# Patient Record
Sex: Female | Born: 1935 | Race: White | Hispanic: No | Smoking: Former smoker
Health system: Southern US, Community
[De-identification: ages and names within clinical notes are randomized; demographics above are authoritative.]

## PROBLEM LIST (undated history)

## (undated) DIAGNOSIS — C801 Malignant (primary) neoplasm, unspecified: Secondary | ICD-10-CM

## (undated) DIAGNOSIS — G4733 Obstructive sleep apnea (adult) (pediatric): Secondary | ICD-10-CM

## (undated) DIAGNOSIS — D649 Anemia, unspecified: Secondary | ICD-10-CM

## (undated) DIAGNOSIS — H8109 Meniere's disease, unspecified ear: Secondary | ICD-10-CM

## (undated) DIAGNOSIS — I2510 Atherosclerotic heart disease of native coronary artery without angina pectoris: Secondary | ICD-10-CM

## (undated) DIAGNOSIS — Z95828 Presence of other vascular implants and grafts: Secondary | ICD-10-CM

## (undated) DIAGNOSIS — F32A Depression, unspecified: Secondary | ICD-10-CM

## (undated) DIAGNOSIS — I219 Acute myocardial infarction, unspecified: Secondary | ICD-10-CM

## (undated) DIAGNOSIS — K589 Irritable bowel syndrome without diarrhea: Secondary | ICD-10-CM

## (undated) DIAGNOSIS — Z950 Presence of cardiac pacemaker: Secondary | ICD-10-CM

## (undated) DIAGNOSIS — Z992 Dependence on renal dialysis: Secondary | ICD-10-CM

## (undated) DIAGNOSIS — F329 Major depressive disorder, single episode, unspecified: Secondary | ICD-10-CM

## (undated) DIAGNOSIS — I509 Heart failure, unspecified: Secondary | ICD-10-CM

## (undated) DIAGNOSIS — I429 Cardiomyopathy, unspecified: Secondary | ICD-10-CM

## (undated) DIAGNOSIS — N19 Unspecified kidney failure: Secondary | ICD-10-CM

## (undated) DIAGNOSIS — F419 Anxiety disorder, unspecified: Secondary | ICD-10-CM

## (undated) DIAGNOSIS — M1990 Unspecified osteoarthritis, unspecified site: Secondary | ICD-10-CM

## (undated) DIAGNOSIS — N289 Disorder of kidney and ureter, unspecified: Secondary | ICD-10-CM

## (undated) HISTORY — PX: CATARACT EXTRACTION: SUR2

## (undated) HISTORY — PX: JOINT REPLACEMENT: SHX530

## (undated) HISTORY — DX: Heart failure, unspecified: I50.9

## (undated) HISTORY — PX: EYE SURGERY: SHX253

## (undated) HISTORY — DX: Unspecified kidney failure: N19

## (undated) HISTORY — PX: ABDOMINAL HYSTERECTOMY: SHX81

## (undated) HISTORY — DX: Meniere's disease, unspecified ear: H81.09

## (undated) HISTORY — PX: INSERT / REPLACE / REMOVE PACEMAKER: SUR710

## (undated) HISTORY — DX: Dependence on renal dialysis: Z99.2

## (undated) HISTORY — DX: Anemia, unspecified: D64.9

---
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
## 2003-12-07 ENCOUNTER — Ambulatory Visit: Payer: Self-pay | Admitting: Internal Medicine

## 2004-01-27 HISTORY — PX: PACEMAKER INSERTION: SHX728

## 2004-06-24 ENCOUNTER — Ambulatory Visit: Payer: Self-pay | Admitting: Ophthalmology

## 2004-07-01 ENCOUNTER — Ambulatory Visit: Payer: Self-pay | Admitting: Ophthalmology

## 2004-07-23 ENCOUNTER — Ambulatory Visit: Payer: Self-pay | Admitting: Internal Medicine

## 2004-10-08 ENCOUNTER — Ambulatory Visit: Payer: Self-pay | Admitting: Internal Medicine

## 2004-10-21 ENCOUNTER — Inpatient Hospital Stay: Payer: Self-pay | Admitting: Cardiology

## 2004-10-21 ENCOUNTER — Other Ambulatory Visit: Payer: Self-pay

## 2004-10-22 ENCOUNTER — Other Ambulatory Visit: Payer: Self-pay

## 2005-03-12 ENCOUNTER — Ambulatory Visit: Payer: Self-pay | Admitting: Internal Medicine

## 2005-06-30 ENCOUNTER — Ambulatory Visit: Payer: Self-pay | Admitting: Unknown Physician Specialty

## 2005-07-17 ENCOUNTER — Ambulatory Visit: Payer: Self-pay | Admitting: Surgery

## 2005-07-27 ENCOUNTER — Ambulatory Visit: Payer: Self-pay | Admitting: Surgery

## 2006-01-26 HISTORY — PX: TOTAL KNEE ARTHROPLASTY: SHX125

## 2006-05-11 ENCOUNTER — Ambulatory Visit: Payer: Self-pay | Admitting: Internal Medicine

## 2006-06-02 ENCOUNTER — Other Ambulatory Visit: Payer: Self-pay

## 2006-06-02 ENCOUNTER — Ambulatory Visit: Payer: Self-pay | Admitting: Unknown Physician Specialty

## 2006-06-15 ENCOUNTER — Inpatient Hospital Stay: Payer: Self-pay | Admitting: Unknown Physician Specialty

## 2007-04-30 ENCOUNTER — Emergency Department: Payer: Self-pay | Admitting: Emergency Medicine

## 2007-07-20 ENCOUNTER — Ambulatory Visit: Payer: Self-pay | Admitting: Internal Medicine

## 2007-10-11 ENCOUNTER — Ambulatory Visit: Payer: Self-pay | Admitting: Unknown Physician Specialty

## 2008-01-27 DIAGNOSIS — K589 Irritable bowel syndrome without diarrhea: Secondary | ICD-10-CM

## 2008-01-27 HISTORY — PX: CHOLECYSTECTOMY: SHX55

## 2008-01-27 HISTORY — PX: ANUS SURGERY: SHX302

## 2008-01-27 HISTORY — DX: Irritable bowel syndrome, unspecified: K58.9

## 2008-02-17 ENCOUNTER — Inpatient Hospital Stay: Payer: Self-pay | Admitting: Internal Medicine

## 2008-06-18 ENCOUNTER — Ambulatory Visit: Payer: Self-pay | Admitting: Internal Medicine

## 2008-08-23 ENCOUNTER — Ambulatory Visit: Payer: Self-pay | Admitting: Internal Medicine

## 2008-08-28 ENCOUNTER — Ambulatory Visit: Payer: Self-pay | Admitting: Internal Medicine

## 2009-03-01 ENCOUNTER — Ambulatory Visit: Payer: Self-pay | Admitting: Internal Medicine

## 2009-07-23 ENCOUNTER — Ambulatory Visit: Payer: Self-pay | Admitting: Ophthalmology

## 2009-07-30 ENCOUNTER — Ambulatory Visit: Payer: Self-pay | Admitting: Ophthalmology

## 2009-08-01 LAB — HM COLONOSCOPY: HM Colonoscopy: NORMAL

## 2009-11-08 ENCOUNTER — Ambulatory Visit: Payer: Self-pay | Admitting: Internal Medicine

## 2010-06-25 ENCOUNTER — Ambulatory Visit: Payer: Self-pay | Admitting: Internal Medicine

## 2010-07-27 DIAGNOSIS — N19 Unspecified kidney failure: Secondary | ICD-10-CM

## 2010-07-27 HISTORY — PX: INSERTION OF DIALYSIS CATHETER: SHX1324

## 2010-07-27 HISTORY — DX: Unspecified kidney failure: N19

## 2010-08-04 ENCOUNTER — Ambulatory Visit: Payer: Self-pay | Admitting: Vascular Surgery

## 2011-01-07 ENCOUNTER — Ambulatory Visit: Payer: Self-pay | Admitting: Internal Medicine

## 2011-01-27 DIAGNOSIS — N186 End stage renal disease: Secondary | ICD-10-CM | POA: Diagnosis not present

## 2011-01-27 DIAGNOSIS — D509 Iron deficiency anemia, unspecified: Secondary | ICD-10-CM | POA: Diagnosis not present

## 2011-01-28 DIAGNOSIS — N186 End stage renal disease: Secondary | ICD-10-CM | POA: Diagnosis not present

## 2011-01-28 DIAGNOSIS — D509 Iron deficiency anemia, unspecified: Secondary | ICD-10-CM | POA: Diagnosis not present

## 2011-01-29 DIAGNOSIS — D509 Iron deficiency anemia, unspecified: Secondary | ICD-10-CM | POA: Diagnosis not present

## 2011-01-29 DIAGNOSIS — N186 End stage renal disease: Secondary | ICD-10-CM | POA: Diagnosis not present

## 2011-01-30 DIAGNOSIS — D509 Iron deficiency anemia, unspecified: Secondary | ICD-10-CM | POA: Diagnosis not present

## 2011-01-30 DIAGNOSIS — N186 End stage renal disease: Secondary | ICD-10-CM | POA: Diagnosis not present

## 2011-01-31 DIAGNOSIS — N186 End stage renal disease: Secondary | ICD-10-CM | POA: Diagnosis not present

## 2011-01-31 DIAGNOSIS — D509 Iron deficiency anemia, unspecified: Secondary | ICD-10-CM | POA: Diagnosis not present

## 2011-02-01 DIAGNOSIS — D509 Iron deficiency anemia, unspecified: Secondary | ICD-10-CM | POA: Diagnosis not present

## 2011-02-01 DIAGNOSIS — N186 End stage renal disease: Secondary | ICD-10-CM | POA: Diagnosis not present

## 2011-02-02 DIAGNOSIS — D509 Iron deficiency anemia, unspecified: Secondary | ICD-10-CM | POA: Diagnosis not present

## 2011-02-02 DIAGNOSIS — N186 End stage renal disease: Secondary | ICD-10-CM | POA: Diagnosis not present

## 2011-02-03 DIAGNOSIS — N186 End stage renal disease: Secondary | ICD-10-CM | POA: Diagnosis not present

## 2011-02-03 DIAGNOSIS — D509 Iron deficiency anemia, unspecified: Secondary | ICD-10-CM | POA: Diagnosis not present

## 2011-02-04 DIAGNOSIS — D509 Iron deficiency anemia, unspecified: Secondary | ICD-10-CM | POA: Diagnosis not present

## 2011-02-04 DIAGNOSIS — N186 End stage renal disease: Secondary | ICD-10-CM | POA: Diagnosis not present

## 2011-02-05 DIAGNOSIS — N186 End stage renal disease: Secondary | ICD-10-CM | POA: Diagnosis not present

## 2011-02-05 DIAGNOSIS — D509 Iron deficiency anemia, unspecified: Secondary | ICD-10-CM | POA: Diagnosis not present

## 2011-02-06 DIAGNOSIS — D509 Iron deficiency anemia, unspecified: Secondary | ICD-10-CM | POA: Diagnosis not present

## 2011-02-06 DIAGNOSIS — N186 End stage renal disease: Secondary | ICD-10-CM | POA: Diagnosis not present

## 2011-02-07 DIAGNOSIS — N186 End stage renal disease: Secondary | ICD-10-CM | POA: Diagnosis not present

## 2011-02-07 DIAGNOSIS — D509 Iron deficiency anemia, unspecified: Secondary | ICD-10-CM | POA: Diagnosis not present

## 2011-02-08 DIAGNOSIS — N186 End stage renal disease: Secondary | ICD-10-CM | POA: Diagnosis not present

## 2011-02-08 DIAGNOSIS — D509 Iron deficiency anemia, unspecified: Secondary | ICD-10-CM | POA: Diagnosis not present

## 2011-02-09 DIAGNOSIS — N186 End stage renal disease: Secondary | ICD-10-CM | POA: Diagnosis not present

## 2011-02-09 DIAGNOSIS — D509 Iron deficiency anemia, unspecified: Secondary | ICD-10-CM | POA: Diagnosis not present

## 2011-02-10 DIAGNOSIS — N186 End stage renal disease: Secondary | ICD-10-CM | POA: Diagnosis not present

## 2011-02-10 DIAGNOSIS — D509 Iron deficiency anemia, unspecified: Secondary | ICD-10-CM | POA: Diagnosis not present

## 2011-02-11 DIAGNOSIS — N186 End stage renal disease: Secondary | ICD-10-CM | POA: Diagnosis not present

## 2011-02-11 DIAGNOSIS — D509 Iron deficiency anemia, unspecified: Secondary | ICD-10-CM | POA: Diagnosis not present

## 2011-02-12 DIAGNOSIS — N186 End stage renal disease: Secondary | ICD-10-CM | POA: Diagnosis not present

## 2011-02-12 DIAGNOSIS — D509 Iron deficiency anemia, unspecified: Secondary | ICD-10-CM | POA: Diagnosis not present

## 2011-02-13 DIAGNOSIS — D509 Iron deficiency anemia, unspecified: Secondary | ICD-10-CM | POA: Diagnosis not present

## 2011-02-13 DIAGNOSIS — N186 End stage renal disease: Secondary | ICD-10-CM | POA: Diagnosis not present

## 2011-02-14 DIAGNOSIS — D509 Iron deficiency anemia, unspecified: Secondary | ICD-10-CM | POA: Diagnosis not present

## 2011-02-14 DIAGNOSIS — N186 End stage renal disease: Secondary | ICD-10-CM | POA: Diagnosis not present

## 2011-02-15 DIAGNOSIS — D509 Iron deficiency anemia, unspecified: Secondary | ICD-10-CM | POA: Diagnosis not present

## 2011-02-15 DIAGNOSIS — N186 End stage renal disease: Secondary | ICD-10-CM | POA: Diagnosis not present

## 2011-02-16 DIAGNOSIS — N186 End stage renal disease: Secondary | ICD-10-CM | POA: Diagnosis not present

## 2011-02-16 DIAGNOSIS — D509 Iron deficiency anemia, unspecified: Secondary | ICD-10-CM | POA: Diagnosis not present

## 2011-02-17 DIAGNOSIS — D509 Iron deficiency anemia, unspecified: Secondary | ICD-10-CM | POA: Diagnosis not present

## 2011-02-17 DIAGNOSIS — N186 End stage renal disease: Secondary | ICD-10-CM | POA: Diagnosis not present

## 2011-02-18 DIAGNOSIS — D509 Iron deficiency anemia, unspecified: Secondary | ICD-10-CM | POA: Diagnosis not present

## 2011-02-18 DIAGNOSIS — N186 End stage renal disease: Secondary | ICD-10-CM | POA: Diagnosis not present

## 2011-02-19 DIAGNOSIS — N186 End stage renal disease: Secondary | ICD-10-CM | POA: Diagnosis not present

## 2011-02-19 DIAGNOSIS — D509 Iron deficiency anemia, unspecified: Secondary | ICD-10-CM | POA: Diagnosis not present

## 2011-02-20 DIAGNOSIS — D509 Iron deficiency anemia, unspecified: Secondary | ICD-10-CM | POA: Diagnosis not present

## 2011-02-20 DIAGNOSIS — N186 End stage renal disease: Secondary | ICD-10-CM | POA: Diagnosis not present

## 2011-02-21 DIAGNOSIS — D509 Iron deficiency anemia, unspecified: Secondary | ICD-10-CM | POA: Diagnosis not present

## 2011-02-21 DIAGNOSIS — N186 End stage renal disease: Secondary | ICD-10-CM | POA: Diagnosis not present

## 2011-02-22 DIAGNOSIS — N186 End stage renal disease: Secondary | ICD-10-CM | POA: Diagnosis not present

## 2011-02-22 DIAGNOSIS — D509 Iron deficiency anemia, unspecified: Secondary | ICD-10-CM | POA: Diagnosis not present

## 2011-02-23 DIAGNOSIS — D509 Iron deficiency anemia, unspecified: Secondary | ICD-10-CM | POA: Diagnosis not present

## 2011-02-23 DIAGNOSIS — N186 End stage renal disease: Secondary | ICD-10-CM | POA: Diagnosis not present

## 2011-02-24 DIAGNOSIS — D509 Iron deficiency anemia, unspecified: Secondary | ICD-10-CM | POA: Diagnosis not present

## 2011-02-24 DIAGNOSIS — N186 End stage renal disease: Secondary | ICD-10-CM | POA: Diagnosis not present

## 2011-02-25 DIAGNOSIS — N186 End stage renal disease: Secondary | ICD-10-CM | POA: Diagnosis not present

## 2011-02-25 DIAGNOSIS — D509 Iron deficiency anemia, unspecified: Secondary | ICD-10-CM | POA: Diagnosis not present

## 2011-02-26 DIAGNOSIS — D509 Iron deficiency anemia, unspecified: Secondary | ICD-10-CM | POA: Diagnosis not present

## 2011-02-26 DIAGNOSIS — N186 End stage renal disease: Secondary | ICD-10-CM | POA: Diagnosis not present

## 2011-02-27 DIAGNOSIS — N186 End stage renal disease: Secondary | ICD-10-CM | POA: Diagnosis not present

## 2011-02-27 DIAGNOSIS — D509 Iron deficiency anemia, unspecified: Secondary | ICD-10-CM | POA: Diagnosis not present

## 2011-02-28 DIAGNOSIS — N186 End stage renal disease: Secondary | ICD-10-CM | POA: Diagnosis not present

## 2011-02-28 DIAGNOSIS — D509 Iron deficiency anemia, unspecified: Secondary | ICD-10-CM | POA: Diagnosis not present

## 2011-03-01 DIAGNOSIS — D509 Iron deficiency anemia, unspecified: Secondary | ICD-10-CM | POA: Diagnosis not present

## 2011-03-01 DIAGNOSIS — N186 End stage renal disease: Secondary | ICD-10-CM | POA: Diagnosis not present

## 2011-03-02 DIAGNOSIS — N186 End stage renal disease: Secondary | ICD-10-CM | POA: Diagnosis not present

## 2011-03-02 DIAGNOSIS — D509 Iron deficiency anemia, unspecified: Secondary | ICD-10-CM | POA: Diagnosis not present

## 2011-03-03 DIAGNOSIS — N186 End stage renal disease: Secondary | ICD-10-CM | POA: Diagnosis not present

## 2011-03-03 DIAGNOSIS — D509 Iron deficiency anemia, unspecified: Secondary | ICD-10-CM | POA: Diagnosis not present

## 2011-03-04 DIAGNOSIS — D509 Iron deficiency anemia, unspecified: Secondary | ICD-10-CM | POA: Diagnosis not present

## 2011-03-04 DIAGNOSIS — N186 End stage renal disease: Secondary | ICD-10-CM | POA: Diagnosis not present

## 2011-03-05 DIAGNOSIS — N186 End stage renal disease: Secondary | ICD-10-CM | POA: Diagnosis not present

## 2011-03-05 DIAGNOSIS — D509 Iron deficiency anemia, unspecified: Secondary | ICD-10-CM | POA: Diagnosis not present

## 2011-03-06 DIAGNOSIS — D509 Iron deficiency anemia, unspecified: Secondary | ICD-10-CM | POA: Diagnosis not present

## 2011-03-06 DIAGNOSIS — N186 End stage renal disease: Secondary | ICD-10-CM | POA: Diagnosis not present

## 2011-03-07 DIAGNOSIS — D509 Iron deficiency anemia, unspecified: Secondary | ICD-10-CM | POA: Diagnosis not present

## 2011-03-07 DIAGNOSIS — N186 End stage renal disease: Secondary | ICD-10-CM | POA: Diagnosis not present

## 2011-03-08 DIAGNOSIS — D509 Iron deficiency anemia, unspecified: Secondary | ICD-10-CM | POA: Diagnosis not present

## 2011-03-08 DIAGNOSIS — N186 End stage renal disease: Secondary | ICD-10-CM | POA: Diagnosis not present

## 2011-03-09 DIAGNOSIS — D509 Iron deficiency anemia, unspecified: Secondary | ICD-10-CM | POA: Diagnosis not present

## 2011-03-09 DIAGNOSIS — N186 End stage renal disease: Secondary | ICD-10-CM | POA: Diagnosis not present

## 2011-03-10 DIAGNOSIS — D509 Iron deficiency anemia, unspecified: Secondary | ICD-10-CM | POA: Diagnosis not present

## 2011-03-10 DIAGNOSIS — N186 End stage renal disease: Secondary | ICD-10-CM | POA: Diagnosis not present

## 2011-03-11 DIAGNOSIS — D509 Iron deficiency anemia, unspecified: Secondary | ICD-10-CM | POA: Diagnosis not present

## 2011-03-11 DIAGNOSIS — N186 End stage renal disease: Secondary | ICD-10-CM | POA: Diagnosis not present

## 2011-03-12 DIAGNOSIS — N186 End stage renal disease: Secondary | ICD-10-CM | POA: Diagnosis not present

## 2011-03-12 DIAGNOSIS — D509 Iron deficiency anemia, unspecified: Secondary | ICD-10-CM | POA: Diagnosis not present

## 2011-03-13 DIAGNOSIS — N186 End stage renal disease: Secondary | ICD-10-CM | POA: Diagnosis not present

## 2011-03-13 DIAGNOSIS — D509 Iron deficiency anemia, unspecified: Secondary | ICD-10-CM | POA: Diagnosis not present

## 2011-03-14 DIAGNOSIS — D509 Iron deficiency anemia, unspecified: Secondary | ICD-10-CM | POA: Diagnosis not present

## 2011-03-14 DIAGNOSIS — N186 End stage renal disease: Secondary | ICD-10-CM | POA: Diagnosis not present

## 2011-03-15 DIAGNOSIS — D509 Iron deficiency anemia, unspecified: Secondary | ICD-10-CM | POA: Diagnosis not present

## 2011-03-15 DIAGNOSIS — N186 End stage renal disease: Secondary | ICD-10-CM | POA: Diagnosis not present

## 2011-03-16 DIAGNOSIS — N186 End stage renal disease: Secondary | ICD-10-CM | POA: Diagnosis not present

## 2011-03-16 DIAGNOSIS — D509 Iron deficiency anemia, unspecified: Secondary | ICD-10-CM | POA: Diagnosis not present

## 2011-03-17 DIAGNOSIS — D509 Iron deficiency anemia, unspecified: Secondary | ICD-10-CM | POA: Diagnosis not present

## 2011-03-17 DIAGNOSIS — N186 End stage renal disease: Secondary | ICD-10-CM | POA: Diagnosis not present

## 2011-03-18 DIAGNOSIS — D509 Iron deficiency anemia, unspecified: Secondary | ICD-10-CM | POA: Diagnosis not present

## 2011-03-18 DIAGNOSIS — N186 End stage renal disease: Secondary | ICD-10-CM | POA: Diagnosis not present

## 2011-03-19 DIAGNOSIS — N186 End stage renal disease: Secondary | ICD-10-CM | POA: Diagnosis not present

## 2011-03-19 DIAGNOSIS — D509 Iron deficiency anemia, unspecified: Secondary | ICD-10-CM | POA: Diagnosis not present

## 2011-03-20 DIAGNOSIS — D509 Iron deficiency anemia, unspecified: Secondary | ICD-10-CM | POA: Diagnosis not present

## 2011-03-20 DIAGNOSIS — N186 End stage renal disease: Secondary | ICD-10-CM | POA: Diagnosis not present

## 2011-03-21 DIAGNOSIS — D509 Iron deficiency anemia, unspecified: Secondary | ICD-10-CM | POA: Diagnosis not present

## 2011-03-21 DIAGNOSIS — N186 End stage renal disease: Secondary | ICD-10-CM | POA: Diagnosis not present

## 2011-03-22 DIAGNOSIS — N186 End stage renal disease: Secondary | ICD-10-CM | POA: Diagnosis not present

## 2011-03-22 DIAGNOSIS — D509 Iron deficiency anemia, unspecified: Secondary | ICD-10-CM | POA: Diagnosis not present

## 2011-03-23 DIAGNOSIS — D509 Iron deficiency anemia, unspecified: Secondary | ICD-10-CM | POA: Diagnosis not present

## 2011-03-23 DIAGNOSIS — N186 End stage renal disease: Secondary | ICD-10-CM | POA: Diagnosis not present

## 2011-03-24 DIAGNOSIS — D509 Iron deficiency anemia, unspecified: Secondary | ICD-10-CM | POA: Diagnosis not present

## 2011-03-24 DIAGNOSIS — N186 End stage renal disease: Secondary | ICD-10-CM | POA: Diagnosis not present

## 2011-03-25 DIAGNOSIS — D509 Iron deficiency anemia, unspecified: Secondary | ICD-10-CM | POA: Diagnosis not present

## 2011-03-25 DIAGNOSIS — N186 End stage renal disease: Secondary | ICD-10-CM | POA: Diagnosis not present

## 2011-03-27 DIAGNOSIS — N186 End stage renal disease: Secondary | ICD-10-CM | POA: Diagnosis not present

## 2011-03-27 DIAGNOSIS — D509 Iron deficiency anemia, unspecified: Secondary | ICD-10-CM | POA: Diagnosis not present

## 2011-03-27 DIAGNOSIS — N2581 Secondary hyperparathyroidism of renal origin: Secondary | ICD-10-CM | POA: Diagnosis not present

## 2011-03-28 DIAGNOSIS — N2581 Secondary hyperparathyroidism of renal origin: Secondary | ICD-10-CM | POA: Diagnosis not present

## 2011-03-28 DIAGNOSIS — N186 End stage renal disease: Secondary | ICD-10-CM | POA: Diagnosis not present

## 2011-03-28 DIAGNOSIS — D509 Iron deficiency anemia, unspecified: Secondary | ICD-10-CM | POA: Diagnosis not present

## 2011-03-29 DIAGNOSIS — D509 Iron deficiency anemia, unspecified: Secondary | ICD-10-CM | POA: Diagnosis not present

## 2011-03-29 DIAGNOSIS — N186 End stage renal disease: Secondary | ICD-10-CM | POA: Diagnosis not present

## 2011-03-29 DIAGNOSIS — N2581 Secondary hyperparathyroidism of renal origin: Secondary | ICD-10-CM | POA: Diagnosis not present

## 2011-03-30 DIAGNOSIS — N2581 Secondary hyperparathyroidism of renal origin: Secondary | ICD-10-CM | POA: Diagnosis not present

## 2011-03-30 DIAGNOSIS — N186 End stage renal disease: Secondary | ICD-10-CM | POA: Diagnosis not present

## 2011-03-30 DIAGNOSIS — D509 Iron deficiency anemia, unspecified: Secondary | ICD-10-CM | POA: Diagnosis not present

## 2011-04-01 ENCOUNTER — Encounter: Payer: Self-pay | Admitting: Internal Medicine

## 2011-04-01 ENCOUNTER — Ambulatory Visit (INDEPENDENT_AMBULATORY_CARE_PROVIDER_SITE_OTHER): Payer: Medicare Other | Admitting: Internal Medicine

## 2011-04-01 VITALS — BP 120/60 | HR 86 | Temp 98.3°F | Wt 131.0 lb

## 2011-04-01 DIAGNOSIS — N185 Chronic kidney disease, stage 5: Secondary | ICD-10-CM | POA: Diagnosis not present

## 2011-04-01 DIAGNOSIS — Z1239 Encounter for other screening for malignant neoplasm of breast: Secondary | ICD-10-CM

## 2011-04-01 DIAGNOSIS — F329 Major depressive disorder, single episode, unspecified: Secondary | ICD-10-CM

## 2011-04-01 DIAGNOSIS — N959 Unspecified menopausal and perimenopausal disorder: Secondary | ICD-10-CM

## 2011-04-01 DIAGNOSIS — D509 Iron deficiency anemia, unspecified: Secondary | ICD-10-CM | POA: Diagnosis not present

## 2011-04-01 DIAGNOSIS — I10 Essential (primary) hypertension: Secondary | ICD-10-CM | POA: Diagnosis not present

## 2011-04-01 DIAGNOSIS — N186 End stage renal disease: Secondary | ICD-10-CM | POA: Diagnosis not present

## 2011-04-01 DIAGNOSIS — N2581 Secondary hyperparathyroidism of renal origin: Secondary | ICD-10-CM | POA: Diagnosis not present

## 2011-04-01 MED ORDER — TRIAMTERENE-HCTZ 75-50 MG PO TABS: 1.0000 | ORAL_TABLET | Freq: Two times a day (BID) | ORAL | Status: DC

## 2011-04-01 MED ORDER — METOPROLOL SUCCINATE ER 25 MG PO TB24: 25.0000 mg | ORAL_TABLET | Freq: Every day | ORAL | Status: DC

## 2011-04-01 MED ORDER — SERTRALINE HCL 100 MG PO TABS: 50.0000 mg | ORAL_TABLET | Freq: Every day | ORAL | Status: DC

## 2011-04-01 MED ORDER — BUPROPION HCL ER (SR) 150 MG PO TB12: 150.0000 mg | ORAL_TABLET | Freq: Two times a day (BID) | ORAL | Status: DC

## 2011-04-01 MED ORDER — ESTROGENS CONJUGATED 0.3 MG PO TABS: ORAL_TABLET | ORAL | Status: DC

## 2011-04-01 NOTE — Assessment & Plan Note (Signed)
Will obtain records from her nephrologist. She will continue his peritoneal dialysis. Review of recent labs shows normal electrolytes. She will followup in one month.

## 2011-04-01 NOTE — Assessment & Plan Note (Addendum)
Severe. She would like to taper off her current medications and limit use of new medications. Will start by reducing dose of sertraline to 50 mg daily. She will continue Wellbutrin for now. Strongly encouraged her to consider counseling. Gave her information on a local counselor for her to contact. She will followup here in one month or sooner as needed.

## 2011-04-01 NOTE — Assessment & Plan Note (Signed)
Patient reports that she has tried tapering off Premarin in the past. She reports significant increase in fatigue, depression, and hot flashes with this. We'll plan to continue current dose of Premarin.

## 2011-04-01 NOTE — Progress Notes (Signed)
Subjective:    Patient ID: Sharon [name], female    DOB: September 12, 1926, 78 y.o.   MRN: [id number]  HPI 78YO female with history of hypertension, chronic kidney disease stage 5, and depression presents to establish care. She reports that she was diagnosed with kidney failure in July of 2012. Prior to that, she had been noted to have elevated creatinine near 3 for several years. For several months prior to July, she had worsening health with fatigue, malaise, weight loss, decreased memory, and decreased urine output. She reports that this was attributed to stress. Ultimately, in July she was seen by a nephrologist and almost immediately started on peritoneal dialysis. She reports that there was no time for kidney biopsy prior to starting dialysis and so etiology of her kidney failure was not determined. She reports that she has done well on peritoneal dialysis and has not had any problems with her catheter. She currently administers peritoneal dialysis at night.   During this time, in 03-Feb-2011, her husband passed away. She reports that the combination of her illness and dealing with her husband's death have led to increase in depressive symptoms. She notes that she has been "prone to depression "throughout her life and has been treated with medication in the past. She has been on sertraline and Wellbutrin for many years. Since her husband's death, she reports increase in fatigue and increase in sleeping. She finds that she is not interested in doing activities that she used to enjoy with her friends are left once. She is frequently tearful. She has not sought counseling for this. She notes that counseling was helpful for her in the distant past. She is interested in tapering off her current medication and limiting use of medication as it has not helped improve her depressive symptoms.  Outpatient Encounter Prescriptions as of 04/01/2011  Medication Sig Dispense Refill  . buPROPion (WELLBUTRIN SR) 150 MG  12 hr tablet Take 1 tablet (150 mg total) by mouth 2 (two) times daily.  60 tablet  3  . estrogens, conjugated, (PREMARIN) 0.3 MG tablet Take 1 pill daily  30 tablet  3  . folic acid-vitamin b complex-vitamin c-selenium-zinc (DIALYVITE) 3 MG TABS Take 1 tablet by mouth daily.      . metoprolol succinate (TOPROL-XL) 25 MG 24 hr tablet Take 1 tablet (25 mg total) by mouth daily.  30 tablet  6  . sertraline (ZOLOFT) 100 MG tablet Take 0.5 tablets (50 mg total) by mouth daily.  30 tablet  3  . triamterene-hydrochlorothiazide (MAXZIDE) 75-50 MG per tablet Take 1 tablet by mouth 2 (two) times daily.  60 tablet  6    Review of Systems  Constitutional: Negative for fever, chills, appetite change, fatigue and unexpected weight change.  HENT: Positive for hearing loss. Negative for ear pain, congestion, sore throat, trouble swallowing, neck pain, voice change and sinus pressure.   Eyes: Negative for visual disturbance.  Respiratory: Negative for cough, shortness of breath, wheezing and stridor.   Cardiovascular: Negative for chest pain, palpitations and leg swelling.  Gastrointestinal: Negative for nausea, vomiting, abdominal pain, diarrhea, constipation, blood in stool, abdominal distention and anal bleeding.  Genitourinary: Positive for decreased urine volume. Negative for dysuria and flank pain.  Musculoskeletal: Negative for myalgias, arthralgias and gait problem.  Skin: Negative for color change and rash.  Neurological: Negative for dizziness and headaches.  Hematological: Negative for adenopathy. Does not bruise/bleed easily.  Psychiatric/Behavioral: Positive for sleep disturbance, dysphoric mood and decreased concentration. Negative for  suicidal ideas. The patient is not nervous/anxious.    BP 120/60  Pulse 86  Temp(Src) 98.3 F (36.8 C) (Oral)  Wt 131 lb (59.421 kg)  SpO2 99%     Objective:   Physical Exam  Constitutional: She is oriented to person, place, and time. She appears  well-developed and well-nourished. No distress.  HENT:  Head: Normocephalic and atraumatic.  Right Ear: External ear normal.  Left Ear: External ear normal.  Nose: Nose normal.  Mouth/Throat: Oropharynx is clear and moist. No oropharyngeal exudate.  Eyes: Conjunctivae are normal. Pupils are equal, round, and reactive to light. Right eye exhibits no discharge. Left eye exhibits no discharge. No scleral icterus.  Neck: Normal range of motion. Neck supple. No tracheal deviation present. No thyromegaly present.  Cardiovascular: Normal rate, regular rhythm, normal heart sounds and intact distal pulses.  Exam reveals no gallop and no friction rub.   No murmur heard. Pulmonary/Chest: Effort normal and breath sounds normal. No respiratory distress. She has no wheezes. She has no rales. She exhibits no tenderness.  Abdominal: Soft. Bowel sounds are normal. She exhibits no distension and no mass. There is no hepatosplenomegaly. There is no tenderness. There is no rebound and no guarding.    Musculoskeletal: Normal range of motion. She exhibits no edema and no tenderness.  Lymphadenopathy:    She has no cervical adenopathy.  Neurological: She is alert and oriented to person, place, and time. No cranial nerve deficit or sensory deficit. She exhibits normal muscle tone. Coordination normal.  Skin: Skin is warm and dry. No rash noted. She is not diaphoretic. No erythema. No pallor.  Psychiatric: Her speech is normal and behavior is normal. Judgment and thought content normal. Cognition and memory are normal. She exhibits a depressed mood.          Assessment & Plan:

## 2011-04-01 NOTE — Assessment & Plan Note (Signed)
Blood pressure is well-controlled today. Will request records on previous evaluation and management. Followup in one month.

## 2011-04-01 NOTE — Assessment & Plan Note (Signed)
Mammogram ordered today 

## 2011-04-02 DIAGNOSIS — N2581 Secondary hyperparathyroidism of renal origin: Secondary | ICD-10-CM | POA: Diagnosis not present

## 2011-04-02 DIAGNOSIS — D509 Iron deficiency anemia, unspecified: Secondary | ICD-10-CM | POA: Diagnosis not present

## 2011-04-02 DIAGNOSIS — N186 End stage renal disease: Secondary | ICD-10-CM | POA: Diagnosis not present

## 2011-04-03 DIAGNOSIS — N186 End stage renal disease: Secondary | ICD-10-CM | POA: Diagnosis not present

## 2011-04-03 DIAGNOSIS — N2581 Secondary hyperparathyroidism of renal origin: Secondary | ICD-10-CM | POA: Diagnosis not present

## 2011-04-03 DIAGNOSIS — D509 Iron deficiency anemia, unspecified: Secondary | ICD-10-CM | POA: Diagnosis not present

## 2011-04-04 DIAGNOSIS — D509 Iron deficiency anemia, unspecified: Secondary | ICD-10-CM | POA: Diagnosis not present

## 2011-04-04 DIAGNOSIS — N186 End stage renal disease: Secondary | ICD-10-CM | POA: Diagnosis not present

## 2011-04-04 DIAGNOSIS — N2581 Secondary hyperparathyroidism of renal origin: Secondary | ICD-10-CM | POA: Diagnosis not present

## 2011-04-05 DIAGNOSIS — N186 End stage renal disease: Secondary | ICD-10-CM | POA: Diagnosis not present

## 2011-04-05 DIAGNOSIS — D509 Iron deficiency anemia, unspecified: Secondary | ICD-10-CM | POA: Diagnosis not present

## 2011-04-05 DIAGNOSIS — N2581 Secondary hyperparathyroidism of renal origin: Secondary | ICD-10-CM | POA: Diagnosis not present

## 2011-04-06 DIAGNOSIS — N2581 Secondary hyperparathyroidism of renal origin: Secondary | ICD-10-CM | POA: Diagnosis not present

## 2011-04-06 DIAGNOSIS — D509 Iron deficiency anemia, unspecified: Secondary | ICD-10-CM | POA: Diagnosis not present

## 2011-04-06 DIAGNOSIS — N186 End stage renal disease: Secondary | ICD-10-CM | POA: Diagnosis not present

## 2011-04-07 DIAGNOSIS — D509 Iron deficiency anemia, unspecified: Secondary | ICD-10-CM | POA: Diagnosis not present

## 2011-04-07 DIAGNOSIS — N2581 Secondary hyperparathyroidism of renal origin: Secondary | ICD-10-CM | POA: Diagnosis not present

## 2011-04-07 DIAGNOSIS — N186 End stage renal disease: Secondary | ICD-10-CM | POA: Diagnosis not present

## 2011-04-08 DIAGNOSIS — D509 Iron deficiency anemia, unspecified: Secondary | ICD-10-CM | POA: Diagnosis not present

## 2011-04-08 DIAGNOSIS — N2581 Secondary hyperparathyroidism of renal origin: Secondary | ICD-10-CM | POA: Diagnosis not present

## 2011-04-08 DIAGNOSIS — N186 End stage renal disease: Secondary | ICD-10-CM | POA: Diagnosis not present

## 2011-04-28 ENCOUNTER — Ambulatory Visit: Payer: Self-pay | Admitting: Internal Medicine

## 2011-04-28 DIAGNOSIS — R928 Other abnormal and inconclusive findings on diagnostic imaging of breast: Secondary | ICD-10-CM | POA: Diagnosis not present

## 2011-04-28 DIAGNOSIS — Z1231 Encounter for screening mammogram for malignant neoplasm of breast: Secondary | ICD-10-CM | POA: Diagnosis not present

## 2011-05-01 ENCOUNTER — Ambulatory Visit (INDEPENDENT_AMBULATORY_CARE_PROVIDER_SITE_OTHER): Payer: Medicare Other | Admitting: Internal Medicine

## 2011-05-01 ENCOUNTER — Encounter: Payer: Self-pay | Admitting: Internal Medicine

## 2011-05-01 VITALS — BP 122/70 | HR 81 | Temp 98.4°F | Ht 64.0 in | Wt 132.0 lb

## 2011-05-01 DIAGNOSIS — M25511 Pain in right shoulder: Secondary | ICD-10-CM

## 2011-05-01 DIAGNOSIS — M25519 Pain in unspecified shoulder: Secondary | ICD-10-CM | POA: Diagnosis not present

## 2011-05-01 DIAGNOSIS — N185 Chronic kidney disease, stage 5: Secondary | ICD-10-CM

## 2011-05-01 DIAGNOSIS — F329 Major depressive disorder, single episode, unspecified: Secondary | ICD-10-CM

## 2011-05-01 MED ORDER — HYDROCODONE-ACETAMINOPHEN 5-500 MG PO TABS
1.0000 | ORAL_TABLET | Freq: Three times a day (TID) | ORAL | Status: AC | PRN
Start: 2011-05-01 — End: 2011-05-11

## 2011-05-01 MED ORDER — SERTRALINE HCL 25 MG PO TABS: 25.0000 mg | ORAL_TABLET | Freq: Every day | ORAL | Status: DC

## 2011-05-01 NOTE — Patient Instructions (Signed)
Decrease Zoloft to 25mg  daily. Stop after 3 weeks.  Follow up 3 months or sooner if any problems.

## 2011-05-01 NOTE — Progress Notes (Signed)
Subjective:    Patient ID: Sharon [name], female    DOB: 1926/03/27, 78 y.o.   MRN: [id number]  HPI 78 year old female with history of depression and chronic kidney disease stage V presents for followup. In regards to her depression, she reports slight improvement in her symptoms. She did not seek out counseling. She has tapered her Zoloft to 50 mg daily with no worsening of her symptoms. She would like to continue to taper off this medication. She notes that she is more active recently dissipating in activities of friends and family.  In regards to her chronic kidney disease she notes she recently had an unexpected improvement in her kidney function and her nephrologist has temporarily stopped her peritoneal dialysis. He is closely following her labs and she has an appointment scheduled for next week. She denies any decreased urinary output, shortness of breath, or lower extremity swelling. She reports that she is feeling well.  She reports a history of chronic pain in her right shoulder. She gets improvement in her symptoms with the use of nonsteroidals but has avoided these medications because of kidney function. She occasionally takes hydrocodone at night for severe pain. Pain has been attributed to arthritis in the past. She has no known injury to her right shoulder. She denies any weakness or problems with movement of her right arm. She denies numbness in her right arm.  Outpatient Encounter Prescriptions as of 05/01/2011  Medication Sig Dispense Refill  . buPROPion (WELLBUTRIN SR) 150 MG 12 hr tablet Take 1 tablet (150 mg total) by mouth 2 (two) times daily.  60 tablet  3  . estrogens, conjugated, (PREMARIN) 0.3 MG tablet Take 1 pill daily  30 tablet  3  . folic acid-vitamin b complex-vitamin c-selenium-zinc (DIALYVITE) 3 MG TABS Take 1 tablet by mouth daily.      . metoprolol succinate (TOPROL-XL) 25 MG 24 hr tablet Take 1 tablet (25 mg total) by mouth daily.  30 tablet  6  . sertraline  (ZOLOFT) 25 MG tablet Take 1 tablet (25 mg total) by mouth daily.  30 tablet  3  . triamterene-hydrochlorothiazide (MAXZIDE) 75-50 MG per tablet Take 1 tablet by mouth 2 (two) times daily.  60 tablet  6  . DISCONTD: sertraline (ZOLOFT) 100 MG tablet Take 0.5 tablets (50 mg total) by mouth daily.  30 tablet  3  . HYDROcodone-acetaminophen (VICODIN) 5-500 MG per tablet Take 1 tablet by mouth every 8 (eight) hours as needed for pain.  60 tablet  0    Review of Systems  Constitutional: Negative for fever, chills and fatigue.  Respiratory: Negative for shortness of breath.   Cardiovascular: Negative for chest pain.  Gastrointestinal: Negative for abdominal pain.  Psychiatric/Behavioral: Positive for dysphoric mood. The patient is not nervous/anxious.    BP 122/70  Pulse 81  Temp(Src) 98.4 F (36.9 C) (Oral)  Ht 5\' 4"  (1.626 m)  Wt 132 lb (59.875 kg)  BMI 22.66 kg/m2  SpO2 99%     Objective:   Physical Exam  Constitutional: She is oriented to person, place, and time. She appears well-developed and well-nourished. No distress.  HENT:  Head: Normocephalic and atraumatic.  Right Ear: External ear normal.  Left Ear: External ear normal.  Nose: Nose normal.  Mouth/Throat: Oropharynx is clear and moist. No oropharyngeal exudate.  Eyes: Conjunctivae are normal. Pupils are equal, round, and reactive to light. Right eye exhibits no discharge. Left eye exhibits no discharge. No scleral icterus.  Neck: Normal range of motion.  Neck supple. No tracheal deviation present. No thyromegaly present.  Cardiovascular: Normal rate, regular rhythm, normal heart sounds and intact distal pulses.  Exam reveals no gallop and no friction rub.   No murmur heard. Pulmonary/Chest: Effort normal and breath sounds normal. No respiratory distress. She has no wheezes. She has no rales. She exhibits no tenderness.  Musculoskeletal: Normal range of motion. She exhibits no edema and no tenderness.  Lymphadenopathy:     She has no cervical adenopathy.  Neurological: She is alert and oriented to person, place, and time. No cranial nerve deficit. She exhibits normal muscle tone. Coordination normal.  Skin: Skin is warm and dry. No rash noted. She is not diaphoretic. No erythema. No pallor.  Psychiatric: She has a normal mood and affect. Her behavior is normal. Judgment and thought content normal.          Assessment & Plan:

## 2011-05-01 NOTE — Assessment & Plan Note (Signed)
Secondary to arthritis. Will continue prn hydrocodone. If pain worsening, pt will call and we will set up xray and orthopedic evaluation.

## 2011-05-01 NOTE — Assessment & Plan Note (Signed)
Symptoms recently improving. Has not sought counseling. Will decrease Zoloft to 25mg  daily and then stop after 1 month. Continue Wellbutrin. Follow up 3 months.

## 2011-05-01 NOTE — Assessment & Plan Note (Signed)
Recent unexpected improvement in renal function and has stopped peritoneal dialysis. Will get recent notes/records from nephrologist.

## 2011-05-05 ENCOUNTER — Encounter: Payer: Self-pay | Admitting: Internal Medicine

## 2011-05-07 DIAGNOSIS — I4891 Unspecified atrial fibrillation: Secondary | ICD-10-CM | POA: Diagnosis not present

## 2011-05-08 DIAGNOSIS — N2581 Secondary hyperparathyroidism of renal origin: Secondary | ICD-10-CM | POA: Diagnosis not present

## 2011-05-08 DIAGNOSIS — D631 Anemia in chronic kidney disease: Secondary | ICD-10-CM | POA: Diagnosis not present

## 2011-05-08 DIAGNOSIS — I10 Essential (primary) hypertension: Secondary | ICD-10-CM | POA: Diagnosis not present

## 2011-05-08 DIAGNOSIS — N039 Chronic nephritic syndrome with unspecified morphologic changes: Secondary | ICD-10-CM | POA: Diagnosis not present

## 2011-05-08 DIAGNOSIS — N184 Chronic kidney disease, stage 4 (severe): Secondary | ICD-10-CM | POA: Diagnosis not present

## 2011-05-11 DIAGNOSIS — N184 Chronic kidney disease, stage 4 (severe): Secondary | ICD-10-CM | POA: Diagnosis not present

## 2011-05-20 DIAGNOSIS — N184 Chronic kidney disease, stage 4 (severe): Secondary | ICD-10-CM | POA: Diagnosis not present

## 2011-05-28 ENCOUNTER — Telehealth: Payer: Self-pay | Admitting: Internal Medicine

## 2011-05-28 DIAGNOSIS — M25519 Pain in unspecified shoulder: Secondary | ICD-10-CM

## 2011-05-28 NOTE — Telephone Encounter (Signed)
There is a note that patient would call if pain worsened and a referral would be made.  Please advise.

## 2011-05-28 NOTE — Telephone Encounter (Signed)
Referral placed.

## 2011-05-28 NOTE — Telephone Encounter (Signed)
Patients right had has gotten worse should she make an appointment or does she need to be referred out.

## 2011-06-03 DIAGNOSIS — N289 Disorder of kidney and ureter, unspecified: Secondary | ICD-10-CM | POA: Diagnosis not present

## 2011-06-03 DIAGNOSIS — I2510 Atherosclerotic heart disease of native coronary artery without angina pectoris: Secondary | ICD-10-CM | POA: Diagnosis not present

## 2011-06-03 DIAGNOSIS — I10 Essential (primary) hypertension: Secondary | ICD-10-CM | POA: Diagnosis not present

## 2011-06-03 DIAGNOSIS — Z950 Presence of cardiac pacemaker: Secondary | ICD-10-CM | POA: Diagnosis not present

## 2011-06-09 DIAGNOSIS — M75100 Unspecified rotator cuff tear or rupture of unspecified shoulder, not specified as traumatic: Secondary | ICD-10-CM | POA: Diagnosis not present

## 2011-06-16 DIAGNOSIS — M75100 Unspecified rotator cuff tear or rupture of unspecified shoulder, not specified as traumatic: Secondary | ICD-10-CM | POA: Diagnosis not present

## 2011-06-19 DIAGNOSIS — D631 Anemia in chronic kidney disease: Secondary | ICD-10-CM | POA: Diagnosis not present

## 2011-06-19 DIAGNOSIS — M75100 Unspecified rotator cuff tear or rupture of unspecified shoulder, not specified as traumatic: Secondary | ICD-10-CM | POA: Diagnosis not present

## 2011-06-19 DIAGNOSIS — N186 End stage renal disease: Secondary | ICD-10-CM | POA: Diagnosis not present

## 2011-06-19 DIAGNOSIS — N039 Chronic nephritic syndrome with unspecified morphologic changes: Secondary | ICD-10-CM | POA: Diagnosis not present

## 2011-06-19 DIAGNOSIS — N185 Chronic kidney disease, stage 5: Secondary | ICD-10-CM | POA: Diagnosis not present

## 2011-06-19 DIAGNOSIS — I10 Essential (primary) hypertension: Secondary | ICD-10-CM | POA: Diagnosis not present

## 2011-06-19 DIAGNOSIS — N2581 Secondary hyperparathyroidism of renal origin: Secondary | ICD-10-CM | POA: Diagnosis not present

## 2011-06-20 DIAGNOSIS — N186 End stage renal disease: Secondary | ICD-10-CM | POA: Diagnosis not present

## 2011-06-21 DIAGNOSIS — N186 End stage renal disease: Secondary | ICD-10-CM | POA: Diagnosis not present

## 2011-06-22 DIAGNOSIS — N186 End stage renal disease: Secondary | ICD-10-CM | POA: Diagnosis not present

## 2011-06-23 DIAGNOSIS — N186 End stage renal disease: Secondary | ICD-10-CM | POA: Diagnosis not present

## 2011-06-24 DIAGNOSIS — N186 End stage renal disease: Secondary | ICD-10-CM | POA: Diagnosis not present

## 2011-06-25 DIAGNOSIS — N186 End stage renal disease: Secondary | ICD-10-CM | POA: Diagnosis not present

## 2011-06-26 DIAGNOSIS — N186 End stage renal disease: Secondary | ICD-10-CM | POA: Diagnosis not present

## 2011-06-27 DIAGNOSIS — D509 Iron deficiency anemia, unspecified: Secondary | ICD-10-CM | POA: Diagnosis not present

## 2011-06-27 DIAGNOSIS — Z992 Dependence on renal dialysis: Secondary | ICD-10-CM | POA: Diagnosis not present

## 2011-06-27 DIAGNOSIS — N186 End stage renal disease: Secondary | ICD-10-CM | POA: Diagnosis not present

## 2011-06-27 DIAGNOSIS — N2581 Secondary hyperparathyroidism of renal origin: Secondary | ICD-10-CM | POA: Diagnosis not present

## 2011-06-28 DIAGNOSIS — N186 End stage renal disease: Secondary | ICD-10-CM | POA: Diagnosis not present

## 2011-06-28 DIAGNOSIS — Z992 Dependence on renal dialysis: Secondary | ICD-10-CM | POA: Diagnosis not present

## 2011-06-28 DIAGNOSIS — D509 Iron deficiency anemia, unspecified: Secondary | ICD-10-CM | POA: Diagnosis not present

## 2011-06-28 DIAGNOSIS — N2581 Secondary hyperparathyroidism of renal origin: Secondary | ICD-10-CM | POA: Diagnosis not present

## 2011-06-29 DIAGNOSIS — Z992 Dependence on renal dialysis: Secondary | ICD-10-CM | POA: Diagnosis not present

## 2011-06-29 DIAGNOSIS — D509 Iron deficiency anemia, unspecified: Secondary | ICD-10-CM | POA: Diagnosis not present

## 2011-06-29 DIAGNOSIS — N2581 Secondary hyperparathyroidism of renal origin: Secondary | ICD-10-CM | POA: Diagnosis not present

## 2011-06-29 DIAGNOSIS — N186 End stage renal disease: Secondary | ICD-10-CM | POA: Diagnosis not present

## 2011-06-30 DIAGNOSIS — N186 End stage renal disease: Secondary | ICD-10-CM | POA: Diagnosis not present

## 2011-06-30 DIAGNOSIS — D509 Iron deficiency anemia, unspecified: Secondary | ICD-10-CM | POA: Diagnosis not present

## 2011-06-30 DIAGNOSIS — N2581 Secondary hyperparathyroidism of renal origin: Secondary | ICD-10-CM | POA: Diagnosis not present

## 2011-06-30 DIAGNOSIS — Z992 Dependence on renal dialysis: Secondary | ICD-10-CM | POA: Diagnosis not present

## 2011-07-01 DIAGNOSIS — Z992 Dependence on renal dialysis: Secondary | ICD-10-CM | POA: Diagnosis not present

## 2011-07-01 DIAGNOSIS — N2581 Secondary hyperparathyroidism of renal origin: Secondary | ICD-10-CM | POA: Diagnosis not present

## 2011-07-01 DIAGNOSIS — N186 End stage renal disease: Secondary | ICD-10-CM | POA: Diagnosis not present

## 2011-07-01 DIAGNOSIS — D509 Iron deficiency anemia, unspecified: Secondary | ICD-10-CM | POA: Diagnosis not present

## 2011-07-02 DIAGNOSIS — Z992 Dependence on renal dialysis: Secondary | ICD-10-CM | POA: Diagnosis not present

## 2011-07-02 DIAGNOSIS — D509 Iron deficiency anemia, unspecified: Secondary | ICD-10-CM | POA: Diagnosis not present

## 2011-07-02 DIAGNOSIS — N2581 Secondary hyperparathyroidism of renal origin: Secondary | ICD-10-CM | POA: Diagnosis not present

## 2011-07-02 DIAGNOSIS — N186 End stage renal disease: Secondary | ICD-10-CM | POA: Diagnosis not present

## 2011-07-03 DIAGNOSIS — N2581 Secondary hyperparathyroidism of renal origin: Secondary | ICD-10-CM | POA: Diagnosis not present

## 2011-07-03 DIAGNOSIS — D509 Iron deficiency anemia, unspecified: Secondary | ICD-10-CM | POA: Diagnosis not present

## 2011-07-03 DIAGNOSIS — Z992 Dependence on renal dialysis: Secondary | ICD-10-CM | POA: Diagnosis not present

## 2011-07-03 DIAGNOSIS — N186 End stage renal disease: Secondary | ICD-10-CM | POA: Diagnosis not present

## 2011-07-04 DIAGNOSIS — Z992 Dependence on renal dialysis: Secondary | ICD-10-CM | POA: Diagnosis not present

## 2011-07-04 DIAGNOSIS — N2581 Secondary hyperparathyroidism of renal origin: Secondary | ICD-10-CM | POA: Diagnosis not present

## 2011-07-04 DIAGNOSIS — N186 End stage renal disease: Secondary | ICD-10-CM | POA: Diagnosis not present

## 2011-07-04 DIAGNOSIS — D509 Iron deficiency anemia, unspecified: Secondary | ICD-10-CM | POA: Diagnosis not present

## 2011-07-05 DIAGNOSIS — Z992 Dependence on renal dialysis: Secondary | ICD-10-CM | POA: Diagnosis not present

## 2011-07-05 DIAGNOSIS — D509 Iron deficiency anemia, unspecified: Secondary | ICD-10-CM | POA: Diagnosis not present

## 2011-07-05 DIAGNOSIS — N2581 Secondary hyperparathyroidism of renal origin: Secondary | ICD-10-CM | POA: Diagnosis not present

## 2011-07-05 DIAGNOSIS — N186 End stage renal disease: Secondary | ICD-10-CM | POA: Diagnosis not present

## 2011-07-06 DIAGNOSIS — N186 End stage renal disease: Secondary | ICD-10-CM | POA: Diagnosis not present

## 2011-07-06 DIAGNOSIS — D509 Iron deficiency anemia, unspecified: Secondary | ICD-10-CM | POA: Diagnosis not present

## 2011-07-06 DIAGNOSIS — Z992 Dependence on renal dialysis: Secondary | ICD-10-CM | POA: Diagnosis not present

## 2011-07-06 DIAGNOSIS — N2581 Secondary hyperparathyroidism of renal origin: Secondary | ICD-10-CM | POA: Diagnosis not present

## 2011-07-07 DIAGNOSIS — Z992 Dependence on renal dialysis: Secondary | ICD-10-CM | POA: Diagnosis not present

## 2011-07-07 DIAGNOSIS — N2581 Secondary hyperparathyroidism of renal origin: Secondary | ICD-10-CM | POA: Diagnosis not present

## 2011-07-07 DIAGNOSIS — N186 End stage renal disease: Secondary | ICD-10-CM | POA: Diagnosis not present

## 2011-07-07 DIAGNOSIS — D509 Iron deficiency anemia, unspecified: Secondary | ICD-10-CM | POA: Diagnosis not present

## 2011-07-08 DIAGNOSIS — D509 Iron deficiency anemia, unspecified: Secondary | ICD-10-CM | POA: Diagnosis not present

## 2011-07-08 DIAGNOSIS — N186 End stage renal disease: Secondary | ICD-10-CM | POA: Diagnosis not present

## 2011-07-08 DIAGNOSIS — N2581 Secondary hyperparathyroidism of renal origin: Secondary | ICD-10-CM | POA: Diagnosis not present

## 2011-07-08 DIAGNOSIS — Z992 Dependence on renal dialysis: Secondary | ICD-10-CM | POA: Diagnosis not present

## 2011-07-09 DIAGNOSIS — Z992 Dependence on renal dialysis: Secondary | ICD-10-CM | POA: Diagnosis not present

## 2011-07-09 DIAGNOSIS — D509 Iron deficiency anemia, unspecified: Secondary | ICD-10-CM | POA: Diagnosis not present

## 2011-07-09 DIAGNOSIS — N2581 Secondary hyperparathyroidism of renal origin: Secondary | ICD-10-CM | POA: Diagnosis not present

## 2011-07-09 DIAGNOSIS — N186 End stage renal disease: Secondary | ICD-10-CM | POA: Diagnosis not present

## 2011-07-10 DIAGNOSIS — N186 End stage renal disease: Secondary | ICD-10-CM | POA: Diagnosis not present

## 2011-07-10 DIAGNOSIS — N2581 Secondary hyperparathyroidism of renal origin: Secondary | ICD-10-CM | POA: Diagnosis not present

## 2011-07-10 DIAGNOSIS — D509 Iron deficiency anemia, unspecified: Secondary | ICD-10-CM | POA: Diagnosis not present

## 2011-07-10 DIAGNOSIS — Z992 Dependence on renal dialysis: Secondary | ICD-10-CM | POA: Diagnosis not present

## 2011-07-11 DIAGNOSIS — N186 End stage renal disease: Secondary | ICD-10-CM | POA: Diagnosis not present

## 2011-07-11 DIAGNOSIS — N2581 Secondary hyperparathyroidism of renal origin: Secondary | ICD-10-CM | POA: Diagnosis not present

## 2011-07-11 DIAGNOSIS — D509 Iron deficiency anemia, unspecified: Secondary | ICD-10-CM | POA: Diagnosis not present

## 2011-07-11 DIAGNOSIS — Z992 Dependence on renal dialysis: Secondary | ICD-10-CM | POA: Diagnosis not present

## 2011-07-12 DIAGNOSIS — D509 Iron deficiency anemia, unspecified: Secondary | ICD-10-CM | POA: Diagnosis not present

## 2011-07-12 DIAGNOSIS — N186 End stage renal disease: Secondary | ICD-10-CM | POA: Diagnosis not present

## 2011-07-12 DIAGNOSIS — Z992 Dependence on renal dialysis: Secondary | ICD-10-CM | POA: Diagnosis not present

## 2011-07-12 DIAGNOSIS — N2581 Secondary hyperparathyroidism of renal origin: Secondary | ICD-10-CM | POA: Diagnosis not present

## 2011-07-13 DIAGNOSIS — N186 End stage renal disease: Secondary | ICD-10-CM | POA: Diagnosis not present

## 2011-07-13 DIAGNOSIS — N2581 Secondary hyperparathyroidism of renal origin: Secondary | ICD-10-CM | POA: Diagnosis not present

## 2011-07-13 DIAGNOSIS — Z992 Dependence on renal dialysis: Secondary | ICD-10-CM | POA: Diagnosis not present

## 2011-07-13 DIAGNOSIS — D509 Iron deficiency anemia, unspecified: Secondary | ICD-10-CM | POA: Diagnosis not present

## 2011-07-14 DIAGNOSIS — N186 End stage renal disease: Secondary | ICD-10-CM | POA: Diagnosis not present

## 2011-07-14 DIAGNOSIS — D509 Iron deficiency anemia, unspecified: Secondary | ICD-10-CM | POA: Diagnosis not present

## 2011-07-14 DIAGNOSIS — Z992 Dependence on renal dialysis: Secondary | ICD-10-CM | POA: Diagnosis not present

## 2011-07-14 DIAGNOSIS — N2581 Secondary hyperparathyroidism of renal origin: Secondary | ICD-10-CM | POA: Diagnosis not present

## 2011-07-15 DIAGNOSIS — N186 End stage renal disease: Secondary | ICD-10-CM | POA: Diagnosis not present

## 2011-07-15 DIAGNOSIS — Z992 Dependence on renal dialysis: Secondary | ICD-10-CM | POA: Diagnosis not present

## 2011-07-15 DIAGNOSIS — D509 Iron deficiency anemia, unspecified: Secondary | ICD-10-CM | POA: Diagnosis not present

## 2011-07-15 DIAGNOSIS — N2581 Secondary hyperparathyroidism of renal origin: Secondary | ICD-10-CM | POA: Diagnosis not present

## 2011-07-16 DIAGNOSIS — D509 Iron deficiency anemia, unspecified: Secondary | ICD-10-CM | POA: Diagnosis not present

## 2011-07-16 DIAGNOSIS — N2581 Secondary hyperparathyroidism of renal origin: Secondary | ICD-10-CM | POA: Diagnosis not present

## 2011-07-16 DIAGNOSIS — N186 End stage renal disease: Secondary | ICD-10-CM | POA: Diagnosis not present

## 2011-07-16 DIAGNOSIS — Z992 Dependence on renal dialysis: Secondary | ICD-10-CM | POA: Diagnosis not present

## 2011-07-17 DIAGNOSIS — N186 End stage renal disease: Secondary | ICD-10-CM | POA: Diagnosis not present

## 2011-07-17 DIAGNOSIS — N2581 Secondary hyperparathyroidism of renal origin: Secondary | ICD-10-CM | POA: Diagnosis not present

## 2011-07-17 DIAGNOSIS — D509 Iron deficiency anemia, unspecified: Secondary | ICD-10-CM | POA: Diagnosis not present

## 2011-07-17 DIAGNOSIS — Z992 Dependence on renal dialysis: Secondary | ICD-10-CM | POA: Diagnosis not present

## 2011-07-18 DIAGNOSIS — N186 End stage renal disease: Secondary | ICD-10-CM | POA: Diagnosis not present

## 2011-07-18 DIAGNOSIS — D509 Iron deficiency anemia, unspecified: Secondary | ICD-10-CM | POA: Diagnosis not present

## 2011-07-18 DIAGNOSIS — N2581 Secondary hyperparathyroidism of renal origin: Secondary | ICD-10-CM | POA: Diagnosis not present

## 2011-07-18 DIAGNOSIS — Z992 Dependence on renal dialysis: Secondary | ICD-10-CM | POA: Diagnosis not present

## 2011-07-19 DIAGNOSIS — D509 Iron deficiency anemia, unspecified: Secondary | ICD-10-CM | POA: Diagnosis not present

## 2011-07-19 DIAGNOSIS — Z992 Dependence on renal dialysis: Secondary | ICD-10-CM | POA: Diagnosis not present

## 2011-07-19 DIAGNOSIS — N186 End stage renal disease: Secondary | ICD-10-CM | POA: Diagnosis not present

## 2011-07-19 DIAGNOSIS — N2581 Secondary hyperparathyroidism of renal origin: Secondary | ICD-10-CM | POA: Diagnosis not present

## 2011-07-20 DIAGNOSIS — Z992 Dependence on renal dialysis: Secondary | ICD-10-CM | POA: Diagnosis not present

## 2011-07-20 DIAGNOSIS — N2581 Secondary hyperparathyroidism of renal origin: Secondary | ICD-10-CM | POA: Diagnosis not present

## 2011-07-20 DIAGNOSIS — D509 Iron deficiency anemia, unspecified: Secondary | ICD-10-CM | POA: Diagnosis not present

## 2011-07-20 DIAGNOSIS — N186 End stage renal disease: Secondary | ICD-10-CM | POA: Diagnosis not present

## 2011-07-21 DIAGNOSIS — D509 Iron deficiency anemia, unspecified: Secondary | ICD-10-CM | POA: Diagnosis not present

## 2011-07-21 DIAGNOSIS — N186 End stage renal disease: Secondary | ICD-10-CM | POA: Diagnosis not present

## 2011-07-21 DIAGNOSIS — Z992 Dependence on renal dialysis: Secondary | ICD-10-CM | POA: Diagnosis not present

## 2011-07-21 DIAGNOSIS — N2581 Secondary hyperparathyroidism of renal origin: Secondary | ICD-10-CM | POA: Diagnosis not present

## 2011-07-22 DIAGNOSIS — N2581 Secondary hyperparathyroidism of renal origin: Secondary | ICD-10-CM | POA: Diagnosis not present

## 2011-07-22 DIAGNOSIS — N186 End stage renal disease: Secondary | ICD-10-CM | POA: Diagnosis not present

## 2011-07-22 DIAGNOSIS — D509 Iron deficiency anemia, unspecified: Secondary | ICD-10-CM | POA: Diagnosis not present

## 2011-07-22 DIAGNOSIS — Z992 Dependence on renal dialysis: Secondary | ICD-10-CM | POA: Diagnosis not present

## 2011-07-23 DIAGNOSIS — D509 Iron deficiency anemia, unspecified: Secondary | ICD-10-CM | POA: Diagnosis not present

## 2011-07-23 DIAGNOSIS — N2581 Secondary hyperparathyroidism of renal origin: Secondary | ICD-10-CM | POA: Diagnosis not present

## 2011-07-23 DIAGNOSIS — N186 End stage renal disease: Secondary | ICD-10-CM | POA: Diagnosis not present

## 2011-07-23 DIAGNOSIS — Z992 Dependence on renal dialysis: Secondary | ICD-10-CM | POA: Diagnosis not present

## 2011-07-24 DIAGNOSIS — N2581 Secondary hyperparathyroidism of renal origin: Secondary | ICD-10-CM | POA: Diagnosis not present

## 2011-07-24 DIAGNOSIS — D509 Iron deficiency anemia, unspecified: Secondary | ICD-10-CM | POA: Diagnosis not present

## 2011-07-24 DIAGNOSIS — Z992 Dependence on renal dialysis: Secondary | ICD-10-CM | POA: Diagnosis not present

## 2011-07-24 DIAGNOSIS — N186 End stage renal disease: Secondary | ICD-10-CM | POA: Diagnosis not present

## 2011-07-25 DIAGNOSIS — N186 End stage renal disease: Secondary | ICD-10-CM | POA: Diagnosis not present

## 2011-07-25 DIAGNOSIS — N2581 Secondary hyperparathyroidism of renal origin: Secondary | ICD-10-CM | POA: Diagnosis not present

## 2011-07-25 DIAGNOSIS — D509 Iron deficiency anemia, unspecified: Secondary | ICD-10-CM | POA: Diagnosis not present

## 2011-07-25 DIAGNOSIS — Z992 Dependence on renal dialysis: Secondary | ICD-10-CM | POA: Diagnosis not present

## 2011-07-26 DIAGNOSIS — N186 End stage renal disease: Secondary | ICD-10-CM | POA: Diagnosis not present

## 2011-07-26 DIAGNOSIS — N2581 Secondary hyperparathyroidism of renal origin: Secondary | ICD-10-CM | POA: Diagnosis not present

## 2011-07-26 DIAGNOSIS — D509 Iron deficiency anemia, unspecified: Secondary | ICD-10-CM | POA: Diagnosis not present

## 2011-07-26 DIAGNOSIS — Z992 Dependence on renal dialysis: Secondary | ICD-10-CM | POA: Diagnosis not present

## 2011-07-27 DIAGNOSIS — N2581 Secondary hyperparathyroidism of renal origin: Secondary | ICD-10-CM | POA: Diagnosis not present

## 2011-07-27 DIAGNOSIS — N186 End stage renal disease: Secondary | ICD-10-CM | POA: Diagnosis not present

## 2011-07-27 DIAGNOSIS — D509 Iron deficiency anemia, unspecified: Secondary | ICD-10-CM | POA: Diagnosis not present

## 2011-07-31 ENCOUNTER — Encounter: Payer: Self-pay | Admitting: Internal Medicine

## 2011-07-31 ENCOUNTER — Ambulatory Visit (INDEPENDENT_AMBULATORY_CARE_PROVIDER_SITE_OTHER): Payer: Medicare Other | Admitting: Internal Medicine

## 2011-07-31 VITALS — BP 130/80 | HR 80 | Temp 98.3°F | Ht 64.0 in | Wt 136.2 lb

## 2011-07-31 DIAGNOSIS — N185 Chronic kidney disease, stage 5: Secondary | ICD-10-CM | POA: Diagnosis not present

## 2011-07-31 DIAGNOSIS — F32A Depression, unspecified: Secondary | ICD-10-CM

## 2011-07-31 DIAGNOSIS — F329 Major depressive disorder, single episode, unspecified: Secondary | ICD-10-CM

## 2011-07-31 DIAGNOSIS — L989 Disorder of the skin and subcutaneous tissue, unspecified: Secondary | ICD-10-CM | POA: Diagnosis not present

## 2011-07-31 NOTE — Assessment & Plan Note (Signed)
Lesion mid upper scalp which has appearance concerning for basal cell carcinoma. Will set up dermatology evaluation for biopsy.

## 2011-07-31 NOTE — Assessment & Plan Note (Signed)
Symptoms fairly well controlled on Wellbutrin. Again, encouraged her to consider counseling to help with grief issues. Follow up 3 months.

## 2011-07-31 NOTE — Assessment & Plan Note (Signed)
Back on PD. Tolerating well. Reviewed labs from nephrologist today. Follow up 3 months.

## 2011-07-31 NOTE — Progress Notes (Signed)
Subjective:    Patient ID: Sharon [name], female    DOB: [birth year], 78 y.o.   MRN: [id number]  HPI 78YO female with h/o CKD stage V on PD, HTN, depression presents for follow up. In regards to CKD, she notes she is back on PD 7 days per week. Tolerating well and renal function has improved some, so may try to reduce days per week.  In regards to depression, still dealing with grief issues from loss of husband, but generally feeling better off Sertraline. Taking Wellbutrin alone. Has not yet sought counseling.  She is concerned today about scalp lesion which has been present unknown length of time. Lesion is raised and not painful. Located mid upper scalp.  Outpatient Encounter Prescriptions as of 07/31/2011  Medication Sig Dispense Refill  . buPROPion (WELLBUTRIN SR) 150 MG 12 hr tablet Take 1 tablet (150 mg total) by mouth 2 (two) times daily.  60 tablet  3  . ergocalciferol (VITAMIN D2) 50000 UNITS capsule Take 50,000 Units by mouth once a week.      . estrogens, conjugated, (PREMARIN) 0.3 MG tablet Take 1 pill daily  30 tablet  3  . folic acid-vitamin b complex-vitamin c-selenium-zinc (DIALYVITE) 3 MG TABS Take 1 tablet by mouth daily.      . metoprolol succinate (TOPROL-XL) 25 MG 24 hr tablet Take 1 tablet (25 mg total) by mouth daily.  30 tablet  6  . triamterene-hydrochlorothiazide (MAXZIDE) 75-50 MG per tablet Take 1 tablet by mouth 2 (two) times daily.  60 tablet  6  . DISCONTD: sertraline (ZOLOFT) 25 MG tablet Take 1 tablet (25 mg total) by mouth daily.  30 tablet  3    Review of Systems  Constitutional: Negative for fever, chills, appetite change, fatigue and unexpected weight change.  HENT: Negative for ear pain, congestion, sore throat, trouble swallowing, neck pain, voice change and sinus pressure.   Eyes: Negative for visual disturbance.  Respiratory: Negative for cough, shortness of breath, wheezing and stridor.   Cardiovascular: Negative for chest pain, palpitations and leg  swelling.  Gastrointestinal: Negative for nausea, vomiting, abdominal pain, diarrhea, constipation, blood in stool, abdominal distention and anal bleeding.  Genitourinary: Positive for decreased urine volume. Negative for dysuria and flank pain.  Musculoskeletal: Negative for myalgias, arthralgias and gait problem.  Skin: Negative for color change and rash.  Neurological: Negative for dizziness and headaches.  Hematological: Negative for adenopathy. Does not bruise/bleed easily.  Psychiatric/Behavioral: Positive for dysphoric mood. Negative for suicidal ideas and disturbed wake/sleep cycle. The patient is nervous/anxious.    BP 130/80  Pulse 80  Temp 98.3 F (36.8 C) (Oral)  Ht 5\' 4"  (1.626 m)  Wt 136 lb 4 oz (61.803 kg)  BMI 23.39 kg/m2  SpO2 98%     Objective:   Physical Exam  Constitutional: She is oriented to person, place, and time. She appears well-developed and well-nourished. No distress.  HENT:  Head: Normocephalic and atraumatic.    Right Ear: External ear normal.  Left Ear: External ear normal.  Nose: Nose normal.  Mouth/Throat: Oropharynx is clear and moist. No oropharyngeal exudate.  Eyes: Conjunctivae are normal. Pupils are equal, round, and reactive to light. Right eye exhibits no discharge. Left eye exhibits no discharge. No scleral icterus.  Neck: Normal range of motion. Neck supple. No tracheal deviation present. No thyromegaly present.  Cardiovascular: Normal rate, regular rhythm, normal heart sounds and intact distal pulses.  Exam reveals no gallop and no friction rub.   No murmur  heard. Pulmonary/Chest: Effort normal and breath sounds normal. No respiratory distress. She has no wheezes. She has no rales. She exhibits no tenderness.  Musculoskeletal: Normal range of motion. She exhibits no edema and no tenderness.  Lymphadenopathy:    She has no cervical adenopathy.  Neurological: She is alert and oriented to person, place, and time. No cranial nerve deficit.  She exhibits normal muscle tone. Coordination normal.  Skin: Skin is warm and dry. No rash noted. She is not diaphoretic. No erythema. No pallor.  Psychiatric: She has a normal mood and affect. Her behavior is normal. Judgment and thought content normal.          Assessment & Plan:

## 2011-08-17 ENCOUNTER — Other Ambulatory Visit: Payer: Self-pay | Admitting: *Deleted

## 2011-08-17 DIAGNOSIS — N959 Unspecified menopausal and perimenopausal disorder: Secondary | ICD-10-CM

## 2011-08-17 MED ORDER — ESTROGENS CONJUGATED 0.3 MG PO TABS: ORAL_TABLET | ORAL | Status: DC

## 2011-08-19 DIAGNOSIS — D485 Neoplasm of uncertain behavior of skin: Secondary | ICD-10-CM | POA: Diagnosis not present

## 2011-08-19 DIAGNOSIS — C4441 Basal cell carcinoma of skin of scalp and neck: Secondary | ICD-10-CM | POA: Diagnosis not present

## 2011-08-27 DIAGNOSIS — D509 Iron deficiency anemia, unspecified: Secondary | ICD-10-CM | POA: Diagnosis not present

## 2011-08-27 DIAGNOSIS — N186 End stage renal disease: Secondary | ICD-10-CM | POA: Diagnosis not present

## 2011-08-28 DIAGNOSIS — N186 End stage renal disease: Secondary | ICD-10-CM | POA: Diagnosis not present

## 2011-08-28 DIAGNOSIS — D509 Iron deficiency anemia, unspecified: Secondary | ICD-10-CM | POA: Diagnosis not present

## 2011-08-29 DIAGNOSIS — N186 End stage renal disease: Secondary | ICD-10-CM | POA: Diagnosis not present

## 2011-08-29 DIAGNOSIS — D509 Iron deficiency anemia, unspecified: Secondary | ICD-10-CM | POA: Diagnosis not present

## 2011-08-30 DIAGNOSIS — N186 End stage renal disease: Secondary | ICD-10-CM | POA: Diagnosis not present

## 2011-08-30 DIAGNOSIS — D509 Iron deficiency anemia, unspecified: Secondary | ICD-10-CM | POA: Diagnosis not present

## 2011-08-31 DIAGNOSIS — D509 Iron deficiency anemia, unspecified: Secondary | ICD-10-CM | POA: Diagnosis not present

## 2011-08-31 DIAGNOSIS — N186 End stage renal disease: Secondary | ICD-10-CM | POA: Diagnosis not present

## 2011-09-01 DIAGNOSIS — N186 End stage renal disease: Secondary | ICD-10-CM | POA: Diagnosis not present

## 2011-09-01 DIAGNOSIS — D509 Iron deficiency anemia, unspecified: Secondary | ICD-10-CM | POA: Diagnosis not present

## 2011-09-02 DIAGNOSIS — D509 Iron deficiency anemia, unspecified: Secondary | ICD-10-CM | POA: Diagnosis not present

## 2011-09-02 DIAGNOSIS — N186 End stage renal disease: Secondary | ICD-10-CM | POA: Diagnosis not present

## 2011-09-03 DIAGNOSIS — D509 Iron deficiency anemia, unspecified: Secondary | ICD-10-CM | POA: Diagnosis not present

## 2011-09-03 DIAGNOSIS — N186 End stage renal disease: Secondary | ICD-10-CM | POA: Diagnosis not present

## 2011-09-04 DIAGNOSIS — D509 Iron deficiency anemia, unspecified: Secondary | ICD-10-CM | POA: Diagnosis not present

## 2011-09-04 DIAGNOSIS — N186 End stage renal disease: Secondary | ICD-10-CM | POA: Diagnosis not present

## 2011-09-05 DIAGNOSIS — N186 End stage renal disease: Secondary | ICD-10-CM | POA: Diagnosis not present

## 2011-09-05 DIAGNOSIS — D509 Iron deficiency anemia, unspecified: Secondary | ICD-10-CM | POA: Diagnosis not present

## 2011-09-06 DIAGNOSIS — D509 Iron deficiency anemia, unspecified: Secondary | ICD-10-CM | POA: Diagnosis not present

## 2011-09-06 DIAGNOSIS — N186 End stage renal disease: Secondary | ICD-10-CM | POA: Diagnosis not present

## 2011-09-07 DIAGNOSIS — D509 Iron deficiency anemia, unspecified: Secondary | ICD-10-CM | POA: Diagnosis not present

## 2011-09-07 DIAGNOSIS — N186 End stage renal disease: Secondary | ICD-10-CM | POA: Diagnosis not present

## 2011-09-08 DIAGNOSIS — D509 Iron deficiency anemia, unspecified: Secondary | ICD-10-CM | POA: Diagnosis not present

## 2011-09-08 DIAGNOSIS — N186 End stage renal disease: Secondary | ICD-10-CM | POA: Diagnosis not present

## 2011-09-09 DIAGNOSIS — D509 Iron deficiency anemia, unspecified: Secondary | ICD-10-CM | POA: Diagnosis not present

## 2011-09-09 DIAGNOSIS — N186 End stage renal disease: Secondary | ICD-10-CM | POA: Diagnosis not present

## 2011-09-10 DIAGNOSIS — D509 Iron deficiency anemia, unspecified: Secondary | ICD-10-CM | POA: Diagnosis not present

## 2011-09-10 DIAGNOSIS — N186 End stage renal disease: Secondary | ICD-10-CM | POA: Diagnosis not present

## 2011-09-11 DIAGNOSIS — D509 Iron deficiency anemia, unspecified: Secondary | ICD-10-CM | POA: Diagnosis not present

## 2011-09-11 DIAGNOSIS — N186 End stage renal disease: Secondary | ICD-10-CM | POA: Diagnosis not present

## 2011-09-12 DIAGNOSIS — N186 End stage renal disease: Secondary | ICD-10-CM | POA: Diagnosis not present

## 2011-09-12 DIAGNOSIS — D509 Iron deficiency anemia, unspecified: Secondary | ICD-10-CM | POA: Diagnosis not present

## 2011-09-13 DIAGNOSIS — D509 Iron deficiency anemia, unspecified: Secondary | ICD-10-CM | POA: Diagnosis not present

## 2011-09-13 DIAGNOSIS — N186 End stage renal disease: Secondary | ICD-10-CM | POA: Diagnosis not present

## 2011-09-14 DIAGNOSIS — D509 Iron deficiency anemia, unspecified: Secondary | ICD-10-CM | POA: Diagnosis not present

## 2011-09-14 DIAGNOSIS — N186 End stage renal disease: Secondary | ICD-10-CM | POA: Diagnosis not present

## 2011-09-15 DIAGNOSIS — N186 End stage renal disease: Secondary | ICD-10-CM | POA: Diagnosis not present

## 2011-09-15 DIAGNOSIS — D509 Iron deficiency anemia, unspecified: Secondary | ICD-10-CM | POA: Diagnosis not present

## 2011-09-16 DIAGNOSIS — N186 End stage renal disease: Secondary | ICD-10-CM | POA: Diagnosis not present

## 2011-09-16 DIAGNOSIS — D509 Iron deficiency anemia, unspecified: Secondary | ICD-10-CM | POA: Diagnosis not present

## 2011-09-17 DIAGNOSIS — D509 Iron deficiency anemia, unspecified: Secondary | ICD-10-CM | POA: Diagnosis not present

## 2011-09-17 DIAGNOSIS — N186 End stage renal disease: Secondary | ICD-10-CM | POA: Diagnosis not present

## 2011-09-18 DIAGNOSIS — D509 Iron deficiency anemia, unspecified: Secondary | ICD-10-CM | POA: Diagnosis not present

## 2011-09-18 DIAGNOSIS — N186 End stage renal disease: Secondary | ICD-10-CM | POA: Diagnosis not present

## 2011-09-19 DIAGNOSIS — N186 End stage renal disease: Secondary | ICD-10-CM | POA: Diagnosis not present

## 2011-09-19 DIAGNOSIS — D509 Iron deficiency anemia, unspecified: Secondary | ICD-10-CM | POA: Diagnosis not present

## 2011-09-20 DIAGNOSIS — D509 Iron deficiency anemia, unspecified: Secondary | ICD-10-CM | POA: Diagnosis not present

## 2011-09-20 DIAGNOSIS — N186 End stage renal disease: Secondary | ICD-10-CM | POA: Diagnosis not present

## 2011-09-21 DIAGNOSIS — C4441 Basal cell carcinoma of skin of scalp and neck: Secondary | ICD-10-CM | POA: Diagnosis not present

## 2011-09-21 DIAGNOSIS — D509 Iron deficiency anemia, unspecified: Secondary | ICD-10-CM | POA: Diagnosis not present

## 2011-09-21 DIAGNOSIS — N186 End stage renal disease: Secondary | ICD-10-CM | POA: Diagnosis not present

## 2011-09-21 DIAGNOSIS — L820 Inflamed seborrheic keratosis: Secondary | ICD-10-CM | POA: Diagnosis not present

## 2011-09-22 ENCOUNTER — Other Ambulatory Visit: Payer: Self-pay | Admitting: *Deleted

## 2011-09-22 DIAGNOSIS — D509 Iron deficiency anemia, unspecified: Secondary | ICD-10-CM | POA: Diagnosis not present

## 2011-09-22 DIAGNOSIS — N186 End stage renal disease: Secondary | ICD-10-CM | POA: Diagnosis not present

## 2011-09-22 DIAGNOSIS — F329 Major depressive disorder, single episode, unspecified: Secondary | ICD-10-CM

## 2011-09-22 MED ORDER — BUPROPION HCL ER (SR) 150 MG PO TB12: 150.0000 mg | ORAL_TABLET | Freq: Two times a day (BID) | ORAL | Status: DC

## 2011-09-23 DIAGNOSIS — D509 Iron deficiency anemia, unspecified: Secondary | ICD-10-CM | POA: Diagnosis not present

## 2011-09-23 DIAGNOSIS — N186 End stage renal disease: Secondary | ICD-10-CM | POA: Diagnosis not present

## 2011-09-24 DIAGNOSIS — D509 Iron deficiency anemia, unspecified: Secondary | ICD-10-CM | POA: Diagnosis not present

## 2011-09-24 DIAGNOSIS — N186 End stage renal disease: Secondary | ICD-10-CM | POA: Diagnosis not present

## 2011-09-25 DIAGNOSIS — N186 End stage renal disease: Secondary | ICD-10-CM | POA: Diagnosis not present

## 2011-09-25 DIAGNOSIS — D509 Iron deficiency anemia, unspecified: Secondary | ICD-10-CM | POA: Diagnosis not present

## 2011-09-26 DIAGNOSIS — N186 End stage renal disease: Secondary | ICD-10-CM | POA: Diagnosis not present

## 2011-09-26 DIAGNOSIS — D509 Iron deficiency anemia, unspecified: Secondary | ICD-10-CM | POA: Diagnosis not present

## 2011-09-27 DIAGNOSIS — N2581 Secondary hyperparathyroidism of renal origin: Secondary | ICD-10-CM | POA: Diagnosis not present

## 2011-09-27 DIAGNOSIS — N186 End stage renal disease: Secondary | ICD-10-CM | POA: Diagnosis not present

## 2011-09-27 DIAGNOSIS — D509 Iron deficiency anemia, unspecified: Secondary | ICD-10-CM | POA: Diagnosis not present

## 2011-09-27 DIAGNOSIS — Z23 Encounter for immunization: Secondary | ICD-10-CM | POA: Diagnosis not present

## 2011-09-28 DIAGNOSIS — Z23 Encounter for immunization: Secondary | ICD-10-CM | POA: Diagnosis not present

## 2011-09-28 DIAGNOSIS — N2581 Secondary hyperparathyroidism of renal origin: Secondary | ICD-10-CM | POA: Diagnosis not present

## 2011-09-28 DIAGNOSIS — D509 Iron deficiency anemia, unspecified: Secondary | ICD-10-CM | POA: Diagnosis not present

## 2011-09-28 DIAGNOSIS — N186 End stage renal disease: Secondary | ICD-10-CM | POA: Diagnosis not present

## 2011-09-29 DIAGNOSIS — D509 Iron deficiency anemia, unspecified: Secondary | ICD-10-CM | POA: Diagnosis not present

## 2011-09-29 DIAGNOSIS — Z23 Encounter for immunization: Secondary | ICD-10-CM | POA: Diagnosis not present

## 2011-09-29 DIAGNOSIS — N186 End stage renal disease: Secondary | ICD-10-CM | POA: Diagnosis not present

## 2011-09-29 DIAGNOSIS — N2581 Secondary hyperparathyroidism of renal origin: Secondary | ICD-10-CM | POA: Diagnosis not present

## 2011-09-30 DIAGNOSIS — N186 End stage renal disease: Secondary | ICD-10-CM | POA: Diagnosis not present

## 2011-09-30 DIAGNOSIS — Z23 Encounter for immunization: Secondary | ICD-10-CM | POA: Diagnosis not present

## 2011-09-30 DIAGNOSIS — D509 Iron deficiency anemia, unspecified: Secondary | ICD-10-CM | POA: Diagnosis not present

## 2011-09-30 DIAGNOSIS — N2581 Secondary hyperparathyroidism of renal origin: Secondary | ICD-10-CM | POA: Diagnosis not present

## 2011-10-01 DIAGNOSIS — N186 End stage renal disease: Secondary | ICD-10-CM | POA: Diagnosis not present

## 2011-10-01 DIAGNOSIS — D509 Iron deficiency anemia, unspecified: Secondary | ICD-10-CM | POA: Diagnosis not present

## 2011-10-01 DIAGNOSIS — Z23 Encounter for immunization: Secondary | ICD-10-CM | POA: Diagnosis not present

## 2011-10-01 DIAGNOSIS — N2581 Secondary hyperparathyroidism of renal origin: Secondary | ICD-10-CM | POA: Diagnosis not present

## 2011-10-02 DIAGNOSIS — Z23 Encounter for immunization: Secondary | ICD-10-CM | POA: Diagnosis not present

## 2011-10-02 DIAGNOSIS — N186 End stage renal disease: Secondary | ICD-10-CM | POA: Diagnosis not present

## 2011-10-02 DIAGNOSIS — D509 Iron deficiency anemia, unspecified: Secondary | ICD-10-CM | POA: Diagnosis not present

## 2011-10-02 DIAGNOSIS — N2581 Secondary hyperparathyroidism of renal origin: Secondary | ICD-10-CM | POA: Diagnosis not present

## 2011-10-03 DIAGNOSIS — Z23 Encounter for immunization: Secondary | ICD-10-CM | POA: Diagnosis not present

## 2011-10-03 DIAGNOSIS — N2581 Secondary hyperparathyroidism of renal origin: Secondary | ICD-10-CM | POA: Diagnosis not present

## 2011-10-03 DIAGNOSIS — D509 Iron deficiency anemia, unspecified: Secondary | ICD-10-CM | POA: Diagnosis not present

## 2011-10-03 DIAGNOSIS — N186 End stage renal disease: Secondary | ICD-10-CM | POA: Diagnosis not present

## 2011-10-04 DIAGNOSIS — N2581 Secondary hyperparathyroidism of renal origin: Secondary | ICD-10-CM | POA: Diagnosis not present

## 2011-10-04 DIAGNOSIS — D509 Iron deficiency anemia, unspecified: Secondary | ICD-10-CM | POA: Diagnosis not present

## 2011-10-04 DIAGNOSIS — Z23 Encounter for immunization: Secondary | ICD-10-CM | POA: Diagnosis not present

## 2011-10-04 DIAGNOSIS — N186 End stage renal disease: Secondary | ICD-10-CM | POA: Diagnosis not present

## 2011-10-05 DIAGNOSIS — D509 Iron deficiency anemia, unspecified: Secondary | ICD-10-CM | POA: Diagnosis not present

## 2011-10-05 DIAGNOSIS — N2581 Secondary hyperparathyroidism of renal origin: Secondary | ICD-10-CM | POA: Diagnosis not present

## 2011-10-05 DIAGNOSIS — N186 End stage renal disease: Secondary | ICD-10-CM | POA: Diagnosis not present

## 2011-10-05 DIAGNOSIS — Z23 Encounter for immunization: Secondary | ICD-10-CM | POA: Diagnosis not present

## 2011-10-06 DIAGNOSIS — N2581 Secondary hyperparathyroidism of renal origin: Secondary | ICD-10-CM | POA: Diagnosis not present

## 2011-10-06 DIAGNOSIS — Z23 Encounter for immunization: Secondary | ICD-10-CM | POA: Diagnosis not present

## 2011-10-06 DIAGNOSIS — D509 Iron deficiency anemia, unspecified: Secondary | ICD-10-CM | POA: Diagnosis not present

## 2011-10-06 DIAGNOSIS — N186 End stage renal disease: Secondary | ICD-10-CM | POA: Diagnosis not present

## 2011-10-07 DIAGNOSIS — N2581 Secondary hyperparathyroidism of renal origin: Secondary | ICD-10-CM | POA: Diagnosis not present

## 2011-10-07 DIAGNOSIS — Z23 Encounter for immunization: Secondary | ICD-10-CM | POA: Diagnosis not present

## 2011-10-07 DIAGNOSIS — D509 Iron deficiency anemia, unspecified: Secondary | ICD-10-CM | POA: Diagnosis not present

## 2011-10-07 DIAGNOSIS — N186 End stage renal disease: Secondary | ICD-10-CM | POA: Diagnosis not present

## 2011-10-08 DIAGNOSIS — N2581 Secondary hyperparathyroidism of renal origin: Secondary | ICD-10-CM | POA: Diagnosis not present

## 2011-10-08 DIAGNOSIS — D509 Iron deficiency anemia, unspecified: Secondary | ICD-10-CM | POA: Diagnosis not present

## 2011-10-08 DIAGNOSIS — Z23 Encounter for immunization: Secondary | ICD-10-CM | POA: Diagnosis not present

## 2011-10-08 DIAGNOSIS — N186 End stage renal disease: Secondary | ICD-10-CM | POA: Diagnosis not present

## 2011-10-09 DIAGNOSIS — N186 End stage renal disease: Secondary | ICD-10-CM | POA: Diagnosis not present

## 2011-10-09 DIAGNOSIS — N2581 Secondary hyperparathyroidism of renal origin: Secondary | ICD-10-CM | POA: Diagnosis not present

## 2011-10-09 DIAGNOSIS — D509 Iron deficiency anemia, unspecified: Secondary | ICD-10-CM | POA: Diagnosis not present

## 2011-10-09 DIAGNOSIS — Z23 Encounter for immunization: Secondary | ICD-10-CM | POA: Diagnosis not present

## 2011-10-10 DIAGNOSIS — D509 Iron deficiency anemia, unspecified: Secondary | ICD-10-CM | POA: Diagnosis not present

## 2011-10-10 DIAGNOSIS — Z23 Encounter for immunization: Secondary | ICD-10-CM | POA: Diagnosis not present

## 2011-10-10 DIAGNOSIS — N186 End stage renal disease: Secondary | ICD-10-CM | POA: Diagnosis not present

## 2011-10-10 DIAGNOSIS — N2581 Secondary hyperparathyroidism of renal origin: Secondary | ICD-10-CM | POA: Diagnosis not present

## 2011-10-11 DIAGNOSIS — D509 Iron deficiency anemia, unspecified: Secondary | ICD-10-CM | POA: Diagnosis not present

## 2011-10-11 DIAGNOSIS — N2581 Secondary hyperparathyroidism of renal origin: Secondary | ICD-10-CM | POA: Diagnosis not present

## 2011-10-11 DIAGNOSIS — N186 End stage renal disease: Secondary | ICD-10-CM | POA: Diagnosis not present

## 2011-10-11 DIAGNOSIS — Z23 Encounter for immunization: Secondary | ICD-10-CM | POA: Diagnosis not present

## 2011-10-12 DIAGNOSIS — N2581 Secondary hyperparathyroidism of renal origin: Secondary | ICD-10-CM | POA: Diagnosis not present

## 2011-10-12 DIAGNOSIS — D509 Iron deficiency anemia, unspecified: Secondary | ICD-10-CM | POA: Diagnosis not present

## 2011-10-12 DIAGNOSIS — N186 End stage renal disease: Secondary | ICD-10-CM | POA: Diagnosis not present

## 2011-10-12 DIAGNOSIS — Z23 Encounter for immunization: Secondary | ICD-10-CM | POA: Diagnosis not present

## 2011-10-13 DIAGNOSIS — N2581 Secondary hyperparathyroidism of renal origin: Secondary | ICD-10-CM | POA: Diagnosis not present

## 2011-10-13 DIAGNOSIS — D509 Iron deficiency anemia, unspecified: Secondary | ICD-10-CM | POA: Diagnosis not present

## 2011-10-13 DIAGNOSIS — Z23 Encounter for immunization: Secondary | ICD-10-CM | POA: Diagnosis not present

## 2011-10-13 DIAGNOSIS — N186 End stage renal disease: Secondary | ICD-10-CM | POA: Diagnosis not present

## 2011-10-14 DIAGNOSIS — N2581 Secondary hyperparathyroidism of renal origin: Secondary | ICD-10-CM | POA: Diagnosis not present

## 2011-10-14 DIAGNOSIS — Z23 Encounter for immunization: Secondary | ICD-10-CM | POA: Diagnosis not present

## 2011-10-14 DIAGNOSIS — N186 End stage renal disease: Secondary | ICD-10-CM | POA: Diagnosis not present

## 2011-10-14 DIAGNOSIS — D509 Iron deficiency anemia, unspecified: Secondary | ICD-10-CM | POA: Diagnosis not present

## 2011-10-15 DIAGNOSIS — Z23 Encounter for immunization: Secondary | ICD-10-CM | POA: Diagnosis not present

## 2011-10-15 DIAGNOSIS — N186 End stage renal disease: Secondary | ICD-10-CM | POA: Diagnosis not present

## 2011-10-15 DIAGNOSIS — N2581 Secondary hyperparathyroidism of renal origin: Secondary | ICD-10-CM | POA: Diagnosis not present

## 2011-10-15 DIAGNOSIS — D509 Iron deficiency anemia, unspecified: Secondary | ICD-10-CM | POA: Diagnosis not present

## 2011-10-16 DIAGNOSIS — N2581 Secondary hyperparathyroidism of renal origin: Secondary | ICD-10-CM | POA: Diagnosis not present

## 2011-10-16 DIAGNOSIS — Z23 Encounter for immunization: Secondary | ICD-10-CM | POA: Diagnosis not present

## 2011-10-16 DIAGNOSIS — D509 Iron deficiency anemia, unspecified: Secondary | ICD-10-CM | POA: Diagnosis not present

## 2011-10-16 DIAGNOSIS — N186 End stage renal disease: Secondary | ICD-10-CM | POA: Diagnosis not present

## 2011-10-17 DIAGNOSIS — N2581 Secondary hyperparathyroidism of renal origin: Secondary | ICD-10-CM | POA: Diagnosis not present

## 2011-10-17 DIAGNOSIS — Z23 Encounter for immunization: Secondary | ICD-10-CM | POA: Diagnosis not present

## 2011-10-17 DIAGNOSIS — D509 Iron deficiency anemia, unspecified: Secondary | ICD-10-CM | POA: Diagnosis not present

## 2011-10-17 DIAGNOSIS — N186 End stage renal disease: Secondary | ICD-10-CM | POA: Diagnosis not present

## 2011-10-18 DIAGNOSIS — N186 End stage renal disease: Secondary | ICD-10-CM | POA: Diagnosis not present

## 2011-10-18 DIAGNOSIS — N2581 Secondary hyperparathyroidism of renal origin: Secondary | ICD-10-CM | POA: Diagnosis not present

## 2011-10-18 DIAGNOSIS — Z23 Encounter for immunization: Secondary | ICD-10-CM | POA: Diagnosis not present

## 2011-10-18 DIAGNOSIS — D509 Iron deficiency anemia, unspecified: Secondary | ICD-10-CM | POA: Diagnosis not present

## 2011-10-19 DIAGNOSIS — N186 End stage renal disease: Secondary | ICD-10-CM | POA: Diagnosis not present

## 2011-10-19 DIAGNOSIS — Z23 Encounter for immunization: Secondary | ICD-10-CM | POA: Diagnosis not present

## 2011-10-19 DIAGNOSIS — D509 Iron deficiency anemia, unspecified: Secondary | ICD-10-CM | POA: Diagnosis not present

## 2011-10-19 DIAGNOSIS — N2581 Secondary hyperparathyroidism of renal origin: Secondary | ICD-10-CM | POA: Diagnosis not present

## 2011-10-20 DIAGNOSIS — N2581 Secondary hyperparathyroidism of renal origin: Secondary | ICD-10-CM | POA: Diagnosis not present

## 2011-10-20 DIAGNOSIS — Z23 Encounter for immunization: Secondary | ICD-10-CM | POA: Diagnosis not present

## 2011-10-20 DIAGNOSIS — N186 End stage renal disease: Secondary | ICD-10-CM | POA: Diagnosis not present

## 2011-10-20 DIAGNOSIS — D509 Iron deficiency anemia, unspecified: Secondary | ICD-10-CM | POA: Diagnosis not present

## 2011-10-21 DIAGNOSIS — D509 Iron deficiency anemia, unspecified: Secondary | ICD-10-CM | POA: Diagnosis not present

## 2011-10-21 DIAGNOSIS — Z23 Encounter for immunization: Secondary | ICD-10-CM | POA: Diagnosis not present

## 2011-10-21 DIAGNOSIS — N2581 Secondary hyperparathyroidism of renal origin: Secondary | ICD-10-CM | POA: Diagnosis not present

## 2011-10-21 DIAGNOSIS — N186 End stage renal disease: Secondary | ICD-10-CM | POA: Diagnosis not present

## 2011-10-22 DIAGNOSIS — N186 End stage renal disease: Secondary | ICD-10-CM | POA: Diagnosis not present

## 2011-10-22 DIAGNOSIS — Z23 Encounter for immunization: Secondary | ICD-10-CM | POA: Diagnosis not present

## 2011-10-22 DIAGNOSIS — D509 Iron deficiency anemia, unspecified: Secondary | ICD-10-CM | POA: Diagnosis not present

## 2011-10-22 DIAGNOSIS — N2581 Secondary hyperparathyroidism of renal origin: Secondary | ICD-10-CM | POA: Diagnosis not present

## 2011-10-23 DIAGNOSIS — N2581 Secondary hyperparathyroidism of renal origin: Secondary | ICD-10-CM | POA: Diagnosis not present

## 2011-10-23 DIAGNOSIS — N186 End stage renal disease: Secondary | ICD-10-CM | POA: Diagnosis not present

## 2011-10-23 DIAGNOSIS — Z23 Encounter for immunization: Secondary | ICD-10-CM | POA: Diagnosis not present

## 2011-10-23 DIAGNOSIS — D509 Iron deficiency anemia, unspecified: Secondary | ICD-10-CM | POA: Diagnosis not present

## 2011-10-24 DIAGNOSIS — Z23 Encounter for immunization: Secondary | ICD-10-CM | POA: Diagnosis not present

## 2011-10-24 DIAGNOSIS — N2581 Secondary hyperparathyroidism of renal origin: Secondary | ICD-10-CM | POA: Diagnosis not present

## 2011-10-24 DIAGNOSIS — N186 End stage renal disease: Secondary | ICD-10-CM | POA: Diagnosis not present

## 2011-10-24 DIAGNOSIS — D509 Iron deficiency anemia, unspecified: Secondary | ICD-10-CM | POA: Diagnosis not present

## 2011-10-25 DIAGNOSIS — N2581 Secondary hyperparathyroidism of renal origin: Secondary | ICD-10-CM | POA: Diagnosis not present

## 2011-10-25 DIAGNOSIS — N186 End stage renal disease: Secondary | ICD-10-CM | POA: Diagnosis not present

## 2011-10-25 DIAGNOSIS — D509 Iron deficiency anemia, unspecified: Secondary | ICD-10-CM | POA: Diagnosis not present

## 2011-10-25 DIAGNOSIS — Z23 Encounter for immunization: Secondary | ICD-10-CM | POA: Diagnosis not present

## 2011-10-26 DIAGNOSIS — N186 End stage renal disease: Secondary | ICD-10-CM | POA: Diagnosis not present

## 2011-10-26 DIAGNOSIS — D509 Iron deficiency anemia, unspecified: Secondary | ICD-10-CM | POA: Diagnosis not present

## 2011-10-26 DIAGNOSIS — N2581 Secondary hyperparathyroidism of renal origin: Secondary | ICD-10-CM | POA: Diagnosis not present

## 2011-10-26 DIAGNOSIS — Z23 Encounter for immunization: Secondary | ICD-10-CM | POA: Diagnosis not present

## 2011-10-27 DIAGNOSIS — D509 Iron deficiency anemia, unspecified: Secondary | ICD-10-CM | POA: Diagnosis not present

## 2011-10-27 DIAGNOSIS — N186 End stage renal disease: Secondary | ICD-10-CM | POA: Diagnosis not present

## 2011-10-27 DIAGNOSIS — N2581 Secondary hyperparathyroidism of renal origin: Secondary | ICD-10-CM | POA: Diagnosis not present

## 2011-10-28 DIAGNOSIS — N186 End stage renal disease: Secondary | ICD-10-CM | POA: Diagnosis not present

## 2011-10-28 DIAGNOSIS — N2581 Secondary hyperparathyroidism of renal origin: Secondary | ICD-10-CM | POA: Diagnosis not present

## 2011-10-28 DIAGNOSIS — D509 Iron deficiency anemia, unspecified: Secondary | ICD-10-CM | POA: Diagnosis not present

## 2011-10-29 DIAGNOSIS — N2581 Secondary hyperparathyroidism of renal origin: Secondary | ICD-10-CM | POA: Diagnosis not present

## 2011-10-29 DIAGNOSIS — D509 Iron deficiency anemia, unspecified: Secondary | ICD-10-CM | POA: Diagnosis not present

## 2011-10-29 DIAGNOSIS — N186 End stage renal disease: Secondary | ICD-10-CM | POA: Diagnosis not present

## 2011-10-30 DIAGNOSIS — N186 End stage renal disease: Secondary | ICD-10-CM | POA: Diagnosis not present

## 2011-10-30 DIAGNOSIS — N2581 Secondary hyperparathyroidism of renal origin: Secondary | ICD-10-CM | POA: Diagnosis not present

## 2011-10-30 DIAGNOSIS — D509 Iron deficiency anemia, unspecified: Secondary | ICD-10-CM | POA: Diagnosis not present

## 2011-10-31 DIAGNOSIS — D509 Iron deficiency anemia, unspecified: Secondary | ICD-10-CM | POA: Diagnosis not present

## 2011-10-31 DIAGNOSIS — N186 End stage renal disease: Secondary | ICD-10-CM | POA: Diagnosis not present

## 2011-10-31 DIAGNOSIS — N2581 Secondary hyperparathyroidism of renal origin: Secondary | ICD-10-CM | POA: Diagnosis not present

## 2011-11-01 DIAGNOSIS — N186 End stage renal disease: Secondary | ICD-10-CM | POA: Diagnosis not present

## 2011-11-01 DIAGNOSIS — N2581 Secondary hyperparathyroidism of renal origin: Secondary | ICD-10-CM | POA: Diagnosis not present

## 2011-11-01 DIAGNOSIS — D509 Iron deficiency anemia, unspecified: Secondary | ICD-10-CM | POA: Diagnosis not present

## 2011-11-02 ENCOUNTER — Ambulatory Visit (INDEPENDENT_AMBULATORY_CARE_PROVIDER_SITE_OTHER): Payer: Medicare Other | Admitting: Internal Medicine

## 2011-11-02 ENCOUNTER — Encounter: Payer: Self-pay | Admitting: Internal Medicine

## 2011-11-02 VITALS — BP 124/64 | HR 82 | Temp 98.5°F | Ht 64.0 in | Wt 140.5 lb

## 2011-11-02 DIAGNOSIS — N186 End stage renal disease: Secondary | ICD-10-CM | POA: Diagnosis not present

## 2011-11-02 DIAGNOSIS — F329 Major depressive disorder, single episode, unspecified: Secondary | ICD-10-CM | POA: Diagnosis not present

## 2011-11-02 DIAGNOSIS — N185 Chronic kidney disease, stage 5: Secondary | ICD-10-CM

## 2011-11-02 DIAGNOSIS — I10 Essential (primary) hypertension: Secondary | ICD-10-CM | POA: Diagnosis not present

## 2011-11-02 DIAGNOSIS — N2581 Secondary hyperparathyroidism of renal origin: Secondary | ICD-10-CM | POA: Diagnosis not present

## 2011-11-02 DIAGNOSIS — D509 Iron deficiency anemia, unspecified: Secondary | ICD-10-CM | POA: Diagnosis not present

## 2011-11-02 DIAGNOSIS — K589 Irritable bowel syndrome without diarrhea: Secondary | ICD-10-CM | POA: Insufficient documentation

## 2011-11-02 DIAGNOSIS — Z23 Encounter for immunization: Secondary | ICD-10-CM

## 2011-11-02 MED ORDER — ZOSTER VACCINE LIVE 19400 UNT/0.65ML ~~LOC~~ SOLR: 0.6500 mL | Freq: Once | SUBCUTANEOUS | Status: DC

## 2011-11-02 NOTE — Assessment & Plan Note (Signed)
Blood pressure well-controlled on current medications. Will request notes from patient's cardiologist. Followup 6 months or sooner as needed.

## 2011-11-02 NOTE — Assessment & Plan Note (Signed)
Symptoms fairly well-controlled with use of Wellbutrin. Will continue. Followup 6 months or sooner as needed.

## 2011-11-02 NOTE — Assessment & Plan Note (Signed)
Continues on peritoneal dialysis. Doing well. Will request recent labs from her nephrologist. Followup here in 6 months or sooner as needed.

## 2011-11-02 NOTE — Assessment & Plan Note (Signed)
Symptoms are most consistent with irritable bowel syndrome. We discussed potentially adding medication such as Levsin. However, given CKD, decision made to defer additional medications for now.

## 2011-11-02 NOTE — Progress Notes (Signed)
Subjective:    Patient ID: Sharon [name], female    DOB: [birth year], 78 y.o.   MRN: [id number]  HPI 78 year old female with history of chronic kidney disease stage V on peritoneal dialysis, hypertension, depression presents for followup. She reports she is generally doing well. In regards to peritoneal dialysis, she reports she is talking with her nephrologist about taking one day off per week. She is planning to have lab work repeated to check kidney function tomorrow. She denies any recent infections or difficulty with her catheter.  In regards to depression, she reports that symptoms are persistent but manageable on current medication. She is trying to get out and be more active.  Patient also notes a long history of abdominal distention, increased gas, and diarrhea after eating meals. She reports extensive evaluation for this in the past including upper and lower endoscopy which were unrevealing. She questions whether there may be medication to help with symptoms. She used an unknown medication the past with minimal improvement. She has not had any persistent abdominal pain, blood in her stools, fever, chills.  Outpatient Encounter Prescriptions as of 11/02/2011  Medication Sig Dispense Refill  . buPROPion (WELLBUTRIN SR) 150 MG 12 hr tablet Take 1 tablet (150 mg total) by mouth 2 (two) times daily.  60 tablet  3  . ergocalciferol (VITAMIN D2) 50000 UNITS capsule Take 50,000 Units by mouth once a week.      . estrogens, conjugated, (PREMARIN) 0.3 MG tablet Take 1 pill daily  30 tablet  3  . folic acid-vitamin b complex-vitamin c-selenium-zinc (DIALYVITE) 3 MG TABS Take 1 tablet by mouth daily.      . metoprolol succinate (TOPROL-XL) 25 MG 24 hr tablet Take 1 tablet (25 mg total) by mouth daily.  30 tablet  6  . triamterene-hydrochlorothiazide (MAXZIDE) 75-50 MG per tablet Take 1 tablet by mouth 2 (two) times daily.  60 tablet  6  . zoster vaccine live, PF, (ZOSTAVAX) 40981 UNT/0.65ML injection  Inject 19,400 Units into the skin once.  1 each  0   BP 124/64  Pulse 82  Temp 98.5 F (36.9 C) (Oral)  Ht 5\' 4"  (1.626 m)  Wt 140 lb 8 oz (63.73 kg)  BMI 24.12 kg/m2  SpO2 98%  Review of Systems  Constitutional: Negative for fever, chills, appetite change, fatigue and unexpected weight change.  HENT: Negative for ear pain, congestion, sore throat, trouble swallowing, neck pain, voice change and sinus pressure.   Eyes: Negative for visual disturbance.  Respiratory: Negative for cough, shortness of breath, wheezing and stridor.   Cardiovascular: Negative for chest pain, palpitations and leg swelling.  Gastrointestinal: Positive for diarrhea and abdominal distention. Negative for nausea, vomiting, abdominal pain, constipation, blood in stool and anal bleeding.  Genitourinary: Negative for dysuria and flank pain.  Musculoskeletal: Negative for myalgias, arthralgias and gait problem.  Skin: Negative for color change and rash.  Neurological: Negative for dizziness and headaches.  Hematological: Negative for adenopathy. Does not bruise/bleed easily.  Psychiatric/Behavioral: Positive for dysphoric mood. Negative for suicidal ideas and disturbed wake/sleep cycle. The patient is not nervous/anxious.        Objective:   Physical Exam  Constitutional: She is oriented to person, place, and time. She appears well-developed and well-nourished. No distress.  HENT:  Head: Normocephalic and atraumatic.  Right Ear: External ear normal.  Left Ear: External ear normal.  Nose: Nose normal.  Mouth/Throat: Oropharynx is clear and moist. No oropharyngeal exudate.  Eyes: Conjunctivae normal are normal. Pupils  are equal, round, and reactive to light. Right eye exhibits no discharge. Left eye exhibits no discharge. No scleral icterus.  Neck: Normal range of motion. Neck supple. No tracheal deviation present. No thyromegaly present.  Cardiovascular: Normal rate, regular rhythm, normal heart sounds and  intact distal pulses.  Exam reveals no gallop and no friction rub.   No murmur heard. Pulmonary/Chest: Effort normal and breath sounds normal. No respiratory distress. She has no wheezes. She has no rales. She exhibits no tenderness.  Abdominal: Soft. Bowel sounds are normal. She exhibits no distension and no mass. There is tenderness (slight right lower quad). There is no rebound and no guarding.  Musculoskeletal: Normal range of motion. She exhibits no edema and no tenderness.  Lymphadenopathy:    She has no cervical adenopathy.  Neurological: She is alert and oriented to person, place, and time. No cranial nerve deficit. She exhibits normal muscle tone. Coordination normal.  Skin: Skin is warm and dry. No rash noted. She is not diaphoretic. No erythema. No pallor.  Psychiatric: She has a normal mood and affect. Her behavior is normal. Judgment and thought content normal.          Assessment & Plan:

## 2011-11-03 DIAGNOSIS — N2581 Secondary hyperparathyroidism of renal origin: Secondary | ICD-10-CM | POA: Diagnosis not present

## 2011-11-03 DIAGNOSIS — N186 End stage renal disease: Secondary | ICD-10-CM | POA: Diagnosis not present

## 2011-11-03 DIAGNOSIS — D509 Iron deficiency anemia, unspecified: Secondary | ICD-10-CM | POA: Diagnosis not present

## 2011-11-04 DIAGNOSIS — D509 Iron deficiency anemia, unspecified: Secondary | ICD-10-CM | POA: Diagnosis not present

## 2011-11-04 DIAGNOSIS — N186 End stage renal disease: Secondary | ICD-10-CM | POA: Diagnosis not present

## 2011-11-04 DIAGNOSIS — N2581 Secondary hyperparathyroidism of renal origin: Secondary | ICD-10-CM | POA: Diagnosis not present

## 2011-11-05 DIAGNOSIS — N186 End stage renal disease: Secondary | ICD-10-CM | POA: Diagnosis not present

## 2011-11-05 DIAGNOSIS — D509 Iron deficiency anemia, unspecified: Secondary | ICD-10-CM | POA: Diagnosis not present

## 2011-11-05 DIAGNOSIS — N2581 Secondary hyperparathyroidism of renal origin: Secondary | ICD-10-CM | POA: Diagnosis not present

## 2011-11-06 DIAGNOSIS — N186 End stage renal disease: Secondary | ICD-10-CM | POA: Diagnosis not present

## 2011-11-06 DIAGNOSIS — D509 Iron deficiency anemia, unspecified: Secondary | ICD-10-CM | POA: Diagnosis not present

## 2011-11-06 DIAGNOSIS — N2581 Secondary hyperparathyroidism of renal origin: Secondary | ICD-10-CM | POA: Diagnosis not present

## 2011-11-07 DIAGNOSIS — N186 End stage renal disease: Secondary | ICD-10-CM | POA: Diagnosis not present

## 2011-11-07 DIAGNOSIS — N2581 Secondary hyperparathyroidism of renal origin: Secondary | ICD-10-CM | POA: Diagnosis not present

## 2011-11-07 DIAGNOSIS — D509 Iron deficiency anemia, unspecified: Secondary | ICD-10-CM | POA: Diagnosis not present

## 2011-11-08 DIAGNOSIS — N186 End stage renal disease: Secondary | ICD-10-CM | POA: Diagnosis not present

## 2011-11-08 DIAGNOSIS — D509 Iron deficiency anemia, unspecified: Secondary | ICD-10-CM | POA: Diagnosis not present

## 2011-11-08 DIAGNOSIS — N2581 Secondary hyperparathyroidism of renal origin: Secondary | ICD-10-CM | POA: Diagnosis not present

## 2011-11-09 DIAGNOSIS — D509 Iron deficiency anemia, unspecified: Secondary | ICD-10-CM | POA: Diagnosis not present

## 2011-11-09 DIAGNOSIS — N2581 Secondary hyperparathyroidism of renal origin: Secondary | ICD-10-CM | POA: Diagnosis not present

## 2011-11-09 DIAGNOSIS — N186 End stage renal disease: Secondary | ICD-10-CM | POA: Diagnosis not present

## 2011-11-10 DIAGNOSIS — N2581 Secondary hyperparathyroidism of renal origin: Secondary | ICD-10-CM | POA: Diagnosis not present

## 2011-11-10 DIAGNOSIS — N186 End stage renal disease: Secondary | ICD-10-CM | POA: Diagnosis not present

## 2011-11-10 DIAGNOSIS — D509 Iron deficiency anemia, unspecified: Secondary | ICD-10-CM | POA: Diagnosis not present

## 2011-11-10 DIAGNOSIS — I4891 Unspecified atrial fibrillation: Secondary | ICD-10-CM | POA: Diagnosis not present

## 2011-11-11 DIAGNOSIS — N186 End stage renal disease: Secondary | ICD-10-CM | POA: Diagnosis not present

## 2011-11-11 DIAGNOSIS — D509 Iron deficiency anemia, unspecified: Secondary | ICD-10-CM | POA: Diagnosis not present

## 2011-11-11 DIAGNOSIS — N2581 Secondary hyperparathyroidism of renal origin: Secondary | ICD-10-CM | POA: Diagnosis not present

## 2011-11-12 DIAGNOSIS — N186 End stage renal disease: Secondary | ICD-10-CM | POA: Diagnosis not present

## 2011-11-12 DIAGNOSIS — N2581 Secondary hyperparathyroidism of renal origin: Secondary | ICD-10-CM | POA: Diagnosis not present

## 2011-11-12 DIAGNOSIS — D509 Iron deficiency anemia, unspecified: Secondary | ICD-10-CM | POA: Diagnosis not present

## 2011-11-13 DIAGNOSIS — D509 Iron deficiency anemia, unspecified: Secondary | ICD-10-CM | POA: Diagnosis not present

## 2011-11-13 DIAGNOSIS — N186 End stage renal disease: Secondary | ICD-10-CM | POA: Diagnosis not present

## 2011-11-13 DIAGNOSIS — N2581 Secondary hyperparathyroidism of renal origin: Secondary | ICD-10-CM | POA: Diagnosis not present

## 2011-11-14 DIAGNOSIS — N2581 Secondary hyperparathyroidism of renal origin: Secondary | ICD-10-CM | POA: Diagnosis not present

## 2011-11-14 DIAGNOSIS — N186 End stage renal disease: Secondary | ICD-10-CM | POA: Diagnosis not present

## 2011-11-14 DIAGNOSIS — D509 Iron deficiency anemia, unspecified: Secondary | ICD-10-CM | POA: Diagnosis not present

## 2011-11-15 DIAGNOSIS — D509 Iron deficiency anemia, unspecified: Secondary | ICD-10-CM | POA: Diagnosis not present

## 2011-11-15 DIAGNOSIS — N186 End stage renal disease: Secondary | ICD-10-CM | POA: Diagnosis not present

## 2011-11-15 DIAGNOSIS — N2581 Secondary hyperparathyroidism of renal origin: Secondary | ICD-10-CM | POA: Diagnosis not present

## 2011-11-16 DIAGNOSIS — N2581 Secondary hyperparathyroidism of renal origin: Secondary | ICD-10-CM | POA: Diagnosis not present

## 2011-11-16 DIAGNOSIS — N186 End stage renal disease: Secondary | ICD-10-CM | POA: Diagnosis not present

## 2011-11-16 DIAGNOSIS — D509 Iron deficiency anemia, unspecified: Secondary | ICD-10-CM | POA: Diagnosis not present

## 2011-11-17 DIAGNOSIS — D509 Iron deficiency anemia, unspecified: Secondary | ICD-10-CM | POA: Diagnosis not present

## 2011-11-17 DIAGNOSIS — N186 End stage renal disease: Secondary | ICD-10-CM | POA: Diagnosis not present

## 2011-11-17 DIAGNOSIS — N2581 Secondary hyperparathyroidism of renal origin: Secondary | ICD-10-CM | POA: Diagnosis not present

## 2011-11-18 DIAGNOSIS — N186 End stage renal disease: Secondary | ICD-10-CM | POA: Diagnosis not present

## 2011-11-18 DIAGNOSIS — N2581 Secondary hyperparathyroidism of renal origin: Secondary | ICD-10-CM | POA: Diagnosis not present

## 2011-11-18 DIAGNOSIS — D509 Iron deficiency anemia, unspecified: Secondary | ICD-10-CM | POA: Diagnosis not present

## 2011-11-19 DIAGNOSIS — N186 End stage renal disease: Secondary | ICD-10-CM | POA: Diagnosis not present

## 2011-11-19 DIAGNOSIS — N2581 Secondary hyperparathyroidism of renal origin: Secondary | ICD-10-CM | POA: Diagnosis not present

## 2011-11-19 DIAGNOSIS — D509 Iron deficiency anemia, unspecified: Secondary | ICD-10-CM | POA: Diagnosis not present

## 2011-11-20 DIAGNOSIS — N2581 Secondary hyperparathyroidism of renal origin: Secondary | ICD-10-CM | POA: Diagnosis not present

## 2011-11-20 DIAGNOSIS — D509 Iron deficiency anemia, unspecified: Secondary | ICD-10-CM | POA: Diagnosis not present

## 2011-11-20 DIAGNOSIS — N186 End stage renal disease: Secondary | ICD-10-CM | POA: Diagnosis not present

## 2011-11-21 DIAGNOSIS — N186 End stage renal disease: Secondary | ICD-10-CM | POA: Diagnosis not present

## 2011-11-21 DIAGNOSIS — D509 Iron deficiency anemia, unspecified: Secondary | ICD-10-CM | POA: Diagnosis not present

## 2011-11-21 DIAGNOSIS — N2581 Secondary hyperparathyroidism of renal origin: Secondary | ICD-10-CM | POA: Diagnosis not present

## 2011-11-22 DIAGNOSIS — N186 End stage renal disease: Secondary | ICD-10-CM | POA: Diagnosis not present

## 2011-11-22 DIAGNOSIS — N2581 Secondary hyperparathyroidism of renal origin: Secondary | ICD-10-CM | POA: Diagnosis not present

## 2011-11-22 DIAGNOSIS — D509 Iron deficiency anemia, unspecified: Secondary | ICD-10-CM | POA: Diagnosis not present

## 2011-11-23 DIAGNOSIS — N186 End stage renal disease: Secondary | ICD-10-CM | POA: Diagnosis not present

## 2011-11-23 DIAGNOSIS — D509 Iron deficiency anemia, unspecified: Secondary | ICD-10-CM | POA: Diagnosis not present

## 2011-11-23 DIAGNOSIS — N2581 Secondary hyperparathyroidism of renal origin: Secondary | ICD-10-CM | POA: Diagnosis not present

## 2011-11-24 DIAGNOSIS — N2581 Secondary hyperparathyroidism of renal origin: Secondary | ICD-10-CM | POA: Diagnosis not present

## 2011-11-24 DIAGNOSIS — D509 Iron deficiency anemia, unspecified: Secondary | ICD-10-CM | POA: Diagnosis not present

## 2011-11-24 DIAGNOSIS — N186 End stage renal disease: Secondary | ICD-10-CM | POA: Diagnosis not present

## 2011-11-25 DIAGNOSIS — D509 Iron deficiency anemia, unspecified: Secondary | ICD-10-CM | POA: Diagnosis not present

## 2011-11-25 DIAGNOSIS — N186 End stage renal disease: Secondary | ICD-10-CM | POA: Diagnosis not present

## 2011-11-25 DIAGNOSIS — N2581 Secondary hyperparathyroidism of renal origin: Secondary | ICD-10-CM | POA: Diagnosis not present

## 2011-11-26 DIAGNOSIS — N2581 Secondary hyperparathyroidism of renal origin: Secondary | ICD-10-CM | POA: Diagnosis not present

## 2011-11-26 DIAGNOSIS — N186 End stage renal disease: Secondary | ICD-10-CM | POA: Diagnosis not present

## 2011-11-26 DIAGNOSIS — D509 Iron deficiency anemia, unspecified: Secondary | ICD-10-CM | POA: Diagnosis not present

## 2011-11-27 DIAGNOSIS — N186 End stage renal disease: Secondary | ICD-10-CM | POA: Diagnosis not present

## 2011-11-27 DIAGNOSIS — D509 Iron deficiency anemia, unspecified: Secondary | ICD-10-CM | POA: Diagnosis not present

## 2011-11-28 DIAGNOSIS — D509 Iron deficiency anemia, unspecified: Secondary | ICD-10-CM | POA: Diagnosis not present

## 2011-11-28 DIAGNOSIS — N186 End stage renal disease: Secondary | ICD-10-CM | POA: Diagnosis not present

## 2011-11-29 DIAGNOSIS — N186 End stage renal disease: Secondary | ICD-10-CM | POA: Diagnosis not present

## 2011-11-29 DIAGNOSIS — D509 Iron deficiency anemia, unspecified: Secondary | ICD-10-CM | POA: Diagnosis not present

## 2011-11-30 DIAGNOSIS — D509 Iron deficiency anemia, unspecified: Secondary | ICD-10-CM | POA: Diagnosis not present

## 2011-11-30 DIAGNOSIS — N186 End stage renal disease: Secondary | ICD-10-CM | POA: Diagnosis not present

## 2011-12-01 DIAGNOSIS — N186 End stage renal disease: Secondary | ICD-10-CM | POA: Diagnosis not present

## 2011-12-01 DIAGNOSIS — D509 Iron deficiency anemia, unspecified: Secondary | ICD-10-CM | POA: Diagnosis not present

## 2011-12-02 DIAGNOSIS — D509 Iron deficiency anemia, unspecified: Secondary | ICD-10-CM | POA: Diagnosis not present

## 2011-12-02 DIAGNOSIS — N186 End stage renal disease: Secondary | ICD-10-CM | POA: Diagnosis not present

## 2011-12-03 DIAGNOSIS — D509 Iron deficiency anemia, unspecified: Secondary | ICD-10-CM | POA: Diagnosis not present

## 2011-12-03 DIAGNOSIS — N186 End stage renal disease: Secondary | ICD-10-CM | POA: Diagnosis not present

## 2011-12-04 DIAGNOSIS — D509 Iron deficiency anemia, unspecified: Secondary | ICD-10-CM | POA: Diagnosis not present

## 2011-12-04 DIAGNOSIS — N186 End stage renal disease: Secondary | ICD-10-CM | POA: Diagnosis not present

## 2011-12-05 DIAGNOSIS — D509 Iron deficiency anemia, unspecified: Secondary | ICD-10-CM | POA: Diagnosis not present

## 2011-12-05 DIAGNOSIS — N186 End stage renal disease: Secondary | ICD-10-CM | POA: Diagnosis not present

## 2011-12-06 DIAGNOSIS — D509 Iron deficiency anemia, unspecified: Secondary | ICD-10-CM | POA: Diagnosis not present

## 2011-12-06 DIAGNOSIS — N186 End stage renal disease: Secondary | ICD-10-CM | POA: Diagnosis not present

## 2011-12-07 DIAGNOSIS — N186 End stage renal disease: Secondary | ICD-10-CM | POA: Diagnosis not present

## 2011-12-07 DIAGNOSIS — D509 Iron deficiency anemia, unspecified: Secondary | ICD-10-CM | POA: Diagnosis not present

## 2011-12-08 DIAGNOSIS — N186 End stage renal disease: Secondary | ICD-10-CM | POA: Diagnosis not present

## 2011-12-08 DIAGNOSIS — D509 Iron deficiency anemia, unspecified: Secondary | ICD-10-CM | POA: Diagnosis not present

## 2011-12-09 DIAGNOSIS — D509 Iron deficiency anemia, unspecified: Secondary | ICD-10-CM | POA: Diagnosis not present

## 2011-12-09 DIAGNOSIS — N186 End stage renal disease: Secondary | ICD-10-CM | POA: Diagnosis not present

## 2011-12-10 DIAGNOSIS — N186 End stage renal disease: Secondary | ICD-10-CM | POA: Diagnosis not present

## 2011-12-10 DIAGNOSIS — D509 Iron deficiency anemia, unspecified: Secondary | ICD-10-CM | POA: Diagnosis not present

## 2011-12-11 DIAGNOSIS — D509 Iron deficiency anemia, unspecified: Secondary | ICD-10-CM | POA: Diagnosis not present

## 2011-12-11 DIAGNOSIS — N186 End stage renal disease: Secondary | ICD-10-CM | POA: Diagnosis not present

## 2011-12-12 DIAGNOSIS — N186 End stage renal disease: Secondary | ICD-10-CM | POA: Diagnosis not present

## 2011-12-12 DIAGNOSIS — D509 Iron deficiency anemia, unspecified: Secondary | ICD-10-CM | POA: Diagnosis not present

## 2011-12-13 DIAGNOSIS — N186 End stage renal disease: Secondary | ICD-10-CM | POA: Diagnosis not present

## 2011-12-13 DIAGNOSIS — D509 Iron deficiency anemia, unspecified: Secondary | ICD-10-CM | POA: Diagnosis not present

## 2011-12-14 DIAGNOSIS — D509 Iron deficiency anemia, unspecified: Secondary | ICD-10-CM | POA: Diagnosis not present

## 2011-12-14 DIAGNOSIS — N186 End stage renal disease: Secondary | ICD-10-CM | POA: Diagnosis not present

## 2011-12-15 DIAGNOSIS — N186 End stage renal disease: Secondary | ICD-10-CM | POA: Diagnosis not present

## 2011-12-15 DIAGNOSIS — D509 Iron deficiency anemia, unspecified: Secondary | ICD-10-CM | POA: Diagnosis not present

## 2011-12-16 DIAGNOSIS — D509 Iron deficiency anemia, unspecified: Secondary | ICD-10-CM | POA: Diagnosis not present

## 2011-12-16 DIAGNOSIS — N186 End stage renal disease: Secondary | ICD-10-CM | POA: Diagnosis not present

## 2011-12-17 DIAGNOSIS — D509 Iron deficiency anemia, unspecified: Secondary | ICD-10-CM | POA: Diagnosis not present

## 2011-12-17 DIAGNOSIS — N186 End stage renal disease: Secondary | ICD-10-CM | POA: Diagnosis not present

## 2011-12-18 DIAGNOSIS — D509 Iron deficiency anemia, unspecified: Secondary | ICD-10-CM | POA: Diagnosis not present

## 2011-12-18 DIAGNOSIS — N186 End stage renal disease: Secondary | ICD-10-CM | POA: Diagnosis not present

## 2011-12-19 DIAGNOSIS — N186 End stage renal disease: Secondary | ICD-10-CM | POA: Diagnosis not present

## 2011-12-19 DIAGNOSIS — D509 Iron deficiency anemia, unspecified: Secondary | ICD-10-CM | POA: Diagnosis not present

## 2011-12-20 DIAGNOSIS — D509 Iron deficiency anemia, unspecified: Secondary | ICD-10-CM | POA: Diagnosis not present

## 2011-12-20 DIAGNOSIS — N186 End stage renal disease: Secondary | ICD-10-CM | POA: Diagnosis not present

## 2011-12-21 DIAGNOSIS — N186 End stage renal disease: Secondary | ICD-10-CM | POA: Diagnosis not present

## 2011-12-21 DIAGNOSIS — D509 Iron deficiency anemia, unspecified: Secondary | ICD-10-CM | POA: Diagnosis not present

## 2011-12-22 DIAGNOSIS — N186 End stage renal disease: Secondary | ICD-10-CM | POA: Diagnosis not present

## 2011-12-22 DIAGNOSIS — D509 Iron deficiency anemia, unspecified: Secondary | ICD-10-CM | POA: Diagnosis not present

## 2011-12-23 DIAGNOSIS — D509 Iron deficiency anemia, unspecified: Secondary | ICD-10-CM | POA: Diagnosis not present

## 2011-12-23 DIAGNOSIS — N186 End stage renal disease: Secondary | ICD-10-CM | POA: Diagnosis not present

## 2011-12-24 DIAGNOSIS — D509 Iron deficiency anemia, unspecified: Secondary | ICD-10-CM | POA: Diagnosis not present

## 2011-12-24 DIAGNOSIS — N186 End stage renal disease: Secondary | ICD-10-CM | POA: Diagnosis not present

## 2011-12-25 DIAGNOSIS — N186 End stage renal disease: Secondary | ICD-10-CM | POA: Diagnosis not present

## 2011-12-25 DIAGNOSIS — D509 Iron deficiency anemia, unspecified: Secondary | ICD-10-CM | POA: Diagnosis not present

## 2011-12-26 DIAGNOSIS — N186 End stage renal disease: Secondary | ICD-10-CM | POA: Diagnosis not present

## 2011-12-26 DIAGNOSIS — D509 Iron deficiency anemia, unspecified: Secondary | ICD-10-CM | POA: Diagnosis not present

## 2011-12-27 DIAGNOSIS — N186 End stage renal disease: Secondary | ICD-10-CM | POA: Diagnosis not present

## 2011-12-28 DIAGNOSIS — N2581 Secondary hyperparathyroidism of renal origin: Secondary | ICD-10-CM | POA: Diagnosis not present

## 2011-12-28 DIAGNOSIS — N186 End stage renal disease: Secondary | ICD-10-CM | POA: Diagnosis not present

## 2011-12-28 DIAGNOSIS — D509 Iron deficiency anemia, unspecified: Secondary | ICD-10-CM | POA: Diagnosis not present

## 2011-12-29 DIAGNOSIS — D509 Iron deficiency anemia, unspecified: Secondary | ICD-10-CM | POA: Diagnosis not present

## 2011-12-29 DIAGNOSIS — N2581 Secondary hyperparathyroidism of renal origin: Secondary | ICD-10-CM | POA: Diagnosis not present

## 2011-12-29 DIAGNOSIS — N186 End stage renal disease: Secondary | ICD-10-CM | POA: Diagnosis not present

## 2011-12-30 DIAGNOSIS — N2581 Secondary hyperparathyroidism of renal origin: Secondary | ICD-10-CM | POA: Diagnosis not present

## 2011-12-30 DIAGNOSIS — N186 End stage renal disease: Secondary | ICD-10-CM | POA: Diagnosis not present

## 2011-12-30 DIAGNOSIS — D509 Iron deficiency anemia, unspecified: Secondary | ICD-10-CM | POA: Diagnosis not present

## 2011-12-30 DIAGNOSIS — E785 Hyperlipidemia, unspecified: Secondary | ICD-10-CM | POA: Diagnosis not present

## 2011-12-31 DIAGNOSIS — N2581 Secondary hyperparathyroidism of renal origin: Secondary | ICD-10-CM | POA: Diagnosis not present

## 2011-12-31 DIAGNOSIS — D509 Iron deficiency anemia, unspecified: Secondary | ICD-10-CM | POA: Diagnosis not present

## 2011-12-31 DIAGNOSIS — N186 End stage renal disease: Secondary | ICD-10-CM | POA: Diagnosis not present

## 2012-01-01 DIAGNOSIS — N2581 Secondary hyperparathyroidism of renal origin: Secondary | ICD-10-CM | POA: Diagnosis not present

## 2012-01-01 DIAGNOSIS — N186 End stage renal disease: Secondary | ICD-10-CM | POA: Diagnosis not present

## 2012-01-01 DIAGNOSIS — D509 Iron deficiency anemia, unspecified: Secondary | ICD-10-CM | POA: Diagnosis not present

## 2012-01-02 DIAGNOSIS — N186 End stage renal disease: Secondary | ICD-10-CM | POA: Diagnosis not present

## 2012-01-02 DIAGNOSIS — N2581 Secondary hyperparathyroidism of renal origin: Secondary | ICD-10-CM | POA: Diagnosis not present

## 2012-01-02 DIAGNOSIS — D509 Iron deficiency anemia, unspecified: Secondary | ICD-10-CM | POA: Diagnosis not present

## 2012-01-04 DIAGNOSIS — D509 Iron deficiency anemia, unspecified: Secondary | ICD-10-CM | POA: Diagnosis not present

## 2012-01-04 DIAGNOSIS — N186 End stage renal disease: Secondary | ICD-10-CM | POA: Diagnosis not present

## 2012-01-04 DIAGNOSIS — N2581 Secondary hyperparathyroidism of renal origin: Secondary | ICD-10-CM | POA: Diagnosis not present

## 2012-01-05 DIAGNOSIS — D509 Iron deficiency anemia, unspecified: Secondary | ICD-10-CM | POA: Diagnosis not present

## 2012-01-05 DIAGNOSIS — N2581 Secondary hyperparathyroidism of renal origin: Secondary | ICD-10-CM | POA: Diagnosis not present

## 2012-01-05 DIAGNOSIS — N186 End stage renal disease: Secondary | ICD-10-CM | POA: Diagnosis not present

## 2012-01-06 DIAGNOSIS — N2581 Secondary hyperparathyroidism of renal origin: Secondary | ICD-10-CM | POA: Diagnosis not present

## 2012-01-06 DIAGNOSIS — D509 Iron deficiency anemia, unspecified: Secondary | ICD-10-CM | POA: Diagnosis not present

## 2012-01-06 DIAGNOSIS — N186 End stage renal disease: Secondary | ICD-10-CM | POA: Diagnosis not present

## 2012-01-07 DIAGNOSIS — N186 End stage renal disease: Secondary | ICD-10-CM | POA: Diagnosis not present

## 2012-01-07 DIAGNOSIS — N2581 Secondary hyperparathyroidism of renal origin: Secondary | ICD-10-CM | POA: Diagnosis not present

## 2012-01-07 DIAGNOSIS — D509 Iron deficiency anemia, unspecified: Secondary | ICD-10-CM | POA: Diagnosis not present

## 2012-01-08 DIAGNOSIS — N2581 Secondary hyperparathyroidism of renal origin: Secondary | ICD-10-CM | POA: Diagnosis not present

## 2012-01-08 DIAGNOSIS — D509 Iron deficiency anemia, unspecified: Secondary | ICD-10-CM | POA: Diagnosis not present

## 2012-01-08 DIAGNOSIS — N186 End stage renal disease: Secondary | ICD-10-CM | POA: Diagnosis not present

## 2012-01-09 DIAGNOSIS — D509 Iron deficiency anemia, unspecified: Secondary | ICD-10-CM | POA: Diagnosis not present

## 2012-01-09 DIAGNOSIS — N186 End stage renal disease: Secondary | ICD-10-CM | POA: Diagnosis not present

## 2012-01-09 DIAGNOSIS — N2581 Secondary hyperparathyroidism of renal origin: Secondary | ICD-10-CM | POA: Diagnosis not present

## 2012-01-11 DIAGNOSIS — N186 End stage renal disease: Secondary | ICD-10-CM | POA: Diagnosis not present

## 2012-01-11 DIAGNOSIS — D509 Iron deficiency anemia, unspecified: Secondary | ICD-10-CM | POA: Diagnosis not present

## 2012-01-11 DIAGNOSIS — N2581 Secondary hyperparathyroidism of renal origin: Secondary | ICD-10-CM | POA: Diagnosis not present

## 2012-01-12 DIAGNOSIS — N2581 Secondary hyperparathyroidism of renal origin: Secondary | ICD-10-CM | POA: Diagnosis not present

## 2012-01-12 DIAGNOSIS — D509 Iron deficiency anemia, unspecified: Secondary | ICD-10-CM | POA: Diagnosis not present

## 2012-01-12 DIAGNOSIS — N186 End stage renal disease: Secondary | ICD-10-CM | POA: Diagnosis not present

## 2012-01-13 DIAGNOSIS — D509 Iron deficiency anemia, unspecified: Secondary | ICD-10-CM | POA: Diagnosis not present

## 2012-01-13 DIAGNOSIS — N2581 Secondary hyperparathyroidism of renal origin: Secondary | ICD-10-CM | POA: Diagnosis not present

## 2012-01-13 DIAGNOSIS — N186 End stage renal disease: Secondary | ICD-10-CM | POA: Diagnosis not present

## 2012-01-14 DIAGNOSIS — N186 End stage renal disease: Secondary | ICD-10-CM | POA: Diagnosis not present

## 2012-01-14 DIAGNOSIS — D509 Iron deficiency anemia, unspecified: Secondary | ICD-10-CM | POA: Diagnosis not present

## 2012-01-14 DIAGNOSIS — N2581 Secondary hyperparathyroidism of renal origin: Secondary | ICD-10-CM | POA: Diagnosis not present

## 2012-01-15 DIAGNOSIS — N186 End stage renal disease: Secondary | ICD-10-CM | POA: Diagnosis not present

## 2012-01-15 DIAGNOSIS — D509 Iron deficiency anemia, unspecified: Secondary | ICD-10-CM | POA: Diagnosis not present

## 2012-01-15 DIAGNOSIS — N2581 Secondary hyperparathyroidism of renal origin: Secondary | ICD-10-CM | POA: Diagnosis not present

## 2012-01-16 DIAGNOSIS — N2581 Secondary hyperparathyroidism of renal origin: Secondary | ICD-10-CM | POA: Diagnosis not present

## 2012-01-16 DIAGNOSIS — D509 Iron deficiency anemia, unspecified: Secondary | ICD-10-CM | POA: Diagnosis not present

## 2012-01-16 DIAGNOSIS — N186 End stage renal disease: Secondary | ICD-10-CM | POA: Diagnosis not present

## 2012-01-18 DIAGNOSIS — D509 Iron deficiency anemia, unspecified: Secondary | ICD-10-CM | POA: Diagnosis not present

## 2012-01-18 DIAGNOSIS — N186 End stage renal disease: Secondary | ICD-10-CM | POA: Diagnosis not present

## 2012-01-18 DIAGNOSIS — N2581 Secondary hyperparathyroidism of renal origin: Secondary | ICD-10-CM | POA: Diagnosis not present

## 2012-01-19 DIAGNOSIS — N2581 Secondary hyperparathyroidism of renal origin: Secondary | ICD-10-CM | POA: Diagnosis not present

## 2012-01-19 DIAGNOSIS — N186 End stage renal disease: Secondary | ICD-10-CM | POA: Diagnosis not present

## 2012-01-19 DIAGNOSIS — D509 Iron deficiency anemia, unspecified: Secondary | ICD-10-CM | POA: Diagnosis not present

## 2012-01-20 DIAGNOSIS — N2581 Secondary hyperparathyroidism of renal origin: Secondary | ICD-10-CM | POA: Diagnosis not present

## 2012-01-20 DIAGNOSIS — D509 Iron deficiency anemia, unspecified: Secondary | ICD-10-CM | POA: Diagnosis not present

## 2012-01-20 DIAGNOSIS — N186 End stage renal disease: Secondary | ICD-10-CM | POA: Diagnosis not present

## 2012-01-21 DIAGNOSIS — N186 End stage renal disease: Secondary | ICD-10-CM | POA: Diagnosis not present

## 2012-01-21 DIAGNOSIS — D509 Iron deficiency anemia, unspecified: Secondary | ICD-10-CM | POA: Diagnosis not present

## 2012-01-21 DIAGNOSIS — N2581 Secondary hyperparathyroidism of renal origin: Secondary | ICD-10-CM | POA: Diagnosis not present

## 2012-01-22 ENCOUNTER — Ambulatory Visit (INDEPENDENT_AMBULATORY_CARE_PROVIDER_SITE_OTHER): Payer: Medicare Other | Admitting: Internal Medicine

## 2012-01-22 ENCOUNTER — Encounter: Payer: Self-pay | Admitting: Internal Medicine

## 2012-01-22 VITALS — BP 120/72 | HR 71 | Temp 98.2°F | Resp 15 | Wt 138.5 lb

## 2012-01-22 DIAGNOSIS — D509 Iron deficiency anemia, unspecified: Secondary | ICD-10-CM | POA: Diagnosis not present

## 2012-01-22 DIAGNOSIS — N2581 Secondary hyperparathyroidism of renal origin: Secondary | ICD-10-CM | POA: Diagnosis not present

## 2012-01-22 DIAGNOSIS — N185 Chronic kidney disease, stage 5: Secondary | ICD-10-CM | POA: Diagnosis not present

## 2012-01-22 DIAGNOSIS — K589 Irritable bowel syndrome without diarrhea: Secondary | ICD-10-CM

## 2012-01-22 DIAGNOSIS — N186 End stage renal disease: Secondary | ICD-10-CM | POA: Diagnosis not present

## 2012-01-22 DIAGNOSIS — J40 Bronchitis, not specified as acute or chronic: Secondary | ICD-10-CM

## 2012-01-22 MED ORDER — CIPROFLOXACIN HCL 250 MG PO TABS: 250.0000 mg | ORAL_TABLET | Freq: Every day | ORAL | Status: DC

## 2012-01-22 MED ORDER — BENZONATATE 100 MG PO CAPS: 100.0000 mg | ORAL_CAPSULE | Freq: Two times a day (BID) | ORAL | Status: DC | PRN

## 2012-01-22 NOTE — Assessment & Plan Note (Signed)
Encouraged her to follow up with her nephrologist if she is having difficulty maintaining PD while ill. May need admission if unable to perform PD over next few days.

## 2012-01-22 NOTE — Progress Notes (Signed)
Subjective:    Patient ID: Sharon [name], female    DOB: [birth year], 78 y.o.   MRN: [id number]  HPI 78 year old female with history of chronic kidney disease on peritoneal dialysis presents for acute visit complaining of three-day history of fever, chills, myalgia, cough productive of purulent sputum, and fatigue. She also notes some intermittent abdominal cramping and discomfort. She is having some intermittent episodes of nonbloody diarrhea. Abdominal cramping preceded symptoms of fever and chills. She has not had any blood in her stool. She denies any problems with her dialysis catheter. However, she was so fatigued yesterday but she did not complete her dialysis. She has not been taking any medication for her symptoms. She has tried to increase her fluid intake.  Outpatient Encounter Prescriptions as of 01/22/2012  Medication Sig Dispense Refill  . buPROPion (WELLBUTRIN SR) 150 MG 12 hr tablet Take 1 tablet (150 mg total) by mouth 2 (two) times daily.  60 tablet  3  . ergocalciferol (VITAMIN D2) 50000 UNITS capsule Take 50,000 Units by mouth once a week.      . folic acid-vitamin b complex-vitamin c-selenium-zinc (DIALYVITE) 3 MG TABS Take 1 tablet by mouth daily.      . metoprolol succinate (TOPROL-XL) 25 MG 24 hr tablet Take 1 tablet (25 mg total) by mouth daily.  30 tablet  6  . triamterene-hydrochlorothiazide (MAXZIDE) 75-50 MG per tablet Take 1 tablet by mouth daily.      Marland Kitchen zoster vaccine live, PF, (ZOSTAVAX) 40981 UNT/0.65ML injection Inject 19,400 Units into the skin once.  1 each  0  . [DISCONTINUED] triamterene-hydrochlorothiazide (MAXZIDE) 75-50 MG per tablet Take 1 tablet by mouth 2 (two) times daily.  60 tablet  6  . benzonatate (TESSALON) 100 MG capsule Take 1 capsule (100 mg total) by mouth 2 (two) times daily as needed for cough.  20 capsule  0  . ciprofloxacin (CIPRO) 250 MG tablet Take 1 tablet (250 mg total) by mouth daily.  10 tablet  0  . estrogens, conjugated, (PREMARIN)  0.3 MG tablet Take 1 pill daily  30 tablet  3   BP 120/72  Pulse 71  Temp 98.2 F (36.8 C) (Oral)  Resp 15  Wt 138 lb 8 oz (62.823 kg)  SpO2 98%  Review of Systems  Constitutional: Positive for fever, chills and fatigue. Negative for appetite change and unexpected weight change.  HENT: Positive for congestion, sore throat, rhinorrhea and postnasal drip. Negative for ear pain, trouble swallowing, neck pain, voice change and sinus pressure.   Eyes: Negative for visual disturbance.  Respiratory: Positive for cough. Negative for shortness of breath, wheezing and stridor.   Cardiovascular: Negative for chest pain, palpitations and leg swelling.  Gastrointestinal: Positive for abdominal pain and diarrhea. Negative for nausea, vomiting, constipation, blood in stool, abdominal distention and anal bleeding.  Genitourinary: Negative for dysuria, urgency, frequency, flank pain and decreased urine volume.  Musculoskeletal: Positive for myalgias. Negative for arthralgias and gait problem.  Skin: Negative for color change and rash.  Neurological: Negative for dizziness and headaches.  Hematological: Negative for adenopathy. Does not bruise/bleed easily.  Psychiatric/Behavioral: Negative for suicidal ideas, sleep disturbance and dysphoric mood. The patient is not nervous/anxious.        Objective:   Physical Exam  Constitutional: She is oriented to person, place, and time. She appears well-developed and well-nourished. No distress.  HENT:  Head: Normocephalic and atraumatic.  Right Ear: External ear normal. Tympanic membrane is not erythematous and not bulging. A middle  ear effusion is present.  Left Ear: External ear normal. Tympanic membrane is not erythematous and not bulging. A middle ear effusion is present.  Nose: Nose normal.  Mouth/Throat: Oropharynx is clear and moist. No oropharyngeal exudate.  Eyes: Conjunctivae normal are normal. Pupils are equal, round, and reactive to light. Right  eye exhibits no discharge. Left eye exhibits no discharge. No scleral icterus.  Neck: Normal range of motion. Neck supple. No tracheal deviation present. No thyromegaly present.  Cardiovascular: Normal rate, regular rhythm, normal heart sounds and intact distal pulses.  Exam reveals no gallop and no friction rub.   No murmur heard. Pulmonary/Chest: Effort normal and breath sounds normal. No accessory muscle usage. Not tachypneic. No respiratory distress. She has no decreased breath sounds. She has no wheezes. She has no rhonchi. She has no rales. She exhibits no tenderness.  Abdominal: Soft. Normal appearance and bowel sounds are normal. She exhibits no distension and no mass. There is tenderness (RLQ mild). There is no rebound and no guarding.    Musculoskeletal: Normal range of motion. She exhibits no edema and no tenderness.  Lymphadenopathy:    She has no cervical adenopathy.  Neurological: She is alert and oriented to person, place, and time. No cranial nerve deficit. She exhibits normal muscle tone. Coordination normal.  Skin: Skin is warm and dry. No rash noted. She is not diaphoretic. No erythema. No pallor.  Psychiatric: She has a normal mood and affect. Her behavior is normal. Judgment and thought content normal.          Assessment & Plan:

## 2012-01-22 NOTE — Assessment & Plan Note (Signed)
Symptoms are most consistent with influenza however flu test is negative today. Patient is at high risk for secondary bacterial infection and has some symptoms to suggest early bronchitis. Will treat empirically with cipro, as she has tolerated this well in the past. If any shortness of breath, chest pain, persistent fever, or worsening abdominal pain, she will need to be evaluated over the weekend. Encouraged her to rest and increase fluid intake.  She will also be in contact with her nephrologist if having difficulty maintaining PD while ill. Follow up 1 week and prn.

## 2012-01-22 NOTE — Assessment & Plan Note (Signed)
Symptoms recently worsening. Discussed setting up GI evaluation, but will hold off until acute illness has resolved. Follow up 1 week.

## 2012-01-23 DIAGNOSIS — N186 End stage renal disease: Secondary | ICD-10-CM | POA: Diagnosis not present

## 2012-01-23 DIAGNOSIS — N2581 Secondary hyperparathyroidism of renal origin: Secondary | ICD-10-CM | POA: Diagnosis not present

## 2012-01-23 DIAGNOSIS — D509 Iron deficiency anemia, unspecified: Secondary | ICD-10-CM | POA: Diagnosis not present

## 2012-01-25 DIAGNOSIS — N186 End stage renal disease: Secondary | ICD-10-CM | POA: Diagnosis not present

## 2012-01-25 DIAGNOSIS — N2581 Secondary hyperparathyroidism of renal origin: Secondary | ICD-10-CM | POA: Diagnosis not present

## 2012-01-25 DIAGNOSIS — D509 Iron deficiency anemia, unspecified: Secondary | ICD-10-CM | POA: Diagnosis not present

## 2012-01-26 DIAGNOSIS — N186 End stage renal disease: Secondary | ICD-10-CM | POA: Diagnosis not present

## 2012-01-26 DIAGNOSIS — D509 Iron deficiency anemia, unspecified: Secondary | ICD-10-CM | POA: Diagnosis not present

## 2012-01-26 DIAGNOSIS — N2581 Secondary hyperparathyroidism of renal origin: Secondary | ICD-10-CM | POA: Diagnosis not present

## 2012-01-27 DIAGNOSIS — D509 Iron deficiency anemia, unspecified: Secondary | ICD-10-CM | POA: Diagnosis not present

## 2012-01-27 DIAGNOSIS — N186 End stage renal disease: Secondary | ICD-10-CM | POA: Diagnosis not present

## 2012-01-28 DIAGNOSIS — D509 Iron deficiency anemia, unspecified: Secondary | ICD-10-CM | POA: Diagnosis not present

## 2012-01-28 DIAGNOSIS — N186 End stage renal disease: Secondary | ICD-10-CM | POA: Diagnosis not present

## 2012-01-29 DIAGNOSIS — D509 Iron deficiency anemia, unspecified: Secondary | ICD-10-CM | POA: Diagnosis not present

## 2012-01-29 DIAGNOSIS — N186 End stage renal disease: Secondary | ICD-10-CM | POA: Diagnosis not present

## 2012-01-30 DIAGNOSIS — N186 End stage renal disease: Secondary | ICD-10-CM | POA: Diagnosis not present

## 2012-01-30 DIAGNOSIS — D509 Iron deficiency anemia, unspecified: Secondary | ICD-10-CM | POA: Diagnosis not present

## 2012-02-01 DIAGNOSIS — N186 End stage renal disease: Secondary | ICD-10-CM | POA: Diagnosis not present

## 2012-02-01 DIAGNOSIS — D509 Iron deficiency anemia, unspecified: Secondary | ICD-10-CM | POA: Diagnosis not present

## 2012-02-02 DIAGNOSIS — D509 Iron deficiency anemia, unspecified: Secondary | ICD-10-CM | POA: Diagnosis not present

## 2012-02-02 DIAGNOSIS — N186 End stage renal disease: Secondary | ICD-10-CM | POA: Diagnosis not present

## 2012-02-03 DIAGNOSIS — N186 End stage renal disease: Secondary | ICD-10-CM | POA: Diagnosis not present

## 2012-02-03 DIAGNOSIS — D509 Iron deficiency anemia, unspecified: Secondary | ICD-10-CM | POA: Diagnosis not present

## 2012-02-04 DIAGNOSIS — N186 End stage renal disease: Secondary | ICD-10-CM | POA: Diagnosis not present

## 2012-02-04 DIAGNOSIS — D509 Iron deficiency anemia, unspecified: Secondary | ICD-10-CM | POA: Diagnosis not present

## 2012-02-05 ENCOUNTER — Encounter: Payer: Self-pay | Admitting: Internal Medicine

## 2012-02-05 ENCOUNTER — Ambulatory Visit (INDEPENDENT_AMBULATORY_CARE_PROVIDER_SITE_OTHER): Payer: Medicare Other | Admitting: Internal Medicine

## 2012-02-05 VITALS — BP 118/68 | HR 85 | Temp 98.2°F | Ht 64.0 in | Wt 139.2 lb

## 2012-02-05 DIAGNOSIS — J40 Bronchitis, not specified as acute or chronic: Secondary | ICD-10-CM

## 2012-02-05 DIAGNOSIS — K589 Irritable bowel syndrome without diarrhea: Secondary | ICD-10-CM

## 2012-02-05 DIAGNOSIS — N186 End stage renal disease: Secondary | ICD-10-CM | POA: Diagnosis not present

## 2012-02-05 DIAGNOSIS — D509 Iron deficiency anemia, unspecified: Secondary | ICD-10-CM | POA: Diagnosis not present

## 2012-02-05 NOTE — Assessment & Plan Note (Signed)
Recent symptoms of bronchitis have completely resolved. We'll continue to monitor.

## 2012-02-05 NOTE — Assessment & Plan Note (Signed)
Symptoms most consistent with irritable bowel syndrome, however question if scar tissue and strictures may be contributing to symptoms. Will set up GI evaluation. Question if repeat colonoscopy might be helpful for further evaluation.

## 2012-02-05 NOTE — Progress Notes (Signed)
Subjective:    Patient ID: Sharon [name], female    DOB: [birth year], 79 y.o.   MRN: [id number]  HPI 79 year old female with history of chronic kidney disease stage V presents for followup after recent episode of bronchitis. She reports the cough has almost completely resolved. She denies any recent fever, chills, chest pain. She continues to have some diffuse abdominal cramping and pain. This is worsened after peritoneal dialysis. She has been discussing this with her dialysis nurse and describes it is draining pain. They're trying to limit the amount of fluid taken off of dialysis. She also continues to have cramping prior to bowel movements. She has some urgency of stool and occasional loose, nonbloody diarrhea. She has history of multiple abdominal surgeries and questions whether scar tissue may be contributing.  Outpatient Encounter Prescriptions as of 02/05/2012  Medication Sig Dispense Refill  . buPROPion (WELLBUTRIN SR) 150 MG 12 hr tablet Take 1 tablet (150 mg total) by mouth 2 (two) times daily.  60 tablet  3  . ergocalciferol (VITAMIN D2) 50000 UNITS capsule Take 50,000 Units by mouth once a week.      . metoprolol succinate (TOPROL-XL) 25 MG 24 hr tablet Take 1 tablet (25 mg total) by mouth daily.  30 tablet  6  . triamterene-hydrochlorothiazide (MAXZIDE) 75-50 MG per tablet Take 1 tablet by mouth daily.      . folic acid-vitamin b complex-vitamin c-selenium-zinc (DIALYVITE) 3 MG TABS Take 1 tablet by mouth daily.       BP 118/68  Pulse 85  Temp 98.2 F (36.8 C) (Oral)  Ht 5\' 4"  (1.626 m)  Wt 139 lb 4 oz (63.163 kg)  BMI 23.90 kg/m2  SpO2 97%  Review of Systems  Constitutional: Negative for fever, chills, appetite change, fatigue and unexpected weight change.  HENT: Negative for ear pain, congestion, sore throat, trouble swallowing, neck pain, voice change and sinus pressure.   Eyes: Negative for visual disturbance.  Respiratory: Positive for cough. Negative for shortness of  breath, wheezing and stridor.   Cardiovascular: Negative for chest pain, palpitations and leg swelling.  Gastrointestinal: Positive for abdominal pain and diarrhea. Negative for nausea, vomiting, constipation, blood in stool, abdominal distention and anal bleeding.  Genitourinary: Negative for dysuria and flank pain.  Musculoskeletal: Negative for myalgias, arthralgias and gait problem.  Skin: Negative for color change and rash.  Neurological: Negative for dizziness and headaches.  Hematological: Negative for adenopathy. Does not bruise/bleed easily.  Psychiatric/Behavioral: Negative for suicidal ideas, sleep disturbance and dysphoric mood. The patient is not nervous/anxious.        Objective:   Physical Exam  Constitutional: She is oriented to person, place, and time. She appears well-developed and well-nourished. No distress.  HENT:  Head: Normocephalic and atraumatic.  Right Ear: External ear normal.  Left Ear: External ear normal.  Nose: Nose normal.  Mouth/Throat: Oropharynx is clear and moist. No oropharyngeal exudate.  Eyes: Conjunctivae normal are normal. Pupils are equal, round, and reactive to light. Right eye exhibits no discharge. Left eye exhibits no discharge. No scleral icterus.  Neck: Normal range of motion. Neck supple. No tracheal deviation present. No thyromegaly present.  Cardiovascular: Normal rate, regular rhythm, normal heart sounds and intact distal pulses.  Exam reveals no gallop and no friction rub.   No murmur heard. Pulmonary/Chest: Effort normal and breath sounds normal. No respiratory distress. She has no wheezes. She has no rales. She exhibits no tenderness.  Abdominal: Soft. Bowel sounds are normal. She exhibits no  distension and no mass. There is tenderness. There is no rebound and no guarding.  Musculoskeletal: Normal range of motion. She exhibits no edema and no tenderness.  Lymphadenopathy:    She has no cervical adenopathy.  Neurological: She is  alert and oriented to person, place, and time. No cranial nerve deficit. She exhibits normal muscle tone. Coordination normal.  Skin: Skin is warm and dry. No rash noted. She is not diaphoretic. No erythema. No pallor.  Psychiatric: She has a normal mood and affect. Her behavior is normal. Judgment and thought content normal.          Assessment & Plan:

## 2012-02-06 DIAGNOSIS — N186 End stage renal disease: Secondary | ICD-10-CM | POA: Diagnosis not present

## 2012-02-06 DIAGNOSIS — D509 Iron deficiency anemia, unspecified: Secondary | ICD-10-CM | POA: Diagnosis not present

## 2012-02-08 DIAGNOSIS — D509 Iron deficiency anemia, unspecified: Secondary | ICD-10-CM | POA: Diagnosis not present

## 2012-02-08 DIAGNOSIS — N186 End stage renal disease: Secondary | ICD-10-CM | POA: Diagnosis not present

## 2012-02-09 DIAGNOSIS — N186 End stage renal disease: Secondary | ICD-10-CM | POA: Diagnosis not present

## 2012-02-09 DIAGNOSIS — D509 Iron deficiency anemia, unspecified: Secondary | ICD-10-CM | POA: Diagnosis not present

## 2012-02-10 DIAGNOSIS — N186 End stage renal disease: Secondary | ICD-10-CM | POA: Diagnosis not present

## 2012-02-10 DIAGNOSIS — D509 Iron deficiency anemia, unspecified: Secondary | ICD-10-CM | POA: Diagnosis not present

## 2012-02-11 DIAGNOSIS — N186 End stage renal disease: Secondary | ICD-10-CM | POA: Diagnosis not present

## 2012-02-11 DIAGNOSIS — D509 Iron deficiency anemia, unspecified: Secondary | ICD-10-CM | POA: Diagnosis not present

## 2012-02-12 DIAGNOSIS — D509 Iron deficiency anemia, unspecified: Secondary | ICD-10-CM | POA: Diagnosis not present

## 2012-02-12 DIAGNOSIS — N186 End stage renal disease: Secondary | ICD-10-CM | POA: Diagnosis not present

## 2012-02-13 DIAGNOSIS — N186 End stage renal disease: Secondary | ICD-10-CM | POA: Diagnosis not present

## 2012-02-13 DIAGNOSIS — D509 Iron deficiency anemia, unspecified: Secondary | ICD-10-CM | POA: Diagnosis not present

## 2012-02-15 DIAGNOSIS — D509 Iron deficiency anemia, unspecified: Secondary | ICD-10-CM | POA: Diagnosis not present

## 2012-02-15 DIAGNOSIS — N186 End stage renal disease: Secondary | ICD-10-CM | POA: Diagnosis not present

## 2012-02-16 DIAGNOSIS — N186 End stage renal disease: Secondary | ICD-10-CM | POA: Diagnosis not present

## 2012-02-16 DIAGNOSIS — D509 Iron deficiency anemia, unspecified: Secondary | ICD-10-CM | POA: Diagnosis not present

## 2012-02-17 DIAGNOSIS — N186 End stage renal disease: Secondary | ICD-10-CM | POA: Diagnosis not present

## 2012-02-17 DIAGNOSIS — D509 Iron deficiency anemia, unspecified: Secondary | ICD-10-CM | POA: Diagnosis not present

## 2012-02-18 DIAGNOSIS — N186 End stage renal disease: Secondary | ICD-10-CM | POA: Diagnosis not present

## 2012-02-18 DIAGNOSIS — D509 Iron deficiency anemia, unspecified: Secondary | ICD-10-CM | POA: Diagnosis not present

## 2012-02-19 DIAGNOSIS — D509 Iron deficiency anemia, unspecified: Secondary | ICD-10-CM | POA: Diagnosis not present

## 2012-02-19 DIAGNOSIS — N186 End stage renal disease: Secondary | ICD-10-CM | POA: Diagnosis not present

## 2012-02-20 DIAGNOSIS — N186 End stage renal disease: Secondary | ICD-10-CM | POA: Diagnosis not present

## 2012-02-20 DIAGNOSIS — D509 Iron deficiency anemia, unspecified: Secondary | ICD-10-CM | POA: Diagnosis not present

## 2012-02-22 DIAGNOSIS — N186 End stage renal disease: Secondary | ICD-10-CM | POA: Diagnosis not present

## 2012-02-22 DIAGNOSIS — D509 Iron deficiency anemia, unspecified: Secondary | ICD-10-CM | POA: Diagnosis not present

## 2012-02-23 DIAGNOSIS — N186 End stage renal disease: Secondary | ICD-10-CM | POA: Diagnosis not present

## 2012-02-24 DIAGNOSIS — N186 End stage renal disease: Secondary | ICD-10-CM | POA: Diagnosis not present

## 2012-02-25 DIAGNOSIS — N186 End stage renal disease: Secondary | ICD-10-CM | POA: Diagnosis not present

## 2012-02-26 DIAGNOSIS — N186 End stage renal disease: Secondary | ICD-10-CM | POA: Diagnosis not present

## 2012-02-27 DIAGNOSIS — D509 Iron deficiency anemia, unspecified: Secondary | ICD-10-CM | POA: Diagnosis not present

## 2012-02-27 DIAGNOSIS — N186 End stage renal disease: Secondary | ICD-10-CM | POA: Diagnosis not present

## 2012-02-28 DIAGNOSIS — D509 Iron deficiency anemia, unspecified: Secondary | ICD-10-CM | POA: Diagnosis not present

## 2012-02-28 DIAGNOSIS — N186 End stage renal disease: Secondary | ICD-10-CM | POA: Diagnosis not present

## 2012-02-29 DIAGNOSIS — N186 End stage renal disease: Secondary | ICD-10-CM | POA: Diagnosis not present

## 2012-02-29 DIAGNOSIS — D509 Iron deficiency anemia, unspecified: Secondary | ICD-10-CM | POA: Diagnosis not present

## 2012-03-01 DIAGNOSIS — N186 End stage renal disease: Secondary | ICD-10-CM | POA: Diagnosis not present

## 2012-03-01 DIAGNOSIS — D509 Iron deficiency anemia, unspecified: Secondary | ICD-10-CM | POA: Diagnosis not present

## 2012-03-02 DIAGNOSIS — N186 End stage renal disease: Secondary | ICD-10-CM | POA: Diagnosis not present

## 2012-03-02 DIAGNOSIS — D509 Iron deficiency anemia, unspecified: Secondary | ICD-10-CM | POA: Diagnosis not present

## 2012-03-03 DIAGNOSIS — N186 End stage renal disease: Secondary | ICD-10-CM | POA: Diagnosis not present

## 2012-03-03 DIAGNOSIS — D509 Iron deficiency anemia, unspecified: Secondary | ICD-10-CM | POA: Diagnosis not present

## 2012-03-04 DIAGNOSIS — D509 Iron deficiency anemia, unspecified: Secondary | ICD-10-CM | POA: Diagnosis not present

## 2012-03-04 DIAGNOSIS — N186 End stage renal disease: Secondary | ICD-10-CM | POA: Diagnosis not present

## 2012-03-05 DIAGNOSIS — D509 Iron deficiency anemia, unspecified: Secondary | ICD-10-CM | POA: Diagnosis not present

## 2012-03-05 DIAGNOSIS — N186 End stage renal disease: Secondary | ICD-10-CM | POA: Diagnosis not present

## 2012-03-06 DIAGNOSIS — N186 End stage renal disease: Secondary | ICD-10-CM | POA: Diagnosis not present

## 2012-03-06 DIAGNOSIS — D509 Iron deficiency anemia, unspecified: Secondary | ICD-10-CM | POA: Diagnosis not present

## 2012-03-07 DIAGNOSIS — D509 Iron deficiency anemia, unspecified: Secondary | ICD-10-CM | POA: Diagnosis not present

## 2012-03-07 DIAGNOSIS — N186 End stage renal disease: Secondary | ICD-10-CM | POA: Diagnosis not present

## 2012-03-08 DIAGNOSIS — D509 Iron deficiency anemia, unspecified: Secondary | ICD-10-CM | POA: Diagnosis not present

## 2012-03-08 DIAGNOSIS — N186 End stage renal disease: Secondary | ICD-10-CM | POA: Diagnosis not present

## 2012-03-09 DIAGNOSIS — D509 Iron deficiency anemia, unspecified: Secondary | ICD-10-CM | POA: Diagnosis not present

## 2012-03-09 DIAGNOSIS — N186 End stage renal disease: Secondary | ICD-10-CM | POA: Diagnosis not present

## 2012-03-10 DIAGNOSIS — D509 Iron deficiency anemia, unspecified: Secondary | ICD-10-CM | POA: Diagnosis not present

## 2012-03-10 DIAGNOSIS — N186 End stage renal disease: Secondary | ICD-10-CM | POA: Diagnosis not present

## 2012-03-11 DIAGNOSIS — D509 Iron deficiency anemia, unspecified: Secondary | ICD-10-CM | POA: Diagnosis not present

## 2012-03-11 DIAGNOSIS — N186 End stage renal disease: Secondary | ICD-10-CM | POA: Diagnosis not present

## 2012-03-12 DIAGNOSIS — N186 End stage renal disease: Secondary | ICD-10-CM | POA: Diagnosis not present

## 2012-03-12 DIAGNOSIS — D509 Iron deficiency anemia, unspecified: Secondary | ICD-10-CM | POA: Diagnosis not present

## 2012-03-13 DIAGNOSIS — N186 End stage renal disease: Secondary | ICD-10-CM | POA: Diagnosis not present

## 2012-03-13 DIAGNOSIS — D509 Iron deficiency anemia, unspecified: Secondary | ICD-10-CM | POA: Diagnosis not present

## 2012-03-14 DIAGNOSIS — D509 Iron deficiency anemia, unspecified: Secondary | ICD-10-CM | POA: Diagnosis not present

## 2012-03-14 DIAGNOSIS — N186 End stage renal disease: Secondary | ICD-10-CM | POA: Diagnosis not present

## 2012-03-15 DIAGNOSIS — D509 Iron deficiency anemia, unspecified: Secondary | ICD-10-CM | POA: Diagnosis not present

## 2012-03-15 DIAGNOSIS — N186 End stage renal disease: Secondary | ICD-10-CM | POA: Diagnosis not present

## 2012-03-16 ENCOUNTER — Other Ambulatory Visit: Payer: Self-pay | Admitting: *Deleted

## 2012-03-16 DIAGNOSIS — N186 End stage renal disease: Secondary | ICD-10-CM | POA: Diagnosis not present

## 2012-03-16 DIAGNOSIS — F329 Major depressive disorder, single episode, unspecified: Secondary | ICD-10-CM

## 2012-03-16 DIAGNOSIS — D509 Iron deficiency anemia, unspecified: Secondary | ICD-10-CM | POA: Diagnosis not present

## 2012-03-16 MED ORDER — BUPROPION HCL ER (SR) 150 MG PO TB12: 150.0000 mg | ORAL_TABLET | Freq: Two times a day (BID) | ORAL | Status: DC

## 2012-03-17 DIAGNOSIS — N186 End stage renal disease: Secondary | ICD-10-CM | POA: Diagnosis not present

## 2012-03-17 DIAGNOSIS — D509 Iron deficiency anemia, unspecified: Secondary | ICD-10-CM | POA: Diagnosis not present

## 2012-03-18 DIAGNOSIS — N186 End stage renal disease: Secondary | ICD-10-CM | POA: Diagnosis not present

## 2012-03-18 DIAGNOSIS — D509 Iron deficiency anemia, unspecified: Secondary | ICD-10-CM | POA: Diagnosis not present

## 2012-03-19 DIAGNOSIS — N186 End stage renal disease: Secondary | ICD-10-CM | POA: Diagnosis not present

## 2012-03-19 DIAGNOSIS — D509 Iron deficiency anemia, unspecified: Secondary | ICD-10-CM | POA: Diagnosis not present

## 2012-03-20 DIAGNOSIS — D509 Iron deficiency anemia, unspecified: Secondary | ICD-10-CM | POA: Diagnosis not present

## 2012-03-20 DIAGNOSIS — N186 End stage renal disease: Secondary | ICD-10-CM | POA: Diagnosis not present

## 2012-03-21 DIAGNOSIS — N186 End stage renal disease: Secondary | ICD-10-CM | POA: Diagnosis not present

## 2012-03-21 DIAGNOSIS — D509 Iron deficiency anemia, unspecified: Secondary | ICD-10-CM | POA: Diagnosis not present

## 2012-03-22 DIAGNOSIS — D509 Iron deficiency anemia, unspecified: Secondary | ICD-10-CM | POA: Diagnosis not present

## 2012-03-22 DIAGNOSIS — N186 End stage renal disease: Secondary | ICD-10-CM | POA: Diagnosis not present

## 2012-03-23 DIAGNOSIS — N186 End stage renal disease: Secondary | ICD-10-CM | POA: Diagnosis not present

## 2012-03-23 DIAGNOSIS — D509 Iron deficiency anemia, unspecified: Secondary | ICD-10-CM | POA: Diagnosis not present

## 2012-03-24 DIAGNOSIS — D509 Iron deficiency anemia, unspecified: Secondary | ICD-10-CM | POA: Diagnosis not present

## 2012-03-24 DIAGNOSIS — N186 End stage renal disease: Secondary | ICD-10-CM | POA: Diagnosis not present

## 2012-03-25 DIAGNOSIS — N186 End stage renal disease: Secondary | ICD-10-CM | POA: Diagnosis not present

## 2012-03-25 DIAGNOSIS — D509 Iron deficiency anemia, unspecified: Secondary | ICD-10-CM | POA: Diagnosis not present

## 2012-03-26 DIAGNOSIS — N2581 Secondary hyperparathyroidism of renal origin: Secondary | ICD-10-CM | POA: Diagnosis not present

## 2012-03-26 DIAGNOSIS — D509 Iron deficiency anemia, unspecified: Secondary | ICD-10-CM | POA: Diagnosis not present

## 2012-03-26 DIAGNOSIS — N186 End stage renal disease: Secondary | ICD-10-CM | POA: Diagnosis not present

## 2012-03-27 DIAGNOSIS — N2581 Secondary hyperparathyroidism of renal origin: Secondary | ICD-10-CM | POA: Diagnosis not present

## 2012-03-27 DIAGNOSIS — D509 Iron deficiency anemia, unspecified: Secondary | ICD-10-CM | POA: Diagnosis not present

## 2012-03-27 DIAGNOSIS — N186 End stage renal disease: Secondary | ICD-10-CM | POA: Diagnosis not present

## 2012-03-28 DIAGNOSIS — N186 End stage renal disease: Secondary | ICD-10-CM | POA: Diagnosis not present

## 2012-03-28 DIAGNOSIS — D509 Iron deficiency anemia, unspecified: Secondary | ICD-10-CM | POA: Diagnosis not present

## 2012-03-28 DIAGNOSIS — N2581 Secondary hyperparathyroidism of renal origin: Secondary | ICD-10-CM | POA: Diagnosis not present

## 2012-03-29 ENCOUNTER — Encounter: Payer: Self-pay | Admitting: Internal Medicine

## 2012-03-29 DIAGNOSIS — N2581 Secondary hyperparathyroidism of renal origin: Secondary | ICD-10-CM | POA: Diagnosis not present

## 2012-03-29 DIAGNOSIS — N186 End stage renal disease: Secondary | ICD-10-CM | POA: Diagnosis not present

## 2012-03-29 DIAGNOSIS — D509 Iron deficiency anemia, unspecified: Secondary | ICD-10-CM | POA: Diagnosis not present

## 2012-03-30 DIAGNOSIS — N186 End stage renal disease: Secondary | ICD-10-CM | POA: Diagnosis not present

## 2012-03-30 DIAGNOSIS — N2581 Secondary hyperparathyroidism of renal origin: Secondary | ICD-10-CM | POA: Diagnosis not present

## 2012-03-30 DIAGNOSIS — D509 Iron deficiency anemia, unspecified: Secondary | ICD-10-CM | POA: Diagnosis not present

## 2012-03-31 DIAGNOSIS — N186 End stage renal disease: Secondary | ICD-10-CM | POA: Diagnosis not present

## 2012-03-31 DIAGNOSIS — N2581 Secondary hyperparathyroidism of renal origin: Secondary | ICD-10-CM | POA: Diagnosis not present

## 2012-03-31 DIAGNOSIS — D509 Iron deficiency anemia, unspecified: Secondary | ICD-10-CM | POA: Diagnosis not present

## 2012-04-01 DIAGNOSIS — D509 Iron deficiency anemia, unspecified: Secondary | ICD-10-CM | POA: Diagnosis not present

## 2012-04-01 DIAGNOSIS — N2581 Secondary hyperparathyroidism of renal origin: Secondary | ICD-10-CM | POA: Diagnosis not present

## 2012-04-01 DIAGNOSIS — N186 End stage renal disease: Secondary | ICD-10-CM | POA: Diagnosis not present

## 2012-04-02 DIAGNOSIS — N186 End stage renal disease: Secondary | ICD-10-CM | POA: Diagnosis not present

## 2012-04-02 DIAGNOSIS — D509 Iron deficiency anemia, unspecified: Secondary | ICD-10-CM | POA: Diagnosis not present

## 2012-04-02 DIAGNOSIS — N2581 Secondary hyperparathyroidism of renal origin: Secondary | ICD-10-CM | POA: Diagnosis not present

## 2012-04-03 DIAGNOSIS — D509 Iron deficiency anemia, unspecified: Secondary | ICD-10-CM | POA: Diagnosis not present

## 2012-04-03 DIAGNOSIS — N186 End stage renal disease: Secondary | ICD-10-CM | POA: Diagnosis not present

## 2012-04-03 DIAGNOSIS — N2581 Secondary hyperparathyroidism of renal origin: Secondary | ICD-10-CM | POA: Diagnosis not present

## 2012-04-04 DIAGNOSIS — N186 End stage renal disease: Secondary | ICD-10-CM | POA: Diagnosis not present

## 2012-04-04 DIAGNOSIS — D509 Iron deficiency anemia, unspecified: Secondary | ICD-10-CM | POA: Diagnosis not present

## 2012-04-04 DIAGNOSIS — N2581 Secondary hyperparathyroidism of renal origin: Secondary | ICD-10-CM | POA: Diagnosis not present

## 2012-04-05 DIAGNOSIS — D509 Iron deficiency anemia, unspecified: Secondary | ICD-10-CM | POA: Diagnosis not present

## 2012-04-05 DIAGNOSIS — N186 End stage renal disease: Secondary | ICD-10-CM | POA: Diagnosis not present

## 2012-04-05 DIAGNOSIS — N2581 Secondary hyperparathyroidism of renal origin: Secondary | ICD-10-CM | POA: Diagnosis not present

## 2012-04-06 DIAGNOSIS — N2581 Secondary hyperparathyroidism of renal origin: Secondary | ICD-10-CM | POA: Diagnosis not present

## 2012-04-06 DIAGNOSIS — N186 End stage renal disease: Secondary | ICD-10-CM | POA: Diagnosis not present

## 2012-04-06 DIAGNOSIS — D509 Iron deficiency anemia, unspecified: Secondary | ICD-10-CM | POA: Diagnosis not present

## 2012-04-07 DIAGNOSIS — N186 End stage renal disease: Secondary | ICD-10-CM | POA: Diagnosis not present

## 2012-04-07 DIAGNOSIS — N2581 Secondary hyperparathyroidism of renal origin: Secondary | ICD-10-CM | POA: Diagnosis not present

## 2012-04-07 DIAGNOSIS — D509 Iron deficiency anemia, unspecified: Secondary | ICD-10-CM | POA: Diagnosis not present

## 2012-04-08 DIAGNOSIS — N2581 Secondary hyperparathyroidism of renal origin: Secondary | ICD-10-CM | POA: Diagnosis not present

## 2012-04-08 DIAGNOSIS — D509 Iron deficiency anemia, unspecified: Secondary | ICD-10-CM | POA: Diagnosis not present

## 2012-04-08 DIAGNOSIS — N186 End stage renal disease: Secondary | ICD-10-CM | POA: Diagnosis not present

## 2012-04-09 DIAGNOSIS — N186 End stage renal disease: Secondary | ICD-10-CM | POA: Diagnosis not present

## 2012-04-09 DIAGNOSIS — D509 Iron deficiency anemia, unspecified: Secondary | ICD-10-CM | POA: Diagnosis not present

## 2012-04-10 DIAGNOSIS — N186 End stage renal disease: Secondary | ICD-10-CM | POA: Diagnosis not present

## 2012-04-10 DIAGNOSIS — N2581 Secondary hyperparathyroidism of renal origin: Secondary | ICD-10-CM | POA: Diagnosis not present

## 2012-04-10 DIAGNOSIS — D509 Iron deficiency anemia, unspecified: Secondary | ICD-10-CM | POA: Diagnosis not present

## 2012-04-11 DIAGNOSIS — D509 Iron deficiency anemia, unspecified: Secondary | ICD-10-CM | POA: Diagnosis not present

## 2012-04-11 DIAGNOSIS — N2581 Secondary hyperparathyroidism of renal origin: Secondary | ICD-10-CM | POA: Diagnosis not present

## 2012-04-11 DIAGNOSIS — N186 End stage renal disease: Secondary | ICD-10-CM | POA: Diagnosis not present

## 2012-04-12 DIAGNOSIS — R197 Diarrhea, unspecified: Secondary | ICD-10-CM | POA: Diagnosis not present

## 2012-04-12 DIAGNOSIS — D509 Iron deficiency anemia, unspecified: Secondary | ICD-10-CM | POA: Diagnosis not present

## 2012-04-12 DIAGNOSIS — R1031 Right lower quadrant pain: Secondary | ICD-10-CM | POA: Diagnosis not present

## 2012-04-12 DIAGNOSIS — K589 Irritable bowel syndrome without diarrhea: Secondary | ICD-10-CM | POA: Diagnosis not present

## 2012-04-12 DIAGNOSIS — N2581 Secondary hyperparathyroidism of renal origin: Secondary | ICD-10-CM | POA: Diagnosis not present

## 2012-04-12 DIAGNOSIS — R1032 Left lower quadrant pain: Secondary | ICD-10-CM | POA: Diagnosis not present

## 2012-04-12 DIAGNOSIS — N186 End stage renal disease: Secondary | ICD-10-CM | POA: Diagnosis not present

## 2012-04-13 DIAGNOSIS — N2581 Secondary hyperparathyroidism of renal origin: Secondary | ICD-10-CM | POA: Diagnosis not present

## 2012-04-13 DIAGNOSIS — D509 Iron deficiency anemia, unspecified: Secondary | ICD-10-CM | POA: Diagnosis not present

## 2012-04-13 DIAGNOSIS — N186 End stage renal disease: Secondary | ICD-10-CM | POA: Diagnosis not present

## 2012-04-14 DIAGNOSIS — N2581 Secondary hyperparathyroidism of renal origin: Secondary | ICD-10-CM | POA: Diagnosis not present

## 2012-04-14 DIAGNOSIS — N186 End stage renal disease: Secondary | ICD-10-CM | POA: Diagnosis not present

## 2012-04-14 DIAGNOSIS — D509 Iron deficiency anemia, unspecified: Secondary | ICD-10-CM | POA: Diagnosis not present

## 2012-04-15 DIAGNOSIS — D509 Iron deficiency anemia, unspecified: Secondary | ICD-10-CM | POA: Diagnosis not present

## 2012-04-15 DIAGNOSIS — N2581 Secondary hyperparathyroidism of renal origin: Secondary | ICD-10-CM | POA: Diagnosis not present

## 2012-04-15 DIAGNOSIS — N186 End stage renal disease: Secondary | ICD-10-CM | POA: Diagnosis not present

## 2012-04-16 DIAGNOSIS — D509 Iron deficiency anemia, unspecified: Secondary | ICD-10-CM | POA: Diagnosis not present

## 2012-04-16 DIAGNOSIS — N2581 Secondary hyperparathyroidism of renal origin: Secondary | ICD-10-CM | POA: Diagnosis not present

## 2012-04-16 DIAGNOSIS — N186 End stage renal disease: Secondary | ICD-10-CM | POA: Diagnosis not present

## 2012-04-17 DIAGNOSIS — D509 Iron deficiency anemia, unspecified: Secondary | ICD-10-CM | POA: Diagnosis not present

## 2012-04-17 DIAGNOSIS — N186 End stage renal disease: Secondary | ICD-10-CM | POA: Diagnosis not present

## 2012-04-17 DIAGNOSIS — N2581 Secondary hyperparathyroidism of renal origin: Secondary | ICD-10-CM | POA: Diagnosis not present

## 2012-04-18 DIAGNOSIS — N2581 Secondary hyperparathyroidism of renal origin: Secondary | ICD-10-CM | POA: Diagnosis not present

## 2012-04-18 DIAGNOSIS — N186 End stage renal disease: Secondary | ICD-10-CM | POA: Diagnosis not present

## 2012-04-18 DIAGNOSIS — D509 Iron deficiency anemia, unspecified: Secondary | ICD-10-CM | POA: Diagnosis not present

## 2012-04-19 DIAGNOSIS — N2581 Secondary hyperparathyroidism of renal origin: Secondary | ICD-10-CM | POA: Diagnosis not present

## 2012-04-19 DIAGNOSIS — D509 Iron deficiency anemia, unspecified: Secondary | ICD-10-CM | POA: Diagnosis not present

## 2012-04-19 DIAGNOSIS — N186 End stage renal disease: Secondary | ICD-10-CM | POA: Diagnosis not present

## 2012-04-20 DIAGNOSIS — D509 Iron deficiency anemia, unspecified: Secondary | ICD-10-CM | POA: Diagnosis not present

## 2012-04-20 DIAGNOSIS — N186 End stage renal disease: Secondary | ICD-10-CM | POA: Diagnosis not present

## 2012-04-20 DIAGNOSIS — N2581 Secondary hyperparathyroidism of renal origin: Secondary | ICD-10-CM | POA: Diagnosis not present

## 2012-04-21 ENCOUNTER — Other Ambulatory Visit: Payer: Self-pay | Admitting: *Deleted

## 2012-04-21 DIAGNOSIS — N2581 Secondary hyperparathyroidism of renal origin: Secondary | ICD-10-CM | POA: Diagnosis not present

## 2012-04-21 DIAGNOSIS — N186 End stage renal disease: Secondary | ICD-10-CM | POA: Diagnosis not present

## 2012-04-21 DIAGNOSIS — F329 Major depressive disorder, single episode, unspecified: Secondary | ICD-10-CM

## 2012-04-21 DIAGNOSIS — D509 Iron deficiency anemia, unspecified: Secondary | ICD-10-CM | POA: Diagnosis not present

## 2012-04-21 MED ORDER — BUPROPION HCL ER (SR) 150 MG PO TB12: 150.0000 mg | ORAL_TABLET | Freq: Two times a day (BID) | ORAL | Status: DC

## 2012-04-21 NOTE — Telephone Encounter (Signed)
Eprescribed.

## 2012-04-22 DIAGNOSIS — D509 Iron deficiency anemia, unspecified: Secondary | ICD-10-CM | POA: Diagnosis not present

## 2012-04-22 DIAGNOSIS — N186 End stage renal disease: Secondary | ICD-10-CM | POA: Diagnosis not present

## 2012-04-22 DIAGNOSIS — N2581 Secondary hyperparathyroidism of renal origin: Secondary | ICD-10-CM | POA: Diagnosis not present

## 2012-04-23 DIAGNOSIS — N186 End stage renal disease: Secondary | ICD-10-CM | POA: Diagnosis not present

## 2012-04-23 DIAGNOSIS — D509 Iron deficiency anemia, unspecified: Secondary | ICD-10-CM | POA: Diagnosis not present

## 2012-04-23 DIAGNOSIS — N2581 Secondary hyperparathyroidism of renal origin: Secondary | ICD-10-CM | POA: Diagnosis not present

## 2012-04-24 DIAGNOSIS — N2581 Secondary hyperparathyroidism of renal origin: Secondary | ICD-10-CM | POA: Diagnosis not present

## 2012-04-24 DIAGNOSIS — N186 End stage renal disease: Secondary | ICD-10-CM | POA: Diagnosis not present

## 2012-04-24 DIAGNOSIS — D509 Iron deficiency anemia, unspecified: Secondary | ICD-10-CM | POA: Diagnosis not present

## 2012-04-25 DIAGNOSIS — N2581 Secondary hyperparathyroidism of renal origin: Secondary | ICD-10-CM | POA: Diagnosis not present

## 2012-04-25 DIAGNOSIS — N186 End stage renal disease: Secondary | ICD-10-CM | POA: Diagnosis not present

## 2012-04-25 DIAGNOSIS — D509 Iron deficiency anemia, unspecified: Secondary | ICD-10-CM | POA: Diagnosis not present

## 2012-04-26 DIAGNOSIS — N186 End stage renal disease: Secondary | ICD-10-CM | POA: Diagnosis not present

## 2012-04-26 DIAGNOSIS — D509 Iron deficiency anemia, unspecified: Secondary | ICD-10-CM | POA: Diagnosis not present

## 2012-04-26 DIAGNOSIS — N2581 Secondary hyperparathyroidism of renal origin: Secondary | ICD-10-CM | POA: Diagnosis not present

## 2012-04-27 DIAGNOSIS — D509 Iron deficiency anemia, unspecified: Secondary | ICD-10-CM | POA: Diagnosis not present

## 2012-04-27 DIAGNOSIS — N2581 Secondary hyperparathyroidism of renal origin: Secondary | ICD-10-CM | POA: Diagnosis not present

## 2012-04-27 DIAGNOSIS — N186 End stage renal disease: Secondary | ICD-10-CM | POA: Diagnosis not present

## 2012-04-28 DIAGNOSIS — N186 End stage renal disease: Secondary | ICD-10-CM | POA: Diagnosis not present

## 2012-04-28 DIAGNOSIS — N2581 Secondary hyperparathyroidism of renal origin: Secondary | ICD-10-CM | POA: Diagnosis not present

## 2012-04-28 DIAGNOSIS — D509 Iron deficiency anemia, unspecified: Secondary | ICD-10-CM | POA: Diagnosis not present

## 2012-04-29 DIAGNOSIS — N2581 Secondary hyperparathyroidism of renal origin: Secondary | ICD-10-CM | POA: Diagnosis not present

## 2012-04-29 DIAGNOSIS — N186 End stage renal disease: Secondary | ICD-10-CM | POA: Diagnosis not present

## 2012-04-29 DIAGNOSIS — D509 Iron deficiency anemia, unspecified: Secondary | ICD-10-CM | POA: Diagnosis not present

## 2012-04-30 DIAGNOSIS — D509 Iron deficiency anemia, unspecified: Secondary | ICD-10-CM | POA: Diagnosis not present

## 2012-04-30 DIAGNOSIS — N2581 Secondary hyperparathyroidism of renal origin: Secondary | ICD-10-CM | POA: Diagnosis not present

## 2012-04-30 DIAGNOSIS — N186 End stage renal disease: Secondary | ICD-10-CM | POA: Diagnosis not present

## 2012-05-01 DIAGNOSIS — D509 Iron deficiency anemia, unspecified: Secondary | ICD-10-CM | POA: Diagnosis not present

## 2012-05-01 DIAGNOSIS — N2581 Secondary hyperparathyroidism of renal origin: Secondary | ICD-10-CM | POA: Diagnosis not present

## 2012-05-01 DIAGNOSIS — N186 End stage renal disease: Secondary | ICD-10-CM | POA: Diagnosis not present

## 2012-05-02 ENCOUNTER — Ambulatory Visit (INDEPENDENT_AMBULATORY_CARE_PROVIDER_SITE_OTHER): Payer: Medicare Other | Admitting: Internal Medicine

## 2012-05-02 ENCOUNTER — Encounter: Payer: Self-pay | Admitting: Internal Medicine

## 2012-05-02 VITALS — BP 130/78 | HR 65 | Temp 98.2°F | Wt 141.0 lb

## 2012-05-02 DIAGNOSIS — K589 Irritable bowel syndrome without diarrhea: Secondary | ICD-10-CM

## 2012-05-02 DIAGNOSIS — D509 Iron deficiency anemia, unspecified: Secondary | ICD-10-CM | POA: Diagnosis not present

## 2012-05-02 DIAGNOSIS — N185 Chronic kidney disease, stage 5: Secondary | ICD-10-CM

## 2012-05-02 DIAGNOSIS — I10 Essential (primary) hypertension: Secondary | ICD-10-CM

## 2012-05-02 DIAGNOSIS — M542 Cervicalgia: Secondary | ICD-10-CM

## 2012-05-02 DIAGNOSIS — N2581 Secondary hyperparathyroidism of renal origin: Secondary | ICD-10-CM | POA: Diagnosis not present

## 2012-05-02 DIAGNOSIS — Z1239 Encounter for other screening for malignant neoplasm of breast: Secondary | ICD-10-CM

## 2012-05-02 DIAGNOSIS — N186 End stage renal disease: Secondary | ICD-10-CM | POA: Diagnosis not present

## 2012-05-02 MED ORDER — TRIAMTERENE-HCTZ 75-50 MG PO TABS: 1.0000 | ORAL_TABLET | Freq: Every day | ORAL | Status: DC

## 2012-05-02 NOTE — Progress Notes (Signed)
Subjective:    Patient ID: Sharon [name], female    DOB: 1926-05-26, 79 y.o.   MRN: [id number]  HPI 79 year old female with history of chronic kidney disease on peritoneal dialysis, hypertension, irritable bowel syndrome, and chronic back pain presents for followup. She reports she is generally feeling well. She continues on peritoneal dialysis at home daily. She reports that recent labs have been stable. She denies any abdominal pain, chest pain, shortness of breath. In regards to irritable bowel syndrome, she reports improvement with use of Bentyl. She denies any recent persistent abdominal pain, nausea, vomiting, diarrhea. In regards to chronic upper back pain, she reports no improvement with use of Tylenol. Pain in her neck and upper back occasionally keeps her from sleeping. It is described as aching across the upper back that generally does not radiate. It has been present for years following an injury in the distant past. She was previously told that she may need surgical intervention to stabilize bulging discs, but opted not to pursue intervention. She denies arm weakness.   Outpatient Encounter Prescriptions as of 05/02/2012  Medication Sig Dispense Refill  . buPROPion (WELLBUTRIN SR) 150 MG 12 hr tablet Take 1 tablet (150 mg total) by mouth 2 (two) times daily.  60 tablet  0  . calcitRIOL (ROCALTROL) 0.25 MCG capsule Take 0.25 mcg by mouth every other day.      . dicyclomine (BENTYL) 20 MG tablet Take 20 mg by mouth 2 (two) times daily as needed (two times a day as needed for IBS).       . folic acid-vitamin b complex-vitamin c-selenium-zinc (DIALYVITE) 3 MG TABS Take 1 tablet by mouth daily.      Marland Kitchen gentamicin ointment (GARAMYCIN) 0.1 %       . metoprolol succinate (TOPROL-XL) 25 MG 24 hr tablet Take 1 tablet (25 mg total) by mouth daily.  30 tablet  6  . triamterene-hydrochlorothiazide (MAXZIDE) 75-50 MG per tablet Take 1 tablet by mouth daily.  90 tablet  4  . [DISCONTINUED]  triamterene-hydrochlorothiazide (MAXZIDE) 75-50 MG per tablet Take 1 tablet by mouth daily.      . ergocalciferol (VITAMIN D2) 50000 UNITS capsule Take 50,000 Units by mouth once a week.       No facility-administered encounter medications on file as of 05/02/2012.   BP 130/78  Pulse 65  Temp(Src) 98.2 F (36.8 C) (Oral)  Wt 141 lb (63.957 kg)  BMI 24.19 kg/m2  SpO2 96%  Review of Systems  Constitutional: Negative for fever, chills, appetite change, fatigue and unexpected weight change.  HENT: Positive for neck pain and neck stiffness. Negative for ear pain, congestion, sore throat, trouble swallowing, voice change and sinus pressure.   Eyes: Negative for visual disturbance.  Respiratory: Negative for cough, shortness of breath, wheezing and stridor.   Cardiovascular: Negative for chest pain, palpitations and leg swelling.  Gastrointestinal: Negative for nausea, vomiting, abdominal pain, diarrhea, constipation, blood in stool, abdominal distention and anal bleeding.  Genitourinary: Negative for dysuria and flank pain.  Musculoskeletal: Positive for myalgias. Negative for arthralgias and gait problem.  Skin: Negative for color change and rash.  Neurological: Negative for dizziness and headaches.  Hematological: Negative for adenopathy. Does not bruise/bleed easily.  Psychiatric/Behavioral: Negative for suicidal ideas, sleep disturbance and dysphoric mood. The patient is not nervous/anxious.        Objective:   Physical Exam  Constitutional: She is oriented to person, place, and time. She appears well-developed and well-nourished. No distress.  HENT:  Head: Normocephalic and atraumatic.  Right Ear: External ear normal.  Left Ear: External ear normal.  Nose: Nose normal.  Mouth/Throat: Oropharynx is clear and moist. No oropharyngeal exudate.  Eyes: Conjunctivae are normal. Pupils are equal, round, and reactive to light. Right eye exhibits no discharge. Left eye exhibits no discharge.  No scleral icterus.  Neck: Normal range of motion. Neck supple. No tracheal deviation present. No thyromegaly present.  Cardiovascular: Normal rate, regular rhythm, normal heart sounds and intact distal pulses.  Exam reveals no gallop and no friction rub.   No murmur heard. Pulmonary/Chest: Effort normal and breath sounds normal. Not tachypneic. No respiratory distress. She has no decreased breath sounds. She has no wheezes. She has no rhonchi. She has no rales. She exhibits no tenderness.  Musculoskeletal: She exhibits no edema and no tenderness.       Cervical back: She exhibits decreased range of motion and pain. She exhibits no tenderness, no bony tenderness and no edema.  Lymphadenopathy:    She has no cervical adenopathy.  Neurological: She is alert and oriented to person, place, and time. No cranial nerve deficit. She exhibits normal muscle tone. Coordination normal.  Skin: Skin is warm and dry. No rash noted. She is not diaphoretic. No erythema. No pallor.  Psychiatric: She has a normal mood and affect. Her behavior is normal. Judgment and thought content normal.          Assessment & Plan:

## 2012-05-02 NOTE — Assessment & Plan Note (Signed)
BP Readings from Last 3 Encounters:  05/02/12 130/78  02/05/12 118/68  01/22/12 120/72   BP well controlled on Triamterine-HCTZ and metoprolol. Continue current meds. Will request recent labs from dialysis.

## 2012-05-02 NOTE — Assessment & Plan Note (Signed)
Continue peritoneal dialysis. Will request recent notes and labs from nephrology.

## 2012-05-02 NOTE — Assessment & Plan Note (Signed)
Chronic, secondary to previous injury and chronic osteoarthritis. Discussed potentially repeating plain xray and setting up evaluation with orthopedics, however pt declines. We also discussed using stronger pain medications, however will hold off given risks of sedating medications.

## 2012-05-02 NOTE — Assessment & Plan Note (Signed)
Symptoms well controlled with intermittent use of Bentyl. Will continue.

## 2012-05-03 DIAGNOSIS — N2581 Secondary hyperparathyroidism of renal origin: Secondary | ICD-10-CM | POA: Diagnosis not present

## 2012-05-03 DIAGNOSIS — D509 Iron deficiency anemia, unspecified: Secondary | ICD-10-CM | POA: Diagnosis not present

## 2012-05-03 DIAGNOSIS — N186 End stage renal disease: Secondary | ICD-10-CM | POA: Diagnosis not present

## 2012-05-04 DIAGNOSIS — N186 End stage renal disease: Secondary | ICD-10-CM | POA: Diagnosis not present

## 2012-05-04 DIAGNOSIS — D509 Iron deficiency anemia, unspecified: Secondary | ICD-10-CM | POA: Diagnosis not present

## 2012-05-04 DIAGNOSIS — N2581 Secondary hyperparathyroidism of renal origin: Secondary | ICD-10-CM | POA: Diagnosis not present

## 2012-05-05 DIAGNOSIS — D509 Iron deficiency anemia, unspecified: Secondary | ICD-10-CM | POA: Diagnosis not present

## 2012-05-05 DIAGNOSIS — N2581 Secondary hyperparathyroidism of renal origin: Secondary | ICD-10-CM | POA: Diagnosis not present

## 2012-05-05 DIAGNOSIS — N186 End stage renal disease: Secondary | ICD-10-CM | POA: Diagnosis not present

## 2012-05-06 DIAGNOSIS — D509 Iron deficiency anemia, unspecified: Secondary | ICD-10-CM | POA: Diagnosis not present

## 2012-05-06 DIAGNOSIS — N186 End stage renal disease: Secondary | ICD-10-CM | POA: Diagnosis not present

## 2012-05-06 DIAGNOSIS — N2581 Secondary hyperparathyroidism of renal origin: Secondary | ICD-10-CM | POA: Diagnosis not present

## 2012-05-07 DIAGNOSIS — D509 Iron deficiency anemia, unspecified: Secondary | ICD-10-CM | POA: Diagnosis not present

## 2012-05-07 DIAGNOSIS — N186 End stage renal disease: Secondary | ICD-10-CM | POA: Diagnosis not present

## 2012-05-07 DIAGNOSIS — N2581 Secondary hyperparathyroidism of renal origin: Secondary | ICD-10-CM | POA: Diagnosis not present

## 2012-05-08 DIAGNOSIS — N2581 Secondary hyperparathyroidism of renal origin: Secondary | ICD-10-CM | POA: Diagnosis not present

## 2012-05-08 DIAGNOSIS — N186 End stage renal disease: Secondary | ICD-10-CM | POA: Diagnosis not present

## 2012-05-08 DIAGNOSIS — D509 Iron deficiency anemia, unspecified: Secondary | ICD-10-CM | POA: Diagnosis not present

## 2012-05-09 DIAGNOSIS — N2581 Secondary hyperparathyroidism of renal origin: Secondary | ICD-10-CM | POA: Diagnosis not present

## 2012-05-09 DIAGNOSIS — D509 Iron deficiency anemia, unspecified: Secondary | ICD-10-CM | POA: Diagnosis not present

## 2012-05-09 DIAGNOSIS — N186 End stage renal disease: Secondary | ICD-10-CM | POA: Diagnosis not present

## 2012-05-10 DIAGNOSIS — N2581 Secondary hyperparathyroidism of renal origin: Secondary | ICD-10-CM | POA: Diagnosis not present

## 2012-05-10 DIAGNOSIS — N186 End stage renal disease: Secondary | ICD-10-CM | POA: Diagnosis not present

## 2012-05-10 DIAGNOSIS — K589 Irritable bowel syndrome without diarrhea: Secondary | ICD-10-CM | POA: Diagnosis not present

## 2012-05-10 DIAGNOSIS — R198 Other specified symptoms and signs involving the digestive system and abdomen: Secondary | ICD-10-CM | POA: Diagnosis not present

## 2012-05-10 DIAGNOSIS — R1084 Generalized abdominal pain: Secondary | ICD-10-CM | POA: Diagnosis not present

## 2012-05-10 DIAGNOSIS — D509 Iron deficiency anemia, unspecified: Secondary | ICD-10-CM | POA: Diagnosis not present

## 2012-05-11 ENCOUNTER — Ambulatory Visit: Payer: Self-pay | Admitting: Unknown Physician Specialty

## 2012-05-11 DIAGNOSIS — K5730 Diverticulosis of large intestine without perforation or abscess without bleeding: Secondary | ICD-10-CM | POA: Diagnosis not present

## 2012-05-11 DIAGNOSIS — N289 Disorder of kidney and ureter, unspecified: Secondary | ICD-10-CM | POA: Diagnosis not present

## 2012-05-11 DIAGNOSIS — N2581 Secondary hyperparathyroidism of renal origin: Secondary | ICD-10-CM | POA: Diagnosis not present

## 2012-05-11 DIAGNOSIS — N186 End stage renal disease: Secondary | ICD-10-CM | POA: Diagnosis not present

## 2012-05-11 DIAGNOSIS — K769 Liver disease, unspecified: Secondary | ICD-10-CM | POA: Diagnosis not present

## 2012-05-11 DIAGNOSIS — D509 Iron deficiency anemia, unspecified: Secondary | ICD-10-CM | POA: Diagnosis not present

## 2012-05-12 DIAGNOSIS — N2581 Secondary hyperparathyroidism of renal origin: Secondary | ICD-10-CM | POA: Diagnosis not present

## 2012-05-12 DIAGNOSIS — D509 Iron deficiency anemia, unspecified: Secondary | ICD-10-CM | POA: Diagnosis not present

## 2012-05-12 DIAGNOSIS — N186 End stage renal disease: Secondary | ICD-10-CM | POA: Diagnosis not present

## 2012-05-13 DIAGNOSIS — N186 End stage renal disease: Secondary | ICD-10-CM | POA: Diagnosis not present

## 2012-05-13 DIAGNOSIS — N2581 Secondary hyperparathyroidism of renal origin: Secondary | ICD-10-CM | POA: Diagnosis not present

## 2012-05-13 DIAGNOSIS — D509 Iron deficiency anemia, unspecified: Secondary | ICD-10-CM | POA: Diagnosis not present

## 2012-05-14 DIAGNOSIS — N2581 Secondary hyperparathyroidism of renal origin: Secondary | ICD-10-CM | POA: Diagnosis not present

## 2012-05-14 DIAGNOSIS — D509 Iron deficiency anemia, unspecified: Secondary | ICD-10-CM | POA: Diagnosis not present

## 2012-05-14 DIAGNOSIS — N186 End stage renal disease: Secondary | ICD-10-CM | POA: Diagnosis not present

## 2012-05-15 DIAGNOSIS — N186 End stage renal disease: Secondary | ICD-10-CM | POA: Diagnosis not present

## 2012-05-15 DIAGNOSIS — D509 Iron deficiency anemia, unspecified: Secondary | ICD-10-CM | POA: Diagnosis not present

## 2012-05-15 DIAGNOSIS — N2581 Secondary hyperparathyroidism of renal origin: Secondary | ICD-10-CM | POA: Diagnosis not present

## 2012-05-16 DIAGNOSIS — D509 Iron deficiency anemia, unspecified: Secondary | ICD-10-CM | POA: Diagnosis not present

## 2012-05-16 DIAGNOSIS — N2581 Secondary hyperparathyroidism of renal origin: Secondary | ICD-10-CM | POA: Diagnosis not present

## 2012-05-16 DIAGNOSIS — N186 End stage renal disease: Secondary | ICD-10-CM | POA: Diagnosis not present

## 2012-05-17 DIAGNOSIS — N186 End stage renal disease: Secondary | ICD-10-CM | POA: Diagnosis not present

## 2012-05-17 DIAGNOSIS — D509 Iron deficiency anemia, unspecified: Secondary | ICD-10-CM | POA: Diagnosis not present

## 2012-05-17 DIAGNOSIS — N2581 Secondary hyperparathyroidism of renal origin: Secondary | ICD-10-CM | POA: Diagnosis not present

## 2012-05-18 DIAGNOSIS — N186 End stage renal disease: Secondary | ICD-10-CM | POA: Diagnosis not present

## 2012-05-18 DIAGNOSIS — N2581 Secondary hyperparathyroidism of renal origin: Secondary | ICD-10-CM | POA: Diagnosis not present

## 2012-05-18 DIAGNOSIS — D509 Iron deficiency anemia, unspecified: Secondary | ICD-10-CM | POA: Diagnosis not present

## 2012-05-19 DIAGNOSIS — N2581 Secondary hyperparathyroidism of renal origin: Secondary | ICD-10-CM | POA: Diagnosis not present

## 2012-05-19 DIAGNOSIS — N186 End stage renal disease: Secondary | ICD-10-CM | POA: Diagnosis not present

## 2012-05-19 DIAGNOSIS — D509 Iron deficiency anemia, unspecified: Secondary | ICD-10-CM | POA: Diagnosis not present

## 2012-05-20 DIAGNOSIS — N2581 Secondary hyperparathyroidism of renal origin: Secondary | ICD-10-CM | POA: Diagnosis not present

## 2012-05-20 DIAGNOSIS — N186 End stage renal disease: Secondary | ICD-10-CM | POA: Diagnosis not present

## 2012-05-20 DIAGNOSIS — D509 Iron deficiency anemia, unspecified: Secondary | ICD-10-CM | POA: Diagnosis not present

## 2012-05-21 DIAGNOSIS — N186 End stage renal disease: Secondary | ICD-10-CM | POA: Diagnosis not present

## 2012-05-21 DIAGNOSIS — N2581 Secondary hyperparathyroidism of renal origin: Secondary | ICD-10-CM | POA: Diagnosis not present

## 2012-05-21 DIAGNOSIS — D509 Iron deficiency anemia, unspecified: Secondary | ICD-10-CM | POA: Diagnosis not present

## 2012-05-22 DIAGNOSIS — N186 End stage renal disease: Secondary | ICD-10-CM | POA: Diagnosis not present

## 2012-05-22 DIAGNOSIS — D509 Iron deficiency anemia, unspecified: Secondary | ICD-10-CM | POA: Diagnosis not present

## 2012-05-22 DIAGNOSIS — N2581 Secondary hyperparathyroidism of renal origin: Secondary | ICD-10-CM | POA: Diagnosis not present

## 2012-05-23 DIAGNOSIS — N186 End stage renal disease: Secondary | ICD-10-CM | POA: Diagnosis not present

## 2012-05-23 DIAGNOSIS — N2581 Secondary hyperparathyroidism of renal origin: Secondary | ICD-10-CM | POA: Diagnosis not present

## 2012-05-23 DIAGNOSIS — D509 Iron deficiency anemia, unspecified: Secondary | ICD-10-CM | POA: Diagnosis not present

## 2012-05-24 ENCOUNTER — Other Ambulatory Visit: Payer: Self-pay | Admitting: *Deleted

## 2012-05-24 DIAGNOSIS — F329 Major depressive disorder, single episode, unspecified: Secondary | ICD-10-CM

## 2012-05-24 DIAGNOSIS — D509 Iron deficiency anemia, unspecified: Secondary | ICD-10-CM | POA: Diagnosis not present

## 2012-05-24 DIAGNOSIS — I4891 Unspecified atrial fibrillation: Secondary | ICD-10-CM | POA: Diagnosis not present

## 2012-05-24 DIAGNOSIS — N186 End stage renal disease: Secondary | ICD-10-CM | POA: Diagnosis not present

## 2012-05-24 DIAGNOSIS — R933 Abnormal findings on diagnostic imaging of other parts of digestive tract: Secondary | ICD-10-CM | POA: Diagnosis not present

## 2012-05-24 DIAGNOSIS — N2581 Secondary hyperparathyroidism of renal origin: Secondary | ICD-10-CM | POA: Diagnosis not present

## 2012-05-24 MED ORDER — BUPROPION HCL ER (SR) 150 MG PO TB12: 150.0000 mg | ORAL_TABLET | Freq: Two times a day (BID) | ORAL | Status: DC

## 2012-05-24 NOTE — Telephone Encounter (Signed)
Eprescribed.

## 2012-05-25 DIAGNOSIS — N186 End stage renal disease: Secondary | ICD-10-CM | POA: Diagnosis not present

## 2012-05-25 DIAGNOSIS — D509 Iron deficiency anemia, unspecified: Secondary | ICD-10-CM | POA: Diagnosis not present

## 2012-05-25 DIAGNOSIS — N2581 Secondary hyperparathyroidism of renal origin: Secondary | ICD-10-CM | POA: Diagnosis not present

## 2012-05-26 DIAGNOSIS — N186 End stage renal disease: Secondary | ICD-10-CM | POA: Diagnosis not present

## 2012-05-26 DIAGNOSIS — D509 Iron deficiency anemia, unspecified: Secondary | ICD-10-CM | POA: Diagnosis not present

## 2012-05-30 ENCOUNTER — Ambulatory Visit: Payer: Self-pay | Admitting: Internal Medicine

## 2012-05-30 DIAGNOSIS — N6459 Other signs and symptoms in breast: Secondary | ICD-10-CM | POA: Diagnosis not present

## 2012-05-30 DIAGNOSIS — Z1231 Encounter for screening mammogram for malignant neoplasm of breast: Secondary | ICD-10-CM | POA: Diagnosis not present

## 2012-05-31 ENCOUNTER — Telehealth: Payer: Self-pay | Admitting: Internal Medicine

## 2012-05-31 NOTE — Telephone Encounter (Signed)
Patient returned call, she stated she has not heard anything yet.

## 2012-05-31 NOTE — Telephone Encounter (Signed)
No answer or voicemail to leave message 

## 2012-05-31 NOTE — Telephone Encounter (Signed)
Mammogram [id number] noted asymmetry in right breast. Requested additional imaging including compression views. Has this been scheduled?

## 2012-06-01 ENCOUNTER — Ambulatory Visit: Payer: Self-pay | Admitting: Internal Medicine

## 2012-06-01 DIAGNOSIS — R928 Other abnormal and inconclusive findings on diagnostic imaging of breast: Secondary | ICD-10-CM | POA: Diagnosis not present

## 2012-06-01 DIAGNOSIS — I209 Angina pectoris, unspecified: Secondary | ICD-10-CM | POA: Diagnosis not present

## 2012-06-01 DIAGNOSIS — I130 Hypertensive heart and chronic kidney disease with heart failure and stage 1 through stage 4 chronic kidney disease, or unspecified chronic kidney disease: Secondary | ICD-10-CM | POA: Diagnosis not present

## 2012-06-01 DIAGNOSIS — N039 Chronic nephritic syndrome with unspecified morphologic changes: Secondary | ICD-10-CM | POA: Diagnosis not present

## 2012-06-01 DIAGNOSIS — I059 Rheumatic mitral valve disease, unspecified: Secondary | ICD-10-CM | POA: Diagnosis not present

## 2012-06-01 DIAGNOSIS — I5022 Chronic systolic (congestive) heart failure: Secondary | ICD-10-CM | POA: Diagnosis not present

## 2012-06-01 DIAGNOSIS — N6459 Other signs and symptoms in breast: Secondary | ICD-10-CM | POA: Diagnosis not present

## 2012-06-01 LAB — HM MAMMOGRAPHY

## 2012-06-01 NOTE — Telephone Encounter (Signed)
We will need to contact [facility] to try to set up additional views.

## 2012-06-08 NOTE — Telephone Encounter (Signed)
Sharon [name] @ [facility] is working on additional views. These have to be scheduled with the radiologist who read her original films.

## 2012-06-10 DIAGNOSIS — R970 Elevated carcinoembryonic antigen [CEA]: Secondary | ICD-10-CM | POA: Diagnosis not present

## 2012-06-10 DIAGNOSIS — K589 Irritable bowel syndrome without diarrhea: Secondary | ICD-10-CM | POA: Diagnosis not present

## 2012-06-10 DIAGNOSIS — R109 Unspecified abdominal pain: Secondary | ICD-10-CM | POA: Diagnosis not present

## 2012-06-10 DIAGNOSIS — R933 Abnormal findings on diagnostic imaging of other parts of digestive tract: Secondary | ICD-10-CM | POA: Diagnosis not present

## 2012-06-21 ENCOUNTER — Other Ambulatory Visit: Payer: Self-pay | Admitting: *Deleted

## 2012-06-21 DIAGNOSIS — F329 Major depressive disorder, single episode, unspecified: Secondary | ICD-10-CM

## 2012-06-22 ENCOUNTER — Encounter: Payer: Self-pay | Admitting: Internal Medicine

## 2012-06-22 MED ORDER — BUPROPION HCL ER (SR) 150 MG PO TB12: 150.0000 mg | ORAL_TABLET | Freq: Two times a day (BID) | ORAL | Status: DC

## 2012-06-26 DIAGNOSIS — N186 End stage renal disease: Secondary | ICD-10-CM | POA: Diagnosis not present

## 2012-06-27 DIAGNOSIS — D509 Iron deficiency anemia, unspecified: Secondary | ICD-10-CM | POA: Diagnosis not present

## 2012-06-27 DIAGNOSIS — N186 End stage renal disease: Secondary | ICD-10-CM | POA: Diagnosis not present

## 2012-07-01 ENCOUNTER — Encounter: Payer: Self-pay | Admitting: Internal Medicine

## 2012-07-05 DIAGNOSIS — R9430 Abnormal result of cardiovascular function study, unspecified: Secondary | ICD-10-CM | POA: Diagnosis not present

## 2012-07-05 DIAGNOSIS — I2510 Atherosclerotic heart disease of native coronary artery without angina pectoris: Secondary | ICD-10-CM | POA: Diagnosis not present

## 2012-07-05 DIAGNOSIS — I252 Old myocardial infarction: Secondary | ICD-10-CM | POA: Diagnosis not present

## 2012-07-05 DIAGNOSIS — I059 Rheumatic mitral valve disease, unspecified: Secondary | ICD-10-CM | POA: Diagnosis not present

## 2012-07-07 DIAGNOSIS — Z0189 Encounter for other specified special examinations: Secondary | ICD-10-CM | POA: Diagnosis not present

## 2012-07-07 DIAGNOSIS — C44319 Basal cell carcinoma of skin of other parts of face: Secondary | ICD-10-CM | POA: Diagnosis not present

## 2012-07-07 DIAGNOSIS — L819 Disorder of pigmentation, unspecified: Secondary | ICD-10-CM | POA: Diagnosis not present

## 2012-07-07 DIAGNOSIS — Z85828 Personal history of other malignant neoplasm of skin: Secondary | ICD-10-CM | POA: Diagnosis not present

## 2012-07-07 DIAGNOSIS — L570 Actinic keratosis: Secondary | ICD-10-CM | POA: Diagnosis not present

## 2012-07-07 DIAGNOSIS — D692 Other nonthrombocytopenic purpura: Secondary | ICD-10-CM | POA: Diagnosis not present

## 2012-07-26 ENCOUNTER — Other Ambulatory Visit: Payer: Self-pay | Admitting: *Deleted

## 2012-07-26 DIAGNOSIS — I10 Essential (primary) hypertension: Secondary | ICD-10-CM

## 2012-07-26 DIAGNOSIS — N186 End stage renal disease: Secondary | ICD-10-CM | POA: Diagnosis not present

## 2012-07-26 DIAGNOSIS — N2581 Secondary hyperparathyroidism of renal origin: Secondary | ICD-10-CM | POA: Diagnosis not present

## 2012-07-26 DIAGNOSIS — D509 Iron deficiency anemia, unspecified: Secondary | ICD-10-CM | POA: Diagnosis not present

## 2012-07-26 DIAGNOSIS — F329 Major depressive disorder, single episode, unspecified: Secondary | ICD-10-CM

## 2012-07-27 DIAGNOSIS — D509 Iron deficiency anemia, unspecified: Secondary | ICD-10-CM | POA: Diagnosis not present

## 2012-07-27 DIAGNOSIS — N2581 Secondary hyperparathyroidism of renal origin: Secondary | ICD-10-CM | POA: Diagnosis not present

## 2012-07-27 DIAGNOSIS — N186 End stage renal disease: Secondary | ICD-10-CM | POA: Diagnosis not present

## 2012-07-27 MED ORDER — BUPROPION HCL ER (SR) 150 MG PO TB12: 150.0000 mg | ORAL_TABLET | Freq: Two times a day (BID) | ORAL | Status: DC

## 2012-07-27 MED ORDER — METOPROLOL SUCCINATE ER 25 MG PO TB24: 25.0000 mg | ORAL_TABLET | Freq: Every day | ORAL | Status: DC

## 2012-07-28 DIAGNOSIS — D509 Iron deficiency anemia, unspecified: Secondary | ICD-10-CM | POA: Diagnosis not present

## 2012-07-28 DIAGNOSIS — N186 End stage renal disease: Secondary | ICD-10-CM | POA: Diagnosis not present

## 2012-07-28 DIAGNOSIS — N2581 Secondary hyperparathyroidism of renal origin: Secondary | ICD-10-CM | POA: Diagnosis not present

## 2012-07-29 DIAGNOSIS — N186 End stage renal disease: Secondary | ICD-10-CM | POA: Diagnosis not present

## 2012-07-29 DIAGNOSIS — D509 Iron deficiency anemia, unspecified: Secondary | ICD-10-CM | POA: Diagnosis not present

## 2012-07-29 DIAGNOSIS — N2581 Secondary hyperparathyroidism of renal origin: Secondary | ICD-10-CM | POA: Diagnosis not present

## 2012-07-30 DIAGNOSIS — D509 Iron deficiency anemia, unspecified: Secondary | ICD-10-CM | POA: Diagnosis not present

## 2012-07-30 DIAGNOSIS — N2581 Secondary hyperparathyroidism of renal origin: Secondary | ICD-10-CM | POA: Diagnosis not present

## 2012-07-30 DIAGNOSIS — N186 End stage renal disease: Secondary | ICD-10-CM | POA: Diagnosis not present

## 2012-08-01 ENCOUNTER — Ambulatory Visit (INDEPENDENT_AMBULATORY_CARE_PROVIDER_SITE_OTHER)
Admission: RE | Admit: 2012-08-01 | Discharge: 2012-08-01 | Disposition: A | Discharge status: Home / Self Care | Payer: Medicare Other | Source: Ambulatory Visit | Attending: Internal Medicine | Admitting: Internal Medicine

## 2012-08-01 ENCOUNTER — Encounter: Payer: Self-pay | Admitting: Internal Medicine

## 2012-08-01 ENCOUNTER — Ambulatory Visit (INDEPENDENT_AMBULATORY_CARE_PROVIDER_SITE_OTHER): Payer: Medicare Other | Admitting: Internal Medicine

## 2012-08-01 VITALS — BP 134/74 | HR 74 | Temp 98.2°F | Wt 141.0 lb

## 2012-08-01 DIAGNOSIS — M542 Cervicalgia: Secondary | ICD-10-CM

## 2012-08-01 DIAGNOSIS — N959 Unspecified menopausal and perimenopausal disorder: Secondary | ICD-10-CM

## 2012-08-01 DIAGNOSIS — N2581 Secondary hyperparathyroidism of renal origin: Secondary | ICD-10-CM | POA: Diagnosis not present

## 2012-08-01 DIAGNOSIS — N186 End stage renal disease: Secondary | ICD-10-CM | POA: Diagnosis not present

## 2012-08-01 DIAGNOSIS — M545 Low back pain, unspecified: Secondary | ICD-10-CM | POA: Insufficient documentation

## 2012-08-01 DIAGNOSIS — M47812 Spondylosis without myelopathy or radiculopathy, cervical region: Secondary | ICD-10-CM | POA: Diagnosis not present

## 2012-08-01 DIAGNOSIS — N185 Chronic kidney disease, stage 5: Secondary | ICD-10-CM | POA: Diagnosis not present

## 2012-08-01 DIAGNOSIS — I10 Essential (primary) hypertension: Secondary | ICD-10-CM | POA: Diagnosis not present

## 2012-08-01 DIAGNOSIS — K589 Irritable bowel syndrome without diarrhea: Secondary | ICD-10-CM

## 2012-08-01 DIAGNOSIS — I495 Sick sinus syndrome: Secondary | ICD-10-CM

## 2012-08-01 DIAGNOSIS — D509 Iron deficiency anemia, unspecified: Secondary | ICD-10-CM | POA: Diagnosis not present

## 2012-08-01 MED ORDER — ESTROGENS CONJUGATED 0.3 MG PO TABS: ORAL_TABLET | ORAL | Status: DC

## 2012-08-01 MED ORDER — HYDROCODONE-ACETAMINOPHEN 5-325 MG PO TABS: 1.0000 | ORAL_TABLET | Freq: Three times a day (TID) | ORAL | Status: DC | PRN

## 2012-08-01 NOTE — Assessment & Plan Note (Signed)
Stable. On peritoneal dialysis. Will continue to follow with nephrology.

## 2012-08-01 NOTE — Assessment & Plan Note (Addendum)
Chronic pain in the cervical spine. X-ray today shows cervical spondylosis but no acute process. Will continue Tylenol or hydrocodone as needed for severe pain. If symptoms are persistent, discussed referral to orthopedics.

## 2012-08-01 NOTE — Assessment & Plan Note (Signed)
Likely secondary to muscular strain. Patient is unable to use anti-inflammatory medications because of chronic kidney disease. Will continue Tylenol and use hydrocodone as needed. If pain is persisting, consider referral to pain clinic.

## 2012-08-01 NOTE — Progress Notes (Signed)
Subjective:    Patient ID: Sharon [name], female    DOB: [birth year], 79 y.o.   MRN: [id number]  HPI 79 year old female with history of chronic kidney disease on peritoneal dialysis, hypertension, irritable bowel syndrome, chronic cervical spine pain presents for followup. In regards to peritoneal dialysis, she reports things are going well. No recent issues with her catheter. Electrolytes have been stable.  In regards to irritable bowel syndrome, she reports recent colonoscopy was normal. She also underwent CT of the abdomen which was normal except for liver lesion which was incidentally found and will be followed with MRI. She reports that symptoms of diarrhea are persistent that cramping is very well-controlled with use of Bentyl.  She reports that pain in her neck has been persistent. It seems to be getting worse. It is limiting her ability to sleep. It is made worse by laying flat. It is described as aching it does not radiate. She occasionally takes Tylenol for this. She also has some pain in her lower spine. This too is described as aching which does not radiate. She denies any recent trauma to her upper or lower back. She does have a history of a distant soft tissue injury to her cervical spine.  Outpatient Encounter Prescriptions as of 08/01/2012  Medication Sig Dispense Refill  . buPROPion (WELLBUTRIN SR) 150 MG 12 hr tablet Take 1 tablet (150 mg total) by mouth 2 (two) times daily.  60 tablet  1  . calcitRIOL (ROCALTROL) 0.25 MCG capsule Take 0.25 mcg by mouth every other day.      . dicyclomine (BENTYL) 20 MG tablet Take 20 mg by mouth 2 (two) times daily as needed (two times a day as needed for IBS).       Marland Kitchen ergocalciferol (VITAMIN D2) 50000 UNITS capsule Take 50,000 Units by mouth once a week.      . folic acid-vitamin b complex-vitamin c-selenium-zinc (DIALYVITE) 3 MG TABS Take 1 tablet by mouth daily.      Marland Kitchen gentamicin ointment (GARAMYCIN) 0.1 %       . metoprolol succinate  (TOPROL-XL) 25 MG 24 hr tablet Take 1 tablet (25 mg total) by mouth daily.  30 tablet  6  . triamterene-hydrochlorothiazide (MAXZIDE) 75-50 MG per tablet Take 1 tablet by mouth daily.  90 tablet  4  . estrogens, conjugated, (PREMARIN) 0.3 MG tablet Take 1 pill daily  30 tablet  11  . HYDROcodone-acetaminophen (NORCO/VICODIN) 5-325 MG per tablet Take 1 tablet by mouth every 8 (eight) hours as needed for pain.  90 tablet  3   No facility-administered encounter medications on file as of 08/01/2012.   BP 134/74  Pulse 74  Temp(Src) 98.2 F (36.8 C) (Oral)  Wt 141 lb (63.957 kg)  BMI 24.19 kg/m2  SpO2 97%  Review of Systems  Constitutional: Negative for fever, chills, appetite change, fatigue and unexpected weight change.  HENT: Negative for ear pain, congestion, sore throat, trouble swallowing, neck pain, voice change and sinus pressure.   Eyes: Negative for visual disturbance.  Respiratory: Negative for cough, shortness of breath, wheezing and stridor.   Cardiovascular: Negative for chest pain, palpitations and leg swelling.  Gastrointestinal: Positive for diarrhea. Negative for nausea, vomiting, abdominal pain, constipation, blood in stool, abdominal distention and anal bleeding.  Genitourinary: Negative for dysuria and flank pain.  Musculoskeletal: Positive for myalgias, back pain and arthralgias. Negative for gait problem.  Skin: Negative for color change and rash.  Neurological: Negative for dizziness and headaches.  Hematological: Negative  for adenopathy. Does not bruise/bleed easily.  Psychiatric/Behavioral: Negative for suicidal ideas, sleep disturbance and dysphoric mood. The patient is not nervous/anxious.        Objective:   Physical Exam  Constitutional: She is oriented to person, place, and time. She appears well-developed and well-nourished. No distress.  HENT:  Head: Normocephalic and atraumatic.  Right Ear: External ear normal.  Left Ear: External ear normal.  Nose:  Nose normal.  Mouth/Throat: Oropharynx is clear and moist. No oropharyngeal exudate.  Eyes: Conjunctivae are normal. Pupils are equal, round, and reactive to light. Right eye exhibits no discharge. Left eye exhibits no discharge. No scleral icterus.  Neck: Normal range of motion. Neck supple. No tracheal deviation present. No thyromegaly present.  Cardiovascular: Normal rate, regular rhythm, normal heart sounds and intact distal pulses.  Exam reveals no gallop and no friction rub.   No murmur heard. Pulmonary/Chest: Effort normal and breath sounds normal. No accessory muscle usage. Not tachypneic. No respiratory distress. She has no decreased breath sounds. She has no wheezes. She has no rhonchi. She has no rales. She exhibits no tenderness.  Abdominal: Soft. Bowel sounds are normal. She exhibits no distension and no mass. There is no tenderness. There is no rebound and no guarding.  Musculoskeletal: Normal range of motion. She exhibits no edema and no tenderness.       Cervical back: She exhibits pain. She exhibits normal range of motion, no tenderness, no bony tenderness and no swelling.       Thoracic back: She exhibits pain. She exhibits normal range of motion and no tenderness.  Lymphadenopathy:    She has no cervical adenopathy.  Neurological: She is alert and oriented to person, place, and time. No cranial nerve deficit. She exhibits normal muscle tone. Coordination normal.  Skin: Skin is warm and dry. No rash noted. She is not diaphoretic. No erythema. No pallor.  Psychiatric: She has a normal mood and affect. Her behavior is normal. Judgment and thought content normal.          Assessment & Plan:

## 2012-08-01 NOTE — Assessment & Plan Note (Signed)
BP Readings from Last 3 Encounters:  08/01/12 134/74  05/02/12 130/78  02/05/12 118/68   Blood pressure well-controlled on current medications. Will continue.

## 2012-08-01 NOTE — Assessment & Plan Note (Signed)
Symptoms relatively well controlled with Bentyl. Will continue.

## 2012-08-01 NOTE — Assessment & Plan Note (Signed)
Pacemaker in place. Followed by cardiology. We'll request records on recent evaluation.

## 2012-08-01 NOTE — Assessment & Plan Note (Signed)
Symptoms of sweats, increased anxiety, and difficulty sleeping made much worse by stopping Premarin. Patient feels side effects are intolerable. Will restart Premarin.

## 2012-08-02 DIAGNOSIS — N186 End stage renal disease: Secondary | ICD-10-CM | POA: Diagnosis not present

## 2012-08-02 DIAGNOSIS — D509 Iron deficiency anemia, unspecified: Secondary | ICD-10-CM | POA: Diagnosis not present

## 2012-08-02 DIAGNOSIS — N2581 Secondary hyperparathyroidism of renal origin: Secondary | ICD-10-CM | POA: Diagnosis not present

## 2012-08-04 DIAGNOSIS — N2581 Secondary hyperparathyroidism of renal origin: Secondary | ICD-10-CM | POA: Diagnosis not present

## 2012-08-04 DIAGNOSIS — N186 End stage renal disease: Secondary | ICD-10-CM | POA: Diagnosis not present

## 2012-08-04 DIAGNOSIS — D509 Iron deficiency anemia, unspecified: Secondary | ICD-10-CM | POA: Diagnosis not present

## 2012-08-05 DIAGNOSIS — N2581 Secondary hyperparathyroidism of renal origin: Secondary | ICD-10-CM | POA: Diagnosis not present

## 2012-08-05 DIAGNOSIS — N186 End stage renal disease: Secondary | ICD-10-CM | POA: Diagnosis not present

## 2012-08-05 DIAGNOSIS — D509 Iron deficiency anemia, unspecified: Secondary | ICD-10-CM | POA: Diagnosis not present

## 2012-08-06 DIAGNOSIS — D509 Iron deficiency anemia, unspecified: Secondary | ICD-10-CM | POA: Diagnosis not present

## 2012-08-06 DIAGNOSIS — N186 End stage renal disease: Secondary | ICD-10-CM | POA: Diagnosis not present

## 2012-08-06 DIAGNOSIS — N2581 Secondary hyperparathyroidism of renal origin: Secondary | ICD-10-CM | POA: Diagnosis not present

## 2012-08-08 DIAGNOSIS — N186 End stage renal disease: Secondary | ICD-10-CM | POA: Diagnosis not present

## 2012-08-08 DIAGNOSIS — D509 Iron deficiency anemia, unspecified: Secondary | ICD-10-CM | POA: Diagnosis not present

## 2012-08-08 DIAGNOSIS — N2581 Secondary hyperparathyroidism of renal origin: Secondary | ICD-10-CM | POA: Diagnosis not present

## 2012-08-09 DIAGNOSIS — D509 Iron deficiency anemia, unspecified: Secondary | ICD-10-CM | POA: Diagnosis not present

## 2012-08-09 DIAGNOSIS — N2581 Secondary hyperparathyroidism of renal origin: Secondary | ICD-10-CM | POA: Diagnosis not present

## 2012-08-09 DIAGNOSIS — N186 End stage renal disease: Secondary | ICD-10-CM | POA: Diagnosis not present

## 2012-08-10 ENCOUNTER — Encounter: Payer: Self-pay | Admitting: Unknown Physician Specialty

## 2012-08-11 DIAGNOSIS — N186 End stage renal disease: Secondary | ICD-10-CM | POA: Diagnosis not present

## 2012-08-11 DIAGNOSIS — D509 Iron deficiency anemia, unspecified: Secondary | ICD-10-CM | POA: Diagnosis not present

## 2012-08-11 DIAGNOSIS — N2581 Secondary hyperparathyroidism of renal origin: Secondary | ICD-10-CM | POA: Diagnosis not present

## 2012-08-12 DIAGNOSIS — N2581 Secondary hyperparathyroidism of renal origin: Secondary | ICD-10-CM | POA: Diagnosis not present

## 2012-08-12 DIAGNOSIS — N186 End stage renal disease: Secondary | ICD-10-CM | POA: Diagnosis not present

## 2012-08-12 DIAGNOSIS — D509 Iron deficiency anemia, unspecified: Secondary | ICD-10-CM | POA: Diagnosis not present

## 2012-08-13 DIAGNOSIS — N2581 Secondary hyperparathyroidism of renal origin: Secondary | ICD-10-CM | POA: Diagnosis not present

## 2012-08-13 DIAGNOSIS — D509 Iron deficiency anemia, unspecified: Secondary | ICD-10-CM | POA: Diagnosis not present

## 2012-08-13 DIAGNOSIS — N186 End stage renal disease: Secondary | ICD-10-CM | POA: Diagnosis not present

## 2012-08-15 DIAGNOSIS — N186 End stage renal disease: Secondary | ICD-10-CM | POA: Diagnosis not present

## 2012-08-15 DIAGNOSIS — N2581 Secondary hyperparathyroidism of renal origin: Secondary | ICD-10-CM | POA: Diagnosis not present

## 2012-08-15 DIAGNOSIS — D509 Iron deficiency anemia, unspecified: Secondary | ICD-10-CM | POA: Diagnosis not present

## 2012-08-16 DIAGNOSIS — N2581 Secondary hyperparathyroidism of renal origin: Secondary | ICD-10-CM | POA: Diagnosis not present

## 2012-08-16 DIAGNOSIS — D509 Iron deficiency anemia, unspecified: Secondary | ICD-10-CM | POA: Diagnosis not present

## 2012-08-16 DIAGNOSIS — N186 End stage renal disease: Secondary | ICD-10-CM | POA: Diagnosis not present

## 2012-08-18 DIAGNOSIS — N2581 Secondary hyperparathyroidism of renal origin: Secondary | ICD-10-CM | POA: Diagnosis not present

## 2012-08-18 DIAGNOSIS — D509 Iron deficiency anemia, unspecified: Secondary | ICD-10-CM | POA: Diagnosis not present

## 2012-08-18 DIAGNOSIS — N186 End stage renal disease: Secondary | ICD-10-CM | POA: Diagnosis not present

## 2012-08-19 DIAGNOSIS — D509 Iron deficiency anemia, unspecified: Secondary | ICD-10-CM | POA: Diagnosis not present

## 2012-08-19 DIAGNOSIS — N2581 Secondary hyperparathyroidism of renal origin: Secondary | ICD-10-CM | POA: Diagnosis not present

## 2012-08-19 DIAGNOSIS — N186 End stage renal disease: Secondary | ICD-10-CM | POA: Diagnosis not present

## 2012-08-20 DIAGNOSIS — N186 End stage renal disease: Secondary | ICD-10-CM | POA: Diagnosis not present

## 2012-08-20 DIAGNOSIS — D509 Iron deficiency anemia, unspecified: Secondary | ICD-10-CM | POA: Diagnosis not present

## 2012-08-20 DIAGNOSIS — N2581 Secondary hyperparathyroidism of renal origin: Secondary | ICD-10-CM | POA: Diagnosis not present

## 2012-08-22 DIAGNOSIS — N2581 Secondary hyperparathyroidism of renal origin: Secondary | ICD-10-CM | POA: Diagnosis not present

## 2012-08-22 DIAGNOSIS — N186 End stage renal disease: Secondary | ICD-10-CM | POA: Diagnosis not present

## 2012-08-22 DIAGNOSIS — D509 Iron deficiency anemia, unspecified: Secondary | ICD-10-CM | POA: Diagnosis not present

## 2012-08-23 DIAGNOSIS — N186 End stage renal disease: Secondary | ICD-10-CM | POA: Diagnosis not present

## 2012-08-23 DIAGNOSIS — D509 Iron deficiency anemia, unspecified: Secondary | ICD-10-CM | POA: Diagnosis not present

## 2012-08-23 DIAGNOSIS — N2581 Secondary hyperparathyroidism of renal origin: Secondary | ICD-10-CM | POA: Diagnosis not present

## 2012-08-25 ENCOUNTER — Encounter: Payer: Self-pay | Admitting: Internal Medicine

## 2012-08-25 DIAGNOSIS — N2581 Secondary hyperparathyroidism of renal origin: Secondary | ICD-10-CM | POA: Diagnosis not present

## 2012-08-25 DIAGNOSIS — N186 End stage renal disease: Secondary | ICD-10-CM | POA: Diagnosis not present

## 2012-08-25 DIAGNOSIS — D509 Iron deficiency anemia, unspecified: Secondary | ICD-10-CM | POA: Diagnosis not present

## 2012-08-26 DIAGNOSIS — N186 End stage renal disease: Secondary | ICD-10-CM | POA: Diagnosis not present

## 2012-08-26 DIAGNOSIS — Z992 Dependence on renal dialysis: Secondary | ICD-10-CM | POA: Diagnosis not present

## 2012-08-26 DIAGNOSIS — D509 Iron deficiency anemia, unspecified: Secondary | ICD-10-CM | POA: Diagnosis not present

## 2012-08-27 DIAGNOSIS — N186 End stage renal disease: Secondary | ICD-10-CM | POA: Diagnosis not present

## 2012-08-27 DIAGNOSIS — Z992 Dependence on renal dialysis: Secondary | ICD-10-CM | POA: Diagnosis not present

## 2012-08-27 DIAGNOSIS — D509 Iron deficiency anemia, unspecified: Secondary | ICD-10-CM | POA: Diagnosis not present

## 2012-08-29 DIAGNOSIS — N186 End stage renal disease: Secondary | ICD-10-CM | POA: Diagnosis not present

## 2012-08-29 DIAGNOSIS — D509 Iron deficiency anemia, unspecified: Secondary | ICD-10-CM | POA: Diagnosis not present

## 2012-08-29 DIAGNOSIS — Z992 Dependence on renal dialysis: Secondary | ICD-10-CM | POA: Diagnosis not present

## 2012-08-30 DIAGNOSIS — N186 End stage renal disease: Secondary | ICD-10-CM | POA: Diagnosis not present

## 2012-08-30 DIAGNOSIS — K591 Functional diarrhea: Secondary | ICD-10-CM | POA: Diagnosis not present

## 2012-08-30 DIAGNOSIS — Z992 Dependence on renal dialysis: Secondary | ICD-10-CM | POA: Diagnosis not present

## 2012-08-30 DIAGNOSIS — R933 Abnormal findings on diagnostic imaging of other parts of digestive tract: Secondary | ICD-10-CM | POA: Diagnosis not present

## 2012-08-30 DIAGNOSIS — I4891 Unspecified atrial fibrillation: Secondary | ICD-10-CM | POA: Diagnosis not present

## 2012-08-30 DIAGNOSIS — D509 Iron deficiency anemia, unspecified: Secondary | ICD-10-CM | POA: Diagnosis not present

## 2012-08-31 ENCOUNTER — Ambulatory Visit: Payer: Self-pay | Admitting: Unknown Physician Specialty

## 2012-08-31 DIAGNOSIS — Z9089 Acquired absence of other organs: Secondary | ICD-10-CM | POA: Diagnosis not present

## 2012-08-31 DIAGNOSIS — R109 Unspecified abdominal pain: Secondary | ICD-10-CM | POA: Diagnosis not present

## 2012-08-31 DIAGNOSIS — K591 Functional diarrhea: Secondary | ICD-10-CM | POA: Diagnosis not present

## 2012-08-31 DIAGNOSIS — O9989 Other specified diseases and conditions complicating pregnancy, childbirth and the puerperium: Secondary | ICD-10-CM | POA: Diagnosis not present

## 2012-08-31 DIAGNOSIS — D649 Anemia, unspecified: Secondary | ICD-10-CM | POA: Diagnosis not present

## 2012-09-01 DIAGNOSIS — N186 End stage renal disease: Secondary | ICD-10-CM | POA: Diagnosis not present

## 2012-09-01 DIAGNOSIS — D509 Iron deficiency anemia, unspecified: Secondary | ICD-10-CM | POA: Diagnosis not present

## 2012-09-01 DIAGNOSIS — Z992 Dependence on renal dialysis: Secondary | ICD-10-CM | POA: Diagnosis not present

## 2012-09-02 DIAGNOSIS — D649 Anemia, unspecified: Secondary | ICD-10-CM | POA: Diagnosis not present

## 2012-09-02 DIAGNOSIS — Z992 Dependence on renal dialysis: Secondary | ICD-10-CM | POA: Diagnosis not present

## 2012-09-02 DIAGNOSIS — D509 Iron deficiency anemia, unspecified: Secondary | ICD-10-CM | POA: Diagnosis not present

## 2012-09-02 DIAGNOSIS — N186 End stage renal disease: Secondary | ICD-10-CM | POA: Diagnosis not present

## 2012-09-03 DIAGNOSIS — D509 Iron deficiency anemia, unspecified: Secondary | ICD-10-CM | POA: Diagnosis not present

## 2012-09-03 DIAGNOSIS — Z992 Dependence on renal dialysis: Secondary | ICD-10-CM | POA: Diagnosis not present

## 2012-09-03 DIAGNOSIS — N186 End stage renal disease: Secondary | ICD-10-CM | POA: Diagnosis not present

## 2012-09-05 DIAGNOSIS — N186 End stage renal disease: Secondary | ICD-10-CM | POA: Diagnosis not present

## 2012-09-05 DIAGNOSIS — Z992 Dependence on renal dialysis: Secondary | ICD-10-CM | POA: Diagnosis not present

## 2012-09-05 DIAGNOSIS — D509 Iron deficiency anemia, unspecified: Secondary | ICD-10-CM | POA: Diagnosis not present

## 2012-09-05 DIAGNOSIS — K591 Functional diarrhea: Secondary | ICD-10-CM | POA: Diagnosis not present

## 2012-09-06 DIAGNOSIS — N186 End stage renal disease: Secondary | ICD-10-CM | POA: Diagnosis not present

## 2012-09-06 DIAGNOSIS — Z992 Dependence on renal dialysis: Secondary | ICD-10-CM | POA: Diagnosis not present

## 2012-09-06 DIAGNOSIS — D509 Iron deficiency anemia, unspecified: Secondary | ICD-10-CM | POA: Diagnosis not present

## 2012-09-08 DIAGNOSIS — D509 Iron deficiency anemia, unspecified: Secondary | ICD-10-CM | POA: Diagnosis not present

## 2012-09-08 DIAGNOSIS — Z992 Dependence on renal dialysis: Secondary | ICD-10-CM | POA: Diagnosis not present

## 2012-09-08 DIAGNOSIS — N186 End stage renal disease: Secondary | ICD-10-CM | POA: Diagnosis not present

## 2012-09-09 DIAGNOSIS — D509 Iron deficiency anemia, unspecified: Secondary | ICD-10-CM | POA: Diagnosis not present

## 2012-09-09 DIAGNOSIS — N186 End stage renal disease: Secondary | ICD-10-CM | POA: Diagnosis not present

## 2012-09-09 DIAGNOSIS — Z992 Dependence on renal dialysis: Secondary | ICD-10-CM | POA: Diagnosis not present

## 2012-09-10 DIAGNOSIS — Z992 Dependence on renal dialysis: Secondary | ICD-10-CM | POA: Diagnosis not present

## 2012-09-10 DIAGNOSIS — D509 Iron deficiency anemia, unspecified: Secondary | ICD-10-CM | POA: Diagnosis not present

## 2012-09-10 DIAGNOSIS — N186 End stage renal disease: Secondary | ICD-10-CM | POA: Diagnosis not present

## 2012-09-12 DIAGNOSIS — Z992 Dependence on renal dialysis: Secondary | ICD-10-CM | POA: Diagnosis not present

## 2012-09-12 DIAGNOSIS — D509 Iron deficiency anemia, unspecified: Secondary | ICD-10-CM | POA: Diagnosis not present

## 2012-09-12 DIAGNOSIS — N186 End stage renal disease: Secondary | ICD-10-CM | POA: Diagnosis not present

## 2012-09-13 DIAGNOSIS — D509 Iron deficiency anemia, unspecified: Secondary | ICD-10-CM | POA: Diagnosis not present

## 2012-09-13 DIAGNOSIS — Z992 Dependence on renal dialysis: Secondary | ICD-10-CM | POA: Diagnosis not present

## 2012-09-13 DIAGNOSIS — N186 End stage renal disease: Secondary | ICD-10-CM | POA: Diagnosis not present

## 2012-09-14 DIAGNOSIS — R933 Abnormal findings on diagnostic imaging of other parts of digestive tract: Secondary | ICD-10-CM | POA: Diagnosis not present

## 2012-09-14 DIAGNOSIS — K591 Functional diarrhea: Secondary | ICD-10-CM | POA: Diagnosis not present

## 2012-09-15 DIAGNOSIS — N186 End stage renal disease: Secondary | ICD-10-CM | POA: Diagnosis not present

## 2012-09-15 DIAGNOSIS — D509 Iron deficiency anemia, unspecified: Secondary | ICD-10-CM | POA: Diagnosis not present

## 2012-09-15 DIAGNOSIS — Z992 Dependence on renal dialysis: Secondary | ICD-10-CM | POA: Diagnosis not present

## 2012-09-16 DIAGNOSIS — N186 End stage renal disease: Secondary | ICD-10-CM | POA: Diagnosis not present

## 2012-09-16 DIAGNOSIS — D509 Iron deficiency anemia, unspecified: Secondary | ICD-10-CM | POA: Diagnosis not present

## 2012-09-16 DIAGNOSIS — Z992 Dependence on renal dialysis: Secondary | ICD-10-CM | POA: Diagnosis not present

## 2012-09-17 DIAGNOSIS — N186 End stage renal disease: Secondary | ICD-10-CM | POA: Diagnosis not present

## 2012-09-17 DIAGNOSIS — D509 Iron deficiency anemia, unspecified: Secondary | ICD-10-CM | POA: Diagnosis not present

## 2012-09-17 DIAGNOSIS — Z992 Dependence on renal dialysis: Secondary | ICD-10-CM | POA: Diagnosis not present

## 2012-09-19 DIAGNOSIS — D509 Iron deficiency anemia, unspecified: Secondary | ICD-10-CM | POA: Diagnosis not present

## 2012-09-19 DIAGNOSIS — Z992 Dependence on renal dialysis: Secondary | ICD-10-CM | POA: Diagnosis not present

## 2012-09-19 DIAGNOSIS — N186 End stage renal disease: Secondary | ICD-10-CM | POA: Diagnosis not present

## 2012-09-20 DIAGNOSIS — N186 End stage renal disease: Secondary | ICD-10-CM | POA: Diagnosis not present

## 2012-09-20 DIAGNOSIS — D509 Iron deficiency anemia, unspecified: Secondary | ICD-10-CM | POA: Diagnosis not present

## 2012-09-20 DIAGNOSIS — Z992 Dependence on renal dialysis: Secondary | ICD-10-CM | POA: Diagnosis not present

## 2012-09-22 DIAGNOSIS — D509 Iron deficiency anemia, unspecified: Secondary | ICD-10-CM | POA: Diagnosis not present

## 2012-09-22 DIAGNOSIS — Z992 Dependence on renal dialysis: Secondary | ICD-10-CM | POA: Diagnosis not present

## 2012-09-22 DIAGNOSIS — N186 End stage renal disease: Secondary | ICD-10-CM | POA: Diagnosis not present

## 2012-09-23 DIAGNOSIS — Z992 Dependence on renal dialysis: Secondary | ICD-10-CM | POA: Diagnosis not present

## 2012-09-23 DIAGNOSIS — D509 Iron deficiency anemia, unspecified: Secondary | ICD-10-CM | POA: Diagnosis not present

## 2012-09-23 DIAGNOSIS — N186 End stage renal disease: Secondary | ICD-10-CM | POA: Diagnosis not present

## 2012-09-24 DIAGNOSIS — N186 End stage renal disease: Secondary | ICD-10-CM | POA: Diagnosis not present

## 2012-09-24 DIAGNOSIS — D509 Iron deficiency anemia, unspecified: Secondary | ICD-10-CM | POA: Diagnosis not present

## 2012-09-24 DIAGNOSIS — Z992 Dependence on renal dialysis: Secondary | ICD-10-CM | POA: Diagnosis not present

## 2012-09-26 DIAGNOSIS — Z23 Encounter for immunization: Secondary | ICD-10-CM | POA: Diagnosis not present

## 2012-09-26 DIAGNOSIS — N186 End stage renal disease: Secondary | ICD-10-CM | POA: Diagnosis not present

## 2012-09-26 DIAGNOSIS — D509 Iron deficiency anemia, unspecified: Secondary | ICD-10-CM | POA: Diagnosis not present

## 2012-09-27 DIAGNOSIS — Z23 Encounter for immunization: Secondary | ICD-10-CM | POA: Diagnosis not present

## 2012-09-27 DIAGNOSIS — D509 Iron deficiency anemia, unspecified: Secondary | ICD-10-CM | POA: Diagnosis not present

## 2012-09-27 DIAGNOSIS — N186 End stage renal disease: Secondary | ICD-10-CM | POA: Diagnosis not present

## 2012-09-29 ENCOUNTER — Other Ambulatory Visit: Payer: Self-pay | Admitting: *Deleted

## 2012-09-29 DIAGNOSIS — F329 Major depressive disorder, single episode, unspecified: Secondary | ICD-10-CM

## 2012-09-29 DIAGNOSIS — Z23 Encounter for immunization: Secondary | ICD-10-CM | POA: Diagnosis not present

## 2012-09-29 DIAGNOSIS — N186 End stage renal disease: Secondary | ICD-10-CM | POA: Diagnosis not present

## 2012-09-29 DIAGNOSIS — D509 Iron deficiency anemia, unspecified: Secondary | ICD-10-CM | POA: Diagnosis not present

## 2012-09-29 MED ORDER — BUPROPION HCL ER (SR) 150 MG PO TB12: 150.0000 mg | ORAL_TABLET | Freq: Two times a day (BID) | ORAL | Status: DC

## 2012-09-29 NOTE — Telephone Encounter (Signed)
Eprescribed.

## 2012-09-30 DIAGNOSIS — N186 End stage renal disease: Secondary | ICD-10-CM | POA: Diagnosis not present

## 2012-09-30 DIAGNOSIS — D509 Iron deficiency anemia, unspecified: Secondary | ICD-10-CM | POA: Diagnosis not present

## 2012-09-30 DIAGNOSIS — Z23 Encounter for immunization: Secondary | ICD-10-CM | POA: Diagnosis not present

## 2012-10-01 DIAGNOSIS — N186 End stage renal disease: Secondary | ICD-10-CM | POA: Diagnosis not present

## 2012-10-01 DIAGNOSIS — Z23 Encounter for immunization: Secondary | ICD-10-CM | POA: Diagnosis not present

## 2012-10-01 DIAGNOSIS — D509 Iron deficiency anemia, unspecified: Secondary | ICD-10-CM | POA: Diagnosis not present

## 2012-10-03 DIAGNOSIS — Z23 Encounter for immunization: Secondary | ICD-10-CM | POA: Diagnosis not present

## 2012-10-03 DIAGNOSIS — N186 End stage renal disease: Secondary | ICD-10-CM | POA: Diagnosis not present

## 2012-10-03 DIAGNOSIS — D509 Iron deficiency anemia, unspecified: Secondary | ICD-10-CM | POA: Diagnosis not present

## 2012-10-04 DIAGNOSIS — Z23 Encounter for immunization: Secondary | ICD-10-CM | POA: Diagnosis not present

## 2012-10-04 DIAGNOSIS — N186 End stage renal disease: Secondary | ICD-10-CM | POA: Diagnosis not present

## 2012-10-04 DIAGNOSIS — D509 Iron deficiency anemia, unspecified: Secondary | ICD-10-CM | POA: Diagnosis not present

## 2012-10-06 DIAGNOSIS — N186 End stage renal disease: Secondary | ICD-10-CM | POA: Diagnosis not present

## 2012-10-06 DIAGNOSIS — D509 Iron deficiency anemia, unspecified: Secondary | ICD-10-CM | POA: Diagnosis not present

## 2012-10-06 DIAGNOSIS — Z23 Encounter for immunization: Secondary | ICD-10-CM | POA: Diagnosis not present

## 2012-10-07 DIAGNOSIS — N186 End stage renal disease: Secondary | ICD-10-CM | POA: Diagnosis not present

## 2012-10-07 DIAGNOSIS — D509 Iron deficiency anemia, unspecified: Secondary | ICD-10-CM | POA: Diagnosis not present

## 2012-10-07 DIAGNOSIS — Z23 Encounter for immunization: Secondary | ICD-10-CM | POA: Diagnosis not present

## 2012-10-08 DIAGNOSIS — N186 End stage renal disease: Secondary | ICD-10-CM | POA: Diagnosis not present

## 2012-10-08 DIAGNOSIS — D509 Iron deficiency anemia, unspecified: Secondary | ICD-10-CM | POA: Diagnosis not present

## 2012-10-08 DIAGNOSIS — Z23 Encounter for immunization: Secondary | ICD-10-CM | POA: Diagnosis not present

## 2012-10-10 DIAGNOSIS — N186 End stage renal disease: Secondary | ICD-10-CM | POA: Diagnosis not present

## 2012-10-10 DIAGNOSIS — D509 Iron deficiency anemia, unspecified: Secondary | ICD-10-CM | POA: Diagnosis not present

## 2012-10-10 DIAGNOSIS — Z23 Encounter for immunization: Secondary | ICD-10-CM | POA: Diagnosis not present

## 2012-10-11 DIAGNOSIS — N186 End stage renal disease: Secondary | ICD-10-CM | POA: Diagnosis not present

## 2012-10-11 DIAGNOSIS — D509 Iron deficiency anemia, unspecified: Secondary | ICD-10-CM | POA: Diagnosis not present

## 2012-10-11 DIAGNOSIS — Z23 Encounter for immunization: Secondary | ICD-10-CM | POA: Diagnosis not present

## 2012-10-13 DIAGNOSIS — Z23 Encounter for immunization: Secondary | ICD-10-CM | POA: Diagnosis not present

## 2012-10-13 DIAGNOSIS — N186 End stage renal disease: Secondary | ICD-10-CM | POA: Diagnosis not present

## 2012-10-13 DIAGNOSIS — D509 Iron deficiency anemia, unspecified: Secondary | ICD-10-CM | POA: Diagnosis not present

## 2012-10-14 DIAGNOSIS — D509 Iron deficiency anemia, unspecified: Secondary | ICD-10-CM | POA: Diagnosis not present

## 2012-10-14 DIAGNOSIS — N186 End stage renal disease: Secondary | ICD-10-CM | POA: Diagnosis not present

## 2012-10-14 DIAGNOSIS — Z23 Encounter for immunization: Secondary | ICD-10-CM | POA: Diagnosis not present

## 2012-10-15 DIAGNOSIS — N186 End stage renal disease: Secondary | ICD-10-CM | POA: Diagnosis not present

## 2012-10-15 DIAGNOSIS — D509 Iron deficiency anemia, unspecified: Secondary | ICD-10-CM | POA: Diagnosis not present

## 2012-10-15 DIAGNOSIS — Z23 Encounter for immunization: Secondary | ICD-10-CM | POA: Diagnosis not present

## 2012-10-17 DIAGNOSIS — N186 End stage renal disease: Secondary | ICD-10-CM | POA: Diagnosis not present

## 2012-10-17 DIAGNOSIS — D509 Iron deficiency anemia, unspecified: Secondary | ICD-10-CM | POA: Diagnosis not present

## 2012-10-17 DIAGNOSIS — Z23 Encounter for immunization: Secondary | ICD-10-CM | POA: Diagnosis not present

## 2012-10-18 DIAGNOSIS — N186 End stage renal disease: Secondary | ICD-10-CM | POA: Diagnosis not present

## 2012-10-18 DIAGNOSIS — Z23 Encounter for immunization: Secondary | ICD-10-CM | POA: Diagnosis not present

## 2012-10-18 DIAGNOSIS — D509 Iron deficiency anemia, unspecified: Secondary | ICD-10-CM | POA: Diagnosis not present

## 2012-10-20 DIAGNOSIS — D509 Iron deficiency anemia, unspecified: Secondary | ICD-10-CM | POA: Diagnosis not present

## 2012-10-20 DIAGNOSIS — N186 End stage renal disease: Secondary | ICD-10-CM | POA: Diagnosis not present

## 2012-10-20 DIAGNOSIS — Z23 Encounter for immunization: Secondary | ICD-10-CM | POA: Diagnosis not present

## 2012-10-21 DIAGNOSIS — N186 End stage renal disease: Secondary | ICD-10-CM | POA: Diagnosis not present

## 2012-10-21 DIAGNOSIS — D509 Iron deficiency anemia, unspecified: Secondary | ICD-10-CM | POA: Diagnosis not present

## 2012-10-21 DIAGNOSIS — Z23 Encounter for immunization: Secondary | ICD-10-CM | POA: Diagnosis not present

## 2012-10-22 DIAGNOSIS — D509 Iron deficiency anemia, unspecified: Secondary | ICD-10-CM | POA: Diagnosis not present

## 2012-10-22 DIAGNOSIS — N186 End stage renal disease: Secondary | ICD-10-CM | POA: Diagnosis not present

## 2012-10-22 DIAGNOSIS — Z23 Encounter for immunization: Secondary | ICD-10-CM | POA: Diagnosis not present

## 2012-10-24 DIAGNOSIS — N186 End stage renal disease: Secondary | ICD-10-CM | POA: Diagnosis not present

## 2012-10-24 DIAGNOSIS — Z23 Encounter for immunization: Secondary | ICD-10-CM | POA: Diagnosis not present

## 2012-10-24 DIAGNOSIS — D509 Iron deficiency anemia, unspecified: Secondary | ICD-10-CM | POA: Diagnosis not present

## 2012-10-25 DIAGNOSIS — N186 End stage renal disease: Secondary | ICD-10-CM | POA: Diagnosis not present

## 2012-10-25 DIAGNOSIS — Z23 Encounter for immunization: Secondary | ICD-10-CM | POA: Diagnosis not present

## 2012-10-25 DIAGNOSIS — D509 Iron deficiency anemia, unspecified: Secondary | ICD-10-CM | POA: Diagnosis not present

## 2012-10-26 DIAGNOSIS — N186 End stage renal disease: Secondary | ICD-10-CM | POA: Diagnosis not present

## 2012-10-27 DIAGNOSIS — N2581 Secondary hyperparathyroidism of renal origin: Secondary | ICD-10-CM | POA: Diagnosis not present

## 2012-10-27 DIAGNOSIS — N186 End stage renal disease: Secondary | ICD-10-CM | POA: Diagnosis not present

## 2012-10-27 DIAGNOSIS — D509 Iron deficiency anemia, unspecified: Secondary | ICD-10-CM | POA: Diagnosis not present

## 2012-10-28 DIAGNOSIS — N2581 Secondary hyperparathyroidism of renal origin: Secondary | ICD-10-CM | POA: Diagnosis not present

## 2012-10-28 DIAGNOSIS — N186 End stage renal disease: Secondary | ICD-10-CM | POA: Diagnosis not present

## 2012-10-28 DIAGNOSIS — D509 Iron deficiency anemia, unspecified: Secondary | ICD-10-CM | POA: Diagnosis not present

## 2012-10-29 DIAGNOSIS — N2581 Secondary hyperparathyroidism of renal origin: Secondary | ICD-10-CM | POA: Diagnosis not present

## 2012-10-29 DIAGNOSIS — N186 End stage renal disease: Secondary | ICD-10-CM | POA: Diagnosis not present

## 2012-10-29 DIAGNOSIS — D509 Iron deficiency anemia, unspecified: Secondary | ICD-10-CM | POA: Diagnosis not present

## 2012-10-31 DIAGNOSIS — N2581 Secondary hyperparathyroidism of renal origin: Secondary | ICD-10-CM | POA: Diagnosis not present

## 2012-10-31 DIAGNOSIS — N186 End stage renal disease: Secondary | ICD-10-CM | POA: Diagnosis not present

## 2012-10-31 DIAGNOSIS — D509 Iron deficiency anemia, unspecified: Secondary | ICD-10-CM | POA: Diagnosis not present

## 2012-11-01 DIAGNOSIS — N2581 Secondary hyperparathyroidism of renal origin: Secondary | ICD-10-CM | POA: Diagnosis not present

## 2012-11-01 DIAGNOSIS — D509 Iron deficiency anemia, unspecified: Secondary | ICD-10-CM | POA: Diagnosis not present

## 2012-11-01 DIAGNOSIS — N186 End stage renal disease: Secondary | ICD-10-CM | POA: Diagnosis not present

## 2012-11-03 ENCOUNTER — Other Ambulatory Visit: Payer: Self-pay | Admitting: *Deleted

## 2012-11-03 DIAGNOSIS — N2581 Secondary hyperparathyroidism of renal origin: Secondary | ICD-10-CM | POA: Diagnosis not present

## 2012-11-03 DIAGNOSIS — F329 Major depressive disorder, single episode, unspecified: Secondary | ICD-10-CM

## 2012-11-03 DIAGNOSIS — N186 End stage renal disease: Secondary | ICD-10-CM | POA: Diagnosis not present

## 2012-11-03 DIAGNOSIS — D509 Iron deficiency anemia, unspecified: Secondary | ICD-10-CM | POA: Diagnosis not present

## 2012-11-03 MED ORDER — BUPROPION HCL ER (SR) 150 MG PO TB12: 150.0000 mg | ORAL_TABLET | Freq: Two times a day (BID) | ORAL | Status: DC

## 2012-11-03 NOTE — Telephone Encounter (Signed)
Rx sent to pharmacy   

## 2012-11-04 DIAGNOSIS — N2581 Secondary hyperparathyroidism of renal origin: Secondary | ICD-10-CM | POA: Diagnosis not present

## 2012-11-04 DIAGNOSIS — N186 End stage renal disease: Secondary | ICD-10-CM | POA: Diagnosis not present

## 2012-11-04 DIAGNOSIS — D509 Iron deficiency anemia, unspecified: Secondary | ICD-10-CM | POA: Diagnosis not present

## 2012-11-05 DIAGNOSIS — D509 Iron deficiency anemia, unspecified: Secondary | ICD-10-CM | POA: Diagnosis not present

## 2012-11-05 DIAGNOSIS — N2581 Secondary hyperparathyroidism of renal origin: Secondary | ICD-10-CM | POA: Diagnosis not present

## 2012-11-05 DIAGNOSIS — N186 End stage renal disease: Secondary | ICD-10-CM | POA: Diagnosis not present

## 2012-11-07 DIAGNOSIS — N2581 Secondary hyperparathyroidism of renal origin: Secondary | ICD-10-CM | POA: Diagnosis not present

## 2012-11-07 DIAGNOSIS — D509 Iron deficiency anemia, unspecified: Secondary | ICD-10-CM | POA: Diagnosis not present

## 2012-11-07 DIAGNOSIS — N186 End stage renal disease: Secondary | ICD-10-CM | POA: Diagnosis not present

## 2012-11-08 DIAGNOSIS — N186 End stage renal disease: Secondary | ICD-10-CM | POA: Diagnosis not present

## 2012-11-08 DIAGNOSIS — D509 Iron deficiency anemia, unspecified: Secondary | ICD-10-CM | POA: Diagnosis not present

## 2012-11-08 DIAGNOSIS — N2581 Secondary hyperparathyroidism of renal origin: Secondary | ICD-10-CM | POA: Diagnosis not present

## 2012-11-09 ENCOUNTER — Encounter: Payer: Self-pay | Admitting: *Deleted

## 2012-11-09 ENCOUNTER — Encounter: Payer: Medicare Other | Admitting: Internal Medicine

## 2012-11-10 ENCOUNTER — Ambulatory Visit (INDEPENDENT_AMBULATORY_CARE_PROVIDER_SITE_OTHER): Payer: Medicare Other | Admitting: Internal Medicine

## 2012-11-10 ENCOUNTER — Encounter: Payer: Self-pay | Admitting: Internal Medicine

## 2012-11-10 VITALS — BP 140/70 | HR 68 | Temp 98.4°F | Ht 64.5 in | Wt 141.0 lb

## 2012-11-10 DIAGNOSIS — K589 Irritable bowel syndrome without diarrhea: Secondary | ICD-10-CM

## 2012-11-10 DIAGNOSIS — M542 Cervicalgia: Secondary | ICD-10-CM

## 2012-11-10 DIAGNOSIS — F329 Major depressive disorder, single episode, unspecified: Secondary | ICD-10-CM

## 2012-11-10 DIAGNOSIS — N185 Chronic kidney disease, stage 5: Secondary | ICD-10-CM | POA: Diagnosis not present

## 2012-11-10 DIAGNOSIS — Z7189 Other specified counseling: Secondary | ICD-10-CM | POA: Insufficient documentation

## 2012-11-10 DIAGNOSIS — N186 End stage renal disease: Secondary | ICD-10-CM | POA: Diagnosis not present

## 2012-11-10 DIAGNOSIS — Z0000 Encounter for general adult medical examination without abnormal findings: Secondary | ICD-10-CM | POA: Diagnosis not present

## 2012-11-10 DIAGNOSIS — D509 Iron deficiency anemia, unspecified: Secondary | ICD-10-CM | POA: Diagnosis not present

## 2012-11-10 DIAGNOSIS — N2581 Secondary hyperparathyroidism of renal origin: Secondary | ICD-10-CM | POA: Diagnosis not present

## 2012-11-10 MED ORDER — BUPROPION HCL ER (SR) 150 MG PO TB12: 150.0000 mg | ORAL_TABLET | Freq: Two times a day (BID) | ORAL | Status: DC

## 2012-11-10 NOTE — Assessment & Plan Note (Signed)
General medical exam including breast exam normal today. Pap and pelvic deferred given patient's age. Mammogram is up-to-date. Colonoscopy up-to-date. Encouraged continued healthy diet and regular physical activity as tolerated. Immunizations are up-to-date. Will request recent labs from her nephrologist.

## 2012-11-10 NOTE — Progress Notes (Signed)
Subjective:    Patient ID: Sharon [name], female    DOB: 18-Jan-1927, 79 y.o.   MRN: [id number]  HPI The patient is here for annual Medicare wellness examination and management of other chronic and acute problems.   The risk factors are reflected in the social history.  The roster of all physicians providing medical care to patient - is listed in the Snapshot section of the chart.  Activities of daily living:  The patient is 100% independent in all ADLs: dressing, toileting, feeding as well as independent mobility  Home safety : The patient has smoke detectors in the home. They wear seatbelts.  There are no firearms at home. There is no violence in the home. Wears an alert device at home.  There is no risks for hepatitis, STDs or HIV. There is a history of blood transfusion in the distant past, related to menorrhagia, prior to hysterectomy. They have no travel history to infectious disease endemic areas of the world.  The patient has seen their dentist in the last six month. Dentist - Dr. Axel Filler They have seen their eye doctor in the last year. Opthalmology - [facility] Some history of hearing loss.  They have deferred audiologic testing in the last year.  Followed by Dr. Jenne Campus They do not  have excessive sun exposure. Discussed the need for sun protection: hats, long sleeves and use of sunscreen if there is significant sun exposure. Dermatologist - Dr. Orson Aloe GI - Dr. Markham Jordan Nephrology - Dr. Cherylann Ratel  Diet: the importance of a healthy diet is discussed. They do have a healthy diet.  The benefits of regular aerobic exercise were discussed. Exercise limited because of leg pain.  Depression screen: there are no signs or vegative symptoms of depression- irritability, change in appetite, anhedonia, sadness/tearfullness.  Cognitive assessment: the patient manages all their financial and personal affairs and is actively engaged. They could relate day,date,year and events.  The  following portions of the patient's history were reviewed and updated as appropriate: allergies, current medications, past family history, past medical history,  past surgical history, past social history  and problem list.  Visual acuity was not assessed per patient preference since she has regular follow up with her ophthalmologist. Hearing and body mass index were assessed and reviewed.   During the course of the visit the patient was educated and counseled about appropriate screening and preventive services including : fall prevention , diabetes screening, nutrition counseling, colorectal cancer screening, and recommended immunizations.    Outpatient Encounter Prescriptions as of 11/10/2012  Medication Sig Dispense Refill  . buPROPion (WELLBUTRIN SR) 150 MG 12 hr tablet Take 1 tablet (150 mg total) by mouth 2 (two) times daily.  60 tablet  3  . calcitRIOL (ROCALTROL) 0.25 MCG capsule Take 0.25 mcg by mouth every other day.      . estrogens, conjugated, (PREMARIN) 0.3 MG tablet Take 1 pill daily  30 tablet  11  . folic acid-vitamin b complex-vitamin c-selenium-zinc (DIALYVITE) 3 MG TABS Take 1 tablet by mouth daily.      Marland Kitchen gentamicin ointment (GARAMYCIN) 0.1 %       . metoprolol succinate (TOPROL-XL) 25 MG 24 hr tablet Take 1 tablet (25 mg total) by mouth daily.  30 tablet  6  . PREVALITE 4 GM/DOSE powder Take by mouth.       . triamterene-hydrochlorothiazide (MAXZIDE) 75-50 MG per tablet Take 1 tablet by mouth daily.  90 tablet  4  . dicyclomine (BENTYL) 20 MG tablet  Take 20 mg by mouth 2 (two) times daily as needed (two times a day as needed for IBS).       Marland Kitchen ergocalciferol (VITAMIN D2) 50000 UNITS capsule Take 50,000 Units by mouth once a week.      Marland Kitchen HYDROcodone-acetaminophen (NORCO/VICODIN) 5-325 MG per tablet Take 1 tablet by mouth every 8 (eight) hours as needed for pain.  90 tablet  3   No facility-administered encounter medications on file as of 11/10/2012.   BP 140/70  Pulse 68   Temp(Src) 98.4 F (36.9 C) (Oral)  Ht 5' 4.5" (1.638 m)  Wt 141 lb (63.957 kg)  BMI 23.84 kg/m2  SpO2 99%   Review of Systems  Constitutional: Negative for fever, chills, appetite change, fatigue and unexpected weight change.  HENT: Negative for congestion, ear pain, sinus pressure, sore throat, trouble swallowing and voice change.   Eyes: Negative for visual disturbance.  Respiratory: Negative for cough, shortness of breath, wheezing and stridor.   Cardiovascular: Negative for chest pain, palpitations and leg swelling.  Gastrointestinal: Negative for nausea, vomiting, abdominal pain, diarrhea, constipation, blood in stool, abdominal distention and anal bleeding.  Genitourinary: Negative for dysuria and flank pain.  Musculoskeletal: Positive for arthralgias. Negative for gait problem, myalgias and neck pain.  Skin: Negative for color change and rash.  Neurological: Negative for dizziness and headaches.  Hematological: Negative for adenopathy. Does not bruise/bleed easily.  Psychiatric/Behavioral: Negative for suicidal ideas, sleep disturbance and dysphoric mood. The patient is not nervous/anxious.        Objective:   Physical Exam  Constitutional: She is oriented to person, place, and time. She appears well-developed and well-nourished. No distress.  HENT:  Head: Normocephalic and atraumatic.  Right Ear: External ear normal.  Left Ear: External ear normal.  Nose: Nose normal.  Mouth/Throat: Oropharynx is clear and moist. No oropharyngeal exudate.  Eyes: Conjunctivae are normal. Pupils are equal, round, and reactive to light. Right eye exhibits no discharge. Left eye exhibits no discharge. No scleral icterus.  Neck: Normal range of motion. Neck supple. No tracheal deviation present. No thyromegaly present.  Cardiovascular: Normal rate, regular rhythm, normal heart sounds and intact distal pulses.  Exam reveals no gallop and no friction rub.   No murmur heard. Pulmonary/Chest:  Effort normal and breath sounds normal. No accessory muscle usage. Not tachypneic. No respiratory distress. She has no decreased breath sounds. She has no wheezes. She has no rhonchi. She has no rales. She exhibits no tenderness. Right breast exhibits no inverted nipple, no mass, no nipple discharge, no skin change and no tenderness. Left breast exhibits no inverted nipple, no mass, no nipple discharge, no skin change and no tenderness. Breasts are symmetrical.  Abdominal: Soft. Bowel sounds are normal. She exhibits no distension and no mass. There is no tenderness. There is no rebound and no guarding.    Musculoskeletal: Normal range of motion. She exhibits no edema and no tenderness.  Lymphadenopathy:    She has no cervical adenopathy.  Neurological: She is alert and oriented to person, place, and time. No cranial nerve deficit. She exhibits normal muscle tone. Coordination normal.  Skin: Skin is warm and dry. No rash noted. She is not diaphoretic. No erythema. No pallor.  Psychiatric: She has a normal mood and affect. Her behavior is normal. Judgment and thought content normal.          Assessment & Plan:

## 2012-11-10 NOTE — Assessment & Plan Note (Signed)
Tolerating PD well. Will request recent labs from her nephrologist.

## 2012-11-10 NOTE — Assessment & Plan Note (Signed)
Symptoms well controlled on Wellbutrin.

## 2012-11-10 NOTE — Assessment & Plan Note (Signed)
Symptoms poorly controlled on with current medication, Bentyl. Currently, pt avoids diarrhea by food avoidance when travelling outside her home. Followed by GI, Dr. Markham Jordan. Decision made not to pursue repeat colonoscopy given pt age. Will continue to follow.

## 2012-11-10 NOTE — Assessment & Plan Note (Signed)
Secondary to degenerative changes. Discussed potential referral to orthopedics for steroid injection and/or further evaluation. Pt declines.

## 2012-11-11 DIAGNOSIS — D509 Iron deficiency anemia, unspecified: Secondary | ICD-10-CM | POA: Diagnosis not present

## 2012-11-11 DIAGNOSIS — N186 End stage renal disease: Secondary | ICD-10-CM | POA: Diagnosis not present

## 2012-11-11 DIAGNOSIS — N2581 Secondary hyperparathyroidism of renal origin: Secondary | ICD-10-CM | POA: Diagnosis not present

## 2012-11-12 DIAGNOSIS — N186 End stage renal disease: Secondary | ICD-10-CM | POA: Diagnosis not present

## 2012-11-12 DIAGNOSIS — D509 Iron deficiency anemia, unspecified: Secondary | ICD-10-CM | POA: Diagnosis not present

## 2012-11-12 DIAGNOSIS — N2581 Secondary hyperparathyroidism of renal origin: Secondary | ICD-10-CM | POA: Diagnosis not present

## 2012-11-14 DIAGNOSIS — N2581 Secondary hyperparathyroidism of renal origin: Secondary | ICD-10-CM | POA: Diagnosis not present

## 2012-11-14 DIAGNOSIS — D509 Iron deficiency anemia, unspecified: Secondary | ICD-10-CM | POA: Diagnosis not present

## 2012-11-14 DIAGNOSIS — N186 End stage renal disease: Secondary | ICD-10-CM | POA: Diagnosis not present

## 2012-11-15 DIAGNOSIS — I10 Essential (primary) hypertension: Secondary | ICD-10-CM | POA: Diagnosis not present

## 2012-11-15 DIAGNOSIS — I4891 Unspecified atrial fibrillation: Secondary | ICD-10-CM | POA: Diagnosis not present

## 2012-11-15 DIAGNOSIS — D509 Iron deficiency anemia, unspecified: Secondary | ICD-10-CM | POA: Diagnosis not present

## 2012-11-15 DIAGNOSIS — N186 End stage renal disease: Secondary | ICD-10-CM | POA: Diagnosis not present

## 2012-11-15 DIAGNOSIS — I519 Heart disease, unspecified: Secondary | ICD-10-CM | POA: Diagnosis not present

## 2012-11-15 DIAGNOSIS — I2510 Atherosclerotic heart disease of native coronary artery without angina pectoris: Secondary | ICD-10-CM | POA: Diagnosis not present

## 2012-11-15 DIAGNOSIS — N2581 Secondary hyperparathyroidism of renal origin: Secondary | ICD-10-CM | POA: Diagnosis not present

## 2012-11-15 DIAGNOSIS — Z950 Presence of cardiac pacemaker: Secondary | ICD-10-CM | POA: Diagnosis not present

## 2012-11-17 DIAGNOSIS — N2581 Secondary hyperparathyroidism of renal origin: Secondary | ICD-10-CM | POA: Diagnosis not present

## 2012-11-17 DIAGNOSIS — N186 End stage renal disease: Secondary | ICD-10-CM | POA: Diagnosis not present

## 2012-11-17 DIAGNOSIS — D509 Iron deficiency anemia, unspecified: Secondary | ICD-10-CM | POA: Diagnosis not present

## 2012-11-18 DIAGNOSIS — N2581 Secondary hyperparathyroidism of renal origin: Secondary | ICD-10-CM | POA: Diagnosis not present

## 2012-11-18 DIAGNOSIS — D509 Iron deficiency anemia, unspecified: Secondary | ICD-10-CM | POA: Diagnosis not present

## 2012-11-18 DIAGNOSIS — N186 End stage renal disease: Secondary | ICD-10-CM | POA: Diagnosis not present

## 2012-11-19 DIAGNOSIS — N186 End stage renal disease: Secondary | ICD-10-CM | POA: Diagnosis not present

## 2012-11-19 DIAGNOSIS — D509 Iron deficiency anemia, unspecified: Secondary | ICD-10-CM | POA: Diagnosis not present

## 2012-11-19 DIAGNOSIS — N2581 Secondary hyperparathyroidism of renal origin: Secondary | ICD-10-CM | POA: Diagnosis not present

## 2012-11-21 DIAGNOSIS — N186 End stage renal disease: Secondary | ICD-10-CM | POA: Diagnosis not present

## 2012-11-21 DIAGNOSIS — N2581 Secondary hyperparathyroidism of renal origin: Secondary | ICD-10-CM | POA: Diagnosis not present

## 2012-11-21 DIAGNOSIS — D509 Iron deficiency anemia, unspecified: Secondary | ICD-10-CM | POA: Diagnosis not present

## 2012-11-22 DIAGNOSIS — N186 End stage renal disease: Secondary | ICD-10-CM | POA: Diagnosis not present

## 2012-11-22 DIAGNOSIS — D509 Iron deficiency anemia, unspecified: Secondary | ICD-10-CM | POA: Diagnosis not present

## 2012-11-22 DIAGNOSIS — N2581 Secondary hyperparathyroidism of renal origin: Secondary | ICD-10-CM | POA: Diagnosis not present

## 2012-11-24 DIAGNOSIS — N2581 Secondary hyperparathyroidism of renal origin: Secondary | ICD-10-CM | POA: Diagnosis not present

## 2012-11-24 DIAGNOSIS — N186 End stage renal disease: Secondary | ICD-10-CM | POA: Diagnosis not present

## 2012-11-24 DIAGNOSIS — D509 Iron deficiency anemia, unspecified: Secondary | ICD-10-CM | POA: Diagnosis not present

## 2012-11-25 DIAGNOSIS — N186 End stage renal disease: Secondary | ICD-10-CM | POA: Diagnosis not present

## 2012-11-25 DIAGNOSIS — N2581 Secondary hyperparathyroidism of renal origin: Secondary | ICD-10-CM | POA: Diagnosis not present

## 2012-11-25 DIAGNOSIS — D509 Iron deficiency anemia, unspecified: Secondary | ICD-10-CM | POA: Diagnosis not present

## 2012-11-26 DIAGNOSIS — N186 End stage renal disease: Secondary | ICD-10-CM | POA: Diagnosis not present

## 2012-11-26 DIAGNOSIS — D509 Iron deficiency anemia, unspecified: Secondary | ICD-10-CM | POA: Diagnosis not present

## 2012-11-28 DIAGNOSIS — D509 Iron deficiency anemia, unspecified: Secondary | ICD-10-CM | POA: Diagnosis not present

## 2012-11-28 DIAGNOSIS — N186 End stage renal disease: Secondary | ICD-10-CM | POA: Diagnosis not present

## 2012-11-29 DIAGNOSIS — N186 End stage renal disease: Secondary | ICD-10-CM | POA: Diagnosis not present

## 2012-11-29 DIAGNOSIS — D509 Iron deficiency anemia, unspecified: Secondary | ICD-10-CM | POA: Diagnosis not present

## 2012-12-01 DIAGNOSIS — D509 Iron deficiency anemia, unspecified: Secondary | ICD-10-CM | POA: Diagnosis not present

## 2012-12-01 DIAGNOSIS — N186 End stage renal disease: Secondary | ICD-10-CM | POA: Diagnosis not present

## 2012-12-02 DIAGNOSIS — N186 End stage renal disease: Secondary | ICD-10-CM | POA: Diagnosis not present

## 2012-12-02 DIAGNOSIS — D509 Iron deficiency anemia, unspecified: Secondary | ICD-10-CM | POA: Diagnosis not present

## 2012-12-03 DIAGNOSIS — N186 End stage renal disease: Secondary | ICD-10-CM | POA: Diagnosis not present

## 2012-12-03 DIAGNOSIS — D509 Iron deficiency anemia, unspecified: Secondary | ICD-10-CM | POA: Diagnosis not present

## 2012-12-05 DIAGNOSIS — D509 Iron deficiency anemia, unspecified: Secondary | ICD-10-CM | POA: Diagnosis not present

## 2012-12-05 DIAGNOSIS — N186 End stage renal disease: Secondary | ICD-10-CM | POA: Diagnosis not present

## 2012-12-06 DIAGNOSIS — N186 End stage renal disease: Secondary | ICD-10-CM | POA: Diagnosis not present

## 2012-12-06 DIAGNOSIS — D509 Iron deficiency anemia, unspecified: Secondary | ICD-10-CM | POA: Diagnosis not present

## 2012-12-07 ENCOUNTER — Telehealth: Payer: Self-pay | Admitting: *Deleted

## 2012-12-07 DIAGNOSIS — N959 Unspecified menopausal and perimenopausal disorder: Secondary | ICD-10-CM

## 2012-12-07 MED ORDER — ESTROGENS CONJUGATED 0.3 MG PO TABS: ORAL_TABLET | ORAL | Status: DC

## 2012-12-07 NOTE — Telephone Encounter (Signed)
Sharon [name] left a voicemail stating they need clarification on patient prescription for Premarin. Called office back and spoke to Precious, information was given for Premarin 0.3 mg once a day  #90 with 0 refills. The program does not allow refills on medications, they will contact the office when patient is in need of more medications.

## 2012-12-08 ENCOUNTER — Other Ambulatory Visit: Payer: Self-pay | Admitting: *Deleted

## 2012-12-08 DIAGNOSIS — F329 Major depressive disorder, single episode, unspecified: Secondary | ICD-10-CM

## 2012-12-08 DIAGNOSIS — D509 Iron deficiency anemia, unspecified: Secondary | ICD-10-CM | POA: Diagnosis not present

## 2012-12-08 DIAGNOSIS — N186 End stage renal disease: Secondary | ICD-10-CM | POA: Diagnosis not present

## 2012-12-08 DIAGNOSIS — N959 Unspecified menopausal and perimenopausal disorder: Secondary | ICD-10-CM

## 2012-12-08 DIAGNOSIS — I10 Essential (primary) hypertension: Secondary | ICD-10-CM

## 2012-12-08 MED ORDER — BUPROPION HCL ER (SR) 150 MG PO TB12: 150.0000 mg | ORAL_TABLET | Freq: Two times a day (BID) | ORAL | Status: DC

## 2012-12-08 MED ORDER — DIALYVITE 3000 3 MG PO TABS: 1.0000 | ORAL_TABLET | Freq: Every day | ORAL | Status: DC

## 2012-12-08 MED ORDER — METOPROLOL SUCCINATE ER 25 MG PO TB24: 25.0000 mg | ORAL_TABLET | Freq: Every day | ORAL | Status: DC

## 2012-12-08 MED ORDER — ESTROGENS CONJUGATED 0.3 MG PO TABS: ORAL_TABLET | ORAL | Status: DC

## 2012-12-08 NOTE — Telephone Encounter (Signed)
Refill Request  Premarin 0.3 mg tab  #30   Take one by mouth every day    Dialyvite   Take one by mouth ever day

## 2012-12-08 NOTE — Telephone Encounter (Signed)
Prescriptions e-scribed.

## 2012-12-09 DIAGNOSIS — D509 Iron deficiency anemia, unspecified: Secondary | ICD-10-CM | POA: Diagnosis not present

## 2012-12-09 DIAGNOSIS — N186 End stage renal disease: Secondary | ICD-10-CM | POA: Diagnosis not present

## 2012-12-10 DIAGNOSIS — N186 End stage renal disease: Secondary | ICD-10-CM | POA: Diagnosis not present

## 2012-12-10 DIAGNOSIS — D509 Iron deficiency anemia, unspecified: Secondary | ICD-10-CM | POA: Diagnosis not present

## 2012-12-12 DIAGNOSIS — N186 End stage renal disease: Secondary | ICD-10-CM | POA: Diagnosis not present

## 2012-12-12 DIAGNOSIS — D509 Iron deficiency anemia, unspecified: Secondary | ICD-10-CM | POA: Diagnosis not present

## 2012-12-13 DIAGNOSIS — N186 End stage renal disease: Secondary | ICD-10-CM | POA: Diagnosis not present

## 2012-12-13 DIAGNOSIS — D509 Iron deficiency anemia, unspecified: Secondary | ICD-10-CM | POA: Diagnosis not present

## 2012-12-15 DIAGNOSIS — N186 End stage renal disease: Secondary | ICD-10-CM | POA: Diagnosis not present

## 2012-12-15 DIAGNOSIS — D509 Iron deficiency anemia, unspecified: Secondary | ICD-10-CM | POA: Diagnosis not present

## 2012-12-16 DIAGNOSIS — N186 End stage renal disease: Secondary | ICD-10-CM | POA: Diagnosis not present

## 2012-12-16 DIAGNOSIS — D509 Iron deficiency anemia, unspecified: Secondary | ICD-10-CM | POA: Diagnosis not present

## 2012-12-17 DIAGNOSIS — D509 Iron deficiency anemia, unspecified: Secondary | ICD-10-CM | POA: Diagnosis not present

## 2012-12-17 DIAGNOSIS — N186 End stage renal disease: Secondary | ICD-10-CM | POA: Diagnosis not present

## 2012-12-19 DIAGNOSIS — N186 End stage renal disease: Secondary | ICD-10-CM | POA: Diagnosis not present

## 2012-12-19 DIAGNOSIS — D509 Iron deficiency anemia, unspecified: Secondary | ICD-10-CM | POA: Diagnosis not present

## 2012-12-20 DIAGNOSIS — D509 Iron deficiency anemia, unspecified: Secondary | ICD-10-CM | POA: Diagnosis not present

## 2012-12-20 DIAGNOSIS — N186 End stage renal disease: Secondary | ICD-10-CM | POA: Diagnosis not present

## 2012-12-22 DIAGNOSIS — N186 End stage renal disease: Secondary | ICD-10-CM | POA: Diagnosis not present

## 2012-12-22 DIAGNOSIS — D509 Iron deficiency anemia, unspecified: Secondary | ICD-10-CM | POA: Diagnosis not present

## 2012-12-23 DIAGNOSIS — D509 Iron deficiency anemia, unspecified: Secondary | ICD-10-CM | POA: Diagnosis not present

## 2012-12-23 DIAGNOSIS — N186 End stage renal disease: Secondary | ICD-10-CM | POA: Diagnosis not present

## 2012-12-24 DIAGNOSIS — N186 End stage renal disease: Secondary | ICD-10-CM | POA: Diagnosis not present

## 2012-12-24 DIAGNOSIS — D509 Iron deficiency anemia, unspecified: Secondary | ICD-10-CM | POA: Diagnosis not present

## 2012-12-26 ENCOUNTER — Telehealth: Payer: Self-pay | Admitting: *Deleted

## 2012-12-26 DIAGNOSIS — D509 Iron deficiency anemia, unspecified: Secondary | ICD-10-CM | POA: Diagnosis not present

## 2012-12-26 DIAGNOSIS — N959 Unspecified menopausal and perimenopausal disorder: Secondary | ICD-10-CM

## 2012-12-26 DIAGNOSIS — N186 End stage renal disease: Secondary | ICD-10-CM | POA: Diagnosis not present

## 2012-12-26 DIAGNOSIS — Z992 Dependence on renal dialysis: Secondary | ICD-10-CM | POA: Diagnosis not present

## 2012-12-26 DIAGNOSIS — I10 Essential (primary) hypertension: Secondary | ICD-10-CM

## 2012-12-26 NOTE — Telephone Encounter (Signed)
I reviewed her last note from her nephrologist and they are aware she is taking this medication. I would defer to them on this. She is on peritoneal dialysis and they may want her on this medication.

## 2012-12-26 NOTE — Telephone Encounter (Signed)
Message on my voicemail stating need for clarification on patient medication. Please refer to reference # [id number]. Spoke with Dionne, the pharmacist and verified patient is taking Dialyvite 3 mg once a day with #30 with 5 refills. Also she stated they do not recommend their renal patients should take Triamterene. Would like to know if you still would like patient to continue taking this medication?

## 2012-12-27 DIAGNOSIS — N186 End stage renal disease: Secondary | ICD-10-CM | POA: Diagnosis not present

## 2012-12-27 DIAGNOSIS — Z992 Dependence on renal dialysis: Secondary | ICD-10-CM | POA: Diagnosis not present

## 2012-12-27 DIAGNOSIS — D509 Iron deficiency anemia, unspecified: Secondary | ICD-10-CM | POA: Diagnosis not present

## 2012-12-27 MED ORDER — ESTROGENS CONJUGATED 0.3 MG PO TABS: ORAL_TABLET | ORAL | Status: DC

## 2012-12-27 MED ORDER — TRIAMTERENE-HCTZ 75-50 MG PO TABS: 1.0000 | ORAL_TABLET | Freq: Every day | ORAL | Status: DC

## 2012-12-27 NOTE — Telephone Encounter (Signed)
Called and spoke with Charma Igo, and she okayed for prescriptions to be sent by escript to the pharmacy. Rx sent to the pharmacy.

## 2012-12-28 ENCOUNTER — Telehealth: Payer: Self-pay | Admitting: Internal Medicine

## 2012-12-28 NOTE — Telephone Encounter (Signed)
Pt states new pharmacy states they have not received rx.  State they need metoprolol, bupropion triamterene/HCTZ authorization.

## 2012-12-28 NOTE — Telephone Encounter (Signed)
Spoke with patient informed her the prescriptions was sent yesterday. She will call them back to check on this.

## 2012-12-29 DIAGNOSIS — N186 End stage renal disease: Secondary | ICD-10-CM | POA: Diagnosis not present

## 2012-12-29 DIAGNOSIS — D509 Iron deficiency anemia, unspecified: Secondary | ICD-10-CM | POA: Diagnosis not present

## 2012-12-29 DIAGNOSIS — Z992 Dependence on renal dialysis: Secondary | ICD-10-CM | POA: Diagnosis not present

## 2012-12-30 DIAGNOSIS — D509 Iron deficiency anemia, unspecified: Secondary | ICD-10-CM | POA: Diagnosis not present

## 2012-12-30 DIAGNOSIS — N186 End stage renal disease: Secondary | ICD-10-CM | POA: Diagnosis not present

## 2012-12-30 DIAGNOSIS — Z992 Dependence on renal dialysis: Secondary | ICD-10-CM | POA: Diagnosis not present

## 2012-12-31 DIAGNOSIS — N186 End stage renal disease: Secondary | ICD-10-CM | POA: Diagnosis not present

## 2012-12-31 DIAGNOSIS — Z992 Dependence on renal dialysis: Secondary | ICD-10-CM | POA: Diagnosis not present

## 2012-12-31 DIAGNOSIS — D509 Iron deficiency anemia, unspecified: Secondary | ICD-10-CM | POA: Diagnosis not present

## 2013-01-01 DIAGNOSIS — D509 Iron deficiency anemia, unspecified: Secondary | ICD-10-CM | POA: Diagnosis not present

## 2013-01-01 DIAGNOSIS — Z992 Dependence on renal dialysis: Secondary | ICD-10-CM | POA: Diagnosis not present

## 2013-01-01 DIAGNOSIS — N186 End stage renal disease: Secondary | ICD-10-CM | POA: Diagnosis not present

## 2013-01-02 DIAGNOSIS — Z992 Dependence on renal dialysis: Secondary | ICD-10-CM | POA: Diagnosis not present

## 2013-01-02 DIAGNOSIS — D509 Iron deficiency anemia, unspecified: Secondary | ICD-10-CM | POA: Diagnosis not present

## 2013-01-02 DIAGNOSIS — N186 End stage renal disease: Secondary | ICD-10-CM | POA: Diagnosis not present

## 2013-01-03 DIAGNOSIS — D509 Iron deficiency anemia, unspecified: Secondary | ICD-10-CM | POA: Diagnosis not present

## 2013-01-03 DIAGNOSIS — N186 End stage renal disease: Secondary | ICD-10-CM | POA: Diagnosis not present

## 2013-01-03 DIAGNOSIS — Z992 Dependence on renal dialysis: Secondary | ICD-10-CM | POA: Diagnosis not present

## 2013-01-05 DIAGNOSIS — N186 End stage renal disease: Secondary | ICD-10-CM | POA: Diagnosis not present

## 2013-01-05 DIAGNOSIS — Z992 Dependence on renal dialysis: Secondary | ICD-10-CM | POA: Diagnosis not present

## 2013-01-05 DIAGNOSIS — D509 Iron deficiency anemia, unspecified: Secondary | ICD-10-CM | POA: Diagnosis not present

## 2013-01-06 DIAGNOSIS — N186 End stage renal disease: Secondary | ICD-10-CM | POA: Diagnosis not present

## 2013-01-06 DIAGNOSIS — D509 Iron deficiency anemia, unspecified: Secondary | ICD-10-CM | POA: Diagnosis not present

## 2013-01-06 DIAGNOSIS — Z992 Dependence on renal dialysis: Secondary | ICD-10-CM | POA: Diagnosis not present

## 2013-01-07 DIAGNOSIS — D509 Iron deficiency anemia, unspecified: Secondary | ICD-10-CM | POA: Diagnosis not present

## 2013-01-07 DIAGNOSIS — Z992 Dependence on renal dialysis: Secondary | ICD-10-CM | POA: Diagnosis not present

## 2013-01-07 DIAGNOSIS — N186 End stage renal disease: Secondary | ICD-10-CM | POA: Diagnosis not present

## 2013-01-08 DIAGNOSIS — D509 Iron deficiency anemia, unspecified: Secondary | ICD-10-CM | POA: Diagnosis not present

## 2013-01-08 DIAGNOSIS — Z992 Dependence on renal dialysis: Secondary | ICD-10-CM | POA: Diagnosis not present

## 2013-01-08 DIAGNOSIS — N186 End stage renal disease: Secondary | ICD-10-CM | POA: Diagnosis not present

## 2013-01-09 DIAGNOSIS — N186 End stage renal disease: Secondary | ICD-10-CM | POA: Diagnosis not present

## 2013-01-09 DIAGNOSIS — Z992 Dependence on renal dialysis: Secondary | ICD-10-CM | POA: Diagnosis not present

## 2013-01-09 DIAGNOSIS — D509 Iron deficiency anemia, unspecified: Secondary | ICD-10-CM | POA: Diagnosis not present

## 2013-01-10 DIAGNOSIS — Z992 Dependence on renal dialysis: Secondary | ICD-10-CM | POA: Diagnosis not present

## 2013-01-10 DIAGNOSIS — D509 Iron deficiency anemia, unspecified: Secondary | ICD-10-CM | POA: Diagnosis not present

## 2013-01-10 DIAGNOSIS — N186 End stage renal disease: Secondary | ICD-10-CM | POA: Diagnosis not present

## 2013-01-12 DIAGNOSIS — N186 End stage renal disease: Secondary | ICD-10-CM | POA: Diagnosis not present

## 2013-01-12 DIAGNOSIS — Z992 Dependence on renal dialysis: Secondary | ICD-10-CM | POA: Diagnosis not present

## 2013-01-12 DIAGNOSIS — D509 Iron deficiency anemia, unspecified: Secondary | ICD-10-CM | POA: Diagnosis not present

## 2013-01-13 DIAGNOSIS — D509 Iron deficiency anemia, unspecified: Secondary | ICD-10-CM | POA: Diagnosis not present

## 2013-01-13 DIAGNOSIS — Z992 Dependence on renal dialysis: Secondary | ICD-10-CM | POA: Diagnosis not present

## 2013-01-13 DIAGNOSIS — N186 End stage renal disease: Secondary | ICD-10-CM | POA: Diagnosis not present

## 2013-01-14 DIAGNOSIS — N186 End stage renal disease: Secondary | ICD-10-CM | POA: Diagnosis not present

## 2013-01-14 DIAGNOSIS — Z992 Dependence on renal dialysis: Secondary | ICD-10-CM | POA: Diagnosis not present

## 2013-01-14 DIAGNOSIS — D509 Iron deficiency anemia, unspecified: Secondary | ICD-10-CM | POA: Diagnosis not present

## 2013-01-16 DIAGNOSIS — Z992 Dependence on renal dialysis: Secondary | ICD-10-CM | POA: Diagnosis not present

## 2013-01-16 DIAGNOSIS — N186 End stage renal disease: Secondary | ICD-10-CM | POA: Diagnosis not present

## 2013-01-16 DIAGNOSIS — D509 Iron deficiency anemia, unspecified: Secondary | ICD-10-CM | POA: Diagnosis not present

## 2013-01-17 DIAGNOSIS — N186 End stage renal disease: Secondary | ICD-10-CM | POA: Diagnosis not present

## 2013-01-17 DIAGNOSIS — Z992 Dependence on renal dialysis: Secondary | ICD-10-CM | POA: Diagnosis not present

## 2013-01-17 DIAGNOSIS — D509 Iron deficiency anemia, unspecified: Secondary | ICD-10-CM | POA: Diagnosis not present

## 2013-01-19 DIAGNOSIS — Z992 Dependence on renal dialysis: Secondary | ICD-10-CM | POA: Diagnosis not present

## 2013-01-19 DIAGNOSIS — D509 Iron deficiency anemia, unspecified: Secondary | ICD-10-CM | POA: Diagnosis not present

## 2013-01-19 DIAGNOSIS — N186 End stage renal disease: Secondary | ICD-10-CM | POA: Diagnosis not present

## 2013-01-20 DIAGNOSIS — Z992 Dependence on renal dialysis: Secondary | ICD-10-CM | POA: Diagnosis not present

## 2013-01-20 DIAGNOSIS — D509 Iron deficiency anemia, unspecified: Secondary | ICD-10-CM | POA: Diagnosis not present

## 2013-01-20 DIAGNOSIS — N186 End stage renal disease: Secondary | ICD-10-CM | POA: Diagnosis not present

## 2013-01-21 DIAGNOSIS — D509 Iron deficiency anemia, unspecified: Secondary | ICD-10-CM | POA: Diagnosis not present

## 2013-01-21 DIAGNOSIS — Z992 Dependence on renal dialysis: Secondary | ICD-10-CM | POA: Diagnosis not present

## 2013-01-21 DIAGNOSIS — N186 End stage renal disease: Secondary | ICD-10-CM | POA: Diagnosis not present

## 2013-01-23 DIAGNOSIS — Z992 Dependence on renal dialysis: Secondary | ICD-10-CM | POA: Diagnosis not present

## 2013-01-23 DIAGNOSIS — N186 End stage renal disease: Secondary | ICD-10-CM | POA: Diagnosis not present

## 2013-01-23 DIAGNOSIS — D509 Iron deficiency anemia, unspecified: Secondary | ICD-10-CM | POA: Diagnosis not present

## 2013-01-24 DIAGNOSIS — Z992 Dependence on renal dialysis: Secondary | ICD-10-CM | POA: Diagnosis not present

## 2013-01-24 DIAGNOSIS — N186 End stage renal disease: Secondary | ICD-10-CM | POA: Diagnosis not present

## 2013-01-24 DIAGNOSIS — D509 Iron deficiency anemia, unspecified: Secondary | ICD-10-CM | POA: Diagnosis not present

## 2013-01-26 DIAGNOSIS — N2581 Secondary hyperparathyroidism of renal origin: Secondary | ICD-10-CM | POA: Diagnosis not present

## 2013-01-26 DIAGNOSIS — N186 End stage renal disease: Secondary | ICD-10-CM | POA: Diagnosis not present

## 2013-01-26 DIAGNOSIS — E559 Vitamin D deficiency, unspecified: Secondary | ICD-10-CM | POA: Diagnosis not present

## 2013-01-26 DIAGNOSIS — D509 Iron deficiency anemia, unspecified: Secondary | ICD-10-CM | POA: Diagnosis not present

## 2013-02-26 DIAGNOSIS — N186 End stage renal disease: Secondary | ICD-10-CM | POA: Diagnosis not present

## 2013-02-27 DIAGNOSIS — N186 End stage renal disease: Secondary | ICD-10-CM | POA: Diagnosis not present

## 2013-02-27 DIAGNOSIS — D509 Iron deficiency anemia, unspecified: Secondary | ICD-10-CM | POA: Diagnosis not present

## 2013-02-28 DIAGNOSIS — N186 End stage renal disease: Secondary | ICD-10-CM | POA: Diagnosis not present

## 2013-02-28 DIAGNOSIS — D509 Iron deficiency anemia, unspecified: Secondary | ICD-10-CM | POA: Diagnosis not present

## 2013-03-02 DIAGNOSIS — D509 Iron deficiency anemia, unspecified: Secondary | ICD-10-CM | POA: Diagnosis not present

## 2013-03-02 DIAGNOSIS — N186 End stage renal disease: Secondary | ICD-10-CM | POA: Diagnosis not present

## 2013-03-03 DIAGNOSIS — N186 End stage renal disease: Secondary | ICD-10-CM | POA: Diagnosis not present

## 2013-03-03 DIAGNOSIS — D509 Iron deficiency anemia, unspecified: Secondary | ICD-10-CM | POA: Diagnosis not present

## 2013-03-04 DIAGNOSIS — N186 End stage renal disease: Secondary | ICD-10-CM | POA: Diagnosis not present

## 2013-03-04 DIAGNOSIS — D509 Iron deficiency anemia, unspecified: Secondary | ICD-10-CM | POA: Diagnosis not present

## 2013-03-05 DIAGNOSIS — D509 Iron deficiency anemia, unspecified: Secondary | ICD-10-CM | POA: Diagnosis not present

## 2013-03-05 DIAGNOSIS — N186 End stage renal disease: Secondary | ICD-10-CM | POA: Diagnosis not present

## 2013-03-06 DIAGNOSIS — N186 End stage renal disease: Secondary | ICD-10-CM | POA: Diagnosis not present

## 2013-03-06 DIAGNOSIS — D509 Iron deficiency anemia, unspecified: Secondary | ICD-10-CM | POA: Diagnosis not present

## 2013-03-07 DIAGNOSIS — N186 End stage renal disease: Secondary | ICD-10-CM | POA: Diagnosis not present

## 2013-03-07 DIAGNOSIS — D509 Iron deficiency anemia, unspecified: Secondary | ICD-10-CM | POA: Diagnosis not present

## 2013-03-09 DIAGNOSIS — N186 End stage renal disease: Secondary | ICD-10-CM | POA: Diagnosis not present

## 2013-03-09 DIAGNOSIS — D509 Iron deficiency anemia, unspecified: Secondary | ICD-10-CM | POA: Diagnosis not present

## 2013-03-10 DIAGNOSIS — D509 Iron deficiency anemia, unspecified: Secondary | ICD-10-CM | POA: Diagnosis not present

## 2013-03-10 DIAGNOSIS — N186 End stage renal disease: Secondary | ICD-10-CM | POA: Diagnosis not present

## 2013-03-11 DIAGNOSIS — D509 Iron deficiency anemia, unspecified: Secondary | ICD-10-CM | POA: Diagnosis not present

## 2013-03-11 DIAGNOSIS — N186 End stage renal disease: Secondary | ICD-10-CM | POA: Diagnosis not present

## 2013-03-13 DIAGNOSIS — N186 End stage renal disease: Secondary | ICD-10-CM | POA: Diagnosis not present

## 2013-03-13 DIAGNOSIS — D509 Iron deficiency anemia, unspecified: Secondary | ICD-10-CM | POA: Diagnosis not present

## 2013-03-14 DIAGNOSIS — D509 Iron deficiency anemia, unspecified: Secondary | ICD-10-CM | POA: Diagnosis not present

## 2013-03-14 DIAGNOSIS — N186 End stage renal disease: Secondary | ICD-10-CM | POA: Diagnosis not present

## 2013-03-16 DIAGNOSIS — N186 End stage renal disease: Secondary | ICD-10-CM | POA: Diagnosis not present

## 2013-03-16 DIAGNOSIS — D509 Iron deficiency anemia, unspecified: Secondary | ICD-10-CM | POA: Diagnosis not present

## 2013-03-17 DIAGNOSIS — N186 End stage renal disease: Secondary | ICD-10-CM | POA: Diagnosis not present

## 2013-03-17 DIAGNOSIS — D509 Iron deficiency anemia, unspecified: Secondary | ICD-10-CM | POA: Diagnosis not present

## 2013-03-18 DIAGNOSIS — N186 End stage renal disease: Secondary | ICD-10-CM | POA: Diagnosis not present

## 2013-03-18 DIAGNOSIS — D509 Iron deficiency anemia, unspecified: Secondary | ICD-10-CM | POA: Diagnosis not present

## 2013-03-20 DIAGNOSIS — N186 End stage renal disease: Secondary | ICD-10-CM | POA: Diagnosis not present

## 2013-03-20 DIAGNOSIS — D509 Iron deficiency anemia, unspecified: Secondary | ICD-10-CM | POA: Diagnosis not present

## 2013-03-21 DIAGNOSIS — D509 Iron deficiency anemia, unspecified: Secondary | ICD-10-CM | POA: Diagnosis not present

## 2013-03-21 DIAGNOSIS — N186 End stage renal disease: Secondary | ICD-10-CM | POA: Diagnosis not present

## 2013-03-23 DIAGNOSIS — D509 Iron deficiency anemia, unspecified: Secondary | ICD-10-CM | POA: Diagnosis not present

## 2013-03-23 DIAGNOSIS — N186 End stage renal disease: Secondary | ICD-10-CM | POA: Diagnosis not present

## 2013-03-24 DIAGNOSIS — D509 Iron deficiency anemia, unspecified: Secondary | ICD-10-CM | POA: Diagnosis not present

## 2013-03-24 DIAGNOSIS — N186 End stage renal disease: Secondary | ICD-10-CM | POA: Diagnosis not present

## 2013-03-25 DIAGNOSIS — D509 Iron deficiency anemia, unspecified: Secondary | ICD-10-CM | POA: Diagnosis not present

## 2013-03-25 DIAGNOSIS — N186 End stage renal disease: Secondary | ICD-10-CM | POA: Diagnosis not present

## 2013-03-26 DIAGNOSIS — N186 End stage renal disease: Secondary | ICD-10-CM | POA: Diagnosis not present

## 2013-03-27 DIAGNOSIS — D509 Iron deficiency anemia, unspecified: Secondary | ICD-10-CM | POA: Diagnosis not present

## 2013-03-27 DIAGNOSIS — N186 End stage renal disease: Secondary | ICD-10-CM | POA: Diagnosis not present

## 2013-03-27 DIAGNOSIS — Z992 Dependence on renal dialysis: Secondary | ICD-10-CM | POA: Diagnosis not present

## 2013-03-27 DIAGNOSIS — Z79899 Other long term (current) drug therapy: Secondary | ICD-10-CM | POA: Diagnosis not present

## 2013-03-28 DIAGNOSIS — Z79899 Other long term (current) drug therapy: Secondary | ICD-10-CM | POA: Diagnosis not present

## 2013-03-28 DIAGNOSIS — N186 End stage renal disease: Secondary | ICD-10-CM | POA: Diagnosis not present

## 2013-03-28 DIAGNOSIS — Z992 Dependence on renal dialysis: Secondary | ICD-10-CM | POA: Diagnosis not present

## 2013-03-28 DIAGNOSIS — D509 Iron deficiency anemia, unspecified: Secondary | ICD-10-CM | POA: Diagnosis not present

## 2013-03-29 ENCOUNTER — Telehealth: Payer: Self-pay | Admitting: *Deleted

## 2013-03-29 NOTE — Telephone Encounter (Signed)
Patient called and stated she received a letter for her prescription for her Premarin to be shipped to our office. She stated this was done for her last time. Informed patient bring the letter and information in so we can see what she is talking about. I could not understand exactly what she was talking about. Patient will bring information to the office.

## 2013-03-30 DIAGNOSIS — Z992 Dependence on renal dialysis: Secondary | ICD-10-CM | POA: Diagnosis not present

## 2013-03-30 DIAGNOSIS — N186 End stage renal disease: Secondary | ICD-10-CM | POA: Diagnosis not present

## 2013-03-30 DIAGNOSIS — Z79899 Other long term (current) drug therapy: Secondary | ICD-10-CM | POA: Diagnosis not present

## 2013-03-30 DIAGNOSIS — D509 Iron deficiency anemia, unspecified: Secondary | ICD-10-CM | POA: Diagnosis not present

## 2013-03-31 DIAGNOSIS — Z79899 Other long term (current) drug therapy: Secondary | ICD-10-CM | POA: Diagnosis not present

## 2013-03-31 DIAGNOSIS — N186 End stage renal disease: Secondary | ICD-10-CM | POA: Diagnosis not present

## 2013-03-31 DIAGNOSIS — Z992 Dependence on renal dialysis: Secondary | ICD-10-CM | POA: Diagnosis not present

## 2013-03-31 DIAGNOSIS — D509 Iron deficiency anemia, unspecified: Secondary | ICD-10-CM | POA: Diagnosis not present

## 2013-04-01 DIAGNOSIS — D509 Iron deficiency anemia, unspecified: Secondary | ICD-10-CM | POA: Diagnosis not present

## 2013-04-01 DIAGNOSIS — Z992 Dependence on renal dialysis: Secondary | ICD-10-CM | POA: Diagnosis not present

## 2013-04-01 DIAGNOSIS — N186 End stage renal disease: Secondary | ICD-10-CM | POA: Diagnosis not present

## 2013-04-01 DIAGNOSIS — Z79899 Other long term (current) drug therapy: Secondary | ICD-10-CM | POA: Diagnosis not present

## 2013-04-03 DIAGNOSIS — Z79899 Other long term (current) drug therapy: Secondary | ICD-10-CM | POA: Diagnosis not present

## 2013-04-03 DIAGNOSIS — N186 End stage renal disease: Secondary | ICD-10-CM | POA: Diagnosis not present

## 2013-04-03 DIAGNOSIS — Z992 Dependence on renal dialysis: Secondary | ICD-10-CM | POA: Diagnosis not present

## 2013-04-03 DIAGNOSIS — D509 Iron deficiency anemia, unspecified: Secondary | ICD-10-CM | POA: Diagnosis not present

## 2013-04-04 DIAGNOSIS — D509 Iron deficiency anemia, unspecified: Secondary | ICD-10-CM | POA: Diagnosis not present

## 2013-04-04 DIAGNOSIS — N186 End stage renal disease: Secondary | ICD-10-CM | POA: Diagnosis not present

## 2013-04-04 DIAGNOSIS — Z79899 Other long term (current) drug therapy: Secondary | ICD-10-CM | POA: Diagnosis not present

## 2013-04-04 DIAGNOSIS — Z992 Dependence on renal dialysis: Secondary | ICD-10-CM | POA: Diagnosis not present

## 2013-04-05 ENCOUNTER — Telehealth: Payer: Self-pay | Admitting: *Deleted

## 2013-04-05 NOTE — Telephone Encounter (Signed)
Please call patient and ask her why her enrollment has expired.

## 2013-04-05 NOTE — Telephone Encounter (Signed)
[place]nard for prior authorization on the Premarin, but she is currently not enrolled anymore  Enrollment expiration date was 12.31.2014

## 2013-04-05 NOTE — Telephone Encounter (Signed)
Please advise what needs to be done in reference to below:

## 2013-04-06 DIAGNOSIS — Z992 Dependence on renal dialysis: Secondary | ICD-10-CM | POA: Diagnosis not present

## 2013-04-06 DIAGNOSIS — D509 Iron deficiency anemia, unspecified: Secondary | ICD-10-CM | POA: Diagnosis not present

## 2013-04-06 DIAGNOSIS — Z79899 Other long term (current) drug therapy: Secondary | ICD-10-CM | POA: Diagnosis not present

## 2013-04-06 DIAGNOSIS — N186 End stage renal disease: Secondary | ICD-10-CM | POA: Diagnosis not present

## 2013-04-06 NOTE — Telephone Encounter (Signed)
Called and spoke with patient, she is was not aware this had expired. Assumed all she was to do, was have our office complete the refill request so they will ship medication. Informed patient her enrollment has expired, give Ranier a call then call the office back to let us know what they stated then we will know what the next step will be.

## 2013-04-07 DIAGNOSIS — Z79899 Other long term (current) drug therapy: Secondary | ICD-10-CM | POA: Diagnosis not present

## 2013-04-07 DIAGNOSIS — Z992 Dependence on renal dialysis: Secondary | ICD-10-CM | POA: Diagnosis not present

## 2013-04-07 DIAGNOSIS — N186 End stage renal disease: Secondary | ICD-10-CM | POA: Diagnosis not present

## 2013-04-07 DIAGNOSIS — D509 Iron deficiency anemia, unspecified: Secondary | ICD-10-CM | POA: Diagnosis not present

## 2013-04-08 DIAGNOSIS — Z79899 Other long term (current) drug therapy: Secondary | ICD-10-CM | POA: Diagnosis not present

## 2013-04-08 DIAGNOSIS — D509 Iron deficiency anemia, unspecified: Secondary | ICD-10-CM | POA: Diagnosis not present

## 2013-04-08 DIAGNOSIS — Z992 Dependence on renal dialysis: Secondary | ICD-10-CM | POA: Diagnosis not present

## 2013-04-08 DIAGNOSIS — N186 End stage renal disease: Secondary | ICD-10-CM | POA: Diagnosis not present

## 2013-04-10 DIAGNOSIS — Z79899 Other long term (current) drug therapy: Secondary | ICD-10-CM | POA: Diagnosis not present

## 2013-04-10 DIAGNOSIS — D509 Iron deficiency anemia, unspecified: Secondary | ICD-10-CM | POA: Diagnosis not present

## 2013-04-10 DIAGNOSIS — N186 End stage renal disease: Secondary | ICD-10-CM | POA: Diagnosis not present

## 2013-04-10 DIAGNOSIS — Z992 Dependence on renal dialysis: Secondary | ICD-10-CM | POA: Diagnosis not present

## 2013-04-11 DIAGNOSIS — Z79899 Other long term (current) drug therapy: Secondary | ICD-10-CM | POA: Diagnosis not present

## 2013-04-11 DIAGNOSIS — D509 Iron deficiency anemia, unspecified: Secondary | ICD-10-CM | POA: Diagnosis not present

## 2013-04-11 DIAGNOSIS — N186 End stage renal disease: Secondary | ICD-10-CM | POA: Diagnosis not present

## 2013-04-11 DIAGNOSIS — Z992 Dependence on renal dialysis: Secondary | ICD-10-CM | POA: Diagnosis not present

## 2013-04-13 DIAGNOSIS — N186 End stage renal disease: Secondary | ICD-10-CM | POA: Diagnosis not present

## 2013-04-13 DIAGNOSIS — Z79899 Other long term (current) drug therapy: Secondary | ICD-10-CM | POA: Diagnosis not present

## 2013-04-13 DIAGNOSIS — Z992 Dependence on renal dialysis: Secondary | ICD-10-CM | POA: Diagnosis not present

## 2013-04-13 DIAGNOSIS — D509 Iron deficiency anemia, unspecified: Secondary | ICD-10-CM | POA: Diagnosis not present

## 2013-04-14 DIAGNOSIS — D509 Iron deficiency anemia, unspecified: Secondary | ICD-10-CM | POA: Diagnosis not present

## 2013-04-14 DIAGNOSIS — N186 End stage renal disease: Secondary | ICD-10-CM | POA: Diagnosis not present

## 2013-04-14 DIAGNOSIS — Z992 Dependence on renal dialysis: Secondary | ICD-10-CM | POA: Diagnosis not present

## 2013-04-14 DIAGNOSIS — Z79899 Other long term (current) drug therapy: Secondary | ICD-10-CM | POA: Diagnosis not present

## 2013-04-15 DIAGNOSIS — D509 Iron deficiency anemia, unspecified: Secondary | ICD-10-CM | POA: Diagnosis not present

## 2013-04-15 DIAGNOSIS — N186 End stage renal disease: Secondary | ICD-10-CM | POA: Diagnosis not present

## 2013-04-15 DIAGNOSIS — Z992 Dependence on renal dialysis: Secondary | ICD-10-CM | POA: Diagnosis not present

## 2013-04-15 DIAGNOSIS — Z79899 Other long term (current) drug therapy: Secondary | ICD-10-CM | POA: Diagnosis not present

## 2013-04-17 DIAGNOSIS — N186 End stage renal disease: Secondary | ICD-10-CM | POA: Diagnosis not present

## 2013-04-17 DIAGNOSIS — Z79899 Other long term (current) drug therapy: Secondary | ICD-10-CM | POA: Diagnosis not present

## 2013-04-17 DIAGNOSIS — Z992 Dependence on renal dialysis: Secondary | ICD-10-CM | POA: Diagnosis not present

## 2013-04-17 DIAGNOSIS — D509 Iron deficiency anemia, unspecified: Secondary | ICD-10-CM | POA: Diagnosis not present

## 2013-04-18 DIAGNOSIS — N186 End stage renal disease: Secondary | ICD-10-CM | POA: Diagnosis not present

## 2013-04-18 DIAGNOSIS — Z992 Dependence on renal dialysis: Secondary | ICD-10-CM | POA: Diagnosis not present

## 2013-04-18 DIAGNOSIS — D509 Iron deficiency anemia, unspecified: Secondary | ICD-10-CM | POA: Diagnosis not present

## 2013-04-18 DIAGNOSIS — Z79899 Other long term (current) drug therapy: Secondary | ICD-10-CM | POA: Diagnosis not present

## 2013-04-20 DIAGNOSIS — N186 End stage renal disease: Secondary | ICD-10-CM | POA: Diagnosis not present

## 2013-04-20 DIAGNOSIS — D509 Iron deficiency anemia, unspecified: Secondary | ICD-10-CM | POA: Diagnosis not present

## 2013-04-20 DIAGNOSIS — Z992 Dependence on renal dialysis: Secondary | ICD-10-CM | POA: Diagnosis not present

## 2013-04-20 DIAGNOSIS — Z79899 Other long term (current) drug therapy: Secondary | ICD-10-CM | POA: Diagnosis not present

## 2013-04-21 DIAGNOSIS — Z950 Presence of cardiac pacemaker: Secondary | ICD-10-CM | POA: Diagnosis not present

## 2013-04-21 DIAGNOSIS — I10 Essential (primary) hypertension: Secondary | ICD-10-CM | POA: Diagnosis not present

## 2013-04-21 DIAGNOSIS — Z79899 Other long term (current) drug therapy: Secondary | ICD-10-CM | POA: Diagnosis not present

## 2013-04-21 DIAGNOSIS — D509 Iron deficiency anemia, unspecified: Secondary | ICD-10-CM | POA: Diagnosis not present

## 2013-04-21 DIAGNOSIS — Z992 Dependence on renal dialysis: Secondary | ICD-10-CM | POA: Diagnosis not present

## 2013-04-21 DIAGNOSIS — N186 End stage renal disease: Secondary | ICD-10-CM | POA: Diagnosis not present

## 2013-04-22 DIAGNOSIS — D509 Iron deficiency anemia, unspecified: Secondary | ICD-10-CM | POA: Diagnosis not present

## 2013-04-22 DIAGNOSIS — Z79899 Other long term (current) drug therapy: Secondary | ICD-10-CM | POA: Diagnosis not present

## 2013-04-22 DIAGNOSIS — Z992 Dependence on renal dialysis: Secondary | ICD-10-CM | POA: Diagnosis not present

## 2013-04-22 DIAGNOSIS — N186 End stage renal disease: Secondary | ICD-10-CM | POA: Diagnosis not present

## 2013-04-24 ENCOUNTER — Ambulatory Visit: Payer: Self-pay | Admitting: Cardiology

## 2013-04-24 DIAGNOSIS — D509 Iron deficiency anemia, unspecified: Secondary | ICD-10-CM | POA: Diagnosis not present

## 2013-04-24 DIAGNOSIS — Z01818 Encounter for other preprocedural examination: Secondary | ICD-10-CM | POA: Diagnosis not present

## 2013-04-24 DIAGNOSIS — R55 Syncope and collapse: Secondary | ICD-10-CM | POA: Diagnosis not present

## 2013-04-24 DIAGNOSIS — Z01812 Encounter for preprocedural laboratory examination: Secondary | ICD-10-CM | POA: Diagnosis not present

## 2013-04-24 DIAGNOSIS — N186 End stage renal disease: Secondary | ICD-10-CM | POA: Diagnosis not present

## 2013-04-24 DIAGNOSIS — I495 Sick sinus syndrome: Secondary | ICD-10-CM | POA: Diagnosis not present

## 2013-04-24 DIAGNOSIS — Z79899 Other long term (current) drug therapy: Secondary | ICD-10-CM | POA: Diagnosis not present

## 2013-04-24 DIAGNOSIS — Z01810 Encounter for preprocedural cardiovascular examination: Secondary | ICD-10-CM | POA: Diagnosis not present

## 2013-04-24 DIAGNOSIS — I498 Other specified cardiac arrhythmias: Secondary | ICD-10-CM | POA: Diagnosis not present

## 2013-04-24 DIAGNOSIS — Z992 Dependence on renal dialysis: Secondary | ICD-10-CM | POA: Diagnosis not present

## 2013-04-24 DIAGNOSIS — I10 Essential (primary) hypertension: Secondary | ICD-10-CM | POA: Diagnosis not present

## 2013-04-24 LAB — BASIC METABOLIC PANEL
Anion Gap: 4 — ABNORMAL LOW (ref 7–16)
BUN: 30 mg/dL — ABNORMAL HIGH (ref 7–18)
Calcium, Total: 8.7 mg/dL (ref 8.5–10.1)
Chloride: 106 mmol/L (ref 98–107)
Co2: 32 mmol/L (ref 21–32)
Creatinine: 2.68 mg/dL — ABNORMAL HIGH (ref 0.60–1.30)
EGFR (Non-African Amer.): 16 — ABNORMAL LOW
GFR CALC AF AMER: 18 — AB
GLUCOSE: 109 mg/dL — AB (ref 65–99)
Osmolality: 290 (ref 275–301)
POTASSIUM: 3.8 mmol/L (ref 3.5–5.1)
Sodium: 142 mmol/L (ref 136–145)

## 2013-04-24 LAB — PROTIME-INR
INR: 1.1
PROTHROMBIN TIME: 13.6 s (ref 11.5–14.7)

## 2013-04-24 LAB — CBC WITH DIFFERENTIAL/PLATELET
Basophil #: 0 10*3/uL (ref 0.0–0.1)
Basophil %: 0.5 %
EOS ABS: 0.2 10*3/uL (ref 0.0–0.7)
EOS PCT: 3.1 %
HCT: 35.6 % (ref 35.0–47.0)
HGB: 12 g/dL (ref 12.0–16.0)
LYMPHS PCT: 25.3 %
Lymphocyte #: 1.5 10*3/uL (ref 1.0–3.6)
MCH: 31.9 pg (ref 26.0–34.0)
MCHC: 33.9 g/dL (ref 32.0–36.0)
MCV: 94 fL (ref 80–100)
MONO ABS: 0.5 x10 3/mm (ref 0.2–0.9)
Monocyte %: 8.9 %
NEUTROS ABS: 3.7 10*3/uL (ref 1.4–6.5)
NEUTROS PCT: 62.2 %
Platelet: 216 10*3/uL (ref 150–440)
RBC: 3.77 10*6/uL — AB (ref 3.80–5.20)
RDW: 13.2 % (ref 11.5–14.5)
WBC: 5.9 10*3/uL (ref 3.6–11.0)

## 2013-04-24 LAB — APTT: ACTIVATED PTT: 33.9 s (ref 23.6–35.9)

## 2013-04-25 DIAGNOSIS — Z992 Dependence on renal dialysis: Secondary | ICD-10-CM | POA: Diagnosis not present

## 2013-04-25 DIAGNOSIS — D509 Iron deficiency anemia, unspecified: Secondary | ICD-10-CM | POA: Diagnosis not present

## 2013-04-25 DIAGNOSIS — N186 End stage renal disease: Secondary | ICD-10-CM | POA: Diagnosis not present

## 2013-04-25 DIAGNOSIS — Z79899 Other long term (current) drug therapy: Secondary | ICD-10-CM | POA: Diagnosis not present

## 2013-04-26 DIAGNOSIS — N186 End stage renal disease: Secondary | ICD-10-CM | POA: Diagnosis not present

## 2013-04-27 ENCOUNTER — Ambulatory Visit: Payer: Self-pay | Admitting: Cardiology

## 2013-04-27 DIAGNOSIS — Z87891 Personal history of nicotine dependence: Secondary | ICD-10-CM | POA: Diagnosis not present

## 2013-04-27 DIAGNOSIS — Z79899 Other long term (current) drug therapy: Secondary | ICD-10-CM | POA: Diagnosis not present

## 2013-04-27 DIAGNOSIS — E559 Vitamin D deficiency, unspecified: Secondary | ICD-10-CM | POA: Diagnosis not present

## 2013-04-27 DIAGNOSIS — H9190 Unspecified hearing loss, unspecified ear: Secondary | ICD-10-CM | POA: Diagnosis not present

## 2013-04-27 DIAGNOSIS — R42 Dizziness and giddiness: Secondary | ICD-10-CM | POA: Diagnosis not present

## 2013-04-27 DIAGNOSIS — I120 Hypertensive chronic kidney disease with stage 5 chronic kidney disease or end stage renal disease: Secondary | ICD-10-CM | POA: Diagnosis not present

## 2013-04-27 DIAGNOSIS — Z992 Dependence on renal dialysis: Secondary | ICD-10-CM | POA: Diagnosis not present

## 2013-04-27 DIAGNOSIS — Z885 Allergy status to narcotic agent status: Secondary | ICD-10-CM | POA: Diagnosis not present

## 2013-04-27 DIAGNOSIS — E785 Hyperlipidemia, unspecified: Secondary | ICD-10-CM | POA: Diagnosis not present

## 2013-04-27 DIAGNOSIS — R55 Syncope and collapse: Secondary | ICD-10-CM | POA: Diagnosis not present

## 2013-04-27 DIAGNOSIS — D509 Iron deficiency anemia, unspecified: Secondary | ICD-10-CM | POA: Diagnosis not present

## 2013-04-27 DIAGNOSIS — I428 Other cardiomyopathies: Secondary | ICD-10-CM | POA: Diagnosis not present

## 2013-04-27 DIAGNOSIS — N186 End stage renal disease: Secondary | ICD-10-CM | POA: Diagnosis not present

## 2013-04-27 DIAGNOSIS — Z889 Allergy status to unspecified drugs, medicaments and biological substances status: Secondary | ICD-10-CM | POA: Diagnosis not present

## 2013-04-27 DIAGNOSIS — Z888 Allergy status to other drugs, medicaments and biological substances status: Secondary | ICD-10-CM | POA: Diagnosis not present

## 2013-04-27 DIAGNOSIS — M129 Arthropathy, unspecified: Secondary | ICD-10-CM | POA: Diagnosis not present

## 2013-04-27 DIAGNOSIS — K219 Gastro-esophageal reflux disease without esophagitis: Secondary | ICD-10-CM | POA: Diagnosis not present

## 2013-04-27 DIAGNOSIS — Z45018 Encounter for adjustment and management of other part of cardiac pacemaker: Secondary | ICD-10-CM | POA: Diagnosis not present

## 2013-04-27 DIAGNOSIS — R0602 Shortness of breath: Secondary | ICD-10-CM | POA: Diagnosis not present

## 2013-04-27 DIAGNOSIS — N2581 Secondary hyperparathyroidism of renal origin: Secondary | ICD-10-CM | POA: Diagnosis not present

## 2013-04-27 DIAGNOSIS — I2510 Atherosclerotic heart disease of native coronary artery without angina pectoris: Secondary | ICD-10-CM | POA: Diagnosis not present

## 2013-04-27 DIAGNOSIS — I495 Sick sinus syndrome: Secondary | ICD-10-CM | POA: Diagnosis not present

## 2013-04-27 DIAGNOSIS — G4730 Sleep apnea, unspecified: Secondary | ICD-10-CM | POA: Diagnosis not present

## 2013-04-27 DIAGNOSIS — M549 Dorsalgia, unspecified: Secondary | ICD-10-CM | POA: Diagnosis not present

## 2013-04-27 DIAGNOSIS — I10 Essential (primary) hypertension: Secondary | ICD-10-CM | POA: Diagnosis not present

## 2013-05-04 DIAGNOSIS — E782 Mixed hyperlipidemia: Secondary | ICD-10-CM | POA: Diagnosis not present

## 2013-05-04 DIAGNOSIS — I495 Sick sinus syndrome: Secondary | ICD-10-CM | POA: Diagnosis not present

## 2013-05-04 DIAGNOSIS — I2510 Atherosclerotic heart disease of native coronary artery without angina pectoris: Secondary | ICD-10-CM | POA: Diagnosis not present

## 2013-05-04 DIAGNOSIS — N186 End stage renal disease: Secondary | ICD-10-CM | POA: Diagnosis not present

## 2013-05-11 DIAGNOSIS — I495 Sick sinus syndrome: Secondary | ICD-10-CM | POA: Diagnosis not present

## 2013-05-11 DIAGNOSIS — I519 Heart disease, unspecified: Secondary | ICD-10-CM | POA: Diagnosis not present

## 2013-05-11 DIAGNOSIS — N189 Chronic kidney disease, unspecified: Secondary | ICD-10-CM | POA: Diagnosis not present

## 2013-05-11 DIAGNOSIS — Z950 Presence of cardiac pacemaker: Secondary | ICD-10-CM | POA: Diagnosis not present

## 2013-05-11 DIAGNOSIS — R0609 Other forms of dyspnea: Secondary | ICD-10-CM | POA: Diagnosis not present

## 2013-05-11 DIAGNOSIS — R0989 Other specified symptoms and signs involving the circulatory and respiratory systems: Secondary | ICD-10-CM | POA: Diagnosis not present

## 2013-05-11 DIAGNOSIS — I5022 Chronic systolic (congestive) heart failure: Secondary | ICD-10-CM | POA: Diagnosis not present

## 2013-05-12 ENCOUNTER — Ambulatory Visit (INDEPENDENT_AMBULATORY_CARE_PROVIDER_SITE_OTHER): Payer: Medicare Other | Admitting: Internal Medicine

## 2013-05-12 ENCOUNTER — Encounter: Payer: Self-pay | Admitting: Internal Medicine

## 2013-05-12 VITALS — BP 140/78 | HR 92 | Resp 16 | Wt 130.8 lb

## 2013-05-12 DIAGNOSIS — I5022 Chronic systolic (congestive) heart failure: Secondary | ICD-10-CM

## 2013-05-12 DIAGNOSIS — Z23 Encounter for immunization: Secondary | ICD-10-CM

## 2013-05-12 DIAGNOSIS — I10 Essential (primary) hypertension: Secondary | ICD-10-CM | POA: Diagnosis not present

## 2013-05-12 DIAGNOSIS — N185 Chronic kidney disease, stage 5: Secondary | ICD-10-CM | POA: Diagnosis not present

## 2013-05-12 NOTE — Assessment & Plan Note (Signed)
BP Readings from Last 3 Encounters:  05/12/13 140/78  11/10/12 140/70  08/01/12 134/74   BP well controlled generally, however SBP slightly higher today. Pt metoprolol was decreased per cardiology given recent issues with CHF. Will request notes from cardiology.

## 2013-05-12 NOTE — Assessment & Plan Note (Signed)
Tolerating PD well. Catheter site clean on exam. No recent issues with infection. Reviewed recent labs from [facility]. Follow up 3 months and prn.

## 2013-05-12 NOTE — Progress Notes (Signed)
Pre visit review using our clinic review tool, if applicable. No additional management support is needed unless otherwise documented below in the visit note. 

## 2013-05-12 NOTE — Progress Notes (Signed)
Subjective:    Patient ID: Sharon [name], female    DOB: May 15, 1926, 80 y.o.   MRN: [id number]  HPI 80YO female presents for follow up.  Dyspnea - Followed by Dr. Nehemiah Massed. Recently had pacemaker battery replaced. Felt poorly afterward. Had pacer interrogated, reportedly working well. Had ECHO yesterday. Showed worsening heart function, EF 20% per pt. Planning to get stress test. No chest pain. Shortness of breath noted occasionally at rest and more pronounced with minimal exertion. No orthopnea or LEE.  ESRD on PD - No problems with PD. Off Triamterene per Dr. Holley Raring.     Review of Systems  Constitutional: Positive for fatigue. Negative for fever, chills, appetite change and unexpected weight change.  HENT: Negative for congestion, ear pain, sinus pressure, sore throat, trouble swallowing and voice change.   Eyes: Negative for visual disturbance.  Respiratory: Positive for shortness of breath. Negative for cough, wheezing and stridor.   Cardiovascular: Negative for chest pain, palpitations and leg swelling.  Gastrointestinal: Negative for nausea, vomiting, abdominal pain, diarrhea, constipation, blood in stool, abdominal distention and anal bleeding.  Genitourinary: Negative for dysuria and flank pain.  Musculoskeletal: Negative for arthralgias, gait problem, myalgias and neck pain.  Skin: Negative for color change and rash.  Neurological: Negative for dizziness and headaches.  Hematological: Negative for adenopathy. Does not bruise/bleed easily.  Psychiatric/Behavioral: Negative for suicidal ideas, sleep disturbance and dysphoric mood. The patient is not nervous/anxious.        Objective:    BP 140/78  Pulse 92  Resp 16  Wt 130 lb 12 oz (59.308 kg)  SpO2 97% Physical Exam  Constitutional: She is oriented to person, place, and time. She appears well-developed and well-nourished. No distress.  HENT:  Head: Normocephalic and atraumatic.  Right Ear: External ear normal.  Left  Ear: External ear normal.  Nose: Nose normal.  Mouth/Throat: Oropharynx is clear and moist. No oropharyngeal exudate.  Eyes: Conjunctivae are normal. Pupils are equal, round, and reactive to light. Right eye exhibits no discharge. Left eye exhibits no discharge. No scleral icterus.  Neck: Normal range of motion. Neck supple. No tracheal deviation present. No thyromegaly present.  Cardiovascular: Normal rate, regular rhythm, normal heart sounds and intact distal pulses.  Exam reveals no gallop and no friction rub.   No murmur heard. Pulmonary/Chest: Effort normal and breath sounds normal. No accessory muscle usage. Not tachypneic. No respiratory distress. She has no decreased breath sounds. She has no wheezes. She has no rhonchi. She has no rales. She exhibits no tenderness.  Abdominal: Soft. Normal appearance. There is no tenderness.    Musculoskeletal: Normal range of motion. She exhibits no edema and no tenderness.  Lymphadenopathy:    She has no cervical adenopathy.  Neurological: She is alert and oriented to person, place, and time. No cranial nerve deficit. She exhibits normal muscle tone. Coordination normal.  Skin: Skin is warm and dry. No rash noted. She is not diaphoretic. No erythema. No pallor.  Psychiatric: She has a normal mood and affect. Her behavior is normal. Judgment and thought content normal.          Assessment & Plan:   Problem List Items Addressed This Visit   Chronic kidney disease, stage 5, kidney failure     Tolerating PD well. Catheter site clean on exam. No recent issues with infection. Reviewed recent labs from [facility]. Follow up 3 months and prn.    Hypertension      BP Readings from Last 3 Encounters:  05/12/13 140/78  11/10/12 140/70  08/01/12 134/74   BP well controlled generally, however SBP slightly higher today. Pt metoprolol was decreased per cardiology given recent issues with CHF. Will request notes from cardiology.    Relevant Medications        metoprolol succinate (TOPROL-XL) 25 MG 24 hr tablet   Systolic heart failure, chronic     Symptoms consistent with chronic systolic CHF. Pt appears euvolemic today. Will request records on recent ECHO with her cardiologist. Follow up 3 months and prn.    Relevant Medications      metoprolol succinate (TOPROL-XL) 25 MG 24 hr tablet    Other Visit Diagnoses   Need for vaccination with 13-polyvalent pneumococcal conjugate vaccine    -  Primary    Relevant Orders       Pneumococcal conjugate vaccine 13-valent (Completed)        Return in about 3 months (around 08/11/2013).

## 2013-05-12 NOTE — Assessment & Plan Note (Signed)
Symptoms consistent with chronic systolic CHF. Pt appears euvolemic today. Will request records on recent ECHO with her cardiologist. Follow up 3 months and prn.

## 2013-05-15 ENCOUNTER — Telehealth: Payer: Self-pay | Admitting: Internal Medicine

## 2013-05-15 NOTE — Telephone Encounter (Signed)
emmi mailed to patient °

## 2013-05-17 ENCOUNTER — Other Ambulatory Visit: Payer: Self-pay | Admitting: Internal Medicine

## 2013-05-18 NOTE — Telephone Encounter (Signed)
Escribed

## 2013-05-22 DIAGNOSIS — I209 Angina pectoris, unspecified: Secondary | ICD-10-CM | POA: Diagnosis not present

## 2013-05-25 DIAGNOSIS — I130 Hypertensive heart and chronic kidney disease with heart failure and stage 1 through stage 4 chronic kidney disease, or unspecified chronic kidney disease: Secondary | ICD-10-CM | POA: Diagnosis not present

## 2013-05-25 DIAGNOSIS — I5022 Chronic systolic (congestive) heart failure: Secondary | ICD-10-CM | POA: Diagnosis not present

## 2013-05-25 DIAGNOSIS — I059 Rheumatic mitral valve disease, unspecified: Secondary | ICD-10-CM | POA: Diagnosis not present

## 2013-05-25 DIAGNOSIS — R9389 Abnormal findings on diagnostic imaging of other specified body structures: Secondary | ICD-10-CM | POA: Diagnosis not present

## 2013-05-25 DIAGNOSIS — N039 Chronic nephritic syndrome with unspecified morphologic changes: Secondary | ICD-10-CM | POA: Diagnosis not present

## 2013-05-26 DIAGNOSIS — D509 Iron deficiency anemia, unspecified: Secondary | ICD-10-CM | POA: Diagnosis not present

## 2013-05-26 DIAGNOSIS — N186 End stage renal disease: Secondary | ICD-10-CM | POA: Diagnosis not present

## 2013-05-27 DIAGNOSIS — N186 End stage renal disease: Secondary | ICD-10-CM | POA: Diagnosis not present

## 2013-05-27 DIAGNOSIS — D509 Iron deficiency anemia, unspecified: Secondary | ICD-10-CM | POA: Diagnosis not present

## 2013-05-29 DIAGNOSIS — N186 End stage renal disease: Secondary | ICD-10-CM | POA: Diagnosis not present

## 2013-05-29 DIAGNOSIS — D509 Iron deficiency anemia, unspecified: Secondary | ICD-10-CM | POA: Diagnosis not present

## 2013-05-30 DIAGNOSIS — D509 Iron deficiency anemia, unspecified: Secondary | ICD-10-CM | POA: Diagnosis not present

## 2013-05-30 DIAGNOSIS — N186 End stage renal disease: Secondary | ICD-10-CM | POA: Diagnosis not present

## 2013-05-31 ENCOUNTER — Other Ambulatory Visit: Payer: Self-pay | Admitting: Internal Medicine

## 2013-06-01 DIAGNOSIS — D509 Iron deficiency anemia, unspecified: Secondary | ICD-10-CM | POA: Diagnosis not present

## 2013-06-01 DIAGNOSIS — N186 End stage renal disease: Secondary | ICD-10-CM | POA: Diagnosis not present

## 2013-06-02 DIAGNOSIS — D509 Iron deficiency anemia, unspecified: Secondary | ICD-10-CM | POA: Diagnosis not present

## 2013-06-02 DIAGNOSIS — N186 End stage renal disease: Secondary | ICD-10-CM | POA: Diagnosis not present

## 2013-06-03 DIAGNOSIS — N186 End stage renal disease: Secondary | ICD-10-CM | POA: Diagnosis not present

## 2013-06-03 DIAGNOSIS — D509 Iron deficiency anemia, unspecified: Secondary | ICD-10-CM | POA: Diagnosis not present

## 2013-06-05 DIAGNOSIS — N186 End stage renal disease: Secondary | ICD-10-CM | POA: Diagnosis not present

## 2013-06-05 DIAGNOSIS — D509 Iron deficiency anemia, unspecified: Secondary | ICD-10-CM | POA: Diagnosis not present

## 2013-06-06 DIAGNOSIS — D509 Iron deficiency anemia, unspecified: Secondary | ICD-10-CM | POA: Diagnosis not present

## 2013-06-06 DIAGNOSIS — N186 End stage renal disease: Secondary | ICD-10-CM | POA: Diagnosis not present

## 2013-06-08 DIAGNOSIS — D509 Iron deficiency anemia, unspecified: Secondary | ICD-10-CM | POA: Diagnosis not present

## 2013-06-08 DIAGNOSIS — N186 End stage renal disease: Secondary | ICD-10-CM | POA: Diagnosis not present

## 2013-06-09 DIAGNOSIS — D509 Iron deficiency anemia, unspecified: Secondary | ICD-10-CM | POA: Diagnosis not present

## 2013-06-09 DIAGNOSIS — N186 End stage renal disease: Secondary | ICD-10-CM | POA: Diagnosis not present

## 2013-06-10 DIAGNOSIS — D509 Iron deficiency anemia, unspecified: Secondary | ICD-10-CM | POA: Diagnosis not present

## 2013-06-10 DIAGNOSIS — N186 End stage renal disease: Secondary | ICD-10-CM | POA: Diagnosis not present

## 2013-06-12 DIAGNOSIS — N186 End stage renal disease: Secondary | ICD-10-CM | POA: Diagnosis not present

## 2013-06-12 DIAGNOSIS — D509 Iron deficiency anemia, unspecified: Secondary | ICD-10-CM | POA: Diagnosis not present

## 2013-06-13 DIAGNOSIS — N186 End stage renal disease: Secondary | ICD-10-CM | POA: Diagnosis not present

## 2013-06-13 DIAGNOSIS — D509 Iron deficiency anemia, unspecified: Secondary | ICD-10-CM | POA: Diagnosis not present

## 2013-06-15 DIAGNOSIS — N186 End stage renal disease: Secondary | ICD-10-CM | POA: Diagnosis not present

## 2013-06-15 DIAGNOSIS — D509 Iron deficiency anemia, unspecified: Secondary | ICD-10-CM | POA: Diagnosis not present

## 2013-06-16 DIAGNOSIS — N186 End stage renal disease: Secondary | ICD-10-CM | POA: Diagnosis not present

## 2013-06-16 DIAGNOSIS — D509 Iron deficiency anemia, unspecified: Secondary | ICD-10-CM | POA: Diagnosis not present

## 2013-06-17 DIAGNOSIS — D509 Iron deficiency anemia, unspecified: Secondary | ICD-10-CM | POA: Diagnosis not present

## 2013-06-17 DIAGNOSIS — N186 End stage renal disease: Secondary | ICD-10-CM | POA: Diagnosis not present

## 2013-06-19 DIAGNOSIS — N186 End stage renal disease: Secondary | ICD-10-CM | POA: Diagnosis not present

## 2013-06-19 DIAGNOSIS — D509 Iron deficiency anemia, unspecified: Secondary | ICD-10-CM | POA: Diagnosis not present

## 2013-06-20 DIAGNOSIS — N186 End stage renal disease: Secondary | ICD-10-CM | POA: Diagnosis not present

## 2013-06-20 DIAGNOSIS — D509 Iron deficiency anemia, unspecified: Secondary | ICD-10-CM | POA: Diagnosis not present

## 2013-06-22 DIAGNOSIS — N186 End stage renal disease: Secondary | ICD-10-CM | POA: Diagnosis not present

## 2013-06-22 DIAGNOSIS — D509 Iron deficiency anemia, unspecified: Secondary | ICD-10-CM | POA: Diagnosis not present

## 2013-06-23 DIAGNOSIS — N186 End stage renal disease: Secondary | ICD-10-CM | POA: Diagnosis not present

## 2013-06-23 DIAGNOSIS — D509 Iron deficiency anemia, unspecified: Secondary | ICD-10-CM | POA: Diagnosis not present

## 2013-06-24 DIAGNOSIS — N186 End stage renal disease: Secondary | ICD-10-CM | POA: Diagnosis not present

## 2013-06-24 DIAGNOSIS — D509 Iron deficiency anemia, unspecified: Secondary | ICD-10-CM | POA: Diagnosis not present

## 2013-06-26 DIAGNOSIS — N186 End stage renal disease: Secondary | ICD-10-CM | POA: Diagnosis not present

## 2013-06-26 DIAGNOSIS — D509 Iron deficiency anemia, unspecified: Secondary | ICD-10-CM | POA: Diagnosis not present

## 2013-06-27 DIAGNOSIS — D509 Iron deficiency anemia, unspecified: Secondary | ICD-10-CM | POA: Diagnosis not present

## 2013-06-27 DIAGNOSIS — N186 End stage renal disease: Secondary | ICD-10-CM | POA: Diagnosis not present

## 2013-06-29 DIAGNOSIS — D509 Iron deficiency anemia, unspecified: Secondary | ICD-10-CM | POA: Diagnosis not present

## 2013-06-29 DIAGNOSIS — N186 End stage renal disease: Secondary | ICD-10-CM | POA: Diagnosis not present

## 2013-06-30 DIAGNOSIS — D509 Iron deficiency anemia, unspecified: Secondary | ICD-10-CM | POA: Diagnosis not present

## 2013-06-30 DIAGNOSIS — N186 End stage renal disease: Secondary | ICD-10-CM | POA: Diagnosis not present

## 2013-07-01 DIAGNOSIS — D509 Iron deficiency anemia, unspecified: Secondary | ICD-10-CM | POA: Diagnosis not present

## 2013-07-01 DIAGNOSIS — N186 End stage renal disease: Secondary | ICD-10-CM | POA: Diagnosis not present

## 2013-07-03 DIAGNOSIS — D509 Iron deficiency anemia, unspecified: Secondary | ICD-10-CM | POA: Diagnosis not present

## 2013-07-03 DIAGNOSIS — N186 End stage renal disease: Secondary | ICD-10-CM | POA: Diagnosis not present

## 2013-07-04 DIAGNOSIS — N186 End stage renal disease: Secondary | ICD-10-CM | POA: Diagnosis not present

## 2013-07-04 DIAGNOSIS — D509 Iron deficiency anemia, unspecified: Secondary | ICD-10-CM | POA: Diagnosis not present

## 2013-07-05 ENCOUNTER — Other Ambulatory Visit: Payer: Self-pay | Admitting: Internal Medicine

## 2013-07-06 DIAGNOSIS — D509 Iron deficiency anemia, unspecified: Secondary | ICD-10-CM | POA: Diagnosis not present

## 2013-07-06 DIAGNOSIS — N186 End stage renal disease: Secondary | ICD-10-CM | POA: Diagnosis not present

## 2013-07-07 DIAGNOSIS — N186 End stage renal disease: Secondary | ICD-10-CM | POA: Diagnosis not present

## 2013-07-07 DIAGNOSIS — D509 Iron deficiency anemia, unspecified: Secondary | ICD-10-CM | POA: Diagnosis not present

## 2013-07-08 DIAGNOSIS — N186 End stage renal disease: Secondary | ICD-10-CM | POA: Diagnosis not present

## 2013-07-08 DIAGNOSIS — D509 Iron deficiency anemia, unspecified: Secondary | ICD-10-CM | POA: Diagnosis not present

## 2013-07-10 DIAGNOSIS — D509 Iron deficiency anemia, unspecified: Secondary | ICD-10-CM | POA: Diagnosis not present

## 2013-07-10 DIAGNOSIS — N186 End stage renal disease: Secondary | ICD-10-CM | POA: Diagnosis not present

## 2013-07-11 DIAGNOSIS — D509 Iron deficiency anemia, unspecified: Secondary | ICD-10-CM | POA: Diagnosis not present

## 2013-07-11 DIAGNOSIS — N186 End stage renal disease: Secondary | ICD-10-CM | POA: Diagnosis not present

## 2013-07-13 DIAGNOSIS — D509 Iron deficiency anemia, unspecified: Secondary | ICD-10-CM | POA: Diagnosis not present

## 2013-07-13 DIAGNOSIS — N186 End stage renal disease: Secondary | ICD-10-CM | POA: Diagnosis not present

## 2013-07-14 DIAGNOSIS — D509 Iron deficiency anemia, unspecified: Secondary | ICD-10-CM | POA: Diagnosis not present

## 2013-07-14 DIAGNOSIS — N186 End stage renal disease: Secondary | ICD-10-CM | POA: Diagnosis not present

## 2013-07-15 DIAGNOSIS — D509 Iron deficiency anemia, unspecified: Secondary | ICD-10-CM | POA: Diagnosis not present

## 2013-07-15 DIAGNOSIS — N186 End stage renal disease: Secondary | ICD-10-CM | POA: Diagnosis not present

## 2013-07-17 DIAGNOSIS — N186 End stage renal disease: Secondary | ICD-10-CM | POA: Diagnosis not present

## 2013-07-17 DIAGNOSIS — D509 Iron deficiency anemia, unspecified: Secondary | ICD-10-CM | POA: Diagnosis not present

## 2013-07-18 DIAGNOSIS — D509 Iron deficiency anemia, unspecified: Secondary | ICD-10-CM | POA: Diagnosis not present

## 2013-07-18 DIAGNOSIS — N186 End stage renal disease: Secondary | ICD-10-CM | POA: Diagnosis not present

## 2013-07-20 DIAGNOSIS — D509 Iron deficiency anemia, unspecified: Secondary | ICD-10-CM | POA: Diagnosis not present

## 2013-07-20 DIAGNOSIS — N186 End stage renal disease: Secondary | ICD-10-CM | POA: Diagnosis not present

## 2013-07-21 DIAGNOSIS — N186 End stage renal disease: Secondary | ICD-10-CM | POA: Diagnosis not present

## 2013-07-21 DIAGNOSIS — D509 Iron deficiency anemia, unspecified: Secondary | ICD-10-CM | POA: Diagnosis not present

## 2013-07-22 DIAGNOSIS — N186 End stage renal disease: Secondary | ICD-10-CM | POA: Diagnosis not present

## 2013-07-22 DIAGNOSIS — D509 Iron deficiency anemia, unspecified: Secondary | ICD-10-CM | POA: Diagnosis not present

## 2013-07-24 DIAGNOSIS — D509 Iron deficiency anemia, unspecified: Secondary | ICD-10-CM | POA: Diagnosis not present

## 2013-07-24 DIAGNOSIS — N186 End stage renal disease: Secondary | ICD-10-CM | POA: Diagnosis not present

## 2013-07-25 DIAGNOSIS — D509 Iron deficiency anemia, unspecified: Secondary | ICD-10-CM | POA: Diagnosis not present

## 2013-07-25 DIAGNOSIS — N186 End stage renal disease: Secondary | ICD-10-CM | POA: Diagnosis not present

## 2013-07-26 DIAGNOSIS — N186 End stage renal disease: Secondary | ICD-10-CM | POA: Diagnosis not present

## 2013-07-27 DIAGNOSIS — E559 Vitamin D deficiency, unspecified: Secondary | ICD-10-CM | POA: Diagnosis not present

## 2013-07-27 DIAGNOSIS — N186 End stage renal disease: Secondary | ICD-10-CM | POA: Diagnosis not present

## 2013-07-27 DIAGNOSIS — N2581 Secondary hyperparathyroidism of renal origin: Secondary | ICD-10-CM | POA: Diagnosis not present

## 2013-07-27 DIAGNOSIS — D509 Iron deficiency anemia, unspecified: Secondary | ICD-10-CM | POA: Diagnosis not present

## 2013-07-28 DIAGNOSIS — D509 Iron deficiency anemia, unspecified: Secondary | ICD-10-CM | POA: Diagnosis not present

## 2013-07-28 DIAGNOSIS — N186 End stage renal disease: Secondary | ICD-10-CM | POA: Diagnosis not present

## 2013-07-28 DIAGNOSIS — E559 Vitamin D deficiency, unspecified: Secondary | ICD-10-CM | POA: Diagnosis not present

## 2013-07-28 DIAGNOSIS — N2581 Secondary hyperparathyroidism of renal origin: Secondary | ICD-10-CM | POA: Diagnosis not present

## 2013-07-29 DIAGNOSIS — E559 Vitamin D deficiency, unspecified: Secondary | ICD-10-CM | POA: Diagnosis not present

## 2013-07-29 DIAGNOSIS — N2581 Secondary hyperparathyroidism of renal origin: Secondary | ICD-10-CM | POA: Diagnosis not present

## 2013-07-29 DIAGNOSIS — N186 End stage renal disease: Secondary | ICD-10-CM | POA: Diagnosis not present

## 2013-07-29 DIAGNOSIS — D509 Iron deficiency anemia, unspecified: Secondary | ICD-10-CM | POA: Diagnosis not present

## 2013-07-31 DIAGNOSIS — N2581 Secondary hyperparathyroidism of renal origin: Secondary | ICD-10-CM | POA: Diagnosis not present

## 2013-07-31 DIAGNOSIS — E559 Vitamin D deficiency, unspecified: Secondary | ICD-10-CM | POA: Diagnosis not present

## 2013-07-31 DIAGNOSIS — N186 End stage renal disease: Secondary | ICD-10-CM | POA: Diagnosis not present

## 2013-07-31 DIAGNOSIS — D509 Iron deficiency anemia, unspecified: Secondary | ICD-10-CM | POA: Diagnosis not present

## 2013-08-01 DIAGNOSIS — N186 End stage renal disease: Secondary | ICD-10-CM | POA: Diagnosis not present

## 2013-08-01 DIAGNOSIS — D509 Iron deficiency anemia, unspecified: Secondary | ICD-10-CM | POA: Diagnosis not present

## 2013-08-01 DIAGNOSIS — N2581 Secondary hyperparathyroidism of renal origin: Secondary | ICD-10-CM | POA: Diagnosis not present

## 2013-08-01 DIAGNOSIS — E559 Vitamin D deficiency, unspecified: Secondary | ICD-10-CM | POA: Diagnosis not present

## 2013-08-03 DIAGNOSIS — N2581 Secondary hyperparathyroidism of renal origin: Secondary | ICD-10-CM | POA: Diagnosis not present

## 2013-08-03 DIAGNOSIS — D509 Iron deficiency anemia, unspecified: Secondary | ICD-10-CM | POA: Diagnosis not present

## 2013-08-03 DIAGNOSIS — E559 Vitamin D deficiency, unspecified: Secondary | ICD-10-CM | POA: Diagnosis not present

## 2013-08-03 DIAGNOSIS — N186 End stage renal disease: Secondary | ICD-10-CM | POA: Diagnosis not present

## 2013-08-04 DIAGNOSIS — E559 Vitamin D deficiency, unspecified: Secondary | ICD-10-CM | POA: Diagnosis not present

## 2013-08-04 DIAGNOSIS — D509 Iron deficiency anemia, unspecified: Secondary | ICD-10-CM | POA: Diagnosis not present

## 2013-08-04 DIAGNOSIS — N186 End stage renal disease: Secondary | ICD-10-CM | POA: Diagnosis not present

## 2013-08-04 DIAGNOSIS — N2581 Secondary hyperparathyroidism of renal origin: Secondary | ICD-10-CM | POA: Diagnosis not present

## 2013-08-05 DIAGNOSIS — E559 Vitamin D deficiency, unspecified: Secondary | ICD-10-CM | POA: Diagnosis not present

## 2013-08-05 DIAGNOSIS — N186 End stage renal disease: Secondary | ICD-10-CM | POA: Diagnosis not present

## 2013-08-05 DIAGNOSIS — D509 Iron deficiency anemia, unspecified: Secondary | ICD-10-CM | POA: Diagnosis not present

## 2013-08-05 DIAGNOSIS — N2581 Secondary hyperparathyroidism of renal origin: Secondary | ICD-10-CM | POA: Diagnosis not present

## 2013-08-07 DIAGNOSIS — E559 Vitamin D deficiency, unspecified: Secondary | ICD-10-CM | POA: Diagnosis not present

## 2013-08-07 DIAGNOSIS — D509 Iron deficiency anemia, unspecified: Secondary | ICD-10-CM | POA: Diagnosis not present

## 2013-08-07 DIAGNOSIS — N186 End stage renal disease: Secondary | ICD-10-CM | POA: Diagnosis not present

## 2013-08-07 DIAGNOSIS — N2581 Secondary hyperparathyroidism of renal origin: Secondary | ICD-10-CM | POA: Diagnosis not present

## 2013-08-08 DIAGNOSIS — N2581 Secondary hyperparathyroidism of renal origin: Secondary | ICD-10-CM | POA: Diagnosis not present

## 2013-08-08 DIAGNOSIS — E559 Vitamin D deficiency, unspecified: Secondary | ICD-10-CM | POA: Diagnosis not present

## 2013-08-08 DIAGNOSIS — N186 End stage renal disease: Secondary | ICD-10-CM | POA: Diagnosis not present

## 2013-08-08 DIAGNOSIS — D509 Iron deficiency anemia, unspecified: Secondary | ICD-10-CM | POA: Diagnosis not present

## 2013-08-10 DIAGNOSIS — D509 Iron deficiency anemia, unspecified: Secondary | ICD-10-CM | POA: Diagnosis not present

## 2013-08-10 DIAGNOSIS — E559 Vitamin D deficiency, unspecified: Secondary | ICD-10-CM | POA: Diagnosis not present

## 2013-08-10 DIAGNOSIS — N2581 Secondary hyperparathyroidism of renal origin: Secondary | ICD-10-CM | POA: Diagnosis not present

## 2013-08-10 DIAGNOSIS — N186 End stage renal disease: Secondary | ICD-10-CM | POA: Diagnosis not present

## 2013-08-11 ENCOUNTER — Ambulatory Visit: Payer: Medicare Other | Admitting: Internal Medicine

## 2013-08-11 DIAGNOSIS — N186 End stage renal disease: Secondary | ICD-10-CM | POA: Diagnosis not present

## 2013-08-11 DIAGNOSIS — D509 Iron deficiency anemia, unspecified: Secondary | ICD-10-CM | POA: Diagnosis not present

## 2013-08-11 DIAGNOSIS — E559 Vitamin D deficiency, unspecified: Secondary | ICD-10-CM | POA: Diagnosis not present

## 2013-08-11 DIAGNOSIS — N2581 Secondary hyperparathyroidism of renal origin: Secondary | ICD-10-CM | POA: Diagnosis not present

## 2013-08-12 DIAGNOSIS — D509 Iron deficiency anemia, unspecified: Secondary | ICD-10-CM | POA: Diagnosis not present

## 2013-08-12 DIAGNOSIS — N2581 Secondary hyperparathyroidism of renal origin: Secondary | ICD-10-CM | POA: Diagnosis not present

## 2013-08-12 DIAGNOSIS — N186 End stage renal disease: Secondary | ICD-10-CM | POA: Diagnosis not present

## 2013-08-12 DIAGNOSIS — E559 Vitamin D deficiency, unspecified: Secondary | ICD-10-CM | POA: Diagnosis not present

## 2013-08-14 DIAGNOSIS — N186 End stage renal disease: Secondary | ICD-10-CM | POA: Diagnosis not present

## 2013-08-14 DIAGNOSIS — D509 Iron deficiency anemia, unspecified: Secondary | ICD-10-CM | POA: Diagnosis not present

## 2013-08-14 DIAGNOSIS — E559 Vitamin D deficiency, unspecified: Secondary | ICD-10-CM | POA: Diagnosis not present

## 2013-08-14 DIAGNOSIS — N2581 Secondary hyperparathyroidism of renal origin: Secondary | ICD-10-CM | POA: Diagnosis not present

## 2013-08-15 DIAGNOSIS — D509 Iron deficiency anemia, unspecified: Secondary | ICD-10-CM | POA: Diagnosis not present

## 2013-08-15 DIAGNOSIS — N186 End stage renal disease: Secondary | ICD-10-CM | POA: Diagnosis not present

## 2013-08-15 DIAGNOSIS — N2581 Secondary hyperparathyroidism of renal origin: Secondary | ICD-10-CM | POA: Diagnosis not present

## 2013-08-15 DIAGNOSIS — E559 Vitamin D deficiency, unspecified: Secondary | ICD-10-CM | POA: Diagnosis not present

## 2013-08-17 DIAGNOSIS — N186 End stage renal disease: Secondary | ICD-10-CM | POA: Diagnosis not present

## 2013-08-17 DIAGNOSIS — D509 Iron deficiency anemia, unspecified: Secondary | ICD-10-CM | POA: Diagnosis not present

## 2013-08-17 DIAGNOSIS — E559 Vitamin D deficiency, unspecified: Secondary | ICD-10-CM | POA: Diagnosis not present

## 2013-08-17 DIAGNOSIS — N2581 Secondary hyperparathyroidism of renal origin: Secondary | ICD-10-CM | POA: Diagnosis not present

## 2013-08-18 ENCOUNTER — Ambulatory Visit: Payer: Medicare Other | Admitting: Internal Medicine

## 2013-08-18 DIAGNOSIS — N2581 Secondary hyperparathyroidism of renal origin: Secondary | ICD-10-CM | POA: Diagnosis not present

## 2013-08-18 DIAGNOSIS — E559 Vitamin D deficiency, unspecified: Secondary | ICD-10-CM | POA: Diagnosis not present

## 2013-08-18 DIAGNOSIS — D509 Iron deficiency anemia, unspecified: Secondary | ICD-10-CM | POA: Diagnosis not present

## 2013-08-18 DIAGNOSIS — N186 End stage renal disease: Secondary | ICD-10-CM | POA: Diagnosis not present

## 2013-08-19 DIAGNOSIS — N186 End stage renal disease: Secondary | ICD-10-CM | POA: Diagnosis not present

## 2013-08-19 DIAGNOSIS — N2581 Secondary hyperparathyroidism of renal origin: Secondary | ICD-10-CM | POA: Diagnosis not present

## 2013-08-19 DIAGNOSIS — E559 Vitamin D deficiency, unspecified: Secondary | ICD-10-CM | POA: Diagnosis not present

## 2013-08-19 DIAGNOSIS — D509 Iron deficiency anemia, unspecified: Secondary | ICD-10-CM | POA: Diagnosis not present

## 2013-08-21 DIAGNOSIS — N2581 Secondary hyperparathyroidism of renal origin: Secondary | ICD-10-CM | POA: Diagnosis not present

## 2013-08-21 DIAGNOSIS — D509 Iron deficiency anemia, unspecified: Secondary | ICD-10-CM | POA: Diagnosis not present

## 2013-08-21 DIAGNOSIS — N186 End stage renal disease: Secondary | ICD-10-CM | POA: Diagnosis not present

## 2013-08-21 DIAGNOSIS — E559 Vitamin D deficiency, unspecified: Secondary | ICD-10-CM | POA: Diagnosis not present

## 2013-08-22 DIAGNOSIS — E559 Vitamin D deficiency, unspecified: Secondary | ICD-10-CM | POA: Diagnosis not present

## 2013-08-22 DIAGNOSIS — N2581 Secondary hyperparathyroidism of renal origin: Secondary | ICD-10-CM | POA: Diagnosis not present

## 2013-08-22 DIAGNOSIS — D509 Iron deficiency anemia, unspecified: Secondary | ICD-10-CM | POA: Diagnosis not present

## 2013-08-22 DIAGNOSIS — N186 End stage renal disease: Secondary | ICD-10-CM | POA: Diagnosis not present

## 2013-08-24 DIAGNOSIS — E559 Vitamin D deficiency, unspecified: Secondary | ICD-10-CM | POA: Diagnosis not present

## 2013-08-24 DIAGNOSIS — N186 End stage renal disease: Secondary | ICD-10-CM | POA: Diagnosis not present

## 2013-08-24 DIAGNOSIS — N2581 Secondary hyperparathyroidism of renal origin: Secondary | ICD-10-CM | POA: Diagnosis not present

## 2013-08-24 DIAGNOSIS — D509 Iron deficiency anemia, unspecified: Secondary | ICD-10-CM | POA: Diagnosis not present

## 2013-08-25 DIAGNOSIS — N186 End stage renal disease: Secondary | ICD-10-CM | POA: Diagnosis not present

## 2013-08-25 DIAGNOSIS — D509 Iron deficiency anemia, unspecified: Secondary | ICD-10-CM | POA: Diagnosis not present

## 2013-08-25 DIAGNOSIS — N2581 Secondary hyperparathyroidism of renal origin: Secondary | ICD-10-CM | POA: Diagnosis not present

## 2013-08-25 DIAGNOSIS — E559 Vitamin D deficiency, unspecified: Secondary | ICD-10-CM | POA: Diagnosis not present

## 2013-08-26 DIAGNOSIS — D509 Iron deficiency anemia, unspecified: Secondary | ICD-10-CM | POA: Diagnosis not present

## 2013-08-26 DIAGNOSIS — N186 End stage renal disease: Secondary | ICD-10-CM | POA: Diagnosis not present

## 2013-08-30 ENCOUNTER — Other Ambulatory Visit: Payer: Self-pay | Admitting: Internal Medicine

## 2013-08-31 NOTE — Telephone Encounter (Signed)
Last refill 8.5.15, last OV 4.17.15, next OV 8.18.15.  Pt has had two cancellations since 4.17.15.  Please advise refill.

## 2013-09-12 ENCOUNTER — Ambulatory Visit (INDEPENDENT_AMBULATORY_CARE_PROVIDER_SITE_OTHER): Payer: Medicare Other | Admitting: Internal Medicine

## 2013-09-12 ENCOUNTER — Encounter: Payer: Self-pay | Admitting: Internal Medicine

## 2013-09-12 VITALS — BP 146/80 | HR 87 | Temp 98.2°F | Ht 64.5 in | Wt 137.2 lb

## 2013-09-12 DIAGNOSIS — N185 Chronic kidney disease, stage 5: Secondary | ICD-10-CM | POA: Diagnosis not present

## 2013-09-12 DIAGNOSIS — R102 Pelvic and perineal pain: Secondary | ICD-10-CM | POA: Insufficient documentation

## 2013-09-12 DIAGNOSIS — I10 Essential (primary) hypertension: Secondary | ICD-10-CM | POA: Diagnosis not present

## 2013-09-12 DIAGNOSIS — M542 Cervicalgia: Secondary | ICD-10-CM | POA: Diagnosis not present

## 2013-09-12 DIAGNOSIS — I5022 Chronic systolic (congestive) heart failure: Secondary | ICD-10-CM

## 2013-09-12 DIAGNOSIS — G8929 Other chronic pain: Secondary | ICD-10-CM | POA: Diagnosis not present

## 2013-09-12 DIAGNOSIS — R413 Other amnesia: Secondary | ICD-10-CM | POA: Diagnosis not present

## 2013-09-12 DIAGNOSIS — N949 Unspecified condition associated with female genital organs and menstrual cycle: Secondary | ICD-10-CM | POA: Diagnosis not present

## 2013-09-12 DIAGNOSIS — R300 Dysuria: Secondary | ICD-10-CM | POA: Diagnosis not present

## 2013-09-12 LAB — POCT URINALYSIS DIPSTICK
BILIRUBIN UA: NEGATIVE
GLUCOSE UA: NEGATIVE
LEUKOCYTES UA: NEGATIVE
Nitrite, UA: NEGATIVE
PH UA: 5
Protein, UA: 100
Spec Grav, UA: 1.02
Urobilinogen, UA: 0.2

## 2013-09-12 LAB — TSH: TSH: 4.68 u[IU]/mL — AB (ref 0.35–4.50)

## 2013-09-12 LAB — VITAMIN B12: VITAMIN B 12: 804 pg/mL (ref 211–911)

## 2013-09-12 MED ORDER — CIPROFLOXACIN HCL 250 MG PO TABS: 250.0000 mg | ORAL_TABLET | Freq: Every day | ORAL | Status: DC

## 2013-09-12 MED ORDER — CIPROFLOXACIN HCL 750 MG PO TABS: 750.0000 mg | ORAL_TABLET | Freq: Every day | ORAL | Status: DC

## 2013-09-12 NOTE — Assessment & Plan Note (Signed)
Continues on Peritoneal Dialysis. Will request recent labs from [facility].

## 2013-09-12 NOTE — Assessment & Plan Note (Signed)
Dysuria and pelvic pain. UA pos for blood. Will send urine for culture. Will cover empirically with Cipro.

## 2013-09-12 NOTE — Assessment & Plan Note (Signed)
Recent worsening of short term memory. Will check TSH and B12 with labs. Will set up cognitive testing. Followup in 4 weeks.

## 2013-09-12 NOTE — Assessment & Plan Note (Signed)
BP Readings from Last 3 Encounters:  09/12/13 146/80  05/12/13 140/78  11/10/12 140/70   BP generally well controlled. Continue Metoprolol.

## 2013-09-12 NOTE — Progress Notes (Signed)
Pre visit review using our clinic review tool, if applicable. No additional management support is needed unless otherwise documented below in the visit note. 

## 2013-09-12 NOTE — Patient Instructions (Addendum)
Start Cipro 250mg  daily after dialysis.  Call immediately if any fever, chills, or worsening pain.  Labs today to check B12 level.  We will set up referral for neck and back pain.  We will also set up cognitive testing.  Follow up in 4 weeks.

## 2013-09-12 NOTE — Assessment & Plan Note (Signed)
Reviewed recent notes from cardiology. EF 20% on last ECHO. Symptomatically, doing well. Appears euvolemic on exam. Follow up with cardiology scheduled.

## 2013-09-12 NOTE — Progress Notes (Signed)
Subjective:    Patient ID: Sharon [name], female    DOB: 03-28-1926, 80 y.o.   MRN: [id number]  HPI 80YO female presents for follow up.  Woke in middle of night with lower pelvic pain and pressure. Concerned about bladder infection. No redness or pain at catheter site. No hematuria. No fever or chills.  Also continues to have diffuse arthralgia and myalgia. Particularly in left neck. Seen by ortho in the past, had steroid injection right shoulder with no improvement.   She is also concerned about short term memory getting worse over the last few months. Forgets what she was doing or thinking about.   Aside from this, she has been feeling well. Continues daily peritoneal dialysis. No recent problems with this.   Review of Systems  Constitutional: Negative for fever, chills, appetite change, fatigue and unexpected weight change.  Eyes: Negative for visual disturbance.  Respiratory: Negative for shortness of breath.   Cardiovascular: Negative for chest pain and leg swelling.  Gastrointestinal: Negative for abdominal pain.  Genitourinary: Positive for dysuria and pelvic pain (burning pressure in lower pelvis). Negative for urgency, frequency, hematuria, flank pain and difficulty urinating.  Skin: Negative for color change and rash.  Hematological: Negative for adenopathy. Does not bruise/bleed easily.  Psychiatric/Behavioral: Negative for dysphoric mood. The patient is not nervous/anxious.        Objective:    BP 146/80  Pulse 87  Temp(Src) 98.2 F (36.8 C) (Oral)  Ht 5' 4.5" (1.638 m)  Wt 137 lb 4 oz (62.256 kg)  BMI 23.20 kg/m2  SpO2 97% Physical Exam  Constitutional: She is oriented to person, place, and time. She appears well-developed and well-nourished. No distress.  HENT:  Head: Normocephalic and atraumatic.  Right Ear: External ear normal.  Left Ear: External ear normal.  Nose: Nose normal.  Mouth/Throat: Oropharynx is clear and moist. No oropharyngeal exudate.    Eyes: Conjunctivae are normal. Pupils are equal, round, and reactive to light. Right eye exhibits no discharge. Left eye exhibits no discharge. No scleral icterus.  Neck: Normal range of motion. Neck supple. No tracheal deviation present. No thyromegaly present.  Cardiovascular: Normal rate, regular rhythm, normal heart sounds and intact distal pulses.  Exam reveals no gallop and no friction rub.   No murmur heard. Pulmonary/Chest: Effort normal and breath sounds normal. No accessory muscle usage. Not tachypneic. No respiratory distress. She has no decreased breath sounds. She has no wheezes. She has no rhonchi. She has no rales. She exhibits no tenderness.  Abdominal: Soft. Normal appearance. There is no tenderness.    Musculoskeletal: Normal range of motion. She exhibits no edema and no tenderness.  Lymphadenopathy:    She has no cervical adenopathy.  Neurological: She is alert and oriented to person, place, and time. No cranial nerve deficit. She exhibits normal muscle tone. Coordination normal.  Skin: Skin is warm and dry. No rash noted. She is not diaphoretic. No erythema. No pallor.  Psychiatric: She has a normal mood and affect. Her behavior is normal. Judgment and thought content normal.          Assessment & Plan:  Over 49min of which >50% spent in face-to-face contact with patient discussing plan of care    Problem List Items Addressed This Visit     Unprioritized   Chronic kidney disease, stage 5, kidney failure - Primary     Continues on Peritoneal Dialysis. Will request recent labs from [facility].    Relevant Orders  CULTURE, URINE COMPREHENSIVE   Essential hypertension   Relevant Orders      CULTURE, URINE COMPREHENSIVE   Memory loss     Recent worsening of short term memory. Will check TSH and B12 with labs. Will set up cognitive testing. Followup in 4 weeks.    Relevant Orders      B12      TSH      Ambulatory referral to Neuropsychology    Neck pain of over 3 months duration     Chronic neck and back pain. Extensive evaluation in the past including rheumatology workup, reportedly normal. No improvement with Tylenol. Unable to take NSAIDS because of ESRD. Unable to tolerate narcotic pain medications because of drowsiness. Will set up referral to Dr. Sharlet Salina. Question if epidural injection might be helpful.    Relevant Orders      Ambulatory referral to Physical Medicine Rehab      CULTURE, URINE COMPREHENSIVE   Pelvic pain in female     Dysuria and pelvic pain. UA pos for blood. Will send urine for culture. Will cover empirically with Cipro.    Relevant Medications      ciprofloxacin (CIPRO) tablet   Other Relevant Orders      POCT Urinalysis Dipstick (Completed)      CULTURE, URINE COMPREHENSIVE   Systolic heart failure, chronic     Reviewed recent notes from cardiology. EF 20% on last ECHO. Symptomatically, doing well. Appears euvolemic on exam. Follow up with cardiology scheduled.        Return in about 4 weeks (around 10/10/2013) for Recheck.

## 2013-09-12 NOTE — Assessment & Plan Note (Signed)
Chronic neck and back pain. Extensive evaluation in the past including rheumatology workup, reportedly normal. No improvement with Tylenol. Unable to take NSAIDS because of ESRD. Unable to tolerate narcotic pain medications because of drowsiness. Will set up referral to Dr. Sharlet Salina. Question if epidural injection might be helpful.

## 2013-09-13 ENCOUNTER — Ambulatory Visit: Payer: Medicare Other

## 2013-09-13 DIAGNOSIS — E039 Hypothyroidism, unspecified: Secondary | ICD-10-CM

## 2013-09-13 LAB — T4, FREE: Free T4: 0.85 ng/dL (ref 0.60–1.60)

## 2013-09-14 LAB — CULTURE, URINE COMPREHENSIVE
Colony Count: NO GROWTH
ORGANISM ID, BACTERIA: NO GROWTH

## 2013-09-18 DIAGNOSIS — I498 Other specified cardiac arrhythmias: Secondary | ICD-10-CM | POA: Diagnosis not present

## 2013-09-18 DIAGNOSIS — I10 Essential (primary) hypertension: Secondary | ICD-10-CM | POA: Diagnosis not present

## 2013-09-18 DIAGNOSIS — I428 Other cardiomyopathies: Secondary | ICD-10-CM | POA: Diagnosis not present

## 2013-09-18 DIAGNOSIS — E785 Hyperlipidemia, unspecified: Secondary | ICD-10-CM | POA: Diagnosis not present

## 2013-09-26 DIAGNOSIS — N186 End stage renal disease: Secondary | ICD-10-CM | POA: Diagnosis not present

## 2013-09-26 DIAGNOSIS — Z992 Dependence on renal dialysis: Secondary | ICD-10-CM | POA: Diagnosis not present

## 2013-09-26 DIAGNOSIS — D509 Iron deficiency anemia, unspecified: Secondary | ICD-10-CM | POA: Diagnosis not present

## 2013-09-26 DIAGNOSIS — Z23 Encounter for immunization: Secondary | ICD-10-CM | POA: Diagnosis not present

## 2013-09-27 DIAGNOSIS — D485 Neoplasm of uncertain behavior of skin: Secondary | ICD-10-CM | POA: Diagnosis not present

## 2013-09-27 DIAGNOSIS — L578 Other skin changes due to chronic exposure to nonionizing radiation: Secondary | ICD-10-CM | POA: Diagnosis not present

## 2013-09-27 DIAGNOSIS — C4441 Basal cell carcinoma of skin of scalp and neck: Secondary | ICD-10-CM | POA: Diagnosis not present

## 2013-09-28 DIAGNOSIS — N186 End stage renal disease: Secondary | ICD-10-CM | POA: Diagnosis not present

## 2013-09-28 DIAGNOSIS — Z23 Encounter for immunization: Secondary | ICD-10-CM | POA: Diagnosis not present

## 2013-09-28 DIAGNOSIS — D509 Iron deficiency anemia, unspecified: Secondary | ICD-10-CM | POA: Diagnosis not present

## 2013-09-29 DIAGNOSIS — D509 Iron deficiency anemia, unspecified: Secondary | ICD-10-CM | POA: Diagnosis not present

## 2013-09-29 DIAGNOSIS — Z23 Encounter for immunization: Secondary | ICD-10-CM | POA: Diagnosis not present

## 2013-09-29 DIAGNOSIS — N186 End stage renal disease: Secondary | ICD-10-CM | POA: Diagnosis not present

## 2013-09-30 DIAGNOSIS — D509 Iron deficiency anemia, unspecified: Secondary | ICD-10-CM | POA: Diagnosis not present

## 2013-09-30 DIAGNOSIS — Z23 Encounter for immunization: Secondary | ICD-10-CM | POA: Diagnosis not present

## 2013-09-30 DIAGNOSIS — N186 End stage renal disease: Secondary | ICD-10-CM | POA: Diagnosis not present

## 2013-10-02 DIAGNOSIS — D509 Iron deficiency anemia, unspecified: Secondary | ICD-10-CM | POA: Diagnosis not present

## 2013-10-02 DIAGNOSIS — N186 End stage renal disease: Secondary | ICD-10-CM | POA: Diagnosis not present

## 2013-10-02 DIAGNOSIS — Z23 Encounter for immunization: Secondary | ICD-10-CM | POA: Diagnosis not present

## 2013-10-03 DIAGNOSIS — N186 End stage renal disease: Secondary | ICD-10-CM | POA: Diagnosis not present

## 2013-10-03 DIAGNOSIS — D509 Iron deficiency anemia, unspecified: Secondary | ICD-10-CM | POA: Diagnosis not present

## 2013-10-03 DIAGNOSIS — Z23 Encounter for immunization: Secondary | ICD-10-CM | POA: Diagnosis not present

## 2013-10-05 DIAGNOSIS — Z23 Encounter for immunization: Secondary | ICD-10-CM | POA: Diagnosis not present

## 2013-10-05 DIAGNOSIS — D509 Iron deficiency anemia, unspecified: Secondary | ICD-10-CM | POA: Diagnosis not present

## 2013-10-05 DIAGNOSIS — N186 End stage renal disease: Secondary | ICD-10-CM | POA: Diagnosis not present

## 2013-10-06 DIAGNOSIS — N186 End stage renal disease: Secondary | ICD-10-CM | POA: Diagnosis not present

## 2013-10-06 DIAGNOSIS — D509 Iron deficiency anemia, unspecified: Secondary | ICD-10-CM | POA: Diagnosis not present

## 2013-10-06 DIAGNOSIS — Z23 Encounter for immunization: Secondary | ICD-10-CM | POA: Diagnosis not present

## 2013-10-07 DIAGNOSIS — D509 Iron deficiency anemia, unspecified: Secondary | ICD-10-CM | POA: Diagnosis not present

## 2013-10-07 DIAGNOSIS — Z23 Encounter for immunization: Secondary | ICD-10-CM | POA: Diagnosis not present

## 2013-10-07 DIAGNOSIS — N186 End stage renal disease: Secondary | ICD-10-CM | POA: Diagnosis not present

## 2013-10-09 DIAGNOSIS — N186 End stage renal disease: Secondary | ICD-10-CM | POA: Diagnosis not present

## 2013-10-09 DIAGNOSIS — Z23 Encounter for immunization: Secondary | ICD-10-CM | POA: Diagnosis not present

## 2013-10-09 DIAGNOSIS — D509 Iron deficiency anemia, unspecified: Secondary | ICD-10-CM | POA: Diagnosis not present

## 2013-10-10 DIAGNOSIS — D509 Iron deficiency anemia, unspecified: Secondary | ICD-10-CM | POA: Diagnosis not present

## 2013-10-10 DIAGNOSIS — N186 End stage renal disease: Secondary | ICD-10-CM | POA: Diagnosis not present

## 2013-10-10 DIAGNOSIS — Z23 Encounter for immunization: Secondary | ICD-10-CM | POA: Diagnosis not present

## 2013-10-12 DIAGNOSIS — Z23 Encounter for immunization: Secondary | ICD-10-CM | POA: Diagnosis not present

## 2013-10-12 DIAGNOSIS — D509 Iron deficiency anemia, unspecified: Secondary | ICD-10-CM | POA: Diagnosis not present

## 2013-10-12 DIAGNOSIS — N186 End stage renal disease: Secondary | ICD-10-CM | POA: Diagnosis not present

## 2013-10-13 ENCOUNTER — Ambulatory Visit: Payer: Medicare Other | Admitting: Internal Medicine

## 2013-10-13 DIAGNOSIS — Z23 Encounter for immunization: Secondary | ICD-10-CM | POA: Diagnosis not present

## 2013-10-13 DIAGNOSIS — N186 End stage renal disease: Secondary | ICD-10-CM | POA: Diagnosis not present

## 2013-10-13 DIAGNOSIS — D509 Iron deficiency anemia, unspecified: Secondary | ICD-10-CM | POA: Diagnosis not present

## 2013-10-14 DIAGNOSIS — N186 End stage renal disease: Secondary | ICD-10-CM | POA: Diagnosis not present

## 2013-10-14 DIAGNOSIS — Z23 Encounter for immunization: Secondary | ICD-10-CM | POA: Diagnosis not present

## 2013-10-14 DIAGNOSIS — D509 Iron deficiency anemia, unspecified: Secondary | ICD-10-CM | POA: Diagnosis not present

## 2013-10-16 DIAGNOSIS — N186 End stage renal disease: Secondary | ICD-10-CM | POA: Diagnosis not present

## 2013-10-16 DIAGNOSIS — Z23 Encounter for immunization: Secondary | ICD-10-CM | POA: Diagnosis not present

## 2013-10-16 DIAGNOSIS — M5412 Radiculopathy, cervical region: Secondary | ICD-10-CM | POA: Diagnosis not present

## 2013-10-16 DIAGNOSIS — D509 Iron deficiency anemia, unspecified: Secondary | ICD-10-CM | POA: Diagnosis not present

## 2013-10-17 ENCOUNTER — Ambulatory Visit: Payer: Medicare Other | Admitting: Neurology

## 2013-10-17 DIAGNOSIS — Z23 Encounter for immunization: Secondary | ICD-10-CM | POA: Diagnosis not present

## 2013-10-17 DIAGNOSIS — N186 End stage renal disease: Secondary | ICD-10-CM | POA: Diagnosis not present

## 2013-10-17 DIAGNOSIS — D509 Iron deficiency anemia, unspecified: Secondary | ICD-10-CM | POA: Diagnosis not present

## 2013-10-18 ENCOUNTER — Ambulatory Visit: Payer: Self-pay | Admitting: Physical Medicine and Rehabilitation

## 2013-10-18 DIAGNOSIS — M5030 Other cervical disc degeneration, unspecified cervical region: Secondary | ICD-10-CM | POA: Diagnosis not present

## 2013-10-18 DIAGNOSIS — M25529 Pain in unspecified elbow: Secondary | ICD-10-CM | POA: Diagnosis not present

## 2013-10-18 DIAGNOSIS — M542 Cervicalgia: Secondary | ICD-10-CM | POA: Diagnosis not present

## 2013-10-18 DIAGNOSIS — M47812 Spondylosis without myelopathy or radiculopathy, cervical region: Secondary | ICD-10-CM | POA: Diagnosis not present

## 2013-10-19 DIAGNOSIS — N186 End stage renal disease: Secondary | ICD-10-CM | POA: Diagnosis not present

## 2013-10-19 DIAGNOSIS — D509 Iron deficiency anemia, unspecified: Secondary | ICD-10-CM | POA: Diagnosis not present

## 2013-10-19 DIAGNOSIS — Z23 Encounter for immunization: Secondary | ICD-10-CM | POA: Diagnosis not present

## 2013-10-20 DIAGNOSIS — D509 Iron deficiency anemia, unspecified: Secondary | ICD-10-CM | POA: Diagnosis not present

## 2013-10-20 DIAGNOSIS — Z23 Encounter for immunization: Secondary | ICD-10-CM | POA: Diagnosis not present

## 2013-10-20 DIAGNOSIS — N186 End stage renal disease: Secondary | ICD-10-CM | POA: Diagnosis not present

## 2013-10-21 DIAGNOSIS — N186 End stage renal disease: Secondary | ICD-10-CM | POA: Diagnosis not present

## 2013-10-21 DIAGNOSIS — D509 Iron deficiency anemia, unspecified: Secondary | ICD-10-CM | POA: Diagnosis not present

## 2013-10-21 DIAGNOSIS — Z23 Encounter for immunization: Secondary | ICD-10-CM | POA: Diagnosis not present

## 2013-10-23 DIAGNOSIS — D509 Iron deficiency anemia, unspecified: Secondary | ICD-10-CM | POA: Diagnosis not present

## 2013-10-23 DIAGNOSIS — N186 End stage renal disease: Secondary | ICD-10-CM | POA: Diagnosis not present

## 2013-10-23 DIAGNOSIS — Z23 Encounter for immunization: Secondary | ICD-10-CM | POA: Diagnosis not present

## 2013-10-24 DIAGNOSIS — Z23 Encounter for immunization: Secondary | ICD-10-CM | POA: Diagnosis not present

## 2013-10-24 DIAGNOSIS — N186 End stage renal disease: Secondary | ICD-10-CM | POA: Diagnosis not present

## 2013-10-24 DIAGNOSIS — D509 Iron deficiency anemia, unspecified: Secondary | ICD-10-CM | POA: Diagnosis not present

## 2013-10-26 DIAGNOSIS — Z992 Dependence on renal dialysis: Secondary | ICD-10-CM | POA: Diagnosis not present

## 2013-10-26 DIAGNOSIS — N2581 Secondary hyperparathyroidism of renal origin: Secondary | ICD-10-CM | POA: Diagnosis not present

## 2013-10-26 DIAGNOSIS — E559 Vitamin D deficiency, unspecified: Secondary | ICD-10-CM | POA: Diagnosis not present

## 2013-10-26 DIAGNOSIS — D509 Iron deficiency anemia, unspecified: Secondary | ICD-10-CM | POA: Diagnosis not present

## 2013-10-26 DIAGNOSIS — N186 End stage renal disease: Secondary | ICD-10-CM | POA: Diagnosis not present

## 2013-11-01 ENCOUNTER — Other Ambulatory Visit: Payer: Self-pay | Admitting: Internal Medicine

## 2013-11-07 DIAGNOSIS — M5030 Other cervical disc degeneration, unspecified cervical region: Secondary | ICD-10-CM | POA: Insufficient documentation

## 2013-11-07 DIAGNOSIS — M5412 Radiculopathy, cervical region: Secondary | ICD-10-CM | POA: Diagnosis not present

## 2013-11-08 DIAGNOSIS — C4441 Basal cell carcinoma of skin of scalp and neck: Secondary | ICD-10-CM | POA: Diagnosis not present

## 2013-11-14 ENCOUNTER — Ambulatory Visit (INDEPENDENT_AMBULATORY_CARE_PROVIDER_SITE_OTHER): Payer: Medicare Other | Admitting: Internal Medicine

## 2013-11-14 ENCOUNTER — Encounter: Payer: Self-pay | Admitting: Internal Medicine

## 2013-11-14 VITALS — BP 140/80 | HR 79 | Temp 98.4°F | Ht 64.5 in | Wt 135.2 lb

## 2013-11-14 DIAGNOSIS — M542 Cervicalgia: Secondary | ICD-10-CM

## 2013-11-14 DIAGNOSIS — C4441 Basal cell carcinoma of skin of scalp and neck: Secondary | ICD-10-CM | POA: Diagnosis not present

## 2013-11-14 DIAGNOSIS — I10 Essential (primary) hypertension: Secondary | ICD-10-CM | POA: Diagnosis not present

## 2013-11-14 DIAGNOSIS — N185 Chronic kidney disease, stage 5: Secondary | ICD-10-CM

## 2013-11-14 DIAGNOSIS — K589 Irritable bowel syndrome without diarrhea: Secondary | ICD-10-CM

## 2013-11-14 DIAGNOSIS — F32A Depression, unspecified: Secondary | ICD-10-CM

## 2013-11-14 DIAGNOSIS — I495 Sick sinus syndrome: Secondary | ICD-10-CM | POA: Diagnosis not present

## 2013-11-14 DIAGNOSIS — R413 Other amnesia: Secondary | ICD-10-CM

## 2013-11-14 DIAGNOSIS — F329 Major depressive disorder, single episode, unspecified: Secondary | ICD-10-CM

## 2013-11-14 NOTE — Assessment & Plan Note (Signed)
Continue to follow with Dr. Sharlet Salina.

## 2013-11-14 NOTE — Assessment & Plan Note (Signed)
Cognitive testing pending.

## 2013-11-14 NOTE — Assessment & Plan Note (Addendum)
IBS with intermittent diarrhea. Encouraged her to use Imodium when traveling outside her home. Follow up prn.

## 2013-11-14 NOTE — Assessment & Plan Note (Signed)
Tolerating PD well. Continue. Will request recent labs.

## 2013-11-14 NOTE — Assessment & Plan Note (Signed)
S/p resection by Dr. Phillip Heal.

## 2013-11-14 NOTE — Patient Instructions (Signed)
Follow up with Dr. Sharlet Salina as scheduled.  Follow up for cognitive testing as scheduled.  We will request your labs.  Follow up in 4 weeks.

## 2013-11-14 NOTE — Assessment & Plan Note (Signed)
She would like to consider stopping Wellbutrin. However, will wait until after cognitive evaluation complete.

## 2013-11-14 NOTE — Assessment & Plan Note (Signed)
BP Readings from Last 3 Encounters:  11/14/13 140/80  09/12/13 146/80  05/12/13 140/78   BP generally well controlled. Continue Metoprolol.

## 2013-11-14 NOTE — Progress Notes (Signed)
Pre visit review using our clinic review tool, if applicable. No additional management support is needed unless otherwise documented below in the visit note. 

## 2013-11-14 NOTE — Progress Notes (Signed)
Subjective:    Patient ID: Sharon [name], female    DOB: Jun 28, 1926, 80 y.o.   MRN: [id number]  HPI 80YO female presents for follow up.  Recently had resection of basal cell carcinoma on neck by Dr. Phillip Heal. Margins were clear.  ESRD - No recent issues with PD. Continues to make small amount of urine.  Continues to have some diarrhea after eating. Takes Imodium if leaving home for appointments. This helps some.  Had some recent labs 10/7.  Feels tired at times. Has memory testing scheduled. Follow up scheduled with Dr. Sharlet Salina for chronic upper back pain. Plans to try some physical therapy starting tomorrow. Not currently taking anything for pain except for occasional Tylenol.  Depression - Occasionally feels down, but not severe.  Review of Systems  Constitutional: Negative for fever, chills, appetite change, fatigue and unexpected weight change.  Eyes: Negative for visual disturbance.  Respiratory: Negative for shortness of breath.   Cardiovascular: Negative for chest pain and leg swelling.  Gastrointestinal: Positive for diarrhea. Negative for nausea, vomiting, abdominal pain, constipation and blood in stool.  Skin: Negative for color change and rash.  Hematological: Negative for adenopathy. Does not bruise/bleed easily.  Psychiatric/Behavioral: Positive for confusion, sleep disturbance and decreased concentration. Negative for suicidal ideas and dysphoric mood. The patient is nervous/anxious.        Objective:    BP 140/80  Pulse 79  Temp(Src) 98.4 F (36.9 C) (Oral)  Ht 5' 4.5" (1.638 m)  Wt 135 lb 4 oz (61.349 kg)  BMI 22.87 kg/m2  SpO2 98% Physical Exam  Constitutional: She is oriented to person, place, and time. She appears well-developed and well-nourished. No distress.  HENT:  Head: Normocephalic and atraumatic.  Right Ear: External ear normal. Decreased hearing is noted.  Left Ear: External ear normal. Decreased hearing is noted.  Nose: Nose normal.    Mouth/Throat: Oropharynx is clear and moist. No oropharyngeal exudate.  Eyes: Conjunctivae and EOM are normal. Pupils are equal, round, and reactive to light. Right eye exhibits no discharge. Left eye exhibits no discharge. No scleral icterus.  Neck: Normal range of motion. Neck supple. No tracheal deviation present. No thyromegaly present.    Cardiovascular: Normal rate, regular rhythm, normal heart sounds and intact distal pulses.  Exam reveals no gallop and no friction rub.   No murmur heard. Pulmonary/Chest: Effort normal and breath sounds normal. No accessory muscle usage. Not tachypneic. No respiratory distress. She has no decreased breath sounds. She has no wheezes. She has no rhonchi. She has no rales. She exhibits no tenderness.  Abdominal: Soft. Bowel sounds are normal. She exhibits no distension and no mass. There is no tenderness. There is no rebound and no guarding.    Musculoskeletal: Normal range of motion. She exhibits no edema and no tenderness.  Lymphadenopathy:    She has no cervical adenopathy.  Neurological: She is alert and oriented to person, place, and time. No cranial nerve deficit. She exhibits normal muscle tone. Coordination normal.  Skin: Skin is warm and dry. No rash noted. She is not diaphoretic. No erythema. No pallor.  Psychiatric: She has a normal mood and affect. Her behavior is normal. Judgment and thought content normal.          Assessment & Plan:   Problem List Items Addressed This Visit     Unprioritized   Basal cell carcinoma of neck     S/p resection by Dr. Phillip Heal.    Cervical spine pain  Continue to follow with Dr. Sharlet Salina.    Chronic kidney disease, stage 5, kidney failure     Tolerating PD well. Continue. Will request recent labs.    Depression     She would like to consider stopping Wellbutrin. However, will wait until after cognitive evaluation complete.    Hypertension - Primary      BP Readings from Last 3 Encounters:   11/14/13 140/80  09/12/13 146/80  05/12/13 140/78   BP generally well controlled. Continue Metoprolol.    Irritable bowel syndrome     IBS with intermittent diarrhea. Encouraged her to use Imodium when traveling outside her home. Follow up prn.    Memory loss     Cognitive testing pending.        Return in about 4 weeks (around 12/12/2013) for Recheck.

## 2013-11-15 ENCOUNTER — Encounter: Payer: Self-pay | Admitting: Neurology

## 2013-11-15 ENCOUNTER — Ambulatory Visit (INDEPENDENT_AMBULATORY_CARE_PROVIDER_SITE_OTHER): Payer: Medicare Other | Admitting: Neurology

## 2013-11-15 VITALS — BP 130/70 | HR 75 | Resp 16 | Ht 64.0 in | Wt 135.0 lb

## 2013-11-15 DIAGNOSIS — R413 Other amnesia: Secondary | ICD-10-CM

## 2013-11-15 NOTE — Patient Instructions (Signed)
1. Schedule head CT without contrast 2. Discuss Aricept with your kidney specialist 3. Follow-up in 6 months

## 2013-11-15 NOTE — Progress Notes (Signed)
NEUROLOGY CONSULTATION NOTE  NASHEA CHUMNEY MRN: [id number] DOB: Feb 18, 1926  Referring provider: Dr. Ronette Deter Primary care provider: Dr. Ronette Deter  Reason for consult:  Memory loss  Dear Dr Gilford Rile:  Thank you for your kind referral of Sharon [name] for consultation of the above symptoms. Although her history is well known to you, please allow me to reiterate it for the purpose of our medical record. Records and images were personally reviewed where available.  HISTORY OF PRESENT ILLNESS: This is a very pleasant 80 year old right-handed woman with a history of hypertension, heart failure s/p pacemaker placement, ESRD on peritoneal dialysis, depression, presenting for evaluation of memory loss that she began to notice a few years ago. Initially symptoms were minor, she would read a book, then afterward could not recall the title or what the book was about until other people started talking about it.  Gradually over the past 6-12 months, she has become more forgetful. She does not put anything on the stove anymore and leave it there. If she starts something in one room, she would forget it when she moves to the next room. The most concerning for her is the word-finding difficulties. She is "losing my words" causing difficulties with conversations. She would forget what she was going to say. She knows what she wants to say but cannot bring the word out or would say a different word.  She has always thought she is sharp but knows she is not anymore. She has limited her driving, denies getting lost, but would occasionally get to a stop sign and briefly forget where she is going. She denies any missed bill payments, occasionally forgets to take her medications.   She denies any headaches, dizziness, diplopia. She has neck problems with difficulties swallowing for the past 3-4 years. She has neck and back pain with plans to start PT tomorrow. Her hands feel weaker. She has occasional  numbness in her fingers. She has had tremors when awakening over the past month. Her balance is bad, she had a fall last month. She denies any constipation but has IBS. She reports that she has never had a good sense of smell. There is no family history of memory loss but her parents died in their 76s and 78s.  Laboratory Data: Lab Results  Component Value Date   [id number] 599 09/12/2013   Lab Results  Component Value Date   TSH 4.68* 09/12/2013     PAST MEDICAL HISTORY: Past Medical History  Diagnosis Date  . Peritoneal dialysis status   . Meniere disease   . Meniere's disease   . Kidney failure July 2012  . Kidney failure     PAST SURGICAL HISTORY: Past Surgical History  Procedure Laterality Date  . Pacemaker insertion  2006  . Cholecystectomy  01/2008  . Total knee arthroplasty  2008    LEFT/Dr Calif  . Cataract extraction  2006, 2011  . Insertion of dialysis catheter  07/2010  . Abdominal adhesion surgery  1958    MEDICATIONS: Current Outpatient Prescriptions on File Prior to Visit  Medication Sig Dispense Refill  . buPROPion (WELLBUTRIN SR) 150 MG 12 hr tablet TAKE 1 BY MOUTH TWO TIMES  DAILY  60 tablet  0  . calcitRIOL (ROCALTROL) 0.25 MCG capsule Take 0.25 mcg by mouth every other day.      . folic acid-vitamin b complex-vitamin c-selenium-zinc (DIALYVITE) 3 MG TABS tablet Take 1 tablet by mouth daily.  30 tablet  5  .  gentamicin ointment (GARAMYCIN) 0.1 %       . metoprolol succinate (TOPROL-XL) 25 MG 24 hr tablet Take 0.5 tablets (12.5 mg total) by mouth daily.  30 tablet  2   No current facility-administered medications on file prior to visit.    ALLERGIES: Allergies  Allergen Reactions  . Statins Other (See Comments)    Muscle weakness  . Codeine Other (See Comments)    GI UPSET    FAMILY HISTORY: Family History  Problem Relation Age of Onset  . Cancer Mother     ? ovarian - sarcoma  . Cancer Father     Skin cancer  . Diabetes Sister   . COPD  Sister   . Depression Sister   . Cancer Sister     Lung - 70 yrs old    SOCIAL HISTORY: History   Social History  . Marital Status: Married    Spouse Name: N/A    Number of Children: 0  . Years of Education: N/A   Occupational History  . Retired     Social History Main Topics  . Smoking status: Former Smoker    Quit date: 03/31/1948  . Smokeless tobacco: Never Used  . Alcohol Use: No  . Drug Use: No  . Sexual Activity: Not on file   Other Topics Concern  . Not on file   Social History Narrative   Lives in [place] alone. Widow 2012.      Regular Exercise -  Housework   Daily Caffeine Use:  1 - 2 cups coffee             REVIEW OF SYSTEMS: Constitutional: No fevers, chills, or sweats, no generalized fatigue, change in appetite Eyes: No visual changes, double vision, eye pain Ear, nose and throat: No hearing loss, ear pain, nasal congestion, sore throat Cardiovascular: No chest pain, palpitations Respiratory:  No shortness of breath at rest or with exertion, wheezes GastrointestinaI: No nausea, vomiting, diarrhea, abdominal pain, fecal incontinence Genitourinary:  No dysuria, urinary retention or frequency Musculoskeletal:  + neck pain, back pain Integumentary: No rash, pruritus, skin lesions Neurological: as above Psychiatric: No depression, insomnia, anxiety Endocrine: No palpitations, fatigue, diaphoresis, mood swings, change in appetite, change in weight, increased thirst Hematologic/Lymphatic:  No anemia, purpura, petechiae. Allergic/Immunologic: no itchy/runny eyes, nasal congestion, recent allergic reactions, rashes  PHYSICAL EXAM: Filed Vitals:   11/15/13 1434  BP: 130/70  Pulse: 75  Resp: 16   General: No acute distress Head:  Normocephalic/atraumatic Eyes: Fundoscopic exam shows bilateral sharp discs, no vessel changes, exudates, or hemorrhages Neck: supple, no paraspinal tenderness, full range of motion Back: No paraspinal tenderness Heart:  regular rate and rhythm Lungs: Clear to auscultation bilaterally. Vascular: No carotid bruits. Skin/Extremities: No rash, no edema Neurological Exam: Mental status: alert and oriented to person, place, and time, no dysarthria or aphasia, Fund of knowledge is appropriate.  Recent and remote memory are intact.  Attention and concentration are normal.    Able to name objects and repeat phrases. Montreal Cognitive Assessment  11/15/2013  Visuospatial/ Executive (0/5) 4  Naming (0/3) 3  Attention: Read list of digits (0/2) 2  Attention: Read list of letters (0/1) 1  Attention: Serial 7 subtraction starting at 100 (0/3) 3  Language: Repeat phrase (0/2) 2  Language : Fluency (0/1) 0  Abstraction (0/2) 2  Delayed Recall (0/5) 5  Orientation (0/6) 6  Total 28   Cranial nerves: CN I: not tested CN II: pupils equal, round and  reactive to light, visual fields intact, fundi unremarkable. CN III, IV, VI:  full range of motion, no nystagmus, no ptosis CN V: facial sensation intact CN VII: upper and lower face symmetric CN VIII: hearing intact to finger rub CN IX, X: gag intact, uvula midline CN XI: sternocleidomastoid and trapezius muscles intact CN XII: tongue midline Bulk & Tone: normal, no cogwheeling, no fasciculations. Motor: 5/5 throughout with no pronator drift. Sensation: intact to light touch, cold, pin, vibration and joint position sense.  No extinction to double simultaneous stimulation.  Romberg test negative Deep Tendon Reflexes: +2 throughout, no ankle clonus Plantar responses: downgoing bilaterally Cerebellar: no incoordination on finger to nose, heel to shin. No dysdiadochokinesia Gait: narrow-based and steady, able to tandem walk adequately. Tremor: none today  IMPRESSION: This is a pleasant 80 year old right-handed woman with a history of hypertension, heart failure s/p pacemaker placement, ESRD on peritoneal dialysis, depression, presenting for worsening memory and  word-finding difficulties more noticeable in the past 6-12 months. Her MOCA score today is normal 28/30, neurological exam non-focal. Symptoms suggestive of mild cognitive impairment. We discussed different etiologies of memory loss, her B12 and TSH are normal. She is unable to obtain MRI due to pacemaker, CT head without contrast will be ordered to assess for underlying structural abnormality. We discussed the option to start low dose cholinesterase inhibitors such as Aricept, as well as side effects and expectations from the medication. She would like to clear this with her nephrologist first.  We discussed the effects of mood on memory, she will continue to monitor depression. The importance of control of vascular risk factors, physical exercise, and brain stimulation exercises for brain health were discussed.  She will follow-up in 6 months.  Thank you for allowing me to participate in the care of this patient. Please do not hesitate to call for any questions or concerns.   Sharon [name], M.D.  CC: Dr. Gilford Rile

## 2013-11-16 DIAGNOSIS — M5412 Radiculopathy, cervical region: Secondary | ICD-10-CM | POA: Diagnosis not present

## 2013-11-17 ENCOUNTER — Encounter: Payer: Self-pay | Admitting: Neurology

## 2013-11-20 DIAGNOSIS — M5412 Radiculopathy, cervical region: Secondary | ICD-10-CM | POA: Diagnosis not present

## 2013-11-22 ENCOUNTER — Ambulatory Visit
Admission: RE | Admit: 2013-11-22 | Discharge: 2013-11-22 | Disposition: A | Discharge status: Home / Self Care | Payer: Medicare Other | Source: Ambulatory Visit | Attending: Neurology | Admitting: Neurology

## 2013-11-22 ENCOUNTER — Other Ambulatory Visit: Payer: Self-pay | Admitting: Internal Medicine

## 2013-11-22 DIAGNOSIS — Z8673 Personal history of transient ischemic attack (TIA), and cerebral infarction without residual deficits: Secondary | ICD-10-CM | POA: Diagnosis not present

## 2013-11-22 DIAGNOSIS — R413 Other amnesia: Secondary | ICD-10-CM | POA: Diagnosis not present

## 2013-11-23 DIAGNOSIS — M5412 Radiculopathy, cervical region: Secondary | ICD-10-CM | POA: Diagnosis not present

## 2013-11-26 DIAGNOSIS — N186 End stage renal disease: Secondary | ICD-10-CM | POA: Diagnosis not present

## 2013-11-26 DIAGNOSIS — Z992 Dependence on renal dialysis: Secondary | ICD-10-CM | POA: Diagnosis not present

## 2013-11-27 DIAGNOSIS — M5412 Radiculopathy, cervical region: Secondary | ICD-10-CM | POA: Diagnosis not present

## 2013-11-27 DIAGNOSIS — Z992 Dependence on renal dialysis: Secondary | ICD-10-CM | POA: Diagnosis not present

## 2013-11-27 DIAGNOSIS — D509 Iron deficiency anemia, unspecified: Secondary | ICD-10-CM | POA: Diagnosis not present

## 2013-11-27 DIAGNOSIS — N186 End stage renal disease: Secondary | ICD-10-CM | POA: Diagnosis not present

## 2013-11-30 DIAGNOSIS — M5412 Radiculopathy, cervical region: Secondary | ICD-10-CM | POA: Diagnosis not present

## 2013-12-04 DIAGNOSIS — M5412 Radiculopathy, cervical region: Secondary | ICD-10-CM | POA: Diagnosis not present

## 2013-12-11 DIAGNOSIS — M5412 Radiculopathy, cervical region: Secondary | ICD-10-CM | POA: Diagnosis not present

## 2013-12-11 DIAGNOSIS — M5030 Other cervical disc degeneration, unspecified cervical region: Secondary | ICD-10-CM | POA: Diagnosis not present

## 2013-12-25 ENCOUNTER — Ambulatory Visit (INDEPENDENT_AMBULATORY_CARE_PROVIDER_SITE_OTHER): Payer: Medicare Other | Admitting: Internal Medicine

## 2013-12-25 ENCOUNTER — Encounter: Payer: Self-pay | Admitting: Internal Medicine

## 2013-12-25 VITALS — BP 150/80 | HR 85 | Temp 97.8°F | Ht 64.5 in | Wt 132.5 lb

## 2013-12-25 DIAGNOSIS — R413 Other amnesia: Secondary | ICD-10-CM

## 2013-12-25 DIAGNOSIS — M545 Low back pain, unspecified: Secondary | ICD-10-CM

## 2013-12-25 DIAGNOSIS — K589 Irritable bowel syndrome without diarrhea: Secondary | ICD-10-CM | POA: Diagnosis not present

## 2013-12-25 MED ORDER — DICYCLOMINE HCL 10 MG PO CAPS: 10.0000 mg | ORAL_CAPSULE | Freq: Three times a day (TID) | ORAL | Status: DC | PRN

## 2013-12-25 NOTE — Progress Notes (Signed)
Subjective:    Patient ID: Sharon [name], female    DOB: 05/12/1926, 80 y.o.   MRN: [id number]  HPI 80YO female presents for follow up.  IBS - Continues to struggle with intermittent diarrhea. Having diarrhea after every meal within 80min. Avoids eating because of this. Losing weight. Previously seen by Dr. Tiffany Kocher in GI at [place]. Pt reports he did not want to pursue colonoscopy because of her age. She is frustrated and would like another opinion. She has tried using Imodium however this leads to severe constipation. She is planning to restart Bentyl again to see if any improvement.No persistent abdominal pain. No NV. No fever, chills, bloody stool.  Memory loss - met with neurology. Discussed using Aricept, but has decided to hold off for now. Reports that memory loss has been relatively stable for over 20 years in her opinion.  Back pain - Seen by Dr. Sharlet Salina. Started on Tramadol. Symptoms have been improved with medication. Using mostly at night. Sleep is improved.  Review of Systems  Constitutional: Negative for fever, chills, appetite change, fatigue and unexpected weight change.  Eyes: Negative for visual disturbance.  Respiratory: Negative for shortness of breath.   Cardiovascular: Negative for chest pain and leg swelling.  Gastrointestinal: Positive for diarrhea. Negative for nausea, vomiting, abdominal pain, constipation, abdominal distention and rectal pain.  Musculoskeletal: Positive for myalgias, back pain and arthralgias.  Skin: Negative for color change and rash.  Hematological: Negative for adenopathy. Does not bruise/bleed easily.  Psychiatric/Behavioral: Negative for confusion, sleep disturbance, dysphoric mood and decreased concentration. The patient is not nervous/anxious.        Objective:    BP 150/80 mmHg  Pulse 85  Temp(Src) 97.8 F (36.6 C) (Oral)  Ht 5' 4.5" (1.638 m)  Wt 132 lb 8 oz (60.102 kg)  BMI 22.40 kg/m2  SpO2 96% Physical Exam    Constitutional: She is oriented to person, place, and time. She appears well-developed and well-nourished. No distress.  HENT:  Head: Normocephalic and atraumatic.  Right Ear: External ear normal.  Left Ear: External ear normal.  Nose: Nose normal.  Mouth/Throat: Oropharynx is clear and moist. No oropharyngeal exudate.  Eyes: Conjunctivae are normal. Pupils are equal, round, and reactive to light. Right eye exhibits no discharge. Left eye exhibits no discharge. No scleral icterus.  Neck: Normal range of motion. Neck supple. No tracheal deviation present. No thyromegaly present.  Cardiovascular: Normal rate, regular rhythm, normal heart sounds and intact distal pulses.  Exam reveals no gallop and no friction rub.   No murmur heard. Pulmonary/Chest: Effort normal and breath sounds normal. No accessory muscle usage. No tachypnea. No respiratory distress. She has no decreased breath sounds. She has no wheezes. She has no rhonchi. She has no rales. She exhibits no tenderness.  Abdominal: Soft. Bowel sounds are normal. She exhibits no distension and no mass. There is no tenderness. There is no guarding.  Musculoskeletal: Normal range of motion. She exhibits no edema or tenderness.  Lymphadenopathy:    She has no cervical adenopathy.  Neurological: She is alert and oriented to person, place, and time. No cranial nerve deficit. She exhibits normal muscle tone. Coordination normal.  Skin: Skin is warm and dry. No rash noted. She is not diaphoretic. No erythema. No pallor.  Psychiatric: She has a normal mood and affect. Her behavior is normal. Judgment and thought content normal.          Assessment & Plan:   Problem List Items Addressed This  Visit      Unprioritized   Irritable bowel syndrome - Primary    Symptoms persistent with daily episodes of diarrhea which have impaired her quality of life. Will restart Bentyl. Discussed adding Lomotil but will hold off given constipation with Imodium.  Will set up second opinion with GI. We discussed the risks of endoscopy at her age. Question if she might benefit from Budesonide in empiric trial to see if any improvement in symptoms.    Relevant Medications      dicyclomine (BENTYL) 10 MG capsule   Other Relevant Orders      Ambulatory referral to Gastroenterology   Low back pain    Low back pain improved with use of Tramadol. Will continue.    Relevant Medications      traMADol (ULTRAM) 50 MG tablet   Memory loss    Memory loss stable. Opted not to start medication such as Aricept. Will continue to monitor symptoms. Follow up with neurology in 6 months and prn.        Return in about 3 months (around 03/26/2014) for Recheck.

## 2013-12-25 NOTE — Assessment & Plan Note (Signed)
Low back pain improved with use of Tramadol. Will continue.

## 2013-12-25 NOTE — Assessment & Plan Note (Signed)
Memory loss stable. Opted not to start medication such as Aricept. Will continue to monitor symptoms. Follow up with neurology in 6 months and prn.

## 2013-12-25 NOTE — Progress Notes (Signed)
Pre visit review using our clinic review tool, if applicable. No additional management support is needed unless otherwise documented below in the visit note. 

## 2013-12-25 NOTE — Patient Instructions (Signed)
We will set up a referral to Dr. Allen Norris in [place].  Start Dicyclomine 10mg  up to three times daily as needed for diarrhea.  Follow up here in 3 months.

## 2013-12-25 NOTE — Assessment & Plan Note (Signed)
Symptoms persistent with daily episodes of diarrhea which have impaired her quality of life. Will restart Bentyl. Discussed adding Lomotil but will hold off given constipation with Imodium. Will set up second opinion with GI. We discussed the risks of endoscopy at her age. Question if she might benefit from Budesonide in empiric trial to see if any improvement in symptoms.

## 2013-12-26 DIAGNOSIS — N186 End stage renal disease: Secondary | ICD-10-CM | POA: Diagnosis not present

## 2013-12-26 DIAGNOSIS — D509 Iron deficiency anemia, unspecified: Secondary | ICD-10-CM | POA: Diagnosis not present

## 2013-12-26 DIAGNOSIS — Z992 Dependence on renal dialysis: Secondary | ICD-10-CM | POA: Diagnosis not present

## 2013-12-28 DIAGNOSIS — Z992 Dependence on renal dialysis: Secondary | ICD-10-CM | POA: Diagnosis not present

## 2013-12-28 DIAGNOSIS — N186 End stage renal disease: Secondary | ICD-10-CM | POA: Diagnosis not present

## 2013-12-28 DIAGNOSIS — D509 Iron deficiency anemia, unspecified: Secondary | ICD-10-CM | POA: Diagnosis not present

## 2013-12-29 DIAGNOSIS — N186 End stage renal disease: Secondary | ICD-10-CM | POA: Diagnosis not present

## 2013-12-29 DIAGNOSIS — D509 Iron deficiency anemia, unspecified: Secondary | ICD-10-CM | POA: Diagnosis not present

## 2013-12-29 DIAGNOSIS — Z992 Dependence on renal dialysis: Secondary | ICD-10-CM | POA: Diagnosis not present

## 2013-12-30 DIAGNOSIS — D509 Iron deficiency anemia, unspecified: Secondary | ICD-10-CM | POA: Diagnosis not present

## 2013-12-30 DIAGNOSIS — N186 End stage renal disease: Secondary | ICD-10-CM | POA: Diagnosis not present

## 2013-12-30 DIAGNOSIS — Z992 Dependence on renal dialysis: Secondary | ICD-10-CM | POA: Diagnosis not present

## 2014-01-01 DIAGNOSIS — N186 End stage renal disease: Secondary | ICD-10-CM | POA: Diagnosis not present

## 2014-01-01 DIAGNOSIS — Z992 Dependence on renal dialysis: Secondary | ICD-10-CM | POA: Diagnosis not present

## 2014-01-01 DIAGNOSIS — D509 Iron deficiency anemia, unspecified: Secondary | ICD-10-CM | POA: Diagnosis not present

## 2014-01-01 DIAGNOSIS — R197 Diarrhea, unspecified: Secondary | ICD-10-CM | POA: Diagnosis not present

## 2014-01-02 DIAGNOSIS — D509 Iron deficiency anemia, unspecified: Secondary | ICD-10-CM | POA: Diagnosis not present

## 2014-01-02 DIAGNOSIS — N186 End stage renal disease: Secondary | ICD-10-CM | POA: Diagnosis not present

## 2014-01-02 DIAGNOSIS — Z992 Dependence on renal dialysis: Secondary | ICD-10-CM | POA: Diagnosis not present

## 2014-01-04 DIAGNOSIS — N186 End stage renal disease: Secondary | ICD-10-CM | POA: Diagnosis not present

## 2014-01-04 DIAGNOSIS — Z992 Dependence on renal dialysis: Secondary | ICD-10-CM | POA: Diagnosis not present

## 2014-01-04 DIAGNOSIS — D509 Iron deficiency anemia, unspecified: Secondary | ICD-10-CM | POA: Diagnosis not present

## 2014-01-05 DIAGNOSIS — D509 Iron deficiency anemia, unspecified: Secondary | ICD-10-CM | POA: Diagnosis not present

## 2014-01-05 DIAGNOSIS — N186 End stage renal disease: Secondary | ICD-10-CM | POA: Diagnosis not present

## 2014-01-05 DIAGNOSIS — Z992 Dependence on renal dialysis: Secondary | ICD-10-CM | POA: Diagnosis not present

## 2014-01-06 DIAGNOSIS — D509 Iron deficiency anemia, unspecified: Secondary | ICD-10-CM | POA: Diagnosis not present

## 2014-01-06 DIAGNOSIS — Z992 Dependence on renal dialysis: Secondary | ICD-10-CM | POA: Diagnosis not present

## 2014-01-06 DIAGNOSIS — N186 End stage renal disease: Secondary | ICD-10-CM | POA: Diagnosis not present

## 2014-01-08 DIAGNOSIS — Z992 Dependence on renal dialysis: Secondary | ICD-10-CM | POA: Diagnosis not present

## 2014-01-08 DIAGNOSIS — D509 Iron deficiency anemia, unspecified: Secondary | ICD-10-CM | POA: Diagnosis not present

## 2014-01-08 DIAGNOSIS — N186 End stage renal disease: Secondary | ICD-10-CM | POA: Diagnosis not present

## 2014-01-09 DIAGNOSIS — Z992 Dependence on renal dialysis: Secondary | ICD-10-CM | POA: Diagnosis not present

## 2014-01-09 DIAGNOSIS — D509 Iron deficiency anemia, unspecified: Secondary | ICD-10-CM | POA: Diagnosis not present

## 2014-01-09 DIAGNOSIS — N186 End stage renal disease: Secondary | ICD-10-CM | POA: Diagnosis not present

## 2014-01-10 ENCOUNTER — Ambulatory Visit: Payer: Self-pay | Admitting: Gastroenterology

## 2014-01-10 DIAGNOSIS — Z992 Dependence on renal dialysis: Secondary | ICD-10-CM | POA: Diagnosis not present

## 2014-01-10 DIAGNOSIS — R160 Hepatomegaly, not elsewhere classified: Secondary | ICD-10-CM | POA: Diagnosis not present

## 2014-01-10 DIAGNOSIS — K5790 Diverticulosis of intestine, part unspecified, without perforation or abscess without bleeding: Secondary | ICD-10-CM | POA: Diagnosis not present

## 2014-01-10 DIAGNOSIS — N289 Disorder of kidney and ureter, unspecified: Secondary | ICD-10-CM | POA: Diagnosis not present

## 2014-01-10 DIAGNOSIS — K5730 Diverticulosis of large intestine without perforation or abscess without bleeding: Secondary | ICD-10-CM | POA: Diagnosis not present

## 2014-01-11 DIAGNOSIS — Z992 Dependence on renal dialysis: Secondary | ICD-10-CM | POA: Diagnosis not present

## 2014-01-11 DIAGNOSIS — D509 Iron deficiency anemia, unspecified: Secondary | ICD-10-CM | POA: Diagnosis not present

## 2014-01-11 DIAGNOSIS — N186 End stage renal disease: Secondary | ICD-10-CM | POA: Diagnosis not present

## 2014-01-12 DIAGNOSIS — D509 Iron deficiency anemia, unspecified: Secondary | ICD-10-CM | POA: Diagnosis not present

## 2014-01-12 DIAGNOSIS — Z992 Dependence on renal dialysis: Secondary | ICD-10-CM | POA: Diagnosis not present

## 2014-01-12 DIAGNOSIS — N186 End stage renal disease: Secondary | ICD-10-CM | POA: Diagnosis not present

## 2014-01-13 DIAGNOSIS — D509 Iron deficiency anemia, unspecified: Secondary | ICD-10-CM | POA: Diagnosis not present

## 2014-01-13 DIAGNOSIS — N186 End stage renal disease: Secondary | ICD-10-CM | POA: Diagnosis not present

## 2014-01-13 DIAGNOSIS — Z992 Dependence on renal dialysis: Secondary | ICD-10-CM | POA: Diagnosis not present

## 2014-01-15 DIAGNOSIS — N185 Chronic kidney disease, stage 5: Secondary | ICD-10-CM | POA: Diagnosis not present

## 2014-01-15 DIAGNOSIS — D509 Iron deficiency anemia, unspecified: Secondary | ICD-10-CM | POA: Diagnosis not present

## 2014-01-15 DIAGNOSIS — E782 Mixed hyperlipidemia: Secondary | ICD-10-CM | POA: Diagnosis not present

## 2014-01-15 DIAGNOSIS — Z992 Dependence on renal dialysis: Secondary | ICD-10-CM | POA: Diagnosis not present

## 2014-01-15 DIAGNOSIS — G4733 Obstructive sleep apnea (adult) (pediatric): Secondary | ICD-10-CM | POA: Insufficient documentation

## 2014-01-15 DIAGNOSIS — N186 End stage renal disease: Secondary | ICD-10-CM | POA: Diagnosis not present

## 2014-01-15 DIAGNOSIS — I5022 Chronic systolic (congestive) heart failure: Secondary | ICD-10-CM | POA: Diagnosis not present

## 2014-01-15 DIAGNOSIS — I2510 Atherosclerotic heart disease of native coronary artery without angina pectoris: Secondary | ICD-10-CM | POA: Insufficient documentation

## 2014-01-16 DIAGNOSIS — D509 Iron deficiency anemia, unspecified: Secondary | ICD-10-CM | POA: Diagnosis not present

## 2014-01-16 DIAGNOSIS — Z992 Dependence on renal dialysis: Secondary | ICD-10-CM | POA: Diagnosis not present

## 2014-01-16 DIAGNOSIS — N186 End stage renal disease: Secondary | ICD-10-CM | POA: Diagnosis not present

## 2014-01-18 DIAGNOSIS — Z992 Dependence on renal dialysis: Secondary | ICD-10-CM | POA: Diagnosis not present

## 2014-01-18 DIAGNOSIS — N186 End stage renal disease: Secondary | ICD-10-CM | POA: Diagnosis not present

## 2014-01-18 DIAGNOSIS — D509 Iron deficiency anemia, unspecified: Secondary | ICD-10-CM | POA: Diagnosis not present

## 2014-01-19 DIAGNOSIS — D509 Iron deficiency anemia, unspecified: Secondary | ICD-10-CM | POA: Diagnosis not present

## 2014-01-19 DIAGNOSIS — Z992 Dependence on renal dialysis: Secondary | ICD-10-CM | POA: Diagnosis not present

## 2014-01-19 DIAGNOSIS — N186 End stage renal disease: Secondary | ICD-10-CM | POA: Diagnosis not present

## 2014-01-20 DIAGNOSIS — N186 End stage renal disease: Secondary | ICD-10-CM | POA: Diagnosis not present

## 2014-01-20 DIAGNOSIS — D509 Iron deficiency anemia, unspecified: Secondary | ICD-10-CM | POA: Diagnosis not present

## 2014-01-20 DIAGNOSIS — Z992 Dependence on renal dialysis: Secondary | ICD-10-CM | POA: Diagnosis not present

## 2014-01-22 DIAGNOSIS — Z992 Dependence on renal dialysis: Secondary | ICD-10-CM | POA: Diagnosis not present

## 2014-01-22 DIAGNOSIS — N186 End stage renal disease: Secondary | ICD-10-CM | POA: Diagnosis not present

## 2014-01-22 DIAGNOSIS — D509 Iron deficiency anemia, unspecified: Secondary | ICD-10-CM | POA: Diagnosis not present

## 2014-01-23 DIAGNOSIS — D509 Iron deficiency anemia, unspecified: Secondary | ICD-10-CM | POA: Diagnosis not present

## 2014-01-23 DIAGNOSIS — N186 End stage renal disease: Secondary | ICD-10-CM | POA: Diagnosis not present

## 2014-01-23 DIAGNOSIS — Z992 Dependence on renal dialysis: Secondary | ICD-10-CM | POA: Diagnosis not present

## 2014-01-25 DIAGNOSIS — Z992 Dependence on renal dialysis: Secondary | ICD-10-CM | POA: Diagnosis not present

## 2014-01-25 DIAGNOSIS — D509 Iron deficiency anemia, unspecified: Secondary | ICD-10-CM | POA: Diagnosis not present

## 2014-01-25 DIAGNOSIS — N186 End stage renal disease: Secondary | ICD-10-CM | POA: Diagnosis not present

## 2014-01-26 DIAGNOSIS — Z992 Dependence on renal dialysis: Secondary | ICD-10-CM | POA: Diagnosis not present

## 2014-01-26 DIAGNOSIS — N186 End stage renal disease: Secondary | ICD-10-CM | POA: Diagnosis not present

## 2014-01-26 DIAGNOSIS — D509 Iron deficiency anemia, unspecified: Secondary | ICD-10-CM | POA: Diagnosis not present

## 2014-01-27 DIAGNOSIS — D509 Iron deficiency anemia, unspecified: Secondary | ICD-10-CM | POA: Diagnosis not present

## 2014-01-27 DIAGNOSIS — N186 End stage renal disease: Secondary | ICD-10-CM | POA: Diagnosis not present

## 2014-01-27 DIAGNOSIS — Z992 Dependence on renal dialysis: Secondary | ICD-10-CM | POA: Diagnosis not present

## 2014-01-29 DIAGNOSIS — N186 End stage renal disease: Secondary | ICD-10-CM | POA: Diagnosis not present

## 2014-01-29 DIAGNOSIS — M5030 Other cervical disc degeneration, unspecified cervical region: Secondary | ICD-10-CM | POA: Diagnosis not present

## 2014-01-29 DIAGNOSIS — M5412 Radiculopathy, cervical region: Secondary | ICD-10-CM | POA: Diagnosis not present

## 2014-01-29 DIAGNOSIS — D509 Iron deficiency anemia, unspecified: Secondary | ICD-10-CM | POA: Diagnosis not present

## 2014-01-29 DIAGNOSIS — Z992 Dependence on renal dialysis: Secondary | ICD-10-CM | POA: Diagnosis not present

## 2014-01-30 DIAGNOSIS — Z992 Dependence on renal dialysis: Secondary | ICD-10-CM | POA: Diagnosis not present

## 2014-01-30 DIAGNOSIS — N186 End stage renal disease: Secondary | ICD-10-CM | POA: Diagnosis not present

## 2014-01-30 DIAGNOSIS — D509 Iron deficiency anemia, unspecified: Secondary | ICD-10-CM | POA: Diagnosis not present

## 2014-01-31 DIAGNOSIS — N186 End stage renal disease: Secondary | ICD-10-CM | POA: Diagnosis not present

## 2014-01-31 DIAGNOSIS — Z992 Dependence on renal dialysis: Secondary | ICD-10-CM | POA: Diagnosis not present

## 2014-01-31 DIAGNOSIS — D509 Iron deficiency anemia, unspecified: Secondary | ICD-10-CM | POA: Diagnosis not present

## 2014-02-01 DIAGNOSIS — N186 End stage renal disease: Secondary | ICD-10-CM | POA: Diagnosis not present

## 2014-02-01 DIAGNOSIS — D509 Iron deficiency anemia, unspecified: Secondary | ICD-10-CM | POA: Diagnosis not present

## 2014-02-01 DIAGNOSIS — Z992 Dependence on renal dialysis: Secondary | ICD-10-CM | POA: Diagnosis not present

## 2014-02-02 DIAGNOSIS — Z992 Dependence on renal dialysis: Secondary | ICD-10-CM | POA: Diagnosis not present

## 2014-02-02 DIAGNOSIS — D509 Iron deficiency anemia, unspecified: Secondary | ICD-10-CM | POA: Diagnosis not present

## 2014-02-02 DIAGNOSIS — N186 End stage renal disease: Secondary | ICD-10-CM | POA: Diagnosis not present

## 2014-02-03 DIAGNOSIS — Z992 Dependence on renal dialysis: Secondary | ICD-10-CM | POA: Diagnosis not present

## 2014-02-03 DIAGNOSIS — D509 Iron deficiency anemia, unspecified: Secondary | ICD-10-CM | POA: Diagnosis not present

## 2014-02-03 DIAGNOSIS — N186 End stage renal disease: Secondary | ICD-10-CM | POA: Diagnosis not present

## 2014-02-05 DIAGNOSIS — N186 End stage renal disease: Secondary | ICD-10-CM | POA: Diagnosis not present

## 2014-02-05 DIAGNOSIS — Z992 Dependence on renal dialysis: Secondary | ICD-10-CM | POA: Diagnosis not present

## 2014-02-05 DIAGNOSIS — D509 Iron deficiency anemia, unspecified: Secondary | ICD-10-CM | POA: Diagnosis not present

## 2014-02-06 ENCOUNTER — Other Ambulatory Visit: Payer: Self-pay | Admitting: *Deleted

## 2014-02-06 DIAGNOSIS — Z992 Dependence on renal dialysis: Secondary | ICD-10-CM | POA: Diagnosis not present

## 2014-02-06 DIAGNOSIS — D509 Iron deficiency anemia, unspecified: Secondary | ICD-10-CM | POA: Diagnosis not present

## 2014-02-06 DIAGNOSIS — N186 End stage renal disease: Secondary | ICD-10-CM | POA: Diagnosis not present

## 2014-02-06 MED ORDER — BUPROPION HCL ER (SR) 150 MG PO TB12: ORAL_TABLET | ORAL | Status: DC

## 2014-02-08 DIAGNOSIS — N186 End stage renal disease: Secondary | ICD-10-CM | POA: Diagnosis not present

## 2014-02-08 DIAGNOSIS — D509 Iron deficiency anemia, unspecified: Secondary | ICD-10-CM | POA: Diagnosis not present

## 2014-02-08 DIAGNOSIS — Z992 Dependence on renal dialysis: Secondary | ICD-10-CM | POA: Diagnosis not present

## 2014-02-09 DIAGNOSIS — N186 End stage renal disease: Secondary | ICD-10-CM | POA: Diagnosis not present

## 2014-02-09 DIAGNOSIS — D509 Iron deficiency anemia, unspecified: Secondary | ICD-10-CM | POA: Diagnosis not present

## 2014-02-09 DIAGNOSIS — Z992 Dependence on renal dialysis: Secondary | ICD-10-CM | POA: Diagnosis not present

## 2014-02-10 DIAGNOSIS — Z992 Dependence on renal dialysis: Secondary | ICD-10-CM | POA: Diagnosis not present

## 2014-02-10 DIAGNOSIS — N186 End stage renal disease: Secondary | ICD-10-CM | POA: Diagnosis not present

## 2014-02-10 DIAGNOSIS — D509 Iron deficiency anemia, unspecified: Secondary | ICD-10-CM | POA: Diagnosis not present

## 2014-02-12 DIAGNOSIS — Z992 Dependence on renal dialysis: Secondary | ICD-10-CM | POA: Diagnosis not present

## 2014-02-12 DIAGNOSIS — N186 End stage renal disease: Secondary | ICD-10-CM | POA: Diagnosis not present

## 2014-02-12 DIAGNOSIS — D509 Iron deficiency anemia, unspecified: Secondary | ICD-10-CM | POA: Diagnosis not present

## 2014-02-13 DIAGNOSIS — Z992 Dependence on renal dialysis: Secondary | ICD-10-CM | POA: Diagnosis not present

## 2014-02-13 DIAGNOSIS — D509 Iron deficiency anemia, unspecified: Secondary | ICD-10-CM | POA: Diagnosis not present

## 2014-02-13 DIAGNOSIS — N186 End stage renal disease: Secondary | ICD-10-CM | POA: Diagnosis not present

## 2014-02-15 ENCOUNTER — Ambulatory Visit: Payer: Self-pay | Admitting: Gastroenterology

## 2014-02-15 DIAGNOSIS — I10 Essential (primary) hypertension: Secondary | ICD-10-CM | POA: Diagnosis not present

## 2014-02-15 DIAGNOSIS — D509 Iron deficiency anemia, unspecified: Secondary | ICD-10-CM | POA: Diagnosis not present

## 2014-02-15 DIAGNOSIS — N186 End stage renal disease: Secondary | ICD-10-CM | POA: Diagnosis not present

## 2014-02-15 DIAGNOSIS — K648 Other hemorrhoids: Secondary | ICD-10-CM | POA: Diagnosis not present

## 2014-02-15 DIAGNOSIS — G4730 Sleep apnea, unspecified: Secondary | ICD-10-CM | POA: Diagnosis not present

## 2014-02-15 DIAGNOSIS — M549 Dorsalgia, unspecified: Secondary | ICD-10-CM | POA: Diagnosis not present

## 2014-02-15 DIAGNOSIS — Z992 Dependence on renal dialysis: Secondary | ICD-10-CM | POA: Diagnosis not present

## 2014-02-15 DIAGNOSIS — E119 Type 2 diabetes mellitus without complications: Secondary | ICD-10-CM | POA: Diagnosis not present

## 2014-02-15 DIAGNOSIS — Z79899 Other long term (current) drug therapy: Secondary | ICD-10-CM | POA: Diagnosis not present

## 2014-02-15 DIAGNOSIS — Z950 Presence of cardiac pacemaker: Secondary | ICD-10-CM | POA: Diagnosis not present

## 2014-02-15 DIAGNOSIS — Z87891 Personal history of nicotine dependence: Secondary | ICD-10-CM | POA: Diagnosis not present

## 2014-02-15 DIAGNOSIS — M1990 Unspecified osteoarthritis, unspecified site: Secondary | ICD-10-CM | POA: Diagnosis not present

## 2014-02-15 DIAGNOSIS — K589 Irritable bowel syndrome without diarrhea: Secondary | ICD-10-CM | POA: Diagnosis not present

## 2014-02-15 DIAGNOSIS — Z885 Allergy status to narcotic agent status: Secondary | ICD-10-CM | POA: Diagnosis not present

## 2014-02-15 DIAGNOSIS — I509 Heart failure, unspecified: Secondary | ICD-10-CM | POA: Diagnosis not present

## 2014-02-15 DIAGNOSIS — R197 Diarrhea, unspecified: Secondary | ICD-10-CM | POA: Diagnosis not present

## 2014-02-15 DIAGNOSIS — K5730 Diverticulosis of large intestine without perforation or abscess without bleeding: Secondary | ICD-10-CM | POA: Diagnosis not present

## 2014-02-15 DIAGNOSIS — G309 Alzheimer's disease, unspecified: Secondary | ICD-10-CM | POA: Diagnosis not present

## 2014-02-15 DIAGNOSIS — F0280 Dementia in other diseases classified elsewhere without behavioral disturbance: Secondary | ICD-10-CM | POA: Diagnosis not present

## 2014-02-16 DIAGNOSIS — N186 End stage renal disease: Secondary | ICD-10-CM | POA: Diagnosis not present

## 2014-02-16 DIAGNOSIS — Z992 Dependence on renal dialysis: Secondary | ICD-10-CM | POA: Diagnosis not present

## 2014-02-16 DIAGNOSIS — D509 Iron deficiency anemia, unspecified: Secondary | ICD-10-CM | POA: Diagnosis not present

## 2014-02-17 DIAGNOSIS — N186 End stage renal disease: Secondary | ICD-10-CM | POA: Diagnosis not present

## 2014-02-17 DIAGNOSIS — D509 Iron deficiency anemia, unspecified: Secondary | ICD-10-CM | POA: Diagnosis not present

## 2014-02-17 DIAGNOSIS — Z992 Dependence on renal dialysis: Secondary | ICD-10-CM | POA: Diagnosis not present

## 2014-02-19 DIAGNOSIS — N186 End stage renal disease: Secondary | ICD-10-CM | POA: Diagnosis not present

## 2014-02-19 DIAGNOSIS — Z992 Dependence on renal dialysis: Secondary | ICD-10-CM | POA: Diagnosis not present

## 2014-02-19 DIAGNOSIS — D509 Iron deficiency anemia, unspecified: Secondary | ICD-10-CM | POA: Diagnosis not present

## 2014-02-20 DIAGNOSIS — N186 End stage renal disease: Secondary | ICD-10-CM | POA: Diagnosis not present

## 2014-02-20 DIAGNOSIS — Z992 Dependence on renal dialysis: Secondary | ICD-10-CM | POA: Diagnosis not present

## 2014-02-20 DIAGNOSIS — D509 Iron deficiency anemia, unspecified: Secondary | ICD-10-CM | POA: Diagnosis not present

## 2014-02-22 DIAGNOSIS — D509 Iron deficiency anemia, unspecified: Secondary | ICD-10-CM | POA: Diagnosis not present

## 2014-02-22 DIAGNOSIS — N186 End stage renal disease: Secondary | ICD-10-CM | POA: Diagnosis not present

## 2014-02-22 DIAGNOSIS — Z992 Dependence on renal dialysis: Secondary | ICD-10-CM | POA: Diagnosis not present

## 2014-02-23 DIAGNOSIS — Z992 Dependence on renal dialysis: Secondary | ICD-10-CM | POA: Diagnosis not present

## 2014-02-23 DIAGNOSIS — N186 End stage renal disease: Secondary | ICD-10-CM | POA: Diagnosis not present

## 2014-02-23 DIAGNOSIS — D509 Iron deficiency anemia, unspecified: Secondary | ICD-10-CM | POA: Diagnosis not present

## 2014-02-24 DIAGNOSIS — Z992 Dependence on renal dialysis: Secondary | ICD-10-CM | POA: Diagnosis not present

## 2014-02-24 DIAGNOSIS — D509 Iron deficiency anemia, unspecified: Secondary | ICD-10-CM | POA: Diagnosis not present

## 2014-02-24 DIAGNOSIS — N186 End stage renal disease: Secondary | ICD-10-CM | POA: Diagnosis not present

## 2014-02-26 DIAGNOSIS — N2581 Secondary hyperparathyroidism of renal origin: Secondary | ICD-10-CM | POA: Diagnosis not present

## 2014-02-26 DIAGNOSIS — Z992 Dependence on renal dialysis: Secondary | ICD-10-CM | POA: Diagnosis not present

## 2014-02-26 DIAGNOSIS — N186 End stage renal disease: Secondary | ICD-10-CM | POA: Diagnosis not present

## 2014-02-26 DIAGNOSIS — D509 Iron deficiency anemia, unspecified: Secondary | ICD-10-CM | POA: Diagnosis not present

## 2014-02-27 DIAGNOSIS — D509 Iron deficiency anemia, unspecified: Secondary | ICD-10-CM | POA: Diagnosis not present

## 2014-02-27 DIAGNOSIS — N2581 Secondary hyperparathyroidism of renal origin: Secondary | ICD-10-CM | POA: Diagnosis not present

## 2014-02-27 DIAGNOSIS — Z992 Dependence on renal dialysis: Secondary | ICD-10-CM | POA: Diagnosis not present

## 2014-02-27 DIAGNOSIS — N186 End stage renal disease: Secondary | ICD-10-CM | POA: Diagnosis not present

## 2014-02-28 DIAGNOSIS — N186 End stage renal disease: Secondary | ICD-10-CM | POA: Diagnosis not present

## 2014-02-28 DIAGNOSIS — D509 Iron deficiency anemia, unspecified: Secondary | ICD-10-CM | POA: Diagnosis not present

## 2014-02-28 DIAGNOSIS — Z992 Dependence on renal dialysis: Secondary | ICD-10-CM | POA: Diagnosis not present

## 2014-02-28 DIAGNOSIS — N2581 Secondary hyperparathyroidism of renal origin: Secondary | ICD-10-CM | POA: Diagnosis not present

## 2014-03-01 DIAGNOSIS — N186 End stage renal disease: Secondary | ICD-10-CM | POA: Diagnosis not present

## 2014-03-01 DIAGNOSIS — Z992 Dependence on renal dialysis: Secondary | ICD-10-CM | POA: Diagnosis not present

## 2014-03-01 DIAGNOSIS — D509 Iron deficiency anemia, unspecified: Secondary | ICD-10-CM | POA: Diagnosis not present

## 2014-03-01 DIAGNOSIS — N2581 Secondary hyperparathyroidism of renal origin: Secondary | ICD-10-CM | POA: Diagnosis not present

## 2014-03-02 DIAGNOSIS — N186 End stage renal disease: Secondary | ICD-10-CM | POA: Diagnosis not present

## 2014-03-02 DIAGNOSIS — N2581 Secondary hyperparathyroidism of renal origin: Secondary | ICD-10-CM | POA: Diagnosis not present

## 2014-03-02 DIAGNOSIS — D509 Iron deficiency anemia, unspecified: Secondary | ICD-10-CM | POA: Diagnosis not present

## 2014-03-02 DIAGNOSIS — Z992 Dependence on renal dialysis: Secondary | ICD-10-CM | POA: Diagnosis not present

## 2014-03-03 DIAGNOSIS — Z992 Dependence on renal dialysis: Secondary | ICD-10-CM | POA: Diagnosis not present

## 2014-03-03 DIAGNOSIS — D509 Iron deficiency anemia, unspecified: Secondary | ICD-10-CM | POA: Diagnosis not present

## 2014-03-03 DIAGNOSIS — N2581 Secondary hyperparathyroidism of renal origin: Secondary | ICD-10-CM | POA: Diagnosis not present

## 2014-03-03 DIAGNOSIS — N186 End stage renal disease: Secondary | ICD-10-CM | POA: Diagnosis not present

## 2014-03-05 DIAGNOSIS — N2581 Secondary hyperparathyroidism of renal origin: Secondary | ICD-10-CM | POA: Diagnosis not present

## 2014-03-05 DIAGNOSIS — D509 Iron deficiency anemia, unspecified: Secondary | ICD-10-CM | POA: Diagnosis not present

## 2014-03-05 DIAGNOSIS — N186 End stage renal disease: Secondary | ICD-10-CM | POA: Diagnosis not present

## 2014-03-05 DIAGNOSIS — Z992 Dependence on renal dialysis: Secondary | ICD-10-CM | POA: Diagnosis not present

## 2014-03-06 DIAGNOSIS — N2581 Secondary hyperparathyroidism of renal origin: Secondary | ICD-10-CM | POA: Diagnosis not present

## 2014-03-06 DIAGNOSIS — Z992 Dependence on renal dialysis: Secondary | ICD-10-CM | POA: Diagnosis not present

## 2014-03-06 DIAGNOSIS — N186 End stage renal disease: Secondary | ICD-10-CM | POA: Diagnosis not present

## 2014-03-06 DIAGNOSIS — D509 Iron deficiency anemia, unspecified: Secondary | ICD-10-CM | POA: Diagnosis not present

## 2014-03-08 DIAGNOSIS — D509 Iron deficiency anemia, unspecified: Secondary | ICD-10-CM | POA: Diagnosis not present

## 2014-03-08 DIAGNOSIS — N2581 Secondary hyperparathyroidism of renal origin: Secondary | ICD-10-CM | POA: Diagnosis not present

## 2014-03-08 DIAGNOSIS — N186 End stage renal disease: Secondary | ICD-10-CM | POA: Diagnosis not present

## 2014-03-08 DIAGNOSIS — Z992 Dependence on renal dialysis: Secondary | ICD-10-CM | POA: Diagnosis not present

## 2014-03-09 DIAGNOSIS — D509 Iron deficiency anemia, unspecified: Secondary | ICD-10-CM | POA: Diagnosis not present

## 2014-03-09 DIAGNOSIS — N186 End stage renal disease: Secondary | ICD-10-CM | POA: Diagnosis not present

## 2014-03-09 DIAGNOSIS — Z992 Dependence on renal dialysis: Secondary | ICD-10-CM | POA: Diagnosis not present

## 2014-03-09 DIAGNOSIS — N2581 Secondary hyperparathyroidism of renal origin: Secondary | ICD-10-CM | POA: Diagnosis not present

## 2014-03-10 DIAGNOSIS — Z992 Dependence on renal dialysis: Secondary | ICD-10-CM | POA: Diagnosis not present

## 2014-03-10 DIAGNOSIS — D509 Iron deficiency anemia, unspecified: Secondary | ICD-10-CM | POA: Diagnosis not present

## 2014-03-10 DIAGNOSIS — N186 End stage renal disease: Secondary | ICD-10-CM | POA: Diagnosis not present

## 2014-03-10 DIAGNOSIS — N2581 Secondary hyperparathyroidism of renal origin: Secondary | ICD-10-CM | POA: Diagnosis not present

## 2014-03-12 DIAGNOSIS — Z992 Dependence on renal dialysis: Secondary | ICD-10-CM | POA: Diagnosis not present

## 2014-03-12 DIAGNOSIS — N186 End stage renal disease: Secondary | ICD-10-CM | POA: Diagnosis not present

## 2014-03-12 DIAGNOSIS — D509 Iron deficiency anemia, unspecified: Secondary | ICD-10-CM | POA: Diagnosis not present

## 2014-03-12 DIAGNOSIS — N2581 Secondary hyperparathyroidism of renal origin: Secondary | ICD-10-CM | POA: Diagnosis not present

## 2014-03-13 ENCOUNTER — Encounter: Payer: Self-pay | Admitting: Internal Medicine

## 2014-03-13 ENCOUNTER — Ambulatory Visit (INDEPENDENT_AMBULATORY_CARE_PROVIDER_SITE_OTHER): Payer: Medicare Other | Admitting: Internal Medicine

## 2014-03-13 VITALS — BP 138/70 | HR 80 | Temp 98.4°F | Ht 64.5 in | Wt 130.0 lb

## 2014-03-13 DIAGNOSIS — N186 End stage renal disease: Secondary | ICD-10-CM | POA: Diagnosis not present

## 2014-03-13 DIAGNOSIS — G4700 Insomnia, unspecified: Secondary | ICD-10-CM | POA: Insufficient documentation

## 2014-03-13 DIAGNOSIS — D509 Iron deficiency anemia, unspecified: Secondary | ICD-10-CM | POA: Diagnosis not present

## 2014-03-13 DIAGNOSIS — F411 Generalized anxiety disorder: Secondary | ICD-10-CM | POA: Insufficient documentation

## 2014-03-13 DIAGNOSIS — Z992 Dependence on renal dialysis: Secondary | ICD-10-CM | POA: Diagnosis not present

## 2014-03-13 DIAGNOSIS — J069 Acute upper respiratory infection, unspecified: Secondary | ICD-10-CM | POA: Diagnosis not present

## 2014-03-13 DIAGNOSIS — B9789 Other viral agents as the cause of diseases classified elsewhere: Secondary | ICD-10-CM

## 2014-03-13 DIAGNOSIS — N2581 Secondary hyperparathyroidism of renal origin: Secondary | ICD-10-CM | POA: Diagnosis not present

## 2014-03-13 MED ORDER — CLONAZEPAM 0.25 MG PO TBDP
0.2500 mg | ORAL_TABLET | Freq: Every day | ORAL | Status: DC
Start: 2014-03-13 — End: 2014-05-22

## 2014-03-13 MED ORDER — SERTRALINE HCL 50 MG PO TABS: ORAL_TABLET | ORAL | Status: DC

## 2014-03-13 NOTE — Assessment & Plan Note (Signed)
Insomnia related to anxiety. Will start Sertraline at bedtime 25mg  daily x 1 week, then 50mg  daily after that. Will add Clonazepam prn at bedtime for anxiety. Discussed potential risks of this medication. Follow up in 2 weeks and prn.

## 2014-03-13 NOTE — Assessment & Plan Note (Signed)
Symptoms and exam c/w viral URI. Encouraged rest, adequate fluids. Tylenol prn pain. Robitussin prn cough. Follow up recheck in 2 weeks.

## 2014-03-13 NOTE — Patient Instructions (Addendum)
Stop Wellbutrin.  Start Zoloft (Sertraline) 25mg  daily at bedtime for 1 week, then increase to 50mg  daily.  Start Clonzepam 0.25mg  daily at bedtime as needed to help with anxiety.  Follow up in 1 week.

## 2014-03-13 NOTE — Progress Notes (Signed)
Subjective:    Patient ID: Sharon [name], female    DOB: [birth year], 81 y.o.   MRN: [id number]  HPI 81YO female presents for acute visit.  Feels exhausted because waking frequently at night with panic attacks. Feels short of breath during these episodes. Symptoms started almost one year ago. Felt claustrophobic during procedure and this seemed to trigger these attacks. Progressively getting worse.   Last night, developed dry cough. Last week had some fever and chills, but this resolved. Generally does not feel well. Fatigued. Thinks she would feel better if she had some rest.  Past medical, surgical, family and social history per today's encounter.  Review of Systems  Constitutional: Positive for fatigue. Negative for fever, chills, appetite change and unexpected weight change.  Eyes: Negative for visual disturbance.  Respiratory: Negative for shortness of breath.   Cardiovascular: Negative for chest pain and leg swelling.  Gastrointestinal: Negative for nausea, vomiting, abdominal pain, diarrhea and constipation.  Musculoskeletal: Negative for myalgias and arthralgias.  Skin: Negative for color change and rash.  Hematological: Negative for adenopathy. Does not bruise/bleed easily.  Psychiatric/Behavioral: Positive for sleep disturbance and dysphoric mood. Negative for suicidal ideas. The patient is nervous/anxious.        Objective:    BP 138/70 mmHg  Pulse 80  Temp(Src) 98.4 F (36.9 C) (Oral)  Ht 5' 4.5" (1.638 m)  Wt 130 lb (58.968 kg)  BMI 21.98 kg/m2  SpO2 100% Physical Exam  Constitutional: She is oriented to person, place, and time. She appears well-developed and well-nourished. No distress.  HENT:  Head: Normocephalic and atraumatic.  Right Ear: External ear normal. Decreased hearing is noted.  Left Ear: External ear normal. Decreased hearing is noted.  Nose: Nose normal.  Mouth/Throat: Oropharynx is clear and moist. No oropharyngeal exudate.  Eyes:  Conjunctivae are normal. Pupils are equal, round, and reactive to light. Right eye exhibits no discharge. Left eye exhibits no discharge. No scleral icterus.  Neck: Normal range of motion. Neck supple. No tracheal deviation present. No thyromegaly present.  Cardiovascular: Normal rate, regular rhythm, normal heart sounds and intact distal pulses.  Exam reveals no gallop and no friction rub.   No murmur heard. Pulmonary/Chest: Effort normal and breath sounds normal. No accessory muscle usage. No respiratory distress. She has no decreased breath sounds. She has no wheezes. She has no rhonchi. She has no rales. She exhibits no tenderness.  Musculoskeletal: Normal range of motion. She exhibits no edema or tenderness.  Lymphadenopathy:    She has no cervical adenopathy.  Neurological: She is alert and oriented to person, place, and time. No cranial nerve deficit. She exhibits normal muscle tone. Coordination normal.  Skin: Skin is warm and dry. No rash noted. She is not diaphoretic. No erythema. No pallor.  Psychiatric: Her speech is normal and behavior is normal. Judgment and thought content normal. Her mood appears anxious. Cognition and memory are normal. She exhibits a depressed mood. She expresses no suicidal ideation.          Assessment & Plan:   Problem List Items Addressed This Visit      Unprioritized   Anxiety state    Worsening symptoms of anxiety after recent procedure in which she felt claustrophobic. Will start Sertraline which worked well for her in the past. She would like to stop Wellbutrin, so will discontinue. Will add Clonazepam prn anxiety at bedtime. Recheck 2 weeks and prn.      Relevant Medications   sertraline (ZOLOFT) tablet  clonazePAM (KLONOPIN) disintegrating tablet   Insomnia - Primary    Insomnia related to anxiety. Will start Sertraline at bedtime 25mg  daily x 1 week, then 50mg  daily after that. Will add Clonazepam prn at bedtime for anxiety. Discussed  potential risks of this medication. Follow up in 2 weeks and prn.      Viral URI with cough    Symptoms and exam c/w viral URI. Encouraged rest, adequate fluids. Tylenol prn pain. Robitussin prn cough. Follow up recheck in 2 weeks.          Return in about 1 week (around 03/20/2014) for Recheck.

## 2014-03-13 NOTE — Assessment & Plan Note (Signed)
Worsening symptoms of anxiety after recent procedure in which she felt claustrophobic. Will start Sertraline which worked well for her in the past. She would like to stop Wellbutrin, so will discontinue. Will add Clonazepam prn anxiety at bedtime. Recheck 2 weeks and prn.

## 2014-03-13 NOTE — Progress Notes (Signed)
Pre visit review using our clinic review tool, if applicable. No additional management support is needed unless otherwise documented below in the visit note. 

## 2014-03-15 DIAGNOSIS — N2581 Secondary hyperparathyroidism of renal origin: Secondary | ICD-10-CM | POA: Diagnosis not present

## 2014-03-15 DIAGNOSIS — D509 Iron deficiency anemia, unspecified: Secondary | ICD-10-CM | POA: Diagnosis not present

## 2014-03-15 DIAGNOSIS — N186 End stage renal disease: Secondary | ICD-10-CM | POA: Diagnosis not present

## 2014-03-15 DIAGNOSIS — Z992 Dependence on renal dialysis: Secondary | ICD-10-CM | POA: Diagnosis not present

## 2014-03-16 DIAGNOSIS — D509 Iron deficiency anemia, unspecified: Secondary | ICD-10-CM | POA: Diagnosis not present

## 2014-03-16 DIAGNOSIS — Z992 Dependence on renal dialysis: Secondary | ICD-10-CM | POA: Diagnosis not present

## 2014-03-16 DIAGNOSIS — N186 End stage renal disease: Secondary | ICD-10-CM | POA: Diagnosis not present

## 2014-03-16 DIAGNOSIS — N2581 Secondary hyperparathyroidism of renal origin: Secondary | ICD-10-CM | POA: Diagnosis not present

## 2014-03-17 DIAGNOSIS — D509 Iron deficiency anemia, unspecified: Secondary | ICD-10-CM | POA: Diagnosis not present

## 2014-03-17 DIAGNOSIS — N2581 Secondary hyperparathyroidism of renal origin: Secondary | ICD-10-CM | POA: Diagnosis not present

## 2014-03-17 DIAGNOSIS — Z992 Dependence on renal dialysis: Secondary | ICD-10-CM | POA: Diagnosis not present

## 2014-03-17 DIAGNOSIS — N186 End stage renal disease: Secondary | ICD-10-CM | POA: Diagnosis not present

## 2014-03-19 DIAGNOSIS — Z992 Dependence on renal dialysis: Secondary | ICD-10-CM | POA: Diagnosis not present

## 2014-03-19 DIAGNOSIS — N186 End stage renal disease: Secondary | ICD-10-CM | POA: Diagnosis not present

## 2014-03-19 DIAGNOSIS — D509 Iron deficiency anemia, unspecified: Secondary | ICD-10-CM | POA: Diagnosis not present

## 2014-03-19 DIAGNOSIS — N2581 Secondary hyperparathyroidism of renal origin: Secondary | ICD-10-CM | POA: Diagnosis not present

## 2014-03-20 DIAGNOSIS — D509 Iron deficiency anemia, unspecified: Secondary | ICD-10-CM | POA: Diagnosis not present

## 2014-03-20 DIAGNOSIS — N186 End stage renal disease: Secondary | ICD-10-CM | POA: Diagnosis not present

## 2014-03-20 DIAGNOSIS — N2581 Secondary hyperparathyroidism of renal origin: Secondary | ICD-10-CM | POA: Diagnosis not present

## 2014-03-20 DIAGNOSIS — Z992 Dependence on renal dialysis: Secondary | ICD-10-CM | POA: Diagnosis not present

## 2014-03-22 DIAGNOSIS — Z992 Dependence on renal dialysis: Secondary | ICD-10-CM | POA: Diagnosis not present

## 2014-03-22 DIAGNOSIS — D509 Iron deficiency anemia, unspecified: Secondary | ICD-10-CM | POA: Diagnosis not present

## 2014-03-22 DIAGNOSIS — N2581 Secondary hyperparathyroidism of renal origin: Secondary | ICD-10-CM | POA: Diagnosis not present

## 2014-03-22 DIAGNOSIS — N186 End stage renal disease: Secondary | ICD-10-CM | POA: Diagnosis not present

## 2014-03-23 ENCOUNTER — Emergency Department: Payer: Self-pay | Admitting: Student

## 2014-03-23 DIAGNOSIS — J069 Acute upper respiratory infection, unspecified: Secondary | ICD-10-CM | POA: Diagnosis not present

## 2014-03-23 DIAGNOSIS — N186 End stage renal disease: Secondary | ICD-10-CM | POA: Diagnosis not present

## 2014-03-23 DIAGNOSIS — R0789 Other chest pain: Secondary | ICD-10-CM | POA: Diagnosis not present

## 2014-03-23 DIAGNOSIS — R05 Cough: Secondary | ICD-10-CM | POA: Diagnosis not present

## 2014-03-23 DIAGNOSIS — D509 Iron deficiency anemia, unspecified: Secondary | ICD-10-CM | POA: Diagnosis not present

## 2014-03-23 DIAGNOSIS — R0689 Other abnormalities of breathing: Secondary | ICD-10-CM | POA: Diagnosis not present

## 2014-03-23 DIAGNOSIS — R062 Wheezing: Secondary | ICD-10-CM | POA: Diagnosis not present

## 2014-03-23 DIAGNOSIS — J80 Acute respiratory distress syndrome: Secondary | ICD-10-CM | POA: Diagnosis not present

## 2014-03-23 DIAGNOSIS — R0602 Shortness of breath: Secondary | ICD-10-CM | POA: Diagnosis not present

## 2014-03-23 DIAGNOSIS — Z992 Dependence on renal dialysis: Secondary | ICD-10-CM | POA: Diagnosis not present

## 2014-03-23 DIAGNOSIS — J029 Acute pharyngitis, unspecified: Secondary | ICD-10-CM | POA: Diagnosis not present

## 2014-03-23 DIAGNOSIS — Z79899 Other long term (current) drug therapy: Secondary | ICD-10-CM | POA: Diagnosis not present

## 2014-03-23 DIAGNOSIS — N2581 Secondary hyperparathyroidism of renal origin: Secondary | ICD-10-CM | POA: Diagnosis not present

## 2014-03-24 DIAGNOSIS — D509 Iron deficiency anemia, unspecified: Secondary | ICD-10-CM | POA: Diagnosis not present

## 2014-03-24 DIAGNOSIS — Z992 Dependence on renal dialysis: Secondary | ICD-10-CM | POA: Diagnosis not present

## 2014-03-24 DIAGNOSIS — N186 End stage renal disease: Secondary | ICD-10-CM | POA: Diagnosis not present

## 2014-03-24 DIAGNOSIS — N2581 Secondary hyperparathyroidism of renal origin: Secondary | ICD-10-CM | POA: Diagnosis not present

## 2014-03-26 ENCOUNTER — Ambulatory Visit (INDEPENDENT_AMBULATORY_CARE_PROVIDER_SITE_OTHER): Payer: Medicare Other | Admitting: Internal Medicine

## 2014-03-26 ENCOUNTER — Encounter: Payer: Self-pay | Admitting: Internal Medicine

## 2014-03-26 ENCOUNTER — Telehealth: Payer: Self-pay | Admitting: *Deleted

## 2014-03-26 VITALS — BP 148/72 | HR 83 | Temp 98.0°F | Ht 64.5 in | Wt 125.4 lb

## 2014-03-26 DIAGNOSIS — I10 Essential (primary) hypertension: Secondary | ICD-10-CM | POA: Diagnosis not present

## 2014-03-26 DIAGNOSIS — N185 Chronic kidney disease, stage 5: Secondary | ICD-10-CM | POA: Diagnosis not present

## 2014-03-26 DIAGNOSIS — J209 Acute bronchitis, unspecified: Secondary | ICD-10-CM

## 2014-03-26 DIAGNOSIS — F411 Generalized anxiety disorder: Secondary | ICD-10-CM

## 2014-03-26 DIAGNOSIS — N2581 Secondary hyperparathyroidism of renal origin: Secondary | ICD-10-CM | POA: Diagnosis not present

## 2014-03-26 DIAGNOSIS — D509 Iron deficiency anemia, unspecified: Secondary | ICD-10-CM | POA: Diagnosis not present

## 2014-03-26 DIAGNOSIS — Z992 Dependence on renal dialysis: Secondary | ICD-10-CM | POA: Diagnosis not present

## 2014-03-26 DIAGNOSIS — N186 End stage renal disease: Secondary | ICD-10-CM | POA: Diagnosis not present

## 2014-03-26 MED ORDER — AZITHROMYCIN 250 MG PO TABS: ORAL_TABLET | ORAL | Status: DC

## 2014-03-26 MED ORDER — PREDNISONE 10 MG PO TABS: ORAL_TABLET | ORAL | Status: DC

## 2014-03-26 MED ORDER — BENZONATATE 100 MG PO CAPS: 100.0000 mg | ORAL_CAPSULE | Freq: Two times a day (BID) | ORAL | Status: DC

## 2014-03-26 NOTE — Telephone Encounter (Signed)
Spoke with patient & she states that she did go to the ER & they told her to keep her appt with Dr. Gilford Rile today @ 2:30. She has a lot of congestion & cough. Rx prescribed tesslon perles in the ER for cough. No Abx. CXR-normal.

## 2014-03-26 NOTE — Assessment & Plan Note (Signed)
BP Readings from Last 3 Encounters:  03/26/14 148/72  03/13/14 138/70  12/25/13 150/80   BP generally has been well controlled. Continue current medications.

## 2014-03-26 NOTE — Assessment & Plan Note (Signed)
Continue PD. Labs tomorrow with nephrology.

## 2014-03-26 NOTE — Progress Notes (Signed)
Pre visit review using our clinic review tool, if applicable. No additional management support is needed unless otherwise documented below in the visit note. 

## 2014-03-26 NOTE — Assessment & Plan Note (Signed)
Symptoms most consistent with acute bronchitis after viral URI. Reviewed CXR from Saturday which was normal. WBC also normal. Will start Prednisone low dose burst and Azithromycin. Continue Tessalon as needed for cough. Follow up in 3 days and prn.

## 2014-03-26 NOTE — Telephone Encounter (Signed)
ER records requested.

## 2014-03-26 NOTE — Patient Instructions (Addendum)
Start Prednisone and Azithromycin as directed.  Continue Tessalon as needed for cough.  Follow up Thursday or sooner as needed.

## 2014-03-26 NOTE — Progress Notes (Signed)
Subjective:    Patient ID: Sharon [name], female    DOB: Jun 06, 1926, 81 y.o.   MRN: [id number]  HPI  81YO female presents for follow up.  Went to the ED Friday with cough. CXR and labs were completed and were normal. Diagnosed with viral URI. Treated with Benzonatate.  Feels that she is getting worse. Continues to cough. Cough is productive of thick mucous. Mucous is yellow in color. Feels short of breath at times with cough. Notes some diffuse chest pain with cough. No recent fever at home. Nasal congestion noted.  Missed a couple of nights of peritoneal dialysis, but started back on it last night and Saturday and Sunday.  Panic attacks - resolved since starting Sertraline and prn Clonazepam.   Past medical, surgical, family and social history per today's encounter.  Review of Systems  Constitutional: Positive for fatigue. Negative for fever, chills and unexpected weight change.  HENT: Positive for postnasal drip and rhinorrhea. Negative for congestion, ear discharge, ear pain, facial swelling, hearing loss, mouth sores, nosebleeds, sinus pressure, sneezing, sore throat, tinnitus, trouble swallowing and voice change.   Eyes: Negative for pain, discharge, redness and visual disturbance.  Respiratory: Positive for cough and shortness of breath. Negative for chest tightness, wheezing and stridor.   Cardiovascular: Positive for chest pain. Negative for palpitations and leg swelling.  Musculoskeletal: Negative for myalgias, arthralgias, neck pain and neck stiffness.  Skin: Negative for color change and rash.  Neurological: Negative for dizziness, weakness, light-headedness and headaches.  Hematological: Negative for adenopathy.       Objective:    BP 148/72 mmHg  Pulse 83  Temp(Src) 98 F (36.7 C) (Oral)  Ht 5' 4.5" (1.638 m)  Wt 125 lb 6 oz (56.87 kg)  BMI 21.20 kg/m2  SpO2 96% Physical Exam  Constitutional: She is oriented to person, place, and time. She appears  well-developed and well-nourished. No distress.  HENT:  Head: Normocephalic and atraumatic.  Right Ear: External ear normal.  Left Ear: External ear normal.  Nose: Nose normal.  Mouth/Throat: Oropharynx is clear and moist. No oropharyngeal exudate.  Eyes: Conjunctivae are normal. Pupils are equal, round, and reactive to light. Right eye exhibits no discharge. Left eye exhibits no discharge. No scleral icterus.  Neck: Normal range of motion. Neck supple. No tracheal deviation present. No thyromegaly present.  Cardiovascular: Normal rate, regular rhythm, normal heart sounds and intact distal pulses.  Exam reveals no gallop and no friction rub.   No murmur heard. Pulmonary/Chest: Effort normal. No accessory muscle usage. No tachypnea. No respiratory distress. She has no decreased breath sounds. She has no wheezes. She has rhonchi (scattered). She has no rales. She exhibits no tenderness.  Musculoskeletal: Normal range of motion. She exhibits no edema or tenderness.  Lymphadenopathy:    She has no cervical adenopathy.  Neurological: She is alert and oriented to person, place, and time. No cranial nerve deficit. She exhibits normal muscle tone. Coordination normal.  Skin: Skin is warm and dry. No rash noted. She is not diaphoretic. No erythema. No pallor.  Psychiatric: She has a normal mood and affect. Her behavior is normal. Judgment and thought content normal.          Assessment & Plan:   Problem List Items Addressed This Visit      Unprioritized   Acute bronchitis - Primary    Symptoms most consistent with acute bronchitis after viral URI. Reviewed CXR from Saturday which was normal. WBC also normal. Will start  Prednisone low dose burst and Azithromycin. Continue Tessalon as needed for cough. Follow up in 3 days and prn.      Relevant Medications   predniSONE (DELTASONE) tablet   azithromycin (ZITHROMAX) tablet   Anxiety state    Symptoms improved with addition of Sertraline and  prn Clonazepam. Will continue.      Chronic kidney disease, stage 5, kidney failure    Continue PD. Labs tomorrow with nephrology.      Hypertension    BP Readings from Last 3 Encounters:  03/26/14 148/72  03/13/14 138/70  12/25/13 150/80   BP generally has been well controlled. Continue current medications.          Return in about 3 days (around 03/29/2014) for Recheck.

## 2014-03-26 NOTE — Assessment & Plan Note (Signed)
Symptoms improved with addition of Sertraline and prn Clonazepam. Will continue.

## 2014-03-27 DIAGNOSIS — N186 End stage renal disease: Secondary | ICD-10-CM | POA: Diagnosis not present

## 2014-03-27 DIAGNOSIS — Z992 Dependence on renal dialysis: Secondary | ICD-10-CM | POA: Diagnosis not present

## 2014-03-27 DIAGNOSIS — D509 Iron deficiency anemia, unspecified: Secondary | ICD-10-CM | POA: Diagnosis not present

## 2014-03-29 ENCOUNTER — Ambulatory Visit (INDEPENDENT_AMBULATORY_CARE_PROVIDER_SITE_OTHER)
Admission: RE | Admit: 2014-03-29 | Discharge: 2014-03-29 | Disposition: A | Discharge status: Home / Self Care | Payer: Medicare Other | Source: Ambulatory Visit | Attending: Internal Medicine | Admitting: Internal Medicine

## 2014-03-29 ENCOUNTER — Encounter: Payer: Self-pay | Admitting: Internal Medicine

## 2014-03-29 ENCOUNTER — Ambulatory Visit (INDEPENDENT_AMBULATORY_CARE_PROVIDER_SITE_OTHER): Payer: Medicare Other | Admitting: Internal Medicine

## 2014-03-29 VITALS — BP 149/74 | HR 101 | Temp 98.2°F | Ht 64.5 in | Wt 130.0 lb

## 2014-03-29 DIAGNOSIS — N185 Chronic kidney disease, stage 5: Secondary | ICD-10-CM

## 2014-03-29 DIAGNOSIS — R05 Cough: Secondary | ICD-10-CM | POA: Diagnosis not present

## 2014-03-29 DIAGNOSIS — J209 Acute bronchitis, unspecified: Secondary | ICD-10-CM

## 2014-03-29 MED ORDER — BENZONATATE 100 MG PO CAPS: 100.0000 mg | ORAL_CAPSULE | Freq: Two times a day (BID) | ORAL | Status: DC

## 2014-03-29 NOTE — Progress Notes (Signed)
Subjective:    Patient ID: Sharon [name], female    DOB: [birth year], 81 y.o.   MRN: [id number]  HPI  81YO female presents for follow up.  Last seen 2/29 for acute bronchitis. Started on Prednisone and Azithromycin. CXR reviewed and was normal.  Felt better initially on Prednisone, then developed worsening cough on Wednesday. Continues to have cough, with episodes of severe coughing. Occasional shortness of breath with cough. Some diffuse chest pain with cough. No fever, chills. Using Tessalon with some improvement. Feels very fatigued.  Had blood work this week to recheck kidney function. Follow up with nephrology pending for next week.   Wt Readings from Last 3 Encounters:  03/29/14 130 lb (58.968 kg)  03/26/14 125 lb 6 oz (56.87 kg)  03/13/14 130 lb (58.968 kg)     Past medical, surgical, family and social history per today's encounter.  Review of Systems  Constitutional: Positive for fatigue. Negative for fever, chills and unexpected weight change.  HENT: Positive for congestion. Negative for ear discharge, ear pain, facial swelling, hearing loss, mouth sores, nosebleeds, postnasal drip, rhinorrhea, sinus pressure, sneezing, sore throat, tinnitus, trouble swallowing and voice change.   Eyes: Negative for pain, discharge, redness and visual disturbance.  Respiratory: Positive for cough, chest tightness and shortness of breath. Negative for wheezing and stridor.   Cardiovascular: Negative for chest pain, palpitations and leg swelling.  Musculoskeletal: Negative for myalgias, arthralgias, neck pain and neck stiffness.  Skin: Negative for color change and rash.  Neurological: Negative for dizziness, weakness, light-headedness and headaches.  Hematological: Negative for adenopathy.       Objective:    BP 149/74 mmHg  Pulse 101  Temp(Src) 98.2 F (36.8 C) (Oral)  Ht 5' 4.5" (1.638 m)  Wt 130 lb (58.968 kg)  BMI 21.98 kg/m2  SpO2 99% Physical Exam  Constitutional: She  is oriented to person, place, and time. She appears well-developed and well-nourished. No distress.  HENT:  Head: Normocephalic and atraumatic.  Right Ear: External ear normal.  Left Ear: External ear normal.  Nose: Nose normal.  Mouth/Throat: Oropharynx is clear and moist. No oropharyngeal exudate.  Eyes: Conjunctivae are normal. Pupils are equal, round, and reactive to light. Right eye exhibits no discharge. Left eye exhibits no discharge. No scleral icterus.  Neck: Normal range of motion. Neck supple. No tracheal deviation present. No thyromegaly present.  Cardiovascular: Normal rate, regular rhythm, normal heart sounds and intact distal pulses.  Exam reveals no gallop and no friction rub.   No murmur heard. Pulmonary/Chest: Effort normal. No accessory muscle usage. No respiratory distress. She has no decreased breath sounds. She has wheezes (scattered rhonchi and wheezes). She has rhonchi. She has no rales. She exhibits no tenderness.  Musculoskeletal: Normal range of motion. She exhibits no edema or tenderness.  Lymphadenopathy:    She has no cervical adenopathy.  Neurological: She is alert and oriented to person, place, and time. No cranial nerve deficit. She exhibits normal muscle tone. Coordination normal.  Skin: Skin is warm and dry. No rash noted. She is not diaphoretic. No erythema. No pallor.  Psychiatric: She has a normal mood and affect. Her behavior is normal. Judgment and thought content normal.          Assessment & Plan:   Problem List Items Addressed This Visit      Unprioritized   Acute bronchitis - Primary    Slight improvement in symptoms, however exam still remarkable for scattered rhonchi and wheezes. Will continue  Prednisone and finish Azithromycin. Suspect Pertussis. Repeat CXR today. Follow up on Monday and earlier as needed.      Relevant Orders   DG Chest 2 View   Chronic kidney disease, stage 5, kidney failure    Recent labs completed earlier this  week to follow up kidney function. Will request copy of labs. Follow up with nephrology pending for next week.          Return in about 1 week (around 04/05/2014).

## 2014-03-29 NOTE — Assessment & Plan Note (Signed)
Slight improvement in symptoms, however exam still remarkable for scattered rhonchi and wheezes. Will continue Prednisone and finish Azithromycin. Suspect Pertussis. Repeat CXR today. Follow up on Monday and earlier as needed.

## 2014-03-29 NOTE — Assessment & Plan Note (Signed)
Recent labs completed earlier this week to follow up kidney function. Will request copy of labs. Follow up with nephrology pending for next week.

## 2014-03-29 NOTE — Progress Notes (Signed)
Pre visit review using our clinic review tool, if applicable. No additional management support is needed unless otherwise documented below in the visit note. 

## 2014-03-29 NOTE — Patient Instructions (Addendum)
Please go to our [facility] office today to get a chest xray.  Finish Prednisone taper and Azithromycin.  Continue Tessalon as needed for cough.  Call immediately if any worsening cough, shortness of breath or fever.  Follow up next week.

## 2014-04-02 ENCOUNTER — Ambulatory Visit (INDEPENDENT_AMBULATORY_CARE_PROVIDER_SITE_OTHER): Payer: Medicare Other | Admitting: Internal Medicine

## 2014-04-02 ENCOUNTER — Encounter: Payer: Self-pay | Admitting: Internal Medicine

## 2014-04-02 VITALS — BP 129/75 | HR 98 | Temp 97.9°F | Ht 64.5 in | Wt 127.0 lb

## 2014-04-02 DIAGNOSIS — J209 Acute bronchitis, unspecified: Secondary | ICD-10-CM

## 2014-04-02 NOTE — Progress Notes (Signed)
Pre visit review using our clinic review tool, if applicable. No additional management support is needed unless otherwise documented below in the visit note. 

## 2014-04-02 NOTE — Patient Instructions (Signed)
Continue Tessalon as needed for cough.  Follow up in 2 weeks and sooner as needed.

## 2014-04-02 NOTE — Assessment & Plan Note (Signed)
Symptoms gradually improving. Encouraged her to rest, use Tessalon as needed for cough. Follow up with nephrology next week then here in 2 weeks.

## 2014-04-02 NOTE — Progress Notes (Signed)
   Subjective:    Patient ID: Sharon [name], female    DOB: [birth year], 80 y.o.   MRN: [id number]  HPI 81YO female presents for follow up.  Last seen 3/3 for bronchitis. Repeat CXR on 3/3 was normal.  Continues to have some cough, however improved compared to previous. Taking shallower breaths to try to avoid coughing. Taking the Tessalon to help control cough. No dyspnea. No fever, chills. No chest pain.   Past medical, surgical, family and social history per today's encounter.  Review of Systems  Constitutional: Positive for fatigue. Negative for fever, chills, appetite change and unexpected weight change.  Eyes: Negative for visual disturbance.  Respiratory: Positive for cough. Negative for chest tightness, shortness of breath and wheezing.   Cardiovascular: Negative for chest pain and leg swelling.  Gastrointestinal: Negative for abdominal pain, diarrhea and constipation.  Skin: Negative for color change and rash.  Hematological: Negative for adenopathy. Does not bruise/bleed easily.  Psychiatric/Behavioral: Negative for dysphoric mood. The patient is not nervous/anxious.        Objective:    BP 129/75 mmHg  Pulse 98  Temp(Src) 97.9 F (36.6 C) (Oral)  Ht 5' 4.5" (1.638 m)  Wt 127 lb (57.607 kg)  BMI 21.47 kg/m2  SpO2 97% Physical Exam  Constitutional: She is oriented to person, place, and time. She appears well-developed and well-nourished. No distress.  HENT:  Head: Normocephalic and atraumatic.  Right Ear: External ear normal.  Left Ear: External ear normal.  Nose: Nose normal.  Mouth/Throat: Oropharynx is clear and moist. No oropharyngeal exudate.  Eyes: Conjunctivae are normal. Pupils are equal, round, and reactive to light. Right eye exhibits no discharge. Left eye exhibits no discharge. No scleral icterus.  Neck: Normal range of motion. Neck supple. No tracheal deviation present. No thyromegaly present.  Cardiovascular: Normal rate, regular rhythm, normal  heart sounds and intact distal pulses.  Exam reveals no gallop and no friction rub.   No murmur heard. Pulmonary/Chest: Effort normal and breath sounds normal. No accessory muscle usage. No respiratory distress. She has no decreased breath sounds. She has no wheezes. She has no rhonchi. She has no rales. She exhibits no tenderness.  Musculoskeletal: Normal range of motion. She exhibits no edema or tenderness.  Lymphadenopathy:    She has no cervical adenopathy.  Neurological: She is alert and oriented to person, place, and time. No cranial nerve deficit. She exhibits normal muscle tone. Coordination normal.  Skin: Skin is warm and dry. No rash noted. She is not diaphoretic. No erythema. No pallor.  Psychiatric: She has a normal mood and affect. Her behavior is normal. Judgment and thought content normal.          Assessment & Plan:   Problem List Items Addressed This Visit      Unprioritized   Acute bronchitis - Primary    Symptoms gradually improving. Encouraged her to rest, use Tessalon as needed for cough. Follow up with nephrology next week then here in 2 weeks.          Return in about 2 weeks (around 04/16/2014) for Recheck.

## 2014-04-17 ENCOUNTER — Encounter: Payer: Self-pay | Admitting: Internal Medicine

## 2014-04-17 ENCOUNTER — Ambulatory Visit (INDEPENDENT_AMBULATORY_CARE_PROVIDER_SITE_OTHER): Payer: Medicare Other | Admitting: Internal Medicine

## 2014-04-17 VITALS — BP 146/76 | HR 86 | Temp 97.6°F | Ht 64.5 in | Wt 129.4 lb

## 2014-04-17 DIAGNOSIS — N185 Chronic kidney disease, stage 5: Secondary | ICD-10-CM | POA: Diagnosis not present

## 2014-04-17 DIAGNOSIS — J209 Acute bronchitis, unspecified: Secondary | ICD-10-CM | POA: Diagnosis not present

## 2014-04-17 DIAGNOSIS — F411 Generalized anxiety disorder: Secondary | ICD-10-CM | POA: Diagnosis not present

## 2014-04-17 NOTE — Assessment & Plan Note (Signed)
Some recent bloody drainage from PD catheter. No fever, chills, abdominal pain. Likely irritation from catheter. Will have her follow up with her nephrologist.

## 2014-04-17 NOTE — Progress Notes (Signed)
Subjective:    Patient ID: Sharon [name], female    DOB: 1926/12/31, 81 y.o.   MRN: [id number]  HPI 81YO female presents for follow up.  Recently seen for acute bronchitis.  Cough has nearly resolved. No longer taking cough medication. Some mild congestion. Energy level improved. Stopped taking Clonazepam because of fatigue.  No recent panic attacks. But, has Clonazepam in home in case of symptoms. Taking Sertraline daily. This seems to help more than Wellbutrin.  ESRD on PD -  Some blood noted in drainage last 2 nights. Seems like small amount. No clotting. Plans to follow up with nephrologist.  Past medical, surgical, family and social history per today's encounter.  Review of Systems  Constitutional: Negative for fever, chills, appetite change, fatigue and unexpected weight change.  HENT: Positive for congestion.   Eyes: Negative for visual disturbance.  Respiratory: Positive for cough. Negative for shortness of breath.   Cardiovascular: Negative for chest pain and leg swelling.  Gastrointestinal: Negative for abdominal pain.  Skin: Negative for color change and rash.  Hematological: Negative for adenopathy. Does not bruise/bleed easily.  Psychiatric/Behavioral: Negative for dysphoric mood. The patient is not nervous/anxious.        Objective:    BP 146/76 mmHg  Pulse 86  Temp(Src) 97.6 F (36.4 C) (Oral)  Ht 5' 4.5" (1.638 m)  Wt 129 lb 6 oz (58.684 kg)  BMI 21.87 kg/m2  SpO2 100% Physical Exam  Constitutional: She is oriented to person, place, and time. She appears well-developed and well-nourished. No distress.  HENT:  Head: Normocephalic and atraumatic.  Right Ear: External ear normal.  Left Ear: External ear normal.  Nose: Nose normal.  Mouth/Throat: Oropharynx is clear and moist. No oropharyngeal exudate.  Eyes: Conjunctivae are normal. Pupils are equal, round, and reactive to light. Right eye exhibits no discharge. Left eye exhibits no discharge. No  scleral icterus.  Neck: Normal range of motion. Neck supple. No tracheal deviation present. No thyromegaly present.  Cardiovascular: Normal rate, regular rhythm, normal heart sounds and intact distal pulses.  Exam reveals no gallop and no friction rub.   No murmur heard. Pulmonary/Chest: Effort normal and breath sounds normal. No respiratory distress. She has no wheezes. She has no rales. She exhibits no tenderness.  Musculoskeletal: Normal range of motion. She exhibits no edema or tenderness.  Lymphadenopathy:    She has no cervical adenopathy.  Neurological: She is alert and oriented to person, place, and time. No cranial nerve deficit. She exhibits normal muscle tone. Coordination normal.  Skin: Skin is warm and dry. No rash noted. She is not diaphoretic. No erythema. No pallor.  Psychiatric: She has a normal mood and affect. Her behavior is normal. Judgment and thought content normal.          Assessment & Plan:   Problem List Items Addressed This Visit      Unprioritized   Acute bronchitis - Primary    Symptoms have improved. Will continue to monitor for any recurrent symptoms.      Anxiety state    Anxiety improved with use of Sertraline. Will continue. Add Clonazepam prn for acute panic episodes.      Chronic kidney disease, stage 5, kidney failure    Some recent bloody drainage from PD catheter. No fever, chills, abdominal pain. Likely irritation from catheter. Will have her follow up with her nephrologist.          Return in about 4 weeks (around 05/15/2014) for Wellness Visit.

## 2014-04-17 NOTE — Assessment & Plan Note (Signed)
Symptoms have improved. Will continue to monitor for any recurrent symptoms.

## 2014-04-17 NOTE — Assessment & Plan Note (Signed)
Anxiety improved with use of Sertraline. Will continue. Add Clonazepam prn for acute panic episodes.

## 2014-04-17 NOTE — Progress Notes (Signed)
Pre visit review using our clinic review tool, if applicable. No additional management support is needed unless otherwise documented below in the visit note. 

## 2014-04-17 NOTE — Patient Instructions (Addendum)
Continue current medications.  Follow up for Wellness Visit in 1 month.

## 2014-04-18 ENCOUNTER — Other Ambulatory Visit: Payer: Self-pay | Admitting: Internal Medicine

## 2014-04-27 DIAGNOSIS — D509 Iron deficiency anemia, unspecified: Secondary | ICD-10-CM | POA: Diagnosis not present

## 2014-04-27 DIAGNOSIS — E559 Vitamin D deficiency, unspecified: Secondary | ICD-10-CM | POA: Diagnosis not present

## 2014-04-27 DIAGNOSIS — N186 End stage renal disease: Secondary | ICD-10-CM | POA: Diagnosis not present

## 2014-04-27 DIAGNOSIS — N2581 Secondary hyperparathyroidism of renal origin: Secondary | ICD-10-CM | POA: Diagnosis not present

## 2014-04-27 DIAGNOSIS — Z992 Dependence on renal dialysis: Secondary | ICD-10-CM | POA: Diagnosis not present

## 2014-05-10 DIAGNOSIS — I495 Sick sinus syndrome: Secondary | ICD-10-CM | POA: Diagnosis not present

## 2014-05-14 DIAGNOSIS — I5022 Chronic systolic (congestive) heart failure: Secondary | ICD-10-CM | POA: Diagnosis not present

## 2014-05-14 DIAGNOSIS — I10 Essential (primary) hypertension: Secondary | ICD-10-CM | POA: Diagnosis not present

## 2014-05-14 DIAGNOSIS — E782 Mixed hyperlipidemia: Secondary | ICD-10-CM | POA: Diagnosis not present

## 2014-05-14 DIAGNOSIS — I2510 Atherosclerotic heart disease of native coronary artery without angina pectoris: Secondary | ICD-10-CM | POA: Diagnosis not present

## 2014-05-18 ENCOUNTER — Ambulatory Visit: Payer: Medicare Other | Admitting: Neurology

## 2014-05-19 NOTE — Op Note (Signed)
PATIENT NAME:  Sharon [name], Sharon [name] MR#:  [id number] DATE OF BIRTH:  1926/06/18  DATE OF PROCEDURE:  04/27/2013  PRIMARY CARE PHYSICIAN: Anderson Malta A. Gilford Rile, MD.  PREPROCEDURE DIAGNOSES:  1. Sick sinus syndrome.  2. Elective replacement indication.   PROCEDURE: Dual-chamber pacemaker generator change out.   POSTPROCEDURE DIAGNOSIS: Atrial sensing with ventricular pacing.   INDICATION: The patient is an 81 year old female status post dual-chamber pacemaker for sick sinus syndrome and bradycardia. The patient recently had a pacemaker interrogation which showed that her pacemaker was at elective replacement indication. The patient has baseline bradycardia. The procedure, risks, benefits, and alternatives of dual chamber pacemaker generator change out were explained to the patient and informed written consent was obtained.   DESCRIPTION OF PROCEDURE: She was brought to the operating room in a fasting state. The left pectoral region was prepped and draped in the usual sterile manner. Anesthesia was obtained with 1% Xylocaine locally. A 6 cm incision was performed over the left pectoral region. The pacemaker generator was retrieved by electrocautery and blunt dissection. The leads were disconnected from the old pacemaker generator and connected to a new rate-responsive dual-chamber pacemaker generator (Medtronic [id number]). The pacemaker pocket was irrigated with gentamicin solution. The pacemaker pocket was closed with 2-0 and 4-0 Vicryl, respectively. Steri-Strips and pressure dressing were applied.    ____________________________ Isaias Cowman, MD ap:dd D: 04/27/2013 14:15:23 ET T: 04/27/2013 18:20:29 ET JOB#: [id number]  cc: Isaias Cowman, MD, <Dictator> Isaias Cowman MD ELECTRONICALLY SIGNED 04/29/2013 13:17

## 2014-05-22 ENCOUNTER — Encounter: Payer: Self-pay | Admitting: Internal Medicine

## 2014-05-22 ENCOUNTER — Ambulatory Visit (INDEPENDENT_AMBULATORY_CARE_PROVIDER_SITE_OTHER): Payer: Medicare Other | Admitting: Internal Medicine

## 2014-05-22 VITALS — BP 122/60 | HR 62 | Temp 98.3°F | Resp 14 | Ht 64.5 in | Wt 125.6 lb

## 2014-05-22 DIAGNOSIS — F411 Generalized anxiety disorder: Secondary | ICD-10-CM | POA: Diagnosis not present

## 2014-05-22 DIAGNOSIS — N185 Chronic kidney disease, stage 5: Secondary | ICD-10-CM | POA: Diagnosis not present

## 2014-05-22 DIAGNOSIS — I10 Essential (primary) hypertension: Secondary | ICD-10-CM

## 2014-05-22 DIAGNOSIS — G894 Chronic pain syndrome: Secondary | ICD-10-CM | POA: Insufficient documentation

## 2014-05-22 MED ORDER — SERTRALINE HCL 50 MG PO TABS: ORAL_TABLET | ORAL | Status: DC

## 2014-05-22 MED ORDER — SERTRALINE HCL 100 MG PO TABS
100.0000 mg | ORAL_TABLET | Freq: Every day | ORAL | Status: DC
Start: 2014-05-22 — End: 2014-10-04

## 2014-05-22 NOTE — Assessment & Plan Note (Signed)
Worsening symptoms of anxiety and depressed mood. Will try increasing Sertraline to 100mg  daily. Follow up in 4 weeks and prn.

## 2014-05-22 NOTE — Assessment & Plan Note (Signed)
BP Readings from Last 3 Encounters:  05/22/14 122/60  04/17/14 146/76  04/02/14 129/75   BP well controlled. Continue current medications.

## 2014-05-22 NOTE — Patient Instructions (Addendum)
Increase Sertraline to 100mg  daily.  Continue Tramadol as needed for severe pain.  Follow up in 4 weeks.

## 2014-05-22 NOTE — Progress Notes (Signed)
Subjective:    Patient ID: Sharon [name], female    DOB: 03/12/1926, 81 y.o.   MRN: [id number]  HPI  81YO female presents for follow up.  Arthralgia and myalgia - Diffuse joint pain. Taking Tramadol with some improvement. Feels like even skin hurts at times. Having trouble clamping PD catheter.  Continues to have watery diarrhea after eating. Plans to follow up with Dr. Allen Norris for this. Imaging was reportedly normal.  Depression - Questions if depression and poor sleep are contributing to worsening pain. Taking Sertraline 50mg  daily with minimal improvement.  Review of Systems  Constitutional: Negative for fever, chills, appetite change, fatigue and unexpected weight change.  Eyes: Negative for visual disturbance.  Respiratory: Negative for shortness of breath.   Cardiovascular: Negative for chest pain and leg swelling.  Gastrointestinal: Positive for diarrhea. Negative for nausea, vomiting, abdominal pain and constipation.  Musculoskeletal: Positive for myalgias and arthralgias.  Skin: Negative for color change and rash.  Hematological: Negative for adenopathy. Does not bruise/bleed easily.  Psychiatric/Behavioral: Negative for sleep disturbance and dysphoric mood. The patient is not nervous/anxious.        Objective:    BP 122/60 mmHg  Pulse 62  Temp(Src) 98.3 F (36.8 C) (Oral)  Resp 14  Ht 5' 4.5" (1.638 m)  Wt 125 lb 9.6 oz (56.972 kg)  BMI 21.23 kg/m2  SpO2 98% Physical Exam  Constitutional: She is oriented to person, place, and time. She appears well-developed and well-nourished. No distress.  HENT:  Head: Normocephalic and atraumatic.  Right Ear: External ear normal.  Left Ear: External ear normal.  Nose: Nose normal.  Mouth/Throat: Oropharynx is clear and moist. No oropharyngeal exudate.  Eyes: Conjunctivae are normal. Pupils are equal, round, and reactive to light. Right eye exhibits no discharge. Left eye exhibits no discharge. No scleral icterus.  Neck:  Normal range of motion. Neck supple. No tracheal deviation present. No thyromegaly present.  Cardiovascular: Normal rate, regular rhythm, normal heart sounds and intact distal pulses.  Exam reveals no gallop and no friction rub.   No murmur heard. Pulmonary/Chest: Effort normal and breath sounds normal. No respiratory distress. She has no wheezes. She has no rales. She exhibits no tenderness.  Musculoskeletal: Normal range of motion. She exhibits no edema or tenderness.  Lymphadenopathy:    She has no cervical adenopathy.  Neurological: She is alert and oriented to person, place, and time. No cranial nerve deficit. She exhibits normal muscle tone. Coordination normal.  Skin: Skin is warm and dry. No rash noted. She is not diaphoretic. No erythema. No pallor.  Psychiatric: Her speech is normal and behavior is normal. Judgment and thought content normal. Her mood appears anxious. She expresses no suicidal ideation.          Assessment & Plan:   Problem List Items Addressed This Visit      Unprioritized   Anxiety state    Worsening symptoms of anxiety and depressed mood. Will try increasing Sertraline to 100mg  daily. Follow up in 4 weeks and prn.      Relevant Medications   sertraline (ZOLOFT) 100 MG tablet   Chronic kidney disease, stage 5, kidney failure    Will request recent labs from [facility]. Continue PD      Chronic pain syndrome - Primary    Chronic arthralgia and myalgia. Discussed that some of this is due to OA. Discussed limitations of treatment as she cannot use NSAIDS because of ESRD (she had wanted to try Meloxicam). Will  continue Tramadol prn. Will increase Sertraline to see if better control of depression might help with pain control. We also discussed Cymbalta, however this too is contraindicated in ESRD. If pain persists, then may need to consider Nucynta.      Hypertension    BP Readings from Last 3 Encounters:  05/22/14 122/60  04/17/14 146/76  04/02/14  129/75   BP well controlled. Continue current medications.          Return in about 4 weeks (around 06/19/2014) for [place], Wellness Visit.

## 2014-05-22 NOTE — Assessment & Plan Note (Signed)
Chronic arthralgia and myalgia. Discussed that some of this is due to OA. Discussed limitations of treatment as she cannot use NSAIDS because of ESRD (she had wanted to try Meloxicam). Will continue Tramadol prn. Will increase Sertraline to see if better control of depression might help with pain control. We also discussed Cymbalta, however this too is contraindicated in ESRD. If pain persists, then may need to consider Nucynta.

## 2014-05-22 NOTE — Progress Notes (Signed)
Pre visit review using our clinic review tool, if applicable. No additional management support is needed unless otherwise documented below in the visit note. 

## 2014-05-22 NOTE — Assessment & Plan Note (Signed)
Will request recent labs from [facility]. Continue PD

## 2014-05-23 ENCOUNTER — Other Ambulatory Visit: Payer: Self-pay | Admitting: Internal Medicine

## 2014-05-27 DIAGNOSIS — Z992 Dependence on renal dialysis: Secondary | ICD-10-CM | POA: Diagnosis not present

## 2014-05-27 DIAGNOSIS — N186 End stage renal disease: Secondary | ICD-10-CM | POA: Diagnosis not present

## 2014-05-28 DIAGNOSIS — N186 End stage renal disease: Secondary | ICD-10-CM | POA: Diagnosis not present

## 2014-05-28 DIAGNOSIS — Z992 Dependence on renal dialysis: Secondary | ICD-10-CM | POA: Diagnosis not present

## 2014-05-28 DIAGNOSIS — D509 Iron deficiency anemia, unspecified: Secondary | ICD-10-CM | POA: Diagnosis not present

## 2014-05-29 DIAGNOSIS — D509 Iron deficiency anemia, unspecified: Secondary | ICD-10-CM | POA: Diagnosis not present

## 2014-05-29 DIAGNOSIS — Z992 Dependence on renal dialysis: Secondary | ICD-10-CM | POA: Diagnosis not present

## 2014-05-29 DIAGNOSIS — N186 End stage renal disease: Secondary | ICD-10-CM | POA: Diagnosis not present

## 2014-05-31 DIAGNOSIS — Z992 Dependence on renal dialysis: Secondary | ICD-10-CM | POA: Diagnosis not present

## 2014-05-31 DIAGNOSIS — N186 End stage renal disease: Secondary | ICD-10-CM | POA: Diagnosis not present

## 2014-05-31 DIAGNOSIS — D509 Iron deficiency anemia, unspecified: Secondary | ICD-10-CM | POA: Diagnosis not present

## 2014-06-01 DIAGNOSIS — Z992 Dependence on renal dialysis: Secondary | ICD-10-CM | POA: Diagnosis not present

## 2014-06-01 DIAGNOSIS — D509 Iron deficiency anemia, unspecified: Secondary | ICD-10-CM | POA: Diagnosis not present

## 2014-06-01 DIAGNOSIS — N186 End stage renal disease: Secondary | ICD-10-CM | POA: Diagnosis not present

## 2014-06-02 DIAGNOSIS — D509 Iron deficiency anemia, unspecified: Secondary | ICD-10-CM | POA: Diagnosis not present

## 2014-06-02 DIAGNOSIS — Z992 Dependence on renal dialysis: Secondary | ICD-10-CM | POA: Diagnosis not present

## 2014-06-02 DIAGNOSIS — N186 End stage renal disease: Secondary | ICD-10-CM | POA: Diagnosis not present

## 2014-06-04 DIAGNOSIS — D509 Iron deficiency anemia, unspecified: Secondary | ICD-10-CM | POA: Diagnosis not present

## 2014-06-04 DIAGNOSIS — N186 End stage renal disease: Secondary | ICD-10-CM | POA: Diagnosis not present

## 2014-06-04 DIAGNOSIS — Z992 Dependence on renal dialysis: Secondary | ICD-10-CM | POA: Diagnosis not present

## 2014-06-05 DIAGNOSIS — D509 Iron deficiency anemia, unspecified: Secondary | ICD-10-CM | POA: Diagnosis not present

## 2014-06-05 DIAGNOSIS — N186 End stage renal disease: Secondary | ICD-10-CM | POA: Diagnosis not present

## 2014-06-05 DIAGNOSIS — Z992 Dependence on renal dialysis: Secondary | ICD-10-CM | POA: Diagnosis not present

## 2014-06-07 DIAGNOSIS — D509 Iron deficiency anemia, unspecified: Secondary | ICD-10-CM | POA: Diagnosis not present

## 2014-06-07 DIAGNOSIS — N186 End stage renal disease: Secondary | ICD-10-CM | POA: Diagnosis not present

## 2014-06-07 DIAGNOSIS — Z992 Dependence on renal dialysis: Secondary | ICD-10-CM | POA: Diagnosis not present

## 2014-06-08 DIAGNOSIS — D509 Iron deficiency anemia, unspecified: Secondary | ICD-10-CM | POA: Diagnosis not present

## 2014-06-08 DIAGNOSIS — N186 End stage renal disease: Secondary | ICD-10-CM | POA: Diagnosis not present

## 2014-06-08 DIAGNOSIS — Z992 Dependence on renal dialysis: Secondary | ICD-10-CM | POA: Diagnosis not present

## 2014-06-09 DIAGNOSIS — Z992 Dependence on renal dialysis: Secondary | ICD-10-CM | POA: Diagnosis not present

## 2014-06-09 DIAGNOSIS — N186 End stage renal disease: Secondary | ICD-10-CM | POA: Diagnosis not present

## 2014-06-09 DIAGNOSIS — D509 Iron deficiency anemia, unspecified: Secondary | ICD-10-CM | POA: Diagnosis not present

## 2014-06-11 DIAGNOSIS — D509 Iron deficiency anemia, unspecified: Secondary | ICD-10-CM | POA: Diagnosis not present

## 2014-06-11 DIAGNOSIS — Z992 Dependence on renal dialysis: Secondary | ICD-10-CM | POA: Diagnosis not present

## 2014-06-11 DIAGNOSIS — N186 End stage renal disease: Secondary | ICD-10-CM | POA: Diagnosis not present

## 2014-06-12 DIAGNOSIS — D509 Iron deficiency anemia, unspecified: Secondary | ICD-10-CM | POA: Diagnosis not present

## 2014-06-12 DIAGNOSIS — N186 End stage renal disease: Secondary | ICD-10-CM | POA: Diagnosis not present

## 2014-06-12 DIAGNOSIS — Z992 Dependence on renal dialysis: Secondary | ICD-10-CM | POA: Diagnosis not present

## 2014-06-14 DIAGNOSIS — Z992 Dependence on renal dialysis: Secondary | ICD-10-CM | POA: Diagnosis not present

## 2014-06-14 DIAGNOSIS — N186 End stage renal disease: Secondary | ICD-10-CM | POA: Diagnosis not present

## 2014-06-14 DIAGNOSIS — D509 Iron deficiency anemia, unspecified: Secondary | ICD-10-CM | POA: Diagnosis not present

## 2014-06-15 DIAGNOSIS — Z992 Dependence on renal dialysis: Secondary | ICD-10-CM | POA: Diagnosis not present

## 2014-06-15 DIAGNOSIS — N186 End stage renal disease: Secondary | ICD-10-CM | POA: Diagnosis not present

## 2014-06-15 DIAGNOSIS — D509 Iron deficiency anemia, unspecified: Secondary | ICD-10-CM | POA: Diagnosis not present

## 2014-06-16 DIAGNOSIS — D509 Iron deficiency anemia, unspecified: Secondary | ICD-10-CM | POA: Diagnosis not present

## 2014-06-16 DIAGNOSIS — Z992 Dependence on renal dialysis: Secondary | ICD-10-CM | POA: Diagnosis not present

## 2014-06-16 DIAGNOSIS — N186 End stage renal disease: Secondary | ICD-10-CM | POA: Diagnosis not present

## 2014-06-18 DIAGNOSIS — N186 End stage renal disease: Secondary | ICD-10-CM | POA: Diagnosis not present

## 2014-06-18 DIAGNOSIS — Z992 Dependence on renal dialysis: Secondary | ICD-10-CM | POA: Diagnosis not present

## 2014-06-18 DIAGNOSIS — D509 Iron deficiency anemia, unspecified: Secondary | ICD-10-CM | POA: Diagnosis not present

## 2014-06-19 DIAGNOSIS — D509 Iron deficiency anemia, unspecified: Secondary | ICD-10-CM | POA: Diagnosis not present

## 2014-06-19 DIAGNOSIS — N186 End stage renal disease: Secondary | ICD-10-CM | POA: Diagnosis not present

## 2014-06-19 DIAGNOSIS — Z992 Dependence on renal dialysis: Secondary | ICD-10-CM | POA: Diagnosis not present

## 2014-06-21 DIAGNOSIS — D509 Iron deficiency anemia, unspecified: Secondary | ICD-10-CM | POA: Diagnosis not present

## 2014-06-21 DIAGNOSIS — Z992 Dependence on renal dialysis: Secondary | ICD-10-CM | POA: Diagnosis not present

## 2014-06-21 DIAGNOSIS — N186 End stage renal disease: Secondary | ICD-10-CM | POA: Diagnosis not present

## 2014-06-22 ENCOUNTER — Ambulatory Visit (INDEPENDENT_AMBULATORY_CARE_PROVIDER_SITE_OTHER): Payer: Medicare Other

## 2014-06-22 VITALS — BP 124/62 | HR 74 | Temp 98.2°F | Resp 14 | Ht 64.5 in | Wt 122.2 lb

## 2014-06-22 DIAGNOSIS — Z0000 Encounter for general adult medical examination without abnormal findings: Secondary | ICD-10-CM | POA: Diagnosis not present

## 2014-06-22 DIAGNOSIS — N186 End stage renal disease: Secondary | ICD-10-CM | POA: Diagnosis not present

## 2014-06-22 DIAGNOSIS — Z992 Dependence on renal dialysis: Secondary | ICD-10-CM | POA: Diagnosis not present

## 2014-06-22 DIAGNOSIS — Z780 Asymptomatic menopausal state: Secondary | ICD-10-CM

## 2014-06-22 DIAGNOSIS — D509 Iron deficiency anemia, unspecified: Secondary | ICD-10-CM | POA: Diagnosis not present

## 2014-06-22 NOTE — Patient Instructions (Addendum)
[name] , Thank you for taking time to come for your Medicare Wellness Visit. I appreciate your ongoing commitment to your health goals. Please review the following plan we discussed and let me know if I can assist you in the future.   These are the goals we discussed: Goals    . Gain weight    . Increase lean proteins       This is a list of the screening recommended for you and due dates:  Health Maintenance  Topic Date Due  . Tetanus Vaccine  09/20/1947  . DEXA scan (bone density measurement)  09/19/1993  . Flu Shot  08/27/2014  . Colon Cancer Screening  08/02/2019  . Shingles Vaccine  Completed    Health Maintenance Adopting a healthy lifestyle and getting preventive care can go a long way to promote health and wellness. Talk with your health care provider about what schedule of regular examinations is right for you. This is a good chance for you to check in with your provider about disease prevention and staying healthy. In between checkups, there are plenty of things you can do on your own. Experts have done a lot of research about which lifestyle changes and preventive measures are most likely to keep you healthy. Ask your health care provider for more information. WEIGHT AND DIET  Eat a healthy diet  Be sure to include plenty of vegetables, fruits, low-fat dairy products, and lean protein.  Do not eat a lot of foods high in solid fats, added sugars, or salt.  Get regular exercise. This is one of the most important things you can do for your health.  Most adults should exercise for at least 150 minutes each week. The exercise should increase your heart rate and make you sweat (moderate-intensity exercise).  Most adults should also do strengthening exercises at least twice a week. This is in addition to the moderate-intensity exercise.  Maintain a healthy weight  Body mass index (BMI) is a measurement that can be used to identify possible weight problems. It estimates  body fat based on height and weight. Your health care provider can help determine your BMI and help you achieve or maintain a healthy weight.  For females 5 years of age and older:   A BMI below 18.5 is considered underweight.  A BMI of 18.5 to 24.9 is normal.  A BMI of 25 to 29.9 is considered overweight.  A BMI of 30 and above is considered obese.  Watch levels of cholesterol and blood lipids  You should start having your blood tested for lipids and cholesterol at 79 years of age, then have this test every 5 years.  You may need to have your cholesterol levels checked more often if:  Your lipid or cholesterol levels are high.  You are older than 79 years of age.  You are at high risk for heart disease.  CANCER SCREENING   Lung Cancer  Lung cancer screening is recommended for adults 51-48 years old who are at high risk for lung cancer because of a history of smoking.  A yearly low-dose CT scan of the lungs is recommended for people who:  Currently smoke.  Have quit within the past 15 years.  Have at least a 30-pack-year history of smoking. A pack year is smoking an average of one pack of cigarettes a day for 1 year.  Yearly screening should continue until it has been 15 years since you quit.  Yearly screening should stop if  you develop a health problem that would prevent you from having lung cancer treatment.  Breast Cancer  Practice breast self-awareness. This means understanding how your breasts normally appear and feel.  It also means doing regular breast self-exams. Let your health care provider know about any changes, no matter how small.  If you are in your 20s or 30s, you should have a clinical breast exam (CBE) by a health care provider every 1-3 years as part of a regular health exam.  If you are 61 or older, have a CBE every year. Also consider having a breast X-ray (mammogram) every year.  If you have a family history of breast cancer, talk to your  health care provider about genetic screening.  If you are at high risk for breast cancer, talk to your health care provider about having an MRI and a mammogram every year.  Breast cancer gene (BRCA) assessment is recommended for women who have family members with BRCA-related cancers. BRCA-related cancers include:  Breast.  Ovarian.  Tubal.  Peritoneal cancers.  Results of the assessment will determine the need for genetic counseling and BRCA1 and BRCA2 testing. Cervical Cancer Routine pelvic examinations to screen for cervical cancer are no longer recommended for nonpregnant women who are considered low risk for cancer of the pelvic organs (ovaries, uterus, and vagina) and who do not have symptoms. A pelvic examination may be necessary if you have symptoms including those associated with pelvic infections. Ask your health care provider if a screening pelvic exam is right for you.   The Pap test is the screening test for cervical cancer for women who are considered at risk.  If you had a hysterectomy for a problem that was not cancer or a condition that could lead to cancer, then you no longer need Pap tests.  If you are older than 65 years, and you have had normal Pap tests for the past 10 years, you no longer need to have Pap tests.  If you have had past treatment for cervical cancer or a condition that could lead to cancer, you need Pap tests and screening for cancer for at least 20 years after your treatment.  If you no longer get a Pap test, assess your risk factors if they change (such as having a new sexual partner). This can affect whether you should start being screened again.  Some women have medical problems that increase their chance of getting cervical cancer. If this is the case for you, your health care provider may recommend more frequent screening and Pap tests.  The human papillomavirus (HPV) test is another test that may be used for cervical cancer screening. The HPV  test looks for the virus that can cause cell changes in the cervix. The cells collected during the Pap test can be tested for HPV.  The HPV test can be used to screen women 41 years of age and older. Getting tested for HPV can extend the interval between normal Pap tests from three to five years.  An HPV test also should be used to screen women of any age who have unclear Pap test results.  After 79 years of age, women should have HPV testing as often as Pap tests.  Colorectal Cancer  This type of cancer can be detected and often prevented.  Routine colorectal cancer screening usually begins at 79 years of age and continues through 79 years of age.  Your health care provider may recommend screening at an earlier age if you  have risk factors for colon cancer.  Your health care provider may also recommend using home test kits to check for hidden blood in the stool.  A small camera at the end of a tube can be used to examine your colon directly (sigmoidoscopy or colonoscopy). This is done to check for the earliest forms of colorectal cancer.  Routine screening usually begins at age 25.  Direct examination of the colon should be repeated every 5-10 years through 79 years of age. However, you may need to be screened more often if early forms of precancerous polyps or small growths are found. Skin Cancer  Check your skin from head to toe regularly.  Tell your health care provider about any new moles or changes in moles, especially if there is a change in a mole's shape or color.  Also tell your health care provider if you have a mole that is larger than the size of a pencil eraser.  Always use sunscreen. Apply sunscreen liberally and repeatedly throughout the day.  Protect yourself by wearing long sleeves, pants, a wide-brimmed hat, and sunglasses whenever you are outside. HEART DISEASE, DIABETES, AND HIGH BLOOD PRESSURE   Have your blood pressure checked at least every 1-2 years. High  blood pressure causes heart disease and increases the risk of stroke.  If you are between 61 years and 36 years old, ask your health care provider if you should take aspirin to prevent strokes.  Have regular diabetes screenings. This involves taking a blood sample to check your fasting blood sugar level.  If you are at a normal weight and have a low risk for diabetes, have this test once every three years after 79 years of age.  If you are overweight and have a high risk for diabetes, consider being tested at a younger age or more often. PREVENTING INFECTION  Hepatitis B  If you have a higher risk for hepatitis B, you should be screened for this virus. You are considered at high risk for hepatitis B if:  You were born in a country where hepatitis B is common. Ask your health care provider which countries are considered high risk.  Your parents were born in a high-risk country, and you have not been immunized against hepatitis B (hepatitis B vaccine).  You have HIV or AIDS.  You use needles to inject street drugs.  You live with someone who has hepatitis B.  You have had sex with someone who has hepatitis B.  You get hemodialysis treatment.  You take certain medicines for conditions, including cancer, organ transplantation, and autoimmune conditions. Hepatitis C  Blood testing is recommended for:  Everyone born from [birth year] through 1963.  Anyone with known risk factors for hepatitis C. Sexually transmitted infections (STIs)  You should be screened for sexually transmitted infections (STIs) including gonorrhea and chlamydia if:  You are sexually active and are younger than 79 years of age.  You are older than 79 years of age and your health care provider tells you that you are at risk for this type of infection.  Your sexual activity has changed since you were last screened and you are at an increased risk for chlamydia or gonorrhea. Ask your health care provider if you are at  risk.  If you do not have HIV, but are at risk, it may be recommended that you take a prescription medicine daily to prevent HIV infection. This is called pre-exposure prophylaxis (PrEP). You are considered at risk if:  You are  sexually active and do not regularly use condoms or know the HIV status of your partner(s).  You take drugs by injection.  You are sexually active with a partner who has HIV. Talk with your health care provider about whether you are at high risk of being infected with HIV. If you choose to begin PrEP, you should first be tested for HIV. You should then be tested every 3 months for as long as you are taking PrEP.  PREGNANCY   If you are premenopausal and you may become pregnant, ask your health care provider about preconception counseling.  If you may become pregnant, take 400 to 800 micrograms (mcg) of folic acid every day.  If you want to prevent pregnancy, talk to your health care provider about birth control (contraception). OSTEOPOROSIS AND MENOPAUSE   Osteoporosis is a disease in which the bones lose minerals and strength with aging. This can result in serious bone fractures. Your risk for osteoporosis can be identified using a bone density scan.  If you are 58 years of age or older, or if you are at risk for osteoporosis and fractures, ask your health care provider if you should be screened.  Ask your health care provider whether you should take a calcium or vitamin D supplement to lower your risk for osteoporosis.  Menopause may have certain physical symptoms and risks.  Hormone replacement therapy may reduce some of these symptoms and risks. Talk to your health care provider about whether hormone replacement therapy is right for you.  HOME CARE INSTRUCTIONS   Schedule regular health, dental, and eye exams.  Stay current with your immunizations.   Do not use any tobacco products including cigarettes, chewing tobacco, or electronic cigarettes.  If  you are pregnant, do not drink alcohol.  If you are breastfeeding, limit how much and how often you drink alcohol.  Limit alcohol intake to no more than 1 drink per day for nonpregnant women. One drink equals 12 ounces of beer, 5 ounces of wine, or 1 ounces of hard liquor.  Do not use street drugs.  Do not share needles.  Ask your health care provider for help if you need support or information about quitting drugs.  Tell your health care provider if you often feel depressed.  Tell your health care provider if you have ever been abused or do not feel safe at home. Document Released: 07/28/2010 Document Revised: 05/29/2013 Document Reviewed: 12/14/2012 [facility] Patient Information 2015 [place], [place]. This information is not intended to replace advice given to you by your health care provider. Make sure you discuss any questions you have with your health care provider.

## 2014-06-22 NOTE — Progress Notes (Signed)
Subjective:   Sharon [name] is a 81 y.o. female who presents for Medicare Annual (Subsequent) preventive examination.  Review of Systems:   Cardiac Risk Factors include: advanced age (>41men, >54 women);hypertension;smoking/ tobacco exposure     Objective:     Vitals: BP 124/62 mmHg  Pulse 74  Temp(Src) 98.2 F (36.8 C) (Oral)  Resp 14  Ht 5' 4.5" (1.638 m)  Wt 122 lb 4 oz (55.452 kg)  BMI 20.67 kg/m2  SpO2 97%  Tobacco History  Smoking status  . Former Smoker  . Quit date: 03/31/1948  Smokeless tobacco  . Never Used     Counseling given: Not Answered   Past Medical History  Diagnosis Date  . Peritoneal dialysis status   . Meniere disease   . Meniere's disease   . Kidney failure July 2012  . Kidney failure    Past Surgical History  Procedure Laterality Date  . Pacemaker insertion  2006  . Cholecystectomy  01/2008  . Total knee arthroplasty  2008    LEFT/Dr [place]  . Cataract extraction  2006, 2011  . Insertion of dialysis catheter  07/2010  . Abdominal adhesion surgery  1958   Family History  Problem Relation Age of Onset  . Cancer Mother     ? ovarian - sarcoma  . Cancer Father     Skin cancer  . Diabetes Sister   . COPD Sister   . Depression Sister   . Cancer Sister     Lung - 62 yrs old   History  Sexual Activity  . Sexual Activity: Not on file    Outpatient Encounter Prescriptions as of 06/22/2014  Medication Sig  . dicyclomine (BENTYL) 10 MG capsule Take 1 capsule (10 mg total) by mouth 3 (three) times daily as needed for spasms.  . folic acid-vitamin b complex-vitamin c-selenium-zinc (DIALYVITE) 3 MG TABS tablet Take 1 tablet by mouth daily.  Marland Kitchen gentamicin ointment (GARAMYCIN) 0.1 %   . sertraline (ZOLOFT) 100 MG tablet Take 1 tablet (100 mg total) by mouth daily.  . TOPROL XL 25 MG 24 hr tablet TAKE 1/2 BY MOUTH DAILY  . traMADol (ULTRAM) 50 MG tablet 1/2-1 tid prn  . [DISCONTINUED] calcitRIOL (ROCALTROL) 0.25 MCG capsule Take 0.25  mcg by mouth every other day.   No facility-administered encounter medications on file as of 06/22/2014.    Activities of Daily Living In your present state of health, do you have any difficulty performing the following activities: 06/22/2014  Hearing? Y  Vision? Y  Difficulty concentrating or making decisions? Y  Walking or climbing stairs? Y  Dressing or bathing? N  Doing errands, shopping? N  Preparing Food and eating ? N  Using the Toilet? N  In the past six months, have you accidently leaked urine? Y  Do you have problems with loss of bowel control? Y  Managing your Medications? N  Managing your Finances? N  Housekeeping or managing your Housekeeping? Y    Patient Care Team: [name], MD as PCP - General (Internal Medicine) Anthonette Legato, MD (Nephrology) Corey Skains, MD as Consulting Physician (Internal Medicine)    Assessment:     Exercise Activities and Dietary recommendations Current Exercise Habits:: The patient does not participate in regular exercise at present  Goals    . Gain weight    . Increase lean proteins      Fall Risk Fall Risk  06/22/2014 05/22/2014 11/10/2012 02/05/2012  Falls in the past year?  No No No No   Depression Screen PHQ 2/9 Scores 06/22/2014 05/22/2014 11/10/2012 11/10/2012  PHQ - 2 Score 2 1 2  0  PHQ- 9 Score 5 - 10 -     Cognitive Testing No flowsheet data found.  Immunization History  Administered Date(s) Administered  . Influenza Split 09/27/2010, 10/03/2011, 10/27/2012  . Influenza-Unspecified 10/10/2013  . Pneumococcal Conjugate-13 05/12/2013  . Pneumococcal Polysaccharide-23 01/27/2008  . Zoster 05/26/2008   Screening Tests Health Maintenance  Topic Date Due  . TETANUS/TDAP  09/20/1947  . DEXA SCAN  09/19/1993  . INFLUENZA VACCINE  08/27/2014  . COLONOSCOPY  08/02/2019  . ZOSTAVAX  Completed      Plan:    During the course of the visit the patient was educated and counseled about the following  appropriate screening and preventive services:   Vaccines to include Pneumoccal, Influenza, Hepatitis B, Td, Zostavax, HCV  Electrocardiogram  Cardiovascular Disease  Colorectal cancer screening  Bone density screening  Diabetes screening  Glaucoma screening  Mammography/PAP  Nutrition counseling   Patient Instructions (the written plan) was given to the patient.   Geni Bers, LPN  0/12/2239

## 2014-06-23 DIAGNOSIS — Z992 Dependence on renal dialysis: Secondary | ICD-10-CM | POA: Diagnosis not present

## 2014-06-23 DIAGNOSIS — D509 Iron deficiency anemia, unspecified: Secondary | ICD-10-CM | POA: Diagnosis not present

## 2014-06-23 DIAGNOSIS — N186 End stage renal disease: Secondary | ICD-10-CM | POA: Diagnosis not present

## 2014-06-23 NOTE — Progress Notes (Signed)
Annual Wellness Visit as completed by [facility] Coach was reviewed in full.  

## 2014-06-25 DIAGNOSIS — Z992 Dependence on renal dialysis: Secondary | ICD-10-CM | POA: Diagnosis not present

## 2014-06-25 DIAGNOSIS — D509 Iron deficiency anemia, unspecified: Secondary | ICD-10-CM | POA: Diagnosis not present

## 2014-06-25 DIAGNOSIS — N186 End stage renal disease: Secondary | ICD-10-CM | POA: Diagnosis not present

## 2014-06-26 DIAGNOSIS — N186 End stage renal disease: Secondary | ICD-10-CM | POA: Diagnosis not present

## 2014-06-26 DIAGNOSIS — D509 Iron deficiency anemia, unspecified: Secondary | ICD-10-CM | POA: Diagnosis not present

## 2014-06-26 DIAGNOSIS — Z992 Dependence on renal dialysis: Secondary | ICD-10-CM | POA: Diagnosis not present

## 2014-06-27 DIAGNOSIS — D509 Iron deficiency anemia, unspecified: Secondary | ICD-10-CM | POA: Diagnosis not present

## 2014-06-27 DIAGNOSIS — Z992 Dependence on renal dialysis: Secondary | ICD-10-CM | POA: Diagnosis not present

## 2014-06-27 DIAGNOSIS — N186 End stage renal disease: Secondary | ICD-10-CM | POA: Diagnosis not present

## 2014-06-28 DIAGNOSIS — D509 Iron deficiency anemia, unspecified: Secondary | ICD-10-CM | POA: Diagnosis not present

## 2014-06-28 DIAGNOSIS — N186 End stage renal disease: Secondary | ICD-10-CM | POA: Diagnosis not present

## 2014-06-28 DIAGNOSIS — Z992 Dependence on renal dialysis: Secondary | ICD-10-CM | POA: Diagnosis not present

## 2014-06-29 DIAGNOSIS — Z992 Dependence on renal dialysis: Secondary | ICD-10-CM | POA: Diagnosis not present

## 2014-06-29 DIAGNOSIS — D509 Iron deficiency anemia, unspecified: Secondary | ICD-10-CM | POA: Diagnosis not present

## 2014-06-29 DIAGNOSIS — N186 End stage renal disease: Secondary | ICD-10-CM | POA: Diagnosis not present

## 2014-06-30 DIAGNOSIS — N186 End stage renal disease: Secondary | ICD-10-CM | POA: Diagnosis not present

## 2014-06-30 DIAGNOSIS — D509 Iron deficiency anemia, unspecified: Secondary | ICD-10-CM | POA: Diagnosis not present

## 2014-06-30 DIAGNOSIS — Z992 Dependence on renal dialysis: Secondary | ICD-10-CM | POA: Diagnosis not present

## 2014-07-02 DIAGNOSIS — Z992 Dependence on renal dialysis: Secondary | ICD-10-CM | POA: Diagnosis not present

## 2014-07-02 DIAGNOSIS — N186 End stage renal disease: Secondary | ICD-10-CM | POA: Diagnosis not present

## 2014-07-02 DIAGNOSIS — D509 Iron deficiency anemia, unspecified: Secondary | ICD-10-CM | POA: Diagnosis not present

## 2014-07-03 ENCOUNTER — Ambulatory Visit (INDEPENDENT_AMBULATORY_CARE_PROVIDER_SITE_OTHER): Payer: Medicare Other | Admitting: Internal Medicine

## 2014-07-03 ENCOUNTER — Encounter: Payer: Self-pay | Admitting: Internal Medicine

## 2014-07-03 VITALS — BP 116/65 | HR 71 | Temp 97.5°F | Ht 64.5 in | Wt 123.0 lb

## 2014-07-03 DIAGNOSIS — I10 Essential (primary) hypertension: Secondary | ICD-10-CM

## 2014-07-03 DIAGNOSIS — G894 Chronic pain syndrome: Secondary | ICD-10-CM | POA: Diagnosis not present

## 2014-07-03 DIAGNOSIS — Z992 Dependence on renal dialysis: Secondary | ICD-10-CM | POA: Diagnosis not present

## 2014-07-03 DIAGNOSIS — N185 Chronic kidney disease, stage 5: Secondary | ICD-10-CM | POA: Diagnosis not present

## 2014-07-03 DIAGNOSIS — F329 Major depressive disorder, single episode, unspecified: Secondary | ICD-10-CM | POA: Diagnosis not present

## 2014-07-03 DIAGNOSIS — D509 Iron deficiency anemia, unspecified: Secondary | ICD-10-CM | POA: Diagnosis not present

## 2014-07-03 DIAGNOSIS — N186 End stage renal disease: Secondary | ICD-10-CM | POA: Diagnosis not present

## 2014-07-03 DIAGNOSIS — F32A Depression, unspecified: Secondary | ICD-10-CM

## 2014-07-03 LAB — TSH: TSH: 6.93 u[IU]/mL — ABNORMAL HIGH (ref 0.35–4.50)

## 2014-07-03 MED ORDER — TRAMADOL HCL 50 MG PO TABS: 25.0000 mg | ORAL_TABLET | Freq: Three times a day (TID) | ORAL | Status: DC | PRN

## 2014-07-03 NOTE — Assessment & Plan Note (Signed)
Continue PD.

## 2014-07-03 NOTE — Assessment & Plan Note (Signed)
BP Readings from Last 3 Encounters:  07/03/14 116/65  06/22/14 124/62  05/22/14 122/60   BP well controlled. Continue Toprol.

## 2014-07-03 NOTE — Assessment & Plan Note (Signed)
Continue Sertraline. She is not interested in try alternative medications.

## 2014-07-03 NOTE — Patient Instructions (Signed)
Labs today.   Follow up in 3 months.  

## 2014-07-03 NOTE — Progress Notes (Signed)
Subjective:    Patient ID: Sharon [name], female    DOB: 02/15/1926, 81 y.o.   MRN: [id number]  HPI  81YO female presents for follow up.  Feeling exhausted and having pain "all over."  Unsure if depression may be playing a role. Seen by nephrology. Started on Boost Plus, however this is causing diarrhea to be worse. Recent labs including blood counts, thyroid function and B12 normal.  Taking Tramadol with some improvement. Tried PT, however this made pain worse.  Continues to have watery diarrhea, several episodes daily. Non-bloody. Previously had flex sig with Dr. Servando Snare which was normal. Plans to follow up with him.  Past medical, surgical, family and social history per today's encounter.  Review of Systems  Constitutional: Negative for fever, chills, appetite change, fatigue and unexpected weight change.  Eyes: Negative for visual disturbance.  Respiratory: Negative for shortness of breath.   Cardiovascular: Negative for chest pain and leg swelling.  Gastrointestinal: Positive for diarrhea. Negative for nausea, vomiting, abdominal pain and constipation.  Musculoskeletal: Positive for myalgias and arthralgias.  Skin: Negative for color change and rash.  Hematological: Negative for adenopathy. Does not bruise/bleed easily.  Psychiatric/Behavioral: Positive for sleep disturbance and dysphoric mood. The patient is not nervous/anxious.        Objective:    BP 116/65 mmHg  Pulse 71  Temp(Src) 97.5 F (36.4 C) (Oral)  Ht 5' 4.5" (1.638 m)  Wt 123 lb (55.792 kg)  BMI 20.79 kg/m2  SpO2 98% Physical Exam  Constitutional: She is oriented to person, place, and time. She appears well-developed and well-nourished. No distress.  HENT:  Head: Normocephalic and atraumatic.  Right Ear: External ear normal.  Left Ear: External ear normal.  Nose: Nose normal.  Mouth/Throat: Oropharynx is clear and moist. No oropharyngeal exudate.  Eyes: Conjunctivae are normal. Pupils are equal,  round, and reactive to light. Right eye exhibits no discharge. Left eye exhibits no discharge. No scleral icterus.  Neck: Normal range of motion. Neck supple. No tracheal deviation present. No thyromegaly present.  Cardiovascular: Normal rate, regular rhythm, normal heart sounds and intact distal pulses.  Exam reveals no gallop and no friction rub.   No murmur heard. Pulmonary/Chest: Effort normal and breath sounds normal. No respiratory distress. She has no wheezes. She has no rales. She exhibits no tenderness.  Musculoskeletal: Normal range of motion. She exhibits no edema or tenderness.  Lymphadenopathy:    She has no cervical adenopathy.  Neurological: She is alert and oriented to person, place, and time. No cranial nerve deficit. She exhibits normal muscle tone. Coordination normal.  Skin: Skin is warm and dry. No rash noted. She is not diaphoretic. No erythema. No pallor.  Psychiatric: She has a normal mood and affect. Her behavior is normal. Judgment and thought content normal.          Assessment & Plan:   Problem List Items Addressed This Visit      Unprioritized   Chronic kidney disease, stage 5, kidney failure    Continue PD.      Chronic pain syndrome - Primary    Diffuse chronic pain. Will check ESR to evaluate for PMR. Continue prn Tramadol.      Relevant Orders   Sed Rate (ESR)   TSH   Depression    Continue Sertraline. She is not interested in try alternative medications.      Hypertension    BP Readings from Last 3 Encounters:  07/03/14 116/65  06/22/14 124/62  05/22/14 122/60   BP well controlled. Continue Toprol.          No Follow-up on file.

## 2014-07-03 NOTE — Assessment & Plan Note (Signed)
Diffuse chronic pain. Will check ESR to evaluate for PMR. Continue prn Tramadol.

## 2014-07-03 NOTE — Progress Notes (Signed)
Pre visit review using our clinic review tool, if applicable. No additional management support is needed unless otherwise documented below in the visit note. 

## 2014-07-04 ENCOUNTER — Other Ambulatory Visit: Payer: Self-pay | Admitting: *Deleted

## 2014-07-04 ENCOUNTER — Other Ambulatory Visit (INDEPENDENT_AMBULATORY_CARE_PROVIDER_SITE_OTHER): Payer: Medicare Other

## 2014-07-04 ENCOUNTER — Telehealth: Payer: Self-pay | Admitting: *Deleted

## 2014-07-04 DIAGNOSIS — E039 Hypothyroidism, unspecified: Secondary | ICD-10-CM

## 2014-07-04 DIAGNOSIS — I519 Heart disease, unspecified: Principal | ICD-10-CM

## 2014-07-04 LAB — SEDIMENTATION RATE: Sed Rate: 50 mm/hr — ABNORMAL HIGH (ref 0–22)

## 2014-07-04 LAB — T4, FREE: FREE T4: 0.84 ng/dL (ref 0.60–1.60)

## 2014-07-04 NOTE — Telephone Encounter (Signed)
Hypothyroidism

## 2014-07-04 NOTE — Telephone Encounter (Signed)
What dx code for the free t4?

## 2014-07-05 DIAGNOSIS — N186 End stage renal disease: Secondary | ICD-10-CM | POA: Diagnosis not present

## 2014-07-05 DIAGNOSIS — D509 Iron deficiency anemia, unspecified: Secondary | ICD-10-CM | POA: Diagnosis not present

## 2014-07-05 DIAGNOSIS — Z992 Dependence on renal dialysis: Secondary | ICD-10-CM | POA: Diagnosis not present

## 2014-07-06 DIAGNOSIS — N186 End stage renal disease: Secondary | ICD-10-CM | POA: Diagnosis not present

## 2014-07-06 DIAGNOSIS — D509 Iron deficiency anemia, unspecified: Secondary | ICD-10-CM | POA: Diagnosis not present

## 2014-07-06 DIAGNOSIS — Z992 Dependence on renal dialysis: Secondary | ICD-10-CM | POA: Diagnosis not present

## 2014-07-07 DIAGNOSIS — Z992 Dependence on renal dialysis: Secondary | ICD-10-CM | POA: Diagnosis not present

## 2014-07-07 DIAGNOSIS — D509 Iron deficiency anemia, unspecified: Secondary | ICD-10-CM | POA: Diagnosis not present

## 2014-07-07 DIAGNOSIS — N186 End stage renal disease: Secondary | ICD-10-CM | POA: Diagnosis not present

## 2014-07-09 DIAGNOSIS — Z992 Dependence on renal dialysis: Secondary | ICD-10-CM | POA: Diagnosis not present

## 2014-07-09 DIAGNOSIS — D509 Iron deficiency anemia, unspecified: Secondary | ICD-10-CM | POA: Diagnosis not present

## 2014-07-09 DIAGNOSIS — N186 End stage renal disease: Secondary | ICD-10-CM | POA: Diagnosis not present

## 2014-07-10 DIAGNOSIS — Z992 Dependence on renal dialysis: Secondary | ICD-10-CM | POA: Diagnosis not present

## 2014-07-10 DIAGNOSIS — D509 Iron deficiency anemia, unspecified: Secondary | ICD-10-CM | POA: Diagnosis not present

## 2014-07-10 DIAGNOSIS — N186 End stage renal disease: Secondary | ICD-10-CM | POA: Diagnosis not present

## 2014-07-12 DIAGNOSIS — N186 End stage renal disease: Secondary | ICD-10-CM | POA: Diagnosis not present

## 2014-07-12 DIAGNOSIS — D509 Iron deficiency anemia, unspecified: Secondary | ICD-10-CM | POA: Diagnosis not present

## 2014-07-12 DIAGNOSIS — Z992 Dependence on renal dialysis: Secondary | ICD-10-CM | POA: Diagnosis not present

## 2014-07-13 DIAGNOSIS — Z992 Dependence on renal dialysis: Secondary | ICD-10-CM | POA: Diagnosis not present

## 2014-07-13 DIAGNOSIS — D509 Iron deficiency anemia, unspecified: Secondary | ICD-10-CM | POA: Diagnosis not present

## 2014-07-13 DIAGNOSIS — N186 End stage renal disease: Secondary | ICD-10-CM | POA: Diagnosis not present

## 2014-07-14 DIAGNOSIS — N186 End stage renal disease: Secondary | ICD-10-CM | POA: Diagnosis not present

## 2014-07-14 DIAGNOSIS — D509 Iron deficiency anemia, unspecified: Secondary | ICD-10-CM | POA: Diagnosis not present

## 2014-07-14 DIAGNOSIS — Z992 Dependence on renal dialysis: Secondary | ICD-10-CM | POA: Diagnosis not present

## 2014-07-16 DIAGNOSIS — N186 End stage renal disease: Secondary | ICD-10-CM | POA: Diagnosis not present

## 2014-07-16 DIAGNOSIS — D509 Iron deficiency anemia, unspecified: Secondary | ICD-10-CM | POA: Diagnosis not present

## 2014-07-16 DIAGNOSIS — Z992 Dependence on renal dialysis: Secondary | ICD-10-CM | POA: Diagnosis not present

## 2014-07-17 DIAGNOSIS — Z992 Dependence on renal dialysis: Secondary | ICD-10-CM | POA: Diagnosis not present

## 2014-07-17 DIAGNOSIS — D509 Iron deficiency anemia, unspecified: Secondary | ICD-10-CM | POA: Diagnosis not present

## 2014-07-17 DIAGNOSIS — N186 End stage renal disease: Secondary | ICD-10-CM | POA: Diagnosis not present

## 2014-07-19 DIAGNOSIS — D509 Iron deficiency anemia, unspecified: Secondary | ICD-10-CM | POA: Diagnosis not present

## 2014-07-19 DIAGNOSIS — Z992 Dependence on renal dialysis: Secondary | ICD-10-CM | POA: Diagnosis not present

## 2014-07-19 DIAGNOSIS — N186 End stage renal disease: Secondary | ICD-10-CM | POA: Diagnosis not present

## 2014-07-20 DIAGNOSIS — D509 Iron deficiency anemia, unspecified: Secondary | ICD-10-CM | POA: Diagnosis not present

## 2014-07-20 DIAGNOSIS — N186 End stage renal disease: Secondary | ICD-10-CM | POA: Diagnosis not present

## 2014-07-20 DIAGNOSIS — Z992 Dependence on renal dialysis: Secondary | ICD-10-CM | POA: Diagnosis not present

## 2014-07-21 DIAGNOSIS — D509 Iron deficiency anemia, unspecified: Secondary | ICD-10-CM | POA: Diagnosis not present

## 2014-07-21 DIAGNOSIS — Z992 Dependence on renal dialysis: Secondary | ICD-10-CM | POA: Diagnosis not present

## 2014-07-21 DIAGNOSIS — N186 End stage renal disease: Secondary | ICD-10-CM | POA: Diagnosis not present

## 2014-07-23 DIAGNOSIS — N186 End stage renal disease: Secondary | ICD-10-CM | POA: Diagnosis not present

## 2014-07-23 DIAGNOSIS — Z992 Dependence on renal dialysis: Secondary | ICD-10-CM | POA: Diagnosis not present

## 2014-07-23 DIAGNOSIS — D509 Iron deficiency anemia, unspecified: Secondary | ICD-10-CM | POA: Diagnosis not present

## 2014-07-24 DIAGNOSIS — D509 Iron deficiency anemia, unspecified: Secondary | ICD-10-CM | POA: Diagnosis not present

## 2014-07-24 DIAGNOSIS — Z992 Dependence on renal dialysis: Secondary | ICD-10-CM | POA: Diagnosis not present

## 2014-07-24 DIAGNOSIS — N186 End stage renal disease: Secondary | ICD-10-CM | POA: Diagnosis not present

## 2014-07-26 DIAGNOSIS — N186 End stage renal disease: Secondary | ICD-10-CM | POA: Diagnosis not present

## 2014-07-26 DIAGNOSIS — Z992 Dependence on renal dialysis: Secondary | ICD-10-CM | POA: Diagnosis not present

## 2014-07-26 DIAGNOSIS — D509 Iron deficiency anemia, unspecified: Secondary | ICD-10-CM | POA: Diagnosis not present

## 2014-07-27 DIAGNOSIS — D509 Iron deficiency anemia, unspecified: Secondary | ICD-10-CM | POA: Diagnosis not present

## 2014-07-27 DIAGNOSIS — N186 End stage renal disease: Secondary | ICD-10-CM | POA: Diagnosis not present

## 2014-07-27 DIAGNOSIS — Z992 Dependence on renal dialysis: Secondary | ICD-10-CM | POA: Diagnosis not present

## 2014-07-28 DIAGNOSIS — N186 End stage renal disease: Secondary | ICD-10-CM | POA: Diagnosis not present

## 2014-07-28 DIAGNOSIS — D509 Iron deficiency anemia, unspecified: Secondary | ICD-10-CM | POA: Diagnosis not present

## 2014-07-28 DIAGNOSIS — Z992 Dependence on renal dialysis: Secondary | ICD-10-CM | POA: Diagnosis not present

## 2014-07-30 DIAGNOSIS — Z992 Dependence on renal dialysis: Secondary | ICD-10-CM | POA: Diagnosis not present

## 2014-07-30 DIAGNOSIS — D509 Iron deficiency anemia, unspecified: Secondary | ICD-10-CM | POA: Diagnosis not present

## 2014-07-30 DIAGNOSIS — N186 End stage renal disease: Secondary | ICD-10-CM | POA: Diagnosis not present

## 2014-07-31 DIAGNOSIS — Z992 Dependence on renal dialysis: Secondary | ICD-10-CM | POA: Diagnosis not present

## 2014-07-31 DIAGNOSIS — N186 End stage renal disease: Secondary | ICD-10-CM | POA: Diagnosis not present

## 2014-07-31 DIAGNOSIS — D509 Iron deficiency anemia, unspecified: Secondary | ICD-10-CM | POA: Diagnosis not present

## 2014-08-01 DIAGNOSIS — N186 End stage renal disease: Secondary | ICD-10-CM | POA: Diagnosis not present

## 2014-08-01 DIAGNOSIS — D509 Iron deficiency anemia, unspecified: Secondary | ICD-10-CM | POA: Diagnosis not present

## 2014-08-01 DIAGNOSIS — Z992 Dependence on renal dialysis: Secondary | ICD-10-CM | POA: Diagnosis not present

## 2014-08-02 DIAGNOSIS — Z992 Dependence on renal dialysis: Secondary | ICD-10-CM | POA: Diagnosis not present

## 2014-08-02 DIAGNOSIS — D509 Iron deficiency anemia, unspecified: Secondary | ICD-10-CM | POA: Diagnosis not present

## 2014-08-02 DIAGNOSIS — N186 End stage renal disease: Secondary | ICD-10-CM | POA: Diagnosis not present

## 2014-08-03 DIAGNOSIS — D509 Iron deficiency anemia, unspecified: Secondary | ICD-10-CM | POA: Diagnosis not present

## 2014-08-03 DIAGNOSIS — Z992 Dependence on renal dialysis: Secondary | ICD-10-CM | POA: Diagnosis not present

## 2014-08-03 DIAGNOSIS — N186 End stage renal disease: Secondary | ICD-10-CM | POA: Diagnosis not present

## 2014-08-04 DIAGNOSIS — Z992 Dependence on renal dialysis: Secondary | ICD-10-CM | POA: Diagnosis not present

## 2014-08-04 DIAGNOSIS — D509 Iron deficiency anemia, unspecified: Secondary | ICD-10-CM | POA: Diagnosis not present

## 2014-08-04 DIAGNOSIS — N186 End stage renal disease: Secondary | ICD-10-CM | POA: Diagnosis not present

## 2014-08-06 DIAGNOSIS — D509 Iron deficiency anemia, unspecified: Secondary | ICD-10-CM | POA: Diagnosis not present

## 2014-08-06 DIAGNOSIS — Z992 Dependence on renal dialysis: Secondary | ICD-10-CM | POA: Diagnosis not present

## 2014-08-06 DIAGNOSIS — H6121 Impacted cerumen, right ear: Secondary | ICD-10-CM | POA: Diagnosis not present

## 2014-08-06 DIAGNOSIS — H60541 Acute eczematoid otitis externa, right ear: Secondary | ICD-10-CM | POA: Diagnosis not present

## 2014-08-06 DIAGNOSIS — N186 End stage renal disease: Secondary | ICD-10-CM | POA: Diagnosis not present

## 2014-08-06 DIAGNOSIS — H903 Sensorineural hearing loss, bilateral: Secondary | ICD-10-CM | POA: Diagnosis not present

## 2014-08-07 DIAGNOSIS — D509 Iron deficiency anemia, unspecified: Secondary | ICD-10-CM | POA: Diagnosis not present

## 2014-08-07 DIAGNOSIS — N186 End stage renal disease: Secondary | ICD-10-CM | POA: Diagnosis not present

## 2014-08-07 DIAGNOSIS — Z992 Dependence on renal dialysis: Secondary | ICD-10-CM | POA: Diagnosis not present

## 2014-08-09 DIAGNOSIS — Z992 Dependence on renal dialysis: Secondary | ICD-10-CM | POA: Diagnosis not present

## 2014-08-09 DIAGNOSIS — D509 Iron deficiency anemia, unspecified: Secondary | ICD-10-CM | POA: Diagnosis not present

## 2014-08-09 DIAGNOSIS — N186 End stage renal disease: Secondary | ICD-10-CM | POA: Diagnosis not present

## 2014-08-10 DIAGNOSIS — Z992 Dependence on renal dialysis: Secondary | ICD-10-CM | POA: Diagnosis not present

## 2014-08-10 DIAGNOSIS — D509 Iron deficiency anemia, unspecified: Secondary | ICD-10-CM | POA: Diagnosis not present

## 2014-08-10 DIAGNOSIS — N186 End stage renal disease: Secondary | ICD-10-CM | POA: Diagnosis not present

## 2014-08-11 DIAGNOSIS — N186 End stage renal disease: Secondary | ICD-10-CM | POA: Diagnosis not present

## 2014-08-11 DIAGNOSIS — D509 Iron deficiency anemia, unspecified: Secondary | ICD-10-CM | POA: Diagnosis not present

## 2014-08-11 DIAGNOSIS — Z992 Dependence on renal dialysis: Secondary | ICD-10-CM | POA: Diagnosis not present

## 2014-08-13 DIAGNOSIS — N186 End stage renal disease: Secondary | ICD-10-CM | POA: Diagnosis not present

## 2014-08-13 DIAGNOSIS — D509 Iron deficiency anemia, unspecified: Secondary | ICD-10-CM | POA: Diagnosis not present

## 2014-08-13 DIAGNOSIS — Z992 Dependence on renal dialysis: Secondary | ICD-10-CM | POA: Diagnosis not present

## 2014-08-14 DIAGNOSIS — D509 Iron deficiency anemia, unspecified: Secondary | ICD-10-CM | POA: Diagnosis not present

## 2014-08-14 DIAGNOSIS — Z992 Dependence on renal dialysis: Secondary | ICD-10-CM | POA: Diagnosis not present

## 2014-08-14 DIAGNOSIS — N186 End stage renal disease: Secondary | ICD-10-CM | POA: Diagnosis not present

## 2014-08-16 DIAGNOSIS — D509 Iron deficiency anemia, unspecified: Secondary | ICD-10-CM | POA: Diagnosis not present

## 2014-08-16 DIAGNOSIS — Z08 Encounter for follow-up examination after completed treatment for malignant neoplasm: Secondary | ICD-10-CM | POA: Diagnosis not present

## 2014-08-16 DIAGNOSIS — Z992 Dependence on renal dialysis: Secondary | ICD-10-CM | POA: Diagnosis not present

## 2014-08-16 DIAGNOSIS — Z85828 Personal history of other malignant neoplasm of skin: Secondary | ICD-10-CM | POA: Diagnosis not present

## 2014-08-16 DIAGNOSIS — C44619 Basal cell carcinoma of skin of left upper limb, including shoulder: Secondary | ICD-10-CM | POA: Diagnosis not present

## 2014-08-16 DIAGNOSIS — L821 Other seborrheic keratosis: Secondary | ICD-10-CM | POA: Diagnosis not present

## 2014-08-16 DIAGNOSIS — C4441 Basal cell carcinoma of skin of scalp and neck: Secondary | ICD-10-CM | POA: Diagnosis not present

## 2014-08-16 DIAGNOSIS — D485 Neoplasm of uncertain behavior of skin: Secondary | ICD-10-CM | POA: Diagnosis not present

## 2014-08-16 DIAGNOSIS — N186 End stage renal disease: Secondary | ICD-10-CM | POA: Diagnosis not present

## 2014-08-17 DIAGNOSIS — D509 Iron deficiency anemia, unspecified: Secondary | ICD-10-CM | POA: Diagnosis not present

## 2014-08-17 DIAGNOSIS — N186 End stage renal disease: Secondary | ICD-10-CM | POA: Diagnosis not present

## 2014-08-17 DIAGNOSIS — Z992 Dependence on renal dialysis: Secondary | ICD-10-CM | POA: Diagnosis not present

## 2014-08-18 DIAGNOSIS — Z992 Dependence on renal dialysis: Secondary | ICD-10-CM | POA: Diagnosis not present

## 2014-08-18 DIAGNOSIS — D509 Iron deficiency anemia, unspecified: Secondary | ICD-10-CM | POA: Diagnosis not present

## 2014-08-18 DIAGNOSIS — N186 End stage renal disease: Secondary | ICD-10-CM | POA: Diagnosis not present

## 2014-08-20 DIAGNOSIS — D509 Iron deficiency anemia, unspecified: Secondary | ICD-10-CM | POA: Diagnosis not present

## 2014-08-20 DIAGNOSIS — N186 End stage renal disease: Secondary | ICD-10-CM | POA: Diagnosis not present

## 2014-08-20 DIAGNOSIS — Z992 Dependence on renal dialysis: Secondary | ICD-10-CM | POA: Diagnosis not present

## 2014-08-21 DIAGNOSIS — D509 Iron deficiency anemia, unspecified: Secondary | ICD-10-CM | POA: Diagnosis not present

## 2014-08-21 DIAGNOSIS — Z992 Dependence on renal dialysis: Secondary | ICD-10-CM | POA: Diagnosis not present

## 2014-08-21 DIAGNOSIS — N186 End stage renal disease: Secondary | ICD-10-CM | POA: Diagnosis not present

## 2014-08-23 DIAGNOSIS — Z992 Dependence on renal dialysis: Secondary | ICD-10-CM | POA: Diagnosis not present

## 2014-08-23 DIAGNOSIS — N186 End stage renal disease: Secondary | ICD-10-CM | POA: Diagnosis not present

## 2014-08-23 DIAGNOSIS — D509 Iron deficiency anemia, unspecified: Secondary | ICD-10-CM | POA: Diagnosis not present

## 2014-08-24 DIAGNOSIS — Z992 Dependence on renal dialysis: Secondary | ICD-10-CM | POA: Diagnosis not present

## 2014-08-24 DIAGNOSIS — D509 Iron deficiency anemia, unspecified: Secondary | ICD-10-CM | POA: Diagnosis not present

## 2014-08-24 DIAGNOSIS — N186 End stage renal disease: Secondary | ICD-10-CM | POA: Diagnosis not present

## 2014-08-25 DIAGNOSIS — D509 Iron deficiency anemia, unspecified: Secondary | ICD-10-CM | POA: Diagnosis not present

## 2014-08-25 DIAGNOSIS — N186 End stage renal disease: Secondary | ICD-10-CM | POA: Diagnosis not present

## 2014-08-25 DIAGNOSIS — Z992 Dependence on renal dialysis: Secondary | ICD-10-CM | POA: Diagnosis not present

## 2014-08-27 DIAGNOSIS — Z992 Dependence on renal dialysis: Secondary | ICD-10-CM | POA: Diagnosis not present

## 2014-08-27 DIAGNOSIS — D509 Iron deficiency anemia, unspecified: Secondary | ICD-10-CM | POA: Diagnosis not present

## 2014-08-27 DIAGNOSIS — N2581 Secondary hyperparathyroidism of renal origin: Secondary | ICD-10-CM | POA: Diagnosis not present

## 2014-08-27 DIAGNOSIS — N186 End stage renal disease: Secondary | ICD-10-CM | POA: Diagnosis not present

## 2014-08-27 DIAGNOSIS — E559 Vitamin D deficiency, unspecified: Secondary | ICD-10-CM | POA: Diagnosis not present

## 2014-08-28 DIAGNOSIS — N2581 Secondary hyperparathyroidism of renal origin: Secondary | ICD-10-CM | POA: Diagnosis not present

## 2014-08-28 DIAGNOSIS — D509 Iron deficiency anemia, unspecified: Secondary | ICD-10-CM | POA: Diagnosis not present

## 2014-08-28 DIAGNOSIS — Z992 Dependence on renal dialysis: Secondary | ICD-10-CM | POA: Diagnosis not present

## 2014-08-28 DIAGNOSIS — E559 Vitamin D deficiency, unspecified: Secondary | ICD-10-CM | POA: Diagnosis not present

## 2014-08-28 DIAGNOSIS — N186 End stage renal disease: Secondary | ICD-10-CM | POA: Diagnosis not present

## 2014-08-30 DIAGNOSIS — D509 Iron deficiency anemia, unspecified: Secondary | ICD-10-CM | POA: Diagnosis not present

## 2014-08-30 DIAGNOSIS — Z992 Dependence on renal dialysis: Secondary | ICD-10-CM | POA: Diagnosis not present

## 2014-08-30 DIAGNOSIS — N186 End stage renal disease: Secondary | ICD-10-CM | POA: Diagnosis not present

## 2014-08-30 DIAGNOSIS — N2581 Secondary hyperparathyroidism of renal origin: Secondary | ICD-10-CM | POA: Diagnosis not present

## 2014-08-30 DIAGNOSIS — E559 Vitamin D deficiency, unspecified: Secondary | ICD-10-CM | POA: Diagnosis not present

## 2014-08-31 DIAGNOSIS — Z992 Dependence on renal dialysis: Secondary | ICD-10-CM | POA: Diagnosis not present

## 2014-08-31 DIAGNOSIS — D509 Iron deficiency anemia, unspecified: Secondary | ICD-10-CM | POA: Diagnosis not present

## 2014-08-31 DIAGNOSIS — N186 End stage renal disease: Secondary | ICD-10-CM | POA: Diagnosis not present

## 2014-08-31 DIAGNOSIS — E559 Vitamin D deficiency, unspecified: Secondary | ICD-10-CM | POA: Diagnosis not present

## 2014-08-31 DIAGNOSIS — N2581 Secondary hyperparathyroidism of renal origin: Secondary | ICD-10-CM | POA: Diagnosis not present

## 2014-09-01 DIAGNOSIS — E559 Vitamin D deficiency, unspecified: Secondary | ICD-10-CM | POA: Diagnosis not present

## 2014-09-01 DIAGNOSIS — D509 Iron deficiency anemia, unspecified: Secondary | ICD-10-CM | POA: Diagnosis not present

## 2014-09-01 DIAGNOSIS — N186 End stage renal disease: Secondary | ICD-10-CM | POA: Diagnosis not present

## 2014-09-01 DIAGNOSIS — Z992 Dependence on renal dialysis: Secondary | ICD-10-CM | POA: Diagnosis not present

## 2014-09-01 DIAGNOSIS — N2581 Secondary hyperparathyroidism of renal origin: Secondary | ICD-10-CM | POA: Diagnosis not present

## 2014-09-03 DIAGNOSIS — N186 End stage renal disease: Secondary | ICD-10-CM | POA: Diagnosis not present

## 2014-09-03 DIAGNOSIS — D509 Iron deficiency anemia, unspecified: Secondary | ICD-10-CM | POA: Diagnosis not present

## 2014-09-03 DIAGNOSIS — E559 Vitamin D deficiency, unspecified: Secondary | ICD-10-CM | POA: Diagnosis not present

## 2014-09-03 DIAGNOSIS — N2581 Secondary hyperparathyroidism of renal origin: Secondary | ICD-10-CM | POA: Diagnosis not present

## 2014-09-03 DIAGNOSIS — Z992 Dependence on renal dialysis: Secondary | ICD-10-CM | POA: Diagnosis not present

## 2014-09-03 LAB — BASIC METABOLIC PANEL
BUN: 40 mg/dL — AB (ref 4–21)
Creatinine: 2.2 mg/dL — AB (ref 0.5–1.1)

## 2014-09-03 LAB — LIPID PANEL
Cholesterol: 283 mg/dL — AB (ref 0–200)
HDL: 72 mg/dL — AB (ref 35–70)
LDL CALC: 173 mg/dL
TRIGLYCERIDES: 190 mg/dL — AB (ref 40–160)

## 2014-09-03 LAB — CBC AND DIFFERENTIAL
HEMOGLOBIN: 12.9 g/dL (ref 12.0–16.0)
WBC: 6 10^3/mL

## 2014-09-04 DIAGNOSIS — N2581 Secondary hyperparathyroidism of renal origin: Secondary | ICD-10-CM | POA: Diagnosis not present

## 2014-09-04 DIAGNOSIS — Z992 Dependence on renal dialysis: Secondary | ICD-10-CM | POA: Diagnosis not present

## 2014-09-04 DIAGNOSIS — D509 Iron deficiency anemia, unspecified: Secondary | ICD-10-CM | POA: Diagnosis not present

## 2014-09-04 DIAGNOSIS — E559 Vitamin D deficiency, unspecified: Secondary | ICD-10-CM | POA: Diagnosis not present

## 2014-09-04 DIAGNOSIS — N186 End stage renal disease: Secondary | ICD-10-CM | POA: Diagnosis not present

## 2014-09-06 DIAGNOSIS — N186 End stage renal disease: Secondary | ICD-10-CM | POA: Diagnosis not present

## 2014-09-06 DIAGNOSIS — C4441 Basal cell carcinoma of skin of scalp and neck: Secondary | ICD-10-CM | POA: Diagnosis not present

## 2014-09-06 DIAGNOSIS — D509 Iron deficiency anemia, unspecified: Secondary | ICD-10-CM | POA: Diagnosis not present

## 2014-09-06 DIAGNOSIS — C44619 Basal cell carcinoma of skin of left upper limb, including shoulder: Secondary | ICD-10-CM | POA: Diagnosis not present

## 2014-09-06 DIAGNOSIS — E559 Vitamin D deficiency, unspecified: Secondary | ICD-10-CM | POA: Diagnosis not present

## 2014-09-06 DIAGNOSIS — N2581 Secondary hyperparathyroidism of renal origin: Secondary | ICD-10-CM | POA: Diagnosis not present

## 2014-09-06 DIAGNOSIS — Z992 Dependence on renal dialysis: Secondary | ICD-10-CM | POA: Diagnosis not present

## 2014-09-07 DIAGNOSIS — D509 Iron deficiency anemia, unspecified: Secondary | ICD-10-CM | POA: Diagnosis not present

## 2014-09-07 DIAGNOSIS — E559 Vitamin D deficiency, unspecified: Secondary | ICD-10-CM | POA: Diagnosis not present

## 2014-09-07 DIAGNOSIS — N2581 Secondary hyperparathyroidism of renal origin: Secondary | ICD-10-CM | POA: Diagnosis not present

## 2014-09-07 DIAGNOSIS — N186 End stage renal disease: Secondary | ICD-10-CM | POA: Diagnosis not present

## 2014-09-07 DIAGNOSIS — Z992 Dependence on renal dialysis: Secondary | ICD-10-CM | POA: Diagnosis not present

## 2014-09-08 DIAGNOSIS — N186 End stage renal disease: Secondary | ICD-10-CM | POA: Diagnosis not present

## 2014-09-08 DIAGNOSIS — Z992 Dependence on renal dialysis: Secondary | ICD-10-CM | POA: Diagnosis not present

## 2014-09-08 DIAGNOSIS — D509 Iron deficiency anemia, unspecified: Secondary | ICD-10-CM | POA: Diagnosis not present

## 2014-09-08 DIAGNOSIS — N2581 Secondary hyperparathyroidism of renal origin: Secondary | ICD-10-CM | POA: Diagnosis not present

## 2014-09-08 DIAGNOSIS — E559 Vitamin D deficiency, unspecified: Secondary | ICD-10-CM | POA: Diagnosis not present

## 2014-09-10 DIAGNOSIS — D509 Iron deficiency anemia, unspecified: Secondary | ICD-10-CM | POA: Diagnosis not present

## 2014-09-10 DIAGNOSIS — Z992 Dependence on renal dialysis: Secondary | ICD-10-CM | POA: Diagnosis not present

## 2014-09-10 DIAGNOSIS — N2581 Secondary hyperparathyroidism of renal origin: Secondary | ICD-10-CM | POA: Diagnosis not present

## 2014-09-10 DIAGNOSIS — N186 End stage renal disease: Secondary | ICD-10-CM | POA: Diagnosis not present

## 2014-09-10 DIAGNOSIS — E559 Vitamin D deficiency, unspecified: Secondary | ICD-10-CM | POA: Diagnosis not present

## 2014-09-11 DIAGNOSIS — N186 End stage renal disease: Secondary | ICD-10-CM | POA: Diagnosis not present

## 2014-09-11 DIAGNOSIS — N2581 Secondary hyperparathyroidism of renal origin: Secondary | ICD-10-CM | POA: Diagnosis not present

## 2014-09-11 DIAGNOSIS — Z992 Dependence on renal dialysis: Secondary | ICD-10-CM | POA: Diagnosis not present

## 2014-09-11 DIAGNOSIS — D509 Iron deficiency anemia, unspecified: Secondary | ICD-10-CM | POA: Diagnosis not present

## 2014-09-11 DIAGNOSIS — E559 Vitamin D deficiency, unspecified: Secondary | ICD-10-CM | POA: Diagnosis not present

## 2014-09-13 DIAGNOSIS — I10 Essential (primary) hypertension: Secondary | ICD-10-CM | POA: Diagnosis not present

## 2014-09-13 DIAGNOSIS — I25118 Atherosclerotic heart disease of native coronary artery with other forms of angina pectoris: Secondary | ICD-10-CM | POA: Diagnosis not present

## 2014-09-13 DIAGNOSIS — N186 End stage renal disease: Secondary | ICD-10-CM | POA: Diagnosis not present

## 2014-09-13 DIAGNOSIS — D509 Iron deficiency anemia, unspecified: Secondary | ICD-10-CM | POA: Diagnosis not present

## 2014-09-13 DIAGNOSIS — R0681 Apnea, not elsewhere classified: Secondary | ICD-10-CM | POA: Insufficient documentation

## 2014-09-13 DIAGNOSIS — I5022 Chronic systolic (congestive) heart failure: Secondary | ICD-10-CM | POA: Diagnosis not present

## 2014-09-13 DIAGNOSIS — E559 Vitamin D deficiency, unspecified: Secondary | ICD-10-CM | POA: Diagnosis not present

## 2014-09-13 DIAGNOSIS — Z992 Dependence on renal dialysis: Secondary | ICD-10-CM | POA: Diagnosis not present

## 2014-09-13 DIAGNOSIS — N2581 Secondary hyperparathyroidism of renal origin: Secondary | ICD-10-CM | POA: Diagnosis not present

## 2014-09-13 DIAGNOSIS — R0602 Shortness of breath: Secondary | ICD-10-CM | POA: Diagnosis not present

## 2014-09-14 DIAGNOSIS — E559 Vitamin D deficiency, unspecified: Secondary | ICD-10-CM | POA: Diagnosis not present

## 2014-09-14 DIAGNOSIS — N186 End stage renal disease: Secondary | ICD-10-CM | POA: Diagnosis not present

## 2014-09-14 DIAGNOSIS — Z992 Dependence on renal dialysis: Secondary | ICD-10-CM | POA: Diagnosis not present

## 2014-09-14 DIAGNOSIS — D509 Iron deficiency anemia, unspecified: Secondary | ICD-10-CM | POA: Diagnosis not present

## 2014-09-14 DIAGNOSIS — N2581 Secondary hyperparathyroidism of renal origin: Secondary | ICD-10-CM | POA: Diagnosis not present

## 2014-09-15 DIAGNOSIS — D509 Iron deficiency anemia, unspecified: Secondary | ICD-10-CM | POA: Diagnosis not present

## 2014-09-15 DIAGNOSIS — N2581 Secondary hyperparathyroidism of renal origin: Secondary | ICD-10-CM | POA: Diagnosis not present

## 2014-09-15 DIAGNOSIS — N186 End stage renal disease: Secondary | ICD-10-CM | POA: Diagnosis not present

## 2014-09-15 DIAGNOSIS — Z992 Dependence on renal dialysis: Secondary | ICD-10-CM | POA: Diagnosis not present

## 2014-09-15 DIAGNOSIS — E559 Vitamin D deficiency, unspecified: Secondary | ICD-10-CM | POA: Diagnosis not present

## 2014-09-17 DIAGNOSIS — N186 End stage renal disease: Secondary | ICD-10-CM | POA: Diagnosis not present

## 2014-09-17 DIAGNOSIS — Z992 Dependence on renal dialysis: Secondary | ICD-10-CM | POA: Diagnosis not present

## 2014-09-17 DIAGNOSIS — E559 Vitamin D deficiency, unspecified: Secondary | ICD-10-CM | POA: Diagnosis not present

## 2014-09-17 DIAGNOSIS — N2581 Secondary hyperparathyroidism of renal origin: Secondary | ICD-10-CM | POA: Diagnosis not present

## 2014-09-17 DIAGNOSIS — D509 Iron deficiency anemia, unspecified: Secondary | ICD-10-CM | POA: Diagnosis not present

## 2014-09-18 DIAGNOSIS — E559 Vitamin D deficiency, unspecified: Secondary | ICD-10-CM | POA: Diagnosis not present

## 2014-09-18 DIAGNOSIS — N2581 Secondary hyperparathyroidism of renal origin: Secondary | ICD-10-CM | POA: Diagnosis not present

## 2014-09-18 DIAGNOSIS — D509 Iron deficiency anemia, unspecified: Secondary | ICD-10-CM | POA: Diagnosis not present

## 2014-09-18 DIAGNOSIS — N186 End stage renal disease: Secondary | ICD-10-CM | POA: Diagnosis not present

## 2014-09-18 DIAGNOSIS — Z992 Dependence on renal dialysis: Secondary | ICD-10-CM | POA: Diagnosis not present

## 2014-09-20 DIAGNOSIS — E559 Vitamin D deficiency, unspecified: Secondary | ICD-10-CM | POA: Diagnosis not present

## 2014-09-20 DIAGNOSIS — D509 Iron deficiency anemia, unspecified: Secondary | ICD-10-CM | POA: Diagnosis not present

## 2014-09-20 DIAGNOSIS — Z992 Dependence on renal dialysis: Secondary | ICD-10-CM | POA: Diagnosis not present

## 2014-09-20 DIAGNOSIS — N2581 Secondary hyperparathyroidism of renal origin: Secondary | ICD-10-CM | POA: Diagnosis not present

## 2014-09-20 DIAGNOSIS — N186 End stage renal disease: Secondary | ICD-10-CM | POA: Diagnosis not present

## 2014-09-21 DIAGNOSIS — D509 Iron deficiency anemia, unspecified: Secondary | ICD-10-CM | POA: Diagnosis not present

## 2014-09-21 DIAGNOSIS — Z992 Dependence on renal dialysis: Secondary | ICD-10-CM | POA: Diagnosis not present

## 2014-09-21 DIAGNOSIS — N2581 Secondary hyperparathyroidism of renal origin: Secondary | ICD-10-CM | POA: Diagnosis not present

## 2014-09-21 DIAGNOSIS — E559 Vitamin D deficiency, unspecified: Secondary | ICD-10-CM | POA: Diagnosis not present

## 2014-09-21 DIAGNOSIS — N186 End stage renal disease: Secondary | ICD-10-CM | POA: Diagnosis not present

## 2014-09-22 DIAGNOSIS — D509 Iron deficiency anemia, unspecified: Secondary | ICD-10-CM | POA: Diagnosis not present

## 2014-09-22 DIAGNOSIS — N186 End stage renal disease: Secondary | ICD-10-CM | POA: Diagnosis not present

## 2014-09-22 DIAGNOSIS — Z992 Dependence on renal dialysis: Secondary | ICD-10-CM | POA: Diagnosis not present

## 2014-09-22 DIAGNOSIS — N2581 Secondary hyperparathyroidism of renal origin: Secondary | ICD-10-CM | POA: Diagnosis not present

## 2014-09-22 DIAGNOSIS — E559 Vitamin D deficiency, unspecified: Secondary | ICD-10-CM | POA: Diagnosis not present

## 2014-09-24 DIAGNOSIS — N186 End stage renal disease: Secondary | ICD-10-CM | POA: Diagnosis not present

## 2014-09-24 DIAGNOSIS — Z992 Dependence on renal dialysis: Secondary | ICD-10-CM | POA: Diagnosis not present

## 2014-09-24 DIAGNOSIS — E559 Vitamin D deficiency, unspecified: Secondary | ICD-10-CM | POA: Diagnosis not present

## 2014-09-24 DIAGNOSIS — N2581 Secondary hyperparathyroidism of renal origin: Secondary | ICD-10-CM | POA: Diagnosis not present

## 2014-09-24 DIAGNOSIS — D509 Iron deficiency anemia, unspecified: Secondary | ICD-10-CM | POA: Diagnosis not present

## 2014-09-25 DIAGNOSIS — D509 Iron deficiency anemia, unspecified: Secondary | ICD-10-CM | POA: Diagnosis not present

## 2014-09-25 DIAGNOSIS — E559 Vitamin D deficiency, unspecified: Secondary | ICD-10-CM | POA: Diagnosis not present

## 2014-09-25 DIAGNOSIS — Z992 Dependence on renal dialysis: Secondary | ICD-10-CM | POA: Diagnosis not present

## 2014-09-25 DIAGNOSIS — N186 End stage renal disease: Secondary | ICD-10-CM | POA: Diagnosis not present

## 2014-09-25 DIAGNOSIS — N2581 Secondary hyperparathyroidism of renal origin: Secondary | ICD-10-CM | POA: Diagnosis not present

## 2014-09-26 DIAGNOSIS — I5022 Chronic systolic (congestive) heart failure: Secondary | ICD-10-CM | POA: Diagnosis not present

## 2014-09-26 DIAGNOSIS — R0602 Shortness of breath: Secondary | ICD-10-CM | POA: Diagnosis not present

## 2014-09-26 DIAGNOSIS — N185 Chronic kidney disease, stage 5: Secondary | ICD-10-CM | POA: Diagnosis not present

## 2014-09-26 DIAGNOSIS — E782 Mixed hyperlipidemia: Secondary | ICD-10-CM | POA: Diagnosis not present

## 2014-09-26 DIAGNOSIS — G4733 Obstructive sleep apnea (adult) (pediatric): Secondary | ICD-10-CM | POA: Diagnosis not present

## 2014-09-27 DIAGNOSIS — Z23 Encounter for immunization: Secondary | ICD-10-CM | POA: Diagnosis not present

## 2014-09-27 DIAGNOSIS — Z992 Dependence on renal dialysis: Secondary | ICD-10-CM | POA: Diagnosis not present

## 2014-09-27 DIAGNOSIS — D509 Iron deficiency anemia, unspecified: Secondary | ICD-10-CM | POA: Diagnosis not present

## 2014-09-27 DIAGNOSIS — N186 End stage renal disease: Secondary | ICD-10-CM | POA: Diagnosis not present

## 2014-09-28 DIAGNOSIS — Z992 Dependence on renal dialysis: Secondary | ICD-10-CM | POA: Diagnosis not present

## 2014-09-28 DIAGNOSIS — D509 Iron deficiency anemia, unspecified: Secondary | ICD-10-CM | POA: Diagnosis not present

## 2014-09-28 DIAGNOSIS — N186 End stage renal disease: Secondary | ICD-10-CM | POA: Diagnosis not present

## 2014-09-28 DIAGNOSIS — Z23 Encounter for immunization: Secondary | ICD-10-CM | POA: Diagnosis not present

## 2014-09-29 DIAGNOSIS — D509 Iron deficiency anemia, unspecified: Secondary | ICD-10-CM | POA: Diagnosis not present

## 2014-09-29 DIAGNOSIS — Z23 Encounter for immunization: Secondary | ICD-10-CM | POA: Diagnosis not present

## 2014-09-29 DIAGNOSIS — Z992 Dependence on renal dialysis: Secondary | ICD-10-CM | POA: Diagnosis not present

## 2014-09-29 DIAGNOSIS — N186 End stage renal disease: Secondary | ICD-10-CM | POA: Diagnosis not present

## 2014-10-01 DIAGNOSIS — Z23 Encounter for immunization: Secondary | ICD-10-CM | POA: Diagnosis not present

## 2014-10-01 DIAGNOSIS — N186 End stage renal disease: Secondary | ICD-10-CM | POA: Diagnosis not present

## 2014-10-01 DIAGNOSIS — D509 Iron deficiency anemia, unspecified: Secondary | ICD-10-CM | POA: Diagnosis not present

## 2014-10-01 DIAGNOSIS — Z992 Dependence on renal dialysis: Secondary | ICD-10-CM | POA: Diagnosis not present

## 2014-10-02 DIAGNOSIS — N186 End stage renal disease: Secondary | ICD-10-CM | POA: Diagnosis not present

## 2014-10-02 DIAGNOSIS — D509 Iron deficiency anemia, unspecified: Secondary | ICD-10-CM | POA: Diagnosis not present

## 2014-10-02 DIAGNOSIS — Z992 Dependence on renal dialysis: Secondary | ICD-10-CM | POA: Diagnosis not present

## 2014-10-02 DIAGNOSIS — Z23 Encounter for immunization: Secondary | ICD-10-CM | POA: Diagnosis not present

## 2014-10-04 ENCOUNTER — Encounter: Payer: Self-pay | Admitting: Internal Medicine

## 2014-10-04 ENCOUNTER — Ambulatory Visit (INDEPENDENT_AMBULATORY_CARE_PROVIDER_SITE_OTHER): Payer: Medicare Other | Admitting: Internal Medicine

## 2014-10-04 VITALS — BP 137/68 | HR 81 | Temp 98.2°F | Ht 64.5 in | Wt 125.2 lb

## 2014-10-04 DIAGNOSIS — F411 Generalized anxiety disorder: Secondary | ICD-10-CM

## 2014-10-04 DIAGNOSIS — D509 Iron deficiency anemia, unspecified: Secondary | ICD-10-CM | POA: Diagnosis not present

## 2014-10-04 DIAGNOSIS — I5022 Chronic systolic (congestive) heart failure: Secondary | ICD-10-CM

## 2014-10-04 DIAGNOSIS — N185 Chronic kidney disease, stage 5: Secondary | ICD-10-CM

## 2014-10-04 DIAGNOSIS — I10 Essential (primary) hypertension: Secondary | ICD-10-CM

## 2014-10-04 DIAGNOSIS — Z992 Dependence on renal dialysis: Secondary | ICD-10-CM | POA: Diagnosis not present

## 2014-10-04 DIAGNOSIS — N186 End stage renal disease: Secondary | ICD-10-CM | POA: Diagnosis not present

## 2014-10-04 DIAGNOSIS — Z23 Encounter for immunization: Secondary | ICD-10-CM | POA: Diagnosis not present

## 2014-10-04 DIAGNOSIS — R4701 Aphasia: Secondary | ICD-10-CM | POA: Diagnosis not present

## 2014-10-04 MED ORDER — SERTRALINE HCL 50 MG PO TABS: 150.0000 mg | ORAL_TABLET | Freq: Every day | ORAL | Status: DC

## 2014-10-04 MED ORDER — TRAMADOL HCL 50 MG PO TABS: 25.0000 mg | ORAL_TABLET | Freq: Three times a day (TID) | ORAL | Status: DC | PRN

## 2014-10-04 NOTE — Progress Notes (Signed)
Pre visit review using our clinic review tool, if applicable. No additional management support is needed unless otherwise documented below in the visit note. 

## 2014-10-04 NOTE — Progress Notes (Signed)
Subjective:    Patient ID: Sharon [name], female    DOB: 1927/01/01, 81 y.o.   MRN: [id number]  HPI  81YO female presents for follow up.  Last seen in June 2016. Rough summer for her. Not feeling well. Generally fatigued. ECHO showed EF 20%. No changes made to medication regimen by cardiology. Having some sudden drops in BP. Cardiology has been monitoring pacemaker. Having some trouble with word finding recently. CT head one year ago showed no acute changes.  Also seen by ENT recently, and exam was normal. Recommended using a walker.  Great nephew currently living with her. Wears alert device at home. Stopped driving. Family providing transportation.    Wt Readings from Last 3 Encounters:  10/04/14 125 lb 4 oz (56.813 kg)  07/03/14 123 lb (55.792 kg)  06/22/14 122 lb 4 oz (55.452 kg)   BP Readings from Last 3 Encounters:  10/04/14 137/68  07/03/14 116/65  06/22/14 124/62     Past Medical History  Diagnosis Date  . Peritoneal dialysis status   . Meniere disease   . Meniere's disease   . Kidney failure July 2012  . Kidney failure    Family History  Problem Relation Age of Onset  . Cancer Mother     ? ovarian - sarcoma  . Cancer Father     Skin cancer  . Diabetes Sister   . COPD Sister   . Depression Sister   . Cancer Sister     Lung - 60 yrs old   Past Surgical History  Procedure Laterality Date  . Pacemaker insertion  2006  . Cholecystectomy  01/2008  . Total knee arthroplasty  2008    LEFT/Dr Calif  . Cataract extraction  2006, 2011  . Insertion of dialysis catheter  07/2010  . Abdominal adhesion surgery  1958   Social History   Social History  . Marital Status: Married    Spouse Name: N/A  . Number of Children: 0  . Years of Education: N/A   Occupational History  . Retired     Social History Main Topics  . Smoking status: Former Smoker    Quit date: 03/31/1948  . Smokeless tobacco: Never Used  . Alcohol Use: No  . Drug Use: No  .  Sexual Activity: Not Asked   Other Topics Concern  . None   Social History Narrative   Lives in [place] alone. Widow 2012.      Regular Exercise -  Housework   Daily Caffeine Use:  1 - 2 cups coffee             Review of Systems  Constitutional: Positive for fatigue. Negative for fever, chills, appetite change and unexpected weight change.  Eyes: Negative for visual disturbance.  Respiratory: Positive for shortness of breath. Negative for cough and wheezing.   Cardiovascular: Negative for chest pain and leg swelling.  Gastrointestinal: Negative for nausea, vomiting, abdominal pain, diarrhea and constipation.  Musculoskeletal: Positive for myalgias.  Skin: Negative for color change and rash.  Hematological: Negative for adenopathy. Does not bruise/bleed easily.  Psychiatric/Behavioral: Negative for dysphoric mood. The patient is not nervous/anxious.        Objective:    BP 137/68 mmHg  Pulse 81  Temp(Src) 98.2 F (36.8 C) (Oral)  Ht 5' 4.5" (1.638 m)  Wt 125 lb 4 oz (56.813 kg)  BMI 21.17 kg/m2  SpO2 97% Physical Exam  Constitutional: She is oriented to person, place, and time. She appears well-developed  and well-nourished. No distress.  HENT:  Head: Normocephalic and atraumatic.  Right Ear: External ear normal.  Left Ear: External ear normal.  Nose: Nose normal.  Mouth/Throat: Oropharynx is clear and moist. No oropharyngeal exudate.  Eyes: Conjunctivae are normal. Pupils are equal, round, and reactive to light. Right eye exhibits no discharge. Left eye exhibits no discharge. No scleral icterus.  Neck: Normal range of motion. Neck supple. No tracheal deviation present. No thyromegaly present.  Cardiovascular: Normal rate, regular rhythm, normal heart sounds and intact distal pulses.  Exam reveals no gallop and no friction rub.   No murmur heard. Pulmonary/Chest: Effort normal and breath sounds normal. No respiratory distress. She has no wheezes. She has no rales.  She exhibits no tenderness.  Musculoskeletal: Normal range of motion. She exhibits no edema or tenderness.  Lymphadenopathy:    She has no cervical adenopathy.  Neurological: She is alert and oriented to person, place, and time. No cranial nerve deficit. She exhibits normal muscle tone. Coordination normal.  Skin: Skin is warm and dry. No rash noted. She is not diaphoretic. No erythema. No pallor.  Psychiatric: She has a normal mood and affect. Her behavior is normal. Judgment and thought content normal.          Assessment & Plan:   Problem List Items Addressed This Visit      Unprioritized   Anxiety state    Recent worsening of anxiety. Will increase Sertraline to 150mg  daily. Recheck in 4-6 weeks and prn.      Relevant Medications   sertraline (ZOLOFT) 50 MG tablet   Chronic kidney disease, stage 5, kidney failure    Continues on PD. Reviewed labs today.      Expressive aphasia    Symptoms c/w expressive aphasia. Discussed potential causes including stroke. She prefers not to get imaging at this time, as prefers no interventions.      Hypertension    BP Readings from Last 3 Encounters:  10/04/14 137/68  07/03/14 116/65  06/22/14 124/62   BP well controlled on Metoprolol.      Systolic heart failure, chronic - Primary    Recent ECHO shows severe LV dysfunction EF 20%. Reviewed notes from cardiology. Will continue Metoprolol.          Return in about 4 weeks (around 11/01/2014) for Recheck.

## 2014-10-04 NOTE — Assessment & Plan Note (Signed)
Symptoms c/w expressive aphasia. Discussed potential causes including stroke. She prefers not to get imaging at this time, as prefers no interventions.

## 2014-10-04 NOTE — Assessment & Plan Note (Signed)
Recent ECHO shows severe LV dysfunction EF 20%. Reviewed notes from cardiology. Will continue Metoprolol.

## 2014-10-04 NOTE — Assessment & Plan Note (Signed)
Recent worsening of anxiety. Will increase Sertraline to 150mg  daily. Recheck in 4-6 weeks and prn.

## 2014-10-04 NOTE — Patient Instructions (Signed)
Increase Sertraline to 150mg  daily.  Follow up in 4-6 weeks or sooner as needed.

## 2014-10-04 NOTE — Assessment & Plan Note (Signed)
Continues on PD. Reviewed labs today.

## 2014-10-04 NOTE — Assessment & Plan Note (Signed)
BP Readings from Last 3 Encounters:  10/04/14 137/68  07/03/14 116/65  06/22/14 124/62   BP well controlled on Metoprolol.

## 2014-10-05 DIAGNOSIS — Z23 Encounter for immunization: Secondary | ICD-10-CM | POA: Diagnosis not present

## 2014-10-05 DIAGNOSIS — Z992 Dependence on renal dialysis: Secondary | ICD-10-CM | POA: Diagnosis not present

## 2014-10-05 DIAGNOSIS — D509 Iron deficiency anemia, unspecified: Secondary | ICD-10-CM | POA: Diagnosis not present

## 2014-10-05 DIAGNOSIS — N186 End stage renal disease: Secondary | ICD-10-CM | POA: Diagnosis not present

## 2014-10-06 DIAGNOSIS — Z992 Dependence on renal dialysis: Secondary | ICD-10-CM | POA: Diagnosis not present

## 2014-10-06 DIAGNOSIS — N186 End stage renal disease: Secondary | ICD-10-CM | POA: Diagnosis not present

## 2014-10-06 DIAGNOSIS — D509 Iron deficiency anemia, unspecified: Secondary | ICD-10-CM | POA: Diagnosis not present

## 2014-10-06 DIAGNOSIS — Z23 Encounter for immunization: Secondary | ICD-10-CM | POA: Diagnosis not present

## 2014-10-08 DIAGNOSIS — D509 Iron deficiency anemia, unspecified: Secondary | ICD-10-CM | POA: Diagnosis not present

## 2014-10-08 DIAGNOSIS — Z992 Dependence on renal dialysis: Secondary | ICD-10-CM | POA: Diagnosis not present

## 2014-10-08 DIAGNOSIS — Z23 Encounter for immunization: Secondary | ICD-10-CM | POA: Diagnosis not present

## 2014-10-08 DIAGNOSIS — N186 End stage renal disease: Secondary | ICD-10-CM | POA: Diagnosis not present

## 2014-10-09 DIAGNOSIS — Z992 Dependence on renal dialysis: Secondary | ICD-10-CM | POA: Diagnosis not present

## 2014-10-09 DIAGNOSIS — Z23 Encounter for immunization: Secondary | ICD-10-CM | POA: Diagnosis not present

## 2014-10-09 DIAGNOSIS — N186 End stage renal disease: Secondary | ICD-10-CM | POA: Diagnosis not present

## 2014-10-09 DIAGNOSIS — D509 Iron deficiency anemia, unspecified: Secondary | ICD-10-CM | POA: Diagnosis not present

## 2014-10-11 DIAGNOSIS — D509 Iron deficiency anemia, unspecified: Secondary | ICD-10-CM | POA: Diagnosis not present

## 2014-10-11 DIAGNOSIS — Z992 Dependence on renal dialysis: Secondary | ICD-10-CM | POA: Diagnosis not present

## 2014-10-11 DIAGNOSIS — Z23 Encounter for immunization: Secondary | ICD-10-CM | POA: Diagnosis not present

## 2014-10-11 DIAGNOSIS — N186 End stage renal disease: Secondary | ICD-10-CM | POA: Diagnosis not present

## 2014-10-12 DIAGNOSIS — N186 End stage renal disease: Secondary | ICD-10-CM | POA: Diagnosis not present

## 2014-10-12 DIAGNOSIS — D509 Iron deficiency anemia, unspecified: Secondary | ICD-10-CM | POA: Diagnosis not present

## 2014-10-12 DIAGNOSIS — Z992 Dependence on renal dialysis: Secondary | ICD-10-CM | POA: Diagnosis not present

## 2014-10-12 DIAGNOSIS — Z23 Encounter for immunization: Secondary | ICD-10-CM | POA: Diagnosis not present

## 2014-10-13 DIAGNOSIS — Z23 Encounter for immunization: Secondary | ICD-10-CM | POA: Diagnosis not present

## 2014-10-13 DIAGNOSIS — Z992 Dependence on renal dialysis: Secondary | ICD-10-CM | POA: Diagnosis not present

## 2014-10-13 DIAGNOSIS — D509 Iron deficiency anemia, unspecified: Secondary | ICD-10-CM | POA: Diagnosis not present

## 2014-10-13 DIAGNOSIS — N186 End stage renal disease: Secondary | ICD-10-CM | POA: Diagnosis not present

## 2014-10-15 DIAGNOSIS — Z992 Dependence on renal dialysis: Secondary | ICD-10-CM | POA: Diagnosis not present

## 2014-10-15 DIAGNOSIS — D509 Iron deficiency anemia, unspecified: Secondary | ICD-10-CM | POA: Diagnosis not present

## 2014-10-15 DIAGNOSIS — N186 End stage renal disease: Secondary | ICD-10-CM | POA: Diagnosis not present

## 2014-10-15 DIAGNOSIS — Z23 Encounter for immunization: Secondary | ICD-10-CM | POA: Diagnosis not present

## 2014-10-16 DIAGNOSIS — Z23 Encounter for immunization: Secondary | ICD-10-CM | POA: Diagnosis not present

## 2014-10-16 DIAGNOSIS — Z992 Dependence on renal dialysis: Secondary | ICD-10-CM | POA: Diagnosis not present

## 2014-10-16 DIAGNOSIS — D509 Iron deficiency anemia, unspecified: Secondary | ICD-10-CM | POA: Diagnosis not present

## 2014-10-16 DIAGNOSIS — N186 End stage renal disease: Secondary | ICD-10-CM | POA: Diagnosis not present

## 2014-10-18 DIAGNOSIS — Z992 Dependence on renal dialysis: Secondary | ICD-10-CM | POA: Diagnosis not present

## 2014-10-18 DIAGNOSIS — D509 Iron deficiency anemia, unspecified: Secondary | ICD-10-CM | POA: Diagnosis not present

## 2014-10-18 DIAGNOSIS — N186 End stage renal disease: Secondary | ICD-10-CM | POA: Diagnosis not present

## 2014-10-18 DIAGNOSIS — Z23 Encounter for immunization: Secondary | ICD-10-CM | POA: Diagnosis not present

## 2014-10-19 DIAGNOSIS — N186 End stage renal disease: Secondary | ICD-10-CM | POA: Diagnosis not present

## 2014-10-19 DIAGNOSIS — D509 Iron deficiency anemia, unspecified: Secondary | ICD-10-CM | POA: Diagnosis not present

## 2014-10-19 DIAGNOSIS — Z992 Dependence on renal dialysis: Secondary | ICD-10-CM | POA: Diagnosis not present

## 2014-10-19 DIAGNOSIS — Z23 Encounter for immunization: Secondary | ICD-10-CM | POA: Diagnosis not present

## 2014-10-20 DIAGNOSIS — Z23 Encounter for immunization: Secondary | ICD-10-CM | POA: Diagnosis not present

## 2014-10-20 DIAGNOSIS — D509 Iron deficiency anemia, unspecified: Secondary | ICD-10-CM | POA: Diagnosis not present

## 2014-10-20 DIAGNOSIS — N186 End stage renal disease: Secondary | ICD-10-CM | POA: Diagnosis not present

## 2014-10-20 DIAGNOSIS — Z992 Dependence on renal dialysis: Secondary | ICD-10-CM | POA: Diagnosis not present

## 2014-10-22 DIAGNOSIS — N186 End stage renal disease: Secondary | ICD-10-CM | POA: Diagnosis not present

## 2014-10-22 DIAGNOSIS — D509 Iron deficiency anemia, unspecified: Secondary | ICD-10-CM | POA: Diagnosis not present

## 2014-10-22 DIAGNOSIS — Z992 Dependence on renal dialysis: Secondary | ICD-10-CM | POA: Diagnosis not present

## 2014-10-22 DIAGNOSIS — Z23 Encounter for immunization: Secondary | ICD-10-CM | POA: Diagnosis not present

## 2014-10-23 DIAGNOSIS — D509 Iron deficiency anemia, unspecified: Secondary | ICD-10-CM | POA: Diagnosis not present

## 2014-10-23 DIAGNOSIS — Z992 Dependence on renal dialysis: Secondary | ICD-10-CM | POA: Diagnosis not present

## 2014-10-23 DIAGNOSIS — Z23 Encounter for immunization: Secondary | ICD-10-CM | POA: Diagnosis not present

## 2014-10-23 DIAGNOSIS — N186 End stage renal disease: Secondary | ICD-10-CM | POA: Diagnosis not present

## 2014-10-25 DIAGNOSIS — Z992 Dependence on renal dialysis: Secondary | ICD-10-CM | POA: Diagnosis not present

## 2014-10-25 DIAGNOSIS — D509 Iron deficiency anemia, unspecified: Secondary | ICD-10-CM | POA: Diagnosis not present

## 2014-10-25 DIAGNOSIS — Z23 Encounter for immunization: Secondary | ICD-10-CM | POA: Diagnosis not present

## 2014-10-25 DIAGNOSIS — N186 End stage renal disease: Secondary | ICD-10-CM | POA: Diagnosis not present

## 2014-10-26 DIAGNOSIS — Z992 Dependence on renal dialysis: Secondary | ICD-10-CM | POA: Diagnosis not present

## 2014-10-26 DIAGNOSIS — N186 End stage renal disease: Secondary | ICD-10-CM | POA: Diagnosis not present

## 2014-10-26 DIAGNOSIS — D509 Iron deficiency anemia, unspecified: Secondary | ICD-10-CM | POA: Diagnosis not present

## 2014-10-26 DIAGNOSIS — Z23 Encounter for immunization: Secondary | ICD-10-CM | POA: Diagnosis not present

## 2014-10-27 DIAGNOSIS — Z992 Dependence on renal dialysis: Secondary | ICD-10-CM | POA: Diagnosis not present

## 2014-10-27 DIAGNOSIS — E559 Vitamin D deficiency, unspecified: Secondary | ICD-10-CM | POA: Diagnosis not present

## 2014-10-27 DIAGNOSIS — D509 Iron deficiency anemia, unspecified: Secondary | ICD-10-CM | POA: Diagnosis not present

## 2014-10-27 DIAGNOSIS — N2581 Secondary hyperparathyroidism of renal origin: Secondary | ICD-10-CM | POA: Diagnosis not present

## 2014-10-27 DIAGNOSIS — N186 End stage renal disease: Secondary | ICD-10-CM | POA: Diagnosis not present

## 2014-11-01 ENCOUNTER — Ambulatory Visit (INDEPENDENT_AMBULATORY_CARE_PROVIDER_SITE_OTHER): Payer: Medicare Other | Admitting: Internal Medicine

## 2014-11-01 ENCOUNTER — Encounter: Payer: Self-pay | Admitting: Internal Medicine

## 2014-11-01 VITALS — BP 116/69 | HR 76 | Temp 97.8°F | Ht 65.0 in | Wt 126.4 lb

## 2014-11-01 DIAGNOSIS — F329 Major depressive disorder, single episode, unspecified: Secondary | ICD-10-CM | POA: Diagnosis not present

## 2014-11-01 DIAGNOSIS — I10 Essential (primary) hypertension: Secondary | ICD-10-CM | POA: Diagnosis not present

## 2014-11-01 DIAGNOSIS — F32A Depression, unspecified: Secondary | ICD-10-CM

## 2014-11-01 MED ORDER — ARIPIPRAZOLE 2 MG PO TABS: 2.0000 mg | ORAL_TABLET | Freq: Every day | ORAL | Status: DC

## 2014-11-01 NOTE — Progress Notes (Signed)
Pre visit review using our clinic review tool, if applicable. No additional management support is needed unless otherwise documented below in the visit note. 

## 2014-11-01 NOTE — Patient Instructions (Addendum)
Continue Sertraline.  Start Abilify 2mg  daily to help with depression.  We will also set up an evaluation with psychiatry.  Follow up in 2 weeks.  Aripiprazole tablets What is this medicine? ARIPIPRAZOLE (ay ri PIP ray zole) is an atypical antipsychotic. It is used to treat schizophrenia and bipolar disorder, also known as manic-depression. It is also used to treat Tourette's disorder and some symptoms of autism. This medicine may also be used in combination with antidepressants to treat major depressive disorder. This medicine may be used for other purposes; ask your health care provider or pharmacist if you have questions. What should I tell my health care provider before I take this medicine? They need to know if you have any of these conditions: -dehydration -dementia -diabetes -heart disease -history of stroke -low blood counts, like low white cell, platelet, or red cell counts -Parkinson's disease -seizures -suicidal thoughts, plans, or attempt; a previous suicide attempt by you or a family member -an unusual or allergic reaction to aripiprazole, other medicines, foods, dyes, or preservatives -pregnant or trying to get pregnant -breast-feeding How should I use this medicine? Take this medicine by mouth with a glass of water. Follow the directions on the prescription label. You can take this medicine with or without food. Take your doses at regular intervals. Do not take your medicine more often than directed. Do not stop taking except on the advice of your doctor or health care professional. A special MedGuide will be given to you by the pharmacist with each prescription and refill. Be sure to read this information carefully each time. Talk to your pediatrician regarding the use of this medicine in children. While this drug may be prescribed for children as young as 83 years of age for selected conditions, precautions do apply. Overdosage: If you think you have taken too much of  this medicine contact a poison control center or emergency room at once. NOTE: This medicine is only for you. Do not share this medicine with others. What if I miss a dose? If you miss a dose, take it as soon as you can. If it is almost time for your next dose, take only that dose. Do not take double or extra doses. What may interact with this medicine? Do not take this medicine with any of the following medications: -certain medicines for fungal infections like fluconazole, itraconazole, ketoconazole, posaconazole, voriconazole -cisapride -dofetilide -dronedarone -pimozide -thioridazine -ziprasidone This medicine may also interact with the following medications: -carbamazepine -certain medicines for blood pressure -erythromycin -fluoxetine -grapefruit juice -other medicines that prolong the QT interval (cause an abnormal heart rhythm) -paroxetine -quinidine -valproic acid This list may not describe all possible interactions. Give your health care provider a list of all the medicines, herbs, non-prescription drugs, or dietary supplements you use. Also tell them if you smoke, drink alcohol, or use illegal drugs. Some items may interact with your medicine. What should I watch for while using this medicine? Visit your doctor or health care professional for regular checks on your progress. It may be several weeks before you see the full effects of this medicine. Do not suddenly stop taking this medicine. You may need to gradually reduce the dose. Patients and their families should watch out for worsening depression or thoughts of suicide. Also watch out for sudden changes in feelings such as feeling anxious, agitated, panicky, irritable, hostile, aggressive, impulsive, severely restless, overly excited and hyperactive, or not being able to sleep. If this happens, especially at the beginning of antidepressant  treatment or after a change in dose, call your health care professional. Dennis Bast may get  dizzy or drowsy. Do not drive, use machinery, or do anything that needs mental alertness until you know how this medicine affects you. Do not stand or sit up quickly, especially if you are an older patient. This reduces the risk of dizzy or fainting spells. Alcohol can increase dizziness and drowsiness. Avoid alcoholic drinks. This medicine can reduce the response of your body to heat or cold. Dress warm in cold weather and stay hydrated in hot weather. If possible, avoid extreme temperatures like saunas, hot tubs, very hot or cold showers, or activities that can cause dehydration such as vigorous exercise. This medicine may cause dry eyes and blurred vision. If you wear contact lenses you may feel some discomfort. Lubricating drops may help. See your eye doctor if the problem does not go away or is severe. If you notice an increased hunger or thirst, different from your normal hunger or thirst, or if you find that you have to urinate more frequently, you should contact your health care provider as soon as possible. You may need to have your blood sugar monitored. This medicine may cause changes in your blood sugar levels. You should monitor you blood sugar frequently if you have diabetes. There have been reports of uncontrollable and strong urges to gamble, binge eat, shop, and have sex while taking this medicine. If you experience any of these or other uncontrollable and strong urges while taking this medicine, you should report it to your health care provider as soon as possible. What side effects may I notice from receiving this medicine? Side effects that you should report to your doctor or health care professional as soon as possible: -allergic reactions like skin rash, itching or hives, swelling of the face, lips, or tongue -breathing problems -confusion -feeling faint or lightheaded, falls -fever or chills, sore throat -increased hunger or thirst -increased urination -joint pain -muscles pain,  spasms -problems with balance, talking, walking -restlessness or need to keep moving -seizures -suicidal thoughts or other mood changes -trouble swallowing -uncontrollable and excessive urges (examples: gambling, binge eating, shopping, having sex) -uncontrollable head, mouth, neck, arm, or leg movements -unusually weak or tired Side effects that usually do not require medical attention (report to your doctor or health care professional if they continue or are bothersome): -blurred vision -constipation -headache -nausea, vomiting -trouble sleeping -weight gain This list may not describe all possible side effects. Call your doctor for medical advice about side effects. You may report side effects to FDA at 1-800-FDA-1088. Where should I keep my medicine? Keep out of the reach of children. Store at room temperature between 15 and 30 degrees C (59 and 86 degrees F). Throw away any unused medicine after the expiration date. NOTE: This sheet is a summary. It may not cover all possible information. If you have questions about this medicine, talk to your doctor, pharmacist, or health care provider.    2016, [employer]vier/Gold Standard. (2014-05-31 15:37:42)

## 2014-11-01 NOTE — Progress Notes (Signed)
Subjective:    Patient ID: Sharon [name], female    DOB: Apr 05, 1926, 81 y.o.   MRN: [id number]  HPI  81YO female presents for follow up.  Last visit, increased Sertraline to 150mg  daily. No improvement with this. Feeling more depressed. Sleeping all the time. Getting progressively worse. Notes apathy. Feels "void of emotion." No suicidal ideation. Treated in past for severe depression. Never hospitalized.   Wt Readings from Last 3 Encounters:  11/01/14 126 lb 6 oz (57.323 kg)  10/04/14 125 lb 4 oz (56.813 kg)  07/03/14 123 lb (55.792 kg)   BP Readings from Last 3 Encounters:  11/01/14 116/69  10/04/14 137/68  07/03/14 116/65    Past Medical History  Diagnosis Date  . Peritoneal dialysis status ([place])   . Meniere disease   . Meniere's disease   . Kidney failure July 2012  . Kidney failure    Family History  Problem Relation Age of Onset  . Cancer Mother     ? ovarian - sarcoma  . Cancer Father     Skin cancer  . Diabetes Sister   . COPD Sister   . Depression Sister   . Cancer Sister     Lung - 46 yrs old   Past Surgical History  Procedure Laterality Date  . Pacemaker insertion  2006  . Cholecystectomy  01/2008  . Total knee arthroplasty  2008    LEFT/Dr Calif  . Cataract extraction  2006, 2011  . Insertion of dialysis catheter  07/2010  . Abdominal adhesion surgery  1958   Social History   Social History  . Marital Status: Married    Spouse Name: N/A  . Number of Children: 0  . Years of Education: N/A   Occupational History  . Retired     Social History Main Topics  . Smoking status: Former Smoker    Quit date: 03/31/1948  . Smokeless tobacco: Never Used  . Alcohol Use: No  . Drug Use: No  . Sexual Activity: Not Asked   Other Topics Concern  . None   Social History Narrative   Lives in [place] alone. Widow 2012.      Regular Exercise -  Housework   Daily Caffeine Use:  1 - 2 cups coffee             Review of Systems    Constitutional: Positive for fatigue. Negative for fever, chills, appetite change and unexpected weight change.  Eyes: Negative for visual disturbance.  Respiratory: Negative for shortness of breath.   Cardiovascular: Negative for chest pain and leg swelling.  Gastrointestinal: Negative for nausea, vomiting, abdominal pain, diarrhea and constipation.  Skin: Negative for color change and rash.  Hematological: Negative for adenopathy. Does not bruise/bleed easily.  Psychiatric/Behavioral: Positive for sleep disturbance, dysphoric mood and decreased concentration. The patient is not nervous/anxious.        Objective:    BP 116/69 mmHg  Pulse 76  Temp(Src) 97.8 F (36.6 C) (Oral)  Ht 5\' 5"  (1.651 m)  Wt 126 lb 6 oz (57.323 kg)  BMI 21.03 kg/m2  SpO2 98% Physical Exam  Constitutional: She is oriented to person, place, and time. She appears well-developed and well-nourished. No distress.  HENT:  Head: Normocephalic and atraumatic.  Right Ear: External ear normal.  Left Ear: External ear normal.  Nose: Nose normal.  Mouth/Throat: Oropharynx is clear and moist. No oropharyngeal exudate.  Eyes: Conjunctivae are normal. Pupils are equal, round, and reactive to light. Right  eye exhibits no discharge. Left eye exhibits no discharge. No scleral icterus.  Neck: Normal range of motion. Neck supple. No tracheal deviation present. No thyromegaly present.  Cardiovascular: Normal rate, regular rhythm, normal heart sounds and intact distal pulses.  Exam reveals no gallop and no friction rub.   No murmur heard. Pulmonary/Chest: Effort normal and breath sounds normal. No respiratory distress. She has no wheezes. She has no rales. She exhibits no tenderness.  Musculoskeletal: Normal range of motion. She exhibits no edema or tenderness.  Lymphadenopathy:    She has no cervical adenopathy.  Neurological: She is alert and oriented to person, place, and time. No cranial nerve deficit. She exhibits normal  muscle tone. Coordination normal.  Skin: Skin is warm and dry. No rash noted. She is not diaphoretic. No erythema. No pallor.  Psychiatric: Her speech is normal and behavior is normal. Judgment and thought content normal. Cognition and memory are normal. She exhibits a depressed mood. She expresses no suicidal ideation.          Assessment & Plan:   Problem List Items Addressed This Visit      Unprioritized   Depression - Primary    Worsening depression. No suicidal ideation. No improvement with Sertraline. Will add Abilify. Discussed potential risks of this medication. Will also place referral to psychiatry. Follow up in 2 weeks and prn.      Relevant Orders   Ambulatory referral to Psychiatry   Hypertension       Return in about 2 weeks (around 11/15/2014) for Recheck.

## 2014-11-01 NOTE — Assessment & Plan Note (Addendum)
Worsening depression. No suicidal ideation. No improvement with Sertraline. Will add Abilify. Discussed potential risks of this medication. Will also place referral to psychiatry. Follow up in 2 weeks and prn.

## 2014-11-08 DIAGNOSIS — I495 Sick sinus syndrome: Secondary | ICD-10-CM | POA: Diagnosis not present

## 2014-11-14 ENCOUNTER — Other Ambulatory Visit: Payer: Self-pay | Admitting: Internal Medicine

## 2014-11-15 ENCOUNTER — Encounter: Payer: Self-pay | Admitting: Internal Medicine

## 2014-11-15 ENCOUNTER — Ambulatory Visit
Admission: RE | Admit: 2014-11-15 | Discharge: 2014-11-15 | Disposition: A | Discharge status: Home / Self Care | Payer: Medicare Other | Source: Ambulatory Visit | Attending: Internal Medicine | Admitting: Internal Medicine

## 2014-11-15 ENCOUNTER — Ambulatory Visit (INDEPENDENT_AMBULATORY_CARE_PROVIDER_SITE_OTHER): Payer: Medicare Other | Admitting: Internal Medicine

## 2014-11-15 VITALS — BP 135/69 | HR 65 | Temp 98.1°F | Ht 65.0 in | Wt 128.4 lb

## 2014-11-15 DIAGNOSIS — Y92009 Unspecified place in unspecified non-institutional (private) residence as the place of occurrence of the external cause: Secondary | ICD-10-CM

## 2014-11-15 DIAGNOSIS — R918 Other nonspecific abnormal finding of lung field: Secondary | ICD-10-CM | POA: Diagnosis not present

## 2014-11-15 DIAGNOSIS — W06XXXA Fall from bed, initial encounter: Secondary | ICD-10-CM | POA: Insufficient documentation

## 2014-11-15 DIAGNOSIS — R079 Chest pain, unspecified: Secondary | ICD-10-CM | POA: Insufficient documentation

## 2014-11-15 DIAGNOSIS — I495 Sick sinus syndrome: Secondary | ICD-10-CM | POA: Diagnosis not present

## 2014-11-15 DIAGNOSIS — W19XXXA Unspecified fall, initial encounter: Secondary | ICD-10-CM

## 2014-11-15 DIAGNOSIS — F329 Major depressive disorder, single episode, unspecified: Secondary | ICD-10-CM | POA: Diagnosis not present

## 2014-11-15 DIAGNOSIS — Y92099 Unspecified place in other non-institutional residence as the place of occurrence of the external cause: Secondary | ICD-10-CM | POA: Diagnosis not present

## 2014-11-15 DIAGNOSIS — J90 Pleural effusion, not elsewhere classified: Secondary | ICD-10-CM | POA: Insufficient documentation

## 2014-11-15 DIAGNOSIS — F32A Depression, unspecified: Secondary | ICD-10-CM

## 2014-11-15 NOTE — Assessment & Plan Note (Signed)
Fall at home this week while leaning forward to pick up glasses. Exam most consistent with muscular strain causing pain. However, she is very concerned about rib fracture. Will get CXR. Continue prn Tramadol.

## 2014-11-15 NOTE — Patient Instructions (Addendum)
Continue current medications.  Chest xray today at [facility]. Continue Tramadol as needed for pain.  Follow up in 4 weeks.

## 2014-11-15 NOTE — Assessment & Plan Note (Signed)
HR well controlled on Metoprolol with Pacemaker. Continue to follow up with Dr. Nehemiah Massed.

## 2014-11-15 NOTE — Progress Notes (Signed)
Subjective:    Patient ID: Sharon [name], female    DOB: Feb 25, 1926, 81 y.o.   MRN: [id number]  HPI  81YO female presents for follow up.  Last visit 10/6, started on Abilify to help with depression. Referral also placed to psychiatry.  Depression - Started Abilify. Symptoms much improved. "Like night and day." No side effects noted on medication. More energy to participate in activities.  Fell onto floor when picking up glasses off floor Tuesday morning. Called EMS. She was examined and did not go to ER. Also seen by nephrologist yesterday. Has some aching pain in neck and shoulders. Pain improved some today. Took some Tramadol for pain.  Wt Readings from Last 3 Encounters:  11/15/14 128 lb 6 oz (58.231 kg)  11/01/14 126 lb 6 oz (57.323 kg)  10/04/14 125 lb 4 oz (56.813 kg)   BP Readings from Last 3 Encounters:  11/15/14 135/69  11/01/14 116/69  10/04/14 137/68    Past Medical History  Diagnosis Date  . Peritoneal dialysis status ([place])   . Meniere disease   . Meniere's disease   . Kidney failure July 2012  . Kidney failure    Family History  Problem Relation Age of Onset  . Cancer Mother     ? ovarian - sarcoma  . Cancer Father     Skin cancer  . Diabetes Sister   . COPD Sister   . Depression Sister   . Cancer Sister     Lung - 14 yrs old   Past Surgical History  Procedure Laterality Date  . Pacemaker insertion  2006  . Cholecystectomy  01/2008  . Total knee arthroplasty  2008    LEFT/Dr Calif  . Cataract extraction  2006, 2011  . Insertion of dialysis catheter  07/2010  . Abdominal adhesion surgery  1958   Social History   Social History  . Marital Status: Married    Spouse Name: N/A  . Number of Children: 0  . Years of Education: N/A   Occupational History  . Retired     Social History Main Topics  . Smoking status: Former Smoker    Quit date: 03/31/1948  . Smokeless tobacco: Never Used  . Alcohol Use: No  . Drug Use: No  . Sexual Activity:  Not Asked   Other Topics Concern  . None   Social History Narrative   Lives in [place] alone. Widow 2012.      Regular Exercise -  Housework   Daily Caffeine Use:  1 - 2 cups coffee             Review of Systems  Constitutional: Negative for fever, chills, appetite change, fatigue and unexpected weight change.  Eyes: Negative for visual disturbance.  Respiratory: Negative for shortness of breath.   Cardiovascular: Negative for chest pain and leg swelling.  Gastrointestinal: Negative for abdominal pain.  Musculoskeletal: Positive for myalgias and arthralgias.  Skin: Positive for color change. Negative for rash.  Hematological: Negative for adenopathy. Does not bruise/bleed easily.  Psychiatric/Behavioral: Negative for dysphoric mood. The patient is not nervous/anxious.        Objective:    BP 135/69 mmHg  Pulse 65  Temp(Src) 98.1 F (36.7 C) (Oral)  Ht 5\' 5"  (1.651 m)  Wt 128 lb 6 oz (58.231 kg)  BMI 21.36 kg/m2  SpO2 96% Physical Exam  Constitutional: She is oriented to person, place, and time. She appears well-developed and well-nourished. No distress.  HENT:  Head: Normocephalic  and atraumatic.  Right Ear: External ear normal.  Left Ear: External ear normal.  Nose: Nose normal.  Mouth/Throat: Oropharynx is clear and moist. No oropharyngeal exudate.  Eyes: Conjunctivae are normal. Pupils are equal, round, and reactive to light. Right eye exhibits no discharge. Left eye exhibits no discharge. No scleral icterus.  Neck: Normal range of motion. Neck supple. No tracheal deviation present. No thyromegaly present.  Cardiovascular: Normal rate, regular rhythm, normal heart sounds and intact distal pulses.  Exam reveals no gallop and no friction rub.   No murmur heard. Pulmonary/Chest: Effort normal and breath sounds normal. No accessory muscle usage. No tachypnea. No respiratory distress. She has no decreased breath sounds. She has no wheezes. She has no rales. She  exhibits tenderness.  Diffuse chest wall tenderness and tenderness over trapezius bilaterally  Musculoskeletal: Normal range of motion. She exhibits no edema or tenderness.  Lymphadenopathy:    She has no cervical adenopathy.  Neurological: She is alert and oriented to person, place, and time. No cranial nerve deficit. She exhibits normal muscle tone. Coordination normal.  Skin: Skin is warm and dry. No rash noted. She is not diaphoretic. No erythema. No pallor.     Psychiatric: She has a normal mood and affect. Her behavior is normal. Judgment and thought content normal.          Assessment & Plan:   Problem List Items Addressed This Visit      Unprioritized   Depression - Primary    Symptoms much improved. Continue Sertraline and Abilify. Follow up 3 months and prn.      Fall at home    Fall at home this week while leaning forward to pick up glasses. Exam most consistent with muscular strain causing pain. However, she is very concerned about rib fracture. Will get CXR. Continue prn Tramadol.      Relevant Orders   DG Chest 2 View   Sinus node dysfunction (HCC)    HR well controlled on Metoprolol with Pacemaker. Continue to follow up with Dr. Nehemiah Massed.          Return in about 4 weeks (around 12/13/2014) for Recheck.

## 2014-11-15 NOTE — Assessment & Plan Note (Signed)
Symptoms much improved. Continue Sertraline and Abilify. Follow up 3 months and prn.

## 2014-11-15 NOTE — Progress Notes (Signed)
Pre visit review using our clinic review tool, if applicable. No additional management support is needed unless otherwise documented below in the visit note. 

## 2014-11-21 ENCOUNTER — Ambulatory Visit (INDEPENDENT_AMBULATORY_CARE_PROVIDER_SITE_OTHER): Payer: Medicare Other | Admitting: Family Medicine

## 2014-11-21 ENCOUNTER — Encounter: Payer: Self-pay | Admitting: Family Medicine

## 2014-11-21 VITALS — BP 120/64 | HR 81 | Temp 98.0°F | Ht 65.0 in | Wt 127.6 lb

## 2014-11-21 DIAGNOSIS — R208 Other disturbances of skin sensation: Secondary | ICD-10-CM | POA: Diagnosis not present

## 2014-11-21 DIAGNOSIS — R51 Headache: Secondary | ICD-10-CM | POA: Diagnosis not present

## 2014-11-21 DIAGNOSIS — R519 Headache, unspecified: Secondary | ICD-10-CM

## 2014-11-21 LAB — VITAMIN B12: Vitamin B-12: 617 pg/mL (ref 211–911)

## 2014-11-21 NOTE — Patient Instructions (Addendum)
Nice to meet you. We will obtain a CT scan of your head to evaluate this issue further. We will also obtain a vitamin B12 test today. Please decrease her Abilify dose to 1 mg per day. You should do this for the next week then stop the medication.  If you developed the following symptoms please let us know: Decreased appetite, anxiety, sweating, diarrhea, dizziness, headache, myalgia, nausea, tingling, restlessness, tremulousness and vomiting. If you develop worsening headache, numbness, weakness, vision changes, imbalance, or fever please seek medical attention.

## 2014-11-21 NOTE — Progress Notes (Signed)
Pre visit review using our clinic review tool, if applicable. No additional management support is needed unless otherwise documented below in the visit note. 

## 2014-11-22 ENCOUNTER — Encounter: Payer: Self-pay | Admitting: Family Medicine

## 2014-11-22 DIAGNOSIS — R208 Other disturbances of skin sensation: Secondary | ICD-10-CM | POA: Insufficient documentation

## 2014-11-22 MED ORDER — ARIPIPRAZOLE 2 MG PO TABS: 1.0000 mg | ORAL_TABLET | Freq: Every day | ORAL | Status: DC

## 2014-11-22 NOTE — Progress Notes (Signed)
Patient ID: Sharon [name], female   DOB: 1927/01/19, 81 y.o.   MRN: [id number]  Sharon Rumps, MD Phone: [phone]  Sharon [name] is a 80 y.o. female who presents today for same-day visit.  Patient presents today with complaint of "flashes" that she describes as occurring in the back right side of her head. She notes this occurs only when she is extremely tired. The best way she is able to describe this is a "short circuit in the brain." On further questioning this is described as an electrical shock sensation in the back right area of her scalp. This only occurs on the right posterior aspect of her head. She states it lasts a few seconds. She can have 3 or 4 shooting electric type shocks in the back of her head in a row. They do not occur every day, though they're starting to happen most days. She says she's had these for at least 40 years and they always increase when she has medication changes. She was started on Abilify within the last month and had her Zoloft dose increased as well in the last month. She has no vision issues, weakness, numbness, or other types of headaches. She does note balance issues that have been chronic over a number of years and have not worsened. She notes she feels well this time. She does note she had some chills yesterday though her temperature was only 99.1 Fahrenheit and she has not had recurrence of the chills. She does note she was seen for a fall last week by her PCP and had x-ray imaging done. She denies hitting her head at that time. She notes these flashing sensations have not worsened since the fall.  PMH: Former smoker.   ROS see history of present illness  Objective  Physical Exam Filed Vitals:   11/21/14 1403  BP: 120/64  Pulse: 81  Temp: 98 F (36.7 C)    Physical Exam  Constitutional: She is well-developed, well-nourished, and in no distress.  HENT:  Head: Normocephalic and atraumatic.  Right Ear: External ear normal.  Left Ear:  External ear normal.  Mouth/Throat: Oropharynx is clear and moist.  Eyes: Conjunctivae are normal. Pupils are equal, round, and reactive to light.  Neck: Neck supple.  No midline neck tenderness, no muscular neck tenderness, no posterior scalp tenderness and no rash or skin changes of the scalp  Cardiovascular: Normal rate, regular rhythm and normal heart sounds.   Pulmonary/Chest: Effort normal and breath sounds normal. No respiratory distress. She has no wheezes. She has no rales.  Lymphadenopathy:    She has no cervical adenopathy.  Neurological: She is alert.  CN 2-12 intact, 5/5 strength in bilateral biceps, triceps, grip, quads, hamstrings, plantar and dorsiflexion, sensation to light touch intact in bilateral UE and LE, normal gait, 2+ patellar reflexes  Skin: She is not diaphoretic.     Assessment/Plan: Please see individual problem list.  Electrical shock sensation in right posterior head Patient notes decades of the symptoms that intermittently worsen. She notes they become more frequent recently since starting on Abilify. She feels that the Abilify is the cause of this worsening. She did have a fall recently, though did not hit her head or lose consciousness. Patient is neurologically intact today. Patient is well-appearing today. Vital signs are stable. She's afebrile. Given recent fall we will obtain a CT scan of her head to evaluate this further for chronic issues that could be leading to this. Do not suspect an acute intracranial  issue given she is neurologically intact and is 2 weeks out from the fall. After long discussion with the patient we will discontinue her Abilify as she feels this is contributing to the sensation. She'll do this by taking 1 mg daily for one week and then stopping the medication as she has only been on this a short period of time. We will also check a B12. She was given withdrawal symptoms to monitor for. She is also given return precautions.    Orders  Placed This Encounter  Procedures  . CT Head Wo Contrast    Standing Status: Future     Number of Occurrences:      Standing Expiration Date: 02/21/2016    Order Specific Question:  Reason for Exam (SYMPTOM  OR DIAGNOSIS REQUIRED)    Answer:  chronic headache worsened recently    Order Specific Question:  Preferred imaging location?    Answer:  [facility]  . B12    Sharon [name]

## 2014-11-22 NOTE — Assessment & Plan Note (Addendum)
Patient notes decades of the symptoms that intermittently worsen. She notes they become more frequent recently since starting on Abilify. She feels that the Abilify is the cause of this worsening. She did have a fall recently, though did not hit her head or lose consciousness. Patient is neurologically intact today. Patient is well-appearing today. Vital signs are stable. She's afebrile. Given recent fall we will obtain a CT scan of her head to evaluate this further for chronic issues that could be leading to this. Do not suspect an acute intracranial issue given she is neurologically intact and is 2 weeks out from the fall. After long discussion with the patient we will discontinue her Abilify as she feels this is contributing to the sensation. She'll do this by taking 1 mg daily for one week and then stopping the medication as she has only been on this a short period of time. We will also check a B12. She was given withdrawal symptoms to monitor for. She is also given return precautions.

## 2014-11-26 ENCOUNTER — Ambulatory Visit
Admission: RE | Admit: 2014-11-26 | Discharge: 2014-11-26 | Disposition: A | Discharge status: Home / Self Care | Payer: Medicare Other | Source: Ambulatory Visit | Attending: Family Medicine | Admitting: Family Medicine

## 2014-11-26 DIAGNOSIS — I679 Cerebrovascular disease, unspecified: Secondary | ICD-10-CM | POA: Insufficient documentation

## 2014-11-26 DIAGNOSIS — R51 Headache: Secondary | ICD-10-CM | POA: Diagnosis present

## 2014-11-26 DIAGNOSIS — G3189 Other specified degenerative diseases of nervous system: Secondary | ICD-10-CM | POA: Insufficient documentation

## 2014-11-26 DIAGNOSIS — R519 Headache, unspecified: Secondary | ICD-10-CM

## 2014-11-27 DIAGNOSIS — Z992 Dependence on renal dialysis: Secondary | ICD-10-CM | POA: Diagnosis not present

## 2014-11-27 DIAGNOSIS — N186 End stage renal disease: Secondary | ICD-10-CM | POA: Diagnosis not present

## 2014-11-27 DIAGNOSIS — D509 Iron deficiency anemia, unspecified: Secondary | ICD-10-CM | POA: Diagnosis not present

## 2014-11-28 ENCOUNTER — Emergency Department: Payer: Medicare Other

## 2014-11-28 ENCOUNTER — Emergency Department
Admission: EM | Admit: 2014-11-28 | Discharge: 2014-11-28 | Disposition: A | Discharge status: Home / Self Care | Payer: Medicare Other | Attending: Emergency Medicine | Admitting: Emergency Medicine

## 2014-11-28 ENCOUNTER — Encounter: Payer: Self-pay | Admitting: *Deleted

## 2014-11-28 ENCOUNTER — Telehealth: Payer: Self-pay | Admitting: Internal Medicine

## 2014-11-28 DIAGNOSIS — R079 Chest pain, unspecified: Secondary | ICD-10-CM | POA: Diagnosis present

## 2014-11-28 DIAGNOSIS — E119 Type 2 diabetes mellitus without complications: Secondary | ICD-10-CM | POA: Insufficient documentation

## 2014-11-28 DIAGNOSIS — Z992 Dependence on renal dialysis: Secondary | ICD-10-CM | POA: Insufficient documentation

## 2014-11-28 DIAGNOSIS — N185 Chronic kidney disease, stage 5: Secondary | ICD-10-CM | POA: Diagnosis not present

## 2014-11-28 DIAGNOSIS — I120 Hypertensive chronic kidney disease with stage 5 chronic kidney disease or end stage renal disease: Secondary | ICD-10-CM | POA: Diagnosis not present

## 2014-11-28 DIAGNOSIS — Z79899 Other long term (current) drug therapy: Secondary | ICD-10-CM | POA: Insufficient documentation

## 2014-11-28 DIAGNOSIS — Z87891 Personal history of nicotine dependence: Secondary | ICD-10-CM | POA: Diagnosis not present

## 2014-11-28 DIAGNOSIS — R0789 Other chest pain: Secondary | ICD-10-CM | POA: Diagnosis not present

## 2014-11-28 DIAGNOSIS — J90 Pleural effusion, not elsewhere classified: Secondary | ICD-10-CM | POA: Diagnosis not present

## 2014-11-28 LAB — COMPREHENSIVE METABOLIC PANEL
ALT: 30 U/L (ref 14–54)
AST: 35 U/L (ref 15–41)
Albumin: 3.6 g/dL (ref 3.5–5.0)
Alkaline Phosphatase: 105 U/L (ref 38–126)
Anion gap: 11 (ref 5–15)
BUN: 31 mg/dL — AB (ref 6–20)
CHLORIDE: 103 mmol/L (ref 101–111)
CO2: 26 mmol/L (ref 22–32)
CREATININE: 2.05 mg/dL — AB (ref 0.44–1.00)
Calcium: 9.2 mg/dL (ref 8.9–10.3)
GFR, EST AFRICAN AMERICAN: 24 mL/min — AB (ref >60)
GFR, EST NON AFRICAN AMERICAN: 21 mL/min — AB (ref >60)
Glucose, Bld: 110 mg/dL — ABNORMAL HIGH (ref 65–99)
Potassium: 3.9 mmol/L (ref 3.5–5.1)
Sodium: 140 mmol/L (ref 135–145)
TOTAL PROTEIN: 7.2 g/dL (ref 6.5–8.1)
Total Bilirubin: 0.9 mg/dL (ref 0.3–1.2)

## 2014-11-28 LAB — TROPONIN I
TROPONIN I: 0.06 ng/mL — AB (ref <0.031)
TROPONIN I: 0.06 ng/mL — AB (ref <0.031)

## 2014-11-28 LAB — CBC
HCT: 31.1 % — ABNORMAL LOW (ref 35.0–47.0)
Hemoglobin: 10.6 g/dL — ABNORMAL LOW (ref 12.0–16.0)
MCH: 30.8 pg (ref 26.0–34.0)
MCHC: 34.2 g/dL (ref 32.0–36.0)
MCV: 90.1 fL (ref 80.0–100.0)
PLATELETS: 348 10*3/uL (ref 150–440)
RBC: 3.45 MIL/uL — AB (ref 3.80–5.20)
RDW: 12.6 % (ref 11.5–14.5)
WBC: 8 10*3/uL (ref 3.6–11.0)

## 2014-11-28 MED ORDER — AZITHROMYCIN 250 MG PO TABS
500.0000 mg | ORAL_TABLET | Freq: Once | ORAL | Status: AC
  Administered 2014-11-28: 500 mg via ORAL
  Filled 2014-11-28: qty 2

## 2014-11-28 MED ORDER — AZITHROMYCIN 250 MG PO TABS: ORAL_TABLET | ORAL | Status: DC

## 2014-11-28 MED ORDER — TRAMADOL HCL 50 MG PO TABS: 50.0000 mg | ORAL_TABLET | Freq: Four times a day (QID) | ORAL | Status: DC | PRN

## 2014-11-28 MED ORDER — TRAMADOL HCL 50 MG PO TABS
100.0000 mg | ORAL_TABLET | Freq: Once | ORAL | Status: AC
  Administered 2014-11-28: 100 mg via ORAL
  Filled 2014-11-28: qty 2

## 2014-11-28 NOTE — ED Notes (Signed)
Pt to triage for chest pain. Pt s/p fall x 2,  2 weeks ago with shoulder pain and chest pain and was seen here 10/31. Pt returns today for increasing chest pain - "I can't stand it anymore"

## 2014-11-28 NOTE — ED Provider Notes (Signed)
Sign out was to follow-up with patient's second blood draw of her repeat troponin. Ongoing chest pain with cough times several weeks Physical Exam  BP 157/82 mmHg  Pulse 77  Temp(Src) 98.1 F (36.7 C) (Oral)  Resp 11  Ht 5\' 4"  (1.626 m)  Wt 125 lb (56.7 kg)  BMI 21.45 kg/m2  SpO2 98%  Physical Exam Patient is resting comfortable at this time. ED Course  Procedures  MDM Patient no acute distress resting comfortably at this time. No dialysis with a stable 0.06 troponin level. We'll discharge to home with antibiotics. Says will be able to follow-up with Dr. Gilford Rile, her primary care doctor in 1-2 days. Reviewed the labs also plan with the patient as well as her brother understanding and willing to comply.      Orbie Pyo, MD 11/28/14 [phone]

## 2014-11-28 NOTE — ED Provider Notes (Addendum)
[facility] Emergency Department Provider Note  Time seen: 2:07 PM  I have reviewed the triage vital signs and the nursing notes.   HISTORY  Chief Complaint Chest Pain    HPI Sharon [name] is a 81 y.o. female with a past medical history of Mnire's disease, CK D on peritoneal dialysis, who presents to the emergency department with chest pain. According to the patient she fell approximately 2 weeks ago landing on her right chest. Since that time she has had chest pain, worse with inspiration or coughing. States the chest pain has been ongoing, so she came to the emergency department for evaluation. Denies any acute worsening of chest pain. Patient states she has Ultram but has not been taking it. Denies any nausea, diaphoresis. Describes her chest pain as minimal at baseline, but moderate with coughing or deep inspiration.Denies any leg swelling or pain or history of DVT.    Past Medical History  Diagnosis Date  . Peritoneal dialysis status ([place])   . Meniere disease   . Meniere's disease   . Kidney failure July 2012  . Kidney failure     Patient Active Problem List   Diagnosis Date Noted  . Electrical shock sensation in right posterior head 11/22/2014  . Fall at home 11/15/2014  . Expressive aphasia 10/04/2014  . Chronic pain syndrome 05/22/2014  . Insomnia 03/13/2014  . Anxiety state 03/13/2014  . Basal cell carcinoma of neck 11/14/2013  . Pelvic pain in female 09/12/2013  . Neck pain of over 3 months duration 09/12/2013  . Memory loss 09/12/2013  . Systolic heart failure, chronic ([place]) 05/12/2013  . Medicare annual wellness visit, subsequent 11/10/2012  . Sinus node dysfunction ([place]) 08/01/2012  . Low back pain 08/01/2012  . Cervical spine pain 05/02/2012  . Irritable bowel syndrome 11/02/2011  . Depression 04/01/2011  . Hypertension 04/01/2011  . Menopausal disorder 04/01/2011  . Screening for breast cancer 04/01/2011  . Chronic kidney  disease, stage 5, kidney failure ([place]) 04/01/2011    Past Surgical History  Procedure Laterality Date  . Pacemaker insertion  2006  . Cholecystectomy  01/2008  . Total knee arthroplasty  2008    LEFT/Dr Calif  . Cataract extraction  2006, 2011  . Insertion of dialysis catheter  07/2010  . Abdominal adhesion surgery  1958    Current Outpatient Rx  Name  Route  Sig  Dispense  Refill  . ARIPiprazole (ABILIFY) 2 MG tablet   Oral   Take 0.5 tablets (1 mg total) by mouth daily. For 7 days then discontinue this medication.   30 tablet   0   . dicyclomine (BENTYL) 10 MG capsule   Oral   Take 1 capsule (10 mg total) by mouth 3 (three) times daily as needed for spasms.   90 capsule   0   . folic acid-vitamin b complex-vitamin c-selenium-zinc (DIALYVITE) 3 MG TABS tablet   Oral   Take 1 tablet by mouth daily.   30 tablet   5   . gentamicin ointment (GARAMYCIN) 0.1 %               . sertraline (ZOLOFT) 50 MG tablet   Oral   Take 3 tablets (150 mg total) by mouth daily.   90 tablet   6   . TOPROL XL 25 MG 24 hr tablet      TAKE 1/2 BY MOUTH DAILY   30 tablet   1   . traMADol (ULTRAM) 50  MG tablet   Oral   Take 0.5-1 tablets (25-50 mg total) by mouth 3 (three) times daily as needed.   60 tablet   1     Allergies Statins and Codeine  Family History  Problem Relation Age of Onset  . Cancer Mother     ? ovarian - sarcoma  . Cancer Father     Skin cancer  . Diabetes Sister   . COPD Sister   . Depression Sister   . Cancer Sister     Lung - 36 yrs old    Social History Social History  Substance Use Topics  . Smoking status: Former Smoker    Quit date: 03/31/1948  . Smokeless tobacco: Never Used  . Alcohol Use: No    Review of Systems Constitutional: Negative for fever. Cardiovascular: Positive for chest pain Respiratory: Negative for shortness of breath. Positive for cough. Gastrointestinal: Negative for abdominal pain Musculoskeletal: Negative for  back pain. Neurological: Negative for headache 10-point ROS otherwise negative.  ____________________________________________   PHYSICAL EXAM:  VITAL SIGNS: ED Triage Vitals  Enc Vitals Group     BP 11/28/14 1057 154/72 mmHg     Pulse Rate 11/28/14 1057 79     Resp 11/28/14 1057 18     Temp 11/28/14 1057 98.1 F (36.7 C)     Temp Source 11/28/14 1057 Oral     SpO2 11/28/14 1057 96 %     Weight 11/28/14 1057 125 lb (56.7 kg)     Height 11/28/14 1057 5\' 4"  (1.626 m)     Head Cir --      Peak Flow --      Pain Score 11/28/14 1058 10     Pain Loc --      Pain Edu? --      Excl. in [place]? --     Constitutional: Alert and oriented. Well appearing and in no distress. Eyes: Normal exam ENT   Head: Normocephalic and atraumatic.   Mouth/Throat: Mucous membranes are moist. Cardiovascular: Normal rate, regular rhythm. No murmur Respiratory: Normal respiratory effort without tachypnea nor retractions. Breath sounds are clear and equal bilaterally. No wheezes/rales/rhonchi. Significant midsternal tenderness to palpation. Gastrointestinal: Soft and nontender. No distention.  Musculoskeletal: Nontender with normal range of motion in all extremities. No lower extremity tenderness or edema. Neurologic:  Normal speech and language. No gross focal neurologic deficits Skin:  Skin is warm, dry and intact.  Psychiatric: Mood and affect are normal. Speech and behavior are normal.   ____________________________________________    EKG  EKG reviewed and interpreted by myself shows ventricular paced rhythm at 81 bpm.  ____________________________________________    RADIOLOGY  Chest x-ray shows small pleural effusion in the left base which is unchanged, otherwise no acute findings.  ____________________________________________    INITIAL IMPRESSION / ASSESSMENT AND PLAN / ED COURSE  Pertinent labs & imaging results that were available during my care of the patient were reviewed by  me and considered in my medical decision making (see chart for details).  Patient presents with chest pain, worse with cough or inspiration. Very tender to palpation. Likely musculoskeletal pain from her recent fall. States the chest pain started after her fall. I discussed with the patient given her frequent cough the child of antibiotics, incentive spirometry, and discussed the need to be taking Ultram 2-3 times daily until pain resolves. Patient is agreeable. Patient will follow up with her primary care physician for recheck. Patient's troponin is slightly elevated likely due to her  CK D, we'll repeat a troponin to help rule out ACS, although very low suspicion.  Currently awaiting repeat troponin. If negative patient will be discharged home with pain medication, incentive spirometry, and follow up with her primary care physician. ____________________________________________   FINAL CLINICAL IMPRESSION(S) / ED DIAGNOSES  Chest wall pain   Harvest Dark, MD 11/28/14 1517  Harvest Dark, MD 11/28/14 1531

## 2014-11-28 NOTE — Discharge Instructions (Signed)
You have been seen in the emergency department today for chest pain. Your workup has shown normal results, and your pain is likely due to chest wall pain since her fall. As we discussed please follow-up with your primary care physician in the next 1-2 days for recheck. Return to the emergency department for any further chest pain, trouble breathing, or any other symptom personally concerning to yourself. Please take your pain medication as needed, as prescribed. Please also use your incentive spirometer 2-3 times per hour while awake to help prevent pneumonia.   Chest Wall Pain Chest wall pain is pain in or around the bones and muscles of your chest. Sometimes, an injury causes this pain. Sometimes, the cause may not be known. This pain may take several weeks or longer to get better. HOME CARE INSTRUCTIONS  Pay attention to any changes in your symptoms. Take these actions to help with your pain:   Rest as told by your health care provider.   Avoid activities that cause pain. These include any activities that use your chest muscles or your abdominal and side muscles to lift heavy items.   If directed, apply ice to the painful area:  Put ice in a plastic bag.  Place a towel between your skin and the bag.  Leave the ice on for 20 minutes, 2-3 times per day.  Take over-the-counter and prescription medicines only as told by your health care provider.  Do not use tobacco products, including cigarettes, chewing tobacco, and e-cigarettes. If you need help quitting, ask your health care provider.  Keep all follow-up visits as told by your health care provider. This is important. SEEK MEDICAL CARE IF:  You have a fever.  Your chest pain becomes worse.  You have new symptoms. SEEK IMMEDIATE MEDICAL CARE IF:  You have nausea or vomiting.  You feel sweaty or light-headed.  You have a cough with phlegm (sputum) or you cough up blood.  You develop shortness of breath.   This information  is not intended to replace advice given to you by your health care provider. Make sure you discuss any questions you have with your health care provider.   Document Released: 01/12/2005 Document Revised: 10/03/2014 Document Reviewed: 04/09/2014 Elsevier Interactive Patient Education [employer].

## 2014-11-28 NOTE — ED Notes (Signed)
Pt states 2 weeks ago she feel and since the fall she is having increasing chest pain now on the left side, states worsening pain with deep breathes, pt grabbing left sided during assessment

## 2014-11-28 NOTE — ED Notes (Signed)
Incentive spirometer given; pt instructed on use and demonstrated understanding.

## 2014-11-28 NOTE — Telephone Encounter (Signed)
Patient [name]  DOB: 1926-05-21    Initial Comment Caller states she is having side pain, fell a couple weeks ago, cannot catch breath.    Nurse Assessment  Nurse: Wayne Sever, RN, Tillie Rung Date/Time Eilene Ghazi Time): 11/28/2014 10:06:24 AM  Confirm and document reason for call. If symptomatic, describe symptoms. ---Caller states she fell a couple of weeks ago. She states, "I have been getting a hacky cough and now I cannot breath deep." "It hurts when I take a deep breath."  Has the patient traveled out of the country within the last 30 days? ---Not Applicable  Does the patient have any new or worsening symptoms? ---Yes  Will a triage be completed? ---Yes  Related visit to physician within the last 2 weeks? ---N/A  Does the PT have any chronic conditions? (i.e. diabetes, asthma, etc.) ---Yes  List chronic conditions. ---Heart Issues, Home Dialysis     Guidelines    Guideline Title Affirmed Question Affirmed Notes  Cough - Acute Non-Productive Chest pain (Exception: MILD central chest pain, present only when coughing)    Final Disposition User   Go to ED Now Wayne Sever, RN, [facility] - ED

## 2014-11-28 NOTE — Telephone Encounter (Signed)
FYI - Pt was advised to be seen at ED

## 2014-11-28 NOTE — ED Notes (Signed)
Critical lab: troponin 0.06, MD notofied

## 2014-11-29 DIAGNOSIS — D509 Iron deficiency anemia, unspecified: Secondary | ICD-10-CM | POA: Diagnosis not present

## 2014-11-29 DIAGNOSIS — Z992 Dependence on renal dialysis: Secondary | ICD-10-CM | POA: Diagnosis not present

## 2014-11-29 DIAGNOSIS — N186 End stage renal disease: Secondary | ICD-10-CM | POA: Diagnosis not present

## 2014-11-30 DIAGNOSIS — N186 End stage renal disease: Secondary | ICD-10-CM | POA: Diagnosis not present

## 2014-11-30 DIAGNOSIS — Z992 Dependence on renal dialysis: Secondary | ICD-10-CM | POA: Diagnosis not present

## 2014-11-30 DIAGNOSIS — D509 Iron deficiency anemia, unspecified: Secondary | ICD-10-CM | POA: Diagnosis not present

## 2014-12-01 DIAGNOSIS — N186 End stage renal disease: Secondary | ICD-10-CM | POA: Diagnosis not present

## 2014-12-01 DIAGNOSIS — Z992 Dependence on renal dialysis: Secondary | ICD-10-CM | POA: Diagnosis not present

## 2014-12-01 DIAGNOSIS — D509 Iron deficiency anemia, unspecified: Secondary | ICD-10-CM | POA: Diagnosis not present

## 2014-12-03 DIAGNOSIS — Z992 Dependence on renal dialysis: Secondary | ICD-10-CM | POA: Diagnosis not present

## 2014-12-03 DIAGNOSIS — N186 End stage renal disease: Secondary | ICD-10-CM | POA: Diagnosis not present

## 2014-12-03 DIAGNOSIS — D509 Iron deficiency anemia, unspecified: Secondary | ICD-10-CM | POA: Diagnosis not present

## 2014-12-04 DIAGNOSIS — N186 End stage renal disease: Secondary | ICD-10-CM | POA: Diagnosis not present

## 2014-12-04 DIAGNOSIS — D509 Iron deficiency anemia, unspecified: Secondary | ICD-10-CM | POA: Diagnosis not present

## 2014-12-04 DIAGNOSIS — Z992 Dependence on renal dialysis: Secondary | ICD-10-CM | POA: Diagnosis not present

## 2014-12-05 DIAGNOSIS — N186 End stage renal disease: Secondary | ICD-10-CM | POA: Diagnosis not present

## 2014-12-05 DIAGNOSIS — Z992 Dependence on renal dialysis: Secondary | ICD-10-CM | POA: Diagnosis not present

## 2014-12-05 DIAGNOSIS — D509 Iron deficiency anemia, unspecified: Secondary | ICD-10-CM | POA: Diagnosis not present

## 2014-12-06 ENCOUNTER — Ambulatory Visit: Payer: Medicare Other

## 2014-12-06 DIAGNOSIS — N186 End stage renal disease: Secondary | ICD-10-CM | POA: Diagnosis not present

## 2014-12-06 DIAGNOSIS — Z992 Dependence on renal dialysis: Secondary | ICD-10-CM | POA: Diagnosis not present

## 2014-12-06 DIAGNOSIS — D509 Iron deficiency anemia, unspecified: Secondary | ICD-10-CM | POA: Diagnosis not present

## 2014-12-07 DIAGNOSIS — N186 End stage renal disease: Secondary | ICD-10-CM | POA: Diagnosis not present

## 2014-12-07 DIAGNOSIS — D509 Iron deficiency anemia, unspecified: Secondary | ICD-10-CM | POA: Diagnosis not present

## 2014-12-07 DIAGNOSIS — Z992 Dependence on renal dialysis: Secondary | ICD-10-CM | POA: Diagnosis not present

## 2014-12-08 DIAGNOSIS — D509 Iron deficiency anemia, unspecified: Secondary | ICD-10-CM | POA: Diagnosis not present

## 2014-12-08 DIAGNOSIS — N186 End stage renal disease: Secondary | ICD-10-CM | POA: Diagnosis not present

## 2014-12-08 DIAGNOSIS — Z992 Dependence on renal dialysis: Secondary | ICD-10-CM | POA: Diagnosis not present

## 2014-12-10 DIAGNOSIS — D509 Iron deficiency anemia, unspecified: Secondary | ICD-10-CM | POA: Diagnosis not present

## 2014-12-10 DIAGNOSIS — N186 End stage renal disease: Secondary | ICD-10-CM | POA: Diagnosis not present

## 2014-12-10 DIAGNOSIS — Z992 Dependence on renal dialysis: Secondary | ICD-10-CM | POA: Diagnosis not present

## 2014-12-11 DIAGNOSIS — Z992 Dependence on renal dialysis: Secondary | ICD-10-CM | POA: Diagnosis not present

## 2014-12-11 DIAGNOSIS — D509 Iron deficiency anemia, unspecified: Secondary | ICD-10-CM | POA: Diagnosis not present

## 2014-12-11 DIAGNOSIS — N186 End stage renal disease: Secondary | ICD-10-CM | POA: Diagnosis not present

## 2014-12-13 DIAGNOSIS — N186 End stage renal disease: Secondary | ICD-10-CM | POA: Diagnosis not present

## 2014-12-13 DIAGNOSIS — D509 Iron deficiency anemia, unspecified: Secondary | ICD-10-CM | POA: Diagnosis not present

## 2014-12-13 DIAGNOSIS — Z992 Dependence on renal dialysis: Secondary | ICD-10-CM | POA: Diagnosis not present

## 2014-12-14 DIAGNOSIS — N186 End stage renal disease: Secondary | ICD-10-CM | POA: Diagnosis not present

## 2014-12-14 DIAGNOSIS — D509 Iron deficiency anemia, unspecified: Secondary | ICD-10-CM | POA: Diagnosis not present

## 2014-12-14 DIAGNOSIS — Z992 Dependence on renal dialysis: Secondary | ICD-10-CM | POA: Diagnosis not present

## 2014-12-15 DIAGNOSIS — N186 End stage renal disease: Secondary | ICD-10-CM | POA: Diagnosis not present

## 2014-12-15 DIAGNOSIS — Z992 Dependence on renal dialysis: Secondary | ICD-10-CM | POA: Diagnosis not present

## 2014-12-15 DIAGNOSIS — D509 Iron deficiency anemia, unspecified: Secondary | ICD-10-CM | POA: Diagnosis not present

## 2014-12-17 DIAGNOSIS — N186 End stage renal disease: Secondary | ICD-10-CM | POA: Diagnosis not present

## 2014-12-17 DIAGNOSIS — D509 Iron deficiency anemia, unspecified: Secondary | ICD-10-CM | POA: Diagnosis not present

## 2014-12-17 DIAGNOSIS — Z992 Dependence on renal dialysis: Secondary | ICD-10-CM | POA: Diagnosis not present

## 2014-12-18 ENCOUNTER — Encounter: Payer: Self-pay | Admitting: Internal Medicine

## 2014-12-18 ENCOUNTER — Ambulatory Visit (INDEPENDENT_AMBULATORY_CARE_PROVIDER_SITE_OTHER): Payer: Medicare Other | Admitting: Internal Medicine

## 2014-12-18 VITALS — BP 145/75 | HR 80 | Temp 97.6°F | Ht 64.0 in | Wt 123.0 lb

## 2014-12-18 DIAGNOSIS — F32A Depression, unspecified: Secondary | ICD-10-CM

## 2014-12-18 DIAGNOSIS — Z992 Dependence on renal dialysis: Secondary | ICD-10-CM | POA: Diagnosis not present

## 2014-12-18 DIAGNOSIS — R208 Other disturbances of skin sensation: Secondary | ICD-10-CM

## 2014-12-18 DIAGNOSIS — D509 Iron deficiency anemia, unspecified: Secondary | ICD-10-CM | POA: Diagnosis not present

## 2014-12-18 DIAGNOSIS — N185 Chronic kidney disease, stage 5: Secondary | ICD-10-CM

## 2014-12-18 DIAGNOSIS — F329 Major depressive disorder, single episode, unspecified: Secondary | ICD-10-CM | POA: Diagnosis not present

## 2014-12-18 DIAGNOSIS — N186 End stage renal disease: Secondary | ICD-10-CM | POA: Diagnosis not present

## 2014-12-18 MED ORDER — VENLAFAXINE HCL 25 MG PO TABS: 25.0000 mg | ORAL_TABLET | Freq: Two times a day (BID) | ORAL | Status: DC

## 2014-12-18 NOTE — Assessment & Plan Note (Signed)
Continues on PD. CT pending to evaluate bleeding from PD catheter.

## 2014-12-18 NOTE — Progress Notes (Signed)
Pre visit review using our clinic review tool, if applicable. No additional management support is needed unless otherwise documented below in the visit note. 

## 2014-12-18 NOTE — Assessment & Plan Note (Signed)
Chronic "flashing" electrical shock sensation during times of stress. She reports that medication helped with this in the past, however minimal benefit with Sertraline and no improvement with Abilify. Recent CT head showed no acute process. Will try using Effexor starting at 25mg  bid. Discussed setting up neurology evaluation, and questioned if this might be seizure activity, but she declines for now. Follow up in 4 weeks and prn.

## 2014-12-18 NOTE — Progress Notes (Signed)
Subjective:    Patient ID: Sharon [name], female    DOB: 18-Jul-1926, 81 y.o.   MRN: [id number]  HPI  81YO female presents for follow up.  Last seen on 10/26 by Dr. Caryl Bis for headache. Symptoms thought secondary to Abilify, so pt was weaned off this medication. Head CT was normal. Also then seen in ED 11/2 for chest wall pain. CXR showed no acute findings.  No change in symptoms of "flashing" in head with stopping Abilify. Also continues to have headaches. These are described as mild and diffuse. She does not take anything for pain. Flashing symptoms seem to occur when tired or stressed out. Continues to have some symptoms of depression. She feels that depression and anxiety lead to these "flashing" symptoms. This has occurred off and on for years. She recalls medication helping in the past, possibly elavil.  ESRD - Seen by nephrology this morning. Scheduled to have CT to evaluate bleeding from PD catheter.  Wt Readings from Last 3 Encounters:  12/18/14 123 lb (55.792 kg)  11/28/14 125 lb (56.7 kg)  11/21/14 127 lb 9.6 oz (57.879 kg)   BP Readings from Last 3 Encounters:  12/18/14 145/75  11/28/14 157/82  11/21/14 120/64    Past Medical History  Diagnosis Date  . Peritoneal dialysis status ([place])   . Meniere disease   . Meniere's disease   . Kidney failure July 2012  . Kidney failure    Family History  Problem Relation Age of Onset  . Cancer Mother     ? ovarian - sarcoma  . Cancer Father     Skin cancer  . Diabetes Sister   . COPD Sister   . Depression Sister   . Cancer Sister     Lung - 9 yrs old   Past Surgical History  Procedure Laterality Date  . Pacemaker insertion  2006  . Cholecystectomy  01/2008  . Total knee arthroplasty  2008    LEFT/Dr Calif  . Cataract extraction  2006, 2011  . Insertion of dialysis catheter  07/2010  . Abdominal adhesion surgery  1958   Social History   Social History  . Marital Status: Married    Spouse Name: N/A  .  Number of Children: 0  . Years of Education: N/A   Occupational History  . Retired     Social History Main Topics  . Smoking status: Former Smoker    Quit date: 03/31/1948  . Smokeless tobacco: Never Used  . Alcohol Use: No  . Drug Use: No  . Sexual Activity: Not Asked   Other Topics Concern  . None   Social History Narrative   Lives in [place] alone. Widow 2012.      Regular Exercise -  Housework   Daily Caffeine Use:  1 - 2 cups coffee             Review of Systems  Constitutional: Positive for fatigue. Negative for fever, chills, appetite change and unexpected weight change.  Eyes: Negative for visual disturbance.  Respiratory: Negative for shortness of breath.   Cardiovascular: Negative for chest pain and leg swelling.  Gastrointestinal: Negative for nausea, vomiting, abdominal pain, diarrhea and constipation.  Musculoskeletal: Positive for gait problem (uses Urania Pearlman).  Skin: Negative for color change and rash.  Neurological: Positive for headaches. Negative for dizziness, facial asymmetry, weakness and light-headedness.  Hematological: Negative for adenopathy. Does not bruise/bleed easily.  Psychiatric/Behavioral: Positive for dysphoric mood. Negative for suicidal ideas and sleep disturbance. The  patient is nervous/anxious.        Objective:    BP 145/75 mmHg  Pulse 80  Temp(Src) 97.6 F (36.4 C) (Oral)  Ht 5\' 4"  (1.626 m)  Wt 123 lb (55.792 kg)  BMI 21.10 kg/m2  SpO2 98% Physical Exam  Constitutional: She is oriented to person, place, and time. She appears well-developed and well-nourished. No distress.  HENT:  Head: Normocephalic and atraumatic.  Right Ear: External ear normal.  Left Ear: External ear normal.  Nose: Nose normal.  Mouth/Throat: Oropharynx is clear and moist. No oropharyngeal exudate.  Eyes: Conjunctivae are normal. Pupils are equal, round, and reactive to light. Right eye exhibits no discharge. Left eye exhibits no discharge. No  scleral icterus.  Neck: Normal range of motion. Neck supple. No tracheal deviation present. No thyromegaly present.  Cardiovascular: Normal rate, regular rhythm, normal heart sounds and intact distal pulses.  Exam reveals no gallop and no friction rub.   No murmur heard. Pulmonary/Chest: Effort normal and breath sounds normal. No respiratory distress. She has no wheezes. She has no rales. She exhibits no tenderness.  Musculoskeletal: Normal range of motion. She exhibits no edema or tenderness.  Lymphadenopathy:    She has no cervical adenopathy.  Neurological: She is alert and oriented to person, place, and time. No cranial nerve deficit. She exhibits normal muscle tone. Coordination normal.  Skin: Skin is warm and dry. No rash noted. She is not diaphoretic. No erythema. No pallor.  Psychiatric: She has a normal mood and affect. Her behavior is normal. Judgment and thought content normal.          Assessment & Plan:   Problem List Items Addressed This Visit      Unprioritized   Chronic kidney disease, stage 5, kidney failure ([place])    Continues on PD. CT pending to evaluate bleeding from PD catheter.      Depression    Symptoms poorly controlled. Will try adding Effexor. Discussed potential risks of this medication. Continue Sertraline. Follow up 4 weeks and prn.      Relevant Medications   venlafaxine (EFFEXOR) 25 MG tablet   Electrical shock sensation in right posterior head - Primary    Chronic "flashing" electrical shock sensation during times of stress. She reports that medication helped with this in the past, however minimal benefit with Sertraline and no improvement with Abilify. Recent CT head showed no acute process. Will try using Effexor starting at 25mg  bid. Discussed setting up neurology evaluation, and questioned if this might be seizure activity, but she declines for now. Follow up in 4 weeks and prn.          Return in about 4 weeks (around 01/15/2015).

## 2014-12-18 NOTE — Patient Instructions (Signed)
Start Effexor (Venlafaxine) 25mg  twice daily.  Continue Sertraline (Zoloft) 150mg  at bedtime.  Follow up in 4 weeks or sooner as needed.

## 2014-12-18 NOTE — Assessment & Plan Note (Signed)
Symptoms poorly controlled. Will try adding Effexor. Discussed potential risks of this medication. Continue Sertraline. Follow up 4 weeks and prn.

## 2014-12-20 DIAGNOSIS — Z992 Dependence on renal dialysis: Secondary | ICD-10-CM | POA: Diagnosis not present

## 2014-12-20 DIAGNOSIS — D509 Iron deficiency anemia, unspecified: Secondary | ICD-10-CM | POA: Diagnosis not present

## 2014-12-20 DIAGNOSIS — N186 End stage renal disease: Secondary | ICD-10-CM | POA: Diagnosis not present

## 2014-12-21 DIAGNOSIS — N186 End stage renal disease: Secondary | ICD-10-CM | POA: Diagnosis not present

## 2014-12-21 DIAGNOSIS — Z992 Dependence on renal dialysis: Secondary | ICD-10-CM | POA: Diagnosis not present

## 2014-12-21 DIAGNOSIS — D509 Iron deficiency anemia, unspecified: Secondary | ICD-10-CM | POA: Diagnosis not present

## 2014-12-22 DIAGNOSIS — D509 Iron deficiency anemia, unspecified: Secondary | ICD-10-CM | POA: Diagnosis not present

## 2014-12-22 DIAGNOSIS — Z992 Dependence on renal dialysis: Secondary | ICD-10-CM | POA: Diagnosis not present

## 2014-12-22 DIAGNOSIS — N186 End stage renal disease: Secondary | ICD-10-CM | POA: Diagnosis not present

## 2014-12-24 DIAGNOSIS — Z992 Dependence on renal dialysis: Secondary | ICD-10-CM | POA: Diagnosis not present

## 2014-12-24 DIAGNOSIS — N186 End stage renal disease: Secondary | ICD-10-CM | POA: Diagnosis not present

## 2014-12-24 DIAGNOSIS — D509 Iron deficiency anemia, unspecified: Secondary | ICD-10-CM | POA: Diagnosis not present

## 2014-12-25 ENCOUNTER — Other Ambulatory Visit: Payer: Self-pay | Admitting: Nephrology

## 2014-12-25 DIAGNOSIS — N186 End stage renal disease: Secondary | ICD-10-CM | POA: Diagnosis not present

## 2014-12-25 DIAGNOSIS — K661 Hemoperitoneum: Secondary | ICD-10-CM

## 2014-12-25 DIAGNOSIS — Z992 Dependence on renal dialysis: Secondary | ICD-10-CM | POA: Diagnosis not present

## 2014-12-25 DIAGNOSIS — D509 Iron deficiency anemia, unspecified: Secondary | ICD-10-CM | POA: Diagnosis not present

## 2014-12-27 DIAGNOSIS — Z992 Dependence on renal dialysis: Secondary | ICD-10-CM | POA: Diagnosis not present

## 2014-12-27 DIAGNOSIS — N186 End stage renal disease: Secondary | ICD-10-CM | POA: Diagnosis not present

## 2014-12-28 ENCOUNTER — Ambulatory Visit
Admission: RE | Admit: 2014-12-28 | Discharge: 2014-12-28 | Disposition: A | Discharge status: Home / Self Care | Payer: Medicare Other | Source: Ambulatory Visit | Attending: Nephrology | Admitting: Nephrology

## 2014-12-28 DIAGNOSIS — I2510 Atherosclerotic heart disease of native coronary artery without angina pectoris: Secondary | ICD-10-CM | POA: Insufficient documentation

## 2014-12-28 DIAGNOSIS — R935 Abnormal findings on diagnostic imaging of other abdominal regions, including retroperitoneum: Secondary | ICD-10-CM | POA: Diagnosis not present

## 2014-12-28 DIAGNOSIS — Z992 Dependence on renal dialysis: Secondary | ICD-10-CM | POA: Diagnosis not present

## 2014-12-28 DIAGNOSIS — N186 End stage renal disease: Secondary | ICD-10-CM | POA: Diagnosis not present

## 2014-12-28 DIAGNOSIS — K5730 Diverticulosis of large intestine without perforation or abscess without bleeding: Secondary | ICD-10-CM | POA: Insufficient documentation

## 2014-12-28 DIAGNOSIS — N281 Cyst of kidney, acquired: Secondary | ICD-10-CM | POA: Insufficient documentation

## 2014-12-28 DIAGNOSIS — I517 Cardiomegaly: Secondary | ICD-10-CM | POA: Diagnosis not present

## 2014-12-28 DIAGNOSIS — K661 Hemoperitoneum: Secondary | ICD-10-CM | POA: Diagnosis not present

## 2014-12-29 DIAGNOSIS — N186 End stage renal disease: Secondary | ICD-10-CM | POA: Diagnosis not present

## 2014-12-29 DIAGNOSIS — Z992 Dependence on renal dialysis: Secondary | ICD-10-CM | POA: Diagnosis not present

## 2014-12-31 DIAGNOSIS — Z992 Dependence on renal dialysis: Secondary | ICD-10-CM | POA: Diagnosis not present

## 2014-12-31 DIAGNOSIS — N186 End stage renal disease: Secondary | ICD-10-CM | POA: Diagnosis not present

## 2015-01-01 DIAGNOSIS — N186 End stage renal disease: Secondary | ICD-10-CM | POA: Diagnosis not present

## 2015-01-01 DIAGNOSIS — Z992 Dependence on renal dialysis: Secondary | ICD-10-CM | POA: Diagnosis not present

## 2015-01-02 DIAGNOSIS — D509 Iron deficiency anemia, unspecified: Secondary | ICD-10-CM | POA: Diagnosis not present

## 2015-01-02 DIAGNOSIS — D631 Anemia in chronic kidney disease: Secondary | ICD-10-CM | POA: Diagnosis not present

## 2015-01-02 DIAGNOSIS — N186 End stage renal disease: Secondary | ICD-10-CM | POA: Diagnosis not present

## 2015-01-02 DIAGNOSIS — N2581 Secondary hyperparathyroidism of renal origin: Secondary | ICD-10-CM | POA: Diagnosis not present

## 2015-01-02 DIAGNOSIS — Z992 Dependence on renal dialysis: Secondary | ICD-10-CM | POA: Diagnosis not present

## 2015-01-03 DIAGNOSIS — N186 End stage renal disease: Secondary | ICD-10-CM | POA: Diagnosis not present

## 2015-01-03 DIAGNOSIS — Z992 Dependence on renal dialysis: Secondary | ICD-10-CM | POA: Diagnosis not present

## 2015-01-03 DIAGNOSIS — D631 Anemia in chronic kidney disease: Secondary | ICD-10-CM | POA: Diagnosis not present

## 2015-01-03 DIAGNOSIS — N2581 Secondary hyperparathyroidism of renal origin: Secondary | ICD-10-CM | POA: Diagnosis not present

## 2015-01-03 DIAGNOSIS — D509 Iron deficiency anemia, unspecified: Secondary | ICD-10-CM | POA: Diagnosis not present

## 2015-01-04 DIAGNOSIS — N186 End stage renal disease: Secondary | ICD-10-CM | POA: Diagnosis not present

## 2015-01-04 DIAGNOSIS — N2581 Secondary hyperparathyroidism of renal origin: Secondary | ICD-10-CM | POA: Diagnosis not present

## 2015-01-04 DIAGNOSIS — D509 Iron deficiency anemia, unspecified: Secondary | ICD-10-CM | POA: Diagnosis not present

## 2015-01-04 DIAGNOSIS — D631 Anemia in chronic kidney disease: Secondary | ICD-10-CM | POA: Diagnosis not present

## 2015-01-04 DIAGNOSIS — Z992 Dependence on renal dialysis: Secondary | ICD-10-CM | POA: Diagnosis not present

## 2015-01-05 DIAGNOSIS — D509 Iron deficiency anemia, unspecified: Secondary | ICD-10-CM | POA: Diagnosis not present

## 2015-01-05 DIAGNOSIS — N186 End stage renal disease: Secondary | ICD-10-CM | POA: Diagnosis not present

## 2015-01-05 DIAGNOSIS — Z992 Dependence on renal dialysis: Secondary | ICD-10-CM | POA: Diagnosis not present

## 2015-01-05 DIAGNOSIS — D631 Anemia in chronic kidney disease: Secondary | ICD-10-CM | POA: Diagnosis not present

## 2015-01-05 DIAGNOSIS — N2581 Secondary hyperparathyroidism of renal origin: Secondary | ICD-10-CM | POA: Diagnosis not present

## 2015-01-06 DIAGNOSIS — Z992 Dependence on renal dialysis: Secondary | ICD-10-CM | POA: Diagnosis not present

## 2015-01-06 DIAGNOSIS — D631 Anemia in chronic kidney disease: Secondary | ICD-10-CM | POA: Diagnosis not present

## 2015-01-06 DIAGNOSIS — N2581 Secondary hyperparathyroidism of renal origin: Secondary | ICD-10-CM | POA: Diagnosis not present

## 2015-01-06 DIAGNOSIS — D509 Iron deficiency anemia, unspecified: Secondary | ICD-10-CM | POA: Diagnosis not present

## 2015-01-06 DIAGNOSIS — N186 End stage renal disease: Secondary | ICD-10-CM | POA: Diagnosis not present

## 2015-01-07 ENCOUNTER — Telehealth: Payer: Self-pay | Admitting: Internal Medicine

## 2015-01-07 DIAGNOSIS — N186 End stage renal disease: Secondary | ICD-10-CM | POA: Diagnosis not present

## 2015-01-07 DIAGNOSIS — D509 Iron deficiency anemia, unspecified: Secondary | ICD-10-CM | POA: Diagnosis not present

## 2015-01-07 DIAGNOSIS — Z992 Dependence on renal dialysis: Secondary | ICD-10-CM | POA: Diagnosis not present

## 2015-01-07 DIAGNOSIS — D631 Anemia in chronic kidney disease: Secondary | ICD-10-CM | POA: Diagnosis not present

## 2015-01-07 DIAGNOSIS — N2581 Secondary hyperparathyroidism of renal origin: Secondary | ICD-10-CM | POA: Diagnosis not present

## 2015-01-07 NOTE — Telephone Encounter (Signed)
Patient [name]  DOB: 14-Jan-1927    Initial Comment Caller states the doctor made changes to her medications (Zoloft, Venlafaxine) on 11/22, has been having shortness of breath, nausea, difficulty with balance, and weight loss   Nurse Assessment  Nurse: Raphael Gibney, RN, Vanita Ingles Date/Time (Eastern Time): 01/07/2015 12:29:22 PM  Confirm and document reason for call. If symptomatic, describe symptoms. ---Caller states doctor recently put her on Venlafaxine on 11/22. Marland Kitchen Has been on Zoloft for a long time. She fell on Friday. She is off balance. She has a hacky cough. She has lost 5 lbs this week. She is SOB when exertion which is about the same. No swelling. She is light headed.  Has the patient traveled out of the country within the last 30 days? ---No  Does the patient have any new or worsening symptoms? ---Yes  Will a triage be completed? ---Yes  Related visit to physician within the last 2 weeks? ---No  Does the PT have any chronic conditions? (i.e. diabetes, asthma, etc.) ---Yes  List chronic conditions. ---pacemaker; dialysis at home  Is this a behavioral health or substance abuse call? ---No     Guidelines    Guideline Title Affirmed Question Affirmed Notes  Dizziness - Lightheadedness [1] MODERATE dizziness (e.g., interferes with normal activities) AND [2] has NOT been evaluated by physician for this (Exception: dizziness caused by heat exposure, sudden standing, or poor fluid intake)    Final Disposition User   See Physician within 24 Hours Raphael Gibney, RN, Vanita Ingles    Comments  appt scheduled for 01/08/15 with Lorane Gell   Referrals  REFERRED TO PCP OFFICE   Disagree/Comply: Leta Baptist

## 2015-01-08 ENCOUNTER — Encounter: Payer: Self-pay | Admitting: Nurse Practitioner

## 2015-01-08 ENCOUNTER — Ambulatory Visit (INDEPENDENT_AMBULATORY_CARE_PROVIDER_SITE_OTHER): Payer: Medicare Other | Admitting: Nurse Practitioner

## 2015-01-08 VITALS — BP 124/58 | HR 64 | Temp 98.0°F | Wt 119.0 lb

## 2015-01-08 DIAGNOSIS — R42 Dizziness and giddiness: Secondary | ICD-10-CM

## 2015-01-08 DIAGNOSIS — D631 Anemia in chronic kidney disease: Secondary | ICD-10-CM | POA: Diagnosis not present

## 2015-01-08 DIAGNOSIS — D509 Iron deficiency anemia, unspecified: Secondary | ICD-10-CM | POA: Diagnosis not present

## 2015-01-08 DIAGNOSIS — Y92009 Unspecified place in unspecified non-institutional (private) residence as the place of occurrence of the external cause: Principal | ICD-10-CM

## 2015-01-08 DIAGNOSIS — W19XXXA Unspecified fall, initial encounter: Secondary | ICD-10-CM

## 2015-01-08 DIAGNOSIS — Y92099 Unspecified place in other non-institutional residence as the place of occurrence of the external cause: Secondary | ICD-10-CM | POA: Diagnosis not present

## 2015-01-08 DIAGNOSIS — N186 End stage renal disease: Secondary | ICD-10-CM | POA: Diagnosis not present

## 2015-01-08 DIAGNOSIS — N2581 Secondary hyperparathyroidism of renal origin: Secondary | ICD-10-CM | POA: Diagnosis not present

## 2015-01-08 DIAGNOSIS — Z992 Dependence on renal dialysis: Secondary | ICD-10-CM | POA: Diagnosis not present

## 2015-01-08 NOTE — Patient Instructions (Signed)
Please follow up with Dr. Gilford Rile at your appointment next week.   Take your medications as prescribed.   Sit on the side of the bed before rising.   Ice on your head and arm.

## 2015-01-08 NOTE — Progress Notes (Signed)
Patient ID: Sharon [name], female    DOB: 07-25-1926  Age: 81 y.o. MRN: [id number]  CC: Dizziness   HPI Sharon [name] presents for CC of chronic "dizziness".   1) Pt has systolic heart failure with EF of 20% Fall previous to Friday was in Oct.   Flu like symptoms she reports pain "all over" and coughing, pain all resolved, balance worsened she reports she thinks due to Effexor.  Stopped the Effexor since Saturday. Skipped 4 dosages she reports because she did not care for the way it made her feel. She has been on this approximately 3 weeks. She reports the symptoms "due to effexor" were "confusion, nightmares, and made me a nervous wreck"  Friday fell down- Getting out of bed and fell forward, doesn't remember how fall happened, hit forehead slight bruising on right eyebrow and slight scrape to bridge of nose.   Orthostatics:  Sitting- 124/58 64 Lying- 138/82 88 Standing- 128/72 71  History Sharon [name] has a past medical history of Peritoneal dialysis status ([place]); Meniere disease; Meniere's disease; Kidney failure (July 2012); and Kidney failure.   She has past surgical history that includes Pacemaker insertion (2006); Cholecystectomy (01/2008); Total knee arthroplasty (2008); Cataract extraction (2006, 2011); Insertion of dialysis catheter (07/2010); and Abdominal adhesion surgery (1958).   Her family history includes COPD in her sister; Cancer in her father, mother, and sister; Depression in her sister; Diabetes in her sister.She reports that she quit smoking about 66 years ago. She has never used smokeless tobacco. She reports that she does not drink alcohol or use illicit drugs.  Outpatient Prescriptions Prior to Visit  Medication Sig Dispense Refill  . dicyclomine (BENTYL) 10 MG capsule Take 1 capsule (10 mg total) by mouth 3 (three) times daily as needed for spasms. 90 capsule 0  . folic acid-vitamin b complex-vitamin c-selenium-zinc (DIALYVITE) 3 MG TABS tablet Take 1 tablet by  mouth daily. 30 tablet 5  . gentamicin ointment (GARAMYCIN) 0.1 %     . sertraline (ZOLOFT) 50 MG tablet Take 3 tablets (150 mg total) by mouth daily. 90 tablet 6  . TOPROL XL 25 MG 24 hr tablet TAKE 1/2 BY MOUTH DAILY 30 tablet 1  . traMADol (ULTRAM) 50 MG tablet Take 1 tablet (50 mg total) by mouth every 6 (six) hours as needed. 20 tablet 0  . venlafaxine (EFFEXOR) 25 MG tablet Take 1 tablet (25 mg total) by mouth 2 (two) times daily. 60 tablet 3   No facility-administered medications prior to visit.    ROS Review of Systems  Constitutional: Negative for fever, chills, diaphoresis and fatigue.  Respiratory: Negative for chest tightness, shortness of breath and wheezing.   Cardiovascular: Negative for chest pain, palpitations and leg swelling.  Gastrointestinal: Negative for nausea, vomiting and diarrhea.  Musculoskeletal: Positive for gait problem.  Skin: Negative for rash.  Neurological: Positive for dizziness. Negative for weakness, numbness and headaches.  Psychiatric/Behavioral: Positive for confusion. The patient is nervous/anxious.     Objective:  BP 124/58 mmHg  Pulse 64  Temp(Src) 98 F (36.7 C) (Oral)  Wt 119 lb (53.978 kg)  SpO2 98%  Physical Exam  Constitutional: She is oriented to person, place, and time. She appears well-developed and well-nourished. No distress.  HENT:  Head: Normocephalic and atraumatic.  Right Ear: External ear normal.  Left Ear: External ear normal.  Cardiovascular: Normal rate, regular rhythm and normal heart sounds.  Exam reveals no gallop and no friction rub.   No murmur heard.  Pulmonary/Chest: Effort normal and breath sounds normal. No respiratory distress. She has no wheezes. She has no rales. She exhibits no tenderness.  Neurological: She is alert and oriented to person, place, and time. No cranial nerve deficit. She exhibits normal muscle tone. Coordination abnormal.  Using rolling walker  Skin: Skin is warm and dry. No rash noted.  She is not diaphoretic.  Ecchymosis on right eyebrow and slight healing scrape to bridge of nose  Psychiatric: She has a normal mood and affect. Her behavior is normal. Judgment and thought content normal.   Assessment & Plan:   Sharon [name] was seen today for dizziness.  Diagnoses and all orders for this visit:  Fall at home, initial encounter  I am having Sharon [name] maintain her gentamicin ointment, folic acid-vitamin b complex-vitamin c-selenium-zinc, dicyclomine, sertraline, TOPROL XL, traMADol, and venlafaxine.  No orders of the defined types were placed in this encounter.     Follow-up: Return if symptoms worsen or fail to improve.

## 2015-01-09 DIAGNOSIS — D631 Anemia in chronic kidney disease: Secondary | ICD-10-CM | POA: Diagnosis not present

## 2015-01-09 DIAGNOSIS — D509 Iron deficiency anemia, unspecified: Secondary | ICD-10-CM | POA: Diagnosis not present

## 2015-01-09 DIAGNOSIS — N2581 Secondary hyperparathyroidism of renal origin: Secondary | ICD-10-CM | POA: Diagnosis not present

## 2015-01-09 DIAGNOSIS — Z992 Dependence on renal dialysis: Secondary | ICD-10-CM | POA: Diagnosis not present

## 2015-01-09 DIAGNOSIS — N186 End stage renal disease: Secondary | ICD-10-CM | POA: Diagnosis not present

## 2015-01-10 DIAGNOSIS — N2581 Secondary hyperparathyroidism of renal origin: Secondary | ICD-10-CM | POA: Diagnosis not present

## 2015-01-10 DIAGNOSIS — D509 Iron deficiency anemia, unspecified: Secondary | ICD-10-CM | POA: Diagnosis not present

## 2015-01-10 DIAGNOSIS — N186 End stage renal disease: Secondary | ICD-10-CM | POA: Diagnosis not present

## 2015-01-10 DIAGNOSIS — Z992 Dependence on renal dialysis: Secondary | ICD-10-CM | POA: Diagnosis not present

## 2015-01-10 DIAGNOSIS — D631 Anemia in chronic kidney disease: Secondary | ICD-10-CM | POA: Diagnosis not present

## 2015-01-11 DIAGNOSIS — N2581 Secondary hyperparathyroidism of renal origin: Secondary | ICD-10-CM | POA: Diagnosis not present

## 2015-01-11 DIAGNOSIS — D631 Anemia in chronic kidney disease: Secondary | ICD-10-CM | POA: Diagnosis not present

## 2015-01-11 DIAGNOSIS — N186 End stage renal disease: Secondary | ICD-10-CM | POA: Diagnosis not present

## 2015-01-11 DIAGNOSIS — D509 Iron deficiency anemia, unspecified: Secondary | ICD-10-CM | POA: Diagnosis not present

## 2015-01-11 DIAGNOSIS — Z992 Dependence on renal dialysis: Secondary | ICD-10-CM | POA: Diagnosis not present

## 2015-01-12 DIAGNOSIS — D509 Iron deficiency anemia, unspecified: Secondary | ICD-10-CM | POA: Diagnosis not present

## 2015-01-12 DIAGNOSIS — N2581 Secondary hyperparathyroidism of renal origin: Secondary | ICD-10-CM | POA: Diagnosis not present

## 2015-01-12 DIAGNOSIS — N186 End stage renal disease: Secondary | ICD-10-CM | POA: Diagnosis not present

## 2015-01-12 DIAGNOSIS — D631 Anemia in chronic kidney disease: Secondary | ICD-10-CM | POA: Diagnosis not present

## 2015-01-12 DIAGNOSIS — Z992 Dependence on renal dialysis: Secondary | ICD-10-CM | POA: Diagnosis not present

## 2015-01-13 DIAGNOSIS — N186 End stage renal disease: Secondary | ICD-10-CM | POA: Diagnosis not present

## 2015-01-13 DIAGNOSIS — D631 Anemia in chronic kidney disease: Secondary | ICD-10-CM | POA: Diagnosis not present

## 2015-01-13 DIAGNOSIS — N2581 Secondary hyperparathyroidism of renal origin: Secondary | ICD-10-CM | POA: Diagnosis not present

## 2015-01-13 DIAGNOSIS — Z992 Dependence on renal dialysis: Secondary | ICD-10-CM | POA: Diagnosis not present

## 2015-01-13 DIAGNOSIS — D509 Iron deficiency anemia, unspecified: Secondary | ICD-10-CM | POA: Diagnosis not present

## 2015-01-14 DIAGNOSIS — N2581 Secondary hyperparathyroidism of renal origin: Secondary | ICD-10-CM | POA: Diagnosis not present

## 2015-01-14 DIAGNOSIS — D509 Iron deficiency anemia, unspecified: Secondary | ICD-10-CM | POA: Diagnosis not present

## 2015-01-14 DIAGNOSIS — D631 Anemia in chronic kidney disease: Secondary | ICD-10-CM | POA: Diagnosis not present

## 2015-01-14 DIAGNOSIS — Z992 Dependence on renal dialysis: Secondary | ICD-10-CM | POA: Diagnosis not present

## 2015-01-14 DIAGNOSIS — N186 End stage renal disease: Secondary | ICD-10-CM | POA: Diagnosis not present

## 2015-01-15 DIAGNOSIS — Z992 Dependence on renal dialysis: Secondary | ICD-10-CM | POA: Diagnosis not present

## 2015-01-15 DIAGNOSIS — N186 End stage renal disease: Secondary | ICD-10-CM | POA: Diagnosis not present

## 2015-01-15 DIAGNOSIS — D631 Anemia in chronic kidney disease: Secondary | ICD-10-CM | POA: Diagnosis not present

## 2015-01-15 DIAGNOSIS — N2581 Secondary hyperparathyroidism of renal origin: Secondary | ICD-10-CM | POA: Diagnosis not present

## 2015-01-15 DIAGNOSIS — D509 Iron deficiency anemia, unspecified: Secondary | ICD-10-CM | POA: Diagnosis not present

## 2015-01-15 NOTE — Assessment & Plan Note (Addendum)
Similar fall to last time. Pt is not on anti-coagulants or convincingly at risk for Subdural hematoma. Pt is a poor historian, but has concerns about her memory, which has been addressed at previous visits. She was using a rolling walker and coordination was okay at this time. Encouraged ice and follow up with Dr. Gilford Rile at her next visit.   Advised her to seek emergency care if she hits her head once more, falls, or becomes concerned after hours/weekends.   I spent 26 minutes face-to-face with greater than 50% of the time spent on confirmation on parts of HPI, counseling, and follow up.

## 2015-01-16 ENCOUNTER — Ambulatory Visit (INDEPENDENT_AMBULATORY_CARE_PROVIDER_SITE_OTHER): Payer: Medicare Other | Admitting: Internal Medicine

## 2015-01-16 ENCOUNTER — Encounter: Payer: Self-pay | Admitting: Internal Medicine

## 2015-01-16 VITALS — BP 145/75 | HR 79 | Temp 98.0°F | Ht 64.0 in | Wt 122.2 lb

## 2015-01-16 DIAGNOSIS — D631 Anemia in chronic kidney disease: Secondary | ICD-10-CM | POA: Diagnosis not present

## 2015-01-16 DIAGNOSIS — N185 Chronic kidney disease, stage 5: Secondary | ICD-10-CM

## 2015-01-16 DIAGNOSIS — F32A Depression, unspecified: Secondary | ICD-10-CM

## 2015-01-16 DIAGNOSIS — F329 Major depressive disorder, single episode, unspecified: Secondary | ICD-10-CM

## 2015-01-16 DIAGNOSIS — F411 Generalized anxiety disorder: Secondary | ICD-10-CM

## 2015-01-16 DIAGNOSIS — D509 Iron deficiency anemia, unspecified: Secondary | ICD-10-CM | POA: Diagnosis not present

## 2015-01-16 DIAGNOSIS — Z992 Dependence on renal dialysis: Secondary | ICD-10-CM | POA: Diagnosis not present

## 2015-01-16 DIAGNOSIS — N2581 Secondary hyperparathyroidism of renal origin: Secondary | ICD-10-CM | POA: Diagnosis not present

## 2015-01-16 DIAGNOSIS — N186 End stage renal disease: Secondary | ICD-10-CM | POA: Diagnosis not present

## 2015-01-16 NOTE — Progress Notes (Signed)
Subjective:    Patient ID: Sharon [name], female    DOB: 1926-12-04, 81 y.o.   MRN: [id number]  HPI  81YO female presents for follow up.  Seen multiple times over the last few months for depression, anxiety, and "electrical shock" sensation over her head as well as falls.  She feels that the Effexor was causing her symptoms. She stopped this medication. Feeling like herself again. Continues to have the "shock" sensation, however. This occurs when tired and stressed later in day.  Continues to take the Sertraline at bedtime. Tolerating well. No current symptoms of depression.  Things are going well with peritoneal dialysis. CT abdomen recently was stable.  No falls since last visit.   Wt Readings from Last 3 Encounters:  01/16/15 122 lb 4 oz (55.452 kg)  01/08/15 119 lb (53.978 kg)  12/18/14 123 lb (55.792 kg)   BP Readings from Last 3 Encounters:  01/16/15 145/75  01/08/15 124/58  12/18/14 145/75    Past Medical History  Diagnosis Date  . Peritoneal dialysis status ([place])   . Meniere disease   . Meniere's disease   . Kidney failure July 2012  . Kidney failure    Family History  Problem Relation Age of Onset  . Cancer Mother     ? ovarian - sarcoma  . Cancer Father     Skin cancer  . Diabetes Sister   . COPD Sister   . Depression Sister   . Cancer Sister     Lung - 44 yrs old   Past Surgical History  Procedure Laterality Date  . Pacemaker insertion  2006  . Cholecystectomy  01/2008  . Total knee arthroplasty  2008    LEFT/Dr Calif  . Cataract extraction  2006, 2011  . Insertion of dialysis catheter  07/2010  . Abdominal adhesion surgery  1958   Social History   Social History  . Marital Status: Married    Spouse Name: N/A  . Number of Children: 0  . Years of Education: N/A   Occupational History  . Retired     Social History Main Topics  . Smoking status: Former Smoker    Quit date: 03/31/1948  . Smokeless tobacco: Never Used  . Alcohol Use:  No  . Drug Use: No  . Sexual Activity: Not Asked   Other Topics Concern  . None   Social History Narrative   Lives in [place] alone. Widow 2012.      Regular Exercise -  Housework   Daily Caffeine Use:  1 - 2 cups coffee             Review of Systems  Constitutional: Negative for fever, chills, appetite change, fatigue and unexpected weight change.  Eyes: Negative for visual disturbance.  Respiratory: Negative for shortness of breath.   Cardiovascular: Negative for chest pain and leg swelling.  Gastrointestinal: Negative for nausea, vomiting, abdominal pain, diarrhea and constipation.  Musculoskeletal: Negative for myalgias and arthralgias.  Skin: Negative for color change and rash.  Hematological: Negative for adenopathy. Does not bruise/bleed easily.  Psychiatric/Behavioral: Negative for suicidal ideas, sleep disturbance and dysphoric mood. The patient is not nervous/anxious.        Objective:    BP 145/75 mmHg  Pulse 79  Temp(Src) 98 F (36.7 C) (Oral)  Ht 5\' 4"  (1.626 m)  Wt 122 lb 4 oz (55.452 kg)  BMI 20.97 kg/m2  SpO2 99% Physical Exam  Constitutional: She is oriented to person, place, and time.  She appears well-developed and well-nourished. No distress.  HENT:  Head: Normocephalic and atraumatic.  Right Ear: External ear normal.  Left Ear: External ear normal.  Nose: Nose normal.  Mouth/Throat: Oropharynx is clear and moist. No oropharyngeal exudate.  Eyes: Conjunctivae are normal. Pupils are equal, round, and reactive to light. Right eye exhibits no discharge. Left eye exhibits no discharge. No scleral icterus.  Neck: Normal range of motion. Neck supple. No tracheal deviation present. No thyromegaly present.  Cardiovascular: Normal rate, regular rhythm, normal heart sounds and intact distal pulses.  Exam reveals no gallop and no friction rub.   No murmur heard. Pulmonary/Chest: Effort normal and breath sounds normal. No respiratory distress. She has no  wheezes. She has no rales. She exhibits no tenderness.  Musculoskeletal: Normal range of motion. She exhibits no edema or tenderness.  Lymphadenopathy:    She has no cervical adenopathy.  Neurological: She is alert and oriented to person, place, and time. No cranial nerve deficit. She exhibits normal muscle tone. Coordination normal.  Skin: Skin is warm and dry. No rash noted. She is not diaphoretic. No erythema. No pallor.  Psychiatric: She has a normal mood and affect. Her behavior is normal. Judgment and thought content normal.          Assessment & Plan:  Over 93min of which >50% spent in face-to-face contact with patient discussing plan of care.  Problem List Items Addressed This Visit      Unprioritized   Anxiety state - Primary    Symptoms of anxiety have improved with stopping the Effexor. Will continue to monitor.      Chronic kidney disease, stage 5, kidney failure ([facility])    Reviewed recent CT scan of the abdomen. Continue PT with Dr. Holley Raring.      Depression    Symptoms well controlled with Sertraline.          Return in about 3 months (around 04/16/2015) for Recheck.

## 2015-01-16 NOTE — Assessment & Plan Note (Signed)
Symptoms of anxiety have improved with stopping the Effexor. Will continue to monitor.

## 2015-01-16 NOTE — Assessment & Plan Note (Signed)
Symptoms well controlled with Sertraline.

## 2015-01-16 NOTE — Patient Instructions (Addendum)
Continue current medications    Follow up in 3 months

## 2015-01-16 NOTE — Assessment & Plan Note (Signed)
Reviewed recent CT scan of the abdomen. Continue PT with Dr. Holley Raring.

## 2015-01-16 NOTE — Progress Notes (Signed)
Pre visit review using our clinic review tool, if applicable. No additional management support is needed unless otherwise documented below in the visit note. 

## 2015-01-17 DIAGNOSIS — D509 Iron deficiency anemia, unspecified: Secondary | ICD-10-CM | POA: Diagnosis not present

## 2015-01-17 DIAGNOSIS — N186 End stage renal disease: Secondary | ICD-10-CM | POA: Diagnosis not present

## 2015-01-17 DIAGNOSIS — D631 Anemia in chronic kidney disease: Secondary | ICD-10-CM | POA: Diagnosis not present

## 2015-01-17 DIAGNOSIS — Z992 Dependence on renal dialysis: Secondary | ICD-10-CM | POA: Diagnosis not present

## 2015-01-17 DIAGNOSIS — N2581 Secondary hyperparathyroidism of renal origin: Secondary | ICD-10-CM | POA: Diagnosis not present

## 2015-01-18 DIAGNOSIS — D631 Anemia in chronic kidney disease: Secondary | ICD-10-CM | POA: Diagnosis not present

## 2015-01-18 DIAGNOSIS — Z992 Dependence on renal dialysis: Secondary | ICD-10-CM | POA: Diagnosis not present

## 2015-01-18 DIAGNOSIS — D509 Iron deficiency anemia, unspecified: Secondary | ICD-10-CM | POA: Diagnosis not present

## 2015-01-18 DIAGNOSIS — N186 End stage renal disease: Secondary | ICD-10-CM | POA: Diagnosis not present

## 2015-01-18 DIAGNOSIS — N2581 Secondary hyperparathyroidism of renal origin: Secondary | ICD-10-CM | POA: Diagnosis not present

## 2015-01-19 DIAGNOSIS — N186 End stage renal disease: Secondary | ICD-10-CM | POA: Diagnosis not present

## 2015-01-19 DIAGNOSIS — D631 Anemia in chronic kidney disease: Secondary | ICD-10-CM | POA: Diagnosis not present

## 2015-01-19 DIAGNOSIS — N2581 Secondary hyperparathyroidism of renal origin: Secondary | ICD-10-CM | POA: Diagnosis not present

## 2015-01-19 DIAGNOSIS — D509 Iron deficiency anemia, unspecified: Secondary | ICD-10-CM | POA: Diagnosis not present

## 2015-01-19 DIAGNOSIS — Z992 Dependence on renal dialysis: Secondary | ICD-10-CM | POA: Diagnosis not present

## 2015-01-20 DIAGNOSIS — N186 End stage renal disease: Secondary | ICD-10-CM | POA: Diagnosis not present

## 2015-01-20 DIAGNOSIS — D631 Anemia in chronic kidney disease: Secondary | ICD-10-CM | POA: Diagnosis not present

## 2015-01-20 DIAGNOSIS — Z992 Dependence on renal dialysis: Secondary | ICD-10-CM | POA: Diagnosis not present

## 2015-01-20 DIAGNOSIS — N2581 Secondary hyperparathyroidism of renal origin: Secondary | ICD-10-CM | POA: Diagnosis not present

## 2015-01-20 DIAGNOSIS — D509 Iron deficiency anemia, unspecified: Secondary | ICD-10-CM | POA: Diagnosis not present

## 2015-01-21 DIAGNOSIS — D509 Iron deficiency anemia, unspecified: Secondary | ICD-10-CM | POA: Diagnosis not present

## 2015-01-21 DIAGNOSIS — N186 End stage renal disease: Secondary | ICD-10-CM | POA: Diagnosis not present

## 2015-01-21 DIAGNOSIS — N2581 Secondary hyperparathyroidism of renal origin: Secondary | ICD-10-CM | POA: Diagnosis not present

## 2015-01-21 DIAGNOSIS — Z992 Dependence on renal dialysis: Secondary | ICD-10-CM | POA: Diagnosis not present

## 2015-01-21 DIAGNOSIS — D631 Anemia in chronic kidney disease: Secondary | ICD-10-CM | POA: Diagnosis not present

## 2015-01-22 DIAGNOSIS — D631 Anemia in chronic kidney disease: Secondary | ICD-10-CM | POA: Diagnosis not present

## 2015-01-22 DIAGNOSIS — D509 Iron deficiency anemia, unspecified: Secondary | ICD-10-CM | POA: Diagnosis not present

## 2015-01-22 DIAGNOSIS — Z992 Dependence on renal dialysis: Secondary | ICD-10-CM | POA: Diagnosis not present

## 2015-01-22 DIAGNOSIS — N2581 Secondary hyperparathyroidism of renal origin: Secondary | ICD-10-CM | POA: Diagnosis not present

## 2015-01-22 DIAGNOSIS — N186 End stage renal disease: Secondary | ICD-10-CM | POA: Diagnosis not present

## 2015-01-23 DIAGNOSIS — Z992 Dependence on renal dialysis: Secondary | ICD-10-CM | POA: Diagnosis not present

## 2015-01-23 DIAGNOSIS — N186 End stage renal disease: Secondary | ICD-10-CM | POA: Diagnosis not present

## 2015-01-23 DIAGNOSIS — I2510 Atherosclerotic heart disease of native coronary artery without angina pectoris: Secondary | ICD-10-CM | POA: Diagnosis not present

## 2015-01-23 DIAGNOSIS — D509 Iron deficiency anemia, unspecified: Secondary | ICD-10-CM | POA: Diagnosis not present

## 2015-01-23 DIAGNOSIS — Y92099 Unspecified place in other non-institutional residence as the place of occurrence of the external cause: Secondary | ICD-10-CM | POA: Diagnosis not present

## 2015-01-23 DIAGNOSIS — I10 Essential (primary) hypertension: Secondary | ICD-10-CM | POA: Diagnosis not present

## 2015-01-23 DIAGNOSIS — I5022 Chronic systolic (congestive) heart failure: Secondary | ICD-10-CM | POA: Diagnosis not present

## 2015-01-23 DIAGNOSIS — N2581 Secondary hyperparathyroidism of renal origin: Secondary | ICD-10-CM | POA: Diagnosis not present

## 2015-01-23 DIAGNOSIS — D631 Anemia in chronic kidney disease: Secondary | ICD-10-CM | POA: Diagnosis not present

## 2015-01-24 DIAGNOSIS — D509 Iron deficiency anemia, unspecified: Secondary | ICD-10-CM | POA: Diagnosis not present

## 2015-01-24 DIAGNOSIS — N2581 Secondary hyperparathyroidism of renal origin: Secondary | ICD-10-CM | POA: Diagnosis not present

## 2015-01-24 DIAGNOSIS — N186 End stage renal disease: Secondary | ICD-10-CM | POA: Diagnosis not present

## 2015-01-24 DIAGNOSIS — D631 Anemia in chronic kidney disease: Secondary | ICD-10-CM | POA: Diagnosis not present

## 2015-01-24 DIAGNOSIS — Z992 Dependence on renal dialysis: Secondary | ICD-10-CM | POA: Diagnosis not present

## 2015-01-25 DIAGNOSIS — N186 End stage renal disease: Secondary | ICD-10-CM | POA: Diagnosis not present

## 2015-01-25 DIAGNOSIS — N2581 Secondary hyperparathyroidism of renal origin: Secondary | ICD-10-CM | POA: Diagnosis not present

## 2015-01-25 DIAGNOSIS — Z992 Dependence on renal dialysis: Secondary | ICD-10-CM | POA: Diagnosis not present

## 2015-01-25 DIAGNOSIS — D509 Iron deficiency anemia, unspecified: Secondary | ICD-10-CM | POA: Diagnosis not present

## 2015-01-25 DIAGNOSIS — D631 Anemia in chronic kidney disease: Secondary | ICD-10-CM | POA: Diagnosis not present

## 2015-01-26 DIAGNOSIS — N2581 Secondary hyperparathyroidism of renal origin: Secondary | ICD-10-CM | POA: Diagnosis not present

## 2015-01-26 DIAGNOSIS — Z992 Dependence on renal dialysis: Secondary | ICD-10-CM | POA: Diagnosis not present

## 2015-01-26 DIAGNOSIS — D509 Iron deficiency anemia, unspecified: Secondary | ICD-10-CM | POA: Diagnosis not present

## 2015-01-26 DIAGNOSIS — D631 Anemia in chronic kidney disease: Secondary | ICD-10-CM | POA: Diagnosis not present

## 2015-01-26 DIAGNOSIS — N186 End stage renal disease: Secondary | ICD-10-CM | POA: Diagnosis not present

## 2015-01-27 DIAGNOSIS — N186 End stage renal disease: Secondary | ICD-10-CM | POA: Diagnosis not present

## 2015-01-27 DIAGNOSIS — Z992 Dependence on renal dialysis: Secondary | ICD-10-CM | POA: Diagnosis not present

## 2015-01-28 DIAGNOSIS — Z992 Dependence on renal dialysis: Secondary | ICD-10-CM | POA: Diagnosis not present

## 2015-01-28 DIAGNOSIS — N186 End stage renal disease: Secondary | ICD-10-CM | POA: Diagnosis not present

## 2015-01-28 DIAGNOSIS — D509 Iron deficiency anemia, unspecified: Secondary | ICD-10-CM | POA: Diagnosis not present

## 2015-01-28 DIAGNOSIS — N2581 Secondary hyperparathyroidism of renal origin: Secondary | ICD-10-CM | POA: Diagnosis not present

## 2015-01-29 DIAGNOSIS — N186 End stage renal disease: Secondary | ICD-10-CM | POA: Diagnosis not present

## 2015-01-29 DIAGNOSIS — N2581 Secondary hyperparathyroidism of renal origin: Secondary | ICD-10-CM | POA: Diagnosis not present

## 2015-01-29 DIAGNOSIS — D509 Iron deficiency anemia, unspecified: Secondary | ICD-10-CM | POA: Diagnosis not present

## 2015-01-29 DIAGNOSIS — Z992 Dependence on renal dialysis: Secondary | ICD-10-CM | POA: Diagnosis not present

## 2015-01-30 DIAGNOSIS — N186 End stage renal disease: Secondary | ICD-10-CM | POA: Diagnosis not present

## 2015-01-30 DIAGNOSIS — Z992 Dependence on renal dialysis: Secondary | ICD-10-CM | POA: Diagnosis not present

## 2015-01-30 DIAGNOSIS — N2581 Secondary hyperparathyroidism of renal origin: Secondary | ICD-10-CM | POA: Diagnosis not present

## 2015-01-30 DIAGNOSIS — D509 Iron deficiency anemia, unspecified: Secondary | ICD-10-CM | POA: Diagnosis not present

## 2015-01-31 DIAGNOSIS — D509 Iron deficiency anemia, unspecified: Secondary | ICD-10-CM | POA: Diagnosis not present

## 2015-01-31 DIAGNOSIS — N2581 Secondary hyperparathyroidism of renal origin: Secondary | ICD-10-CM | POA: Diagnosis not present

## 2015-01-31 DIAGNOSIS — N186 End stage renal disease: Secondary | ICD-10-CM | POA: Diagnosis not present

## 2015-01-31 DIAGNOSIS — Z992 Dependence on renal dialysis: Secondary | ICD-10-CM | POA: Diagnosis not present

## 2015-02-01 DIAGNOSIS — N2581 Secondary hyperparathyroidism of renal origin: Secondary | ICD-10-CM | POA: Diagnosis not present

## 2015-02-01 DIAGNOSIS — D509 Iron deficiency anemia, unspecified: Secondary | ICD-10-CM | POA: Diagnosis not present

## 2015-02-01 DIAGNOSIS — N186 End stage renal disease: Secondary | ICD-10-CM | POA: Diagnosis not present

## 2015-02-01 DIAGNOSIS — Z992 Dependence on renal dialysis: Secondary | ICD-10-CM | POA: Diagnosis not present

## 2015-02-02 DIAGNOSIS — N186 End stage renal disease: Secondary | ICD-10-CM | POA: Diagnosis not present

## 2015-02-02 DIAGNOSIS — N2581 Secondary hyperparathyroidism of renal origin: Secondary | ICD-10-CM | POA: Diagnosis not present

## 2015-02-02 DIAGNOSIS — D509 Iron deficiency anemia, unspecified: Secondary | ICD-10-CM | POA: Diagnosis not present

## 2015-02-02 DIAGNOSIS — Z992 Dependence on renal dialysis: Secondary | ICD-10-CM | POA: Diagnosis not present

## 2015-02-03 DIAGNOSIS — N2581 Secondary hyperparathyroidism of renal origin: Secondary | ICD-10-CM | POA: Diagnosis not present

## 2015-02-03 DIAGNOSIS — D509 Iron deficiency anemia, unspecified: Secondary | ICD-10-CM | POA: Diagnosis not present

## 2015-02-03 DIAGNOSIS — N186 End stage renal disease: Secondary | ICD-10-CM | POA: Diagnosis not present

## 2015-02-03 DIAGNOSIS — Z992 Dependence on renal dialysis: Secondary | ICD-10-CM | POA: Diagnosis not present

## 2015-02-04 DIAGNOSIS — N186 End stage renal disease: Secondary | ICD-10-CM | POA: Diagnosis not present

## 2015-02-04 DIAGNOSIS — Z992 Dependence on renal dialysis: Secondary | ICD-10-CM | POA: Diagnosis not present

## 2015-02-04 DIAGNOSIS — D509 Iron deficiency anemia, unspecified: Secondary | ICD-10-CM | POA: Diagnosis not present

## 2015-02-04 DIAGNOSIS — N2581 Secondary hyperparathyroidism of renal origin: Secondary | ICD-10-CM | POA: Diagnosis not present

## 2015-02-05 DIAGNOSIS — N186 End stage renal disease: Secondary | ICD-10-CM | POA: Diagnosis not present

## 2015-02-05 DIAGNOSIS — N2581 Secondary hyperparathyroidism of renal origin: Secondary | ICD-10-CM | POA: Diagnosis not present

## 2015-02-05 DIAGNOSIS — Z992 Dependence on renal dialysis: Secondary | ICD-10-CM | POA: Diagnosis not present

## 2015-02-05 DIAGNOSIS — D509 Iron deficiency anemia, unspecified: Secondary | ICD-10-CM | POA: Diagnosis not present

## 2015-02-06 DIAGNOSIS — D509 Iron deficiency anemia, unspecified: Secondary | ICD-10-CM | POA: Diagnosis not present

## 2015-02-06 DIAGNOSIS — N2581 Secondary hyperparathyroidism of renal origin: Secondary | ICD-10-CM | POA: Diagnosis not present

## 2015-02-06 DIAGNOSIS — Z992 Dependence on renal dialysis: Secondary | ICD-10-CM | POA: Diagnosis not present

## 2015-02-06 DIAGNOSIS — N186 End stage renal disease: Secondary | ICD-10-CM | POA: Diagnosis not present

## 2015-02-07 DIAGNOSIS — D509 Iron deficiency anemia, unspecified: Secondary | ICD-10-CM | POA: Diagnosis not present

## 2015-02-07 DIAGNOSIS — N186 End stage renal disease: Secondary | ICD-10-CM | POA: Diagnosis not present

## 2015-02-07 DIAGNOSIS — Z992 Dependence on renal dialysis: Secondary | ICD-10-CM | POA: Diagnosis not present

## 2015-02-07 DIAGNOSIS — N2581 Secondary hyperparathyroidism of renal origin: Secondary | ICD-10-CM | POA: Diagnosis not present

## 2015-02-08 DIAGNOSIS — N2581 Secondary hyperparathyroidism of renal origin: Secondary | ICD-10-CM | POA: Diagnosis not present

## 2015-02-08 DIAGNOSIS — Z992 Dependence on renal dialysis: Secondary | ICD-10-CM | POA: Diagnosis not present

## 2015-02-08 DIAGNOSIS — D509 Iron deficiency anemia, unspecified: Secondary | ICD-10-CM | POA: Diagnosis not present

## 2015-02-08 DIAGNOSIS — N186 End stage renal disease: Secondary | ICD-10-CM | POA: Diagnosis not present

## 2015-02-09 DIAGNOSIS — D509 Iron deficiency anemia, unspecified: Secondary | ICD-10-CM | POA: Diagnosis not present

## 2015-02-09 DIAGNOSIS — Z992 Dependence on renal dialysis: Secondary | ICD-10-CM | POA: Diagnosis not present

## 2015-02-09 DIAGNOSIS — N2581 Secondary hyperparathyroidism of renal origin: Secondary | ICD-10-CM | POA: Diagnosis not present

## 2015-02-09 DIAGNOSIS — N186 End stage renal disease: Secondary | ICD-10-CM | POA: Diagnosis not present

## 2015-02-10 DIAGNOSIS — N186 End stage renal disease: Secondary | ICD-10-CM | POA: Diagnosis not present

## 2015-02-10 DIAGNOSIS — D509 Iron deficiency anemia, unspecified: Secondary | ICD-10-CM | POA: Diagnosis not present

## 2015-02-10 DIAGNOSIS — N2581 Secondary hyperparathyroidism of renal origin: Secondary | ICD-10-CM | POA: Diagnosis not present

## 2015-02-10 DIAGNOSIS — Z992 Dependence on renal dialysis: Secondary | ICD-10-CM | POA: Diagnosis not present

## 2015-02-11 DIAGNOSIS — D509 Iron deficiency anemia, unspecified: Secondary | ICD-10-CM | POA: Diagnosis not present

## 2015-02-11 DIAGNOSIS — N2581 Secondary hyperparathyroidism of renal origin: Secondary | ICD-10-CM | POA: Diagnosis not present

## 2015-02-11 DIAGNOSIS — Z992 Dependence on renal dialysis: Secondary | ICD-10-CM | POA: Diagnosis not present

## 2015-02-11 DIAGNOSIS — N186 End stage renal disease: Secondary | ICD-10-CM | POA: Diagnosis not present

## 2015-02-12 DIAGNOSIS — N186 End stage renal disease: Secondary | ICD-10-CM | POA: Diagnosis not present

## 2015-02-12 DIAGNOSIS — D509 Iron deficiency anemia, unspecified: Secondary | ICD-10-CM | POA: Diagnosis not present

## 2015-02-12 DIAGNOSIS — N2581 Secondary hyperparathyroidism of renal origin: Secondary | ICD-10-CM | POA: Diagnosis not present

## 2015-02-12 DIAGNOSIS — Z992 Dependence on renal dialysis: Secondary | ICD-10-CM | POA: Diagnosis not present

## 2015-02-13 DIAGNOSIS — N2581 Secondary hyperparathyroidism of renal origin: Secondary | ICD-10-CM | POA: Diagnosis not present

## 2015-02-13 DIAGNOSIS — D509 Iron deficiency anemia, unspecified: Secondary | ICD-10-CM | POA: Diagnosis not present

## 2015-02-13 DIAGNOSIS — N186 End stage renal disease: Secondary | ICD-10-CM | POA: Diagnosis not present

## 2015-02-13 DIAGNOSIS — Z992 Dependence on renal dialysis: Secondary | ICD-10-CM | POA: Diagnosis not present

## 2015-02-14 DIAGNOSIS — D509 Iron deficiency anemia, unspecified: Secondary | ICD-10-CM | POA: Diagnosis not present

## 2015-02-14 DIAGNOSIS — Z992 Dependence on renal dialysis: Secondary | ICD-10-CM | POA: Diagnosis not present

## 2015-02-14 DIAGNOSIS — N2581 Secondary hyperparathyroidism of renal origin: Secondary | ICD-10-CM | POA: Diagnosis not present

## 2015-02-14 DIAGNOSIS — N186 End stage renal disease: Secondary | ICD-10-CM | POA: Diagnosis not present

## 2015-02-15 DIAGNOSIS — D509 Iron deficiency anemia, unspecified: Secondary | ICD-10-CM | POA: Diagnosis not present

## 2015-02-15 DIAGNOSIS — N2581 Secondary hyperparathyroidism of renal origin: Secondary | ICD-10-CM | POA: Diagnosis not present

## 2015-02-15 DIAGNOSIS — Z992 Dependence on renal dialysis: Secondary | ICD-10-CM | POA: Diagnosis not present

## 2015-02-15 DIAGNOSIS — N186 End stage renal disease: Secondary | ICD-10-CM | POA: Diagnosis not present

## 2015-02-16 DIAGNOSIS — D509 Iron deficiency anemia, unspecified: Secondary | ICD-10-CM | POA: Diagnosis not present

## 2015-02-16 DIAGNOSIS — N2581 Secondary hyperparathyroidism of renal origin: Secondary | ICD-10-CM | POA: Diagnosis not present

## 2015-02-16 DIAGNOSIS — Z992 Dependence on renal dialysis: Secondary | ICD-10-CM | POA: Diagnosis not present

## 2015-02-16 DIAGNOSIS — N186 End stage renal disease: Secondary | ICD-10-CM | POA: Diagnosis not present

## 2015-02-17 DIAGNOSIS — D509 Iron deficiency anemia, unspecified: Secondary | ICD-10-CM | POA: Diagnosis not present

## 2015-02-17 DIAGNOSIS — N2581 Secondary hyperparathyroidism of renal origin: Secondary | ICD-10-CM | POA: Diagnosis not present

## 2015-02-17 DIAGNOSIS — Z992 Dependence on renal dialysis: Secondary | ICD-10-CM | POA: Diagnosis not present

## 2015-02-17 DIAGNOSIS — N186 End stage renal disease: Secondary | ICD-10-CM | POA: Diagnosis not present

## 2015-02-18 DIAGNOSIS — N2581 Secondary hyperparathyroidism of renal origin: Secondary | ICD-10-CM | POA: Diagnosis not present

## 2015-02-18 DIAGNOSIS — N186 End stage renal disease: Secondary | ICD-10-CM | POA: Diagnosis not present

## 2015-02-18 DIAGNOSIS — D509 Iron deficiency anemia, unspecified: Secondary | ICD-10-CM | POA: Diagnosis not present

## 2015-02-18 DIAGNOSIS — Z992 Dependence on renal dialysis: Secondary | ICD-10-CM | POA: Diagnosis not present

## 2015-02-19 DIAGNOSIS — N2581 Secondary hyperparathyroidism of renal origin: Secondary | ICD-10-CM | POA: Diagnosis not present

## 2015-02-19 DIAGNOSIS — Z992 Dependence on renal dialysis: Secondary | ICD-10-CM | POA: Diagnosis not present

## 2015-02-19 DIAGNOSIS — N186 End stage renal disease: Secondary | ICD-10-CM | POA: Diagnosis not present

## 2015-02-19 DIAGNOSIS — D509 Iron deficiency anemia, unspecified: Secondary | ICD-10-CM | POA: Diagnosis not present

## 2015-02-20 DIAGNOSIS — Z992 Dependence on renal dialysis: Secondary | ICD-10-CM | POA: Diagnosis not present

## 2015-02-20 DIAGNOSIS — D509 Iron deficiency anemia, unspecified: Secondary | ICD-10-CM | POA: Diagnosis not present

## 2015-02-20 DIAGNOSIS — N2581 Secondary hyperparathyroidism of renal origin: Secondary | ICD-10-CM | POA: Diagnosis not present

## 2015-02-20 DIAGNOSIS — N186 End stage renal disease: Secondary | ICD-10-CM | POA: Diagnosis not present

## 2015-02-21 ENCOUNTER — Other Ambulatory Visit: Payer: Self-pay | Admitting: Internal Medicine

## 2015-02-21 DIAGNOSIS — N186 End stage renal disease: Secondary | ICD-10-CM | POA: Diagnosis not present

## 2015-02-21 DIAGNOSIS — D509 Iron deficiency anemia, unspecified: Secondary | ICD-10-CM | POA: Diagnosis not present

## 2015-02-21 DIAGNOSIS — N2581 Secondary hyperparathyroidism of renal origin: Secondary | ICD-10-CM | POA: Diagnosis not present

## 2015-02-21 DIAGNOSIS — Z992 Dependence on renal dialysis: Secondary | ICD-10-CM | POA: Diagnosis not present

## 2015-02-22 DIAGNOSIS — N186 End stage renal disease: Secondary | ICD-10-CM | POA: Diagnosis not present

## 2015-02-22 DIAGNOSIS — D509 Iron deficiency anemia, unspecified: Secondary | ICD-10-CM | POA: Diagnosis not present

## 2015-02-22 DIAGNOSIS — Z992 Dependence on renal dialysis: Secondary | ICD-10-CM | POA: Diagnosis not present

## 2015-02-22 DIAGNOSIS — N2581 Secondary hyperparathyroidism of renal origin: Secondary | ICD-10-CM | POA: Diagnosis not present

## 2015-02-23 DIAGNOSIS — N186 End stage renal disease: Secondary | ICD-10-CM | POA: Diagnosis not present

## 2015-02-23 DIAGNOSIS — Z992 Dependence on renal dialysis: Secondary | ICD-10-CM | POA: Diagnosis not present

## 2015-02-23 DIAGNOSIS — N2581 Secondary hyperparathyroidism of renal origin: Secondary | ICD-10-CM | POA: Diagnosis not present

## 2015-02-23 DIAGNOSIS — D509 Iron deficiency anemia, unspecified: Secondary | ICD-10-CM | POA: Diagnosis not present

## 2015-02-24 DIAGNOSIS — Z992 Dependence on renal dialysis: Secondary | ICD-10-CM | POA: Diagnosis not present

## 2015-02-24 DIAGNOSIS — N186 End stage renal disease: Secondary | ICD-10-CM | POA: Diagnosis not present

## 2015-02-24 DIAGNOSIS — D509 Iron deficiency anemia, unspecified: Secondary | ICD-10-CM | POA: Diagnosis not present

## 2015-02-24 DIAGNOSIS — N2581 Secondary hyperparathyroidism of renal origin: Secondary | ICD-10-CM | POA: Diagnosis not present

## 2015-02-25 ENCOUNTER — Other Ambulatory Visit: Payer: Self-pay | Admitting: Internal Medicine

## 2015-02-25 DIAGNOSIS — N2581 Secondary hyperparathyroidism of renal origin: Secondary | ICD-10-CM | POA: Diagnosis not present

## 2015-02-25 DIAGNOSIS — N186 End stage renal disease: Secondary | ICD-10-CM | POA: Diagnosis not present

## 2015-02-25 DIAGNOSIS — Z992 Dependence on renal dialysis: Secondary | ICD-10-CM | POA: Diagnosis not present

## 2015-02-25 DIAGNOSIS — D509 Iron deficiency anemia, unspecified: Secondary | ICD-10-CM | POA: Diagnosis not present

## 2015-02-25 NOTE — Telephone Encounter (Signed)
Last OV 12/16

## 2015-02-26 DIAGNOSIS — N2581 Secondary hyperparathyroidism of renal origin: Secondary | ICD-10-CM | POA: Diagnosis not present

## 2015-02-26 DIAGNOSIS — D509 Iron deficiency anemia, unspecified: Secondary | ICD-10-CM | POA: Diagnosis not present

## 2015-02-26 DIAGNOSIS — Z992 Dependence on renal dialysis: Secondary | ICD-10-CM | POA: Diagnosis not present

## 2015-02-26 DIAGNOSIS — N186 End stage renal disease: Secondary | ICD-10-CM | POA: Diagnosis not present

## 2015-02-27 DIAGNOSIS — D509 Iron deficiency anemia, unspecified: Secondary | ICD-10-CM | POA: Diagnosis not present

## 2015-02-27 DIAGNOSIS — N186 End stage renal disease: Secondary | ICD-10-CM | POA: Diagnosis not present

## 2015-02-27 DIAGNOSIS — Z992 Dependence on renal dialysis: Secondary | ICD-10-CM | POA: Diagnosis not present

## 2015-03-27 DIAGNOSIS — N186 End stage renal disease: Secondary | ICD-10-CM | POA: Diagnosis not present

## 2015-03-27 DIAGNOSIS — Z992 Dependence on renal dialysis: Secondary | ICD-10-CM | POA: Diagnosis not present

## 2015-03-28 ENCOUNTER — Other Ambulatory Visit: Payer: Self-pay | Admitting: Internal Medicine

## 2015-03-28 DIAGNOSIS — D509 Iron deficiency anemia, unspecified: Secondary | ICD-10-CM | POA: Diagnosis not present

## 2015-03-28 DIAGNOSIS — Z992 Dependence on renal dialysis: Secondary | ICD-10-CM | POA: Diagnosis not present

## 2015-03-28 DIAGNOSIS — N186 End stage renal disease: Secondary | ICD-10-CM | POA: Diagnosis not present

## 2015-03-29 DIAGNOSIS — Z992 Dependence on renal dialysis: Secondary | ICD-10-CM | POA: Diagnosis not present

## 2015-03-29 DIAGNOSIS — D509 Iron deficiency anemia, unspecified: Secondary | ICD-10-CM | POA: Diagnosis not present

## 2015-03-29 DIAGNOSIS — N186 End stage renal disease: Secondary | ICD-10-CM | POA: Diagnosis not present

## 2015-04-01 DIAGNOSIS — N186 End stage renal disease: Secondary | ICD-10-CM | POA: Diagnosis not present

## 2015-04-01 DIAGNOSIS — Z992 Dependence on renal dialysis: Secondary | ICD-10-CM | POA: Diagnosis not present

## 2015-04-01 DIAGNOSIS — D509 Iron deficiency anemia, unspecified: Secondary | ICD-10-CM | POA: Diagnosis not present

## 2015-04-02 DIAGNOSIS — Z992 Dependence on renal dialysis: Secondary | ICD-10-CM | POA: Diagnosis not present

## 2015-04-02 DIAGNOSIS — N186 End stage renal disease: Secondary | ICD-10-CM | POA: Diagnosis not present

## 2015-04-02 DIAGNOSIS — D509 Iron deficiency anemia, unspecified: Secondary | ICD-10-CM | POA: Diagnosis not present

## 2015-04-04 DIAGNOSIS — D509 Iron deficiency anemia, unspecified: Secondary | ICD-10-CM | POA: Diagnosis not present

## 2015-04-04 DIAGNOSIS — Z992 Dependence on renal dialysis: Secondary | ICD-10-CM | POA: Diagnosis not present

## 2015-04-04 DIAGNOSIS — N186 End stage renal disease: Secondary | ICD-10-CM | POA: Diagnosis not present

## 2015-04-07 DIAGNOSIS — N186 End stage renal disease: Secondary | ICD-10-CM | POA: Diagnosis not present

## 2015-04-07 DIAGNOSIS — Z992 Dependence on renal dialysis: Secondary | ICD-10-CM | POA: Diagnosis not present

## 2015-04-07 DIAGNOSIS — D509 Iron deficiency anemia, unspecified: Secondary | ICD-10-CM | POA: Diagnosis not present

## 2015-04-08 DIAGNOSIS — Z992 Dependence on renal dialysis: Secondary | ICD-10-CM | POA: Diagnosis not present

## 2015-04-08 DIAGNOSIS — D509 Iron deficiency anemia, unspecified: Secondary | ICD-10-CM | POA: Diagnosis not present

## 2015-04-08 DIAGNOSIS — N186 End stage renal disease: Secondary | ICD-10-CM | POA: Diagnosis not present

## 2015-04-11 DIAGNOSIS — D509 Iron deficiency anemia, unspecified: Secondary | ICD-10-CM | POA: Diagnosis not present

## 2015-04-11 DIAGNOSIS — N186 End stage renal disease: Secondary | ICD-10-CM | POA: Diagnosis not present

## 2015-04-11 DIAGNOSIS — Z992 Dependence on renal dialysis: Secondary | ICD-10-CM | POA: Diagnosis not present

## 2015-04-12 DIAGNOSIS — N186 End stage renal disease: Secondary | ICD-10-CM | POA: Diagnosis not present

## 2015-04-12 DIAGNOSIS — D509 Iron deficiency anemia, unspecified: Secondary | ICD-10-CM | POA: Diagnosis not present

## 2015-04-12 DIAGNOSIS — Z992 Dependence on renal dialysis: Secondary | ICD-10-CM | POA: Diagnosis not present

## 2015-04-14 DIAGNOSIS — Z992 Dependence on renal dialysis: Secondary | ICD-10-CM | POA: Diagnosis not present

## 2015-04-14 DIAGNOSIS — N186 End stage renal disease: Secondary | ICD-10-CM | POA: Diagnosis not present

## 2015-04-14 DIAGNOSIS — D509 Iron deficiency anemia, unspecified: Secondary | ICD-10-CM | POA: Diagnosis not present

## 2015-04-15 DIAGNOSIS — D509 Iron deficiency anemia, unspecified: Secondary | ICD-10-CM | POA: Diagnosis not present

## 2015-04-15 DIAGNOSIS — Z992 Dependence on renal dialysis: Secondary | ICD-10-CM | POA: Diagnosis not present

## 2015-04-15 DIAGNOSIS — N186 End stage renal disease: Secondary | ICD-10-CM | POA: Diagnosis not present

## 2015-04-16 DIAGNOSIS — D509 Iron deficiency anemia, unspecified: Secondary | ICD-10-CM | POA: Diagnosis not present

## 2015-04-16 DIAGNOSIS — Z992 Dependence on renal dialysis: Secondary | ICD-10-CM | POA: Diagnosis not present

## 2015-04-16 DIAGNOSIS — N186 End stage renal disease: Secondary | ICD-10-CM | POA: Diagnosis not present

## 2015-04-17 ENCOUNTER — Ambulatory Visit: Payer: Medicare Other | Admitting: Internal Medicine

## 2015-04-19 DIAGNOSIS — D509 Iron deficiency anemia, unspecified: Secondary | ICD-10-CM | POA: Diagnosis not present

## 2015-04-19 DIAGNOSIS — N186 End stage renal disease: Secondary | ICD-10-CM | POA: Diagnosis not present

## 2015-04-19 DIAGNOSIS — Z992 Dependence on renal dialysis: Secondary | ICD-10-CM | POA: Diagnosis not present

## 2015-04-21 DIAGNOSIS — Z992 Dependence on renal dialysis: Secondary | ICD-10-CM | POA: Diagnosis not present

## 2015-04-21 DIAGNOSIS — N186 End stage renal disease: Secondary | ICD-10-CM | POA: Diagnosis not present

## 2015-04-21 DIAGNOSIS — D509 Iron deficiency anemia, unspecified: Secondary | ICD-10-CM | POA: Diagnosis not present

## 2015-04-22 ENCOUNTER — Encounter: Payer: Self-pay | Admitting: Internal Medicine

## 2015-04-22 ENCOUNTER — Ambulatory Visit (INDEPENDENT_AMBULATORY_CARE_PROVIDER_SITE_OTHER): Payer: Medicare Other | Admitting: Internal Medicine

## 2015-04-22 VITALS — BP 138/70 | HR 75 | Temp 98.0°F | Ht 64.0 in | Wt 116.1 lb

## 2015-04-22 DIAGNOSIS — R2689 Other abnormalities of gait and mobility: Secondary | ICD-10-CM

## 2015-04-22 DIAGNOSIS — F329 Major depressive disorder, single episode, unspecified: Secondary | ICD-10-CM | POA: Diagnosis not present

## 2015-04-22 DIAGNOSIS — D509 Iron deficiency anemia, unspecified: Secondary | ICD-10-CM | POA: Diagnosis not present

## 2015-04-22 DIAGNOSIS — Z992 Dependence on renal dialysis: Secondary | ICD-10-CM | POA: Diagnosis not present

## 2015-04-22 DIAGNOSIS — I10 Essential (primary) hypertension: Secondary | ICD-10-CM | POA: Diagnosis not present

## 2015-04-22 DIAGNOSIS — N185 Chronic kidney disease, stage 5: Secondary | ICD-10-CM | POA: Diagnosis not present

## 2015-04-22 DIAGNOSIS — F32A Depression, unspecified: Secondary | ICD-10-CM

## 2015-04-22 DIAGNOSIS — N186 End stage renal disease: Secondary | ICD-10-CM | POA: Diagnosis not present

## 2015-04-22 MED ORDER — MECLIZINE HCL 25 MG PO TABS: 25.0000 mg | ORAL_TABLET | Freq: Three times a day (TID) | ORAL | Status: DC | PRN

## 2015-04-22 NOTE — Patient Instructions (Addendum)
We will set up physical therapy to help with balance.  We will also start Meclizine as needed for dizziness.  Follow up in 4 weeks.

## 2015-04-22 NOTE — Assessment & Plan Note (Signed)
Continue PD with Dr. Holley Raring.

## 2015-04-22 NOTE — Assessment & Plan Note (Signed)
Symptoms stable on Sertraline. Will continue.

## 2015-04-22 NOTE — Progress Notes (Signed)
Subjective:    Patient ID: Sharon [name], female    DOB: 10/22/1926, 82 y.o.   MRN: [id number]  HPI  82YO female presents for follow up.  Balance is off. Has fallen a couple of times at home. Using Lavonne Cass with some improvement. Worried that it might be a result of coming off Triamterine. Sometimes, symptoms of vertigo, but not persistent.  Depression - Continues on Sertraline.Mood is not great but not bad. Feels content. Not interested in seeing a counselor at this point.  No longer driving. Great nephew is living with her right now. He has been helping her with ADLs. However, she reports he is a "slob" and drinks alcohol.  Continues to have diarrhea. Seen by Dr. Allen Norris about 1 year ago. Plans to follow up with him to see if any other options.   Wt Readings from Last 3 Encounters:  04/22/15 116 lb 2 oz (52.674 kg)  01/16/15 122 lb 4 oz (55.452 kg)  01/08/15 119 lb (53.978 kg)   BP Readings from Last 3 Encounters:  04/22/15 138/70  01/16/15 145/75  01/08/15 124/58    Past Medical History  Diagnosis Date  . Peritoneal dialysis status ([place])   . Meniere disease   . Meniere's disease   . Kidney failure July 2012  . Kidney failure    Family History  Problem Relation Age of Onset  . Cancer Mother     ? ovarian - sarcoma  . Cancer Father     Skin cancer  . Diabetes Sister   . COPD Sister   . Depression Sister   . Cancer Sister     Lung - 36 yrs old   Past Surgical History  Procedure Laterality Date  . Pacemaker insertion  2006  . Cholecystectomy  01/2008  . Total knee arthroplasty  2008    LEFT/Dr Calif  . Cataract extraction  2006, 2011  . Insertion of dialysis catheter  07/2010  . Abdominal adhesion surgery  1958   Social History   Social History  . Marital Status: Married    Spouse Name: N/A  . Number of Children: 0  . Years of Education: N/A   Occupational History  . Retired     Social History Main Topics  . Smoking status: Former Smoker    Quit  date: 03/31/1948  . Smokeless tobacco: Never Used  . Alcohol Use: No  . Drug Use: No  . Sexual Activity: Not Asked   Other Topics Concern  . None   Social History Narrative   Lives in [place] alone. Widow 2012.      Regular Exercise -  Housework   Daily Caffeine Use:  1 - 2 cups coffee             Review of Systems  Constitutional: Positive for fatigue. Negative for fever, chills, appetite change and unexpected weight change.  Eyes: Negative for visual disturbance.  Respiratory: Negative for shortness of breath.   Cardiovascular: Negative for chest pain and leg swelling.  Gastrointestinal: Positive for abdominal pain and diarrhea. Negative for nausea, vomiting, constipation and blood in stool.  Musculoskeletal: Positive for myalgias, back pain and arthralgias.  Skin: Negative for color change and rash.  Hematological: Negative for adenopathy. Does not bruise/bleed easily.  Psychiatric/Behavioral: Positive for sleep disturbance and dysphoric mood. The patient is not nervous/anxious.        Objective:    BP 138/70 mmHg  Pulse 75  Temp(Src) 98 F (36.7 C) (Oral)  Ht  5\' 4"  (1.626 m)  Wt 116 lb 2 oz (52.674 kg)  BMI 19.92 kg/m2  SpO2 97% Physical Exam  Constitutional: She is oriented to person, place, and time. She appears well-developed and well-nourished. No distress.  HENT:  Head: Normocephalic and atraumatic.  Right Ear: External ear normal.  Left Ear: External ear normal.  Nose: Nose normal.  Mouth/Throat: Oropharynx is clear and moist. No oropharyngeal exudate.  Eyes: Conjunctivae are normal. Pupils are equal, round, and reactive to light. Right eye exhibits no discharge. Left eye exhibits no discharge. No scleral icterus.  Neck: Normal range of motion. Neck supple. No tracheal deviation present. No thyromegaly present.  Cardiovascular: Normal rate, regular rhythm, normal heart sounds and intact distal pulses.  Exam reveals no gallop and no friction rub.   No  murmur heard. Pulmonary/Chest: Effort normal and breath sounds normal. No respiratory distress. She has no wheezes. She has no rales. She exhibits no tenderness.  Musculoskeletal: Normal range of motion. She exhibits no edema or tenderness.  Lymphadenopathy:    She has no cervical adenopathy.  Neurological: She is alert and oriented to person, place, and time. No cranial nerve deficit. She exhibits normal muscle tone. Coordination normal.  Skin: Skin is warm and dry. No rash noted. She is not diaphoretic. No erythema. No pallor.  Psychiatric: She has a normal mood and affect. Her behavior is normal. Judgment and thought content normal.          Assessment & Plan:   Problem List Items Addressed This Visit      Unprioritized   Chronic kidney disease, stage 5, kidney failure ([place])    Continue PD with Dr. Holley Raring.      Depression    Symptoms stable on Sertraline. Will continue.      Hypertension    BP Readings from Last 3 Encounters:  04/22/15 138/70  01/16/15 145/75  01/08/15 124/58   BP well controlled. Continue current medication.      Imbalance - Primary    Generalized unsteadiness. Will set up PT for balance training.      Relevant Medications   meclizine (ANTIVERT) 25 MG tablet   Other Relevant Orders   Ambulatory referral to Physical Therapy       Return in about 4 weeks (around 05/20/2015) for Recheck.  Ronette Deter, MD Internal Medicine [facility]

## 2015-04-22 NOTE — Assessment & Plan Note (Signed)
BP Readings from Last 3 Encounters:  04/22/15 138/70  01/16/15 145/75  01/08/15 124/58   BP well controlled. Continue current medication.

## 2015-04-22 NOTE — Assessment & Plan Note (Signed)
Generalized unsteadiness. Will set up PT for balance training.

## 2015-04-22 NOTE — Progress Notes (Signed)
Pre visit review using our clinic review tool, if applicable. No additional management support is needed unless otherwise documented below in the visit note. 

## 2015-04-23 DIAGNOSIS — Z992 Dependence on renal dialysis: Secondary | ICD-10-CM | POA: Diagnosis not present

## 2015-04-23 DIAGNOSIS — D509 Iron deficiency anemia, unspecified: Secondary | ICD-10-CM | POA: Diagnosis not present

## 2015-04-23 DIAGNOSIS — N186 End stage renal disease: Secondary | ICD-10-CM | POA: Diagnosis not present

## 2015-04-25 DIAGNOSIS — D509 Iron deficiency anemia, unspecified: Secondary | ICD-10-CM | POA: Diagnosis not present

## 2015-04-25 DIAGNOSIS — N186 End stage renal disease: Secondary | ICD-10-CM | POA: Diagnosis not present

## 2015-04-25 DIAGNOSIS — Z992 Dependence on renal dialysis: Secondary | ICD-10-CM | POA: Diagnosis not present

## 2015-04-26 DIAGNOSIS — N186 End stage renal disease: Secondary | ICD-10-CM | POA: Diagnosis not present

## 2015-04-26 DIAGNOSIS — Z992 Dependence on renal dialysis: Secondary | ICD-10-CM | POA: Diagnosis not present

## 2015-04-26 DIAGNOSIS — D509 Iron deficiency anemia, unspecified: Secondary | ICD-10-CM | POA: Diagnosis not present

## 2015-04-27 DIAGNOSIS — N186 End stage renal disease: Secondary | ICD-10-CM | POA: Diagnosis not present

## 2015-04-27 DIAGNOSIS — Z992 Dependence on renal dialysis: Secondary | ICD-10-CM | POA: Diagnosis not present

## 2015-04-29 DIAGNOSIS — N186 End stage renal disease: Secondary | ICD-10-CM | POA: Diagnosis not present

## 2015-04-29 DIAGNOSIS — Z992 Dependence on renal dialysis: Secondary | ICD-10-CM | POA: Diagnosis not present

## 2015-04-30 ENCOUNTER — Ambulatory Visit: Payer: Medicare Other | Attending: Internal Medicine

## 2015-04-30 DIAGNOSIS — M549 Dorsalgia, unspecified: Secondary | ICD-10-CM | POA: Diagnosis not present

## 2015-04-30 DIAGNOSIS — N186 End stage renal disease: Secondary | ICD-10-CM | POA: Diagnosis not present

## 2015-04-30 DIAGNOSIS — M6281 Muscle weakness (generalized): Secondary | ICD-10-CM

## 2015-04-30 DIAGNOSIS — R2689 Other abnormalities of gait and mobility: Secondary | ICD-10-CM

## 2015-04-30 DIAGNOSIS — M545 Low back pain: Secondary | ICD-10-CM

## 2015-04-30 DIAGNOSIS — Z992 Dependence on renal dialysis: Secondary | ICD-10-CM | POA: Diagnosis not present

## 2015-04-30 NOTE — Therapy (Signed)
[facility] [address] [place], [address] Phone: [phone]   Fax:  [phone]  Physical Therapy Evaluation  Patient Details  [name] MRN: [id number] Date of Birth: 12/31/1926 Referring Provider: Ronette Deter, MD  Encounter Date: 04/30/2015      PT End of Session - 04/30/15 1522    Visit Number 1   Number of Visits 9   Date for PT Re-Evaluation 05/30/15   Authorization Type 1   Authorization Time Period of 10   PT Start Time 1522   PT Stop Time 1644   PT Time Calculation (min) 82 min   Activity Tolerance Patient tolerated treatment well   Behavior During Therapy [facility] for tasks assessed/performed      Past Medical History  Diagnosis Date  . Peritoneal dialysis status ([place])   . Meniere disease   . Meniere's disease   . Kidney failure July 2012  . Kidney failure     Past Surgical History  Procedure Laterality Date  . Pacemaker insertion  2006  . Cholecystectomy  01/2008  . Total knee arthroplasty  2008    LEFT/Dr Calif  . Cataract extraction  2006, 2011  . Insertion of dialysis catheter  07/2010  . Abdominal adhesion surgery  1958    There were no vitals filed for this visit.  Visit Diagnosis:  Imbalance - Plan: PT plan of care cert/re-cert  Muscle weakness (generalized) - Plan: PT plan of care cert/re-cert  Bilateral low back pain, with sciatica presence unspecified - Plan: PT plan of care cert/re-cert  Mid back pain - Plan: PT plan of care cert/re-cert      Subjective Assessment - 04/30/15 1524    Subjective Back Pain: 4-5/10 current (pt sitting for a while), 8/10 at worst ( such as after vacumming, and sleeping at night). Heat and tramadol helps with her back. Neck pain: 3-4/10 current (pt sitting) and worst. Lifting, pulling bothers her neck.    Pertinent History Pt states having Menire's disease gives her balance problems. Has been in remission for a while unless  she moves and turns her head back. Was diagnosed in the 1970's. No complain of dizziness or the room spinning. Pt states that the falls that she had were due to balance. One time she was sitting, turned to the L to check her catheter and  continued falling. Pt states that she could not stop. Pt states having a bad fall 3 months ago and bruised her chest.  Took a while to recover. Pt also states concern about losing weight  (both Dr. Gilford Rile and Dr Holley Raring knows about her weight loss). Pt state her apetite is not good but makes herself eat. Pt states that she still is losing weight.   Currenlty is about 111 lbs.   Pt states being taken off triamterene for Menier's diseaes for over a year because of its adverse affects of her kidney. Pt lives alone. Pt states having some neck pain for 40 years.  Had 3 bad falls within the past 2 months.  Pt sates falling 3 times all at the same place in her bed room where she hooks herself up for her dialysis. Pt stood up off her bed but kept going forward, resulting in a fall. Could not stop herself. Another time she fell, rolling  off her bed trying to get her glasses. Other time she fell, she was sitting on a chair, turned to the L, leaned forward,  and could not get back up. Pt continued falling. Pt does not remember if she was dizzy, or if the room was spinning.    Patient Stated Goals "I would like to be able to function as long as I can to take care of myself."   Currently in Pain? Yes   Pain Score 5   please see subjective   Pain Location Back   Aggravating Factors  bending forward   Multiple Pain Sites Yes            [facility] PT Assessment - 04/30/15 1552    Assessment   Medical Diagnosis Imbalance   Referring Provider Ronette Deter, MD   Onset Date/Surgical Date 04/22/15  Date referral signed   Prior Therapy No known physical therapy for current condition   Precautions   Precaution Comments Pacemaker L chest. Catheter L abdomen, fall risk   Restrictions    Other Position/Activity Restrictions No known restrictions   Balance Screen   Has the patient fallen in the past 6 months Yes   How many times? at least 3   Has the patient had a decrease in activity level because of a fear of falling?  No  Pt however states fear of falling.    Is the patient reluctant to leave their home because of a fear of falling?  No  Pt however states fear of falling.    Prior Function   Vocation Retired   Biomedical scientist PLOF: pt able to perform her daily tasks with less falls.    Observation/Other Assessments   Observations LOB backwards when eyes closed. Possible vestibular related LOB. LOB when turning to the L (pt pivoting on L LE) possibly due to LE weakness. Mid and low back pain when bending over to pick up an object from the floor, LOB and needed help to get back up due to back pain and weakness.     Strength   Overall Strength Comments Pt adds that when she bends over, her back bothers her. When she turns in her bed, she gets dizzy. Pt however states that she does not think the Menier's caused her falls. Dizziness when getting up from the prone position.    Right Hip Flexion 4-/5   Right Hip Extension 4-/5   Right Hip External Rotation  4-/5   Right Hip Internal Rotation 3+/5   Right Hip ABduction 4/5   Left Hip Flexion 4/5   Left Hip Extension 3+/5  with low back pain   Left Hip External Rotation 4-/5   Left Hip Internal Rotation 4-/5   Left Hip ABduction 4-/5   Right Knee Flexion 4+/5   Right Knee Extension 4+/5   Left Knee Flexion 4+/5   Left Knee Extension 4+/5   Ambulation/Gait   Gait Comments decreased stance L LE, antalgic, ambulates with rollator, forward flexed, decreased hip extension   Berg Balance Test   Sit to Stand Able to stand  independently using hands   Standing Unsupported Able to stand safely 2 minutes   Sitting with Back Unsupported but Feet Supported on Floor or Stool Able to sit safely and securely 2 minutes   Stand to  Sit Sits safely with minimal use of hands   Transfers Able to transfer safely, minor use of hands   Standing Unsupported with Eyes Closed Needs help to keep from falling   Standing Ubsupported with Feet Together Needs help to attain position and unable to hold for 15 seconds   From Standing, Reach  Forward with Outstretched Arm Can reach forward >5 cm safely (2")   From Standing Position, Pick up Object from Floor Unable to try/needs assist to keep balance   From Standing Position, Turn to Look Behind Over each Shoulder Looks behind one side only/other side shows less weight shift   Turn 360 Degrees Needs assistance while turning   Standing Unsupported, Alternately Place Feet on Step/Stool Needs assistance to keep from falling or unable to try   Standing Unsupported, One Foot in [place] to take small step independently and hold 30 seconds   Standing on One Leg Unable to try or needs assist to prevent fall   Total Score 26                  PT Education - 04/30/15 2044    Education provided Yes   Education Details plan of care   Person(s) Educated Patient   Methods Explanation   Comprehension Verbalized understanding             PT Long Term Goals - 04/30/15 2053    PT LONG TERM GOAL #1   Title Patient will improve bilateral LE strength by at least 1/2 MMT grade to promote balance   Time 4   Period Weeks   Status New   PT LONG TERM GOAL #2   Title Patient will improve her BERG balance score by at least 10 points as a demonstration of improved balance.   Baseline 26/56   Time 4   Period Weeks   Status New   PT LONG TERM GOAL #3   Title Patient will be able to pick up an item from the ground independently and with minimal to no complain of back pain to promote balance and function at home.    Baseline Back pain when getting up after picking up an item from the floor, unsteady, and needed help.   Time 4   Period Weeks   Status New               Plan -  04/30/15 2044    Clinical Impression Statement Patient is an 82 year old female who came to phyiscal therapy secondary to falls, and decreased balance. She also presents with bilateral LE weakness, altered gait pattern, back and neck pain, a Berg Balance score of 26/56 suggesting increased fall risk and need for AD, and difficulty performing her daily tasks secondary to falls. Patient will benefit from skilled physical therapy intervention to address the aforementioned deficits.  Pt also demonstrates some vestibular related issues such as losing balance when her eyes were closed as well as some dizziness when turning on the mat table and getting up from the prone position. Pt however wants to try working on LE strengthening prior to trying vestibular related physical therapy.    Pt will benefit from skilled therapeutic intervention in order to improve on the following deficits Decreased strength;Dizziness;Decreased balance;Pain   Rehab Potential Fair   Clinical Impairments Affecting Rehab Potential Menier's disease   PT Frequency 2x / week   PT Duration 4 weeks   PT Treatment/Interventions Therapeutic activities;Therapeutic exercise;Manual techniques;Canalith Repostioning;Balance training;Neuromuscular re-education   PT Next Visit Plan LE strengthening, balance   Consulted and Agree with Plan of Care Patient    Pt states wanting to try working on improving LE strength for balance prior to trying anything inner ear related because she did not want to flare up her Menier's condition.       G-Codes -  04/30/15 2103    Functional Assessment Tool Used Berg Balance Test, clinical presentation, patient interview   Functional Limitation Mobility: Walking and moving around   Mobility: Walking and Moving Around Current Status [phone]) At least 40 percent but less than 60 percent impaired, limited or restricted   Mobility: Walking and Moving Around Goal Status [phone]) At least 20 percent but less than 40  percent impaired, limited or restricted       Problem List Patient Active Problem List   Diagnosis Date Noted  . Imbalance 04/22/2015  . Electrical shock sensation in right posterior head 11/22/2014  . Fall at home 11/15/2014  . Expressive aphasia 10/04/2014  . Chronic pain syndrome 05/22/2014  . Insomnia 03/13/2014  . Anxiety state 03/13/2014  . Basal cell carcinoma of neck 11/14/2013  . Pelvic pain in female 09/12/2013  . Neck pain of over 3 months duration 09/12/2013  . Memory loss 09/12/2013  . Systolic heart failure, chronic ([place]) 05/12/2013  . Medicare annual wellness visit, subsequent 11/10/2012  . Sinus node dysfunction ([place]) 08/01/2012  . Low back pain 08/01/2012  . Cervical spine pain 05/02/2012  . Irritable bowel syndrome 11/02/2011  . Depression 04/01/2011  . Hypertension 04/01/2011  . Menopausal disorder 04/01/2011  . Screening for breast cancer 04/01/2011  . Chronic kidney disease, stage 5, kidney failure ([place]) 04/01/2011    Thank you for your referral.   Joneen Boers PT, DPT   04/30/2015, 9:11 PM  [facility] [address], [place], [address] Phone: [phone]   Fax:  [phone]  [name] MRN: [id number] Date of Birth: 03-15-1926

## 2015-05-02 ENCOUNTER — Ambulatory Visit: Payer: Medicare Other

## 2015-05-02 DIAGNOSIS — M545 Low back pain: Secondary | ICD-10-CM | POA: Diagnosis not present

## 2015-05-02 DIAGNOSIS — M549 Dorsalgia, unspecified: Secondary | ICD-10-CM | POA: Diagnosis not present

## 2015-05-02 DIAGNOSIS — M6281 Muscle weakness (generalized): Secondary | ICD-10-CM

## 2015-05-02 DIAGNOSIS — R2689 Other abnormalities of gait and mobility: Secondary | ICD-10-CM | POA: Diagnosis not present

## 2015-05-02 DIAGNOSIS — N186 End stage renal disease: Secondary | ICD-10-CM | POA: Diagnosis not present

## 2015-05-02 DIAGNOSIS — Z992 Dependence on renal dialysis: Secondary | ICD-10-CM | POA: Diagnosis not present

## 2015-05-02 NOTE — Therapy (Signed)
[facility] [address] [place], [address] Phone: [phone]   Fax:  [phone]  Physical Therapy Treatment  Patient Details  [name] MRN: [id number] Date of Birth: Feb 24, 1926 Referring Provider: Ronette Deter, MD  Encounter Date: 05/02/2015      PT End of Session - 05/02/15 1344    Visit Number 2   Number of Visits 9   Date for PT Re-Evaluation 05/30/15   Authorization Type 2   Authorization Time Period of 10   PT Start Time 1344   PT Stop Time 1430   PT Time Calculation (min) 46 min   Activity Tolerance Patient tolerated treatment well   Behavior During Therapy [facility] for tasks assessed/performed      Past Medical History  Diagnosis Date  . Peritoneal dialysis status ([place])   . Meniere disease   . Meniere's disease   . Kidney failure July 2012  . Kidney failure     Past Surgical History  Procedure Laterality Date  . Pacemaker insertion  2006  . Cholecystectomy  01/2008  . Total knee arthroplasty  2008    LEFT/Dr Calif  . Cataract extraction  2006, 2011  . Insertion of dialysis catheter  07/2010  . Abdominal adhesion surgery  1958    There were no vitals filed for this visit.  Visit Diagnosis:  Imbalance  Muscle weakness (generalized)  Bilateral low back pain, with sciatica presence unspecified  Mid back pain      Subjective Assessment - 05/02/15 1346    Subjective Pt states doing ok. Had a better morning yesterday compared to a while ago. I hurt somewhere all the time (holding bilateral shoulders)    Pertinent History Pt states having Menire's disease gives her balance problems. Has been in remission for a while unless she moves and turns her head back. Was diagnosed in the 1970's. No complain of dizziness or the room spinning. Pt states that the falls that she had were due to balance. One time she was sitting, turned to the L to check her catheter and  continued  falling. Pt states that she could not stop. Pt states having a bad fall 3 months ago and bruised her chest.  Took a while to recover. Pt also states concern about losing weight  (both Dr. Gilford Rile and Dr Holley Raring knows about her weight loss). Pt state her apetite is not good but makes herself eat. Pt states that she still is losing weight.   Currenlty is about 111 lbs.   Pt states being taken off triamterene for Menier's diseaes for over a year because of its adverse affects of her kidney. Pt lives alone. Pt states having some neck pain for 40 years.  Had 3 bad falls within the past 2 months.  Pt sates falling 3 times all at the same place in her bed room where she hooks herself up for her dialysis. Pt stood up off her bed but kept going forward, resulting in a fall. Could not stop herself. Another time she fell, rolling  off her bed trying to get her glasses. Other time she fell, she was sitting on a chair, turned to the L, leaned forward, and could not get back up. Pt continued falling. Pt does not remember if she was dizzy, or if the room was spinning.    Patient Stated Goals "I would like to be able to function as long as I can to take care of  myself."   Currently in Pain? Yes   Pain Score --  no pain level provided   Pain Location --  back, bilateral shoulders        Objectives  There-ex  Directed patient with sit <> stand from mat table slightly higher than a regular chair 6x, then 5x2 with bilateral UE to no UE assist. LOB x 1 to the L  SLS with bilateral UE assist with opposite tip toe assist 10x5 seconds each LE for 2 sets for glute med muscle use  S/L clam shells 2x5 with 5 second holds R LE   2x5 with 5 seconds L LE   Given and reviewed as part of her HEP. Pt demonstrated and verbalized understanding  Seated bilateral scapular retraction resisting red band 5x3 (pt states she squeezes her shoulders together to help with her back)  Sitting with proper posture: gentle manual  perturbation from PT 3x to promote trunk strength to keep her from falling forward (such as when she is sitting and leaning forward or to the side)   Improved exercise technique, movement at target joints, use of target muscles after mod verbal, visual, tactile cues.     Pt tolerated session well without aggravation of back or shoulder discomfort. LOB x1 only to the L with sit <> stand. No LOB rest of session. Continue working on LE, trunk, and scapular strength to promote balance and decrease fall risk, and decrease back/shoulder pain.                          PT Education - 05/02/15 1350    Education provided Yes   Education Details ther-ex, HEP   Person(s) Educated Patient   Methods Explanation;Demonstration;Tactile cues;Verbal cues;Handout   Comprehension Verbalized understanding;Returned demonstration             PT Long Term Goals - 04/30/15 2053    PT LONG TERM GOAL #1   Title Patient will improve bilateral LE strength by at least 1/2 MMT grade to promote balance   Time 4   Period Weeks   Status New   PT LONG TERM GOAL #2   Title Patient will improve her BERG balance score by at least 10 points as a demonstration of improved balance.   Baseline 26/56   Time 4   Period Weeks   Status New   PT LONG TERM GOAL #3   Title Patient will be able to pick up an item from the ground independently and with minimal to no complain of back pain to promote balance and function at home.    Baseline Back pain when getting up after picking up an item from the floor, unsteady, and needed help.   Time 4   Period Weeks   Status New               Plan - 05/02/15 1353    Clinical Impression Statement Pt tolerated session well without aggravation of back or shoulder discomfort. LOB x1 only to the L with sit <> stand. No LOB rest of session. Continue working on LE, trunk, and scapular strength to promote balance and decrease fall risk, and decrease back/shoulder  pain.    Pt will benefit from skilled therapeutic intervention in order to improve on the following deficits Decreased strength;Dizziness;Decreased balance;Pain   Rehab Potential Fair   Clinical Impairments Affecting Rehab Potential Menier's disease   PT Frequency 2x / week   PT Duration 4 weeks  PT Treatment/Interventions Therapeutic activities;Therapeutic exercise;Manual techniques;Canalith Repostioning;Balance training;Neuromuscular re-education   PT Next Visit Plan LE strengthening, balance   Consulted and Agree with Plan of Care Patient        Problem List Patient Active Problem List   Diagnosis Date Noted  . Imbalance 04/22/2015  . Electrical shock sensation in right posterior head 11/22/2014  . Fall at home 11/15/2014  . Expressive aphasia 10/04/2014  . Chronic pain syndrome 05/22/2014  . Insomnia 03/13/2014  . Anxiety state 03/13/2014  . Basal cell carcinoma of neck 11/14/2013  . Pelvic pain in female 09/12/2013  . Neck pain of over 3 months duration 09/12/2013  . Memory loss 09/12/2013  . Systolic heart failure, chronic ([place]) 05/12/2013  . Medicare annual wellness visit, subsequent 11/10/2012  . Sinus node dysfunction ([place]) 08/01/2012  . Low back pain 08/01/2012  . Cervical spine pain 05/02/2012  . Irritable bowel syndrome 11/02/2011  . Depression 04/01/2011  . Hypertension 04/01/2011  . Menopausal disorder 04/01/2011  . Screening for breast cancer 04/01/2011  . Chronic kidney disease, stage 5, kidney failure ([place]) 04/01/2011    Joneen Boers PT, DPT   05/02/2015, 3:44 PM  [facility] [address] [place], [address] Phone: [phone]   Fax:  [phone]  Name: JANEEKA PUHL MRN: [id number] Date of Birth: 10-01-1926

## 2015-05-02 NOTE — Patient Instructions (Addendum)
Clam Shell 45 Degrees: Leg Lift    Lying with hips and knees bent one pillow between knees and ankles. Rotate knee upward. Hold for 5 seconds. Lower foot then knee. Be sure pelvis does not roll backward. Do not arch back. Do __5_ times, each leg, _2__ times per day.   http://ss.exer.us/77   Copyright  VHI. All rights reserved.

## 2015-05-03 DIAGNOSIS — N186 End stage renal disease: Secondary | ICD-10-CM | POA: Diagnosis not present

## 2015-05-03 DIAGNOSIS — Z992 Dependence on renal dialysis: Secondary | ICD-10-CM | POA: Diagnosis not present

## 2015-05-05 DIAGNOSIS — Z992 Dependence on renal dialysis: Secondary | ICD-10-CM | POA: Diagnosis not present

## 2015-05-05 DIAGNOSIS — N186 End stage renal disease: Secondary | ICD-10-CM | POA: Diagnosis not present

## 2015-05-06 DIAGNOSIS — N186 End stage renal disease: Secondary | ICD-10-CM | POA: Diagnosis not present

## 2015-05-06 DIAGNOSIS — Z992 Dependence on renal dialysis: Secondary | ICD-10-CM | POA: Diagnosis not present

## 2015-05-07 ENCOUNTER — Ambulatory Visit: Payer: Medicare Other

## 2015-05-07 VITALS — BP 131/52 | HR 64

## 2015-05-07 DIAGNOSIS — M6281 Muscle weakness (generalized): Secondary | ICD-10-CM

## 2015-05-07 DIAGNOSIS — Z992 Dependence on renal dialysis: Secondary | ICD-10-CM | POA: Diagnosis not present

## 2015-05-07 DIAGNOSIS — M549 Dorsalgia, unspecified: Secondary | ICD-10-CM | POA: Diagnosis not present

## 2015-05-07 DIAGNOSIS — N186 End stage renal disease: Secondary | ICD-10-CM | POA: Diagnosis not present

## 2015-05-07 DIAGNOSIS — M545 Low back pain: Secondary | ICD-10-CM | POA: Diagnosis not present

## 2015-05-07 DIAGNOSIS — R2689 Other abnormalities of gait and mobility: Secondary | ICD-10-CM | POA: Diagnosis not present

## 2015-05-07 NOTE — Therapy (Signed)
[facility] [address], [place], [address] Phone: [phone]   Fax:  [phone]  Physical Therapy Treatment  Patient Details  [name] MRN: [id number] Date of Birth: May 28, 1926 Referring Provider: Ronette Deter, MD  Encounter Date: 05/07/2015      PT End of Session - 05/07/15 1517    Visit Number 3   Number of Visits 9   Date for PT Re-Evaluation 05/30/15   Authorization Type 3   Authorization Time Period of 10   PT Start Time 1517   PT Stop Time 1601   PT Time Calculation (min) 44 min   Activity Tolerance Patient tolerated treatment well   Behavior During Therapy [facility] for tasks assessed/performed      Past Medical History  Diagnosis Date  . Peritoneal dialysis status ([place])   . Meniere disease   . Meniere's disease   . Kidney failure July 2012  . Kidney failure     Past Surgical History  Procedure Laterality Date  . Pacemaker insertion  2006  . Cholecystectomy  01/2008  . Total knee arthroplasty  2008    LEFT/Dr Calif  . Cataract extraction  2006, 2011  . Insertion of dialysis catheter  07/2010  . Abdominal adhesion surgery  1958    Filed Vitals:   05/07/15 1525  BP: 131/52  Pulse: 64  SpO2: 99%        Subjective Assessment - 05/07/15 1520    Subjective Pt states not feeling well because she was not able to sleep well yesterday. Does not feel like she has strength or energy. Feels like she had a little spell with her heart yesterday. Feels short of breath. Pt also states that both feet were swollen yesterday but better today. Pt states that her pace maker is supposed to keep her HR above 60 bpm. My pulse rate is always around the high side. Doctors know about that.    Pertinent History Pt states having Menire's disease gives her balance problems. Has been in remission for a while unless she moves and turns her head back. Was diagnosed in the 1970's. No complain of  dizziness or the room spinning. Pt states that the falls that she had were due to balance. One time she was sitting, turned to the L to check her catheter and  continued falling. Pt states that she could not stop. Pt states having a bad fall 3 months ago and bruised her chest.  Took a while to recover. Pt also states concern about losing weight  (both Dr. Gilford Rile and Dr Holley Raring knows about her weight loss). Pt state her apetite is not good but makes herself eat. Pt states that she still is losing weight.   Currenlty is about 111 lbs.   Pt states being taken off triamterene for Menier's diseaes for over a year because of its adverse affects of her kidney. Pt lives alone. Pt states having some neck pain for 40 years.  Had 3 bad falls within the past 2 months.  Pt sates falling 3 times all at the same place in her bed room where she hooks herself up for her dialysis. Pt stood up off her bed but kept going forward, resulting in a fall. Could not stop herself. Another time she fell, rolling  off her bed trying to get her glasses. Other time she fell, she was sitting on a chair, turned to the L, leaned forward, and could not get  back up. Pt continued falling. Pt does not remember if she was dizzy, or if the room was spinning.    Patient Stated Goals "I would like to be able to function as long as I can to take care of myself."   Currently in Pain? Other (Comment)  No pain mentioned    Pt also states that both feet were swollen yesterday but better today. Pt states that her pace maker is supposed to keep her HR above 60 bpm. My pulse rate is always around the high side. Doctors know about that.      Objectives  There-ex   Directed patient with sitting with proper posture resisting gentle manual perturbation from PT 3x all directions to promote trunk strength to keep her from falling forward (such as when she is sitting and leaning forward or to the side)   Seated bilateral scapular retraction resisting red band  5x2 with 5 second holds  Seated manually resisted hip extension 5x3 each LE (SpO2 97%, 64 bpm)  Seated manually resisted clam shell (hips less than 90 degrees flexion) 5x5 seconds for 3 sets  Sit <> stand from elevated mat table 6x with cues for bringing center of gravity over base of support ("nose over toes" SpO2 98%, 69 bpm to 65 bpm)  Rest breaks provided. Vitals monitored.    Light exercises performed today secondary to pt stating not feeling well.    Improved exercise technique, movement at target joints, use of target muscles after mod verbal, visual, tactile cues.      Good tolerance to therapy. Patient still demonstrates bilateral LE weakness, back discomfort, as well as difficulty with balance and would benefit from continued skilled physical therapy services to promote strength, decrease fall risk and improve ability to perform functional tasks.                   PT Education - 05/07/15 1610    Education provided Yes   Education Details ther-ex   [employer]) Educated Patient   Methods Explanation;Demonstration;Tactile cues;Verbal cues   Comprehension Verbalized understanding;Returned demonstration             PT Long Term Goals - 05/07/15 1623    PT LONG TERM GOAL #1   Title Patient will improve bilateral LE strength by at least 1/2 MMT grade to promote balance   Time 4   Period Weeks   Status On-going   PT LONG TERM GOAL #2   Title Patient will improve her BERG balance score by at least 10 points as a demonstration of improved balance.   Baseline 26/56   Time 4   Period Weeks   Status On-going   PT LONG TERM GOAL #3   Title Patient will be able to pick up an item from the ground independently and with minimal to no complain of back pain to promote balance and function at home.    Baseline Back pain when getting up after picking up an item from the floor, unsteady, and needed help.   Time 4   Period Weeks   Status On-going                Plan - 05/07/15 1529    Clinical Impression Statement Good tolerance to therapy. Patient still demonstrates bilateral LE weakness, back discomfort, as well as difficulty with balance and would benefit from continued skilled physical therapy services to promote strength, decrease fall risk and improve ability to perform functional tasks.    Rehab Potential  Fair   Clinical Impairments Affecting Rehab Potential Menier's disease   PT Frequency 2x / week   PT Duration 4 weeks   PT Treatment/Interventions Therapeutic activities;Therapeutic exercise;Manual techniques;Canalith Repostioning;Balance training;Neuromuscular re-education   PT Next Visit Plan LE strengthening, balance   Consulted and Agree with Plan of Care Patient      Patient will benefit from skilled therapeutic intervention in order to improve the following deficits and impairments:  Decreased strength, Dizziness, Decreased balance, Pain  Visit Diagnosis: Muscle weakness (generalized) - Plan: PT plan of care cert/re-cert  Bilateral low back pain, with sciatica presence unspecified - Plan: PT plan of care cert/re-cert     Problem List Patient Active Problem List   Diagnosis Date Noted  . Imbalance 04/22/2015  . Electrical shock sensation in right posterior head 11/22/2014  . Fall at home 11/15/2014  . Expressive aphasia 10/04/2014  . Chronic pain syndrome 05/22/2014  . Insomnia 03/13/2014  . Anxiety state 03/13/2014  . Basal cell carcinoma of neck 11/14/2013  . Pelvic pain in female 09/12/2013  . Neck pain of over 3 months duration 09/12/2013  . Memory loss 09/12/2013  . Systolic heart failure, chronic ([place]) 05/12/2013  . Medicare annual wellness visit, subsequent 11/10/2012  . Sinus node dysfunction ([place]) 08/01/2012  . Low back pain 08/01/2012  . Cervical spine pain 05/02/2012  . Irritable bowel syndrome 11/02/2011  . Depression 04/01/2011  . Hypertension 04/01/2011  . Menopausal disorder 04/01/2011  .  Screening for breast cancer 04/01/2011  . Chronic kidney disease, stage 5, kidney failure ([place]) 04/01/2011    Joneen Boers PT, DPT   05/07/2015, 4:25 PM  [facility] [facility] [address] [place], [address] Phone: [phone]   Fax:  [phone]  Name: DEVAYA VELTRI MRN: [id number] Date of Birth: November 01, 1926

## 2015-05-09 ENCOUNTER — Ambulatory Visit: Payer: Medicare Other

## 2015-05-09 VITALS — BP 131/61 | HR 68

## 2015-05-09 DIAGNOSIS — Z992 Dependence on renal dialysis: Secondary | ICD-10-CM | POA: Diagnosis not present

## 2015-05-09 DIAGNOSIS — M545 Low back pain: Secondary | ICD-10-CM

## 2015-05-09 DIAGNOSIS — M549 Dorsalgia, unspecified: Secondary | ICD-10-CM | POA: Diagnosis not present

## 2015-05-09 DIAGNOSIS — R2689 Other abnormalities of gait and mobility: Secondary | ICD-10-CM | POA: Diagnosis not present

## 2015-05-09 DIAGNOSIS — M6281 Muscle weakness (generalized): Secondary | ICD-10-CM

## 2015-05-09 DIAGNOSIS — N186 End stage renal disease: Secondary | ICD-10-CM | POA: Diagnosis not present

## 2015-05-09 NOTE — Therapy (Signed)
[place] [facility] [address], [place], [address] Phone: [phone]   Fax:  [phone]  Physical Therapy Treatment  Patient Details  [name] MRN: [id number] Date of Birth: December 12, 1926 Referring Provider: Ronette Deter, MD  Encounter Date: 05/09/2015      PT End of Session - 05/09/15 1435    Visit Number 4   Number of Visits 9   Date for PT Re-Evaluation 05/30/15   Authorization Type 4   Authorization Time Period of 10   PT Start Time 1435   PT Stop Time 1516   PT Time Calculation (min) 41 min   Activity Tolerance Patient tolerated treatment well   Behavior During Therapy [facility] for tasks assessed/performed      Past Medical History  Diagnosis Date  . Peritoneal dialysis status ([place])   . Meniere disease   . Meniere's disease   . Kidney failure July 2012  . Kidney failure     Past Surgical History  Procedure Laterality Date  . Pacemaker insertion  2006  . Cholecystectomy  01/2008  . Total knee arthroplasty  2008    LEFT/Dr Calif  . Cataract extraction  2006, 2011  . Insertion of dialysis catheter  07/2010  . Abdominal adhesion surgery  1958    Filed Vitals:   05/09/15 1443  BP: 131/61  Pulse: 68        Subjective Assessment - 05/09/15 1438    Subjective Pt states having bilateral swollen feet and ankles which is unusual for both of them. Pt states feeling better today compared to the other day. Not as short of breath.    Pertinent History Pt states having Menire's disease gives her balance problems. Has been in remission for a while unless she moves and turns her head back. Was diagnosed in the 1970's. No complain of dizziness or the room spinning. Pt states that the falls that she had were due to balance. One time she was sitting, turned to the L to check her catheter and  continued falling. Pt states that she could not stop. Pt states having a bad fall 3 months ago and bruised her  chest.  Took a while to recover. Pt also states concern about losing weight  (both Dr. Gilford Rile and Dr Holley Raring knows about her weight loss). Pt state her apetite is not good but makes herself eat. Pt states that she still is losing weight.   Currenlty is about 111 lbs.   Pt states being taken off triamterene for Menier's diseaes for over a year because of its adverse affects of her kidney. Pt lives alone. Pt states having some neck pain for 40 years.  Had 3 bad falls within the past 2 months.  Pt sates falling 3 times all at the same place in her bed room where she hooks herself up for her dialysis. Pt stood up off her bed but kept going forward, resulting in a fall. Could not stop herself. Another time she fell, rolling  off her bed trying to get her glasses. Other time she fell, she was sitting on a chair, turned to the L, leaned forward, and could not get back up. Pt continued falling. Pt does not remember if she was dizzy, or if the room was spinning.    Patient Stated Goals "I would like to be able to function as long as I can to take care of myself."   Currently in Pain? Other (Comment)  no complain of pain           Objectives   There-ex   BP measured  Directed patient with sitting with proper posture resisting gentle manual perturbation from PT 3x all directions to promote trunk strength to keep her from falling forward (such as when she is sitting and leaning forward or to the side)   Sit <> stand from elevated mat table 5x2 with bilateral UE to no UE assist  Standing, turning 360 degrees 2x each direction (some dizziness when turning to the L, returned to baseline with rest).  No LOB  Standing, feet shoulder width apart with eyes closed 5 x 2 seconds, then 5x 5 seconds for 2 sets, cues for feeling the ground with LE, and placing body weight over LE  Standing hip flexion with bilateral finger touch assist 5x each LE  Then with finger touch assist from one UE 5x each LE (more  difficult, pt placed more support onto her R fingers)  Standing with opposite tip toe assist with bilateral UE assist 5x5 seconds each LE; and 3x5 seconds each LE  Then without UE assist 3x5 seconds each LE  Seated bilateral ankle DF 10x (to gently stretch soleus)  Rest breaks provided.    Improved exercise technique, movement at target joints, use of target muscles after mod verbal, visual, tactile cues.    Able to perform 360 degree turns safely without LOB today, some dizziness with L turn which subsided with rest. Able to maintain balance with eyes closed when using sensory information from LE to the ground.                         PT Education - 05/09/15 1442    Education provided Yes   Education Details ther-ex   [employer]) Educated Patient   Methods Explanation;Demonstration;Tactile cues;Verbal cues   Comprehension Verbalized understanding;Returned demonstration             PT Long Term Goals - 05/07/15 1623    PT LONG TERM GOAL #1   Title Patient will improve bilateral LE strength by at least 1/2 MMT grade to promote balance   Time 4   Period Weeks   Status On-going   PT LONG TERM GOAL #2   Title Patient will improve her BERG balance score by at least 10 points as a demonstration of improved balance.   Baseline 26/56   Time 4   Period Weeks   Status On-going   PT LONG TERM GOAL #3   Title Patient will be able to pick up an item from the ground independently and with minimal to no complain of back pain to promote balance and function at home.    Baseline Back pain when getting up after picking up an item from the floor, unsteady, and needed help.   Time 4   Period Weeks   Status On-going               Plan - 05/09/15 1443    Clinical Impression Statement Able to perform 360 degree turns safely without LOB today, some dizziness with L turn which subsided with rest. Able to maintain balance with eyes closed when using sensory  information from LE to the ground.    Rehab Potential Fair   Clinical Impairments Affecting Rehab Potential Menier's disease   PT Frequency 2x / week   PT Duration 4 weeks   PT Treatment/Interventions Therapeutic activities;Therapeutic exercise;Manual techniques;Canalith Repostioning;Balance training;Neuromuscular re-education  PT Next Visit Plan LE strengthening, balance   Consulted and Agree with Plan of Care Patient      Patient will benefit from skilled therapeutic intervention in order to improve the following deficits and impairments:  Decreased strength, Dizziness, Decreased balance, Pain  Visit Diagnosis: Bilateral low back pain, with sciatica presence unspecified  Muscle weakness (generalized)     Problem List Patient Active Problem List   Diagnosis Date Noted  . Imbalance 04/22/2015  . Electrical shock sensation in right posterior head 11/22/2014  . Fall at home 11/15/2014  . Expressive aphasia 10/04/2014  . Chronic pain syndrome 05/22/2014  . Insomnia 03/13/2014  . Anxiety state 03/13/2014  . Basal cell carcinoma of neck 11/14/2013  . Pelvic pain in female 09/12/2013  . Neck pain of over 3 months duration 09/12/2013  . Memory loss 09/12/2013  . Systolic heart failure, chronic ([place]) 05/12/2013  . Medicare annual wellness visit, subsequent 11/10/2012  . Sinus node dysfunction ([place]) 08/01/2012  . Low back pain 08/01/2012  . Cervical spine pain 05/02/2012  . Irritable bowel syndrome 11/02/2011  . Depression 04/01/2011  . Hypertension 04/01/2011  . Menopausal disorder 04/01/2011  . Screening for breast cancer 04/01/2011  . Chronic kidney disease, stage 5, kidney failure ([place]) 04/01/2011    Joneen Boers PT, DPT    05/09/2015, 7:15 PM  [place] [facility] [address] [place], [address] Phone: [phone]   Fax:  [phone]  [name] MRN: [id number] Date of Birth:  10-13-1926

## 2015-05-10 DIAGNOSIS — N186 End stage renal disease: Secondary | ICD-10-CM | POA: Diagnosis not present

## 2015-05-10 DIAGNOSIS — Z992 Dependence on renal dialysis: Secondary | ICD-10-CM | POA: Diagnosis not present

## 2015-05-12 DIAGNOSIS — N186 End stage renal disease: Secondary | ICD-10-CM | POA: Diagnosis not present

## 2015-05-12 DIAGNOSIS — Z992 Dependence on renal dialysis: Secondary | ICD-10-CM | POA: Diagnosis not present

## 2015-05-13 DIAGNOSIS — N186 End stage renal disease: Secondary | ICD-10-CM | POA: Diagnosis not present

## 2015-05-13 DIAGNOSIS — Z992 Dependence on renal dialysis: Secondary | ICD-10-CM | POA: Diagnosis not present

## 2015-05-14 DIAGNOSIS — N186 End stage renal disease: Secondary | ICD-10-CM | POA: Diagnosis not present

## 2015-05-14 DIAGNOSIS — Z992 Dependence on renal dialysis: Secondary | ICD-10-CM | POA: Diagnosis not present

## 2015-05-16 DIAGNOSIS — Z992 Dependence on renal dialysis: Secondary | ICD-10-CM | POA: Diagnosis not present

## 2015-05-16 DIAGNOSIS — N186 End stage renal disease: Secondary | ICD-10-CM | POA: Diagnosis not present

## 2015-05-17 DIAGNOSIS — N186 End stage renal disease: Secondary | ICD-10-CM | POA: Diagnosis not present

## 2015-05-17 DIAGNOSIS — Z992 Dependence on renal dialysis: Secondary | ICD-10-CM | POA: Diagnosis not present

## 2015-05-19 DIAGNOSIS — N186 End stage renal disease: Secondary | ICD-10-CM | POA: Diagnosis not present

## 2015-05-19 DIAGNOSIS — Z992 Dependence on renal dialysis: Secondary | ICD-10-CM | POA: Diagnosis not present

## 2015-05-20 DIAGNOSIS — N186 End stage renal disease: Secondary | ICD-10-CM | POA: Diagnosis not present

## 2015-05-20 DIAGNOSIS — Z992 Dependence on renal dialysis: Secondary | ICD-10-CM | POA: Diagnosis not present

## 2015-05-21 DIAGNOSIS — N186 End stage renal disease: Secondary | ICD-10-CM | POA: Diagnosis not present

## 2015-05-21 DIAGNOSIS — Z992 Dependence on renal dialysis: Secondary | ICD-10-CM | POA: Diagnosis not present

## 2015-05-22 DIAGNOSIS — E782 Mixed hyperlipidemia: Secondary | ICD-10-CM | POA: Diagnosis not present

## 2015-05-22 DIAGNOSIS — I5022 Chronic systolic (congestive) heart failure: Secondary | ICD-10-CM | POA: Diagnosis not present

## 2015-05-22 DIAGNOSIS — I10 Essential (primary) hypertension: Secondary | ICD-10-CM | POA: Diagnosis not present

## 2015-05-22 DIAGNOSIS — I2510 Atherosclerotic heart disease of native coronary artery without angina pectoris: Secondary | ICD-10-CM | POA: Diagnosis not present

## 2015-05-23 DIAGNOSIS — N186 End stage renal disease: Secondary | ICD-10-CM | POA: Diagnosis not present

## 2015-05-23 DIAGNOSIS — Z992 Dependence on renal dialysis: Secondary | ICD-10-CM | POA: Diagnosis not present

## 2015-05-24 ENCOUNTER — Encounter: Payer: Self-pay | Admitting: Internal Medicine

## 2015-05-24 ENCOUNTER — Ambulatory Visit (INDEPENDENT_AMBULATORY_CARE_PROVIDER_SITE_OTHER): Payer: Medicare Other | Admitting: Internal Medicine

## 2015-05-24 VITALS — BP 136/74 | HR 66 | Temp 97.6°F | Ht 64.0 in | Wt 112.4 lb

## 2015-05-24 DIAGNOSIS — R1310 Dysphagia, unspecified: Secondary | ICD-10-CM | POA: Diagnosis not present

## 2015-05-24 DIAGNOSIS — R0600 Dyspnea, unspecified: Secondary | ICD-10-CM | POA: Diagnosis not present

## 2015-05-24 DIAGNOSIS — N186 End stage renal disease: Secondary | ICD-10-CM | POA: Diagnosis not present

## 2015-05-24 DIAGNOSIS — I5022 Chronic systolic (congestive) heart failure: Secondary | ICD-10-CM | POA: Diagnosis not present

## 2015-05-24 DIAGNOSIS — R2689 Other abnormalities of gait and mobility: Secondary | ICD-10-CM | POA: Diagnosis not present

## 2015-05-24 DIAGNOSIS — R0602 Shortness of breath: Secondary | ICD-10-CM | POA: Insufficient documentation

## 2015-05-24 DIAGNOSIS — Z79891 Long term (current) use of opiate analgesic: Secondary | ICD-10-CM | POA: Diagnosis not present

## 2015-05-24 DIAGNOSIS — Z992 Dependence on renal dialysis: Secondary | ICD-10-CM | POA: Diagnosis not present

## 2015-05-24 NOTE — Patient Instructions (Addendum)
Continue current medications.  We will set up evaluation with Dr. Tami Ribas.  Please got to [facility] for repeat chest xray.  Follow up in 3 months.

## 2015-05-24 NOTE — Assessment & Plan Note (Signed)
Balance improved after PT. No recent falls.

## 2015-05-24 NOTE — Assessment & Plan Note (Signed)
Chronic, progressive dyspnea with minimal exertion. Severe systolic CHF, likely contributing, however medications for management limited by ESRD. Recent cardiology evaluation, notes pending. Will follow. Euvolemic on exam today. Follow up in 3 months and prn.

## 2015-05-24 NOTE — Assessment & Plan Note (Signed)
Progressively worsening dysphagia. Reports h/o swallow study in distant past which was abnormal. Will set up ENT evaluation for possible direct visualization.

## 2015-05-24 NOTE — Progress Notes (Signed)
Pre visit review using our clinic review tool, if applicable. No additional management support is needed unless otherwise documented below in the visit note. 

## 2015-05-24 NOTE — Progress Notes (Signed)
Subjective:    Patient ID: Sharon [name], female    DOB: Mar 31, 1926, 82 y.o.   MRN: [id number]  HPI  82YO female presents for followup.  Last seen in 03/2015 for gait imbalance. Started with PT. Then, seen by Dr. Nehemiah Massed, who recommended that she stop PT because of dyspnea. She reports that she has dyspnea with any exertion including speaking. He did not recommend any changes to medications. Continues on Toprol 25mg  daily.  Having some trouble swallowing at times. Had swallowing study in past which showed compression on esophagus.   Wt Readings from Last 3 Encounters:  05/24/15 112 lb 6.4 oz (50.984 kg)  04/22/15 116 lb 2 oz (52.674 kg)  01/16/15 122 lb 4 oz (55.452 kg)   BP Readings from Last 3 Encounters:  05/24/15 136/74  05/09/15 131/61  05/07/15 131/52    Past Medical History  Diagnosis Date  . Peritoneal dialysis status ([place])   . Meniere disease   . Meniere's disease   . Kidney failure July 2012  . Kidney failure    Family History  Problem Relation Age of Onset  . Cancer Mother     ? ovarian - sarcoma  . Cancer Father     Skin cancer  . Diabetes Sister   . COPD Sister   . Depression Sister   . Cancer Sister     Lung - 64 yrs old   Past Surgical History  Procedure Laterality Date  . Pacemaker insertion  2006  . Cholecystectomy  01/2008  . Total knee arthroplasty  2008    LEFT/Dr Calif  . Cataract extraction  2006, 2011  . Insertion of dialysis catheter  07/2010  . Abdominal adhesion surgery  1958   Social History   Social History  . Marital Status: Married    Spouse Name: N/A  . Number of Children: 0  . Years of Education: N/A   Occupational History  . Retired     Social History Main Topics  . Smoking status: Former Smoker    Quit date: 03/31/1948  . Smokeless tobacco: Never Used  . Alcohol Use: No  . Drug Use: No  . Sexual Activity: Not Asked   Other Topics Concern  . None   Social History Narrative   Lives in [place] alone.  Widow 2012.      Regular Exercise -  Housework   Daily Caffeine Use:  1 - 2 cups coffee             Review of Systems  Constitutional: Negative for fever, chills, appetite change, fatigue and unexpected weight change.  HENT: Positive for trouble swallowing. Negative for sore throat.   Eyes: Negative for visual disturbance.  Respiratory: Positive for shortness of breath. Negative for cough, chest tightness and wheezing.   Cardiovascular: Negative for chest pain, palpitations and leg swelling.  Gastrointestinal: Negative for abdominal pain, diarrhea and constipation.  Skin: Negative for color change and rash.  Hematological: Negative for adenopathy. Does not bruise/bleed easily.  Psychiatric/Behavioral: Negative for sleep disturbance and dysphoric mood. The patient is not nervous/anxious.        Objective:    BP 136/74 mmHg  Pulse 66  Temp(Src) 97.6 F (36.4 C) (Oral)  Ht 5\' 4"  (1.626 m)  Wt 112 lb 6.4 oz (50.984 kg)  BMI 19.28 kg/m2  SpO2 97% Physical Exam  Constitutional: She is oriented to person, place, and time. She appears well-developed and well-nourished. No distress.  HENT:  Head: Normocephalic and atraumatic.  Right Ear: External ear normal.  Left Ear: External ear normal.  Nose: Nose normal.  Mouth/Throat: Oropharynx is clear and moist. No oropharyngeal exudate.  Eyes: Conjunctivae are normal. Pupils are equal, round, and reactive to light. Right eye exhibits no discharge. Left eye exhibits no discharge. No scleral icterus.  Neck: Normal range of motion. Neck supple. No tracheal deviation present. No thyromegaly present.  Cardiovascular: Normal rate, regular rhythm, normal heart sounds and intact distal pulses.  Exam reveals no gallop and no friction rub.   No murmur heard. Pulmonary/Chest: Effort normal and breath sounds normal. No respiratory distress. She has no wheezes. She has no rales. She exhibits no tenderness.  Musculoskeletal: Normal range of motion.  She exhibits no edema or tenderness.  Lymphadenopathy:    She has no cervical adenopathy.  Neurological: She is alert and oriented to person, place, and time. No cranial nerve deficit. She exhibits normal muscle tone. Coordination normal.  Skin: Skin is warm and dry. No rash noted. She is not diaphoretic. No erythema. No pallor.  Psychiatric: She has a normal mood and affect. Her behavior is normal. Judgment and thought content normal.          Assessment & Plan:   Problem List Items Addressed This Visit      Unprioritized   Dysphagia    Progressively worsening dysphagia. Reports h/o swallow study in distant past which was abnormal. Will set up ENT evaluation for possible direct visualization.      Relevant Orders   Ambulatory referral to ENT   Dyspnea    Chronic, progressive dyspnea with minimal exertion. Severe systolic CHF, likely contributing, however medications for management limited by ESRD. Recent cardiology evaluation, notes pending. Will follow. Euvolemic on exam today. Follow up in 3 months and prn.      Relevant Orders   DG Chest 2 View   Imbalance - Primary    Balance improved after PT. No recent falls.      Systolic heart failure, chronic (HCC)    Severe LV dysfunction by ECHO. Seen by cardiology this week. No changes made to medication.          Return in about 3 months (around 08/23/2015) for Recheck.  Ronette Deter, MD Internal Medicine [facility]

## 2015-05-24 NOTE — Assessment & Plan Note (Signed)
Severe LV dysfunction by ECHO. Seen by cardiology this week. No changes made to medication.

## 2015-05-26 DIAGNOSIS — N186 End stage renal disease: Secondary | ICD-10-CM | POA: Diagnosis not present

## 2015-05-26 DIAGNOSIS — Z992 Dependence on renal dialysis: Secondary | ICD-10-CM | POA: Diagnosis not present

## 2015-05-27 DIAGNOSIS — Z992 Dependence on renal dialysis: Secondary | ICD-10-CM | POA: Diagnosis not present

## 2015-05-27 DIAGNOSIS — N186 End stage renal disease: Secondary | ICD-10-CM | POA: Diagnosis not present

## 2015-05-28 ENCOUNTER — Other Ambulatory Visit: Payer: Self-pay | Admitting: Unknown Physician Specialty

## 2015-05-28 DIAGNOSIS — R1310 Dysphagia, unspecified: Secondary | ICD-10-CM

## 2015-05-28 DIAGNOSIS — R221 Localized swelling, mass and lump, neck: Secondary | ICD-10-CM | POA: Diagnosis not present

## 2015-05-29 ENCOUNTER — Ambulatory Visit: Payer: Medicare Other

## 2015-05-31 ENCOUNTER — Ambulatory Visit
Admission: RE | Admit: 2015-05-31 | Discharge: 2015-05-31 | Disposition: A | Discharge status: Home / Self Care | Payer: Medicare Other | Source: Ambulatory Visit | Attending: Unknown Physician Specialty | Admitting: Unknown Physician Specialty

## 2015-05-31 DIAGNOSIS — K228 Other specified diseases of esophagus: Secondary | ICD-10-CM | POA: Diagnosis not present

## 2015-05-31 DIAGNOSIS — R1310 Dysphagia, unspecified: Secondary | ICD-10-CM | POA: Diagnosis not present

## 2015-05-31 DIAGNOSIS — R221 Localized swelling, mass and lump, neck: Secondary | ICD-10-CM

## 2015-06-04 DIAGNOSIS — I495 Sick sinus syndrome: Secondary | ICD-10-CM | POA: Diagnosis not present

## 2015-06-07 ENCOUNTER — Other Ambulatory Visit: Payer: Self-pay | Admitting: Internal Medicine

## 2015-06-10 ENCOUNTER — Ambulatory Visit: Payer: Medicare Other

## 2015-06-12 ENCOUNTER — Encounter: Payer: Self-pay | Admitting: Internal Medicine

## 2015-06-20 DIAGNOSIS — F32A Depression, unspecified: Secondary | ICD-10-CM | POA: Insufficient documentation

## 2015-06-20 DIAGNOSIS — F329 Major depressive disorder, single episode, unspecified: Secondary | ICD-10-CM | POA: Insufficient documentation

## 2015-06-20 DIAGNOSIS — E782 Mixed hyperlipidemia: Secondary | ICD-10-CM | POA: Insufficient documentation

## 2015-06-25 ENCOUNTER — Encounter: Payer: Self-pay | Admitting: Gastroenterology

## 2015-06-25 ENCOUNTER — Ambulatory Visit (INDEPENDENT_AMBULATORY_CARE_PROVIDER_SITE_OTHER): Payer: Medicare Other | Admitting: Gastroenterology

## 2015-06-25 VITALS — BP 111/67 | HR 86 | Temp 97.6°F | Ht 64.5 in | Wt 111.0 lb

## 2015-06-25 DIAGNOSIS — R197 Diarrhea, unspecified: Secondary | ICD-10-CM

## 2015-06-25 DIAGNOSIS — R634 Abnormal weight loss: Secondary | ICD-10-CM | POA: Diagnosis not present

## 2015-06-25 MED ORDER — DIPHENOXYLATE-ATROPINE 2.5-0.025 MG PO TABS: 1.0000 | ORAL_TABLET | Freq: Four times a day (QID) | ORAL | Status: DC | PRN

## 2015-06-25 NOTE — Progress Notes (Signed)
Primary Care Physician: Rica Mast, MD  Primary Gastroenterologist:  Dr. [name]  Chief Complaint  Patient presents with  . Diarrhea    HPI: Sharon [name] is a 82 y.o. female here Who comes here today with continued diarrhea. The patient reports that she had diarrhea since she's approximately 82 years old. The patient has been told that she has spastic colon. The patient has started to lose weight and states she is lost approximate 40 pounds. The patient also reports this to coincide with her diagnosis of renal failure. The patient is now on peritoneal dialysis. She denies any black stools or bloody stools and the patient had a flexible sigmoidoscopy in January 2016 that showed a normal exam with normal biopsies. The patient also reports that she has bowel movements only after eating. She denies any diarrhea when she is sleeping or when she is not eating. She reports that she has EN last because of this. The patient was put on a high protein diet but states she has a hard time sticking to it. There is no report of any black stools or bloody stools. The patient also reports that she has been told in the past that she is not a candidate for any endoscopic procedures due to her heart disease. There is no report of any nausea or vomiting.  Current Outpatient Prescriptions  Medication Sig Dispense Refill  . B Complex-C-Folic Acid (DIALYVITE TABLET) TABS TAKE 1 BY MOUTH DAILY 30 each 10  . folic acid-vitamin b complex-vitamin c-selenium-zinc (DIALYVITE) 3 MG TABS tablet Take 1 tablet by mouth daily. 30 tablet 5  . gentamicin ointment (GARAMYCIN) 0.1 %     . meclizine (ANTIVERT) 25 MG tablet Take 1 tablet (25 mg total) by mouth 3 (three) times daily as needed for dizziness. 30 tablet 0  . sertraline (ZOLOFT) 50 MG tablet TAKE 3 TABLETS(150 MG) BY MOUTH DAILY 90 tablet 2  . TOPROL XL 25 MG 24 hr tablet TAKE 1/2 BY MOUTH DAILY 30 tablet 11  . traMADol (ULTRAM) 50 MG tablet TAKE 1/2- 1  TABLET BY MOUTH THREE TIMES DAILY AS NEEDED 60 tablet 0  . Vitamin D, Ergocalciferol, (DRISDOL) 50000 units CAPS capsule     . diphenoxylate-atropine (LOMOTIL) 2.5-0.025 MG tablet Take 1 tablet by mouth 4 (four) times daily as needed for diarrhea or loose stools. 30 tablet 2   No current facility-administered medications for this visit.    Allergies as of 06/25/2015 - Review Complete 06/25/2015  Allergen Reaction Noted  . Effexor [venlafaxine]  01/16/2015  . Statins Other (See Comments) 11/15/2013  . Codeine Other (See Comments) 11/10/2012    ROS:  General: Negative for anorexia, weight loss, fever, chills, fatigue, weakness. ENT: Negative for hoarseness, difficulty swallowing , nasal congestion. CV: Negative for chest pain, angina, palpitations, dyspnea on exertion, peripheral edema.  Respiratory: Negative for dyspnea at rest, dyspnea on exertion, cough, sputum, wheezing.  GI: See history of present illness. GU:  Negative for dysuria, hematuria, urinary incontinence, urinary frequency, nocturnal urination.  Endo: Negative for unusual weight change.    Physical Examination:   BP 111/67 mmHg  Pulse 86  Temp(Src) 97.6 F (36.4 C) (Oral)  Ht 5' 4.5" (1.638 m)  Wt 111 lb (50.349 kg)  BMI 18.77 kg/m2  General: Well-nourished, well-developed in no acute distress.  Eyes: No icterus. Conjunctivae pink. Mouth: Oropharyngeal mucosa moist and pink , no lesions erythema or exudate. Lungs: Clear to auscultation bilaterally. Non-labored. Heart: Regular rate and rhythm, no  murmurs rubs or gallops.  Abdomen: Bowel sounds are normal, nontender, diffuse abdominal tenderness , no hepatosplenomegaly or masses, no abdominal bruits or hernia , no rebound or guarding.   Extremities: No lower extremity edema. No clubbing or deformities. Neuro: Alert and oriented x 3.  Grossly intact. Skin: Warm and dry, no jaundice.   Psych: Alert and cooperative, normal mood and affect.  Labs:    Imaging  Studies: Dg Esophagus  05/31/2015  CLINICAL DATA:  Dysphagia. EXAM: ESOPHOGRAM/BARIUM SWALLOW TECHNIQUE: Single contrast examination was performed using  thin barium. FLUOROSCOPY TIME:  Radiation Exposure Index (as provided by the fluoroscopic device): 10.6 mGy COMPARISON:  Chest x-ray 11/28/2014. FINDINGS: Limited exam due to patient difficulty swallowing. Esophagus is widely patent. No obstructing abnormality identified. No hiatal hernia. No reflux. Tertiary esophageal contractions consistent presbyesophagus. Standardized barium tablet passed normally. Cardiac pacer noted. IMPRESSION: Presbyesophagus, otherwise normal limited exam. No obstructing abnormality . Electronically Signed   By: [place]   On: 05/31/2015 12:27   US Soft Tissue Neck  05/31/2015  CLINICAL DATA:  Left neck mass.  History of skin cancer. EXAM: ULTRASOUND OF HEAD/NECK SOFT TISSUES TECHNIQUE: Ultrasound examination of the head and neck soft tissues was performed in the area of clinical concern. COMPARISON:  None. FINDINGS: Sonographic evaluation of the left neck and in the location of the surgical scar reveals no underlying soft tissue mass. IMPRESSION: No evidence of mass in the left neck as described. Electronically Signed   By: Marybelle Killings M.D.   On: 05/31/2015 13:20    Assessment and Plan:   Sharon [name] is a 82 y.o. y/o female who comes in today with a long history of irritable bowel syndrome who now reports that she has been having continued problems with her diarrhea. This has been a chronic problem for many years but she is now losing weight. The patient is not a candidate for any endoscopic procedures because of her history of heart disease. The patient had a barium esophagram for dysphagia which just showed a esophageal dysmotility disorder. The patient has been told that we will send off stool for possible infection although this is less likely. The patient may also have bacterial overgrowth and stool samples will  be checked for fecal fat and fecal leukocytes. The patient will also be started on Lomotil for her diarrhea to see if this helps keep it under control. The patient has been explained the plan and agrees with it.   Note: This dictation was prepared with Dragon dictation along with smaller phrase technology. Any transcriptional errors that result from this process are unintentional.

## 2015-06-26 DIAGNOSIS — N186 End stage renal disease: Secondary | ICD-10-CM | POA: Diagnosis not present

## 2015-06-26 DIAGNOSIS — Z992 Dependence on renal dialysis: Secondary | ICD-10-CM | POA: Diagnosis not present

## 2015-06-27 DIAGNOSIS — Z992 Dependence on renal dialysis: Secondary | ICD-10-CM | POA: Diagnosis not present

## 2015-06-27 DIAGNOSIS — N186 End stage renal disease: Secondary | ICD-10-CM | POA: Diagnosis not present

## 2015-07-01 DIAGNOSIS — R197 Diarrhea, unspecified: Secondary | ICD-10-CM | POA: Diagnosis not present

## 2015-07-05 ENCOUNTER — Telehealth: Payer: Self-pay

## 2015-07-05 NOTE — Telephone Encounter (Signed)
Pt notified of lab results

## 2015-07-05 NOTE — Telephone Encounter (Signed)
-----   Message from [name], MD sent at 07/04/2015  4:57 PM EDT ----- Let the patient know that her stool test came back positive for neurovirus which is a virus that can cause diarrhea. She should be treated with plenty of fluids and it should resolve with time.

## 2015-07-05 NOTE — Telephone Encounter (Signed)
LVM for pt to return my call.

## 2015-07-08 ENCOUNTER — Telehealth: Payer: Self-pay

## 2015-07-08 NOTE — Telephone Encounter (Signed)
-----   Message from [name], MD sent at 07/08/2015 12:28 PM EDT ----- Let the patient know that her stool test showed normal virus which is a viral infection that can cause diarrhea and she should feel better as the virus is for off by her body.

## 2015-07-08 NOTE — Telephone Encounter (Signed)
Duplicate message. Pt has already been notified of these results.

## 2015-07-10 LAB — GI PROFILE, STOOL, PCR
ADENOVIRUS F 40/41: NOT DETECTED
Astrovirus: NOT DETECTED
C difficile toxin A/B: NOT DETECTED
CRYPTOSPORIDIUM: NOT DETECTED
CYCLOSPORA CAYETANENSIS: NOT DETECTED
Campylobacter: NOT DETECTED
Entamoeba histolytica: NOT DETECTED
Enteroaggregative E coli: NOT DETECTED
Enteropathogenic E coli: NOT DETECTED
Enterotoxigenic E coli: NOT DETECTED
Giardia lamblia: NOT DETECTED
Norovirus GI/GII: DETECTED — AB
Plesiomonas shigelloides: NOT DETECTED
ROTAVIRUS A: NOT DETECTED
SALMONELLA: NOT DETECTED
SAPOVIRUS: NOT DETECTED
Shiga-toxin-producing E coli: NOT DETECTED
Shigella/Enteroinvasive E coli: NOT DETECTED
VIBRIO: NOT DETECTED
Vibrio cholerae: NOT DETECTED
Yersinia enterocolitica: NOT DETECTED

## 2015-07-10 LAB — FECAL FAT, QUALITATIVE
Fat Qual Neutral, Stl: NORMAL
Fat Qual Total, Stl: NORMAL

## 2015-07-10 LAB — FECAL LEUKOCYTES

## 2015-07-15 ENCOUNTER — Encounter: Payer: Self-pay | Admitting: Emergency Medicine

## 2015-07-15 ENCOUNTER — Other Ambulatory Visit: Payer: Self-pay

## 2015-07-15 ENCOUNTER — Emergency Department
Admission: EM | Admit: 2015-07-15 | Discharge: 2015-07-15 | Disposition: A | Discharge status: Home / Self Care | Payer: Medicare Other | Attending: Emergency Medicine | Admitting: Emergency Medicine

## 2015-07-15 ENCOUNTER — Emergency Department: Payer: Medicare Other

## 2015-07-15 DIAGNOSIS — I5022 Chronic systolic (congestive) heart failure: Secondary | ICD-10-CM | POA: Insufficient documentation

## 2015-07-15 DIAGNOSIS — M5030 Other cervical disc degeneration, unspecified cervical region: Secondary | ICD-10-CM | POA: Insufficient documentation

## 2015-07-15 DIAGNOSIS — S51812A Laceration without foreign body of left forearm, initial encounter: Secondary | ICD-10-CM | POA: Diagnosis not present

## 2015-07-15 DIAGNOSIS — S0990XA Unspecified injury of head, initial encounter: Secondary | ICD-10-CM | POA: Diagnosis not present

## 2015-07-15 DIAGNOSIS — Z87891 Personal history of nicotine dependence: Secondary | ICD-10-CM | POA: Insufficient documentation

## 2015-07-15 DIAGNOSIS — Y929 Unspecified place or not applicable: Secondary | ICD-10-CM | POA: Diagnosis not present

## 2015-07-15 DIAGNOSIS — Z79899 Other long term (current) drug therapy: Secondary | ICD-10-CM | POA: Diagnosis not present

## 2015-07-15 DIAGNOSIS — F329 Major depressive disorder, single episode, unspecified: Secondary | ICD-10-CM | POA: Insufficient documentation

## 2015-07-15 DIAGNOSIS — Z950 Presence of cardiac pacemaker: Secondary | ICD-10-CM | POA: Diagnosis not present

## 2015-07-15 DIAGNOSIS — Z853 Personal history of malignant neoplasm of breast: Secondary | ICD-10-CM | POA: Diagnosis not present

## 2015-07-15 DIAGNOSIS — W19XXXA Unspecified fall, initial encounter: Secondary | ICD-10-CM | POA: Insufficient documentation

## 2015-07-15 DIAGNOSIS — Y939 Activity, unspecified: Secondary | ICD-10-CM | POA: Insufficient documentation

## 2015-07-15 DIAGNOSIS — N185 Chronic kidney disease, stage 5: Secondary | ICD-10-CM | POA: Insufficient documentation

## 2015-07-15 DIAGNOSIS — R9431 Abnormal electrocardiogram [ECG] [EKG]: Secondary | ICD-10-CM | POA: Diagnosis not present

## 2015-07-15 DIAGNOSIS — Y999 Unspecified external cause status: Secondary | ICD-10-CM | POA: Insufficient documentation

## 2015-07-15 DIAGNOSIS — I132 Hypertensive heart and chronic kidney disease with heart failure and with stage 5 chronic kidney disease, or end stage renal disease: Secondary | ICD-10-CM | POA: Diagnosis not present

## 2015-07-15 DIAGNOSIS — S51811A Laceration without foreign body of right forearm, initial encounter: Secondary | ICD-10-CM | POA: Insufficient documentation

## 2015-07-15 DIAGNOSIS — I2510 Atherosclerotic heart disease of native coronary artery without angina pectoris: Secondary | ICD-10-CM | POA: Insufficient documentation

## 2015-07-15 LAB — CBC
HEMATOCRIT: 31.5 % — AB (ref 35.0–47.0)
Hemoglobin: 10.3 g/dL — ABNORMAL LOW (ref 12.0–16.0)
MCH: 28.6 pg (ref 26.0–34.0)
MCHC: 32.8 g/dL (ref 32.0–36.0)
MCV: 86.9 fL (ref 80.0–100.0)
PLATELETS: 197 10*3/uL (ref 150–440)
RBC: 3.62 MIL/uL — ABNORMAL LOW (ref 3.80–5.20)
RDW: 15.3 % — AB (ref 11.5–14.5)
WBC: 4.9 10*3/uL (ref 3.6–11.0)

## 2015-07-15 LAB — TROPONIN I: TROPONIN I: 0.06 ng/mL — AB (ref <0.031)

## 2015-07-15 LAB — BASIC METABOLIC PANEL
Anion gap: 7 (ref 5–15)
BUN: 37 mg/dL — AB (ref 6–20)
CALCIUM: 9.2 mg/dL (ref 8.9–10.3)
CO2: 28 mmol/L (ref 22–32)
CREATININE: 2.22 mg/dL — AB (ref 0.44–1.00)
Chloride: 106 mmol/L (ref 101–111)
GFR calc Af Amer: 22 mL/min — ABNORMAL LOW (ref >60)
GFR, EST NON AFRICAN AMERICAN: 19 mL/min — AB (ref >60)
GLUCOSE: 103 mg/dL — AB (ref 65–99)
Potassium: 3.5 mmol/L (ref 3.5–5.1)
SODIUM: 141 mmol/L (ref 135–145)

## 2015-07-15 LAB — URINALYSIS COMPLETE WITH MICROSCOPIC (ARMC ONLY)
BACTERIA UA: NONE SEEN
BILIRUBIN URINE: NEGATIVE
GLUCOSE, UA: NEGATIVE mg/dL
Ketones, ur: NEGATIVE mg/dL
Leukocytes, UA: NEGATIVE
NITRITE: NEGATIVE
Protein, ur: >500 mg/dL — AB
Specific Gravity, Urine: 1.018 (ref 1.005–1.030)
pH: 5 (ref 5.0–8.0)

## 2015-07-15 NOTE — ED Provider Notes (Signed)
[facility] Emergency Department Provider Note   ____________________________________________  Time seen: ~1635  I have reviewed the triage vital signs and the nursing notes.   HISTORY  Chief Complaint Fall and Fatigue   History limited by: Not Limited   HPI Sharon [name] is a 82 y.o. female who presents to the emergency department today because of concern for falls. Patient states that she has a long history of falling. The past couple of days however she has fallen more frequently. She states she had 3 falls yesterday. She does not black out during these falls. No significant chest pain or palpitations. She has had a long history of balance problems and attributes her falls to this. Today she had 2 falls this morning. She hit the back of her head today. In addition she suffered lacerations to her right wrist. Patient denies any recent fevers. She says she has worked with PT in the past for her balance issues.   Past Medical History  Diagnosis Date  . Peritoneal dialysis status ([place])   . Meniere disease   . Meniere's disease   . Kidney failure July 2012  . Kidney failure     Patient Active Problem List   Diagnosis Date Noted  . Clinical depression 06/20/2015  . Combined fat and carbohydrate induced hyperlipemia 06/20/2015  . Dysphagia 05/24/2015  . Dyspnea 05/24/2015  . Imbalance 04/22/2015  . Electrical shock sensation in right posterior head 11/22/2014  . Fall at home 11/15/2014  . Expressive aphasia 10/04/2014  . Breathlessness on exertion 09/13/2014  . Chronic pain syndrome 05/22/2014  . Insomnia 03/13/2014  . Anxiety state 03/13/2014  . Arteriosclerosis of coronary artery 01/15/2014  . Chronic systolic heart failure ([place]) 01/15/2014  . Obstructive apnea 01/15/2014  . Basal cell carcinoma of neck 11/14/2013  . Degeneration of intervertebral disc of cervical region 11/07/2013  . Cervical radiculitis 10/16/2013  . Pelvic pain in female  09/12/2013  . Neck pain of over 3 months duration 09/12/2013  . Memory loss 09/12/2013  . Systolic heart failure, chronic ([place]) 05/12/2013  . Medicare annual wellness visit, subsequent 11/10/2012  . Sinus node dysfunction ([place]) 08/01/2012  . Low back pain 08/01/2012  . Cervical spine pain 05/02/2012  . Irritable bowel syndrome 11/02/2011  . Depression 04/01/2011  . Hypertension 04/01/2011  . Menopausal disorder 04/01/2011  . Screening for breast cancer 04/01/2011  . Chronic kidney disease, stage 5, kidney failure ([place]) 04/01/2011    Past Surgical History  Procedure Laterality Date  . Pacemaker insertion  2006  . Cholecystectomy  01/2008  . Total knee arthroplasty  2008    LEFT/Dr [place]  . Cataract extraction  2006, 2011  . Insertion of dialysis catheter  07/2010  . Abdominal adhesion surgery  1958    Current Outpatient Rx  Name  Route  Sig  Dispense  Refill  . B Complex-C-Folic Acid (DIALYVITE TABLET) TABS      TAKE 1 BY MOUTH DAILY   30 each   10   . diphenoxylate-atropine (LOMOTIL) 2.5-0.025 MG tablet   Oral   Take 1 tablet by mouth 4 (four) times daily as needed for diarrhea or loose stools.   30 tablet   2   . folic acid-vitamin b complex-vitamin c-selenium-zinc (DIALYVITE) 3 MG TABS tablet   Oral   Take 1 tablet by mouth daily.   30 tablet   5   . gentamicin ointment (GARAMYCIN) 0.1 %               .  meclizine (ANTIVERT) 25 MG tablet   Oral   Take 1 tablet (25 mg total) by mouth 3 (three) times daily as needed for dizziness.   30 tablet   0   . sertraline (ZOLOFT) 50 MG tablet      TAKE 3 TABLETS(150 MG) BY MOUTH DAILY   90 tablet   2   . TOPROL XL 25 MG 24 hr tablet      TAKE 1/2 BY MOUTH DAILY   30 tablet   11   . traMADol (ULTRAM) 50 MG tablet      TAKE 1/2- 1 TABLET BY MOUTH THREE TIMES DAILY AS NEEDED   60 tablet   0   . Vitamin D, Ergocalciferol, (DRISDOL) 50000 units CAPS capsule                 Allergies Effexor;  Statins; and Codeine  Family History  Problem Relation Age of Onset  . Cancer Mother     ? ovarian - sarcoma  . Cancer Father     Skin cancer  . Diabetes Sister   . COPD Sister   . Depression Sister   . Cancer Sister     Lung - 83 yrs old    Social History Social History  Substance Use Topics  . Smoking status: Former Smoker    Quit date: 03/31/1948  . Smokeless tobacco: Never Used  . Alcohol Use: No    Review of Systems  Constitutional: Negative for fever. Cardiovascular: Negative for chest pain. Respiratory: Negative for shortness of breath. Gastrointestinal: Negative for abdominal pain, vomiting and diarrhea. Genitourinary: Negative for dysuria. Musculoskeletal: Negative for back pain. Skin: Negative for rash. Neurological: Negative for headaches, focal weakness or numbness.  10-point ROS otherwise negative.  ____________________________________________   PHYSICAL EXAM:  VITAL SIGNS: ED Triage Vitals  Enc Vitals Group     BP 07/15/15 1450 133/59 mmHg     Pulse Rate 07/15/15 1450 68     Resp 07/15/15 1450 18     Temp 07/15/15 1450 98.1 F (36.7 C)     Temp Source 07/15/15 1450 Oral     SpO2 07/15/15 1450 94 %     Weight 07/15/15 1450 108 lb (48.988 kg)     Height 07/15/15 1450 5\' 4"  (1.626 m)   Constitutional: Alert and oriented. Well appearing and in no distress. Eyes: Conjunctivae are normal. PERRL. Normal extraocular movements. ENT   Head: Normocephalic. Ecchymosis to forehead.   Nose: No congestion/rhinnorhea.   Mouth/Throat: Mucous membranes are moist.   Neck: No stridor. No midline tenderness.  Hematological/Lymphatic/Immunilogical: No cervical lymphadenopathy. Cardiovascular: Normal rate, regular rhythm.  No murmurs, rubs, or gallops. Respiratory: Normal respiratory effort without tachypnea nor retractions. Breath sounds are clear and equal bilaterally. No wheezes/rales/rhonchi. Gastrointestinal: Soft and nontender. No distention.  There is no CVA tenderness. Genitourinary: Deferred Musculoskeletal: Normal range of motion in all extremities. No joint effusions.  No lower extremity tenderness nor edema. Pelvis stable. Hips non tender to palpation or manipulation. Right distal forearm with skin tears however no deformity or tenderness.  Neurologic:  Normal speech and language. No gross focal neurologic deficits are appreciated.  Skin:  Skin is warm, dry. Two small skin tears to distal right forearm. Psychiatric: Mood and affect are normal. Speech and behavior are normal. Patient exhibits appropriate insight and judgment.  ____________________________________________    LABS (pertinent positives/negatives)  Labs Reviewed  BASIC METABOLIC PANEL - Abnormal; Notable for the following:    Glucose, Bld 103 (*)  BUN 37 (*)    Creatinine, Ser 2.22 (*)    GFR calc non Af Amer 19 (*)    GFR calc Af Amer 22 (*)    All other components within normal limits  CBC - Abnormal; Notable for the following:    RBC 3.62 (*)    Hemoglobin 10.3 (*)    HCT 31.5 (*)    RDW 15.3 (*)    All other components within normal limits  URINALYSIS COMPLETEWITH MICROSCOPIC ([facility] ONLY) - Abnormal; Notable for the following:    Color, Urine YELLOW (*)    APPearance CLEAR (*)    Hgb urine dipstick 1+ (*)    Protein, ur >500 (*)    Squamous Epithelial / LPF 0-5 (*)    All other components within normal limits  TROPONIN I - Abnormal; Notable for the following:    Troponin I 0.06 (*)    All other components within normal limits     ____________________________________________   EKG  I, Nance Pear, attending physician, personally viewed and interpreted this EKG  EKG Time: 1504 Rate: 64 Rhythm: atrial sensed ventricular-paced rhythm Axis: left axis deviation Intervals: qtc 548 QRS: wide ST changes: no st elevation equivalent Impression: abnormal ekg   ____________________________________________    RADIOLOGY  CT  head  IMPRESSION: 1. 1. No acute displaced skull fractures or acute intracranial abnormalities. 2. Mild cerebral atrophy with chronic microvascular ischemic changes throughout cerebral white matter, as above.  ____________________________________________   PROCEDURES  Procedure(s) performed: None  Critical Care performed: No  ____________________________________________   INITIAL IMPRESSION / ASSESSMENT AND PLAN / ED COURSE  Pertinent labs & imaging results that were available during my care of the patient were reviewed by me and considered in my medical decision making (see chart for details).  She presented to the emergency department today after concerns for skin tears to the right forearm after a fall. CT head without any acute findings. Skin tears were dressed. Had a long discussion with patient and brother about further care for the patient. Did recommend that they consider living facility at this point. Patient is hesitant given cats however states she will start looking and visiting some facilities. Patient does live with a relative and states she has an alert button.   ____________________________________________   FINAL CLINICAL IMPRESSION(S) / ED DIAGNOSES  Final diagnoses:  Fall, initial encounter  Skin tear of forearm without complication, right, initial encounter     Note: This dictation was prepared with Dragon dictation. Any transcriptional errors that result from this process are unintentional    Nance Pear, MD 07/15/15 [id number]

## 2015-07-15 NOTE — Discharge Instructions (Signed)
Please seek medical attention for any high fevers, chest pain, shortness of breath, change in behavior, persistent vomiting, bloody stool or any other new or concerning symptoms.   Skin Tear Care A skin tear is when the top layer of skin peels off. To repair the skin, your doctor may use:   Tape.  Skin adhesive strips. HOME CARE  Change bandages (dressings) once a day or as told by your doctor.  Gently clean the area with salt (saline) solution or with a mild soap and water.  Do not rub the injured skin dry. Let the area air dry.  Put petroleum jelly or antibiotic cream on the tear. Do not allow a scab to form.  If the bandage sticks, moisten it with warm soapy water and remove it.  Protect the injured skin until it has healed.  Only take medicine as told by your doctor.  Take showers or baths using warm soapy water. Apply a new bandage after the shower or bath.  Keep all doctor visits as told. GET HELP RIGHT AWAY IF:   You have redness, puffiness (swelling), or more pain in the tear.  You have ayellowish-white fluid (pus) coming from the tear.  You have chills.  You have a red streak that goes away from the tear.  You have a bad smell coming from the tear or bandage.  You have a fever or lasting symptoms for more than 2-3 days.  You have a fever and your symptoms suddenly get worse. MAKE SURE YOU:   Understand these instructions.  Will watch this condition.  Will get help right away if you are not doing well or get worse.   This information is not intended to replace advice given to you by your health care provider. Make sure you discuss any questions you have with your health care provider.   Document Released: 10/22/2007 Document Revised: 10/07/2011 Document Reviewed: 07/27/2011 Elsevier Interactive Patient Education 2016 [place] in the Home  Falls can cause injuries. They can happen to people of all ages. There are many things you  can do to make your home safe and to help prevent falls.  WHAT CAN I DO ON THE OUTSIDE OF MY HOME?  Regularly fix the edges of walkways and driveways and fix any cracks.  Remove anything that might make you trip as you walk through a door, such as a raised step or threshold.  Trim any bushes or trees on the path to your home.  Use bright outdoor lighting.  Clear any walking paths of anything that might make someone trip, such as rocks or tools.  Regularly check to see if handrails are loose or broken. Make sure that both sides of any steps have handrails.  Any raised decks and porches should have guardrails on the edges.  Have any leaves, snow, or ice cleared regularly.  Use sand or salt on walking paths during winter.  Clean up any spills in your garage right away. This includes oil or grease spills. WHAT CAN I DO IN THE BATHROOM?   Use night lights.  Install grab bars by the toilet and in the tub and shower. Do not use towel bars as grab bars.  Use non-skid mats or decals in the tub or shower.  If you need to sit down in the shower, use a plastic, non-slip stool.  Keep the floor dry. Clean up any water that spills on the floor as soon as it happens.  Remove soap buildup in  the tub or shower regularly.  Attach bath mats securely with double-sided non-slip rug tape.  Do not have throw rugs and other things on the floor that can make you trip. WHAT CAN I DO IN THE BEDROOM?  Use night lights.  Make sure that you have a light by your bed that is easy to reach.  Do not use any sheets or blankets that are too big for your bed. They should not hang down onto the floor.  Have a firm chair that has side arms. You can use this for support while you get dressed.  Do not have throw rugs and other things on the floor that can make you trip. WHAT CAN I DO IN THE KITCHEN?  Clean up any spills right away.  Avoid walking on wet floors.  Keep items that you use a lot in  easy-to-reach places.  If you need to reach something above you, use a strong step stool that has a grab bar.  Keep electrical cords out of the way.  Do not use floor polish or wax that makes floors slippery. If you must use wax, use non-skid floor wax.  Do not have throw rugs and other things on the floor that can make you trip. WHAT CAN I DO WITH MY STAIRS?  Do not leave any items on the stairs.  Make sure that there are handrails on both sides of the stairs and use them. Fix handrails that are broken or loose. Make sure that handrails are as long as the stairways.  Check any carpeting to make sure that it is firmly attached to the stairs. Fix any carpet that is loose or worn.  Avoid having throw rugs at the top or bottom of the stairs. If you do have throw rugs, attach them to the floor with carpet tape.  Make sure that you have a light switch at the top of the stairs and the bottom of the stairs. If you do not have them, ask someone to add them for you. WHAT ELSE CAN I DO TO HELP PREVENT FALLS?  Wear shoes that:  Do not have high heels.  Have rubber bottoms.  Are comfortable and fit you well.  Are closed at the toe. Do not wear sandals.  If you use a stepladder:  Make sure that it is fully opened. Do not climb a closed stepladder.  Make sure that both sides of the stepladder are locked into place.  Ask someone to hold it for you, if possible.  Clearly mark and make sure that you can see:  Any grab bars or handrails.  First and last steps.  Where the edge of each step is.  Use tools that help you move around (mobility aids) if they are needed. These include:  Canes.  Walkers.  Scooters.  Crutches.  Turn on the lights when you go into a dark area. Replace any light bulbs as soon as they burn out.  Set up your furniture so you have a clear path. Avoid moving your furniture around.  If any of your floors are uneven, fix them.  If there are any pets  around you, be aware of where they are.  Review your medicines with your doctor. Some medicines can make you feel dizzy. This can increase your chance of falling. Ask your doctor what other things that you can do to help prevent falls.   This information is not intended to replace advice given to you by your health care provider.  Make sure you discuss any questions you have with your health care provider.   Document Released: 11/08/2008 Document Revised: 05/29/2014 Document Reviewed: 02/16/2014 Elsevier Interactive Patient Education [employer].

## 2015-07-15 NOTE — ED Notes (Signed)
Pt presents with multiple falls since yesterday. Pt lives alone and does PD fives days a week at home. The nurse from [facility] advised her to come to ED for further evaluation of falls.

## 2015-07-16 ENCOUNTER — Telehealth: Payer: Self-pay

## 2015-07-16 NOTE — Telephone Encounter (Signed)
-----   Message from [name], MD sent at 07/15/2015 12:19 PM EDT ----- Let the patient know that her stools showed her to have normal virus which is a virus that can cause diarrhea. This should go away in time.

## 2015-07-16 NOTE — Telephone Encounter (Signed)
Pt has been notified of results.

## 2015-07-26 DIAGNOSIS — N186 End stage renal disease: Secondary | ICD-10-CM | POA: Diagnosis not present

## 2015-07-26 DIAGNOSIS — Z992 Dependence on renal dialysis: Secondary | ICD-10-CM | POA: Diagnosis not present

## 2015-07-28 DIAGNOSIS — N2581 Secondary hyperparathyroidism of renal origin: Secondary | ICD-10-CM | POA: Diagnosis not present

## 2015-07-28 DIAGNOSIS — Z992 Dependence on renal dialysis: Secondary | ICD-10-CM | POA: Diagnosis not present

## 2015-07-28 DIAGNOSIS — N186 End stage renal disease: Secondary | ICD-10-CM | POA: Diagnosis not present

## 2015-07-28 DIAGNOSIS — D509 Iron deficiency anemia, unspecified: Secondary | ICD-10-CM | POA: Diagnosis not present

## 2015-07-29 DIAGNOSIS — N2581 Secondary hyperparathyroidism of renal origin: Secondary | ICD-10-CM | POA: Diagnosis not present

## 2015-07-29 DIAGNOSIS — N186 End stage renal disease: Secondary | ICD-10-CM | POA: Diagnosis not present

## 2015-07-29 DIAGNOSIS — D509 Iron deficiency anemia, unspecified: Secondary | ICD-10-CM | POA: Diagnosis not present

## 2015-07-29 DIAGNOSIS — Z992 Dependence on renal dialysis: Secondary | ICD-10-CM | POA: Diagnosis not present

## 2015-07-30 DIAGNOSIS — D509 Iron deficiency anemia, unspecified: Secondary | ICD-10-CM | POA: Diagnosis not present

## 2015-07-30 DIAGNOSIS — N2581 Secondary hyperparathyroidism of renal origin: Secondary | ICD-10-CM | POA: Diagnosis not present

## 2015-07-30 DIAGNOSIS — N186 End stage renal disease: Secondary | ICD-10-CM | POA: Diagnosis not present

## 2015-07-30 DIAGNOSIS — Z992 Dependence on renal dialysis: Secondary | ICD-10-CM | POA: Diagnosis not present

## 2015-08-01 DIAGNOSIS — D509 Iron deficiency anemia, unspecified: Secondary | ICD-10-CM | POA: Diagnosis not present

## 2015-08-01 DIAGNOSIS — Z992 Dependence on renal dialysis: Secondary | ICD-10-CM | POA: Diagnosis not present

## 2015-08-01 DIAGNOSIS — N186 End stage renal disease: Secondary | ICD-10-CM | POA: Diagnosis not present

## 2015-08-01 DIAGNOSIS — N2581 Secondary hyperparathyroidism of renal origin: Secondary | ICD-10-CM | POA: Diagnosis not present

## 2015-08-02 DIAGNOSIS — N2581 Secondary hyperparathyroidism of renal origin: Secondary | ICD-10-CM | POA: Diagnosis not present

## 2015-08-02 DIAGNOSIS — N186 End stage renal disease: Secondary | ICD-10-CM | POA: Diagnosis not present

## 2015-08-02 DIAGNOSIS — Z992 Dependence on renal dialysis: Secondary | ICD-10-CM | POA: Diagnosis not present

## 2015-08-02 DIAGNOSIS — D509 Iron deficiency anemia, unspecified: Secondary | ICD-10-CM | POA: Diagnosis not present

## 2015-08-04 DIAGNOSIS — N186 End stage renal disease: Secondary | ICD-10-CM | POA: Diagnosis not present

## 2015-08-04 DIAGNOSIS — D509 Iron deficiency anemia, unspecified: Secondary | ICD-10-CM | POA: Diagnosis not present

## 2015-08-04 DIAGNOSIS — N2581 Secondary hyperparathyroidism of renal origin: Secondary | ICD-10-CM | POA: Diagnosis not present

## 2015-08-04 DIAGNOSIS — Z992 Dependence on renal dialysis: Secondary | ICD-10-CM | POA: Diagnosis not present

## 2015-08-05 DIAGNOSIS — N2581 Secondary hyperparathyroidism of renal origin: Secondary | ICD-10-CM | POA: Diagnosis not present

## 2015-08-05 DIAGNOSIS — Z992 Dependence on renal dialysis: Secondary | ICD-10-CM | POA: Diagnosis not present

## 2015-08-05 DIAGNOSIS — E781 Pure hyperglyceridemia: Secondary | ICD-10-CM | POA: Diagnosis not present

## 2015-08-05 DIAGNOSIS — D509 Iron deficiency anemia, unspecified: Secondary | ICD-10-CM | POA: Diagnosis not present

## 2015-08-05 DIAGNOSIS — N186 End stage renal disease: Secondary | ICD-10-CM | POA: Diagnosis not present

## 2015-08-06 DIAGNOSIS — D509 Iron deficiency anemia, unspecified: Secondary | ICD-10-CM | POA: Diagnosis not present

## 2015-08-06 DIAGNOSIS — Z992 Dependence on renal dialysis: Secondary | ICD-10-CM | POA: Diagnosis not present

## 2015-08-06 DIAGNOSIS — N186 End stage renal disease: Secondary | ICD-10-CM | POA: Diagnosis not present

## 2015-08-06 DIAGNOSIS — N2581 Secondary hyperparathyroidism of renal origin: Secondary | ICD-10-CM | POA: Diagnosis not present

## 2015-08-08 DIAGNOSIS — D509 Iron deficiency anemia, unspecified: Secondary | ICD-10-CM | POA: Diagnosis not present

## 2015-08-08 DIAGNOSIS — Z992 Dependence on renal dialysis: Secondary | ICD-10-CM | POA: Diagnosis not present

## 2015-08-08 DIAGNOSIS — N186 End stage renal disease: Secondary | ICD-10-CM | POA: Diagnosis not present

## 2015-08-08 DIAGNOSIS — N2581 Secondary hyperparathyroidism of renal origin: Secondary | ICD-10-CM | POA: Diagnosis not present

## 2015-08-09 DIAGNOSIS — N186 End stage renal disease: Secondary | ICD-10-CM | POA: Diagnosis not present

## 2015-08-09 DIAGNOSIS — D509 Iron deficiency anemia, unspecified: Secondary | ICD-10-CM | POA: Diagnosis not present

## 2015-08-09 DIAGNOSIS — N2581 Secondary hyperparathyroidism of renal origin: Secondary | ICD-10-CM | POA: Diagnosis not present

## 2015-08-09 DIAGNOSIS — Z992 Dependence on renal dialysis: Secondary | ICD-10-CM | POA: Diagnosis not present

## 2015-08-11 DIAGNOSIS — D509 Iron deficiency anemia, unspecified: Secondary | ICD-10-CM | POA: Diagnosis not present

## 2015-08-11 DIAGNOSIS — Z992 Dependence on renal dialysis: Secondary | ICD-10-CM | POA: Diagnosis not present

## 2015-08-11 DIAGNOSIS — N186 End stage renal disease: Secondary | ICD-10-CM | POA: Diagnosis not present

## 2015-08-11 DIAGNOSIS — N2581 Secondary hyperparathyroidism of renal origin: Secondary | ICD-10-CM | POA: Diagnosis not present

## 2015-08-12 DIAGNOSIS — Z992 Dependence on renal dialysis: Secondary | ICD-10-CM | POA: Diagnosis not present

## 2015-08-12 DIAGNOSIS — N2581 Secondary hyperparathyroidism of renal origin: Secondary | ICD-10-CM | POA: Diagnosis not present

## 2015-08-12 DIAGNOSIS — D509 Iron deficiency anemia, unspecified: Secondary | ICD-10-CM | POA: Diagnosis not present

## 2015-08-12 DIAGNOSIS — N186 End stage renal disease: Secondary | ICD-10-CM | POA: Diagnosis not present

## 2015-08-13 DIAGNOSIS — N2581 Secondary hyperparathyroidism of renal origin: Secondary | ICD-10-CM | POA: Diagnosis not present

## 2015-08-13 DIAGNOSIS — Z992 Dependence on renal dialysis: Secondary | ICD-10-CM | POA: Diagnosis not present

## 2015-08-13 DIAGNOSIS — D509 Iron deficiency anemia, unspecified: Secondary | ICD-10-CM | POA: Diagnosis not present

## 2015-08-13 DIAGNOSIS — N186 End stage renal disease: Secondary | ICD-10-CM | POA: Diagnosis not present

## 2015-08-15 DIAGNOSIS — D509 Iron deficiency anemia, unspecified: Secondary | ICD-10-CM | POA: Diagnosis not present

## 2015-08-15 DIAGNOSIS — N2581 Secondary hyperparathyroidism of renal origin: Secondary | ICD-10-CM | POA: Diagnosis not present

## 2015-08-15 DIAGNOSIS — Z992 Dependence on renal dialysis: Secondary | ICD-10-CM | POA: Diagnosis not present

## 2015-08-15 DIAGNOSIS — N186 End stage renal disease: Secondary | ICD-10-CM | POA: Diagnosis not present

## 2015-08-16 DIAGNOSIS — N2581 Secondary hyperparathyroidism of renal origin: Secondary | ICD-10-CM | POA: Diagnosis not present

## 2015-08-16 DIAGNOSIS — Z992 Dependence on renal dialysis: Secondary | ICD-10-CM | POA: Diagnosis not present

## 2015-08-16 DIAGNOSIS — D509 Iron deficiency anemia, unspecified: Secondary | ICD-10-CM | POA: Diagnosis not present

## 2015-08-16 DIAGNOSIS — N186 End stage renal disease: Secondary | ICD-10-CM | POA: Diagnosis not present

## 2015-08-18 DIAGNOSIS — D509 Iron deficiency anemia, unspecified: Secondary | ICD-10-CM | POA: Diagnosis not present

## 2015-08-18 DIAGNOSIS — N186 End stage renal disease: Secondary | ICD-10-CM | POA: Diagnosis not present

## 2015-08-18 DIAGNOSIS — N2581 Secondary hyperparathyroidism of renal origin: Secondary | ICD-10-CM | POA: Diagnosis not present

## 2015-08-18 DIAGNOSIS — Z992 Dependence on renal dialysis: Secondary | ICD-10-CM | POA: Diagnosis not present

## 2015-08-19 DIAGNOSIS — N2581 Secondary hyperparathyroidism of renal origin: Secondary | ICD-10-CM | POA: Diagnosis not present

## 2015-08-19 DIAGNOSIS — Z992 Dependence on renal dialysis: Secondary | ICD-10-CM | POA: Diagnosis not present

## 2015-08-19 DIAGNOSIS — N186 End stage renal disease: Secondary | ICD-10-CM | POA: Diagnosis not present

## 2015-08-19 DIAGNOSIS — D509 Iron deficiency anemia, unspecified: Secondary | ICD-10-CM | POA: Diagnosis not present

## 2015-08-20 DIAGNOSIS — N186 End stage renal disease: Secondary | ICD-10-CM | POA: Diagnosis not present

## 2015-08-20 DIAGNOSIS — D509 Iron deficiency anemia, unspecified: Secondary | ICD-10-CM | POA: Diagnosis not present

## 2015-08-20 DIAGNOSIS — Z992 Dependence on renal dialysis: Secondary | ICD-10-CM | POA: Diagnosis not present

## 2015-08-20 DIAGNOSIS — N2581 Secondary hyperparathyroidism of renal origin: Secondary | ICD-10-CM | POA: Diagnosis not present

## 2015-08-22 DIAGNOSIS — Z992 Dependence on renal dialysis: Secondary | ICD-10-CM | POA: Diagnosis not present

## 2015-08-22 DIAGNOSIS — D509 Iron deficiency anemia, unspecified: Secondary | ICD-10-CM | POA: Diagnosis not present

## 2015-08-22 DIAGNOSIS — N2581 Secondary hyperparathyroidism of renal origin: Secondary | ICD-10-CM | POA: Diagnosis not present

## 2015-08-22 DIAGNOSIS — N186 End stage renal disease: Secondary | ICD-10-CM | POA: Diagnosis not present

## 2015-08-23 ENCOUNTER — Ambulatory Visit (INDEPENDENT_AMBULATORY_CARE_PROVIDER_SITE_OTHER): Payer: Medicare Other | Admitting: Internal Medicine

## 2015-08-23 ENCOUNTER — Encounter: Payer: Self-pay | Admitting: Internal Medicine

## 2015-08-23 VITALS — BP 138/62 | HR 86 | Ht 64.0 in | Wt 111.6 lb

## 2015-08-23 DIAGNOSIS — F32A Depression, unspecified: Secondary | ICD-10-CM

## 2015-08-23 DIAGNOSIS — K580 Irritable bowel syndrome with diarrhea: Secondary | ICD-10-CM

## 2015-08-23 DIAGNOSIS — R2689 Other abnormalities of gait and mobility: Secondary | ICD-10-CM | POA: Diagnosis not present

## 2015-08-23 DIAGNOSIS — F329 Major depressive disorder, single episode, unspecified: Secondary | ICD-10-CM

## 2015-08-23 DIAGNOSIS — D509 Iron deficiency anemia, unspecified: Secondary | ICD-10-CM | POA: Diagnosis not present

## 2015-08-23 DIAGNOSIS — N185 Chronic kidney disease, stage 5: Secondary | ICD-10-CM

## 2015-08-23 DIAGNOSIS — Z992 Dependence on renal dialysis: Secondary | ICD-10-CM | POA: Diagnosis not present

## 2015-08-23 DIAGNOSIS — I10 Essential (primary) hypertension: Secondary | ICD-10-CM

## 2015-08-23 DIAGNOSIS — N186 End stage renal disease: Secondary | ICD-10-CM | POA: Diagnosis not present

## 2015-08-23 DIAGNOSIS — N2581 Secondary hyperparathyroidism of renal origin: Secondary | ICD-10-CM | POA: Diagnosis not present

## 2015-08-23 NOTE — Progress Notes (Signed)
Pre visit review using our clinic review tool, if applicable. No additional management support is needed unless otherwise documented below in the visit note. 

## 2015-08-23 NOTE — Patient Instructions (Addendum)
Follow up in 4 weeks with Dr. Caryl Bis.

## 2015-08-23 NOTE — Assessment & Plan Note (Signed)
BP Readings from Last 3 Encounters:  08/23/15 138/62  07/15/15 (!) 145/73  06/25/15 111/67   BP well controlled. Continues on PD. Continue Toprol.

## 2015-08-23 NOTE — Assessment & Plan Note (Signed)
Reviewed notes from Dr. Allen Norris. Will continue prn Lomotil.

## 2015-08-23 NOTE — Assessment & Plan Note (Signed)
Symptoms well controlled on Sertraline. Will continue.

## 2015-08-23 NOTE — Progress Notes (Signed)
Subjective:    Patient ID: Sharon [name], female    DOB: 05/05/1926, 82 y.o.   MRN: [id number]  HPI  82YO female presents for follow up.  No major changes recently. Generally feeling well.  Continues to work on balance exercises at home. No recent falls. Using Zarin Hagmann.  Having some issues with swelling in legs. Seen by nephrologist, but no changes made. Continues on PD. Planning to try to pull off more fluid with PD given more swelling.  HTN - Checks BP daily. Mostly near [id number] systolic. Highest 160s.  Seen by Dr. Roselyn Reef. Started on Lomotil prn for diarrhea as needed. She notes some improvement with this. Takes no more than 1, because this results in constipation.  Wt Readings from Last 3 Encounters:  08/23/15 111 lb 9.6 oz (50.6 kg)  07/15/15 108 lb (49 kg)  06/25/15 111 lb (50.3 kg)   BP Readings from Last 3 Encounters:  08/23/15 138/62  07/15/15 (!) 145/73  06/25/15 111/67    Past Medical History:  Diagnosis Date  . Kidney failure July 2012  . Kidney failure   . Meniere disease   . Meniere's disease   . Peritoneal dialysis status [facility])    Family History  Problem Relation Age of Onset  . Cancer Mother     ? ovarian - sarcoma  . Cancer Father     Skin cancer  . Diabetes Sister   . COPD Sister   . Depression Sister   . Cancer Sister     Lung - 39 yrs old   Past Surgical History:  Procedure Laterality Date  . [place]  . CATARACT EXTRACTION  2006, 2011  . CHOLECYSTECTOMY  01/2008  . INSERTION OF DIALYSIS CATHETER  07/2010  . PACEMAKER INSERTION  2006  . TOTAL KNEE ARTHROPLASTY  2008   LEFT/Dr Calif   Social History   Social History  . Marital status: Widowed    Spouse name: N/A  . Number of children: 0  . Years of education: N/A   Occupational History  . Retired     Social History Main Topics  . Smoking status: Former Smoker    Quit date: 03/31/1948  . Smokeless tobacco: Never Used  . Alcohol use No  . Drug use: No  .  Sexual activity: Not Asked   Other Topics Concern  . None   Social History Narrative   Lives in [place] alone. Widow 2012.      Regular Exercise -  Housework   Daily Caffeine Use:  1 - 2 cups coffee             Review of Systems  Constitutional: Negative for appetite change, chills, fatigue, fever and unexpected weight change.  Eyes: Negative for visual disturbance.  Respiratory: Negative for shortness of breath.   Cardiovascular: Positive for leg swelling. Negative for chest pain.  Gastrointestinal: Negative for abdominal pain, constipation, diarrhea and nausea.  Musculoskeletal: Positive for gait problem. Negative for arthralgias and myalgias.  Skin: Negative for color change and rash.  Neurological: Positive for weakness.  Hematological: Negative for adenopathy. Does not bruise/bleed easily.  Psychiatric/Behavioral: Negative for dysphoric mood. The patient is not nervous/anxious.        Objective:    BP 138/62 (BP Location: Left Arm, Patient Position: Sitting, Cuff Size: Normal)   Pulse 86   Ht 5\' 4"  (1.626 m)   Wt 111 lb 9.6 oz (50.6 kg)   SpO2 98%   BMI 19.16  kg/m  Physical Exam  Constitutional: She is oriented to person, place, and time. She appears well-developed and well-nourished. No distress.  HENT:  Head: Normocephalic and atraumatic.  Right Ear: External ear normal.  Left Ear: External ear normal.  Nose: Nose normal.  Mouth/Throat: Oropharynx is clear and moist. No oropharyngeal exudate.  Eyes: Conjunctivae are normal. Pupils are equal, round, and reactive to light. Right eye exhibits no discharge. Left eye exhibits no discharge. No scleral icterus.  Neck: Normal range of motion. Neck supple. No tracheal deviation present. No thyromegaly present.  Cardiovascular: Normal rate, regular rhythm, normal heart sounds and intact distal pulses.  Exam reveals no gallop and no friction rub.   No murmur heard. Pulmonary/Chest: Effort normal and breath sounds  normal. No respiratory distress. She has no wheezes. She has no rales. She exhibits no tenderness.  Musculoskeletal: Normal range of motion. She exhibits edema (trace bilateral LE). She exhibits no tenderness.  Lymphadenopathy:    She has no cervical adenopathy.  Neurological: She is alert and oriented to person, place, and time. No cranial nerve deficit. She exhibits normal muscle tone. Coordination normal.  Skin: Skin is warm and dry. No rash noted. She is not diaphoretic. No erythema. No pallor.  Psychiatric: She has a normal mood and affect. Her behavior is normal. Judgment and thought content normal.          Assessment & Plan:   Problem List Items Addressed This Visit      Unprioritized   Chronic kidney disease, stage 5, kidney failure ([place]) (Chronic)    Some recent issues with increased fluid in lower legs. %Dextrose in PD increased by nephrology. Will continue to monitor.      Depression (Chronic)    Symptoms well controlled on Sertraline. Will continue.      Hypertension - Primary (Chronic)    BP Readings from Last 3 Encounters:  08/23/15 138/62  07/15/15 (!) 145/73  06/25/15 111/67   BP well controlled. Continues on PD. Continue Toprol.      Imbalance   Irritable bowel syndrome    Reviewed notes from Dr. Allen Norris. Will continue prn Lomotil.       Other Visit Diagnoses   None.      Return in about 4 weeks (around 09/20/2015) for New Patient.  Ronette Deter, MD [facility]

## 2015-08-23 NOTE — Assessment & Plan Note (Signed)
Some recent issues with increased fluid in lower legs. %Dextrose in PD increased by nephrology. Will continue to monitor.

## 2015-08-25 DIAGNOSIS — N186 End stage renal disease: Secondary | ICD-10-CM | POA: Diagnosis not present

## 2015-08-25 DIAGNOSIS — N2581 Secondary hyperparathyroidism of renal origin: Secondary | ICD-10-CM | POA: Diagnosis not present

## 2015-08-25 DIAGNOSIS — D509 Iron deficiency anemia, unspecified: Secondary | ICD-10-CM | POA: Diagnosis not present

## 2015-08-25 DIAGNOSIS — Z992 Dependence on renal dialysis: Secondary | ICD-10-CM | POA: Diagnosis not present

## 2015-08-26 DIAGNOSIS — N2581 Secondary hyperparathyroidism of renal origin: Secondary | ICD-10-CM | POA: Diagnosis not present

## 2015-08-26 DIAGNOSIS — D509 Iron deficiency anemia, unspecified: Secondary | ICD-10-CM | POA: Diagnosis not present

## 2015-08-26 DIAGNOSIS — Z992 Dependence on renal dialysis: Secondary | ICD-10-CM | POA: Diagnosis not present

## 2015-08-26 DIAGNOSIS — N186 End stage renal disease: Secondary | ICD-10-CM | POA: Diagnosis not present

## 2015-08-27 DIAGNOSIS — N186 End stage renal disease: Secondary | ICD-10-CM | POA: Diagnosis not present

## 2015-08-27 DIAGNOSIS — R109 Unspecified abdominal pain: Secondary | ICD-10-CM | POA: Diagnosis not present

## 2015-08-27 DIAGNOSIS — D509 Iron deficiency anemia, unspecified: Secondary | ICD-10-CM | POA: Diagnosis not present

## 2015-08-27 DIAGNOSIS — Z992 Dependence on renal dialysis: Secondary | ICD-10-CM | POA: Diagnosis not present

## 2015-08-27 DIAGNOSIS — R509 Fever, unspecified: Secondary | ICD-10-CM | POA: Diagnosis not present

## 2015-08-29 ENCOUNTER — Other Ambulatory Visit: Payer: Self-pay | Admitting: Internal Medicine

## 2015-08-29 ENCOUNTER — Other Ambulatory Visit: Payer: Self-pay | Admitting: Gastroenterology

## 2015-08-29 DIAGNOSIS — Z992 Dependence on renal dialysis: Secondary | ICD-10-CM | POA: Diagnosis not present

## 2015-08-30 ENCOUNTER — Other Ambulatory Visit: Payer: Self-pay | Admitting: Internal Medicine

## 2015-08-30 ENCOUNTER — Telehealth: Payer: Self-pay | Admitting: Gastroenterology

## 2015-08-30 NOTE — Telephone Encounter (Signed)
Patient needs a refill on Lomotyl 2.5 mg She uses [facility] in [place].

## 2015-08-30 NOTE — Telephone Encounter (Signed)
Please determine how much patient has left and get her scheduled for a follow-up with me for refill. Thanks.

## 2015-08-30 NOTE — Telephone Encounter (Signed)
Refill request for Tramadol, last seen 28Jul2017, last filled [id number].  Please advise.  Past Walker patient

## 2015-08-30 NOTE — Telephone Encounter (Signed)
Left message for patient to return call.

## 2015-09-02 NOTE — Telephone Encounter (Signed)
Rx has already been sent to pt's pharmacy.

## 2015-09-04 NOTE — Telephone Encounter (Signed)
Noted  

## 2015-09-04 NOTE — Telephone Encounter (Signed)
Please call patient back regarding this message.

## 2015-09-04 NOTE — Telephone Encounter (Signed)
Patient stated she has ten tabs left.  Patient says she will be able to wait for refill, when she comes to see you in September.  Patient will discuss refill at that time.

## 2015-09-18 DIAGNOSIS — I2510 Atherosclerotic heart disease of native coronary artery without angina pectoris: Secondary | ICD-10-CM | POA: Diagnosis not present

## 2015-09-18 DIAGNOSIS — G4733 Obstructive sleep apnea (adult) (pediatric): Secondary | ICD-10-CM | POA: Diagnosis not present

## 2015-09-18 DIAGNOSIS — E782 Mixed hyperlipidemia: Secondary | ICD-10-CM | POA: Diagnosis not present

## 2015-09-18 DIAGNOSIS — I10 Essential (primary) hypertension: Secondary | ICD-10-CM | POA: Diagnosis not present

## 2015-09-18 DIAGNOSIS — I5022 Chronic systolic (congestive) heart failure: Secondary | ICD-10-CM | POA: Diagnosis not present

## 2015-09-26 DIAGNOSIS — Z992 Dependence on renal dialysis: Secondary | ICD-10-CM | POA: Diagnosis not present

## 2015-09-26 DIAGNOSIS — N186 End stage renal disease: Secondary | ICD-10-CM | POA: Diagnosis not present

## 2015-09-27 DIAGNOSIS — N186 End stage renal disease: Secondary | ICD-10-CM | POA: Diagnosis not present

## 2015-09-27 DIAGNOSIS — Z23 Encounter for immunization: Secondary | ICD-10-CM | POA: Diagnosis not present

## 2015-09-27 DIAGNOSIS — D509 Iron deficiency anemia, unspecified: Secondary | ICD-10-CM | POA: Diagnosis not present

## 2015-09-27 DIAGNOSIS — Z992 Dependence on renal dialysis: Secondary | ICD-10-CM | POA: Diagnosis not present

## 2015-09-29 DIAGNOSIS — N186 End stage renal disease: Secondary | ICD-10-CM | POA: Diagnosis not present

## 2015-09-29 DIAGNOSIS — Z23 Encounter for immunization: Secondary | ICD-10-CM | POA: Diagnosis not present

## 2015-09-29 DIAGNOSIS — Z992 Dependence on renal dialysis: Secondary | ICD-10-CM | POA: Diagnosis not present

## 2015-09-29 DIAGNOSIS — D509 Iron deficiency anemia, unspecified: Secondary | ICD-10-CM | POA: Diagnosis not present

## 2015-09-30 DIAGNOSIS — D509 Iron deficiency anemia, unspecified: Secondary | ICD-10-CM | POA: Diagnosis not present

## 2015-09-30 DIAGNOSIS — Z23 Encounter for immunization: Secondary | ICD-10-CM | POA: Diagnosis not present

## 2015-09-30 DIAGNOSIS — Z992 Dependence on renal dialysis: Secondary | ICD-10-CM | POA: Diagnosis not present

## 2015-09-30 DIAGNOSIS — N186 End stage renal disease: Secondary | ICD-10-CM | POA: Diagnosis not present

## 2015-10-01 DIAGNOSIS — D509 Iron deficiency anemia, unspecified: Secondary | ICD-10-CM | POA: Diagnosis not present

## 2015-10-01 DIAGNOSIS — N186 End stage renal disease: Secondary | ICD-10-CM | POA: Diagnosis not present

## 2015-10-01 DIAGNOSIS — Z23 Encounter for immunization: Secondary | ICD-10-CM | POA: Diagnosis not present

## 2015-10-01 DIAGNOSIS — Z992 Dependence on renal dialysis: Secondary | ICD-10-CM | POA: Diagnosis not present

## 2015-10-02 DIAGNOSIS — D509 Iron deficiency anemia, unspecified: Secondary | ICD-10-CM | POA: Diagnosis not present

## 2015-10-02 DIAGNOSIS — N186 End stage renal disease: Secondary | ICD-10-CM | POA: Diagnosis not present

## 2015-10-02 DIAGNOSIS — Z992 Dependence on renal dialysis: Secondary | ICD-10-CM | POA: Diagnosis not present

## 2015-10-02 DIAGNOSIS — Z23 Encounter for immunization: Secondary | ICD-10-CM | POA: Diagnosis not present

## 2015-10-03 DIAGNOSIS — D509 Iron deficiency anemia, unspecified: Secondary | ICD-10-CM | POA: Diagnosis not present

## 2015-10-03 DIAGNOSIS — N186 End stage renal disease: Secondary | ICD-10-CM | POA: Diagnosis not present

## 2015-10-03 DIAGNOSIS — Z23 Encounter for immunization: Secondary | ICD-10-CM | POA: Diagnosis not present

## 2015-10-03 DIAGNOSIS — Z992 Dependence on renal dialysis: Secondary | ICD-10-CM | POA: Diagnosis not present

## 2015-10-04 DIAGNOSIS — N186 End stage renal disease: Secondary | ICD-10-CM | POA: Diagnosis not present

## 2015-10-04 DIAGNOSIS — Z23 Encounter for immunization: Secondary | ICD-10-CM | POA: Diagnosis not present

## 2015-10-04 DIAGNOSIS — D509 Iron deficiency anemia, unspecified: Secondary | ICD-10-CM | POA: Diagnosis not present

## 2015-10-04 DIAGNOSIS — Z992 Dependence on renal dialysis: Secondary | ICD-10-CM | POA: Diagnosis not present

## 2015-10-06 DIAGNOSIS — N186 End stage renal disease: Secondary | ICD-10-CM | POA: Diagnosis not present

## 2015-10-06 DIAGNOSIS — D509 Iron deficiency anemia, unspecified: Secondary | ICD-10-CM | POA: Diagnosis not present

## 2015-10-06 DIAGNOSIS — Z992 Dependence on renal dialysis: Secondary | ICD-10-CM | POA: Diagnosis not present

## 2015-10-06 DIAGNOSIS — Z23 Encounter for immunization: Secondary | ICD-10-CM | POA: Diagnosis not present

## 2015-10-07 DIAGNOSIS — Z23 Encounter for immunization: Secondary | ICD-10-CM | POA: Diagnosis not present

## 2015-10-07 DIAGNOSIS — D509 Iron deficiency anemia, unspecified: Secondary | ICD-10-CM | POA: Diagnosis not present

## 2015-10-07 DIAGNOSIS — N186 End stage renal disease: Secondary | ICD-10-CM | POA: Diagnosis not present

## 2015-10-07 DIAGNOSIS — Z992 Dependence on renal dialysis: Secondary | ICD-10-CM | POA: Diagnosis not present

## 2015-10-08 DIAGNOSIS — Z23 Encounter for immunization: Secondary | ICD-10-CM | POA: Diagnosis not present

## 2015-10-08 DIAGNOSIS — D509 Iron deficiency anemia, unspecified: Secondary | ICD-10-CM | POA: Diagnosis not present

## 2015-10-08 DIAGNOSIS — N186 End stage renal disease: Secondary | ICD-10-CM | POA: Diagnosis not present

## 2015-10-08 DIAGNOSIS — Z992 Dependence on renal dialysis: Secondary | ICD-10-CM | POA: Diagnosis not present

## 2015-10-09 DIAGNOSIS — N186 End stage renal disease: Secondary | ICD-10-CM | POA: Diagnosis not present

## 2015-10-09 DIAGNOSIS — D509 Iron deficiency anemia, unspecified: Secondary | ICD-10-CM | POA: Diagnosis not present

## 2015-10-09 DIAGNOSIS — Z23 Encounter for immunization: Secondary | ICD-10-CM | POA: Diagnosis not present

## 2015-10-09 DIAGNOSIS — Z992 Dependence on renal dialysis: Secondary | ICD-10-CM | POA: Diagnosis not present

## 2015-10-10 DIAGNOSIS — Z23 Encounter for immunization: Secondary | ICD-10-CM | POA: Diagnosis not present

## 2015-10-10 DIAGNOSIS — Z992 Dependence on renal dialysis: Secondary | ICD-10-CM | POA: Diagnosis not present

## 2015-10-10 DIAGNOSIS — D509 Iron deficiency anemia, unspecified: Secondary | ICD-10-CM | POA: Diagnosis not present

## 2015-10-10 DIAGNOSIS — N186 End stage renal disease: Secondary | ICD-10-CM | POA: Diagnosis not present

## 2015-10-11 DIAGNOSIS — N186 End stage renal disease: Secondary | ICD-10-CM | POA: Diagnosis not present

## 2015-10-11 DIAGNOSIS — D509 Iron deficiency anemia, unspecified: Secondary | ICD-10-CM | POA: Diagnosis not present

## 2015-10-11 DIAGNOSIS — Z992 Dependence on renal dialysis: Secondary | ICD-10-CM | POA: Diagnosis not present

## 2015-10-11 DIAGNOSIS — Z23 Encounter for immunization: Secondary | ICD-10-CM | POA: Diagnosis not present

## 2015-10-13 DIAGNOSIS — D509 Iron deficiency anemia, unspecified: Secondary | ICD-10-CM | POA: Diagnosis not present

## 2015-10-13 DIAGNOSIS — Z992 Dependence on renal dialysis: Secondary | ICD-10-CM | POA: Diagnosis not present

## 2015-10-13 DIAGNOSIS — N186 End stage renal disease: Secondary | ICD-10-CM | POA: Diagnosis not present

## 2015-10-13 DIAGNOSIS — Z23 Encounter for immunization: Secondary | ICD-10-CM | POA: Diagnosis not present

## 2015-10-14 DIAGNOSIS — D509 Iron deficiency anemia, unspecified: Secondary | ICD-10-CM | POA: Diagnosis not present

## 2015-10-14 DIAGNOSIS — Z992 Dependence on renal dialysis: Secondary | ICD-10-CM | POA: Diagnosis not present

## 2015-10-14 DIAGNOSIS — Z23 Encounter for immunization: Secondary | ICD-10-CM | POA: Diagnosis not present

## 2015-10-14 DIAGNOSIS — N186 End stage renal disease: Secondary | ICD-10-CM | POA: Diagnosis not present

## 2015-10-15 DIAGNOSIS — N186 End stage renal disease: Secondary | ICD-10-CM | POA: Diagnosis not present

## 2015-10-15 DIAGNOSIS — D509 Iron deficiency anemia, unspecified: Secondary | ICD-10-CM | POA: Diagnosis not present

## 2015-10-15 DIAGNOSIS — Z992 Dependence on renal dialysis: Secondary | ICD-10-CM | POA: Diagnosis not present

## 2015-10-15 DIAGNOSIS — Z23 Encounter for immunization: Secondary | ICD-10-CM | POA: Diagnosis not present

## 2015-10-16 DIAGNOSIS — Z23 Encounter for immunization: Secondary | ICD-10-CM | POA: Diagnosis not present

## 2015-10-16 DIAGNOSIS — D509 Iron deficiency anemia, unspecified: Secondary | ICD-10-CM | POA: Diagnosis not present

## 2015-10-16 DIAGNOSIS — Z992 Dependence on renal dialysis: Secondary | ICD-10-CM | POA: Diagnosis not present

## 2015-10-16 DIAGNOSIS — N186 End stage renal disease: Secondary | ICD-10-CM | POA: Diagnosis not present

## 2015-10-17 DIAGNOSIS — N186 End stage renal disease: Secondary | ICD-10-CM | POA: Diagnosis not present

## 2015-10-17 DIAGNOSIS — Z992 Dependence on renal dialysis: Secondary | ICD-10-CM | POA: Diagnosis not present

## 2015-10-17 DIAGNOSIS — Z23 Encounter for immunization: Secondary | ICD-10-CM | POA: Diagnosis not present

## 2015-10-17 DIAGNOSIS — D509 Iron deficiency anemia, unspecified: Secondary | ICD-10-CM | POA: Diagnosis not present

## 2015-10-18 DIAGNOSIS — Z23 Encounter for immunization: Secondary | ICD-10-CM | POA: Diagnosis not present

## 2015-10-18 DIAGNOSIS — Z992 Dependence on renal dialysis: Secondary | ICD-10-CM | POA: Diagnosis not present

## 2015-10-18 DIAGNOSIS — N186 End stage renal disease: Secondary | ICD-10-CM | POA: Diagnosis not present

## 2015-10-18 DIAGNOSIS — D509 Iron deficiency anemia, unspecified: Secondary | ICD-10-CM | POA: Diagnosis not present

## 2015-10-20 DIAGNOSIS — Z23 Encounter for immunization: Secondary | ICD-10-CM | POA: Diagnosis not present

## 2015-10-20 DIAGNOSIS — N186 End stage renal disease: Secondary | ICD-10-CM | POA: Diagnosis not present

## 2015-10-20 DIAGNOSIS — Z992 Dependence on renal dialysis: Secondary | ICD-10-CM | POA: Diagnosis not present

## 2015-10-20 DIAGNOSIS — D509 Iron deficiency anemia, unspecified: Secondary | ICD-10-CM | POA: Diagnosis not present

## 2015-10-21 ENCOUNTER — Encounter: Payer: Self-pay | Admitting: Family Medicine

## 2015-10-21 ENCOUNTER — Ambulatory Visit (INDEPENDENT_AMBULATORY_CARE_PROVIDER_SITE_OTHER): Payer: Medicare Other | Admitting: Family Medicine

## 2015-10-21 VITALS — BP 126/70 | HR 81 | Temp 97.9°F | Wt 109.0 lb

## 2015-10-21 DIAGNOSIS — D509 Iron deficiency anemia, unspecified: Secondary | ICD-10-CM | POA: Diagnosis not present

## 2015-10-21 DIAGNOSIS — G894 Chronic pain syndrome: Secondary | ICD-10-CM | POA: Diagnosis not present

## 2015-10-21 DIAGNOSIS — R634 Abnormal weight loss: Secondary | ICD-10-CM | POA: Insufficient documentation

## 2015-10-21 DIAGNOSIS — Z992 Dependence on renal dialysis: Secondary | ICD-10-CM | POA: Diagnosis not present

## 2015-10-21 DIAGNOSIS — S51012A Laceration without foreign body of left elbow, initial encounter: Secondary | ICD-10-CM

## 2015-10-21 DIAGNOSIS — Z23 Encounter for immunization: Secondary | ICD-10-CM | POA: Diagnosis not present

## 2015-10-21 DIAGNOSIS — N186 End stage renal disease: Secondary | ICD-10-CM | POA: Diagnosis not present

## 2015-10-21 DIAGNOSIS — S51019A Laceration without foreign body of unspecified elbow, initial encounter: Secondary | ICD-10-CM | POA: Insufficient documentation

## 2015-10-21 LAB — TSH: TSH: 9.77 u[IU]/mL — AB (ref 0.35–4.50)

## 2015-10-21 MED ORDER — TRAMADOL HCL 50 MG PO TABS: ORAL_TABLET | ORAL | 0 refills | Status: DC

## 2015-10-21 NOTE — Assessment & Plan Note (Signed)
Tramadol refilled.

## 2015-10-21 NOTE — Progress Notes (Signed)
[name], MD Phone: [phone]  AYRA FALCONER is a 82 y.o. female who presents today for follow-up.  Patient notes complaints of thin skin with easily tearing her skin. Notes some mild easy bruising. She notes if she bumps anything she gets a cut easily. No aspirin. No blood thinner use. She is going to see dermatology in about a month.  Weight loss: Patient notes she is down about 20 pounds since December of last year. On review of prior notes it appears that she is down about 14-15 pounds over the last year, though weight has been stable for the last 5-6 months. She reports her nephrologist has her doing protein supplements to help with her weight. She's been on peritoneal dialysis over that time. She's been losing weight since starting this. Some depression though she notes Zoloft helps. No SI. Notes a history of skin cancer though no other cancers. Status post hysterectomy and BSO. Colonoscopy 6-7 years ago and also had a sigmoidoscopy several years ago with no polyps. Has chronic diarrhea related to IBS and is on Lomotil. No mammograms recently. No night sweats. Does note some memory issues with short-term memory though she does care for herself in every aspect.  She also needs a refill on her tramadol for her chronic pain.  PMH: Former smoker.   ROS see history of present illness  Objective  Physical Exam Vitals:   10/21/15 1335  BP: 126/70  Pulse: 81  Temp: 97.9 F (36.6 C)    BP Readings from Last 3 Encounters:  10/21/15 126/70  08/23/15 138/62  07/15/15 (!) 145/73   Wt Readings from Last 3 Encounters:  10/21/15 109 lb (49.4 kg)  08/23/15 111 lb 9.6 oz (50.6 kg)  07/15/15 108 lb (49 kg)    Physical Exam  Constitutional: No distress.  HENT:  Head: Normocephalic and atraumatic.  Cardiovascular: Normal rate, regular rhythm and normal heart sounds.   Pulmonary/Chest: Effort normal.  Musculoskeletal: She exhibits no edema.  Neurological: She is alert. Gait  normal.  Skin: Skin is warm and dry. She is not diaphoretic.  Single small abrasion on left forearm, small bruise on left forearm as well, no other bruising evident on bilateral arms     Assessment/Plan: Please see individual problem list.  Chronic pain syndrome Tramadol refilled.  Loss of weight Patient with mild to moderate amount of weight loss over the last year or so. Has stabilized over the last 5-6 months. Could be related to her chronic medical issues since it started following peritoneal dialysis. Could be related to depression though she reports this is well controlled. For her age she is up-to-date on cancer screening. Recent lab work from her nephrologist including CBC and CMP reviewed. We will obtain a TSH. She'll continue with her meal supplements. She will continue to monitor her weight. If has further weight loss would evaluate further.  Skin tear of elbow without complication Mild skin tear in her left elbow appears to be well healing at this time. Discussed that as people age their skin becomes thinner and they also bruise more easily. Discussed being careful not to bump into things and monitor for persistent bleeding if she does get skin tears. She'll monitor and follow up if she has persistent issues with this.   Orders Placed This Encounter  Procedures  . TSH    Meds ordered this encounter  Medications  . traMADol (ULTRAM) 50 MG tablet    Sig: TAKE 1/2- 1 TABLET BY MOUTH THREE TIMES  DAILY AS NEEDED    Dispense:  60 tablet    Refill:  0    [name], MD [place]

## 2015-10-21 NOTE — Patient Instructions (Signed)
Nice to see you. We will check your thyroid to evaluate your weight loss. Please continue your meal supplementation per your nephrologist. Please try to avoid bumping into things as this can cause bruising and skin tears. We will see back in 2 months to evaluate her weight.

## 2015-10-21 NOTE — Assessment & Plan Note (Signed)
Patient with mild to moderate amount of weight loss over the last year or so. Has stabilized over the last 5-6 months. Could be related to her chronic medical issues since it started following peritoneal dialysis. Could be related to depression though she reports this is well controlled. For her age she is up-to-date on cancer screening. Recent lab work from her nephrologist including CBC and CMP reviewed. We will obtain a TSH. She'll continue with her meal supplements. She will continue to monitor her weight. If has further weight loss would evaluate further.

## 2015-10-21 NOTE — Assessment & Plan Note (Signed)
Mild skin tear in her left elbow appears to be well healing at this time. Discussed that as people age their skin becomes thinner and they also bruise more easily. Discussed being careful not to bump into things and monitor for persistent bleeding if she does get skin tears. She'll monitor and follow up if she has persistent issues with this.

## 2015-10-21 NOTE — Progress Notes (Signed)
Pre visit review using our clinic review tool, if applicable. No additional management support is needed unless otherwise documented below in the visit note. 

## 2015-10-22 ENCOUNTER — Other Ambulatory Visit (INDEPENDENT_AMBULATORY_CARE_PROVIDER_SITE_OTHER): Payer: Medicare Other

## 2015-10-22 ENCOUNTER — Other Ambulatory Visit: Payer: Self-pay

## 2015-10-22 DIAGNOSIS — Z23 Encounter for immunization: Secondary | ICD-10-CM | POA: Diagnosis not present

## 2015-10-22 DIAGNOSIS — R946 Abnormal results of thyroid function studies: Secondary | ICD-10-CM | POA: Diagnosis not present

## 2015-10-22 DIAGNOSIS — D509 Iron deficiency anemia, unspecified: Secondary | ICD-10-CM | POA: Diagnosis not present

## 2015-10-22 DIAGNOSIS — Z992 Dependence on renal dialysis: Secondary | ICD-10-CM | POA: Diagnosis not present

## 2015-10-22 DIAGNOSIS — R7989 Other specified abnormal findings of blood chemistry: Secondary | ICD-10-CM

## 2015-10-22 DIAGNOSIS — N186 End stage renal disease: Secondary | ICD-10-CM | POA: Diagnosis not present

## 2015-10-22 LAB — T4, FREE: FREE T4: 0.61 ng/dL (ref 0.60–1.60)

## 2015-10-23 DIAGNOSIS — N186 End stage renal disease: Secondary | ICD-10-CM | POA: Diagnosis not present

## 2015-10-23 DIAGNOSIS — Z992 Dependence on renal dialysis: Secondary | ICD-10-CM | POA: Diagnosis not present

## 2015-10-23 DIAGNOSIS — Z23 Encounter for immunization: Secondary | ICD-10-CM | POA: Diagnosis not present

## 2015-10-23 DIAGNOSIS — D509 Iron deficiency anemia, unspecified: Secondary | ICD-10-CM | POA: Diagnosis not present

## 2015-10-24 DIAGNOSIS — Z23 Encounter for immunization: Secondary | ICD-10-CM | POA: Diagnosis not present

## 2015-10-24 DIAGNOSIS — Z992 Dependence on renal dialysis: Secondary | ICD-10-CM | POA: Diagnosis not present

## 2015-10-24 DIAGNOSIS — N186 End stage renal disease: Secondary | ICD-10-CM | POA: Diagnosis not present

## 2015-10-24 DIAGNOSIS — D509 Iron deficiency anemia, unspecified: Secondary | ICD-10-CM | POA: Diagnosis not present

## 2015-10-25 ENCOUNTER — Other Ambulatory Visit: Payer: Self-pay | Admitting: Family Medicine

## 2015-10-25 DIAGNOSIS — Z23 Encounter for immunization: Secondary | ICD-10-CM | POA: Diagnosis not present

## 2015-10-25 DIAGNOSIS — N186 End stage renal disease: Secondary | ICD-10-CM | POA: Diagnosis not present

## 2015-10-25 DIAGNOSIS — Z992 Dependence on renal dialysis: Secondary | ICD-10-CM | POA: Diagnosis not present

## 2015-10-25 DIAGNOSIS — D509 Iron deficiency anemia, unspecified: Secondary | ICD-10-CM | POA: Diagnosis not present

## 2015-10-25 MED ORDER — LEVOTHYROXINE SODIUM 25 MCG PO TABS: 25.0000 ug | ORAL_TABLET | Freq: Every day | ORAL | 1 refills | Status: DC

## 2015-10-26 DIAGNOSIS — Z992 Dependence on renal dialysis: Secondary | ICD-10-CM | POA: Diagnosis not present

## 2015-10-26 DIAGNOSIS — N186 End stage renal disease: Secondary | ICD-10-CM | POA: Diagnosis not present

## 2015-10-27 DIAGNOSIS — B9689 Other specified bacterial agents as the cause of diseases classified elsewhere: Secondary | ICD-10-CM | POA: Diagnosis not present

## 2015-10-27 DIAGNOSIS — Z992 Dependence on renal dialysis: Secondary | ICD-10-CM | POA: Diagnosis not present

## 2015-10-27 DIAGNOSIS — N2581 Secondary hyperparathyroidism of renal origin: Secondary | ICD-10-CM | POA: Diagnosis not present

## 2015-10-27 DIAGNOSIS — N186 End stage renal disease: Secondary | ICD-10-CM | POA: Diagnosis not present

## 2015-10-27 DIAGNOSIS — T8571XA Infection and inflammatory reaction due to peritoneal dialysis catheter, initial encounter: Secondary | ICD-10-CM | POA: Diagnosis not present

## 2015-10-27 DIAGNOSIS — D509 Iron deficiency anemia, unspecified: Secondary | ICD-10-CM | POA: Diagnosis not present

## 2015-10-28 DIAGNOSIS — N186 End stage renal disease: Secondary | ICD-10-CM | POA: Diagnosis not present

## 2015-10-28 DIAGNOSIS — T8571XA Infection and inflammatory reaction due to peritoneal dialysis catheter, initial encounter: Secondary | ICD-10-CM | POA: Diagnosis not present

## 2015-10-28 DIAGNOSIS — N2581 Secondary hyperparathyroidism of renal origin: Secondary | ICD-10-CM | POA: Diagnosis not present

## 2015-10-28 DIAGNOSIS — B9689 Other specified bacterial agents as the cause of diseases classified elsewhere: Secondary | ICD-10-CM | POA: Diagnosis not present

## 2015-10-28 DIAGNOSIS — D509 Iron deficiency anemia, unspecified: Secondary | ICD-10-CM | POA: Diagnosis not present

## 2015-10-28 DIAGNOSIS — Z992 Dependence on renal dialysis: Secondary | ICD-10-CM | POA: Diagnosis not present

## 2015-10-29 DIAGNOSIS — D509 Iron deficiency anemia, unspecified: Secondary | ICD-10-CM | POA: Diagnosis not present

## 2015-10-29 DIAGNOSIS — B9689 Other specified bacterial agents as the cause of diseases classified elsewhere: Secondary | ICD-10-CM | POA: Diagnosis not present

## 2015-10-29 DIAGNOSIS — Z992 Dependence on renal dialysis: Secondary | ICD-10-CM | POA: Diagnosis not present

## 2015-10-29 DIAGNOSIS — N2581 Secondary hyperparathyroidism of renal origin: Secondary | ICD-10-CM | POA: Diagnosis not present

## 2015-10-29 DIAGNOSIS — T8571XA Infection and inflammatory reaction due to peritoneal dialysis catheter, initial encounter: Secondary | ICD-10-CM | POA: Diagnosis not present

## 2015-10-29 DIAGNOSIS — N186 End stage renal disease: Secondary | ICD-10-CM | POA: Diagnosis not present

## 2015-10-30 DIAGNOSIS — N186 End stage renal disease: Secondary | ICD-10-CM | POA: Diagnosis not present

## 2015-10-30 DIAGNOSIS — N2581 Secondary hyperparathyroidism of renal origin: Secondary | ICD-10-CM | POA: Diagnosis not present

## 2015-10-30 DIAGNOSIS — Z992 Dependence on renal dialysis: Secondary | ICD-10-CM | POA: Diagnosis not present

## 2015-10-30 DIAGNOSIS — T8571XA Infection and inflammatory reaction due to peritoneal dialysis catheter, initial encounter: Secondary | ICD-10-CM | POA: Diagnosis not present

## 2015-10-30 DIAGNOSIS — D509 Iron deficiency anemia, unspecified: Secondary | ICD-10-CM | POA: Diagnosis not present

## 2015-10-30 DIAGNOSIS — B9689 Other specified bacterial agents as the cause of diseases classified elsewhere: Secondary | ICD-10-CM | POA: Diagnosis not present

## 2015-10-31 DIAGNOSIS — T8571XA Infection and inflammatory reaction due to peritoneal dialysis catheter, initial encounter: Secondary | ICD-10-CM | POA: Diagnosis not present

## 2015-10-31 DIAGNOSIS — B9689 Other specified bacterial agents as the cause of diseases classified elsewhere: Secondary | ICD-10-CM | POA: Diagnosis not present

## 2015-10-31 DIAGNOSIS — N2581 Secondary hyperparathyroidism of renal origin: Secondary | ICD-10-CM | POA: Diagnosis not present

## 2015-10-31 DIAGNOSIS — D509 Iron deficiency anemia, unspecified: Secondary | ICD-10-CM | POA: Diagnosis not present

## 2015-10-31 DIAGNOSIS — N186 End stage renal disease: Secondary | ICD-10-CM | POA: Diagnosis not present

## 2015-10-31 DIAGNOSIS — Z992 Dependence on renal dialysis: Secondary | ICD-10-CM | POA: Diagnosis not present

## 2015-11-01 DIAGNOSIS — N186 End stage renal disease: Secondary | ICD-10-CM | POA: Diagnosis not present

## 2015-11-01 DIAGNOSIS — Z992 Dependence on renal dialysis: Secondary | ICD-10-CM | POA: Diagnosis not present

## 2015-11-01 DIAGNOSIS — D509 Iron deficiency anemia, unspecified: Secondary | ICD-10-CM | POA: Diagnosis not present

## 2015-11-01 DIAGNOSIS — T8571XA Infection and inflammatory reaction due to peritoneal dialysis catheter, initial encounter: Secondary | ICD-10-CM | POA: Diagnosis not present

## 2015-11-01 DIAGNOSIS — N2581 Secondary hyperparathyroidism of renal origin: Secondary | ICD-10-CM | POA: Diagnosis not present

## 2015-11-01 DIAGNOSIS — B9689 Other specified bacterial agents as the cause of diseases classified elsewhere: Secondary | ICD-10-CM | POA: Diagnosis not present

## 2015-11-03 DIAGNOSIS — Z992 Dependence on renal dialysis: Secondary | ICD-10-CM | POA: Diagnosis not present

## 2015-11-03 DIAGNOSIS — B9689 Other specified bacterial agents as the cause of diseases classified elsewhere: Secondary | ICD-10-CM | POA: Diagnosis not present

## 2015-11-03 DIAGNOSIS — T8571XA Infection and inflammatory reaction due to peritoneal dialysis catheter, initial encounter: Secondary | ICD-10-CM | POA: Diagnosis not present

## 2015-11-03 DIAGNOSIS — N186 End stage renal disease: Secondary | ICD-10-CM | POA: Diagnosis not present

## 2015-11-03 DIAGNOSIS — N2581 Secondary hyperparathyroidism of renal origin: Secondary | ICD-10-CM | POA: Diagnosis not present

## 2015-11-03 DIAGNOSIS — D509 Iron deficiency anemia, unspecified: Secondary | ICD-10-CM | POA: Diagnosis not present

## 2015-11-04 DIAGNOSIS — B9689 Other specified bacterial agents as the cause of diseases classified elsewhere: Secondary | ICD-10-CM | POA: Diagnosis not present

## 2015-11-04 DIAGNOSIS — N2581 Secondary hyperparathyroidism of renal origin: Secondary | ICD-10-CM | POA: Diagnosis not present

## 2015-11-04 DIAGNOSIS — D509 Iron deficiency anemia, unspecified: Secondary | ICD-10-CM | POA: Diagnosis not present

## 2015-11-04 DIAGNOSIS — T8571XA Infection and inflammatory reaction due to peritoneal dialysis catheter, initial encounter: Secondary | ICD-10-CM | POA: Diagnosis not present

## 2015-11-04 DIAGNOSIS — Z992 Dependence on renal dialysis: Secondary | ICD-10-CM | POA: Diagnosis not present

## 2015-11-04 DIAGNOSIS — N186 End stage renal disease: Secondary | ICD-10-CM | POA: Diagnosis not present

## 2015-11-05 DIAGNOSIS — T8571XA Infection and inflammatory reaction due to peritoneal dialysis catheter, initial encounter: Secondary | ICD-10-CM | POA: Diagnosis not present

## 2015-11-05 DIAGNOSIS — B9689 Other specified bacterial agents as the cause of diseases classified elsewhere: Secondary | ICD-10-CM | POA: Diagnosis not present

## 2015-11-05 DIAGNOSIS — N2581 Secondary hyperparathyroidism of renal origin: Secondary | ICD-10-CM | POA: Diagnosis not present

## 2015-11-05 DIAGNOSIS — Z992 Dependence on renal dialysis: Secondary | ICD-10-CM | POA: Diagnosis not present

## 2015-11-05 DIAGNOSIS — D509 Iron deficiency anemia, unspecified: Secondary | ICD-10-CM | POA: Diagnosis not present

## 2015-11-05 DIAGNOSIS — N186 End stage renal disease: Secondary | ICD-10-CM | POA: Diagnosis not present

## 2015-11-06 DIAGNOSIS — N2581 Secondary hyperparathyroidism of renal origin: Secondary | ICD-10-CM | POA: Diagnosis not present

## 2015-11-06 DIAGNOSIS — N186 End stage renal disease: Secondary | ICD-10-CM | POA: Diagnosis not present

## 2015-11-06 DIAGNOSIS — Z992 Dependence on renal dialysis: Secondary | ICD-10-CM | POA: Diagnosis not present

## 2015-11-06 DIAGNOSIS — D509 Iron deficiency anemia, unspecified: Secondary | ICD-10-CM | POA: Diagnosis not present

## 2015-11-06 DIAGNOSIS — T8571XA Infection and inflammatory reaction due to peritoneal dialysis catheter, initial encounter: Secondary | ICD-10-CM | POA: Diagnosis not present

## 2015-11-06 DIAGNOSIS — B9689 Other specified bacterial agents as the cause of diseases classified elsewhere: Secondary | ICD-10-CM | POA: Diagnosis not present

## 2015-11-07 DIAGNOSIS — D509 Iron deficiency anemia, unspecified: Secondary | ICD-10-CM | POA: Diagnosis not present

## 2015-11-07 DIAGNOSIS — B9689 Other specified bacterial agents as the cause of diseases classified elsewhere: Secondary | ICD-10-CM | POA: Diagnosis not present

## 2015-11-07 DIAGNOSIS — N2581 Secondary hyperparathyroidism of renal origin: Secondary | ICD-10-CM | POA: Diagnosis not present

## 2015-11-07 DIAGNOSIS — N186 End stage renal disease: Secondary | ICD-10-CM | POA: Diagnosis not present

## 2015-11-07 DIAGNOSIS — T8571XA Infection and inflammatory reaction due to peritoneal dialysis catheter, initial encounter: Secondary | ICD-10-CM | POA: Diagnosis not present

## 2015-11-07 DIAGNOSIS — Z992 Dependence on renal dialysis: Secondary | ICD-10-CM | POA: Diagnosis not present

## 2015-11-08 DIAGNOSIS — Z992 Dependence on renal dialysis: Secondary | ICD-10-CM | POA: Diagnosis not present

## 2015-11-08 DIAGNOSIS — D509 Iron deficiency anemia, unspecified: Secondary | ICD-10-CM | POA: Diagnosis not present

## 2015-11-08 DIAGNOSIS — T8571XA Infection and inflammatory reaction due to peritoneal dialysis catheter, initial encounter: Secondary | ICD-10-CM | POA: Diagnosis not present

## 2015-11-08 DIAGNOSIS — N186 End stage renal disease: Secondary | ICD-10-CM | POA: Diagnosis not present

## 2015-11-08 DIAGNOSIS — B9689 Other specified bacterial agents as the cause of diseases classified elsewhere: Secondary | ICD-10-CM | POA: Diagnosis not present

## 2015-11-08 DIAGNOSIS — N2581 Secondary hyperparathyroidism of renal origin: Secondary | ICD-10-CM | POA: Diagnosis not present

## 2015-11-10 DIAGNOSIS — N2581 Secondary hyperparathyroidism of renal origin: Secondary | ICD-10-CM | POA: Diagnosis not present

## 2015-11-10 DIAGNOSIS — T8571XA Infection and inflammatory reaction due to peritoneal dialysis catheter, initial encounter: Secondary | ICD-10-CM | POA: Diagnosis not present

## 2015-11-10 DIAGNOSIS — B9689 Other specified bacterial agents as the cause of diseases classified elsewhere: Secondary | ICD-10-CM | POA: Diagnosis not present

## 2015-11-10 DIAGNOSIS — Z992 Dependence on renal dialysis: Secondary | ICD-10-CM | POA: Diagnosis not present

## 2015-11-10 DIAGNOSIS — D509 Iron deficiency anemia, unspecified: Secondary | ICD-10-CM | POA: Diagnosis not present

## 2015-11-10 DIAGNOSIS — N186 End stage renal disease: Secondary | ICD-10-CM | POA: Diagnosis not present

## 2015-11-11 DIAGNOSIS — D509 Iron deficiency anemia, unspecified: Secondary | ICD-10-CM | POA: Diagnosis not present

## 2015-11-11 DIAGNOSIS — B9689 Other specified bacterial agents as the cause of diseases classified elsewhere: Secondary | ICD-10-CM | POA: Diagnosis not present

## 2015-11-11 DIAGNOSIS — Z992 Dependence on renal dialysis: Secondary | ICD-10-CM | POA: Diagnosis not present

## 2015-11-11 DIAGNOSIS — T8571XA Infection and inflammatory reaction due to peritoneal dialysis catheter, initial encounter: Secondary | ICD-10-CM | POA: Diagnosis not present

## 2015-11-11 DIAGNOSIS — N186 End stage renal disease: Secondary | ICD-10-CM | POA: Diagnosis not present

## 2015-11-11 DIAGNOSIS — N2581 Secondary hyperparathyroidism of renal origin: Secondary | ICD-10-CM | POA: Diagnosis not present

## 2015-11-12 DIAGNOSIS — N186 End stage renal disease: Secondary | ICD-10-CM | POA: Diagnosis not present

## 2015-11-12 DIAGNOSIS — D509 Iron deficiency anemia, unspecified: Secondary | ICD-10-CM | POA: Diagnosis not present

## 2015-11-12 DIAGNOSIS — Z992 Dependence on renal dialysis: Secondary | ICD-10-CM | POA: Diagnosis not present

## 2015-11-12 DIAGNOSIS — N2581 Secondary hyperparathyroidism of renal origin: Secondary | ICD-10-CM | POA: Diagnosis not present

## 2015-11-12 DIAGNOSIS — B9689 Other specified bacterial agents as the cause of diseases classified elsewhere: Secondary | ICD-10-CM | POA: Diagnosis not present

## 2015-11-12 DIAGNOSIS — T8571XA Infection and inflammatory reaction due to peritoneal dialysis catheter, initial encounter: Secondary | ICD-10-CM | POA: Diagnosis not present

## 2015-11-13 DIAGNOSIS — N186 End stage renal disease: Secondary | ICD-10-CM | POA: Diagnosis not present

## 2015-11-13 DIAGNOSIS — T8571XA Infection and inflammatory reaction due to peritoneal dialysis catheter, initial encounter: Secondary | ICD-10-CM | POA: Diagnosis not present

## 2015-11-13 DIAGNOSIS — B9689 Other specified bacterial agents as the cause of diseases classified elsewhere: Secondary | ICD-10-CM | POA: Diagnosis not present

## 2015-11-13 DIAGNOSIS — D509 Iron deficiency anemia, unspecified: Secondary | ICD-10-CM | POA: Diagnosis not present

## 2015-11-13 DIAGNOSIS — Z992 Dependence on renal dialysis: Secondary | ICD-10-CM | POA: Diagnosis not present

## 2015-11-13 DIAGNOSIS — N2581 Secondary hyperparathyroidism of renal origin: Secondary | ICD-10-CM | POA: Diagnosis not present

## 2015-11-14 DIAGNOSIS — N2581 Secondary hyperparathyroidism of renal origin: Secondary | ICD-10-CM | POA: Diagnosis not present

## 2015-11-14 DIAGNOSIS — N186 End stage renal disease: Secondary | ICD-10-CM | POA: Diagnosis not present

## 2015-11-14 DIAGNOSIS — B9689 Other specified bacterial agents as the cause of diseases classified elsewhere: Secondary | ICD-10-CM | POA: Diagnosis not present

## 2015-11-14 DIAGNOSIS — Z992 Dependence on renal dialysis: Secondary | ICD-10-CM | POA: Diagnosis not present

## 2015-11-14 DIAGNOSIS — D509 Iron deficiency anemia, unspecified: Secondary | ICD-10-CM | POA: Diagnosis not present

## 2015-11-14 DIAGNOSIS — T8571XA Infection and inflammatory reaction due to peritoneal dialysis catheter, initial encounter: Secondary | ICD-10-CM | POA: Diagnosis not present

## 2015-11-15 DIAGNOSIS — N2581 Secondary hyperparathyroidism of renal origin: Secondary | ICD-10-CM | POA: Diagnosis not present

## 2015-11-15 DIAGNOSIS — B9689 Other specified bacterial agents as the cause of diseases classified elsewhere: Secondary | ICD-10-CM | POA: Diagnosis not present

## 2015-11-15 DIAGNOSIS — D509 Iron deficiency anemia, unspecified: Secondary | ICD-10-CM | POA: Diagnosis not present

## 2015-11-15 DIAGNOSIS — N186 End stage renal disease: Secondary | ICD-10-CM | POA: Diagnosis not present

## 2015-11-15 DIAGNOSIS — Z992 Dependence on renal dialysis: Secondary | ICD-10-CM | POA: Diagnosis not present

## 2015-11-15 DIAGNOSIS — T8571XA Infection and inflammatory reaction due to peritoneal dialysis catheter, initial encounter: Secondary | ICD-10-CM | POA: Diagnosis not present

## 2015-11-17 DIAGNOSIS — Z992 Dependence on renal dialysis: Secondary | ICD-10-CM | POA: Diagnosis not present

## 2015-11-17 DIAGNOSIS — D509 Iron deficiency anemia, unspecified: Secondary | ICD-10-CM | POA: Diagnosis not present

## 2015-11-17 DIAGNOSIS — B9689 Other specified bacterial agents as the cause of diseases classified elsewhere: Secondary | ICD-10-CM | POA: Diagnosis not present

## 2015-11-17 DIAGNOSIS — N2581 Secondary hyperparathyroidism of renal origin: Secondary | ICD-10-CM | POA: Diagnosis not present

## 2015-11-17 DIAGNOSIS — N186 End stage renal disease: Secondary | ICD-10-CM | POA: Diagnosis not present

## 2015-11-17 DIAGNOSIS — T8571XA Infection and inflammatory reaction due to peritoneal dialysis catheter, initial encounter: Secondary | ICD-10-CM | POA: Diagnosis not present

## 2015-11-18 DIAGNOSIS — N2581 Secondary hyperparathyroidism of renal origin: Secondary | ICD-10-CM | POA: Diagnosis not present

## 2015-11-18 DIAGNOSIS — T8571XA Infection and inflammatory reaction due to peritoneal dialysis catheter, initial encounter: Secondary | ICD-10-CM | POA: Diagnosis not present

## 2015-11-18 DIAGNOSIS — B9689 Other specified bacterial agents as the cause of diseases classified elsewhere: Secondary | ICD-10-CM | POA: Diagnosis not present

## 2015-11-18 DIAGNOSIS — Z992 Dependence on renal dialysis: Secondary | ICD-10-CM | POA: Diagnosis not present

## 2015-11-18 DIAGNOSIS — D509 Iron deficiency anemia, unspecified: Secondary | ICD-10-CM | POA: Diagnosis not present

## 2015-11-18 DIAGNOSIS — N186 End stage renal disease: Secondary | ICD-10-CM | POA: Diagnosis not present

## 2015-11-19 DIAGNOSIS — Z992 Dependence on renal dialysis: Secondary | ICD-10-CM | POA: Diagnosis not present

## 2015-11-19 DIAGNOSIS — D692 Other nonthrombocytopenic purpura: Secondary | ICD-10-CM | POA: Diagnosis not present

## 2015-11-19 DIAGNOSIS — N186 End stage renal disease: Secondary | ICD-10-CM | POA: Diagnosis not present

## 2015-11-19 DIAGNOSIS — L853 Xerosis cutis: Secondary | ICD-10-CM | POA: Diagnosis not present

## 2015-11-19 DIAGNOSIS — Z85828 Personal history of other malignant neoplasm of skin: Secondary | ICD-10-CM | POA: Diagnosis not present

## 2015-11-19 DIAGNOSIS — B009 Herpesviral infection, unspecified: Secondary | ICD-10-CM | POA: Diagnosis not present

## 2015-11-19 DIAGNOSIS — L820 Inflamed seborrheic keratosis: Secondary | ICD-10-CM | POA: Diagnosis not present

## 2015-11-19 DIAGNOSIS — D1800 Hemangioma unspecified site: Secondary | ICD-10-CM | POA: Diagnosis not present

## 2015-11-19 DIAGNOSIS — D509 Iron deficiency anemia, unspecified: Secondary | ICD-10-CM | POA: Diagnosis not present

## 2015-11-19 DIAGNOSIS — N2581 Secondary hyperparathyroidism of renal origin: Secondary | ICD-10-CM | POA: Diagnosis not present

## 2015-11-19 DIAGNOSIS — T8571XA Infection and inflammatory reaction due to peritoneal dialysis catheter, initial encounter: Secondary | ICD-10-CM | POA: Diagnosis not present

## 2015-11-19 DIAGNOSIS — B9689 Other specified bacterial agents as the cause of diseases classified elsewhere: Secondary | ICD-10-CM | POA: Diagnosis not present

## 2015-11-19 DIAGNOSIS — L720 Epidermal cyst: Secondary | ICD-10-CM | POA: Diagnosis not present

## 2015-11-19 DIAGNOSIS — L821 Other seborrheic keratosis: Secondary | ICD-10-CM | POA: Diagnosis not present

## 2015-11-20 DIAGNOSIS — N2581 Secondary hyperparathyroidism of renal origin: Secondary | ICD-10-CM | POA: Diagnosis not present

## 2015-11-20 DIAGNOSIS — D509 Iron deficiency anemia, unspecified: Secondary | ICD-10-CM | POA: Diagnosis not present

## 2015-11-20 DIAGNOSIS — Z992 Dependence on renal dialysis: Secondary | ICD-10-CM | POA: Diagnosis not present

## 2015-11-20 DIAGNOSIS — T8571XA Infection and inflammatory reaction due to peritoneal dialysis catheter, initial encounter: Secondary | ICD-10-CM | POA: Diagnosis not present

## 2015-11-20 DIAGNOSIS — N186 End stage renal disease: Secondary | ICD-10-CM | POA: Diagnosis not present

## 2015-11-20 DIAGNOSIS — B9689 Other specified bacterial agents as the cause of diseases classified elsewhere: Secondary | ICD-10-CM | POA: Diagnosis not present

## 2015-11-21 DIAGNOSIS — D509 Iron deficiency anemia, unspecified: Secondary | ICD-10-CM | POA: Diagnosis not present

## 2015-11-21 DIAGNOSIS — Z992 Dependence on renal dialysis: Secondary | ICD-10-CM | POA: Diagnosis not present

## 2015-11-21 DIAGNOSIS — T8571XA Infection and inflammatory reaction due to peritoneal dialysis catheter, initial encounter: Secondary | ICD-10-CM | POA: Diagnosis not present

## 2015-11-21 DIAGNOSIS — B9689 Other specified bacterial agents as the cause of diseases classified elsewhere: Secondary | ICD-10-CM | POA: Diagnosis not present

## 2015-11-21 DIAGNOSIS — N186 End stage renal disease: Secondary | ICD-10-CM | POA: Diagnosis not present

## 2015-11-21 DIAGNOSIS — N2581 Secondary hyperparathyroidism of renal origin: Secondary | ICD-10-CM | POA: Diagnosis not present

## 2015-11-22 DIAGNOSIS — N186 End stage renal disease: Secondary | ICD-10-CM | POA: Diagnosis not present

## 2015-11-22 DIAGNOSIS — Z992 Dependence on renal dialysis: Secondary | ICD-10-CM | POA: Diagnosis not present

## 2015-11-22 DIAGNOSIS — T8571XA Infection and inflammatory reaction due to peritoneal dialysis catheter, initial encounter: Secondary | ICD-10-CM | POA: Diagnosis not present

## 2015-11-22 DIAGNOSIS — N2581 Secondary hyperparathyroidism of renal origin: Secondary | ICD-10-CM | POA: Diagnosis not present

## 2015-11-22 DIAGNOSIS — D509 Iron deficiency anemia, unspecified: Secondary | ICD-10-CM | POA: Diagnosis not present

## 2015-11-22 DIAGNOSIS — B9689 Other specified bacterial agents as the cause of diseases classified elsewhere: Secondary | ICD-10-CM | POA: Diagnosis not present

## 2015-11-24 DIAGNOSIS — T8571XA Infection and inflammatory reaction due to peritoneal dialysis catheter, initial encounter: Secondary | ICD-10-CM | POA: Diagnosis not present

## 2015-11-24 DIAGNOSIS — Z992 Dependence on renal dialysis: Secondary | ICD-10-CM | POA: Diagnosis not present

## 2015-11-24 DIAGNOSIS — N2581 Secondary hyperparathyroidism of renal origin: Secondary | ICD-10-CM | POA: Diagnosis not present

## 2015-11-24 DIAGNOSIS — D509 Iron deficiency anemia, unspecified: Secondary | ICD-10-CM | POA: Diagnosis not present

## 2015-11-24 DIAGNOSIS — B9689 Other specified bacterial agents as the cause of diseases classified elsewhere: Secondary | ICD-10-CM | POA: Diagnosis not present

## 2015-11-24 DIAGNOSIS — N186 End stage renal disease: Secondary | ICD-10-CM | POA: Diagnosis not present

## 2015-11-25 DIAGNOSIS — B9689 Other specified bacterial agents as the cause of diseases classified elsewhere: Secondary | ICD-10-CM | POA: Diagnosis not present

## 2015-11-25 DIAGNOSIS — D509 Iron deficiency anemia, unspecified: Secondary | ICD-10-CM | POA: Diagnosis not present

## 2015-11-25 DIAGNOSIS — T8571XA Infection and inflammatory reaction due to peritoneal dialysis catheter, initial encounter: Secondary | ICD-10-CM | POA: Diagnosis not present

## 2015-11-25 DIAGNOSIS — N186 End stage renal disease: Secondary | ICD-10-CM | POA: Diagnosis not present

## 2015-11-25 DIAGNOSIS — Z992 Dependence on renal dialysis: Secondary | ICD-10-CM | POA: Diagnosis not present

## 2015-11-25 DIAGNOSIS — N2581 Secondary hyperparathyroidism of renal origin: Secondary | ICD-10-CM | POA: Diagnosis not present

## 2015-11-26 DIAGNOSIS — B9689 Other specified bacterial agents as the cause of diseases classified elsewhere: Secondary | ICD-10-CM | POA: Diagnosis not present

## 2015-11-26 DIAGNOSIS — Z992 Dependence on renal dialysis: Secondary | ICD-10-CM | POA: Diagnosis not present

## 2015-11-26 DIAGNOSIS — N186 End stage renal disease: Secondary | ICD-10-CM | POA: Diagnosis not present

## 2015-11-26 DIAGNOSIS — D509 Iron deficiency anemia, unspecified: Secondary | ICD-10-CM | POA: Diagnosis not present

## 2015-11-26 DIAGNOSIS — N2581 Secondary hyperparathyroidism of renal origin: Secondary | ICD-10-CM | POA: Diagnosis not present

## 2015-11-26 DIAGNOSIS — T8571XA Infection and inflammatory reaction due to peritoneal dialysis catheter, initial encounter: Secondary | ICD-10-CM | POA: Diagnosis not present

## 2015-11-27 ENCOUNTER — Ambulatory Visit: Payer: Medicare Other

## 2015-11-27 DIAGNOSIS — D509 Iron deficiency anemia, unspecified: Secondary | ICD-10-CM | POA: Diagnosis not present

## 2015-11-27 DIAGNOSIS — N186 End stage renal disease: Secondary | ICD-10-CM | POA: Diagnosis not present

## 2015-11-27 DIAGNOSIS — Z992 Dependence on renal dialysis: Secondary | ICD-10-CM | POA: Diagnosis not present

## 2015-11-28 DIAGNOSIS — N186 End stage renal disease: Secondary | ICD-10-CM | POA: Diagnosis not present

## 2015-11-28 DIAGNOSIS — Z992 Dependence on renal dialysis: Secondary | ICD-10-CM | POA: Diagnosis not present

## 2015-11-28 DIAGNOSIS — D509 Iron deficiency anemia, unspecified: Secondary | ICD-10-CM | POA: Diagnosis not present

## 2015-11-29 DIAGNOSIS — D509 Iron deficiency anemia, unspecified: Secondary | ICD-10-CM | POA: Diagnosis not present

## 2015-11-29 DIAGNOSIS — Z992 Dependence on renal dialysis: Secondary | ICD-10-CM | POA: Diagnosis not present

## 2015-11-29 DIAGNOSIS — N186 End stage renal disease: Secondary | ICD-10-CM | POA: Diagnosis not present

## 2015-12-01 DIAGNOSIS — Z992 Dependence on renal dialysis: Secondary | ICD-10-CM | POA: Diagnosis not present

## 2015-12-01 DIAGNOSIS — D509 Iron deficiency anemia, unspecified: Secondary | ICD-10-CM | POA: Diagnosis not present

## 2015-12-01 DIAGNOSIS — N186 End stage renal disease: Secondary | ICD-10-CM | POA: Diagnosis not present

## 2015-12-02 DIAGNOSIS — N186 End stage renal disease: Secondary | ICD-10-CM | POA: Diagnosis not present

## 2015-12-02 DIAGNOSIS — Z992 Dependence on renal dialysis: Secondary | ICD-10-CM | POA: Diagnosis not present

## 2015-12-02 DIAGNOSIS — D509 Iron deficiency anemia, unspecified: Secondary | ICD-10-CM | POA: Diagnosis not present

## 2015-12-03 DIAGNOSIS — D509 Iron deficiency anemia, unspecified: Secondary | ICD-10-CM | POA: Diagnosis not present

## 2015-12-03 DIAGNOSIS — Z992 Dependence on renal dialysis: Secondary | ICD-10-CM | POA: Diagnosis not present

## 2015-12-03 DIAGNOSIS — N186 End stage renal disease: Secondary | ICD-10-CM | POA: Diagnosis not present

## 2015-12-04 DIAGNOSIS — D509 Iron deficiency anemia, unspecified: Secondary | ICD-10-CM | POA: Diagnosis not present

## 2015-12-04 DIAGNOSIS — Z992 Dependence on renal dialysis: Secondary | ICD-10-CM | POA: Diagnosis not present

## 2015-12-04 DIAGNOSIS — N186 End stage renal disease: Secondary | ICD-10-CM | POA: Diagnosis not present

## 2015-12-05 DIAGNOSIS — N186 End stage renal disease: Secondary | ICD-10-CM | POA: Diagnosis not present

## 2015-12-05 DIAGNOSIS — I495 Sick sinus syndrome: Secondary | ICD-10-CM | POA: Diagnosis not present

## 2015-12-05 DIAGNOSIS — Z992 Dependence on renal dialysis: Secondary | ICD-10-CM | POA: Diagnosis not present

## 2015-12-05 DIAGNOSIS — D509 Iron deficiency anemia, unspecified: Secondary | ICD-10-CM | POA: Diagnosis not present

## 2015-12-06 DIAGNOSIS — N186 End stage renal disease: Secondary | ICD-10-CM | POA: Diagnosis not present

## 2015-12-06 DIAGNOSIS — Z992 Dependence on renal dialysis: Secondary | ICD-10-CM | POA: Diagnosis not present

## 2015-12-06 DIAGNOSIS — D509 Iron deficiency anemia, unspecified: Secondary | ICD-10-CM | POA: Diagnosis not present

## 2015-12-08 DIAGNOSIS — D509 Iron deficiency anemia, unspecified: Secondary | ICD-10-CM | POA: Diagnosis not present

## 2015-12-08 DIAGNOSIS — Z992 Dependence on renal dialysis: Secondary | ICD-10-CM | POA: Diagnosis not present

## 2015-12-08 DIAGNOSIS — N186 End stage renal disease: Secondary | ICD-10-CM | POA: Diagnosis not present

## 2015-12-09 DIAGNOSIS — D509 Iron deficiency anemia, unspecified: Secondary | ICD-10-CM | POA: Diagnosis not present

## 2015-12-09 DIAGNOSIS — L720 Epidermal cyst: Secondary | ICD-10-CM | POA: Diagnosis not present

## 2015-12-09 DIAGNOSIS — L0291 Cutaneous abscess, unspecified: Secondary | ICD-10-CM | POA: Diagnosis not present

## 2015-12-09 DIAGNOSIS — N186 End stage renal disease: Secondary | ICD-10-CM | POA: Diagnosis not present

## 2015-12-09 DIAGNOSIS — R203 Hyperesthesia: Secondary | ICD-10-CM | POA: Diagnosis not present

## 2015-12-09 DIAGNOSIS — Z992 Dependence on renal dialysis: Secondary | ICD-10-CM | POA: Diagnosis not present

## 2015-12-10 DIAGNOSIS — D509 Iron deficiency anemia, unspecified: Secondary | ICD-10-CM | POA: Diagnosis not present

## 2015-12-10 DIAGNOSIS — Z992 Dependence on renal dialysis: Secondary | ICD-10-CM | POA: Diagnosis not present

## 2015-12-10 DIAGNOSIS — N186 End stage renal disease: Secondary | ICD-10-CM | POA: Diagnosis not present

## 2015-12-11 DIAGNOSIS — N186 End stage renal disease: Secondary | ICD-10-CM | POA: Diagnosis not present

## 2015-12-11 DIAGNOSIS — Z992 Dependence on renal dialysis: Secondary | ICD-10-CM | POA: Diagnosis not present

## 2015-12-11 DIAGNOSIS — D509 Iron deficiency anemia, unspecified: Secondary | ICD-10-CM | POA: Diagnosis not present

## 2015-12-12 DIAGNOSIS — D509 Iron deficiency anemia, unspecified: Secondary | ICD-10-CM | POA: Diagnosis not present

## 2015-12-12 DIAGNOSIS — Z992 Dependence on renal dialysis: Secondary | ICD-10-CM | POA: Diagnosis not present

## 2015-12-12 DIAGNOSIS — N186 End stage renal disease: Secondary | ICD-10-CM | POA: Diagnosis not present

## 2015-12-13 DIAGNOSIS — Z992 Dependence on renal dialysis: Secondary | ICD-10-CM | POA: Diagnosis not present

## 2015-12-13 DIAGNOSIS — N186 End stage renal disease: Secondary | ICD-10-CM | POA: Diagnosis not present

## 2015-12-13 DIAGNOSIS — D509 Iron deficiency anemia, unspecified: Secondary | ICD-10-CM | POA: Diagnosis not present

## 2015-12-14 ENCOUNTER — Inpatient Hospital Stay
Admission: EM | Admit: 2015-12-14 | Discharge: 2015-12-18 | DRG: 480 | Disposition: A | Discharge status: Skilled Nursing Facility | Payer: Medicare Other | Attending: Internal Medicine | Admitting: Internal Medicine

## 2015-12-14 ENCOUNTER — Encounter: Payer: Self-pay | Admitting: Internal Medicine

## 2015-12-14 ENCOUNTER — Emergency Department: Payer: Medicare Other

## 2015-12-14 DIAGNOSIS — S7222XA Displaced subtrochanteric fracture of left femur, initial encounter for closed fracture: Secondary | ICD-10-CM | POA: Diagnosis present

## 2015-12-14 DIAGNOSIS — I429 Cardiomyopathy, unspecified: Secondary | ICD-10-CM | POA: Diagnosis present

## 2015-12-14 DIAGNOSIS — S299XXA Unspecified injury of thorax, initial encounter: Secondary | ICD-10-CM | POA: Diagnosis not present

## 2015-12-14 DIAGNOSIS — H8109 Meniere's disease, unspecified ear: Secondary | ICD-10-CM | POA: Insufficient documentation

## 2015-12-14 DIAGNOSIS — Z950 Presence of cardiac pacemaker: Secondary | ICD-10-CM

## 2015-12-14 DIAGNOSIS — D631 Anemia in chronic kidney disease: Secondary | ICD-10-CM | POA: Diagnosis present

## 2015-12-14 DIAGNOSIS — I2510 Atherosclerotic heart disease of native coronary artery without angina pectoris: Secondary | ICD-10-CM | POA: Diagnosis present

## 2015-12-14 DIAGNOSIS — I252 Old myocardial infarction: Secondary | ICD-10-CM

## 2015-12-14 DIAGNOSIS — Z87891 Personal history of nicotine dependence: Secondary | ICD-10-CM

## 2015-12-14 DIAGNOSIS — I5020 Unspecified systolic (congestive) heart failure: Secondary | ICD-10-CM | POA: Diagnosis not present

## 2015-12-14 DIAGNOSIS — I132 Hypertensive heart and chronic kidney disease with heart failure and with stage 5 chronic kidney disease, or end stage renal disease: Secondary | ICD-10-CM | POA: Diagnosis present

## 2015-12-14 DIAGNOSIS — Z992 Dependence on renal dialysis: Secondary | ICD-10-CM | POA: Diagnosis not present

## 2015-12-14 DIAGNOSIS — Z79891 Long term (current) use of opiate analgesic: Secondary | ICD-10-CM

## 2015-12-14 DIAGNOSIS — R262 Difficulty in walking, not elsewhere classified: Secondary | ICD-10-CM | POA: Diagnosis not present

## 2015-12-14 DIAGNOSIS — Z5189 Encounter for other specified aftercare: Secondary | ICD-10-CM | POA: Diagnosis not present

## 2015-12-14 DIAGNOSIS — N186 End stage renal disease: Secondary | ICD-10-CM | POA: Diagnosis not present

## 2015-12-14 DIAGNOSIS — S7292XA Unspecified fracture of left femur, initial encounter for closed fracture: Secondary | ICD-10-CM | POA: Diagnosis present

## 2015-12-14 DIAGNOSIS — W19XXXA Unspecified fall, initial encounter: Secondary | ICD-10-CM | POA: Diagnosis not present

## 2015-12-14 DIAGNOSIS — I5022 Chronic systolic (congestive) heart failure: Secondary | ICD-10-CM | POA: Diagnosis present

## 2015-12-14 DIAGNOSIS — S79912A Unspecified injury of left hip, initial encounter: Secondary | ICD-10-CM | POA: Diagnosis not present

## 2015-12-14 DIAGNOSIS — R1312 Dysphagia, oropharyngeal phase: Secondary | ICD-10-CM | POA: Diagnosis not present

## 2015-12-14 DIAGNOSIS — G4733 Obstructive sleep apnea (adult) (pediatric): Secondary | ICD-10-CM | POA: Diagnosis present

## 2015-12-14 DIAGNOSIS — Z7401 Bed confinement status: Secondary | ICD-10-CM | POA: Diagnosis not present

## 2015-12-14 DIAGNOSIS — N2581 Secondary hyperparathyroidism of renal origin: Secondary | ICD-10-CM | POA: Diagnosis present

## 2015-12-14 DIAGNOSIS — R296 Repeated falls: Secondary | ICD-10-CM | POA: Diagnosis present

## 2015-12-14 DIAGNOSIS — Z885 Allergy status to narcotic agent status: Secondary | ICD-10-CM

## 2015-12-14 DIAGNOSIS — S7292XD Unspecified fracture of left femur, subsequent encounter for closed fracture with routine healing: Secondary | ICD-10-CM | POA: Diagnosis not present

## 2015-12-14 DIAGNOSIS — E039 Hypothyroidism, unspecified: Secondary | ICD-10-CM | POA: Diagnosis present

## 2015-12-14 DIAGNOSIS — D62 Acute posthemorrhagic anemia: Secondary | ICD-10-CM | POA: Diagnosis not present

## 2015-12-14 DIAGNOSIS — I110 Hypertensive heart disease with heart failure: Secondary | ICD-10-CM | POA: Diagnosis not present

## 2015-12-14 DIAGNOSIS — Z79899 Other long term (current) drug therapy: Secondary | ICD-10-CM | POA: Diagnosis not present

## 2015-12-14 DIAGNOSIS — I10 Essential (primary) hypertension: Secondary | ICD-10-CM | POA: Diagnosis not present

## 2015-12-14 DIAGNOSIS — W010XXA Fall on same level from slipping, tripping and stumbling without subsequent striking against object, initial encounter: Secondary | ICD-10-CM | POA: Diagnosis present

## 2015-12-14 DIAGNOSIS — S72492A Other fracture of lower end of left femur, initial encounter for closed fracture: Secondary | ICD-10-CM | POA: Diagnosis not present

## 2015-12-14 DIAGNOSIS — I509 Heart failure, unspecified: Secondary | ICD-10-CM | POA: Diagnosis not present

## 2015-12-14 DIAGNOSIS — E876 Hypokalemia: Secondary | ICD-10-CM | POA: Diagnosis present

## 2015-12-14 DIAGNOSIS — F329 Major depressive disorder, single episode, unspecified: Secondary | ICD-10-CM | POA: Diagnosis present

## 2015-12-14 DIAGNOSIS — Z888 Allergy status to other drugs, medicaments and biological substances status: Secondary | ICD-10-CM | POA: Diagnosis not present

## 2015-12-14 DIAGNOSIS — L8990 Pressure ulcer of unspecified site, unspecified stage: Secondary | ICD-10-CM | POA: Diagnosis present

## 2015-12-14 DIAGNOSIS — M25559 Pain in unspecified hip: Secondary | ICD-10-CM | POA: Diagnosis not present

## 2015-12-14 DIAGNOSIS — M9712XA Periprosthetic fracture around internal prosthetic left knee joint, initial encounter: Secondary | ICD-10-CM | POA: Diagnosis not present

## 2015-12-14 DIAGNOSIS — E038 Other specified hypothyroidism: Secondary | ICD-10-CM | POA: Diagnosis not present

## 2015-12-14 DIAGNOSIS — R41841 Cognitive communication deficit: Secondary | ICD-10-CM | POA: Diagnosis not present

## 2015-12-14 DIAGNOSIS — Z9989 Dependence on other enabling machines and devices: Secondary | ICD-10-CM | POA: Diagnosis not present

## 2015-12-14 DIAGNOSIS — M25562 Pain in left knee: Secondary | ICD-10-CM | POA: Diagnosis not present

## 2015-12-14 DIAGNOSIS — M6281 Muscle weakness (generalized): Secondary | ICD-10-CM | POA: Diagnosis not present

## 2015-12-14 HISTORY — DX: Cardiomyopathy, unspecified: I42.9

## 2015-12-14 HISTORY — DX: Irritable bowel syndrome without diarrhea: K58.9

## 2015-12-14 HISTORY — DX: Obstructive sleep apnea (adult) (pediatric): G47.33

## 2015-12-14 HISTORY — DX: Atherosclerotic heart disease of native coronary artery without angina pectoris: I25.10

## 2015-12-14 HISTORY — DX: Acute myocardial infarction, unspecified: I21.9

## 2015-12-14 HISTORY — DX: Presence of cardiac pacemaker: Z95.0

## 2015-12-14 LAB — URINALYSIS COMPLETE WITH MICROSCOPIC (ARMC ONLY)
BILIRUBIN URINE: NEGATIVE
Bacteria, UA: NONE SEEN
Glucose, UA: NEGATIVE mg/dL
HGB URINE DIPSTICK: NEGATIVE
KETONES UR: NEGATIVE mg/dL
LEUKOCYTES UA: NEGATIVE
Nitrite: NEGATIVE
PH: 6 (ref 5.0–8.0)
Protein, ur: 100 mg/dL — AB
Specific Gravity, Urine: 1.012 (ref 1.005–1.030)

## 2015-12-14 LAB — CBC
HCT: 33.8 % — ABNORMAL LOW (ref 35.0–47.0)
Hemoglobin: 11.6 g/dL — ABNORMAL LOW (ref 12.0–16.0)
MCH: 31 pg (ref 26.0–34.0)
MCHC: 34.4 g/dL (ref 32.0–36.0)
MCV: 90.1 fL (ref 80.0–100.0)
PLATELETS: 216 10*3/uL (ref 150–440)
RBC: 3.75 MIL/uL — ABNORMAL LOW (ref 3.80–5.20)
RDW: 13.8 % (ref 11.5–14.5)
WBC: 8.5 10*3/uL (ref 3.6–11.0)

## 2015-12-14 LAB — COMPREHENSIVE METABOLIC PANEL
ALT: 16 U/L (ref 14–54)
AST: 34 U/L (ref 15–41)
Albumin: 3.4 g/dL — ABNORMAL LOW (ref 3.5–5.0)
Alkaline Phosphatase: 57 U/L (ref 38–126)
Anion gap: 6 (ref 5–15)
BUN: 45 mg/dL — AB (ref 6–20)
CHLORIDE: 104 mmol/L (ref 101–111)
CO2: 29 mmol/L (ref 22–32)
CREATININE: 2.13 mg/dL — AB (ref 0.44–1.00)
Calcium: 9.1 mg/dL (ref 8.9–10.3)
GFR calc Af Amer: 23 mL/min — ABNORMAL LOW (ref >60)
GFR calc non Af Amer: 20 mL/min — ABNORMAL LOW (ref >60)
Glucose, Bld: 86 mg/dL (ref 65–99)
Potassium: 3.5 mmol/L (ref 3.5–5.1)
SODIUM: 139 mmol/L (ref 135–145)
Total Bilirubin: 0.9 mg/dL (ref 0.3–1.2)
Total Protein: 6.4 g/dL — ABNORMAL LOW (ref 6.5–8.1)

## 2015-12-14 LAB — PROTIME-INR
INR: 1.03
Prothrombin Time: 13.5 seconds (ref 11.4–15.2)

## 2015-12-14 LAB — TYPE AND SCREEN
ABO/RH(D): B POS
Antibody Screen: NEGATIVE

## 2015-12-14 LAB — APTT: aPTT: 34 seconds (ref 24–36)

## 2015-12-14 LAB — MRSA PCR SCREENING: MRSA by PCR: NEGATIVE

## 2015-12-14 MED ORDER — METOPROLOL SUCCINATE ER 25 MG PO TB24
25.0000 mg | ORAL_TABLET | Freq: Every day | ORAL | Status: DC
Start: 2015-12-14 — End: 2015-12-19
  Administered 2015-12-14 – 2015-12-17 (×4): 25 mg via ORAL
  Filled 2015-12-14 (×4): qty 1

## 2015-12-14 MED ORDER — BISACODYL 10 MG RE SUPP: 10.0000 mg | Freq: Every day | RECTAL | Status: DC | PRN

## 2015-12-14 MED ORDER — GENTAMICIN SULFATE 0.1 % EX CREA
1.0000 "application " | TOPICAL_CREAM | Freq: Every day | CUTANEOUS | Status: DC
  Administered 2015-12-14 – 2015-12-17 (×3): 1 via TOPICAL
  Filled 2015-12-14 (×2): qty 15

## 2015-12-14 MED ORDER — DELFLEX-LC/1.5% DEXTROSE 344 MOSM/L IP SOLN
INTRAPERITONEAL | Status: DC
  Administered 2015-12-17: 21:00:00 via INTRAPERITONEAL
  Filled 2015-12-14 (×5): qty 3000

## 2015-12-14 MED ORDER — TRAMADOL HCL 50 MG PO TABS
50.0000 mg | ORAL_TABLET | Freq: Four times a day (QID) | ORAL | Status: DC | PRN
  Administered 2015-12-14 – 2015-12-18 (×7): 50 mg via ORAL
  Filled 2015-12-14 (×7): qty 1

## 2015-12-14 MED ORDER — SERTRALINE HCL 50 MG PO TABS
50.0000 mg | ORAL_TABLET | Freq: Every day | ORAL | Status: DC
  Administered 2015-12-14 – 2015-12-16 (×3): 50 mg via ORAL
  Filled 2015-12-14 (×3): qty 1

## 2015-12-14 MED ORDER — ONDANSETRON HCL 4 MG/2ML IJ SOLN: 4.0000 mg | Freq: Four times a day (QID) | INTRAMUSCULAR | Status: DC | PRN

## 2015-12-14 MED ORDER — METHOCARBAMOL 1000 MG/10ML IJ SOLN
1000.0000 mg | Freq: Four times a day (QID) | INTRAMUSCULAR | Status: DC | PRN
  Administered 2015-12-14 – 2015-12-16 (×3): 1000 mg via INTRAVENOUS
  Filled 2015-12-14 (×6): qty 10

## 2015-12-14 MED ORDER — METHOCARBAMOL 1000 MG/10ML IJ SOLN
1000.0000 mg | Freq: Four times a day (QID) | INTRAMUSCULAR | Status: DC
  Filled 2015-12-14 (×3): qty 10

## 2015-12-14 MED ORDER — SODIUM CHLORIDE 0.9 % IV SOLN
INTRAVENOUS | Status: DC
  Administered 2015-12-14 – 2015-12-15 (×2): via INTRAVENOUS

## 2015-12-14 MED ORDER — MECLIZINE HCL 25 MG PO TABS: 25.0000 mg | ORAL_TABLET | Freq: Three times a day (TID) | ORAL | Status: DC | PRN

## 2015-12-14 MED ORDER — SENNOSIDES-DOCUSATE SODIUM 8.6-50 MG PO TABS: 1.0000 | ORAL_TABLET | Freq: Every evening | ORAL | Status: DC | PRN

## 2015-12-14 MED ORDER — ONDANSETRON HCL 4 MG PO TABS: 4.0000 mg | ORAL_TABLET | Freq: Four times a day (QID) | ORAL | Status: DC | PRN

## 2015-12-14 MED ORDER — KETOROLAC TROMETHAMINE 15 MG/ML IJ SOLN
15.0000 mg | Freq: Four times a day (QID) | INTRAMUSCULAR | Status: DC | PRN
  Administered 2015-12-14 – 2015-12-15 (×3): 15 mg via INTRAVENOUS
  Filled 2015-12-14 (×3): qty 1

## 2015-12-14 MED ORDER — ACETAMINOPHEN 650 MG RE SUPP: 650.0000 mg | Freq: Four times a day (QID) | RECTAL | Status: DC | PRN

## 2015-12-14 MED ORDER — ACETAMINOPHEN 325 MG PO TABS: 650.0000 mg | ORAL_TABLET | Freq: Four times a day (QID) | ORAL | Status: DC | PRN

## 2015-12-14 MED ORDER — LEVOTHYROXINE SODIUM 50 MCG PO TABS
25.0000 ug | ORAL_TABLET | Freq: Every day | ORAL | Status: DC
  Administered 2015-12-14 – 2015-12-15 (×2): 25 ug via ORAL
  Filled 2015-12-14 (×2): qty 1

## 2015-12-14 NOTE — Progress Notes (Signed)
Pre pd

## 2015-12-14 NOTE — H&P (Addendum)
[facility] [place] [name]    MR#:  [id number]  DATE OF BIRTH:  07-17-1926  DATE OF ADMISSION:  12/14/2015  PRIMARY CARE PHYSICIAN: [name], MD   REQUESTING/REFERRING PHYSICIAN: Dr Kerman Passey  CHIEF COMPLAINT:   Fall and left knee pain HISTORY OF PRESENT ILLNESS:  Sharon [name]  is a 82 y.o. female with a known history of left knee replacement, chronic Systolic heart failure and end-stage renal disease on peritoneal dialysis who presents to the emergency room after a mechanical fall due to loss of balance and left knee pain. Patient denies loss of consciousness or hitting her head. She has left hip pain and inability to move the left leg due to pain. Xray shows Oblique nondisplaced distal femoral fracture extending to the prosthesis. Orthopedic surgeon was called from the emergency room to evaluate patient. PAST MEDICAL HISTORY:   Past Medical History:  Diagnosis Date  . Kidney failure July 2012  . Kidney failure   . Meniere disease   . Meniere's disease   . Peritoneal dialysis status ([place])   Chronic systolic heart failure followed by Dr. Cammie Sickle  OSA uses [facility] sometimes PAST SURGICAL HISTORY:   Past Surgical History:  Procedure Laterality Date  . [place]  . CATARACT EXTRACTION  2006, 2011  . CHOLECYSTECTOMY  01/2008  . INSERTION OF DIALYSIS CATHETER  07/2010  . PACEMAKER INSERTION  2006  . TOTAL KNEE ARTHROPLASTY  2008   LEFT/Dr Calif    SOCIAL HISTORY:   Social History  Substance Use Topics  . Smoking status: Former Smoker    Quit date: 03/31/1948  . Smokeless tobacco: Never Used  . Alcohol use No    FAMILY HISTORY:   Family History  Problem Relation Age of Onset  . Cancer Mother     ? ovarian - sarcoma  . Cancer Father     Skin cancer  . Diabetes Sister   . COPD Sister   . Depression Sister   . Cancer Sister     Lung - 37 yrs old    DRUG ALLERGIES:    Allergies  Allergen Reactions  . Effexor [Venlafaxine]   . Statins Other (See Comments)    Muscle weakness  . Codeine Other (See Comments)    GI UPSET    REVIEW OF SYSTEMS:   Review of Systems  Constitutional: Negative.  Negative for chills, fever and malaise/fatigue.  HENT: Negative.  Negative for ear discharge, ear pain, hearing loss, nosebleeds and sore throat.   Eyes: Negative.  Negative for blurred vision and pain.  Respiratory: Negative.  Negative for cough, hemoptysis, shortness of breath and wheezing.   Cardiovascular: Negative.  Negative for chest pain, palpitations and leg swelling.  Gastrointestinal: Negative.  Negative for abdominal pain, blood in stool, diarrhea, nausea and vomiting.  Genitourinary: Negative.  Negative for dysuria.  Musculoskeletal: Positive for falls and joint pain. Negative for back pain.  Skin: Negative.   Neurological: Positive for dizziness. Negative for tremors, speech change, focal weakness, seizures and headaches.  Endo/Heme/Allergies: Negative.  Does not bruise/bleed easily.  Psychiatric/Behavioral: Negative.  Negative for depression, hallucinations and suicidal ideas.    MEDICATIONS AT HOME:   Prior to Admission medications   Medication Sig Start Date End Date Taking? Authorizing Provider  B Complex-C-Folic Acid (DIALYVITE TABLET) TABS TAKE 1 BY MOUTH DAILY 03/28/15   [name], MD  diphenoxylate-atropine (LOMOTIL) 2.5-0.025 MG tablet TAKE 1 TABLET BY MOUTH  FOUR TIMES DAILY AS NEEDED FOR DIARRHEA OR LOOSE STOOLS 08/30/15   [name], MD  folic acid-vitamin b complex-vitamin c-selenium-zinc (DIALYVITE) 3 MG TABS tablet Take 1 tablet by mouth daily. 12/08/12   [name], MD  gentamicin ointment (GARAMYCIN) 0.1 %  03/15/12   Historical Provider, MD  levothyroxine (SYNTHROID, LEVOTHROID) 25 MCG tablet Take 1 tablet (25 mcg total) by mouth daily. 10/25/15   [name], MD  meclizine (ANTIVERT) 25 MG tablet Take 1 tablet (25  mg total) by mouth 3 (three) times daily as needed for dizziness. 04/22/15   [name], MD  sertraline (ZOLOFT) 50 MG tablet TAKE 3 TABLETS(150 MG) BY MOUTH DAILY 08/30/15   [name], MD  TOPROL XL 25 MG 24 hr tablet TAKE 1/2 BY MOUTH DAILY 02/21/15   [name], MD  traMADol (ULTRAM) 50 MG tablet TAKE 1/2- 1 TABLET BY MOUTH THREE TIMES DAILY AS NEEDED 10/21/15   [name], MD  Vitamin D, Ergocalciferol, (DRISDOL) 50000 units CAPS capsule  04/17/15   Historical Provider, MD      VITAL SIGNS:  Blood pressure (!) 161/79, pulse 83, temperature 98 F (36.7 C), temperature source Oral, resp. rate 18, height 5\' 4"  (1.626 m), weight 48.5 kg (107 lb), SpO2 98 %.  PHYSICAL EXAMINATION:   Physical Exam  Constitutional: She is oriented to person, place, and time and well-developed, well-nourished, and in no distress. No distress.  HENT:  Head: Normocephalic.  Eyes: No scleral icterus.  Neck: Normal range of motion. Neck supple. No JVD present. No tracheal deviation present.  Cardiovascular: Normal rate and regular rhythm.  Exam reveals no gallop and no friction rub.   Murmur heard. Pulmonary/Chest: Effort normal and breath sounds normal. No respiratory distress. She has no wheezes. She has no rales. She exhibits no tenderness.  Abdominal: Soft. Bowel sounds are normal. She exhibits no distension and no mass. There is no tenderness. There is no rebound and no guarding.  Musculoskeletal: She exhibits no edema.  Left knee swelling with decreased range of motion due to pain  Neurological: She is alert and oriented to person, place, and time.  Skin: Skin is warm. No rash noted. No erythema.  Psychiatric: Affect and judgment normal.      LABORATORY PANEL:   CBC No results for input(s): WBC, HGB, HCT, PLT in the last 168 hours. ------------------------------------------------------------------------------------------------------------------  Chemistries  No results for  input(s): NA, K, CL, CO2, GLUCOSE, BUN, CREATININE, CALCIUM, MG, AST, ALT, ALKPHOS, BILITOT in the last 168 hours.  Invalid input(s): GFRCGP ------------------------------------------------------------------------------------------------------------------  Cardiac Enzymes No results for input(s): TROPONINI in the last 168 hours. ------------------------------------------------------------------------------------------------------------------  RADIOLOGY:  Dg Knee Complete 4 Views Left  Result Date: 12/14/2015 CLINICAL DATA:  [place] on left knee this morning.  Distal femur pain. EXAM: LEFT KNEE - COMPLETE 4+ VIEW COMPARISON:  06/15/2006 FINDINGS: Changes of left knee replacement. There is an oblique nondisplaced fracture through the distal femoral shaft and metaphysis, best seen on the the oblique view. This is also seen on the lateral view extending to the distal femoral prosthesis. There is a small joint effusion. IMPRESSION: Oblique nondisplaced distal femoral fracture extending to the prosthesis. Prior left knee replacement. Small left effusion. Electronically Signed   By: Rolm Baptise M.D.   On: 12/14/2015 11:52   Dg Hip Unilat W Or Wo Pelvis 2-3 Views Left  Result Date: 12/14/2015 CLINICAL DATA:  Left knee pain.  Fall. EXAM: DG HIP (WITH OR WITHOUT  PELVIS) 2-3V LEFT COMPARISON:  None. FINDINGS: Mild symmetric degenerative changes in the hips bilaterally. SI joints are symmetric and unremarkable. No acute bony abnormality. Specifically, no fracture, subluxation, or dislocation. Soft tissues are intact. Peritoneal dialysis catheter projects over the pelvis. IMPRESSION: No acute bony abnormality. Electronically Signed   By: Rolm Baptise M.D.   On: 12/14/2015 11:52    EKG:   Paced rhythm  IMPRESSION AND PLAN:   82 year old female with left knee prosthesis status post mechanical fall and now with distal femoral fracture extending to prosthesis.  1. Distal femoral fracture: Patient may  proceed to operating room without further cardiac workup DVT prophylactic management as per orthopedics. Continue pain control and will need PT and clinical social worker after surgery. Gentle with IVF due to Ef 20%  2. Hypothyroid: Continue Synthroid  3. Chronic systolic heart failure, New York Heart Association class III, EF 20%: Continue beta blocker. Not on ACE inhibitor due to intolerance as per cardiology note  4. End-stage renal disease on peritoneal dialysis: I spoke with nephrology who will see patient today.  5. OSA: CPAP will be ordered if she wants to use it.  6. CAD followed by cardiology: Continue beta blocker. Patient has been intolerant to ACE inhibitor and not on diuretics due to chronic kidney disease. She is not on statin due to significant side effects as per cardiology note. She would benefit from a baby aspirin at discharge.  7. Pacemaker status  All the records are reviewed and case discussed with ED provider. Management plans discussed with the patient and she is in agreement  CODE STATUS: FULL  TOTAL TIME TAKING CARE OF THIS PATIENT: 45 minutes.    Washington Whedbee M.D on 12/14/2015 at 12:55 PM  Between 7am to 6pm - Pager - [phone]  After 6pm go to [email or web address] - password [email or web address]  Office  [phone]  CC: Primary care physician; [name], MD

## 2015-12-14 NOTE — Clinical Social Work Note (Signed)
CSW received consult for distal femur fx. CSW will follow pending PT recs.  Santiago Bumpers, MSW, LCSW-A [phone]

## 2015-12-14 NOTE — Consult Note (Addendum)
Orthopaedic Consultation note  Impression: 1) Left nondisplaced periprosthetic distal femur fracture s/p ground level fall 2) Systolic Heart Failure 3)ESRD  Plan:  1)Hospitalist to risk stratify for possible surgical fixation 2) NWB LLE, knee immobilizer to affected side 3)Hold pharmacologic DVT prophylaxis 4)Preop labs 5)NPO now.   HPI: This is a pleasant 82 year old female with history of recent ground level fall this AM. She states that she often has balance issues and falls frequently. States that she did not have chest pain or shortness of breath, she simply "lost her balance." She walks with walker. She is having isolated left knee pain at this time.   Past Medical History:  Diagnosis Date  . CAD (coronary artery disease)   . Cardiomyopathy ([place])   . Kidney failure July 2012  . Kidney failure   . Meniere disease   . Meniere's disease   . OSA (obstructive sleep apnea)   . Peritoneal dialysis status ([place])     No current facility-administered medications on file prior to encounter.    Current Outpatient Prescriptions on File Prior to Encounter  Medication Sig Dispense Refill  . diphenoxylate-atropine (LOMOTIL) 2.5-0.025 MG tablet TAKE 1 TABLET BY MOUTH FOUR TIMES DAILY AS NEEDED FOR DIARRHEA OR LOOSE STOOLS 30 tablet 5  . folic acid-vitamin b complex-vitamin c-selenium-zinc (DIALYVITE) 3 MG TABS tablet Take 1 tablet by mouth daily. 30 tablet 5  . levothyroxine (SYNTHROID, LEVOTHROID) 25 MCG tablet Take 1 tablet (25 mcg total) by mouth daily. 90 tablet 1  . meclizine (ANTIVERT) 25 MG tablet Take 1 tablet (25 mg total) by mouth 3 (three) times daily as needed for dizziness. 30 tablet 0  . sertraline (ZOLOFT) 50 MG tablet TAKE 3 TABLETS(150 MG) BY MOUTH DAILY 90 tablet 2  . TOPROL XL 25 MG 24 hr tablet TAKE 1/2 BY MOUTH DAILY 30 tablet 11  . traMADol (ULTRAM) 50 MG tablet TAKE 1/2- 1 TABLET BY MOUTH THREE TIMES DAILY AS NEEDED 60 tablet 0  . Vitamin D, Ergocalciferol, (DRISDOL)  50000 units CAPS capsule Take 50,000 Units by mouth every 7 (seven) days.      Past Surgical History:  Procedure Laterality Date  . [place]  . CATARACT EXTRACTION  2006, 2011  . CHOLECYSTECTOMY  01/2008  . INSERTION OF DIALYSIS CATHETER  07/2010  . PACEMAKER INSERTION  2006  . TOTAL KNEE ARTHROPLASTY  2008   LEFT/Dr Calif   Allergies  Allergen Reactions  . Effexor [Venlafaxine]   . Statins Other (See Comments)    Muscle weakness  . Codeine Other (See Comments)    GI UPSET   Family History  Problem Relation Age of Onset  . Cancer Mother     ? ovarian - sarcoma  . Cancer Father     Skin cancer  . Diabetes Sister   . COPD Sister   . Depression Sister   . Cancer Sister     Lung - 106 yrs old   Social History   Social History  . Marital status: Widowed    Spouse name: N/A  . Number of children: 0  . Years of education: N/A   Occupational History  . Retired     Social History Main Topics  . Smoking status: Former Smoker    Quit date: 03/31/1948  . Smokeless tobacco: Never Used  . Alcohol use No  . Drug use: No  . Sexual activity: Not on file   Other Topics Concern  . Not on file  Social History Narrative   Lives in [place] alone. Widow 2012.      Regular Exercise -  Housework   Daily Caffeine Use:  1 - 2 cups coffee            Vitals:   12/14/15 1330 12/14/15 1400  BP: (!) 153/86 (!) 153/76  Pulse: (!) 59 61  Resp: 16 13  Temp:     Xr: Left distal femur AP/Lateral demonstrated a periprosthetic nondisplaced supracondylar distal femur fracture. Femoral implant is cruciate retaining and appears stable.   Physical exam:  General: Patient is alert and interactive, no acute distress HEENT: trachea is midline CV: RRR per palpation Respiratory: symmetric chest rise, no increased work of breathing Abdomen: soft nontender, nondistended MSK: LLE: +effusion at distal lateral femur. Surgical scar noted at knee. +TTP at distal femur  but nontender at ankle or hip. +motor DF/PF/EHL +sensation L4-S1.  Contralateral lower extremity also evaluated. No pain at hip knee or ankle. ROM preserved.  Bilateral upper extremities: no pain at shoulders elbows wrists or hands. M, U, R nn intact.  No pain with palpation or active flexion or extension of cervical spine.   Impression and plan as above.   12/14/15 16:25 At this time, there is an emergent case going and surgery will likely be delayed. Patient may eat. NPO after midnight. Plan for definitive fixation tomorrow.

## 2015-12-14 NOTE — Progress Notes (Signed)
Pre pd assessment  

## 2015-12-14 NOTE — Progress Notes (Signed)
Pt refused CPAP at this time, states she does not wear one at home and she does not sleep long enough. Pt states she does not think she needs a cpap and is refusing it. CPAP at bedside and pt informed to call if she changes her mind.

## 2015-12-14 NOTE — Anesthesia Preprocedure Evaluation (Addendum)
Anesthesia Evaluation  Patient identified by MRN, date of birth, ID band Patient awake    Reviewed: Allergy & Precautions, NPO status , Patient's Chart, lab work & pertinent test results, reviewed documented beta blocker date and time   Airway Mallampati: III  TM Distance: <3 FB   Mouth opening: Limited Mouth Opening  Dental  (+) Chipped, Caps   Pulmonary shortness of breath and with exertion, sleep apnea , former smoker,    Pulmonary exam normal        Cardiovascular hypertension, Pt. on medications and Pt. on home beta blockers + CAD and +CHF  Normal cardiovascular exam+ pacemaker      Neuro/Psych PSYCHIATRIC DISORDERS Anxiety Depression Cervical radiculitis Expressive aphasia  Neuromuscular disease    GI/Hepatic Neg liver ROS, IBS   Endo/Other  negative endocrine ROS  Renal/GU ESRF and DialysisRenal disease  negative genitourinary   Musculoskeletal  (+) Arthritis , Osteoarthritis,  Fx   Abdominal Normal abdominal exam  (+)   Peds negative pediatric ROS (+)  Hematology   Anesthesia Other Findings Past Medical History: No date: CAD (coronary artery disease) No date: Cardiomyopathy [facility]) July 2012: Kidney failure No date: Kidney failure No date: Meniere disease No date: Meniere's disease No date: OSA (obstructive sleep apnea) No date: Peritoneal dialysis status ([place])  Reproductive/Obstetrics                            Anesthesia Physical Anesthesia Plan  ASA: III and emergent  Anesthesia Plan: General   Post-op Pain Management:    Induction:   Airway Management Planned: Oral ETT  Additional Equipment:   Intra-op Plan:   Post-operative Plan: Extubation in OR  Informed Consent: I have reviewed the patients History and Physical, chart, labs and discussed the procedure including the risks, benefits and alternatives for the proposed anesthesia with the patient or authorized  representative who has indicated his/her understanding and acceptance.   Dental advisory given  Plan Discussed with: CRNA and Surgeon  Anesthesia Plan Comments:        Anesthesia Quick Evaluation

## 2015-12-14 NOTE — ED Notes (Signed)
Dr Benjie Karvonen at bedside at this time along with family. Benjie Karvonen, MD given EKG.

## 2015-12-14 NOTE — ED Provider Notes (Signed)
[facility] Emergency Department Provider Note  Time seen: 11:15 AM  I have reviewed the triage vital signs and the nursing notes.   HISTORY  Chief Complaint No chief complaint on file.    HPI Sharon [name] is a 82 y.o. female presents to the emergency department after a fall. According to the patient she was going to the restroom when she tripped falling forward and onto her left knee. Denies hitting her head. Denies LOC. Denies headache, weakness or numbness. States she was unable to get up due to pain in her left knee so she called EMS. Patient describes mild pain with the knee at rest, significant pain with attempted movement of the knee. States mild left hip pain. Denies the use of blood thinners.  Past Medical History:  Diagnosis Date  . Kidney failure July 2012  . Kidney failure   . Meniere disease   . Meniere's disease   . Peritoneal dialysis status [facility])     Patient Active Problem List   Diagnosis Date Noted  . Loss of weight 10/21/2015  . Skin tear of elbow without complication [id number]  . Clinical depression 06/20/2015  . Combined fat and carbohydrate induced hyperlipemia 06/20/2015  . Dysphagia 05/24/2015  . Dyspnea 05/24/2015  . Imbalance 04/22/2015  . Electrical shock sensation in right posterior head 11/22/2014  . Fall at home 11/15/2014  . Expressive aphasia 10/04/2014  . Breathlessness on exertion 09/13/2014  . Chronic pain syndrome 05/22/2014  . Insomnia 03/13/2014  . Anxiety state 03/13/2014  . Arteriosclerosis of coronary artery 01/15/2014  . Chronic systolic heart failure ([place]) 01/15/2014  . Obstructive apnea 01/15/2014  . Basal cell carcinoma of neck 11/14/2013  . Degeneration of intervertebral disc of cervical region 11/07/2013  . Cervical radiculitis 10/16/2013  . Pelvic pain in female 09/12/2013  . Neck pain of over 3 months duration 09/12/2013  . Memory loss 09/12/2013  . Systolic heart failure, chronic ([place])  05/12/2013  . Medicare annual wellness visit, subsequent 11/10/2012  . Sinus node dysfunction ([place]) 08/01/2012  . Low back pain 08/01/2012  . Cervical spine pain 05/02/2012  . Irritable bowel syndrome 11/02/2011  . Depression 04/01/2011  . Hypertension 04/01/2011  . Menopausal disorder 04/01/2011  . Screening for breast cancer 04/01/2011  . Chronic kidney disease, stage 5, kidney failure ([place]) 04/01/2011    Past Surgical History:  Procedure Laterality Date  . [place]  . CATARACT EXTRACTION  2006, 2011  . CHOLECYSTECTOMY  01/2008  . INSERTION OF DIALYSIS CATHETER  07/2010  . PACEMAKER INSERTION  2006  . TOTAL KNEE ARTHROPLASTY  2008   LEFT/Dr Calif    Prior to Admission medications   Medication Sig Start Date End Date Taking? Authorizing Provider  B Complex-C-Folic Acid (DIALYVITE TABLET) TABS TAKE 1 BY MOUTH DAILY 03/28/15   [name], MD  diphenoxylate-atropine (LOMOTIL) 2.5-0.025 MG tablet TAKE 1 TABLET BY MOUTH FOUR TIMES DAILY AS NEEDED FOR DIARRHEA OR LOOSE STOOLS 08/30/15   [name], MD  folic acid-vitamin b complex-vitamin c-selenium-zinc (DIALYVITE) 3 MG TABS tablet Take 1 tablet by mouth daily. 12/08/12   [name], MD  gentamicin ointment (GARAMYCIN) 0.1 %  03/15/12   Historical Provider, MD  levothyroxine (SYNTHROID, LEVOTHROID) 25 MCG tablet Take 1 tablet (25 mcg total) by mouth daily. 10/25/15   [name], MD  meclizine (ANTIVERT) 25 MG tablet Take 1 tablet (25 mg total) by mouth 3 (three) times daily as needed for  dizziness. 04/22/15   [name], MD  sertraline (ZOLOFT) 50 MG tablet TAKE 3 TABLETS(150 MG) BY MOUTH DAILY 08/30/15   [name], MD  TOPROL XL 25 MG 24 hr tablet TAKE 1/2 BY MOUTH DAILY 02/21/15   [name], MD  traMADol (ULTRAM) 50 MG tablet TAKE 1/2- 1 TABLET BY MOUTH THREE TIMES DAILY AS NEEDED 10/21/15   [name], MD  Vitamin D, Ergocalciferol, (DRISDOL) 50000 units CAPS capsule   04/17/15   Historical Provider, MD    Allergies  Allergen Reactions  . Effexor [Venlafaxine]   . Statins Other (See Comments)    Muscle weakness  . Codeine Other (See Comments)    GI UPSET    Family History  Problem Relation Age of Onset  . Cancer Mother     ? ovarian - sarcoma  . Cancer Father     Skin cancer  . Diabetes Sister   . COPD Sister   . Depression Sister   . Cancer Sister     Lung - 34 yrs old    Social History Social History  Substance Use Topics  . Smoking status: Former Smoker    Quit date: 03/31/1948  . Smokeless tobacco: Never Used  . Alcohol use No    Review of Systems Constitutional: Negative for fever Cardiovascular: Negative for chest pain. Respiratory: Negative for shortness of breath. Gastrointestinal: Negative for abdominal pain Musculoskeletal: Left knee pain Neurological: Negative for headaches, focal weakness or numbness. 10-point ROS otherwise negative.  ____________________________________________   PHYSICAL EXAM:  VITAL SIGNS: ED Triage Vitals  Enc Vitals Group     BP      Pulse      Resp      Temp      Temp src      SpO2      Weight      Height      Head Circumference      Peak Flow      Pain Score      Pain Loc      Pain Edu?      Excl. in [place]?     Constitutional: Alert and oriented. Well appearing and in no distress. Eyes: Normal exam ENT   Head: Normocephalic and atraumatic.   Mouth/Throat: Mucous membranes are moist. Cardiovascular: Normal rate, regular rhythm. Respiratory: Normal respiratory effort without tachypnea nor retractions. Breath sounds are clear  Gastrointestinal: Soft and nontender.  Musculoskeletal: Moderate left knee pain. Neurovascularly intact distally. Mild left hip tenderness. Neurologic:  Normal speech and language. No gross focal neurologic deficits  Skin:  Skin is warm, dry and intact.  Psychiatric: Mood and affect are normal.   ____________________________________________      RADIOLOGY  Chest x-ray negative Oblique nondisplaced distal femoral fracture extending to the prosthesis.  ____________________________________________   INITIAL IMPRESSION / ASSESSMENT AND PLAN / ED COURSE  Pertinent labs & imaging results that were available during my care of the patient were reviewed by me and considered in my medical decision making (see chart for details).  Patient presents to the emergency department with left knee pain after fall. Moderate tenderness palpation. Status post knee replacement 10 years ago. Mild left hip tenderness. We'll obtain x-rays of both to further evaluate. Denies hitting her head. Denies LOC.  Discussed the patient with orthopedics given an oblique distal femur fracture on x-ray. Patient will be admitted to the medical service. Orthopedics has been in to see the patient and is planning to  repair likely today.   ____________________________________________   FINAL CLINICAL IMPRESSION(S) / ED DIAGNOSES  Left knee pain Fall Left distal femur fracture   Harvest Dark, MD 12/14/15 1419

## 2015-12-14 NOTE — Progress Notes (Signed)
Family Meeting Note  Advance Directive:yes  Today a meeting took place with the Patient.    The following clinical team members were present during this meeting:MD RN  The following were discussed:Patient's diagnosis femoral fx mechanical fall with ESRD on PD Patient's progosis: Unable to determine and Goals for treatment: Full Code  Additional follow-up to be provided: patient has advanced directives and in he event she is intubated and their is no quality of life she would want to be take off vent, her brother is in charge of her medical decisions if she cannot make them.   Time spent during discussion:16 minutes  Sharon Araque, MD

## 2015-12-14 NOTE — ED Triage Notes (Signed)
Pt reports that she is always more off balance in the morning when she gets up. Pt states that she fell landing on her left knee. Pt has swelling and deformity noted to the left knee

## 2015-12-14 NOTE — Progress Notes (Signed)
Pt PD tx started. Pt alert, vss, no c./o. Report from primary Deri Fuelling, RN. Pt pre PD weight 51.8 kg. Pt very self sufficient and independent. Refused dressing change.

## 2015-12-14 NOTE — Care Management (Signed)
Appears to be a chronic peritoneal dialysis patient.  Faxed demographic, H/P, ortho consult note to Alda Lea

## 2015-12-15 ENCOUNTER — Encounter: Payer: Self-pay | Admitting: Anesthesiology

## 2015-12-15 ENCOUNTER — Inpatient Hospital Stay: Payer: Medicare Other | Admitting: Anesthesiology

## 2015-12-15 ENCOUNTER — Encounter
Admission: EM | Disposition: A | Discharge status: Skilled Nursing Facility | Payer: Self-pay | Source: Home / Self Care | Attending: Internal Medicine

## 2015-12-15 ENCOUNTER — Inpatient Hospital Stay: Payer: Medicare Other

## 2015-12-15 DIAGNOSIS — L8990 Pressure ulcer of unspecified site, unspecified stage: Secondary | ICD-10-CM | POA: Insufficient documentation

## 2015-12-15 DIAGNOSIS — S7222XA Displaced subtrochanteric fracture of left femur, initial encounter for closed fracture: Secondary | ICD-10-CM | POA: Diagnosis present

## 2015-12-15 HISTORY — PX: FEMUR IM NAIL: SHX1597

## 2015-12-15 LAB — CBC
HCT: 27.5 % — ABNORMAL LOW (ref 35.0–47.0)
HEMOGLOBIN: 9.5 g/dL — AB (ref 12.0–16.0)
MCH: 30.7 pg (ref 26.0–34.0)
MCHC: 34.7 g/dL (ref 32.0–36.0)
MCV: 88.3 fL (ref 80.0–100.0)
PLATELETS: 174 10*3/uL (ref 150–440)
RBC: 3.11 MIL/uL — ABNORMAL LOW (ref 3.80–5.20)
RDW: 13.9 % (ref 11.5–14.5)
WBC: 6.2 10*3/uL (ref 3.6–11.0)

## 2015-12-15 LAB — BASIC METABOLIC PANEL
ANION GAP: 7 (ref 5–15)
BUN: 37 mg/dL — AB (ref 6–20)
CALCIUM: 8.5 mg/dL — AB (ref 8.9–10.3)
CO2: 25 mmol/L (ref 22–32)
CREATININE: 1.97 mg/dL — AB (ref 0.44–1.00)
Chloride: 105 mmol/L (ref 101–111)
GFR calc Af Amer: 25 mL/min — ABNORMAL LOW (ref >60)
GFR, EST NON AFRICAN AMERICAN: 22 mL/min — AB (ref >60)
GLUCOSE: 123 mg/dL — AB (ref 65–99)
Potassium: 3 mmol/L — ABNORMAL LOW (ref 3.5–5.1)
Sodium: 137 mmol/L (ref 135–145)

## 2015-12-15 SURGERY — INSERTION, INTRAMEDULLARY ROD, FEMUR, RETROGRADE
Anesthesia: General | Laterality: Left

## 2015-12-15 MED ORDER — SUCCINYLCHOLINE CHLORIDE 20 MG/ML IJ SOLN
INTRAMUSCULAR | Status: DC | PRN
  Administered 2015-12-15: 80 mg via INTRAVENOUS

## 2015-12-15 MED ORDER — ENOXAPARIN SODIUM 30 MG/0.3ML ~~LOC~~ SOLN
30.0000 mg | SUBCUTANEOUS | Status: DC
  Administered 2015-12-16: 30 mg via SUBCUTANEOUS
  Filled 2015-12-15: qty 0.3

## 2015-12-15 MED ORDER — ETOMIDATE 2 MG/ML IV SOLN
INTRAVENOUS | Status: DC | PRN
  Administered 2015-12-15: 10 mg via INTRAVENOUS

## 2015-12-15 MED ORDER — EPHEDRINE SULFATE 50 MG/ML IJ SOLN
INTRAMUSCULAR | Status: DC | PRN
  Administered 2015-12-15: 5 mg via INTRAVENOUS

## 2015-12-15 MED ORDER — CEFAZOLIN IN D5W 1 GM/50ML IV SOLN
1.0000 g | Freq: Four times a day (QID) | INTRAVENOUS | Status: AC
  Administered 2015-12-15 – 2015-12-16 (×3): 1 g via INTRAVENOUS
  Filled 2015-12-15 (×3): qty 50

## 2015-12-15 MED ORDER — CEFAZOLIN SODIUM 1 G IJ SOLR
INTRAMUSCULAR | Status: DC | PRN
  Administered 2015-12-15: 1 g via INTRAMUSCULAR

## 2015-12-15 MED ORDER — FENTANYL CITRATE (PF) 100 MCG/2ML IJ SOLN
INTRAMUSCULAR | Status: DC | PRN
  Administered 2015-12-15: 50 ug via INTRAVENOUS

## 2015-12-15 MED ORDER — FENTANYL CITRATE (PF) 100 MCG/2ML IJ SOLN
INTRAMUSCULAR | Status: AC
  Administered 2015-12-15: 25 ug via INTRAVENOUS
  Filled 2015-12-15: qty 2

## 2015-12-15 MED ORDER — LACTATED RINGERS IV SOLN
INTRAVENOUS | Status: DC | PRN
  Administered 2015-12-15: 12:00:00 via INTRAVENOUS

## 2015-12-15 MED ORDER — ONDANSETRON HCL 4 MG/2ML IJ SOLN: 4.0000 mg | Freq: Once | INTRAMUSCULAR | Status: DC | PRN

## 2015-12-15 MED ORDER — LIDOCAINE HCL (CARDIAC) 20 MG/ML IV SOLN
INTRAVENOUS | Status: DC | PRN
  Administered 2015-12-15: 40 mg via INTRAVENOUS

## 2015-12-15 MED ORDER — METOCLOPRAMIDE HCL 10 MG PO TABS: 5.0000 mg | ORAL_TABLET | Freq: Three times a day (TID) | ORAL | Status: DC | PRN

## 2015-12-15 MED ORDER — ONDANSETRON HCL 4 MG/2ML IJ SOLN
INTRAMUSCULAR | Status: DC | PRN
  Administered 2015-12-15: 4 mg via INTRAVENOUS

## 2015-12-15 MED ORDER — ONDANSETRON HCL 4 MG/2ML IJ SOLN: 4.0000 mg | Freq: Once | INTRAMUSCULAR | Status: DC

## 2015-12-15 MED ORDER — MORPHINE SULFATE (PF) 2 MG/ML IV SOLN: 2.0000 mg | INTRAVENOUS | Status: DC | PRN

## 2015-12-15 MED ORDER — METOCLOPRAMIDE HCL 5 MG/ML IJ SOLN: 5.0000 mg | Freq: Three times a day (TID) | INTRAMUSCULAR | Status: DC | PRN

## 2015-12-15 MED ORDER — FENTANYL CITRATE (PF) 100 MCG/2ML IJ SOLN
25.0000 ug | INTRAMUSCULAR | Status: DC | PRN
  Administered 2015-12-15: 25 ug via INTRAVENOUS

## 2015-12-15 MED ORDER — PHENYLEPHRINE HCL 10 MG/ML IJ SOLN
INTRAMUSCULAR | Status: DC | PRN
  Administered 2015-12-15: 100 ug via INTRAVENOUS
  Administered 2015-12-15 (×2): 50 ug via INTRAVENOUS

## 2015-12-15 SURGICAL SUPPLY — 46 items
3.2 guidewire ×3 IMPLANT
BANDAGE ACE 6X5 VEL STRL LF (GAUZE/BANDAGES/DRESSINGS) IMPLANT
BANDAGE ELASTIC 4 LF NS (GAUZE/BANDAGES/DRESSINGS) IMPLANT
BIT DRILL CALIBRATED 4.2 (BIT) ×1 IMPLANT
BIT DRILL SHORT 4.2 (BIT) ×1 IMPLANT
CANISTER SUCT 1200ML W/VALVE (MISCELLANEOUS) ×3 IMPLANT
CHLORAPREP W/TINT 26ML (MISCELLANEOUS) ×3 IMPLANT
DRAPE C-ARM XRAY 36X54 (DRAPES) ×3 IMPLANT
DRAPE C-ARMOR (DRAPES) ×3 IMPLANT
DRAPE IMP U-DRAPE 54X76 (DRAPES) ×6 IMPLANT
DRAPE INCISE IOBAN 66X45 STRL (DRAPES) ×3 IMPLANT
DRILL BIT CALIBRATED 4.2 (BIT) ×3
DRILL BIT SHORT 4.2 (BIT) ×2
DRSG OPSITE POSTOP 3X4 (GAUZE/BANDAGES/DRESSINGS) ×3 IMPLANT
ELECT CAUTERY BLADE 6.4 (BLADE) ×3 IMPLANT
ELECT REM PT RETURN 9FT ADLT (ELECTROSURGICAL) ×3
ELECTRODE REM PT RTRN 9FT ADLT (ELECTROSURGICAL) ×1 IMPLANT
GAUZE PETRO XEROFOAM 1X8 (MISCELLANEOUS) IMPLANT
GAUZE SPONGE 4X4 12PLY STRL (GAUZE/BANDAGES/DRESSINGS) ×3 IMPLANT
GLOVE BIO SURGEON STRL SZ8 (GLOVE) ×9 IMPLANT
GLOVE INDICATOR 8.0 STRL GRN (GLOVE) ×6 IMPLANT
GOWN STRL REUS W/ TWL LRG LVL3 (GOWN DISPOSABLE) ×2 IMPLANT
GOWN STRL REUS W/ TWL XL LVL3 (GOWN DISPOSABLE) IMPLANT
GOWN STRL REUS W/TWL LRG LVL3 (GOWN DISPOSABLE) ×4
GOWN STRL REUS W/TWL XL LVL3 (GOWN DISPOSABLE)
KIT RM TURNOVER STRD PROC AR (KITS) ×3 IMPLANT
NEEDLE FILTER BLUNT 18X 1/2SAF (NEEDLE)
NEEDLE FILTER BLUNT 18X1 1/2 (NEEDLE) IMPLANT
NS IRRIG 1000ML POUR BTL (IV SOLUTION) ×3 IMPLANT
PACK TOTAL KNEE (MISCELLANEOUS) ×3 IMPLANT
PAD ABD DERMACEA PRESS 5X9 (GAUZE/BANDAGES/DRESSINGS) IMPLANT
PADDING CAST 4IN STRL (MISCELLANEOUS)
PADDING CAST BLEND 4X4 STRL (MISCELLANEOUS) IMPLANT
PADDING CAST BLEND 6X4 STRL (MISCELLANEOUS) IMPLANT
PADDING STRL CAST 6IN (MISCELLANEOUS)
PLATE TI CANN 11X340 (Plate) ×3 IMPLANT
REAMER ROD DEEP FLUTE 2.5X950 (INSTRUMENTS) ×3 IMPLANT
SCREW LOCK STAR 5X32 (Screw) ×3 IMPLANT
SCREW LOCK STAR 5X50 (Screw) ×3 IMPLANT
SCREW LOCK STAR 5X70 (Screw) ×3 IMPLANT
STAPLER SKIN PROX 35W (STAPLE) ×3 IMPLANT
SUT TICRON 2-0 30IN 311381 (SUTURE) ×3 IMPLANT
SUT VIC AB 0 CT1 36 (SUTURE) ×3 IMPLANT
SUT VIC AB 2-0 CT1 27 (SUTURE) ×2
SUT VIC AB 2-0 CT1 TAPERPNT 27 (SUTURE) ×1 IMPLANT
SYRINGE 10CC LL (SYRINGE) ×3 IMPLANT

## 2015-12-15 NOTE — Progress Notes (Signed)
PD complete 

## 2015-12-15 NOTE — Addendum Note (Signed)
Addendum  created 12/15/15 1755 by Ellard Nan, MD   Sign clinical note    

## 2015-12-15 NOTE — Progress Notes (Signed)
[facility]  ROUNDING NOTE   Subjective:   Sharon [name] was admitted on 12/14/2015 on Femur fracture, left ([place]) [S72.92XA]   Peritoneal dialysis last night. Tolerated treatment well. 1.5%  Objective:  Vital signs in last 24 hours:  Temp:  [97.8 F (36.6 C)-98.3 F (36.8 C)] 98.2 F (36.8 C) (11/19 0818) Pulse Rate:  [58-83] 58 (11/19 0818) Resp:  [12-19] 18 (11/19 0818) BP: (127-161)/(48-110) 134/48 (11/19 0818) SpO2:  [97 %-100 %] 99 % (11/19 0818) Weight:  [48.5 kg (107 lb)] 48.5 kg (107 lb) (11/18 1136)  Weight change:  Filed Weights   12/14/15 1136  Weight: 48.5 kg (107 lb)    Intake/Output: I/O last 3 completed shifts: In: 305 [P.O.:100; I.V.:205] Out: 360 [Urine:360]   Intake/Output this shift:  Total I/O In: -  Out: 626 [Other:626]  Physical Exam: General: NAD, laying in bed  Head: Normocephalic, atraumatic. Moist oral mucosal membranes  Eyes: Anicteric, PERRL  Neck: Supple, trachea midline  Lungs:  Clear to auscultation  Heart: Regular rate and rhythm  Abdomen:  Soft, nontender,   Extremities: Left in traction  Neurologic: Nonfocal, moving all four extremities  Skin: No lesions  Access: PD catheter clean exit site    Basic Metabolic Panel:  Recent Labs Lab 12/14/15 1320 12/15/15 0445  NA 139 137  K 3.5 3.0*  CL 104 105  CO2 29 25  GLUCOSE 86 123*  BUN 45* 37*  CREATININE 2.13* 1.97*  CALCIUM 9.1 8.5*    Liver Function Tests:  Recent Labs Lab 12/14/15 1320  AST 34  ALT 16  ALKPHOS 57  BILITOT 0.9  PROT 6.4*  ALBUMIN 3.4*   No results for input(s): LIPASE, AMYLASE in the last 168 hours. No results for input(s): AMMONIA in the last 168 hours.  CBC:  Recent Labs Lab 12/14/15 1320 12/15/15 0445  WBC 8.5 6.2  HGB 11.6* 9.5*  HCT 33.8* 27.5*  MCV 90.1 88.3  PLT 216 174    Cardiac Enzymes: No results for input(s): CKTOTAL, CKMB, CKMBINDEX, TROPONINI in the last 168 hours.  BNP: Invalid input(s):  POCBNP  CBG: No results for input(s): GLUCAP in the last 168 hours.  Microbiology: Results for orders placed or performed during the hospital encounter of 12/14/15  MRSA PCR Screening     Status: None   Collection Time: 12/14/15  4:43 PM  Result Value Ref Range Status   MRSA by PCR NEGATIVE NEGATIVE Final    Comment:        The GeneXpert MRSA Assay (FDA approved for NASAL specimens only), is one component of a comprehensive MRSA colonization surveillance program. It is not intended to diagnose MRSA infection nor to guide or monitor treatment for MRSA infections.     Coagulation Studies:  Recent Labs  12/14/15 1320  LABPROT 13.5  INR 1.03    Urinalysis:  Recent Labs  12/14/15 1812  COLORURINE YELLOW*  LABSPEC 1.012  PHURINE 6.0  GLUCOSEU NEGATIVE  HGBUR NEGATIVE  BILIRUBINUR NEGATIVE  KETONESUR NEGATIVE  PROTEINUR 100*  NITRITE NEGATIVE  LEUKOCYTESUR NEGATIVE      Imaging: Dg Chest 1 View  Result Date: 12/14/2015 CLINICAL DATA:  Fall. Distal femur fracture. Pre-op respiratory exam EXAM: CHEST 1 VIEW COMPARISON:  11/28/2014 FINDINGS: Heart size remains within normal limits. Ectasia and atherosclerotic calcification of thoracic aorta is unchanged. Dual lead transvenous pacemaker remains in appropriate position. Both lungs are clear. No evidence of pneumothorax or pleural effusion. Mild pulmonary hyperinflation again noted. IMPRESSION: No  active disease. Aortic atherosclerosis. Electronically Signed   By: Earle Gell M.D.   On: 12/14/2015 13:10   Dg Knee Complete 4 Views Left  Result Date: 12/14/2015 CLINICAL DATA:  [place] on left knee this morning.  Distal femur pain. EXAM: LEFT KNEE - COMPLETE 4+ VIEW COMPARISON:  06/15/2006 FINDINGS: Changes of left knee replacement. There is an oblique nondisplaced fracture through the distal femoral shaft and metaphysis, best seen on the the oblique view. This is also seen on the lateral view extending to the distal femoral  prosthesis. There is a small joint effusion. IMPRESSION: Oblique nondisplaced distal femoral fracture extending to the prosthesis. Prior left knee replacement. Small left effusion. Electronically Signed   By: Rolm Baptise M.D.   On: 12/14/2015 11:52   Dg Hip Unilat W Or Wo Pelvis 2-3 Views Left  Result Date: 12/14/2015 CLINICAL DATA:  Left knee pain.  Fall. EXAM: DG HIP (WITH OR WITHOUT PELVIS) 2-3V LEFT COMPARISON:  None. FINDINGS: Mild symmetric degenerative changes in the hips bilaterally. SI joints are symmetric and unremarkable. No acute bony abnormality. Specifically, no fracture, subluxation, or dislocation. Soft tissues are intact. Peritoneal dialysis catheter projects over the pelvis. IMPRESSION: No acute bony abnormality. Electronically Signed   By: Rolm Baptise M.D.   On: 12/14/2015 11:52     Medications:    . dialysis solution 1.5% low-MG/low-CA   Intraperitoneal Q24H  . gentamicin cream  1 application Topical Daily  . levothyroxine  25 mcg Oral Daily  . metoprolol succinate  25 mg Oral Daily  . ondansetron (ZOFRAN) IV  4 mg Intravenous Once  . sertraline  50 mg Oral Daily   acetaminophen **OR** acetaminophen, bisacodyl, meclizine, methocarbamol, ondansetron **OR** ondansetron (ZOFRAN) IV, senna-docusate, traMADol  Assessment/ Plan:  Sharon [name] is a 82 y.o. white female with end stage renal disease on peritoneal dialysis, hypertension, anemia of chronic kidney disease, depression, secondary hyperparathyroidism, hyperlipidemia  PD [place].   1. End Stage Renal Disease: peritoneal dialysis last night. Tolerated treatment well.  Prescription of CCPD 8 hours 4 fills with 1.5% dextrose  2. Hypertension: blood pressure at goal.  - metoprolol  3. Anemia of chronic kidney disease: hemoglobin dropped to 9.5 - epo as outpatient, will monitor.   4. Secondary Hyperparathyroidism: outpatient PTH, calcium and phosphorus at goal.  - calcitriol  - not on  binders  5. Left femur fracture: Dr. Juluis Pitch for surgery today.    LOS: [place], [place] 11/19/201710:41 AM

## 2015-12-15 NOTE — Progress Notes (Addendum)
Pt PD started. Post surgery. Pt.alert, vss. Pre PD weight 52.3 kg. Report from primary RN. Total PD tx 8 hours, 10043ml. 5 cycles, 2L per [place] MD. PD dressing change complete and gentamycin cream applied. No s/s of PD catheter infection.

## 2015-12-15 NOTE — Op Note (Signed)
Operative Note  Surgeon: Oletta Cohn DO  Assistants: none  Anesthesia: General endotracheal anesthesia   Preoperative Dx: L periprosthetic distal femur fx  Postoperative Dx: Same  Procedure Open fixation of left periprosthetic femur fracture with retrograde femoral nail Physician guided fluoroscopy <1 hour  Implants: Synthes Retrograde/Antegrade Femoral nail 11x 324mm  Indications for procedure:  Mrs Sharon [name] is an 82 year old female who sustained a ground level fall and subsequent left periprosthetic distal femur fracture. She has a history of falls. After risks benefits and alternatives to procedure were discussed, questions were sought and answered and patient signed consent and wished to proceed.   Patient was taken to the operative suite and placed in the supine position, all bony prominences were padded. She was placed under general anesthesia per anesthesiologist. The correct lower extremity was prepped and draped in the usual fashion. Patient was given preoperative antibiotics prior to incision. Time out was performed and all in the room agreed. A midline incision was made over the patellar tendon and the tendon was split. A 3.2 mm guidewire was introduced in the open box of the femoral component. An appropriate starting point was achieved using fluoroscopy and orthogonal views. Once this was done, the opening reamer was placed. The ball tipped guide wire was then placed up to the level of the lesser trochanter. Images confirmed intramedullary placement of guide wire. Measurement was obtained. A size 340x11 mm nail was chosen. Reamers were used in sequential fashion until a 12 mm was used. The Nail was assembled and placed over the guidewire. This was malleted in gently and the nail was seated. This was confirmed using fluoroscopy. At this time, the jig was assembled. A skin incision was made on the lateral distal femur. The trochar was advanced down to bone. Two 5.61mm distal  interlocking screws were placed after drilling  And measuring the appropriate length. Once this was done. Attention was turned to the proximal femur.  Proximal interlocking screw was placed using the perfect circle technique. This was drilled measured and filled according to standard AO technique. Final images were obtained. The fracture remained reduced. The wounds were irrigated with copious amounts of normal saline. Patellar tendon was closed using 0 vicryl. The subcutaneous tissue was closed using 3-0 vicryl and the skin incision was closed using staples on the lateral and proximal thigh. 3-0 nylon was used to close the anterior knee incision.   Patient recovered from general anesthesia per anesthesiologist. Tolerated procedure well without complications.   Complications: none   Blood loss :75 cc  Urine output and fluids per anesthesiologist.

## 2015-12-15 NOTE — Progress Notes (Signed)
Pre PD assessment 

## 2015-12-15 NOTE — Progress Notes (Signed)
Dialysis machine alarms continue to alarm. Called on call Dialysis RN Sherron Monday. Williams informed me that he is not the on call Dialysis RN today.

## 2015-12-15 NOTE — Progress Notes (Signed)
PD complete. Pt alert, no c/o, vss. Post weight 50.6 kg, UF 313. Kolluru MD aware. Report from RN.

## 2015-12-15 NOTE — Progress Notes (Signed)
PD start 

## 2015-12-15 NOTE — Progress Notes (Signed)
Pt is refusing to wear CPAP. Cpap pulled from pt room. Pt encouraged to call should she change her mind about CPAP usage

## 2015-12-15 NOTE — Anesthesia Procedure Notes (Signed)
Procedure Name: Intubation Date/Time: 12/15/2015 11:39 AM Performed by: Hedda Slade Pre-anesthesia Checklist: Patient identified, Emergency Drugs available, Suction available and Patient being monitored Patient Re-evaluated:Patient Re-evaluated prior to inductionOxygen Delivery Method: Circle system utilized Preoxygenation: Pre-oxygenation with 100% oxygen Intubation Type: IV induction Ventilation: Mask ventilation without difficulty Laryngoscope Size: Mac and 3 Grade View: Grade I Tube type: Oral Tube size: 6.5 mm Number of attempts: 1 Airway Equipment and Method: Stylet Placement Confirmation: ETT inserted through vocal cords under direct vision,  positive ETCO2 and breath sounds checked- equal and bilateral Secured at: 20 cm Tube secured with: Tape Dental Injury: Teeth and Oropharynx as per pre-operative assessment

## 2015-12-15 NOTE — Progress Notes (Signed)
Patient ID: [name], female   DOB: [birth year], 82 y.o.   MRN: [id number]   [facility] [place] NAME: Milbrey Shagena    MR#:  [id number]  DATE OF BIRTH:  07-29-1926  SUBJECTIVE:  CHIEF COMPLAINT:   Chief Complaint  Patient presents with  . Fall   Patient seen and examined.  She reports pain with movement but states that her pain is generally well controlled.  She is NPO and awaiting surgery.  REVIEW OF SYSTEMS:  ROS  Review of Systems  Constitutional: Negative.  Negative for chills, fever and malaise/fatigue.  HENT: Negative.  Negative for ear discharge, ear pain, hearing loss, nosebleeds and sore throat.   Eyes: Negative.  Negative for blurred vision and pain.  Respiratory: Negative.  Negative for cough, hemoptysis, shortness of breath and wheezing.   Cardiovascular: Negative.  Negative for chest pain, palpitations and leg swelling.  Gastrointestinal: Negative.  Negative for abdominal pain, blood in stool, diarrhea, nausea and vomiting.  Genitourinary: Negative.  Negative for dysuria.  Musculoskeletal: Positive for falls and joint pain. Negative for back pain.  Skin: Negative.   Neurological:. Negative for dizziness, tremors, speech change, focal weakness, seizures and headaches.  Endo/Heme/Allergies: Negative.  Does not bruise/bleed easily.  Psychiatric/Behavioral: Negative.  Negative for depression, hallucinations and suicidal ideas.  DRUG ALLERGIES:   Allergies  Allergen Reactions  . Effexor [Venlafaxine]   . Statins Other (See Comments)    Muscle weakness  . Codeine Other (See Comments)    GI UPSET   VITALS:  Blood pressure (!) 138/57, pulse 63, temperature 97.8 F (36.6 C), resp. rate 13, height 5\' 4"  (1.626 m), weight 48.5 kg (107 lb), SpO2 91 %. PHYSICAL EXAMINATION:  Physical Exam  Constitutional: She is oriented to person, place, and time and well-developed, well-nourished, and in no distress. No distress.   HENT:  Head: Normocephalic.  Eyes: No scleral icterus.  Neck: Normal range of motion. Neck supple. No JVD present. No tracheal deviation present.  Cardiovascular: Normal rate and regular rhythm.  Exam reveals no gallop and no friction rub.   Murmur heard. Pulmonary/Chest: Effort normal and breath sounds normal. No respiratory distress. She has no wheezes. She has no rales. She exhibits no tenderness.  Abdominal: Soft. Bowel sounds are normal. She exhibits no distension and no mass. There is no tenderness. There is no rebound and no guarding.  Musculoskeletal: She exhibits no edema.  Left knee swelling with decreased range of motion due to pain  Neurological: She is alert and oriented to person, place, and time.  Skin: Skin is warm. No rash noted. No erythema.  Psychiatric: Affect and judgment normal.   LABORATORY PANEL:   CBC  Recent Labs Lab 12/15/15 0445  WBC 6.2  HGB 9.5*  HCT 27.5*  PLT 174   ------------------------------------------------------------------------------------------------------------------ Chemistries   Recent Labs Lab 12/14/15 1320 12/15/15 0445  NA 139 137  K 3.5 3.0*  CL 104 105  CO2 29 25  GLUCOSE 86 123*  BUN 45* 37*  CREATININE 2.13* 1.97*  CALCIUM 9.1 8.5*  AST 34  --   ALT 16  --   ALKPHOS 57  --   BILITOT 0.9  --    RADIOLOGY:  Dg C-arm 61-120 Min  Result Date: 12/15/2015 CLINICAL DATA:  Internal fixation, left femoral nail. EXAM: DG C-ARM 61-120 MIN; LEFT FEMUR 2 VIEWS COMPARISON:  Plain films 12/14/2015 FINDINGS: Placement of intra medullary nail across the distal femoral fracture.  Anatomic alignment. No visible hardware complicating feature. IMPRESSION: Internal fixation across the distal femoral fracture. No complicating feature. Electronically Signed   By: Rolm Baptise M.D.   On: 12/15/2015 13:26   Dg Femur Min 2 Views Left  Result Date: 12/15/2015 CLINICAL DATA:  Internal fixation, left femoral nail. EXAM: DG C-ARM 61-120  MIN; LEFT FEMUR 2 VIEWS COMPARISON:  Plain films 12/14/2015 FINDINGS: Placement of intra medullary nail across the distal femoral fracture. Anatomic alignment. No visible hardware complicating feature. IMPRESSION: Internal fixation across the distal femoral fracture. No complicating feature. Electronically Signed   By: Rolm Baptise M.D.   On: 12/15/2015 13:26   ASSESSMENT AND PLAN:   82 year old female with left knee prosthesis status post mechanical fall and now with distal femoral fracture extending to prosthesis.  1. Distal femoral fracture: Patient may proceed to operating room without further cardiac workup DVT prophylactic management as per orthopedics. Continue pain control and will need PT and clinical social worker after surgery. Gentle with IVF due to Ef 20%  Hypokalemia - electrolyte managemener per renal.   Anemia - monitor CBC   2. Hypothyroid: Continue Synthroid  3. Chronic systolic heart failure, New York Heart Association class III, EF 20%: Continue beta blocker. Not on ACE inhibitor due to intolerance as per cardiology note  4. End-stage renal disease on peritoneal dialysis: I spoke with nephrology who will see patient today.  5. OSA: CPAP will be ordered if she wants to use it.  6. CAD followed by cardiology: Continue beta blocker. Patient has been intolerant to ACE inhibitor and not on diuretics due to chronic kidney disease. She is not on statin due to significant side effects as per cardiology note. She would benefit from a baby aspirin at discharge.  7. Pacemaker status  All the records are reviewed and case discussed with ED provider. Management plans discussed with the patient and she is in agreement  CODE STATUS: FULL  TOTAL TIME TAKING CARE OF THIS PATIENT: 30 minutes.   More than 50% of the time was spent in counseling/coordination of care: YES  POSSIBLE D/C IN 3-5 DAYS, DEPENDING ON CLINICAL CONDITION.   Tangia Pinard D.O. on  12/15/2015 at 2:47 PM  Between 7am to 6pm - Pager - [phone]  After 6pm go to [email or web address] - Proofreader  [facility]  Office  [phone]  CC: Primary care physician; [name], MD  Note: This dictation was prepared with Dragon dictation along with smaller phrase technology. Any transcriptional errors that result from this process are unintentional.

## 2015-12-15 NOTE — Anesthesia Postprocedure Evaluation (Signed)
Anesthesia Post Note  Patient: Sharon [name]  Procedure(s) Performed: Procedure(s) (LRB): INTRAMEDULLARY (IM) RETROGRADE FEMORAL NAILING (Left)  Patient location during evaluation: PACU Anesthesia Type: General Level of consciousness: awake and alert and oriented Pain management: pain level controlled Vital Signs Assessment: post-procedure vital signs reviewed and stable Respiratory status: spontaneous breathing Cardiovascular status: blood pressure returned to baseline Anesthetic complications: no    Last Vitals:  Vitals:   12/15/15 0818 12/15/15 1311  BP: (!) 134/48 (!) 156/63  Pulse: (!) 58 68  Resp: 18 15  Temp: 36.8 C 36.6 C    Last Pain:  Vitals:   12/15/15 1311  TempSrc:   PainSc: Asleep                 Kathey Simer

## 2015-12-15 NOTE — Transfer of Care (Signed)
Immediate Anesthesia Transfer of Care Note  Patient: Sharon [name]  Procedure(s) Performed: Procedure(s): INTRAMEDULLARY (IM) RETROGRADE FEMORAL NAILING (Left)  Patient Location: PACU  Anesthesia Type:General  Level of Consciousness: sedated  Airway & Oxygen Therapy: Patient Spontanous Breathing and Patient connected to face mask oxygen  Post-op Assessment: Report given to RN and Post -op Vital signs reviewed and stable  Post vital signs: Reviewed and stable  Last Vitals:  Vitals:   12/15/15 0353 12/15/15 0818  BP: (!) 133/56 (!) 134/48  Pulse: 60 (!) 58  Resp: 19 18  Temp: 36.7 C 36.8 C    Last Pain:  Vitals:   12/15/15 0818  TempSrc: Axillary  PainSc: 9       Patients Stated Pain Goal: 2 ([id number])  Complications: No apparent anesthesia complications

## 2015-12-15 NOTE — Progress Notes (Signed)
Post pd assessment

## 2015-12-16 LAB — COMPREHENSIVE METABOLIC PANEL
ALBUMIN: 2.7 g/dL — AB (ref 3.5–5.0)
ALK PHOS: 55 U/L (ref 38–126)
ALT: 16 U/L (ref 14–54)
ANION GAP: 8 (ref 5–15)
AST: 36 U/L (ref 15–41)
BILIRUBIN TOTAL: 0.7 mg/dL (ref 0.3–1.2)
BUN: 39 mg/dL — AB (ref 6–20)
CALCIUM: 8.2 mg/dL — AB (ref 8.9–10.3)
CO2: 25 mmol/L (ref 22–32)
Chloride: 105 mmol/L (ref 101–111)
Creatinine, Ser: 2.27 mg/dL — ABNORMAL HIGH (ref 0.44–1.00)
GFR calc Af Amer: 21 mL/min — ABNORMAL LOW (ref >60)
GFR calc non Af Amer: 18 mL/min — ABNORMAL LOW (ref >60)
GLUCOSE: 112 mg/dL — AB (ref 65–99)
Potassium: 3.8 mmol/L (ref 3.5–5.1)
Sodium: 138 mmol/L (ref 135–145)
TOTAL PROTEIN: 5.3 g/dL — AB (ref 6.5–8.1)

## 2015-12-16 LAB — CBC
HCT: 25.2 % — ABNORMAL LOW (ref 35.0–47.0)
HEMOGLOBIN: 8.7 g/dL — AB (ref 12.0–16.0)
MCH: 30.6 pg (ref 26.0–34.0)
MCHC: 34.4 g/dL (ref 32.0–36.0)
MCV: 88.8 fL (ref 80.0–100.0)
PLATELETS: 152 10*3/uL (ref 150–440)
RBC: 2.84 MIL/uL — ABNORMAL LOW (ref 3.80–5.20)
RDW: 13.7 % (ref 11.5–14.5)
WBC: 9.9 10*3/uL (ref 3.6–11.0)

## 2015-12-16 MED ORDER — SERTRALINE HCL 100 MG PO TABS: 100.0000 mg | ORAL_TABLET | Freq: Every day | ORAL | Status: DC

## 2015-12-16 MED ORDER — SERTRALINE HCL 50 MG PO TABS
50.0000 mg | ORAL_TABLET | Freq: Every day | ORAL | Status: AC
  Administered 2015-12-16: 50 mg via ORAL
  Filled 2015-12-16: qty 1

## 2015-12-16 MED ORDER — LEVOTHYROXINE SODIUM 50 MCG PO TABS
25.0000 ug | ORAL_TABLET | Freq: Every day | ORAL | Status: DC
  Administered 2015-12-17 – 2015-12-18 (×2): 25 ug via ORAL
  Filled 2015-12-16 (×2): qty 1

## 2015-12-16 MED ORDER — SERTRALINE HCL 50 MG PO TABS: 50.0000 mg | ORAL_TABLET | Freq: Once | ORAL | Status: DC

## 2015-12-16 MED ORDER — HEPARIN SODIUM (PORCINE) 5000 UNIT/ML IJ SOLN
5000.0000 [IU] | Freq: Three times a day (TID) | INTRAMUSCULAR | Status: DC
  Administered 2015-12-17 (×3): 5000 [IU] via SUBCUTANEOUS
  Filled 2015-12-16 (×3): qty 1

## 2015-12-16 MED ORDER — HYDROCODONE-ACETAMINOPHEN 7.5-325 MG PO TABS: 1.0000 | ORAL_TABLET | Freq: Four times a day (QID) | ORAL | Status: DC | PRN

## 2015-12-16 MED ORDER — SENNOSIDES-DOCUSATE SODIUM 8.6-50 MG PO TABS
1.0000 | ORAL_TABLET | Freq: Two times a day (BID) | ORAL | Status: DC
  Administered 2015-12-17: 1 via ORAL
  Filled 2015-12-16 (×2): qty 1

## 2015-12-16 MED ORDER — ENSURE ENLIVE PO LIQD
237.0000 mL | Freq: Three times a day (TID) | ORAL | Status: DC
  Administered 2015-12-16 – 2015-12-17 (×4): 237 mL via ORAL

## 2015-12-16 NOTE — Evaluation (Signed)
Physical Therapy Evaluation Patient Details [name] MRN: [id number] DOB: 05-27-1926 Today's Date: 12/16/2015   History of Present Illness  Patient with fall resulting in distal L hip fx on 12/14/15 at home resulting in ORIF on L distal aspect of hip. Patient with hx of ESRD and systolic CHF. Patient reports hx of frequent falling at home.  Clinical Impression  Patient is a 1 right hand dominant female experiencing decreased function and use on L LE after experiencing a fx along L Hip. Patient requires modA-maxA for bed mobility, amb, and sit<->stand transfers. Patient demonstrates decrease use on L LE and uses R LE to move the L LE  For bed mobility. Patient unable step without shuffling her foot, requires frequent tactile and verbal assistance when performing balancing and stepping activities. Recommend SNF for discharge.     Follow Up Recommendations SNF    Equipment Recommendations  Rolling walker with 5" wheels    Recommendations for Other Services       Precautions / Restrictions Precautions Precautions: Fall Restrictions Weight Bearing Restrictions: Yes LLE Weight Bearing: Weight bearing as tolerated Other Position/Activity Restrictions: LLE WBAT      Mobility  Bed Mobility Overal bed mobility: Needs Assistance Bed Mobility: Sit to Supine;Supine to Sit;Sidelying to Sit;Rolling Rolling: Max assist Sidelying to sit: Mod assist Supine to sit: Max assist Sit to supine: Max assist   General bed mobility comments: Patient slow to perform all bed mobility activities, Requires mod-maxA to perform. Requires constant VC and TC to perform.   Transfers Overall transfer level: Needs assistance Equipment used: Rolling walker (2 wheeled) Transfers: Sit to/from Stand Sit to Stand: Mod assist         General transfer comment: Patient required minA to perform sit <->stand transfer, requiring frequent cueing on B UE and LE positioning throughout movement. Patient is  very hessistant and slow with movement.   Ambulation/Gait Ambulation/Gait assistance: Mod assist Ambulation Distance (Feet): 2 Feet Assistive device: Rolling walker (2 wheeled) Gait Pattern/deviations: Step-to pattern;Decreased step length - right;Decreased step length - left;Decreased stance time - left;Decreased weight shift to left;Shuffle;Narrow base of support   Gait velocity interpretation: Below normal speed for age/gender General Gait Details: Shuffling bilaterally requiring frequent cueing on joint positioning throughout ambulation. Short steps taken to sit on commode at bed side.   Stairs            Wheelchair Mobility    Modified Rankin (Stroke Patients Only)       Balance Overall balance assessment: History of Falls;Needs assistance Sitting-balance support: Bilateral upper extremity supported Sitting balance-Leahy Scale: Fair Sitting balance - Comments: Able to sit at EOB and requires constant support to perform   Standing balance support: Bilateral upper extremity supported Standing balance-Leahy Scale: Poor Standing balance comment: Requires minA to perform standing balance activities                             Pertinent Vitals/Pain Pain Assessment: 0-10 Pain Score: 6  Pain Location: Along lateral aspect of distal and proximal L femr Pain Descriptors / Indicators: Aching;Sharp Pain Intervention(s): Monitored during session;Repositioned;Limited activity within patient's tolerance    Home Living Family/patient expects to be discharged to:: Skilled nursing facility Living Arrangements: Alone                 Additional Comments: Nephew stays with her frequently; 4-5 nights a week    Prior Function Level of Independence: Independent with  assistive device(s)         Comments: Patient reports constant falls when sitting on EOB at home     Hand Dominance   Dominant Hand: Right    Extremity/Trunk Assessment   Upper Extremity  Assessment: Overall WFL for tasks assessed;Generalized weakness           Lower Extremity Assessment: Generalized weakness;LLE deficits/detail   LLE Deficits / Details: Decreased LLE acitivity secondary to pain. Decreased quad/hip flexor activity. Decreased dorsiflexion/plantarflexion strength     Communication   Communication: HOH  Cognition Arousal/Alertness: Awake/alert Behavior During Therapy: WFL for tasks assessed/performed Overall Cognitive Status: Within Functional Limits for tasks assessed                      General Comments      Exercises Other Exercises Other Exercises: Sit to stand performed x3 with modA to perform. Standing pivot transfer performed to commode.  Other Exercises: SAQ x 5 bilaterally. Required tactile cueing and support to perform SAQ on the R. Unable to perform quad sets on R with frequent tactile and verbal cueing. Other Exercises: Amb 2 ft with min-modA with frequent cueing on foot and hip positioning throughout motion.    Assessment/Plan    PT Assessment Patient needs continued PT services  PT Problem List Decreased strength;Decreased range of motion;Decreased activity tolerance;Decreased balance;Decreased mobility;Decreased coordination;Pain          PT Treatment Interventions Gait training;Neuromuscular re-education;Therapeutic activities;Therapeutic exercise;Balance training;Functional mobility training;Patient/family education    PT Goals (Current goals can be found in the Care Plan section)  Acute Rehab PT Goals Patient Stated Goal: To be able to walk and move easier without pain PT Goal Formulation: With patient Time For Goal Achievement: 12/30/15 Potential to Achieve Goals: Fair    Frequency BID   Barriers to discharge Inaccessible home environment Decreased activity tolerance, strength, family support at home. Requires amb on 53ft up ramp to enter home    Co-evaluation               End of Session Equipment  Utilized During Treatment: Gait belt Activity Tolerance: Patient tolerated treatment well;Patient limited by fatigue;Patient limited by pain Patient left: in bed;with SCD's reapplied;with call bell/phone within reach;with bed alarm set;with family/visitor present Nurse Communication: Other (comment);Mobility status (Left on room air secondary to SpO2: >92%)         Time: [id number] PT Time Calculation (min) (ACUTE ONLY): 50 min   Charges:   PT Evaluation $PT Eval Moderate Complexity: 1 Procedure PT Treatments $Therapeutic Exercise: 23-37 mins   PT G Codes:        Blythe Stanford, PT DPT 12/16/2015, 11:35 AM

## 2015-12-16 NOTE — Clinical Social Work Placement (Signed)
   CLINICAL SOCIAL WORK PLACEMENT  NOTE  Date:  12/16/2015  Patient Details  [name] MRN: [id number] Date of Birth: 27-Apr-1926  Clinical Social Work is seeking post-discharge placement for this patient at the [place] level of care (*CSW will initial, date and re-position this form in  chart as items are completed):  Yes   Patient/family provided with [facility] Work Department's list of facilities offering this level of care within the geographic area requested by the patient (or if unable, by the patient's family).  Yes   Patient/family informed of their freedom to choose among providers that offer the needed level of care, that participate in Medicare, Medicaid or managed care program needed by the patient, have an available bed and are willing to accept the patient.  Yes   Patient/family informed of [facility]'s ownership interest in [facility] and [facility], as well as of the fact that they are under no obligation to receive care at these facilities.  PASRR submitted to EDS on 12/16/15     PASRR number received on 12/16/15     Existing PASRR number confirmed on       FL2 transmitted to all facilities in geographic area requested by pt/family on 12/16/15     FL2 transmitted to all facilities within larger geographic area on       Patient informed that his/her managed care company has contracts with or will negotiate with certain facilities, including the following:            Patient/family informed of bed offers received.  Patient chooses bed at       Physician recommends and patient chooses bed at      Patient to be transferred to   on  .  Patient to be transferred to facility by       Patient family notified on   of transfer.  Name of family member notified:        PHYSICIAN       Additional Comment:    _______________________________________________ Chidera Dearcos, Veronia Beets, LCSW 12/16/2015, 2:06 PM

## 2015-12-16 NOTE — Progress Notes (Signed)
Initial Nutrition Assessment  DOCUMENTATION CODES:   Severe malnutrition in context of chronic illness  INTERVENTION:  1. Ensure Enlive po TID, each supplement provides 350 kcal and 20 grams of protein  NUTRITION DIAGNOSIS:   Malnutrition related to chronic illness as evidenced by severe depletion of muscle mass, severe depletion of body fat.  GOAL:   Patient will meet greater than or equal to 90% of their needs  MONITOR:   PO intake, I & O's, Labs, Weight trends, Supplement acceptance  REASON FOR ASSESSMENT:   Malnutrition Screening Tool    ASSESSMENT:   Sharon [name]  is a 82 y.o. female with a known history of left knee replacement, chronic Systolic heart failure and end-stage renal disease on peritoneal dialysis who presents to the emergency room after a mechanical fall due to loss of balance and left knee pain.  Spoke with Ms. Hertling, sister-in-law at bedside. Patient was being setup for PD during time of visit. Admits to a 40-50# wt loss over the past 3-4 years. Believes she has gained most of this weight back. Per chart review, pt's weight fluctuates, recently has gained 12# over the past 2 months. States her PO is ok at home - eats 2 meals per day - but her taste buds are "not so great." She also complains of trouble swallowing but states that she manages with food selections. For breakfast she ate 1/4 of a bagel with cream cheese and banana bread that her sister in law brought her. Nutrition-Focused physical exam completed. Findings are severe fat depletion, severe muscle depletion, and no edema.   Labs and medications reviewed   Diet Order:  Diet renal with fluid restriction Fluid restriction: 1200 mL Fluid; Room service appropriate? Yes; Fluid consistency: Thin  Skin:  Wound (see comment) (Stg I To coccyx)  Last BM:  11/17  Height:   Ht Readings from Last 1 Encounters:  12/14/15 5\' 4"  (1.626 m)    Weight:   Wt Readings from Last 1 Encounters:   12/16/15 121 lb 4.1 oz (55 kg)    Ideal Body Weight:  54.54 kg  BMI:  Body mass index is 20.81 kg/m.  Estimated Nutritional Needs:   Kcal:  1215-1460 calories  Protein:  48-60 gm  Fluid:  >/= 1.2L  EDUCATION NEEDS:   No education needs identified at this time  Satira Anis. Wilmarie Sparlin, MS, RD LDN Inpatient Clinical Dietitian Pager [phone]

## 2015-12-16 NOTE — Progress Notes (Signed)
Waiting for director of Dialysis to call 1A back r/t peritoneal dialysis machine alarms.

## 2015-12-16 NOTE — Progress Notes (Signed)
PRE PD ASSESSMENT 

## 2015-12-16 NOTE — Clinical Social Work Note (Signed)
Clinical Social Work Assessment  Patient Details  [name] MRN: [id number] Date of Birth: 12/14/1926  Date of referral:  12/16/15               Reason for consult:  Facility Placement                Permission sought to share information with:  Chartered certified accountant granted to share information::  Yes, Verbal Permission Granted  Name::      [place]::   [place]   Relationship::     Contact Information:     Housing/Transportation Living arrangements for the past 2 months:  [place] of Information:  Patient Patient Interpreter Needed:  None Criminal Activity/Legal Involvement Pertinent to Current Situation/Hospitalization:  No - Comment as needed Significant Relationships:  Other Family Members Lives with:  Relatives, Self Do you feel safe going back to the place where you live?  Yes Need for family participation in patient care:  Yes (Comment)  Care giving concerns:  Patient lives alone in [place] and her great nephew Fulton Reek comes to stay with her at night sometimes.    Social Worker assessment / plan:  Holiday representative (CSW) received SNF consult. Patient is post op day 1 from a hip fracture and PT is recommending SNF. CSW met with patient alone at bedside to discuss D/C plan. Patient was alert and oriented and was sitting up in the bed. CSW introduced self and explained role of CSW department. Patient reported that she mostly lives alone and her great nephew Fulton Reek who she believes has Autism Spectrum Disorder comes to stay with her at night sometimes. Per patient Fulton Reek has been there for her every time she has fallen and took the appropriate action. Patient reported that she has her own cpap machine at home however she rarely uses it. Patient reported that she could get someone to bring her the cpap if she needed it. Patient is on peritoneal dialysis (PD) and would like to remain doing that however she  does not have any family or friends to do it for her.   CSW contacted [employer] to see if they could accommodate patient on PD. [facility] has been one of the only facilities in [place] in the past to take patient's on PD. Per Jonathon they cannot take patient unless she transfers to HD. Patient is aware of above understands that HD is the only option because she has to go to SNF. Patient verbalized her understanding. RN aware of above. CSW paged nephro MD to make him aware of above. FL2 complete faxed out. CSW will continue to follow and assist as needed.     Employment status:  Retired Forensic scientist:  Medicare PT Recommendations:  [place] / Referral to community resources:  [place]  Patient/Family's Response to care:  Patient is agreeable to HD and SNF search in [place].   Patient/Family's Understanding of and Emotional Response to Diagnosis, Current Treatment, and Prognosis:  Patient was pleasant and thanked CSW for assistance.   Emotional Assessment Appearance:  Appears stated age Attitude/Demeanor/Rapport:    Affect (typically observed):  Accepting, Adaptable, Pleasant Orientation:  Oriented to Self, Oriented to Place, Oriented to  Time, Oriented to Situation Alcohol / Substance use:  Not Applicable Psych involvement (Current and /or in the community):  No (Comment)  Discharge Needs  Concerns to be addressed:  Discharge Planning  Concerns Readmission within the last 30 days:  No Current discharge risk:  Dependent with Mobility Barriers to Discharge:  Continued Medical Work up   [employer], Veronia Beets, LCSW 12/16/2015, 2:07 PM

## 2015-12-16 NOTE — Progress Notes (Signed)
Hemodialysis- Arrived to unit to discontinue PD. Patient frustrated saying machine has been "alarming all night so I just cut it off." Machine did not complete first drain cycle. Small amount of fluid in waste bag noted. Approx 400cc. Dr. Candiss Norse notified. Order to reset machine and restart PD.   Staff informed to call [facility] number listed on top of PD cycler if any alarms during the night. Also notified staff to call attending nephrologist to be directed to on call Dialysis RN if needed anytime.

## 2015-12-16 NOTE — NC FL2 (Signed)
[place] LEVEL OF CARE SCREENING TOOL     IDENTIFICATION  Patient [name] Birthdate: 1927/01/03 Sex: female Admission Date (Current Location): 12/14/2015  [place] and [place] Number:  Engineering geologist and Address:  [facility], [address], [place], [place] [address]      Provider Number: [id number]  Attending Physician Name and Address:  [name], MD  Relative Name and Phone Number:       Current Level of Care: Hospital Recommended Level of Care: [address] [place] Prior Approval Number:    Date Approved/Denied:   PASRR Number:  ([id number])  Discharge Plan: SNF    Current Diagnoses: Patient Active Problem List   Diagnosis Date Noted  . Pressure injury of skin 12/15/2015  . Closed left subtrochanteric femur fracture ([place]) 12/15/2015  . Femur fracture, left ([place]) 12/14/2015  . Meniere disease   . Loss of weight 10/21/2015  . Skin tear of elbow without complication [id number]  . Clinical depression 06/20/2015  . Combined fat and carbohydrate induced hyperlipemia 06/20/2015  . Dysphagia 05/24/2015  . Dyspnea 05/24/2015  . Imbalance 04/22/2015  . Electrical shock sensation in right posterior head 11/22/2014  . Fall at home 11/15/2014  . Expressive aphasia 10/04/2014  . Breathlessness on exertion 09/13/2014  . Chronic pain syndrome 05/22/2014  . Insomnia 03/13/2014  . Anxiety state 03/13/2014  . Arteriosclerosis of coronary artery 01/15/2014  . Chronic systolic heart failure ([place]) 01/15/2014  . Obstructive apnea 01/15/2014  . Basal cell carcinoma of neck 11/14/2013  . Degeneration of intervertebral disc of cervical region 11/07/2013  . Cervical radiculitis 10/16/2013  . Pelvic pain in female 09/12/2013  . Neck pain of over 3 months duration 09/12/2013  . Memory loss 09/12/2013  . Systolic heart failure, chronic ([place]) 05/12/2013  . Medicare annual wellness visit, subsequent 11/10/2012  .  Sinus node dysfunction ([place]) 08/01/2012  . Low back pain 08/01/2012  . Cervical spine pain 05/02/2012  . Irritable bowel syndrome 11/02/2011  . Depression 04/01/2011  . Hypertension 04/01/2011  . Menopausal disorder 04/01/2011  . Screening for breast cancer 04/01/2011  . Chronic kidney disease, stage 5, kidney failure ([place]) 04/01/2011    Orientation RESPIRATION BLADDER Height & Weight     Self, Time, Situation, Place  O2 (2 Liters Oxygen ) Continent Weight: 121 lb 4.1 oz (55 kg) Height:  5\' 4"  (162.6 cm)  BEHAVIORAL SYMPTOMS/MOOD NEUROLOGICAL BOWEL NUTRITION STATUS   (none)  (none) Continent Diet (Renal Diet: Fluid Restrictions. )  AMBULATORY STATUS COMMUNICATION OF NEEDS Skin   Extensive Assist Verbally Surgical wounds, PU Stage and Appropriate Care (Incision: Left Leg. Pressure Ulcer Stage 1: Coccyx. )                       Personal Care Assistance Level of Assistance  Bathing, Feeding, Dressing Bathing Assistance: Limited assistance Feeding assistance: Independent Dressing Assistance: Limited assistance     Functional Limitations Info  Sight, Hearing, Speech Sight Info: Adequate Hearing Info: Impaired Speech Info: Adequate    SPECIAL CARE FACTORS FREQUENCY  PT (By licensed PT), OT (By licensed OT)     PT Frequency:  (5) OT Frequency:  (5)            Contractures      Additional Factors Info  Code Status, Allergies Code Status Info:  (Full Code. ) Allergies Info:  (Effexor Venlafaxine, Statins, Codeine)           Current  Medications (12/16/2015):  This is the current hospital active medication list Current Facility-Administered Medications  Medication Dose Route Frequency Provider Last Rate Last Dose  . acetaminophen (TYLENOL) tablet 650 mg  650 mg Oral Q6H PRN Bettey Costa, MD       Or  . acetaminophen (TYLENOL) suppository 650 mg  650 mg Rectal Q6H PRN Bettey Costa, MD      . bisacodyl (DULCOLAX) suppository 10 mg  10 mg Rectal Daily PRN Bettey Costa,  MD      . dialysis solution 1.5% low-MG/low-CA SOLN   Intraperitoneal Q24H Lavonia Dana, MD      . enoxaparin (LOVENOX) injection 30 mg  30 mg Subcutaneous Q24H Keivan Abtahi, DO   30 mg at 12/16/15 0917  . feeding supplement (ENSURE ENLIVE) (ENSURE ENLIVE) liquid 237 mL  237 mL Oral TID BM Srikar Sudini, MD      . gentamicin cream (GARAMYCIN) 0.1 % 1 application  1 application Topical Daily Lavonia Dana, MD   1 application at [id number] 1000  . HYDROcodone-acetaminophen (NORCO) 7.5-325 MG per tablet 1 tablet  1 tablet Oral Q6H PRN [name], MD      . Derrill Memo ON 12/17/2015] levothyroxine (SYNTHROID, LEVOTHROID) tablet 25 mcg  25 mcg Oral QAC breakfast Bettey Costa, MD      . meclizine (ANTIVERT) tablet 25 mg  25 mg Oral TID PRN Bettey Costa, MD      . methocarbamol (ROBAXIN) injection 1,000 mg  1,000 mg Intravenous Q6H PRN Keivan Abtahi, DO   1,000 mg at 12/15/15 1643  . metoCLOPramide (REGLAN) tablet 5-10 mg  5-10 mg Oral Q8H PRN Keivan Abtahi, DO       Or  . metoCLOPramide (REGLAN) injection 5-10 mg  5-10 mg Intravenous Q8H PRN Keivan Abtahi, DO      . metoprolol succinate (TOPROL-XL) 24 hr tablet 25 mg  25 mg Oral Daily Bettey Costa, MD   25 mg at 12/16/15 0927  . morphine 2 MG/ML injection 2 mg  2 mg Intravenous Q3H PRN Keivan Abtahi, DO      . ondansetron (ZOFRAN) tablet 4 mg  4 mg Oral Q6H PRN Bettey Costa, MD       Or  . ondansetron (ZOFRAN) injection 4 mg  4 mg Intravenous Q6H PRN Bettey Costa, MD      . ondansetron (ZOFRAN) injection 4 mg  4 mg Intravenous Once Keivan Abtahi, DO      . senna-docusate (Senokot-S) tablet 1 tablet  1 tablet Oral QHS PRN Bettey Costa, MD      . Derrill Memo ON 12/17/2015] sertraline (ZOLOFT) tablet 100 mg  100 mg Oral QHS Srikar Sudini, MD      . sertraline (ZOLOFT) tablet 50 mg  50 mg Oral QHS Srikar Sudini, MD      . traMADol (ULTRAM) tablet 50 mg  50 mg Oral Q6H PRN Bettey Costa, MD   50 mg at 12/16/15 0919     Discharge Medications: Please see discharge summary  for a list of discharge medications.  Relevant Imaging Results:  Relevant Lab Results:   Additional Information  (SSN: [id number] (Dialysis patient))  Caelen Reierson, Veronia Beets, LCSW

## 2015-12-16 NOTE — Progress Notes (Signed)
[facility]  ROUNDING NOTE   Subjective:   Sharon [name] was admitted on 12/14/2015 on Femur fracture, left ([place]) [S72.92XA]   Peritoneal dialysis could not be done last night due to machine malfunction, therefore PD started this AM Tolerating well No acute issues at present   Objective:  Vital signs in last 24 hours:  Temp:  [98.2 F (36.8 C)-98.3 F (36.8 C)] 98.2 F (36.8 C) (11/20 0746) Pulse Rate:  [64-72] 69 (11/20 0919) Resp:  [18] 18 (11/20 0746) BP: (115-144)/(53-70) 120/65 (11/20 0919) SpO2:  [100 %] 100 % (11/20 0746) Weight:  [55 kg (121 lb 4.1 oz)] 55 kg (121 lb 4.1 oz) (11/20 1118)  Weight change: 3.81 kg (8 lb 6.4 oz) Filed Weights   12/14/15 1136 12/15/15 1544 12/16/15 1118  Weight: 48.5 kg (107 lb) 52.3 kg (115 lb 6.4 oz) 55 kg (121 lb 4.1 oz)    Intake/Output: I/O last 3 completed shifts: In: 450 [I.V.:400; IV Piggyback:50] Out: 1116 [Urine:440; Other:626; Blood:50]   Intake/Output this shift:  Total I/O In: 120 [P.O.:120] Out: 100 [Urine:100]  Physical Exam: General: NAD, laying in bed  Head: Normocephalic, atraumatic. Moist oral mucosal membranes  Eyes: Anicteric,   Neck: Supple, trachea midline  Lungs:  Clear to auscultation  Heart: Regular rate and rhythm  Abdomen:  Soft, nontender,   Extremities: Left leg surgical wraps  Neurologic: Nonfocal, moving all four extremities  Skin: No lesions  Access: PD catheter clean exit site    Basic Metabolic Panel:  Recent Labs Lab 12/14/15 1320 12/15/15 0445 12/16/15 0352  NA 139 137 138  K 3.5 3.0* 3.8  CL 104 105 105  CO2 29 25 25   GLUCOSE 86 123* 112*  BUN 45* 37* 39*  CREATININE 2.13* 1.97* 2.27*  CALCIUM 9.1 8.5* 8.2*    Liver Function Tests:  Recent Labs Lab 12/14/15 1320 12/16/15 0352  AST 34 36  ALT 16 16  ALKPHOS 57 55  BILITOT 0.9 0.7  PROT 6.4* 5.3*  ALBUMIN 3.4* 2.7*   No results for input(s): LIPASE, AMYLASE in the last 168 hours. No results for  input(s): AMMONIA in the last 168 hours.  CBC:  Recent Labs Lab 12/14/15 1320 12/15/15 0445 12/16/15 0352  WBC 8.5 6.2 9.9  HGB 11.6* 9.5* 8.7*  HCT 33.8* 27.5* 25.2*  MCV 90.1 88.3 88.8  PLT 216 174 152    Cardiac Enzymes: No results for input(s): CKTOTAL, CKMB, CKMBINDEX, TROPONINI in the last 168 hours.  BNP: Invalid input(s): POCBNP  CBG: No results for input(s): GLUCAP in the last 168 hours.  Microbiology: Results for orders placed or performed during the hospital encounter of 12/14/15  MRSA PCR Screening     Status: None   Collection Time: 12/14/15  4:43 PM  Result Value Ref Range Status   MRSA by PCR NEGATIVE NEGATIVE Final    Comment:        The GeneXpert MRSA Assay (FDA approved for NASAL specimens only), is one component of a comprehensive MRSA colonization surveillance program. It is not intended to diagnose MRSA infection nor to guide or monitor treatment for MRSA infections.     Coagulation Studies:  Recent Labs  12/14/15 1320  LABPROT 13.5  INR 1.03    Urinalysis:  Recent Labs  12/14/15 1812  COLORURINE YELLOW*  LABSPEC 1.012  PHURINE 6.0  GLUCOSEU NEGATIVE  HGBUR NEGATIVE  BILIRUBINUR NEGATIVE  KETONESUR NEGATIVE  PROTEINUR 100*  NITRITE NEGATIVE  LEUKOCYTESUR NEGATIVE  Imaging: Dg C-arm 61-120 Min  Result Date: 12/15/2015 CLINICAL DATA:  Internal fixation, left femoral nail. EXAM: DG C-ARM 61-120 MIN; LEFT FEMUR 2 VIEWS COMPARISON:  Plain films 12/14/2015 FINDINGS: Placement of intra medullary nail across the distal femoral fracture. Anatomic alignment. No visible hardware complicating feature. IMPRESSION: Internal fixation across the distal femoral fracture. No complicating feature. Electronically Signed   By: Rolm Baptise M.D.   On: 12/15/2015 13:26   Dg Femur Min 2 Views Left  Result Date: 12/15/2015 CLINICAL DATA:  Internal fixation, left femoral nail. EXAM: DG C-ARM 61-120 MIN; LEFT FEMUR 2 VIEWS COMPARISON:   Plain films 12/14/2015 FINDINGS: Placement of intra medullary nail across the distal femoral fracture. Anatomic alignment. No visible hardware complicating feature. IMPRESSION: Internal fixation across the distal femoral fracture. No complicating feature. Electronically Signed   By: Rolm Baptise M.D.   On: 12/15/2015 13:26     Medications:    . dialysis solution 1.5% low-MG/low-CA   Intraperitoneal Q24H  . enoxaparin (LOVENOX) injection  30 mg Subcutaneous Q24H  . feeding supplement (ENSURE ENLIVE)  237 mL Oral TID BM  . gentamicin cream  1 application Topical Daily  . [START ON 12/17/2015] levothyroxine  25 mcg Oral QAC breakfast  . metoprolol succinate  25 mg Oral Daily  . ondansetron (ZOFRAN) IV  4 mg Intravenous Once  . senna-docusate  1 tablet Oral BID  . [START ON 12/17/2015] sertraline  100 mg Oral QHS  . sertraline  50 mg Oral QHS   acetaminophen **OR** acetaminophen, bisacodyl, HYDROcodone-acetaminophen, meclizine, methocarbamol, metoCLOPramide **OR** metoCLOPramide (REGLAN) injection, morphine injection, ondansetron **OR** ondansetron (ZOFRAN) IV, senna-docusate, traMADol  Assessment/ Plan:  [name] is a 82 y.o. white female with end stage renal disease on peritoneal dialysis, hypertension, anemia of chronic kidney disease, depression, secondary hyperparathyroidism, hyperlipidemia  PD [place].   1. End Stage Renal Disease:  Prescription of CCPD 8 hours 4 fills with 1.5% dextrose Patient is expected to be discharged to a rehabilitation facility.  Better doing of dialysis services are not available in rehabilitation facility, Therefore, she will need to be temporarily ceased to hemodialysis until she can recover  - Will obtain consultation from vascular surgery for PermCath placement tomorrow  2. Hypertension: blood pressure at goal.  - metoprolol  3. Anemia of chronic kidney disease: hemoglobin dropped to 8.7 - epo as outpatient, will monitor.    4. Secondary Hyperparathyroidism: outpatient PTH, calcium and phosphorus at goal.  - calcitriol  - not on binders  5. Left femur fracture:  11/19- Open fixation of left periprosthetic femur fracture with retrograde femoral nail   LOS: 2 Sharon [name] 11/20/20173:55 PM

## 2015-12-16 NOTE — Progress Notes (Signed)
Per Nephro MD he will place per cath tomorrow for patient to transition to HD in order to go to SNF. Clinical Social Worker (CSW) will continue to follow and assist as needed.   [employer], LCSW [phone]

## 2015-12-16 NOTE — Plan of Care (Signed)
Problem: Pain Managment: Goal: General experience of comfort will improve Outcome: Not Progressing Has pain occasionally

## 2015-12-16 NOTE — Progress Notes (Signed)
Pt PD started. No complication. Pre weight 55 kg in bed. Pt. Alert, no c/o, vss. Report from Jasper Riling RN. Pt. Did not receive PD last night due to heater alarms on PD machine. HD MD and RN not notified.

## 2015-12-16 NOTE — Progress Notes (Signed)
PD START 

## 2015-12-16 NOTE — Progress Notes (Signed)
Peritoneal Dialysis machine alarming. Nurse attempted to call dialysis with no answer.

## 2015-12-16 NOTE — Progress Notes (Signed)
   Subjective: 1 Day Post-Op Procedure(s) (LRB): INTRAMEDULLARY (IM) RETROGRADE FEMORAL NAILING (Left) Patient reports pain as mild.   Patient is well, and has had no acute complaints or problems We will start therapy today.  Plan is to go Rehab after hospital stay. no nausea and no vomiting Patient denies any chest pains or shortness of breath. Objective: Vital signs in last 24 hours: Temp:  [97.8 F (36.6 C)-98.3 F (36.8 C)] 98.2 F (36.8 C) (11/20 0746) Pulse Rate:  [59-72] 69 (11/20 0919) Resp:  [13-18] 18 (11/20 0746) BP: (115-156)/(48-70) 120/65 (11/20 0919) SpO2:  [91 %-100 %] 100 % (11/20 0746) Weight:  [52.3 kg (115 lb 6.4 oz)] 52.3 kg (115 lb 6.4 oz) (11/19 1544) Heels are non tender and elevated off the bed using rolled towels Intake/Output from previous day: 11/19 0701 - 11/20 0700 In: 450 [I.V.:400; IV Piggyback:50] Out: 856 [Urine:180; Blood:50] Intake/Output this shift: Total I/O In: -  Out: 100 [Urine:100]   Recent Labs  12/14/15 1320 12/15/15 0445 12/16/15 0352  HGB 11.6* 9.5* 8.7*    Recent Labs  12/15/15 0445 12/16/15 0352  WBC 6.2 9.9  RBC 3.11* 2.84*  HCT 27.5* 25.2*  PLT 174 152    Recent Labs  12/15/15 0445 12/16/15 0352  NA 137 138  K 3.0* 3.8  CL 105 105  CO2 25 25  BUN 37* 39*  CREATININE 1.97* 2.27*  GLUCOSE 123* 112*  CALCIUM 8.5* 8.2*    Recent Labs  12/14/15 1320  INR 1.03    EXAM General - Patient is Alert, Appropriate and Oriented Extremity - Neurologically intact Neurovascular intact Sensation intact distally Intact pulses distally Dorsiflexion/Plantar flexion intact No cellulitis present Compartment soft Dressing - dressing C/D/I Motor Function - intact, moving foot and toes well on exam.    Past Medical History:  Diagnosis Date  . CAD (coronary artery disease)   . Cardiomyopathy ([place])   . IBS (irritable bowel syndrome) 2010  . Kidney failure July 2012  . Kidney failure   . Meniere disease   .  Meniere's disease   . Myocardial infarction   . OSA (obstructive sleep apnea)   . Peritoneal dialysis status ([place])   . Presence of permanent cardiac pacemaker     Assessment/Plan: 1 Day Post-Op Procedure(s) (LRB): INTRAMEDULLARY (IM) RETROGRADE FEMORAL NAILING (Left) Active Problems:   Femur fracture, left ([facility])   Pressure injury of skin   Closed left subtrochanteric femur fracture ([facility])  Estimated body mass index is 19.81 kg/m as calculated from the following:   Height as of this encounter: 5\' 4"  (1.626 m).   Weight as of this encounter: 52.3 kg (115 lb 6.4 oz). Advance diet Up with therapy D/C IV fluids Discharge to SNF when medically stable  Labs: reviewed DVT Prophylaxis - Lovenox, Foot Pumps and TED hose D/C O2 and Pulse OX and try on Room Air Needs to have a bowel movement today Follow up in [facility] in 2 weeks Cont TED stocking for 6 wks Cont. Lovenox 40 qd x 14 after discharge Ice to left knee  Lenae Wherley R[facility] [place] 12/16/2015, 10:47 AM

## 2015-12-16 NOTE — Progress Notes (Signed)
PT Cancellation Note  Patient Details [name] MRN: [id number] DOB: [birth year]   Cancelled Treatment:    Reason Eval/Treat Not Completed: Patient at procedure or test/unavailable (patient on perotenial dialysis)   Blythe Stanford, PT DPT 12/16/2015, 3:18 PM

## 2015-12-16 NOTE — Progress Notes (Signed)
Patient ID: Sharon [name], female   DOB: 1926/02/18, 82 y.o.   MRN: [id number]   [facility] [place] [name]    MR#:  [id number]  DATE OF BIRTH:  22-Oct-1926  SUBJECTIVE:  CHIEF COMPLAINT:   Chief Complaint  Patient presents with  . Fall   Femur surgery on 12/15/2015.  Complains of pain at surgical site. On 1 L oxygen. Eating well.  Had problems with peritoneal dialysis overnight and will have it during the day today.  REVIEW OF SYSTEMS:  ROS  Review of Systems  Constitutional: Negative.  Negative for chills, fever and malaise/fatigue.  HENT: Negative.  Negative for ear discharge, ear pain, hearing loss, nosebleeds and sore throat.   Eyes: Negative.  Negative for blurred vision and pain.  Respiratory: Negative.  Negative for cough, hemoptysis, shortness of breath and wheezing.   Cardiovascular: Negative.  Negative for chest pain, palpitations and leg swelling.  Gastrointestinal: Negative.  Negative for abdominal pain, blood in stool, diarrhea, nausea and vomiting.  Genitourinary: Negative.  Negative for dysuria.  Musculoskeletal: Positive for falls and joint pain. Negative for back pain.  Skin: Negative.   Neurological:. Negative for dizziness, tremors, speech change, focal weakness, seizures and headaches.  Endo/Heme/Allergies: Negative.  Does not bruise/bleed easily.  Psychiatric/Behavioral: Negative.  Negative for depression, hallucinations and suicidal ideas.  DRUG ALLERGIES:   Allergies  Allergen Reactions  . Effexor [Venlafaxine]   . Statins Other (See Comments)    Muscle weakness  . Codeine Other (See Comments)    GI UPSET   VITALS:  Blood pressure 120/65, pulse 69, temperature 98.2 F (36.8 C), temperature source Oral, resp. rate 18, height 5\' 4"  (1.626 m), weight 52.3 kg (115 lb 6.4 oz), SpO2 100 %. PHYSICAL EXAMINATION:  Physical Exam  Constitutional: She is oriented to person, place, and time and  well-developed, well-nourished, and in no distress. No distress.  HENT:  Head: Normocephalic.  Eyes: No scleral icterus.  Neck: Normal range of motion. Neck supple. No JVD present. No tracheal deviation present.  Cardiovascular: Normal rate and regular rhythm.  Exam reveals no gallop and no friction rub.   Murmur heard. Pulmonary/Chest: Effort normal and breath sounds normal. No respiratory distress. She has no wheezes. She has no rales. She exhibits no tenderness.  Abdominal: Soft. Bowel sounds are normal. She exhibits no distension and no mass. There is no tenderness. There is no rebound and no guarding.  Musculoskeletal: She exhibits no edema.  Left knee dressing Neurological: She is alert and oriented to person, place, and time.  Skin: Skin is warm. No rash noted. No erythema.  Psychiatric: Affect and judgment normal.   LABORATORY PANEL:   CBC  Recent Labs Lab 12/16/15 0352  WBC 9.9  HGB 8.7*  HCT 25.2*  PLT 152   ------------------------------------------------------------------------------------------------------------------ Chemistries   Recent Labs Lab 12/16/15 0352  NA 138  K 3.8  CL 105  CO2 25  GLUCOSE 112*  BUN 39*  CREATININE 2.27*  CALCIUM 8.2*  AST 36  ALT 16  ALKPHOS 55  BILITOT 0.7   RADIOLOGY:  Dg C-arm 61-120 Min  Result Date: 12/15/2015 CLINICAL DATA:  Internal fixation, left femoral nail. EXAM: DG C-ARM 61-120 MIN; LEFT FEMUR 2 VIEWS COMPARISON:  Plain films 12/14/2015 FINDINGS: Placement of intra medullary nail across the distal femoral fracture. Anatomic alignment. No visible hardware complicating feature. IMPRESSION: Internal fixation across the distal femoral fracture. No complicating feature. Electronically Signed  By: Rolm Baptise M.D.   On: 12/15/2015 13:26   Dg Femur Min 2 Views Left  Result Date: 12/15/2015 CLINICAL DATA:  Internal fixation, left femoral nail. EXAM: DG C-ARM 61-120 MIN; LEFT FEMUR 2 VIEWS COMPARISON:  Plain films  12/14/2015 FINDINGS: Placement of intra medullary nail across the distal femoral fracture. Anatomic alignment. No visible hardware complicating feature. IMPRESSION: Internal fixation across the distal femoral fracture. No complicating feature. Electronically Signed   By: Rolm Baptise M.D.   On: 12/15/2015 13:26   ASSESSMENT AND PLAN:   82 year old female with left knee prosthesis status post mechanical fall and now with distal femoral fracture extending to prosthesis.  1. Distal femoral fracture: Postop day 1 DVT prophylactic management as per orthopedics. Now on Lovenox Physical therapy to work with patient today Pain medications  * Hypokalemia - electrolyte managemener per renal.   * Acute blood loss anemia No need for transfusion at this time  2. Hypothyroid: Continue Synthroid  3. Chronic systolic heart failure, New York Heart Association class III, EF 20%: Continue beta blocker. Not on ACE inhibitor due to intolerance as per cardiology note  4. End-stage renal disease on peritoneal dialysis Nephrology on board  5. OSA: CPAP will be ordered if she wants to use it.  6. CAD followed by cardiology: Continue beta blocker. Patient has been intolerant to ACE inhibitor and not on diuretics due to chronic kidney disease. She is not on statin due to significant side effects as per cardiology note.  7. Pacemaker status  All the records are reviewed and case discussed with ED provider. Management plans discussed with the patient and she is in agreement  CODE STATUS: FULL  TOTAL TIME TAKING CARE OF THIS PATIENT: 30 minutes.   POSSIBLE D/C IN 1-2 DAYS, DEPENDING ON CLINICAL CONDITION.  [name] R M.D. on 12/16/2015 at 10:10 AM  Between 7am to 6pm - Pager - [phone]  After 6pm go to [email or web address] - Proofreader  [facility]  Office  [phone]  CC: Primary care physician; [name], MD  Note: This dictation was  prepared with Dragon dictation along with smaller phrase technology. Any transcriptional errors that result from this process are unintentional.

## 2015-12-16 NOTE — Progress Notes (Signed)
PD completed without issue. UF 38mL. Pt has no complaints. Catheter Capped and taped.

## 2015-12-17 ENCOUNTER — Encounter: Payer: Self-pay | Admitting: Orthopaedic Surgery

## 2015-12-17 LAB — BASIC METABOLIC PANEL
ANION GAP: 7 (ref 5–15)
BUN: 48 mg/dL — ABNORMAL HIGH (ref 6–20)
CALCIUM: 8.7 mg/dL — AB (ref 8.9–10.3)
CO2: 27 mmol/L (ref 22–32)
CREATININE: 2.56 mg/dL — AB (ref 0.44–1.00)
Chloride: 104 mmol/L (ref 101–111)
GFR, EST AFRICAN AMERICAN: 18 mL/min — AB (ref >60)
GFR, EST NON AFRICAN AMERICAN: 16 mL/min — AB (ref >60)
Glucose, Bld: 121 mg/dL — ABNORMAL HIGH (ref 65–99)
Potassium: 3.9 mmol/L (ref 3.5–5.1)
SODIUM: 138 mmol/L (ref 135–145)

## 2015-12-17 LAB — HEMOGLOBIN: HEMOGLOBIN: 8.5 g/dL — AB (ref 12.0–16.0)

## 2015-12-17 MED ORDER — SERTRALINE HCL 50 MG PO TABS
150.0000 mg | ORAL_TABLET | Freq: Every day | ORAL | Status: DC
  Administered 2015-12-17: 150 mg via ORAL
  Filled 2015-12-17: qty 1

## 2015-12-17 MED ORDER — TRAMADOL HCL 50 MG PO TABS: 50.0000 mg | ORAL_TABLET | Freq: Four times a day (QID) | ORAL | 0 refills | Status: DC | PRN

## 2015-12-17 MED ORDER — SENNOSIDES-DOCUSATE SODIUM 8.6-50 MG PO TABS: 1.0000 | ORAL_TABLET | Freq: Two times a day (BID) | ORAL | Status: DC

## 2015-12-17 MED ORDER — ENOXAPARIN SODIUM 30 MG/0.3ML ~~LOC~~ SOLN: 30.0000 mg | SUBCUTANEOUS | 0 refills | Status: DC

## 2015-12-17 NOTE — Progress Notes (Signed)
Patient is sitting up in the chair now. Clinical Education officer, museum (CSW) made charge RN aware that patient will have to remain sitting up in the chair for outpatient dialysis. Per charge RN they will monitor how long patient can sit up in the chair. Plan is for patient to D/C to [facility] when stable. CSW will continue to follow and assist as needed.   [employer], LCSW [phone]

## 2015-12-17 NOTE — Discharge Instructions (Signed)
Patient may be weight bearing as tolerated. May shower but do not soak. No baths. No ointments to be placed on wounds. Bandaids or simple gauze and tape after shower. Continue to work with physical therapy. May have staples removed in 10-14 days. (12/25/15). DVT prophylaxis Lovenox 30 mg subcutaneous daily x30 days. Follow up with Orthopaedic Surgeon in 10-14 days. If still in rehabilitation or SNF, may have facility remove staples and send portable XR to office.

## 2015-12-17 NOTE — Progress Notes (Addendum)
  Per MD Sudini pt having perm cath placed tomm. Pt can resume renal diet today. NPO after 0001. Order placed.

## 2015-12-17 NOTE — Progress Notes (Signed)
[facility]  ROUNDING NOTE   Subjective:   Sharon [name] was admitted on 12/14/2015 on Femur fracture, left ([place]) [S72.92XA]   Doing Fair.states she is a little confused this morning. No leg edema Appetite is relatively poor   Objective:  Vital signs in last 24 hours:  Temp:  [98.4 F (36.9 C)-98.7 F (37.1 C)] 98.4 F (36.9 C) (11/21 0838) Pulse Rate:  [62-70] 62 (11/21 1257) Resp:  [16-19] 18 (11/21 0838) BP: (110-140)/(39-72) 133/46 (11/21 1257) SpO2:  [92 %-99 %] 98 % (11/21 1056)  Weight change: 2.655 kg (5 lb 13.7 oz) Filed Weights   12/14/15 1136 12/15/15 1544 12/16/15 1118  Weight: 48.5 kg (107 lb) 52.3 kg (115 lb 6.4 oz) 55 kg (121 lb 4.1 oz)    Intake/Output: I/O last 3 completed shifts: In: 120 [P.O.:120] Out: 513 [Urine:200; Other:313]   Intake/Output this shift:  No intake/output data recorded.  Physical Exam: General: NAD, laying in bed  Head: Normocephalic, atraumatic. Moist oral mucosal membranes  Eyes: Anicteric,   Neck: Supple, trachea midline  Lungs:  Clear to auscultation  Heart: Regular rate and rhythm  Abdomen:  Soft, nontender,   Extremities: Left leg surgical wraps  Neurologic: Nonfocal, moving all four extremities  Skin: No lesions  Access: PD catheter in place    Basic Metabolic Panel:  Recent Labs Lab 12/14/15 1320 12/15/15 0445 12/16/15 0352 12/17/15 0544  NA 139 137 138 138  K 3.5 3.0* 3.8 3.9  CL 104 105 105 104  CO2 29 25 25 27   GLUCOSE 86 123* 112* 121*  BUN 45* 37* 39* 48*  CREATININE 2.13* 1.97* 2.27* 2.56*  CALCIUM 9.1 8.5* 8.2* 8.7*    Liver Function Tests:  Recent Labs Lab 12/14/15 1320 12/16/15 0352  AST 34 36  ALT 16 16  ALKPHOS 57 55  BILITOT 0.9 0.7  PROT 6.4* 5.3*  ALBUMIN 3.4* 2.7*   No results for input(s): LIPASE, AMYLASE in the last 168 hours. No results for input(s): AMMONIA in the last 168 hours.  CBC:  Recent Labs Lab 12/14/15 1320 12/15/15 0445 12/16/15 0352  12/17/15 0544  WBC 8.5 6.2 9.9  --   HGB 11.6* 9.5* 8.7* 8.5*  HCT 33.8* 27.5* 25.2*  --   MCV 90.1 88.3 88.8  --   PLT 216 174 152  --     Cardiac Enzymes: No results for input(s): CKTOTAL, CKMB, CKMBINDEX, TROPONINI in the last 168 hours.  BNP: Invalid input(s): POCBNP  CBG: No results for input(s): GLUCAP in the last 168 hours.  Microbiology: Results for orders placed or performed during the hospital encounter of 12/14/15  MRSA PCR Screening     Status: None   Collection Time: 12/14/15  4:43 PM  Result Value Ref Range Status   MRSA by PCR NEGATIVE NEGATIVE Final    Comment:        The GeneXpert MRSA Assay (FDA approved for NASAL specimens only), is one component of a comprehensive MRSA colonization surveillance program. It is not intended to diagnose MRSA infection nor to guide or monitor treatment for MRSA infections.     Coagulation Studies: No results for input(s): LABPROT, INR in the last 72 hours.  Urinalysis:  Recent Labs  12/14/15 1812  COLORURINE YELLOW*  LABSPEC 1.012  PHURINE 6.0  GLUCOSEU NEGATIVE  HGBUR NEGATIVE  BILIRUBINUR NEGATIVE  KETONESUR NEGATIVE  PROTEINUR 100*  NITRITE NEGATIVE  LEUKOCYTESUR NEGATIVE      Imaging: No results found.   Medications:    .  dialysis solution 1.5% low-MG/low-CA   Intraperitoneal Q24H  . feeding supplement (ENSURE ENLIVE)  237 mL Oral TID BM  . gentamicin cream  1 application Topical Daily  . heparin subcutaneous  5,000 Units Subcutaneous Q8H  . levothyroxine  25 mcg Oral QAC breakfast  . metoprolol succinate  25 mg Oral Daily  . ondansetron (ZOFRAN) IV  4 mg Intravenous Once  . senna-docusate  1 tablet Oral BID  . sertraline  150 mg Oral QHS   acetaminophen **OR** acetaminophen, bisacodyl, meclizine, methocarbamol, metoCLOPramide **OR** metoCLOPramide (REGLAN) injection, morphine injection, ondansetron **OR** ondansetron (ZOFRAN) IV, senna-docusate, traMADol  Assessment/ Plan:  Sharon [name] is a 82 y.o. white female with end stage renal disease on peritoneal dialysis, hypertension, anemia of chronic kidney disease, depression, secondary hyperparathyroidism, hyperlipidemia  PD [place].   1. End Stage Renal Disease:  Prescription of CCPD 8 hours 4 fills with 1.5% dextrose Patient is expected to be discharged to a rehabilitation facility. Peritoneal dialysis services are not available in rehabilitation facility, Therefore, she will need to be temporarily changed to hemodialysis until she can recover  - PermCath placement on Wednesday.  2. Hypertension: blood pressure at goal.  - metoprolol  3. Anemia of chronic kidney disease: hemoglobin dropped to 8.5 - epo as outpatient, will monitor.   4. Secondary Hyperparathyroidism: outpatient PTH, calcium and phosphorus at goal.  - calcitriol  - not on binders  5. Left femur fracture:  11/19- Open fixation of left periprosthetic femur fracture with retrograde femoral nail   LOS: 3 Brianni Manthe 11/21/20171:44 PM

## 2015-12-17 NOTE — Progress Notes (Signed)
PD dialysis connected

## 2015-12-17 NOTE — Progress Notes (Signed)
Clinical Social Worker (CSW) presented SNF bed offers to patient. Patient chose [name]. [name] liaison is aware of accepted offer. CSW will continue to follow and assist as needed.   [employer], LCSW [phone]

## 2015-12-17 NOTE — Progress Notes (Signed)
Physical Therapy Treatment Patient Details Name: KEYNA FALLEN MRN: [id number] DOB: 1926-07-16 Today's Date: 12/17/2015    History of Present Illness Patient with fall resulting in distal L hip fx on 12/14/15 at home resulting in ORIF. Patient with hx of ESRD and systolic CHF. Patient reports hx of frequent falling at home.    PT Comments    Pt agreeable to PT and notes urgent need to use bathroom. Pt was incontinent in the bed for voiding only. Pt having great difficulty using Left lower extremity often crossing it over Right lower extremity and attempting to stand this way. Increased cueing/instruction to correct. Pt has difficulty accepting weight through Left lower extremity, but is able to demonstrate ability several times with a productive right step clearing the floor; however, inconsistent resorting to pivoting right foot or poor right step with increased B knee and trunk flexion requiring increased assist. Pt very self distracting and limiting during mobility tasks and encouraged to focus on task at hand. Pt speaks with a lot of negative self talk, "I can't". Encouraged pt to use positive self talk, therapist acknowledging that tasks are challenging, but also noting that pt has demonstrated ability and to continue trying and building confidence. Pt requires use of bedside commode twice and seated rest on visitor chair with recliner ultimately being brought to pt to transfer in from bedside commode. Pt comfortable up in chair. Plan to see pt this afternoon for continued work on strength, endurance, balance and weightbearing on Left lower extremity for improved functional mobility.   Follow Up Recommendations  SNF     Equipment Recommendations  Rolling walker with 5" wheels    Recommendations for Other Services       Precautions / Restrictions Precautions Precautions: Fall Restrictions Weight Bearing Restrictions: Yes LLE Weight Bearing: Weight bearing as tolerated     Mobility  Bed Mobility Overal bed mobility: Needs Assistance Bed Mobility: Supine to Sit     Supine to sit: Mod assist     General bed mobility comments: Requires assist for LEs and trunk; axious and increased time/self limiting  Transfers Overall transfer level: Needs assistance Equipment used: Rolling walker (2 wheeled) Transfers: Sit to/from Stand Sit to Stand: Mod assist;Min assist         General transfer comment: Several transfers on/off BSC as well as from bed and to recliner. Cues for sequencing, hand placement and L foot placement. Cues for upright posture, use of LLE and knee extension. Very distracted and rapid fire thoughts/comments and less attention to task at hand.   Ambulation/Gait Ambulation/Gait assistance: Min assist Ambulation Distance (Feet): 10 Feet (3 ft x 3) Assistive device: Rolling walker (2 wheeled) Gait Pattern/deviations: Step-to pattern;Decreased stance time - left;Decreased step length - right;Decreased dorsiflexion - left;Decreased weight shift to left;Antalgic;Trunk flexed;Narrow base of support (often ctosses LEs) Gait velocity: slow Gait velocity interpretation: <1.8 ft/sec, indicative of risk for recurrent falls General Gait Details: Min A for balance/steadiness, as pt often crosses legs, poor weight shift to LLE and inconsistently attempts to just pivot RLE. Needs assistance to maneuver RW at times. Again less attention given to task at hand. Self distracting and limiting    Stairs            Wheelchair Mobility    Modified Rankin (Stroke Patients Only)       Balance   Sitting-balance support: Feet supported;Bilateral upper extremity supported Sitting balance-Leahy Scale: Fair     Standing balance support: Bilateral upper extremity supported Standing  balance-Leahy Scale: Poor                      Cognition Arousal/Alertness: Awake/alert Behavior During Therapy: WFL for tasks assessed/performed;Anxious Overall  Cognitive Status: Within Functional Limits for tasks assessed                      Exercises Other Exercises Other Exercises: On/off BSC 2x; requires assist for personal hygiene and static stand balance     General Comments        Pertinent Vitals/Pain Pain Assessment: Faces Faces Pain Scale: Hurts even more Pain Location: LLE Pain Intervention(s): Monitored during session;Repositioned;Premedicated before session    Home Living                      Prior Function            PT Goals (current goals can now be found in the care plan section) Progress towards PT goals: Progressing toward goals    Frequency    BID      PT Plan Current plan remains appropriate    Co-evaluation             End of Session Equipment Utilized During Treatment: Gait belt Activity Tolerance: Patient limited by fatigue;Patient limited by pain;Other (comment) (weakness and self limiting) Patient left: in chair;with call bell/phone within reach;with chair alarm set     Time: 1056 (also seen 11:33-11:52)-1107 PT Time Calculation (min) (ACUTE ONLY): 11 min  Charges:  $Gait Training: 8-22 mins $Therapeutic Activity: 8-22 mins                    G CodesLarae Grooms, PTA 12/17/2015, 12:23 PM

## 2015-12-17 NOTE — Progress Notes (Signed)
Physical Therapy Treatment Patient Details [name] MRN: [id number] DOB: Jul 15, 1926 Today's Date: 12/17/2015    History of Present Illness Patient with fall resulting in distal L hip fx on 12/14/15 at home resulting in ORIF. Patient with hx of ESRD and systolic CHF. Patient reports hx of frequent falling at home.    PT Comments    Pt continues up in chair with comfort; pt assisted to shift off left hip post exercise for comfort and pressure relief. Pt participates in long sit and seated exercise. Requires assist for Left lower extremity and consistent re direction to focus on task at hand. Continue PT to progress strength, range, endurance and balance for improved functional mobility.   Follow Up Recommendations  SNF     Equipment Recommendations  Rolling walker with 5" wheels    Recommendations for Other Services       Precautions / Restrictions Precautions Precautions: Fall Restrictions Weight Bearing Restrictions: Yes LLE Weight Bearing: Weight bearing as tolerated    Mobility  Bed Mobility Overal bed mobility: Needs Assistance Bed Mobility: Supine to Sit     Supine to sit: Mod assist     General bed mobility comments: Not tested; up in chair and wishes to stay in chair  Transfers Overall transfer level: Needs assistance Equipment used: Rolling walker (2 wheeled) Transfers: Sit to/from Stand Sit to Stand: Mod assist;Min assist         General transfer comment: Several transfers on/off BSC as well as from bed and to recliner. Cues for sequencing, hand placement and L foot placement. Cues for upright posture, use of LLE and knee extension. Very distracted and rapid fire thoughts/comments and less attention to task at hand.   Ambulation/Gait Ambulation/Gait assistance: Min assist Ambulation Distance (Feet): 10 Feet (3 ft x 3) Assistive device: Rolling walker (2 wheeled) Gait Pattern/deviations: Step-to pattern;Decreased stance time - left;Decreased  step length - right;Decreased dorsiflexion - left;Decreased weight shift to left;Antalgic;Trunk flexed;Narrow base of support (often ctosses LEs) Gait velocity: slow Gait velocity interpretation: <1.8 ft/sec, indicative of risk for recurrent falls General Gait Details: Min A for balance/steadiness, as pt often crosses legs, poor weight shift to LLE and inconsistently attempts to just pivot RLE. Needs assistance to maneuver RW at times. Again less attention given to task at hand. Self distracting and limiting    Stairs            Wheelchair Mobility    Modified Rankin (Stroke Patients Only)       Balance   Sitting-balance support: Feet supported;Bilateral upper extremity supported Sitting balance-Leahy Scale: Fair     Standing balance support: Bilateral upper extremity supported Standing balance-Leahy Scale: Poor                      Cognition Arousal/Alertness: Awake/alert Behavior During Therapy: WFL for tasks assessed/performed;Anxious Overall Cognitive Status: Within Functional Limits for tasks assessed                      Exercises General Exercises - Lower Extremity Ankle Circles/Pumps: AROM;Both;20 reps Quad Sets: Strengthening;Both;20 reps Gluteal Sets: Strengthening;Both;20 reps Short Arc QuadSinclair Ship;Left;20 reps (AROM R) Long Arc Quad: AAROM;Left;20 reps;Seated (AROM R) Heel Slides: AAROM;Left;20 reps (AROM R) Hip ABduction/ADduction: AAROM;Both;20 reps Hip Flexion/Marching: AAROM;Left;20 reps;Seated (AROM R) Toe Raises: AROM;Both;20 reps;Seated Heel Raises: AROM;Both;20 reps;Seated Other Exercises Other Exercises: On/off BSC 2x; requires assist for personal hygiene and static stand balance     General Comments  Pertinent Vitals/Pain Pain Assessment: Faces Pain Score: 8  (with movement and QS squeeze) Faces Pain Scale: Hurts even more Pain Location: LLE Pain Intervention(s): Monitored during session;Limited activity within  patient's tolerance    Home Living                      Prior Function            PT Goals (current goals can now be found in the care plan section) Progress towards PT goals: Progressing toward goals (slowly)    Frequency    BID      PT Plan Current plan remains appropriate    Co-evaluation             End of Session Equipment Utilized During Treatment: Gait belt Activity Tolerance: Patient tolerated treatment well;Patient limited by pain Patient left: in chair;with call bell/phone within reach;with chair alarm set     Time: 1336-1405 PT Time Calculation (min) (ACUTE ONLY): 29 min  Charges:  $Gait Training: 8-22 mins $Therapeutic Exercise: 23-37 mins $Therapeutic Activity: 8-22 mins                    G CodesLarae [name], PTA 12/17/2015, 2:08 PM

## 2015-12-17 NOTE — Progress Notes (Signed)
Patient ID: [name], female   DOB: 04-18-1926, 82 y.o.   MRN: [id number]   [place] [facility] [place] NAME: Wayne Stefanick    MR#:  [id number]  DATE OF BIRTH:  [birth year]  SUBJECTIVE:  CHIEF COMPLAINT:   Chief Complaint  Patient presents with  . Fall   Femur surgery on 12/15/2015.  Complains of pain at surgical site.   Waiting for PermCath placement  REVIEW OF SYSTEMS:  ROS  Review of Systems  Constitutional: Negative.  Negative for chills, fever and malaise/fatigue.  HENT: Negative.  Negative for ear discharge, ear pain, hearing loss, nosebleeds and sore throat.   Eyes: Negative.  Negative for blurred vision and pain.  Respiratory: Negative.  Negative for cough, hemoptysis, shortness of breath and wheezing.   Cardiovascular: Negative.  Negative for chest pain, palpitations and leg swelling.  Gastrointestinal: Negative.  Negative for abdominal pain, blood in stool, diarrhea, nausea and vomiting.  Genitourinary: Negative.  Negative for dysuria.  Musculoskeletal: Positive for falls and joint pain. Negative for back pain.  Skin: Negative.   Neurological:. Negative for dizziness, tremors, speech change, focal weakness, seizures and headaches.  Endo/Heme/Allergies: Negative.  Does not bruise/bleed easily.  Psychiatric/Behavioral: Negative.  Negative for depression, hallucinations and suicidal ideas.  DRUG ALLERGIES:   Allergies  Allergen Reactions  . Effexor [Venlafaxine]   . Statins Other (See Comments)    Muscle weakness  . Codeine Other (See Comments)    GI UPSET   VITALS:  Blood pressure 138/60, pulse 63, temperature 98.4 F (36.9 C), temperature source Oral, resp. rate 18, height 5\' 4"  (1.626 m), weight 55 kg (121 lb 4.1 oz), SpO2 99 %. PHYSICAL EXAMINATION:  Physical Exam  Constitutional: She is oriented to person, place, and time and well-developed, well-nourished, and in no distress. No distress.  HENT:  Head:  Normocephalic.  Eyes: No scleral icterus.  Neck: Normal range of motion. Neck supple. No JVD present. No tracheal deviation present.  Cardiovascular: Normal rate and regular rhythm.  Exam reveals no gallop and no friction rub.   Murmur heard. Pulmonary/Chest: Effort normal and breath sounds normal. No respiratory distress. She has no wheezes. She has no rales. She exhibits no tenderness.  Abdominal: Soft. Bowel sounds are normal. She exhibits no distension and no mass. There is no tenderness. There is no rebound and no guarding.  Musculoskeletal: She exhibits no edema.  Left knee dressing Neurological: She is alert and oriented to person, place, and time.  Skin: Skin is warm. No rash noted. No erythema.  Psychiatric: Affect and judgment normal.   LABORATORY PANEL:   CBC  Recent Labs Lab 12/16/15 0352 12/17/15 0544  WBC 9.9  --   HGB 8.7* 8.5*  HCT 25.2*  --   PLT 152  --    ------------------------------------------------------------------------------------------------------------------ Chemistries   Recent Labs Lab 12/16/15 0352 12/17/15 0544  NA 138 138  K 3.8 3.9  CL 105 104  CO2 25 27  GLUCOSE 112* 121*  BUN 39* 48*  CREATININE 2.27* 2.56*  CALCIUM 8.2* 8.7*  AST 36  --   ALT 16  --   ALKPHOS 55  --   BILITOT 0.7  --    RADIOLOGY:  No results found. ASSESSMENT AND PLAN:   82 year old female with left knee prosthesis status post mechanical fall and now with distal femoral fracture extending to prosthesis.  1. Distal femoral fracture: Postop day 2 DVT prophylactic management as per orthopedics. Now on  Heparin Physical therapy to work with patient today Pain medications  * Hypokalemia - electrolyte managemener per renal.   * Acute blood loss anemia No need for transfusion at this time  2. Hypothyroid: Continue Synthroid  3. Chronic systolic heart failure, New York Heart Association class III, EF 20%: Continue beta blocker. Not on ACE inhibitor due  to intolerance as per cardiology note  4. End-stage renal disease on peritoneal dialysis Nephrology on board Patient is waiting for PermCath placement to get hemodialysis while at skilled nursing facility.  5. OSA: CPAP will be ordered if she wants to use it.  6. CAD followed by cardiology: Continue beta blocker. Patient has been intolerant to ACE inhibitor and not on diuretics due to chronic kidney disease. She is not on statin due to significant side effects as per cardiology note.  7. Pacemaker status  All the records are reviewed and case discussed with ED provider. Management plans discussed with the patient and she is in agreement  CODE STATUS: FULL  TOTAL TIME TAKING CARE OF THIS PATIENT: 30 minutes.   POSSIBLE D/C IN 1-2 DAYS, DEPENDING ON CLINICAL CONDITION.  [name] R M.D. on 12/17/2015 at 10:13 AM  Between 7am to 6pm - Pager - [phone]  After 6pm go to [email or web address] - Proofreader  [facility]  Office  [phone]  CC: Primary care physician; [name], MD  Note: This dictation was prepared with Dragon dictation along with smaller phrase technology. Any transcriptional errors that result from this process are unintentional.

## 2015-12-18 ENCOUNTER — Encounter
Admission: EM | Disposition: A | Discharge status: Skilled Nursing Facility | Payer: Self-pay | Source: Home / Self Care | Attending: Internal Medicine

## 2015-12-18 DIAGNOSIS — Z7901 Long term (current) use of anticoagulants: Secondary | ICD-10-CM | POA: Diagnosis not present

## 2015-12-18 DIAGNOSIS — N185 Chronic kidney disease, stage 5: Secondary | ICD-10-CM | POA: Diagnosis not present

## 2015-12-18 DIAGNOSIS — N186 End stage renal disease: Secondary | ICD-10-CM | POA: Diagnosis not present

## 2015-12-18 DIAGNOSIS — S299XXA Unspecified injury of thorax, initial encounter: Secondary | ICD-10-CM | POA: Diagnosis not present

## 2015-12-18 DIAGNOSIS — R262 Difficulty in walking, not elsewhere classified: Secondary | ICD-10-CM | POA: Diagnosis not present

## 2015-12-18 DIAGNOSIS — W19XXXA Unspecified fall, initial encounter: Secondary | ICD-10-CM | POA: Diagnosis not present

## 2015-12-18 DIAGNOSIS — Z950 Presence of cardiac pacemaker: Secondary | ICD-10-CM | POA: Diagnosis not present

## 2015-12-18 DIAGNOSIS — Y999 Unspecified external cause status: Secondary | ICD-10-CM | POA: Diagnosis not present

## 2015-12-18 DIAGNOSIS — Z5189 Encounter for other specified aftercare: Secondary | ICD-10-CM | POA: Diagnosis not present

## 2015-12-18 DIAGNOSIS — I429 Cardiomyopathy, unspecified: Secondary | ICD-10-CM | POA: Diagnosis not present

## 2015-12-18 DIAGNOSIS — Z743 Need for continuous supervision: Secondary | ICD-10-CM | POA: Diagnosis not present

## 2015-12-18 DIAGNOSIS — S82009A Unspecified fracture of unspecified patella, initial encounter for closed fracture: Secondary | ICD-10-CM | POA: Diagnosis not present

## 2015-12-18 DIAGNOSIS — Z79899 Other long term (current) drug therapy: Secondary | ICD-10-CM | POA: Diagnosis not present

## 2015-12-18 DIAGNOSIS — R0602 Shortness of breath: Secondary | ICD-10-CM | POA: Diagnosis not present

## 2015-12-18 DIAGNOSIS — F419 Anxiety disorder, unspecified: Secondary | ICD-10-CM | POA: Diagnosis not present

## 2015-12-18 DIAGNOSIS — S7292XD Unspecified fracture of left femur, subsequent encounter for closed fracture with routine healing: Secondary | ICD-10-CM | POA: Diagnosis not present

## 2015-12-18 DIAGNOSIS — N2581 Secondary hyperparathyroidism of renal origin: Secondary | ICD-10-CM | POA: Diagnosis not present

## 2015-12-18 DIAGNOSIS — D62 Acute posthemorrhagic anemia: Secondary | ICD-10-CM | POA: Diagnosis not present

## 2015-12-18 DIAGNOSIS — I2510 Atherosclerotic heart disease of native coronary artery without angina pectoris: Secondary | ICD-10-CM | POA: Diagnosis not present

## 2015-12-18 DIAGNOSIS — D631 Anemia in chronic kidney disease: Secondary | ICD-10-CM | POA: Diagnosis not present

## 2015-12-18 DIAGNOSIS — Y929 Unspecified place or not applicable: Secondary | ICD-10-CM | POA: Diagnosis not present

## 2015-12-18 DIAGNOSIS — D649 Anemia, unspecified: Secondary | ICD-10-CM | POA: Diagnosis not present

## 2015-12-18 DIAGNOSIS — M25559 Pain in unspecified hip: Secondary | ICD-10-CM | POA: Diagnosis not present

## 2015-12-18 DIAGNOSIS — D519 Vitamin B12 deficiency anemia, unspecified: Secondary | ICD-10-CM | POA: Diagnosis not present

## 2015-12-18 DIAGNOSIS — Z7401 Bed confinement status: Secondary | ICD-10-CM | POA: Diagnosis not present

## 2015-12-18 DIAGNOSIS — F329 Major depressive disorder, single episode, unspecified: Secondary | ICD-10-CM | POA: Diagnosis not present

## 2015-12-18 DIAGNOSIS — R6889 Other general symptoms and signs: Secondary | ICD-10-CM | POA: Diagnosis not present

## 2015-12-18 DIAGNOSIS — M25562 Pain in left knee: Secondary | ICD-10-CM | POA: Diagnosis not present

## 2015-12-18 DIAGNOSIS — R41841 Cognitive communication deficit: Secondary | ICD-10-CM | POA: Diagnosis not present

## 2015-12-18 DIAGNOSIS — Z992 Dependence on renal dialysis: Secondary | ICD-10-CM | POA: Diagnosis not present

## 2015-12-18 DIAGNOSIS — L8990 Pressure ulcer of unspecified site, unspecified stage: Secondary | ICD-10-CM | POA: Diagnosis not present

## 2015-12-18 DIAGNOSIS — S7292XA Unspecified fracture of left femur, initial encounter for closed fracture: Secondary | ICD-10-CM | POA: Diagnosis not present

## 2015-12-18 DIAGNOSIS — I509 Heart failure, unspecified: Secondary | ICD-10-CM | POA: Diagnosis not present

## 2015-12-18 DIAGNOSIS — Z23 Encounter for immunization: Secondary | ICD-10-CM | POA: Diagnosis not present

## 2015-12-18 DIAGNOSIS — S7222XA Displaced subtrochanteric fracture of left femur, initial encounter for closed fracture: Secondary | ICD-10-CM | POA: Diagnosis not present

## 2015-12-18 DIAGNOSIS — R1312 Dysphagia, oropharyngeal phase: Secondary | ICD-10-CM | POA: Diagnosis not present

## 2015-12-18 DIAGNOSIS — Y939 Activity, unspecified: Secondary | ICD-10-CM | POA: Diagnosis not present

## 2015-12-18 DIAGNOSIS — J159 Unspecified bacterial pneumonia: Secondary | ICD-10-CM | POA: Diagnosis not present

## 2015-12-18 DIAGNOSIS — R918 Other nonspecific abnormal finding of lung field: Secondary | ICD-10-CM | POA: Diagnosis not present

## 2015-12-18 DIAGNOSIS — S7290XA Unspecified fracture of unspecified femur, initial encounter for closed fracture: Secondary | ICD-10-CM | POA: Diagnosis not present

## 2015-12-18 DIAGNOSIS — D509 Iron deficiency anemia, unspecified: Secondary | ICD-10-CM | POA: Diagnosis not present

## 2015-12-18 DIAGNOSIS — I132 Hypertensive heart and chronic kidney disease with heart failure and with stage 5 chronic kidney disease, or end stage renal disease: Secondary | ICD-10-CM | POA: Diagnosis not present

## 2015-12-18 DIAGNOSIS — E039 Hypothyroidism, unspecified: Secondary | ICD-10-CM | POA: Diagnosis not present

## 2015-12-18 DIAGNOSIS — M9712XD Periprosthetic fracture around internal prosthetic left knee joint, subsequent encounter: Secondary | ICD-10-CM | POA: Diagnosis not present

## 2015-12-18 DIAGNOSIS — I5020 Unspecified systolic (congestive) heart failure: Secondary | ICD-10-CM | POA: Diagnosis not present

## 2015-12-18 DIAGNOSIS — I5022 Chronic systolic (congestive) heart failure: Secondary | ICD-10-CM | POA: Diagnosis not present

## 2015-12-18 DIAGNOSIS — M542 Cervicalgia: Secondary | ICD-10-CM | POA: Diagnosis not present

## 2015-12-18 DIAGNOSIS — G4733 Obstructive sleep apnea (adult) (pediatric): Secondary | ICD-10-CM | POA: Diagnosis not present

## 2015-12-18 DIAGNOSIS — W06XXXA Fall from bed, initial encounter: Secondary | ICD-10-CM | POA: Diagnosis not present

## 2015-12-18 DIAGNOSIS — M9712XA Periprosthetic fracture around internal prosthetic left knee joint, initial encounter: Secondary | ICD-10-CM | POA: Diagnosis not present

## 2015-12-18 DIAGNOSIS — M6281 Muscle weakness (generalized): Secondary | ICD-10-CM | POA: Diagnosis not present

## 2015-12-18 DIAGNOSIS — I10 Essential (primary) hypertension: Secondary | ICD-10-CM | POA: Diagnosis not present

## 2015-12-18 DIAGNOSIS — E038 Other specified hypothyroidism: Secondary | ICD-10-CM | POA: Diagnosis not present

## 2015-12-18 DIAGNOSIS — S199XXA Unspecified injury of neck, initial encounter: Secondary | ICD-10-CM | POA: Diagnosis not present

## 2015-12-18 DIAGNOSIS — S0990XA Unspecified injury of head, initial encounter: Secondary | ICD-10-CM | POA: Diagnosis not present

## 2015-12-18 HISTORY — PX: PERIPHERAL VASCULAR CATHETERIZATION: SHX172C

## 2015-12-18 LAB — HEPATITIS B SURFACE ANTIGEN: Hepatitis B Surface Ag: NEGATIVE

## 2015-12-18 LAB — BASIC METABOLIC PANEL
Anion gap: 6 (ref 5–15)
BUN: 43 mg/dL — AB (ref 6–20)
CALCIUM: 8.7 mg/dL — AB (ref 8.9–10.3)
CO2: 29 mmol/L (ref 22–32)
CREATININE: 2.08 mg/dL — AB (ref 0.44–1.00)
Chloride: 101 mmol/L (ref 101–111)
GFR calc Af Amer: 24 mL/min — ABNORMAL LOW (ref >60)
GFR, EST NON AFRICAN AMERICAN: 20 mL/min — AB (ref >60)
Glucose, Bld: 129 mg/dL — ABNORMAL HIGH (ref 65–99)
POTASSIUM: 4.1 mmol/L (ref 3.5–5.1)
SODIUM: 136 mmol/L (ref 135–145)

## 2015-12-18 LAB — GLUCOSE, CAPILLARY: Glucose-Capillary: 98 mg/dL (ref 65–99)

## 2015-12-18 LAB — HEPATITIS B SURFACE ANTIBODY, QUANTITATIVE: Hepatitis B-Post: 8.5 m[IU]/mL — ABNORMAL LOW (ref >9.9)

## 2015-12-18 LAB — HEMOGLOBIN: HEMOGLOBIN: 8.8 g/dL — AB (ref 12.0–16.0)

## 2015-12-18 LAB — HEPATITIS B CORE ANTIBODY, TOTAL: HEP B C TOTAL AB: POSITIVE — AB

## 2015-12-18 SURGERY — DIALYSIS/PERMA CATHETER INSERTION
Anesthesia: Moderate Sedation

## 2015-12-18 MED ORDER — HEPARIN SODIUM (PORCINE) 10000 UNIT/ML IJ SOLN
INTRAMUSCULAR | Status: AC
  Filled 2015-12-18: qty 1

## 2015-12-18 MED ORDER — MIDAZOLAM HCL 5 MG/5ML IJ SOLN
INTRAMUSCULAR | Status: AC
  Filled 2015-12-18: qty 5

## 2015-12-18 MED ORDER — LIDOCAINE-EPINEPHRINE 1 %-1:100000 IJ SOLN
INTRAMUSCULAR | Status: AC
  Filled 2015-12-18: qty 1

## 2015-12-18 MED ORDER — HEPARIN (PORCINE) IN NACL 2-0.9 UNIT/ML-% IJ SOLN
INTRAMUSCULAR | Status: AC
  Filled 2015-12-18: qty 500

## 2015-12-18 MED ORDER — FENTANYL CITRATE (PF) 100 MCG/2ML IJ SOLN
INTRAMUSCULAR | Status: DC | PRN
  Administered 2015-12-18: 25 ug via INTRAVENOUS
  Administered 2015-12-18: 50 ug via INTRAVENOUS

## 2015-12-18 MED ORDER — EPOETIN ALFA 4000 UNIT/ML IJ SOLN
4000.0000 [IU] | Freq: Once | INTRAMUSCULAR | Status: DC
  Filled 2015-12-18: qty 1

## 2015-12-18 MED ORDER — DEXTROSE 5 % IV SOLN
INTRAVENOUS | Status: AC
  Filled 2015-12-18 (×10): qty 1.5

## 2015-12-18 MED ORDER — MIDAZOLAM HCL 2 MG/2ML IJ SOLN
INTRAMUSCULAR | Status: DC | PRN
  Administered 2015-12-18: 2 mg via INTRAVENOUS

## 2015-12-18 MED ORDER — FENTANYL CITRATE (PF) 100 MCG/2ML IJ SOLN
INTRAMUSCULAR | Status: AC
  Filled 2015-12-18: qty 4

## 2015-12-18 SURGICAL SUPPLY — 5 items
CATH PALINDROME RT-P 15FX19CM (CATHETERS) ×3 IMPLANT
DRAPE INCISE IOBAN 66X45 STRL (DRAPES) ×3 IMPLANT
NEEDLE ENTRY 21GA 7CM ECHOTIP (NEEDLE) ×3 IMPLANT
PACK ANGIOGRAPHY (CUSTOM PROCEDURE TRAY) ×3 IMPLANT
TOWEL OR 17X26 4PK STRL BLUE (TOWEL DISPOSABLE) ×3 IMPLANT

## 2015-12-18 NOTE — Discharge Planning (Signed)
Patient IV removed.  Patient has had perma cath placed, had hemodialysis and is now ready to [facility].  Packet prepared, since [place] inside and Discharge papers.  Report called to [facility] and s/w [place], Therapist, sports.  EMS called to transport but is 6th in line. Son also contacted to inform of discharge.  RN assessment and VS revealed stability for DC

## 2015-12-18 NOTE — Progress Notes (Signed)
[facility]  ROUNDING NOTE   Subjective:   Sharon [name] was admitted on 12/14/2015 on Femur fracture, left ([place]) [S72.92XA]   Patient seen during dialysis Tolerating well    HEMODIALYSIS FLOWSHEET:  Blood Flow Rate (mL/min): 300 mL/min Arterial Pressure (mmHg): -90 mmHg Venous Pressure (mmHg): 100 mmHg Transmembrane Pressure (mmHg): 30 mmHg Ultrafiltration Rate (mL/min): 330 mL/min Dialysate Flow Rate (mL/min): 600 ml/min Conductivity: Machine : 14.8 Conductivity: Machine : 14.8 Dialysis Fluid Bolus: Normal Saline Bolus Amount (mL): 250 mL Dialysate Change: Other (comment) (3K) Intra-Hemodialysis Comments: 106. Resting     Objective:  Vital signs in last 24 hours:  Temp:  [97.5 F (36.4 C)-98.8 F (37.1 C)] 97.6 F (36.4 C) (11/22 1341) Pulse Rate:  [61-82] 61 (11/22 1343) Resp:  [13-20] 20 (11/22 1343) BP: (133-151)/(53-78) 151/63 (11/22 1343) SpO2:  [94 %-100 %] 99 % (11/22 1343) Weight:  [53.1 kg (117 lb 1 oz)-55.8 kg (123 lb)] 53.1 kg (117 lb 1 oz) (11/22 1341)  Weight change: -0.115 kg (-4.1 oz) Filed Weights   12/17/15 2035 12/18/15 0500 12/18/15 1341  Weight: 54.9 kg (121 lb) 55.8 kg (123 lb) 53.1 kg (117 lb 1 oz)    Intake/Output: I/O last 3 completed shifts: In: 240 [P.O.:240] Out: 313 [Other:313]   Intake/Output this shift:  Total I/O In: -  Out: 24 [Other:24]  Physical Exam: General: NAD, laying in bed  Head: Normocephalic, atraumatic. Moist oral mucosal membranes  Eyes: Anicteric,   Neck: Supple, trachea midline  Lungs:  Clear to auscultation  Heart: Regular rate and rhythm  Abdomen:  Soft, nontender,   Extremities: Left leg surgical wraps  Neurologic: Nonfocal, moving all four extremities  Skin: No lesions  Access: PD catheter in place    Basic Metabolic Panel:  Recent Labs Lab 12/14/15 1320 12/15/15 0445 12/16/15 0352 12/17/15 0544 12/18/15 0521  NA 139 137 138 138 136  K 3.5 3.0* 3.8 3.9 4.1  CL 104 105  105 104 101  CO2 29 25 25 27 29   GLUCOSE 86 123* 112* 121* 129*  BUN 45* 37* 39* 48* 43*  CREATININE 2.13* 1.97* 2.27* 2.56* 2.08*  CALCIUM 9.1 8.5* 8.2* 8.7* 8.7*    Liver Function Tests:  Recent Labs Lab 12/14/15 1320 12/16/15 0352  AST 34 36  ALT 16 16  ALKPHOS 57 55  BILITOT 0.9 0.7  PROT 6.4* 5.3*  ALBUMIN 3.4* 2.7*   No results for input(s): LIPASE, AMYLASE in the last 168 hours. No results for input(s): AMMONIA in the last 168 hours.  CBC:  Recent Labs Lab 12/14/15 1320 12/15/15 0445 12/16/15 0352 12/17/15 0544 12/18/15 0521  WBC 8.5 6.2 9.9  --   --   HGB 11.6* 9.5* 8.7* 8.5* 8.8*  HCT 33.8* 27.5* 25.2*  --   --   MCV 90.1 88.3 88.8  --   --   PLT 216 174 152  --   --     Cardiac Enzymes: No results for input(s): CKTOTAL, CKMB, CKMBINDEX, TROPONINI in the last 168 hours.  BNP: Invalid input(s): POCBNP  CBG:  Recent Labs Lab 12/18/15 [id number]    Microbiology: Results for orders placed or performed during the hospital encounter of 12/14/15  MRSA PCR Screening     Status: None   Collection Time: 12/14/15  4:43 PM  Result Value Ref Range Status   MRSA by PCR NEGATIVE NEGATIVE Final    Comment:        The GeneXpert MRSA  Assay (FDA approved for NASAL specimens only), is one component of a comprehensive MRSA colonization surveillance program. It is not intended to diagnose MRSA infection nor to guide or monitor treatment for MRSA infections.     Coagulation Studies: No results for input(s): LABPROT, INR in the last 72 hours.  Urinalysis: No results for input(s): COLORURINE, LABSPEC, PHURINE, GLUCOSEU, HGBUR, BILIRUBINUR, KETONESUR, PROTEINUR, UROBILINOGEN, NITRITE, LEUKOCYTESUR in the last 72 hours.  Invalid input(s): APPERANCEUR    Imaging: No results found.   Medications:    . dialysis solution 1.5% low-MG/low-CA   Intraperitoneal Q24H  . feeding supplement (ENSURE ENLIVE)  237 mL Oral TID BM  . fentaNYL      .  gentamicin cream  1 application Topical Daily  . heparin subcutaneous  5,000 Units Subcutaneous Q8H  . levothyroxine  25 mcg Oral QAC breakfast  . metoprolol succinate  25 mg Oral Daily  . ondansetron (ZOFRAN) IV  4 mg Intravenous Once  . senna-docusate  1 tablet Oral BID  . sertraline  150 mg Oral QHS   acetaminophen **OR** acetaminophen, bisacodyl, meclizine, methocarbamol, metoCLOPramide **OR** metoCLOPramide (REGLAN) injection, morphine injection, ondansetron **OR** ondansetron (ZOFRAN) IV, senna-docusate, traMADol  Assessment/ Plan:  Sharon [name] is a 82 y.o. white female with end stage renal disease on peritoneal dialysis, hypertension, anemia of chronic kidney disease, depression, secondary hyperparathyroidism, hyperlipidemia  PD [place].   1. End Stage Renal Disease:   Patient is expected to be discharged to a rehabilitation facility. Peritoneal dialysis services are not available in rehabilitation facility, Therefore, she will need to be temporarily changed to hemodialysis until she can recover  Patient seen during dialysis Tolerating well  Tentatively given chair time of 12 pm on Friday and TTS-2 shift starting next week at [facility] Orders sent to the dialysis unit  2.  Anemia of chronic kidney disease: hemoglobin dropped to 8.8 - epo  With dialysis  4. Secondary Hyperparathyroidism: outpatient PTH, calcium and phosphorus at goal.  - calcitriol  - not on binders  5. Left femur fracture:  11/19- Open fixation of left periprosthetic femur fracture with retrograde femoral nail   LOS: 4 Sharon [name] 11/22/20171:57 PM

## 2015-12-18 NOTE — Progress Notes (Signed)
Patient ID: Sharon [name], female   DOB: [birth year], 82 y.o.   MRN: [id number]   [place] at [place] [name]    MR#:  [id number]  DATE OF BIRTH:  1926-04-10  SUBJECTIVE:  CHIEF COMPLAINT:   Chief Complaint  Patient presents with  . Fall   Femur surgery on 12/15/2015.  Complains of pain at surgical site.   Waiting for PermCath placement  Plan is to discharge to SNF after perm cath placement  REVIEW OF SYSTEMS:  ROS  Review of Systems  Constitutional: Negative.  Negative for chills, fever and malaise/fatigue.  HENT: Negative.  Negative for ear discharge, ear pain, hearing loss, nosebleeds and sore throat.   Eyes: Negative.  Negative for blurred vision and pain.  Respiratory: Negative.  Negative for cough, hemoptysis, shortness of breath and wheezing.   Cardiovascular: Negative.  Negative for chest pain, palpitations and leg swelling.  Gastrointestinal: Negative.  Negative for abdominal pain, blood in stool, diarrhea, nausea and vomiting.  Genitourinary: Negative.  Negative for dysuria.  Musculoskeletal: Positive for falls and joint pain. Negative for back pain.  Skin: Negative.   Neurological:. Negative for dizziness, tremors, speech change, focal weakness, seizures and headaches.  Endo/Heme/Allergies: Negative.  Does not bruise/bleed easily.  Psychiatric/Behavioral: Negative.  Negative for depression, hallucinations and suicidal ideas.  DRUG ALLERGIES:   Allergies  Allergen Reactions  . Effexor [Venlafaxine]   . Statins Other (See Comments)    Muscle weakness  . Codeine Other (See Comments)    GI UPSET   VITALS:  Blood pressure (!) 133/57, pulse 66, temperature 98.8 F (37.1 C), temperature source Oral, resp. rate 18, height 5\' 4"  (1.626 m), weight 55.8 kg (123 lb), SpO2 96 %. PHYSICAL EXAMINATION:  Physical Exam  Constitutional: She is oriented to person, place, and time and well-developed, well-nourished,  and in no distress. No distress.  HENT:  Head: Normocephalic.  Eyes: No scleral icterus.  Neck: Normal range of motion. Neck supple. No JVD present. No tracheal deviation present.  Cardiovascular: Normal rate and regular rhythm.  Exam reveals no gallop and no friction rub.   Murmur heard. Pulmonary/Chest: Effort normal and breath sounds normal. No respiratory distress. She has no wheezes. She has no rales. She exhibits no tenderness.  Abdominal: Soft. Bowel sounds are normal. She exhibits no distension and no mass. There is no tenderness. There is no rebound and no guarding.  Musculoskeletal: She exhibits no edema.  Left knee dressing Neurological: She is alert and oriented to person, place, and time.  Skin: Skin is warm. No rash noted. No erythema.  Psychiatric: Affect and judgment normal.   LABORATORY PANEL:   CBC  Recent Labs Lab 12/16/15 0352  12/18/15 0521  WBC 9.9  --   --   HGB 8.7*  < > 8.8*  HCT 25.2*  --   --   PLT 152  --   --   < > = values in this interval not displayed. ------------------------------------------------------------------------------------------------------------------ Chemistries   Recent Labs Lab 12/16/15 0352  12/18/15 0521  NA 138  < > 136  K 3.8  < > 4.1  CL 105  < > 101  CO2 25  < > 29  GLUCOSE 112*  < > 129*  BUN 39*  < > 43*  CREATININE 2.27*  < > 2.08*  CALCIUM 8.2*  < > 8.7*  AST 36  --   --   ALT 16  --   --  ALKPHOS 55  --   --   BILITOT 0.7  --   --   < > = values in this interval not displayed. RADIOLOGY:  No results found. ASSESSMENT AND PLAN:   82 year old female with left knee prosthesis status post mechanical fall and now with distal femoral fracture extending to prosthesis.  1. Distal femoral fracture: Postop day 3 DVT prophylactic management as per orthopedics. Physical therapy to work with patient today Pain medications  * Hypokalemia - electrolyte managemener per renal.   * Acute blood loss anemia -  stable No need for transfusion at this time  2. Hypothyroid: Continue Synthroid  3. Chronic systolic heart failure, New York Heart Association class III, EF 20%: Continue beta blocker. Not on ACE inhibitor due to intolerance as per cardiology note  4. End-stage renal disease on peritoneal dialysis Nephrology on board Patient is waiting for PermCath placement to get hemodialysis while at skilled nursing facility.  5. OSA: CPAP will be ordered if she wants to use it.  6. CAD followed by cardiology: Continue beta blocker. Patient has been intolerant to ACE inhibitor and not on diuretics due to chronic kidney disease. She is not on statin due to significant side effects as per cardiology note.  7. Pacemaker status  CODE STATUS: FULL  TOTAL TIME TAKING CARE OF THIS PATIENT: 30 minutes.   POSSIBLE D/C IN 1-2 DAYS, DEPENDING ON CLINICAL CONDITION.  Sharon [name] R M.D. on 12/18/2015 at 9:49 AM  Between 7am to 6pm - Pager - [phone]  After 6pm go to [email or web address] - Proofreader  [facility]  Office  [phone]  CC: Primary care physician; Sharon Rumps, MD  Note: This dictation was prepared with Dragon dictation along with smaller phrase technology. Any transcriptional errors that result from this process are unintentional.

## 2015-12-18 NOTE — Progress Notes (Signed)
Pre Dialysis 

## 2015-12-18 NOTE — Op Note (Signed)
[facility]   OPERATIVE NOTE     PROCEDURE: 1. Insertion right IJ tunneled dialysis catheter placement 2. Catheter placement and cannulation under ultrasound and fluoroscopic guidance  PRE-OPERATIVE DIAGNOSIS: end-stage renal requiring hemodialysis  POST-OPERATIVE DIAGNOSIS: same as above  SURGEON: Katha Cabal, M.D.  ANESTHESIA: Conscious sedation was administered under my direct supervision. IV Versed plus fentanyl were utilized. Continuous ECG, pulse oximetry and blood pressure was monitored throughout the entire procedure. Conscious sedation was for a total of 40 minutes.  ESTIMATED BLOOD LOSS: Minimal  FINDING(S): 1.  Tips of the catheter in the right atrium on fluoroscopy 2.  No obvious pneumothorax on fluoroscopy  SPECIMEN(S):  none  INDICATIONS:   Sharon [name] is a 82 y.o. female  presents with end stage renal disease.  Therefore, the patient requires a tunneled dialysis catheter placement.  The patient is informed of  the risks catheter placement include but are not limited to: bleeding, infection, central venous injury, pneumothorax, possible venous stenosis, possible malpositioning in the venous system, and possible infections related to long-term catheter presence.  The patient was aware of these risks and agreed to proceed.  DESCRIPTION: The patient was taken back to Special Procedure suite.  Prior to sedation, the patient was given IV antibiotics.  After obtaining adequate sedation, the patient was prepped and draped in the standard fashion for a chest or neck tunneled dialysis catheter placement.  Appropriate Time Out is called.   The the neck and chest wall are then infiltrated with 1% Lidocaine with epinepherine.  A  19 cm tip to cuff Palindrome catheter is then selected, opened on the back table and prepped. Under ultrasound guidance, the right internal jugular vein was cannulated with the Seldinger needle.  A J-wire was then advanced  under fluoroscopic guidance into the inferior vena cava and the wire was secured.  Small counter incision was then made at the wire insertion site. A small pocket was fashioned with blunt dissection to allow easier passage of the cuff.  The dilator and peel-away sheath are then advanced over the wire under fluoroscopic guidance. The catheters and advanced through the peel-away sheath after removal of the wire. It is approximated to the chest wall after verifying the tips at the atrial caval junction and an exit site is selected.  Small incision is made at the selected exit site and the tunneling device was passed subcutaneously to the neck counter incision. Catheter is then pulled through the subcutaneous tunnel. The catheter is then verified for tip position under fluoroscopy, transected and the hub assembly connected.    Each port was tested by aspirating and flushing.  No resistance was noted.  Each port was then thoroughly flushed with heparinized saline.  The catheter was secured in placed with two interrupted stitches of 0 silk tied to the catheter.  The neck incision was closed with a U-stitch of 4-0 Monocryl.  The neck and chest incision were cleaned and sterile bandages applied including a Biopatch.  Each port was then packed with concentrated heparin (1000 Units/mL) at the manufacturer recommended volumes to each port.  Sterile caps were applied to each port.  On completion fluoroscopy, the tips of the catheter were in the right atrium, and there was no evidence of pneumothorax.  COMPLICATIONS: None  CONDITION: Good   Katha Cabal M.D. [facility] Office: [phone]   12/18/2015, 12:14 PM

## 2015-12-18 NOTE — Progress Notes (Signed)
Dialysis started 

## 2015-12-18 NOTE — Clinical Social Work Placement (Signed)
   CLINICAL SOCIAL WORK PLACEMENT  NOTE  Date:  12/18/2015  Patient Details  [name] MRN: [id number] Date of Birth: 1926-03-31  Clinical Social Work is seeking post-discharge placement for this patient at the [place] level of care (*CSW will initial, date and re-position this form in  chart as items are completed):  Yes   Patient/family provided with [place] Work Department's list of facilities offering this level of care within the geographic area requested by the patient (or if unable, by the patient's family).  Yes   Patient/family informed of their freedom to choose among providers that offer the needed level of care, that participate in Medicare, Medicaid or managed care program needed by the patient, have an available bed and are willing to accept the patient.  Yes   Patient/family informed of [facility]'s ownership interest in [facility] and [facility], as well as of the fact that they are under no obligation to receive care at these facilities.  PASRR submitted to EDS on 12/16/15     PASRR number received on 12/16/15     Existing PASRR number confirmed on       FL2 transmitted to all facilities in geographic area requested by pt/family on 12/16/15     FL2 transmitted to all facilities within larger geographic area on       Patient informed that his/her managed care company has contracts with or will negotiate with certain facilities, including the following:        Yes   Patient/family informed of bed offers received.  Patient chooses bed at  ([facility] )     Physician recommends and patient chooses bed at      Patient to be transferred to  ([facility] ) on 12/18/15.  Patient to be transferred to facility by  [facility] )     Patient family notified on 12/18/15 of transfer.  Name of family member notified:   (Patient's niece Olivia Mackie is aware of D/C today. )     PHYSICIAN       Additional Comment:     _______________________________________________ Yordy Matton, Veronia Beets, LCSW 12/18/2015, 2:18 PM

## 2015-12-18 NOTE — Discharge Summary (Signed)
[facility] [name]    MR#:  [id number]  DATE OF BIRTH:  05-07-1926  DATE OF ADMISSION:  12/14/2015 ADMITTING PHYSICIAN: Bettey Costa, MD  DATE OF DISCHARGE: No discharge date for patient encounter.  PRIMARY CARE PHYSICIAN: [name], MD   ADMISSION DIAGNOSIS:  Femur fracture, left ([place]) [S72.92XA]  DISCHARGE DIAGNOSIS:  Active Problems:   Femur fracture, left (HCC)   Pressure injury of skin   Closed left subtrochanteric femur fracture (HCC)   SECONDARY DIAGNOSIS:   Past Medical History:  Diagnosis Date  . CAD (coronary artery disease)   . Cardiomyopathy ([place])   . IBS (irritable bowel syndrome) 2010  . Kidney failure July 2012  . Kidney failure   . Meniere disease   . Meniere's disease   . Myocardial infarction   . OSA (obstructive sleep apnea)   . Peritoneal dialysis status ([place])   . Presence of permanent cardiac pacemaker      ADMITTING HISTORY  Sharon [name]  is a 82 y.o. female with a known history of left knee replacement, chronic Systolic heart failure and end-stage renal disease on peritoneal dialysis who presents to the emergency room after a mechanical fall due to loss of balance and left knee pain. Patient denies loss of consciousness or hitting her head. She has left hip pain and inability to move the left leg due to pain. Xray shows Oblique nondisplaced distal femoral fracture extending to the prosthesis. Orthopedic surgeon was called from the emergency room to evaluate patient.  HOSPITAL COURSE:   82 year old female with left knee prosthesis status post mechanical fall and now with distal femoral fracture extending to prosthesis.  1. Distal femoral fracture: Surgery 12/15/2015 Lovenox for total 30 days Physical therapy to work with patient Pain medications  * Acute blood loss anemia - stable No need for transfusion at this time  2. Hypothyroid: Continue Synthroid  3.  Chronic systolic heart failure, [place] Heart Association class III, EF 20%: Continue beta blocker. Not on ACE inhibitor due to intolerance as per cardiology note  4. End-stage renal disease on peritoneal dialysis Nephrology on board Continue HD thru permcath  5. OSA: CPAP will be ordered if she wants to use it.  6. CAD followed by cardiology: Continue beta blocker.  She is not on statin due to significant side effects as per cardiology note.  Discharge to SNF after perm cath placement  CONSULTS OBTAINED:  Treatment Team:  Oletta Cohn, DO Dereck Leep, MD  DRUG ALLERGIES:   Allergies  Allergen Reactions  . Effexor [Venlafaxine]   . Statins Other (See Comments)    Muscle weakness  . Codeine Other (See Comments)    GI UPSET    DISCHARGE MEDICATIONS:   Current Discharge Medication List    START taking these medications   Details  enoxaparin (LOVENOX) 30 MG/0.3ML injection Inject 0.3 mLs (30 mg total) into the skin daily. Qty: 12 Syringe, Refills: 0    senna-docusate (SENOKOT-S) 8.6-50 MG tablet Take 1 tablet by mouth 2 (two) times daily.      CONTINUE these medications which have CHANGED   Details  traMADol (ULTRAM) 50 MG tablet Take 1 tablet (50 mg total) by mouth every 6 (six) hours as needed. Qty: 20 tablet, Refills: 0      CONTINUE these medications which have NOT CHANGED   Details  calcitRIOL (ROCALTROL) 0.25 MCG capsule Take 0.25 mcg by mouth daily.    diphenoxylate-atropine (  LOMOTIL) 2.5-0.025 MG tablet TAKE 1 TABLET BY MOUTH FOUR TIMES DAILY AS NEEDED FOR DIARRHEA OR LOOSE STOOLS Qty: 30 tablet, Refills: 5    folic acid-vitamin b complex-vitamin c-selenium-zinc (DIALYVITE) 3 MG TABS tablet Take 1 tablet by mouth daily. Qty: 30 tablet, Refills: 5    levothyroxine (SYNTHROID, LEVOTHROID) 25 MCG tablet Take 1 tablet (25 mcg total) by mouth daily. Qty: 90 tablet, Refills: 1    meclizine (ANTIVERT) 25 MG tablet Take 1 tablet (25 mg total) by mouth  3 (three) times daily as needed for dizziness. Qty: 30 tablet, Refills: 0   Associated Diagnoses: Imbalance    sertraline (ZOLOFT) 50 MG tablet TAKE 3 TABLETS(150 MG) BY MOUTH DAILY Qty: 90 tablet, Refills: 2    TOPROL XL 25 MG 24 hr tablet TAKE 1/2 BY MOUTH DAILY Qty: 30 tablet, Refills: 11    Vitamin D, Ergocalciferol, (DRISDOL) 50000 units CAPS capsule Take 50,000 Units by mouth every 7 (seven) days.       STOP taking these medications     doxycycline (VIBRA-TABS) 100 MG tablet         Today   VITAL SIGNS:  Blood pressure (!) 133/57, pulse 66, temperature 98.8 F (37.1 C), temperature source Oral, resp. rate 18, height 5\' 4"  (1.626 m), weight 55.8 kg (123 lb), SpO2 96 %.  I/O:   Intake/Output Summary (Last 24 hours) at 12/18/15 0950 Last data filed at 12/18/15 [id number]  Gross per 24 hour  Intake              240 ml  Output               24 ml  Net              216 ml    PHYSICAL EXAMINATION:  Physical Exam  GENERAL:  82 y.o.-year-old patient lying in the bed with no acute distress.  LUNGS: Normal breath sounds bilaterally, no wheezing, rales,rhonchi or crepitation. No use of accessory muscles of respiration.  CARDIOVASCULAR: S1, S2 normal. No murmurs, rubs, or gallops.  ABDOMEN: Soft, non-tender, non-distended. Bowel sounds present. No organomegaly or mass.  NEUROLOGIC: Moves all 4 extremities. PSYCHIATRIC: The patient is alert and oriented x 3.  SKIN: No obvious rash, lesion, or ulcer.  Dressing Left knee  DATA REVIEW:   CBC  Recent Labs Lab 12/16/15 0352  12/18/15 0521  WBC 9.9  --   --   HGB 8.7*  < > 8.8*  HCT 25.2*  --   --   PLT 152  --   --   < > = values in this interval not displayed.  Chemistries   Recent Labs Lab 12/16/15 0352  12/18/15 0521  NA 138  < > 136  K 3.8  < > 4.1  CL 105  < > 101  CO2 25  < > 29  GLUCOSE 112*  < > 129*  BUN 39*  < > 43*  CREATININE 2.27*  < > 2.08*  CALCIUM 8.2*  < > 8.7*  AST 36  --   --   ALT 16  --    --   ALKPHOS 55  --   --   BILITOT 0.7  --   --   < > = values in this interval not displayed.  Cardiac Enzymes No results for input(s): TROPONINI in the last 168 hours.  Microbiology Results  Results for orders placed or performed during the hospital encounter of 12/14/15  MRSA PCR Screening  Status: None   Collection Time: 12/14/15  4:43 PM  Result Value Ref Range Status   MRSA by PCR NEGATIVE NEGATIVE Final    Comment:        The GeneXpert MRSA Assay (FDA approved for NASAL specimens only), is one component of a comprehensive MRSA colonization surveillance program. It is not intended to diagnose MRSA infection nor to guide or monitor treatment for MRSA infections.     RADIOLOGY:  No results found.  Follow up with PCP in 1 week.  Management plans discussed with the patient, family and they are in agreement.  CODE STATUS:     Code Status Orders        Start     Ordered   12/15/15 1516  Full code  Continuous     12/15/15 1515    Code Status History    Date Active Date Inactive Code Status Order ID Comments User Context   12/14/2015  4:13 PM 12/15/2015  3:15 PM Full Code [id number]  Bettey Costa, MD Inpatient    Advance Directive Documentation   Flowsheet Row Most Recent Value  Type of Advance Directive  Advance instruction for mental health treatment  Pre-existing out of facility DNR order (yellow form or pink MOST form)  No data  "MOST" Form in Place?  No data      TOTAL TIME TAKING CARE OF THIS PATIENT ON DAY OF DISCHARGE: more than 30 minutes.   [name] R M.D on 12/18/2015 at 9:50 AM  Between 7am to 6pm - Pager - [phone]  After 6pm go to [email or web address] - password [email or web address] [facility]  Office  [phone]  CC: Primary care physician; [name], MD  Note: This dictation was prepared with Dragon dictation along with smaller phrase technology. Any transcriptional errors that result from this process are  unintentional.

## 2015-12-18 NOTE — Progress Notes (Signed)
Dialysis complete

## 2015-12-18 NOTE — Progress Notes (Addendum)
Pt alert and oriented. Sitting in the chair most of the day after her physical therapy session.

## 2015-12-18 NOTE — Progress Notes (Signed)
PT Cancellation Note  Patient Details Name: LALIA JOHNSTON MRN: [id number] DOB: Jun 25, 1926   Cancelled Treatment:    Reason Eval/Treat Not Completed: Patient at procedure or test/unavailable. Treatment attempted, pt out of room to hemodialysis. Pt has a discharge to skilled nursing facility today. Re attempt treatment at a later time if schedule and discharge transfer time allows.    Larae Grooms, PTA 12/18/2015, 10:55 AM

## 2015-12-18 NOTE — Care Management Important Message (Signed)
Important Message  Patient Details  [name] MRN: [id number] Date of Birth: 15-Apr-1926   Medicare Important Message Given:  Yes    Jolly Mango, RN 12/18/2015, 12:42 PM

## 2015-12-18 NOTE — Progress Notes (Signed)
Patient is medically stable for D/C to [facility] today. Per Sharon [name] [facility] liaision patient will go to room 601. RN will call report to RN Yaakov Guthrie and arrange EMS for transport. Clinical Education officer, museum (CSW) sent D/C orders to [employer] via [employer]. Patient's niece Sharon [name] is aware of above. CSW attempted to contact patient's brother Sharon [name] however the line was busy. Please reconsult if future social work needs arise. CSW signing off.   [employer], LCSW [phone]

## 2015-12-18 NOTE — Progress Notes (Addendum)
PT Cancellation Note  Patient Details [name] MRN: [id number] DOB: Jul 02, 1926   Cancelled Treatment:    Reason Eval/Treat Not Completed: Fatigue/lethargy limiting ability to participate Treatment attempted this afternoon, pt soundly sleeping and unable to awaken post return from hemodialysis. Pt has current discharge order to skilled nursing facility today and is dressed in transfer gown with transfer sheet in place.    Larae Grooms, PTA 12/18/2015, 1:17 PM

## 2015-12-20 DIAGNOSIS — D631 Anemia in chronic kidney disease: Secondary | ICD-10-CM | POA: Diagnosis not present

## 2015-12-20 DIAGNOSIS — D509 Iron deficiency anemia, unspecified: Secondary | ICD-10-CM | POA: Diagnosis not present

## 2015-12-20 DIAGNOSIS — N2581 Secondary hyperparathyroidism of renal origin: Secondary | ICD-10-CM | POA: Diagnosis not present

## 2015-12-20 DIAGNOSIS — N186 End stage renal disease: Secondary | ICD-10-CM | POA: Diagnosis not present

## 2015-12-20 DIAGNOSIS — Z992 Dependence on renal dialysis: Secondary | ICD-10-CM | POA: Diagnosis not present

## 2015-12-22 DIAGNOSIS — S82009A Unspecified fracture of unspecified patella, initial encounter for closed fracture: Secondary | ICD-10-CM | POA: Diagnosis not present

## 2015-12-22 DIAGNOSIS — N186 End stage renal disease: Secondary | ICD-10-CM | POA: Diagnosis not present

## 2015-12-22 DIAGNOSIS — I509 Heart failure, unspecified: Secondary | ICD-10-CM | POA: Diagnosis not present

## 2015-12-22 DIAGNOSIS — E039 Hypothyroidism, unspecified: Secondary | ICD-10-CM | POA: Diagnosis not present

## 2015-12-22 DIAGNOSIS — D519 Vitamin B12 deficiency anemia, unspecified: Secondary | ICD-10-CM | POA: Diagnosis not present

## 2015-12-23 ENCOUNTER — Ambulatory Visit: Payer: Medicare Other | Admitting: Family Medicine

## 2015-12-23 ENCOUNTER — Encounter: Payer: Self-pay | Admitting: Vascular Surgery

## 2015-12-24 DIAGNOSIS — D509 Iron deficiency anemia, unspecified: Secondary | ICD-10-CM | POA: Diagnosis not present

## 2015-12-24 DIAGNOSIS — N2581 Secondary hyperparathyroidism of renal origin: Secondary | ICD-10-CM | POA: Diagnosis not present

## 2015-12-24 DIAGNOSIS — Z992 Dependence on renal dialysis: Secondary | ICD-10-CM | POA: Diagnosis not present

## 2015-12-24 DIAGNOSIS — N186 End stage renal disease: Secondary | ICD-10-CM | POA: Diagnosis not present

## 2015-12-24 DIAGNOSIS — D631 Anemia in chronic kidney disease: Secondary | ICD-10-CM | POA: Diagnosis not present

## 2015-12-26 DIAGNOSIS — D631 Anemia in chronic kidney disease: Secondary | ICD-10-CM | POA: Diagnosis not present

## 2015-12-26 DIAGNOSIS — N2581 Secondary hyperparathyroidism of renal origin: Secondary | ICD-10-CM | POA: Diagnosis not present

## 2015-12-26 DIAGNOSIS — N186 End stage renal disease: Secondary | ICD-10-CM | POA: Diagnosis not present

## 2015-12-26 DIAGNOSIS — D509 Iron deficiency anemia, unspecified: Secondary | ICD-10-CM | POA: Diagnosis not present

## 2015-12-26 DIAGNOSIS — Z992 Dependence on renal dialysis: Secondary | ICD-10-CM | POA: Diagnosis not present

## 2015-12-28 DIAGNOSIS — Z23 Encounter for immunization: Secondary | ICD-10-CM | POA: Diagnosis not present

## 2015-12-28 DIAGNOSIS — Z992 Dependence on renal dialysis: Secondary | ICD-10-CM | POA: Diagnosis not present

## 2015-12-28 DIAGNOSIS — D631 Anemia in chronic kidney disease: Secondary | ICD-10-CM | POA: Diagnosis not present

## 2015-12-28 DIAGNOSIS — N2581 Secondary hyperparathyroidism of renal origin: Secondary | ICD-10-CM | POA: Diagnosis not present

## 2015-12-28 DIAGNOSIS — N186 End stage renal disease: Secondary | ICD-10-CM | POA: Diagnosis not present

## 2015-12-31 DIAGNOSIS — Z23 Encounter for immunization: Secondary | ICD-10-CM | POA: Diagnosis not present

## 2015-12-31 DIAGNOSIS — N2581 Secondary hyperparathyroidism of renal origin: Secondary | ICD-10-CM | POA: Diagnosis not present

## 2015-12-31 DIAGNOSIS — Z992 Dependence on renal dialysis: Secondary | ICD-10-CM | POA: Diagnosis not present

## 2015-12-31 DIAGNOSIS — N186 End stage renal disease: Secondary | ICD-10-CM | POA: Diagnosis not present

## 2015-12-31 DIAGNOSIS — D631 Anemia in chronic kidney disease: Secondary | ICD-10-CM | POA: Diagnosis not present

## 2016-01-01 DIAGNOSIS — S7292XA Unspecified fracture of left femur, initial encounter for closed fracture: Secondary | ICD-10-CM | POA: Diagnosis not present

## 2016-01-01 DIAGNOSIS — D649 Anemia, unspecified: Secondary | ICD-10-CM | POA: Diagnosis not present

## 2016-01-01 DIAGNOSIS — E039 Hypothyroidism, unspecified: Secondary | ICD-10-CM | POA: Diagnosis not present

## 2016-01-01 DIAGNOSIS — F329 Major depressive disorder, single episode, unspecified: Secondary | ICD-10-CM | POA: Diagnosis not present

## 2016-01-01 DIAGNOSIS — I10 Essential (primary) hypertension: Secondary | ICD-10-CM | POA: Diagnosis not present

## 2016-01-01 DIAGNOSIS — I509 Heart failure, unspecified: Secondary | ICD-10-CM | POA: Diagnosis not present

## 2016-01-01 DIAGNOSIS — N186 End stage renal disease: Secondary | ICD-10-CM | POA: Diagnosis not present

## 2016-01-02 DIAGNOSIS — N186 End stage renal disease: Secondary | ICD-10-CM | POA: Diagnosis not present

## 2016-01-02 DIAGNOSIS — Z23 Encounter for immunization: Secondary | ICD-10-CM | POA: Diagnosis not present

## 2016-01-02 DIAGNOSIS — D631 Anemia in chronic kidney disease: Secondary | ICD-10-CM | POA: Diagnosis not present

## 2016-01-02 DIAGNOSIS — N2581 Secondary hyperparathyroidism of renal origin: Secondary | ICD-10-CM | POA: Diagnosis not present

## 2016-01-02 DIAGNOSIS — Z992 Dependence on renal dialysis: Secondary | ICD-10-CM | POA: Diagnosis not present

## 2016-01-04 DIAGNOSIS — Z23 Encounter for immunization: Secondary | ICD-10-CM | POA: Diagnosis not present

## 2016-01-04 DIAGNOSIS — D631 Anemia in chronic kidney disease: Secondary | ICD-10-CM | POA: Diagnosis not present

## 2016-01-04 DIAGNOSIS — Z992 Dependence on renal dialysis: Secondary | ICD-10-CM | POA: Diagnosis not present

## 2016-01-04 DIAGNOSIS — N186 End stage renal disease: Secondary | ICD-10-CM | POA: Diagnosis not present

## 2016-01-04 DIAGNOSIS — N2581 Secondary hyperparathyroidism of renal origin: Secondary | ICD-10-CM | POA: Diagnosis not present

## 2016-01-07 DIAGNOSIS — Z23 Encounter for immunization: Secondary | ICD-10-CM | POA: Diagnosis not present

## 2016-01-07 DIAGNOSIS — N2581 Secondary hyperparathyroidism of renal origin: Secondary | ICD-10-CM | POA: Diagnosis not present

## 2016-01-07 DIAGNOSIS — Z992 Dependence on renal dialysis: Secondary | ICD-10-CM | POA: Diagnosis not present

## 2016-01-07 DIAGNOSIS — N186 End stage renal disease: Secondary | ICD-10-CM | POA: Diagnosis not present

## 2016-01-07 DIAGNOSIS — D631 Anemia in chronic kidney disease: Secondary | ICD-10-CM | POA: Diagnosis not present

## 2016-01-08 ENCOUNTER — Telehealth: Payer: Self-pay | Admitting: Family Medicine

## 2016-01-08 NOTE — Telephone Encounter (Signed)
Please advise 

## 2016-01-08 NOTE — Telephone Encounter (Signed)
Pt brother Ed called and stated that pt is in [facility] right now for a Left broken femur and had surgery on it. Pt wishes to change her will and Perrin Maltese suggested that we have Dr. Caryl Bis write a letter to make sure that pt is of well mind and body to make these changes. They would like 3 copies as well. Please advise, thank you!  Call Ed @ [phone]

## 2016-01-09 ENCOUNTER — Other Ambulatory Visit: Payer: Self-pay | Admitting: *Deleted

## 2016-01-09 DIAGNOSIS — I10 Essential (primary) hypertension: Secondary | ICD-10-CM | POA: Diagnosis not present

## 2016-01-09 DIAGNOSIS — Z23 Encounter for immunization: Secondary | ICD-10-CM | POA: Diagnosis not present

## 2016-01-09 DIAGNOSIS — E039 Hypothyroidism, unspecified: Secondary | ICD-10-CM | POA: Diagnosis not present

## 2016-01-09 DIAGNOSIS — Z992 Dependence on renal dialysis: Secondary | ICD-10-CM | POA: Diagnosis not present

## 2016-01-09 DIAGNOSIS — N2581 Secondary hyperparathyroidism of renal origin: Secondary | ICD-10-CM | POA: Diagnosis not present

## 2016-01-09 DIAGNOSIS — I509 Heart failure, unspecified: Secondary | ICD-10-CM | POA: Diagnosis not present

## 2016-01-09 DIAGNOSIS — D519 Vitamin B12 deficiency anemia, unspecified: Secondary | ICD-10-CM | POA: Diagnosis not present

## 2016-01-09 DIAGNOSIS — D631 Anemia in chronic kidney disease: Secondary | ICD-10-CM | POA: Diagnosis not present

## 2016-01-09 DIAGNOSIS — N186 End stage renal disease: Secondary | ICD-10-CM | POA: Diagnosis not present

## 2016-01-09 DIAGNOSIS — S7290XA Unspecified fracture of unspecified femur, initial encounter for closed fracture: Secondary | ICD-10-CM | POA: Diagnosis not present

## 2016-01-09 NOTE — Telephone Encounter (Signed)
Please find out if there is concern on the attorney's part. I will need to see the patient in follow-up after she gets out of rehabilitation to determine if she is of well mind and body to make these changes. Thanks.

## 2016-01-09 NOTE — Patient Outreach (Signed)
[facility]) Care Management Post-Acute Care Coordination  01/09/2016  Sharon [name] 1926-08-29 [id number]   Met with Dimple Nanas, LCSW for [facility]. Reviewed patient case. He confirms that patient plan is for ALF upon discharge. No [facility] program services needed at this time.  RNCM will sign off case. LCSW will let RNCM know if discharge plans or needs change.  Royetta Crochet. Laymond Purser, RN, BSN, [facility] Post-Acute Care Coordinator [phone]

## 2016-01-10 NOTE — Telephone Encounter (Signed)
Informed him he will need to have the patient call and set up an appointment

## 2016-01-11 DIAGNOSIS — N2581 Secondary hyperparathyroidism of renal origin: Secondary | ICD-10-CM | POA: Diagnosis not present

## 2016-01-11 DIAGNOSIS — D631 Anemia in chronic kidney disease: Secondary | ICD-10-CM | POA: Diagnosis not present

## 2016-01-11 DIAGNOSIS — Z23 Encounter for immunization: Secondary | ICD-10-CM | POA: Diagnosis not present

## 2016-01-11 DIAGNOSIS — N186 End stage renal disease: Secondary | ICD-10-CM | POA: Diagnosis not present

## 2016-01-11 DIAGNOSIS — Z992 Dependence on renal dialysis: Secondary | ICD-10-CM | POA: Diagnosis not present

## 2016-01-14 ENCOUNTER — Emergency Department: Payer: Medicare Other

## 2016-01-14 ENCOUNTER — Emergency Department
Admission: EM | Admit: 2016-01-14 | Discharge: 2016-01-14 | Disposition: A | Discharge status: Home / Self Care | Payer: Medicare Other | Attending: Emergency Medicine | Admitting: Emergency Medicine

## 2016-01-14 ENCOUNTER — Encounter: Payer: Self-pay | Admitting: *Deleted

## 2016-01-14 DIAGNOSIS — S299XXA Unspecified injury of thorax, initial encounter: Secondary | ICD-10-CM | POA: Diagnosis not present

## 2016-01-14 DIAGNOSIS — I509 Heart failure, unspecified: Secondary | ICD-10-CM | POA: Insufficient documentation

## 2016-01-14 DIAGNOSIS — R0602 Shortness of breath: Secondary | ICD-10-CM | POA: Diagnosis not present

## 2016-01-14 DIAGNOSIS — Y999 Unspecified external cause status: Secondary | ICD-10-CM | POA: Insufficient documentation

## 2016-01-14 DIAGNOSIS — Z23 Encounter for immunization: Secondary | ICD-10-CM | POA: Diagnosis not present

## 2016-01-14 DIAGNOSIS — Z950 Presence of cardiac pacemaker: Secondary | ICD-10-CM | POA: Insufficient documentation

## 2016-01-14 DIAGNOSIS — I2510 Atherosclerotic heart disease of native coronary artery without angina pectoris: Secondary | ICD-10-CM | POA: Insufficient documentation

## 2016-01-14 DIAGNOSIS — Z7901 Long term (current) use of anticoagulants: Secondary | ICD-10-CM | POA: Insufficient documentation

## 2016-01-14 DIAGNOSIS — D631 Anemia in chronic kidney disease: Secondary | ICD-10-CM | POA: Diagnosis not present

## 2016-01-14 DIAGNOSIS — N185 Chronic kidney disease, stage 5: Secondary | ICD-10-CM | POA: Insufficient documentation

## 2016-01-14 DIAGNOSIS — Y939 Activity, unspecified: Secondary | ICD-10-CM | POA: Insufficient documentation

## 2016-01-14 DIAGNOSIS — S0990XA Unspecified injury of head, initial encounter: Secondary | ICD-10-CM | POA: Diagnosis not present

## 2016-01-14 DIAGNOSIS — W06XXXA Fall from bed, initial encounter: Secondary | ICD-10-CM | POA: Insufficient documentation

## 2016-01-14 DIAGNOSIS — N186 End stage renal disease: Secondary | ICD-10-CM | POA: Diagnosis not present

## 2016-01-14 DIAGNOSIS — S199XXA Unspecified injury of neck, initial encounter: Secondary | ICD-10-CM | POA: Diagnosis not present

## 2016-01-14 DIAGNOSIS — Z79899 Other long term (current) drug therapy: Secondary | ICD-10-CM | POA: Insufficient documentation

## 2016-01-14 DIAGNOSIS — I132 Hypertensive heart and chronic kidney disease with heart failure and with stage 5 chronic kidney disease, or end stage renal disease: Secondary | ICD-10-CM | POA: Insufficient documentation

## 2016-01-14 DIAGNOSIS — W19XXXA Unspecified fall, initial encounter: Secondary | ICD-10-CM

## 2016-01-14 DIAGNOSIS — Y929 Unspecified place or not applicable: Secondary | ICD-10-CM | POA: Insufficient documentation

## 2016-01-14 DIAGNOSIS — Z992 Dependence on renal dialysis: Secondary | ICD-10-CM | POA: Diagnosis not present

## 2016-01-14 DIAGNOSIS — N2581 Secondary hyperparathyroidism of renal origin: Secondary | ICD-10-CM | POA: Diagnosis not present

## 2016-01-14 DIAGNOSIS — M542 Cervicalgia: Secondary | ICD-10-CM | POA: Insufficient documentation

## 2016-01-14 MED ORDER — AZITHROMYCIN 250 MG PO TABS: ORAL_TABLET | ORAL | 0 refills | Status: AC

## 2016-01-14 NOTE — Discharge Instructions (Signed)
Please seek medical attention for any high fevers, chest pain, shortness of breath, change in behavior, persistent vomiting, bloody stool or any other new or concerning symptoms.  

## 2016-01-14 NOTE — ED Notes (Signed)
Patient transported to CT 

## 2016-01-14 NOTE — ED Notes (Signed)
Report off to kailey rn  

## 2016-01-14 NOTE — ED Notes (Addendum)
md at bedside.  Pt has  Bruising to right forehead.  No loc.  No vomiting.  Pt alert

## 2016-01-14 NOTE — ED Provider Notes (Signed)
[facility] Emergency Department Provider Note  ____________________________________________   I have reviewed the triage vital signs and the nursing notes.   HISTORY  Chief Complaint Fall  History limited by: Not Limited   HPI Sharon [name] is a 82 y.o. female who presents to the emergency department today because of concern for head injury after fall. Patient states this is now the third time she has fallen and hit her head. She rolled out of bed. She is primarily complaining of pain in her neck. She denies any LOC. Denies any recent illness, chest pain. She has chronic SOB which has not changed.    Past Medical History:  Diagnosis Date  . CAD (coronary artery disease)   . Cardiomyopathy ([place])   . IBS (irritable bowel syndrome) 2010  . Kidney failure July 2012  . Kidney failure   . Meniere disease   . Meniere's disease   . Myocardial infarction   . OSA (obstructive sleep apnea)   . Peritoneal dialysis status ([place])   . Presence of permanent cardiac pacemaker     Patient Active Problem List   Diagnosis Date Noted  . Pressure injury of skin 12/15/2015  . Closed left subtrochanteric femur fracture ([place]) 12/15/2015  . Femur fracture, left ([place]) 12/14/2015  . Meniere disease   . Loss of weight 10/21/2015  . Skin tear of elbow without complication [id number]  . Clinical depression 06/20/2015  . Combined fat and carbohydrate induced hyperlipemia 06/20/2015  . Dysphagia 05/24/2015  . Dyspnea 05/24/2015  . Imbalance 04/22/2015  . Electrical shock sensation in right posterior head 11/22/2014  . Fall at home 11/15/2014  . Expressive aphasia 10/04/2014  . Breathlessness on exertion 09/13/2014  . Chronic pain syndrome 05/22/2014  . Insomnia 03/13/2014  . Anxiety state 03/13/2014  . Arteriosclerosis of coronary artery 01/15/2014  . Chronic systolic heart failure ([place]) 01/15/2014  . Obstructive apnea 01/15/2014  . Basal cell carcinoma of neck  11/14/2013  . Degeneration of intervertebral disc of cervical region 11/07/2013  . Cervical radiculitis 10/16/2013  . Pelvic pain in female 09/12/2013  . Neck pain of over 3 months duration 09/12/2013  . Memory loss 09/12/2013  . Systolic heart failure, chronic ([place]) 05/12/2013  . Medicare annual wellness visit, subsequent 11/10/2012  . Sinus node dysfunction ([place]) 08/01/2012  . Low back pain 08/01/2012  . Cervical spine pain 05/02/2012  . Irritable bowel syndrome 11/02/2011  . Depression 04/01/2011  . Hypertension 04/01/2011  . Menopausal disorder 04/01/2011  . Screening for breast cancer 04/01/2011  . Chronic kidney disease, stage 5, kidney failure ([place]) 04/01/2011    Past Surgical History:  Procedure Laterality Date  . [place]  . CATARACT EXTRACTION  2006, 2011  . CHOLECYSTECTOMY  01/2008  . FEMUR IM NAIL Left 12/15/2015   Procedure: INTRAMEDULLARY (IM) RETROGRADE FEMORAL NAILING;  Surgeon: Oletta Cohn, DO;  Location: [facility] ORS;  Service: Orthopedics;  Laterality: Left;  . INSERTION OF DIALYSIS CATHETER  07/2010  . PACEMAKER INSERTION  2006  . PERIPHERAL VASCULAR CATHETERIZATION N/A 12/18/2015   Procedure: Dialysis/Perma Catheter Insertion;  Surgeon: Katha Cabal, MD;  Location: [facility];  Service: Cardiovascular;  Laterality: N/A;  . TOTAL KNEE ARTHROPLASTY  2008   LEFT/Dr Calif    Prior to Admission medications   Medication Sig Start Date End Date Taking? Authorizing Provider  calcitRIOL (ROCALTROL) 0.25 MCG capsule Take 0.25 mcg by mouth daily.    Historical Provider, MD  diphenoxylate-atropine (LOMOTIL)  2.5-0.025 MG tablet TAKE 1 TABLET BY MOUTH FOUR TIMES DAILY AS NEEDED FOR DIARRHEA OR LOOSE STOOLS 08/30/15   [name], MD  enoxaparin (LOVENOX) 30 MG/0.3ML injection Inject 0.3 mLs (30 mg total) into the skin daily. 12/17/15   [name], MD  folic acid-vitamin b complex-vitamin c-selenium-zinc (DIALYVITE) 3 MG TABS tablet  Take 1 tablet by mouth daily. 12/08/12   [name], MD  levothyroxine (SYNTHROID, LEVOTHROID) 25 MCG tablet Take 1 tablet (25 mcg total) by mouth daily. 10/25/15   [name], MD  meclizine (ANTIVERT) 25 MG tablet Take 1 tablet (25 mg total) by mouth 3 (three) times daily as needed for dizziness. 04/22/15   [name], MD  senna-docusate (SENOKOT-S) 8.6-50 MG tablet Take 1 tablet by mouth 2 (two) times daily. 12/17/15   Srikar Sudini, MD  sertraline (ZOLOFT) 50 MG tablet TAKE 3 TABLETS(150 MG) BY MOUTH DAILY 08/30/15   [name], MD  TOPROL XL 25 MG 24 hr tablet TAKE 1/2 BY MOUTH DAILY 02/21/15   [name], MD  traMADol (ULTRAM) 50 MG tablet Take 1 tablet (50 mg total) by mouth every 6 (six) hours as needed. 12/17/15   Srikar Sudini, MD  Vitamin D, Ergocalciferol, (DRISDOL) 50000 units CAPS capsule Take 50,000 Units by mouth every 7 (seven) days.  04/17/15   Historical Provider, MD    Allergies Effexor [venlafaxine]; Statins; and Codeine  Family History  Problem Relation Age of Onset  . Cancer Mother     ? ovarian - sarcoma  . Cancer Father     Skin cancer  . Diabetes Sister   . COPD Sister   . Depression Sister   . Cancer Sister     Lung - 54 yrs old    Social History Social History  Substance Use Topics  . Smoking status: Former Smoker    Quit date: 03/31/1948  . Smokeless tobacco: Never Used  . Alcohol use No    Review of Systems  Constitutional: Negative for fever. Cardiovascular: Negative for chest pain. Respiratory: Negative for shortness of breath. Gastrointestinal: Negative for abdominal pain, vomiting and diarrhea. Genitourinary: Negative for dysuria. Musculoskeletal: Positive for neck pain. Skin: Negative for rash. Neurological: Negative for headaches, focal weakness or numbness.  10-point ROS otherwise negative.  ____________________________________________   PHYSICAL EXAM:  VITAL SIGNS: ED Triage Vitals  Enc Vitals Group      BP 142/70     Pulse 72     Resp 18     Temp 98.1     Temp src      SpO2 96     Weight    Constitutional: Alert and oriented. Well appearing and in no distress. Eyes: Conjunctivae are normal. Normal extraocular movements. ENT   Head: Normocephalic and atraumatic.   Nose: No congestion/rhinnorhea.   Mouth/Throat: Mucous membranes are moist.   Neck: No stridor. Hematological/Lymphatic/Immunilogical: No cervical lymphadenopathy. Cardiovascular: Normal rate, regular rhythm.  No murmurs, rubs, or gallops.  Respiratory: Normal respiratory effort without tachypnea nor retractions. Breath sounds are clear and equal bilaterally. No wheezes/rales/rhonchi. Gastrointestinal: Soft and non tender. No rebound. No guarding.  Genitourinary: Deferred Musculoskeletal: Normal range of motion in all extremities. No lower extremity edema. Neurologic:  Normal speech and language. No gross focal neurologic deficits are appreciated.  Skin:  Skin is warm, dry and intact. No rash noted. Psychiatric: Mood and affect are normal. Speech and behavior are normal. Patient exhibits appropriate insight and judgment.  ____________________________________________  LABS (pertinent positives/negatives)  None  ____________________________________________   EKG  None  ____________________________________________    RADIOLOGY  CT head/cervical spine IMPRESSION:  Hematoma over the forehead without underlying fracture or acute  intracranial abnormality.    No acute abnormality cervical spine.    Patchy airspace disease in the upper lobes bilaterally with small  effusions worrisome for pneumonia. Recommend correlation with plain  films.    Atrophy and chronic microvascular ischemic change.    Atherosclerosis.     CXR   IMPRESSION:  1. Small bilateral pleural effusions with bibasilar atelectasis or  infiltrate  2. Stable cardiomegaly with central vascular congestion and mild   interstitial perihilar edema.  3. Atherosclerosis of the aorta         ____________________________________________   PROCEDURES  Procedures  ____________________________________________   INITIAL IMPRESSION / ASSESSMENT AND PLAN / ED COURSE  Pertinent labs & imaging results that were available during my care of the patient were reviewed by me and considered in my medical decision making (see chart for details).  Patient presented to the emergency department today because of concerns for a fall. Did hit her head. CT head and cervical spine negative for any acute osseous injury or intracranial process. However CT of the cervical spine did show some concerning findings of the upper lip. Chest x-ray was obtained which cannot rule out infiltrates. Patient does have history of chronic shortness of breath since a little hard to assess. However this could explain some of the patient's falls. Will plan on placing patient on antibiotics for pneumonia.  ____________________________________________   FINAL CLINICAL IMPRESSION(S) / ED DIAGNOSES  Final diagnoses:  Fall, initial encounter  Shortness of breath     Note: This dictation was prepared with Diplomatic Services operational officer dictation. Any transcriptional errors that result from this process are unintentional     Nance Pear, MD 01/14/16 1851

## 2016-01-14 NOTE — ED Triage Notes (Signed)
Pt brought in via ems from [facility].  Pt rolled out of bed today and has bruising to forehead.  No loc.  No vomiting.  Alert on arrival.  Pt in hallway bed.

## 2016-01-16 DIAGNOSIS — D631 Anemia in chronic kidney disease: Secondary | ICD-10-CM | POA: Diagnosis not present

## 2016-01-16 DIAGNOSIS — N186 End stage renal disease: Secondary | ICD-10-CM | POA: Diagnosis not present

## 2016-01-16 DIAGNOSIS — Z23 Encounter for immunization: Secondary | ICD-10-CM | POA: Diagnosis not present

## 2016-01-16 DIAGNOSIS — M9712XD Periprosthetic fracture around internal prosthetic left knee joint, subsequent encounter: Secondary | ICD-10-CM | POA: Diagnosis not present

## 2016-01-16 DIAGNOSIS — N2581 Secondary hyperparathyroidism of renal origin: Secondary | ICD-10-CM | POA: Diagnosis not present

## 2016-01-16 DIAGNOSIS — Z992 Dependence on renal dialysis: Secondary | ICD-10-CM | POA: Diagnosis not present

## 2016-01-18 DIAGNOSIS — N2581 Secondary hyperparathyroidism of renal origin: Secondary | ICD-10-CM | POA: Diagnosis not present

## 2016-01-18 DIAGNOSIS — Z992 Dependence on renal dialysis: Secondary | ICD-10-CM | POA: Diagnosis not present

## 2016-01-18 DIAGNOSIS — Z23 Encounter for immunization: Secondary | ICD-10-CM | POA: Diagnosis not present

## 2016-01-18 DIAGNOSIS — N186 End stage renal disease: Secondary | ICD-10-CM | POA: Diagnosis not present

## 2016-01-18 DIAGNOSIS — D631 Anemia in chronic kidney disease: Secondary | ICD-10-CM | POA: Diagnosis not present

## 2016-01-21 DIAGNOSIS — N186 End stage renal disease: Secondary | ICD-10-CM | POA: Diagnosis not present

## 2016-01-21 DIAGNOSIS — D631 Anemia in chronic kidney disease: Secondary | ICD-10-CM | POA: Diagnosis not present

## 2016-01-21 DIAGNOSIS — Z992 Dependence on renal dialysis: Secondary | ICD-10-CM | POA: Diagnosis not present

## 2016-01-21 DIAGNOSIS — Z23 Encounter for immunization: Secondary | ICD-10-CM | POA: Diagnosis not present

## 2016-01-21 DIAGNOSIS — N2581 Secondary hyperparathyroidism of renal origin: Secondary | ICD-10-CM | POA: Diagnosis not present

## 2016-01-23 ENCOUNTER — Telehealth: Payer: Self-pay | Admitting: Family Medicine

## 2016-01-23 DIAGNOSIS — Z992 Dependence on renal dialysis: Secondary | ICD-10-CM | POA: Diagnosis not present

## 2016-01-23 DIAGNOSIS — Z23 Encounter for immunization: Secondary | ICD-10-CM | POA: Diagnosis not present

## 2016-01-23 DIAGNOSIS — N186 End stage renal disease: Secondary | ICD-10-CM | POA: Diagnosis not present

## 2016-01-23 DIAGNOSIS — D631 Anemia in chronic kidney disease: Secondary | ICD-10-CM | POA: Diagnosis not present

## 2016-01-23 DIAGNOSIS — N2581 Secondary hyperparathyroidism of renal origin: Secondary | ICD-10-CM | POA: Diagnosis not present

## 2016-01-23 NOTE — Telephone Encounter (Signed)
Pt brother called about pt is in [facility] after being discharged from the hospital from from fall 12/14/15 and surgery was on 12/15/15. [facility]. Pt wanted Dr Caryl Bis to know. Thank you!  Pt room number is 601 pt number is [id number].  Main number is [phone].

## 2016-01-23 NOTE — Telephone Encounter (Signed)
fyi

## 2016-01-25 DIAGNOSIS — Z23 Encounter for immunization: Secondary | ICD-10-CM | POA: Diagnosis not present

## 2016-01-25 DIAGNOSIS — Z992 Dependence on renal dialysis: Secondary | ICD-10-CM | POA: Diagnosis not present

## 2016-01-25 DIAGNOSIS — N186 End stage renal disease: Secondary | ICD-10-CM | POA: Diagnosis not present

## 2016-01-25 DIAGNOSIS — N2581 Secondary hyperparathyroidism of renal origin: Secondary | ICD-10-CM | POA: Diagnosis not present

## 2016-01-25 DIAGNOSIS — D631 Anemia in chronic kidney disease: Secondary | ICD-10-CM | POA: Diagnosis not present

## 2016-01-26 DIAGNOSIS — M9712XA Periprosthetic fracture around internal prosthetic left knee joint, initial encounter: Secondary | ICD-10-CM | POA: Insufficient documentation

## 2016-01-26 DIAGNOSIS — N186 End stage renal disease: Secondary | ICD-10-CM | POA: Diagnosis not present

## 2016-01-26 DIAGNOSIS — Z992 Dependence on renal dialysis: Secondary | ICD-10-CM | POA: Diagnosis not present

## 2016-01-27 DIAGNOSIS — F419 Anxiety disorder, unspecified: Secondary | ICD-10-CM | POA: Diagnosis not present

## 2016-01-27 DIAGNOSIS — F329 Major depressive disorder, single episode, unspecified: Secondary | ICD-10-CM | POA: Diagnosis not present

## 2016-01-27 DIAGNOSIS — E039 Hypothyroidism, unspecified: Secondary | ICD-10-CM | POA: Diagnosis not present

## 2016-01-27 DIAGNOSIS — I509 Heart failure, unspecified: Secondary | ICD-10-CM | POA: Diagnosis not present

## 2016-01-27 DIAGNOSIS — I10 Essential (primary) hypertension: Secondary | ICD-10-CM | POA: Diagnosis not present

## 2016-01-27 DIAGNOSIS — N186 End stage renal disease: Secondary | ICD-10-CM | POA: Diagnosis not present

## 2016-01-28 DIAGNOSIS — N186 End stage renal disease: Secondary | ICD-10-CM | POA: Diagnosis not present

## 2016-01-28 DIAGNOSIS — Z992 Dependence on renal dialysis: Secondary | ICD-10-CM | POA: Diagnosis not present

## 2016-01-28 DIAGNOSIS — D631 Anemia in chronic kidney disease: Secondary | ICD-10-CM | POA: Diagnosis not present

## 2016-01-28 DIAGNOSIS — D509 Iron deficiency anemia, unspecified: Secondary | ICD-10-CM | POA: Diagnosis not present

## 2016-01-28 DIAGNOSIS — N2581 Secondary hyperparathyroidism of renal origin: Secondary | ICD-10-CM | POA: Diagnosis not present

## 2016-01-29 ENCOUNTER — Other Ambulatory Visit
Admission: RE | Admit: 2016-01-29 | Discharge: 2016-01-29 | Disposition: A | Discharge status: Home / Self Care | Payer: No Typology Code available for payment source | Source: Ambulatory Visit | Attending: Family Medicine | Admitting: Family Medicine

## 2016-01-29 DIAGNOSIS — J159 Unspecified bacterial pneumonia: Secondary | ICD-10-CM | POA: Insufficient documentation

## 2016-01-30 ENCOUNTER — Other Ambulatory Visit: Payer: Self-pay

## 2016-01-30 DIAGNOSIS — Z992 Dependence on renal dialysis: Secondary | ICD-10-CM | POA: Diagnosis not present

## 2016-01-30 DIAGNOSIS — N186 End stage renal disease: Secondary | ICD-10-CM | POA: Diagnosis not present

## 2016-01-30 DIAGNOSIS — D509 Iron deficiency anemia, unspecified: Secondary | ICD-10-CM | POA: Diagnosis not present

## 2016-01-30 DIAGNOSIS — D631 Anemia in chronic kidney disease: Secondary | ICD-10-CM | POA: Diagnosis not present

## 2016-01-30 DIAGNOSIS — N2581 Secondary hyperparathyroidism of renal origin: Secondary | ICD-10-CM | POA: Diagnosis not present

## 2016-01-30 MED ORDER — METOPROLOL SUCCINATE ER 25 MG PO TB24: ORAL_TABLET | ORAL | 11 refills | Status: DC

## 2016-01-30 NOTE — Telephone Encounter (Signed)
Last filled by Dr.Walker 02/21/15 30 11rf

## 2016-01-30 NOTE — Telephone Encounter (Signed)
Sent to pharmacy 

## 2016-01-31 ENCOUNTER — Encounter (INDEPENDENT_AMBULATORY_CARE_PROVIDER_SITE_OTHER): Payer: Medicare Other | Admitting: Vascular Surgery

## 2016-01-31 LAB — LEGIONELLA PNEUMOPHILA SEROGP 1 UR AG: L. PNEUMOPHILA SEROGP 1 UR AG: NEGATIVE

## 2016-02-01 DIAGNOSIS — D509 Iron deficiency anemia, unspecified: Secondary | ICD-10-CM | POA: Diagnosis not present

## 2016-02-01 DIAGNOSIS — N186 End stage renal disease: Secondary | ICD-10-CM | POA: Diagnosis not present

## 2016-02-01 DIAGNOSIS — Z992 Dependence on renal dialysis: Secondary | ICD-10-CM | POA: Diagnosis not present

## 2016-02-01 DIAGNOSIS — N2581 Secondary hyperparathyroidism of renal origin: Secondary | ICD-10-CM | POA: Diagnosis not present

## 2016-02-01 DIAGNOSIS — D631 Anemia in chronic kidney disease: Secondary | ICD-10-CM | POA: Diagnosis not present

## 2016-02-04 ENCOUNTER — Telehealth: Payer: Self-pay | Admitting: Family Medicine

## 2016-02-04 DIAGNOSIS — N186 End stage renal disease: Secondary | ICD-10-CM | POA: Diagnosis not present

## 2016-02-04 DIAGNOSIS — D631 Anemia in chronic kidney disease: Secondary | ICD-10-CM | POA: Diagnosis not present

## 2016-02-04 DIAGNOSIS — D509 Iron deficiency anemia, unspecified: Secondary | ICD-10-CM | POA: Diagnosis not present

## 2016-02-04 DIAGNOSIS — Z992 Dependence on renal dialysis: Secondary | ICD-10-CM | POA: Diagnosis not present

## 2016-02-04 DIAGNOSIS — N2581 Secondary hyperparathyroidism of renal origin: Secondary | ICD-10-CM | POA: Diagnosis not present

## 2016-02-04 NOTE — Telephone Encounter (Signed)
Ed contact number is [phone]

## 2016-02-04 NOTE — Telephone Encounter (Signed)
Pt brother called in regards to a no show fee that pt has received from [facility] 11/27, pt had broke her femur on 11/18 and had informed nurses at either [facility] or [facility] to cancel appt and never did so.

## 2016-02-06 DIAGNOSIS — Z992 Dependence on renal dialysis: Secondary | ICD-10-CM | POA: Diagnosis not present

## 2016-02-06 DIAGNOSIS — N2581 Secondary hyperparathyroidism of renal origin: Secondary | ICD-10-CM | POA: Diagnosis not present

## 2016-02-06 DIAGNOSIS — D509 Iron deficiency anemia, unspecified: Secondary | ICD-10-CM | POA: Diagnosis not present

## 2016-02-06 DIAGNOSIS — N186 End stage renal disease: Secondary | ICD-10-CM | POA: Diagnosis not present

## 2016-02-06 DIAGNOSIS — D631 Anemia in chronic kidney disease: Secondary | ICD-10-CM | POA: Diagnosis not present

## 2016-02-08 DIAGNOSIS — D509 Iron deficiency anemia, unspecified: Secondary | ICD-10-CM | POA: Diagnosis not present

## 2016-02-08 DIAGNOSIS — N2581 Secondary hyperparathyroidism of renal origin: Secondary | ICD-10-CM | POA: Diagnosis not present

## 2016-02-08 DIAGNOSIS — Z992 Dependence on renal dialysis: Secondary | ICD-10-CM | POA: Diagnosis not present

## 2016-02-08 DIAGNOSIS — N186 End stage renal disease: Secondary | ICD-10-CM | POA: Diagnosis not present

## 2016-02-08 DIAGNOSIS — D631 Anemia in chronic kidney disease: Secondary | ICD-10-CM | POA: Diagnosis not present

## 2016-02-11 DIAGNOSIS — D631 Anemia in chronic kidney disease: Secondary | ICD-10-CM | POA: Diagnosis not present

## 2016-02-11 DIAGNOSIS — D509 Iron deficiency anemia, unspecified: Secondary | ICD-10-CM | POA: Diagnosis not present

## 2016-02-11 DIAGNOSIS — Z992 Dependence on renal dialysis: Secondary | ICD-10-CM | POA: Diagnosis not present

## 2016-02-11 DIAGNOSIS — N2581 Secondary hyperparathyroidism of renal origin: Secondary | ICD-10-CM | POA: Diagnosis not present

## 2016-02-11 DIAGNOSIS — N186 End stage renal disease: Secondary | ICD-10-CM | POA: Diagnosis not present

## 2016-02-13 DIAGNOSIS — D631 Anemia in chronic kidney disease: Secondary | ICD-10-CM | POA: Diagnosis not present

## 2016-02-13 DIAGNOSIS — D509 Iron deficiency anemia, unspecified: Secondary | ICD-10-CM | POA: Diagnosis not present

## 2016-02-13 DIAGNOSIS — N2581 Secondary hyperparathyroidism of renal origin: Secondary | ICD-10-CM | POA: Diagnosis not present

## 2016-02-13 DIAGNOSIS — N186 End stage renal disease: Secondary | ICD-10-CM | POA: Diagnosis not present

## 2016-02-13 DIAGNOSIS — Z992 Dependence on renal dialysis: Secondary | ICD-10-CM | POA: Diagnosis not present

## 2016-02-15 DIAGNOSIS — Z992 Dependence on renal dialysis: Secondary | ICD-10-CM | POA: Diagnosis not present

## 2016-02-15 DIAGNOSIS — D631 Anemia in chronic kidney disease: Secondary | ICD-10-CM | POA: Diagnosis not present

## 2016-02-15 DIAGNOSIS — N2581 Secondary hyperparathyroidism of renal origin: Secondary | ICD-10-CM | POA: Diagnosis not present

## 2016-02-15 DIAGNOSIS — N186 End stage renal disease: Secondary | ICD-10-CM | POA: Diagnosis not present

## 2016-02-15 DIAGNOSIS — D509 Iron deficiency anemia, unspecified: Secondary | ICD-10-CM | POA: Diagnosis not present

## 2016-02-17 ENCOUNTER — Ambulatory Visit (INDEPENDENT_AMBULATORY_CARE_PROVIDER_SITE_OTHER): Payer: Medicare Other | Admitting: Vascular Surgery

## 2016-02-17 ENCOUNTER — Encounter (INDEPENDENT_AMBULATORY_CARE_PROVIDER_SITE_OTHER): Payer: Medicare Other

## 2016-02-18 DIAGNOSIS — N186 End stage renal disease: Secondary | ICD-10-CM | POA: Diagnosis not present

## 2016-02-18 DIAGNOSIS — N2581 Secondary hyperparathyroidism of renal origin: Secondary | ICD-10-CM | POA: Diagnosis not present

## 2016-02-18 DIAGNOSIS — D631 Anemia in chronic kidney disease: Secondary | ICD-10-CM | POA: Diagnosis not present

## 2016-02-18 DIAGNOSIS — Z992 Dependence on renal dialysis: Secondary | ICD-10-CM | POA: Diagnosis not present

## 2016-02-18 DIAGNOSIS — D509 Iron deficiency anemia, unspecified: Secondary | ICD-10-CM | POA: Diagnosis not present

## 2016-02-20 DIAGNOSIS — Z992 Dependence on renal dialysis: Secondary | ICD-10-CM | POA: Diagnosis not present

## 2016-02-20 DIAGNOSIS — D509 Iron deficiency anemia, unspecified: Secondary | ICD-10-CM | POA: Diagnosis not present

## 2016-02-20 DIAGNOSIS — N2581 Secondary hyperparathyroidism of renal origin: Secondary | ICD-10-CM | POA: Diagnosis not present

## 2016-02-20 DIAGNOSIS — D631 Anemia in chronic kidney disease: Secondary | ICD-10-CM | POA: Diagnosis not present

## 2016-02-20 DIAGNOSIS — N186 End stage renal disease: Secondary | ICD-10-CM | POA: Diagnosis not present

## 2016-02-22 DIAGNOSIS — N2581 Secondary hyperparathyroidism of renal origin: Secondary | ICD-10-CM | POA: Diagnosis not present

## 2016-02-22 DIAGNOSIS — D509 Iron deficiency anemia, unspecified: Secondary | ICD-10-CM | POA: Diagnosis not present

## 2016-02-22 DIAGNOSIS — Z992 Dependence on renal dialysis: Secondary | ICD-10-CM | POA: Diagnosis not present

## 2016-02-22 DIAGNOSIS — N186 End stage renal disease: Secondary | ICD-10-CM | POA: Diagnosis not present

## 2016-02-22 DIAGNOSIS — D631 Anemia in chronic kidney disease: Secondary | ICD-10-CM | POA: Diagnosis not present

## 2016-02-25 DIAGNOSIS — N186 End stage renal disease: Secondary | ICD-10-CM | POA: Diagnosis not present

## 2016-02-25 DIAGNOSIS — D509 Iron deficiency anemia, unspecified: Secondary | ICD-10-CM | POA: Diagnosis not present

## 2016-02-25 DIAGNOSIS — D631 Anemia in chronic kidney disease: Secondary | ICD-10-CM | POA: Diagnosis not present

## 2016-02-25 DIAGNOSIS — Z992 Dependence on renal dialysis: Secondary | ICD-10-CM | POA: Diagnosis not present

## 2016-02-25 DIAGNOSIS — N2581 Secondary hyperparathyroidism of renal origin: Secondary | ICD-10-CM | POA: Diagnosis not present

## 2016-02-26 DIAGNOSIS — N186 End stage renal disease: Secondary | ICD-10-CM | POA: Diagnosis not present

## 2016-02-26 DIAGNOSIS — Z992 Dependence on renal dialysis: Secondary | ICD-10-CM | POA: Diagnosis not present

## 2016-02-27 DIAGNOSIS — D509 Iron deficiency anemia, unspecified: Secondary | ICD-10-CM | POA: Diagnosis not present

## 2016-02-27 DIAGNOSIS — Z992 Dependence on renal dialysis: Secondary | ICD-10-CM | POA: Diagnosis not present

## 2016-02-27 DIAGNOSIS — D631 Anemia in chronic kidney disease: Secondary | ICD-10-CM | POA: Diagnosis not present

## 2016-02-27 DIAGNOSIS — N186 End stage renal disease: Secondary | ICD-10-CM | POA: Diagnosis not present

## 2016-02-29 DIAGNOSIS — D509 Iron deficiency anemia, unspecified: Secondary | ICD-10-CM | POA: Diagnosis not present

## 2016-02-29 DIAGNOSIS — N186 End stage renal disease: Secondary | ICD-10-CM | POA: Diagnosis not present

## 2016-02-29 DIAGNOSIS — Z992 Dependence on renal dialysis: Secondary | ICD-10-CM | POA: Diagnosis not present

## 2016-02-29 DIAGNOSIS — D631 Anemia in chronic kidney disease: Secondary | ICD-10-CM | POA: Diagnosis not present

## 2016-03-02 ENCOUNTER — Ambulatory Visit: Payer: Medicare Other | Admitting: Family Medicine

## 2016-03-03 DIAGNOSIS — Z992 Dependence on renal dialysis: Secondary | ICD-10-CM | POA: Diagnosis not present

## 2016-03-03 DIAGNOSIS — D509 Iron deficiency anemia, unspecified: Secondary | ICD-10-CM | POA: Diagnosis not present

## 2016-03-03 DIAGNOSIS — N186 End stage renal disease: Secondary | ICD-10-CM | POA: Diagnosis not present

## 2016-03-03 DIAGNOSIS — D631 Anemia in chronic kidney disease: Secondary | ICD-10-CM | POA: Diagnosis not present

## 2016-03-05 DIAGNOSIS — D509 Iron deficiency anemia, unspecified: Secondary | ICD-10-CM | POA: Diagnosis not present

## 2016-03-05 DIAGNOSIS — N186 End stage renal disease: Secondary | ICD-10-CM | POA: Diagnosis not present

## 2016-03-05 DIAGNOSIS — Z992 Dependence on renal dialysis: Secondary | ICD-10-CM | POA: Diagnosis not present

## 2016-03-05 DIAGNOSIS — D631 Anemia in chronic kidney disease: Secondary | ICD-10-CM | POA: Diagnosis not present

## 2016-03-07 DIAGNOSIS — D509 Iron deficiency anemia, unspecified: Secondary | ICD-10-CM | POA: Diagnosis not present

## 2016-03-07 DIAGNOSIS — D631 Anemia in chronic kidney disease: Secondary | ICD-10-CM | POA: Diagnosis not present

## 2016-03-07 DIAGNOSIS — N186 End stage renal disease: Secondary | ICD-10-CM | POA: Diagnosis not present

## 2016-03-07 DIAGNOSIS — Z992 Dependence on renal dialysis: Secondary | ICD-10-CM | POA: Diagnosis not present

## 2016-03-10 DIAGNOSIS — Z992 Dependence on renal dialysis: Secondary | ICD-10-CM | POA: Diagnosis not present

## 2016-03-10 DIAGNOSIS — N186 End stage renal disease: Secondary | ICD-10-CM | POA: Diagnosis not present

## 2016-03-10 DIAGNOSIS — D509 Iron deficiency anemia, unspecified: Secondary | ICD-10-CM | POA: Diagnosis not present

## 2016-03-10 DIAGNOSIS — D631 Anemia in chronic kidney disease: Secondary | ICD-10-CM | POA: Diagnosis not present

## 2016-03-12 DIAGNOSIS — D509 Iron deficiency anemia, unspecified: Secondary | ICD-10-CM | POA: Diagnosis not present

## 2016-03-12 DIAGNOSIS — D631 Anemia in chronic kidney disease: Secondary | ICD-10-CM | POA: Diagnosis not present

## 2016-03-12 DIAGNOSIS — Z992 Dependence on renal dialysis: Secondary | ICD-10-CM | POA: Diagnosis not present

## 2016-03-12 DIAGNOSIS — N186 End stage renal disease: Secondary | ICD-10-CM | POA: Diagnosis not present

## 2016-03-14 DIAGNOSIS — Z992 Dependence on renal dialysis: Secondary | ICD-10-CM | POA: Diagnosis not present

## 2016-03-14 DIAGNOSIS — N186 End stage renal disease: Secondary | ICD-10-CM | POA: Diagnosis not present

## 2016-03-14 DIAGNOSIS — D631 Anemia in chronic kidney disease: Secondary | ICD-10-CM | POA: Diagnosis not present

## 2016-03-14 DIAGNOSIS — D509 Iron deficiency anemia, unspecified: Secondary | ICD-10-CM | POA: Diagnosis not present

## 2016-03-17 DIAGNOSIS — N186 End stage renal disease: Secondary | ICD-10-CM | POA: Diagnosis not present

## 2016-03-17 DIAGNOSIS — D631 Anemia in chronic kidney disease: Secondary | ICD-10-CM | POA: Diagnosis not present

## 2016-03-17 DIAGNOSIS — Z992 Dependence on renal dialysis: Secondary | ICD-10-CM | POA: Diagnosis not present

## 2016-03-17 DIAGNOSIS — D509 Iron deficiency anemia, unspecified: Secondary | ICD-10-CM | POA: Diagnosis not present

## 2016-03-19 DIAGNOSIS — Z992 Dependence on renal dialysis: Secondary | ICD-10-CM | POA: Diagnosis not present

## 2016-03-19 DIAGNOSIS — N186 End stage renal disease: Secondary | ICD-10-CM | POA: Diagnosis not present

## 2016-03-19 DIAGNOSIS — D509 Iron deficiency anemia, unspecified: Secondary | ICD-10-CM | POA: Diagnosis not present

## 2016-03-19 DIAGNOSIS — D631 Anemia in chronic kidney disease: Secondary | ICD-10-CM | POA: Diagnosis not present

## 2016-03-20 DIAGNOSIS — R262 Difficulty in walking, not elsewhere classified: Secondary | ICD-10-CM | POA: Diagnosis not present

## 2016-03-20 DIAGNOSIS — M6281 Muscle weakness (generalized): Secondary | ICD-10-CM | POA: Diagnosis not present

## 2016-03-21 DIAGNOSIS — D631 Anemia in chronic kidney disease: Secondary | ICD-10-CM | POA: Diagnosis not present

## 2016-03-21 DIAGNOSIS — N186 End stage renal disease: Secondary | ICD-10-CM | POA: Diagnosis not present

## 2016-03-21 DIAGNOSIS — Z992 Dependence on renal dialysis: Secondary | ICD-10-CM | POA: Diagnosis not present

## 2016-03-21 DIAGNOSIS — D509 Iron deficiency anemia, unspecified: Secondary | ICD-10-CM | POA: Diagnosis not present

## 2016-03-23 DIAGNOSIS — M6281 Muscle weakness (generalized): Secondary | ICD-10-CM | POA: Diagnosis not present

## 2016-03-23 DIAGNOSIS — R262 Difficulty in walking, not elsewhere classified: Secondary | ICD-10-CM | POA: Diagnosis not present

## 2016-03-24 DIAGNOSIS — R262 Difficulty in walking, not elsewhere classified: Secondary | ICD-10-CM | POA: Diagnosis not present

## 2016-03-24 DIAGNOSIS — Z992 Dependence on renal dialysis: Secondary | ICD-10-CM | POA: Diagnosis not present

## 2016-03-24 DIAGNOSIS — D631 Anemia in chronic kidney disease: Secondary | ICD-10-CM | POA: Diagnosis not present

## 2016-03-24 DIAGNOSIS — N186 End stage renal disease: Secondary | ICD-10-CM | POA: Diagnosis not present

## 2016-03-24 DIAGNOSIS — M6281 Muscle weakness (generalized): Secondary | ICD-10-CM | POA: Diagnosis not present

## 2016-03-24 DIAGNOSIS — D509 Iron deficiency anemia, unspecified: Secondary | ICD-10-CM | POA: Diagnosis not present

## 2016-03-25 DIAGNOSIS — M6281 Muscle weakness (generalized): Secondary | ICD-10-CM | POA: Diagnosis not present

## 2016-03-25 DIAGNOSIS — R262 Difficulty in walking, not elsewhere classified: Secondary | ICD-10-CM | POA: Diagnosis not present

## 2016-03-25 DIAGNOSIS — Z992 Dependence on renal dialysis: Secondary | ICD-10-CM | POA: Diagnosis not present

## 2016-03-25 DIAGNOSIS — N186 End stage renal disease: Secondary | ICD-10-CM | POA: Diagnosis not present

## 2016-03-26 DIAGNOSIS — R262 Difficulty in walking, not elsewhere classified: Secondary | ICD-10-CM | POA: Diagnosis not present

## 2016-03-26 DIAGNOSIS — Z992 Dependence on renal dialysis: Secondary | ICD-10-CM | POA: Diagnosis not present

## 2016-03-26 DIAGNOSIS — N186 End stage renal disease: Secondary | ICD-10-CM | POA: Diagnosis not present

## 2016-03-26 DIAGNOSIS — D509 Iron deficiency anemia, unspecified: Secondary | ICD-10-CM | POA: Diagnosis not present

## 2016-03-26 DIAGNOSIS — D631 Anemia in chronic kidney disease: Secondary | ICD-10-CM | POA: Diagnosis not present

## 2016-03-26 DIAGNOSIS — M6281 Muscle weakness (generalized): Secondary | ICD-10-CM | POA: Diagnosis not present

## 2016-03-27 ENCOUNTER — Other Ambulatory Visit (INDEPENDENT_AMBULATORY_CARE_PROVIDER_SITE_OTHER): Payer: Self-pay | Admitting: Vascular Surgery

## 2016-03-27 ENCOUNTER — Other Ambulatory Visit (INDEPENDENT_AMBULATORY_CARE_PROVIDER_SITE_OTHER): Payer: Medicare Other

## 2016-03-27 ENCOUNTER — Encounter (INDEPENDENT_AMBULATORY_CARE_PROVIDER_SITE_OTHER): Payer: Medicare Other | Admitting: Vascular Surgery

## 2016-03-27 DIAGNOSIS — R262 Difficulty in walking, not elsewhere classified: Secondary | ICD-10-CM | POA: Diagnosis not present

## 2016-03-27 DIAGNOSIS — N185 Chronic kidney disease, stage 5: Secondary | ICD-10-CM

## 2016-03-27 DIAGNOSIS — H8109 Meniere's disease, unspecified ear: Secondary | ICD-10-CM | POA: Diagnosis not present

## 2016-03-27 DIAGNOSIS — M6281 Muscle weakness (generalized): Secondary | ICD-10-CM | POA: Diagnosis not present

## 2016-03-27 DIAGNOSIS — R5383 Other fatigue: Secondary | ICD-10-CM | POA: Insufficient documentation

## 2016-03-27 DIAGNOSIS — I5022 Chronic systolic (congestive) heart failure: Secondary | ICD-10-CM | POA: Diagnosis not present

## 2016-03-28 DIAGNOSIS — Z992 Dependence on renal dialysis: Secondary | ICD-10-CM | POA: Diagnosis not present

## 2016-03-28 DIAGNOSIS — D631 Anemia in chronic kidney disease: Secondary | ICD-10-CM | POA: Diagnosis not present

## 2016-03-28 DIAGNOSIS — N186 End stage renal disease: Secondary | ICD-10-CM | POA: Diagnosis not present

## 2016-03-28 DIAGNOSIS — D509 Iron deficiency anemia, unspecified: Secondary | ICD-10-CM | POA: Diagnosis not present

## 2016-03-30 ENCOUNTER — Telehealth: Payer: Self-pay | Admitting: Family Medicine

## 2016-03-30 DIAGNOSIS — M6281 Muscle weakness (generalized): Secondary | ICD-10-CM | POA: Diagnosis not present

## 2016-03-30 DIAGNOSIS — R262 Difficulty in walking, not elsewhere classified: Secondary | ICD-10-CM | POA: Diagnosis not present

## 2016-03-30 NOTE — Telephone Encounter (Signed)
Patient is at [facility], please send to [place]

## 2016-03-30 NOTE — Telephone Encounter (Signed)
Ally from [facility] called and is requesting a refill on pt's folic acid-vitamin b complex-vitamin c-selenium-zinc (DIALYVITE) 3 MG TABS tablet. Please advise, thank you!  Pharmacy - [address] - [place], [place] [address]

## 2016-03-31 DIAGNOSIS — M6281 Muscle weakness (generalized): Secondary | ICD-10-CM | POA: Diagnosis not present

## 2016-03-31 DIAGNOSIS — R262 Difficulty in walking, not elsewhere classified: Secondary | ICD-10-CM | POA: Diagnosis not present

## 2016-03-31 DIAGNOSIS — D509 Iron deficiency anemia, unspecified: Secondary | ICD-10-CM | POA: Diagnosis not present

## 2016-03-31 DIAGNOSIS — N186 End stage renal disease: Secondary | ICD-10-CM | POA: Diagnosis not present

## 2016-03-31 DIAGNOSIS — D631 Anemia in chronic kidney disease: Secondary | ICD-10-CM | POA: Diagnosis not present

## 2016-03-31 DIAGNOSIS — Z992 Dependence on renal dialysis: Secondary | ICD-10-CM | POA: Diagnosis not present

## 2016-03-31 MED ORDER — DIALYVITE 3000 3 MG PO TABS: 1.0000 | ORAL_TABLET | Freq: Every day | ORAL | 5 refills | Status: DC

## 2016-03-31 NOTE — Telephone Encounter (Signed)
Sent to pharmacy 

## 2016-04-01 DIAGNOSIS — R262 Difficulty in walking, not elsewhere classified: Secondary | ICD-10-CM | POA: Diagnosis not present

## 2016-04-01 DIAGNOSIS — M6281 Muscle weakness (generalized): Secondary | ICD-10-CM | POA: Diagnosis not present

## 2016-04-02 ENCOUNTER — Emergency Department: Payer: Medicare Other

## 2016-04-02 ENCOUNTER — Emergency Department
Admission: EM | Admit: 2016-04-02 | Discharge: 2016-04-02 | Disposition: A | Discharge status: Home / Self Care | Payer: Medicare Other | Attending: Emergency Medicine | Admitting: Emergency Medicine

## 2016-04-02 ENCOUNTER — Telehealth: Payer: Self-pay | Admitting: *Deleted

## 2016-04-02 DIAGNOSIS — W19XXXA Unspecified fall, initial encounter: Secondary | ICD-10-CM

## 2016-04-02 DIAGNOSIS — I5022 Chronic systolic (congestive) heart failure: Secondary | ICD-10-CM | POA: Diagnosis not present

## 2016-04-02 DIAGNOSIS — Z79899 Other long term (current) drug therapy: Secondary | ICD-10-CM | POA: Insufficient documentation

## 2016-04-02 DIAGNOSIS — R262 Difficulty in walking, not elsewhere classified: Secondary | ICD-10-CM | POA: Diagnosis not present

## 2016-04-02 DIAGNOSIS — N185 Chronic kidney disease, stage 5: Secondary | ICD-10-CM | POA: Insufficient documentation

## 2016-04-02 DIAGNOSIS — S0990XA Unspecified injury of head, initial encounter: Secondary | ICD-10-CM

## 2016-04-02 DIAGNOSIS — I132 Hypertensive heart and chronic kidney disease with heart failure and with stage 5 chronic kidney disease, or end stage renal disease: Secondary | ICD-10-CM | POA: Insufficient documentation

## 2016-04-02 DIAGNOSIS — W1839XA Other fall on same level, initial encounter: Secondary | ICD-10-CM | POA: Diagnosis not present

## 2016-04-02 DIAGNOSIS — D509 Iron deficiency anemia, unspecified: Secondary | ICD-10-CM | POA: Diagnosis not present

## 2016-04-02 DIAGNOSIS — M6281 Muscle weakness (generalized): Secondary | ICD-10-CM | POA: Diagnosis not present

## 2016-04-02 DIAGNOSIS — Y939 Activity, unspecified: Secondary | ICD-10-CM | POA: Diagnosis not present

## 2016-04-02 DIAGNOSIS — Z87891 Personal history of nicotine dependence: Secondary | ICD-10-CM | POA: Diagnosis not present

## 2016-04-02 DIAGNOSIS — D631 Anemia in chronic kidney disease: Secondary | ICD-10-CM | POA: Diagnosis not present

## 2016-04-02 DIAGNOSIS — Z992 Dependence on renal dialysis: Secondary | ICD-10-CM | POA: Diagnosis not present

## 2016-04-02 DIAGNOSIS — S51011A Laceration without foreign body of right elbow, initial encounter: Secondary | ICD-10-CM | POA: Diagnosis not present

## 2016-04-02 DIAGNOSIS — Y9289 Other specified places as the place of occurrence of the external cause: Secondary | ICD-10-CM | POA: Diagnosis not present

## 2016-04-02 DIAGNOSIS — Y999 Unspecified external cause status: Secondary | ICD-10-CM | POA: Insufficient documentation

## 2016-04-02 DIAGNOSIS — S0083XA Contusion of other part of head, initial encounter: Secondary | ICD-10-CM | POA: Diagnosis not present

## 2016-04-02 DIAGNOSIS — T148XXA Other injury of unspecified body region, initial encounter: Secondary | ICD-10-CM

## 2016-04-02 DIAGNOSIS — I2510 Atherosclerotic heart disease of native coronary artery without angina pectoris: Secondary | ICD-10-CM | POA: Diagnosis not present

## 2016-04-02 DIAGNOSIS — S0181XA Laceration without foreign body of other part of head, initial encounter: Secondary | ICD-10-CM | POA: Insufficient documentation

## 2016-04-02 DIAGNOSIS — S6991XA Unspecified injury of right wrist, hand and finger(s), initial encounter: Secondary | ICD-10-CM | POA: Diagnosis not present

## 2016-04-02 DIAGNOSIS — M25531 Pain in right wrist: Secondary | ICD-10-CM | POA: Diagnosis not present

## 2016-04-02 DIAGNOSIS — I252 Old myocardial infarction: Secondary | ICD-10-CM | POA: Insufficient documentation

## 2016-04-02 DIAGNOSIS — N186 End stage renal disease: Secondary | ICD-10-CM | POA: Diagnosis not present

## 2016-04-02 DIAGNOSIS — S199XXA Unspecified injury of neck, initial encounter: Secondary | ICD-10-CM | POA: Diagnosis not present

## 2016-04-02 MED ORDER — ACETAMINOPHEN 325 MG PO TABS
650.0000 mg | ORAL_TABLET | Freq: Once | ORAL | Status: AC
  Administered 2016-04-02: 650 mg via ORAL
  Filled 2016-04-02: qty 2

## 2016-04-02 NOTE — Telephone Encounter (Signed)
Please advise 

## 2016-04-02 NOTE — Telephone Encounter (Signed)
Pt's bother  was advised to have Dr. Caryl Bis write a order so that pt can have a independent medical evaluation. Patient is currently a resident at [facility]. Pt is requesting to home, however her health is declining and her mental state is dementia per brother.  Please contact Percell Miller at [phone] for questions And Drue Novel ( Education officer, museum at [facility]) [phone]

## 2016-04-02 NOTE — Discharge Instructions (Signed)
1. You may take Tylenol as needed for pain. 2. Medical tape will follow off in 7-10 days. 3. Return to the ER for worsening symptoms, persistent vomiting, difficulty breathing or other concerns.

## 2016-04-02 NOTE — ED Provider Notes (Signed)
[facility] Emergency Department Provider Note   ____________________________________________   First MD Initiated Contact with Patient 04/02/16 [phone]     (approximate)  I have reviewed the triage vital signs and the nursing notes.   HISTORY  Chief Complaint Fall    HPI Sharon [name] is a 83 y.o. female brought to the ED from [facility] with a chief complaint of mechanical fall. Patient reports stumbling and she assisted herself to the floor but scraped her forehead on her walker. History of frequent falls. Denies striking head or LOC. Complains of forehead pain, right wrist and neck pain. Not on anticoagulants. Denies recent fever, chills, chest pain, shortness of breath, abdominal pain, nausea, vomiting, diarrhea. Nothing makes her symptoms better or worse.Tetanus is up-to-date.   Past Medical History:  Diagnosis Date  . CAD (coronary artery disease)   . Cardiomyopathy ([place])   . IBS (irritable bowel syndrome) 2010  . Kidney failure July 2012  . Kidney failure   . Meniere disease   . Meniere's disease   . Myocardial infarction   . OSA (obstructive sleep apnea)   . Peritoneal dialysis status ([place])   . Presence of permanent cardiac pacemaker     Patient Active Problem List   Diagnosis Date Noted  . Pressure injury of skin 12/15/2015  . Closed left subtrochanteric femur fracture ([place]) 12/15/2015  . Femur fracture, left ([place]) 12/14/2015  . Meniere disease   . Loss of weight 10/21/2015  . Skin tear of elbow without complication 56/38/7564  . Clinical depression 06/20/2015  . Combined fat and carbohydrate induced hyperlipemia 06/20/2015  . Dysphagia 05/24/2015  . Dyspnea 05/24/2015  . Imbalance 04/22/2015  . Electrical shock sensation in right posterior head 11/22/2014  . Fall at home 11/15/2014  . Expressive aphasia 10/04/2014  . Breathlessness on exertion 09/13/2014  . Chronic pain syndrome 05/22/2014  . Insomnia 03/13/2014  .  Anxiety state 03/13/2014  . Arteriosclerosis of coronary artery 01/15/2014  . Chronic systolic heart failure ([place]) 01/15/2014  . Obstructive apnea 01/15/2014  . Basal cell carcinoma of neck 11/14/2013  . Degeneration of intervertebral disc of cervical region 11/07/2013  . Cervical radiculitis 10/16/2013  . Pelvic pain in female 09/12/2013  . Neck pain of over 3 months duration 09/12/2013  . Memory loss 09/12/2013  . Systolic heart failure, chronic ([place]) 05/12/2013  . Medicare annual wellness visit, subsequent 11/10/2012  . Sinus node dysfunction ([place]) 08/01/2012  . Low back pain 08/01/2012  . Cervical spine pain 05/02/2012  . Irritable bowel syndrome 11/02/2011  . Depression 04/01/2011  . Hypertension 04/01/2011  . Menopausal disorder 04/01/2011  . Screening for breast cancer 04/01/2011  . Chronic kidney disease, stage 5, kidney failure ([place]) 04/01/2011    Past Surgical History:  Procedure Laterality Date  . [place]  . CATARACT EXTRACTION  2006, 2011  . CHOLECYSTECTOMY  01/2008  . FEMUR IM NAIL Left 12/15/2015   Procedure: INTRAMEDULLARY (IM) RETROGRADE FEMORAL NAILING;  Surgeon: Oletta Cohn, DO;  Location: [facility] ORS;  Service: Orthopedics;  Laterality: Left;  . INSERTION OF DIALYSIS CATHETER  07/2010  . PACEMAKER INSERTION  2006  . PERIPHERAL VASCULAR CATHETERIZATION N/A 12/18/2015   Procedure: Dialysis/Perma Catheter Insertion;  Surgeon: Katha Cabal, MD;  Location: [facility];  Service: Cardiovascular;  Laterality: N/A;  . TOTAL KNEE ARTHROPLASTY  2008   LEFT/Dr [place]    Prior to Admission medications   Medication Sig Start Date End Date Taking?  Authorizing Provider  calcitRIOL (ROCALTROL) 0.25 MCG capsule Take 0.25 mcg by mouth daily.    Historical Provider, MD  diphenoxylate-atropine (LOMOTIL) 2.5-0.025 MG tablet TAKE 1 TABLET BY MOUTH FOUR TIMES DAILY AS NEEDED FOR DIARRHEA OR LOOSE STOOLS 08/30/15   [name], MD  enoxaparin  (LOVENOX) 30 MG/0.3ML injection Inject 0.3 mLs (30 mg total) into the skin daily. 12/17/15   [name], MD  folic acid-vitamin b complex-vitamin c-selenium-zinc (DIALYVITE) 3 MG TABS tablet Take 1 tablet by mouth daily. 03/31/16   [name], MD  levothyroxine (SYNTHROID, LEVOTHROID) 25 MCG tablet Take 1 tablet (25 mcg total) by mouth daily. 10/25/15   [name], MD  meclizine (ANTIVERT) 25 MG tablet Take 1 tablet (25 mg total) by mouth 3 (three) times daily as needed for dizziness. 04/22/15   [name], MD  metoprolol succinate (TOPROL XL) 25 MG 24 hr tablet TAKE 1/2 BY MOUTH DAILY 01/30/16   [name], MD  senna-docusate (SENOKOT-S) 8.6-50 MG tablet Take 1 tablet by mouth 2 (two) times daily. 12/17/15   Srikar Sudini, MD  sertraline (ZOLOFT) 50 MG tablet TAKE 3 TABLETS(150 MG) BY MOUTH DAILY 08/30/15   [name], MD  traMADol (ULTRAM) 50 MG tablet Take 1 tablet (50 mg total) by mouth every 6 (six) hours as needed. 12/17/15   Srikar Sudini, MD  Vitamin D, Ergocalciferol, (DRISDOL) 50000 units CAPS capsule Take 50,000 Units by mouth every 7 (seven) days.  04/17/15   Historical Provider, MD    Allergies Effexor [venlafaxine]; Statins; and Codeine  Family History  Problem Relation Age of Onset  . Cancer Mother     ? ovarian - sarcoma  . Cancer Father     Skin cancer  . Diabetes Sister   . COPD Sister   . Depression Sister   . Cancer Sister     Lung - 26 yrs old    Social History Social History  Substance Use Topics  . Smoking status: Former Smoker    Quit date: 03/31/1948  . Smokeless tobacco: Never Used  . Alcohol use No    Review of Systems  Constitutional: No fever/chills. Eyes: No visual changes. ENT: Positive for for head pain. No sore throat. Cardiovascular: Denies chest pain. Respiratory: Denies shortness of breath. Gastrointestinal: No abdominal pain.  No nausea, no vomiting.  No diarrhea.  No constipation. Genitourinary: Negative for  dysuria. Musculoskeletal: Positive for neck pain. Negative for back pain. Skin: Positive for right arm skin tear. Negative for rash. Neurological: Negative for headaches, focal weakness or numbness.  10-point ROS otherwise negative.  ____________________________________________   PHYSICAL EXAM:  VITAL SIGNS: ED Triage Vitals [04/02/16 0522]  Enc Vitals Group     BP (!) 175/72     Pulse Rate 61     Resp 18     Temp 98.1 F (36.7 C)     Temp Source Oral     SpO2 100 %     Weight 100 lb (45.4 kg)     Height 5\' 4"  (1.626 m)     Head Circumference      Peak Flow      Pain Score 7     Pain Loc      Pain Edu?      Excl. in [place]?     Constitutional: Alert and oriented. Well appearing and in no acute distress. Eyes: Conjunctivae are normal. PERRL. EOMI. Head: Right upper forehead skin tear without active bleeding.  Nose: No  congestion/rhinnorhea. Mouth/Throat: Mucous membranes are moist.  Oropharynx non-erythematous. Neck: No stridor.  No cervical spine tenderness to palpation. Cardiovascular: Normal rate, regular rhythm. Grossly normal heart sounds.  Good peripheral circulation. Respiratory: Normal respiratory effort.  No retractions. Lungs CTAB. Gastrointestinal: Soft and nontender. No distention. No abdominal bruits. No CVA tenderness. Musculoskeletal: Right elbow skin tear. No lower extremity tenderness nor edema.  No joint effusions. Neurologic:  Normal speech and language. No gross focal neurologic deficits are appreciated.  Skin:  Skin is warm, dry and intact. No rash noted. Psychiatric: Mood and affect are normal. Speech and behavior are normal.  ____________________________________________   LABS (all labs ordered are listed, but only abnormal results are displayed)  Labs Reviewed - No data to display ____________________________________________  EKG  None ____________________________________________  RADIOLOGY  CT head and cervical spine interpreted per Dr.  Alroy Dust: 1. No acute intracranial findings. There is moderate generalized  atrophy and chronic appearing white matter hypodensities which  likely represent small vessel ischemic disease.  2. Negative for acute cervical spine fracture   Right wrist x-ray interpreted per Dr. Alroy Dust: Negative for acute fracture. ____________________________________________   PROCEDURES  Procedure(s) performed: None  Procedures  Critical Care performed: No  ____________________________________________   INITIAL IMPRESSION / ASSESSMENT AND PLAN / ED COURSE  Pertinent labs & imaging results that were available during my care of the patient were reviewed by me and considered in my medical decision making (see chart for details).  83 year old female status post mechanical fall with skin tears on face and right elbow. CT and plain film imaging studies are negative for acute traumatic injury. Strict return precautions given. Patient verbalizes understanding and agrees with plan of care.      ____________________________________________   FINAL CLINICAL IMPRESSION(S) / ED DIAGNOSES  Final diagnoses:  Fall, initial encounter  Contusion of face, initial encounter  Multiple skin tears  Minor head injury, initial encounter      NEW MEDICATIONS STARTED DURING THIS VISIT:  New Prescriptions   No medications on file     Note:  This document was prepared using Dragon voice recognition software and may include unintentional dictation errors.    Paulette Blanch, MD 04/04/16 [phone]

## 2016-04-02 NOTE — ED Triage Notes (Signed)
Pt had mechanical fall at [facility] assisted [facility].  Pt states she assisted herself to floor, but believes that she scraped her head on her walker.  Pt denies LOC.  Pt is A&Ox4.  Pt has hx of frequent falls.  Pt c/o R wrist pain, neck pain, and head pain at this time.  Pt has scrape to R of forehead and skin tear to R elbow.  Pt brought in by [facility].  Report received from [facility], EMT-P.

## 2016-04-02 NOTE — ED Notes (Signed)
Bandage put on patient's head per EDP request.

## 2016-04-03 ENCOUNTER — Ambulatory Visit: Payer: Medicare Other | Admitting: Family Medicine

## 2016-04-03 DIAGNOSIS — M6281 Muscle weakness (generalized): Secondary | ICD-10-CM | POA: Diagnosis not present

## 2016-04-03 DIAGNOSIS — R262 Difficulty in walking, not elsewhere classified: Secondary | ICD-10-CM | POA: Diagnosis not present

## 2016-04-03 NOTE — Telephone Encounter (Signed)
What kind of independent medical evaluation today talking about? Please check with the social worker and the patient's brother. Thanks.

## 2016-04-04 DIAGNOSIS — D631 Anemia in chronic kidney disease: Secondary | ICD-10-CM | POA: Diagnosis not present

## 2016-04-04 DIAGNOSIS — Z992 Dependence on renal dialysis: Secondary | ICD-10-CM | POA: Diagnosis not present

## 2016-04-04 DIAGNOSIS — D509 Iron deficiency anemia, unspecified: Secondary | ICD-10-CM | POA: Diagnosis not present

## 2016-04-04 DIAGNOSIS — N186 End stage renal disease: Secondary | ICD-10-CM | POA: Diagnosis not present

## 2016-04-06 ENCOUNTER — Ambulatory Visit: Payer: Medicare Other | Admitting: Family Medicine

## 2016-04-06 DIAGNOSIS — R262 Difficulty in walking, not elsewhere classified: Secondary | ICD-10-CM | POA: Diagnosis not present

## 2016-04-06 DIAGNOSIS — M6281 Muscle weakness (generalized): Secondary | ICD-10-CM | POA: Diagnosis not present

## 2016-04-06 NOTE — Telephone Encounter (Signed)
You can schedule her with me. Does she require an independent evaluation by someone else as well?

## 2016-04-06 NOTE — Telephone Encounter (Signed)
Social worker states her mental status has declined and patients brother would like to be able to make all decisions for patients because she is no longer able to do so herself. Patient needs a eval for dementia.Patient has increased falls, confusions, memory loss. Patient thinks its just her age.Fax: [phone] [place]

## 2016-04-07 DIAGNOSIS — N186 End stage renal disease: Secondary | ICD-10-CM | POA: Diagnosis not present

## 2016-04-07 DIAGNOSIS — R262 Difficulty in walking, not elsewhere classified: Secondary | ICD-10-CM | POA: Diagnosis not present

## 2016-04-07 DIAGNOSIS — E039 Hypothyroidism, unspecified: Secondary | ICD-10-CM | POA: Diagnosis not present

## 2016-04-07 DIAGNOSIS — I1311 Hypertensive heart and chronic kidney disease without heart failure, with stage 5 chronic kidney disease, or end stage renal disease: Secondary | ICD-10-CM | POA: Diagnosis not present

## 2016-04-07 DIAGNOSIS — D631 Anemia in chronic kidney disease: Secondary | ICD-10-CM | POA: Diagnosis not present

## 2016-04-07 DIAGNOSIS — Z992 Dependence on renal dialysis: Secondary | ICD-10-CM | POA: Diagnosis not present

## 2016-04-07 DIAGNOSIS — D509 Iron deficiency anemia, unspecified: Secondary | ICD-10-CM | POA: Diagnosis not present

## 2016-04-07 DIAGNOSIS — M6281 Muscle weakness (generalized): Secondary | ICD-10-CM | POA: Diagnosis not present

## 2016-04-07 NOTE — Telephone Encounter (Signed)
Tried calling ED but did not have vm set up, called social worker to notify that we need an Ov, she will try to contact patients brother

## 2016-04-08 ENCOUNTER — Telehealth (INDEPENDENT_AMBULATORY_CARE_PROVIDER_SITE_OTHER): Payer: Self-pay

## 2016-04-08 DIAGNOSIS — R262 Difficulty in walking, not elsewhere classified: Secondary | ICD-10-CM | POA: Diagnosis not present

## 2016-04-08 DIAGNOSIS — M6281 Muscle weakness (generalized): Secondary | ICD-10-CM | POA: Diagnosis not present

## 2016-04-08 NOTE — Telephone Encounter (Signed)
Spoke with the POA and the patient will be seen on 04/10/16 for vein mapping and we will schedule her surgery at that time.

## 2016-04-09 ENCOUNTER — Telehealth: Payer: Self-pay | Admitting: *Deleted

## 2016-04-09 DIAGNOSIS — N186 End stage renal disease: Secondary | ICD-10-CM | POA: Diagnosis not present

## 2016-04-09 DIAGNOSIS — D509 Iron deficiency anemia, unspecified: Secondary | ICD-10-CM | POA: Diagnosis not present

## 2016-04-09 DIAGNOSIS — M6281 Muscle weakness (generalized): Secondary | ICD-10-CM | POA: Diagnosis not present

## 2016-04-09 DIAGNOSIS — D631 Anemia in chronic kidney disease: Secondary | ICD-10-CM | POA: Diagnosis not present

## 2016-04-09 DIAGNOSIS — R262 Difficulty in walking, not elsewhere classified: Secondary | ICD-10-CM | POA: Diagnosis not present

## 2016-04-09 DIAGNOSIS — Z992 Dependence on renal dialysis: Secondary | ICD-10-CM | POA: Diagnosis not present

## 2016-04-09 NOTE — Telephone Encounter (Signed)
Last time this was sen tin was 04/17/15 is she supposed to be on this?

## 2016-04-09 NOTE — Telephone Encounter (Signed)
[facility] has requested clarity on Vitamin D Ergocalciferol  Contact [phone] Ref [id number]

## 2016-04-10 ENCOUNTER — Ambulatory Visit (INDEPENDENT_AMBULATORY_CARE_PROVIDER_SITE_OTHER): Payer: Medicare Other | Admitting: Vascular Surgery

## 2016-04-10 ENCOUNTER — Encounter (INDEPENDENT_AMBULATORY_CARE_PROVIDER_SITE_OTHER): Payer: Self-pay | Admitting: Vascular Surgery

## 2016-04-10 ENCOUNTER — Encounter
Admission: RE | Admit: 2016-04-10 | Discharge: 2016-04-10 | Disposition: A | Discharge status: Home / Self Care | Payer: Medicare Other | Source: Ambulatory Visit | Attending: Vascular Surgery | Admitting: Vascular Surgery

## 2016-04-10 ENCOUNTER — Encounter (INDEPENDENT_AMBULATORY_CARE_PROVIDER_SITE_OTHER): Payer: Self-pay

## 2016-04-10 ENCOUNTER — Other Ambulatory Visit (INDEPENDENT_AMBULATORY_CARE_PROVIDER_SITE_OTHER): Payer: Self-pay | Admitting: Vascular Surgery

## 2016-04-10 VITALS — BP 117/64 | HR 60 | Resp 16 | Ht 64.0 in | Wt 100.0 lb

## 2016-04-10 DIAGNOSIS — W19XXXS Unspecified fall, sequela: Secondary | ICD-10-CM

## 2016-04-10 DIAGNOSIS — Z992 Dependence on renal dialysis: Secondary | ICD-10-CM | POA: Diagnosis not present

## 2016-04-10 DIAGNOSIS — Z0183 Encounter for blood typing: Secondary | ICD-10-CM | POA: Insufficient documentation

## 2016-04-10 DIAGNOSIS — Y92099 Unspecified place in other non-institutional residence as the place of occurrence of the external cause: Secondary | ICD-10-CM

## 2016-04-10 DIAGNOSIS — I10 Essential (primary) hypertension: Secondary | ICD-10-CM | POA: Diagnosis not present

## 2016-04-10 DIAGNOSIS — Z01812 Encounter for preprocedural laboratory examination: Secondary | ICD-10-CM | POA: Diagnosis not present

## 2016-04-10 DIAGNOSIS — Y92009 Unspecified place in unspecified non-institutional (private) residence as the place of occurrence of the external cause: Secondary | ICD-10-CM

## 2016-04-10 DIAGNOSIS — N186 End stage renal disease: Secondary | ICD-10-CM | POA: Insufficient documentation

## 2016-04-10 HISTORY — DX: Malignant (primary) neoplasm, unspecified: C80.1

## 2016-04-10 HISTORY — DX: Unspecified osteoarthritis, unspecified site: M19.90

## 2016-04-10 HISTORY — DX: Major depressive disorder, single episode, unspecified: F32.9

## 2016-04-10 HISTORY — DX: Depression, unspecified: F32.A

## 2016-04-10 LAB — CBC WITH DIFFERENTIAL/PLATELET
BASOS ABS: 0.1 10*3/uL (ref 0–0.1)
Basophils Relative: 2 %
Eosinophils Absolute: 0.2 10*3/uL (ref 0–0.7)
Eosinophils Relative: 6 %
HEMATOCRIT: 38.8 % (ref 35.0–47.0)
Hemoglobin: 12.6 g/dL (ref 12.0–16.0)
LYMPHS ABS: 1 10*3/uL (ref 1.0–3.6)
LYMPHS PCT: 25 %
MCH: 28 pg (ref 26.0–34.0)
MCHC: 32.5 g/dL (ref 32.0–36.0)
MCV: 86.1 fL (ref 80.0–100.0)
MONO ABS: 0.4 10*3/uL (ref 0.2–0.9)
Monocytes Relative: 9 %
NEUTROS ABS: 2.4 10*3/uL (ref 1.4–6.5)
Neutrophils Relative %: 58 %
Platelets: 223 10*3/uL (ref 150–440)
RBC: 4.51 MIL/uL (ref 3.80–5.20)
RDW: 16.8 % — AB (ref 11.5–14.5)
WBC: 4 10*3/uL (ref 3.6–11.0)

## 2016-04-10 LAB — TYPE AND SCREEN
ABO/RH(D): B POS
Antibody Screen: NEGATIVE

## 2016-04-10 LAB — BASIC METABOLIC PANEL
ANION GAP: 10 (ref 5–15)
BUN: 31 mg/dL — AB (ref 6–20)
CO2: 28 mmol/L (ref 22–32)
Calcium: 9.5 mg/dL (ref 8.9–10.3)
Chloride: 96 mmol/L — ABNORMAL LOW (ref 101–111)
Creatinine, Ser: 2.19 mg/dL — ABNORMAL HIGH (ref 0.44–1.00)
GFR calc Af Amer: 22 mL/min — ABNORMAL LOW (ref >60)
GFR, EST NON AFRICAN AMERICAN: 19 mL/min — AB (ref >60)
GLUCOSE: 149 mg/dL — AB (ref 65–99)
POTASSIUM: 3.9 mmol/L (ref 3.5–5.1)
Sodium: 134 mmol/L — ABNORMAL LOW (ref 135–145)

## 2016-04-10 LAB — PROTIME-INR
INR: 1.14
PROTHROMBIN TIME: 14.7 s (ref 11.4–15.2)

## 2016-04-10 LAB — APTT: APTT: 39 s — AB (ref 24–36)

## 2016-04-10 NOTE — Pre-Procedure Instructions (Signed)
Faxed instructions to [facility] to give patient Synthroid, Toprol and Zoloft AM of surgery with a sip of water.

## 2016-04-10 NOTE — Assessment & Plan Note (Signed)
blood pressure control important in reducing the progression of atherosclerotic disease. On appropriate oral medications.  

## 2016-04-10 NOTE — Assessment & Plan Note (Signed)
The patient has end-stage renal disease and needs to transition from peritoneal dialysis to hemodialysis. She is currently using a PermCath. She needs a permanent dialysis access, and clinically appears to have adequate anatomy for right radiocephalic AV fistula creation. Risks and benefits of the procedure were discussed in detail with the patient. She is agreeable to proceed.

## 2016-04-10 NOTE — Telephone Encounter (Signed)
I have no evidence that the patient needs this currently. We will refuse this refill. Thanks.

## 2016-04-10 NOTE — Patient Instructions (Signed)
AV Fistula Placement, Care After Refer to this sheet in the next few weeks. These instructions provide you with information about caring for yourself after your procedure. Your health care provider may also give you more specific instructions. Your treatment has been planned according to current medical practices, but problems sometimes occur. Call your health care provider if you have any problems or questions after your procedure. What can I expect after the procedure? After your procedure, it is common to:  Feel sore.  Have numbness.  Feel cold.  Feel a vibration (thrill) over the fistula. Follow these instructions at home:   Incision care   Do not take baths or showers, swim, or use a hot tub until your health care provider approves.  Keep the area around your cut from surgery (incision) clean and dry.  Follow instructions from your health care provider about how to take care of your incision. Make sure you:  Wash your hands with soap and water before you change your bandage (dressing). If soap and water are not available, use hand sanitizer.  Change your dressing as told by your health care provider.  Leave stitches (sutures) and clips in place. They may need to stay in place for 2 weeks or longer. Fistula Care   Check your fistula site every day to make sure the thrill feels the same.  Check your fistula site every day for signs of infection. Watch for:  Redness.  Swelling.  Discharge.  Tenderness.  Enlargement.  Keep your arm raised (elevated) while you rest.  Do not lift anything that is heavier than a gallon of milk with the arm that has the fistula.  Do not lie down on your fistula arm.  Do not let anyone draw blood or take a blood pressure reading on your fistula arm.  Do not wear tight jewelry or clothing over your fistula arm. General instructions   Rest at home for a day or two.  Gradually start doing your usual activities again. Ask your surgeon  when you can return to work or school.  Take over-the-counter and prescription medicines only as told by your health care provider.  Keep all follow-up visits as told by your health care provider. This is important. Contact a health care provider if:  You have chills or a fever.  You have pain at your fistula site that is not going away.  You have numbness or coldness at your fistula site that is not going away.  You feel a decrease or a change in the thrill.  You have swelling in your arm or hand.  You have redness, swelling, discharge, tenderness, or enlargement at your fistula site. Get help right away if:  You are bleeding from your fistula site.  You have chest pain.  You have trouble breathing. This information is not intended to replace advice given to you by your health care provider. Make sure you discuss any questions you have with your health care provider. Document Released: 01/12/2005 Document Revised: 05/22/2015 Document Reviewed: 04/04/2014 Elsevier Interactive Patient Education  2017 [employer]  

## 2016-04-10 NOTE — Telephone Encounter (Signed)
noted 

## 2016-04-10 NOTE — Progress Notes (Signed)
MRN : [id number]  Sharon [name] is a 83 y.o. (1926/10/01) female who presents with chief complaint of  Chief Complaint  Patient presents with  . New Patient (Initial Visit)  .  History of Present Illness: Patient returns today in follow up of Renal failure and need for AV fistula placement. Although her previous vein mapping showed marginal vein throughout both upper extremities, she clearly has a large and usable cephalic vein in the right forearm with a strong radial pulse. Her arterial study showed smaller radial and ulnar arteries but were triphasic. She is disappointed that she cannot continue to do peritoneal dialysis but understands why she needs a fistula. She does look forward to getting her PermCath out.  Current Outpatient Prescriptions  Medication Sig Dispense Refill  . calcitRIOL (ROCALTROL) 0.25 MCG capsule Take 0.25 mcg by mouth daily.    . diphenoxylate-atropine (LOMOTIL) 2.5-0.025 MG tablet TAKE 1 TABLET BY MOUTH FOUR TIMES DAILY AS NEEDED FOR DIARRHEA OR LOOSE STOOLS 30 tablet 5  . enoxaparin (LOVENOX) 30 MG/0.3ML injection Inject 0.3 mLs (30 mg total) into the skin daily. 12 Syringe 0  . folic acid-vitamin b complex-vitamin c-selenium-zinc (DIALYVITE) 3 MG TABS tablet Take 1 tablet by mouth daily. 30 tablet 5  . levothyroxine (SYNTHROID, LEVOTHROID) 25 MCG tablet Take 1 tablet (25 mcg total) by mouth daily. 90 tablet 1  . meclizine (ANTIVERT) 25 MG tablet Take 1 tablet (25 mg total) by mouth 3 (three) times daily as needed for dizziness. 30 tablet 0  . metoprolol succinate (TOPROL XL) 25 MG 24 hr tablet TAKE 1/2 BY MOUTH DAILY 30 tablet 11  . senna-docusate (SENOKOT-S) 8.6-50 MG tablet Take 1 tablet by mouth 2 (two) times daily.    . sertraline (ZOLOFT) 50 MG tablet TAKE 3 TABLETS(150 MG) BY MOUTH DAILY 90 tablet 2  . traMADol (ULTRAM) 50 MG tablet Take 1 tablet (50 mg total) by mouth every 6 (six) hours as needed. 20 tablet 0  . Vitamin D, Ergocalciferol, (DRISDOL)  50000 units CAPS capsule Take 50,000 Units by mouth every 7 (seven) days.      No current facility-administered medications for this visit.     Past Medical History:  Diagnosis Date  . CAD (coronary artery disease)   . Cardiomyopathy ([place])   . IBS (irritable bowel syndrome) 2010  . Kidney failure July 2012  . Kidney failure   . Meniere disease   . Meniere's disease   . Myocardial infarction   . OSA (obstructive sleep apnea)   . Peritoneal dialysis status ([place])   . Presence of permanent cardiac pacemaker     Past Surgical History:  Procedure Laterality Date  . [place]  . CATARACT EXTRACTION  2006, 2011  . CHOLECYSTECTOMY  01/2008  . FEMUR IM NAIL Left 12/15/2015   Procedure: INTRAMEDULLARY (IM) RETROGRADE FEMORAL NAILING;  Surgeon: Oletta Cohn, DO;  Location: [facility] ORS;  Service: Orthopedics;  Laterality: Left;  . INSERTION OF DIALYSIS CATHETER  07/2010  . PACEMAKER INSERTION  2006  . PERIPHERAL VASCULAR CATHETERIZATION N/A 12/18/2015   Procedure: Dialysis/Perma Catheter Insertion;  Surgeon: Katha Cabal, MD;  Location: [facility];  Service: Cardiovascular;  Laterality: N/A;  . TOTAL KNEE ARTHROPLASTY  2008   LEFT/Dr [place]    Social History Social History  Substance Use Topics  . Smoking status: Former Smoker    Quit date: 03/31/1948  . Smokeless tobacco: Never Used  . Alcohol use No  Family History Family History  Problem Relation Age of Onset  . Cancer Mother     ? ovarian - sarcoma  . Cancer Father     Skin cancer  . Diabetes Sister   . COPD Sister   . Depression Sister   . Cancer Sister     Lung - 54 yrs old    Allergies  Allergen Reactions  . Effexor [Venlafaxine]   . Statins Other (See Comments)    Muscle weakness  . Codeine Other (See Comments)    GI UPSET     REVIEW OF SYSTEMS (Negative unless checked)  Constitutional: [] Weight loss  [] Fever  [] Chills Cardiac: [] Chest pain   [] Chest pressure    [] Palpitations   [] Shortness of breath when laying flat   [] Shortness of breath at rest   [] Shortness of breath with exertion. Vascular:  [] Pain in legs with walking   [] Pain in legs at rest   [] Pain in legs when laying flat   [] Claudication   [] Pain in feet when walking  [] Pain in feet at rest  [] Pain in feet when laying flat   [] History of DVT   [] Phlebitis   [] Swelling in legs   [] Varicose veins   [] Non-healing ulcers Pulmonary:   [] Uses home oxygen   [] Productive cough   [] Hemoptysis   [] Wheeze  [] COPD   [] Asthma Neurologic:  [x] Dizziness  [] Blackouts   [] Seizures   [] History of stroke   [] History of TIA  [] Aphasia   [] Temporary blindness   [] Dysphagia   [] Weakness or numbness in arms   [] Weakness or numbness in legs Musculoskeletal:  [] Arthritis   [] Joint swelling   [] Joint pain   [] Low back pain Hematologic:  [x] Easy bruising  [] Easy bleeding   [] Hypercoagulable state   [] Anemic   Gastrointestinal:  [] Blood in stool   [] Vomiting blood  [] Gastroesophageal reflux/heartburn   [] Abdominal pain Genitourinary:  [x] Chronic kidney disease   [] Difficult urination  [] Frequent urination  [] Burning with urination   [] Hematuria Skin:  [] Rashes   [] Ulcers   [] Wounds Psychological:  [] History of anxiety   []  History of major depression.  Physical Examination  BP 117/64   Pulse 60   Resp 16   Ht 5\' 4"  (1.626 m)   Wt 100 lb (45.4 kg)   BMI 17.16 kg/m  Gen:  WD/WN, NAD Head: Bruising on the right face and head area from her falls, No temporalis wasting. Ear/Nose/Throat: Hearing grossly intact, nares w/o erythema or drainage, trachea midline Eyes: Conjunctiva clear. Sclera non-icteric Neck: Supple.  No JVD.  Pulmonary:  Good air movement, no use of accessory muscles.  Cardiac: Irregular Vascular: PermCath in place in the right chest. Large visible cephalic vein in the right forearm as described above Vessel Right Left  Radial Palpable Palpable                                    Gastrointestinal: soft, non-tender/non-distended. No guarding/reflex.  Musculoskeletal: M/S 5/5 throughout.  No deformity or atrophy.Walks with a walker Neurologic: Sensation grossly intact in extremities.  Symmetrical.  Speech is fluent.  Psychiatric: Judgment intact, Mood & affect appropriate for pt's clinical situation. Dermatologic: No rashes or ulcers noted.  No cellulitis or open wounds. Lymph : No Cervical, Axillary, or Inguinal lymphadenopathy.      Labs Recent Results (from the past 2160 hour(s))  Legionella Pneumophila Serogp 1 Ur Ag     Status: None  Collection Time: 01/29/16  3:20 PM  Result Value Ref Range   L. pneumophila Serogp 1 Ur Ag Negative Negative    Comment: (NOTE) Presumptive negative for L. pneumophila serogroup 1 antigen in urine, suggesting no recent or current infection. Legionnaires' disease cannot be ruled out since other serogroups and species may also cause disease. Performed At: [facility] [place], [place] [id number] Lindon Romp MD QI:[id number]    Source of Sample URINE, RANDOM     Radiology Dg Wrist Complete Right  Result Date: 04/02/2016 CLINICAL DATA:  Ground level fall at home tonight EXAM: RIGHT WRIST - COMPLETE 3+ VIEW COMPARISON:  None. FINDINGS: Negative for acute fracture or dislocation. There is chondrocalcinosis at the triangular fibrocartilage. There is moderately severe osteoarthritis at the first carpometacarpal joint. IMPRESSION: Negative for acute fracture. Electronically Signed   By: Andreas Newport M.D.   On: 04/02/2016 05:38   Ct Head Wo Contrast  Result Date: 04/02/2016 CLINICAL DATA:  Ground level fall at home tonight EXAM: CT HEAD WITHOUT CONTRAST CT CERVICAL SPINE WITHOUT CONTRAST TECHNIQUE: Multidetector CT imaging of the head and cervical spine was performed following the standard protocol without intravenous contrast. Multiplanar CT image reconstructions of the cervical spine were also  generated. COMPARISON:  01/14/2016 FINDINGS: CT HEAD FINDINGS Brain: There is no intracranial hemorrhage, mass or evidence of acute infarction. There is moderate generalized atrophy. There is moderate chronic microvascular ischemic change. There is no significant extra-axial fluid collection. No acute intracranial findings are evident. Vascular: No hyperdense vessel or unexpected calcification. Skull: Normal. Negative for fracture or focal lesion. Sinuses/Orbits: No acute finding. Other: Right frontal scalp laceration and swelling. CT CERVICAL SPINE FINDINGS Alignment: Mild degenerative spondylolisthesis at C4-5. No traumatic malalignment. Skull base and vertebrae: No acute fracture. No primary bone lesion or focal pathologic process. Soft tissues and spinal canal: No prevertebral fluid or swelling. No visible canal hematoma. Disc levels: Moderate degenerative cervical disc disease at C5-6 and C6-7. The facet articulations are arthritic but intact. Upper chest: Negative Other: None IMPRESSION: 1. No acute intracranial findings. There is moderate generalized atrophy and chronic appearing white matter hypodensities which likely represent small vessel ischemic disease. 2. Negative for acute cervical spine fracture Electronically Signed   By: Andreas Newport M.D.   On: 04/02/2016 05:49   Ct Cervical Spine Wo Contrast  Result Date: 04/02/2016 CLINICAL DATA:  Ground level fall at home tonight EXAM: CT HEAD WITHOUT CONTRAST CT CERVICAL SPINE WITHOUT CONTRAST TECHNIQUE: Multidetector CT imaging of the head and cervical spine was performed following the standard protocol without intravenous contrast. Multiplanar CT image reconstructions of the cervical spine were also generated. COMPARISON:  01/14/2016 FINDINGS: CT HEAD FINDINGS Brain: There is no intracranial hemorrhage, mass or evidence of acute infarction. There is moderate generalized atrophy. There is moderate chronic microvascular ischemic change. There is no  significant extra-axial fluid collection. No acute intracranial findings are evident. Vascular: No hyperdense vessel or unexpected calcification. Skull: Normal. Negative for fracture or focal lesion. Sinuses/Orbits: No acute finding. Other: Right frontal scalp laceration and swelling. CT CERVICAL SPINE FINDINGS Alignment: Mild degenerative spondylolisthesis at C4-5. No traumatic malalignment. Skull base and vertebrae: No acute fracture. No primary bone lesion or focal pathologic process. Soft tissues and spinal canal: No prevertebral fluid or swelling. No visible canal hematoma. Disc levels: Moderate degenerative cervical disc disease at C5-6 and C6-7. The facet articulations are arthritic but intact. Upper chest: Negative Other: None IMPRESSION: 1. No acute intracranial findings. There is  moderate generalized atrophy and chronic appearing white matter hypodensities which likely represent small vessel ischemic disease. 2. Negative for acute cervical spine fracture Electronically Signed   By: Andreas Newport M.D.   On: 04/02/2016 05:49     Assessment/Plan  Fall at home Very bruised.  Hypertension blood pressure control important in reducing the progression of atherosclerotic disease. On appropriate oral medications.   ESRD on dialysis [facility]) The patient has end-stage renal disease and needs to transition from peritoneal dialysis to hemodialysis. She is currently using a PermCath. She needs a permanent dialysis access, and clinically appears to have adequate anatomy for right radiocephalic AV fistula creation. Risks and benefits of the procedure were discussed in detail with the patient. She is agreeable to proceed.    Leotis Pain, MD  04/10/2016 9:54 AM    This note was created with Dragon medical transcription system.  Any errors from dictation are purely unintentional

## 2016-04-10 NOTE — Patient Instructions (Addendum)
  Your procedure is scheduled on: Wed. 04/15/16 Report to Day Surgery. To find out your arrival time please call [phone] between 1PM - 3PM on Tues. 04/14/16.  Remember: Instructions that are not followed completely may result in serious medical risk, up to and including death, or upon the discretion of your surgeon and anesthesiologist your surgery may need to be rescheduled.    _x___ 1. Do not eat food or drink liquids after midnight. No gum chewing or hard candies.     ____ 2. No Alcohol for 24 hours before or after surgery.   ____ 3. Do Not Smoke For 24 Hours Prior to Your Surgery.   __x__ 4. Bring list of medication and what she took the am of surgery and times   _x___ 5. Notify your doctor if there is any change in your medical condition     (cold, fever, infections).       Do not wear jewelry, make-up, hairpins, clips or nail polish.  Do not wear lotions, powders, or perfumes. You may wear deodorant.  Do not shave 48 hours prior to surgery. Men may shave face and neck.  Do not bring valuables to the hospital.    [facility] is not responsible for any belongings or valuables.               Contacts, dentures or bridgework may not be worn into surgery.  Wear Hearing Aids to Hospital.  Will be removed for surgery  Leave your suitcase in the car. After surgery it may be brought to your room.  For patients admitted to the hospital, discharge time is determined by your                treatment team.   Patients discharged the day of surgery will not be allowed to drive home.   Please read over the following fact sheets that you were given:      __x__ Take these medicines the morning of surgery with A SIP OF WATER:    1. Reflux Meds  2. Morning Psych Meds  3. Thyroid medication  4. metoprolol succinate (TOPROL XL) 25 MG 24 hr tablet  ( if taken in the AM)  5.  6.  ____ Fleet Enema (as directed)   _x___ Use CHG Sage wipes as directed  ____ Use inhalers on the day of  surgery  ____ Stop metformin 2 days prior to surgery    ____ Take 1/2 of usual insulin dose the night before surgery and none on the morning of surgery.   ____ Stop Coumadin/Plavix/aspirin on  __x__ Stop Anti-inflammatories on today ( If taking ibuprofen aleve or other NSAIDS)   ____ Stop supplements until after surgery.    ____ Bring C-Pap to the hospital. ( Does not use right now)

## 2016-04-10 NOTE — Assessment & Plan Note (Signed)
Very bruised.

## 2016-04-11 DIAGNOSIS — D631 Anemia in chronic kidney disease: Secondary | ICD-10-CM | POA: Diagnosis not present

## 2016-04-11 DIAGNOSIS — Z992 Dependence on renal dialysis: Secondary | ICD-10-CM | POA: Diagnosis not present

## 2016-04-11 DIAGNOSIS — N186 End stage renal disease: Secondary | ICD-10-CM | POA: Diagnosis not present

## 2016-04-11 DIAGNOSIS — D509 Iron deficiency anemia, unspecified: Secondary | ICD-10-CM | POA: Diagnosis not present

## 2016-04-14 DIAGNOSIS — F39 Unspecified mood [affective] disorder: Secondary | ICD-10-CM | POA: Diagnosis not present

## 2016-04-14 DIAGNOSIS — F339 Major depressive disorder, recurrent, unspecified: Secondary | ICD-10-CM | POA: Diagnosis not present

## 2016-04-14 DIAGNOSIS — D631 Anemia in chronic kidney disease: Secondary | ICD-10-CM | POA: Diagnosis not present

## 2016-04-14 DIAGNOSIS — Z992 Dependence on renal dialysis: Secondary | ICD-10-CM | POA: Diagnosis not present

## 2016-04-14 DIAGNOSIS — D509 Iron deficiency anemia, unspecified: Secondary | ICD-10-CM | POA: Diagnosis not present

## 2016-04-14 DIAGNOSIS — G4700 Insomnia, unspecified: Secondary | ICD-10-CM | POA: Diagnosis not present

## 2016-04-14 DIAGNOSIS — I509 Heart failure, unspecified: Secondary | ICD-10-CM | POA: Diagnosis not present

## 2016-04-14 DIAGNOSIS — N186 End stage renal disease: Secondary | ICD-10-CM | POA: Diagnosis not present

## 2016-04-14 DIAGNOSIS — G3184 Mild cognitive impairment, so stated: Secondary | ICD-10-CM | POA: Diagnosis not present

## 2016-04-14 MED ORDER — CEFAZOLIN SODIUM-DEXTROSE 2-4 GM/100ML-% IV SOLN
2.0000 g | INTRAVENOUS | Status: AC
  Administered 2016-04-15: 2 g via INTRAVENOUS

## 2016-04-15 ENCOUNTER — Ambulatory Visit: Payer: Medicare Other | Admitting: Certified Registered"

## 2016-04-15 ENCOUNTER — Encounter
Admission: RE | Disposition: A | Discharge status: Home / Self Care | Payer: Self-pay | Source: Ambulatory Visit | Attending: Vascular Surgery

## 2016-04-15 ENCOUNTER — Ambulatory Visit
Admission: RE | Admit: 2016-04-15 | Discharge: 2016-04-15 | Disposition: A | Discharge status: Home / Self Care | Payer: Medicare Other | Source: Ambulatory Visit | Attending: Vascular Surgery | Admitting: Vascular Surgery

## 2016-04-15 DIAGNOSIS — Z4902 Encounter for fitting and adjustment of peritoneal dialysis catheter: Secondary | ICD-10-CM | POA: Diagnosis not present

## 2016-04-15 DIAGNOSIS — N186 End stage renal disease: Secondary | ICD-10-CM | POA: Insufficient documentation

## 2016-04-15 DIAGNOSIS — I429 Cardiomyopathy, unspecified: Secondary | ICD-10-CM | POA: Diagnosis not present

## 2016-04-15 DIAGNOSIS — Z7901 Long term (current) use of anticoagulants: Secondary | ICD-10-CM | POA: Insufficient documentation

## 2016-04-15 DIAGNOSIS — F329 Major depressive disorder, single episode, unspecified: Secondary | ICD-10-CM | POA: Diagnosis not present

## 2016-04-15 DIAGNOSIS — I120 Hypertensive chronic kidney disease with stage 5 chronic kidney disease or end stage renal disease: Secondary | ICD-10-CM | POA: Diagnosis not present

## 2016-04-15 DIAGNOSIS — I2510 Atherosclerotic heart disease of native coronary artery without angina pectoris: Secondary | ICD-10-CM | POA: Diagnosis not present

## 2016-04-15 DIAGNOSIS — Z87891 Personal history of nicotine dependence: Secondary | ICD-10-CM | POA: Insufficient documentation

## 2016-04-15 DIAGNOSIS — Z96652 Presence of left artificial knee joint: Secondary | ICD-10-CM | POA: Insufficient documentation

## 2016-04-15 DIAGNOSIS — G4733 Obstructive sleep apnea (adult) (pediatric): Secondary | ICD-10-CM | POA: Diagnosis not present

## 2016-04-15 DIAGNOSIS — Z79899 Other long term (current) drug therapy: Secondary | ICD-10-CM | POA: Insufficient documentation

## 2016-04-15 DIAGNOSIS — K589 Irritable bowel syndrome without diarrhea: Secondary | ICD-10-CM | POA: Insufficient documentation

## 2016-04-15 DIAGNOSIS — Z950 Presence of cardiac pacemaker: Secondary | ICD-10-CM | POA: Diagnosis not present

## 2016-04-15 DIAGNOSIS — G4730 Sleep apnea, unspecified: Secondary | ICD-10-CM | POA: Diagnosis not present

## 2016-04-15 DIAGNOSIS — I252 Old myocardial infarction: Secondary | ICD-10-CM | POA: Diagnosis not present

## 2016-04-15 DIAGNOSIS — F419 Anxiety disorder, unspecified: Secondary | ICD-10-CM | POA: Insufficient documentation

## 2016-04-15 HISTORY — PX: MINOR REMOVAL OF PERITONEAL DIALYSIS CATHETER: SHX6397

## 2016-04-15 HISTORY — PX: AV FISTULA PLACEMENT: SHX1204

## 2016-04-15 LAB — POCT I-STAT 4, (NA,K, GLUC, HGB,HCT)
GLUCOSE: 90 mg/dL (ref 65–99)
HCT: 40 % (ref 36.0–46.0)
Hemoglobin: 13.6 g/dL (ref 12.0–15.0)
POTASSIUM: 3.7 mmol/L (ref 3.5–5.1)
Sodium: 136 mmol/L (ref 135–145)

## 2016-04-15 SURGERY — ARTERIOVENOUS (AV) FISTULA CREATION
Anesthesia: General | Laterality: Right | Wound class: Clean

## 2016-04-15 MED ORDER — SODIUM CHLORIDE 0.9 % IV SOLN: INTRAVENOUS | Status: DC

## 2016-04-15 MED ORDER — ONDANSETRON HCL 4 MG/2ML IJ SOLN
INTRAMUSCULAR | Status: DC | PRN
  Administered 2016-04-15: 4 mg via INTRAVENOUS

## 2016-04-15 MED ORDER — FENTANYL CITRATE (PF) 100 MCG/2ML IJ SOLN: 25.0000 ug | INTRAMUSCULAR | Status: DC | PRN

## 2016-04-15 MED ORDER — FAMOTIDINE 20 MG PO TABS: 20.0000 mg | ORAL_TABLET | Freq: Once | ORAL | Status: DC

## 2016-04-15 MED ORDER — ONDANSETRON HCL 4 MG/2ML IJ SOLN: 4.0000 mg | Freq: Once | INTRAMUSCULAR | Status: DC | PRN

## 2016-04-15 MED ORDER — TRAMADOL HCL 50 MG PO TABS
ORAL_TABLET | ORAL | Status: AC
  Filled 2016-04-15: qty 1

## 2016-04-15 MED ORDER — SODIUM CHLORIDE 0.9 % IJ SOLN
INTRAMUSCULAR | Status: DC | PRN
  Administered 2016-04-15: 1 mL via TOPICAL

## 2016-04-15 MED ORDER — TRAMADOL HCL 50 MG PO TABS
50.0000 mg | ORAL_TABLET | Freq: Four times a day (QID) | ORAL | Status: DC | PRN
  Administered 2016-04-15: 50 mg via ORAL

## 2016-04-15 MED ORDER — CHLORHEXIDINE GLUCONATE CLOTH 2 % EX PADS: 6.0000 | MEDICATED_PAD | Freq: Once | CUTANEOUS | Status: DC

## 2016-04-15 MED ORDER — HEPARIN SODIUM (PORCINE) 5000 UNIT/ML IJ SOLN
INTRAMUSCULAR | Status: AC
  Filled 2016-04-15: qty 1

## 2016-04-15 MED ORDER — PROPOFOL 10 MG/ML IV BOLUS
INTRAVENOUS | Status: DC | PRN
  Administered 2016-04-15: 80 mg via INTRAVENOUS
  Administered 2016-04-15: 20 mg via INTRAVENOUS

## 2016-04-15 MED ORDER — LIDOCAINE HCL (PF) 1 % IJ SOLN
INTRAMUSCULAR | Status: AC
  Filled 2016-04-15: qty 2

## 2016-04-15 MED ORDER — HEPARIN SODIUM (PORCINE) 1000 UNIT/ML IJ SOLN
INTRAMUSCULAR | Status: DC | PRN
  Administered 2016-04-15: 3000 [IU] via INTRAVENOUS

## 2016-04-15 MED ORDER — BACITRACIN ZINC 500 UNIT/GM EX OINT
TOPICAL_OINTMENT | CUTANEOUS | Status: AC
Start: 2016-04-15 — End: 2016-04-15
  Filled 2016-04-15: qty 28.35

## 2016-04-15 MED ORDER — FENTANYL CITRATE (PF) 100 MCG/2ML IJ SOLN
INTRAMUSCULAR | Status: DC | PRN
  Administered 2016-04-15 (×3): 25 ug via INTRAVENOUS

## 2016-04-15 MED ORDER — CEFAZOLIN SODIUM-DEXTROSE 2-4 GM/100ML-% IV SOLN
INTRAVENOUS | Status: AC
  Administered 2016-04-15: 2 g via INTRAVENOUS
  Filled 2016-04-15: qty 100

## 2016-04-15 MED ORDER — SODIUM CHLORIDE 0.9 % IV SOLN
INTRAVENOUS | Status: DC | PRN
  Administered 2016-04-15: 13:00:00 via INTRAVENOUS

## 2016-04-15 MED ORDER — FENTANYL CITRATE (PF) 100 MCG/2ML IJ SOLN
INTRAMUSCULAR | Status: AC
  Filled 2016-04-15: qty 2

## 2016-04-15 MED ORDER — BUPIVACAINE-EPINEPHRINE (PF) 0.5% -1:200000 IJ SOLN
INTRAMUSCULAR | Status: AC
  Filled 2016-04-15: qty 30

## 2016-04-15 MED ORDER — PAPAVERINE HCL 30 MG/ML IJ SOLN
INTRAMUSCULAR | Status: AC
  Filled 2016-04-15: qty 2

## 2016-04-15 MED ORDER — TRAMADOL HCL 50 MG PO TABS: 50.0000 mg | ORAL_TABLET | Freq: Four times a day (QID) | ORAL | 0 refills | Status: DC | PRN

## 2016-04-15 MED ORDER — FAMOTIDINE 20 MG PO TABS
ORAL_TABLET | ORAL | Status: AC
  Administered 2016-04-15: 20 mg
  Filled 2016-04-15: qty 1

## 2016-04-15 SURGICAL SUPPLY — 50 items
BAG DECANTER FOR FLEXI CONT (MISCELLANEOUS) ×4 IMPLANT
BLADE SURG SZ11 CARB STEEL (BLADE) ×4 IMPLANT
BOOT SUTURE AID YELLOW STND (SUTURE) ×4 IMPLANT
BRUSH SCRUB 4% CHG (MISCELLANEOUS) ×4 IMPLANT
CANISTER SUCT 1200ML W/VALVE (MISCELLANEOUS) ×4 IMPLANT
CHLORAPREP W/TINT 26ML (MISCELLANEOUS) ×4 IMPLANT
CLIP SPRNG 6MM S-JAW DBL (CLIP) ×4
DERMABOND ADVANCED (GAUZE/BANDAGES/DRESSINGS) ×2
DERMABOND ADVANCED .7 DNX12 (GAUZE/BANDAGES/DRESSINGS) ×2 IMPLANT
ELECT CAUTERY BLADE 6.4 (BLADE) ×4 IMPLANT
ELECT REM PT RETURN 9FT ADLT (ELECTROSURGICAL) ×4
ELECTRODE REM PT RTRN 9FT ADLT (ELECTROSURGICAL) ×2 IMPLANT
GEL ULTRASOUND 20GR AQUASONIC (MISCELLANEOUS) IMPLANT
GLOVE BIO SURGEON STRL SZ7 (GLOVE) ×8 IMPLANT
GLOVE INDICATOR 7.5 STRL GRN (GLOVE) ×4 IMPLANT
GOWN STRL REUS W/ TWL LRG LVL3 (GOWN DISPOSABLE) ×2 IMPLANT
GOWN STRL REUS W/ TWL XL LVL3 (GOWN DISPOSABLE) ×2 IMPLANT
GOWN STRL REUS W/TWL LRG LVL3 (GOWN DISPOSABLE) ×2
GOWN STRL REUS W/TWL XL LVL3 (GOWN DISPOSABLE) ×2
HEMOSTAT SURGICEL 2X3 (HEMOSTASIS) ×4 IMPLANT
IV NS 500ML (IV SOLUTION) ×2
IV NS 500ML BAXH (IV SOLUTION) ×2 IMPLANT
KIT RM TURNOVER STRD PROC AR (KITS) ×4 IMPLANT
LABEL OR SOLS (LABEL) ×4 IMPLANT
LOOP RED MAXI  1X406MM (MISCELLANEOUS) ×2
LOOP VESSEL MAXI 1X406 RED (MISCELLANEOUS) ×2 IMPLANT
LOOP VESSEL MINI 0.8X406 BLUE (MISCELLANEOUS) ×2 IMPLANT
LOOPS BLUE MINI 0.8X406MM (MISCELLANEOUS) ×2
NEEDLE FILTER BLUNT 18X 1/2SAF (NEEDLE) ×2
NEEDLE FILTER BLUNT 18X1 1/2 (NEEDLE) ×2 IMPLANT
NEEDLE HYPO 30X.5 LL (NEEDLE) IMPLANT
NS IRRIG 500ML POUR BTL (IV SOLUTION) ×4 IMPLANT
PACK EXTREMITY ARMC (MISCELLANEOUS) ×4 IMPLANT
PAD PREP 24X41 OB/GYN DISP (PERSONAL CARE ITEMS) IMPLANT
SOLUTION CELL SAVER (CLIP) ×2 IMPLANT
STOCKINETTE STRL 4IN 9604848 (GAUZE/BANDAGES/DRESSINGS) ×4 IMPLANT
SUT MNCRL AB 4-0 PS2 18 (SUTURE) ×4 IMPLANT
SUT PROLENE 6 0 BV (SUTURE) ×16 IMPLANT
SUT SILK 2 0 (SUTURE) ×2
SUT SILK 2-0 18XBRD TIE 12 (SUTURE) ×2 IMPLANT
SUT SILK 3 0 (SUTURE) ×2
SUT SILK 3-0 18XBRD TIE 12 (SUTURE) ×2 IMPLANT
SUT SILK 4 0 (SUTURE) ×2
SUT SILK 4-0 18XBRD TIE 12 (SUTURE) ×2 IMPLANT
SUT VIC AB 3-0 SH 27 (SUTURE) ×4
SUT VIC AB 3-0 SH 27X BRD (SUTURE) ×4 IMPLANT
SYR 20CC LL (SYRINGE) ×4 IMPLANT
SYR 3ML LL SCALE MARK (SYRINGE) IMPLANT
SYR TB 1ML 27GX1/2 LL (SYRINGE) IMPLANT
TOWEL OR 17X26 4PK STRL BLUE (TOWEL DISPOSABLE) IMPLANT

## 2016-04-15 NOTE — Discharge Instructions (Signed)

## 2016-04-15 NOTE — Anesthesia Preprocedure Evaluation (Signed)
Anesthesia Evaluation  Patient identified by MRN, date of birth, ID band  Reviewed: Allergy & Precautions, NPO status , Patient's Chart, lab work & pertinent test results  Airway Mallampati: II       Dental  (+) Teeth Intact   Pulmonary sleep apnea , former smoker,     + decreased breath sounds      Cardiovascular Exercise Tolerance: Poor hypertension, Pt. on home beta blockers + Past MI  + pacemaker  Rhythm:Regular Rate:Normal     Neuro/Psych Anxiety Depression    GI/Hepatic negative GI ROS,   Endo/Other  negative endocrine ROS  Renal/GU DialysisRenal disease     Musculoskeletal   Abdominal Normal abdominal exam  (+)   Peds  Hematology   Anesthesia Other Findings   Reproductive/Obstetrics                             Anesthesia Physical Anesthesia Plan  ASA: III  Anesthesia Plan: General   Post-op Pain Management:    Induction: Intravenous  Airway Management Planned: LMA  Additional Equipment:   Intra-op Plan:   Post-operative Plan: Extubation in OR  Informed Consent: I have reviewed the patients History and Physical, chart, labs and discussed the procedure including the risks, benefits and alternatives for the proposed anesthesia with the patient or authorized representative who has indicated his/her understanding and acceptance.     Plan Discussed with: CRNA  Anesthesia Plan Comments:         Anesthesia Quick Evaluation

## 2016-04-15 NOTE — OR Nursing (Signed)
Peritoneal dialysis catheter removed from patient's abdomen. No longer using it.

## 2016-04-15 NOTE — Anesthesia Procedure Notes (Signed)
Procedure Name: LMA Insertion Performed by: Lance Muss Pre-anesthesia Checklist: Patient identified, Patient being monitored, Timeout performed, Emergency Drugs available and Suction available Patient Re-evaluated:Patient Re-evaluated prior to inductionOxygen Delivery Method: Circle system utilized Preoxygenation: Pre-oxygenation with 100% oxygen Intubation Type: IV induction Ventilation: Mask ventilation without difficulty LMA: LMA inserted LMA Size: 3.0 Tube type: Oral Number of attempts: 1 Placement Confirmation: positive ETCO2 and breath sounds checked- equal and bilateral Tube secured with: Tape Dental Injury: Teeth and Oropharynx as per pre-operative assessment

## 2016-04-15 NOTE — Op Note (Signed)
[facility]   OPERATIVE NOTE   PROCEDURE: Right radiocephaic arteriovenous fistula placement  PRE-OPERATIVE DIAGNOSIS: 1. ESRD   POST-OPERATIVE DIAGNOSIS: Same  SURGEON: Leotis Pain, MD  ASSISTANT(S): none  ANESTHESIA: general  ESTIMATED BLOOD LOSS: 10 cc  FINDING(S): Adequate cephalic vein and radial artery  SPECIMEN(S):  None  INDICATIONS:   Sharon [name] is a 83 y.o. female who presents with ESRD now switching to HD permanently from PD.  The patient is scheduled for right radiocephalic arteriovenous fistula placement when non-invasive studies suggested adequate anatomy for fistula creation at this location.  The patient is aware the risks include but are not limited to: bleeding, infection, steal syndrome, nerve damage, ischemic monomelic neuropathy, failure to mature, and need for additional procedures.  The patient is aware of the risks of the procedure and elects to proceed forward.  DESCRIPTION: After full informed written consent was obtained from the patient, the patient was brought back to the operating room and placed supine upon the operating table.  Prior to induction, the patient received IV antibiotics.   After obtaining adequate anesthesia, the patient was then prepped and draped in the standard fashion for a right arm access procedure.  I turned my attention first to identifying the patient's distal cephalic vein and radial artery.  I made an incision at the level of the distal forearm and wrist and dissected through the subcutaneous tissue and fascia to gain exposure of the radial artery.  This was noted to be normal sized and mildly calcific and useable for fistula creation.  This was dissected out proximally and distally and controlled with vessel loops.  I then dissected out the cephalic vein.  This was noted to be patent and adequate size for fistula creation. I then gave the patient 3000 units of intravenous heparin.  The distal segment of  the vein was ligated with a  2-0 silk, and the vein was transected.  I then instilled the heparinized saline into the vein and clamped it.  At this point, I reset my exposure of the radial artery and placed the artery under tension proximally and distally.  I made an arteriotomy with a #11 blade, and then I extended the arteriotomy with a Potts scissor.  I injected heparinized saline proximal and distal to this arteriotomy.  The vein was then sewn to the artery in an end-to-side configuration with a running stitch of 6-0 Prolene.  Prior to completing this anastomosis, I allowed the vein and artery to backbleed.  There was no evidence of clot from any vessels.  I completed the anastomosis in the usual fashion and then released all vessel loops and clamps.  There was a palpable  thrill in the venous outflow, and there was a palpable radial pulse beyond the anastomosis.  At this point, I irrigated out the surgical wound. Surgicel was placed. There was no further active bleeding.  The subcutaneous tissue was reapproximated with a running stitch of 3-0 Vicryl.  The skin was then reapproximated with a running subcuticular stitch of 4-0 Monocryl.  The skin was then cleaned, dried, and reinforced with Dermabond.  The patient tolerated this procedure well and was taken to the recovery room in stable condition  COMPLICATIONS: None  CONDITION: Stable   Leotis Pain, MD 04/15/2016 2:25 PM   This note was created with Dragon Medical transcription system. Any errors in dictation are purely unintentional.

## 2016-04-15 NOTE — Transfer of Care (Signed)
Immediate Anesthesia Transfer of Care Note  Patient: Sharon [name]  Procedure(s) Performed: Procedure(s): ARTERIOVENOUS (AV) FISTULA CREATION ( RADIOCEPHALIC ) STAGE 2 (Right) MINOR REMOVAL OF PERITONEAL DIALYSIS CATHETER  Patient Location: PACU  Anesthesia Type:General  Level of Consciousness: sedated and responds to stimulation  Airway & Oxygen Therapy: Patient Spontanous Breathing and Patient connected to face mask oxygen  Post-op Assessment: Report given to RN and Post -op Vital signs reviewed and stable  Post vital signs: Reviewed and stable  Last Vitals:  Vitals:   04/15/16 1445  BP: (!) 120/58  Pulse: 60  Resp: 10    Last Pain: There were no vitals filed for this visit.       Complications: No apparent anesthesia complications

## 2016-04-15 NOTE — H&P (Signed)
[facility] History & Physical Update  The patient was interviewed and re-examined.  The patient's previous History and Physical has been reviewed and is unchanged.  The patient desires to have her PD catheter removed as well. We plan to proceed with the scheduled procedure of a right radiocephalic AVF and will add a PD catheter removal as well.  Leotis Pain, MD  04/15/2016, 11:18 AM

## 2016-04-15 NOTE — Anesthesia Post-op Follow-up Note (Cosign Needed)
Anesthesia QCDR form completed.        

## 2016-04-16 ENCOUNTER — Encounter: Payer: Self-pay | Admitting: Vascular Surgery

## 2016-04-16 DIAGNOSIS — D509 Iron deficiency anemia, unspecified: Secondary | ICD-10-CM | POA: Diagnosis not present

## 2016-04-16 DIAGNOSIS — Z992 Dependence on renal dialysis: Secondary | ICD-10-CM | POA: Diagnosis not present

## 2016-04-16 DIAGNOSIS — D631 Anemia in chronic kidney disease: Secondary | ICD-10-CM | POA: Diagnosis not present

## 2016-04-16 DIAGNOSIS — N186 End stage renal disease: Secondary | ICD-10-CM | POA: Diagnosis not present

## 2016-04-16 NOTE — Op Note (Signed)
[facility]   OPERATIVE NOTE  DATE: 04/15/2016  PRE-OPERATIVE DIAGNOSIS: ESRD, switching to HD  POST-OPERATIVE DIAGNOSIS: same as above  PROCEDURE: 1.   Removal of peritoneal dialysis catheter  SURGEON: Leotis Pain, MD  ASSISTANT(S): none  ANESTHESIA: general  ESTIMATED BLOOD LOSS: minimal  FINDING(S): 1.  none  SPECIMEN(S):  none  INDICATIONS:   Patient is a 83 y.o.female with ESRD and is switching to HD.  The peritoneal catheter needs to be removed.  Risks and benefits are discussed.  DESCRIPTION: After obtaining full informed written consent, the patient was brought back to the operating room and placed supine upon the operating table.  After obtaining adequate anesthesia, the patient was prepped and draped in the standard fashion. Given the patient's small size, both cuffs could be dissected free by opening the exit site incision slightly and dissecting the cuffs free.  The superficial cuff was removed and gentle traction ws used to dissect down the to deep cuff.  This was freed from the scar tissue at the abdominal wall and removed.  The catheter was then removed with gentle traction in its entirety.  A 2-0 Vicryl was used to close the deep cuff exit site and.  A dry dressing was placed at the previous catheter exit site.  The patient was then awakened from anesthesia and taken to the recovery room in stable condition.  COMPLICATIONS: None  CONDITION: Stable   Leotis Pain 04/16/2016 3:18 PM

## 2016-04-18 DIAGNOSIS — N186 End stage renal disease: Secondary | ICD-10-CM | POA: Diagnosis not present

## 2016-04-18 DIAGNOSIS — D509 Iron deficiency anemia, unspecified: Secondary | ICD-10-CM | POA: Diagnosis not present

## 2016-04-18 DIAGNOSIS — D631 Anemia in chronic kidney disease: Secondary | ICD-10-CM | POA: Diagnosis not present

## 2016-04-18 DIAGNOSIS — Z992 Dependence on renal dialysis: Secondary | ICD-10-CM | POA: Diagnosis not present

## 2016-04-21 DIAGNOSIS — D509 Iron deficiency anemia, unspecified: Secondary | ICD-10-CM | POA: Diagnosis not present

## 2016-04-21 DIAGNOSIS — Z992 Dependence on renal dialysis: Secondary | ICD-10-CM | POA: Diagnosis not present

## 2016-04-21 DIAGNOSIS — N186 End stage renal disease: Secondary | ICD-10-CM | POA: Diagnosis not present

## 2016-04-21 DIAGNOSIS — D631 Anemia in chronic kidney disease: Secondary | ICD-10-CM | POA: Diagnosis not present

## 2016-04-21 NOTE — Anesthesia Postprocedure Evaluation (Signed)
Anesthesia Post Note  Patient: Sharon [name]  Procedure(s) Performed: Procedure(s) (LRB): ARTERIOVENOUS (AV) FISTULA CREATION ( RADIOCEPHALIC ) STAGE 2 (Right) MINOR REMOVAL OF PERITONEAL DIALYSIS CATHETER  Patient location during evaluation: PACU Anesthesia Type: General Level of consciousness: awake Pain management: pain level controlled Vital Signs Assessment: post-procedure vital signs reviewed and stable Respiratory status: spontaneous breathing Cardiovascular status: stable Anesthetic complications: no     Last Vitals:  Vitals:   04/15/16 1611 04/15/16 1648  BP: (!) 136/56 117/61  Pulse: 60 71  Resp: 14 16  Temp: 36.2 C     Last Pain:  Vitals:   04/16/16 0915  TempSrc:   PainSc: 2                  VAN STAVEREN,[name]

## 2016-04-23 DIAGNOSIS — R51 Headache: Secondary | ICD-10-CM | POA: Diagnosis not present

## 2016-04-23 DIAGNOSIS — D509 Iron deficiency anemia, unspecified: Secondary | ICD-10-CM | POA: Diagnosis not present

## 2016-04-23 DIAGNOSIS — W06XXXA Fall from bed, initial encounter: Secondary | ICD-10-CM | POA: Diagnosis not present

## 2016-04-23 DIAGNOSIS — N186 End stage renal disease: Secondary | ICD-10-CM | POA: Diagnosis not present

## 2016-04-23 DIAGNOSIS — D631 Anemia in chronic kidney disease: Secondary | ICD-10-CM | POA: Diagnosis not present

## 2016-04-23 DIAGNOSIS — S0993XA Unspecified injury of face, initial encounter: Secondary | ICD-10-CM | POA: Diagnosis not present

## 2016-04-23 DIAGNOSIS — M25551 Pain in right hip: Secondary | ICD-10-CM | POA: Diagnosis not present

## 2016-04-23 DIAGNOSIS — Z992 Dependence on renal dialysis: Secondary | ICD-10-CM | POA: Diagnosis not present

## 2016-04-24 ENCOUNTER — Emergency Department: Payer: Medicare Other

## 2016-04-24 ENCOUNTER — Emergency Department
Admission: EM | Admit: 2016-04-24 | Discharge: 2016-04-24 | Disposition: A | Discharge status: Home / Self Care | Payer: Medicare Other | Attending: Emergency Medicine | Admitting: Emergency Medicine

## 2016-04-24 ENCOUNTER — Encounter: Payer: Self-pay | Admitting: Emergency Medicine

## 2016-04-24 DIAGNOSIS — G4733 Obstructive sleep apnea (adult) (pediatric): Secondary | ICD-10-CM | POA: Diagnosis not present

## 2016-04-24 DIAGNOSIS — W19XXXA Unspecified fall, initial encounter: Secondary | ICD-10-CM

## 2016-04-24 DIAGNOSIS — I132 Hypertensive heart and chronic kidney disease with heart failure and with stage 5 chronic kidney disease, or end stage renal disease: Secondary | ICD-10-CM | POA: Insufficient documentation

## 2016-04-24 DIAGNOSIS — Z87891 Personal history of nicotine dependence: Secondary | ICD-10-CM | POA: Insufficient documentation

## 2016-04-24 DIAGNOSIS — W1800XA Striking against unspecified object with subsequent fall, initial encounter: Secondary | ICD-10-CM | POA: Insufficient documentation

## 2016-04-24 DIAGNOSIS — S72101A Unspecified trochanteric fracture of right femur, initial encounter for closed fracture: Secondary | ICD-10-CM

## 2016-04-24 DIAGNOSIS — S72111A Displaced fracture of greater trochanter of right femur, initial encounter for closed fracture: Secondary | ICD-10-CM | POA: Insufficient documentation

## 2016-04-24 DIAGNOSIS — M84459A Pathological fracture, hip, unspecified, initial encounter for fracture: Secondary | ICD-10-CM | POA: Diagnosis not present

## 2016-04-24 DIAGNOSIS — Z79899 Other long term (current) drug therapy: Secondary | ICD-10-CM | POA: Insufficient documentation

## 2016-04-24 DIAGNOSIS — N185 Chronic kidney disease, stage 5: Secondary | ICD-10-CM | POA: Diagnosis not present

## 2016-04-24 DIAGNOSIS — S51812A Laceration without foreign body of left forearm, initial encounter: Secondary | ICD-10-CM | POA: Insufficient documentation

## 2016-04-24 DIAGNOSIS — N186 End stage renal disease: Secondary | ICD-10-CM | POA: Diagnosis not present

## 2016-04-24 DIAGNOSIS — F325 Major depressive disorder, single episode, in full remission: Secondary | ICD-10-CM | POA: Diagnosis not present

## 2016-04-24 DIAGNOSIS — I5022 Chronic systolic (congestive) heart failure: Secondary | ICD-10-CM | POA: Diagnosis not present

## 2016-04-24 DIAGNOSIS — Z992 Dependence on renal dialysis: Secondary | ICD-10-CM | POA: Diagnosis not present

## 2016-04-24 DIAGNOSIS — Z85828 Personal history of other malignant neoplasm of skin: Secondary | ICD-10-CM | POA: Diagnosis not present

## 2016-04-24 DIAGNOSIS — M25551 Pain in right hip: Secondary | ICD-10-CM | POA: Diagnosis not present

## 2016-04-24 DIAGNOSIS — Y9389 Activity, other specified: Secondary | ICD-10-CM | POA: Insufficient documentation

## 2016-04-24 DIAGNOSIS — I2510 Atherosclerotic heart disease of native coronary artery without angina pectoris: Secondary | ICD-10-CM | POA: Insufficient documentation

## 2016-04-24 DIAGNOSIS — S329XXA Fracture of unspecified parts of lumbosacral spine and pelvis, initial encounter for closed fracture: Secondary | ICD-10-CM | POA: Diagnosis not present

## 2016-04-24 DIAGNOSIS — S79911A Unspecified injury of right hip, initial encounter: Secondary | ICD-10-CM | POA: Diagnosis present

## 2016-04-24 DIAGNOSIS — Z7401 Bed confinement status: Secondary | ICD-10-CM | POA: Diagnosis not present

## 2016-04-24 DIAGNOSIS — E441 Mild protein-calorie malnutrition: Secondary | ICD-10-CM | POA: Diagnosis not present

## 2016-04-24 DIAGNOSIS — G4700 Insomnia, unspecified: Secondary | ICD-10-CM | POA: Diagnosis not present

## 2016-04-24 DIAGNOSIS — Y999 Unspecified external cause status: Secondary | ICD-10-CM | POA: Diagnosis not present

## 2016-04-24 DIAGNOSIS — R296 Repeated falls: Secondary | ICD-10-CM | POA: Insufficient documentation

## 2016-04-24 DIAGNOSIS — E039 Hypothyroidism, unspecified: Secondary | ICD-10-CM | POA: Diagnosis not present

## 2016-04-24 DIAGNOSIS — Y929 Unspecified place or not applicable: Secondary | ICD-10-CM | POA: Diagnosis not present

## 2016-04-24 DIAGNOSIS — M8000XD Age-related osteoporosis with current pathological fracture, unspecified site, subsequent encounter for fracture with routine healing: Secondary | ICD-10-CM | POA: Insufficient documentation

## 2016-04-24 MED ORDER — ACETAMINOPHEN 325 MG PO TABS
650.0000 mg | ORAL_TABLET | Freq: Once | ORAL | Status: AC
  Administered 2016-04-24: 650 mg via ORAL
  Filled 2016-04-24: qty 2

## 2016-04-24 MED ORDER — BACITRACIN ZINC 500 UNIT/GM EX OINT
TOPICAL_OINTMENT | CUTANEOUS | Status: AC
  Administered 2016-04-24: 1 via TOPICAL
  Filled 2016-04-24: qty 0.9

## 2016-04-24 MED ORDER — BACITRACIN ZINC 500 UNIT/GM EX OINT
TOPICAL_OINTMENT | CUTANEOUS | Status: AC
  Administered 2016-04-24: 1
  Filled 2016-04-24: qty 0.9

## 2016-04-24 MED ORDER — BACITRACIN ZINC 500 UNIT/GM EX OINT
TOPICAL_OINTMENT | Freq: Two times a day (BID) | CUTANEOUS | Status: DC
  Administered 2016-04-24: 1 via TOPICAL

## 2016-04-24 NOTE — ED Notes (Signed)
Pt updated about EMS transporting her back to [facility], pt verbalized understanding of this.

## 2016-04-24 NOTE — ED Notes (Signed)
Pt was ambulated with this RN and Willow Ora, EDT. Pt was able to ambulate with walker. Pt reports being able to "bear some weight" to right hip and leg. When pt was getting back in bed, pt reported pain while lifting legs. Pt reports right hip pain while laying in bed after ambulation. Pt was able to ambulate with stand-by-assist. MD notified.

## 2016-04-24 NOTE — ED Notes (Signed)
Pt has 2 skin tears (right elbow and left forearm), this RN applied ordered ointment to both sites (see MAR) and EDT Thanh wrapped pt's right elbow and left forearm with non adhesive gauze.

## 2016-04-24 NOTE — Discharge Instructions (Signed)
You have a tiny fracture off the side of your hip bone. This does not require surgery. Please usual walker to ambulate at all times. Return to the ER for worsening symptoms, persistent vomiting, difficulty breathing or other concerns.

## 2016-04-24 NOTE — ED Triage Notes (Signed)
Patient comes in from [facility] via [facility] with multiple unwitnessed falls. Patient has skin tears to the right elbow and left forearm. Patient also has a knot on the back right side of head. Denies LOC. Patient also complains of right hip pain with movement and palpation. Patient states she rolled out of the bed. EMS reports that the bed did not have rails or pads in the floor plus patient has issues with getting up multiple times in the night. Patient does use a walker at facility. Had recent shunt place in the right forearm for dialysis.

## 2016-04-24 NOTE — ED Provider Notes (Signed)
[facility] Emergency Department Provider Note   ____________________________________________   First MD Initiated Contact with Patient 04/24/16 0019     (approximate)  I have reviewed the triage vital signs and the nursing notes.   HISTORY  Chief Complaint Fall    HPI Sharon [name] is a 83 y.o. female brought to the ED from [facility] via EMS with a chief complaint of mechanical fall. Patient reports rolling out of the bed and striking her right hip. States she was able to ambulate with assistance after her fall. Denies striking head or LOC. History of frequent falls, last seen in the ED for fall 3 weeks ago. Patient also has a history of renal failure on hemodialysis Tuesday/Thursday/Saturday; she did receive dialysis yesterday. Has a new right arm fistula which was placed recently. Denies fever, chills, headache, neck pain, vision changes, chest pain, shortness of breath, abdominal pain, nausea, vomiting, diarrhea. Nothing makes her symptoms better. Movement makes her symptoms worse.   Past Medical History:  Diagnosis Date  . Arthritis   . CAD (coronary artery disease)   . Cancer ([place])    skin  . Cardiomyopathy ([place])   . Depression   . IBS (irritable bowel syndrome) 2010  . Kidney failure July 2012   Hemodialysis 3xweek  . Kidney failure   . Meniere disease   . Meniere's disease   . Myocardial infarction   . OSA (obstructive sleep apnea)    CPAP  . Peritoneal dialysis status ([place])   . Presence of permanent cardiac pacemaker     Patient Active Problem List   Diagnosis Date Noted  . ESRD on dialysis ([place]) 04/10/2016  . Pressure injury of skin 12/15/2015  . Closed left subtrochanteric femur fracture ([place]) 12/15/2015  . Femur fracture, left ([place]) 12/14/2015  . Meniere disease   . Loss of weight 10/21/2015  . Skin tear of elbow without complication 19/37/9024  . Clinical depression 06/20/2015  . Combined fat and carbohydrate induced  hyperlipemia 06/20/2015  . Dysphagia 05/24/2015  . Dyspnea 05/24/2015  . Imbalance 04/22/2015  . Electrical shock sensation in right posterior head 11/22/2014  . Fall at home 11/15/2014  . Expressive aphasia 10/04/2014  . Breathlessness on exertion 09/13/2014  . Chronic pain syndrome 05/22/2014  . Insomnia 03/13/2014  . Anxiety state 03/13/2014  . Arteriosclerosis of coronary artery 01/15/2014  . Chronic systolic heart failure ([place]) 01/15/2014  . Obstructive apnea 01/15/2014  . Basal cell carcinoma of neck 11/14/2013  . Degeneration of intervertebral disc of cervical region 11/07/2013  . Cervical radiculitis 10/16/2013  . Pelvic pain in female 09/12/2013  . Neck pain of over 3 months duration 09/12/2013  . Memory loss 09/12/2013  . Systolic heart failure, chronic ([place]) 05/12/2013  . Medicare annual wellness visit, subsequent 11/10/2012  . Sinus node dysfunction ([place]) 08/01/2012  . Low back pain 08/01/2012  . Cervical spine pain 05/02/2012  . Irritable bowel syndrome 11/02/2011  . Depression 04/01/2011  . Hypertension 04/01/2011  . Menopausal disorder 04/01/2011  . Screening for breast cancer 04/01/2011  . Chronic kidney disease, stage 5, kidney failure ([place]) 04/01/2011    Past Surgical History:  Procedure Laterality Date  . [place]  . ANUS SURGERY  2010  . AV FISTULA PLACEMENT Right 04/15/2016   Procedure: ARTERIOVENOUS (AV) FISTULA CREATION ( RADIOCEPHALIC ) STAGE 2;  Surgeon: Algernon Huxley, MD;  Location: [facility] ORS;  Service: Vascular;  Laterality: Right;  . CATARACT EXTRACTION  2006,  2011  . CHOLECYSTECTOMY  01/2008  . EYE SURGERY     cataracts bilateral  . FEMUR IM NAIL Left 12/15/2015   Procedure: INTRAMEDULLARY (IM) RETROGRADE FEMORAL NAILING;  Surgeon: Oletta Cohn, DO;  Location: [facility] ORS;  Service: Orthopedics;  Laterality: Left;  . INSERTION OF DIALYSIS CATHETER  07/2010  . JOINT REPLACEMENT     left knee  . MINOR REMOVAL OF PERITONEAL  DIALYSIS CATHETER  04/15/2016   Procedure: MINOR REMOVAL OF PERITONEAL DIALYSIS CATHETER;  Surgeon: Algernon Huxley, MD;  Location: [facility] ORS;  Service: Vascular;;  . PACEMAKER INSERTION  2006  . PERIPHERAL VASCULAR CATHETERIZATION N/A 12/18/2015   Procedure: Dialysis/Perma Catheter Insertion;  Surgeon: Katha Cabal, MD;  Location: [facility];  Service: Cardiovascular;  Laterality: N/A;  . TOTAL KNEE ARTHROPLASTY  2008   LEFT/Dr [place]    Prior to Admission medications   Medication Sig Start Date End Date Taking? Authorizing Provider  acetaminophen (TYLENOL) 325 MG tablet Take 650 mg by mouth every 4 (four) hours as needed for mild pain.    Historical Provider, MD  benzonatate (TESSALON) 100 MG capsule Take 100 mg by mouth 3 (three) times daily.    Historical Provider, MD  calcitRIOL (ROCALTROL) 0.25 MCG capsule Take 0.25 mcg by mouth daily.    Historical Provider, MD  diphenoxylate-atropine (LOMOTIL) 2.5-0.025 MG tablet TAKE 1 TABLET BY MOUTH FOUR TIMES DAILY AS NEEDED FOR DIARRHEA OR LOOSE STOOLS Patient taking differently: TAKE 1 TABLET BY MOUTH every 8 hours AS NEEDED FOR DIARRHEA OR LOOSE STOOLS 08/30/15   [name], MD  enoxaparin (LOVENOX) 30 MG/0.3ML injection Inject 0.3 mLs (30 mg total) into the skin daily. Patient not taking: Reported on 04/10/2016 12/17/15   [name], MD  folic acid-vitamin b complex-vitamin c-selenium-zinc (DIALYVITE) 3 MG TABS tablet Take 1 tablet by mouth daily. 03/31/16   [name], MD  guaifenesin (ROBITUSSIN) 100 MG/5ML syrup Take 200 mg by mouth every 4 (four) hours as needed for cough.    Historical Provider, MD  levothyroxine (SYNTHROID, LEVOTHROID) 25 MCG tablet Take 1 tablet (25 mcg total) by mouth daily. 10/25/15   [name], MD  meclizine (ANTIVERT) 25 MG tablet Take 1 tablet (25 mg total) by mouth 3 (three) times daily as needed for dizziness. Patient not taking: Reported on 04/15/2016 04/22/15   [name], MD    metoprolol succinate (TOPROL XL) 25 MG 24 hr tablet TAKE 1/2 BY MOUTH DAILY 01/30/16   [name], MD  multivitamin-iron-minerals-folic acid [facility]) TABS tablet Take 1 tablet by mouth daily.    Historical Provider, MD  potassium chloride (K-DUR,KLOR-CON) 10 MEQ tablet Take 10 mEq by mouth once.    Historical Provider, MD  senna-docusate (SENOKOT-S) 8.6-50 MG tablet Take 1 tablet by mouth 2 (two) times daily. Patient not taking: Reported on 04/10/2016 12/17/15   [name], MD  sertraline (ZOLOFT) 50 MG tablet TAKE 3 TABLETS(150 MG) BY MOUTH DAILY 08/30/15   [name], MD  traMADol (ULTRAM) 50 MG tablet Take 1 tablet (50 mg total) by mouth every 6 (six) hours as needed. 04/15/16   Algernon Huxley, MD  Vitamin D, Ergocalciferol, (DRISDOL) 50000 units CAPS capsule Take 50,000 Units by mouth every 7 (seven) days.  04/17/15   Historical Provider, MD    Allergies Statins; Effexor [venlafaxine]; and Codeine  Family History  Problem Relation Age of Onset  . Cancer Mother     ? ovarian - sarcoma  .  Cancer Father     Skin cancer  . Diabetes Sister   . COPD Sister   . Depression Sister   . Cancer Sister     Lung - 49 yrs old    Social History Social History  Substance Use Topics  . Smoking status: Former Smoker    Quit date: 03/31/1948  . Smokeless tobacco: Never Used  . Alcohol use No    Review of Systems Constitutional: No fever/chills Eyes: No visual changes. ENT: No sore throat. Cardiovascular: Denies chest pain. Respiratory: Denies shortness of breath. Gastrointestinal: No abdominal pain.  No nausea, no vomiting.  No diarrhea.  No constipation. Genitourinary: Negative for dysuria. Musculoskeletal: Positive for right hip pain. Negative for back pain. Skin: Negative for rash. Neurological: Negative for headaches, focal weakness or numbness.  10-point ROS otherwise negative.  ____________________________________________   PHYSICAL EXAM:  VITAL SIGNS: ED  Triage Vitals [04/24/16 0015]  Enc Vitals Group     BP      Pulse      Resp      Temp      Temp src      SpO2 96 %     Weight 100 lb (45.4 kg)     Height 5\' 4"  (1.626 m)     Head Circumference      Peak Flow      Pain Score      Pain Loc      Pain Edu?      Excl. in [place]?     Constitutional: Alert and oriented. Frail appearing and in no acute distress. Eyes: Conjunctivae are normal. PERRL. EOMI. Head: Atraumatic. Healing right forehead and periorbital ecchymosis. Nose: No congestion/rhinnorhea. Mouth/Throat: Mucous membranes are moist.  Oropharynx non-erythematous. Neck: No stridor.  No cervical spine tenderness to palpation.  No step-offs or deformities noted. Cardiovascular: Normal rate, regular rhythm. Grossly normal heart sounds.  Good peripheral circulation. Respiratory: Normal respiratory effort.  No retractions. Lungs CTAB. Gastrointestinal: Soft and nontender. No distention. No abdominal bruits. No CVA tenderness. Musculoskeletal: No spinal tenderness to palpation. Pelvis stable. Right hip mildly tender to palpation. Full range of motion with slight pain. There is no shortening or rotation. No lower extremity edema.  No joint effusions. Small left forearm skin tear. Neurologic:  Normal speech and language. No gross focal neurologic deficits are appreciated.  Skin:  Skin is warm, dry and intact. No rash noted. Psychiatric: Mood and affect are normal. Speech and behavior are normal.  ____________________________________________   LABS (all labs ordered are listed, but only abnormal results are displayed)  Labs Reviewed - No data to display ____________________________________________  EKG  None ____________________________________________  RADIOLOGY  Right hip x-rays interpreted per Dr. Radene Knee: 1. Mild cortical irregularity suggested about the right greater  femoral trochanter. Would correlate for any associated symptoms, to  exclude underlying avulsion fracture.  The right femoral neck remains  intact.  2. Scattered vascular calcifications seen.    CT hip interpreted per Dr. Marisue Humble: Mildly comminuted minimally displaced greater trochanteric fracture. ____________________________________________   PROCEDURES  Procedure(s) performed: None  Procedures  Critical Care performed: No  ____________________________________________   INITIAL IMPRESSION / ASSESSMENT AND PLAN / ED COURSE  Pertinent labs & imaging results that were available during my care of the patient were reviewed by me and considered in my medical decision making (see chart for details).  83 year old female who presents status post mechanical fall at the nursing facility. Complains of right hip pain. We'll obtain x-ray imaging studies.  Clinical Course as of Apr 25 619  Fri Apr 24, 2016  0115 Question of cortical irregularity seen on x-ray around the right femoral head. Will obtain CT scan to further delineate.  [JS]  3557 Updated patient of CT imaging results. Discussed with orthopedics on call Dr. Rudene Christians; fracture is nonoperative. Will ambulate patient with walker.  [JS]  [id number] Patient was ambulatory with walker. Will be discharged back to nursing facility. Strict return precautions given. Patient verbalizes understanding and agrees with plan of care.  [JS]    Clinical Course User Index [JS] Paulette Blanch, MD     ____________________________________________   FINAL CLINICAL IMPRESSION(S) / ED DIAGNOSES  Final diagnoses:  Fall, initial encounter  Closed fracture of trochanter of right femur, initial encounter ([place])      NEW MEDICATIONS STARTED DURING THIS VISIT:  New Prescriptions   No medications on file     Note:  This document was prepared using Dragon voice recognition software and may include unintentional dictation errors.    Paulette Blanch, MD 04/24/16 [phone]

## 2016-04-25 DIAGNOSIS — N186 End stage renal disease: Secondary | ICD-10-CM | POA: Diagnosis not present

## 2016-04-25 DIAGNOSIS — Z992 Dependence on renal dialysis: Secondary | ICD-10-CM | POA: Diagnosis not present

## 2016-04-25 DIAGNOSIS — D509 Iron deficiency anemia, unspecified: Secondary | ICD-10-CM | POA: Diagnosis not present

## 2016-04-25 DIAGNOSIS — D631 Anemia in chronic kidney disease: Secondary | ICD-10-CM | POA: Diagnosis not present

## 2016-04-27 ENCOUNTER — Encounter: Payer: Self-pay | Admitting: Family Medicine

## 2016-04-27 ENCOUNTER — Telehealth: Payer: Self-pay | Admitting: Family Medicine

## 2016-04-27 ENCOUNTER — Ambulatory Visit (INDEPENDENT_AMBULATORY_CARE_PROVIDER_SITE_OTHER): Payer: Medicare Other | Admitting: Family Medicine

## 2016-04-27 ENCOUNTER — Ambulatory Visit
Admission: RE | Admit: 2016-04-27 | Discharge: 2016-04-27 | Disposition: A | Discharge status: Home / Self Care | Payer: Medicare Other | Source: Ambulatory Visit | Attending: Family Medicine | Admitting: Family Medicine

## 2016-04-27 VITALS — BP 128/66 | HR 60 | Temp 98.3°F

## 2016-04-27 DIAGNOSIS — N2581 Secondary hyperparathyroidism of renal origin: Secondary | ICD-10-CM | POA: Diagnosis not present

## 2016-04-27 DIAGNOSIS — S62325A Displaced fracture of shaft of fourth metacarpal bone, left hand, initial encounter for closed fracture: Secondary | ICD-10-CM | POA: Insufficient documentation

## 2016-04-27 DIAGNOSIS — S51012A Laceration without foreign body of left elbow, initial encounter: Secondary | ICD-10-CM | POA: Diagnosis not present

## 2016-04-27 DIAGNOSIS — R296 Repeated falls: Secondary | ICD-10-CM | POA: Insufficient documentation

## 2016-04-27 DIAGNOSIS — M79632 Pain in left forearm: Secondary | ICD-10-CM | POA: Diagnosis not present

## 2016-04-27 DIAGNOSIS — W19XXXA Unspecified fall, initial encounter: Secondary | ICD-10-CM

## 2016-04-27 DIAGNOSIS — M79642 Pain in left hand: Secondary | ICD-10-CM | POA: Insufficient documentation

## 2016-04-27 DIAGNOSIS — I6782 Cerebral ischemia: Secondary | ICD-10-CM | POA: Diagnosis not present

## 2016-04-27 DIAGNOSIS — D509 Iron deficiency anemia, unspecified: Secondary | ICD-10-CM | POA: Diagnosis not present

## 2016-04-27 DIAGNOSIS — M25532 Pain in left wrist: Secondary | ICD-10-CM | POA: Diagnosis not present

## 2016-04-27 DIAGNOSIS — S0990XA Unspecified injury of head, initial encounter: Secondary | ICD-10-CM | POA: Insufficient documentation

## 2016-04-27 DIAGNOSIS — G319 Degenerative disease of nervous system, unspecified: Secondary | ICD-10-CM | POA: Insufficient documentation

## 2016-04-27 DIAGNOSIS — Z992 Dependence on renal dialysis: Secondary | ICD-10-CM | POA: Diagnosis not present

## 2016-04-27 DIAGNOSIS — S59912A Unspecified injury of left forearm, initial encounter: Secondary | ICD-10-CM | POA: Diagnosis not present

## 2016-04-27 DIAGNOSIS — D631 Anemia in chronic kidney disease: Secondary | ICD-10-CM | POA: Diagnosis not present

## 2016-04-27 DIAGNOSIS — S6992XA Unspecified injury of left wrist, hand and finger(s), initial encounter: Secondary | ICD-10-CM | POA: Diagnosis not present

## 2016-04-27 DIAGNOSIS — N186 End stage renal disease: Secondary | ICD-10-CM | POA: Diagnosis not present

## 2016-04-27 NOTE — Telephone Encounter (Signed)
Reviewed CT scan results. No acute changes noted. Reviewed images of left wrist and hand and there appears to be a fourth metacarpal fracture. No other obvious abnormalities on my review though the radiologist has not read them yet. Given the apparent fracture I attempted to contact the patient and her brother as he was along with her at her visit. The number that is listed is his home number and he was not there. He does not have his cell phone with him per his wife. Contacted the radiology department and they had already left. I contacted the patient's assisted living facility at [facility] and spoke with [place] who is a Marine scientist on the rehabilitation hall. Advised her of the x-ray findings and that when they got the patient back there the [facility] should [facility] to be evaluated for the fracture to likely be placed in a splint or cast. She noted as I was on the phone with her that the patient had arrived back to the assisted living facility and she would pass the information along to the patient's brother. Advised that they closed at 7:30 and she should go for evaluation tonight. I provided the nurse with the address. She is going to relay that information to the patient's brother.

## 2016-04-27 NOTE — Progress Notes (Signed)
[name], MD Phone: [phone]  Sharon [name] is a 83 y.o. female who presents today for follow-up.  Patient has had repeated falls over the last 6 months. She presents with her brother today and both of them provide some history. Initially she fell and broke her left femur and had a rod placed and followed up with orthopedics. That has not caused her any issue. She has fallen on several occasions and hit her head. She reports most recently she fell on Saturday and hit her head. Also hit her left hand and wrist. She notes no loss of consciousness with that fall. She also fell last week and was evaluated in the emergency room and found to have a right greater trochanter fracture that was apparently nonoperable. They recommended follow-up with her orthopedist. She believes that a lot of her falls are related to the medication she was on as she believes she may have been getting oxycodone and Depakote. Also some kind of sleeping pill. She reports when receiving these things initially when she started to fall she was seeing things though has not seen anything in some time. She notes she has refused any psychotropic medications and feels as though this has helped. She lives in an assisted living facility and does not always use her walker. Her brother reports that it was not reported that she fell 2 days ago. She reports some vision changes in her right eye that have been chronic and not worsened recently. She does not have follow-up set up with her orthopedist yet.  PMH: Former smoker   ROS see history of present illness  Objective  Physical Exam Vitals:   04/27/16 1552  BP: 128/66  Pulse: 60  Temp: 98.3 F (36.8 C)    BP Readings from Last 3 Encounters:  04/27/16 128/66  04/24/16 (!) 141/74  04/15/16 117/61   Wt Readings from Last 3 Encounters:  04/24/16 100 lb (45.4 kg)  04/10/16 100 lb (45.4 kg)  04/10/16 100 lb (45.4 kg)    Physical Exam  HENT:  Mouth/Throat:  Oropharynx is clear and moist. No oropharyngeal exudate.  Abrasion to right frontal temporal area with no surrounding swelling  Eyes: Conjunctivae are normal. Pupils are equal, round, and reactive to light.  Cardiovascular: Normal rate, regular rhythm and normal heart sounds.   Pulmonary/Chest: Effort normal and breath sounds normal.  Musculoskeletal: She exhibits no edema.  Left ulnar aspect of dorsum of hand and wrist and distal forearm with bruising, there is tenderness over the midportion of the dorsum of the hand, there are no palpable bony defects, the hand is warm and appears well perfused  Neurological: She is alert.  Left pupil enlarged compared to right pupil, both pupils reactive to light, otherwise CN 2-12 intact, 5/5 strength in bilateral biceps, triceps, grip, quads, hamstrings, plantar and dorsiflexion, sensation to light touch intact in bilateral UE and LE  Skin: Skin is warm and dry. She is not diaphoretic.  There is an abrasion over her left dorsal mid forearm that has a gauze-like material on it that is stuck to the area, when trying to pull this off it starts to pull the skin off, no surrounding erythema or tenderness     Assessment/Plan: Please see individual problem list.  Skin tear of elbow without complication Apparent skin tear over the left forearm. The area appears to be fairly well-healing from what I can see. There is a piece of material in this that we were unable to get off  given attachment to the skin. Advised soaking this with sterile water or saline to see if it would improve and allow removal. Discussed having one of the nurses at her living facility help her with this.  Falls Patient with repeated recurrent falls over the last 6 months. Has had a number of injuries. Today noted to have a enlarged left pupil with no prior documentation of this and likely head injury over the weekend. Also with injury to her left hand. We will obtain a CT scan of her head to  evaluate given the left pupil enlargement. We will obtain x-rays of her left hand, wrist, and forearm to evaluate for fracture. We will get her to follow-up with her orthopedist for her hip. We will need to get her back in to see physical therapy for balance training. If this continues to be an issue we may need evaluation with neurology to see if there are some other aspect playing a role. Discussed monitoring and using her walker. Given return precautions with regards to head injury.  Head injury Patient with apparent head injury possibly sometime in the last several days. She has new neurological finding with left pupillary enlargement. Given this new finding we will obtain CT scan of her head today to evaluate further. We will contact them with the results when this comes back.  Left hand pain Patient with left hand pain and bruising. Also bruising of her left wrist and forearm. Given possible fall and tenderness we'll obtain an x-ray to evaluate further. Appears to be neurovascularly intact.   Orders Placed This Encounter  Procedures  . CT Head Wo Contrast    Standing Status:   Future    Number of Occurrences:   1    Standing Expiration Date:   07/27/2017    Order Specific Question:   Reason for Exam (SYMPTOM  OR DIAGNOSIS REQUIRED)    Answer:   fall 2 days ago with reported head injury, no LOC, left pupil enlarged compared to right pupil    Order Specific Question:   Preferred imaging location?    Answer:   [facility]    Order Specific Question:   Radiology Contrast Protocol - do NOT remove file path    Answer:   \\[email or web address].pdf  . DG Hand Complete Left    Standing Status:   Future    Number of Occurrences:   1    Standing Expiration Date:   06/27/2017    Order Specific Question:   Reason for Exam (SYMPTOM  OR DIAGNOSIS REQUIRED)    Answer:   left hand injury, bruising, tenderness    Order Specific Question:   Preferred imaging location?    Answer:    [facility]    Order Specific Question:   Radiology Contrast Protocol - do NOT remove file path    Answer:   \\[email or web address]s.pdf  . DG Wrist Complete Left    Standing Status:   Future    Number of Occurrences:   1    Standing Expiration Date:   06/27/2017    Order Specific Question:   Reason for Exam (SYMPTOM  OR DIAGNOSIS REQUIRED)    Answer:   left hand injury, bruising, tenderness    Order Specific Question:   Preferred imaging location?    Answer:   [facility]    Order Specific Question:   Radiology Contrast Protocol - do NOT remove file path    Answer:   \\[email or web address]s.pdf  . DG Forearm Left  Standing Status:   Future    Number of Occurrences:   1    Standing Expiration Date:   06/27/2017    Order Specific Question:   Reason for Exam (SYMPTOM  OR DIAGNOSIS REQUIRED)    Answer:   left hand and fore arm injury    Order Specific Question:   Preferred imaging location?    Answer:   [facility]    Order Specific Question:   Radiology Contrast Protocol - do NOT remove file path    Answer:   \\[email or web address]s.[email or web address]    [name], MD [facility]

## 2016-04-27 NOTE — Progress Notes (Signed)
Pre visit review using our clinic review tool, if applicable. No additional management support is needed unless otherwise documented below in the visit note. 

## 2016-04-27 NOTE — Assessment & Plan Note (Addendum)
Patient with repeated recurrent falls over the last 6 months. Has had a number of injuries. Today noted to have a enlarged left pupil with no prior documentation of this and likely head injury over the weekend. Also with injury to her left hand. We will obtain a CT scan of her head to evaluate given the left pupil enlargement. We will obtain x-rays of her left hand, wrist, and forearm to evaluate for fracture. We will get her to follow-up with her orthopedist for her hip. We will need to get her back in to see physical therapy for balance training. If this continues to be an issue we may need evaluation with neurology to see if there are some other aspect playing a role. Discussed monitoring and using her walker. Given return precautions with regards to head injury.

## 2016-04-27 NOTE — Patient Instructions (Signed)
Nice to see you. Please go [facility] the [facility] mall for the CT scan and x-ray of your left arm.

## 2016-04-27 NOTE — Telephone Encounter (Signed)
**  After Hours/ Emergency Line Call*  Received a page from radiology to call about [place]. Returned call. No answer. Patient is not a Family medicine teaching service patient. Please contact PCP office in future.    Carlyle Dolly, MD PGY-2, [facility]

## 2016-04-27 NOTE — Assessment & Plan Note (Signed)
Patient with left hand pain and bruising. Also bruising of her left wrist and forearm. Given possible fall and tenderness we'll obtain an x-ray to evaluate further. Appears to be neurovascularly intact.

## 2016-04-27 NOTE — Assessment & Plan Note (Signed)
Apparent skin tear over the left forearm. The area appears to be fairly well-healing from what I can see. There is a piece of material in this that we were unable to get off given attachment to the skin. Advised soaking this with sterile water or saline to see if it would improve and allow removal. Discussed having one of the nurses at her living facility help her with this.

## 2016-04-27 NOTE — Assessment & Plan Note (Signed)
Patient with apparent head injury possibly sometime in the last several days. She has new neurological finding with left pupillary enlargement. Given this new finding we will obtain CT scan of her head today to evaluate further. We will contact them with the results when this comes back.

## 2016-04-28 ENCOUNTER — Encounter: Payer: Self-pay | Admitting: Emergency Medicine

## 2016-04-28 ENCOUNTER — Emergency Department
Admission: EM | Admit: 2016-04-28 | Discharge: 2016-04-28 | Disposition: A | Discharge status: Home / Self Care | Payer: Medicare Other | Attending: Student in an Organized Health Care Education/Training Program | Admitting: Student in an Organized Health Care Education/Training Program

## 2016-04-28 DIAGNOSIS — S72114A Nondisplaced fracture of greater trochanter of right femur, initial encounter for closed fracture: Secondary | ICD-10-CM | POA: Diagnosis not present

## 2016-04-28 DIAGNOSIS — N186 End stage renal disease: Secondary | ICD-10-CM | POA: Insufficient documentation

## 2016-04-28 DIAGNOSIS — Z7901 Long term (current) use of anticoagulants: Secondary | ICD-10-CM | POA: Diagnosis not present

## 2016-04-28 DIAGNOSIS — Y999 Unspecified external cause status: Secondary | ICD-10-CM | POA: Diagnosis not present

## 2016-04-28 DIAGNOSIS — Z87891 Personal history of nicotine dependence: Secondary | ICD-10-CM | POA: Diagnosis not present

## 2016-04-28 DIAGNOSIS — I132 Hypertensive heart and chronic kidney disease with heart failure and with stage 5 chronic kidney disease, or end stage renal disease: Secondary | ICD-10-CM | POA: Insufficient documentation

## 2016-04-28 DIAGNOSIS — Y929 Unspecified place or not applicable: Secondary | ICD-10-CM | POA: Insufficient documentation

## 2016-04-28 DIAGNOSIS — M79604 Pain in right leg: Secondary | ICD-10-CM | POA: Diagnosis not present

## 2016-04-28 DIAGNOSIS — X58XXXA Exposure to other specified factors, initial encounter: Secondary | ICD-10-CM | POA: Diagnosis not present

## 2016-04-28 DIAGNOSIS — I252 Old myocardial infarction: Secondary | ICD-10-CM | POA: Diagnosis not present

## 2016-04-28 DIAGNOSIS — I2510 Atherosclerotic heart disease of native coronary artery without angina pectoris: Secondary | ICD-10-CM | POA: Diagnosis not present

## 2016-04-28 DIAGNOSIS — I5022 Chronic systolic (congestive) heart failure: Secondary | ICD-10-CM | POA: Insufficient documentation

## 2016-04-28 DIAGNOSIS — Z79899 Other long term (current) drug therapy: Secondary | ICD-10-CM | POA: Insufficient documentation

## 2016-04-28 DIAGNOSIS — Y939 Activity, unspecified: Secondary | ICD-10-CM | POA: Diagnosis not present

## 2016-04-28 DIAGNOSIS — Z950 Presence of cardiac pacemaker: Secondary | ICD-10-CM | POA: Diagnosis not present

## 2016-04-28 DIAGNOSIS — M25551 Pain in right hip: Secondary | ICD-10-CM | POA: Diagnosis not present

## 2016-04-28 DIAGNOSIS — Z743 Need for continuous supervision: Secondary | ICD-10-CM | POA: Diagnosis not present

## 2016-04-28 DIAGNOSIS — W19XXXA Unspecified fall, initial encounter: Secondary | ICD-10-CM | POA: Diagnosis not present

## 2016-04-28 MED ORDER — TRAMADOL HCL 50 MG PO TABS
50.0000 mg | ORAL_TABLET | Freq: Once | ORAL | Status: AC
  Administered 2016-04-28: 50 mg via ORAL

## 2016-04-28 MED ORDER — TRAMADOL HCL 50 MG PO TABS
ORAL_TABLET | ORAL | Status: AC
  Administered 2016-04-28: 50 mg via ORAL
  Filled 2016-04-28: qty 1

## 2016-04-28 MED ORDER — LIDOCAINE 5 % EX PTCH: 1.0000 | MEDICATED_PATCH | Freq: Two times a day (BID) | CUTANEOUS | 0 refills | Status: DC

## 2016-04-28 NOTE — ED Triage Notes (Signed)
Pt ems from [facility] with known right trochanter fx with increasing pain. Denies fall or new injury

## 2016-04-28 NOTE — Telephone Encounter (Signed)
Spoke to patients brother, informed him patient has appointment at [facility] clinic with Cameron Proud on 05/04/16 at 0930.

## 2016-04-28 NOTE — Telephone Encounter (Signed)
fyi

## 2016-04-28 NOTE — ED Notes (Signed)
Called [facility] and was put on hold. Held for 5 minutes.

## 2016-04-28 NOTE — ED Notes (Signed)
Called EMS for transport [phone]

## 2016-04-28 NOTE — Telephone Encounter (Signed)
Pt called and stated that she has not been given the proper information as far as what's going on. Pt asked to be called with the information that she needs about what is going on. Please advise, thank you!  Call pt @ [phone]

## 2016-04-28 NOTE — ED Provider Notes (Signed)
[facility] Emergency Department Provider Note    First MD Initiated Contact with Patient 04/28/16 1750     (approximate)  I have reviewed the triage vital signs and the nursing notes.   HISTORY  Chief Complaint Hip Pain    HPI SEHAM GARDENHIRE is a 83 y.o. female presents for evaluation of right hip pain after recent discharge and diagnosis with greater trochanteric fracture. Patient was being moved out of bed and had sudden onset pain. Patient refused any medication to treat her pain. [facility] then sent her to the ER despite the patient stating that she did not want to be transferred to the ER. No interval falls. Denies any other associated pain. Has follow-up with orthopedics.   Past Medical History:  Diagnosis Date  . Arthritis   . CAD (coronary artery disease)   . Cancer ([place])    skin  . Cardiomyopathy ([place])   . Depression   . IBS (irritable bowel syndrome) 2010  . Kidney failure July 2012   Hemodialysis 3xweek  . Kidney failure   . Meniere disease   . Meniere's disease   . Myocardial infarction   . OSA (obstructive sleep apnea)    CPAP  . Peritoneal dialysis status ([place])   . Presence of permanent cardiac pacemaker    Family History  Problem Relation Age of Onset  . Cancer Mother     ? ovarian - sarcoma  . Cancer Father     Skin cancer  . Diabetes Sister   . COPD Sister   . Depression Sister   . Cancer Sister     Lung - 2 yrs old   Past Surgical History:  Procedure Laterality Date  . [place]  . ANUS SURGERY  2010  . AV FISTULA PLACEMENT Right 04/15/2016   Procedure: ARTERIOVENOUS (AV) FISTULA CREATION ( RADIOCEPHALIC ) STAGE 2;  Surgeon: Algernon Huxley, MD;  Location: [facility] ORS;  Service: Vascular;  Laterality: Right;  . CATARACT EXTRACTION  2006, 2011  . CHOLECYSTECTOMY  01/2008  . EYE SURGERY     cataracts bilateral  . FEMUR IM NAIL Left 12/15/2015   Procedure: INTRAMEDULLARY (IM) RETROGRADE  FEMORAL NAILING;  Surgeon: Oletta Cohn, DO;  Location: [facility] ORS;  Service: Orthopedics;  Laterality: Left;  . INSERTION OF DIALYSIS CATHETER  07/2010  . JOINT REPLACEMENT     left knee  . MINOR REMOVAL OF PERITONEAL DIALYSIS CATHETER  04/15/2016   Procedure: MINOR REMOVAL OF PERITONEAL DIALYSIS CATHETER;  Surgeon: Algernon Huxley, MD;  Location: [facility] ORS;  Service: Vascular;;  . PACEMAKER INSERTION  2006  . PERIPHERAL VASCULAR CATHETERIZATION N/A 12/18/2015   Procedure: Dialysis/Perma Catheter Insertion;  Surgeon: Katha Cabal, MD;  Location: [facility];  Service: Cardiovascular;  Laterality: N/A;  . TOTAL KNEE ARTHROPLASTY  2008   LEFT/Dr [place]   Patient Active Problem List   Diagnosis Date Noted  . Falls 04/27/2016  . Left hand pain 04/27/2016  . Head injury 04/27/2016  . ESRD on dialysis ([place]) 04/10/2016  . Pressure injury of skin 12/15/2015  . Closed left subtrochanteric femur fracture ([place]) 12/15/2015  . Femur fracture, left ([place]) 12/14/2015  . Meniere disease   . Loss of weight 10/21/2015  . Skin tear of elbow without complication 47/65/4650  . Clinical depression 06/20/2015  . Combined fat and carbohydrate induced hyperlipemia 06/20/2015  . Dysphagia 05/24/2015  . Dyspnea 05/24/2015  . Imbalance 04/22/2015  . Electrical shock  sensation in right posterior head 11/22/2014  . Fall at home 11/15/2014  . Expressive aphasia 10/04/2014  . Breathlessness on exertion 09/13/2014  . Chronic pain syndrome 05/22/2014  . Insomnia 03/13/2014  . Anxiety state 03/13/2014  . Arteriosclerosis of coronary artery 01/15/2014  . Chronic systolic heart failure ([place]) 01/15/2014  . Obstructive apnea 01/15/2014  . Basal cell carcinoma of neck 11/14/2013  . Degeneration of intervertebral disc of cervical region 11/07/2013  . Cervical radiculitis 10/16/2013  . Pelvic pain in female 09/12/2013  . Neck pain of over 3 months duration 09/12/2013  . Memory loss 09/12/2013  . Systolic  heart failure, chronic ([place]) 05/12/2013  . Medicare annual wellness visit, subsequent 11/10/2012  . Sinus node dysfunction ([place]) 08/01/2012  . Low back pain 08/01/2012  . Cervical spine pain 05/02/2012  . Irritable bowel syndrome 11/02/2011  . Depression 04/01/2011  . Hypertension 04/01/2011  . Menopausal disorder 04/01/2011  . Screening for breast cancer 04/01/2011  . Chronic kidney disease, stage 5, kidney failure ([place]) 04/01/2011      Prior to Admission medications   Medication Sig Start Date End Date Taking? Authorizing Provider  acetaminophen (TYLENOL) 325 MG tablet Take 650 mg by mouth every 4 (four) hours as needed for mild pain.    Historical Provider, MD  calcitRIOL (ROCALTROL) 0.25 MCG capsule Take 0.25 mcg by mouth daily.    Historical Provider, MD  diphenoxylate-atropine (LOMOTIL) 2.5-0.025 MG tablet TAKE 1 TABLET BY MOUTH FOUR TIMES DAILY AS NEEDED FOR DIARRHEA OR LOOSE STOOLS Patient taking differently: TAKE 1 TABLET BY MOUTH every 8 hours AS NEEDED FOR DIARRHEA OR LOOSE STOOLS 08/30/15   [name], MD  enoxaparin (LOVENOX) 30 MG/0.3ML injection Inject 0.3 mLs (30 mg total) into the skin daily. 12/17/15   [name], MD  folic acid-vitamin b complex-vitamin c-selenium-zinc (DIALYVITE) 3 MG TABS tablet Take 1 tablet by mouth daily. 03/31/16   [name], MD  guaifenesin (ROBITUSSIN) 100 MG/5ML syrup Take 200 mg by mouth every 4 (four) hours as needed for cough.    Historical Provider, MD  levothyroxine (SYNTHROID, LEVOTHROID) 25 MCG tablet Take 1 tablet (25 mcg total) by mouth daily. 10/25/15   [name], MD  lidocaine (LIDODERM) 5 % Place 1 patch onto the skin every 12 (twelve) hours. Remove & Discard patch within 12 hours or as directed by MD 04/28/16 04/28/17  Merlyn Lot, MD  meclizine (ANTIVERT) 25 MG tablet Take 1 tablet (25 mg total) by mouth 3 (three) times daily as needed for dizziness. 04/22/15   [name], MD  metoprolol succinate (TOPROL XL)  25 MG 24 hr tablet TAKE 1/2 BY MOUTH DAILY 01/30/16   [name], MD  multivitamin-iron-minerals-folic acid [facility]) TABS tablet Take 1 tablet by mouth daily.    Historical Provider, MD  potassium chloride (K-DUR,KLOR-CON) 10 MEQ tablet Take 10 mEq by mouth once.    Historical Provider, MD  senna-docusate (SENOKOT-S) 8.6-50 MG tablet Take 1 tablet by mouth 2 (two) times daily. 12/17/15   Srikar Sudini, MD  sertraline (ZOLOFT) 50 MG tablet TAKE 3 TABLETS(150 MG) BY MOUTH DAILY 08/30/15   [name], MD  traMADol (ULTRAM) 50 MG tablet Take 1 tablet (50 mg total) by mouth every 6 (six) hours as needed. 04/15/16   Algernon Huxley, MD  Vitamin D, Ergocalciferol, (DRISDOL) 50000 units CAPS capsule Take 50,000 Units by mouth every 7 (seven) days.  04/17/15   Historical Provider, MD    Allergies Statins; Effexor [  venlafaxine]; and Codeine    Social History Social History  Substance Use Topics  . Smoking status: Former Smoker    Quit date: 03/31/1948  . Smokeless tobacco: Never Used  . Alcohol use No    Review of Systems Patient denies headaches, rhinorrhea, blurry vision, numbness, shortness of breath, chest pain, edema, cough, abdominal pain, nausea, vomiting, diarrhea, dysuria, fevers, rashes or hallucinations unless otherwise stated above in HPI. ____________________________________________   PHYSICAL EXAM:  VITAL SIGNS: Vitals:   04/28/16 1803  BP: (!) 154/91  Pulse: 63  Temp: 98.4 F (36.9 C)    Constitutional: Alert and oriented. Frail and elderly  appearing but  in no acute distress. Eyes: Conjunctivae are normal. PERRL. EOMI. Head: Atraumatic. Nose: No congestion/rhinnorhea. Mouth/Throat: Mucous membranes are moist.  Oropharynx non-erythematous. Neck: No stridor. Painless ROM. No cervical spine tenderness to palpation Hematological/Lymphatic/Immunilogical: No cervical lymphadenopathy. Cardiovascular: Normal rate, regular rhythm. Grossly normal heart sounds.   Good peripheral circulation. Respiratory: Normal respiratory effort.  No retractions. Lungs CTAB. Gastrointestinal: Soft and nontender. No distention. No abdominal bruits. No CVA tenderness. Musculoskeletal: ttp over right greater trochanter.  No skin breakdown no effusion. Left hand ttp and ecchymosis.  SITL distally Neurologic:  Normal speech and language. No gross focal neurologic deficits are appreciated.  Skin:  Skin is warm, dry and intact. No rash noted.   ____________________________________________   LABS (all labs ordered are listed, but only abnormal results are displayed)  No results found for this or any previous visit (from the past 24 hour(s)). ____________________________________________  ____________________________________________   PROCEDURES  Procedure(s) performed:  Procedures    Critical Care performed: no ____________________________________________   INITIAL IMPRESSION / ASSESSMENT AND PLAN / ED COURSE  Pertinent labs & imaging results that were available during my care of the patient were reviewed by me and considered in my medical decision making (see chart for details).  DDX: fracture, contusion, skin breakdown  Annesha R Buonomo is a 83 y.o. who presents to the ED with known fracture of the right greater trochanter. Patient was given Tylenol with improvement in symptoms. Patient does not when he had another additional pain medications but does agree to take the Ultram that she is prescribed. Do not feel additional radiographic imaging indicated at this time and she's not had any interval falls. The patient is scheduled for follow-up imaging on Thursday with orthopedics. Incidentally patient has evidence of fourth metacarpal fracture of the left hand. Will be placed in splint. Discussed patient's case with her brother who is her power of attorney. He agrees that patient would benefit from higher level of care and will speak with the patient's primary care  doctor regarding referral for additional placement. At this point because the patient is acting at her baseline. No other signs or symptoms of acute abnormality. Agrees with follow-up with PCP and orthopedics.  Have discussed with the patient and available family all diagnostics and treatments performed thus far and all questions were answered to the best of my ability. The patient demonstrates understanding and agreement with plan.       ____________________________________________   FINAL CLINICAL IMPRESSION(S) / ED DIAGNOSES  Final diagnoses:  Pain of right hip joint  Closed nondisplaced fracture of greater trochanter of right femur, initial encounter ([place])      NEW MEDICATIONS STARTED DURING THIS VISIT:  New Prescriptions   LIDOCAINE (LIDODERM) 5 %    Place 1 patch onto the skin every 12 (twelve) hours. Remove & Discard patch within 12 hours  or as directed by MD     Note:  This document was prepared using Dragon voice recognition software and may include unintentional dictation errors.    Merlyn Lot, MD 04/28/16 2004

## 2016-04-28 NOTE — Telephone Encounter (Signed)
Pt has a sooner appt for 04/30/2016 @ 10:15am. Per Dr Mikle Bosworth office. Pt was made aware of the appt. Thank you!

## 2016-04-28 NOTE — Telephone Encounter (Signed)
Tried calling x 2, busy tone

## 2016-04-28 NOTE — ED Notes (Signed)
Patient still awaiting EMS transport. No needs at this time.

## 2016-04-29 ENCOUNTER — Telehealth: Payer: Self-pay | Admitting: Internal Medicine

## 2016-04-29 DIAGNOSIS — D631 Anemia in chronic kidney disease: Secondary | ICD-10-CM | POA: Diagnosis not present

## 2016-04-29 DIAGNOSIS — N186 End stage renal disease: Secondary | ICD-10-CM | POA: Diagnosis not present

## 2016-04-29 DIAGNOSIS — Z992 Dependence on renal dialysis: Secondary | ICD-10-CM | POA: Diagnosis not present

## 2016-04-29 DIAGNOSIS — W19XXXA Unspecified fall, initial encounter: Secondary | ICD-10-CM

## 2016-04-29 DIAGNOSIS — D509 Iron deficiency anemia, unspecified: Secondary | ICD-10-CM | POA: Diagnosis not present

## 2016-04-29 DIAGNOSIS — N2581 Secondary hyperparathyroidism of renal origin: Secondary | ICD-10-CM | POA: Diagnosis not present

## 2016-04-29 NOTE — Telephone Encounter (Signed)
Spoke to patients brother, he states the ER mentioned having patient transferred to a long term nursing facility. He would like to know if this can be done. He states [facility] informed him that patient has a fractured femur and is confused because he was informed it was her hip.please advise

## 2016-04-29 NOTE — Telephone Encounter (Signed)
Please advise, per chart, patient was taken to ER for hip pain by nursing home. Patient refused but was still taken.

## 2016-04-29 NOTE — Telephone Encounter (Signed)
I will forward to Melissa to see what we need to do to get this process moving.

## 2016-04-29 NOTE — Telephone Encounter (Signed)
Pt's brother called and had a few questions in regards to pt and why she needed to go to the ED yesterday. Please advise, thank you!  Call pt @ [phone]

## 2016-04-30 DIAGNOSIS — S62325A Displaced fracture of shaft of fourth metacarpal bone, left hand, initial encounter for closed fracture: Secondary | ICD-10-CM | POA: Diagnosis not present

## 2016-04-30 DIAGNOSIS — S72114A Nondisplaced fracture of greater trochanter of right femur, initial encounter for closed fracture: Secondary | ICD-10-CM | POA: Diagnosis not present

## 2016-04-30 NOTE — Telephone Encounter (Signed)
Please call patients brother with up date try [facility] for [facility] then cell [phone], thank you

## 2016-05-01 DIAGNOSIS — N2581 Secondary hyperparathyroidism of renal origin: Secondary | ICD-10-CM | POA: Diagnosis not present

## 2016-05-01 DIAGNOSIS — Z992 Dependence on renal dialysis: Secondary | ICD-10-CM | POA: Diagnosis not present

## 2016-05-01 DIAGNOSIS — D509 Iron deficiency anemia, unspecified: Secondary | ICD-10-CM | POA: Diagnosis not present

## 2016-05-01 DIAGNOSIS — D631 Anemia in chronic kidney disease: Secondary | ICD-10-CM | POA: Diagnosis not present

## 2016-05-01 DIAGNOSIS — N186 End stage renal disease: Secondary | ICD-10-CM | POA: Diagnosis not present

## 2016-05-01 NOTE — Telephone Encounter (Signed)
What referral would this be for? Home Health?

## 2016-05-01 NOTE — Telephone Encounter (Signed)
Please enter the referral so I can send it. [facility] should be able to set up long term nursing facility. I will see what I can find out.

## 2016-05-04 DIAGNOSIS — N2581 Secondary hyperparathyroidism of renal origin: Secondary | ICD-10-CM | POA: Diagnosis not present

## 2016-05-04 DIAGNOSIS — D631 Anemia in chronic kidney disease: Secondary | ICD-10-CM | POA: Diagnosis not present

## 2016-05-04 DIAGNOSIS — D509 Iron deficiency anemia, unspecified: Secondary | ICD-10-CM | POA: Diagnosis not present

## 2016-05-04 DIAGNOSIS — N186 End stage renal disease: Secondary | ICD-10-CM | POA: Diagnosis not present

## 2016-05-04 DIAGNOSIS — Z992 Dependence on renal dialysis: Secondary | ICD-10-CM | POA: Diagnosis not present

## 2016-05-04 NOTE — Telephone Encounter (Signed)
yes

## 2016-05-04 NOTE — Telephone Encounter (Signed)
Home health referral placed

## 2016-05-06 DIAGNOSIS — D509 Iron deficiency anemia, unspecified: Secondary | ICD-10-CM | POA: Diagnosis not present

## 2016-05-06 DIAGNOSIS — D631 Anemia in chronic kidney disease: Secondary | ICD-10-CM | POA: Diagnosis not present

## 2016-05-06 DIAGNOSIS — N2581 Secondary hyperparathyroidism of renal origin: Secondary | ICD-10-CM | POA: Diagnosis not present

## 2016-05-06 DIAGNOSIS — N186 End stage renal disease: Secondary | ICD-10-CM | POA: Diagnosis not present

## 2016-05-06 DIAGNOSIS — Z992 Dependence on renal dialysis: Secondary | ICD-10-CM | POA: Diagnosis not present

## 2016-05-08 DIAGNOSIS — N2581 Secondary hyperparathyroidism of renal origin: Secondary | ICD-10-CM | POA: Diagnosis not present

## 2016-05-08 DIAGNOSIS — Z992 Dependence on renal dialysis: Secondary | ICD-10-CM | POA: Diagnosis not present

## 2016-05-08 DIAGNOSIS — N186 End stage renal disease: Secondary | ICD-10-CM | POA: Diagnosis not present

## 2016-05-08 DIAGNOSIS — D509 Iron deficiency anemia, unspecified: Secondary | ICD-10-CM | POA: Diagnosis not present

## 2016-05-08 DIAGNOSIS — D631 Anemia in chronic kidney disease: Secondary | ICD-10-CM | POA: Diagnosis not present

## 2016-05-09 ENCOUNTER — Inpatient Hospital Stay
Admission: EM | Admit: 2016-05-09 | Discharge: 2016-05-14 | DRG: 480 | Disposition: A | Discharge status: Skilled Nursing Facility | Payer: Medicare Other | Attending: Internal Medicine | Admitting: Internal Medicine

## 2016-05-09 ENCOUNTER — Encounter: Payer: Self-pay | Admitting: Emergency Medicine

## 2016-05-09 ENCOUNTER — Emergency Department: Payer: Medicare Other

## 2016-05-09 DIAGNOSIS — N2581 Secondary hyperparathyroidism of renal origin: Secondary | ICD-10-CM | POA: Diagnosis not present

## 2016-05-09 DIAGNOSIS — D631 Anemia in chronic kidney disease: Secondary | ICD-10-CM | POA: Diagnosis present

## 2016-05-09 DIAGNOSIS — E039 Hypothyroidism, unspecified: Secondary | ICD-10-CM | POA: Diagnosis present

## 2016-05-09 DIAGNOSIS — N186 End stage renal disease: Secondary | ICD-10-CM | POA: Diagnosis not present

## 2016-05-09 DIAGNOSIS — Z888 Allergy status to other drugs, medicaments and biological substances status: Secondary | ICD-10-CM

## 2016-05-09 DIAGNOSIS — Y92129 Unspecified place in nursing home as the place of occurrence of the external cause: Secondary | ICD-10-CM

## 2016-05-09 DIAGNOSIS — I429 Cardiomyopathy, unspecified: Secondary | ICD-10-CM | POA: Diagnosis present

## 2016-05-09 DIAGNOSIS — I252 Old myocardial infarction: Secondary | ICD-10-CM

## 2016-05-09 DIAGNOSIS — S72002A Fracture of unspecified part of neck of left femur, initial encounter for closed fracture: Secondary | ICD-10-CM

## 2016-05-09 DIAGNOSIS — Z833 Family history of diabetes mellitus: Secondary | ICD-10-CM

## 2016-05-09 DIAGNOSIS — S299XXA Unspecified injury of thorax, initial encounter: Secondary | ICD-10-CM | POA: Diagnosis not present

## 2016-05-09 DIAGNOSIS — Z885 Allergy status to narcotic agent status: Secondary | ICD-10-CM

## 2016-05-09 DIAGNOSIS — Z85828 Personal history of other malignant neoplasm of skin: Secondary | ICD-10-CM | POA: Diagnosis not present

## 2016-05-09 DIAGNOSIS — M1990 Unspecified osteoarthritis, unspecified site: Secondary | ICD-10-CM | POA: Diagnosis present

## 2016-05-09 DIAGNOSIS — R52 Pain, unspecified: Secondary | ICD-10-CM | POA: Diagnosis not present

## 2016-05-09 DIAGNOSIS — I2510 Atherosclerotic heart disease of native coronary artery without angina pectoris: Secondary | ICD-10-CM | POA: Diagnosis present

## 2016-05-09 DIAGNOSIS — I120 Hypertensive chronic kidney disease with stage 5 chronic kidney disease or end stage renal disease: Secondary | ICD-10-CM | POA: Diagnosis present

## 2016-05-09 DIAGNOSIS — Z801 Family history of malignant neoplasm of trachea, bronchus and lung: Secondary | ICD-10-CM

## 2016-05-09 DIAGNOSIS — Z87891 Personal history of nicotine dependence: Secondary | ICD-10-CM

## 2016-05-09 DIAGNOSIS — Z992 Dependence on renal dialysis: Secondary | ICD-10-CM

## 2016-05-09 DIAGNOSIS — W19XXXA Unspecified fall, initial encounter: Secondary | ICD-10-CM

## 2016-05-09 DIAGNOSIS — Z9842 Cataract extraction status, left eye: Secondary | ICD-10-CM

## 2016-05-09 DIAGNOSIS — Z950 Presence of cardiac pacemaker: Secondary | ICD-10-CM

## 2016-05-09 DIAGNOSIS — G4733 Obstructive sleep apnea (adult) (pediatric): Secondary | ICD-10-CM | POA: Diagnosis present

## 2016-05-09 DIAGNOSIS — Z96652 Presence of left artificial knee joint: Secondary | ICD-10-CM | POA: Diagnosis present

## 2016-05-09 DIAGNOSIS — E876 Hypokalemia: Secondary | ICD-10-CM | POA: Diagnosis present

## 2016-05-09 DIAGNOSIS — Z9049 Acquired absence of other specified parts of digestive tract: Secondary | ICD-10-CM

## 2016-05-09 DIAGNOSIS — Z66 Do not resuscitate: Secondary | ICD-10-CM | POA: Diagnosis present

## 2016-05-09 DIAGNOSIS — Z818 Family history of other mental and behavioral disorders: Secondary | ICD-10-CM

## 2016-05-09 DIAGNOSIS — R079 Chest pain, unspecified: Secondary | ICD-10-CM | POA: Diagnosis not present

## 2016-05-09 DIAGNOSIS — Z808 Family history of malignant neoplasm of other organs or systems: Secondary | ICD-10-CM

## 2016-05-09 DIAGNOSIS — S72042A Displaced fracture of base of neck of left femur, initial encounter for closed fracture: Secondary | ICD-10-CM | POA: Diagnosis not present

## 2016-05-09 DIAGNOSIS — M25552 Pain in left hip: Secondary | ICD-10-CM | POA: Diagnosis not present

## 2016-05-09 DIAGNOSIS — Z825 Family history of asthma and other chronic lower respiratory diseases: Secondary | ICD-10-CM

## 2016-05-09 DIAGNOSIS — Z79899 Other long term (current) drug therapy: Secondary | ICD-10-CM

## 2016-05-09 DIAGNOSIS — M810 Age-related osteoporosis without current pathological fracture: Secondary | ICD-10-CM | POA: Diagnosis present

## 2016-05-09 DIAGNOSIS — S72009A Fracture of unspecified part of neck of unspecified femur, initial encounter for closed fracture: Secondary | ICD-10-CM | POA: Diagnosis present

## 2016-05-09 DIAGNOSIS — Z9841 Cataract extraction status, right eye: Secondary | ICD-10-CM

## 2016-05-09 DIAGNOSIS — W1830XA Fall on same level, unspecified, initial encounter: Secondary | ICD-10-CM | POA: Diagnosis present

## 2016-05-09 NOTE — ED Triage Notes (Signed)
Pt arrives via [facility] with c/o of left hip pain. Pt has right femur and left wrist injury from a previous fall in November. Pt denies LOC or head injury of any kind and is A and O x4.

## 2016-05-10 ENCOUNTER — Inpatient Hospital Stay: Payer: Medicare Other

## 2016-05-10 ENCOUNTER — Encounter
Admission: EM | Disposition: A | Discharge status: Skilled Nursing Facility | Payer: Self-pay | Source: Home / Self Care | Attending: Internal Medicine

## 2016-05-10 ENCOUNTER — Inpatient Hospital Stay: Payer: Medicare Other | Admitting: Anesthesiology

## 2016-05-10 ENCOUNTER — Emergency Department: Payer: Medicare Other

## 2016-05-10 DIAGNOSIS — S299XXA Unspecified injury of thorax, initial encounter: Secondary | ICD-10-CM | POA: Diagnosis not present

## 2016-05-10 DIAGNOSIS — Z801 Family history of malignant neoplasm of trachea, bronchus and lung: Secondary | ICD-10-CM | POA: Diagnosis not present

## 2016-05-10 DIAGNOSIS — I5022 Chronic systolic (congestive) heart failure: Secondary | ICD-10-CM | POA: Diagnosis not present

## 2016-05-10 DIAGNOSIS — G4733 Obstructive sleep apnea (adult) (pediatric): Secondary | ICD-10-CM | POA: Diagnosis present

## 2016-05-10 DIAGNOSIS — I2510 Atherosclerotic heart disease of native coronary artery without angina pectoris: Secondary | ICD-10-CM | POA: Diagnosis present

## 2016-05-10 DIAGNOSIS — Z885 Allergy status to narcotic agent status: Secondary | ICD-10-CM | POA: Diagnosis not present

## 2016-05-10 DIAGNOSIS — Z992 Dependence on renal dialysis: Secondary | ICD-10-CM | POA: Diagnosis not present

## 2016-05-10 DIAGNOSIS — S62202D Unspecified fracture of first metacarpal bone, left hand, subsequent encounter for fracture with routine healing: Secondary | ICD-10-CM | POA: Diagnosis not present

## 2016-05-10 DIAGNOSIS — I120 Hypertensive chronic kidney disease with stage 5 chronic kidney disease or end stage renal disease: Secondary | ICD-10-CM | POA: Diagnosis present

## 2016-05-10 DIAGNOSIS — M6281 Muscle weakness (generalized): Secondary | ICD-10-CM | POA: Diagnosis not present

## 2016-05-10 DIAGNOSIS — G4730 Sleep apnea, unspecified: Secondary | ICD-10-CM | POA: Diagnosis not present

## 2016-05-10 DIAGNOSIS — W19XXXD Unspecified fall, subsequent encounter: Secondary | ICD-10-CM | POA: Diagnosis not present

## 2016-05-10 DIAGNOSIS — S72002D Fracture of unspecified part of neck of left femur, subsequent encounter for closed fracture with routine healing: Secondary | ICD-10-CM | POA: Diagnosis not present

## 2016-05-10 DIAGNOSIS — R52 Pain, unspecified: Secondary | ICD-10-CM | POA: Diagnosis not present

## 2016-05-10 DIAGNOSIS — D631 Anemia in chronic kidney disease: Secondary | ICD-10-CM | POA: Diagnosis not present

## 2016-05-10 DIAGNOSIS — S72009A Fracture of unspecified part of neck of unspecified femur, initial encounter for closed fracture: Secondary | ICD-10-CM | POA: Diagnosis present

## 2016-05-10 DIAGNOSIS — Y92129 Unspecified place in nursing home as the place of occurrence of the external cause: Secondary | ICD-10-CM | POA: Diagnosis not present

## 2016-05-10 DIAGNOSIS — N186 End stage renal disease: Secondary | ICD-10-CM | POA: Diagnosis present

## 2016-05-10 DIAGNOSIS — Z01818 Encounter for other preprocedural examination: Secondary | ICD-10-CM | POA: Diagnosis not present

## 2016-05-10 DIAGNOSIS — Z96652 Presence of left artificial knee joint: Secondary | ICD-10-CM | POA: Diagnosis present

## 2016-05-10 DIAGNOSIS — I252 Old myocardial infarction: Secondary | ICD-10-CM | POA: Diagnosis not present

## 2016-05-10 DIAGNOSIS — Z808 Family history of malignant neoplasm of other organs or systems: Secondary | ICD-10-CM | POA: Diagnosis not present

## 2016-05-10 DIAGNOSIS — K589 Irritable bowel syndrome without diarrhea: Secondary | ICD-10-CM | POA: Diagnosis not present

## 2016-05-10 DIAGNOSIS — Z818 Family history of other mental and behavioral disorders: Secondary | ICD-10-CM | POA: Diagnosis not present

## 2016-05-10 DIAGNOSIS — R42 Dizziness and giddiness: Secondary | ICD-10-CM | POA: Diagnosis not present

## 2016-05-10 DIAGNOSIS — W1830XA Fall on same level, unspecified, initial encounter: Secondary | ICD-10-CM | POA: Diagnosis present

## 2016-05-10 DIAGNOSIS — N2581 Secondary hyperparathyroidism of renal origin: Secondary | ICD-10-CM | POA: Diagnosis present

## 2016-05-10 DIAGNOSIS — I509 Heart failure, unspecified: Secondary | ICD-10-CM | POA: Diagnosis not present

## 2016-05-10 DIAGNOSIS — E876 Hypokalemia: Secondary | ICD-10-CM | POA: Diagnosis not present

## 2016-05-10 DIAGNOSIS — E039 Hypothyroidism, unspecified: Secondary | ICD-10-CM | POA: Diagnosis not present

## 2016-05-10 DIAGNOSIS — Z888 Allergy status to other drugs, medicaments and biological substances status: Secondary | ICD-10-CM | POA: Diagnosis not present

## 2016-05-10 DIAGNOSIS — Z7401 Bed confinement status: Secondary | ICD-10-CM | POA: Diagnosis not present

## 2016-05-10 DIAGNOSIS — Z87891 Personal history of nicotine dependence: Secondary | ICD-10-CM | POA: Diagnosis not present

## 2016-05-10 DIAGNOSIS — F329 Major depressive disorder, single episode, unspecified: Secondary | ICD-10-CM | POA: Diagnosis not present

## 2016-05-10 DIAGNOSIS — S72142A Displaced intertrochanteric fracture of left femur, initial encounter for closed fracture: Secondary | ICD-10-CM | POA: Diagnosis not present

## 2016-05-10 DIAGNOSIS — R079 Chest pain, unspecified: Secondary | ICD-10-CM | POA: Diagnosis not present

## 2016-05-10 DIAGNOSIS — Z85828 Personal history of other malignant neoplasm of skin: Secondary | ICD-10-CM | POA: Diagnosis not present

## 2016-05-10 DIAGNOSIS — Z833 Family history of diabetes mellitus: Secondary | ICD-10-CM | POA: Diagnosis not present

## 2016-05-10 DIAGNOSIS — I1311 Hypertensive heart and chronic kidney disease without heart failure, with stage 5 chronic kidney disease, or end stage renal disease: Secondary | ICD-10-CM | POA: Diagnosis not present

## 2016-05-10 DIAGNOSIS — S72042A Displaced fracture of base of neck of left femur, initial encounter for closed fracture: Secondary | ICD-10-CM | POA: Diagnosis not present

## 2016-05-10 DIAGNOSIS — Z9049 Acquired absence of other specified parts of digestive tract: Secondary | ICD-10-CM | POA: Diagnosis not present

## 2016-05-10 DIAGNOSIS — M1990 Unspecified osteoarthritis, unspecified site: Secondary | ICD-10-CM | POA: Diagnosis present

## 2016-05-10 DIAGNOSIS — Z825 Family history of asthma and other chronic lower respiratory diseases: Secondary | ICD-10-CM | POA: Diagnosis not present

## 2016-05-10 DIAGNOSIS — R262 Difficulty in walking, not elsewhere classified: Secondary | ICD-10-CM | POA: Diagnosis not present

## 2016-05-10 DIAGNOSIS — Z9841 Cataract extraction status, right eye: Secondary | ICD-10-CM | POA: Diagnosis not present

## 2016-05-10 DIAGNOSIS — Z9842 Cataract extraction status, left eye: Secondary | ICD-10-CM | POA: Diagnosis not present

## 2016-05-10 DIAGNOSIS — M25551 Pain in right hip: Secondary | ICD-10-CM | POA: Diagnosis not present

## 2016-05-10 DIAGNOSIS — I132 Hypertensive heart and chronic kidney disease with heart failure and with stage 5 chronic kidney disease, or end stage renal disease: Secondary | ICD-10-CM | POA: Diagnosis not present

## 2016-05-10 DIAGNOSIS — S72002A Fracture of unspecified part of neck of left femur, initial encounter for closed fracture: Secondary | ICD-10-CM | POA: Diagnosis not present

## 2016-05-10 DIAGNOSIS — S72035A Nondisplaced midcervical fracture of left femur, initial encounter for closed fracture: Secondary | ICD-10-CM | POA: Diagnosis not present

## 2016-05-10 DIAGNOSIS — I429 Cardiomyopathy, unspecified: Secondary | ICD-10-CM | POA: Diagnosis present

## 2016-05-10 DIAGNOSIS — Z950 Presence of cardiac pacemaker: Secondary | ICD-10-CM | POA: Diagnosis not present

## 2016-05-10 DIAGNOSIS — Z9181 History of falling: Secondary | ICD-10-CM | POA: Diagnosis not present

## 2016-05-10 HISTORY — PX: HIP PINNING,CANNULATED: SHX1758

## 2016-05-10 LAB — COMPREHENSIVE METABOLIC PANEL
ALK PHOS: 150 U/L — AB (ref 38–126)
ALT: 24 U/L (ref 14–54)
AST: 39 U/L (ref 15–41)
Albumin: 3.9 g/dL (ref 3.5–5.0)
Anion gap: 10 (ref 5–15)
BILIRUBIN TOTAL: 0.8 mg/dL (ref 0.3–1.2)
BUN: 46 mg/dL — ABNORMAL HIGH (ref 6–20)
CO2: 26 mmol/L (ref 22–32)
CREATININE: 2.52 mg/dL — AB (ref 0.44–1.00)
Calcium: 9.6 mg/dL (ref 8.9–10.3)
Chloride: 100 mmol/L — ABNORMAL LOW (ref 101–111)
GFR calc non Af Amer: 16 mL/min — ABNORMAL LOW (ref >60)
GFR, EST AFRICAN AMERICAN: 19 mL/min — AB (ref >60)
GLUCOSE: 111 mg/dL — AB (ref 65–99)
Potassium: 3.6 mmol/L (ref 3.5–5.1)
SODIUM: 136 mmol/L (ref 135–145)
Total Protein: 7.2 g/dL (ref 6.5–8.1)

## 2016-05-10 LAB — BASIC METABOLIC PANEL
ANION GAP: 10 (ref 5–15)
BUN: 44 mg/dL — ABNORMAL HIGH (ref 6–20)
CALCIUM: 9.2 mg/dL (ref 8.9–10.3)
CO2: 24 mmol/L (ref 22–32)
Chloride: 101 mmol/L (ref 101–111)
Creatinine, Ser: 2.53 mg/dL — ABNORMAL HIGH (ref 0.44–1.00)
GFR, EST AFRICAN AMERICAN: 19 mL/min — AB (ref >60)
GFR, EST NON AFRICAN AMERICAN: 16 mL/min — AB (ref >60)
GLUCOSE: 112 mg/dL — AB (ref 65–99)
Potassium: 3.5 mmol/L (ref 3.5–5.1)
SODIUM: 135 mmol/L (ref 135–145)

## 2016-05-10 LAB — CBC
HCT: 36.9 % (ref 35.0–47.0)
HEMOGLOBIN: 12.4 g/dL (ref 12.0–16.0)
MCH: 28.8 pg (ref 26.0–34.0)
MCHC: 33.5 g/dL (ref 32.0–36.0)
MCV: 86.1 fL (ref 80.0–100.0)
PLATELETS: 234 10*3/uL (ref 150–440)
RBC: 4.29 MIL/uL (ref 3.80–5.20)
RDW: 16.1 % — ABNORMAL HIGH (ref 11.5–14.5)
WBC: 8 10*3/uL (ref 3.6–11.0)

## 2016-05-10 LAB — CBC WITH DIFFERENTIAL/PLATELET
BASOS PCT: 1 %
Basophils Absolute: 0.1 10*3/uL (ref 0–0.1)
EOS ABS: 0.3 10*3/uL (ref 0–0.7)
Eosinophils Relative: 5 %
HCT: 37.4 % (ref 35.0–47.0)
Hemoglobin: 12.5 g/dL (ref 12.0–16.0)
Lymphocytes Relative: 11 %
Lymphs Abs: 0.8 10*3/uL — ABNORMAL LOW (ref 1.0–3.6)
MCH: 28.3 pg (ref 26.0–34.0)
MCHC: 33.3 g/dL (ref 32.0–36.0)
MCV: 84.9 fL (ref 80.0–100.0)
MONO ABS: 0.5 10*3/uL (ref 0.2–0.9)
MONOS PCT: 6 %
NEUTROS PCT: 77 %
Neutro Abs: 5.7 10*3/uL (ref 1.4–6.5)
Platelets: 245 10*3/uL (ref 150–440)
RBC: 4.41 MIL/uL (ref 3.80–5.20)
RDW: 16.1 % — ABNORMAL HIGH (ref 11.5–14.5)
WBC: 7.3 10*3/uL (ref 3.6–11.0)

## 2016-05-10 LAB — TYPE AND SCREEN
ABO/RH(D): B POS
Antibody Screen: NEGATIVE

## 2016-05-10 LAB — SURGICAL PCR SCREEN
MRSA, PCR: NEGATIVE
STAPHYLOCOCCUS AUREUS: NEGATIVE

## 2016-05-10 LAB — PROTIME-INR
INR: 1.08
PROTHROMBIN TIME: 14 s (ref 11.4–15.2)

## 2016-05-10 LAB — APTT: aPTT: 37 seconds — ABNORMAL HIGH (ref 24–36)

## 2016-05-10 SURGERY — FIXATION, FEMUR, NECK, PERCUTANEOUS, USING SCREW
Anesthesia: Spinal | Laterality: Left

## 2016-05-10 MED ORDER — PHENOL 1.4 % MT LIQD
1.0000 | OROMUCOSAL | Status: DC | PRN
  Filled 2016-05-10: qty 177

## 2016-05-10 MED ORDER — MORPHINE SULFATE (PF) 2 MG/ML IV SOLN
1.0000 mg | Freq: Once | INTRAVENOUS | Status: AC
  Administered 2016-05-10: 1 mg via INTRAVENOUS

## 2016-05-10 MED ORDER — RENA-VITE PO TABS
1.0000 | ORAL_TABLET | Freq: Every day | ORAL | Status: DC
  Filled 2016-05-10: qty 1

## 2016-05-10 MED ORDER — ACETAMINOPHEN 650 MG RE SUPP: 650.0000 mg | Freq: Four times a day (QID) | RECTAL | Status: DC | PRN

## 2016-05-10 MED ORDER — FENTANYL CITRATE (PF) 100 MCG/2ML IJ SOLN
INTRAMUSCULAR | Status: AC
  Filled 2016-05-10: qty 2

## 2016-05-10 MED ORDER — ACETAMINOPHEN 325 MG PO TABS
650.0000 mg | ORAL_TABLET | Freq: Once | ORAL | Status: AC
  Administered 2016-05-10: 650 mg via ORAL

## 2016-05-10 MED ORDER — CEFAZOLIN SODIUM-DEXTROSE 2-4 GM/100ML-% IV SOLN: 2.0000 g | INTRAVENOUS | Status: AC

## 2016-05-10 MED ORDER — DIALYVITE 3000 3 MG PO TABS
1.0000 | ORAL_TABLET | Freq: Every day | ORAL | Status: DC
Start: 2016-05-10 — End: 2016-05-10
  Filled 2016-05-10: qty 1

## 2016-05-10 MED ORDER — ACETAMINOPHEN 325 MG PO TABS: 650.0000 mg | ORAL_TABLET | Freq: Four times a day (QID) | ORAL | Status: DC | PRN

## 2016-05-10 MED ORDER — ONDANSETRON HCL 4 MG/2ML IJ SOLN
INTRAMUSCULAR | Status: AC
Start: 2016-05-10 — End: 2016-05-10
  Filled 2016-05-10: qty 2

## 2016-05-10 MED ORDER — ONDANSETRON HCL 4 MG/2ML IJ SOLN: 4.0000 mg | Freq: Four times a day (QID) | INTRAMUSCULAR | Status: DC | PRN

## 2016-05-10 MED ORDER — BUPIVACAINE-EPINEPHRINE (PF) 0.5% -1:200000 IJ SOLN
INTRAMUSCULAR | Status: AC
  Filled 2016-05-10: qty 30

## 2016-05-10 MED ORDER — CLINDAMYCIN PHOSPHATE 600 MG/50ML IV SOLN
INTRAVENOUS | Status: AC
  Filled 2016-05-10: qty 50

## 2016-05-10 MED ORDER — SENNOSIDES-DOCUSATE SODIUM 8.6-50 MG PO TABS: 1.0000 | ORAL_TABLET | Freq: Two times a day (BID) | ORAL | Status: DC

## 2016-05-10 MED ORDER — MENTHOL 3 MG MT LOZG
1.0000 | LOZENGE | OROMUCOSAL | Status: DC | PRN
  Filled 2016-05-10: qty 9

## 2016-05-10 MED ORDER — ACETAMINOPHEN 325 MG PO TABS
650.0000 mg | ORAL_TABLET | Freq: Four times a day (QID) | ORAL | Status: DC | PRN
  Administered 2016-05-12 – 2016-05-13 (×3): 650 mg via ORAL
  Filled 2016-05-10 (×4): qty 2

## 2016-05-10 MED ORDER — METOPROLOL SUCCINATE ER 25 MG PO TB24
12.5000 mg | ORAL_TABLET | Freq: Every day | ORAL | Status: DC
  Administered 2016-05-10: 12.5 mg via ORAL
  Filled 2016-05-10: qty 1

## 2016-05-10 MED ORDER — MAGNESIUM HYDROXIDE 400 MG/5ML PO SUSP: 30.0000 mL | Freq: Every day | ORAL | Status: DC | PRN

## 2016-05-10 MED ORDER — MEPERIDINE HCL 50 MG/ML IJ SOLN: 6.2500 mg | INTRAMUSCULAR | Status: DC | PRN

## 2016-05-10 MED ORDER — ACETAMINOPHEN 500 MG PO TABS
1000.0000 mg | ORAL_TABLET | Freq: Four times a day (QID) | ORAL | Status: AC
  Administered 2016-05-10 – 2016-05-11 (×3): 1000 mg via ORAL
  Filled 2016-05-10 (×4): qty 2

## 2016-05-10 MED ORDER — BISACODYL 10 MG RE SUPP: 10.0000 mg | Freq: Every day | RECTAL | Status: DC | PRN

## 2016-05-10 MED ORDER — CALCITRIOL 0.25 MCG PO CAPS: 0.2500 ug | ORAL_CAPSULE | Freq: Every day | ORAL | Status: DC

## 2016-05-10 MED ORDER — POTASSIUM CHLORIDE CRYS ER 10 MEQ PO TBCR: 10.0000 meq | EXTENDED_RELEASE_TABLET | Freq: Once | ORAL | Status: DC

## 2016-05-10 MED ORDER — ONDANSETRON HCL 4 MG PO TABS: 4.0000 mg | ORAL_TABLET | Freq: Four times a day (QID) | ORAL | Status: DC | PRN

## 2016-05-10 MED ORDER — SODIUM CHLORIDE 0.45 % IV SOLN
INTRAVENOUS | Status: DC
  Administered 2016-05-10 – 2016-05-11 (×2): via INTRAVENOUS

## 2016-05-10 MED ORDER — PROPOFOL 10 MG/ML IV BOLUS
INTRAVENOUS | Status: DC | PRN
  Administered 2016-05-10: 30 mg via INTRAVENOUS

## 2016-05-10 MED ORDER — CLINDAMYCIN PHOSPHATE 600 MG/50ML IV SOLN: 600.0000 mg | INTRAVENOUS | Status: AC

## 2016-05-10 MED ORDER — ONDANSETRON HCL 4 MG/2ML IJ SOLN
INTRAMUSCULAR | Status: AC
  Filled 2016-05-10: qty 2

## 2016-05-10 MED ORDER — BUPIVACAINE-EPINEPHRINE (PF) 0.25% -1:200000 IJ SOLN
INTRAMUSCULAR | Status: AC
  Filled 2016-05-10: qty 30

## 2016-05-10 MED ORDER — CLINDAMYCIN PHOSPHATE 600 MG/50ML IV SOLN
INTRAVENOUS | Status: DC | PRN
  Administered 2016-05-10: 600 mg via INTRAVENOUS

## 2016-05-10 MED ORDER — FLEET ENEMA 7-19 GM/118ML RE ENEM: 1.0000 | ENEMA | Freq: Once | RECTAL | Status: DC | PRN

## 2016-05-10 MED ORDER — PROPOFOL 10 MG/ML IV BOLUS
INTRAVENOUS | Status: AC
  Filled 2016-05-10: qty 20

## 2016-05-10 MED ORDER — SODIUM CHLORIDE 0.9 % IV SOLN
INTRAVENOUS | Status: DC
  Administered 2016-05-10 (×2): via INTRAVENOUS

## 2016-05-10 MED ORDER — ASPIRIN EC 325 MG PO TBEC
325.0000 mg | DELAYED_RELEASE_TABLET | Freq: Every day | ORAL | Status: DC
  Administered 2016-05-12 – 2016-05-14 (×2): 325 mg via ORAL
  Filled 2016-05-10 (×3): qty 1

## 2016-05-10 MED ORDER — MORPHINE SULFATE (PF) 2 MG/ML IV SOLN
0.5000 mg | INTRAVENOUS | Status: DC | PRN
  Administered 2016-05-10 – 2016-05-11 (×3): 0.5 mg via INTRAVENOUS
  Filled 2016-05-10 (×3): qty 1

## 2016-05-10 MED ORDER — ACETAMINOPHEN 325 MG PO TABS
ORAL_TABLET | ORAL | Status: AC
  Filled 2016-05-10: qty 2

## 2016-05-10 MED ORDER — PROPOFOL 500 MG/50ML IV EMUL
INTRAVENOUS | Status: AC
  Filled 2016-05-10: qty 50

## 2016-05-10 MED ORDER — PROMETHAZINE HCL 25 MG/ML IJ SOLN: 6.2500 mg | INTRAMUSCULAR | Status: DC | PRN

## 2016-05-10 MED ORDER — ALUM & MAG HYDROXIDE-SIMETH 200-200-20 MG/5ML PO SUSP: 30.0000 mL | ORAL | Status: DC | PRN

## 2016-05-10 MED ORDER — METHOCARBAMOL 500 MG PO TABS
500.0000 mg | ORAL_TABLET | Freq: Two times a day (BID) | ORAL | Status: DC | PRN
  Administered 2016-05-10 – 2016-05-13 (×4): 500 mg via ORAL
  Filled 2016-05-10 (×4): qty 1

## 2016-05-10 MED ORDER — CLINDAMYCIN PHOSPHATE 600 MG/50ML IV SOLN
600.0000 mg | Freq: Three times a day (TID) | INTRAVENOUS | Status: AC
  Administered 2016-05-10 – 2016-05-11 (×3): 600 mg via INTRAVENOUS
  Filled 2016-05-10 (×3): qty 50

## 2016-05-10 MED ORDER — SODIUM CHLORIDE 0.9% FLUSH: 3.0000 mL | Freq: Two times a day (BID) | INTRAVENOUS | Status: DC

## 2016-05-10 MED ORDER — DIPHENOXYLATE-ATROPINE 2.5-0.025 MG PO TABS: 1.0000 | ORAL_TABLET | Freq: Four times a day (QID) | ORAL | Status: DC | PRN

## 2016-05-10 MED ORDER — CEFAZOLIN SODIUM-DEXTROSE 2-4 GM/100ML-% IV SOLN
2.0000 g | Freq: Four times a day (QID) | INTRAVENOUS | Status: DC
  Filled 2016-05-10 (×3): qty 100

## 2016-05-10 MED ORDER — TRAMADOL HCL 50 MG PO TABS: 50.0000 mg | ORAL_TABLET | Freq: Two times a day (BID) | ORAL | Status: DC | PRN

## 2016-05-10 MED ORDER — FERROUS SULFATE 325 (65 FE) MG PO TABS
325.0000 mg | ORAL_TABLET | Freq: Every day | ORAL | Status: DC
  Administered 2016-05-11 – 2016-05-14 (×3): 325 mg via ORAL
  Filled 2016-05-10 (×3): qty 1

## 2016-05-10 MED ORDER — FENTANYL CITRATE (PF) 100 MCG/2ML IJ SOLN: 25.0000 ug | INTRAMUSCULAR | Status: DC | PRN

## 2016-05-10 MED ORDER — PHENYLEPHRINE HCL 10 MG/ML IJ SOLN
INTRAMUSCULAR | Status: DC | PRN
  Administered 2016-05-10: 100 ug via INTRAVENOUS

## 2016-05-10 MED ORDER — METOCLOPRAMIDE HCL 5 MG/ML IJ SOLN: 5.0000 mg | Freq: Three times a day (TID) | INTRAMUSCULAR | Status: DC | PRN

## 2016-05-10 MED ORDER — ADULT MULTIVITAMIN W/MINERALS CH: 1.0000 | ORAL_TABLET | Freq: Every day | ORAL | Status: DC

## 2016-05-10 MED ORDER — GUAIFENESIN 100 MG/5ML PO SYRP
200.0000 mg | ORAL_SOLUTION | ORAL | Status: DC | PRN
  Filled 2016-05-10: qty 10

## 2016-05-10 MED ORDER — LEVOTHYROXINE SODIUM 25 MCG PO TABS
25.0000 ug | ORAL_TABLET | Freq: Every day | ORAL | Status: DC
Start: 2016-05-10 — End: 2016-05-10

## 2016-05-10 MED ORDER — METOCLOPRAMIDE HCL 10 MG PO TABS
5.0000 mg | ORAL_TABLET | Freq: Three times a day (TID) | ORAL | Status: DC | PRN
Start: 2016-05-10 — End: 2016-05-14

## 2016-05-10 MED ORDER — DEXTROSE 5 % IV SOLN
2.0000 g | Freq: Four times a day (QID) | INTRAVENOUS | Status: AC
  Administered 2016-05-10 – 2016-05-11 (×3): 2 g via INTRAVENOUS
  Filled 2016-05-10 (×3): qty 0

## 2016-05-10 MED ORDER — GLYCOPYRROLATE 0.2 MG/ML IJ SOLN
INTRAMUSCULAR | Status: AC
  Filled 2016-05-10: qty 1

## 2016-05-10 MED ORDER — VITAMIN D (ERGOCALCIFEROL) 1.25 MG (50000 UNIT) PO CAPS: 50000.0000 [IU] | ORAL_CAPSULE | ORAL | Status: DC

## 2016-05-10 MED ORDER — HYDROCODONE-ACETAMINOPHEN 5-325 MG PO TABS
1.0000 | ORAL_TABLET | Freq: Four times a day (QID) | ORAL | Status: DC | PRN
  Administered 2016-05-11: 1 via ORAL
  Filled 2016-05-10: qty 1
  Filled 2016-05-10: qty 2

## 2016-05-10 MED ORDER — ONDANSETRON HCL 4 MG/2ML IJ SOLN
4.0000 mg | Freq: Once | INTRAMUSCULAR | Status: AC
  Administered 2016-05-10: 4 mg via INTRAVENOUS

## 2016-05-10 MED ORDER — SODIUM CHLORIDE 0.45 % IV SOLN: INTRAVENOUS | Status: DC

## 2016-05-10 MED ORDER — SENNA 8.6 MG PO TABS
1.0000 | ORAL_TABLET | Freq: Two times a day (BID) | ORAL | Status: DC
  Administered 2016-05-10: 8.6 mg via ORAL
  Filled 2016-05-10 (×3): qty 1

## 2016-05-10 MED ORDER — SERTRALINE HCL 50 MG PO TABS: 50.0000 mg | ORAL_TABLET | Freq: Every day | ORAL | Status: DC

## 2016-05-10 MED ORDER — MORPHINE SULFATE (PF) 2 MG/ML IV SOLN
0.5000 mg | Freq: Once | INTRAVENOUS | Status: AC
  Administered 2016-05-10: 0.5 mg via INTRAVENOUS
  Filled 2016-05-10: qty 1

## 2016-05-10 MED ORDER — PROPOFOL 500 MG/50ML IV EMUL
INTRAVENOUS | Status: DC | PRN
  Administered 2016-05-10: 25 ug/kg/min via INTRAVENOUS

## 2016-05-10 MED ORDER — FENTANYL CITRATE (PF) 100 MCG/2ML IJ SOLN
INTRAMUSCULAR | Status: DC | PRN
  Administered 2016-05-10 (×2): 25 ug via INTRAVENOUS

## 2016-05-10 MED ORDER — ZOLPIDEM TARTRATE 5 MG PO TABS
5.0000 mg | ORAL_TABLET | Freq: Every evening | ORAL | Status: DC | PRN
  Filled 2016-05-10: qty 1

## 2016-05-10 MED ORDER — CEFAZOLIN SODIUM 1 G IJ SOLR
INTRAMUSCULAR | Status: DC | PRN
  Administered 2016-05-10: 2 g via INTRAMUSCULAR

## 2016-05-10 MED ORDER — BUPIVACAINE-EPINEPHRINE (PF) 0.5% -1:200000 IJ SOLN
INTRAMUSCULAR | Status: DC | PRN
Start: 2016-05-10 — End: 2016-05-10
  Administered 2016-05-10: 30 mL

## 2016-05-10 MED ORDER — BUPIVACAINE HCL (PF) 0.5 % IJ SOLN
INTRAMUSCULAR | Status: DC | PRN
  Administered 2016-05-10: 2.6 mL

## 2016-05-10 MED ORDER — MORPHINE SULFATE (PF) 2 MG/ML IV SOLN
0.5000 mg | INTRAVENOUS | Status: DC | PRN
  Administered 2016-05-10 (×2): 0.5 mg via INTRAVENOUS
  Filled 2016-05-10 (×3): qty 1

## 2016-05-10 SURGICAL SUPPLY — 28 items
BAG COUNTER SPONGE EZ (MISCELLANEOUS) ×2 IMPLANT
BIT DRILL CANN LRG QC 5X300 (BIT) ×3 IMPLANT
BLADE SURG SZ11 CARB STEEL (BLADE) ×3 IMPLANT
BNDG COHESIVE 4X5 TAN STRL (GAUZE/BANDAGES/DRESSINGS) ×3 IMPLANT
CHLORAPREP W/TINT 26ML (MISCELLANEOUS) ×3 IMPLANT
COUNTER SPONGE BAG EZ (MISCELLANEOUS) ×1
DRSG AQUACEL AG ADV 3.5X10 (GAUZE/BANDAGES/DRESSINGS) ×3 IMPLANT
DRSG OPSITE POSTOP 4X6 (GAUZE/BANDAGES/DRESSINGS) ×3 IMPLANT
GAUZE SPONGE 4X4 12PLY STRL (GAUZE/BANDAGES/DRESSINGS) ×3 IMPLANT
GLOVE BIO SURGEON STRL SZ8 (GLOVE) IMPLANT
GLOVE SURG ORTHO 8.5 STRL (GLOVE) ×3 IMPLANT
GLOVE SURG XRAY 8.5 LX (GLOVE) ×3 IMPLANT
GOWN STRL REUS W/ TWL LRG LVL3 (GOWN DISPOSABLE) ×1 IMPLANT
GOWN STRL REUS W/TWL LRG LVL3 (GOWN DISPOSABLE) ×2
GOWN STRL REUS W/TWL LRG LVL4 (GOWN DISPOSABLE) ×3 IMPLANT
GUIDEWIRE THREADED 2.8 (WIRE) ×9 IMPLANT
KIT RM TURNOVER STRD PROC AR (KITS) ×3 IMPLANT
MAT BLUE FLOOR 46X72 FLO (MISCELLANEOUS) ×3 IMPLANT
NEEDLE SPNL 18GX3.5 QUINCKE PK (NEEDLE) ×3 IMPLANT
NS IRRIG 500ML POUR BTL (IV SOLUTION) ×3 IMPLANT
PACK HIP COMPR (MISCELLANEOUS) ×3 IMPLANT
SCREW CANN 16 THRD/80 7.3 (Screw) ×3 IMPLANT
SCREW CANN 32 THRD/80 7.3 (Screw) ×6 IMPLANT
SCREW CANN 32 THRD/90 7.3 (Screw) ×3 IMPLANT
STRAP SAFETY BODY (MISCELLANEOUS) ×3 IMPLANT
SUT ETHILON 3 0 FSLX (SUTURE) ×3 IMPLANT
SYR 30ML LL (SYRINGE) ×3 IMPLANT
WASHER FOR 5.0 SCREWS (Washer) ×3 IMPLANT

## 2016-05-10 NOTE — Progress Notes (Signed)
Patient having muscle spasm. Notified Dr Earnestine Leys. Received order for Robaxin

## 2016-05-10 NOTE — Anesthesia Post-op Follow-up Note (Cosign Needed)
Anesthesia QCDR form completed.        

## 2016-05-10 NOTE — ED Provider Notes (Signed)
[facility] Emergency Department Provider Note  ____________________________________________   First MD Initiated Contact with Patient 05/10/16 0014     (approximate)  I have reviewed the triage vital signs and the nursing notes.   HISTORY  Chief Complaint Fall    HPI Sharon [name] is a 83 y.o. female with an extensive chronic medical history including multiple recent falls and a left femoral shaft fracture more than 6 months ago corrected by Dr. Marry Guan and a recent fall resulting in a left metacarpal fracture who presents by EMS after a mechanical fall and the resulting severe pain in her left hip.  She reports that it was a mechanical fall where she was trying to transfer out of her chair and she fell striking her left hip on the floor.  She did not strike her head and she denies any headache or neck pain.  She has mild pain in her left hip if she is being still but severe pain with any movement.  Her left leg is externally rotated and shortened.  She can wiggle her toes and has normal sensation throughout her leg.  She did not sustain any other injuries.     Past Medical History:  Diagnosis Date  . Arthritis   . CAD (coronary artery disease)   . Cancer ([place])    skin  . Cardiomyopathy ([place])   . Depression   . IBS (irritable bowel syndrome) 2010  . Kidney failure July 2012   Hemodialysis 3xweek  . Kidney failure   . Meniere disease   . Meniere's disease   . Myocardial infarction ([place])   . OSA (obstructive sleep apnea)    CPAP  . Peritoneal dialysis status ([place])   . Presence of permanent cardiac pacemaker     Patient Active Problem List   Diagnosis Date Noted  . Falls 04/27/2016  . Left hand pain 04/27/2016  . Head injury 04/27/2016  . ESRD on dialysis ([place]) 04/10/2016  . Pressure injury of skin 12/15/2015  . Closed left subtrochanteric femur fracture ([place]) 12/15/2015  . Femur fracture, left ([place]) 12/14/2015  . Meniere disease   . Loss  of weight 10/21/2015  . Skin tear of elbow without complication 25/00/3704  . Clinical depression 06/20/2015  . Combined fat and carbohydrate induced hyperlipemia 06/20/2015  . Dysphagia 05/24/2015  . Dyspnea 05/24/2015  . Imbalance 04/22/2015  . Electrical shock sensation in right posterior head 11/22/2014  . Fall at home 11/15/2014  . Expressive aphasia 10/04/2014  . Breathlessness on exertion 09/13/2014  . Chronic pain syndrome 05/22/2014  . Insomnia 03/13/2014  . Anxiety state 03/13/2014  . Arteriosclerosis of coronary artery 01/15/2014  . Chronic systolic heart failure ([place]) 01/15/2014  . Obstructive apnea 01/15/2014  . Basal cell carcinoma of neck 11/14/2013  . Degeneration of intervertebral disc of cervical region 11/07/2013  . Cervical radiculitis 10/16/2013  . Pelvic pain in female 09/12/2013  . Neck pain of over 3 months duration 09/12/2013  . Memory loss 09/12/2013  . Systolic heart failure, chronic ([place]) 05/12/2013  . Medicare annual wellness visit, subsequent 11/10/2012  . Sinus node dysfunction ([place]) 08/01/2012  . Low back pain 08/01/2012  . Cervical spine pain 05/02/2012  . Irritable bowel syndrome 11/02/2011  . Depression 04/01/2011  . Hypertension 04/01/2011  . Menopausal disorder 04/01/2011  . Screening for breast cancer 04/01/2011  . Chronic kidney disease, stage 5, kidney failure ([place]) 04/01/2011    Past Surgical History:  Procedure Laterality Date  . ABDOMINAL  [place]  . ANUS SURGERY  2010  . AV FISTULA PLACEMENT Right 04/15/2016   Procedure: ARTERIOVENOUS (AV) FISTULA CREATION ( RADIOCEPHALIC ) STAGE 2;  Surgeon: Algernon Huxley, MD;  Location: [facility] ORS;  Service: Vascular;  Laterality: Right;  . CATARACT EXTRACTION  2006, 2011  . CHOLECYSTECTOMY  01/2008  . EYE SURGERY     cataracts bilateral  . FEMUR IM NAIL Left 12/15/2015   Procedure: INTRAMEDULLARY (IM) RETROGRADE FEMORAL NAILING;  Surgeon: Oletta Cohn, DO;  Location: [facility] ORS;   Service: Orthopedics;  Laterality: Left;  . INSERTION OF DIALYSIS CATHETER  07/2010  . JOINT REPLACEMENT     left knee  . MINOR REMOVAL OF PERITONEAL DIALYSIS CATHETER  04/15/2016   Procedure: MINOR REMOVAL OF PERITONEAL DIALYSIS CATHETER;  Surgeon: Algernon Huxley, MD;  Location: [facility] ORS;  Service: Vascular;;  . PACEMAKER INSERTION  2006  . PERIPHERAL VASCULAR CATHETERIZATION N/A 12/18/2015   Procedure: Dialysis/Perma Catheter Insertion;  Surgeon: Katha Cabal, MD;  Location: [facility];  Service: Cardiovascular;  Laterality: N/A;  . TOTAL KNEE ARTHROPLASTY  2008   LEFT/Dr Calif    Prior to Admission medications   Medication Sig Start Date End Date Taking? Authorizing Provider  acetaminophen (TYLENOL) 325 MG tablet Take 650 mg by mouth every 4 (four) hours as needed for mild pain.    Historical Provider, MD  calcitRIOL (ROCALTROL) 0.25 MCG capsule Take 0.25 mcg by mouth daily.    Historical Provider, MD  diphenoxylate-atropine (LOMOTIL) 2.5-0.025 MG tablet TAKE 1 TABLET BY MOUTH FOUR TIMES DAILY AS NEEDED FOR DIARRHEA OR LOOSE STOOLS Patient taking differently: TAKE 1 TABLET BY MOUTH every 8 hours AS NEEDED FOR DIARRHEA OR LOOSE STOOLS 08/30/15   [name], MD  enoxaparin (LOVENOX) 30 MG/0.3ML injection Inject 0.3 mLs (30 mg total) into the skin daily. 12/17/15   [name], MD  folic acid-vitamin b complex-vitamin c-selenium-zinc (DIALYVITE) 3 MG TABS tablet Take 1 tablet by mouth daily. 03/31/16   [name], MD  guaifenesin (ROBITUSSIN) 100 MG/5ML syrup Take 200 mg by mouth every 4 (four) hours as needed for cough.    Historical Provider, MD  levothyroxine (SYNTHROID, LEVOTHROID) 25 MCG tablet Take 1 tablet (25 mcg total) by mouth daily. 10/25/15   [name], MD  lidocaine (LIDODERM) 5 % Place 1 patch onto the skin every 12 (twelve) hours. Remove & Discard patch within 12 hours or as directed by MD 04/28/16 04/28/17  Merlyn Lot, MD  meclizine (ANTIVERT) 25 MG  tablet Take 1 tablet (25 mg total) by mouth 3 (three) times daily as needed for dizziness. 04/22/15   [name], MD  metoprolol succinate (TOPROL XL) 25 MG 24 hr tablet TAKE 1/2 BY MOUTH DAILY 01/30/16   [name], MD  multivitamin-iron-minerals-folic acid [facility]) TABS tablet Take 1 tablet by mouth daily.    Historical Provider, MD  potassium chloride (K-DUR,KLOR-CON) 10 MEQ tablet Take 10 mEq by mouth once.    Historical Provider, MD  senna-docusate (SENOKOT-S) 8.6-50 MG tablet Take 1 tablet by mouth 2 (two) times daily. 12/17/15   Srikar Sudini, MD  sertraline (ZOLOFT) 50 MG tablet TAKE 3 TABLETS(150 MG) BY MOUTH DAILY 08/30/15   [name], MD  traMADol (ULTRAM) 50 MG tablet Take 1 tablet (50 mg total) by mouth every 6 (six) hours as needed. 04/15/16   Algernon Huxley, MD  Vitamin D, Ergocalciferol, (DRISDOL) 50000 units CAPS capsule Take 50,000 Units by  mouth every 7 (seven) days.  04/17/15   Historical Provider, MD    Allergies Statins; Effexor [venlafaxine]; and Codeine  Family History  Problem Relation Age of Onset  . Cancer Mother     ? ovarian - sarcoma  . Cancer Father     Skin cancer  . Diabetes Sister   . COPD Sister   . Depression Sister   . Cancer Sister     Lung - 62 yrs old    Social History Social History  Substance Use Topics  . Smoking status: Former Smoker    Quit date: 03/31/1948  . Smokeless tobacco: Never Used  . Alcohol use No    Review of Systems Constitutional: No fever/chills Eyes: No visual changes. ENT: No sore throat. Cardiovascular: Denies chest pain. Respiratory: Denies shortness of breath. Gastrointestinal: No abdominal pain.  No nausea, no vomiting.  No diarrhea.  No constipation. Genitourinary: Negative for dysuria. Musculoskeletal: Severe pain in left hip with movement Skin: Negative for rash. Neurological: Negative for headaches, focal weakness or numbness.  10-point ROS otherwise  negative.  ____________________________________________   PHYSICAL EXAM:  VITAL SIGNS: ED Triage Vitals  Enc Vitals Group     BP 05/09/16 2340 (!) 155/69     Pulse Rate 05/09/16 2340 69     Resp --      Temp 05/09/16 2340 98.2 F (36.8 C)     Temp Source 05/09/16 2340 Oral     SpO2 05/09/16 2340 100 %     Weight 05/09/16 2341 100 lb (45.4 kg)     Height 05/09/16 2341 5\' 4"  (1.626 m)     Head Circumference --      Peak Flow --      Pain Score 05/09/16 2340 10     Pain Loc --      Pain Edu? --      Excl. in [place]? --     Constitutional: Alert and oriented. Well appearing and in no acute distress. Eyes: Conjunctivae are normal. PERRL. EOMI. Head: Subacute contusion and swelling to right side of head from previous falls and no acute injuries Nose: No congestion/rhinnorhea. Mouth/Throat: Mucous membranes are moist. Neck: No stridor.  No meningeal signs.  No cervical spine tenderness to palpation. Cardiovascular: Normal rate, regular rhythm. Good peripheral circulation. Grossly normal heart sounds. Respiratory: Normal respiratory effort.  No retractions. Lungs CTAB. Gastrointestinal: Soft and nontender. No distention.  Musculoskeletal: Externally rotated and shortened left lower extremity, are vascularly intact, tenderness with any movement of the extremity.  Splint is in place on her left upper extremity from her previous metacarpal fracture Neurologic:  Normal speech and language. No gross focal neurologic deficits are appreciated.  Skin:  Skin is warm, dry and intact. No rash noted. Psychiatric: Mood and affect are normal. Speech and behavior are normal.  ____________________________________________   LABS (all labs ordered are listed, but only abnormal results are displayed)  Labs Reviewed  APTT - Abnormal; Notable for the following:       Result Value   aPTT 37 (*)    All other components within normal limits  COMPREHENSIVE METABOLIC PANEL - Abnormal; Notable for the  following:    Chloride 100 (*)    Glucose, Bld 111 (*)    BUN 46 (*)    Creatinine, Ser 2.52 (*)    Alkaline Phosphatase 150 (*)    GFR calc non Af Amer 16 (*)    GFR calc Af Amer 19 (*)    All other  components within normal limits  CBC WITH DIFFERENTIAL/PLATELET - Abnormal; Notable for the following:    RDW 16.1 (*)    Lymphs Abs 0.8 (*)    All other components within normal limits  PROTIME-INR  TYPE AND SCREEN   ____________________________________________  EKG  ED ECG REPORT I, Jacquita Mulhearn, the attending physician, personally viewed and interpreted this ECG.  Date: 05/10/2016 EKG Time: 02:08 Rate: 72 Rhythm: atrial-sensed ventricular paced rhythm QRS Axis: paced Intervals: atrial-sensed ventricular paced rhythm ST/T Wave abnormalities: Non-specific ST segment / T-wave changes, but no evidence of acute ischemia.  ____________________________________________  RADIOLOGY   Dg Chest 1 View  Result Date: 05/10/2016 CLINICAL DATA:  Pain after fall in November. EXAM: CHEST 1 VIEW COMPARISON:  01/14/2016 CXR FINDINGS: The heart is borderline enlarged. There is aortic atherosclerosis without aneurysm. There is a left subclavian pacemaker apparatus with right atrial and right ventricular leads in place. Emphysematous hyperinflation of the lungs without pneumonic consolidation, effusion or pneumothorax. Dialysis catheter right IJ approach terminates in the distal SVC- RA junction. No acute osseous abnormality. IMPRESSION: Emphysematous hyperinflation of the lungs. Stable cardiomegaly with aortic atherosclerosis. Electronically Signed   By: Ashley Royalty M.D.   On: 05/10/2016 00:47   Dg Hip Unilat W Or Wo Pelvis 2-3 Views Left  Result Date: 05/10/2016 CLINICAL DATA:  Left hip pain after falling EXAM: DG HIP (WITH OR WITHOUT PELVIS) 2-3V LEFT COMPARISON:  Pelvic radiograph 12/14/2015 FINDINGS: There is a fracture of the left femoral neck with associated foreshortening. The  femoroacetabular joint is not dislocated. There is an intramedullary rod within the left femur without associated abnormality. The IMPRESSION: Left femoral neck fracture. Electronically Signed   By: Ulyses Jarred M.D.   On: 05/10/2016 00:47   Dg Femur Min 2 Views Left  Result Date: 05/10/2016 CLINICAL DATA:  Left hip pain EXAM: LEFT FEMUR 2 VIEWS COMPARISON:  Fluoroscopic images of the left femur from 12/15/2015 FINDINGS: An acute, closed, dorsally angulated basicervical fracture of the proximal left femur is noted. No femoral head dislocation from the hip joint. Intact intramedullary nail fixation of the femoral shaft with intact total knee arthroplasty. IMPRESSION: Acute, closed, dorsally angulated basicervical fracture of the left femoral neck. Electronically Signed   By: Ashley Royalty M.D.   On: 05/10/2016 00:51    ____________________________________________   PROCEDURES  Critical Care performed: No   Procedure(s) performed:   Procedures   ____________________________________________   INITIAL IMPRESSION / ASSESSMENT AND PLAN / ED COURSE  Pertinent labs & imaging results that were available during my care of the patient were reviewed by me and considered in my medical decision making (see chart for details).  I initially called Dr. Marry Guan because she is a patient of his, but he explained that because this is a new injury and not related to the prior left femur fracture that I should call Dr. Sabra Heck.  I called and spoke with him at 1:25 AM and he is going to look at the x-rays and call me back.  I have ordered the standard medical workup for this patient and anticipate admission to the hospitalist for further management.  Of note the patient is adamant that she not receive any narcotics because she has bad altered mental status side effects that are long standing and she is requesting no narcotics at all.   Clinical Course as of May 11 239  Sun May 10, 2016  0147 I spoke by  phone with Dr. Sabra Heck who reviewed the imaging  and feels that he will be able to place percutaneous pins into the femur for fixation, probably later today.  We are awaiting lab results and then I will contact the hospitalist for admission  [CF]    Clinical Course User Index [CF] Hinda Kehr, MD    ____________________________________________  FINAL CLINICAL IMPRESSION(S) / ED DIAGNOSES  Final diagnoses:  Closed displaced fracture of left femoral neck ([place])  Hemodialysis patient ([place])     MEDICATIONS GIVEN DURING THIS VISIT:  Medications  ondansetron (ZOFRAN) 4 MG/2ML injection (not administered)  acetaminophen (TYLENOL) tablet 650 mg (650 mg Oral Given 05/10/16 0053)  morphine 2 MG/ML injection 0.5 mg (0.5 mg Intravenous Given 05/10/16 0203)     NEW OUTPATIENT MEDICATIONS STARTED DURING THIS VISIT:  New Prescriptions   No medications on file    Modified Medications   No medications on file    Discontinued Medications   No medications on file     Note:  This document was prepared using Dragon voice recognition software and may include unintentional dictation errors.    Hinda Kehr, MD 05/10/16 [phone]

## 2016-05-10 NOTE — Consult Note (Signed)
ORTHOPAEDIC CONSULTATION  REQUESTING PHYSICIAN: Loletha Grayer, MD  Chief Complaint: Left hip pain  HPI: Sharon [name] is a 83 y.o. female who complains of  Left hip pain following a fall at [employer] last night. Exam and x-rays in the ER last night reveal a slightly displaced left subcapital hip fracture with a retrograde IM femoral rod in place from surgery 6 months ago.  Given the patient's age, medical conditions including ESRD and dialysis, cardiomyopathy, history of MI, and Meniere's disease I feel that cannulated pinning of the fracture is the safest procedure to do at this time.  Hemiarthroplasty would require removal of the IM rod first and would be a much more invasive and risky procedure for her.  Risks and benefits have been discussed as well as post op protocol. Plan to proceed today as she is in as good condition as she will be for the next 48 hrs  Past Medical History:  Diagnosis Date  . Arthritis   . CAD (coronary artery disease)   . Cancer ([place])    skin  . Cardiomyopathy ([place])   . Depression   . IBS (irritable bowel syndrome) 2010  . Kidney failure July 2012   Hemodialysis 3xweek  . Kidney failure   . Meniere disease   . Meniere's disease   . Myocardial infarction ([place])   . OSA (obstructive sleep apnea)    CPAP  . Peritoneal dialysis status ([place])   . Presence of permanent cardiac pacemaker    Past Surgical History:  Procedure Laterality Date  . [place]  . ANUS SURGERY  2010  . AV FISTULA PLACEMENT Right 04/15/2016   Procedure: ARTERIOVENOUS (AV) FISTULA CREATION ( RADIOCEPHALIC ) STAGE 2;  Surgeon: Algernon Huxley, MD;  Location: [facility] ORS;  Service: Vascular;  Laterality: Right;  . CATARACT EXTRACTION  2006, 2011  . CHOLECYSTECTOMY  01/2008  . EYE SURGERY     cataracts bilateral  . FEMUR IM NAIL Left 12/15/2015   Procedure: INTRAMEDULLARY (IM) RETROGRADE FEMORAL NAILING;  Surgeon: Oletta Cohn, DO;  Location: [facility] ORS;  Service:  Orthopedics;  Laterality: Left;  . INSERTION OF DIALYSIS CATHETER  07/2010  . JOINT REPLACEMENT     left knee  . MINOR REMOVAL OF PERITONEAL DIALYSIS CATHETER  04/15/2016   Procedure: MINOR REMOVAL OF PERITONEAL DIALYSIS CATHETER;  Surgeon: Algernon Huxley, MD;  Location: [facility] ORS;  Service: Vascular;;  . PACEMAKER INSERTION  2006  . PERIPHERAL VASCULAR CATHETERIZATION N/A 12/18/2015   Procedure: Dialysis/Perma Catheter Insertion;  Surgeon: Katha Cabal, MD;  Location: [facility];  Service: Cardiovascular;  Laterality: N/A;  . TOTAL KNEE ARTHROPLASTY  2008   LEFT/Dr Calif   Social History   Social History  . Marital status: Widowed    Spouse name: N/A  . Number of children: 0  . Years of education: N/A   Occupational History  . Retired     Social History Main Topics  . Smoking status: Former Smoker    Quit date: 03/31/1948  . Smokeless tobacco: Never Used  . Alcohol use No  . Drug use: No  . Sexual activity: Not Asked   Other Topics Concern  . None   Social History Narrative   Lives in [place] alone. Widow 2012.      Regular Exercise -  Housework   Daily Caffeine Use:  1 - 2 cups coffee            Family History  Problem Relation Age of Onset  . Cancer Mother     ? ovarian - sarcoma  . Cancer Father     Skin cancer  . Diabetes Sister   . COPD Sister   . Depression Sister   . Cancer Sister     Lung - 46 yrs old   Allergies  Allergen Reactions  . Statins Other (See Comments)    Muscle weakness severe  . Effexor [Venlafaxine]   . Codeine Other (See Comments)    GI UPSET   Prior to Admission medications   Medication Sig Start Date End Date Taking? Authorizing Provider  acetaminophen (TYLENOL) 325 MG tablet Take 650 mg by mouth every 4 (four) hours as needed for mild pain.   Yes Historical Provider, MD  calcitRIOL (ROCALTROL) 0.25 MCG capsule Take 0.25 mcg by mouth daily.   Yes Historical Provider, MD  diphenoxylate-atropine (LOMOTIL) 2.5-0.025  MG tablet TAKE 1 TABLET BY MOUTH FOUR TIMES DAILY AS NEEDED FOR DIARRHEA OR LOOSE STOOLS Patient taking differently: TAKE 1 TABLET BY MOUTH every 8 hours AS NEEDED FOR DIARRHEA OR LOOSE STOOLS 08/30/15  Yes [name], MD  folic acid-vitamin b complex-vitamin c-selenium-zinc (DIALYVITE) 3 MG TABS tablet Take 1 tablet by mouth daily. 03/31/16  Yes [name], MD  guaifenesin (ROBITUSSIN) 100 MG/5ML syrup Take 200 mg by mouth every 4 (four) hours as needed for cough.   Yes Historical Provider, MD  levothyroxine (SYNTHROID, LEVOTHROID) 25 MCG tablet Take 1 tablet (25 mcg total) by mouth daily. 10/25/15  Yes [name], MD  lidocaine (LIDODERM) 5 % Place 1 patch onto the skin every 12 (twelve) hours. Remove & Discard patch within 12 hours or as directed by MD 04/28/16 04/28/17 Yes Merlyn Lot, MD  metoprolol succinate (TOPROL XL) 25 MG 24 hr tablet TAKE 1/2 BY MOUTH DAILY 01/30/16  Yes [name], MD  multivitamin-iron-minerals-folic acid [facility]) TABS tablet Take 1 tablet by mouth daily.   Yes Historical Provider, MD  potassium chloride (K-DUR,KLOR-CON) 10 MEQ tablet Take 10 mEq by mouth once.   Yes Historical Provider, MD  sertraline (ZOLOFT) 50 MG tablet TAKE 3 TABLETS(150 MG) BY MOUTH DAILY 08/30/15  Yes [name], MD  traMADol (ULTRAM) 50 MG tablet Take 1 tablet (50 mg total) by mouth every 6 (six) hours as needed. 04/15/16  Yes Algernon Huxley, MD  Vitamin D, Ergocalciferol, (DRISDOL) 50000 units CAPS capsule Take 50,000 Units by mouth every 7 (seven) days.  04/17/15  Yes Historical Provider, MD  senna-docusate (SENOKOT-S) 8.6-50 MG tablet Take 1 tablet by mouth 2 (two) times daily. Patient not taking: Reported on 05/10/2016 12/17/15   [name], MD   Dg Chest 1 View  Result Date: 05/10/2016 CLINICAL DATA:  Pain after fall in November. EXAM: CHEST 1 VIEW COMPARISON:  01/14/2016 CXR FINDINGS: The heart is borderline enlarged. There is aortic atherosclerosis without  aneurysm. There is a left subclavian pacemaker apparatus with right atrial and right ventricular leads in place. Emphysematous hyperinflation of the lungs without pneumonic consolidation, effusion or pneumothorax. Dialysis catheter right IJ approach terminates in the distal SVC- RA junction. No acute osseous abnormality. IMPRESSION: Emphysematous hyperinflation of the lungs. Stable cardiomegaly with aortic atherosclerosis. Electronically Signed   By: Ashley Royalty M.D.   On: 05/10/2016 00:47   Dg Hip Unilat W Or Wo Pelvis 2-3 Views Left  Result Date: 05/10/2016 CLINICAL DATA:  Left hip pain after falling EXAM: DG HIP (WITH OR WITHOUT PELVIS) 2-3V LEFT COMPARISON:  Pelvic radiograph 12/14/2015 FINDINGS: There is a fracture of the left femoral neck with associated foreshortening. The femoroacetabular joint is not dislocated. There is an intramedullary rod within the left femur without associated abnormality. The IMPRESSION: Left femoral neck fracture. Electronically Signed   By: Ulyses Jarred M.D.   On: 05/10/2016 00:47   Dg Femur Min 2 Views Left  Result Date: 05/10/2016 CLINICAL DATA:  Left hip pain EXAM: LEFT FEMUR 2 VIEWS COMPARISON:  Fluoroscopic images of the left femur from 12/15/2015 FINDINGS: An acute, closed, dorsally angulated basicervical fracture of the proximal left femur is noted. No femoral head dislocation from the hip joint. Intact intramedullary nail fixation of the femoral shaft with intact total knee arthroplasty. IMPRESSION: Acute, closed, dorsally angulated basicervical fracture of the left femoral neck. Electronically Signed   By: Ashley Royalty M.D.   On: 05/10/2016 00:51    Positive ROS: All other systems have been reviewed and were otherwise negative with the exception of those mentioned in the HPI and as above.  Physical Exam: General: Alert, no acute distress Cardiovascular: No pedal edema Respiratory: No cyanosis, no use of accessory musculature GI: No organomegaly, abdomen is  soft and non-tender Skin: No lesions in the area of chief complaint Neurologic: Sensation intact distally Psychiatric: Patient is competent for consent with normal mood and affect Lymphatic: No axillary or cervical lymphadenopathy  MUSCULOSKELETAL: Left leg painful on ROM.  CSM good.  Minimal rotation deformity.  Skin intact.   Assessment: Minimally displaced left femoral neck fracture  Plan: Cannulated pinning of hip    Park Breed, MD [phone]   05/10/2016 8:11 AM

## 2016-05-10 NOTE — Anesthesia Preprocedure Evaluation (Signed)
Anesthesia Evaluation  Patient identified by MRN, date of birth, ID band Patient awake    Reviewed: Allergy & Precautions, NPO status , Patient's Chart, lab work & pertinent test results  History of Anesthesia Complications Negative for: history of anesthetic complications  Airway Mallampati: II  TM Distance: >3 FB Neck ROM: Full    Dental no notable dental hx.    Pulmonary sleep apnea , neg COPD, former smoker,    breath sounds clear to auscultation- rhonchi (-) wheezing      Cardiovascular hypertension, Pt. on medications + CAD, + Past MI and +CHF  + pacemaker  Rhythm:Regular Rate:Normal - Systolic murmurs and - Diastolic murmurs Echo 0/63/01: SEVERE LV SYSTOLIC DYSFUNCTION (See above) WITH SEVERE LVH MODERATE VALVULAR REGURGITATION (See above) NO VALVULAR STENOSIS MILD PHTN   Neuro/Psych PSYCHIATRIC DISORDERS Anxiety Depression    GI/Hepatic negative GI ROS, Neg liver ROS,   Endo/Other  negative endocrine ROSneg diabetes  Renal/GU ESRF and DialysisRenal disease (last dialysis 05/08/16)     Musculoskeletal  (+) Arthritis ,   Abdominal (+) - obese,   Peds  Hematology negative hematology ROS (+)   Anesthesia Other Findings Past Medical History: No date: Arthritis No date: CAD (coronary artery disease) No date: Cancer ([place])     Comment: skin No date: Cardiomyopathy ([place]) No date: Depression 2010: IBS (irritable bowel syndrome) July 2012: Kidney failure     Comment: Hemodialysis 3xweek No date: Kidney failure No date: Meniere disease No date: Meniere's disease No date: Myocardial infarction ([place]) No date: OSA (obstructive sleep apnea)     Comment: CPAP No date: Peritoneal dialysis status ([place]) No date: Presence of permanent cardiac pacemaker   Reproductive/Obstetrics                             Anesthesia Physical Anesthesia Plan  ASA: IV  Anesthesia Plan: Spinal    Post-op Pain Management:    Induction:   Airway Management Planned: Natural Airway  Additional Equipment:   Intra-op Plan:   Post-operative Plan:   Informed Consent: I have reviewed the patients History and Physical, chart, labs and discussed the procedure including the risks, benefits and alternatives for the proposed anesthesia with the patient or authorized representative who has indicated his/her understanding and acceptance.   Dental advisory given  Plan Discussed with: Anesthesiologist and CRNA  Anesthesia Plan Comments:         Lab Results  Component Value Date   WBC 8.0 05/10/2016   HGB 12.4 05/10/2016   HCT 36.9 05/10/2016   MCV 86.1 05/10/2016   PLT 234 05/10/2016    Anesthesia Quick Evaluation

## 2016-05-10 NOTE — Progress Notes (Signed)
[facility]  ROUNDING NOTE   Subjective:   [name] admitted to [facility] on 05/09/2016 for Pain [R52] Fall [W19.XXXA] Hemodialysis patient [facility]) [Z99.2] Closed displaced fracture of left femoral neck ([place]) [S72.002A]   Surgery scheduled for later today.   Last hemodialysis Friday.   Objective:  Vital signs in last 24 hours:  Temp:  [98.2 F (36.8 C)] 98.2 F (36.8 C) (04/14 2340) Pulse Rate:  [68-74] 71 (04/15 0400) Resp:  [0-22] 0 (04/15 0400) BP: (139-158)/(65-80) 158/72 (04/15 0400) SpO2:  [95 %-100 %] 98 % (04/15 0400) Weight:  [45.4 kg (100 lb)] 45.4 kg (100 lb) (04/14 2341)  Weight change:  Filed Weights   05/09/16 2341  Weight: 45.4 kg (100 lb)    Intake/Output: I/O last 3 completed shifts: In: -  Out: 350 [Urine:350]   Intake/Output this shift:  No intake/output data recorded.  Physical Exam: General: NAD,   Head: Normocephalic, atraumatic. Moist oral mucosal membranes  Eyes: Anicteric, PERRL  Neck: Supple, trachea midline  Lungs:  Clear to auscultation  Heart: Regular rate and rhythm  Abdomen:  Soft, nontender,   Extremities: no peripheral edema, left hip pain  Neurologic: Nonfocal, moving all four extremities  Skin: No lesions  Access: RIJ permcath, maturing right forearm AVF    Basic Metabolic Panel:  Recent Labs Lab 05/10/16 0141 05/10/16 0548  NA 136 135  K 3.6 3.5  CL 100* 101  CO2 26 24  GLUCOSE 111* 112*  BUN 46* 44*  CREATININE 2.52* 2.53*  CALCIUM 9.6 9.2    Liver Function Tests:  Recent Labs Lab 05/10/16 0141  AST 39  ALT 24  ALKPHOS 150*  BILITOT 0.8  PROT 7.2  ALBUMIN 3.9   No results for input(s): LIPASE, AMYLASE in the last 168 hours. No results for input(s): AMMONIA in the last 168 hours.  CBC:  Recent Labs Lab 05/10/16 0141 05/10/16 0548  WBC 7.3 8.0  NEUTROABS 5.7  --   HGB 12.5 12.4  HCT 37.4 36.9  MCV 84.9 86.1  PLT 245 234    Cardiac Enzymes: No results for input(s):  CKTOTAL, CKMB, CKMBINDEX, TROPONINI in the last 168 hours.  BNP: Invalid input(s): POCBNP  CBG: No results for input(s): GLUCAP in the last 168 hours.  Microbiology: Results for orders placed or performed during the hospital encounter of 05/09/16  Surgical PCR screen     Status: None   Collection Time: 05/10/16  5:39 AM  Result Value Ref Range Status   MRSA, PCR NEGATIVE NEGATIVE Final   Staphylococcus aureus NEGATIVE NEGATIVE Final    Comment:        The Xpert SA Assay (FDA approved for NASAL specimens in patients over 54 years of age), is one component of a comprehensive surveillance program.  Test performance has been validated by [facility] for patients greater than or equal to 23 year old. It is not intended to diagnose infection nor to guide or monitor treatment.     Coagulation Studies:  Recent Labs  05/10/16 0141  LABPROT 14.0  INR 1.08    Urinalysis: No results for input(s): COLORURINE, LABSPEC, PHURINE, GLUCOSEU, HGBUR, BILIRUBINUR, KETONESUR, PROTEINUR, UROBILINOGEN, NITRITE, LEUKOCYTESUR in the last 72 hours.  Invalid input(s): APPERANCEUR    Imaging: Dg Chest 1 View  Result Date: 05/10/2016 CLINICAL DATA:  Pain after fall in November. EXAM: CHEST 1 VIEW COMPARISON:  01/14/2016 CXR FINDINGS: The heart is borderline enlarged. There is aortic atherosclerosis without aneurysm. There is a  left subclavian pacemaker apparatus with right atrial and right ventricular leads in place. Emphysematous hyperinflation of the lungs without pneumonic consolidation, effusion or pneumothorax. Dialysis catheter right IJ approach terminates in the distal SVC- RA junction. No acute osseous abnormality. IMPRESSION: Emphysematous hyperinflation of the lungs. Stable cardiomegaly with aortic atherosclerosis. Electronically Signed   By: Ashley Royalty M.D.   On: 05/10/2016 00:47   Dg Hip Unilat W Or Wo Pelvis 2-3 Views Left  Result Date: 05/10/2016 CLINICAL DATA:  Left hip pain  after falling EXAM: DG HIP (WITH OR WITHOUT PELVIS) 2-3V LEFT COMPARISON:  Pelvic radiograph 12/14/2015 FINDINGS: There is a fracture of the left femoral neck with associated foreshortening. The femoroacetabular joint is not dislocated. There is an intramedullary rod within the left femur without associated abnormality. The IMPRESSION: Left femoral neck fracture. Electronically Signed   By: Ulyses Jarred M.D.   On: 05/10/2016 00:47   Dg Femur Min 2 Views Left  Result Date: 05/10/2016 CLINICAL DATA:  Left hip pain EXAM: LEFT FEMUR 2 VIEWS COMPARISON:  Fluoroscopic images of the left femur from 12/15/2015 FINDINGS: An acute, closed, dorsally angulated basicervical fracture of the proximal left femur is noted. No femoral head dislocation from the hip joint. Intact intramedullary nail fixation of the femoral shaft with intact total knee arthroplasty. IMPRESSION: Acute, closed, dorsally angulated basicervical fracture of the left femoral neck. Electronically Signed   By: Ashley Royalty M.D.   On: 05/10/2016 00:51     Medications:   . sodium chloride 75 mL/hr at 05/10/16 0624   . calcitRIOL  0.25 mcg Oral Daily  . levothyroxine  25 mcg Oral QAC breakfast  . metoprolol succinate  12.5 mg Oral Daily  . multivitamin  1 tablet Oral Daily  . multivitamin with minerals  1 tablet Oral Daily  . senna-docusate  1 tablet Oral BID  . sertraline  50 mg Oral Daily  . sodium chloride flush  3 mL Intravenous Q12H  . Vitamin D (Ergocalciferol)  50,000 Units Oral Q7 days   acetaminophen **OR** acetaminophen, guaifenesin, morphine injection, ondansetron **OR** ondansetron (ZOFRAN) IV, traMADol  Assessment/ Plan:  [name] is a 83 y.o. white female with end stage renal disease on hemodialysis, hypertension, anemia of chronic kidney disease, depression, secondary hyperparathyroidism, hyperlipidemia  MWF [facility] RIJ permcath  1. End stage renal disease on hemodialysis: MWF. No indication for  dialysis today - Next treatment for Monday   2. Anemia of chronic kidney disease: hemoglobin 12.4 - outpatient epogen.  3. Secondary Hyperparathyroidism: outpatient PTH, calcium and phos at goal.  - calcitriol - not currently on binders  4. Hypertension: elevated due to pain.  - metoprolol  5. Closed left hip fracture: appreciate ortho input. Scheduled for hip surgery.    LOS: 0 Jaleel Allen 4/15/201811:57 AM

## 2016-05-10 NOTE — Progress Notes (Signed)
Patient ID: Sharon [name], female   DOB: 1926/06/10, 83 y.o.   MRN: [id number]  [employer] PROGRESS NOTE  JAPNEET STAGGS NFA:[id number] DOB: 12/26/1926 DOA: 05/09/2016 PCP: [name], MD  HPI/Subjective: Patient feeling severe pain and left hip. Lots of spasms. The traction slipped off her leg and the nurse had a reconnect. Just received some IV morphine. Patient stated she just had a fall.  Objective: Vitals:   05/10/16 0330 05/10/16 0400  BP: (!) 156/66 (!) 158/72  Pulse: 68 71  Resp: (!) 22 (!) 0  Temp:      Filed Weights   05/09/16 2341  Weight: 45.4 kg (100 lb)    ROS: Review of Systems  Constitutional: Negative for chills and fever.  Eyes: Negative for blurred vision.  Respiratory: Negative for cough and shortness of breath.   Cardiovascular: Negative for chest pain.  Gastrointestinal: Negative for abdominal pain, constipation, diarrhea, nausea and vomiting.  Genitourinary: Negative for dysuria.  Musculoskeletal: Positive for joint pain.  Neurological: Negative for dizziness and headaches.   Exam: Physical Exam  Constitutional: She is oriented to person, place, and time.  HENT:  Nose: No mucosal edema.  Mouth/Throat: No oropharyngeal exudate or posterior oropharyngeal edema.  Eyes: Conjunctivae, EOM and lids are normal. Pupils are equal, round, and reactive to light.  Neck: No JVD present. Carotid bruit is not present. No edema present. No thyroid mass and no thyromegaly present.  Cardiovascular: S1 normal and S2 normal.  Exam reveals no gallop.   No murmur heard. Pulses:      Dorsalis pedis pulses are 2+ on the right side, and 2+ on the left side.  Respiratory: No respiratory distress. She has no wheezes. She has no rhonchi. She has no rales.  GI: Soft. Bowel sounds are normal. There is no tenderness.  Musculoskeletal:       Right ankle: She exhibits no swelling.       Left ankle: She exhibits no swelling.  Lymphadenopathy:    She has no cervical  adenopathy.  Neurological: She is alert and oriented to person, place, and time. No cranial nerve deficit.  Skin: Skin is warm. No rash noted. Nails show no clubbing.  Psychiatric: She has a normal mood and affect.      Data Reviewed: Basic Metabolic Panel:  Recent Labs Lab 05/10/16 0141 05/10/16 0548  NA 136 135  K 3.6 3.5  CL 100* 101  CO2 26 24  GLUCOSE 111* 112*  BUN 46* 44*  CREATININE 2.52* 2.53*  CALCIUM 9.6 9.2   Liver Function Tests:  Recent Labs Lab 05/10/16 0141  AST 39  ALT 24  ALKPHOS 150*  BILITOT 0.8  PROT 7.2  ALBUMIN 3.9   CBC:  Recent Labs Lab 05/10/16 0141 05/10/16 0548  WBC 7.3 8.0  NEUTROABS 5.7  --   HGB 12.5 12.4  HCT 37.4 36.9  MCV 84.9 86.1  PLT 245 234     Recent Results (from the past 240 hour(s))  Surgical PCR screen     Status: None   Collection Time: 05/10/16  5:39 AM  Result Value Ref Range Status   MRSA, PCR NEGATIVE NEGATIVE Final   Staphylococcus aureus NEGATIVE NEGATIVE Final    Comment:        The Xpert SA Assay (FDA approved for NASAL specimens in patients over 72 years of age), is one component of a comprehensive surveillance program.  Test performance has been validated by [facility] for patients greater than or  equal to 50 year old. It is not intended to diagnose infection nor to guide or monitor treatment.      Studies: Dg Chest 1 View  Result Date: 05/10/2016 CLINICAL DATA:  Pain after fall in November. EXAM: CHEST 1 VIEW COMPARISON:  01/14/2016 CXR FINDINGS: The heart is borderline enlarged. There is aortic atherosclerosis without aneurysm. There is a left subclavian pacemaker apparatus with right atrial and right ventricular leads in place. Emphysematous hyperinflation of the lungs without pneumonic consolidation, effusion or pneumothorax. Dialysis catheter right IJ approach terminates in the distal SVC- RA junction. No acute osseous abnormality. IMPRESSION: Emphysematous hyperinflation of the  lungs. Stable cardiomegaly with aortic atherosclerosis. Electronically Signed   By: Ashley Royalty M.D.   On: 05/10/2016 00:47   Dg Hip Unilat W Or Wo Pelvis 2-3 Views Left  Result Date: 05/10/2016 CLINICAL DATA:  Left hip pain after falling EXAM: DG HIP (WITH OR WITHOUT PELVIS) 2-3V LEFT COMPARISON:  Pelvic radiograph 12/14/2015 FINDINGS: There is a fracture of the left femoral neck with associated foreshortening. The femoroacetabular joint is not dislocated. There is an intramedullary rod within the left femur without associated abnormality. The IMPRESSION: Left femoral neck fracture. Electronically Signed   By: Ulyses Jarred M.D.   On: 05/10/2016 00:47   Dg Femur Min 2 Views Left  Result Date: 05/10/2016 CLINICAL DATA:  Left hip pain EXAM: LEFT FEMUR 2 VIEWS COMPARISON:  Fluoroscopic images of the left femur from 12/15/2015 FINDINGS: An acute, closed, dorsally angulated basicervical fracture of the proximal left femur is noted. No femoral head dislocation from the hip joint. Intact intramedullary nail fixation of the femoral shaft with intact total knee arthroplasty. IMPRESSION: Acute, closed, dorsally angulated basicervical fracture of the left femoral neck. Electronically Signed   By: Ashley Royalty M.D.   On: 05/10/2016 00:51    Scheduled Meds: . calcitRIOL  0.25 mcg Oral Daily  . levothyroxine  25 mcg Oral QAC breakfast  . metoprolol succinate  12.5 mg Oral Daily  . multivitamin  1 tablet Oral Daily  . multivitamin with minerals  1 tablet Oral Daily  . senna-docusate  1 tablet Oral BID  . sertraline  50 mg Oral Daily  . sodium chloride flush  3 mL Intravenous Q12H  . Vitamin D (Ergocalciferol)  50,000 Units Oral Q7 days   Continuous Infusions: . sodium chloride 75 mL/hr at 05/10/16 0624    Assessment/Plan:  1. Preoperative consultation for closed left hip fracture initial encounter. No contraindications to surgery at this time. Patient is willing to undergo surgical and anesthesia risk  in order to walk again as soon as possible. The patient is also having severe fracture pain. 2. End-stage renal disease on dialysis. Decrease IV fluid hydration to 30 cc per hour now and discontinue as soon as possible after surgery. Normal dialysis days are Monday Wednesday and Friday. No need for dialysis currently. Patient recently had a fistula placed in the right arm. Patient also has access in her right chest. 3. History of cardiomyopathy and essential hypertension on metoprolol low dose. Dialysis to remove fluid 4. Depression on Zoloft 5. Hypothyroidism unspecified on levothyroxine 6. History of pacemaker 7. History of Mnire's disease  Code Status:     Code Status Orders        Start     Ordered   05/10/16 0506  Full code  Continuous     05/10/16 0506    Code Status History    Date Active Date Inactive Code Status  Order ID Comments User Context   12/15/2015  3:15 PM 12/19/2015  1:59 AM Full Code [id number]  Oletta Cohn, DO Inpatient   12/14/2015  4:13 PM 12/15/2015  3:15 PM Full Code [id number]  Bettey Costa, MD Inpatient    Advance Directive Documentation     Most Recent Value  Type of Advance Directive  Healthcare Power of Attorney  Pre-existing out of facility DNR order (yellow form or pink MOST form)  -  "MOST" Form in Place?  -     Family Communication: Brother at the bedside Disposition Plan: Rehabilitation 2-3 days postoperatively depending on clinical course  Consultants:  Orthopedic surgery  Time spent: 35 minutes  [place], [place]

## 2016-05-10 NOTE — Transfer of Care (Signed)
Immediate Anesthesia Transfer of Care Note  Patient: Sharon [name]  Procedure(s) Performed: Procedure(s): CANNULATED HIP PINNING (Left)  Patient Location: PACU  Anesthesia Type:Spinal  Level of Consciousness: awake, alert  and oriented  Airway & Oxygen Therapy: Patient Spontanous Breathing and Patient connected to nasal cannula oxygen  Post-op Assessment: Report given to RN and Post -op Vital signs reviewed and stable  Post vital signs: Reviewed and stable  Last Vitals:  Vitals:   05/10/16 0400 05/10/16 1315  BP: (!) 158/72 (!) 167/50  Pulse: 71 66  Resp: (!) 0 18  Temp:  36.7 C    Last Pain:  Vitals:   05/10/16 1315  TempSrc: Oral  PainSc:          Complications: No apparent anesthesia complications

## 2016-05-10 NOTE — Anesthesia Procedure Notes (Signed)
Spinal  Patient location during procedure: OR Start time: 05/10/2016 2:05 PM End time: 05/10/2016 2:16 PM Staffing Anesthesiologist: Emmie Niemann Performed: anesthesiologist  Preanesthetic Checklist Completed: patient identified, site marked, surgical consent, pre-op evaluation, timeout performed, IV checked, risks and benefits discussed and monitors and equipment checked Spinal Block Patient position: sitting Prep: ChloraPrep Patient monitoring: heart rate, continuous pulse ox, blood pressure and cardiac monitor Approach: midline Location: L4-5 Injection technique: single-shot Needle Needle type: Introducer and Pencil-Tip  Needle gauge: 24 G Needle length: 9 cm Additional Notes Negative paresthesia. Negative blood return. Positive free-flowing CSF. Expiration date of kit checked and confirmed. Patient tolerated procedure well, without complications.

## 2016-05-10 NOTE — ED Notes (Signed)
Pt comes from [facility].

## 2016-05-10 NOTE — Op Note (Signed)
05/09/2016 - 05/10/2016  3:10 PM  PATIENT:  Sharon [name]    PRE-OPERATIVE DIAGNOSIS:  Left femoral neck fracture  POST-OPERATIVE DIAGNOSIS:  Same  PROCEDURE:  CANNULATED HIP PINNING  SURGEON:  Park Breed, MD     ANESTHESIA:   General  PREOPERATIVE INDICATIONS:  Sharon [name] is a  83 y.o. female who fell and was found to have a diagnosis of Left femoral neck fracture who elected for surgical management.    The risks benefits and alternatives were discussed with the patient preoperatively including but not limited to the risks of infection, bleeding, nerve injury, cardiopulmonary complications, blood clots, malunion, nonunion, avascular necrosis, the need for revision surgery, the potential for conversion to hemiarthroplasty, among others, and the patient was willing to proceed.  OPERATIVE IMPLANTS: 7.3 mm cannulated screws x4  OPERATIVE FINDINGS: Clinical osteoporosis with weak bone, proximal femur  OPERATIVE PROCEDURE: The patient was brought to the operating room and placed in supine position. IV antibiotics were given. General anesthesia administered.  The patient was placed on the fracture table. The operative extremity was positioned, without any significant reduction maneuver and was prepped and draped in usual sterile fashion.  Time out was performed.  Small incision was made distal to the greater trochanter, and 4 guidewires were introduced into the head and neck. The lengths were measured. The reduction was slightly valgus, and near-anatomic. I opened the cortex with a cannulated drill, and then placed the screws into position. Satisfactory fixation was achieved.  The wounds were irrigated copiously, and repaired with  3-O Nylon with and sterile gauze. Sponge and needle count were correct.  There no complications and the patient tolerated the procedure well.  The patient will be touch down weightbearing as tolerated, and will be on Lovenox  for DVT  prophylaxis.   Park Breed, MD

## 2016-05-10 NOTE — H&P (Signed)
[facility] [place] [name]    MR#:  [id number]  DATE OF BIRTH:  Sep 27, 1926  DATE OF ADMISSION:  05/09/2016  PRIMARY CARE PHYSICIAN: [name], MD   REQUESTING/REFERRING PHYSICIAN:   CHIEF COMPLAINT:   Chief Complaint  Patient presents with  . Fall    HISTORY OF PRESENT ILLNESS: Sharon [name]  is a 83 y.o. female with a known history of Coronary artery disease, arthritis, skin cancer, cardiomyopathy, irritable bowel syndrome, end-stage renal disease on dialysis, sleep apnea, pacemaker presented to the emergency room with fall. Patient is a resident of [facility] facility. She had a left femur fracture repaired 6 months ago. And she has a left metacarpal fracture currently wearing a brace. She was trying to get out of the chair she lost balance and fell down. Patient landed on her left hip and she has pain in the left ear when she presented to the emergency room. The left hip pain is aching in nature 7 out of 10 on a scale of 1-10 patient was evaluated in the emergency room and imaging studies showed a femoral neck fracture in the left side. Orthopedic service was consulted by ER physician. Patient will be kept nothing by mouth for further surgery today. No complaints of any chest pain, shortness of breath. No history of any head injury and loss of consciousness.  PAST MEDICAL HISTORY:   Past Medical History:  Diagnosis Date  . Arthritis   . CAD (coronary artery disease)   . Cancer ([place])    skin  . Cardiomyopathy ([place])   . Depression   . IBS (irritable bowel syndrome) 2010  . Kidney failure July 2012   Hemodialysis 3xweek  . Kidney failure   . Meniere disease   . Meniere's disease   . Myocardial infarction ([place])   . OSA (obstructive sleep apnea)    CPAP  . Peritoneal dialysis status ([place])   . Presence of permanent cardiac pacemaker     PAST SURGICAL HISTORY: Past Surgical History:  Procedure Laterality  Date  . [place]  . ANUS SURGERY  2010  . AV FISTULA PLACEMENT Right 04/15/2016   Procedure: ARTERIOVENOUS (AV) FISTULA CREATION ( RADIOCEPHALIC ) STAGE 2;  Surgeon: Algernon Huxley, MD;  Location: [facility] ORS;  Service: Vascular;  Laterality: Right;  . CATARACT EXTRACTION  2006, 2011  . CHOLECYSTECTOMY  01/2008  . EYE SURGERY     cataracts bilateral  . FEMUR IM NAIL Left 12/15/2015   Procedure: INTRAMEDULLARY (IM) RETROGRADE FEMORAL NAILING;  Surgeon: Oletta Cohn, DO;  Location: [facility] ORS;  Service: Orthopedics;  Laterality: Left;  . INSERTION OF DIALYSIS CATHETER  07/2010  . JOINT REPLACEMENT     left knee  . MINOR REMOVAL OF PERITONEAL DIALYSIS CATHETER  04/15/2016   Procedure: MINOR REMOVAL OF PERITONEAL DIALYSIS CATHETER;  Surgeon: Algernon Huxley, MD;  Location: [facility] ORS;  Service: Vascular;;  . PACEMAKER INSERTION  2006  . PERIPHERAL VASCULAR CATHETERIZATION N/A 12/18/2015   Procedure: Dialysis/Perma Catheter Insertion;  Surgeon: Katha Cabal, MD;  Location: [facility];  Service: Cardiovascular;  Laterality: N/A;  . TOTAL KNEE ARTHROPLASTY  2008   LEFT/Dr Calif    SOCIAL HISTORY:  Social History  Substance Use Topics  . Smoking status: Former Smoker    Quit date: 03/31/1948  . Smokeless tobacco: Never Used  . Alcohol use No    FAMILY HISTORY:  Family History  Problem Relation Age of Onset  . Cancer Mother     ? ovarian - sarcoma  . Cancer Father     Skin cancer  . Diabetes Sister   . COPD Sister   . Depression Sister   . Cancer Sister     Lung - 23 yrs old    DRUG ALLERGIES:  Allergies  Allergen Reactions  . Statins Other (See Comments)    Muscle weakness severe  . Effexor [Venlafaxine]   . Codeine Other (See Comments)    GI UPSET    REVIEW OF SYSTEMS:   CONSTITUTIONAL: No fever, fatigue or weakness.  EYES: No blurred or double vision.  EARS, NOSE, AND THROAT: No tinnitus or ear pain.  RESPIRATORY: No cough, shortness of  breath, wheezing or hemoptysis.  CARDIOVASCULAR: No chest pain, orthopnea, edema.  GASTROINTESTINAL: No nausea, vomiting, diarrhea or abdominal pain.  GENITOURINARY: No dysuria, hematuria.  ENDOCRINE: No polyuria, nocturia,  HEMATOLOGY: No anemia, easy bruising or bleeding SKIN: No rash or lesion. MUSCULOSKELETAL: left hip pain.   NEUROLOGIC: No tingling, numbness, weakness.  PSYCHIATRY: No anxiety or depression.   MEDICATIONS AT HOME:  Prior to Admission medications   Medication Sig Start Date End Date Taking? Authorizing Provider  acetaminophen (TYLENOL) 325 MG tablet Take 650 mg by mouth every 4 (four) hours as needed for mild pain.    Historical Provider, MD  calcitRIOL (ROCALTROL) 0.25 MCG capsule Take 0.25 mcg by mouth daily.    Historical Provider, MD  diphenoxylate-atropine (LOMOTIL) 2.5-0.025 MG tablet TAKE 1 TABLET BY MOUTH FOUR TIMES DAILY AS NEEDED FOR DIARRHEA OR LOOSE STOOLS Patient taking differently: TAKE 1 TABLET BY MOUTH every 8 hours AS NEEDED FOR DIARRHEA OR LOOSE STOOLS 08/30/15   [name], MD  enoxaparin (LOVENOX) 30 MG/0.3ML injection Inject 0.3 mLs (30 mg total) into the skin daily. 12/17/15   [name], MD  folic acid-vitamin b complex-vitamin c-selenium-zinc (DIALYVITE) 3 MG TABS tablet Take 1 tablet by mouth daily. 03/31/16   [name], MD  guaifenesin (ROBITUSSIN) 100 MG/5ML syrup Take 200 mg by mouth every 4 (four) hours as needed for cough.    Historical Provider, MD  levothyroxine (SYNTHROID, LEVOTHROID) 25 MCG tablet Take 1 tablet (25 mcg total) by mouth daily. 10/25/15   [name], MD  lidocaine (LIDODERM) 5 % Place 1 patch onto the skin every 12 (twelve) hours. Remove & Discard patch within 12 hours or as directed by MD 04/28/16 04/28/17  Merlyn Lot, MD  meclizine (ANTIVERT) 25 MG tablet Take 1 tablet (25 mg total) by mouth 3 (three) times daily as needed for dizziness. 04/22/15   [name], MD  metoprolol succinate (TOPROL XL) 25  MG 24 hr tablet TAKE 1/2 BY MOUTH DAILY 01/30/16   [name], MD  multivitamin-iron-minerals-folic acid [facility]) TABS tablet Take 1 tablet by mouth daily.    Historical Provider, MD  potassium chloride (K-DUR,KLOR-CON) 10 MEQ tablet Take 10 mEq by mouth once.    Historical Provider, MD  senna-docusate (SENOKOT-S) 8.6-50 MG tablet Take 1 tablet by mouth 2 (two) times daily. 12/17/15   Srikar Sudini, MD  sertraline (ZOLOFT) 50 MG tablet TAKE 3 TABLETS(150 MG) BY MOUTH DAILY 08/30/15   [name], MD  traMADol (ULTRAM) 50 MG tablet Take 1 tablet (50 mg total) by mouth every 6 (six) hours as needed. 04/15/16   Algernon Huxley, MD  Vitamin D, Ergocalciferol, (DRISDOL) 50000 units CAPS capsule Take 50,000 Units by mouth every  7 (seven) days.  04/17/15   Historical Provider, MD      PHYSICAL EXAMINATION:   VITAL SIGNS: Blood pressure (!) 150/80, pulse 70, temperature 98.2 F (36.8 C), temperature source Oral, resp. rate 10, height 5\' 4"  (1.626 m), weight 45.4 kg (100 lb), SpO2 95 %.  GENERAL:  83 y.o.-year-old patient lying in the bed with no acute distress.  EYES: Pupils equal, round, reactive to light and accommodation. No scleral icterus. Extraocular muscles intact.  HEENT: Head atraumatic, normocephalic. Oropharynx and nasopharynx clear.  NECK:  Supple, no jugular venous distention. No thyroid enlargement, no tenderness.  LUNGS: Normal breath sounds bilaterally, no wheezing, rales,rhonchi or crepitation. No use of accessory muscles of respiration.  CARDIOVASCULAR: S1, S2 normal. No murmurs, rubs, or gallops.  ABDOMEN: Soft, nontender, nondistended. Bowel sounds present. No organomegaly or mass.  EXTREMITIES: No pedal edema, cyanosis, or clubbing. Left hip tenderness noted.  NEUROLOGIC: Cranial nerves II through XII are intact. Muscle strength 5/5 in all extremities. Sensation intact. Gait not checked.  PSYCHIATRIC: The patient is alert and oriented x 3.  SKIN: No obvious rash,  lesion, or ulcer.   LABORATORY PANEL:   CBC  Recent Labs Lab 05/10/16 0141  WBC 7.3  HGB 12.5  HCT 37.4  PLT 245  MCV 84.9  MCH 28.3  MCHC 33.3  RDW 16.1*  LYMPHSABS 0.8*  MONOABS 0.5  EOSABS 0.3  BASOSABS 0.1   ------------------------------------------------------------------------------------------------------------------  Chemistries   Recent Labs Lab 05/10/16 0141  NA 136  K 3.6  CL 100*  CO2 26  GLUCOSE 111*  BUN 46*  CREATININE 2.52*  CALCIUM 9.6  AST 39  ALT 24  ALKPHOS 150*  BILITOT 0.8   ------------------------------------------------------------------------------------------------------------------ estimated creatinine clearance is 11.3 mL/min (A) (by C-G formula based on SCr of 2.52 mg/dL (H)). ------------------------------------------------------------------------------------------------------------------ No results for input(s): TSH, T4TOTAL, T3FREE, THYROIDAB in the last 72 hours.  Invalid input(s): FREET3   Coagulation profile  Recent Labs Lab 05/10/16 0141  INR 1.08   ------------------------------------------------------------------------------------------------------------------- No results for input(s): DDIMER in the last 72 hours. -------------------------------------------------------------------------------------------------------------------  Cardiac Enzymes No results for input(s): CKMB, TROPONINI, MYOGLOBIN in the last 168 hours.  Invalid input(s): CK ------------------------------------------------------------------------------------------------------------------ Invalid input(s): POCBNP  ---------------------------------------------------------------------------------------------------------------  Urinalysis    Component Value Date/Time   COLORURINE YELLOW (A) 12/14/2015 1812   APPEARANCEUR CLEAR (A) 12/14/2015 1812   LABSPEC 1.012 12/14/2015 1812   PHURINE 6.0 12/14/2015 1812   GLUCOSEU NEGATIVE 12/14/2015  1812   HGBUR NEGATIVE 12/14/2015 1812   BILIRUBINUR NEGATIVE 12/14/2015 1812   BILIRUBINUR negative 09/12/2013 1251   KETONESUR NEGATIVE 12/14/2015 1812   PROTEINUR 100 (A) 12/14/2015 1812   UROBILINOGEN 0.2 09/12/2013 1251   NITRITE NEGATIVE 12/14/2015 1812   LEUKOCYTESUR NEGATIVE 12/14/2015 1812     RADIOLOGY: Dg Chest 1 View  Result Date: 05/10/2016 CLINICAL DATA:  Pain after fall in November. EXAM: CHEST 1 VIEW COMPARISON:  01/14/2016 CXR FINDINGS: The heart is borderline enlarged. There is aortic atherosclerosis without aneurysm. There is a left subclavian pacemaker apparatus with right atrial and right ventricular leads in place. Emphysematous hyperinflation of the lungs without pneumonic consolidation, effusion or pneumothorax. Dialysis catheter right IJ approach terminates in the distal SVC- RA junction. No acute osseous abnormality. IMPRESSION: Emphysematous hyperinflation of the lungs. Stable cardiomegaly with aortic atherosclerosis. Electronically Signed   By: Ashley Royalty M.D.   On: 05/10/2016 00:47   Dg Hip Unilat W Or Wo Pelvis 2-3 Views Left  Result Date: 05/10/2016 CLINICAL DATA:  Left  hip pain after falling EXAM: DG HIP (WITH OR WITHOUT PELVIS) 2-3V LEFT COMPARISON:  Pelvic radiograph 12/14/2015 FINDINGS: There is a fracture of the left femoral neck with associated foreshortening. The femoroacetabular joint is not dislocated. There is an intramedullary rod within the left femur without associated abnormality. The IMPRESSION: Left femoral neck fracture. Electronically Signed   By: Ulyses Jarred M.D.   On: 05/10/2016 00:47   Dg Femur Min 2 Views Left  Result Date: 05/10/2016 CLINICAL DATA:  Left hip pain EXAM: LEFT FEMUR 2 VIEWS COMPARISON:  Fluoroscopic images of the left femur from 12/15/2015 FINDINGS: An acute, closed, dorsally angulated basicervical fracture of the proximal left femur is noted. No femoral head dislocation from the hip joint. Intact intramedullary nail  fixation of the femoral shaft with intact total knee arthroplasty. IMPRESSION: Acute, closed, dorsally angulated basicervical fracture of the left femoral neck. Electronically Signed   By: Ashley Royalty M.D.   On: 05/10/2016 00:51    EKG: Orders placed or performed during the hospital encounter of 05/09/16  . ED EKG  . ED EKG    IMPRESSION AND PLAN: 83 year old female patient with history of coronary artery disease, cardiomyopathy, pacemaker, sleep apnea, end-stage renal disease on dialysis presented to the emergency room with fall and left hip pain. Admitting diagnosis 1. Left femoral neck fracture 2. Coronary artery disease 3. End-stage renal disease 4. Cardiomyopathy Treatment plan Admit patient to medical floor Orthopedic surgery consultation IV fluid hydration Keep patient nothing by mouth Avoid blood thinner medication Pain management with IV morphine DVT prophylaxis sequential compression devices to lower extremities Supportive care  All the records are reviewed and case discussed with ED provider. Management plans discussed with the patient, family and they are in agreement.  CODE STATUS:FULL CODE Code Status History    Date Active Date Inactive Code Status Order ID Comments User Context   12/15/2015  3:15 PM 12/19/2015  1:59 AM Full Code [id number]  Oletta Cohn, DO Inpatient   12/14/2015  4:13 PM 12/15/2015  3:15 PM Full Code [id number]  Bettey Costa, MD Inpatient    Advance Directive Documentation     Most Recent Value  Type of Advance Directive  Healthcare Power of Attorney  Pre-existing out of facility DNR order (yellow form or pink MOST form)  -  "MOST" Form in Place?  -       TOTAL TIME TAKING CARE OF THIS PATIENT: 51 minutes.    Saundra Shelling M.D on 05/10/2016 at 3:36 AM  Between 7am to 6pm - Pager - [phone]  After 6pm go to [email or web address] - password [email or web address] [facility]  [facility]  Office  [phone]  CC: Primary care physician; [name], MD

## 2016-05-10 NOTE — ED Notes (Signed)
Pt agrees to receive 0.5 mg of Morphine. Pt stated to this RN that she has done well on a Morphine drip.

## 2016-05-10 NOTE — H&P (Signed)
THE PATIENT WAS SEEN PRIOR TO SURGERY TODAY.  HISTORY, ALLERGIES, HOME MEDICATIONS AND OPERATIVE PROCEDURE WERE REVIEWED. RISKS AND BENEFITS OF SURGERY DISCUSSED WITH PATIENT AGAIN.  NO CHANGES FROM INITIAL HISTORY AND PHYSICAL NOTED.    

## 2016-05-11 ENCOUNTER — Encounter: Payer: Self-pay | Admitting: Specialist

## 2016-05-11 LAB — CBC
HCT: 35.2 % (ref 35.0–47.0)
HEMOGLOBIN: 11.6 g/dL — AB (ref 12.0–16.0)
MCH: 28.7 pg (ref 26.0–34.0)
MCHC: 33.1 g/dL (ref 32.0–36.0)
MCV: 86.8 fL (ref 80.0–100.0)
PLATELETS: 198 10*3/uL (ref 150–440)
RBC: 4.05 MIL/uL (ref 3.80–5.20)
RDW: 16.1 % — ABNORMAL HIGH (ref 11.5–14.5)
WBC: 7.2 10*3/uL (ref 3.6–11.0)

## 2016-05-11 MED ORDER — RENA-VITE PO TABS
1.0000 | ORAL_TABLET | Freq: Every day | ORAL | Status: DC
  Administered 2016-05-11 – 2016-05-13 (×3): 1 via ORAL
  Filled 2016-05-11 (×3): qty 1

## 2016-05-11 MED ORDER — LEVOTHYROXINE SODIUM 25 MCG PO TABS
25.0000 ug | ORAL_TABLET | Freq: Every day | ORAL | Status: DC
  Administered 2016-05-12 – 2016-05-14 (×3): 25 ug via ORAL
  Filled 2016-05-11 (×3): qty 1

## 2016-05-11 MED ORDER — SERTRALINE HCL 50 MG PO TABS
150.0000 mg | ORAL_TABLET | Freq: Every day | ORAL | Status: DC
  Administered 2016-05-11 – 2016-05-14 (×3): 150 mg via ORAL
  Filled 2016-05-11 (×3): qty 1

## 2016-05-11 MED FILL — Cefazolin Sodium For Inj 1 GM: INTRAMUSCULAR | Qty: 10 | Status: AC

## 2016-05-11 NOTE — Clinical Social Work Placement (Signed)
   CLINICAL SOCIAL WORK PLACEMENT  NOTE  Date:  05/11/2016  Patient Details  [name] MRN: [id number] Date of Birth: [birth year]  Clinical Social Work is seeking post-discharge placement for this patient at the [place] level of care (*CSW will initial, date and re-position this form in  chart as items are completed):  Yes   Patient/family provided with [place] Work Department's list of facilities offering this level of care within the geographic area requested by the patient (or if unable, by the patient's family).  Yes   Patient/family informed of their freedom to choose among providers that offer the needed level of care, that participate in Medicare, Medicaid or managed care program needed by the patient, have an available bed and are willing to accept the patient.  Yes   Patient/family informed of [facility]'s ownership interest in [facility] and [facility], as well as of the fact that they are under no obligation to receive care at these facilities.  PASRR submitted to EDS on 05/11/16     PASRR number received on       Existing PASRR number confirmed on       FL2 transmitted to all facilities in geographic area requested by pt/family on 05/11/16     FL2 transmitted to all facilities within larger geographic area on       Patient informed that his/her managed care company has contracts with or will negotiate with certain facilities, including the following:        Yes   Patient/family informed of bed offers received.  Patient chooses bed at  [facility] )     Physician recommends and patient chooses bed at      Patient to be transferred to   on  .  Patient to be transferred to facility by       Patient family notified on   of transfer.  Name of family member notified:        PHYSICIAN       Additional Comment:    _______________________________________________ Drina Jobst, Veronia Beets, LCSW 05/11/2016,  5:22 PM

## 2016-05-11 NOTE — Progress Notes (Signed)
Sharon [name] and Vascular Surgery  Daily Progress Note   Subjective  - 1 Day Post-Op  Asked to see the patient by Dr. Manuella Ghazi for evaluation of her AVF She is less than four weeks after right radiocephalic AVF creation. She is still using her Permcath.   She is not having arm or hand pain on the right (surgical side). Doing well s/p orthopedic surgery for hip fracture.   Objective Vitals:   05/11/16 1405 05/11/16 1435 05/11/16 1445 05/11/16 1450  BP: (!) 142/51 (!) 138/56 (!) 149/54 (!) 144/84  Pulse: 60 60 61 60  Resp: 10 17 16 16   Temp:   97.8 F (36.6 C)   TempSrc:   Oral   SpO2: 100% 100% 100% 100%  Weight:      Height:        Intake/Output Summary (Last 24 hours) at 05/11/16 1538 Last data filed at 05/11/16 1445  Gross per 24 hour  Intake           2147.5 ml  Output             1015 ml  Net           1132.5 ml    PULM  CTAB CV  RRR VASC  Good thrill in her right radiocephalic AVF Skin incision is well healed.  Laboratory CBC    Component Value Date/Time   WBC 7.2 05/11/2016 0356   HGB 11.6 (L) 05/11/2016 0356   HGB 12.0 04/24/2013 1557   HCT 35.2 05/11/2016 0356   HCT 35.6 04/24/2013 1557   PLT 198 05/11/2016 0356   PLT 216 04/24/2013 1557    BMET    Component Value Date/Time   NA 135 05/10/2016 0548   NA 142 04/24/2013 1557   K 3.5 05/10/2016 0548   K 3.8 04/24/2013 1557   CL 101 05/10/2016 0548   CL 106 04/24/2013 1557   CO2 24 05/10/2016 0548   CO2 32 04/24/2013 1557   GLUCOSE 112 (H) 05/10/2016 0548   GLUCOSE 109 (H) 04/24/2013 1557   BUN 44 (H) 05/10/2016 0548   BUN 40 (A) 09/03/2014   BUN 30 (H) 04/24/2013 1557   CREATININE 2.53 (H) 05/10/2016 0548   CREATININE 2.68 (H) 04/24/2013 1557   CALCIUM 9.2 05/10/2016 0548   CALCIUM 8.7 04/24/2013 1557   GFRNONAA 16 (L) 05/10/2016 0548   GFRNONAA 16 (L) 04/24/2013 1557   GFRAA 19 (L) 05/10/2016 0548   GFRAA 18 (L) 04/24/2013 1557    Assessment/Planning:    ESRD  AVF is maturing  nicely and wound is healed.  Still only 3-4 weeks after surgery  Needs another 2-3 weeks before the AVF can be used. Can come to office in 3 weeks to check AVF before use    Leotis Pain  05/11/2016, 3:38 PM

## 2016-05-11 NOTE — Progress Notes (Signed)
Hd start 

## 2016-05-11 NOTE — Anesthesia Postprocedure Evaluation (Addendum)
Anesthesia Post Note  Patient: Sharon [name]  Procedure(s) Performed: Procedure(s) (LRB): CANNULATED HIP PINNING (Left)  Patient location during evaluation: Nursing Unit Anesthesia Type: Spinal Level of consciousness: awake, awake and alert and oriented Pain management: pain level controlled Vital Signs Assessment: post-procedure vital signs reviewed and stable Respiratory status: spontaneous breathing Cardiovascular status: blood pressure returned to baseline Postop Assessment: no signs of nausea or vomiting, no backache and no headache Anesthetic complications: no     Last Vitals:  Vitals:   05/10/16 1902 05/10/16 2258  BP: (!) 131/50 (!) 109/47  Pulse: 69 67  Resp:  18  Temp:  36.6 C                    Audy Dauphine

## 2016-05-11 NOTE — Progress Notes (Signed)
Patient ID: Sharon [name], female   DOB: 01-Oct-1926, 83 y.o.   MRN: [id number]  [employer] [facility] PROGRESS NOTE  Sharon [name] KZS:[id number] DOB: August 19, 1926 DOA: 05/09/2016 PCP: Sharon Rumps, MD  HPI/Subjective: Waiting for dialysis.  Surgery went well,  postop day 1, she is requesting her fistula to be assessed by vascular surgery  Objective: Vitals:   05/10/16 1902 05/10/16 2258  BP: (!) 131/50 (!) 109/47  Pulse: 69 67  Resp:  18  Temp:  97.8 F (36.6 C)    Filed Weights   05/09/16 2341  Weight: 45.4 kg (100 lb)    ROS: Review of Systems  Constitutional: Negative for chills and fever.  Eyes: Negative for blurred vision.  Respiratory: Negative for cough and shortness of breath.   Cardiovascular: Negative for chest pain.  Gastrointestinal: Negative for abdominal pain, constipation, diarrhea, nausea and vomiting.  Genitourinary: Negative for dysuria.  Musculoskeletal: Positive for joint pain.  Neurological: Negative for dizziness and headaches.   Exam: Physical Exam  Constitutional: She is oriented to person, place, and time.  HENT:  Nose: No mucosal edema.  Mouth/Throat: No oropharyngeal exudate or posterior oropharyngeal edema.  Eyes: Conjunctivae, EOM and lids are normal. Pupils are equal, round, and reactive to light.  Neck: No JVD present. Carotid bruit is not present. No edema present. No thyroid mass and no thyromegaly present.  Cardiovascular: S1 normal and S2 normal.  Exam reveals no gallop.   No murmur heard. Pulses:      Dorsalis pedis pulses are 2+ on the right side, and 2+ on the left side.  Respiratory: No respiratory distress. She has no wheezes. She has no rhonchi. She has no rales.  GI: Soft. Bowel sounds are normal. There is no tenderness.  Musculoskeletal:       Right ankle: She exhibits no swelling.       Left ankle: She exhibits no swelling.  Lymphadenopathy:    She has no cervical adenopathy.  Neurological: She is alert and oriented to  person, place, and time. No cranial nerve deficit.  Skin: Skin is warm. No rash noted. Nails show no clubbing.  Psychiatric: She has a normal mood and affect.      Data Reviewed: Basic Metabolic Panel:  Recent Labs Lab 05/10/16 0141 05/10/16 0548  NA 136 135  K 3.6 3.5  CL 100* 101  CO2 26 24  GLUCOSE 111* 112*  BUN 46* 44*  CREATININE 2.52* 2.53*  CALCIUM 9.6 9.2   Liver Function Tests:  Recent Labs Lab 05/10/16 0141  AST 39  ALT 24  ALKPHOS 150*  BILITOT 0.8  PROT 7.2  ALBUMIN 3.9   CBC:  Recent Labs Lab 05/10/16 0141 05/10/16 0548 05/11/16 0356  WBC 7.3 8.0 7.2  NEUTROABS 5.7  --   --   HGB 12.5 12.4 11.6*  HCT 37.4 36.9 35.2  MCV 84.9 86.1 86.8  PLT 245 234 198     Recent Results (from the past 240 hour(s))  Surgical PCR screen     Status: None   Collection Time: 05/10/16  5:39 AM  Result Value Ref Range Status   MRSA, PCR NEGATIVE NEGATIVE Final   Staphylococcus aureus NEGATIVE NEGATIVE Final    Comment:        The Xpert SA Assay (FDA approved for NASAL specimens in patients over 81 years of age), is one component of a comprehensive surveillance program.  Test performance has been validated by [facility] for patients greater than or equal  to 42 year old. It is not intended to diagnose infection nor to guide or monitor treatment.      Studies: Dg Chest 1 View  Result Date: 05/10/2016 CLINICAL DATA:  Pain after fall in November. EXAM: CHEST 1 VIEW COMPARISON:  01/14/2016 CXR FINDINGS: The heart is borderline enlarged. There is aortic atherosclerosis without aneurysm. There is a left subclavian pacemaker apparatus with right atrial and right ventricular leads in place. Emphysematous hyperinflation of the lungs without pneumonic consolidation, effusion or pneumothorax. Dialysis catheter right IJ approach terminates in the distal SVC- RA junction. No acute osseous abnormality. IMPRESSION: Emphysematous hyperinflation of the lungs. Stable  cardiomegaly with aortic atherosclerosis. Electronically Signed   By: Sharon [name] M.D.   On: 05/10/2016 00:47   Dg Hip Operative Unilat W Or W/o Pelvis Left  Result Date: 05/10/2016 CLINICAL DATA:  Known left femoral fracture EXAM: OPERATIVE LEFT HIP WITH PELVIS COMPARISON:  None. FLUOROSCOPY TIME:  Radiation Exposure Index (as provided by the fluoroscopic device): 2.26 mGy If the device does not provide the exposure index: Fluoroscopy Time:  32 seconds Number of Acquired Images:  2 FINDINGS: Multiple fixation screws are now seen traversing the left femoral neck. The fracture fragments are in near anatomic alignment. Previously seen medullary rod in the proximal femur is again noted. IMPRESSION: Status post ORIF of proximal left femoral fracture. Electronically Signed   By: Sharon [name] M.D.   On: 05/10/2016 19:00   Dg Hip Unilat W Or Wo Pelvis 2-3 Views Left  Result Date: 05/10/2016 CLINICAL DATA:  Left hip pain after falling EXAM: DG HIP (WITH OR WITHOUT PELVIS) 2-3V LEFT COMPARISON:  Pelvic radiograph 12/14/2015 FINDINGS: There is a fracture of the left femoral neck with associated foreshortening. The femoroacetabular joint is not dislocated. There is an intramedullary rod within the left femur without associated abnormality. The IMPRESSION: Left femoral neck fracture. Electronically Signed   By: Sharon [name] M.D.   On: 05/10/2016 00:47   Dg Femur Min 2 Views Left  Result Date: 05/10/2016 CLINICAL DATA:  Left hip pain EXAM: LEFT FEMUR 2 VIEWS COMPARISON:  Fluoroscopic images of the left femur from 12/15/2015 FINDINGS: An acute, closed, dorsally angulated basicervical fracture of the proximal left femur is noted. No femoral head dislocation from the hip joint. Intact intramedullary nail fixation of the femoral shaft with intact total knee arthroplasty. IMPRESSION: Acute, closed, dorsally angulated basicervical fracture of the left femoral neck. Electronically Signed   By: Sharon [name] M.D.   On:  05/10/2016 00:51    Scheduled Meds: . acetaminophen  1,000 mg Oral Q6H  . aspirin EC  325 mg Oral Q breakfast  . clindamycin (CLEOCIN) IV  600 mg Intravenous Q8H  . ferrous sulfate  325 mg Oral Q breakfast  . potassium chloride  10 mEq Oral Once  . senna  1 tablet Oral BID   Continuous Infusions: . sodium chloride 75 mL/hr at 05/11/16 0550    Assessment/Plan:  1. left hip fracture pain: Status post hip pinning, postop day 1, further management per orthopedics 2. End-stage renal disease on dialysis: dialysis days are Monday Wednesday and Friday. Patient recently had a fistula placed in the right arm and is requesting reassessment - Consult Vascular surgery. Patient also has access in her right chest. 3. History of cardiomyopathy and essential hypertension on metoprolol low dose. Dialysis to remove fluid 4. Depression on Zoloft 5. Hypothyroidism: Continue levothyroxine 6. History of pacemaker 7. History of Mnire's disease  Code Status: FULL CODE  Family Communication: Brother at the bedside Disposition Plan: Rehabilitation 1-2 days postoperatively depending on clinical course  Consultants:  Orthopedic surgery  Time spent: 58 minutes  [place]

## 2016-05-11 NOTE — Progress Notes (Signed)
Patient taken to Dialysis via bed by [facility]

## 2016-05-11 NOTE — Progress Notes (Signed)
[facility]  ROUNDING NOTE   Subjective:   [name] admitted to [facility] on 05/09/2016 for Pain [R52] Fall [W19.XXXA] Hemodialysis patient [facility]) [Z99.2] Closed displaced fracture of left femoral neck ([place]) [S72.002A]   Patient underwent Left hip Pinning by Dr Sabra Heck on 4/15 Patient seen during dialysis Tolerating well f   HEMODIALYSIS FLOWSHEET:  Blood Flow Rate (mL/min): 400 mL/min Arterial Pressure (mmHg): -160 mmHg Venous Pressure (mmHg): 130 mmHg Transmembrane Pressure (mmHg): 40 mmHg Ultrafiltration Rate (mL/min): 500 mL/min Dialysate Flow Rate (mL/min): 600 ml/min Conductivity: Machine : 14 Conductivity: Machine : 14 Dialysis Fluid Bolus: Normal Saline Bolus Amount (mL): 250 mL Dialysate Change:  (3) Intra-Hemodialysis Comments: 1300. ended     Objective:  Vital signs in last 24 hours:  Temp:  [97.8 F (36.6 C)-98.6 F (37 C)] 97.8 F (36.6 C) (04/16 1445) Pulse Rate:  [60-74] 60 (04/16 1450) Resp:  [10-18] 16 (04/16 1450) BP: (109-151)/(47-84) 144/84 (04/16 1450) SpO2:  [94 %-100 %] 100 % (04/16 1450)  Weight change:  Filed Weights   05/09/16 2341  Weight: 45.4 kg (100 lb)    Intake/Output: I/O last 3 completed shifts: In: 1460 [P.O.:180; I.V.:1280] Out: 4 [Urine:765; Blood:5]   Intake/Output this shift:  Total I/O In: -  Out: 800 [Other:800]  Physical Exam: General: NAD,   Head: Normocephalic, atraumatic. Moist oral mucosal membranes  Eyes: Anicteric,   Neck: Supple, trachea midline  Lungs:  Clear to auscultation  Heart: Regular rate and rhythm  Abdomen:  Soft, nontender,   Extremities: no peripheral edema, left hip pain  Neurologic: Nonfocal, moving all four extremities  Skin: No lesions  Access: RIJ permcath, maturing right forearm AVF- developing (3/21) Dr Lucky Cowboy    Basic Metabolic Panel:  Recent Labs Lab 05/10/16 0141 05/10/16 0548  NA 136 135  K 3.6 3.5  CL 100* 101  CO2 26 24  GLUCOSE 111* 112*  BUN  46* 44*  CREATININE 2.52* 2.53*  CALCIUM 9.6 9.2    Liver Function Tests:  Recent Labs Lab 05/10/16 0141  AST 39  ALT 24  ALKPHOS 150*  BILITOT 0.8  PROT 7.2  ALBUMIN 3.9   No results for input(s): LIPASE, AMYLASE in the last 168 hours. No results for input(s): AMMONIA in the last 168 hours.  CBC:  Recent Labs Lab 05/10/16 0141 05/10/16 0548 05/11/16 0356  WBC 7.3 8.0 7.2  NEUTROABS 5.7  --   --   HGB 12.5 12.4 11.6*  HCT 37.4 36.9 35.2  MCV 84.9 86.1 86.8  PLT 245 234 198    Cardiac Enzymes: No results for input(s): CKTOTAL, CKMB, CKMBINDEX, TROPONINI in the last 168 hours.  BNP: Invalid input(s): POCBNP  CBG: No results for input(s): GLUCAP in the last 168 hours.  Microbiology: Results for orders placed or performed during the hospital encounter of 05/09/16  Surgical PCR screen     Status: None   Collection Time: 05/10/16  5:39 AM  Result Value Ref Range Status   MRSA, PCR NEGATIVE NEGATIVE Final   Staphylococcus aureus NEGATIVE NEGATIVE Final    Comment:        The Xpert SA Assay (FDA approved for NASAL specimens in patients over 52 years of age), is one component of a comprehensive surveillance program.  Test performance has been validated by [facility] for patients greater than or equal to 75 year old. It is not intended to diagnose infection nor to guide or monitor treatment.     Coagulation Studies:  Recent Labs  05/10/16 0141  LABPROT 14.0  INR 1.08    Urinalysis: No results for input(s): COLORURINE, LABSPEC, PHURINE, GLUCOSEU, HGBUR, BILIRUBINUR, KETONESUR, PROTEINUR, UROBILINOGEN, NITRITE, LEUKOCYTESUR in the last 72 hours.  Invalid input(s): APPERANCEUR    Imaging: Dg Chest 1 View  Result Date: 05/10/2016 CLINICAL DATA:  Pain after fall in November. EXAM: CHEST 1 VIEW COMPARISON:  01/14/2016 CXR FINDINGS: The heart is borderline enlarged. There is aortic atherosclerosis without aneurysm. There is a left subclavian  pacemaker apparatus with right atrial and right ventricular leads in place. Emphysematous hyperinflation of the lungs without pneumonic consolidation, effusion or pneumothorax. Dialysis catheter right IJ approach terminates in the distal SVC- RA junction. No acute osseous abnormality. IMPRESSION: Emphysematous hyperinflation of the lungs. Stable cardiomegaly with aortic atherosclerosis. Electronically Signed   By: Ashley Royalty M.D.   On: 05/10/2016 00:47   Dg Hip Operative Unilat W Or W/o Pelvis Left  Result Date: 05/10/2016 CLINICAL DATA:  Known left femoral fracture EXAM: OPERATIVE LEFT HIP WITH PELVIS COMPARISON:  None. FLUOROSCOPY TIME:  Radiation Exposure Index (as provided by the fluoroscopic device): 2.26 mGy If the device does not provide the exposure index: Fluoroscopy Time:  32 seconds Number of Acquired Images:  2 FINDINGS: Multiple fixation screws are now seen traversing the left femoral neck. The fracture fragments are in near anatomic alignment. Previously seen medullary rod in the proximal femur is again noted. IMPRESSION: Status post ORIF of proximal left femoral fracture. Electronically Signed   By: Inez Catalina M.D.   On: 05/10/2016 19:00   Dg Hip Unilat W Or Wo Pelvis 2-3 Views Left  Result Date: 05/10/2016 CLINICAL DATA:  Left hip pain after falling EXAM: DG HIP (WITH OR WITHOUT PELVIS) 2-3V LEFT COMPARISON:  Pelvic radiograph 12/14/2015 FINDINGS: There is a fracture of the left femoral neck with associated foreshortening. The femoroacetabular joint is not dislocated. There is an intramedullary rod within the left femur without associated abnormality. The IMPRESSION: Left femoral neck fracture. Electronically Signed   By: Ulyses Jarred M.D.   On: 05/10/2016 00:47   Dg Femur Min 2 Views Left  Result Date: 05/10/2016 CLINICAL DATA:  Left hip pain EXAM: LEFT FEMUR 2 VIEWS COMPARISON:  Fluoroscopic images of the left femur from 12/15/2015 FINDINGS: An acute, closed, dorsally angulated  basicervical fracture of the proximal left femur is noted. No femoral head dislocation from the hip joint. Intact intramedullary nail fixation of the femoral shaft with intact total knee arthroplasty. IMPRESSION: Acute, closed, dorsally angulated basicervical fracture of the left femoral neck. Electronically Signed   By: Ashley Royalty M.D.   On: 05/10/2016 00:51     Medications:   . sodium chloride Stopped (05/11/16 1129)   . acetaminophen  1,000 mg Oral Q6H  . aspirin EC  325 mg Oral Q breakfast  . clindamycin (CLEOCIN) IV  600 mg Intravenous Q8H  . ferrous sulfate  325 mg Oral Q breakfast  . multivitamin  1 tablet Oral QHS  . potassium chloride  10 mEq Oral Once  . senna  1 tablet Oral BID   acetaminophen **OR** acetaminophen, alum & mag hydroxide-simeth, bisacodyl, diphenoxylate-atropine, HYDROcodone-acetaminophen, magnesium hydroxide, menthol-cetylpyridinium **OR** phenol, methocarbamol, metoCLOPramide **OR** metoCLOPramide (REGLAN) injection, morphine injection, ondansetron **OR** ondansetron (ZOFRAN) IV, sodium phosphate, zolpidem  Assessment/ Plan:  [name] is a 83 y.o. white female with end stage renal disease on hemodialysis, hypertension, anemia of chronic kidney disease, depression, secondary hyperparathyroidism, hyperlipidemia  MWF [facility] RIJ permcath  1. End stage renal disease on hemodialysis: MWF.  Patient seen during dialysis Tolerating well   2. Anemia of chronic kidney disease: hemoglobin 12.4 - outpatient epogen.  3. Secondary Hyperparathyroidism: outpatient PTH, calcium and phos at goal.  - calcitriol - not currently on binders  4. Closed left hip fracture:  Underwent pinning on 05/10/16    LOS: 1 Quintus Premo 4/16/20183:18 PM

## 2016-05-11 NOTE — Progress Notes (Signed)
Post hd vitals 

## 2016-05-11 NOTE — Progress Notes (Signed)
  End of hd 

## 2016-05-11 NOTE — Progress Notes (Signed)
PT Cancellation Note  Patient Details [name] MRN: [id number] DOB: 04-26-1926   Cancelled Treatment:    Reason Eval/Treat Not Completed: Patient at procedure or test/unavailable; Pt at HD in the PM, will attempt to treat pt at another date/time as medically appropriate.    Blanche [place] Linn Clavin 05/11/2016, 2:43 PM

## 2016-05-11 NOTE — Progress Notes (Signed)
OT Cancellation Note  Patient Details Name: MIKALYN HERMIDA MRN: [id number] DOB: 1926/10/27   Cancelled Treatment:    Reason Eval/Treat Not Completed: Other (comment). On 3rd attempt to see, pt with social work. Will continue to monitor and re-attempt OT evaluation at later date/time as pt is available.  Jeni Salles, MPH, MS, OTR/L ascom [phone] 05/11/16, 4:20 PM

## 2016-05-11 NOTE — Evaluation (Addendum)
Physical Therapy Evaluation Patient Details [name] MRN: [id number] DOB: 1927-01-04 Today's Date: 05/11/2016   History of Present Illness  Pt is an 83 y.o. female who complains of  Left hip pain following a fall at [facility]. Exam and x-rays in the ER revealed a slightly displaced left subcapital hip fracture with a retrograde IM femoral rod in place from surgery 6 months ago.  Pt is now s/p cannulated pinning to L hip.  Pt also noted to have L hand metacarpal Fx with brace in place and a fistula to RUE for HD secondary to ESRD.     Clinical Impression  Pt presents with deficits in strength, transfers, mobility, gait, balance, and activity tolerance.  Pt required +2 Max A for all bed mobility tasks and was limited by weakness and LLE pain with movement.  Pt required +2 Mod A for sit to/from stand transfers with max verbal and tactile cues for sequencing to maintain LLE TTWB status and for proper use of LUE PW.  Pt given max verbal cues on sequencing for attempts to perform hop-to gait but was unable to lift RLE from floor.  At end of session pt mentioned that had a previous fall "a couple of weeks ago" that resulted in a RLE injury and was told to "hold off" on rehab therapy for "a couple of weeks".  Spoke to attending physician and reviewed chart but was not able to find record of that injury.  Spoke to Brewing technologist at [facility] who confirmed there was a RLE injury and MD noted pt was to be WBAT to RLE but was to hold physical therapy for 2 weeks starting 04/30/16.  Contact was unsure of who the MD was who placed that order.  [facility] called and message left with nursing to assist in following up on status of RLE and any related restrictions related to rehab.  Pt will benefit from PT services to address above deficits for decreased caregiver assistance and will require SNF setting at discharge secondary to the extent of her functional deficits to ensure safe progression  towards return to prior level of function.  Addendum 05/11/16 2:39 pm: Spoke to Dr. Marry Guan.  Per Dr. Marry Guan pt has a small non-displaced femur fracture to the R greater trochanter.  Pt is WBAT to the RLE with the only restriction being to avoid aggressive R hip abduction exercise.        Follow Up Recommendations SNF    Equipment Recommendations  None recommended by PT    Recommendations for Other Services       Precautions / Restrictions Precautions Precautions: Fall Restrictions Weight Bearing Restrictions: Yes RLE Weight Bearing: Weight bearing as tolerated (No aggressive hip abd exercise secondary to non-displaced Fx to greater trochanter per Dr. Marry Guan ) LLE Weight Bearing: Touchdown weight bearing Other Position/Activity Restrictions: Metacarpal Fx to L hand in brace, unkown WB status, use PW      Mobility  Bed Mobility Overal bed mobility: Needs Assistance Bed Mobility: Supine to Sit;Sit to Supine     Supine to sit: Max assist;+2 for physical assistance Sit to supine: Max assist;+2 for physical assistance   General bed mobility comments: Limited by weakness and LLE pain with movement  Transfers Overall transfer level: Needs assistance Equipment used: Left platform walker Transfers: Sit to/from Stand Sit to Stand: Mod assist;+2 physical assistance         General transfer comment: Pt's L foot on top of PT's foot to ensure  WB status maintained with use of LUE PW  Ambulation/Gait             General Gait Details: Pt unable to lift RLE from floor  Stairs            Wheelchair Mobility    Modified Rankin (Stroke Patients Only)       Balance Overall balance assessment: Needs assistance Sitting-balance support: No upper extremity supported Sitting balance-Leahy Scale: Fair     Standing balance support: Bilateral upper extremity supported Standing balance-Leahy Scale: Fair                               Pertinent Vitals/Pain  Pain Assessment: 0-10 Pain Score: 4  Pain Location: L hip Pain Descriptors / Indicators: Aching Pain Intervention(s): Monitored during session;Limited activity within patient's tolerance    Home Living Family/patient expects to be discharged to:: [place]: [place] Rile - 4 wheels;Walker - 2 wheels      Prior Function Level of Independence: Needs assistance   Gait / Transfers Assistance Needed: Staff SBA during amb with rollator or RW     Comments: Pt reports around 3 falls in last year with multiple BLE injuries     Hand Dominance   Dominant Hand: Right    Extremity/Trunk Assessment   Upper Extremity Assessment Upper Extremity Assessment: Defer to OT evaluation    Lower Extremity Assessment Lower Extremity Assessment: Generalized weakness       Communication   Communication: HOH  Cognition Arousal/Alertness: Awake/alert Behavior During Therapy: WFL for tasks assessed/performed Overall Cognitive Status: Within Functional Limits for tasks assessed                                        General Comments      Exercises Total Joint Exercises Ankle Circles/Pumps: AROM;Both;5 reps;10 reps Quad Sets: Strengthening;Both;10 reps Gluteal Sets: Strengthening;Both;10 reps Hip ABduction/ADduction: AAROM;AROM;Both;10 reps;Other (comment) (Gentle AAROM on LLE, AROM on R) Straight Leg Raises: AROM;AAROM;Both;10 reps;Other (comment) (Gentle AAROM on LLE, AROM on R) Long Arc Quad: AAROM;Left;5 reps Knee Flexion: AAROM;Left;5 reps   Assessment/Plan    PT Assessment Patient needs continued PT services  PT Problem List Decreased strength;Decreased activity tolerance;Decreased balance;Decreased knowledge of use of DME;Decreased mobility       PT Treatment Interventions DME instruction;Gait training;Functional mobility training;Neuromuscular re-education;Balance training;Therapeutic exercise;Therapeutic  activities;Patient/family education    PT Goals (Current goals can be found in the Care Plan section)  Acute Rehab PT Goals Patient Stated Goal: To be able to return to her home with her nephew PT Goal Formulation: With patient Time For Goal Achievement: 05/24/16 Potential to Achieve Goals: Fair    Frequency BID   Barriers to discharge        Co-evaluation               End of Session Equipment Utilized During Treatment: Gait belt Activity Tolerance: Patient limited by pain Patient left: in bed;Other (comment);with SCD's reapplied;with call bell/phone within reach (Bed alarm not functioning, nursing aware) Nurse Communication: Mobility status PT Visit Diagnosis: Muscle weakness (generalized) (M62.81);Other abnormalities of gait and mobility (R26.89)    Time: 1030-1110 PT Time Calculation (min) (ACUTE ONLY): 40 min   Charges:   PT Evaluation $PT Eval Low Complexity:  1 Procedure PT Treatments $Therapeutic Exercise: 8-22 mins   PT G Codes:        DRoyetta Asal PT, DPT 05/11/16, 2:42 PM

## 2016-05-11 NOTE — Progress Notes (Signed)
Pre hd assessment  

## 2016-05-11 NOTE — Progress Notes (Signed)
OT Cancellation Note  Patient Details [name] MRN: [id number] DOB: Jun 24, 1926   Cancelled Treatment:    Reason Eval/Treat Not Completed: Patient at procedure or test/ unavailable. Order received, chart reviewed. Spoke to PT to confirm bed rest status after PT spoke with RN. Pt out of room for HD. Will re-attempt OT evaluation at later date/time as pt is available.  Jeni Salles, MPH, MS, OTR/L ascom [phone] 05/11/16, 12:29 PM

## 2016-05-11 NOTE — Progress Notes (Addendum)
Initial Nutrition Assessment  DOCUMENTATION CODES:   Severe malnutrition in context of chronic illness  INTERVENTION:  1. Ensure Enlive po TID, each supplement provides 350 kcal and 20 grams of protein 2. Encouraged PO intake, consumption of protein, briefly educated. 3. Patient was on Vitamin D 50,000 units PTA - did not see a current lab - but appears calc, phos, PTH at goal per nephrology. 4. Renal MVI  NUTRITION DIAGNOSIS:   Malnutrition related to chronic illness (ESRD, Multiple Hip Fxs over past 5 months) as evidenced by severe depletion of muscle mass, severe depletion of body fat.  GOAL:   Patient will meet greater than or equal to 90% of their needs  MONITOR:   PO intake, I & O's, Labs, Weight trends, Supplement acceptance  REASON FOR ASSESSMENT:   Other (Comment) (Low BMI)    ASSESSMENT:    Sharon [name]  is a 83 y.o. female with a known history of Coronary artery disease, arthritis, skin cancer, cardiomyopathy, irritable bowel syndrome, end-stage renal disease on dialysis, sleep apnea, pacemaker presented to the emergency room with fall  Spoke with patient in Dialysis. She reports fracturing both hips over the past 5 months. Endorses 40-50# wt loss over the past 2 years - last 6 weights in chart appear to be stated at 45.4kg Patient claims he UBW is around 98# PO intake during stay has been ok - had eggs, bacon this morning per patient. She was unable to recall usual intake for me. Came from [facility] so was at least receiving 3 meals per day. Normally drinks Ensure or Equate - provided at bedside. No N/V/D/C or concerns with chewing/swallowing/choking  Nutrition-Focused physical exam completed. Findings are severe fat depletion, severe muscle depletion, and no edema.   Labs and medications reviewed: Iron, KCL, Senokot  Diet Order:  Diet regular Room service appropriate? Yes; Fluid consistency: Thin  Skin:  Reviewed, no issues  Last BM:   05/09/2016  Height:   Ht Readings from Last 1 Encounters:  05/09/16 5\' 4"  (1.626 m)    Weight:   Wt Readings from Last 1 Encounters:  05/09/16 100 lb (45.4 kg)    Ideal Body Weight:  54.54 kg  BMI:  Body mass index is 17.16 kg/m.  Estimated Nutritional Needs:   Kcal:  7654-6503 calories  Protein:  45-54 gm  Fluid:  >/= 1.1L  EDUCATION NEEDS:   No education needs identified at this time  Satira Anis. Aswad Wandrey, MS, RD LDN Inpatient Clinical Dietitian Pager [phone]

## 2016-05-11 NOTE — Progress Notes (Signed)
Pre hd info 

## 2016-05-11 NOTE — Progress Notes (Signed)
Subjective: 1 Day Post-Op Procedure(s) (LRB): CANNULATED HIP PINNING (Left)    Patient reports pain as mild.  Objective:   VITALS:   Vitals:   05/11/16 1450 05/11/16 1548  BP: (!) 144/84 (!) 144/50  Pulse: 60 70  Resp: 16 18  Temp:  98.5 F (36.9 C)    Neurologically intact ABD soft Neurovascular intact Sensation intact distally Intact pulses distally Dorsiflexion/Plantar flexion intact Incision: scant drainage  LABS  Recent Labs  05/10/16 0141 05/10/16 0548 05/11/16 0356  HGB 12.5 12.4 11.6*  HCT 37.4 36.9 35.2  WBC 7.3 8.0 7.2  PLT 245 234 198     Recent Labs  05/10/16 0141 05/10/16 0548  NA 136 135  K 3.6 3.5  BUN 46* 44*  CREATININE 2.52* 2.53*  GLUCOSE 111* 112*     Recent Labs  05/10/16 0141  INR 1.08     Assessment/Plan: 1 Day Post-Op Procedure(s) (LRB): CANNULATED HIP PINNING (Left)   Advance diet Up with therapy D/C IV fluids Discharge to SNF

## 2016-05-11 NOTE — Clinical Social Work Note (Signed)
Clinical Social Work Assessment  Patient Details  [name] MRN: [id number] Date of Birth: 12-28-1926  Date of referral:  05/11/16               Reason for consult:  Facility Placement                Permission sought to share information with:  Chartered certified accountant granted to share information::  Yes, Verbal Permission Granted  Name::      Fish farm manager::   [place]   Relationship::     Contact Information:     Housing/Transportation Living arrangements for the past 2 months:  [place], [place] of Information:  Patient Patient Interpreter Needed:  None Criminal Activity/Legal Involvement Pertinent to Current Situation/Hospitalization:  No - Comment as needed Significant Relationships:  Siblings Lives with:  Facility Resident Do you feel safe going back to the place where you live?  Yes Need for family participation in patient care:  Yes (Comment)  Care giving concerns:  Patient is a long term care resident at [facility].    Social Worker assessment / plan:  [place] representative (CSW) received consult that patient is from [facility]. PT is recommending SNF. CSW contacted [place] admissions coordinator at [facility] who reported that patient is on the ALF side. CSW explained that patient will have to come to the SNF side. Per Tiffany patient can come to SNF side for rehab. FL2 complete and faxed out, PASARR is pending. CSW met with patient to discuss D/C plan. Patient was alert and oriented X4 and is agreeable to go to [facility] SNF side. CSW will continue to follow and assist as needed.   Employment status:  Retired Forensic scientist:  Medicare PT Recommendations:  [place] / Referral to community resources:  [place]  Patient/Family's Response to care:  Patient is agreeable to D/C to [facility].    Patient/Family's Understanding of and Emotional Response to Diagnosis, Current Treatment, and Prognosis:  Patient was very pleasant and thanked CSW for visit.   Emotional Assessment Appearance:  Appears stated age Attitude/Demeanor/Rapport:    Affect (typically observed):  Accepting, Adaptable, Pleasant Orientation:  Oriented to Self, Oriented to Place, Oriented to  Time, Oriented to Situation Alcohol / Substance use:  Not Applicable Psych involvement (Current and /or in the community):  No (Comment)  Discharge Needs  Concerns to be addressed:  Discharge Planning Concerns Readmission within the last 30 days:  No Current discharge risk:  Dependent with Mobility Barriers to Discharge:  Continued Medical Work up   [employer], Veronia Beets, LCSW 05/11/2016, 5:23 PM

## 2016-05-11 NOTE — NC FL2 (Signed)
[place] LEVEL OF CARE SCREENING TOOL     IDENTIFICATION  Patient [name] Birthdate: Jul 05, 1926 Sex: female Admission Date (Current Location): 05/09/2016  [facility] and [place] Number:  Engineering geologist and Address:  [facility], [address] [place], [place] [address]      Provider Number: [id number]  Attending Physician Name and Address:  Max Sane, MD  Relative Name and Phone Number:       Current Level of Care: Hospital Recommended Level of Care: [place] Prior Approval Number:    Date Approved/Denied:   PASRR Number:    Discharge Plan: SNF    Current Diagnoses: Patient Active Problem List   Diagnosis Date Noted  . Hip fracture ([place]) 05/10/2016  . Falls 04/27/2016  . Left hand pain 04/27/2016  . Head injury 04/27/2016  . ESRD on dialysis ([place]) 04/10/2016  . Pressure injury of skin 12/15/2015  . Closed left subtrochanteric femur fracture ([name]) 12/15/2015  . Femur fracture, left ([place]) 12/14/2015  . Meniere disease   . Loss of weight 10/21/2015  . Skin tear of elbow without complication 81/19/1478  . Clinical depression 06/20/2015  . Combined fat and carbohydrate induced hyperlipemia 06/20/2015  . Dysphagia 05/24/2015  . Dyspnea 05/24/2015  . Imbalance 04/22/2015  . Electrical shock sensation in right posterior head 11/22/2014  . Fall at home 11/15/2014  . Expressive aphasia 10/04/2014  . Breathlessness on exertion 09/13/2014  . Chronic pain syndrome 05/22/2014  . Insomnia 03/13/2014  . Anxiety state 03/13/2014  . Arteriosclerosis of coronary artery 01/15/2014  . Chronic systolic heart failure ([place]) 01/15/2014  . Obstructive apnea 01/15/2014  . Basal cell carcinoma of neck 11/14/2013  . Degeneration of intervertebral disc of cervical region 11/07/2013  . Cervical radiculitis 10/16/2013  . Pelvic pain in female 09/12/2013  . Neck pain of over 3 months duration 09/12/2013  .  Memory loss 09/12/2013  . Systolic heart failure, chronic ([place]) 05/12/2013  . Medicare annual wellness visit, subsequent 11/10/2012  . Sinus node dysfunction ([place]) 08/01/2012  . Low back pain 08/01/2012  . Cervical spine pain 05/02/2012  . Irritable bowel syndrome 11/02/2011  . Depression 04/01/2011  . Hypertension 04/01/2011  . Menopausal disorder 04/01/2011  . Screening for breast cancer 04/01/2011  . Chronic kidney disease, stage 5, kidney failure ([place]) 04/01/2011    Orientation RESPIRATION BLADDER Height & Weight     Self, Time, Situation, Place  Normal Continent Weight:  (bed scale broken) Height:  5\' 4"  (162.6 cm)  BEHAVIORAL SYMPTOMS/MOOD NEUROLOGICAL BOWEL NUTRITION STATUS   (none)  (none) Continent Diet (Regular Diet )  AMBULATORY STATUS COMMUNICATION OF NEEDS Skin   Extensive Assist Verbally Surgical wounds (Incision: Left Hip. )                       Personal Care Assistance Level of Assistance  Bathing, Feeding, Dressing Bathing Assistance: Limited assistance Feeding assistance: Independent Dressing Assistance: Limited assistance     Functional Limitations Info  Sight, Hearing, Speech Sight Info: Adequate Hearing Info: Impaired Speech Info: Adequate    SPECIAL CARE FACTORS FREQUENCY  PT (By licensed PT), OT (By licensed OT)     PT Frequency:  (5) OT Frequency:  (5)            Contractures      Additional Factors Info  Code Status, Allergies Code Status Info:  (Full Code. ) Allergies Info:  (Statins, Effexor Venlafaxine, Codeine)  Current Medications (05/11/2016):  This is the current hospital active medication list Current Facility-Administered Medications  Medication Dose Route Frequency Provider Last Rate Last Dose  . 0.45 % sodium chloride infusion   Intravenous Continuous Earnestine Leys, MD   Stopped at 05/11/16 1129  . acetaminophen (TYLENOL) tablet 650 mg  650 mg Oral Q6H PRN Earnestine Leys, MD       Or  . acetaminophen  (TYLENOL) suppository 650 mg  650 mg Rectal Q6H PRN Earnestine Leys, MD      . acetaminophen (TYLENOL) tablet 1,000 mg  1,000 mg Oral Q6H Earnestine Leys, MD   1,000 mg at 05/11/16 0855  . alum & mag hydroxide-simeth (MAALOX/MYLANTA) 200-200-20 MG/5ML suspension 30 mL  30 mL Oral Q4H PRN Earnestine Leys, MD      . aspirin EC tablet 325 mg  325 mg Oral Q breakfast Earnestine Leys, MD      . bisacodyl (DULCOLAX) suppository 10 mg  10 mg Rectal Daily PRN Earnestine Leys, MD      . diphenoxylate-atropine (LOMOTIL) 2.5-0.025 MG per tablet 1 tablet  1 tablet Oral QID PRN Earnestine Leys, MD      . ferrous sulfate tablet 325 mg  325 mg Oral Q breakfast Earnestine Leys, MD   325 mg at 05/11/16 0855  . HYDROcodone-acetaminophen (NORCO/VICODIN) 5-325 MG per tablet 1-2 tablet  1-2 tablet Oral Q6H PRN Earnestine Leys, MD   1 tablet at 05/11/16 0544  . [START ON 05/12/2016] levothyroxine (SYNTHROID, LEVOTHROID) tablet 25 mcg  25 mcg Oral Q0600 Max Sane, MD      . magnesium hydroxide (MILK OF MAGNESIA) suspension 30 mL  30 mL Oral Daily PRN Earnestine Leys, MD      . menthol-cetylpyridinium (CEPACOL) lozenge 3 mg  1 lozenge Oral PRN Earnestine Leys, MD       Or  . phenol (CHLORASEPTIC) mouth spray 1 spray  1 spray Mouth/Throat PRN Earnestine Leys, MD      . methocarbamol (ROBAXIN) tablet 500 mg  500 mg Oral BID PRN Earnestine Leys, MD   500 mg at 05/10/16 2251  . metoCLOPramide (REGLAN) tablet 5-10 mg  5-10 mg Oral Q8H PRN Earnestine Leys, MD       Or  . metoCLOPramide (REGLAN) injection 5-10 mg  5-10 mg Intravenous Q8H PRN Earnestine Leys, MD      . morphine 2 MG/ML injection 0.5 mg  0.5 mg Intravenous Q2H PRN Earnestine Leys, MD   0.5 mg at 05/11/16 0542  . multivitamin (RENA-VIT) tablet 1 tablet  1 tablet Oral QHS Max Sane, MD      . ondansetron [employer]) tablet 4 mg  4 mg Oral Q6H PRN Earnestine Leys, MD       Or  . ondansetron [facility]) injection 4 mg  4 mg Intravenous Q6H PRN Earnestine Leys, MD      . potassium chloride  (K-DUR,KLOR-CON) CR tablet 10 mEq  10 mEq Oral Once Earnestine Leys, MD      . [facility] [employer] [facility]) tablet 8.6 mg  1 tablet Oral BID Earnestine Leys, MD   8.6 mg at 05/10/16 2034  . sertraline (ZOLOFT) tablet 150 mg  150 mg Oral Daily Vipul Shah, MD      . sodium phosphate (FLEET) 7-19 GM/118ML enema 1 enema  1 enema Rectal Once PRN Earnestine Leys, MD      . zolpidem (AMBIEN) tablet 5 mg  5 mg Oral QHS PRN Earnestine Leys, MD         Discharge Medications:  Please see discharge summary for a list of discharge medications.  Relevant Imaging Results:  Relevant Lab Results:   Additional Information  (SSN: [id number])  Ariane Ditullio, Veronia Beets, LCSW

## 2016-05-12 LAB — MAGNESIUM: MAGNESIUM: 1.8 mg/dL (ref 1.7–2.4)

## 2016-05-12 LAB — RENAL FUNCTION PANEL
Albumin: 3.2 g/dL — ABNORMAL LOW (ref 3.5–5.0)
Anion gap: 8 (ref 5–15)
BUN: 31 mg/dL — ABNORMAL HIGH (ref 6–20)
CALCIUM: 8.8 mg/dL — AB (ref 8.9–10.3)
CO2: 27 mmol/L (ref 22–32)
CREATININE: 2.14 mg/dL — AB (ref 0.44–1.00)
Chloride: 101 mmol/L (ref 101–111)
GFR, EST AFRICAN AMERICAN: 23 mL/min — AB (ref >60)
GFR, EST NON AFRICAN AMERICAN: 20 mL/min — AB (ref >60)
Glucose, Bld: 107 mg/dL — ABNORMAL HIGH (ref 65–99)
PHOSPHORUS: 3.3 mg/dL (ref 2.5–4.6)
Potassium: 3.4 mmol/L — ABNORMAL LOW (ref 3.5–5.1)
SODIUM: 136 mmol/L (ref 135–145)

## 2016-05-12 LAB — CBC
HCT: 35 % (ref 35.0–47.0)
Hemoglobin: 11.5 g/dL — ABNORMAL LOW (ref 12.0–16.0)
MCH: 28.2 pg (ref 26.0–34.0)
MCHC: 32.8 g/dL (ref 32.0–36.0)
MCV: 85.8 fL (ref 80.0–100.0)
PLATELETS: 195 10*3/uL (ref 150–440)
RBC: 4.08 MIL/uL (ref 3.80–5.20)
RDW: 16.3 % — ABNORMAL HIGH (ref 11.5–14.5)
WBC: 7 10*3/uL (ref 3.6–11.0)

## 2016-05-12 MED ORDER — POTASSIUM CHLORIDE CRYS ER 20 MEQ PO TBCR
20.0000 meq | EXTENDED_RELEASE_TABLET | Freq: Once | ORAL | Status: AC
  Administered 2016-05-12: 20 meq via ORAL
  Filled 2016-05-12: qty 1

## 2016-05-12 NOTE — Progress Notes (Signed)
Family Meeting Note  Advance Directive:yes  Today a meeting took place with the Patient.  The following clinical team members were present during this meeting:MD  The following were discussed:Patient's diagnosis: , Patient's progosis: > 12 months and Goals for treatment: DNR  Additional follow-up to be provided: Palliative care c/s  Time spent during discussion:20 minutes  Max Sane, MD

## 2016-05-12 NOTE — Progress Notes (Signed)
Patient ID: Sharon [name], female   DOB: 1926/12/30, 83 y.o.   MRN: [id number]  [employer] PROGRESS NOTE  FANNYE MYER WIO:[id number] DOB: 03/23/1926 DOA: 05/09/2016 PCP: [name], MD  HPI/Subjective: Overall, doing well, postop day 2, not using narcotics, has some muscle cramps, refusing Robaxin  Objective: Vitals:   05/12/16 0013 05/12/16 0538  BP: (!) 135/50 (!) 146/50  Pulse: 65 66  Resp: 18 18  Temp: 98.2 F (36.8 C) 97.8 F (36.6 C)    Filed Weights   05/09/16 2341  Weight: 45.4 kg (100 lb)    ROS: Review of Systems  Constitutional: Negative for chills and fever.  Eyes: Negative for blurred vision.  Respiratory: Negative for cough and shortness of breath.   Cardiovascular: Negative for chest pain.  Gastrointestinal: Negative for abdominal pain, constipation, diarrhea, nausea and vomiting.  Genitourinary: Negative for dysuria.  Musculoskeletal: Positive for joint pain.  Neurological: Negative for dizziness and headaches.   Exam: Physical Exam  Constitutional: She is oriented to person, place, and time.  HENT:  Nose: No mucosal edema.  Mouth/Throat: No oropharyngeal exudate or posterior oropharyngeal edema.  Eyes: Conjunctivae, EOM and lids are normal. Pupils are equal, round, and reactive to light.  Neck: No JVD present. Carotid bruit is not present. No edema present. No thyroid mass and no thyromegaly present.  Cardiovascular: S1 normal and S2 normal.  Exam reveals no gallop.   No murmur heard. Pulses:      Dorsalis pedis pulses are 2+ on the right side, and 2+ on the left side.  Respiratory: No respiratory distress. She has no wheezes. She has no rhonchi. She has no rales.  GI: Soft. Bowel sounds are normal. There is no tenderness.  Musculoskeletal:       Right ankle: She exhibits no swelling.       Left ankle: She exhibits no swelling.  Lymphadenopathy:    She has no cervical adenopathy.  Neurological: She is alert and oriented to person,  place, and time. No cranial nerve deficit.  Skin: Skin is warm. No rash noted. Nails show no clubbing.  Psychiatric: She has a normal mood and affect.      Data Reviewed: Basic Metabolic Panel:  Recent Labs Lab 05/10/16 0141 05/10/16 0548 05/12/16 0451  NA 136 135 136  K 3.6 3.5 3.4*  CL 100* 101 101  CO2 26 24 27   GLUCOSE 111* 112* 107*  BUN 46* 44* 31*  CREATININE 2.52* 2.53* 2.14*  CALCIUM 9.6 9.2 8.8*  PHOS  --   --  3.3   Liver Function Tests:  Recent Labs Lab 05/10/16 0141 05/12/16 0451  AST 39  --   ALT 24  --   ALKPHOS 150*  --   BILITOT 0.8  --   PROT 7.2  --   ALBUMIN 3.9 3.2*   CBC:  Recent Labs Lab 05/10/16 0141 05/10/16 0548 05/11/16 0356 05/12/16 0451  WBC 7.3 8.0 7.2 7.0  NEUTROABS 5.7  --   --   --   HGB 12.5 12.4 11.6* 11.5*  HCT 37.4 36.9 35.2 35.0  MCV 84.9 86.1 86.8 85.8  PLT 245 234 198 195     Recent Results (from the past 240 hour(s))  Surgical PCR screen     Status: None   Collection Time: 05/10/16  5:39 AM  Result Value Ref Range Status   MRSA, PCR NEGATIVE NEGATIVE Final   Staphylococcus aureus NEGATIVE NEGATIVE Final    Comment:  The Xpert SA Assay (FDA approved for NASAL specimens in patients over 71 years of age), is one component of a comprehensive surveillance program.  Test performance has been validated by [facility] for patients greater than or equal to 64 year old. It is not intended to diagnose infection nor to guide or monitor treatment.      Studies: Dg Hip Operative Unilat W Or W/o Pelvis Left  Result Date: 05/10/2016 CLINICAL DATA:  Known left femoral fracture EXAM: OPERATIVE LEFT HIP WITH PELVIS COMPARISON:  None. FLUOROSCOPY TIME:  Radiation Exposure Index (as provided by the fluoroscopic device): 2.26 mGy If the device does not provide the exposure index: Fluoroscopy Time:  32 seconds Number of Acquired Images:  2 FINDINGS: Multiple fixation screws are now seen traversing the left femoral  neck. The fracture fragments are in near anatomic alignment. Previously seen medullary rod in the proximal femur is again noted. IMPRESSION: Status post ORIF of proximal left femoral fracture. Electronically Signed   By: Inez Catalina M.D.   On: 05/10/2016 19:00    Scheduled Meds: . aspirin EC  325 mg Oral Q breakfast  . ferrous sulfate  325 mg Oral Q breakfast  . levothyroxine  25 mcg Oral Q0600  . multivitamin  1 tablet Oral QHS  . potassium chloride  10 mEq Oral Once  . senna  1 tablet Oral BID  . sertraline  150 mg Oral Daily   Continuous Infusions: . sodium chloride Stopped (05/11/16 1129)    Assessment/Plan:  1. left hip fracture pain: Status post hip pinning, postop day 2, further management per orthopedics.  Stop narcotics 2. Hypokalemia: Replete and recheck 3. End-stage renal disease on dialysis: dialysis days are Monday Wednesday and Friday. Patient recently had a fistula placed in the right arm and is requesting reassessment - Consult Vascular surgery. Patient also has access in her right chest. 4. History of cardiomyopathy and essential hypertension on metoprolol low dose. Dialysis to remove fluid 5. Depression on Zoloft 6. Hypothyroidism: Continue levothyroxine 7. History of pacemaker 8. History of Mnire's disease  Code Status: DO NOT RESUSCITATE  Family Communication: none  Disposition Plan: Rehabilitation 1 days postoperatively depending on clinical course  Consultants:  Orthopedic surgery  Time spent: 25 minutes  Everhett Bozard [employer]

## 2016-05-12 NOTE — Progress Notes (Signed)
Subjective: 2 Days Post-Op Procedure(s) (LRB): CANNULATED HIP PINNING (Left)    Patient reports pain as mild. Sleeping but arousable.  csm good and dressing dry.    Objective:   VITALS:   Vitals:   05/12/16 0842 05/12/16 1035  BP: (!) 146/53   Pulse: 61 61  Resp: 18   Temp: 97.6 F (36.4 C)     Neurologically intact ABD soft Neurovascular intact Sensation intact distally Intact pulses distally Dorsiflexion/Plantar flexion intact Incision: scant drainage  LABS  Recent Labs  05/10/16 0548 05/11/16 0356 05/12/16 0451  HGB 12.4 11.6* 11.5*  HCT 36.9 35.2 35.0  WBC 8.0 7.2 7.0  PLT 234 198 195     Recent Labs  05/10/16 0141 05/10/16 0548 05/12/16 0451  NA 136 135 136  K 3.6 3.5 3.4*  BUN 46* 44* 31*  CREATININE 2.52* 2.53* 2.14*  GLUCOSE 111* 112* 107*     Recent Labs  05/10/16 0141  INR 1.08     Assessment/Plan: 2 Days Post-Op Procedure(s) (LRB): CANNULATED HIP PINNING (Left)   Advance diet Up with therapy Discharge to SNF

## 2016-05-12 NOTE — Progress Notes (Signed)
Pt alert and oriented. Refuses to take pain medication other than tylenol. She complains of spasms but refuses Robaxin.

## 2016-05-12 NOTE — Plan of Care (Signed)
Problem: Pain Managment: Goal: General experience of comfort will improve Outcome: Progressing Pt is experiencing pain, but refuses to take narcs for pain. Her comfort will improve by being repositioned in bed and administering tylenol

## 2016-05-12 NOTE — Progress Notes (Signed)
Physical Therapy Treatment Patient Details [name] MRN: [id number] DOB: 11-24-1926 Today's Date: 05/12/2016    History of Present Illness Pt is an 83 y.o. female who complains of  Left hip pain following a fall at [facility]. Exam and x-rays in the ER last night reveal a slightly displaced left subcapital hip fracture with a retrograde IM femoral rod in place from surgery 6 months ago.  Pt is s/p cannulated pinning to L hip.  Pt also noted to have L hand metacarpal Fx with brace in place and a fistula to RUE for HD secondary to ESRD.     PT Comments    Pt agreeable to PT; reports pain in bilateral hips/lower extremities is "okay" right now. Pt lethargic requiring re awakening several times. Pt requires assist with exercises bilaterally in long sit position. Caution provided with right hip abduction by increased assist and decreased range; pt without pain complaints with exercise. Pt wished to remain in chair post exercise and falls asleep. Continue PT to progress strength, endurance, balance and safety to improve all functional mobility.    Follow Up Recommendations  SNF     Equipment Recommendations  None recommended by PT    Recommendations for Other Services       Precautions / Restrictions Precautions Precautions: Fall Restrictions Weight Bearing Restrictions: Yes RLE Weight Bearing: Weight bearing as tolerated LLE Weight Bearing: Touchdown weight bearing Other Position/Activity Restrictions: Metacarpal Fx to L hand in brace, unknown WB status, use PW    Mobility  Bed Mobility Overal bed mobility: Needs Assistance Bed Mobility: Supine to Sit     Supine to sit: Mod assist;+2 for physical assistance     General bed mobility comments: Not tested; pt up in chair and wishes to remain in chair  Transfers Overall transfer level: Needs assistance Equipment used: Left platform walker Transfers: Sit to/from Stand Sit to Stand: Mod assist;+2  safety/equipment         General transfer comment: Heavy cueing for sequence, safe hand placement, wb'g status BLE and staying focused on task at hand  Ambulation/Gait Ambulation/Gait assistance: Mod assist Ambulation Distance (Feet): 2 Feet Assistive device: Left platform walker Gait Pattern/deviations: Step-to pattern Gait velocity: slow Gait velocity interpretation: <1.8 ft/sec, indicative of risk for recurrent falls General Gait Details: heavy cueing for sequence, wb'g status and focus on task at hand. Pt unable to lift RLE; pt does pivot RLE    Stairs            Wheelchair Mobility    Modified Rankin (Stroke Patients Only)       Balance Overall balance assessment: Needs assistance Sitting-balance support: Feet supported;Bilateral upper extremity supported Sitting balance-Leahy Scale: Fair Sitting balance - Comments: cues to maintain RLE on floor   Standing balance support: Bilateral upper extremity supported Standing balance-Leahy Scale: Poor                              Cognition Arousal/Alertness: Lethargic Behavior During Therapy: WFL for tasks assessed/performed Overall Cognitive Status: Within Functional Limits for tasks assessed                                        Exercises General Exercises - Lower Extremity Ankle Circles/Pumps: AROM;Both;20 reps Quad Sets: Strengthening;Both;10 reps Gluteal Sets: Strengthening;Both;10 reps Short Arc QuadSinclair Ship;Both;10 reps Heel Slides: AAROM;Both;10  reps Hip ABduction/ADduction: AAROM;Both;10 reps;Other (comment) (limit range on R)    General Comments        Pertinent Vitals/Pain Pain Assessment: 0-10 Pain Score: 8  Pain Location: Reports pain in B hips/legs is "okay" right now    Home Living                      Prior Function            PT Goals (current goals can now be found in the care plan section) Progress towards PT goals: Progressing toward  goals    Frequency    BID      PT Plan Current plan remains appropriate    Co-evaluation             End of Session Equipment Utilized During Treatment: Gait belt Activity Tolerance: Patient limited by fatigue;Patient limited by lethargy (weakness)     PT Visit Diagnosis: Muscle weakness (generalized) (M62.81);Other abnormalities of gait and mobility (R26.89)     Time: 0981-1914 PT Time Calculation (min) (ACUTE ONLY): 20 min  Charges:  $Gait Training: 8-22 mins $Therapeutic Exercise: 8-22 mins $Therapeutic Activity: 8-22 mins                    G CodesLarae Grooms, PTA 05/12/2016, 3:28 PM

## 2016-05-12 NOTE — Progress Notes (Signed)
Physical Therapy Treatment Patient Details [name] MRN: [id number] DOB: 1926/10/17 Today's Date: 05/12/2016    History of Present Illness Pt is an 83 y.o. female who complains of  Left hip pain following a fall at [facility]. Exam and x-rays in the ER last night reveal a slightly displaced left subcapital hip fracture with a retrograde IM femoral rod in place from surgery 6 months ago.  Pt is s/p cannulated pinning to L hip.  Pt also noted to have L hand metacarpal Fx with brace in place and a fistula to RUE for HD secondary to ESRD.     PT Comments    Pt agreeable to PT. Pt reports pain in Bilateral lower extremities/hip with left > right. Pt demonstrates that she has been trying to working right hip/leg out the the side in supine (abduction). Educated pt that according to chart/MD orders pt is NOT to aggressively work right hip abduction. Advised pt to only perform that exercise with therapist for gentle active assisted range of motion; pt understands. Pt requires 2 assist for mobility for safety. Pt has difficulty focusing at task at hand as she is constantly talking; requires encouragement and instruction to following current instructions being given. Pt requires sequencing cues for all mobility. Pt does maintain lower extremity weight bearing status bilaterally with cues and assist for small distance (2 ft) bed to chair with fatigue. Encouraged up in chair until afternoon PT session. Continue progress this afternoon on strength, endurance and safety for improved functional mobility.    Follow Up Recommendations  SNF     Equipment Recommendations  None recommended by PT    Recommendations for Other Services       Precautions / Restrictions Precautions Precautions: Fall Restrictions Weight Bearing Restrictions: Yes RLE Weight Bearing: Weight bearing as tolerated LLE Weight Bearing: Touchdown weight bearing Other Position/Activity Restrictions: Metacarpal Fx to L  hand in brace, unknown WB status, use PW    Mobility  Bed Mobility Overal bed mobility: Needs Assistance Bed Mobility: Supine to Sit     Supine to sit: Mod assist;+2 for physical assistance     General bed mobility comments: heavy cueing for sequence and maintaining focus on task at hand   Transfers Overall transfer level: Needs assistance Equipment used: Left platform walker Transfers: Sit to/from Stand Sit to Stand: Mod assist;+2 safety/equipment         General transfer comment: Heavy cueing for sequence, safe hand placement, wb'g status BLE and staying focused on task at hand  Ambulation/Gait Ambulation/Gait assistance: Mod assist Ambulation Distance (Feet): 2 Feet Assistive device: Left platform walker Gait Pattern/deviations: Step-to pattern Gait velocity: slow Gait velocity interpretation: <1.8 ft/sec, indicative of risk for recurrent falls General Gait Details: heavy cueing for sequence, wb'g status and focus on task at hand. Pt unable to lift RLE; pt does pivot RLE    Stairs            Wheelchair Mobility    Modified Rankin (Stroke Patients Only)       Balance Overall balance assessment: Needs assistance Sitting-balance support: Feet supported;Bilateral upper extremity supported Sitting balance-Leahy Scale: Fair Sitting balance - Comments: cues to maintain RLE on floor   Standing balance support: Bilateral upper extremity supported Standing balance-Leahy Scale: Poor                              Cognition Arousal/Alertness: Awake/alert Behavior During Therapy: WFL for tasks  assessed/performed;Impulsive Overall Cognitive Status: Within Functional Limits for tasks assessed                                 General Comments: required verbal cues to focus, pt very talkative and acknowledged this      Exercises General Exercises - Lower Extremity Ankle Circles/Pumps: AROM;Both;20 reps;Supine Quad Sets:  Strengthening;Both;10 reps;Supine Gluteal Sets: Strengthening;Both;10 reps;Supine    General Comments        Pertinent Vitals/Pain Pain Assessment: 0-10 Pain Score: 8  Pain Location: L hip, also notes R hip pain especially with abd. Educated pt that she should not be exercising R hip abd on her own per MD at this time Pain Descriptors / Indicators: Aching Pain Intervention(s): Limited activity within patient's tolerance;Monitored during session;Premedicated before session;Repositioned    Home Living Family/patient expects to be discharged to:: [place]: Gilford Rile - 4 wheels;Walker - 2 wheels      Prior Function Level of Independence: Needs assistance  Gait / Transfers Assistance Needed: Staff SBA during amb with rollator or RW   Comments: Pt reports around 3 falls in last year with multiple BLE injuries   PT Goals (current goals can now be found in the care plan section) Acute Rehab PT Goals Patient Stated Goal: to get better Progress towards PT goals: Progressing toward goals    Frequency    BID      PT Plan Current plan remains appropriate    Co-evaluation             End of Session Equipment Utilized During Treatment: Gait belt Activity Tolerance: Patient limited by fatigue;Patient limited by pain (weakness)     PT Visit Diagnosis: Muscle weakness (generalized) (M62.81);Other abnormalities of gait and mobility (R26.89)     Time: 6384-5364 PT Time Calculation (min) (ACUTE ONLY): 40 min  Charges:  $Gait Training: 8-22 mins $Therapeutic Exercise: 8-22 mins $Therapeutic Activity: 8-22 mins                    G Codes:        Larae Grooms, PTA 05/12/2016, 11:51 AM

## 2016-05-12 NOTE — Progress Notes (Addendum)
Plan is for patient to D/C to [facility] when medically stable. PASARR has been received, [id number] A. Clinical Social Worker (CSW) will continue to follow and assist as needed.   Patient's brother Santina Evans is aware of plan for D/C to [facility] and is agreeable to plan.   [employer], LCSW [phone]

## 2016-05-12 NOTE — Evaluation (Signed)
Occupational Therapy Evaluation Patient Details [name] MRN: [id number] DOB: 09/25/1926 Today's Date: 05/12/2016    History of Present Illness Pt is an 83 y.o. female who complains of  Left hip pain following a fall at [facility]. Exam and x-rays in the ER last night reveal a slightly displaced left subcapital hip fracture with a retrograde IM femoral rod in place from surgery 6 months ago.  Pt is s/p cannulated pinning to L hip.  Pt also noted to have L hand metacarpal Fx with brace in place and a fistula to RUE for HD secondary to ESRD.    Clinical Impression   Pt seen for OT evaluation this date. Pt was at [facility] recently at ALF level of care and about to start therapy for RLE injury when she fell, leading to a cannulated pinning to L hip. Pt currently limited by pain in L hip, impaired LUE use due to metacarpal fracture, decreased strength/ROM/activity tolerance/safety awareness/knowledge of AE, increased falls risk with report of multiple falls in past year leading to significant injury, and increased caregiver burden. Pt would benefit from skilled OT services to address noted impairments and functional deficits, including AE training/education for ADL, falls prevention education, energy conservation strategies, addressing incontinence mgt, and education/training in cognitive behavioral pain coping strategies to maximize return to PLOF and minimize future falls risk, injury, and rehospitalization.     Follow Up Recommendations  SNF    Equipment Recommendations  Other (comment) (defer to next venue of care)    Recommendations for Other Services       Precautions / Restrictions Precautions Precautions: Fall Restrictions Weight Bearing Restrictions: Yes RLE Weight Bearing: Weight bearing as tolerated LLE Weight Bearing: Touchdown weight bearing Other Position/Activity Restrictions: Metacarpal Fx to L hand in brace, unknown WB status, use PW      Mobility  Bed Mobility Overal bed mobility: Needs Assistance Bed Mobility:  (scooting up in bed - max Ax2)           General bed mobility comments: Limited by weakness and LLE pain with movement  Transfers                 General transfer comment: deferred due to pt reported pain, breakfast just arrived    Balance                                           ADL either performed or assessed with clinical judgement   ADL Overall ADL's : Needs assistance/impaired Eating/Feeding: Sitting;Set up   Grooming: Sitting;Set up   Upper Body Bathing: Sitting;Minimal assistance;Moderate assistance Upper Body Bathing Details (indicate cue type and reason): L hand limited with assist while in brace Lower Body Bathing: Moderate assistance;Bed level   Upper Body Dressing : Set up;Minimal assistance;Sitting Upper Body Dressing Details (indicate cue type and reason): cueing for dressing LUE first Lower Body Dressing: Moderate assistance;Bed level Lower Body Dressing Details (indicate cue type and reason): pt able to don sock on RLE while at bed level, requiring mod assist for LLE with increased pain with movement   Toilet Transfer Details (indicate cue type and reason): deferred due to safety/pain           General ADL Comments: pt generally min-mod assist for UB ADL, generally mod assist for LB ADL     Vision Baseline Vision/History: Wears glasses Wears Glasses: At  all times Patient Visual Report: No change from baseline (pt reports "overdue for an eye exam", pt reports already requesting appt be made) Vision Assessment?: No apparent visual deficits     Perception     Praxis Praxis Praxis tested?: Within functional limits    Pertinent Vitals/Pain Pain Assessment: 0-10 Pain Score: 8  Pain Location: L hip Pain Descriptors / Indicators: Aching Pain Intervention(s): Limited activity within patient's tolerance;Monitored during session;Premedicated before  session;Repositioned     Hand Dominance Right   Extremity/Trunk Assessment Upper Extremity Assessment Upper Extremity Assessment: Generalized weakness;LUE deficits/detail (shoulder flexion, elbow ext/flexion bilaterally grossly 4-/5, R grip strength good -) LUE Deficits / Details: L metacarpal fx, in brace, assuming NWB'ing status  LUE: Unable to fully assess due to immobilization   Lower Extremity Assessment Lower Extremity Assessment: Defer to PT evaluation;Generalized weakness (WBAT RLE, TTWBing LLE)       Communication Communication Communication: HOH   Cognition Arousal/Alertness: Awake/alert Behavior During Therapy: WFL for tasks assessed/performed Overall Cognitive Status: Within Functional Limits for tasks assessed                                 General Comments: required verbal cues to focus, pt very talkative and acknowledged this   General Comments       Exercises     Shoulder Instructions      Home Living Family/patient expects to be discharged to:: Skilled nursing facility                             Home Equipment: Walker - 4 wheels;Walker - 2 wheels          Prior Functioning/Environment Level of Independence: Needs assistance  Gait / Transfers Assistance Needed: Staff SBA during amb with rollator or RW     Comments: Pt reports around 3 falls in last year with multiple BLE injuries        OT Problem List: Decreased strength;Pain;Decreased range of motion;Decreased safety awareness;Decreased activity tolerance;Impaired balance (sitting and/or standing);Impaired UE functional use;Decreased knowledge of precautions;Decreased knowledge of use of DME or AE      OT Treatment/Interventions: Self-care/ADL training;Therapeutic exercise;Therapeutic activities;Energy conservation;DME and/or AE instruction;Patient/family education    OT Goals(Current goals can be found in the care plan section) Acute Rehab OT Goals Patient Stated  Goal: to get better OT Goal Formulation: With patient Time For Goal Achievement: 05/26/16 Potential to Achieve Goals: Good  OT Frequency: Min 1X/week   Barriers to D/C:            Co-evaluation              End of Session    Activity Tolerance: Patient limited by pain Patient left: in bed;with call bell/phone within reach;with bed alarm set  OT Visit Diagnosis: Other abnormalities of gait and mobility (R26.89);Muscle weakness (generalized) (M62.81);History of falling (Z91.81);Repeated falls (R29.6);Pain Pain - Right/Left: Left Pain - part of body: Hip                Time: 0623-7628 OT Time Calculation (min): 25 min Charges:  OT General Charges $OT Visit: 1 Procedure OT Evaluation $OT Eval Moderate Complexity: 1 Procedure OT Treatments $Self Care/Home Management : 8-22 mins G-Codes:     Jeni Salles, MPH, MS, OTR/L ascom [phone] 05/12/16, 9:30 AM

## 2016-05-12 NOTE — Progress Notes (Signed)
[facility]  ROUNDING NOTE   Subjective:   [name] admitted to [facility] on 05/09/2016 for Pain [R52] Fall [W19.XXXA] Hemodialysis patient [facility]) [Z99.2] Closed displaced fracture of left femoral neck ([place]) [S72.002A]   Patient underwent Left hip Pinning by Dr Sabra Heck on 4/15 Sitting up in the chair No acute complaints.  No shortness of breath.     Objective:  Vital signs in last 24 hours:  Temp:  [97.6 F (36.4 C)-99 F (37.2 C)] 98.2 F (36.8 C) (04/17 1505) Pulse Rate:  [61-70] 63 (04/17 1505) Resp:  [16-18] 16 (04/17 1505) BP: (129-146)/(43-53) 134/52 (04/17 1505) SpO2:  [96 %-99 %] 97 % (04/17 1505)  Weight change:  Filed Weights   05/09/16 2341  Weight: 45.4 kg (100 lb)    Intake/Output: I/O last 3 completed shifts: In: 2227.5 [P.O.:760; I.V.:1367.5; IV Piggyback:100] Out: 1000 [Urine:200; Other:800]   Intake/Output this shift:  Total I/O In: 120 [P.O.:120] Out: 200 [Urine:200]  Physical Exam: General: NAD,   Head: Normocephalic, atraumatic. Moist oral mucosal membranes  Eyes: Anicteric,   Neck: Supple,   Lungs:  Clear to auscultation  Heart: Regular rate and rhythm  Abdomen:  Soft, nontender,   Extremities: no peripheral edema, left hip pain  Neurologic: Alert, oriented, following commands  Skin: No lesions  Access: RIJ permcath, maturing right forearm AVF- developing (3/21) Dr Lucky Cowboy    Basic Metabolic Panel:  Recent Labs Lab 05/10/16 0141 05/10/16 0548 05/12/16 0451  NA 136 135 136  K 3.6 3.5 3.4*  CL 100* 101 101  CO2 26 24 27   GLUCOSE 111* 112* 107*  BUN 46* 44* 31*  CREATININE 2.52* 2.53* 2.14*  CALCIUM 9.6 9.2 8.8*  MG  --   --  1.8  PHOS  --   --  3.3    Liver Function Tests:  Recent Labs Lab 05/10/16 0141 05/12/16 0451  AST 39  --   ALT 24  --   ALKPHOS 150*  --   BILITOT 0.8  --   PROT 7.2  --   ALBUMIN 3.9 3.2*   No results for input(s): LIPASE, AMYLASE in the last 168 hours. No results for  input(s): AMMONIA in the last 168 hours.  CBC:  Recent Labs Lab 05/10/16 0141 05/10/16 0548 05/11/16 0356 05/12/16 0451  WBC 7.3 8.0 7.2 7.0  NEUTROABS 5.7  --   --   --   HGB 12.5 12.4 11.6* 11.5*  HCT 37.4 36.9 35.2 35.0  MCV 84.9 86.1 86.8 85.8  PLT 245 234 198 195    Cardiac Enzymes: No results for input(s): CKTOTAL, CKMB, CKMBINDEX, TROPONINI in the last 168 hours.  BNP: Invalid input(s): POCBNP  CBG: No results for input(s): GLUCAP in the last 168 hours.  Microbiology: Results for orders placed or performed during the hospital encounter of 05/09/16  Surgical PCR screen     Status: None   Collection Time: 05/10/16  5:39 AM  Result Value Ref Range Status   MRSA, PCR NEGATIVE NEGATIVE Final   Staphylococcus aureus NEGATIVE NEGATIVE Final    Comment:        The Xpert SA Assay (FDA approved for NASAL specimens in patients over 71 years of age), is one component of a comprehensive surveillance program.  Test performance has been validated by [facility] for patients greater than or equal to 20 year old. It is not intended to diagnose infection nor to guide or monitor treatment.     Coagulation Studies:  Recent Labs  05/10/16 0141  LABPROT 14.0  INR 1.08    Urinalysis: No results for input(s): COLORURINE, LABSPEC, PHURINE, GLUCOSEU, HGBUR, BILIRUBINUR, KETONESUR, PROTEINUR, UROBILINOGEN, NITRITE, LEUKOCYTESUR in the last 72 hours.  Invalid input(s): APPERANCEUR    Imaging: No results found.   Medications:   . sodium chloride Stopped (05/11/16 1129)   . aspirin EC  325 mg Oral Q breakfast  . ferrous sulfate  325 mg Oral Q breakfast  . levothyroxine  25 mcg Oral Q0600  . multivitamin  1 tablet Oral QHS  . potassium chloride  10 mEq Oral Once  . senna  1 tablet Oral BID  . sertraline  150 mg Oral Daily   acetaminophen **OR** acetaminophen, alum & mag hydroxide-simeth, bisacodyl, diphenoxylate-atropine, magnesium hydroxide,  menthol-cetylpyridinium **OR** phenol, methocarbamol, metoCLOPramide **OR** metoCLOPramide (REGLAN) injection, ondansetron **OR** ondansetron (ZOFRAN) IV, sodium phosphate, zolpidem  Assessment/ Plan:  [name] is a 83 y.o. white female with end stage renal disease on hemodialysis, hypertension, anemia of chronic kidney disease, depression, secondary hyperparathyroidism, hyperlipidemia  MWF [facility] RIJ permcath  1. End stage renal disease on hemodialysis: MWF.  Next dialysis to be scheduled for tomorrow morning  2. Anemia of chronic kidney disease: hemoglobin 11.5 - outpatient epogen.  3. Secondary Hyperparathyroidism: outpatient PTH, calcium and phos at goal.  - calcitriol - not currently on binders  4. Closed left hip fracture:  Underwent pinning on 05/10/16    LOS: 2 [name] 4/17/20185:58 PM

## 2016-05-13 LAB — RENAL FUNCTION PANEL
Albumin: 2.9 g/dL — ABNORMAL LOW (ref 3.5–5.0)
Anion gap: 10 (ref 5–15)
BUN: 42 mg/dL — ABNORMAL HIGH (ref 6–20)
CHLORIDE: 103 mmol/L (ref 101–111)
CO2: 23 mmol/L (ref 22–32)
CREATININE: 2.51 mg/dL — AB (ref 0.44–1.00)
Calcium: 8.8 mg/dL — ABNORMAL LOW (ref 8.9–10.3)
GFR, EST AFRICAN AMERICAN: 19 mL/min — AB (ref >60)
GFR, EST NON AFRICAN AMERICAN: 16 mL/min — AB (ref >60)
Glucose, Bld: 156 mg/dL — ABNORMAL HIGH (ref 65–99)
Phosphorus: 2.7 mg/dL (ref 2.5–4.6)
Potassium: 3.4 mmol/L — ABNORMAL LOW (ref 3.5–5.1)
Sodium: 136 mmol/L (ref 135–145)

## 2016-05-13 LAB — CBC
HCT: 31.5 % — ABNORMAL LOW (ref 35.0–47.0)
HEMATOCRIT: 35.8 % (ref 35.0–47.0)
Hemoglobin: 12 g/dL (ref 12.0–16.0)
Hemoglobin: 10.4 g/dL — ABNORMAL LOW (ref 12.0–16.0)
MCH: 28.4 pg (ref 26.0–34.0)
MCH: 28.3 pg (ref 26.0–34.0)
MCHC: 33.4 g/dL (ref 32.0–36.0)
MCHC: 33.1 g/dL (ref 32.0–36.0)
MCV: 85.2 fL (ref 80.0–100.0)
MCV: 85.5 fL (ref 80.0–100.0)
PLATELETS: 227 10*3/uL (ref 150–440)
PLATELETS: 225 10*3/uL (ref 150–440)
RBC: 4.2 MIL/uL (ref 3.80–5.20)
RBC: 3.68 MIL/uL — AB (ref 3.80–5.20)
RDW: 16.5 % — ABNORMAL HIGH (ref 11.5–14.5)
RDW: 16 % — ABNORMAL HIGH (ref 11.5–14.5)
WBC: 7.3 10*3/uL (ref 3.6–11.0)
WBC: 7.4 10*3/uL (ref 3.6–11.0)

## 2016-05-13 MED ORDER — IBUPROFEN 400 MG PO TABS: 400.0000 mg | ORAL_TABLET | Freq: Four times a day (QID) | ORAL | Status: DC

## 2016-05-13 MED ORDER — IBUPROFEN 400 MG PO TABS
400.0000 mg | ORAL_TABLET | Freq: Four times a day (QID) | ORAL | Status: DC | PRN
  Administered 2016-05-14: 400 mg via ORAL
  Filled 2016-05-13: qty 1

## 2016-05-13 NOTE — Progress Notes (Signed)
Hd end 

## 2016-05-13 NOTE — Progress Notes (Signed)
Post hd vitals 

## 2016-05-13 NOTE — Progress Notes (Signed)
[facility]  ROUNDING NOTE   Subjective:   [name] admitted to [facility] on 05/09/2016 for Pain [R52] Fall [W19.XXXA] Hemodialysis patient [facility]) [Z99.2] Closed displaced fracture of left femoral neck ([place]) [S72.002A]   Patient underwent Left hip Pinning by Dr Sabra Heck on 4/15    HEMODIALYSIS FLOWSHEET:  Blood Flow Rate (mL/min): 400 mL/min Arterial Pressure (mmHg): -170 mmHg Venous Pressure (mmHg): 130 mmHg Transmembrane Pressure (mmHg): 40 mmHg Ultrafiltration Rate (mL/min): 500 mL/min Dialysate Flow Rate (mL/min): 600 ml/min Conductivity: Machine : 14 Conductivity: Machine : 14 Dialysis Fluid Bolus: Normal Saline Bolus Amount (mL): 250 mL Dialysate Change:  (3k) Intra-Hemodialysis Comments: 1500. ended     Objective:  Vital signs in last 24 hours:  Temp:  [97.7 F (36.5 C)-98.8 F (37.1 C)] 97.7 F (36.5 C) (04/18 1309) Pulse Rate:  [60-66] 61 (04/18 1309) Resp:  [10-29] 18 (04/18 1309) BP: (125-159)/(40-59) 125/40 (04/18 1309) SpO2:  [96 %-100 %] 100 % (04/18 1309)  Weight change:  Filed Weights   05/09/16 2341  Weight: 45.4 kg (100 lb)    Intake/Output: I/O last 3 completed shifts: In: 90 [P.O.:780] Out: 200 [Urine:200]   Intake/Output this shift:  Total I/O In: 240 [P.O.:240] Out: 1000 [Other:1000]  Physical Exam: General: NAD,   Head: Normocephalic, atraumatic. Moist oral mucosal membranes  Eyes: Anicteric,   Neck: Supple,   Lungs:  Clear to auscultation  Heart: Regular rate and rhythm  Abdomen:  Soft, nontender,   Extremities: no peripheral edema, left hip pain  Neurologic: Resting quietly  Skin: No lesions  Access: RIJ permcath, maturing right forearm AVF- developing (3/21) Dr Lucky Cowboy    Basic Metabolic Panel:  Recent Labs Lab 05/10/16 0141 05/10/16 0548 05/12/16 0451 05/13/16 0952  NA 136 135 136 136  K 3.6 3.5 3.4* 3.4*  CL 100* 101 101 103  CO2 26 24 27 23   GLUCOSE 111* 112* 107* 156*  BUN 46* 44* 31* 42*   CREATININE 2.52* 2.53* 2.14* 2.51*  CALCIUM 9.6 9.2 8.8* 8.8*  MG  --   --  1.8  --   PHOS  --   --  3.3 2.7    Liver Function Tests:  Recent Labs Lab 05/10/16 0141 05/12/16 0451 05/13/16 0952  AST 39  --   --   ALT 24  --   --   ALKPHOS 150*  --   --   BILITOT 0.8  --   --   PROT 7.2  --   --   ALBUMIN 3.9 3.2* 2.9*   No results for input(s): LIPASE, AMYLASE in the last 168 hours. No results for input(s): AMMONIA in the last 168 hours.  CBC:  Recent Labs Lab 05/10/16 0141 05/10/16 0548 05/11/16 0356 05/12/16 0451 05/13/16 0602 05/13/16 0952  WBC 7.3 8.0 7.2 7.0 7.3 7.4  NEUTROABS 5.7  --   --   --   --   --   HGB 12.5 12.4 11.6* 11.5* 12.0 10.4*  HCT 37.4 36.9 35.2 35.0 35.8 31.5*  MCV 84.9 86.1 86.8 85.8 85.2 85.5  PLT 245 234 198 195 227 225    Cardiac Enzymes: No results for input(s): CKTOTAL, CKMB, CKMBINDEX, TROPONINI in the last 168 hours.  BNP: Invalid input(s): POCBNP  CBG: No results for input(s): GLUCAP in the last 168 hours.  Microbiology: Results for orders placed or performed during the hospital encounter of 05/09/16  Surgical PCR screen     Status: None   Collection Time: 05/10/16  5:39 AM  Result Value Ref Range Status   MRSA, PCR NEGATIVE NEGATIVE Final   Staphylococcus aureus NEGATIVE NEGATIVE Final    Comment:        The Xpert SA Assay (FDA approved for NASAL specimens in patients over 49 years of age), is one component of a comprehensive surveillance program.  Test performance has been validated by [facility] for patients greater than or equal to 52 year old. It is not intended to diagnose infection nor to guide or monitor treatment.     Coagulation Studies: No results for input(s): LABPROT, INR in the last 72 hours.  Urinalysis: No results for input(s): COLORURINE, LABSPEC, PHURINE, GLUCOSEU, HGBUR, BILIRUBINUR, KETONESUR, PROTEINUR, UROBILINOGEN, NITRITE, LEUKOCYTESUR in the last 72 hours.  Invalid input(s):  APPERANCEUR    Imaging: No results found.   Medications:   . sodium chloride Stopped (05/11/16 1129)   . aspirin EC  325 mg Oral Q breakfast  . ferrous sulfate  325 mg Oral Q breakfast  . levothyroxine  25 mcg Oral Q0600  . multivitamin  1 tablet Oral QHS  . potassium chloride  10 mEq Oral Once  . senna  1 tablet Oral BID  . sertraline  150 mg Oral Daily   acetaminophen **OR** acetaminophen, alum & mag hydroxide-simeth, bisacodyl, diphenoxylate-atropine, ibuprofen, magnesium hydroxide, menthol-cetylpyridinium **OR** phenol, methocarbamol, metoCLOPramide **OR** metoCLOPramide (REGLAN) injection, ondansetron **OR** ondansetron (ZOFRAN) IV, sodium phosphate, zolpidem  Assessment/ Plan:  [name] is a 83 y.o. white female with end stage renal disease on hemodialysis, hypertension, anemia of chronic kidney disease, depression, secondary hyperparathyroidism, hyperlipidemia  MWF [facility] RIJ permcath  1. End stage renal disease on hemodialysis: MWF.  Patient seen during dialysis Tolerating well   2. Anemia of chronic kidney disease: hemoglobin 10.4 - outpatient epogen.  3. Secondary Hyperparathyroidism: outpatient PTH, calcium and phos at goal.  - calcitriol - not currently on binders  4. Closed left hip fracture:  Underwent pinning on 05/10/16    LOS: 3 Beata Beason 4/18/20183:10 PM

## 2016-05-13 NOTE — Progress Notes (Signed)
Physical Therapy Treatment Patient Details [name] MRN: [id number] DOB: 1926/11/24 Today's Date: 05/13/2016    History of Present Illness Pt is an 83 y.o. female who complains of  Left hip pain following a fall at [facility]. Exam and x-rays in the ER last night reveal a slightly displaced left subcapital hip fracture with a retrograde IM femoral rod in place from surgery 6 months ago.  Pt is s/p cannulated pinning to L hip.  Pt also noted to have L hand metacarpal Fx with brace in place and a fistula to RUE for HD secondary to ESRD.     PT Comments    Pt is making limited progress towards goals secondary to fatigue from HD this date. Pt takes extended time for performance of mobility and still requires +2 assistance. Pt very talkative and difficulty to keep attention to task. Pt agreeable to ambulate to recliner. Will continue to progress as able.   Follow Up Recommendations  SNF     Equipment Recommendations  None recommended by PT    Recommendations for Other Services       Precautions / Restrictions Precautions Precautions: Fall Restrictions Weight Bearing Restrictions: Yes RLE Weight Bearing: Weight bearing as tolerated LLE Weight Bearing: Touchdown weight bearing Other Position/Activity Restrictions: Metacarpal Fx to L hand in brace, unknown WB status, use PW    Mobility  Bed Mobility Overal bed mobility: Needs Assistance Bed Mobility: Supine to Sit     Supine to sit: Mod assist;+2 for physical assistance     General bed mobility comments: Pt needs cues for sequencing and for reaching for R hand onto bed rail. Needs significant helps for sliding hips out to EOB. Tends to keep feet crossed. Once seated at EOB, favors L side with R side leaning  Transfers Overall transfer level: Needs assistance Equipment used: Left platform walker Transfers: Sit to/from Stand Sit to Stand: Mod assist;+2 safety/equipment         General transfer comment: Cues  given for sequencing. Pt able to push from seated positiong. UPright posture noted with PF RW used  Ambulation/Gait Ambulation/Gait assistance: Mod assist;+2 safety/equipment Ambulation Distance (Feet): 2 Feet Assistive device: Left platform walker Gait Pattern/deviations: Step-to pattern Gait velocity: slow   General Gait Details: Able to pivot on R foot. Needs assist for guidance of RW. +2 used   Stairs            Wheelchair Mobility    Modified Rankin (Stroke Patients Only)       Balance                                            Cognition Arousal/Alertness: Awake/alert Behavior During Therapy: WFL for tasks assessed/performed Overall Cognitive Status: Within Functional Limits for tasks assessed                                 General Comments: required verbal cues to focus, pt very talkative and acknowledged this      Exercises      General Comments        Pertinent Vitals/Pain Pain Assessment: Faces Faces Pain Scale: Hurts even more Pain Location: L knee secondary to tray table hitting leg during lunch Pain Descriptors / Indicators: Aching Pain Intervention(s): Limited activity within patient's tolerance    Home  Living                      Prior Function            PT Goals (current goals can now be found in the care plan section) Acute Rehab PT Goals Patient Stated Goal: to get better PT Goal Formulation: With patient Time For Goal Achievement: 05/24/16 Potential to Achieve Goals: Fair Progress towards PT goals: Progressing toward goals    Frequency    BID      PT Plan Current plan remains appropriate    Co-evaluation             End of Session Equipment Utilized During Treatment: Gait belt Activity Tolerance: Patient limited by fatigue;Patient limited by lethargy Patient left: in chair;with chair alarm set Nurse Communication: Mobility status PT Visit Diagnosis: Muscle weakness  (generalized) (M62.81);Other abnormalities of gait and mobility (R26.89)     Time: 7026-3785 PT Time Calculation (min) (ACUTE ONLY): 24 min  Charges:  $Gait Training: 8-22 mins $Therapeutic Activity: 8-22 mins                    G Codes:       Greggory Stallion, PT, DPT [phone]    Cornel Werber 05/13/2016, 5:05 PM

## 2016-05-13 NOTE — Progress Notes (Signed)
Subjective: 3 Days Post-Op Procedure(s) (LRB): CANNULATED HIP PINNING (Left)    Patient reports pain as mild.  Objective:   VITALS:   Vitals:   05/13/16 0935 05/13/16 1005  BP: (!) 140/43 (!) 151/50  Pulse: 60 65  Resp: 15 (!) 29  Temp:      Neurologically intact ABD soft Neurovascular intact Sensation intact distally Intact pulses distally Dorsiflexion/Plantar flexion intact Incision: no drainage  LABS  Recent Labs  05/12/16 0451 05/13/16 0602 05/13/16 0952  HGB 11.5* 12.0 10.4*  HCT 35.0 35.8 31.5*  WBC 7.0 7.3 7.4  PLT 195 227 225     Recent Labs  05/12/16 0451  NA 136  K 3.4*  BUN 31*  CREATININE 2.14*  GLUCOSE 107*    No results for input(s): LABPT, INR in the last 72 hours.   Assessment/Plan: 3 Days Post-Op Procedure(s) (LRB): CANNULATED HIP PINNING (Left)   Advance diet Up with therapy Discharge to SNF

## 2016-05-13 NOTE — Progress Notes (Signed)
Hd start 

## 2016-05-13 NOTE — Progress Notes (Signed)
OT Cancellation Note  Patient Details Name: AVELEEN NEVERS MRN: [id number] DOB: [birth year]   Cancelled Treatment:    Reason Eval/Treat Not Completed: Patient at procedure or test/ unavailable. Pt out of room for hemodialysis. Will continue to monitor and re-attempt OT treatment at later date/time as pt is available.  Jeni Salles, MPH, MS, OTR/L ascom [phone] 05/13/16, 11:38 AM

## 2016-05-13 NOTE — Progress Notes (Signed)
Plan is for patient to D/C to [facility] when medically stable. [facility] admissions coordinator at [facility] is aware of above. Clinical Social Worker (CSW) will continue to follow and assist as needed.   [employer], LCSW [phone]

## 2016-05-13 NOTE — Progress Notes (Signed)
Pre hd assessment  

## 2016-05-13 NOTE — Progress Notes (Signed)
PT Cancellation Note  Patient Details Name: HILDE CHURCHMAN MRN: [id number] DOB: [birth year]   Cancelled Treatment:    Reason Eval/Treat Not Completed: Patient at procedure or test/unavailable. Treatment attempted; pt out of room for hemodialysis. Re attempt at a later time/date.    Larae Grooms, PTA 05/13/2016, 10:51 AM

## 2016-05-13 NOTE — Progress Notes (Signed)
Patient ID: Sharon [name], female   DOB: 06/13/1926, 83 y.o.   MRN: [id number]  [facility] PROGRESS NOTE  Sharon [name]:[id number] DOB: 08-Aug-1926 DOA: 05/09/2016 PCP: [name], MD  HPI/Subjective: Not feeling well. postop day 3, not wanting to use narcotics, having severe muscle cramps, agreeable to use Robaxin and motrin  Objective: Vitals:   05/12/16 1505 05/13/16 0012  BP: (!) 134/52 (!) 147/54  Pulse: 63 66  Resp: 16 18  Temp: 98.2 F (36.8 C) 98.8 F (37.1 C)    Filed Weights   05/09/16 2341  Weight: 45.4 kg (100 lb)    ROS: Review of Systems  Constitutional: Negative for chills and fever.  Eyes: Negative for blurred vision.  Respiratory: Negative for cough and shortness of breath.   Cardiovascular: Negative for chest pain.  Gastrointestinal: Negative for abdominal pain, constipation, diarrhea, nausea and vomiting.  Genitourinary: Negative for dysuria.  Musculoskeletal: Positive for joint pain.  Neurological: Negative for dizziness and headaches.   Exam: Physical Exam  Constitutional: She is oriented to person, place, and time.  HENT:  Nose: No mucosal edema.  Mouth/Throat: No oropharyngeal exudate or posterior oropharyngeal edema.  Eyes: Conjunctivae, EOM and lids are normal. Pupils are equal, round, and reactive to light.  Neck: No JVD present. Carotid bruit is not present. No edema present. No thyroid mass and no thyromegaly present.  Cardiovascular: S1 normal and S2 normal.  Exam reveals no gallop.   No murmur heard. Pulses:      Dorsalis pedis pulses are 2+ on the right side, and 2+ on the left side.  Respiratory: No respiratory distress. She has no wheezes. She has no rhonchi. She has no rales.  GI: Soft. Bowel sounds are normal. There is no tenderness.  Musculoskeletal:       Right ankle: She exhibits no swelling.       Left ankle: She exhibits no swelling.  Lymphadenopathy:    She has no cervical adenopathy.  Neurological: She is  alert and oriented to person, place, and time. No cranial nerve deficit.  Skin: Skin is warm. No rash noted. Nails show no clubbing.  Psychiatric: She has a normal mood and affect.      Data Reviewed: Basic Metabolic Panel:  Recent Labs Lab 05/10/16 0141 05/10/16 0548 05/12/16 0451  NA 136 135 136  K 3.6 3.5 3.4*  CL 100* 101 101  CO2 26 24 27   GLUCOSE 111* 112* 107*  BUN 46* 44* 31*  CREATININE 2.52* 2.53* 2.14*  CALCIUM 9.6 9.2 8.8*  MG  --   --  1.8  PHOS  --   --  3.3   Liver Function Tests:  Recent Labs Lab 05/10/16 0141 05/12/16 0451  AST 39  --   ALT 24  --   ALKPHOS 150*  --   BILITOT 0.8  --   PROT 7.2  --   ALBUMIN 3.9 3.2*   CBC:  Recent Labs Lab 05/10/16 0141 05/10/16 0548 05/11/16 0356 05/12/16 0451 05/13/16 0602  WBC 7.3 8.0 7.2 7.0 7.3  NEUTROABS 5.7  --   --   --   --   HGB 12.5 12.4 11.6* 11.5* 12.0  HCT 37.4 36.9 35.2 35.0 35.8  MCV 84.9 86.1 86.8 85.8 85.2  PLT 245 234 198 195 227     Recent Results (from the past 240 hour(s))  Surgical PCR screen     Status: None   Collection Time: 05/10/16  5:39 AM  Result  Value Ref Range Status   MRSA, PCR NEGATIVE NEGATIVE Final   Staphylococcus aureus NEGATIVE NEGATIVE Final    Comment:        The Xpert SA Assay (FDA approved for NASAL specimens in patients over 81 years of age), is one component of a comprehensive surveillance program.  Test performance has been validated by [facility] for patients greater than or equal to 79 year old. It is not intended to diagnose infection nor to guide or monitor treatment.      Studies: No results found.  Scheduled Meds: . aspirin EC  325 mg Oral Q breakfast  . ferrous sulfate  325 mg Oral Q breakfast  . levothyroxine  25 mcg Oral Q0600  . multivitamin  1 tablet Oral QHS  . potassium chloride  10 mEq Oral Once  . senna  1 tablet Oral BID  . sertraline  150 mg Oral Daily   Continuous Infusions: . sodium chloride Stopped (05/11/16  1129)    Assessment/Plan:  1. left hip fracture pain: Status post hip pinning, postop day 3, further management per orthopedics. Add Motrin prn and use robaxin for muscle cramp 2. Hypokalemia: Replete and recheck 3. End-stage renal disease on dialysis: dialysis days are Monday Wednesday and Friday. Rt arm fistula yet to mature (2-3 more weeks) per vascular surgery eval. Patient also has access in her right chest. 4. History of cardiomyopathy and essential hypertension on metoprolol low dose. Dialysis to remove fluid 5. Depression on Zoloft 6. Hypothyroidism: Continue levothyroxine 7. History of pacemaker 8. History of Mnire's disease  Code Status: DO NOT RESUSCITATE  Family Communication: none  Disposition Plan: Rehabilitation 1-2 days postoperatively depending on clinical course  Consultants:  Orthopedic surgery  Time spent: 25 minutes  Oaklyn Jakubek [employer]

## 2016-05-13 NOTE — Progress Notes (Signed)
Post hd assessment 

## 2016-05-13 NOTE — Progress Notes (Signed)
Palliative Medicine consult noted. Due to high referral volume, there may be a delay seeing this patient. Please call the Palliative Medicine Team office at [phone] if recommendations are needed in the interim.  Thank you for inviting [place] to see this patient.  Marjie Skiff Shell Blanchette, RN, BSN, [facility] 05/13/2016 1:30 PM Cell [phone] 8:00-4:00 Monday-Friday Office [phone]

## 2016-05-13 NOTE — Progress Notes (Signed)
Pre hd info 

## 2016-05-14 DIAGNOSIS — M79602 Pain in left arm: Secondary | ICD-10-CM | POA: Diagnosis not present

## 2016-05-14 DIAGNOSIS — N186 End stage renal disease: Secondary | ICD-10-CM | POA: Diagnosis not present

## 2016-05-14 DIAGNOSIS — E039 Hypothyroidism, unspecified: Secondary | ICD-10-CM | POA: Diagnosis not present

## 2016-05-14 DIAGNOSIS — F329 Major depressive disorder, single episode, unspecified: Secondary | ICD-10-CM | POA: Diagnosis not present

## 2016-05-14 DIAGNOSIS — R262 Difficulty in walking, not elsewhere classified: Secondary | ICD-10-CM | POA: Diagnosis not present

## 2016-05-14 DIAGNOSIS — I1311 Hypertensive heart and chronic kidney disease without heart failure, with stage 5 chronic kidney disease, or end stage renal disease: Secondary | ICD-10-CM | POA: Diagnosis not present

## 2016-05-14 DIAGNOSIS — Y999 Unspecified external cause status: Secondary | ICD-10-CM | POA: Diagnosis not present

## 2016-05-14 DIAGNOSIS — I495 Sick sinus syndrome: Secondary | ICD-10-CM | POA: Diagnosis not present

## 2016-05-14 DIAGNOSIS — W19XXXD Unspecified fall, subsequent encounter: Secondary | ICD-10-CM | POA: Diagnosis not present

## 2016-05-14 DIAGNOSIS — S72002D Fracture of unspecified part of neck of left femur, subsequent encounter for closed fracture with routine healing: Secondary | ICD-10-CM | POA: Diagnosis not present

## 2016-05-14 DIAGNOSIS — R195 Other fecal abnormalities: Secondary | ICD-10-CM | POA: Diagnosis not present

## 2016-05-14 DIAGNOSIS — S7290XA Unspecified fracture of unspecified femur, initial encounter for closed fracture: Secondary | ICD-10-CM | POA: Diagnosis not present

## 2016-05-14 DIAGNOSIS — S72032A Displaced midcervical fracture of left femur, initial encounter for closed fracture: Secondary | ICD-10-CM | POA: Diagnosis not present

## 2016-05-14 DIAGNOSIS — I132 Hypertensive heart and chronic kidney disease with heart failure and with stage 5 chronic kidney disease, or end stage renal disease: Secondary | ICD-10-CM | POA: Diagnosis not present

## 2016-05-14 DIAGNOSIS — Z79899 Other long term (current) drug therapy: Secondary | ICD-10-CM | POA: Diagnosis not present

## 2016-05-14 DIAGNOSIS — M25551 Pain in right hip: Secondary | ICD-10-CM | POA: Diagnosis not present

## 2016-05-14 DIAGNOSIS — K589 Irritable bowel syndrome without diarrhea: Secondary | ICD-10-CM | POA: Diagnosis not present

## 2016-05-14 DIAGNOSIS — I10 Essential (primary) hypertension: Secondary | ICD-10-CM | POA: Diagnosis not present

## 2016-05-14 DIAGNOSIS — S299XXA Unspecified injury of thorax, initial encounter: Secondary | ICD-10-CM | POA: Diagnosis not present

## 2016-05-14 DIAGNOSIS — G4730 Sleep apnea, unspecified: Secondary | ICD-10-CM | POA: Diagnosis not present

## 2016-05-14 DIAGNOSIS — F325 Major depressive disorder, single episode, in full remission: Secondary | ICD-10-CM | POA: Diagnosis not present

## 2016-05-14 DIAGNOSIS — M1990 Unspecified osteoarthritis, unspecified site: Secondary | ICD-10-CM | POA: Diagnosis not present

## 2016-05-14 DIAGNOSIS — N185 Chronic kidney disease, stage 5: Secondary | ICD-10-CM | POA: Diagnosis not present

## 2016-05-14 DIAGNOSIS — Z85828 Personal history of other malignant neoplasm of skin: Secondary | ICD-10-CM | POA: Diagnosis not present

## 2016-05-14 DIAGNOSIS — W269XXA Contact with unspecified sharp object(s), initial encounter: Secondary | ICD-10-CM | POA: Diagnosis not present

## 2016-05-14 DIAGNOSIS — R079 Chest pain, unspecified: Secondary | ICD-10-CM | POA: Diagnosis not present

## 2016-05-14 DIAGNOSIS — Z87891 Personal history of nicotine dependence: Secondary | ICD-10-CM | POA: Diagnosis not present

## 2016-05-14 DIAGNOSIS — E782 Mixed hyperlipidemia: Secondary | ICD-10-CM | POA: Diagnosis not present

## 2016-05-14 DIAGNOSIS — Y9289 Other specified places as the place of occurrence of the external cause: Secondary | ICD-10-CM | POA: Diagnosis not present

## 2016-05-14 DIAGNOSIS — S72032D Displaced midcervical fracture of left femur, subsequent encounter for closed fracture with routine healing: Secondary | ICD-10-CM | POA: Diagnosis not present

## 2016-05-14 DIAGNOSIS — R42 Dizziness and giddiness: Secondary | ICD-10-CM | POA: Diagnosis not present

## 2016-05-14 DIAGNOSIS — N2581 Secondary hyperparathyroidism of renal origin: Secondary | ICD-10-CM | POA: Diagnosis not present

## 2016-05-14 DIAGNOSIS — S62202D Unspecified fracture of first metacarpal bone, left hand, subsequent encounter for fracture with routine healing: Secondary | ICD-10-CM | POA: Diagnosis not present

## 2016-05-14 DIAGNOSIS — I509 Heart failure, unspecified: Secondary | ICD-10-CM | POA: Diagnosis not present

## 2016-05-14 DIAGNOSIS — E441 Mild protein-calorie malnutrition: Secondary | ICD-10-CM | POA: Diagnosis not present

## 2016-05-14 DIAGNOSIS — M6281 Muscle weakness (generalized): Secondary | ICD-10-CM | POA: Diagnosis not present

## 2016-05-14 DIAGNOSIS — Z9181 History of falling: Secondary | ICD-10-CM | POA: Diagnosis not present

## 2016-05-14 DIAGNOSIS — D631 Anemia in chronic kidney disease: Secondary | ICD-10-CM | POA: Diagnosis not present

## 2016-05-14 DIAGNOSIS — M8000XD Age-related osteoporosis with current pathological fracture, unspecified site, subsequent encounter for fracture with routine healing: Secondary | ICD-10-CM | POA: Diagnosis not present

## 2016-05-14 DIAGNOSIS — I2510 Atherosclerotic heart disease of native coronary artery without angina pectoris: Secondary | ICD-10-CM | POA: Diagnosis not present

## 2016-05-14 DIAGNOSIS — Z7982 Long term (current) use of aspirin: Secondary | ICD-10-CM | POA: Diagnosis not present

## 2016-05-14 DIAGNOSIS — Z950 Presence of cardiac pacemaker: Secondary | ICD-10-CM | POA: Diagnosis not present

## 2016-05-14 DIAGNOSIS — I5022 Chronic systolic (congestive) heart failure: Secondary | ICD-10-CM | POA: Diagnosis not present

## 2016-05-14 DIAGNOSIS — I252 Old myocardial infarction: Secondary | ICD-10-CM | POA: Diagnosis not present

## 2016-05-14 DIAGNOSIS — S51812A Laceration without foreign body of left forearm, initial encounter: Secondary | ICD-10-CM | POA: Diagnosis not present

## 2016-05-14 DIAGNOSIS — Z7401 Bed confinement status: Secondary | ICD-10-CM | POA: Diagnosis not present

## 2016-05-14 DIAGNOSIS — S72002A Fracture of unspecified part of neck of left femur, initial encounter for closed fracture: Secondary | ICD-10-CM | POA: Diagnosis not present

## 2016-05-14 DIAGNOSIS — D509 Iron deficiency anemia, unspecified: Secondary | ICD-10-CM | POA: Diagnosis not present

## 2016-05-14 DIAGNOSIS — E876 Hypokalemia: Secondary | ICD-10-CM | POA: Diagnosis not present

## 2016-05-14 DIAGNOSIS — Z992 Dependence on renal dialysis: Secondary | ICD-10-CM | POA: Diagnosis not present

## 2016-05-14 DIAGNOSIS — R296 Repeated falls: Secondary | ICD-10-CM | POA: Diagnosis not present

## 2016-05-14 DIAGNOSIS — S62325D Displaced fracture of shaft of fourth metacarpal bone, left hand, subsequent encounter for fracture with routine healing: Secondary | ICD-10-CM | POA: Diagnosis not present

## 2016-05-14 DIAGNOSIS — S62325A Displaced fracture of shaft of fourth metacarpal bone, left hand, initial encounter for closed fracture: Secondary | ICD-10-CM | POA: Diagnosis not present

## 2016-05-14 DIAGNOSIS — Y939 Activity, unspecified: Secondary | ICD-10-CM | POA: Diagnosis not present

## 2016-05-14 DIAGNOSIS — W19XXXA Unspecified fall, initial encounter: Secondary | ICD-10-CM | POA: Diagnosis not present

## 2016-05-14 LAB — CBC
HCT: 34.3 % — ABNORMAL LOW (ref 35.0–47.0)
Hemoglobin: 11.2 g/dL — ABNORMAL LOW (ref 12.0–16.0)
MCH: 27.9 pg (ref 26.0–34.0)
MCHC: 32.7 g/dL (ref 32.0–36.0)
MCV: 85.4 fL (ref 80.0–100.0)
PLATELETS: 276 10*3/uL (ref 150–440)
RBC: 4.02 MIL/uL (ref 3.80–5.20)
RDW: 16.5 % — ABNORMAL HIGH (ref 11.5–14.5)
WBC: 8 10*3/uL (ref 3.6–11.0)

## 2016-05-14 LAB — BASIC METABOLIC PANEL
Anion gap: 7 (ref 5–15)
BUN: 27 mg/dL — AB (ref 6–20)
CALCIUM: 9.1 mg/dL (ref 8.9–10.3)
CO2: 27 mmol/L (ref 22–32)
Chloride: 102 mmol/L (ref 101–111)
Creatinine, Ser: 1.59 mg/dL — ABNORMAL HIGH (ref 0.44–1.00)
GFR calc Af Amer: 33 mL/min — ABNORMAL LOW (ref >60)
GFR, EST NON AFRICAN AMERICAN: 28 mL/min — AB (ref >60)
Glucose, Bld: 116 mg/dL — ABNORMAL HIGH (ref 65–99)
POTASSIUM: 3.9 mmol/L (ref 3.5–5.1)
SODIUM: 136 mmol/L (ref 135–145)

## 2016-05-14 LAB — HEPATITIS B SURFACE ANTIGEN: HEP B S AG: NEGATIVE

## 2016-05-14 MED ORDER — ASPIRIN 325 MG PO TBEC: 325.0000 mg | DELAYED_RELEASE_TABLET | Freq: Every day | ORAL | 0 refills | Status: DC

## 2016-05-14 MED ORDER — ACETAMINOPHEN 325 MG PO TABS: 650.0000 mg | ORAL_TABLET | ORAL | Status: DC | PRN

## 2016-05-14 MED ORDER — TRAMADOL HCL 50 MG PO TABS: 50.0000 mg | ORAL_TABLET | Freq: Two times a day (BID) | ORAL | 0 refills | Status: DC | PRN

## 2016-05-14 NOTE — Clinical Social Work Placement (Signed)
   CLINICAL SOCIAL WORK PLACEMENT  NOTE  Date:  05/14/2016  Patient Details  [name] MRN: [id number] Date of Birth: August 02, 1926  Clinical Social Work is seeking post-discharge placement for this patient at the [place] level of care (*CSW will initial, date and re-position this form in  chart as items are completed):  Yes   Patient/family provided with [place] Work Department's list of facilities offering this level of care within the geographic area requested by the patient (or if unable, by the patient's family).  Yes   Patient/family informed of their freedom to choose among providers that offer the needed level of care, that participate in Medicare, Medicaid or managed care program needed by the patient, have an available bed and are willing to accept the patient.  Yes   Patient/family informed of [place]'s ownership interest in [facility] and [facility], as well as of the fact that they are under no obligation to receive care at these facilities.  PASRR submitted to EDS on 05/11/16     PASRR number received on       Existing PASRR number confirmed on       FL2 transmitted to all facilities in geographic area requested by pt/family on 05/11/16     FL2 transmitted to all facilities within larger geographic area on       Patient informed that his/her managed care company has contracts with or will negotiate with certain facilities, including the following:        Yes   Patient/family informed of bed offers received.  Patient chooses bed at  [facility] )     Physician recommends and patient chooses bed at      Patient to be transferred to  ([facility]) on 05/14/16.  Patient to be transferred to facility by  [facility])     Patient family notified on 05/14/16 of transfer.  Name of family member notified:   (Patient's brother Nelle Don and wife are aware of patient's discharge today. )      PHYSICIAN       Additional Comment:    _______________________________________________ Danie Chandler, [place] Work 05/14/2016, 10:01 AM

## 2016-05-14 NOTE — Discharge Summary (Addendum)
[place] [facility] [place] [name]    MR#:  [id number]  DATE OF BIRTH:  [birth year]  DATE OF ADMISSION:  05/09/2016 ADMITTING PHYSICIAN: Saundra Shelling, MD  DATE OF DISCHARGE: 05/14/2016  PRIMARY CARE PHYSICIAN: [name], MD    ADMISSION DIAGNOSIS:  Pain [R52] Fall [W19.XXXA] Hemodialysis patient ([place]) [Z99.2] Closed displaced fracture of left femoral neck ([place]) [S72.002A]  DISCHARGE DIAGNOSIS:  Active Problems:   Hip fracture ([place])   SECONDARY DIAGNOSIS:   Past Medical History:  Diagnosis Date  . Arthritis   . CAD (coronary artery disease)   . Cancer ([place])    skin  . Cardiomyopathy ([place])   . Depression   . IBS (irritable bowel syndrome) 2010  . Kidney failure July 2012   Hemodialysis 3xweek  . Kidney failure   . Meniere disease   . Meniere's disease   . Myocardial infarction ([place])   . OSA (obstructive sleep apnea)    CPAP  . Peritoneal dialysis status ([place])   . Presence of permanent cardiac pacemaker     HOSPITAL COURSE:   1. Left hip fracture requiring operative repair. Patient states that she is a little sleepy. She does not want to take any pain medications. My associate added Motrin and Robaxin yesterday but she sleepy today and does not want to take it. Patient is against pain medications. Tylenol as needed for pain. At the facility she is also listed for tramadol which we can use for pain also. Dr. Sabra Heck uses aspirin for DVT prophylaxis. Follow-up with Dr. Sabra Heck orthopedic and 2 weeks 2. End-stage renal disease on dialysis Monday Wednesday Friday. Once right arm fistula is mature can hopefully take out the access in her right chest. 3. History of cardiomyopathy and essential hypertension on low-dose metoprolol. Dialysis to remove fluid 4. Depression on Zoloft 5. Hypothyroidism unspecified on levothyroxine 6. History of pacemaker 7. History of Mnire's disease 8. Hypokalemia better today 9. Previous  left fourth metacarpal fracture   DISCHARGE CONDITIONS:   Satisfactory  CONSULTS OBTAINED:  Treatment Team:  Earnestine Leys, MD Lavonia Dana, MD  DRUG ALLERGIES:   Allergies  Allergen Reactions  . Statins Other (See Comments)    Muscle weakness severe  . Effexor [Venlafaxine]   . Codeine Other (See Comments)    GI UPSET    DISCHARGE MEDICATIONS:   Current Discharge Medication List    START taking these medications   Details  aspirin EC 325 MG EC tablet Take 1 tablet (325 mg total) by mouth daily with breakfast. Qty: 30 tablet, Refills: 0      CONTINUE these medications which have CHANGED   Details  acetaminophen (TYLENOL) 325 MG tablet Take 2 tablets (650 mg total) by mouth every 4 (four) hours as needed for mild pain or moderate pain.    traMADol (ULTRAM) 50 MG tablet Take 1 tablet (50 mg total) by mouth every 12 (twelve) hours as needed for severe pain. Qty: 10 tablet, Refills: 0      CONTINUE these medications which have NOT CHANGED   Details  calcitRIOL (ROCALTROL) 0.25 MCG capsule Take 0.25 mcg by mouth daily.    diphenoxylate-atropine (LOMOTIL) 2.5-0.025 MG tablet TAKE 1 TABLET BY MOUTH FOUR TIMES DAILY AS NEEDED FOR DIARRHEA OR LOOSE STOOLS Qty: 30 tablet, Refills: 5    folic acid-vitamin b complex-vitamin c-selenium-zinc (DIALYVITE) 3 MG TABS tablet Take 1 tablet by mouth daily. Qty: 30 tablet, Refills: 5    guaifenesin (ROBITUSSIN) 100  MG/5ML syrup Take 200 mg by mouth every 4 (four) hours as needed for cough.    levothyroxine (SYNTHROID, LEVOTHROID) 25 MCG tablet Take 1 tablet (25 mcg total) by mouth daily. Qty: 90 tablet, Refills: 1    lidocaine (LIDODERM) 5 % Place 1 patch onto the skin every 12 (twelve) hours. Remove & Discard patch within 12 hours or as directed by MD Qty: 10 patch, Refills: 0    metoprolol succinate (TOPROL XL) 25 MG 24 hr tablet TAKE 1/2 BY MOUTH DAILY Qty: 30 tablet, Refills: 11    multivitamin-iron-minerals-folic acid  (THERAPEUTIC-M) TABS tablet Take 1 tablet by mouth daily.    potassium chloride (K-DUR,KLOR-CON) 10 MEQ tablet Take 10 mEq by mouth once.    sertraline (ZOLOFT) 50 MG tablet TAKE 3 TABLETS(150 MG) BY MOUTH DAILY Qty: 90 tablet, Refills: 2    Vitamin D, Ergocalciferol, (DRISDOL) 50000 units CAPS capsule Take 50,000 Units by mouth every 7 (seven) days.       STOP taking these medications     senna-docusate (SENOKOT-S) 8.6-50 MG tablet          DISCHARGE INSTRUCTIONS:   Follow-up with Dr. Earnestine Leys 2 weeks. Follow-up with Dr. at rehabilitation facility 1 day  If you experience worsening of your admission symptoms, develop shortness of breath, life threatening emergency, suicidal or homicidal thoughts you must seek medical attention immediately by calling 911 or calling your MD immediately  if symptoms less severe.  You Must read complete instructions/literature along with all the possible adverse reactions/side effects for all the Medicines you take and that have been prescribed to you. Take any new Medicines after you have completely understood and accept all the possible adverse reactions/side effects.   Please note  You were cared for by a hospitalist during your hospital stay. If you have any questions about your discharge medications or the care you received while you were in the hospital after you are discharged, you can call the unit and asked to speak with the hospitalist on call if the hospitalist that took care of you is not available. Once you are discharged, your primary care physician will handle any further medical issues. Please note that NO REFILLS for any discharge medications will be authorized once you are discharged, as it is imperative that you return to your primary care physician (or establish a relationship with a primary care physician if you do not have one) for your aftercare needs so that they can reassess your need for medications and monitor your lab  values.    Today   CHIEF COMPLAINT:   Chief Complaint  Patient presents with  . Fall    HISTORY OF PRESENT ILLNESS:  Sharon [name]  is a 83 y.o. female with a known history of Presented after a fall and having left hip pain and left wrist pain   VITAL SIGNS:  Blood pressure 136/78, pulse 69, temperature 98.8 F (37.1 C), temperature source Oral, resp. rate 18, height 5\' 4"  (1.626 m), weight 45.4 kg (100 lb), SpO2 96 %.   PHYSICAL EXAMINATION:  GENERAL:  83 y.o.-year-old patient lying in the bed with no acute distress.  EYES: Pupils equal, round, reactive to light and accommodation. No scleral icterus. Extraocular muscles intact.  HEENT: Head atraumatic, normocephalic. Oropharynx and nasopharynx clear.  NECK:  Supple, no jugular venous distention. No thyroid enlargement, no tenderness.  LUNGS: Normal breath sounds bilaterally, no wheezing, rales,rhonchi or crepitation. No use of accessory muscles of respiration.  CARDIOVASCULAR: S1, S2  normal. No murmurs, rubs, or gallops.  ABDOMEN: Soft, non-tender, non-distended. Bowel sounds present. No organomegaly or mass.  EXTREMITIES: No pedal edema, cyanosis, or clubbing.  NEUROLOGIC: Cranial nerves II through XII are intact. Sensation intact. Gait not checked.  PSYCHIATRIC: The patient is alert and oriented x 3.  SKIN: No obvious rash, lesion, or ulcer.   DATA REVIEW:   CBC  Recent Labs Lab 05/14/16 0525  WBC 8.0  HGB 11.2*  HCT 34.3*  PLT 276    Chemistries   Recent Labs Lab 05/10/16 0141  05/12/16 0451  05/14/16 0525  NA 136  < > 136  < > 136  K 3.6  < > 3.4*  < > 3.9  CL 100*  < > 101  < > 102  CO2 26  < > 27  < > 27  GLUCOSE 111*  < > 107*  < > 116*  BUN 46*  < > 31*  < > 27*  CREATININE 2.52*  < > 2.14*  < > 1.59*  CALCIUM 9.6  < > 8.8*  < > 9.1  MG  --   --  1.8  --   --   AST 39  --   --   --   --   ALT 24  --   --   --   --   ALKPHOS 150*  --   --   --   --   BILITOT 0.8  --   --   --   --   < > =  values in this interval not displayed.   Microbiology Results  Results for orders placed or performed during the hospital encounter of 05/09/16  Surgical PCR screen     Status: None   Collection Time: 05/10/16  5:39 AM  Result Value Ref Range Status   MRSA, PCR NEGATIVE NEGATIVE Final   Staphylococcus aureus NEGATIVE NEGATIVE Final    Comment:        The Xpert SA Assay (FDA approved for NASAL specimens in patients over 44 years of age), is one component of a comprehensive surveillance program.  Test performance has been validated by [facility] for patients greater than or equal to 42 year old. It is not intended to diagnose infection nor to guide or monitor treatment.      Management plans discussed with the patient, And she is in agreement.  CODE STATUS:     Code Status Orders        Start     Ordered   05/12/16 0810  Do not attempt resuscitation (DNR)  Continuous    Question Answer Comment  In the event of cardiac or respiratory ARREST Do not call a "code blue"   In the event of cardiac or respiratory ARREST Do not perform Intubation, CPR, defibrillation or ACLS   In the event of cardiac or respiratory ARREST Use medication by any route, position, wound care, and other measures to relive pain and suffering. May use oxygen, suction and manual treatment of airway obstruction as needed for comfort.      05/12/16 0809    Code Status History    Date Active Date Inactive Code Status Order ID Comments User Context   05/10/2016  5:06 AM 05/12/2016  8:09 AM Full Code [id number]  Saundra Shelling, MD Inpatient   12/15/2015  3:15 PM 12/19/2015  1:59 AM Full Code [id number]  Oletta Cohn, DO Inpatient   12/14/2015  4:13 PM 12/15/2015  3:15 PM Full  Code [id number]  Bettey Costa, MD Inpatient    Advance Directive Documentation     Most Recent Value  Type of Advance Directive  Healthcare Power of Attorney  Pre-existing out of facility DNR order (yellow form or pink MOST form)  -   "MOST" Form in Place?  -      TOTAL TIME TAKING CARE OF THIS PATIENT: 35 minutes.    Loletha Grayer M.D on 05/14/2016 at 10:31 AM  Between 7am to 6pm - Pager - [phone]  After 6pm go to [email or web address] - password [email or web address] [facility] [place] [facility]  [phone]  CC: Primary care physician; [name], MD

## 2016-05-14 NOTE — Progress Notes (Signed)
Patient is medically stable for discharge today to [facility]. Per Magda Paganini, admissions coordinator at [facility], patient can come to room 507. Estill Bamberg with [facility] will coordinate with facility about dialysis. Social work Patent examiner discharge orders in [place]. Social work Theatre manager met with patient at bedside and explained patient will discharge today to [facility]. Patient verbally agreed she understood. Social work Theatre manager notified patient's brother Nelle Don and wife about patient's discharge today. RN will call report and arrange EMS for transport. Please re-consult if future social work needs arise. Social work Architectural technologist off.   Danie Chandler, Social Work Intern  [phone]

## 2016-05-14 NOTE — Progress Notes (Signed)
[facility]  ROUNDING NOTE   Subjective:   [name] admitted to [facility] on 05/09/2016 for Pain [R52] Fall [W19.XXXA] Hemodialysis patient [facility]) [Z99.2] Closed displaced fracture of left femoral neck ([place]) [S72.002A]   Patient underwent Left hip Pinning by Dr Sabra Heck on 4/15     very sleepy this morning     Objective:  Vital signs in last 24 hours:  Temp:  [97.7 F (36.5 C)-98.8 F (37.1 C)] 98.8 F (37.1 C) (04/19 0759) Pulse Rate:  [60-77] 69 (04/19 0759) Resp:  [10-29] 18 (04/19 0759) BP: (125-159)/(40-78) 136/78 (04/19 0759) SpO2:  [96 %-100 %] 96 % (04/19 0759)  Weight change:  Filed Weights   05/09/16 2341  Weight: 45.4 kg (100 lb)    Intake/Output: I/O last 3 completed shifts: In: 720 [P.O.:720] Out: 1000 [Other:1000]   Intake/Output this shift:  No intake/output data recorded.  Physical Exam: General: NAD,   Head: Normocephalic, atraumatic. Moist oral mucosal membranes  Eyes: Anicteric,   Neck: Supple,   Lungs:  Clear to auscultation  Heart: Regular rate and rhythm  Abdomen:  Soft, nontender,   Extremities: no peripheral edema, left hip pain  Neurologic: Resting quietly  Skin: No lesions  Access: RIJ permcath, maturing right forearm AVF- developing (3/21) Dr Lucky Cowboy    Basic Metabolic Panel:  Recent Labs Lab 05/10/16 0141 05/10/16 0548 05/12/16 0451 05/13/16 0952 05/14/16 0525  NA 136 135 136 136 136  K 3.6 3.5 3.4* 3.4* 3.9  CL 100* 101 101 103 102  CO2 26 24 27 23 27   GLUCOSE 111* 112* 107* 156* 116*  BUN 46* 44* 31* 42* 27*  CREATININE 2.52* 2.53* 2.14* 2.51* 1.59*  CALCIUM 9.6 9.2 8.8* 8.8* 9.1  MG  --   --  1.8  --   --   PHOS  --   --  3.3 2.7  --     Liver Function Tests:  Recent Labs Lab 05/10/16 0141 05/12/16 0451 05/13/16 0952  AST 39  --   --   ALT 24  --   --   ALKPHOS 150*  --   --   BILITOT 0.8  --   --   PROT 7.2  --   --   ALBUMIN 3.9 3.2* 2.9*   No results for input(s): LIPASE, AMYLASE  in the last 168 hours. No results for input(s): AMMONIA in the last 168 hours.  CBC:  Recent Labs Lab 05/10/16 0141  05/11/16 0356 05/12/16 0451 05/13/16 0602 05/13/16 0952 05/14/16 0525  WBC 7.3  < > 7.2 7.0 7.3 7.4 8.0  NEUTROABS 5.7  --   --   --   --   --   --   HGB 12.5  < > 11.6* 11.5* 12.0 10.4* 11.2*  HCT 37.4  < > 35.2 35.0 35.8 31.5* 34.3*  MCV 84.9  < > 86.8 85.8 85.2 85.5 85.4  PLT 245  < > 198 195 227 225 276  < > = values in this interval not displayed.  Cardiac Enzymes: No results for input(s): CKTOTAL, CKMB, CKMBINDEX, TROPONINI in the last 168 hours.  BNP: Invalid input(s): POCBNP  CBG: No results for input(s): GLUCAP in the last 168 hours.  Microbiology: Results for orders placed or performed during the hospital encounter of 05/09/16  Surgical PCR screen     Status: None   Collection Time: 05/10/16  5:39 AM  Result Value Ref Range Status   MRSA, PCR NEGATIVE NEGATIVE Final  Staphylococcus aureus NEGATIVE NEGATIVE Final    Comment:        The Xpert SA Assay (FDA approved for NASAL specimens in patients over 79 years of age), is one component of a comprehensive surveillance program.  Test performance has been validated by [facility] for patients greater than or equal to 69 year old. It is not intended to diagnose infection nor to guide or monitor treatment.     Coagulation Studies: No results for input(s): LABPROT, INR in the last 72 hours.  Urinalysis: No results for input(s): COLORURINE, LABSPEC, PHURINE, GLUCOSEU, HGBUR, BILIRUBINUR, KETONESUR, PROTEINUR, UROBILINOGEN, NITRITE, LEUKOCYTESUR in the last 72 hours.  Invalid input(s): APPERANCEUR    Imaging: No results found.   Medications:   . sodium chloride Stopped (05/11/16 1129)   . aspirin EC  325 mg Oral Q breakfast  . ferrous sulfate  325 mg Oral Q breakfast  . levothyroxine  25 mcg Oral Q0600  . multivitamin  1 tablet Oral QHS  . potassium chloride  10 mEq Oral Once   . senna  1 tablet Oral BID  . sertraline  150 mg Oral Daily   acetaminophen **OR** acetaminophen, alum & mag hydroxide-simeth, bisacodyl, diphenoxylate-atropine, ibuprofen, magnesium hydroxide, menthol-cetylpyridinium **OR** phenol, methocarbamol, metoCLOPramide **OR** metoCLOPramide (REGLAN) injection, ondansetron **OR** ondansetron (ZOFRAN) IV, sodium phosphate, zolpidem  Assessment/ Plan:  Sharon [name] is a 83 y.o. white female with end stage renal disease on hemodialysis, hypertension, anemia of chronic kidney disease, depression, secondary hyperparathyroidism, hyperlipidemia  MWF [facility] RIJ permcath  1. End stage renal disease on hemodialysis: MWF.  Expected to be discharged today [facility] as outpatient  2. Anemia of chronic kidney disease: hemoglobin 11.2 - outpatient epogen.  3. Secondary Hyperparathyroidism:   - not currently on binders  4. Closed left hip fracture:  Underwent pinning on 05/10/16    LOS: 4 [name] 4/19/20189:35 AM

## 2016-05-14 NOTE — Progress Notes (Signed)
PT Cancellation Note  Patient Details [name] MRN: [id number] DOB: 07/13/1926   Cancelled Treatment:    Reason Eval/Treat Not Completed: Fatigue/lethargy limiting ability to participate. Treatment attempted; unable to awaken pt at all despite continued efforts. Spoke with nursing after attempt; nursing notes pt had a muscle relaxer last night and has been in this obtunded state since. At the time of this document, pt has already been discharged to a skilled nursing facility.    Larae Grooms, PTA 05/14/2016, 11:57 AM

## 2016-05-14 NOTE — Plan of Care (Signed)
Problem: Pain Managment: Goal: General experience of comfort will improve Outcome: Progressing Patient not complaining of any pain.   Deri Fuelling, RN

## 2016-05-14 NOTE — Care Management Important Message (Signed)
Important Message  Patient Details  Name: BENNIE SCAFF MRN: [id number] Date of Birth: [birth year]   Medicare Important Message Given:  Yes    Jolly Mango, RN 05/14/2016, 9:44 AM

## 2016-05-14 NOTE — Progress Notes (Signed)
RN gave report to RN at [facility], all questions answered. Patient alert and oriented. Drowsy from muscle relaxer this morning. Patient not complaining of any pain. Dressing clean dry and intact. EMS called for transport, patient aware.   Deri Fuelling, RN

## 2016-05-15 DIAGNOSIS — Z992 Dependence on renal dialysis: Secondary | ICD-10-CM | POA: Diagnosis not present

## 2016-05-15 DIAGNOSIS — D509 Iron deficiency anemia, unspecified: Secondary | ICD-10-CM | POA: Diagnosis not present

## 2016-05-15 DIAGNOSIS — N185 Chronic kidney disease, stage 5: Secondary | ICD-10-CM | POA: Diagnosis not present

## 2016-05-15 DIAGNOSIS — N2581 Secondary hyperparathyroidism of renal origin: Secondary | ICD-10-CM | POA: Diagnosis not present

## 2016-05-15 DIAGNOSIS — E441 Mild protein-calorie malnutrition: Secondary | ICD-10-CM | POA: Diagnosis not present

## 2016-05-15 DIAGNOSIS — N186 End stage renal disease: Secondary | ICD-10-CM | POA: Diagnosis not present

## 2016-05-15 DIAGNOSIS — M8000XD Age-related osteoporosis with current pathological fracture, unspecified site, subsequent encounter for fracture with routine healing: Secondary | ICD-10-CM | POA: Diagnosis not present

## 2016-05-15 DIAGNOSIS — F325 Major depressive disorder, single episode, in full remission: Secondary | ICD-10-CM | POA: Diagnosis not present

## 2016-05-15 DIAGNOSIS — I5022 Chronic systolic (congestive) heart failure: Secondary | ICD-10-CM | POA: Diagnosis not present

## 2016-05-15 DIAGNOSIS — D631 Anemia in chronic kidney disease: Secondary | ICD-10-CM | POA: Diagnosis not present

## 2016-05-15 DIAGNOSIS — R296 Repeated falls: Secondary | ICD-10-CM | POA: Diagnosis not present

## 2016-05-18 DIAGNOSIS — N2581 Secondary hyperparathyroidism of renal origin: Secondary | ICD-10-CM | POA: Diagnosis not present

## 2016-05-18 DIAGNOSIS — D631 Anemia in chronic kidney disease: Secondary | ICD-10-CM | POA: Diagnosis not present

## 2016-05-18 DIAGNOSIS — N186 End stage renal disease: Secondary | ICD-10-CM | POA: Diagnosis not present

## 2016-05-18 DIAGNOSIS — D509 Iron deficiency anemia, unspecified: Secondary | ICD-10-CM | POA: Diagnosis not present

## 2016-05-18 DIAGNOSIS — Z992 Dependence on renal dialysis: Secondary | ICD-10-CM | POA: Diagnosis not present

## 2016-05-20 DIAGNOSIS — N2581 Secondary hyperparathyroidism of renal origin: Secondary | ICD-10-CM | POA: Diagnosis not present

## 2016-05-20 DIAGNOSIS — Z992 Dependence on renal dialysis: Secondary | ICD-10-CM | POA: Diagnosis not present

## 2016-05-20 DIAGNOSIS — N186 End stage renal disease: Secondary | ICD-10-CM | POA: Diagnosis not present

## 2016-05-20 DIAGNOSIS — D509 Iron deficiency anemia, unspecified: Secondary | ICD-10-CM | POA: Diagnosis not present

## 2016-05-20 DIAGNOSIS — D631 Anemia in chronic kidney disease: Secondary | ICD-10-CM | POA: Diagnosis not present

## 2016-05-21 ENCOUNTER — Other Ambulatory Visit: Payer: Self-pay | Admitting: *Deleted

## 2016-05-21 ENCOUNTER — Telehealth: Payer: Self-pay | Admitting: Family Medicine

## 2016-05-21 NOTE — Patient Outreach (Signed)
Spoke with Drue Novel, SW at facility, she reports that patient is a LTC resident at facility.  She had a fall with fracture and is getting ST rehab at this time.  Patient will return to LTC/ALF side of building upon discharge from SNF.   Plan to sign off as no [facility] care management needs identified at this time. Royetta Crochet. Laymond Purser, RN, BSN, [place] [phone]) Business Cell  [phone]) Toll Free Office

## 2016-05-21 NOTE — Telephone Encounter (Signed)
Error. Papers are in Dr. Ellen Henri color folder and the message is also for Dr. Caryl Bis.

## 2016-05-21 NOTE — Telephone Encounter (Signed)
Pt's brother dropped off a summary report from [facility]. He wanted Dr. Derrel Nip to be informed. Paper work is up front in [employer]   Pt's brother, who is also POA is also requesting if pt's medications can be generic brand if possible.  Santina Evans is POA and you can call him at [phone].

## 2016-05-22 DIAGNOSIS — D631 Anemia in chronic kidney disease: Secondary | ICD-10-CM | POA: Diagnosis not present

## 2016-05-22 DIAGNOSIS — N186 End stage renal disease: Secondary | ICD-10-CM | POA: Diagnosis not present

## 2016-05-22 DIAGNOSIS — Z992 Dependence on renal dialysis: Secondary | ICD-10-CM | POA: Diagnosis not present

## 2016-05-22 DIAGNOSIS — N2581 Secondary hyperparathyroidism of renal origin: Secondary | ICD-10-CM | POA: Diagnosis not present

## 2016-05-22 DIAGNOSIS — D509 Iron deficiency anemia, unspecified: Secondary | ICD-10-CM | POA: Diagnosis not present

## 2016-05-22 NOTE — Telephone Encounter (Signed)
Tried calling, no vm 

## 2016-05-22 NOTE — Telephone Encounter (Signed)
I am happy for all her medications to be generic if possible. Typically once a patient is in SNF they follow with the physician at the SNF. I am more than happy to see the patient in the office as well, though wanted to see if the patient is going to continue to see me as well as see the SNF physician. Thanks.

## 2016-05-22 NOTE — Telephone Encounter (Signed)
Placed in your red folder.

## 2016-05-25 DIAGNOSIS — N2581 Secondary hyperparathyroidism of renal origin: Secondary | ICD-10-CM | POA: Diagnosis not present

## 2016-05-25 DIAGNOSIS — D509 Iron deficiency anemia, unspecified: Secondary | ICD-10-CM | POA: Diagnosis not present

## 2016-05-25 DIAGNOSIS — Z992 Dependence on renal dialysis: Secondary | ICD-10-CM | POA: Diagnosis not present

## 2016-05-25 DIAGNOSIS — N186 End stage renal disease: Secondary | ICD-10-CM | POA: Diagnosis not present

## 2016-05-25 DIAGNOSIS — D631 Anemia in chronic kidney disease: Secondary | ICD-10-CM | POA: Diagnosis not present

## 2016-05-25 NOTE — Telephone Encounter (Signed)
Patients brother will contact Dr.Anderson to manage medications and will let us know if he needs anything

## 2016-05-26 DIAGNOSIS — S72032A Displaced midcervical fracture of left femur, initial encounter for closed fracture: Secondary | ICD-10-CM | POA: Diagnosis not present

## 2016-05-26 DIAGNOSIS — S72009A Fracture of unspecified part of neck of unspecified femur, initial encounter for closed fracture: Secondary | ICD-10-CM | POA: Insufficient documentation

## 2016-05-26 DIAGNOSIS — S62325A Displaced fracture of shaft of fourth metacarpal bone, left hand, initial encounter for closed fracture: Secondary | ICD-10-CM | POA: Diagnosis not present

## 2016-05-27 DIAGNOSIS — D631 Anemia in chronic kidney disease: Secondary | ICD-10-CM | POA: Diagnosis not present

## 2016-05-27 DIAGNOSIS — Z992 Dependence on renal dialysis: Secondary | ICD-10-CM | POA: Diagnosis not present

## 2016-05-27 DIAGNOSIS — N186 End stage renal disease: Secondary | ICD-10-CM | POA: Diagnosis not present

## 2016-05-27 DIAGNOSIS — D509 Iron deficiency anemia, unspecified: Secondary | ICD-10-CM | POA: Diagnosis not present

## 2016-05-29 DIAGNOSIS — Z992 Dependence on renal dialysis: Secondary | ICD-10-CM | POA: Diagnosis not present

## 2016-05-29 DIAGNOSIS — D631 Anemia in chronic kidney disease: Secondary | ICD-10-CM | POA: Diagnosis not present

## 2016-05-29 DIAGNOSIS — N186 End stage renal disease: Secondary | ICD-10-CM | POA: Diagnosis not present

## 2016-05-29 DIAGNOSIS — D509 Iron deficiency anemia, unspecified: Secondary | ICD-10-CM | POA: Diagnosis not present

## 2016-05-30 ENCOUNTER — Encounter: Payer: Self-pay | Admitting: Emergency Medicine

## 2016-05-30 ENCOUNTER — Emergency Department: Payer: Medicare Other

## 2016-05-30 ENCOUNTER — Emergency Department
Admission: EM | Admit: 2016-05-30 | Discharge: 2016-05-30 | Disposition: A | Discharge status: Home / Self Care | Payer: Medicare Other | Attending: Emergency Medicine | Admitting: Emergency Medicine

## 2016-05-30 DIAGNOSIS — Y939 Activity, unspecified: Secondary | ICD-10-CM | POA: Insufficient documentation

## 2016-05-30 DIAGNOSIS — S51812A Laceration without foreign body of left forearm, initial encounter: Secondary | ICD-10-CM | POA: Insufficient documentation

## 2016-05-30 DIAGNOSIS — Y9289 Other specified places as the place of occurrence of the external cause: Secondary | ICD-10-CM | POA: Insufficient documentation

## 2016-05-30 DIAGNOSIS — Y999 Unspecified external cause status: Secondary | ICD-10-CM | POA: Insufficient documentation

## 2016-05-30 DIAGNOSIS — Z992 Dependence on renal dialysis: Secondary | ICD-10-CM | POA: Insufficient documentation

## 2016-05-30 DIAGNOSIS — W19XXXA Unspecified fall, initial encounter: Secondary | ICD-10-CM

## 2016-05-30 DIAGNOSIS — N186 End stage renal disease: Secondary | ICD-10-CM | POA: Insufficient documentation

## 2016-05-30 DIAGNOSIS — I132 Hypertensive heart and chronic kidney disease with heart failure and with stage 5 chronic kidney disease, or end stage renal disease: Secondary | ICD-10-CM | POA: Insufficient documentation

## 2016-05-30 DIAGNOSIS — R42 Dizziness and giddiness: Secondary | ICD-10-CM | POA: Diagnosis not present

## 2016-05-30 DIAGNOSIS — W269XXA Contact with unspecified sharp object(s), initial encounter: Secondary | ICD-10-CM | POA: Insufficient documentation

## 2016-05-30 DIAGNOSIS — I2510 Atherosclerotic heart disease of native coronary artery without angina pectoris: Secondary | ICD-10-CM | POA: Insufficient documentation

## 2016-05-30 DIAGNOSIS — Z87891 Personal history of nicotine dependence: Secondary | ICD-10-CM | POA: Insufficient documentation

## 2016-05-30 DIAGNOSIS — Z79899 Other long term (current) drug therapy: Secondary | ICD-10-CM | POA: Insufficient documentation

## 2016-05-30 DIAGNOSIS — I5022 Chronic systolic (congestive) heart failure: Secondary | ICD-10-CM | POA: Insufficient documentation

## 2016-05-30 DIAGNOSIS — R079 Chest pain, unspecified: Secondary | ICD-10-CM | POA: Diagnosis not present

## 2016-05-30 DIAGNOSIS — S299XXA Unspecified injury of thorax, initial encounter: Secondary | ICD-10-CM | POA: Diagnosis not present

## 2016-05-30 DIAGNOSIS — I252 Old myocardial infarction: Secondary | ICD-10-CM | POA: Insufficient documentation

## 2016-05-30 DIAGNOSIS — Z7982 Long term (current) use of aspirin: Secondary | ICD-10-CM | POA: Insufficient documentation

## 2016-05-30 MED ORDER — BACITRACIN ZINC 500 UNIT/GM EX OINT
TOPICAL_OINTMENT | CUTANEOUS | Status: AC
  Filled 2016-05-30: qty 0.9

## 2016-05-30 MED ORDER — METOCLOPRAMIDE HCL 5 MG PO TABS: 5.0000 mg | ORAL_TABLET | Freq: Three times a day (TID) | ORAL | 0 refills | Status: DC | PRN

## 2016-05-30 MED ORDER — METOCLOPRAMIDE HCL 10 MG PO TABS
5.0000 mg | ORAL_TABLET | Freq: Once | ORAL | Status: AC
  Administered 2016-05-30: 5 mg via ORAL
  Filled 2016-05-30: qty 1

## 2016-05-30 NOTE — ED Notes (Signed)
Pt states she had an episode of vertigo when she fell yesterday. Pt is alert and oriented at this time, able to answer all questions appropriately at this time.

## 2016-05-30 NOTE — ED Triage Notes (Signed)
Pt presents to ED via [facility] with c/o fall yesterday 05/29/2016. Per EMS pt has swelling and bleeding to both arms, EMS states that family reported bleeding from fistula controlled with bandaid. Per EMS pt is alert and oriented x 4 at this time.

## 2016-05-30 NOTE — Discharge Instructions (Signed)
Your exam is essentially normal today. You have sustained a skin tear and some bruising under the skin. Keep the wound clean and covered. Use the non-stick-petroleum dressing daily. Apply cool compresses to reduce swelling. Take the meclizine tablet for vertigo as needed. Follow-up with Dr. Caryl Bis, Dr. Tami Ribas, or the provider at [facility] for further vertigo management. Return to the ED as needed.

## 2016-05-30 NOTE — ED Notes (Signed)
This RN on unit when heard screaming coming from the bathroom. Pt had been assisted to the bathroom by the provider when she fell. This RN and provider immediately beside the patient. Pt found on floor, alert and speaking in NAD. This RN and provider assisted patient to stand and clean patient up, pt had incontinence of bowels and urine. Pt's niece also present for patient fall. Pt denies any injury at this time, repeatedly states, "I'm fine!".

## 2016-05-30 NOTE — ED Provider Notes (Signed)
[facility] Emergency Department Provider Note ____________________________________________  Time seen: 1512  I have reviewed the triage vital signs and the nursing notes.  HISTORY  Chief Complaint  Fall and Arm Injury  HPI Sharon [name] is a 83 y.o. female Presents to the ED via EMS from [facility], her skilled nursing rehabilitation facility, for evaluation of skin tear following a fall last night. The patient describes falling in her room, She had a bout of vertigo. She presents with bleeding to the left dorsal forearm secondary to a skin tear. She also has concerns for a smaller skin tear to her right radial wrist, from a fall a few days prior. She had some concerns because there is a skin tear and there was bleeding in that same area. She has a recently grafted AV fistula On the right radial wrist, which isset to be used on Monday for dialysis. She denies any serious head injury related to this particular fall. The area where she did have a small abrasion to the right forehead and temple, has some resolving ecchymosis from her fall about 3 weeks prior. She denies any loss of consciousness, nausea, vomiting, or weakness. She does describes her vertigo, related to meniere's appears to be worsening, which has led to her falls as of late. She had a recent left total hip replacement, but denies any hip or leg injury at this time.  Past Medical History:  Diagnosis Date  . Arthritis   . CAD (coronary artery disease)   . Cancer ([place])    skin  . Cardiomyopathy ([place])   . Depression   . IBS (irritable bowel syndrome) 2010  . Kidney failure July 2012   Hemodialysis 3xweek  . Kidney failure   . Meniere disease   . Meniere's disease   . Myocardial infarction ([place])   . OSA (obstructive sleep apnea)    CPAP  . Peritoneal dialysis status ([place])   . Presence of permanent cardiac pacemaker     Patient Active Problem List   Diagnosis Date Noted  . Hip fracture  ([place]) 05/10/2016  . Falls 04/27/2016  . Left hand pain 04/27/2016  . Head injury 04/27/2016  . ESRD on dialysis ([place]) 04/10/2016  . Pressure injury of skin 12/15/2015  . Closed left subtrochanteric femur fracture ([place]) 12/15/2015  . Femur fracture, left ([place]) 12/14/2015  . Meniere disease   . Loss of weight 10/21/2015  . Skin tear of elbow without complication 25/42/7062  . Clinical depression 06/20/2015  . Combined fat and carbohydrate induced hyperlipemia 06/20/2015  . Dysphagia 05/24/2015  . Dyspnea 05/24/2015  . Imbalance 04/22/2015  . Electrical shock sensation in right posterior head 11/22/2014  . Fall at home 11/15/2014  . Expressive aphasia 10/04/2014  . Breathlessness on exertion 09/13/2014  . Chronic pain syndrome 05/22/2014  . Insomnia 03/13/2014  . Anxiety state 03/13/2014  . Arteriosclerosis of coronary artery 01/15/2014  . Chronic systolic heart failure ([place]) 01/15/2014  . Obstructive apnea 01/15/2014  . Basal cell carcinoma of neck 11/14/2013  . Degeneration of intervertebral disc of cervical region 11/07/2013  . Cervical radiculitis 10/16/2013  . Pelvic pain in female 09/12/2013  . Neck pain of over 3 months duration 09/12/2013  . Memory loss 09/12/2013  . Systolic heart failure, chronic ([place]) 05/12/2013  . Medicare annual wellness visit, subsequent 11/10/2012  . Sinus node dysfunction ([place]) 08/01/2012  . Low back pain 08/01/2012  . Cervical spine pain 05/02/2012  . Irritable bowel syndrome 11/02/2011  .  Depression 04/01/2011  . Hypertension 04/01/2011  . Menopausal disorder 04/01/2011  . Screening for breast cancer 04/01/2011  . Chronic kidney disease, stage 5, kidney failure ([place]) 04/01/2011    Past Surgical History:  Procedure Laterality Date  . [place]  . ANUS SURGERY  2010  . AV FISTULA PLACEMENT Right 04/15/2016   Procedure: ARTERIOVENOUS (AV) FISTULA CREATION ( RADIOCEPHALIC ) STAGE 2;  Surgeon: Algernon Huxley, MD;   Location: [facility] ORS;  Service: Vascular;  Laterality: Right;  . CATARACT EXTRACTION  2006, 2011  . CHOLECYSTECTOMY  01/2008  . EYE SURGERY     cataracts bilateral  . FEMUR IM NAIL Left 12/15/2015   Procedure: INTRAMEDULLARY (IM) RETROGRADE FEMORAL NAILING;  Surgeon: Oletta Cohn, DO;  Location: [facility] ORS;  Service: Orthopedics;  Laterality: Left;  . HIP PINNING,CANNULATED Left 05/10/2016   Procedure: CANNULATED HIP PINNING;  Surgeon: Earnestine Leys, MD;  Location: [facility] ORS;  Service: Orthopedics;  Laterality: Left;  . INSERTION OF DIALYSIS CATHETER  07/2010  . JOINT REPLACEMENT     left knee  . MINOR REMOVAL OF PERITONEAL DIALYSIS CATHETER  04/15/2016   Procedure: MINOR REMOVAL OF PERITONEAL DIALYSIS CATHETER;  Surgeon: Algernon Huxley, MD;  Location: [facility] ORS;  Service: Vascular;;  . PACEMAKER INSERTION  2006  . PERIPHERAL VASCULAR CATHETERIZATION N/A 12/18/2015   Procedure: Dialysis/Perma Catheter Insertion;  Surgeon: Katha Cabal, MD;  Location: [place] [facility];  Service: Cardiovascular;  Laterality: N/A;  . TOTAL KNEE ARTHROPLASTY  2008   LEFT/Dr Calif    Prior to Admission medications   Medication Sig Start Date End Date Taking? Authorizing Provider  acetaminophen (TYLENOL) 325 MG tablet Take 2 tablets (650 mg total) by mouth every 4 (four) hours as needed for mild pain or moderate pain. 05/14/16   Loletha Grayer, MD  aspirin EC 325 MG EC tablet Take 1 tablet (325 mg total) by mouth daily with breakfast. 05/14/16   Loletha Grayer, MD  calcitRIOL (ROCALTROL) 0.25 MCG capsule Take 0.25 mcg by mouth daily.    [provider]  diphenoxylate-atropine (LOMOTIL) 2.5-0.025 MG tablet TAKE 1 TABLET BY MOUTH FOUR TIMES DAILY AS NEEDED FOR DIARRHEA OR LOOSE STOOLS Patient taking differently: TAKE 1 TABLET BY MOUTH every 8 hours AS NEEDED FOR DIARRHEA OR LOOSE STOOLS 08/30/15   [name], MD  folic acid-vitamin b complex-vitamin c-selenium-zinc (DIALYVITE) 3 MG TABS tablet Take 1  tablet by mouth daily. 03/31/16   [name], MD  guaifenesin (ROBITUSSIN) 100 MG/5ML syrup Take 200 mg by mouth every 4 (four) hours as needed for cough.    [provider]  levothyroxine (SYNTHROID, LEVOTHROID) 25 MCG tablet Take 1 tablet (25 mcg total) by mouth daily. 10/25/15   [name], MD  lidocaine (LIDODERM) 5 % Place 1 patch onto the skin every 12 (twelve) hours. Remove & Discard patch within 12 hours or as directed by MD 04/28/16 04/28/17  Merlyn Lot, MD  metoCLOPramide (REGLAN) 5 MG tablet Take 1 tablet (5 mg total) by mouth every 8 (eight) hours as needed (vertigo). 05/30/16   Lundon Verdejo, Dannielle Karvonen, PA-C  metoprolol succinate (TOPROL XL) 25 MG 24 hr tablet TAKE 1/2 BY MOUTH DAILY 01/30/16   [name], MD  multivitamin-iron-minerals-folic acid (THERAPEUTIC-M) TABS tablet Take 1 tablet by mouth daily.    [provider]  potassium chloride (K-DUR,KLOR-CON) 10 MEQ tablet Take 10 mEq by mouth once.    [provider]  sertraline (ZOLOFT) 50  MG tablet TAKE 3 TABLETS(150 MG) BY MOUTH DAILY 08/30/15   [name], MD  traMADol (ULTRAM) 50 MG tablet Take 1 tablet (50 mg total) by mouth every 12 (twelve) hours as needed for severe pain. 05/14/16   Loletha Grayer, MD  Vitamin D, Ergocalciferol, (DRISDOL) 50000 units CAPS capsule Take 50,000 Units by mouth every 7 (seven) days.  04/17/15   [provider]    Allergies Statins; Effexor [venlafaxine]; and Codeine  Family History  Problem Relation Age of Onset  . Cancer Mother     ? ovarian - sarcoma  . Cancer Father     Skin cancer  . Diabetes Sister   . COPD Sister   . Depression Sister   . Cancer Sister     Lung - 62 yrs old    Social History Social History  Substance Use Topics  . Smoking status: Former Smoker    Quit date: 03/31/1948  . Smokeless tobacco: Never Used  . Alcohol use No    Review of Systems  Constitutional: Negative for fever. Eyes: Negative for  visual changes. ENT: Negative for sore throat. Cardiovascular: Negative for chest pain. Respiratory: Negative for shortness of breath. Musculoskeletal: Negative for back pain. Skin: Negative for rash. Skin tear laceration to the left dorsal forearm. Neurological: Negative for headaches, focal weakness or numbness. ____________________________________________  PHYSICAL EXAM:  VITAL SIGNS: ED Triage Vitals  Enc Vitals Group     BP 05/30/16 1501 (!) 125/55     Pulse Rate 05/30/16 1501 61     Resp 05/30/16 1501 18     Temp 05/30/16 1501 98.1 F (36.7 C)     Temp Source 05/30/16 1501 Oral     SpO2 05/30/16 1501 98 %     Weight 05/30/16 1501 98 lb (44.5 kg)     Height 05/30/16 1501 5\' 4"  (1.626 m)     Head Circumference --      Peak Flow --      Pain Score 05/30/16 1500 10     Pain Loc --      Pain Edu? --      Excl. in [place]? --    Constitutional: Alert and oriented. Well appearing and in no distress. Head: Normocephalic and atraumatic. Resolving ecchymosis to the right temple. No acute abrasion, edema, or hematoma.  Eyes: Conjunctivae are normal. PERRL. Normal extraocular movements Ears: Canals clear. TMs intact bilaterally. Nose: No congestion/rhinorrhea/epistaxis. Neck: Supple. No thyromegaly. Cardiovascular: Normal rate, regular rhythm. Normal distal pulses. Palpable thrills over the patient's radial and subclavian AV fistulas. Respiratory: Normal respiratory effort. No wheezes/rales/rhonchi. Musculoskeletal: Nontender with normal range of motion in all extremities.  Neurologic:  Normal gait without ataxia. Normal speech and language. No gross focal neurologic deficits are appreciated. Skin:  Skin is warm, dry and intact. No rash noted.Left forearm with a 3 cm x 1 cm superficial skin tear, with small amount of active bleeding. The skin tear overlies a small superficial hematoma to the forearm. The patient's left upper extremity has ecchymosis from the volar forearm to the wrist.  She has some edema distally over the wrist, that appears to be due to compression from previously placed dressing. Psychiatric: Mood and affect are normal. Patient exhibits appropriate insight and judgment. ____________________________________________  PROCEDURES  Metoclopramide 5 mg PO PO intake without nausea/vomiting: Sprite + crackers  RADIOLOGY  Right Ribs  IMPRESSION: 1. No displaced rib fracture seen. 2. No acute cardiopulmonary process identified. ____________________________________________  INITIAL IMPRESSION / ASSESSMENT AND PLAN /  ED COURSE  Geriatric patient in ER visit related to wound care from a fall last night. Patient's exam is otherwise benign she has wounds to the forearm that are superficial skin tears and are cleansed and dressed appropriately in the ED. There is no dysfunction of the patient's right Wrist AV fistula. The skin tear in that area it start a scabbed and no infection is appreciated. She is discharged at this time with a prescription for metoclopramide to dose as needed for vertigo. She should follow-up with primary care provider, otolaryngologist, or nephrologist for ongoing management of her vertigo. Patient is discharged with wound care supplies for the next 2 days.  ----------------------------------------- 1915 on 05/30/2016 ----------------------------------------- Patient is ready for discharge her Niece has presented to the ED to take the patient back to [facility]. Patient requested use the bathroom prior to discharge. I wheeled the patient to the hallway bathroom, And assisted her in transitioning from the wheelchair to standing position with only toe-touch weight-bearing to the left hip, prior to walking out of the bathroom door. A few seconds later the patient apparently fell, and called out from the bathroom. I was just outside of the door, when I entered the bathroom. She was found lying in the right lateral decubitus position. She was  alert and oriented and active throughout the encounter. She denies any pain, serious head injury, or injury to her left hip arthroplasty. The patient was concerned about her right subclavian AV fistula. I along with her niece convinced her to allow me to x-ray her ribs and chest. She was stable from the time of x-ray to negative report, to subsequent discharge. She will follow-up with her providers for ongoing medical management.  Return precautions are reviewed with her and her niece. I again offered my apology for the patient falling in the bathroom. The niece hugged me and the patient thanked me for my care and concern, prior to discharge.  ____________________________________________  FINAL CLINICAL IMPRESSION(S) / ED DIAGNOSES  Final diagnoses:  Fall, initial encounter  Skin tear of forearm without complication, left, initial encounter  Vertigo     Carmie End, Dannielle Karvonen, PA-C 05/30/16 1818    Melvenia Needles, PA-C 05/31/16 0059    Orbie Pyo, MD 05/31/16 [phone]

## 2016-06-01 DIAGNOSIS — Z992 Dependence on renal dialysis: Secondary | ICD-10-CM | POA: Diagnosis not present

## 2016-06-01 DIAGNOSIS — D509 Iron deficiency anemia, unspecified: Secondary | ICD-10-CM | POA: Diagnosis not present

## 2016-06-01 DIAGNOSIS — D631 Anemia in chronic kidney disease: Secondary | ICD-10-CM | POA: Diagnosis not present

## 2016-06-01 DIAGNOSIS — N186 End stage renal disease: Secondary | ICD-10-CM | POA: Diagnosis not present

## 2016-06-03 DIAGNOSIS — Z992 Dependence on renal dialysis: Secondary | ICD-10-CM | POA: Diagnosis not present

## 2016-06-03 DIAGNOSIS — D509 Iron deficiency anemia, unspecified: Secondary | ICD-10-CM | POA: Diagnosis not present

## 2016-06-03 DIAGNOSIS — N186 End stage renal disease: Secondary | ICD-10-CM | POA: Diagnosis not present

## 2016-06-03 DIAGNOSIS — D631 Anemia in chronic kidney disease: Secondary | ICD-10-CM | POA: Diagnosis not present

## 2016-06-04 ENCOUNTER — Telehealth: Payer: Self-pay | Admitting: Family Medicine

## 2016-06-04 NOTE — Telephone Encounter (Signed)
Pt is at [facility] right now and does not need AWV at this time.

## 2016-06-05 DIAGNOSIS — N186 End stage renal disease: Secondary | ICD-10-CM | POA: Diagnosis not present

## 2016-06-05 DIAGNOSIS — D509 Iron deficiency anemia, unspecified: Secondary | ICD-10-CM | POA: Diagnosis not present

## 2016-06-05 DIAGNOSIS — Z992 Dependence on renal dialysis: Secondary | ICD-10-CM | POA: Diagnosis not present

## 2016-06-05 DIAGNOSIS — D631 Anemia in chronic kidney disease: Secondary | ICD-10-CM | POA: Diagnosis not present

## 2016-06-08 ENCOUNTER — Encounter (INDEPENDENT_AMBULATORY_CARE_PROVIDER_SITE_OTHER): Payer: Medicare Other | Admitting: Vascular Surgery

## 2016-06-08 ENCOUNTER — Encounter (INDEPENDENT_AMBULATORY_CARE_PROVIDER_SITE_OTHER): Payer: Medicare Other

## 2016-06-08 DIAGNOSIS — N186 End stage renal disease: Secondary | ICD-10-CM | POA: Diagnosis not present

## 2016-06-08 DIAGNOSIS — D509 Iron deficiency anemia, unspecified: Secondary | ICD-10-CM | POA: Diagnosis not present

## 2016-06-08 DIAGNOSIS — Z992 Dependence on renal dialysis: Secondary | ICD-10-CM | POA: Diagnosis not present

## 2016-06-08 DIAGNOSIS — D631 Anemia in chronic kidney disease: Secondary | ICD-10-CM | POA: Diagnosis not present

## 2016-06-09 DIAGNOSIS — E782 Mixed hyperlipidemia: Secondary | ICD-10-CM | POA: Diagnosis not present

## 2016-06-09 DIAGNOSIS — I5022 Chronic systolic (congestive) heart failure: Secondary | ICD-10-CM | POA: Diagnosis not present

## 2016-06-09 DIAGNOSIS — I10 Essential (primary) hypertension: Secondary | ICD-10-CM | POA: Diagnosis not present

## 2016-06-09 DIAGNOSIS — I495 Sick sinus syndrome: Secondary | ICD-10-CM | POA: Diagnosis not present

## 2016-06-09 DIAGNOSIS — I2510 Atherosclerotic heart disease of native coronary artery without angina pectoris: Secondary | ICD-10-CM | POA: Diagnosis not present

## 2016-06-10 DIAGNOSIS — D631 Anemia in chronic kidney disease: Secondary | ICD-10-CM | POA: Diagnosis not present

## 2016-06-10 DIAGNOSIS — D509 Iron deficiency anemia, unspecified: Secondary | ICD-10-CM | POA: Diagnosis not present

## 2016-06-10 DIAGNOSIS — N186 End stage renal disease: Secondary | ICD-10-CM | POA: Diagnosis not present

## 2016-06-10 DIAGNOSIS — Z992 Dependence on renal dialysis: Secondary | ICD-10-CM | POA: Diagnosis not present

## 2016-06-11 DIAGNOSIS — R42 Dizziness and giddiness: Secondary | ICD-10-CM | POA: Diagnosis not present

## 2016-06-12 DIAGNOSIS — Z992 Dependence on renal dialysis: Secondary | ICD-10-CM | POA: Diagnosis not present

## 2016-06-12 DIAGNOSIS — N186 End stage renal disease: Secondary | ICD-10-CM | POA: Diagnosis not present

## 2016-06-12 DIAGNOSIS — D631 Anemia in chronic kidney disease: Secondary | ICD-10-CM | POA: Diagnosis not present

## 2016-06-12 DIAGNOSIS — D509 Iron deficiency anemia, unspecified: Secondary | ICD-10-CM | POA: Diagnosis not present

## 2016-06-15 DIAGNOSIS — Z992 Dependence on renal dialysis: Secondary | ICD-10-CM | POA: Diagnosis not present

## 2016-06-15 DIAGNOSIS — D509 Iron deficiency anemia, unspecified: Secondary | ICD-10-CM | POA: Diagnosis not present

## 2016-06-15 DIAGNOSIS — D631 Anemia in chronic kidney disease: Secondary | ICD-10-CM | POA: Diagnosis not present

## 2016-06-15 DIAGNOSIS — N186 End stage renal disease: Secondary | ICD-10-CM | POA: Diagnosis not present

## 2016-06-16 DIAGNOSIS — S72032D Displaced midcervical fracture of left femur, subsequent encounter for closed fracture with routine healing: Secondary | ICD-10-CM | POA: Diagnosis not present

## 2016-06-16 DIAGNOSIS — S62325D Displaced fracture of shaft of fourth metacarpal bone, left hand, subsequent encounter for fracture with routine healing: Secondary | ICD-10-CM | POA: Diagnosis not present

## 2016-06-17 DIAGNOSIS — N186 End stage renal disease: Secondary | ICD-10-CM | POA: Diagnosis not present

## 2016-06-17 DIAGNOSIS — F329 Major depressive disorder, single episode, unspecified: Secondary | ICD-10-CM | POA: Diagnosis not present

## 2016-06-17 DIAGNOSIS — Z992 Dependence on renal dialysis: Secondary | ICD-10-CM | POA: Diagnosis not present

## 2016-06-17 DIAGNOSIS — I509 Heart failure, unspecified: Secondary | ICD-10-CM | POA: Diagnosis not present

## 2016-06-17 DIAGNOSIS — D631 Anemia in chronic kidney disease: Secondary | ICD-10-CM | POA: Diagnosis not present

## 2016-06-17 DIAGNOSIS — D509 Iron deficiency anemia, unspecified: Secondary | ICD-10-CM | POA: Diagnosis not present

## 2016-06-17 DIAGNOSIS — R195 Other fecal abnormalities: Secondary | ICD-10-CM | POA: Diagnosis not present

## 2016-06-17 DIAGNOSIS — S7290XA Unspecified fracture of unspecified femur, initial encounter for closed fracture: Secondary | ICD-10-CM | POA: Diagnosis not present

## 2016-06-19 DIAGNOSIS — Z992 Dependence on renal dialysis: Secondary | ICD-10-CM | POA: Diagnosis not present

## 2016-06-19 DIAGNOSIS — N186 End stage renal disease: Secondary | ICD-10-CM | POA: Diagnosis not present

## 2016-06-19 DIAGNOSIS — D631 Anemia in chronic kidney disease: Secondary | ICD-10-CM | POA: Diagnosis not present

## 2016-06-19 DIAGNOSIS — D509 Iron deficiency anemia, unspecified: Secondary | ICD-10-CM | POA: Diagnosis not present

## 2016-06-22 DIAGNOSIS — Z992 Dependence on renal dialysis: Secondary | ICD-10-CM | POA: Diagnosis not present

## 2016-06-22 DIAGNOSIS — N186 End stage renal disease: Secondary | ICD-10-CM | POA: Diagnosis not present

## 2016-06-22 DIAGNOSIS — D631 Anemia in chronic kidney disease: Secondary | ICD-10-CM | POA: Diagnosis not present

## 2016-06-22 DIAGNOSIS — D509 Iron deficiency anemia, unspecified: Secondary | ICD-10-CM | POA: Diagnosis not present

## 2016-06-24 DIAGNOSIS — D631 Anemia in chronic kidney disease: Secondary | ICD-10-CM | POA: Diagnosis not present

## 2016-06-24 DIAGNOSIS — Z992 Dependence on renal dialysis: Secondary | ICD-10-CM | POA: Diagnosis not present

## 2016-06-24 DIAGNOSIS — N186 End stage renal disease: Secondary | ICD-10-CM | POA: Diagnosis not present

## 2016-06-24 DIAGNOSIS — D509 Iron deficiency anemia, unspecified: Secondary | ICD-10-CM | POA: Diagnosis not present

## 2016-06-25 DIAGNOSIS — Z992 Dependence on renal dialysis: Secondary | ICD-10-CM | POA: Diagnosis not present

## 2016-06-25 DIAGNOSIS — N186 End stage renal disease: Secondary | ICD-10-CM | POA: Diagnosis not present

## 2016-06-26 DIAGNOSIS — D509 Iron deficiency anemia, unspecified: Secondary | ICD-10-CM | POA: Diagnosis not present

## 2016-06-26 DIAGNOSIS — N186 End stage renal disease: Secondary | ICD-10-CM | POA: Diagnosis not present

## 2016-06-26 DIAGNOSIS — Z992 Dependence on renal dialysis: Secondary | ICD-10-CM | POA: Diagnosis not present

## 2016-06-26 DIAGNOSIS — D631 Anemia in chronic kidney disease: Secondary | ICD-10-CM | POA: Diagnosis not present

## 2016-06-29 DIAGNOSIS — Z992 Dependence on renal dialysis: Secondary | ICD-10-CM | POA: Diagnosis not present

## 2016-06-29 DIAGNOSIS — N186 End stage renal disease: Secondary | ICD-10-CM | POA: Diagnosis not present

## 2016-06-29 DIAGNOSIS — D509 Iron deficiency anemia, unspecified: Secondary | ICD-10-CM | POA: Diagnosis not present

## 2016-06-29 DIAGNOSIS — D631 Anemia in chronic kidney disease: Secondary | ICD-10-CM | POA: Diagnosis not present

## 2016-06-30 ENCOUNTER — Ambulatory Visit (INDEPENDENT_AMBULATORY_CARE_PROVIDER_SITE_OTHER): Payer: Medicare Other

## 2016-06-30 ENCOUNTER — Ambulatory Visit (INDEPENDENT_AMBULATORY_CARE_PROVIDER_SITE_OTHER): Payer: Medicare Other | Admitting: Vascular Surgery

## 2016-06-30 ENCOUNTER — Other Ambulatory Visit (INDEPENDENT_AMBULATORY_CARE_PROVIDER_SITE_OTHER): Payer: Self-pay | Admitting: Vascular Surgery

## 2016-06-30 DIAGNOSIS — N186 End stage renal disease: Secondary | ICD-10-CM | POA: Diagnosis not present

## 2016-06-30 DIAGNOSIS — I10 Essential (primary) hypertension: Secondary | ICD-10-CM

## 2016-06-30 DIAGNOSIS — R42 Dizziness and giddiness: Secondary | ICD-10-CM | POA: Diagnosis not present

## 2016-06-30 DIAGNOSIS — Z48812 Encounter for surgical aftercare following surgery on the circulatory system: Secondary | ICD-10-CM

## 2016-06-30 DIAGNOSIS — Z992 Dependence on renal dialysis: Secondary | ICD-10-CM

## 2016-06-30 NOTE — Progress Notes (Signed)
Patient ID: Sharon [name], female   DOB: 1926-09-23, 83 y.o.   MRN: [id number]  Chief Complaint  Patient presents with  . Routine Post Op    6 week    HPI Sharon [name] is a 83 y.o. female.  Patient returns in follow-up of her AV fistula. This was created about 10 weeks ago and have been trying to use it for dialysis for the last couple of weeks. This is been somewhat difficult and she has a fair bit of bruising on her arm currently. She is still using her catheter intermittently. Her duplex today shows a widely patent right radiocephalic AV fistula without significant stenosis.   Past Medical History:  Diagnosis Date  . Arthritis   . CAD (coronary artery disease)   . Cancer ([place])    skin  . Cardiomyopathy ([place])   . Depression   . IBS (irritable bowel syndrome) 2010  . Kidney failure July 2012   Hemodialysis 3xweek  . Kidney failure   . Meniere disease   . Meniere's disease   . Myocardial infarction ([place])   . OSA (obstructive sleep apnea)    CPAP  . Peritoneal dialysis status ([place])   . Presence of permanent cardiac pacemaker     Past Surgical History:  Procedure Laterality Date  . [place]  . ANUS SURGERY  2010  . AV FISTULA PLACEMENT Right 04/15/2016   Procedure: ARTERIOVENOUS (AV) FISTULA CREATION ( RADIOCEPHALIC ) STAGE 2;  Surgeon: Algernon Huxley, MD;  Location: [facility];  Service: Vascular;  Laterality: Right;  . CATARACT EXTRACTION  2006, 2011  . CHOLECYSTECTOMY  01/2008  . EYE SURGERY     cataracts bilateral  . FEMUR IM NAIL Left 12/15/2015   Procedure: INTRAMEDULLARY (IM) RETROGRADE FEMORAL NAILING;  Surgeon: Oletta Cohn, DO;  Location: [facility];  Service: Orthopedics;  Laterality: Left;  . HIP PINNING,CANNULATED Left 05/10/2016   Procedure: CANNULATED HIP PINNING;  Surgeon: Earnestine Leys, MD;  Location: [facility];  Service: Orthopedics;  Laterality: Left;  . INSERTION OF DIALYSIS CATHETER  07/2010  . JOINT REPLACEMENT     left knee    . MINOR REMOVAL OF PERITONEAL DIALYSIS CATHETER  04/15/2016   Procedure: MINOR REMOVAL OF PERITONEAL DIALYSIS CATHETER;  Surgeon: Algernon Huxley, MD;  Location: [facility];  Service: Vascular;;  . PACEMAKER INSERTION  2006  . PERIPHERAL VASCULAR CATHETERIZATION N/A 12/18/2015   Procedure: Dialysis/Perma Catheter Insertion;  Surgeon: Katha Cabal, MD;  Location: [facility];  Service: Cardiovascular;  Laterality: N/A;  . TOTAL KNEE ARTHROPLASTY  2008   LEFT/Dr Calif      Allergies  Allergen Reactions  . Statins Other (See Comments)    Muscle weakness severe  . Effexor [Venlafaxine]   . Codeine Other (See Comments)    GI UPSET    Current Outpatient Prescriptions  Medication Sig Dispense Refill  . acetaminophen (TYLENOL) 325 MG tablet Take 2 tablets (650 mg total) by mouth every 4 (four) hours as needed for mild pain or moderate pain.    Marland Kitchen aspirin EC 325 MG EC tablet Take 1 tablet (325 mg total) by mouth daily with breakfast. 30 tablet 0  . calcitRIOL (ROCALTROL) 0.25 MCG capsule Take 0.25 mcg by mouth daily.    . diphenoxylate-atropine (LOMOTIL) 2.5-0.025 MG tablet TAKE 1 TABLET BY MOUTH FOUR TIMES DAILY AS NEEDED FOR DIARRHEA OR LOOSE STOOLS (Patient taking differently: TAKE 1 TABLET BY MOUTH every 8 hours AS  NEEDED FOR DIARRHEA OR LOOSE STOOLS) 30 tablet 5  . folic acid-vitamin b complex-vitamin c-selenium-zinc (DIALYVITE) 3 MG TABS tablet Take 1 tablet by mouth daily. 30 tablet 5  . guaifenesin (ROBITUSSIN) 100 MG/5ML syrup Take 200 mg by mouth every 4 (four) hours as needed for cough.    . levothyroxine (SYNTHROID, LEVOTHROID) 25 MCG tablet Take 1 tablet (25 mcg total) by mouth daily. 90 tablet 1  . lidocaine (LIDODERM) 5 % Place 1 patch onto the skin every 12 (twelve) hours. Remove & Discard patch within 12 hours or as directed by MD 10 patch 0  . metoCLOPramide (REGLAN) 5 MG tablet Take 1 tablet (5 mg total) by mouth every 8 (eight) hours as needed (vertigo). 15 tablet 0   . metoprolol succinate (TOPROL XL) 25 MG 24 hr tablet TAKE 1/2 BY MOUTH DAILY 30 tablet 11  . potassium chloride (K-DUR,KLOR-CON) 10 MEQ tablet Take 10 mEq by mouth once.    . sertraline (ZOLOFT) 50 MG tablet TAKE 3 TABLETS(150 MG) BY MOUTH DAILY 90 tablet 2  . traMADol (ULTRAM) 50 MG tablet Take 1 tablet (50 mg total) by mouth every 12 (twelve) hours as needed for severe pain. 10 tablet 0  . Vitamin D, Ergocalciferol, (DRISDOL) 50000 units CAPS capsule Take 50,000 Units by mouth every 7 (seven) days.     Marland Kitchen acetaminophen (TYLENOL) 325 MG tablet Take by mouth.    . folic acid-vitamin b complex-vitamin c-selenium-zinc (DIALYVITE) 3 MG TABS tablet Dialyvite 100 mg-1 mg tablet    . multivitamin-iron-minerals-folic acid (THERAPEUTIC-M) TABS tablet Take 1 tablet by mouth daily.    . sertraline (ZOLOFT) 50 MG tablet sertraline 50 mg tablet     No current facility-administered medications for this visit.         Physical Exam There were no vitals taken for this visit. Gen:  WD/WN, NAD Skin: incision C/D/I Right arm AV fistula has a good thrill     Assessment/Plan:  Hypertension blood pressure control important in reducing the progression of atherosclerotic disease. On appropriate oral medications.   ESRD on dialysis [facility]) Her duplex today shows a widely patent right radiocephalic AV fistula without significant stenosis. As her access is used more, it should become easier to stick. Once this is being used 100% of the time, her catheter should be removed. I will plan to recheck it in 6 months.      Leotis Pain 06/30/2016, 1:18 PM   This note was created with Dragon medical transcription system.  Any errors from dictation are unintentional.

## 2016-06-30 NOTE — Assessment & Plan Note (Signed)
Her duplex today shows a widely patent right radiocephalic AV fistula without significant stenosis. As her access is used more, it should become easier to stick. Once this is being used 100% of the time, her catheter should be removed. I will plan to recheck it in 6 months.

## 2016-06-30 NOTE — Assessment & Plan Note (Signed)
blood pressure control important in reducing the progression of atherosclerotic disease. On appropriate oral medications.  

## 2016-07-01 DIAGNOSIS — N186 End stage renal disease: Secondary | ICD-10-CM | POA: Diagnosis not present

## 2016-07-01 DIAGNOSIS — D631 Anemia in chronic kidney disease: Secondary | ICD-10-CM | POA: Diagnosis not present

## 2016-07-01 DIAGNOSIS — Z992 Dependence on renal dialysis: Secondary | ICD-10-CM | POA: Diagnosis not present

## 2016-07-01 DIAGNOSIS — D509 Iron deficiency anemia, unspecified: Secondary | ICD-10-CM | POA: Diagnosis not present

## 2016-07-03 DIAGNOSIS — D631 Anemia in chronic kidney disease: Secondary | ICD-10-CM | POA: Diagnosis not present

## 2016-07-03 DIAGNOSIS — Z992 Dependence on renal dialysis: Secondary | ICD-10-CM | POA: Diagnosis not present

## 2016-07-03 DIAGNOSIS — D509 Iron deficiency anemia, unspecified: Secondary | ICD-10-CM | POA: Diagnosis not present

## 2016-07-03 DIAGNOSIS — N186 End stage renal disease: Secondary | ICD-10-CM | POA: Diagnosis not present

## 2016-07-06 DIAGNOSIS — Z992 Dependence on renal dialysis: Secondary | ICD-10-CM | POA: Diagnosis not present

## 2016-07-06 DIAGNOSIS — D631 Anemia in chronic kidney disease: Secondary | ICD-10-CM | POA: Diagnosis not present

## 2016-07-06 DIAGNOSIS — D509 Iron deficiency anemia, unspecified: Secondary | ICD-10-CM | POA: Diagnosis not present

## 2016-07-06 DIAGNOSIS — N186 End stage renal disease: Secondary | ICD-10-CM | POA: Diagnosis not present

## 2016-07-07 DIAGNOSIS — F339 Major depressive disorder, recurrent, unspecified: Secondary | ICD-10-CM | POA: Diagnosis not present

## 2016-07-07 DIAGNOSIS — G3184 Mild cognitive impairment, so stated: Secondary | ICD-10-CM | POA: Diagnosis not present

## 2016-07-08 DIAGNOSIS — D631 Anemia in chronic kidney disease: Secondary | ICD-10-CM | POA: Diagnosis not present

## 2016-07-08 DIAGNOSIS — N186 End stage renal disease: Secondary | ICD-10-CM | POA: Diagnosis not present

## 2016-07-08 DIAGNOSIS — Z992 Dependence on renal dialysis: Secondary | ICD-10-CM | POA: Diagnosis not present

## 2016-07-08 DIAGNOSIS — D509 Iron deficiency anemia, unspecified: Secondary | ICD-10-CM | POA: Diagnosis not present

## 2016-07-09 DIAGNOSIS — I10 Essential (primary) hypertension: Secondary | ICD-10-CM | POA: Diagnosis not present

## 2016-07-09 DIAGNOSIS — I2510 Atherosclerotic heart disease of native coronary artery without angina pectoris: Secondary | ICD-10-CM | POA: Diagnosis not present

## 2016-07-09 DIAGNOSIS — I5022 Chronic systolic (congestive) heart failure: Secondary | ICD-10-CM | POA: Diagnosis not present

## 2016-07-09 DIAGNOSIS — I495 Sick sinus syndrome: Secondary | ICD-10-CM | POA: Diagnosis not present

## 2016-07-10 DIAGNOSIS — R52 Pain, unspecified: Secondary | ICD-10-CM | POA: Diagnosis not present

## 2016-07-10 DIAGNOSIS — I509 Heart failure, unspecified: Secondary | ICD-10-CM | POA: Diagnosis not present

## 2016-07-10 DIAGNOSIS — D631 Anemia in chronic kidney disease: Secondary | ICD-10-CM | POA: Diagnosis not present

## 2016-07-10 DIAGNOSIS — E039 Hypothyroidism, unspecified: Secondary | ICD-10-CM | POA: Diagnosis not present

## 2016-07-10 DIAGNOSIS — D509 Iron deficiency anemia, unspecified: Secondary | ICD-10-CM | POA: Diagnosis not present

## 2016-07-10 DIAGNOSIS — F329 Major depressive disorder, single episode, unspecified: Secondary | ICD-10-CM | POA: Diagnosis not present

## 2016-07-10 DIAGNOSIS — Z992 Dependence on renal dialysis: Secondary | ICD-10-CM | POA: Diagnosis not present

## 2016-07-10 DIAGNOSIS — R42 Dizziness and giddiness: Secondary | ICD-10-CM | POA: Diagnosis not present

## 2016-07-10 DIAGNOSIS — N186 End stage renal disease: Secondary | ICD-10-CM | POA: Diagnosis not present

## 2016-07-13 DIAGNOSIS — Z992 Dependence on renal dialysis: Secondary | ICD-10-CM | POA: Diagnosis not present

## 2016-07-13 DIAGNOSIS — D631 Anemia in chronic kidney disease: Secondary | ICD-10-CM | POA: Diagnosis not present

## 2016-07-13 DIAGNOSIS — D509 Iron deficiency anemia, unspecified: Secondary | ICD-10-CM | POA: Diagnosis not present

## 2016-07-13 DIAGNOSIS — N186 End stage renal disease: Secondary | ICD-10-CM | POA: Diagnosis not present

## 2016-07-14 DIAGNOSIS — S62325A Displaced fracture of shaft of fourth metacarpal bone, left hand, initial encounter for closed fracture: Secondary | ICD-10-CM | POA: Diagnosis not present

## 2016-07-14 DIAGNOSIS — M25562 Pain in left knee: Secondary | ICD-10-CM | POA: Diagnosis not present

## 2016-07-14 DIAGNOSIS — S72032D Displaced midcervical fracture of left femur, subsequent encounter for closed fracture with routine healing: Secondary | ICD-10-CM | POA: Diagnosis not present

## 2016-07-14 DIAGNOSIS — S72032A Displaced midcervical fracture of left femur, initial encounter for closed fracture: Secondary | ICD-10-CM | POA: Diagnosis not present

## 2016-07-15 DIAGNOSIS — Z992 Dependence on renal dialysis: Secondary | ICD-10-CM | POA: Diagnosis not present

## 2016-07-15 DIAGNOSIS — M6281 Muscle weakness (generalized): Secondary | ICD-10-CM | POA: Diagnosis not present

## 2016-07-15 DIAGNOSIS — N186 End stage renal disease: Secondary | ICD-10-CM | POA: Diagnosis not present

## 2016-07-15 DIAGNOSIS — D631 Anemia in chronic kidney disease: Secondary | ICD-10-CM | POA: Diagnosis not present

## 2016-07-15 DIAGNOSIS — D509 Iron deficiency anemia, unspecified: Secondary | ICD-10-CM | POA: Diagnosis not present

## 2016-07-15 DIAGNOSIS — R2689 Other abnormalities of gait and mobility: Secondary | ICD-10-CM | POA: Diagnosis not present

## 2016-07-16 DIAGNOSIS — M6281 Muscle weakness (generalized): Secondary | ICD-10-CM | POA: Diagnosis not present

## 2016-07-16 DIAGNOSIS — R2689 Other abnormalities of gait and mobility: Secondary | ICD-10-CM | POA: Diagnosis not present

## 2016-07-17 DIAGNOSIS — N186 End stage renal disease: Secondary | ICD-10-CM | POA: Diagnosis not present

## 2016-07-17 DIAGNOSIS — R2689 Other abnormalities of gait and mobility: Secondary | ICD-10-CM | POA: Diagnosis not present

## 2016-07-17 DIAGNOSIS — D509 Iron deficiency anemia, unspecified: Secondary | ICD-10-CM | POA: Diagnosis not present

## 2016-07-17 DIAGNOSIS — M6281 Muscle weakness (generalized): Secondary | ICD-10-CM | POA: Diagnosis not present

## 2016-07-17 DIAGNOSIS — Z992 Dependence on renal dialysis: Secondary | ICD-10-CM | POA: Diagnosis not present

## 2016-07-17 DIAGNOSIS — D631 Anemia in chronic kidney disease: Secondary | ICD-10-CM | POA: Diagnosis not present

## 2016-07-20 DIAGNOSIS — R2689 Other abnormalities of gait and mobility: Secondary | ICD-10-CM | POA: Diagnosis not present

## 2016-07-20 DIAGNOSIS — M6281 Muscle weakness (generalized): Secondary | ICD-10-CM | POA: Diagnosis not present

## 2016-07-20 DIAGNOSIS — Z992 Dependence on renal dialysis: Secondary | ICD-10-CM | POA: Diagnosis not present

## 2016-07-20 DIAGNOSIS — D509 Iron deficiency anemia, unspecified: Secondary | ICD-10-CM | POA: Diagnosis not present

## 2016-07-20 DIAGNOSIS — N186 End stage renal disease: Secondary | ICD-10-CM | POA: Diagnosis not present

## 2016-07-20 DIAGNOSIS — D631 Anemia in chronic kidney disease: Secondary | ICD-10-CM | POA: Diagnosis not present

## 2016-07-21 ENCOUNTER — Encounter (INDEPENDENT_AMBULATORY_CARE_PROVIDER_SITE_OTHER): Payer: Self-pay

## 2016-07-21 ENCOUNTER — Other Ambulatory Visit (INDEPENDENT_AMBULATORY_CARE_PROVIDER_SITE_OTHER): Payer: Self-pay | Admitting: Vascular Surgery

## 2016-07-21 DIAGNOSIS — M6281 Muscle weakness (generalized): Secondary | ICD-10-CM | POA: Diagnosis not present

## 2016-07-21 DIAGNOSIS — R2689 Other abnormalities of gait and mobility: Secondary | ICD-10-CM | POA: Diagnosis not present

## 2016-07-22 DIAGNOSIS — Z992 Dependence on renal dialysis: Secondary | ICD-10-CM | POA: Diagnosis not present

## 2016-07-22 DIAGNOSIS — N186 End stage renal disease: Secondary | ICD-10-CM | POA: Diagnosis not present

## 2016-07-22 DIAGNOSIS — R2689 Other abnormalities of gait and mobility: Secondary | ICD-10-CM | POA: Diagnosis not present

## 2016-07-22 DIAGNOSIS — M6281 Muscle weakness (generalized): Secondary | ICD-10-CM | POA: Diagnosis not present

## 2016-07-22 DIAGNOSIS — D509 Iron deficiency anemia, unspecified: Secondary | ICD-10-CM | POA: Diagnosis not present

## 2016-07-22 DIAGNOSIS — D631 Anemia in chronic kidney disease: Secondary | ICD-10-CM | POA: Diagnosis not present

## 2016-07-23 ENCOUNTER — Ambulatory Visit
Admission: RE | Admit: 2016-07-23 | Discharge: 2016-07-23 | Disposition: A | Discharge status: Home / Self Care | Payer: Medicare Other | Source: Ambulatory Visit | Attending: Vascular Surgery | Admitting: Vascular Surgery

## 2016-07-23 ENCOUNTER — Encounter
Admission: RE | Disposition: A | Discharge status: Home / Self Care | Payer: Self-pay | Source: Ambulatory Visit | Attending: Vascular Surgery

## 2016-07-23 DIAGNOSIS — R2689 Other abnormalities of gait and mobility: Secondary | ICD-10-CM | POA: Diagnosis not present

## 2016-07-23 DIAGNOSIS — N186 End stage renal disease: Secondary | ICD-10-CM

## 2016-07-23 DIAGNOSIS — M6281 Muscle weakness (generalized): Secondary | ICD-10-CM | POA: Diagnosis not present

## 2016-07-23 SURGERY — DIALYSIS/PERMA CATHETER REMOVAL
Anesthesia: Moderate Sedation

## 2016-07-23 NOTE — Progress Notes (Signed)
With recent infiltration, would not recommend cathter removal today.

## 2016-07-24 DIAGNOSIS — N186 End stage renal disease: Secondary | ICD-10-CM | POA: Diagnosis not present

## 2016-07-24 DIAGNOSIS — D631 Anemia in chronic kidney disease: Secondary | ICD-10-CM | POA: Diagnosis not present

## 2016-07-24 DIAGNOSIS — M6281 Muscle weakness (generalized): Secondary | ICD-10-CM | POA: Diagnosis not present

## 2016-07-24 DIAGNOSIS — Z992 Dependence on renal dialysis: Secondary | ICD-10-CM | POA: Diagnosis not present

## 2016-07-24 DIAGNOSIS — R2689 Other abnormalities of gait and mobility: Secondary | ICD-10-CM | POA: Diagnosis not present

## 2016-07-24 DIAGNOSIS — D509 Iron deficiency anemia, unspecified: Secondary | ICD-10-CM | POA: Diagnosis not present

## 2016-07-25 DIAGNOSIS — N186 End stage renal disease: Secondary | ICD-10-CM | POA: Diagnosis not present

## 2016-07-25 DIAGNOSIS — Z992 Dependence on renal dialysis: Secondary | ICD-10-CM | POA: Diagnosis not present

## 2016-07-27 DIAGNOSIS — N2581 Secondary hyperparathyroidism of renal origin: Secondary | ICD-10-CM | POA: Diagnosis not present

## 2016-07-27 DIAGNOSIS — D631 Anemia in chronic kidney disease: Secondary | ICD-10-CM | POA: Diagnosis not present

## 2016-07-27 DIAGNOSIS — M6281 Muscle weakness (generalized): Secondary | ICD-10-CM | POA: Diagnosis not present

## 2016-07-27 DIAGNOSIS — D509 Iron deficiency anemia, unspecified: Secondary | ICD-10-CM | POA: Diagnosis not present

## 2016-07-27 DIAGNOSIS — N186 End stage renal disease: Secondary | ICD-10-CM | POA: Diagnosis not present

## 2016-07-27 DIAGNOSIS — R262 Difficulty in walking, not elsewhere classified: Secondary | ICD-10-CM | POA: Diagnosis not present

## 2016-07-27 DIAGNOSIS — Z992 Dependence on renal dialysis: Secondary | ICD-10-CM | POA: Diagnosis not present

## 2016-07-28 DIAGNOSIS — M6281 Muscle weakness (generalized): Secondary | ICD-10-CM | POA: Diagnosis not present

## 2016-07-28 DIAGNOSIS — R262 Difficulty in walking, not elsewhere classified: Secondary | ICD-10-CM | POA: Diagnosis not present

## 2016-07-29 DIAGNOSIS — D631 Anemia in chronic kidney disease: Secondary | ICD-10-CM | POA: Diagnosis not present

## 2016-07-29 DIAGNOSIS — D509 Iron deficiency anemia, unspecified: Secondary | ICD-10-CM | POA: Diagnosis not present

## 2016-07-29 DIAGNOSIS — Z992 Dependence on renal dialysis: Secondary | ICD-10-CM | POA: Diagnosis not present

## 2016-07-29 DIAGNOSIS — N186 End stage renal disease: Secondary | ICD-10-CM | POA: Diagnosis not present

## 2016-07-29 DIAGNOSIS — N2581 Secondary hyperparathyroidism of renal origin: Secondary | ICD-10-CM | POA: Diagnosis not present

## 2016-07-31 DIAGNOSIS — N2581 Secondary hyperparathyroidism of renal origin: Secondary | ICD-10-CM | POA: Diagnosis not present

## 2016-07-31 DIAGNOSIS — N186 End stage renal disease: Secondary | ICD-10-CM | POA: Diagnosis not present

## 2016-07-31 DIAGNOSIS — Z992 Dependence on renal dialysis: Secondary | ICD-10-CM | POA: Diagnosis not present

## 2016-07-31 DIAGNOSIS — D509 Iron deficiency anemia, unspecified: Secondary | ICD-10-CM | POA: Diagnosis not present

## 2016-07-31 DIAGNOSIS — D631 Anemia in chronic kidney disease: Secondary | ICD-10-CM | POA: Diagnosis not present

## 2016-08-03 DIAGNOSIS — D631 Anemia in chronic kidney disease: Secondary | ICD-10-CM | POA: Diagnosis not present

## 2016-08-03 DIAGNOSIS — Z992 Dependence on renal dialysis: Secondary | ICD-10-CM | POA: Diagnosis not present

## 2016-08-03 DIAGNOSIS — D509 Iron deficiency anemia, unspecified: Secondary | ICD-10-CM | POA: Diagnosis not present

## 2016-08-03 DIAGNOSIS — N2581 Secondary hyperparathyroidism of renal origin: Secondary | ICD-10-CM | POA: Diagnosis not present

## 2016-08-03 DIAGNOSIS — N186 End stage renal disease: Secondary | ICD-10-CM | POA: Diagnosis not present

## 2016-08-05 DIAGNOSIS — D631 Anemia in chronic kidney disease: Secondary | ICD-10-CM | POA: Diagnosis not present

## 2016-08-05 DIAGNOSIS — N186 End stage renal disease: Secondary | ICD-10-CM | POA: Diagnosis not present

## 2016-08-05 DIAGNOSIS — Z992 Dependence on renal dialysis: Secondary | ICD-10-CM | POA: Diagnosis not present

## 2016-08-05 DIAGNOSIS — N2581 Secondary hyperparathyroidism of renal origin: Secondary | ICD-10-CM | POA: Diagnosis not present

## 2016-08-05 DIAGNOSIS — D509 Iron deficiency anemia, unspecified: Secondary | ICD-10-CM | POA: Diagnosis not present

## 2016-08-07 DIAGNOSIS — N186 End stage renal disease: Secondary | ICD-10-CM | POA: Diagnosis not present

## 2016-08-07 DIAGNOSIS — N2581 Secondary hyperparathyroidism of renal origin: Secondary | ICD-10-CM | POA: Diagnosis not present

## 2016-08-07 DIAGNOSIS — D509 Iron deficiency anemia, unspecified: Secondary | ICD-10-CM | POA: Diagnosis not present

## 2016-08-07 DIAGNOSIS — D631 Anemia in chronic kidney disease: Secondary | ICD-10-CM | POA: Diagnosis not present

## 2016-08-07 DIAGNOSIS — Z992 Dependence on renal dialysis: Secondary | ICD-10-CM | POA: Diagnosis not present

## 2016-08-10 DIAGNOSIS — N186 End stage renal disease: Secondary | ICD-10-CM | POA: Diagnosis not present

## 2016-08-10 DIAGNOSIS — D631 Anemia in chronic kidney disease: Secondary | ICD-10-CM | POA: Diagnosis not present

## 2016-08-10 DIAGNOSIS — D509 Iron deficiency anemia, unspecified: Secondary | ICD-10-CM | POA: Diagnosis not present

## 2016-08-10 DIAGNOSIS — N2581 Secondary hyperparathyroidism of renal origin: Secondary | ICD-10-CM | POA: Diagnosis not present

## 2016-08-10 DIAGNOSIS — Z992 Dependence on renal dialysis: Secondary | ICD-10-CM | POA: Diagnosis not present

## 2016-08-12 DIAGNOSIS — Z992 Dependence on renal dialysis: Secondary | ICD-10-CM | POA: Diagnosis not present

## 2016-08-12 DIAGNOSIS — N186 End stage renal disease: Secondary | ICD-10-CM | POA: Diagnosis not present

## 2016-08-12 DIAGNOSIS — N2581 Secondary hyperparathyroidism of renal origin: Secondary | ICD-10-CM | POA: Diagnosis not present

## 2016-08-12 DIAGNOSIS — D631 Anemia in chronic kidney disease: Secondary | ICD-10-CM | POA: Diagnosis not present

## 2016-08-12 DIAGNOSIS — D509 Iron deficiency anemia, unspecified: Secondary | ICD-10-CM | POA: Diagnosis not present

## 2016-08-14 ENCOUNTER — Other Ambulatory Visit (INDEPENDENT_AMBULATORY_CARE_PROVIDER_SITE_OTHER): Payer: Self-pay

## 2016-08-14 ENCOUNTER — Encounter (INDEPENDENT_AMBULATORY_CARE_PROVIDER_SITE_OTHER): Payer: Self-pay

## 2016-08-14 DIAGNOSIS — D631 Anemia in chronic kidney disease: Secondary | ICD-10-CM | POA: Diagnosis not present

## 2016-08-14 DIAGNOSIS — D509 Iron deficiency anemia, unspecified: Secondary | ICD-10-CM | POA: Diagnosis not present

## 2016-08-14 DIAGNOSIS — Z992 Dependence on renal dialysis: Secondary | ICD-10-CM | POA: Diagnosis not present

## 2016-08-14 DIAGNOSIS — N2581 Secondary hyperparathyroidism of renal origin: Secondary | ICD-10-CM | POA: Diagnosis not present

## 2016-08-14 DIAGNOSIS — N186 End stage renal disease: Secondary | ICD-10-CM | POA: Diagnosis not present

## 2016-08-17 DIAGNOSIS — D509 Iron deficiency anemia, unspecified: Secondary | ICD-10-CM | POA: Diagnosis not present

## 2016-08-17 DIAGNOSIS — N2581 Secondary hyperparathyroidism of renal origin: Secondary | ICD-10-CM | POA: Diagnosis not present

## 2016-08-17 DIAGNOSIS — E781 Pure hyperglyceridemia: Secondary | ICD-10-CM | POA: Diagnosis not present

## 2016-08-17 DIAGNOSIS — N186 End stage renal disease: Secondary | ICD-10-CM | POA: Diagnosis not present

## 2016-08-17 DIAGNOSIS — D631 Anemia in chronic kidney disease: Secondary | ICD-10-CM | POA: Diagnosis not present

## 2016-08-17 DIAGNOSIS — Z992 Dependence on renal dialysis: Secondary | ICD-10-CM | POA: Diagnosis not present

## 2016-08-18 ENCOUNTER — Encounter (INDEPENDENT_AMBULATORY_CARE_PROVIDER_SITE_OTHER): Payer: Self-pay

## 2016-08-18 ENCOUNTER — Encounter: Admission: RE | Payer: Self-pay | Source: Ambulatory Visit

## 2016-08-18 ENCOUNTER — Ambulatory Visit: Admission: RE | Admit: 2016-08-18 | Payer: Medicare Other | Source: Ambulatory Visit | Admitting: Vascular Surgery

## 2016-08-18 SURGERY — DIALYSIS/PERMA CATHETER REMOVAL
Anesthesia: LOCAL

## 2016-08-19 DIAGNOSIS — D631 Anemia in chronic kidney disease: Secondary | ICD-10-CM | POA: Diagnosis not present

## 2016-08-19 DIAGNOSIS — N2581 Secondary hyperparathyroidism of renal origin: Secondary | ICD-10-CM | POA: Diagnosis not present

## 2016-08-19 DIAGNOSIS — N186 End stage renal disease: Secondary | ICD-10-CM | POA: Diagnosis not present

## 2016-08-19 DIAGNOSIS — Z992 Dependence on renal dialysis: Secondary | ICD-10-CM | POA: Diagnosis not present

## 2016-08-19 DIAGNOSIS — D509 Iron deficiency anemia, unspecified: Secondary | ICD-10-CM | POA: Diagnosis not present

## 2016-08-20 ENCOUNTER — Encounter
Admission: RE | Disposition: A | Discharge status: Home / Self Care | Payer: Self-pay | Source: Ambulatory Visit | Attending: Vascular Surgery

## 2016-08-20 ENCOUNTER — Ambulatory Visit
Admission: RE | Admit: 2016-08-20 | Discharge: 2016-08-20 | Disposition: A | Discharge status: Home / Self Care | Payer: Medicare Other | Source: Ambulatory Visit | Attending: Vascular Surgery | Admitting: Vascular Surgery

## 2016-08-20 DIAGNOSIS — Z885 Allergy status to narcotic agent status: Secondary | ICD-10-CM | POA: Diagnosis not present

## 2016-08-20 DIAGNOSIS — I252 Old myocardial infarction: Secondary | ICD-10-CM | POA: Insufficient documentation

## 2016-08-20 DIAGNOSIS — Z808 Family history of malignant neoplasm of other organs or systems: Secondary | ICD-10-CM | POA: Diagnosis not present

## 2016-08-20 DIAGNOSIS — Z96652 Presence of left artificial knee joint: Secondary | ICD-10-CM | POA: Diagnosis not present

## 2016-08-20 DIAGNOSIS — Z8041 Family history of malignant neoplasm of ovary: Secondary | ICD-10-CM | POA: Insufficient documentation

## 2016-08-20 DIAGNOSIS — N186 End stage renal disease: Secondary | ICD-10-CM

## 2016-08-20 DIAGNOSIS — I120 Hypertensive chronic kidney disease with stage 5 chronic kidney disease or end stage renal disease: Secondary | ICD-10-CM | POA: Diagnosis not present

## 2016-08-20 DIAGNOSIS — Z9849 Cataract extraction status, unspecified eye: Secondary | ICD-10-CM | POA: Insufficient documentation

## 2016-08-20 DIAGNOSIS — Z9049 Acquired absence of other specified parts of digestive tract: Secondary | ICD-10-CM | POA: Diagnosis not present

## 2016-08-20 DIAGNOSIS — M1990 Unspecified osteoarthritis, unspecified site: Secondary | ICD-10-CM | POA: Diagnosis not present

## 2016-08-20 DIAGNOSIS — K589 Irritable bowel syndrome without diarrhea: Secondary | ICD-10-CM | POA: Diagnosis not present

## 2016-08-20 DIAGNOSIS — H8109 Meniere's disease, unspecified ear: Secondary | ICD-10-CM | POA: Insufficient documentation

## 2016-08-20 DIAGNOSIS — Z950 Presence of cardiac pacemaker: Secondary | ICD-10-CM | POA: Insufficient documentation

## 2016-08-20 DIAGNOSIS — I429 Cardiomyopathy, unspecified: Secondary | ICD-10-CM | POA: Insufficient documentation

## 2016-08-20 DIAGNOSIS — Z888 Allergy status to other drugs, medicaments and biological substances status: Secondary | ICD-10-CM | POA: Insufficient documentation

## 2016-08-20 DIAGNOSIS — Z836 Family history of other diseases of the respiratory system: Secondary | ICD-10-CM | POA: Insufficient documentation

## 2016-08-20 DIAGNOSIS — Z859 Personal history of malignant neoplasm, unspecified: Secondary | ICD-10-CM | POA: Insufficient documentation

## 2016-08-20 DIAGNOSIS — I2510 Atherosclerotic heart disease of native coronary artery without angina pectoris: Secondary | ICD-10-CM | POA: Insufficient documentation

## 2016-08-20 DIAGNOSIS — Z87891 Personal history of nicotine dependence: Secondary | ICD-10-CM | POA: Insufficient documentation

## 2016-08-20 DIAGNOSIS — Z801 Family history of malignant neoplasm of trachea, bronchus and lung: Secondary | ICD-10-CM | POA: Insufficient documentation

## 2016-08-20 DIAGNOSIS — Z452 Encounter for adjustment and management of vascular access device: Secondary | ICD-10-CM | POA: Insufficient documentation

## 2016-08-20 DIAGNOSIS — Z833 Family history of diabetes mellitus: Secondary | ICD-10-CM | POA: Insufficient documentation

## 2016-08-20 DIAGNOSIS — G4733 Obstructive sleep apnea (adult) (pediatric): Secondary | ICD-10-CM | POA: Diagnosis not present

## 2016-08-20 DIAGNOSIS — Z9889 Other specified postprocedural states: Secondary | ICD-10-CM | POA: Diagnosis not present

## 2016-08-20 DIAGNOSIS — Z992 Dependence on renal dialysis: Secondary | ICD-10-CM | POA: Diagnosis not present

## 2016-08-20 DIAGNOSIS — I10 Essential (primary) hypertension: Secondary | ICD-10-CM | POA: Diagnosis not present

## 2016-08-20 HISTORY — PX: DIALYSIS/PERMA CATHETER REMOVAL: CATH118289

## 2016-08-20 SURGERY — DIALYSIS/PERMA CATHETER REMOVAL
Anesthesia: Moderate Sedation

## 2016-08-20 SURGICAL SUPPLY — 1 items: TRAY LACERAT/PLASTIC (MISCELLANEOUS) ×3 IMPLANT

## 2016-08-20 NOTE — H&P (Signed)
[facility] Admission History & Physical  MRN : [id number]  Sharon [name] is a 83 y.o. (1926/11/15) female who presents with chief complaint of No chief complaint on file. Marland Kitchen  History of Present Illness: I am asked to evaluate the patient by the dialysis center. The patient was sent here because they have a nonfunctioning tunneled catheter and a functioning AVF.  The patient reports they're not been any problems with any of their dialysis runs. They are reporting good flows with good parameters at dialysis.  Patient denies pain or tenderness overlying the access.  There is no pain with dialysis.  The patient denies hand pain or finger pain consistent with steal syndrome.  No fevers or chills while on dialysis.   No current facility-administered medications for this encounter.     Past Medical History:  Diagnosis Date  . Arthritis   . CAD (coronary artery disease)   . Cancer ([place])    skin  . Cardiomyopathy ([place])   . Depression   . IBS (irritable bowel syndrome) 2010  . Kidney failure July 2012   Hemodialysis 3xweek  . Kidney failure   . Meniere disease   . Meniere's disease   . Myocardial infarction ([place])   . OSA (obstructive sleep apnea)    CPAP  . Peritoneal dialysis status ([place])   . Presence of permanent cardiac pacemaker     Past Surgical History:  Procedure Laterality Date  . [place]  . ANUS SURGERY  2010  . AV FISTULA PLACEMENT Right 04/15/2016   Procedure: ARTERIOVENOUS (AV) FISTULA CREATION ( RADIOCEPHALIC ) STAGE 2;  Surgeon: Algernon Huxley, MD;  Location: [facility];  Service: Vascular;  Laterality: Right;  . CATARACT EXTRACTION  2006, 2011  . CHOLECYSTECTOMY  01/2008  . EYE SURGERY     cataracts bilateral  . FEMUR IM NAIL Left 12/15/2015   Procedure: INTRAMEDULLARY (IM) RETROGRADE FEMORAL NAILING;  Surgeon: Oletta Cohn, DO;  Location: [facility];  Service: Orthopedics;  Laterality: Left;  . HIP PINNING,CANNULATED  Left 05/10/2016   Procedure: CANNULATED HIP PINNING;  Surgeon: Earnestine Leys, MD;  Location: [facility];  Service: Orthopedics;  Laterality: Left;  . INSERTION OF DIALYSIS CATHETER  07/2010  . JOINT REPLACEMENT     left knee  . MINOR REMOVAL OF PERITONEAL DIALYSIS CATHETER  04/15/2016   Procedure: MINOR REMOVAL OF PERITONEAL DIALYSIS CATHETER;  Surgeon: Algernon Huxley, MD;  Location: [facility];  Service: Vascular;;  . PACEMAKER INSERTION  2006  . PERIPHERAL VASCULAR CATHETERIZATION N/A 12/18/2015   Procedure: Dialysis/Perma Catheter Insertion;  Surgeon: Katha Cabal, MD;  Location: [facility];  Service: Cardiovascular;  Laterality: N/A;  . TOTAL KNEE ARTHROPLASTY  2008   LEFT/Dr Calif    Social History Social History  Substance Use Topics  . Smoking status: Former Smoker    Quit date: 03/31/1948  . Smokeless tobacco: Never Used  . Alcohol use No  No IVDU  Family History Family History  Problem Relation Age of Onset  . Cancer Mother        ? ovarian - sarcoma  . Cancer Father        Skin cancer  . Diabetes Sister   . COPD Sister   . Depression Sister   . Cancer Sister        Lung - 52 yrs old    No family history of bleeding or clotting disorders, autoimmune disease or porphyria  Allergies  Allergen Reactions  . Statins Other (See Comments)    Muscle weakness severe  . Effexor [Venlafaxine]   . Codeine Other (See Comments)    GI UPSET     REVIEW OF SYSTEMS (Negative unless checked)  Constitutional: [] Weight loss  [] Fever  [] Chills Cardiac: [] Chest pain   [] Chest pressure   [] Palpitations   [] Shortness of breath when laying flat   [] Shortness of breath at rest   [x] Shortness of breath with exertion. Vascular:  [] Pain in legs with walking   [] Pain in legs at rest   [] Pain in legs when laying flat   [] Claudication   [] Pain in feet when walking  [] Pain in feet at rest  [] Pain in feet when laying flat   [] History of DVT   [] Phlebitis   [] Swelling in legs   [] Varicose  veins   [] Non-healing ulcers Pulmonary:   [] Uses home oxygen   [] Productive cough   [] Hemoptysis   [] Wheeze  [] COPD   [] Asthma Neurologic:  [] Dizziness  [] Blackouts   [] Seizures   [] History of stroke   [] History of TIA  [] Aphasia   [] Temporary blindness   [] Dysphagia   [] Weakness or numbness in arms   [] Weakness or numbness in legs Musculoskeletal:  [x] Arthritis   [] Joint swelling   [] Joint pain   [] Low back pain Hematologic:  [] Easy bruising  [] Easy bleeding   [] Hypercoagulable state   [] Anemic  [] Hepatitis Gastrointestinal:  [] Blood in stool   [] Vomiting blood  [] Gastroesophageal reflux/heartburn   [] Difficulty swallowing. Genitourinary:  [x] Chronic kidney disease   [] Difficult urination  [] Frequent urination  [] Burning with urination   [] Blood in urine Skin:  [] Rashes   [] Ulcers   [] Wounds Psychological:  [] History of anxiety   []  History of major depression.  Physical Examination  Vitals:   08/20/16 1145  BP: (!) 144/68  Pulse: 65  Temp: 98.1 F (36.7 C)  TempSrc: Oral  Weight: 47.2 kg (104 lb)  Height: 5' 4.5" (1.638 m)   Body mass index is 17.58 kg/m. Gen: Frail, elderly WF in NAD Head: [place]/AT, + temporalis wasting.  Ear/Nose/Throat: Hearing grossly intact, nares w/o erythema or drainage, oropharynx w/o Erythema/Exudate,  Eyes: Conjunctiva clear, sclera non-icteric Neck: Trachea midline.  No JVD.  Pulmonary:  Good air movement, respirations not labored, no use of accessory muscles.  Cardiac: RRR, normal S1, S2. Vascular: thrill in AVF, Permcath exiting in subclavicular location Vessel Right Left  Radial Palpable Palpable  Ulnar Not Palpable Not Palpable  Brachial Palpable Palpable  Carotid Palpable, without bruit Palpable, without bruit   Musculoskeletal: M/S 5/5 throughout.  Extremities without ischemic changes.  No deformity or atrophy.  Neurologic: Sensation grossly intact in extremities.  Symmetrical.  Speech is fluent. Motor exam as listed above. Psychiatric:  Judgment intact, Mood & affect appropriate for pt's clinical situation. Dermatologic: No rashes or ulcers noted.  No cellulitis or open wounds.    CBC Lab Results  Component Value Date   WBC 8.0 05/14/2016   HGB 11.2 (L) 05/14/2016   HCT 34.3 (L) 05/14/2016   MCV 85.4 05/14/2016   PLT 276 05/14/2016    BMET    Component Value Date/Time   NA 136 05/14/2016 0525   NA 142 04/24/2013 1557   K 3.9 05/14/2016 0525   K 3.8 04/24/2013 1557   CL 102 05/14/2016 0525   CL 106 04/24/2013 1557   CO2 27 05/14/2016 0525   CO2 32 04/24/2013 1557   GLUCOSE 116 (H) 05/14/2016 0525   GLUCOSE 109 (H) 04/24/2013  1557   BUN 27 (H) 05/14/2016 0525   BUN 40 (A) 09/03/2014   BUN 30 (H) 04/24/2013 1557   CREATININE 1.59 (H) 05/14/2016 0525   CREATININE 2.68 (H) 04/24/2013 1557   CALCIUM 9.1 05/14/2016 0525   CALCIUM 8.7 04/24/2013 1557   GFRNONAA 28 (L) 05/14/2016 0525   GFRNONAA 16 (L) 04/24/2013 1557   GFRAA 33 (L) 05/14/2016 0525   GFRAA 18 (L) 04/24/2013 1557   CrCl cannot be calculated (Patient's most recent lab result is older than the maximum 21 days allowed.).  COAG Lab Results  Component Value Date   INR 1.08 05/10/2016   INR 1.14 04/10/2016   INR 1.03 12/14/2015    Radiology No results found.  Assessment/Plan 1.  Complication dialysis device with thrombosis AV access:  Patient's Tunneled catheter is malfunctioning. The patient has an extremity access that is functioning well. Therefore, the patient will undergo removal of the tunneled catheter under local anesthesia.  The risks and benefits were described to the patient.  All questions were answered.  The patient agrees to proceed with angiography and intervention. Potassium will be drawn to ensure that it is an appropriate level prior to performing intervention. 2.  End-stage renal disease requiring hemodialysis:  Patient will continue dialysis therapy without further interruption if a successful intervention is not achieved  then a tunneled catheter will be placed. Dialysis has already been arranged. 3.  Hypertension:  Patient will continue medical management; nephrology is following no changes in oral medications. 4.  CAD: stable. No current anginal symptoms   Leotis Pain, MD  08/20/2016 12:05 PM

## 2016-08-20 NOTE — Op Note (Signed)
Operative Note     Preoperative diagnosis:   1. ESRD with functional permanent access  Postoperative diagnosis:  1. ESRD with functional permanent access  Procedure:  Removal of Right jugular Permcath  Surgeon:  Leotis Pain, MD  Anesthesia:  Local  EBL:  Minimal  Indication for the Procedure:  The patient has a functional permanent dialysis access and no longer needs their permcath.  This can be removed.  Risks and benefits are discussed and informed consent is obtained.  Description of the Procedure:  The patient's right neck, chest and existing catheter were sterilely prepped and draped. The area around the catheter was anesthetized copiously with 1% lidocaine. The catheter was dissected out with curved hemostats until the cuff was freed from the surrounding fibrous sheath. The fiber sheath was transected, and the catheter was then removed in its entirety using gentle traction. Pressure was held and sterile dressings were placed. The patient tolerated the procedure well and was taken to the recovery room in stable condition.     Leotis Pain  08/20/2016, 12:44 PM This note was created with Dragon Medical transcription system. Any errors in dictation are purely unintentional.

## 2016-08-21 ENCOUNTER — Encounter: Payer: Self-pay | Admitting: Vascular Surgery

## 2016-08-21 DIAGNOSIS — Z992 Dependence on renal dialysis: Secondary | ICD-10-CM | POA: Diagnosis not present

## 2016-08-21 DIAGNOSIS — D631 Anemia in chronic kidney disease: Secondary | ICD-10-CM | POA: Diagnosis not present

## 2016-08-21 DIAGNOSIS — N2581 Secondary hyperparathyroidism of renal origin: Secondary | ICD-10-CM | POA: Diagnosis not present

## 2016-08-21 DIAGNOSIS — D509 Iron deficiency anemia, unspecified: Secondary | ICD-10-CM | POA: Diagnosis not present

## 2016-08-21 DIAGNOSIS — N186 End stage renal disease: Secondary | ICD-10-CM | POA: Diagnosis not present

## 2016-08-24 DIAGNOSIS — D509 Iron deficiency anemia, unspecified: Secondary | ICD-10-CM | POA: Diagnosis not present

## 2016-08-24 DIAGNOSIS — Z992 Dependence on renal dialysis: Secondary | ICD-10-CM | POA: Diagnosis not present

## 2016-08-24 DIAGNOSIS — N186 End stage renal disease: Secondary | ICD-10-CM | POA: Diagnosis not present

## 2016-08-24 DIAGNOSIS — N2581 Secondary hyperparathyroidism of renal origin: Secondary | ICD-10-CM | POA: Diagnosis not present

## 2016-08-24 DIAGNOSIS — D631 Anemia in chronic kidney disease: Secondary | ICD-10-CM | POA: Diagnosis not present

## 2016-08-25 DIAGNOSIS — N186 End stage renal disease: Secondary | ICD-10-CM | POA: Diagnosis not present

## 2016-08-25 DIAGNOSIS — M170 Bilateral primary osteoarthritis of knee: Secondary | ICD-10-CM | POA: Diagnosis not present

## 2016-08-25 DIAGNOSIS — S72032D Displaced midcervical fracture of left femur, subsequent encounter for closed fracture with routine healing: Secondary | ICD-10-CM | POA: Diagnosis not present

## 2016-08-25 DIAGNOSIS — Z992 Dependence on renal dialysis: Secondary | ICD-10-CM | POA: Diagnosis not present

## 2016-08-26 DIAGNOSIS — D509 Iron deficiency anemia, unspecified: Secondary | ICD-10-CM | POA: Diagnosis not present

## 2016-08-26 DIAGNOSIS — D631 Anemia in chronic kidney disease: Secondary | ICD-10-CM | POA: Diagnosis not present

## 2016-08-26 DIAGNOSIS — Z992 Dependence on renal dialysis: Secondary | ICD-10-CM | POA: Diagnosis not present

## 2016-08-26 DIAGNOSIS — N186 End stage renal disease: Secondary | ICD-10-CM | POA: Diagnosis not present

## 2016-08-27 ENCOUNTER — Ambulatory Visit (INDEPENDENT_AMBULATORY_CARE_PROVIDER_SITE_OTHER): Payer: Medicare Other | Admitting: Family Medicine

## 2016-08-27 ENCOUNTER — Encounter: Payer: Self-pay | Admitting: Family Medicine

## 2016-08-27 VITALS — BP 140/60 | HR 63 | Temp 97.9°F | Wt 106.4 lb

## 2016-08-27 DIAGNOSIS — R634 Abnormal weight loss: Secondary | ICD-10-CM | POA: Diagnosis not present

## 2016-08-27 DIAGNOSIS — E039 Hypothyroidism, unspecified: Secondary | ICD-10-CM | POA: Diagnosis not present

## 2016-08-27 DIAGNOSIS — H539 Unspecified visual disturbance: Secondary | ICD-10-CM | POA: Insufficient documentation

## 2016-08-27 DIAGNOSIS — I10 Essential (primary) hypertension: Secondary | ICD-10-CM | POA: Diagnosis not present

## 2016-08-27 DIAGNOSIS — W19XXXA Unspecified fall, initial encounter: Secondary | ICD-10-CM

## 2016-08-27 LAB — TSH: TSH: 4.1 u[IU]/mL (ref 0.35–4.50)

## 2016-08-27 NOTE — Assessment & Plan Note (Signed)
Weight has actually increased recently. She does report occasional night sweats and itching today. Discussed that these findings are somewhat concerning. Advised on CT scan chest abdomen and pelvis though she declined these and opted to monitor given that she has gained some weight. Plan on checking her TSH. She is most concerned about her thyroid. She'll continue Synthroid. We'll see her back in a month for a weight check.

## 2016-08-27 NOTE — Assessment & Plan Note (Signed)
Intermittent falls. Only one since getting home from rehabilitation and skilled nursing. Advised to use her walker consistently. Area of injury on left arm with skin tear. No signs of infection. Advised on soap and water to clean and keeping this addressed. If develops signs of infection she'll be evaluated. We'll have her do physical therapy at home.

## 2016-08-27 NOTE — Patient Instructions (Signed)
Nice to see you. We are going to check some lab work. We'll keep you set up for home health physical therapy. We'll get you referred to ophthalmology. If you're vision changes or he develop worsening symptoms please be evaluated.

## 2016-08-27 NOTE — Progress Notes (Signed)
[name], MD Phone: [phone]  Sharon [name] is a 83 y.o. female who presents today for follow-up.  Patient notes she fell multiple times and broke several bones in her legs. Also had a slight hip fracture. She was hospitalized on several occasions. She underwent rehabilitation and then went to nursing facility. She was doing physical therapy though now she is at home. She's following with orthopedics. They recommended doing outpatient physical therapy though she has difficulty getting out of the house. She would like to do home health physical therapy. She's fallen once since getting home. Notes only injury at that point was a skin tear on her left forearm. She has been managing this with bandages and keeping it clean.  Hypertension: Not checking at home. Taking metoprolol. No chest pain. Occasional minimal shortness of breath with exertion that is followed by cardiology. Notes this is stable. Rare ankle edema.  Weight loss: Weight has actually trended up a little bit over the last month or so per her report. She's eating anything and everything she can get her hands on. She does note occasional night sweats now. Does note some itching at times though does not itch all over and notes this is focal. She is up-to-date on colon cancer screening. She is status post hysterectomy and BSO. She's notes she does not get mammograms now.  Patient does note some issues with her vision being blurry at times. Occasionally she'll have some double vision. This has been going on since she fell 4 months ago. She had a reassuring CT scan. Left pupil remains enlarged compared to the right.  PMH: Former smoker   ROS see history of present illness  Objective  Physical Exam Vitals:   08/27/16 1131  BP: 140/60  Pulse: 63  Temp: 97.9 F (36.6 C)    BP Readings from Last 3 Encounters:  08/27/16 140/60  08/20/16 (!) 163/67  05/30/16 (!) 128/46   Wt Readings from Last 3 Encounters:  08/27/16  106 lb 6.4 oz (48.3 kg)  08/20/16 104 lb (47.2 kg)  05/30/16 98 lb (44.5 kg)    Physical Exam  Constitutional: No distress.  Cardiovascular: Normal rate, regular rhythm and normal heart sounds.   Pulmonary/Chest: Effort normal and breath sounds normal.  Musculoskeletal: She exhibits no edema.  Lymphadenopathy:       Head (right side): No submental and no submandibular adenopathy present.       Head (left side): No submental and no submandibular adenopathy present.    She has no cervical adenopathy.       Right: No supraclavicular adenopathy present.       Left: No supraclavicular adenopathy present.  Neurological: She is alert. Gait normal.  Skin: Skin is warm and dry. She is not diaphoretic.  Skin tear left dorsal forearm with no signs of infection     Assessment/Plan: Please see individual problem list.  Hypertension At goal for age. Continue current medication.  Falls Intermittent falls. Only one since getting home from rehabilitation and skilled nursing. Advised to use her walker consistently. Area of injury on left arm with skin tear. No signs of infection. Advised on soap and water to clean and keeping this addressed. If develops signs of infection she'll be evaluated. We'll have her do physical therapy at home.  Loss of weight Weight has actually increased recently. She does report occasional night sweats and itching today. Discussed that these findings are somewhat concerning. Advised on CT scan chest abdomen and pelvis though she declined  these and opted to monitor given that she has gained some weight. Plan on checking her TSH. She is most concerned about her thyroid. She'll continue Synthroid. We'll see her back in a month for a weight check.  Vision changes Reports intermittent blurred vision and intermittent double vision. Vision today normal. Left pupil remains enlarged compared to the right. She had a CT scan previously to evaluate for intracranial abnormalities that  did not reveal any acute changes. We'll refer her to ophthalmology.   Orders Placed This Encounter  Procedures  . TSH  . Ambulatory referral to Home Health    Referral Priority:   Routine    Referral Type:   Home Health Care    Referral Reason:   Specialty Services Required    Requested Specialty:   [place]    Number of Visits Requested:   1  . Ambulatory referral to Ophthalmology    Referral Priority:   Routine    Referral Type:   Consultation    Referral Reason:   Specialty Services Required    Requested Specialty:   Ophthalmology    Number of Visits Requested:   [place], [place]

## 2016-08-27 NOTE — Assessment & Plan Note (Signed)
Reports intermittent blurred vision and intermittent double vision. Vision today normal. Left pupil remains enlarged compared to the right. She had a CT scan previously to evaluate for intracranial abnormalities that did not reveal any acute changes. We'll refer her to ophthalmology.

## 2016-08-27 NOTE — Assessment & Plan Note (Signed)
At goal for age. Continue current medication. 

## 2016-08-28 ENCOUNTER — Telehealth: Payer: Self-pay | Admitting: Family Medicine

## 2016-08-28 DIAGNOSIS — N186 End stage renal disease: Secondary | ICD-10-CM | POA: Diagnosis not present

## 2016-08-28 DIAGNOSIS — D631 Anemia in chronic kidney disease: Secondary | ICD-10-CM | POA: Diagnosis not present

## 2016-08-28 DIAGNOSIS — D509 Iron deficiency anemia, unspecified: Secondary | ICD-10-CM | POA: Diagnosis not present

## 2016-08-28 DIAGNOSIS — Z992 Dependence on renal dialysis: Secondary | ICD-10-CM | POA: Diagnosis not present

## 2016-08-28 NOTE — Telephone Encounter (Signed)
That is okay with me 

## 2016-08-28 NOTE — Telephone Encounter (Signed)
Please advise 

## 2016-08-28 NOTE — Telephone Encounter (Signed)
Left vm with approved orders

## 2016-08-28 NOTE — Telephone Encounter (Signed)
Simona Huh from [facility] called and stated that pt would like to push her start date to Tuesday 8/7. Please advise, thank you!  Call [place] @ [phone]

## 2016-08-30 DIAGNOSIS — Z87891 Personal history of nicotine dependence: Secondary | ICD-10-CM | POA: Diagnosis not present

## 2016-08-30 DIAGNOSIS — I420 Dilated cardiomyopathy: Secondary | ICD-10-CM | POA: Diagnosis not present

## 2016-08-30 DIAGNOSIS — Z992 Dependence on renal dialysis: Secondary | ICD-10-CM | POA: Diagnosis not present

## 2016-08-30 DIAGNOSIS — I252 Old myocardial infarction: Secondary | ICD-10-CM | POA: Diagnosis not present

## 2016-08-30 DIAGNOSIS — Z950 Presence of cardiac pacemaker: Secondary | ICD-10-CM | POA: Diagnosis not present

## 2016-08-30 DIAGNOSIS — S51812D Laceration without foreign body of left forearm, subsequent encounter: Secondary | ICD-10-CM | POA: Diagnosis not present

## 2016-08-30 DIAGNOSIS — Z8673 Personal history of transient ischemic attack (TIA), and cerebral infarction without residual deficits: Secondary | ICD-10-CM | POA: Diagnosis not present

## 2016-08-30 DIAGNOSIS — Z8781 Personal history of (healed) traumatic fracture: Secondary | ICD-10-CM | POA: Diagnosis not present

## 2016-08-30 DIAGNOSIS — S41112D Laceration without foreign body of left upper arm, subsequent encounter: Secondary | ICD-10-CM | POA: Diagnosis not present

## 2016-08-30 DIAGNOSIS — E039 Hypothyroidism, unspecified: Secondary | ICD-10-CM | POA: Diagnosis not present

## 2016-08-30 DIAGNOSIS — M1990 Unspecified osteoarthritis, unspecified site: Secondary | ICD-10-CM | POA: Diagnosis not present

## 2016-08-30 DIAGNOSIS — K589 Irritable bowel syndrome without diarrhea: Secondary | ICD-10-CM | POA: Diagnosis not present

## 2016-08-30 DIAGNOSIS — G4733 Obstructive sleep apnea (adult) (pediatric): Secondary | ICD-10-CM | POA: Diagnosis not present

## 2016-08-30 DIAGNOSIS — I2510 Atherosclerotic heart disease of native coronary artery without angina pectoris: Secondary | ICD-10-CM | POA: Diagnosis not present

## 2016-08-30 DIAGNOSIS — I5022 Chronic systolic (congestive) heart failure: Secondary | ICD-10-CM | POA: Diagnosis not present

## 2016-08-30 DIAGNOSIS — Z96652 Presence of left artificial knee joint: Secondary | ICD-10-CM | POA: Diagnosis not present

## 2016-08-30 DIAGNOSIS — H8109 Meniere's disease, unspecified ear: Secondary | ICD-10-CM | POA: Diagnosis not present

## 2016-08-30 DIAGNOSIS — I132 Hypertensive heart and chronic kidney disease with heart failure and with stage 5 chronic kidney disease, or end stage renal disease: Secondary | ICD-10-CM | POA: Diagnosis not present

## 2016-08-30 DIAGNOSIS — N186 End stage renal disease: Secondary | ICD-10-CM | POA: Diagnosis not present

## 2016-08-31 DIAGNOSIS — Z992 Dependence on renal dialysis: Secondary | ICD-10-CM | POA: Diagnosis not present

## 2016-08-31 DIAGNOSIS — D509 Iron deficiency anemia, unspecified: Secondary | ICD-10-CM | POA: Diagnosis not present

## 2016-08-31 DIAGNOSIS — D631 Anemia in chronic kidney disease: Secondary | ICD-10-CM | POA: Diagnosis not present

## 2016-08-31 DIAGNOSIS — N186 End stage renal disease: Secondary | ICD-10-CM | POA: Diagnosis not present

## 2016-09-01 DIAGNOSIS — N186 End stage renal disease: Secondary | ICD-10-CM | POA: Diagnosis not present

## 2016-09-01 DIAGNOSIS — S51812D Laceration without foreign body of left forearm, subsequent encounter: Secondary | ICD-10-CM | POA: Diagnosis not present

## 2016-09-01 DIAGNOSIS — S41112D Laceration without foreign body of left upper arm, subsequent encounter: Secondary | ICD-10-CM | POA: Diagnosis not present

## 2016-09-01 DIAGNOSIS — M1990 Unspecified osteoarthritis, unspecified site: Secondary | ICD-10-CM | POA: Diagnosis not present

## 2016-09-01 DIAGNOSIS — I5022 Chronic systolic (congestive) heart failure: Secondary | ICD-10-CM | POA: Diagnosis not present

## 2016-09-01 DIAGNOSIS — I132 Hypertensive heart and chronic kidney disease with heart failure and with stage 5 chronic kidney disease, or end stage renal disease: Secondary | ICD-10-CM | POA: Diagnosis not present

## 2016-09-02 ENCOUNTER — Telehealth: Payer: Self-pay | Admitting: *Deleted

## 2016-09-02 DIAGNOSIS — N186 End stage renal disease: Secondary | ICD-10-CM | POA: Diagnosis not present

## 2016-09-02 DIAGNOSIS — D509 Iron deficiency anemia, unspecified: Secondary | ICD-10-CM | POA: Diagnosis not present

## 2016-09-02 DIAGNOSIS — Z992 Dependence on renal dialysis: Secondary | ICD-10-CM | POA: Diagnosis not present

## 2016-09-02 DIAGNOSIS — D631 Anemia in chronic kidney disease: Secondary | ICD-10-CM | POA: Diagnosis not present

## 2016-09-02 NOTE — Telephone Encounter (Signed)
Pt reported having pain on her right side when she takes deep breaths. She requested a call to discuss this issue and furthr treatment . Pt stated that Dr. Caryl Bis know about this . Pt suspects this could be a lung issue.  Pt contact [phone] ask for the charge Nurse at  [facility]  , pt will be receiving care for a few hours there this afternoon.

## 2016-09-03 ENCOUNTER — Telehealth: Payer: Self-pay

## 2016-09-03 ENCOUNTER — Telehealth: Payer: Self-pay | Admitting: *Deleted

## 2016-09-03 DIAGNOSIS — I132 Hypertensive heart and chronic kidney disease with heart failure and with stage 5 chronic kidney disease, or end stage renal disease: Secondary | ICD-10-CM | POA: Diagnosis not present

## 2016-09-03 DIAGNOSIS — S51812D Laceration without foreign body of left forearm, subsequent encounter: Secondary | ICD-10-CM | POA: Diagnosis not present

## 2016-09-03 DIAGNOSIS — N186 End stage renal disease: Secondary | ICD-10-CM | POA: Diagnosis not present

## 2016-09-03 DIAGNOSIS — S41112D Laceration without foreign body of left upper arm, subsequent encounter: Secondary | ICD-10-CM | POA: Diagnosis not present

## 2016-09-03 DIAGNOSIS — I5022 Chronic systolic (congestive) heart failure: Secondary | ICD-10-CM | POA: Diagnosis not present

## 2016-09-03 DIAGNOSIS — M1990 Unspecified osteoarthritis, unspecified site: Secondary | ICD-10-CM | POA: Diagnosis not present

## 2016-09-03 NOTE — Telephone Encounter (Signed)
Please triage

## 2016-09-03 NOTE — Telephone Encounter (Signed)
LMOM for patient to call back.

## 2016-09-03 NOTE — Telephone Encounter (Signed)
Spoke with patient she will have her family member to take her to the ER.

## 2016-09-03 NOTE — Telephone Encounter (Signed)
I will forward this to Melissa to try to straighten this out. Thanks.

## 2016-09-03 NOTE — Telephone Encounter (Signed)
Verbal order given  

## 2016-09-03 NOTE — Telephone Encounter (Signed)
Tried to call patient (previous note was closed re: home health) to tell her that she needs to tell [facility] that she does not want services from them-the preferred number in her chart is her brothers. Spoke to patients brother wife-she was a little short with me and asked what I needed. I told her what is above and she  stated " are you aware that you or your office told her to go to the ER"? At that time I was not aware until she told me that. I told her that I would try her tomorrow.

## 2016-09-03 NOTE — Telephone Encounter (Signed)
Spoke to pt's brother. He is aware that she will need to tell [facility] that they do not want their services.

## 2016-09-03 NOTE — Telephone Encounter (Signed)
See below , thanks

## 2016-09-03 NOTE — Telephone Encounter (Signed)
This problem was not discussed at her last visit. She needs to be evaluated for this. Please advise patient to go to an urgent care or the emergency room given difficulty breathing and sharp pain with taking a deep breath.

## 2016-09-03 NOTE — Telephone Encounter (Signed)
Please advise who patient is seeing for her Home health. She confused with who she is seeing, because she is getting many call from [facility], but [facility] is her home health. Who is she suppose to be seeing. She is tired of many calls and she is wanting to get this straight.

## 2016-09-03 NOTE — Telephone Encounter (Signed)
Please advise 

## 2016-09-03 NOTE — Telephone Encounter (Signed)
Sharon [name] came by the office on Tuesday (8/7) and stated that their PT went to the patients house and went through all of their information, had patient sign papers and once they started discussing the different types of exercises the patient then stated that someone else had been there a couple days prior to them. She did not remember the name of the person or agency and she stated that nothing was left with her, no packet or information and she never signed anything. At that point the PT became concerned since no information was left and found it very concerning that she did not sign anything since (per [facility]) it is the law that patients need to sign papers.  After many many phone calls to see if [facility] or [facility] placed referral for home health, I finally found out that her nursing home placed a referral for her to have [facility] come out. Per [name] @ [phone] (the social worker at that location) she spoke to the patient on 8/8 face to face to clarify this for me since she was concerned as well since no one knew who it was that came out the first time. At that time Sharon [name] told her that she wanted [facility] to continue her care, not [facility]. I spoke to [facility] yesterday and told her that she wanted to continue her care with them. If she does not want [facility], she will need to tell [facility] or get her nursing home to tell them to discontinue her care. I will try to reach out to her to let her know, but don't want to continuously call since she is getting aggravated by this.  Do you know who her nursing home she was at?

## 2016-09-03 NOTE — Telephone Encounter (Signed)
Reason for call: Right side pain near the lungs. Seen at dialysis Bp was low on Monday 8/6/ 18 at 104/44 and was given O2 at the Dialysis clinic all these complication with breathing has happening since Monday. Pain in right side started yesterday 09/02/2016 Symptoms: difficult breathing, sharp pain taking deep breath  Duration 1 day  Medications: none  Last seen for this problem: 08/27/2016 Seen by: Caryl Bis

## 2016-09-03 NOTE — Telephone Encounter (Signed)
I believe she was at [facility]

## 2016-09-03 NOTE — Telephone Encounter (Signed)
LM with Emergency contact  Percell Miller and Charna Archer to have patient contact office. Was unable to communicate with patient. Per Patient family member, Jalaila Caradonna wears hearing aids and sometimes never answers phone.

## 2016-09-03 NOTE — Telephone Encounter (Signed)
Sharon [name] from [facility], has requested verbal orders for home care physical therapy with the frequency of 2 times a week for 4 weeks.  Contact [phone]

## 2016-09-03 NOTE — Telephone Encounter (Signed)
Verbal order can be given. Thanks.

## 2016-09-04 DIAGNOSIS — D509 Iron deficiency anemia, unspecified: Secondary | ICD-10-CM | POA: Diagnosis not present

## 2016-09-04 DIAGNOSIS — D631 Anemia in chronic kidney disease: Secondary | ICD-10-CM | POA: Diagnosis not present

## 2016-09-04 DIAGNOSIS — Z992 Dependence on renal dialysis: Secondary | ICD-10-CM | POA: Diagnosis not present

## 2016-09-04 DIAGNOSIS — N186 End stage renal disease: Secondary | ICD-10-CM | POA: Diagnosis not present

## 2016-09-07 ENCOUNTER — Telehealth: Payer: Self-pay | Admitting: Family Medicine

## 2016-09-07 DIAGNOSIS — Z992 Dependence on renal dialysis: Secondary | ICD-10-CM | POA: Diagnosis not present

## 2016-09-07 DIAGNOSIS — D509 Iron deficiency anemia, unspecified: Secondary | ICD-10-CM | POA: Diagnosis not present

## 2016-09-07 DIAGNOSIS — N186 End stage renal disease: Secondary | ICD-10-CM | POA: Diagnosis not present

## 2016-09-07 DIAGNOSIS — D631 Anemia in chronic kidney disease: Secondary | ICD-10-CM | POA: Diagnosis not present

## 2016-09-07 NOTE — Telephone Encounter (Signed)
Aldrin [phone] called from [facility] regarding requesting to do home health physician therapy in frequency twice a week for 4 weeks. To build her strength and improve her balance and ambulation. VM is ok verbal. Thank you!

## 2016-09-07 NOTE — Telephone Encounter (Signed)
Pt not using [facility]. Sharon [name] from [facility] called to get verbal order for nursing. She were only given orders for PT. [name] [phone]

## 2016-09-07 NOTE — Telephone Encounter (Signed)
Please advise 

## 2016-09-07 NOTE — Telephone Encounter (Signed)
Verbal order can be given.  

## 2016-09-07 NOTE — Telephone Encounter (Signed)
Left message to notify [facility] that patient is not using their services

## 2016-09-08 ENCOUNTER — Telehealth: Payer: Self-pay

## 2016-09-08 DIAGNOSIS — I132 Hypertensive heart and chronic kidney disease with heart failure and with stage 5 chronic kidney disease, or end stage renal disease: Secondary | ICD-10-CM | POA: Diagnosis not present

## 2016-09-08 DIAGNOSIS — N186 End stage renal disease: Secondary | ICD-10-CM | POA: Diagnosis not present

## 2016-09-08 DIAGNOSIS — S51812D Laceration without foreign body of left forearm, subsequent encounter: Secondary | ICD-10-CM | POA: Diagnosis not present

## 2016-09-08 DIAGNOSIS — M1990 Unspecified osteoarthritis, unspecified site: Secondary | ICD-10-CM | POA: Diagnosis not present

## 2016-09-08 DIAGNOSIS — I5022 Chronic systolic (congestive) heart failure: Secondary | ICD-10-CM | POA: Diagnosis not present

## 2016-09-08 DIAGNOSIS — S41112D Laceration without foreign body of left upper arm, subsequent encounter: Secondary | ICD-10-CM | POA: Diagnosis not present

## 2016-09-08 DIAGNOSIS — R634 Abnormal weight loss: Secondary | ICD-10-CM

## 2016-09-08 DIAGNOSIS — R61 Generalized hyperhidrosis: Secondary | ICD-10-CM

## 2016-09-08 NOTE — Telephone Encounter (Signed)
Patient states she will use [facility], she states she would like to have the CT scan set up for a Tuesday and Thursday. Please call patient for this.

## 2016-09-08 NOTE — Telephone Encounter (Signed)
Yes it is, patient states she had just gotten home and did not want this at the time of her last appointment but would like to move forward with this

## 2016-09-08 NOTE — Telephone Encounter (Signed)
Please let the patient know that her CT scan of her abdomen and pelvis has been ordered. I tried to order the CT scan of her chest though there was a warning that Medicare would not cover this given the diagnoses used (unintentional weight loss greater than 10% of body weight and night sweats). The cost would be about $1500 to her potentially if they did not cover it. We'll start with a chest x-ray in addition to the CT scan of her abdomen and pelvis and then reevaluate.

## 2016-09-08 NOTE — Telephone Encounter (Signed)
Please confirm that this is the CT scan chest abdomen and pelvis that we discussed at her last visit. Then I will order.

## 2016-09-08 NOTE — Telephone Encounter (Signed)
Patient will see [facility] and not [facility]

## 2016-09-09 DIAGNOSIS — N186 End stage renal disease: Secondary | ICD-10-CM | POA: Diagnosis not present

## 2016-09-09 DIAGNOSIS — Z992 Dependence on renal dialysis: Secondary | ICD-10-CM | POA: Diagnosis not present

## 2016-09-09 DIAGNOSIS — D631 Anemia in chronic kidney disease: Secondary | ICD-10-CM | POA: Diagnosis not present

## 2016-09-09 DIAGNOSIS — D509 Iron deficiency anemia, unspecified: Secondary | ICD-10-CM | POA: Diagnosis not present

## 2016-09-09 NOTE — Telephone Encounter (Signed)
No show fee was removed.

## 2016-09-09 NOTE — Telephone Encounter (Signed)
Patient notified

## 2016-09-10 DIAGNOSIS — S51812D Laceration without foreign body of left forearm, subsequent encounter: Secondary | ICD-10-CM | POA: Diagnosis not present

## 2016-09-10 DIAGNOSIS — M1990 Unspecified osteoarthritis, unspecified site: Secondary | ICD-10-CM | POA: Diagnosis not present

## 2016-09-10 DIAGNOSIS — S41112D Laceration without foreign body of left upper arm, subsequent encounter: Secondary | ICD-10-CM | POA: Diagnosis not present

## 2016-09-10 DIAGNOSIS — N186 End stage renal disease: Secondary | ICD-10-CM | POA: Diagnosis not present

## 2016-09-10 DIAGNOSIS — I5022 Chronic systolic (congestive) heart failure: Secondary | ICD-10-CM | POA: Diagnosis not present

## 2016-09-10 DIAGNOSIS — I132 Hypertensive heart and chronic kidney disease with heart failure and with stage 5 chronic kidney disease, or end stage renal disease: Secondary | ICD-10-CM | POA: Diagnosis not present

## 2016-09-11 ENCOUNTER — Encounter: Payer: Self-pay | Admitting: Family Medicine

## 2016-09-11 DIAGNOSIS — D631 Anemia in chronic kidney disease: Secondary | ICD-10-CM | POA: Diagnosis not present

## 2016-09-11 DIAGNOSIS — D509 Iron deficiency anemia, unspecified: Secondary | ICD-10-CM | POA: Diagnosis not present

## 2016-09-11 DIAGNOSIS — N186 End stage renal disease: Secondary | ICD-10-CM | POA: Diagnosis not present

## 2016-09-11 DIAGNOSIS — Z992 Dependence on renal dialysis: Secondary | ICD-10-CM | POA: Diagnosis not present

## 2016-09-14 DIAGNOSIS — Z992 Dependence on renal dialysis: Secondary | ICD-10-CM | POA: Diagnosis not present

## 2016-09-14 DIAGNOSIS — D509 Iron deficiency anemia, unspecified: Secondary | ICD-10-CM | POA: Diagnosis not present

## 2016-09-14 DIAGNOSIS — N186 End stage renal disease: Secondary | ICD-10-CM | POA: Diagnosis not present

## 2016-09-14 DIAGNOSIS — D631 Anemia in chronic kidney disease: Secondary | ICD-10-CM | POA: Diagnosis not present

## 2016-09-15 DIAGNOSIS — S51812D Laceration without foreign body of left forearm, subsequent encounter: Secondary | ICD-10-CM | POA: Diagnosis not present

## 2016-09-15 DIAGNOSIS — M1990 Unspecified osteoarthritis, unspecified site: Secondary | ICD-10-CM | POA: Diagnosis not present

## 2016-09-15 DIAGNOSIS — I5022 Chronic systolic (congestive) heart failure: Secondary | ICD-10-CM | POA: Diagnosis not present

## 2016-09-15 DIAGNOSIS — S41112D Laceration without foreign body of left upper arm, subsequent encounter: Secondary | ICD-10-CM | POA: Diagnosis not present

## 2016-09-15 DIAGNOSIS — I132 Hypertensive heart and chronic kidney disease with heart failure and with stage 5 chronic kidney disease, or end stage renal disease: Secondary | ICD-10-CM | POA: Diagnosis not present

## 2016-09-15 DIAGNOSIS — N186 End stage renal disease: Secondary | ICD-10-CM | POA: Diagnosis not present

## 2016-09-17 ENCOUNTER — Telehealth: Payer: Self-pay | Admitting: Family Medicine

## 2016-09-17 ENCOUNTER — Encounter: Payer: Self-pay | Admitting: *Deleted

## 2016-09-17 ENCOUNTER — Ambulatory Visit
Admission: EM | Admit: 2016-09-17 | Discharge: 2016-09-17 | Disposition: A | Discharge status: Home / Self Care | Payer: Medicare Other | Attending: Family Medicine | Admitting: Family Medicine

## 2016-09-17 DIAGNOSIS — L03116 Cellulitis of left lower limb: Secondary | ICD-10-CM

## 2016-09-17 DIAGNOSIS — M1990 Unspecified osteoarthritis, unspecified site: Secondary | ICD-10-CM | POA: Diagnosis not present

## 2016-09-17 DIAGNOSIS — S51812D Laceration without foreign body of left forearm, subsequent encounter: Secondary | ICD-10-CM | POA: Diagnosis not present

## 2016-09-17 DIAGNOSIS — D509 Iron deficiency anemia, unspecified: Secondary | ICD-10-CM | POA: Diagnosis not present

## 2016-09-17 DIAGNOSIS — Z992 Dependence on renal dialysis: Secondary | ICD-10-CM | POA: Diagnosis not present

## 2016-09-17 DIAGNOSIS — D631 Anemia in chronic kidney disease: Secondary | ICD-10-CM | POA: Diagnosis not present

## 2016-09-17 DIAGNOSIS — N186 End stage renal disease: Secondary | ICD-10-CM | POA: Diagnosis not present

## 2016-09-17 DIAGNOSIS — L02416 Cutaneous abscess of left lower limb: Secondary | ICD-10-CM | POA: Diagnosis not present

## 2016-09-17 DIAGNOSIS — I5022 Chronic systolic (congestive) heart failure: Secondary | ICD-10-CM | POA: Diagnosis not present

## 2016-09-17 DIAGNOSIS — S41112D Laceration without foreign body of left upper arm, subsequent encounter: Secondary | ICD-10-CM | POA: Diagnosis not present

## 2016-09-17 DIAGNOSIS — I132 Hypertensive heart and chronic kidney disease with heart failure and with stage 5 chronic kidney disease, or end stage renal disease: Secondary | ICD-10-CM | POA: Diagnosis not present

## 2016-09-17 MED ORDER — MUPIROCIN 2 % EX OINT: 1.0000 "application " | TOPICAL_OINTMENT | Freq: Two times a day (BID) | CUTANEOUS | 0 refills | Status: DC

## 2016-09-17 MED ORDER — SULFAMETHOXAZOLE-TRIMETHOPRIM 800-160 MG PO TABS: 1.0000 | ORAL_TABLET | Freq: Two times a day (BID) | ORAL | 0 refills | Status: DC

## 2016-09-17 MED ORDER — CEFTRIAXONE SODIUM 1 G IJ SOLR
1.0000 g | Freq: Once | INTRAMUSCULAR | Status: AC
  Administered 2016-09-17: 1 g via INTRAMUSCULAR

## 2016-09-17 NOTE — Telephone Encounter (Signed)
Talked with Asencion Partridge from [facility] and she stated that the patient agreed to go to urgent care and stated that her brother or next door neighbor was coming to take the patient to walk in clinic. Also the home health nurse just wanted to [place] that patient was having frequent falls lately, she fell twice yesterday but no injuries noted.

## 2016-09-17 NOTE — Telephone Encounter (Signed)
Asencion Partridge from [facility] called and stated that she was currently with patient and that her left foot has been swollen for about 3 days and red. Asencion Partridge states that it almost looks like a white bug implanted itself, it is about the size of a penny and has a black dot in the middle. Advised that there were no appointments this afternoon that she should go to UC.  Call [facility] @ [phone]

## 2016-09-17 NOTE — ED Provider Notes (Signed)
[facility]    CSN: [id number] Arrival date & time: 09/17/16  1642     History   Chief Complaint Chief Complaint  Patient presents with  . Foot Swelling    HPI CATY TESSLER is a 83 y.o. female.   Patient is an 83 year old white female who presents with swelling of the left lower leg. Patient is on dialysis and apparently this started about 3 days ago. She did not repeat port or get medical care until the home health nurse saw her left leg today and was concerned and recommend some evaluated today. She's not sure how wide the left foot and ankle is swollen. She is not aware of a insect bite though there is one particular area looks like a puncture wound going to her and her brother she is allergic codeine and she states she can't take any narcotics except tramadol. She is a former smoker. She is on dialysis 3 times a week she's had hip replacement surgery both hips multiple medical problems are coronary artery disease obstructive apnea multiple skin cancers.   The history is provided by the patient and a relative. The history is limited by the condition of the patient. No language interpreter was used.  Abscess  Location:  Leg Leg abscess location:  L ankle and L foot Abscess quality: induration, redness and warmth   Red streaking: yes   Progression:  Worsening Chronicity:  New Relieved by:  Nothing Worsened by:  Nothing Ineffective treatments:  None tried   Past Medical History:  Diagnosis Date  . Arthritis   . CAD (coronary artery disease)   . Cancer ([place])    skin  . Cardiomyopathy ([place])   . Depression   . IBS (irritable bowel syndrome) 2010  . Kidney failure July 2012   Hemodialysis 3xweek  . Kidney failure   . Meniere disease   . Meniere's disease   . Myocardial infarction ([place])   . OSA (obstructive sleep apnea)    CPAP  . Peritoneal dialysis status ([place])   . Presence of permanent cardiac pacemaker     Patient Active Problem List   Diagnosis  Date Noted  . Vision changes 08/27/2016  . Hip fracture ([place]) 05/10/2016  . Falls 04/27/2016  . Left hand pain 04/27/2016  . Head injury 04/27/2016  . ESRD on dialysis ([place]) 04/10/2016  . Pressure injury of skin 12/15/2015  . Closed left subtrochanteric femur fracture ([place]) 12/15/2015  . Femur fracture, left ([place]) 12/14/2015  . Meniere disease   . Loss of weight 10/21/2015  . Clinical depression 06/20/2015  . Combined fat and carbohydrate induced hyperlipemia 06/20/2015  . Dysphagia 05/24/2015  . Dyspnea 05/24/2015  . Imbalance 04/22/2015  . Electrical shock sensation in right posterior head 11/22/2014  . Expressive aphasia 10/04/2014  . Breathlessness on exertion 09/13/2014  . Chronic pain syndrome 05/22/2014  . Insomnia 03/13/2014  . Anxiety state 03/13/2014  . Arteriosclerosis of coronary artery 01/15/2014  . Chronic systolic heart failure ([place]) 01/15/2014  . Obstructive apnea 01/15/2014  . Basal cell carcinoma of neck 11/14/2013  . Degeneration of intervertebral disc of cervical region 11/07/2013  . Cervical radiculitis 10/16/2013  . Pelvic pain in female 09/12/2013  . Neck pain of over 3 months duration 09/12/2013  . Memory loss 09/12/2013  . Systolic heart failure, chronic ([place]) 05/12/2013  . Sinus node dysfunction ([place]) 08/01/2012  . Low back pain 08/01/2012  . Cervical spine pain 05/02/2012  . Irritable bowel syndrome 11/02/2011  .  Depression 04/01/2011  . Hypertension 04/01/2011  . Menopausal disorder 04/01/2011    Past Surgical History:  Procedure Laterality Date  . [place]  . ANUS SURGERY  2010  . AV FISTULA PLACEMENT Right 04/15/2016   Procedure: ARTERIOVENOUS (AV) FISTULA CREATION ( RADIOCEPHALIC ) STAGE 2;  Surgeon: Algernon Huxley, MD;  Location: [facility] ORS;  Service: Vascular;  Laterality: Right;  . CATARACT EXTRACTION  2006, 2011  . CHOLECYSTECTOMY  01/2008  . DIALYSIS/PERMA CATHETER REMOVAL N/A 08/20/2016   Procedure: Dialysis/Perma  Catheter Removal;  Surgeon: Algernon Huxley, MD;  Location: [facility];  Service: Cardiovascular;  Laterality: N/A;  . EYE SURGERY     cataracts bilateral  . FEMUR IM NAIL Left 12/15/2015   Procedure: INTRAMEDULLARY (IM) RETROGRADE FEMORAL NAILING;  Surgeon: Oletta Cohn, DO;  Location: [facility] ORS;  Service: Orthopedics;  Laterality: Left;  . HIP PINNING,CANNULATED Left 05/10/2016   Procedure: CANNULATED HIP PINNING;  Surgeon: Earnestine Leys, MD;  Location: [facility] OR[facility];  Service: Orthopedics;  Laterality: Left;  . INSERTION OF DIALYSIS CATHETER  07/2010  . JOINT REPLACEMENT     left knee  . MINOR REMOVAL OF PERITONEAL DIALYSIS CATHETER  04/15/2016   Procedure: MINOR REMOVAL OF PERITONEAL DIALYSIS CATHETER;  Surgeon: Algernon Huxley, MD;  Location: [facility] OR[facility];  Service: Vascular;;  . PACEMAKER INSERTION  2006  . PERIPHERAL VASCULAR CATHETERIZATION N/A 12/18/2015   Procedure: Dialysis/Perma Catheter Insertion;  Surgeon: Katha Cabal, MD;  Location: [facility];  Service: Cardiovascular;  Laterality: N/A;  . TOTAL KNEE ARTHROPLASTY  2008   LEFT/Dr [place]    OB History    No data available       Home Medications    Prior to Admission medications   Medication Sig Start Date End Date Taking? Authorizing Provider  acetaminophen (TYLENOL) 325 MG tablet Take 2 tablets (650 mg total) by mouth every 4 (four) hours as needed for mild pain or moderate pain. 05/14/16  Yes Wieting, Richard, MD  Amino Acids-Protein Hydrolys (FEEDING SUPPLEMENT, PRO-STAT 64,) LIQD Take 30 mLs by mouth at bedtime. (2100)   Yes [provider]  calcitRIOL (ROCALTROL) 0.25 MCG capsule Take 0.25 mcg by mouth daily. (0900)   Yes [provider]  diphenoxylate-atropine (LOMOTIL) 2.5-0.025 MG tablet TAKE 1 TABLET BY MOUTH FOUR TIMES DAILY AS NEEDED FOR DIARRHEA OR LOOSE STOOLS Patient taking differently: TAKE 1 TABLET BY MOUTH every 6 hours AS NEEDED FOR DIARRHEA OR LOOSE STOOLS 08/30/15  Yes [name], MD  folic acid-vitamin b complex-vitamin c-selenium-zinc (DIALYVITE) 3 MG TABS tablet Take 1 tablet by mouth daily. (0900)   Yes [provider]  levothyroxine (SYNTHROID, LEVOTHROID) 25 MCG tablet Take 1 tablet (25 mcg total) by mouth daily. Patient taking differently: Take 25 mcg by mouth daily. (0800) 10/25/15  Yes [name], MD  lidocaine-prilocaine (EMLA) cream Apply 1 application topically every Monday, Wednesday, and Friday with hemodialysis. (1100) apply to right arm dialysis access pre-dialysis wrap with saran wrap after applying.   Yes [provider]  meloxicam (MOBIC) 7.5 MG tablet Take 7.5 mg by mouth daily. (0900)   Yes [provider]  metoprolol succinate (TOPROL XL) 25 MG 24 hr tablet TAKE 1/2 BY MOUTH DAILY Patient taking differently: Take 12.5 mg by mouth daily. (0800) 01/30/16  Yes [name], MD  multivitamin-iron-minerals-folic acid (THERAPEUTIC-M) TABS tablet Take 1 tablet by mouth daily. (0800)   Yes [provider]  potassium chloride (  K-DUR) 10 MEQ tablet Take 10 mEq by mouth daily. (0800) 07/09/16  Yes [provider]  sertraline (ZOLOFT) 50 MG tablet TAKE 3 TABLETS(150 MG) BY MOUTH DAILY Patient taking differently: TAKE 3 TABLETS(150 MG) BY MOUTH DAILY AT 0800 08/30/15  Yes [name], MD  traMADol (ULTRAM) 50 MG tablet Take 1 tablet (50 mg total) by mouth every 12 (twelve) hours as needed for severe pain. 05/14/16  Yes Loletha Grayer, MD  Vitamin D, Ergocalciferol, (DRISDOL) 50000 units CAPS capsule Take 50,000 Units by mouth every Monday. (0900) 04/17/15  Yes [provider]  metoCLOPramide (REGLAN) 5 MG tablet Take 1 tablet (5 mg total) by mouth every 8 (eight) hours as needed (vertigo). Patient not taking: Reported on 08/20/2016 05/30/16   Menshew, Dannielle Karvonen, PA-C  mupirocin ointment (BACTROBAN) 2 % Apply 1 application topically 2 (two) times daily. 09/17/16   Frederich Cha, MD  OVER THE  COUNTER MEDICATION Take 4 oz by mouth 2 (two) times daily with a meal. (1200 & 1700) MAGIC CUPS (FROZEN CUP W/290 CALORIES/9 PROTEIN/NO SUGAR ADDED)    [provider]  sulfamethoxazole-trimethoprim (BACTRIM DS,SEPTRA DS) 800-160 MG tablet Take 1 tablet by mouth 2 (two) times daily. 09/17/16   Frederich Cha, MD    Family History Family History  Problem Relation Age of Onset  . Cancer Mother        ? ovarian - sarcoma  . Cancer Father        Skin cancer  . Diabetes Sister   . COPD Sister   . Depression Sister   . Cancer Sister        Lung - 33 yrs old    Social History Social History  Substance Use Topics  . Smoking status: Former Smoker    Quit date: 03/31/1948  . Smokeless tobacco: Never Used  . Alcohol use No     Allergies   Statins; Effexor [venlafaxine]; and Codeine   Review of Systems Review of Systems  Unable to perform ROS: Other  Musculoskeletal: Positive for gait problem.  Skin: Positive for wound.     Physical Exam Triage Vital Signs ED Triage Vitals  Enc Vitals Group     BP 09/17/16 1655 (!) 152/61     Pulse Rate 09/17/16 1655 79     Resp 09/17/16 1655 16     Temp 09/17/16 1655 98.4 F (36.9 C)     Temp Source 09/17/16 1655 Oral     SpO2 09/17/16 1655 100 %     Weight --      Height 09/17/16 1658 5\' 4"  (1.626 m)     Head Circumference --      Peak Flow --      Pain Score 09/17/16 1658 0     Pain Loc --      Pain Edu? --      Excl. in [place]? --    No data found.   Updated Vital Signs BP (!) 152/61 (BP Location: Left Arm)   Pulse 79   Temp 98.4 F (36.9 C) (Oral)   Resp 16   Ht 5\' 4"  (1.626 m)   SpO2 100%   Visual Acuity Right Eye Distance:   Left Eye Distance:   Bilateral Distance:    Right Eye Near:   Left Eye Near:    Bilateral Near:     Physical Exam  Constitutional:  Non-toxic appearance. She does not have a sickly appearance. She does not appear ill.  Elderly white female  would have to say looks older than stated age    HENT:  Head: Normocephalic and atraumatic.  Right Ear: Decreased hearing is noted.  Left Ear: Decreased hearing is noted.  Hearing is definitely decreased in both ears but his hearing is normal but for the patient even with being loud voice she seemed to have difficulty with medication  Eyes: Pupils are equal, round, and reactive to light.  Neck: Normal range of motion.  Pulmonary/Chest: Effort normal.  Musculoskeletal: She exhibits tenderness.       Feet:  Neurological: She is alert.  Skin: Rash noted. There is erythema.     There is a small black pinpoint lesion that might be a insect sting and surrounding it with less than a half centimeter there is a blister that is white which may be a pus pocket but the size that is still smaller than a push pin head. Around the area is redness and swelling in the foot but nothing that would require I&D or opening  Psychiatric: She has a normal mood and affect.  Vitals reviewed.    UC Treatments / Results  Labs (all labs ordered are listed, but only abnormal results are displayed) Labs Reviewed - No data to display  EKG  EKG Interpretation None       Radiology No results found.  Procedures Procedures (including critical care time)  Medications Ordered in UC Medications  cefTRIAXone (ROCEPHIN) injection 1 g (not administered)     Initial Impression / Assessment and Plan / UC Course  I have reviewed the triage vital signs and the nursing notes.  Pertinent labs & imaging results that were available during my care of the patient were reviewed by me and considered in my medical decision making (see chart for details).    Patient has presbyesophagus cellulitis may be from insect bite not sure we'll give her a gram of Rocephin IM since she's on dialysis we'll place on Septra DS 1 tablet twice a day and Bactroban ointment is no fever but I'm concerned by being on dialysis that the infection may get worse. I've asked her brother the  infection gets worse to please go to the ED of the choice for possible IV antibiotics and possible hospitalization for repeated IV antibiotic treatment   Final Clinical Impressions(s) / UC Diagnoses   Final diagnoses:  Cellulitis of leg, left    New Prescriptions New Prescriptions   MUPIROCIN OINTMENT (BACTROBAN) 2 %    Apply 1 application topically 2 (two) times daily.   SULFAMETHOXAZOLE-TRIMETHOPRIM (BACTRIM DS,SEPTRA DS) 800-160 MG TABLET    Take 1 tablet by mouth 2 (two) times daily.    Note: This dictation was prepared with Dragon dictation along with smaller phrase technology. Any transcriptional errors that result from this process are unintentional. Controlled Substance Prescriptions [place] Controlled Substance Registry consulted? Not Applicable   Frederich Cha, MD 09/17/16 [phone]

## 2016-09-17 NOTE — ED Triage Notes (Signed)
Patient started having symptom of left foot ankle swelling 3 days ago. A visible pustula is visible on the left lateral ankle. Redness and swelling are visible.

## 2016-09-18 DIAGNOSIS — D631 Anemia in chronic kidney disease: Secondary | ICD-10-CM | POA: Diagnosis not present

## 2016-09-18 DIAGNOSIS — N186 End stage renal disease: Secondary | ICD-10-CM | POA: Diagnosis not present

## 2016-09-18 DIAGNOSIS — Z992 Dependence on renal dialysis: Secondary | ICD-10-CM | POA: Diagnosis not present

## 2016-09-18 DIAGNOSIS — D509 Iron deficiency anemia, unspecified: Secondary | ICD-10-CM | POA: Diagnosis not present

## 2016-09-18 NOTE — Telephone Encounter (Signed)
Patient is doing therapy twice a week. Also, patient was in the urgent care yesterday for infection to the left foot, was started on oral and topical antibiotics. Patient was instructed by the urgent care doctor to go to hospital if meds did not help, she may need IV antibiotics.

## 2016-09-18 NOTE — Telephone Encounter (Signed)
Noted. Please see if the patient has been doing physical therapy at home as she may benefit from that given recurrent falls.

## 2016-09-18 NOTE — Telephone Encounter (Signed)
Noted. She should continue the physical therapy. If the area of infection in her left foot has not been improving with the antibiotic she should go to the emergency room for treatment. If it has been improving though it worsens in the future she should go to the emergency room as well. Thanks.

## 2016-09-21 DIAGNOSIS — D631 Anemia in chronic kidney disease: Secondary | ICD-10-CM | POA: Diagnosis not present

## 2016-09-21 DIAGNOSIS — N186 End stage renal disease: Secondary | ICD-10-CM | POA: Diagnosis not present

## 2016-09-21 DIAGNOSIS — Z992 Dependence on renal dialysis: Secondary | ICD-10-CM | POA: Diagnosis not present

## 2016-09-21 DIAGNOSIS — D509 Iron deficiency anemia, unspecified: Secondary | ICD-10-CM | POA: Diagnosis not present

## 2016-09-22 ENCOUNTER — Emergency Department
Admission: EM | Admit: 2016-09-22 | Discharge: 2016-09-22 | Disposition: A | Discharge status: Home / Self Care | Payer: Medicare Other | Attending: Emergency Medicine | Admitting: Emergency Medicine

## 2016-09-22 ENCOUNTER — Emergency Department: Payer: Medicare Other

## 2016-09-22 ENCOUNTER — Encounter: Payer: Self-pay | Admitting: Emergency Medicine

## 2016-09-22 DIAGNOSIS — S0083XA Contusion of other part of head, initial encounter: Secondary | ICD-10-CM | POA: Diagnosis not present

## 2016-09-22 DIAGNOSIS — I2510 Atherosclerotic heart disease of native coronary artery without angina pectoris: Secondary | ICD-10-CM | POA: Insufficient documentation

## 2016-09-22 DIAGNOSIS — I5022 Chronic systolic (congestive) heart failure: Secondary | ICD-10-CM | POA: Diagnosis not present

## 2016-09-22 DIAGNOSIS — Y939 Activity, unspecified: Secondary | ICD-10-CM | POA: Insufficient documentation

## 2016-09-22 DIAGNOSIS — R9431 Abnormal electrocardiogram [ECG] [EKG]: Secondary | ICD-10-CM | POA: Diagnosis not present

## 2016-09-22 DIAGNOSIS — Z950 Presence of cardiac pacemaker: Secondary | ICD-10-CM | POA: Insufficient documentation

## 2016-09-22 DIAGNOSIS — Z87891 Personal history of nicotine dependence: Secondary | ICD-10-CM | POA: Diagnosis not present

## 2016-09-22 DIAGNOSIS — W19XXXA Unspecified fall, initial encounter: Secondary | ICD-10-CM

## 2016-09-22 DIAGNOSIS — S0990XA Unspecified injury of head, initial encounter: Secondary | ICD-10-CM | POA: Diagnosis not present

## 2016-09-22 DIAGNOSIS — S0090XA Unspecified superficial injury of unspecified part of head, initial encounter: Secondary | ICD-10-CM | POA: Diagnosis not present

## 2016-09-22 DIAGNOSIS — Z79899 Other long term (current) drug therapy: Secondary | ICD-10-CM | POA: Insufficient documentation

## 2016-09-22 DIAGNOSIS — Z96652 Presence of left artificial knee joint: Secondary | ICD-10-CM | POA: Diagnosis not present

## 2016-09-22 DIAGNOSIS — Y999 Unspecified external cause status: Secondary | ICD-10-CM | POA: Insufficient documentation

## 2016-09-22 DIAGNOSIS — Y92009 Unspecified place in unspecified non-institutional (private) residence as the place of occurrence of the external cause: Secondary | ICD-10-CM | POA: Insufficient documentation

## 2016-09-22 DIAGNOSIS — I132 Hypertensive heart and chronic kidney disease with heart failure and with stage 5 chronic kidney disease, or end stage renal disease: Secondary | ICD-10-CM | POA: Insufficient documentation

## 2016-09-22 DIAGNOSIS — R110 Nausea: Secondary | ICD-10-CM | POA: Diagnosis not present

## 2016-09-22 DIAGNOSIS — W07XXXA Fall from chair, initial encounter: Secondary | ICD-10-CM | POA: Diagnosis not present

## 2016-09-22 DIAGNOSIS — N186 End stage renal disease: Secondary | ICD-10-CM | POA: Diagnosis not present

## 2016-09-22 DIAGNOSIS — R51 Headache: Secondary | ICD-10-CM | POA: Diagnosis not present

## 2016-09-22 DIAGNOSIS — S3993XA Unspecified injury of pelvis, initial encounter: Secondary | ICD-10-CM | POA: Diagnosis not present

## 2016-09-22 DIAGNOSIS — S199XXA Unspecified injury of neck, initial encounter: Secondary | ICD-10-CM | POA: Diagnosis not present

## 2016-09-22 NOTE — ED Provider Notes (Signed)
[facility] Emergency Department Provider Note   ____________________________________________    I have reviewed the triage vital signs and the nursing notes.   HISTORY  Chief Complaint Fall     HPI Sharon [name] is a 83 y.o. female who presents after a fall. Patient reports she slipped out of her recliner and struck the right side of her head on the floor. She denies loss of consciousness. No neuro deficits. Not on blood thinners. She does report some left-sided hip pain as well although she reports this is a constant problem for her. She also wants her left foot checked which is being treated for cellulitis. She reports it is feeling better and looking better to her. No fevers or chills.   Past Medical History:  Diagnosis Date  . Arthritis   . CAD (coronary artery disease)   . Cancer ([place])    skin  . Cardiomyopathy ([place])   . Depression   . IBS (irritable bowel syndrome) 2010  . Kidney failure July 2012   Hemodialysis 3xweek  . Kidney failure   . Meniere disease   . Meniere's disease   . Myocardial infarction ([place])   . OSA (obstructive sleep apnea)    CPAP  . Peritoneal dialysis status ([place])   . Presence of permanent cardiac pacemaker     Patient Active Problem List   Diagnosis Date Noted  . Vision changes 08/27/2016  . Hip fracture ([place]) 05/10/2016  . Falls 04/27/2016  . Left hand pain 04/27/2016  . Head injury 04/27/2016  . ESRD on dialysis ([place]) 04/10/2016  . Pressure injury of skin 12/15/2015  . Closed left subtrochanteric femur fracture ([place]) 12/15/2015  . Femur fracture, left ([place]) 12/14/2015  . Meniere disease   . Loss of weight 10/21/2015  . Clinical depression 06/20/2015  . Combined fat and carbohydrate induced hyperlipemia 06/20/2015  . Dysphagia 05/24/2015  . Dyspnea 05/24/2015  . Imbalance 04/22/2015  . Electrical shock sensation in right posterior head 11/22/2014  . Expressive aphasia 10/04/2014  .  Breathlessness on exertion 09/13/2014  . Chronic pain syndrome 05/22/2014  . Insomnia 03/13/2014  . Anxiety state 03/13/2014  . Arteriosclerosis of coronary artery 01/15/2014  . Chronic systolic heart failure ([place]) 01/15/2014  . Obstructive apnea 01/15/2014  . Basal cell carcinoma of neck 11/14/2013  . Degeneration of intervertebral disc of cervical region 11/07/2013  . Cervical radiculitis 10/16/2013  . Pelvic pain in female 09/12/2013  . Neck pain of over 3 months duration 09/12/2013  . Memory loss 09/12/2013  . Systolic heart failure, chronic ([place]) 05/12/2013  . Sinus node dysfunction ([place]) 08/01/2012  . Low back pain 08/01/2012  . Cervical spine pain 05/02/2012  . Irritable bowel syndrome 11/02/2011  . Depression 04/01/2011  . Hypertension 04/01/2011  . Menopausal disorder 04/01/2011    Past Surgical History:  Procedure Laterality Date  . [place]  . ANUS SURGERY  2010  . AV FISTULA PLACEMENT Right 04/15/2016   Procedure: ARTERIOVENOUS (AV) FISTULA CREATION ( RADIOCEPHALIC ) STAGE 2;  Surgeon: Algernon Huxley, MD;  Location: [facility] ORS;  Service: Vascular;  Laterality: Right;  . CATARACT EXTRACTION  2006, 2011  . CHOLECYSTECTOMY  01/2008  . DIALYSIS/PERMA CATHETER REMOVAL N/A 08/20/2016   Procedure: Dialysis/Perma Catheter Removal;  Surgeon: Algernon Huxley, MD;  Location: [place] [facility];  Service: Cardiovascular;  Laterality: N/A;  . EYE SURGERY     cataracts bilateral  . FEMUR IM NAIL Left 12/15/2015  Procedure: INTRAMEDULLARY (IM) RETROGRADE FEMORAL NAILING;  Surgeon: Oletta Cohn, DO;  Location: [facility] ORS;  Service: Orthopedics;  Laterality: Left;  . HIP PINNING,CANNULATED Left 05/10/2016   Procedure: CANNULATED HIP PINNING;  Surgeon: Earnestine Leys, MD;  Location: [facility] ORS;  Service: Orthopedics;  Laterality: Left;  . INSERTION OF DIALYSIS CATHETER  07/2010  . JOINT REPLACEMENT     left knee  . MINOR REMOVAL OF PERITONEAL DIALYSIS CATHETER  04/15/2016    Procedure: MINOR REMOVAL OF PERITONEAL DIALYSIS CATHETER;  Surgeon: Algernon Huxley, MD;  Location: [facility] ORS;  Service: Vascular;;  . PACEMAKER INSERTION  2006  . PERIPHERAL VASCULAR CATHETERIZATION N/A 12/18/2015   Procedure: Dialysis/Perma Catheter Insertion;  Surgeon: Katha Cabal, MD;  Location: [place] [facility];  Service: Cardiovascular;  Laterality: N/A;  . TOTAL KNEE ARTHROPLASTY  2008   LEFT/Dr [place]    Prior to Admission medications   Medication Sig Start Date End Date Taking? Authorizing Provider  acetaminophen (TYLENOL) 325 MG tablet Take 2 tablets (650 mg total) by mouth every 4 (four) hours as needed for mild pain or moderate pain. 05/14/16   Loletha Grayer, MD  Amino Acids-Protein Hydrolys (FEEDING SUPPLEMENT, PRO-STAT 64,) LIQD Take 30 mLs by mouth at bedtime. (2100)    [provider]  calcitRIOL (ROCALTROL) 0.25 MCG capsule Take 0.25 mcg by mouth daily. (0900)    [provider]  diphenoxylate-atropine (LOMOTIL) 2.5-0.025 MG tablet TAKE 1 TABLET BY MOUTH FOUR TIMES DAILY AS NEEDED FOR DIARRHEA OR LOOSE STOOLS Patient taking differently: TAKE 1 TABLET BY MOUTH every 6 hours AS NEEDED FOR DIARRHEA OR LOOSE STOOLS 08/30/15   [name], MD  folic acid-vitamin b complex-vitamin c-selenium-zinc (DIALYVITE) 3 MG TABS tablet Take 1 tablet by mouth daily. (0900)    [provider]  levothyroxine (SYNTHROID, LEVOTHROID) 25 MCG tablet Take 1 tablet (25 mcg total) by mouth daily. Patient taking differently: Take 25 mcg by mouth daily. (0800) 10/25/15   [name], MD  lidocaine-prilocaine (EMLA) cream Apply 1 application topically every Monday, Wednesday, and Friday with hemodialysis. (1100) apply to right arm dialysis access pre-dialysis wrap with saran wrap after applying.    [provider]  meloxicam (MOBIC) 7.5 MG tablet Take 7.5 mg by mouth daily. (0900)    [provider]  metoCLOPramide (REGLAN) 5 MG tablet Take 1 tablet  (5 mg total) by mouth every 8 (eight) hours as needed (vertigo). Patient not taking: Reported on 08/20/2016 05/30/16   Menshew, Dannielle Karvonen, PA-C  metoprolol succinate (TOPROL XL) 25 MG 24 hr tablet TAKE 1/2 BY MOUTH DAILY Patient taking differently: Take 12.5 mg by mouth daily. (0800) 01/30/16   [name], MD  multivitamin-iron-minerals-folic acid (THERAPEUTIC-M) TABS tablet Take 1 tablet by mouth daily. (0800)    [provider]  mupirocin ointment (BACTROBAN) 2 % Apply 1 application topically 2 (two) times daily. 09/17/16   Frederich Cha, MD  OVER THE COUNTER MEDICATION Take 4 oz by mouth 2 (two) times daily with a meal. (1200 & 1700) MAGIC CUPS (FROZEN CUP W/290 CALORIES/9 PROTEIN/NO SUGAR ADDED)    [provider]  potassium chloride (K-DUR) 10 MEQ tablet Take 10 mEq by mouth daily. (0800) 07/09/16   [provider]  sertraline (ZOLOFT) 50 MG tablet TAKE 3 TABLETS(150 MG) BY MOUTH DAILY Patient taking differently: TAKE 3 TABLETS(150 MG) BY MOUTH DAILY AT 0800 08/30/15   [name], MD  sulfamethoxazole-trimethoprim (BACTRIM DS,SEPTRA DS) 800-160 MG tablet Take 1 tablet  by mouth 2 (two) times daily. 09/17/16   Frederich Cha, MD  traMADol (ULTRAM) 50 MG tablet Take 1 tablet (50 mg total) by mouth every 12 (twelve) hours as needed for severe pain. 05/14/16   Loletha Grayer, MD  Vitamin D, Ergocalciferol, (DRISDOL) 50000 units CAPS capsule Take 50,000 Units by mouth every Monday. (0900) 04/17/15   [provider]     Allergies Statins; Effexor [venlafaxine]; and Codeine  Family History  Problem Relation Age of Onset  . Cancer Mother        ? ovarian - sarcoma  . Cancer Father        Skin cancer  . Diabetes Sister   . COPD Sister   . Depression Sister   . Cancer Sister        Lung - 84 yrs old    Social History Social History  Substance Use Topics  . Smoking status: Former Smoker    Quit date: 03/31/1948  . Smokeless tobacco: Never Used    . Alcohol use No    Review of Systems  Constitutional: No Dizziness Eyes: No visual changes.  ENT: No no neck pain Cardiovascular: Denies chest pain. Respiratory: Denies shortness of breath. Gastrointestinal: No abdominal pain.   Genitourinary: Negative for dysuria. Musculoskeletal: Left hip pain, possibly chronic Skin: Negative for rash. Neurological: Negative forFocal weakness   ____________________________________________   PHYSICAL EXAM:  VITAL SIGNS: ED Triage Vitals [09/22/16 0920]  Enc Vitals Group     BP (!) 156/79     Pulse Rate 75     Resp 20     Temp 97.9 F (36.6 C)     Temp Source Oral     SpO2 95 %     Weight      Height      Head Circumference      Peak Flow      Pain Score 3     Pain Loc      Pain Edu?      Excl. in [place]?     Constitutional: Alert and oriented. No acute distress. Pleasant and interactive Eyes: Conjunctivae are normal.  Head: Hematoma over the right temple Nose: No congestion/rhinnorhea. Mouth/Throat: Mucous membranes are moist.   Neck:  Painless ROM, no vertebral tenderness Cardiovascular: Normal rate, regular rhythm. Kermit Balo peripheral circulation. Respiratory: Normal respiratory effort.  No retractions. Lungs CTAB. Gastrointestinal: Soft and nontender. No distention.  No CVA tenderness. Genitourinary: deferred Musculoskeletal: Normal range of motion of both lower extremities, no pain with axial load on both hips.  Warm and well perfused Neurologic:  Normal speech and language. No gross focal neurologic deficits are appreciated.  Skin:  Skin is warm, dry and intact. Mild erythema noted to the left foot, no tenderness to palpation, 2+ distal pulse. Psychiatric: Mood and affect are normal. Speech and behavior are normal.  ____________________________________________   LABS (all labs ordered are listed, but only abnormal results are displayed)  Labs Reviewed - No data to  display ____________________________________________  EKG  ED ECG REPORT I, Lavonia Drafts, the attending physician, personally viewed and interpreted this ECG.  Date: 09/22/2016  Rhythm: Paced rhythm QRS Axis: normal Intervals: Abnormal ST/T Wave abnormalities: normal   ____________________________________________  RADIOLOGY  CT head No acute distress Pelvis x-ray no acute distress ____________________________________________   PROCEDURES  Procedure(s) performed: No    Critical Care performed: No ____________________________________________   INITIAL IMPRESSION / ASSESSMENT AND PLAN / ED COURSE  Pertinent labs & imaging results that were  available during my care of the patient were reviewed by me and considered in my medical decision making (see chart for details).  Patient well-appearing and in no acute distress, we'll obtain images of the head and pelvis to rule out injury. Cellulitis of the left foot appears to be improving, this is being treated as an outpatient and I think it is appropriate to continue this course. ____________________________________________   FINAL CLINICAL IMPRESSION(S) / ED DIAGNOSES  Final diagnoses:  Fall, initial encounter  Minor head injury, initial encounter      NEW MEDICATIONS STARTED DURING THIS VISIT:  New Prescriptions   No medications on file     Note:  This document was prepared using Dragon voice recognition software and may include unintentional dictation errors.    Lavonia Drafts, MD 09/22/16 1038

## 2016-09-22 NOTE — ED Notes (Signed)
Patient transported to X-ray 

## 2016-09-22 NOTE — ED Notes (Signed)
Pt states she has reached out to her brother and he will be on the way to transport her home.

## 2016-09-22 NOTE — ED Notes (Signed)
Pt returns from xray and CT.  

## 2016-09-22 NOTE — ED Notes (Signed)
Pt ready to be discharged and will call for a ride.

## 2016-09-22 NOTE — ED Triage Notes (Signed)
Pt to ed via ems from home with reports of sliding out of her recliner and hitting the right side of her head, pt states hit her head on the floor. Pt denies any LOC at time of fall. Ems reports all vitals stable. Pt with recent falls.  Pt with hx of dialysis, hip surgery and has pacemaker.  Pt with recent rehab for hip surgery.  Pt currently being treated for a left foot infection with PO antibiotics. Left foot noted to be red and swollen.

## 2016-09-23 ENCOUNTER — Ambulatory Visit: Payer: Medicare Other

## 2016-09-23 DIAGNOSIS — Z992 Dependence on renal dialysis: Secondary | ICD-10-CM | POA: Diagnosis not present

## 2016-09-23 DIAGNOSIS — N186 End stage renal disease: Secondary | ICD-10-CM | POA: Diagnosis not present

## 2016-09-23 DIAGNOSIS — D631 Anemia in chronic kidney disease: Secondary | ICD-10-CM | POA: Diagnosis not present

## 2016-09-23 DIAGNOSIS — D509 Iron deficiency anemia, unspecified: Secondary | ICD-10-CM | POA: Diagnosis not present

## 2016-09-24 DIAGNOSIS — N186 End stage renal disease: Secondary | ICD-10-CM | POA: Diagnosis not present

## 2016-09-24 DIAGNOSIS — S41112D Laceration without foreign body of left upper arm, subsequent encounter: Secondary | ICD-10-CM | POA: Diagnosis not present

## 2016-09-24 DIAGNOSIS — S51812D Laceration without foreign body of left forearm, subsequent encounter: Secondary | ICD-10-CM | POA: Diagnosis not present

## 2016-09-24 DIAGNOSIS — M1990 Unspecified osteoarthritis, unspecified site: Secondary | ICD-10-CM | POA: Diagnosis not present

## 2016-09-24 DIAGNOSIS — I132 Hypertensive heart and chronic kidney disease with heart failure and with stage 5 chronic kidney disease, or end stage renal disease: Secondary | ICD-10-CM | POA: Diagnosis not present

## 2016-09-24 DIAGNOSIS — I5022 Chronic systolic (congestive) heart failure: Secondary | ICD-10-CM | POA: Diagnosis not present

## 2016-09-25 ENCOUNTER — Encounter: Payer: Self-pay | Admitting: *Deleted

## 2016-09-25 ENCOUNTER — Ambulatory Visit
Admission: EM | Admit: 2016-09-25 | Discharge: 2016-09-25 | Discharge status: Against Medical Advice | Payer: Medicare Other | Attending: Family Medicine | Admitting: Family Medicine

## 2016-09-25 ENCOUNTER — Telehealth: Payer: Self-pay

## 2016-09-25 ENCOUNTER — Ambulatory Visit: Payer: Medicare Other | Admitting: Family Medicine

## 2016-09-25 DIAGNOSIS — M549 Dorsalgia, unspecified: Secondary | ICD-10-CM | POA: Diagnosis not present

## 2016-09-25 DIAGNOSIS — M25552 Pain in left hip: Secondary | ICD-10-CM

## 2016-09-25 DIAGNOSIS — M79605 Pain in left leg: Secondary | ICD-10-CM

## 2016-09-25 DIAGNOSIS — Z992 Dependence on renal dialysis: Secondary | ICD-10-CM | POA: Diagnosis not present

## 2016-09-25 DIAGNOSIS — R296 Repeated falls: Secondary | ICD-10-CM

## 2016-09-25 DIAGNOSIS — N186 End stage renal disease: Secondary | ICD-10-CM | POA: Diagnosis not present

## 2016-09-25 NOTE — ED Triage Notes (Signed)
Patient has had multiple falls in the last 3-4 days. Patient was seen in the ED 09/22/16 for a fall. Symptoms of lower back pain and spasms in the buttocks are present.

## 2016-09-25 NOTE — Telephone Encounter (Signed)
Pt came in today under the impression she still had an appointment with Dr. Caryl Bis. POA had cancelled appointment without her knowing. Patient stated she fell yesterday and her walker hit her in the mouth. She also says that she has a knot on her lower back. Patient was advised to go to urgent care. Patient was triaged for left leg cellulitis on 09/17/2016. Patient says her leg is a lot better today than it was initially. There was still some swelling and redness but was not warm to touch. Patient and neighbor stated they were going to [facility] urgent care to be evaluated.

## 2016-09-25 NOTE — ED Provider Notes (Signed)
[facility] ____________________________________________  Time seen: Approximately 145 PM  I have reviewed the triage vital signs and the nursing notes.   HISTORY  Chief Complaint Back Pain   HPI Sharon [name] is a 83 y.o. female with coronary artery disease, end-stage renal disease on dialysis, congestive heart failure, pacemaker, with left femur and hand fracture in 2017, presenting with neighbor at bedside for evaluation of multiple falls and multiple areas of pain. Reports patient was seen in emergency room for similar 3 days ago after having a fall and hitting her head. Patient reports falling again twice yesterday. In discussing and asking circumstances leading up to fall and asking patient to describe fall, patient was unable to state the circumstances around the fall. Patient reports that she did fall twice yesterday, one of which she is cued herself around on the floor. Then states another point she was in her kitchen and fell from the chair backwards hitting her face.intermittent report of spasms in room to back and hip. Patient reports that she did hit her head and her face yesterday. Patient reports that she has had increased low back and pelvic pain since these falls. Neighbor at bedside reports she has had a proximally 10 falls in the last month. States that she was discharged from rehabilitation July 24. States that she was seen by her primary care August 2. Also reports that she is a Monday when they dialysis patient, has not yet had dialysis today.  Reports patient is normally ambulatory by self with a rolling walker. Reports she continues to ambulate some but her pain causes her in relation to be more difficult. Patient states that the falls over the last few days have been different than her previous falls in the past that were more so related to her Mnire's disease. Patient again states she is unable to explain exactly how she fell or describe how she fell over  the last few days. Denies any known loss of consciousness. States she is having a lot of pain to her low back left hip and leg with intermittent accompanying spasms. States she has chronic neck pain, denies any changes. Patient states a dialysis they have been checking her blood work and it has been "just fine".  Denies chest pain, shortness of breath, abdominal pain, dysuria or paresthesias. Recently treated for left lower leg cellulitis, states still on antibiotics and feeling better.  [name], MD: PCP   Past Medical History:  Diagnosis Date  . Arthritis   . CAD (coronary artery disease)   . Cancer ([place])    skin  . Cardiomyopathy ([place])   . Depression   . IBS (irritable bowel syndrome) 2010  . Kidney failure July 2012   Hemodialysis 3xweek  . Kidney failure   . Meniere disease   . Meniere's disease   . Myocardial infarction ([place])   . OSA (obstructive sleep apnea)    CPAP  . Peritoneal dialysis status ([place])   . Presence of permanent cardiac pacemaker     Patient Active Problem List   Diagnosis Date Noted  . Vision changes 08/27/2016  . Hip fracture ([place]) 05/10/2016  . Falls 04/27/2016  . Left hand pain 04/27/2016  . Head injury 04/27/2016  . ESRD on dialysis ([place]) 04/10/2016  . Pressure injury of skin 12/15/2015  . Closed left subtrochanteric femur fracture ([place]) 12/15/2015  . Femur fracture, left ([place]) 12/14/2015  . Meniere disease   . Loss of weight 10/21/2015  . Clinical depression  06/20/2015  . Combined fat and carbohydrate induced hyperlipemia 06/20/2015  . Dysphagia 05/24/2015  . Dyspnea 05/24/2015  . Imbalance 04/22/2015  . Electrical shock sensation in right posterior head 11/22/2014  . Expressive aphasia 10/04/2014  . Breathlessness on exertion 09/13/2014  . Chronic pain syndrome 05/22/2014  . Insomnia 03/13/2014  . Anxiety state 03/13/2014  . Arteriosclerosis of coronary artery 01/15/2014  . Chronic systolic heart failure ([place]) 01/15/2014  .  Obstructive apnea 01/15/2014  . Basal cell carcinoma of neck 11/14/2013  . Degeneration of intervertebral disc of cervical region 11/07/2013  . Cervical radiculitis 10/16/2013  . Pelvic pain in female 09/12/2013  . Neck pain of over 3 months duration 09/12/2013  . Memory loss 09/12/2013  . Systolic heart failure, chronic ([place]) 05/12/2013  . Sinus node dysfunction ([place]) 08/01/2012  . Low back pain 08/01/2012  . Cervical spine pain 05/02/2012  . Irritable bowel syndrome 11/02/2011  . Depression 04/01/2011  . Hypertension 04/01/2011  . Menopausal disorder 04/01/2011    Past Surgical History:  Procedure Laterality Date  . [place]  . ANUS SURGERY  2010  . AV FISTULA PLACEMENT Right 04/15/2016   Procedure: ARTERIOVENOUS (AV) FISTULA CREATION ( RADIOCEPHALIC ) STAGE 2;  Surgeon: Algernon Huxley, MD;  Location: [facility];  Service: Vascular;  Laterality: Right;  . CATARACT EXTRACTION  2006, 2011  . CHOLECYSTECTOMY  01/2008  . DIALYSIS/PERMA CATHETER REMOVAL N/A 08/20/2016   Procedure: Dialysis/Perma Catheter Removal;  Surgeon: Algernon Huxley, MD;  Location: [facility];  Service: Cardiovascular;  Laterality: N/A;  . EYE SURGERY     cataracts bilateral  . FEMUR IM NAIL Left 12/15/2015   Procedure: INTRAMEDULLARY (IM) RETROGRADE FEMORAL NAILING;  Surgeon: Oletta Cohn, DO;  Location: [facility];  Service: Orthopedics;  Laterality: Left;  . HIP PINNING,CANNULATED Left 05/10/2016   Procedure: CANNULATED HIP PINNING;  Surgeon: Earnestine Leys, MD;  Location: [facility];  Service: Orthopedics;  Laterality: Left;  . INSERTION OF DIALYSIS CATHETER  07/2010  . JOINT REPLACEMENT     left knee  . MINOR REMOVAL OF PERITONEAL DIALYSIS CATHETER  04/15/2016   Procedure: MINOR REMOVAL OF PERITONEAL DIALYSIS CATHETER;  Surgeon: Algernon Huxley, MD;  Location: [facility];  Service: Vascular;;  . PACEMAKER INSERTION  2006  . PERIPHERAL VASCULAR CATHETERIZATION N/A 12/18/2015   Procedure:  Dialysis/Perma Catheter Insertion;  Surgeon: Katha Cabal, MD;  Location: [facility];  Service: Cardiovascular;  Laterality: N/A;  . TOTAL KNEE ARTHROPLASTY  2008   LEFT/Dr [place]     No current facility-administered medications for this encounter.   Current Outpatient Prescriptions:  .  acetaminophen (TYLENOL) 325 MG tablet, Take 2 tablets (650 mg total) by mouth every 4 (four) hours as needed for mild pain or moderate pain., Disp: , Rfl:  .  Amino Acids-Protein Hydrolys (FEEDING SUPPLEMENT, PRO-STAT 64,) LIQD, Take 30 mLs by mouth at bedtime. (2100), Disp: , Rfl:  .  calcitRIOL (ROCALTROL) 0.25 MCG capsule, Take 0.25 mcg by mouth daily. (0900), Disp: , Rfl:  .  diphenoxylate-atropine (LOMOTIL) 2.5-0.025 MG tablet, TAKE 1 TABLET BY MOUTH FOUR TIMES DAILY AS NEEDED FOR DIARRHEA OR LOOSE STOOLS (Patient taking differently: TAKE 1 TABLET BY MOUTH every 6 hours AS NEEDED FOR DIARRHEA OR LOOSE STOOLS), Disp: 30 tablet, Rfl: 5 .  folic acid-vitamin b complex-vitamin c-selenium-zinc (DIALYVITE) 3 MG TABS tablet, Take 1 tablet by mouth daily. (0900), Disp: , Rfl:  .  levothyroxine (SYNTHROID, LEVOTHROID) 25 MCG  tablet, Take 1 tablet (25 mcg total) by mouth daily. (Patient taking differently: Take 25 mcg by mouth daily. (0800)), Disp: 90 tablet, Rfl: 1 .  lidocaine-prilocaine (EMLA) cream, Apply 1 application topically every Monday, Wednesday, and Friday with hemodialysis. (1100) apply to right arm dialysis access pre-dialysis wrap with saran wrap after applying., Disp: , Rfl:  .  meloxicam (MOBIC) 7.5 MG tablet, Take 7.5 mg by mouth daily. (0900), Disp: , Rfl:  .  metoCLOPramide (REGLAN) 5 MG tablet, Take 1 tablet (5 mg total) by mouth every 8 (eight) hours as needed (vertigo). (Patient not taking: Reported on 08/20/2016), Disp: 15 tablet, Rfl: 0 .  metoprolol succinate (TOPROL XL) 25 MG 24 hr tablet, TAKE 1/2 BY MOUTH DAILY (Patient taking differently: Take 12.5 mg by mouth daily. (0800)),  Disp: 30 tablet, Rfl: 11 .  multivitamin-iron-minerals-folic acid (THERAPEUTIC-M) TABS tablet, Take 1 tablet by mouth daily. (0800), Disp: , Rfl:  .  mupirocin ointment (BACTROBAN) 2 %, Apply 1 application topically 2 (two) times daily., Disp: 22 g, Rfl: 0 .  OVER THE COUNTER MEDICATION, Take 4 oz by mouth 2 (two) times daily with a meal. (1200 & 1700) MAGIC CUPS (FROZEN CUP W/290 CALORIES/9 PROTEIN/NO SUGAR ADDED), Disp: , Rfl:  .  potassium chloride (K-DUR) 10 MEQ tablet, Take 10 mEq by mouth daily. (0800), Disp: , Rfl:  .  sertraline (ZOLOFT) 50 MG tablet, TAKE 3 TABLETS(150 MG) BY MOUTH DAILY (Patient taking differently: TAKE 3 TABLETS(150 MG) BY MOUTH DAILY AT 0800), Disp: 90 tablet, Rfl: 2 .  sulfamethoxazole-trimethoprim (BACTRIM DS,SEPTRA DS) 800-160 MG tablet, Take 1 tablet by mouth 2 (two) times daily., Disp: 20 tablet, Rfl: 0 .  traMADol (ULTRAM) 50 MG tablet, Take 1 tablet (50 mg total) by mouth every 12 (twelve) hours as needed for severe pain., Disp: 10 tablet, Rfl: 0 .  Vitamin D, Ergocalciferol, (DRISDOL) 50000 units CAPS capsule, Take 50,000 Units by mouth every Monday. (0900), Disp: , Rfl:   Allergies Statins; Effexor [venlafaxine]; and Codeine  Family History  Problem Relation Age of Onset  . Cancer Mother        ? ovarian - sarcoma  . Cancer Father        Skin cancer  . Diabetes Sister   . COPD Sister   . Depression Sister   . Cancer Sister        Lung - 58 yrs old    Social History Social History  Substance Use Topics  . Smoking status: Former Smoker    Quit date: 03/31/1948  . Smokeless tobacco: Never Used  . Alcohol use No    Review of Systems Constitutional: No fever/chills Eyes: No visual changes. Cardiovascular: Denies chest pain. Respiratory: Denies shortness of breath. Gastrointestinal: No abdominal pain.  No nausea, no vomiting.  No diarrhea.  Genitourinary: Negative for dysuria. Musculoskeletal: positive for back pain. Skin: Negative for rash,  except left lower leg. Neurological: Negative for headaches, focal weakness or numbness.    ____________________________________________   PHYSICAL EXAM:  VITAL SIGNS: ED Triage Vitals  Enc Vitals Group     BP 09/25/16 1256 (!) 119/46     Pulse Rate 09/25/16 1256 65     Resp 09/25/16 1256 16     Temp 09/25/16 1256 97.9 F (36.6 C)     Temp Source 09/25/16 1256 Oral     SpO2 09/25/16 1256 97 %     Weight 09/25/16 1300 104 lb (47.2 kg)     Height --  Head Circumference --      Peak Flow --      Pain Score 09/25/16 1301 2     Pain Loc --      Pain Edu? --      Excl. in [place]? --     Constitutional: Alert and oriented. Well appearing and in no acute distress. ENT      Head: Normocephalic. Ecchymosis without tenderness left lower chin. No facial or head tenderness to palpation.      Nose: No congestion/rhinnorhea.      Mouth/Throat: Mucous membranes are moist.Oropharynx non-erythematous. Neck: No stridor. Supple without meningismus.  Hematological/Lymphatic/Immunilogical: No cervical lymphadenopathy. Cardiovascular: Normal rate, regular rhythm. Grossly normal heart sounds.  Good peripheral circulation. Respiratory: Normal respiratory effort without tachypnea nor retractions. Breath sounds are clear and equal bilaterally. No wheezes, rales, rhonchi. Gastrointestinal: Soft and nontender. No distention. Musculoskeletal: Mild diffuse lower lumbar and paralumbar, left hip and left mid femur tenderness to palpation, no ecchymosis noted, patient able to stand with assist and walker and ambulate but with unsteady antalgic gait with increased pain with weightbearing on left hip, bilateral lower extremity otherwise nontender. Neurologic:  Normal speech and language.  Speech is normal. No gait instability.  Skin:  Skin is warm, dry. Mild erythema dorsal ankle, per patient improved and still taking her antibiotic. AV graft right arm. Psychiatric: Mood and affect are normal. Speech and  behavior are normal. Patient exhibits appropriate insight and judgment   ___________________________________________   LABS (all labs ordered are listed, but only abnormal results are displayed)  Labs Reviewed - No data to display ____________________________________________   PROCEDURES Procedures    INITIAL IMPRESSION / ASSESSMENT AND PLAN / ED COURSE  Pertinent labs & imaging results that were available during my care of the patient were reviewed by me and considered in my medical decision making (see chart for details).  Greater than 30 minutes spent in room discussing with patient and patient neighbor. Patient with multiple medical comorbidities, difficult historian with frequent need to refocus, with recent numerous falls, including report of 2 since most recent ER visit. Patient states increased back pelvis and left leg pain since most recent falls. Patient is alert and oriented. No recent laboratory work studies available for comparison and discussed with patient most recent lab visible is from 08/27/2016 TSH. Discussed in detail with patient concern of circumstances around recent falls, increased pain to recent fracture site with difficulty with ambulation, patient lives at home alone, and concern of complications with comorbidities. Discussed in detail with patient and neighbor at bedside concern for need to have further evaluation including likely CT imaging and laboratory studies, as well as concern for her safety at home. Recommended at this time for patient to be seen in further evaluated emergency room. Patient verbalized understanding and states that she does not want to go to ER. Patient further states that she does not want imaging performed at urgent care now at this time. Again rediscussed this with patient and neighbor at bedside, patient again states she understands circumstances and does not want imaging performed in urgent care at this time or blood work, and understands  recommendation is to go to the emergency room. Patient states that she is going to go home and will not go to the ER at this time, but will go later if not feeling better. Patient verbalized understanding that this is AGAINST MEDICAL ADVICE. Patient currently alert and oriented with decisional capacity. Neighbor at [employer], states that she  and her family will keep a close eye on the patient and check and frequently. Counseled regarding concern of pain medication and increased fall risk, patient verbalized understanding.   Discussed follow up with Primary care physician this week. Discussed follow up and return parameters including no resolution or any worsening concerns. Patient verbalized understanding and agreed to plan. Discussed patient and plan of care with Dr Zenda Alpers, who agrees with plan.   ____________________________________________   FINAL CLINICAL IMPRESSION(S) / ED DIAGNOSES  Final diagnoses:  Acute bilateral back pain, unspecified back location  Left hip pain  Left leg pain  Multiple falls     Discharge Medication List as of 09/25/2016  2:17 PM      Note: This dictation was prepared with Dragon dictation along with smaller phrase technology. Any transcriptional errors that result from this process are unintentional.         Marylene Land, NP 09/25/16 1636

## 2016-09-26 ENCOUNTER — Emergency Department: Payer: Medicare Other

## 2016-09-26 ENCOUNTER — Encounter: Payer: Self-pay | Admitting: Emergency Medicine

## 2016-09-26 ENCOUNTER — Emergency Department
Admission: EM | Admit: 2016-09-26 | Discharge: 2016-09-26 | Disposition: A | Discharge status: Home / Self Care | Payer: Medicare Other | Attending: Emergency Medicine | Admitting: Emergency Medicine

## 2016-09-26 DIAGNOSIS — M5440 Lumbago with sciatica, unspecified side: Secondary | ICD-10-CM | POA: Diagnosis not present

## 2016-09-26 DIAGNOSIS — M545 Low back pain: Secondary | ICD-10-CM | POA: Diagnosis not present

## 2016-09-26 DIAGNOSIS — G8929 Other chronic pain: Secondary | ICD-10-CM | POA: Diagnosis not present

## 2016-09-26 DIAGNOSIS — N186 End stage renal disease: Secondary | ICD-10-CM | POA: Diagnosis not present

## 2016-09-26 DIAGNOSIS — I132 Hypertensive heart and chronic kidney disease with heart failure and with stage 5 chronic kidney disease, or end stage renal disease: Secondary | ICD-10-CM | POA: Diagnosis not present

## 2016-09-26 DIAGNOSIS — Z87891 Personal history of nicotine dependence: Secondary | ICD-10-CM | POA: Diagnosis not present

## 2016-09-26 DIAGNOSIS — Z79899 Other long term (current) drug therapy: Secondary | ICD-10-CM | POA: Insufficient documentation

## 2016-09-26 DIAGNOSIS — I110 Hypertensive heart disease with heart failure: Secondary | ICD-10-CM | POA: Diagnosis not present

## 2016-09-26 DIAGNOSIS — Z992 Dependence on renal dialysis: Secondary | ICD-10-CM | POA: Insufficient documentation

## 2016-09-26 DIAGNOSIS — M533 Sacrococcygeal disorders, not elsewhere classified: Secondary | ICD-10-CM | POA: Diagnosis not present

## 2016-09-26 DIAGNOSIS — I5022 Chronic systolic (congestive) heart failure: Secondary | ICD-10-CM | POA: Diagnosis not present

## 2016-09-26 DIAGNOSIS — M549 Dorsalgia, unspecified: Secondary | ICD-10-CM | POA: Diagnosis not present

## 2016-09-26 DIAGNOSIS — W19XXXA Unspecified fall, initial encounter: Secondary | ICD-10-CM | POA: Diagnosis not present

## 2016-09-26 DIAGNOSIS — I2510 Atherosclerotic heart disease of native coronary artery without angina pectoris: Secondary | ICD-10-CM | POA: Diagnosis not present

## 2016-09-26 LAB — COMPREHENSIVE METABOLIC PANEL
ALBUMIN: 3.4 g/dL — AB (ref 3.5–5.0)
ALT: 21 U/L (ref 14–54)
ANION GAP: 11 (ref 5–15)
AST: 33 U/L (ref 15–41)
Alkaline Phosphatase: 143 U/L — ABNORMAL HIGH (ref 38–126)
BUN: 41 mg/dL — ABNORMAL HIGH (ref 6–20)
CHLORIDE: 101 mmol/L (ref 101–111)
CO2: 25 mmol/L (ref 22–32)
Calcium: 8.9 mg/dL (ref 8.9–10.3)
Creatinine, Ser: 3.17 mg/dL — ABNORMAL HIGH (ref 0.44–1.00)
GFR calc non Af Amer: 12 mL/min — ABNORMAL LOW (ref >60)
GFR, EST AFRICAN AMERICAN: 14 mL/min — AB (ref >60)
GLUCOSE: 103 mg/dL — AB (ref 65–99)
POTASSIUM: 4.3 mmol/L (ref 3.5–5.1)
SODIUM: 137 mmol/L (ref 135–145)
Total Bilirubin: 1.6 mg/dL — ABNORMAL HIGH (ref 0.3–1.2)
Total Protein: 6.8 g/dL (ref 6.5–8.1)

## 2016-09-26 LAB — CBC
HCT: 34.6 % — ABNORMAL LOW (ref 35.0–47.0)
HEMOGLOBIN: 11.5 g/dL — AB (ref 12.0–16.0)
MCH: 28.4 pg (ref 26.0–34.0)
MCHC: 33.1 g/dL (ref 32.0–36.0)
MCV: 85.5 fL (ref 80.0–100.0)
PLATELETS: 286 10*3/uL (ref 150–440)
RBC: 4.04 MIL/uL (ref 3.80–5.20)
RDW: 16.4 % — ABNORMAL HIGH (ref 11.5–14.5)
WBC: 8.1 10*3/uL (ref 3.6–11.0)

## 2016-09-26 MED ORDER — TRAMADOL HCL 50 MG PO TABS
50.0000 mg | ORAL_TABLET | Freq: Once | ORAL | Status: AC
  Administered 2016-09-26: 50 mg via ORAL
  Filled 2016-09-26: qty 1

## 2016-09-26 MED ORDER — ACETAMINOPHEN 325 MG PO TABS
650.0000 mg | ORAL_TABLET | Freq: Once | ORAL | Status: AC
  Administered 2016-09-26: 650 mg via ORAL
  Filled 2016-09-26: qty 2

## 2016-09-26 NOTE — ED Notes (Signed)
Nelle Don contacted [phone] with update. States he will be here to pick patient up in about 30 minutes.

## 2016-09-26 NOTE — ED Triage Notes (Signed)
Pt arrived via EMS from home with reports of a fall on Thursday. Pt refused transport on Thursday after the fall. Pt has had multiple falls this week, hit her head and back. Pt c/o today of lumbar and sacral pain. Pt seen at [facility] yesterday, did not do XR.  Pt also c/o nausea. EMS offered pain medication and nausea medication, but pt refused. Pt only wants to take Tramadol.   Pt is on HD MWF but missed her treatment yesterday.

## 2016-09-26 NOTE — Discharge Instructions (Signed)
I discussed with Dr. Holley Raring and he recommends continuing your fluid restrictions and arranging for dialysis on Monday. No dialysis is necessary today.   Your xrays do not show any fractures

## 2016-09-26 NOTE — ED Provider Notes (Signed)
[facility] Emergency Department Provider Note   ____________________________________________    I have reviewed the triage vital signs and the nursing notes.   HISTORY  Chief Complaint Fall and Back Pain  History limited by likely undiagnosed dementia   HPI Sharon [name] is a 83 y.o. female who presents with complaints of tailbone pain and low back pain. I saw patient approximately 5 days ago after a fall where she had normal x-ray of the pelvis and head. Patient has been having frequent falls. Patient has difficulty providing me with history as she clearly has issues with recall/memory. She complains of pain primarily in her tailbone which she reports is moderate to severe. She denies falling again. Apparently she was at urgent care yesterday and refused further imaging and/or transferred to the ED. She is moving all extremities well   Past Medical History:  Diagnosis Date  . Arthritis   . CAD (coronary artery disease)   . Cancer ([place])    skin  . Cardiomyopathy ([place])   . Depression   . IBS (irritable bowel syndrome) 2010  . Kidney failure July 2012   Hemodialysis 3xweek  . Kidney failure   . Meniere disease   . Meniere's disease   . Myocardial infarction ([place])   . OSA (obstructive sleep apnea)    CPAP  . Peritoneal dialysis status ([place])   . Presence of permanent cardiac pacemaker     Patient Active Problem List   Diagnosis Date Noted  . Vision changes 08/27/2016  . Hip fracture ([place]) 05/10/2016  . Falls 04/27/2016  . Left hand pain 04/27/2016  . Head injury 04/27/2016  . ESRD on dialysis ([place]) 04/10/2016  . Pressure injury of skin 12/15/2015  . Closed left subtrochanteric femur fracture ([place]) 12/15/2015  . Femur fracture, left ([place]) 12/14/2015  . Meniere disease   . Loss of weight 10/21/2015  . Clinical depression 06/20/2015  . Combined fat and carbohydrate induced hyperlipemia 06/20/2015  . Dysphagia 05/24/2015  . Dyspnea  05/24/2015  . Imbalance 04/22/2015  . Electrical shock sensation in right posterior head 11/22/2014  . Expressive aphasia 10/04/2014  . Breathlessness on exertion 09/13/2014  . Chronic pain syndrome 05/22/2014  . Insomnia 03/13/2014  . Anxiety state 03/13/2014  . Arteriosclerosis of coronary artery 01/15/2014  . Chronic systolic heart failure ([place]) 01/15/2014  . Obstructive apnea 01/15/2014  . Basal cell carcinoma of neck 11/14/2013  . Degeneration of intervertebral disc of cervical region 11/07/2013  . Cervical radiculitis 10/16/2013  . Pelvic pain in female 09/12/2013  . Neck pain of over 3 months duration 09/12/2013  . Memory loss 09/12/2013  . Systolic heart failure, chronic ([place]) 05/12/2013  . Sinus node dysfunction ([place]) 08/01/2012  . Low back pain 08/01/2012  . Cervical spine pain 05/02/2012  . Irritable bowel syndrome 11/02/2011  . Depression 04/01/2011  . Hypertension 04/01/2011  . Menopausal disorder 04/01/2011    Past Surgical History:  Procedure Laterality Date  . [place]  . ANUS SURGERY  2010  . AV FISTULA PLACEMENT Right 04/15/2016   Procedure: ARTERIOVENOUS (AV) FISTULA CREATION ( RADIOCEPHALIC ) STAGE 2;  Surgeon: Algernon Huxley, MD;  Location: [facility] ORS;  Service: Vascular;  Laterality: Right;  . CATARACT EXTRACTION  2006, 2011  . CHOLECYSTECTOMY  01/2008  . DIALYSIS/PERMA CATHETER REMOVAL N/A 08/20/2016   Procedure: Dialysis/Perma Catheter Removal;  Surgeon: Algernon Huxley, MD;  Location: [facility];  Service: Cardiovascular;  Laterality: N/A;  .  EYE SURGERY     cataracts bilateral  . FEMUR IM NAIL Left 12/15/2015   Procedure: INTRAMEDULLARY (IM) RETROGRADE FEMORAL NAILING;  Surgeon: Oletta Cohn, DO;  Location: [facility] ORS;  Service: Orthopedics;  Laterality: Left;  . HIP PINNING,CANNULATED Left 05/10/2016   Procedure: CANNULATED HIP PINNING;  Surgeon: Earnestine Leys, MD;  Location: [facility] ORS;  Service: Orthopedics;  Laterality: Left;    . INSERTION OF DIALYSIS CATHETER  07/2010  . JOINT REPLACEMENT     left knee  . MINOR REMOVAL OF PERITONEAL DIALYSIS CATHETER  04/15/2016   Procedure: MINOR REMOVAL OF PERITONEAL DIALYSIS CATHETER;  Surgeon: Algernon Huxley, MD;  Location: [facility] ORS;  Service: Vascular;;  . PACEMAKER INSERTION  2006  . PERIPHERAL VASCULAR CATHETERIZATION N/A 12/18/2015   Procedure: Dialysis/Perma Catheter Insertion;  Surgeon: Katha Cabal, MD;  Location: [place] [facility];  Service: Cardiovascular;  Laterality: N/A;  . TOTAL KNEE ARTHROPLASTY  2008   LEFT/Dr [place]    Prior to Admission medications   Medication Sig Start Date End Date Taking? Authorizing Provider  acetaminophen (TYLENOL) 325 MG tablet Take 2 tablets (650 mg total) by mouth every 4 (four) hours as needed for mild pain or moderate pain. 05/14/16  Yes Wieting, Richard, MD  diphenoxylate-atropine (LOMOTIL) 2.5-0.025 MG tablet TAKE 1 TABLET BY MOUTH FOUR TIMES DAILY AS NEEDED FOR DIARRHEA OR LOOSE STOOLS Patient taking differently: TAKE 1 TABLET BY MOUTH every 6 hours AS NEEDED FOR DIARRHEA OR LOOSE STOOLS 08/30/15  Yes [name], MD  folic acid-vitamin b complex-vitamin c-selenium-zinc (DIALYVITE) 3 MG TABS tablet Take 1 tablet by mouth daily. (0900)   Yes [provider]  levothyroxine (SYNTHROID, LEVOTHROID) 25 MCG tablet Take 1 tablet (25 mcg total) by mouth daily. Patient taking differently: Take 25 mcg by mouth daily. (0800) 10/25/15  Yes [name], MD  lidocaine-prilocaine (EMLA) cream Apply 1 application topically every Monday, Wednesday, and Friday with hemodialysis. (1100) apply to right arm dialysis access pre-dialysis wrap with saran wrap after applying.   Yes [provider]  metoCLOPramide (REGLAN) 5 MG tablet Take 1 tablet (5 mg total) by mouth every 8 (eight) hours as needed (vertigo). 05/30/16  Yes Menshew, Dannielle Karvonen, PA-C  metoprolol succinate (TOPROL XL) 25 MG 24 hr tablet TAKE 1/2 BY MOUTH DAILY  01/30/16  Yes [name], MD  mupirocin ointment (BACTROBAN) 2 % Apply 1 application topically 2 (two) times daily. 09/17/16  Yes Frederich Cha, MD  sertraline (ZOLOFT) 50 MG tablet TAKE 3 TABLETS(150 MG) BY MOUTH DAILY 08/30/15  Yes [name], MD  sulfamethoxazole-trimethoprim (BACTRIM DS,SEPTRA DS) 800-160 MG tablet Take 1 tablet by mouth 2 (two) times daily. 09/17/16  Yes Frederich Cha, MD  traMADol (ULTRAM) 50 MG tablet Take 1 tablet (50 mg total) by mouth every 12 (twelve) hours as needed for severe pain. 05/14/16  Yes Loletha Grayer, MD  Vitamin D, Ergocalciferol, (DRISDOL) 50000 units CAPS capsule Take 50,000 Units by mouth every Monday. (0900) 04/17/15  Yes [provider]     Allergies Statins; Effexor [venlafaxine]; and Codeine  Family History  Problem Relation Age of Onset  . Cancer Mother        ? ovarian - sarcoma  . Cancer Father        Skin cancer  . Diabetes Sister   . COPD Sister   . Depression Sister   . Cancer Sister        Lung - 50 yrs old  Social History Social History  Substance Use Topics  . Smoking status: Former Smoker    Quit date: 03/31/1948  . Smokeless tobacco: Never Used  . Alcohol use No    Review of Systems  Constitutional: No fever/chills Eyes: No visual changes.  ENT: No Neck pain Cardiovascular: Denies chest pain. Respiratory: Denies shortness of breath. Gastrointestinal: No abdominal pain.   Genitourinary: Negative for dysuria. Musculoskeletal: Low back pain and tailbone pain Skin: Negative for rash. Neurological: Negative for headaches    ____________________________________________   PHYSICAL EXAM:  VITAL SIGNS: ED Triage Vitals  Enc Vitals Group     BP 09/26/16 0849 (!) 166/75     Pulse Rate 09/26/16 0849 84     Resp 09/26/16 0849 14     Temp 09/26/16 0849 98 F (36.7 C)     Temp Source 09/26/16 0849 Oral     SpO2 09/26/16 0849 95 %     Weight 09/26/16 0845 47.2 kg (104 lb)     Height 09/26/16  0845 1.626 m (5\' 4" )     Head Circumference --      Peak Flow --      Pain Score 09/26/16 0845 10     Pain Loc --      Pain Edu? --      Excl. in [place]? --     Constitutional: Alert. No acute distress. Pleasant and interactive Eyes: Conjunctivae are normal.  Head: Atraumatic. Nose: No congestion/rhinnorhea. Mouth/Throat: Mucous membranes are moist.   Neck:  Painless ROM Cardiovascular: Normal rate, regular rhythm. Grossly normal heart sounds.  Good peripheral circulation. Respiratory: Normal respiratory effort.  No retractions. Lungs CTAB. Gastrointestinal: Soft and nontender. No distention.  No CVA tenderness. Genitourinary: deferred Musculoskeletal: No lower extremity tenderness nor edema.  Warm and well perfused. No vertebral tenderness to palpation. No paraspinal tenderness to palpation. No areas of muscle spasm or tenderness. Pain is primarily limited to the coccyx area. Normal range of motion of lower extremities and upper extremities Neurologic:  Normal speech and language. No gross focal neurologic deficits are appreciated.  Skin:  Skin is warm, dry and intact. No rash noted. Psychiatric: Mood and affect are normal. Speech and behavior are normal.  ____________________________________________   LABS (all labs ordered are listed, but only abnormal results are displayed)  Labs Reviewed  CBC - Abnormal; Notable for the following:       Result Value   Hemoglobin 11.5 (*)    HCT 34.6 (*)    RDW 16.4 (*)    All other components within normal limits  COMPREHENSIVE METABOLIC PANEL - Abnormal; Notable for the following:    Glucose, Bld 103 (*)    BUN 41 (*)    Creatinine, Ser 3.17 (*)    Albumin 3.4 (*)    Alkaline Phosphatase 143 (*)    Total Bilirubin 1.6 (*)    GFR calc non Af Amer 12 (*)    GFR calc Af Amer 14 (*)    All other components within normal limits    ____________________________________________  EKG  None ____________________________________________  RADIOLOGY  X-rays reassuring ____________________________________________   PROCEDURES  Procedure(s) performed: No    Critical Care performed: No ____________________________________________   INITIAL IMPRESSION / ASSESSMENT AND PLAN / ED COURSE  Pertinent labs & imaging results that were available during my care of the patient were reviewed by me and considered in my medical decision making (see chart for details).  Patient overall well-appearing and in no acute distress. Primary complaint  is tailbone pain, given history of multiple falls we will image the low back and tailbone as well as check labs and monitor carefully.  ----------------------------------------- 11:03 AM on 09/26/2016 -----------------------------------------  Patient's imaging is reassuring, lab work is overall unremarkable. She feels well after Tylenol and tramadol. Brother had asked the nurse if she could be dialyzed here however given that she is appropriate for discharge we have called her dialysis center  Discussed this with Dr. Holley Raring who agrees    ____________________________________________   FINAL CLINICAL IMPRESSION(S) / ED DIAGNOSES  Final diagnoses:  Acute bilateral low back pain, with sciatica presence unspecified      NEW MEDICATIONS STARTED DURING THIS VISIT:  New Prescriptions   No medications on file     Note:  This document was prepared using Dragon voice recognition software and may include unintentional dictation errors.    Lavonia Drafts, MD 09/26/16 1120

## 2016-09-28 DIAGNOSIS — D509 Iron deficiency anemia, unspecified: Secondary | ICD-10-CM | POA: Diagnosis not present

## 2016-09-28 DIAGNOSIS — N186 End stage renal disease: Secondary | ICD-10-CM | POA: Diagnosis not present

## 2016-09-28 DIAGNOSIS — Z992 Dependence on renal dialysis: Secondary | ICD-10-CM | POA: Diagnosis not present

## 2016-09-28 DIAGNOSIS — D631 Anemia in chronic kidney disease: Secondary | ICD-10-CM | POA: Diagnosis not present

## 2016-09-29 ENCOUNTER — Telehealth: Payer: Self-pay

## 2016-09-29 DIAGNOSIS — I5022 Chronic systolic (congestive) heart failure: Secondary | ICD-10-CM | POA: Diagnosis not present

## 2016-09-29 DIAGNOSIS — S51812D Laceration without foreign body of left forearm, subsequent encounter: Secondary | ICD-10-CM | POA: Diagnosis not present

## 2016-09-29 DIAGNOSIS — S41112D Laceration without foreign body of left upper arm, subsequent encounter: Secondary | ICD-10-CM | POA: Diagnosis not present

## 2016-09-29 DIAGNOSIS — N186 End stage renal disease: Secondary | ICD-10-CM | POA: Diagnosis not present

## 2016-09-29 DIAGNOSIS — M1990 Unspecified osteoarthritis, unspecified site: Secondary | ICD-10-CM | POA: Diagnosis not present

## 2016-09-29 DIAGNOSIS — I132 Hypertensive heart and chronic kidney disease with heart failure and with stage 5 chronic kidney disease, or end stage renal disease: Secondary | ICD-10-CM | POA: Diagnosis not present

## 2016-09-29 NOTE — Telephone Encounter (Signed)
[facility] would like orders for social worker in order for patient to have community resources. Also requesting to continue PT twice for 4 weeks.Ok to give verbal order?[phone]

## 2016-09-30 DIAGNOSIS — D631 Anemia in chronic kidney disease: Secondary | ICD-10-CM | POA: Diagnosis not present

## 2016-09-30 DIAGNOSIS — Z992 Dependence on renal dialysis: Secondary | ICD-10-CM | POA: Diagnosis not present

## 2016-09-30 DIAGNOSIS — D509 Iron deficiency anemia, unspecified: Secondary | ICD-10-CM | POA: Diagnosis not present

## 2016-09-30 DIAGNOSIS — N186 End stage renal disease: Secondary | ICD-10-CM | POA: Diagnosis not present

## 2016-09-30 NOTE — Telephone Encounter (Signed)
Okay for verbal 

## 2016-09-30 NOTE — Telephone Encounter (Signed)
Verbal order given on vm

## 2016-10-01 DIAGNOSIS — S51812D Laceration without foreign body of left forearm, subsequent encounter: Secondary | ICD-10-CM | POA: Diagnosis not present

## 2016-10-01 DIAGNOSIS — I5022 Chronic systolic (congestive) heart failure: Secondary | ICD-10-CM | POA: Diagnosis not present

## 2016-10-01 DIAGNOSIS — S41112D Laceration without foreign body of left upper arm, subsequent encounter: Secondary | ICD-10-CM | POA: Diagnosis not present

## 2016-10-01 DIAGNOSIS — M1990 Unspecified osteoarthritis, unspecified site: Secondary | ICD-10-CM | POA: Diagnosis not present

## 2016-10-01 DIAGNOSIS — N186 End stage renal disease: Secondary | ICD-10-CM | POA: Diagnosis not present

## 2016-10-01 DIAGNOSIS — S72002D Fracture of unspecified part of neck of left femur, subsequent encounter for closed fracture with routine healing: Secondary | ICD-10-CM | POA: Diagnosis not present

## 2016-10-01 DIAGNOSIS — I132 Hypertensive heart and chronic kidney disease with heart failure and with stage 5 chronic kidney disease, or end stage renal disease: Secondary | ICD-10-CM | POA: Diagnosis not present

## 2016-10-01 DIAGNOSIS — M47896 Other spondylosis, lumbar region: Secondary | ICD-10-CM | POA: Diagnosis not present

## 2016-10-02 ENCOUNTER — Emergency Department
Admission: EM | Admit: 2016-10-02 | Discharge: 2016-10-02 | Disposition: A | Discharge status: Home / Self Care | Payer: Medicare Other | Attending: Emergency Medicine | Admitting: Emergency Medicine

## 2016-10-02 ENCOUNTER — Telehealth: Payer: Self-pay | Admitting: Family Medicine

## 2016-10-02 ENCOUNTER — Encounter: Payer: Self-pay | Admitting: Emergency Medicine

## 2016-10-02 DIAGNOSIS — I2510 Atherosclerotic heart disease of native coronary artery without angina pectoris: Secondary | ICD-10-CM | POA: Insufficient documentation

## 2016-10-02 DIAGNOSIS — Z79899 Other long term (current) drug therapy: Secondary | ICD-10-CM | POA: Diagnosis not present

## 2016-10-02 DIAGNOSIS — Z96652 Presence of left artificial knee joint: Secondary | ICD-10-CM | POA: Insufficient documentation

## 2016-10-02 DIAGNOSIS — I132 Hypertensive heart and chronic kidney disease with heart failure and with stage 5 chronic kidney disease, or end stage renal disease: Secondary | ICD-10-CM | POA: Diagnosis not present

## 2016-10-02 DIAGNOSIS — Z87891 Personal history of nicotine dependence: Secondary | ICD-10-CM | POA: Insufficient documentation

## 2016-10-02 DIAGNOSIS — Z992 Dependence on renal dialysis: Secondary | ICD-10-CM | POA: Diagnosis not present

## 2016-10-02 DIAGNOSIS — I120 Hypertensive chronic kidney disease with stage 5 chronic kidney disease or end stage renal disease: Secondary | ICD-10-CM | POA: Diagnosis not present

## 2016-10-02 DIAGNOSIS — R531 Weakness: Secondary | ICD-10-CM

## 2016-10-02 DIAGNOSIS — I5022 Chronic systolic (congestive) heart failure: Secondary | ICD-10-CM | POA: Insufficient documentation

## 2016-10-02 DIAGNOSIS — M6281 Muscle weakness (generalized): Secondary | ICD-10-CM | POA: Insufficient documentation

## 2016-10-02 DIAGNOSIS — I252 Old myocardial infarction: Secondary | ICD-10-CM | POA: Insufficient documentation

## 2016-10-02 DIAGNOSIS — N186 End stage renal disease: Secondary | ICD-10-CM | POA: Diagnosis not present

## 2016-10-02 LAB — CBC
HCT: 35.7 % (ref 35.0–47.0)
HEMOGLOBIN: 11.9 g/dL — AB (ref 12.0–16.0)
MCH: 28.7 pg (ref 26.0–34.0)
MCHC: 33.3 g/dL (ref 32.0–36.0)
MCV: 86 fL (ref 80.0–100.0)
Platelets: 311 10*3/uL (ref 150–440)
RBC: 4.15 MIL/uL (ref 3.80–5.20)
RDW: 16.5 % — ABNORMAL HIGH (ref 11.5–14.5)
WBC: 6.6 10*3/uL (ref 3.6–11.0)

## 2016-10-02 LAB — COMPREHENSIVE METABOLIC PANEL
ALK PHOS: 152 U/L — AB (ref 38–126)
ALT: 20 U/L (ref 14–54)
AST: 34 U/L (ref 15–41)
Albumin: 3.4 g/dL — ABNORMAL LOW (ref 3.5–5.0)
Anion gap: 12 (ref 5–15)
BUN: 25 mg/dL — AB (ref 6–20)
CALCIUM: 9.3 mg/dL (ref 8.9–10.3)
CO2: 27 mmol/L (ref 22–32)
CREATININE: 2.29 mg/dL — AB (ref 0.44–1.00)
Chloride: 102 mmol/L (ref 101–111)
GFR, EST AFRICAN AMERICAN: 21 mL/min — AB (ref >60)
GFR, EST NON AFRICAN AMERICAN: 18 mL/min — AB (ref >60)
Glucose, Bld: 101 mg/dL — ABNORMAL HIGH (ref 65–99)
Potassium: 4.1 mmol/L (ref 3.5–5.1)
SODIUM: 141 mmol/L (ref 135–145)
Total Bilirubin: 1.5 mg/dL — ABNORMAL HIGH (ref 0.3–1.2)
Total Protein: 6.8 g/dL (ref 6.5–8.1)

## 2016-10-02 NOTE — Progress Notes (Signed)
LCSW attempted to meet with patient and patients family to complete assessment . Her brother was not present and ED RN will call LCSW when he returns.  [employer] LCSW [phone]

## 2016-10-02 NOTE — NC FL2 (Signed)
[place] LEVEL OF CARE SCREENING TOOL     IDENTIFICATION  Patient [name] Birthdate: May 29, 1926 Sex: female Admission Date (Current Location): 10/02/2016  [place] and [place] Number:  Engineering geologist and Address:  [facility], [address], [place], [place] [address]      Provider Number: [id number]  Attending Physician Name and Address:  Lavonia Drafts, MD  Relative Name and Phone Number:       Current Level of Care: Hospital Recommended Level of Care: [place] Prior Approval Number:    Date Approved/Denied:   PASRR Number: [id number]  Discharge Plan: SNF    Current Diagnoses: Patient Active Problem List   Diagnosis Date Noted  . Vision changes 08/27/2016  . Hip fracture ([place]) 05/10/2016  . Falls 04/27/2016  . Left hand pain 04/27/2016  . Head injury 04/27/2016  . ESRD on dialysis ([place]) 04/10/2016  . Pressure injury of skin 12/15/2015  . Closed left subtrochanteric femur fracture ([place]) 12/15/2015  . Femur fracture, left ([place]) 12/14/2015  . Meniere disease   . Loss of weight 10/21/2015  . Clinical depression 06/20/2015  . Combined fat and carbohydrate induced hyperlipemia 06/20/2015  . Dysphagia 05/24/2015  . Dyspnea 05/24/2015  . Imbalance 04/22/2015  . Electrical shock sensation in right posterior head 11/22/2014  . Expressive aphasia 10/04/2014  . Breathlessness on exertion 09/13/2014  . Chronic pain syndrome 05/22/2014  . Insomnia 03/13/2014  . Anxiety state 03/13/2014  . Arteriosclerosis of coronary artery 01/15/2014  . Chronic systolic heart failure ([employer]) 01/15/2014  . Obstructive apnea 01/15/2014  . Basal cell carcinoma of neck 11/14/2013  . Degeneration of intervertebral disc of cervical region 11/07/2013  . Cervical radiculitis 10/16/2013  . Pelvic pain in female 09/12/2013  . Neck pain of over 3 months duration 09/12/2013  . Memory loss  09/12/2013  . Systolic heart failure, chronic ([place]) 05/12/2013  . Sinus node dysfunction ([place]) 08/01/2012  . Low back pain 08/01/2012  . Cervical spine pain 05/02/2012  . Irritable bowel syndrome 11/02/2011  . Depression 04/01/2011  . Hypertension 04/01/2011  . Menopausal disorder 04/01/2011    Orientation RESPIRATION BLADDER Height & Weight     Self, Time, Situation, Place  Normal Incontinent Weight: 104 lb (47.2 kg) Height:  5\' 4"  (162.6 cm)  BEHAVIORAL SYMPTOMS/MOOD NEUROLOGICAL BOWEL NUTRITION STATUS      Incontinent Diet (renal diet)  AMBULATORY STATUS COMMUNICATION OF NEEDS Skin   Supervision Verbally Bruising                       Personal Care Assistance Level of Assistance  Bathing, Feeding, Dressing, Total care Bathing Assistance: Limited assistance Feeding assistance: Limited assistance Dressing Assistance: Limited assistance Total Care Assistance: Limited assistance   Functional Limitations Info  Sight, Hearing, Speech Sight Info: Adequate Hearing Info: Impaired (Has hearing aid) Speech Info: Adequate    SPECIAL CARE FACTORS FREQUENCY  PT (By licensed PT), OT (By licensed OT)     PT Frequency: x5 OT Frequency: x5            Contractures Contractures Info: Not present    Additional Factors Info  Allergies   Allergies Info: Statins, effexor,coedine           Current Medications (10/02/2016):  This is the current hospital active medication list No current facility-administered medications for this encounter.    Current Outpatient Prescriptions  Medication Sig Dispense Refill  . acetaminophen (  TYLENOL) 325 MG tablet Take 2 tablets (650 mg total) by mouth every 4 (four) hours as needed for mild pain or moderate pain.    . diphenoxylate-atropine (LOMOTIL) 2.5-0.025 MG tablet TAKE 1 TABLET BY MOUTH FOUR TIMES DAILY AS NEEDED FOR DIARRHEA OR LOOSE STOOLS (Patient taking differently: TAKE 1 TABLET BY MOUTH every 6 hours AS NEEDED FOR DIARRHEA OR  LOOSE STOOLS) 30 tablet 5  . folic acid-vitamin b complex-vitamin c-selenium-zinc (DIALYVITE) 3 MG TABS tablet Take 1 tablet by mouth daily. (0900)    . levothyroxine (SYNTHROID, LEVOTHROID) 25 MCG tablet Take 1 tablet (25 mcg total) by mouth daily. (Patient taking differently: Take 25 mcg by mouth daily. (0800)) 90 tablet 1  . lidocaine-prilocaine (EMLA) cream Apply 1 application topically every Monday, Wednesday, and Friday with hemodialysis. (1100) apply to right arm dialysis access pre-dialysis wrap with saran wrap after applying.    . metoCLOPramide (REGLAN) 5 MG tablet Take 1 tablet (5 mg total) by mouth every 8 (eight) hours as needed (vertigo). 15 tablet 0  . metoprolol succinate (TOPROL XL) 25 MG 24 hr tablet TAKE 1/2 BY MOUTH DAILY 30 tablet 11  . mupirocin ointment (BACTROBAN) 2 % Apply 1 application topically 2 (two) times daily. 22 g 0  . sertraline (ZOLOFT) 50 MG tablet TAKE 3 TABLETS(150 MG) BY MOUTH DAILY 90 tablet 2  . traMADol (ULTRAM) 50 MG tablet Take 1 tablet (50 mg total) by mouth every 12 (twelve) hours as needed for severe pain. 10 tablet 0  . Vitamin D, Ergocalciferol, (DRISDOL) 50000 units CAPS capsule Take 50,000 Units by mouth every Monday. (0900)    . sulfamethoxazole-trimethoprim (BACTRIM DS,SEPTRA DS) 800-160 MG tablet Take 1 tablet by mouth 2 (two) times daily. (Patient not taking: Reported on 10/02/2016) 20 tablet 0     Discharge Medications: Please see discharge summary for a list of discharge medications.  Relevant Imaging Results:  Relevant Lab Results:   Additional Information [id number]  Joana Reamer, [place]

## 2016-10-02 NOTE — ED Notes (Signed)
Pt reports does not make much urine, dialysis pt. Md notified, no urine needed.

## 2016-10-02 NOTE — Evaluation (Deleted)
Physical Therapy Evaluation Patient Details [name] MRN: [id number] DOB: 1926-10-26 Today's Date: 10/02/2016   History of Present Illness  83 y/o female who has been in the ED multiple times recently.  In the last year she has had multiple falls with femur fractures/sx as well as a wrist fracture.  She went to rehab and then assisted living, she has been home with some HHPT/assist but generally has been unsafe and has limited mobiltiy.   Clinical Impression  Pt struggles with mobility secondary to L hip/thigh pain (X-rays negative) and needed significant assist just to get to/from supine.  She is very hard of hearing and generally frustrated with her multiple recent ED trips, and the awareness that she is not safe at home.  She was able to ambulate w/o overt LOBs, but generally was inconsistent and needed cuing (tactile more than verbal secondary to hearing issues).  Pt will need further PT intervention and short term rehab is the most appropriate and safe option for her at this time.     Follow Up Recommendations SNF    Equipment Recommendations       Recommendations for Other Services       Precautions / Restrictions Precautions Precautions: Fall Restrictions Weight Bearing Restrictions: No      Mobility  Bed Mobility Overal bed mobility: Needs Assistance Bed Mobility: Supine to Sit;Sit to Supine     Supine to sit: Mod assist;Max assist Sit to supine: Mod assist;Max assist   General bed mobility comments: Pt with severe pain during transitions.  Uncomortable with L hip WBing in sitting, impulsive with getting up/down from supine  Transfers Overall transfer level: Needs assistance Equipment used: Rolling walker (2 wheeled) Transfers: Sit to/from Stand Sit to Stand: Min assist         General transfer comment: Pt was able to rise to standing with light assist, she was reliant on the walker to maintain balance.  Ambulation/Gait Ambulation/Gait assistance:  Min assist Ambulation Distance (Feet): 35 Feet Assistive device: Rolling walker (2 wheeled)       General Gait Details: Pt is able to take a few steps.  Inconsistent with cadence, L LE toe out, able to take a few consistent steps but is unsure of herself and unsteady  Stairs            Wheelchair Mobility    Modified Rankin (Stroke Patients Only)       Balance Overall balance assessment: Needs assistance   Sitting balance-Leahy Scale: Fair Sitting balance - Comments: Pt leaning away from L hip secondary to pain, pt needing cues to stay focused and on task   Standing balance support: Bilateral upper extremity supported Standing balance-Leahy Scale: Fair Standing balance comment: Pt with inconsistent confidence/balance with standing - reliant on the walker                             Pertinent Vitals/Pain      Home Living Family/patient expects to be discharged to:: Skilled nursing facility                      Prior Function Level of Independence: Needs assistance         Comments: Pt has been living at home with intermittent assist, prior to that she was 7 months in ALF with PT.     Hand Dominance        Extremity/Trunk Assessment   Upper  Extremity Assessment Upper Extremity Assessment: Generalized weakness    Lower Extremity Assessment Lower Extremity Assessment: Generalized weakness       Communication   Communication: HOH  Cognition Arousal/Alertness: Awake/alert Behavior During Therapy: Agitated Overall Cognitive Status: Difficult to assess                                 General Comments: Pt admits to "ranting" and "unable to stop talking" appears to be upset about her situation in general       General Comments      Exercises     Assessment/Plan    PT Assessment Patient needs continued PT services  PT Problem List Decreased strength;Decreased range of motion;Decreased activity  tolerance;Decreased balance;Decreased mobility;Decreased coordination;Decreased cognition;Decreased knowledge of use of DME;Decreased safety awareness;Pain       PT Treatment Interventions DME instruction;Gait training;Functional mobility training;Therapeutic activities;Therapeutic exercise;Balance training;Cognitive remediation;Patient/family education    PT Goals (Current goals can be found in the Care Plan section)  Acute Rehab PT Goals Patient Stated Goal: Pt wants to go home but realizes she is not safe to do so PT Goal Formulation: With family Time For Goal Achievement: 10/16/16 Potential to Achieve Goals: Fair    Frequency Min 2X/week   Barriers to discharge        Co-evaluation               AM-PAC PT "6 Clicks" Daily Activity  Outcome Measure Difficulty turning over in bed (including adjusting bedclothes, sheets and blankets)?: Unable Difficulty moving from lying on back to sitting on the side of the bed? : Unable Difficulty sitting down on and standing up from a chair with arms (e.g., wheelchair, bedside commode, etc,.)?: Unable Help needed moving to and from a bed to chair (including a wheelchair)?: A Little Help needed walking in hospital room?: A Little Help needed climbing 3-5 steps with a railing? : A Lot 6 Click Score: 11    End of Session Equipment Utilized During Treatment: Gait belt Activity Tolerance: Patient limited by fatigue;Patient limited by pain Patient left: with bed alarm set;with call bell/phone within reach;with nursing/sitter in room;with family/visitor present   PT Visit Diagnosis: Muscle weakness (generalized) (M62.81);Difficulty in walking, not elsewhere classified (R26.2)    Time: 5732-2025 PT Time Calculation (min) (ACUTE ONLY): 29 min   Charges:   PT Evaluation $PT Eval Low Complexity: 1 Low     PT G Codes:        Kreg Shropshire, DPT 10/02/2016, 3:32 PM

## 2016-10-02 NOTE — Progress Notes (Signed)
LCSW spoke to covering ED RN that patient has been placed at [id number]. Call report [phone]  LCSW and pts brother requested a wheel chair and assistance to dress his sister and he will transport her to [facility].  LCSW spoke to Dr Corky Downs and he will DC patient LCSW awaiting PT report and will resend it via the [employer] at [facility] and she is awaiting patient and family member  [employer] LCSW [phone]

## 2016-10-02 NOTE — ED Notes (Signed)
Pt given food and drink.

## 2016-10-02 NOTE — ED Triage Notes (Signed)
Patient from home via [facility]. EMS reports that patient was found by her brother this morning 'slumped down' in her recliner. Brother reported to EMS alterations in mentation. However, Patient is A+O X4 upon arrival to [facility] ED as well as A+O X4 upon EMS arrival on scene.  Patient states she took a tramadol last night and may have hallucinated, however has since resolved.  Patient also states she "fell" last weekend, describes incident as "sliding down the wall" after becoming acutely dizzy  Patient has no complaints currently other than her "backpain that has been going on forever"

## 2016-10-02 NOTE — ED Provider Notes (Signed)
[facility] Emergency Department Provider Note   ____________________________________________    I have reviewed the triage vital signs and the nursing notes.   HISTORY  Chief Complaint Weakness     HPI Sharon [name] is a 83 y.o. female With a history of end-stage renal disease who presents with weakness. Brother reports that he found her slumped over in her chair today. However upon arrival to the emergency department she is interacting as per her usual self. Patient has been to the emergency department several times in the last 2 weeks for falls. No fall reported today. Brother reports that he would like the patient placed in a nursing home because he is unable to take care of her at this time. She had been in [facility] in the past   Past Medical History:  Diagnosis Date  . Arthritis   . CAD (coronary artery disease)   . Cancer ([place])    skin  . Cardiomyopathy ([place])   . Depression   . IBS (irritable bowel syndrome) 2010  . Kidney failure July 2012   Hemodialysis 3xweek  . Kidney failure   . Meniere disease   . Meniere's disease   . Myocardial infarction ([place])   . OSA (obstructive sleep apnea)    CPAP  . Peritoneal dialysis status ([place])   . Presence of permanent cardiac pacemaker     Patient Active Problem List   Diagnosis Date Noted  . Vision changes 08/27/2016  . Hip fracture ([place]) 05/10/2016  . Falls 04/27/2016  . Left hand pain 04/27/2016  . Head injury 04/27/2016  . ESRD on dialysis ([place]) 04/10/2016  . Pressure injury of skin 12/15/2015  . Closed left subtrochanteric femur fracture ([place]) 12/15/2015  . Femur fracture, left ([place]) 12/14/2015  . Meniere disease   . Loss of weight 10/21/2015  . Clinical depression 06/20/2015  . Combined fat and carbohydrate induced hyperlipemia 06/20/2015  . Dysphagia 05/24/2015  . Dyspnea 05/24/2015  . Imbalance 04/22/2015  . Electrical shock sensation in right posterior head 11/22/2014   . Expressive aphasia 10/04/2014  . Breathlessness on exertion 09/13/2014  . Chronic pain syndrome 05/22/2014  . Insomnia 03/13/2014  . Anxiety state 03/13/2014  . Arteriosclerosis of coronary artery 01/15/2014  . Chronic systolic heart failure ([place]) 01/15/2014  . Obstructive apnea 01/15/2014  . Basal cell carcinoma of neck 11/14/2013  . Degeneration of intervertebral disc of cervical region 11/07/2013  . Cervical radiculitis 10/16/2013  . Pelvic pain in female 09/12/2013  . Neck pain of over 3 months duration 09/12/2013  . Memory loss 09/12/2013  . Systolic heart failure, chronic ([place]) 05/12/2013  . Sinus node dysfunction ([place]) 08/01/2012  . Low back pain 08/01/2012  . Cervical spine pain 05/02/2012  . Irritable bowel syndrome 11/02/2011  . Depression 04/01/2011  . Hypertension 04/01/2011  . Menopausal disorder 04/01/2011    Past Surgical History:  Procedure Laterality Date  . [place]  . ANUS SURGERY  2010  . AV FISTULA PLACEMENT Right 04/15/2016   Procedure: ARTERIOVENOUS (AV) FISTULA CREATION ( RADIOCEPHALIC ) STAGE 2;  Surgeon: Algernon Huxley, MD;  Location: [facility] ORS;  Service: Vascular;  Laterality: Right;  . CATARACT EXTRACTION  2006, 2011  . CHOLECYSTECTOMY  01/2008  . DIALYSIS/PERMA CATHETER REMOVAL N/A 08/20/2016   Procedure: Dialysis/Perma Catheter Removal;  Surgeon: Algernon Huxley, MD;  Location: [place] [facility];  Service: Cardiovascular;  Laterality: N/A;  . EYE SURGERY  cataracts bilateral  . FEMUR IM NAIL Left 12/15/2015   Procedure: INTRAMEDULLARY (IM) RETROGRADE FEMORAL NAILING;  Surgeon: Oletta Cohn, DO;  Location: [facility] ORS;  Service: Orthopedics;  Laterality: Left;  . HIP PINNING,CANNULATED Left 05/10/2016   Procedure: CANNULATED HIP PINNING;  Surgeon: Earnestine Leys, MD;  Location: [facility] ORS;  Service: Orthopedics;  Laterality: Left;  . INSERTION OF DIALYSIS CATHETER  07/2010  . JOINT REPLACEMENT     left knee  . MINOR REMOVAL OF  PERITONEAL DIALYSIS CATHETER  04/15/2016   Procedure: MINOR REMOVAL OF PERITONEAL DIALYSIS CATHETER;  Surgeon: Algernon Huxley, MD;  Location: [facility] ORS;  Service: Vascular;;  . PACEMAKER INSERTION  2006  . PERIPHERAL VASCULAR CATHETERIZATION N/A 12/18/2015   Procedure: Dialysis/Perma Catheter Insertion;  Surgeon: Katha Cabal, MD;  Location: [place] [facility];  Service: Cardiovascular;  Laterality: N/A;  . TOTAL KNEE ARTHROPLASTY  2008   LEFT/Dr [place]    Prior to Admission medications   Medication Sig Start Date End Date Taking? Authorizing Provider  acetaminophen (TYLENOL) 325 MG tablet Take 2 tablets (650 mg total) by mouth every 4 (four) hours as needed for mild pain or moderate pain. 05/14/16  Yes Wieting, Richard, MD  diphenoxylate-atropine (LOMOTIL) 2.5-0.025 MG tablet TAKE 1 TABLET BY MOUTH FOUR TIMES DAILY AS NEEDED FOR DIARRHEA OR LOOSE STOOLS Patient taking differently: TAKE 1 TABLET BY MOUTH every 6 hours AS NEEDED FOR DIARRHEA OR LOOSE STOOLS 08/30/15  Yes [name], MD  folic acid-vitamin b complex-vitamin c-selenium-zinc (DIALYVITE) 3 MG TABS tablet Take 1 tablet by mouth daily. (0900)   Yes [provider]  levothyroxine (SYNTHROID, LEVOTHROID) 25 MCG tablet Take 1 tablet (25 mcg total) by mouth daily. Patient taking differently: Take 25 mcg by mouth daily. (0800) 10/25/15  Yes [name], MD  lidocaine-prilocaine (EMLA) cream Apply 1 application topically every Monday, Wednesday, and Friday with hemodialysis. (1100) apply to right arm dialysis access pre-dialysis wrap with saran wrap after applying.   Yes [provider]  metoCLOPramide (REGLAN) 5 MG tablet Take 1 tablet (5 mg total) by mouth every 8 (eight) hours as needed (vertigo). 05/30/16  Yes Menshew, Dannielle Karvonen, PA-C  metoprolol succinate (TOPROL XL) 25 MG 24 hr tablet TAKE 1/2 BY MOUTH DAILY 01/30/16  Yes [name], MD  mupirocin ointment (BACTROBAN) 2 % Apply 1 application topically 2  (two) times daily. 09/17/16  Yes Frederich Cha, MD  sertraline (ZOLOFT) 50 MG tablet TAKE 3 TABLETS(150 MG) BY MOUTH DAILY 08/30/15  Yes [name], MD  traMADol (ULTRAM) 50 MG tablet Take 1 tablet (50 mg total) by mouth every 12 (twelve) hours as needed for severe pain. 05/14/16  Yes Loletha Grayer, MD  Vitamin D, Ergocalciferol, (DRISDOL) 50000 units CAPS capsule Take 50,000 Units by mouth every Monday. (0900) 04/17/15  Yes [provider]  sulfamethoxazole-trimethoprim (BACTRIM DS,SEPTRA DS) 800-160 MG tablet Take 1 tablet by mouth 2 (two) times daily. Patient not taking: Reported on 10/02/2016 09/17/16   Frederich Cha, MD     Allergies Statins; Effexor [venlafaxine]; and Codeine  Family History  Problem Relation Age of Onset  . Cancer Mother        ? ovarian - sarcoma  . Cancer Father        Skin cancer  . Diabetes Sister   . COPD Sister   . Depression Sister   . Cancer Sister        Lung - 60 yrs old    Social  History Social History  Substance Use Topics  . Smoking status: Former Smoker    Quit date: 03/31/1948  . Smokeless tobacco: Never Used  . Alcohol use No    Level V caveat: Review of Systems Limited by dementia  Constitutional: denies fever Eyes: No visual changes.  ENT: No sore throat. Cardiovascular: Denies chest pain. Respiratory: Denies shortness of breath. Gastrointestinal: No nausea, no vomiting.   Genitourinary: Negative for dysuria. Musculoskeletal: Negative for back pain. Skin: Negative for rash. Neurological: Negative for headaches    ____________________________________________   PHYSICAL EXAM:  VITAL SIGNS: ED Triage Vitals  Enc Vitals Group     BP 10/02/16 1200 (!) 169/87     Pulse Rate 10/02/16 1200 89     Resp 10/02/16 1200 (!) 21     Temp --      Temp src --      SpO2 10/02/16 1200 96 %     Weight 10/02/16 1015 47.2 kg (104 lb)     Height 10/02/16 1015 1.626 m (5\' 4" )     Head Circumference --      Peak Flow --       Pain Score 10/02/16 1015 5     Pain Loc --      Pain Edu? --      Excl. in [place]? --     Constitutional: Alert.No acute distress.  Eyes: Conjunctivae are normal.   Nose: No congestion/rhinnorhea. Mouth/Throat: Mucous membranes are moist.   Neck:  Painless ROM Cardiovascular: Normal rate, regular rhythm. Grossly normal heart sounds.  Good peripheral circulation. Respiratory: Normal respiratory effort.  No retractions. Lungs CTAB. Gastrointestinal: Soft and nontender. No distention.  No CVA tenderness. Genitourinary: deferred Musculoskeletal: No lower extremity tenderness nor edema.  Warm and well perfused Neurologic:  Normal speech and language. No gross focal neurologic deficits are appreciated.  Skin:  Skin is warm, dry and intact. No rash noted. Psychiatric: Mood and affect are normal. Speech and behavior are normal.  ____________________________________________   LABS (all labs ordered are listed, but only abnormal results are displayed)  Labs Reviewed  CBC - Abnormal; Notable for the following:       Result Value   Hemoglobin 11.9 (*)    RDW 16.5 (*)    All other components within normal limits  COMPREHENSIVE METABOLIC PANEL - Abnormal; Notable for the following:    Glucose, Bld 101 (*)    BUN 25 (*)    Creatinine, Ser 2.29 (*)    Albumin 3.4 (*)    Alkaline Phosphatase 152 (*)    Total Bilirubin 1.5 (*)    GFR calc non Af Amer 18 (*)    GFR calc Af Amer 21 (*)    All other components within normal limits   ____________________________________________  EKG  None ____________________________________________  RADIOLOGY  None ____________________________________________   PROCEDURES  Procedure(s) performed: No    Critical Care performed: No ____________________________________________   INITIAL IMPRESSION / ASSESSMENT AND PLAN / ED COURSE  Pertinent labs & imaging results that were available during my care of the patient were reviewed by me and  considered in my medical decision making (see chart for details).  Patient here for evaluation for weakness. Lab work is not significant change from prior. Exam is reassuring. Son has requested social work evaluation, this has been done and [facility] has accepted the patient    ____________________________________________   FINAL CLINICAL IMPRESSION(S) / ED DIAGNOSES  Final diagnoses:  Generalized weakness  ESRD (end stage  renal disease) ([place])      NEW MEDICATIONS STARTED DURING THIS VISIT:  New Prescriptions   No medications on file     Note:  This document was prepared using Dragon voice recognition software and may include unintentional dictation errors.    Lavonia Drafts, MD 10/02/16 1520

## 2016-10-02 NOTE — ED Notes (Signed)
Patient is M,W,F Dialysis

## 2016-10-02 NOTE — Telephone Encounter (Signed)
I attempted to reach Ed, the patients brother, he was on his way to the patients house as she has fallen again today.  Wife gave information on current scenario, no medical information was given to her via phone.  Actively listened to her concerns.  Stated that husband is overwhelm with the increased care needs for his sister.  They already have home health, Physical therapy and neighbors consistently in the home.  Patient is unable to care for herself, on average is either in the chair all day or has falled int he past week and has called to get assistance getting up daily.  Brother has taken her to [facility] and the ED numerous times and fells that she need to be in a safer scenario.  Per the wife he spoke with the program [facility] yesterday and they declined her as a patient due to the severity of the situation, he has a call out to [facility] to see if he can place her there as that is were she was post hip replacement.  I did explain that forms would be needed and that her PCP is not in the office today and would return on Monday.  She understood, she was going to pass the information on to her husband and have him return a call to me. thanks

## 2016-10-02 NOTE — ED Notes (Signed)
Ed little- pt poa- [phone]

## 2016-10-02 NOTE — Evaluation (Signed)
Physical Therapy Evaluation Patient Details [name] MRN: [id number] DOB: [birth year] Today's Date: 10/02/2016   History of Present Illness  83 y/o female who has been in the ED multiple times recently.  In the last year she has had multiple falls with femur fractures/sx as well as a wrist fracture.  She went to rehab and then assisted living, she has been home with some HHPT/assist but generally has been unsafe and has limited mobiltiy.   Clinical Impression  Pt struggles with mobility secondary to L hip/thigh pain (X-rays negative) and needed significant assist just to get to/from supine.  She is very hard of hearing and generally frustrated with her multiple recent ED trips, and the awareness that she is not safe at home.  She was able to ambulate w/o overt LOBs, but generally was inconsistent and needed cuing (tactile more than verbal secondary to hearing issues).  Pt will need further PT intervention and short term rehab is the most appropriate and safe option for her at this time.    Follow Up Recommendations SNF    Equipment Recommendations       Recommendations for Other Services       Precautions / Restrictions Precautions Precautions: Fall Restrictions Weight Bearing Restrictions: No      Mobility  Bed Mobility Overal bed mobility: Needs Assistance Bed Mobility: Supine to Sit;Sit to Supine     Supine to sit: Mod assist;Max assist Sit to supine: Mod assist;Max assist   General bed mobility comments: Pt with severe pain during transitions.  Uncomortable with L hip WBing in sitting, impulsive with getting up/down from supine  Transfers Overall transfer level: Needs assistance Equipment used: Rolling walker (2 wheeled) Transfers: Sit to/from Stand Sit to Stand: Min assist         General transfer comment: Pt was able to rise to standing with light assist, she was reliant on the walker to maintain balance.  Ambulation/Gait Ambulation/Gait assistance:  Min assist Ambulation Distance (Feet): 35 Feet Assistive device: Rolling walker (2 wheeled)       General Gait Details: Pt is able to take a few steps.  Inconsistent with cadence, L LE toe out, able to take a few consistent steps but is unsure of herself and unsteady  Stairs            Wheelchair Mobility    Modified Rankin (Stroke Patients Only)       Balance Overall balance assessment: Needs assistance   Sitting balance-Leahy Scale: Fair Sitting balance - Comments: Pt leaning away from L hip secondary to pain, pt needing cues to stay focused and on task   Standing balance support: Bilateral upper extremity supported Standing balance-Leahy Scale: Fair Standing balance comment: Pt with inconsistent confidence/balance with standing - reliant on the walker                             Pertinent Vitals/Pain      Home Living Family/patient expects to be discharged to:: Skilled nursing facility                      Prior Function Level of Independence: Needs assistance         Comments: Pt has been living at home with intermittent assist, prior to that she was 7 months in ALF with PT.     Hand Dominance        Extremity/Trunk Assessment   Upper Extremity  Assessment Upper Extremity Assessment: Generalized weakness    Lower Extremity Assessment Lower Extremity Assessment: Generalized weakness       Communication   Communication: HOH  Cognition Arousal/Alertness: Awake/alert Behavior During Therapy: Agitated Overall Cognitive Status: Difficult to assess                                 General Comments: Pt admits to "ranting" and "unable to stop talking" appears to be upset about her situation in general       General Comments      Exercises     Assessment/Plan    PT Assessment Patient needs continued PT services  PT Problem List Decreased strength;Decreased range of motion;Decreased activity  tolerance;Decreased balance;Decreased mobility;Decreased coordination;Decreased cognition;Decreased knowledge of use of DME;Decreased safety awareness;Pain       PT Treatment Interventions DME instruction;Gait training;Functional mobility training;Therapeutic activities;Therapeutic exercise;Balance training;Cognitive remediation;Patient/family education    PT Goals (Current goals can be found in the Care Plan section)  Acute Rehab PT Goals Patient Stated Goal: Pt wants to go home but realizes she is not safe to do so PT Goal Formulation: With family Time For Goal Achievement: 10/16/16 Potential to Achieve Goals: Fair    Frequency Min 2X/week   Barriers to discharge        Co-evaluation               AM-PAC PT "6 Clicks" Daily Activity  Outcome Measure Difficulty turning over in bed (including adjusting bedclothes, sheets and blankets)?: Unable Difficulty moving from lying on back to sitting on the side of the bed? : Unable Difficulty sitting down on and standing up from a chair with arms (e.g., wheelchair, bedside commode, etc,.)?: Unable Help needed moving to and from a bed to chair (including a wheelchair)?: A Little Help needed walking in hospital room?: A Little Help needed climbing 3-5 steps with a railing? : A Lot 6 Click Score: 11    End of Session Equipment Utilized During Treatment: Gait belt Activity Tolerance: Patient limited by fatigue;Patient limited by pain Patient left: with bed alarm set;with call bell/phone within reach;with nursing/sitter in room;with family/visitor present   PT Visit Diagnosis: Muscle weakness (generalized) (M62.81);Difficulty in walking, not elsewhere classified (R26.2)    Time: 7782-4235 PT Time Calculation (min) (ACUTE ONLY): 29 min   Charges:   PT Evaluation $PT Eval Low Complexity: 1 Low     PT G Codes:   PT G-Codes **NOT FOR INPATIENT CLASS** Functional Assessment Tool Used: AM-PAC 6 Clicks Basic Mobility Functional  Limitation: Mobility: Walking and moving around Mobility: Walking and Moving Around Current Status (T6144): At least 60 percent but less than 80 percent impaired, limited or restricted Mobility: Walking and Moving Around Goal Status [phone]): At least 20 percent but less than 40 percent impaired, limited or restricted    Kreg Shropshire, DPT 10/02/2016, 3:39 PM

## 2016-10-02 NOTE — Telephone Encounter (Signed)
Pt brother called and stated pt keeps falling and unable to get out. Brother has taken her to the ER and [facility] several times this week along with calling EMS. He states that she is delirious and is seeing people who have been gone a long time. Brother is not sure what to do next. He really feels that she needs to a nursing home and assisted living. They are really afraid that she is going to die in the house by herself, they don't think that she is taking her medication as instructed or even feeding herself. Can they just go ahead and admit her to [facility]? Please advise, thank you!  Call Ed @ [phone]

## 2016-10-02 NOTE — Clinical Social Work Note (Signed)
Clinical Social Work Assessment  Patient Details  [name] MRN: [id number] Date of Birth: Jan 21, 1927  Date of referral:  10/02/16               Reason for consult:  Facility Placement                Permission sought to share information with:  Family Supports, Customer service manager Permission granted to share information::  Yes, Verbal Permission Granted  Name::     [facility] [place] [phone]  Agency::  [facility]  Relationship::     Contact Information:     Housing/Transportation Living arrangements for the past 2 months:  [place] of Information:  Patient, Power of Attorney Patient Interpreter Needed:  None Criminal Activity/Legal Involvement Pertinent to Current Situation/Hospitalization:  No - Comment as needed Significant Relationships:  Siblings Lives with:  Facility Resident Do you feel safe going back to the place where you live?  No Need for family participation in patient care:  Yes (Comment)  Care giving concerns: Brother reports he is unable to provide the required care for his sister in home any longer. She has fallen several times in the last 2 weeks   Social Worker assessment / plan:  LCSW met with patient and Sharon [name] pts brother, he gave verbal consent to speak to his sister and assisted in explaining current level of functioning  And he frequent falls. PT evaluated patient and stated it is unsafe for patient to return home. He recommends SNF/ STR or AL facility. Patient is able to use walker but weaken. She is incontinent and needs assistance with her ADL's. She is attached to [facility] dialysis clinic and goes on MWF in [place]. Patient has good family involvement however they are unable to manage her needs at this time. Patient is on a renal diet. Her insurance is Medicare/AARP supplemental. Family contacted Magda Paganini) at [facility] and they have accepted patient Room # 503 Call report number [phone].    Employment status:  Retired Forensic scientist:  Medicare (AARP/UHC supplimental) PT Recommendations:  [place] (STR recommended) Information / Referral to community resources:  Acute Rehab  Patient/Family's Response to care:  Get my sister to a safe place Medical laboratory scientific officer Pay)  Patient/Family's Understanding of and Emotional Response to Diagnosis, Current Treatment, and Prognosis:  Pt is agreeable to return to [facility] Assessment Appearance:  Appears stated age Attitude/Demeanor/Rapport:   (Polite and excitable) Affect (typically observed):  Accepting, Adaptable, Calm Orientation:  Oriented to Self, Oriented to Place, Oriented to  Time, Oriented to Situation Alcohol / Substance use:  Not Applicable Psych involvement (Current and /or in the community):  No (Comment), Yes (Comment)  Discharge Needs  Concerns to be addressed:  No discharge needs identified Readmission within the last 30 days:  No Current discharge risk:  Dependent with Mobility Barriers to Discharge:  No Barriers Identified   Sharon Reamer, LCSW 10/02/2016, 2:43 PM

## 2016-10-05 ENCOUNTER — Other Ambulatory Visit: Payer: Self-pay | Admitting: *Deleted

## 2016-10-05 DIAGNOSIS — E441 Mild protein-calorie malnutrition: Secondary | ICD-10-CM | POA: Diagnosis not present

## 2016-10-05 DIAGNOSIS — Z992 Dependence on renal dialysis: Secondary | ICD-10-CM | POA: Diagnosis not present

## 2016-10-05 DIAGNOSIS — I5022 Chronic systolic (congestive) heart failure: Secondary | ICD-10-CM | POA: Diagnosis not present

## 2016-10-05 DIAGNOSIS — W19XXXD Unspecified fall, subsequent encounter: Secondary | ICD-10-CM | POA: Diagnosis not present

## 2016-10-05 DIAGNOSIS — D509 Iron deficiency anemia, unspecified: Secondary | ICD-10-CM | POA: Diagnosis not present

## 2016-10-05 DIAGNOSIS — N186 End stage renal disease: Secondary | ICD-10-CM | POA: Diagnosis not present

## 2016-10-05 DIAGNOSIS — D631 Anemia in chronic kidney disease: Secondary | ICD-10-CM | POA: Diagnosis not present

## 2016-10-05 DIAGNOSIS — R262 Difficulty in walking, not elsewhere classified: Secondary | ICD-10-CM | POA: Diagnosis not present

## 2016-10-05 DIAGNOSIS — R296 Repeated falls: Secondary | ICD-10-CM | POA: Diagnosis not present

## 2016-10-05 DIAGNOSIS — N185 Chronic kidney disease, stage 5: Secondary | ICD-10-CM | POA: Diagnosis not present

## 2016-10-05 DIAGNOSIS — R413 Other amnesia: Secondary | ICD-10-CM | POA: Diagnosis not present

## 2016-10-05 DIAGNOSIS — Y92009 Unspecified place in unspecified non-institutional (private) residence as the place of occurrence of the external cause: Secondary | ICD-10-CM | POA: Diagnosis not present

## 2016-10-05 DIAGNOSIS — M6281 Muscle weakness (generalized): Secondary | ICD-10-CM | POA: Diagnosis not present

## 2016-10-05 NOTE — Patient Outreach (Signed)
[facility]) Care Management  10/05/2016  KATILYN MILTENBERGER 03/14/1926 [phone]   RN Health Coach attempted #1 outreach call  to patient.  Patient was unavailable. HIPPA compliance voicemail message left with return callback number.  Plan: RN will call patient again within 14 days.  [facility] Care Management [phone]

## 2016-10-05 NOTE — Telephone Encounter (Signed)
I think this would be a good idea. I am happy to fill out the forms that they need. We need to make sure that the patients brother can make this decision for her prior to filling the forms out i.e. Does he have POA and has been designated her guardian? Thanks.

## 2016-10-06 ENCOUNTER — Ambulatory Visit: Payer: Medicare Other | Admitting: Family Medicine

## 2016-10-06 DIAGNOSIS — M6281 Muscle weakness (generalized): Secondary | ICD-10-CM | POA: Diagnosis not present

## 2016-10-06 DIAGNOSIS — S72002D Fracture of unspecified part of neck of left femur, subsequent encounter for closed fracture with routine healing: Secondary | ICD-10-CM | POA: Diagnosis not present

## 2016-10-06 DIAGNOSIS — M47896 Other spondylosis, lumbar region: Secondary | ICD-10-CM | POA: Diagnosis not present

## 2016-10-06 DIAGNOSIS — R262 Difficulty in walking, not elsewhere classified: Secondary | ICD-10-CM | POA: Diagnosis not present

## 2016-10-06 NOTE — Telephone Encounter (Signed)
POA forms are in the chart, he has the ability, the forms are in the media tab.  I can call and get an update if you like?

## 2016-10-06 NOTE — Telephone Encounter (Signed)
Noted  

## 2016-10-06 NOTE — Telephone Encounter (Signed)
Spoke with Percell [facility]) on the phone. He went on Friday to check on her as she had fallen again and took her the the ED.  They were able to have her directly admitted to the rehab unit at [facility] at a high cost. They had a PT evaluation in the ED, that confirmed that she was unsafe at home.  She really needs 24 hour, but they were already utilizing [facility] for nursing/PT at home and she didn't want more then that.  They didn't have a rationalization to keep her in the hospital for the medicare 3 day evaluation, so [facility].  His concern is that she is going to run out of funds in the next ten days or so at the cost of the current room there.  He is going to try and meet with Delton Coombes the social worker at [facility] today when he goes there to see if her can get her set up with medicaid or possibly VA benefits.  She has a upcoming appt this morning with Dr. Sabra Heck at [facility] for a follow up on her Hip post surgery and then has the CT abd/Pelvis on Thursday.  She is still doing the Dialysis on MWF and seems to be doing well at the facility.  I told him that if he needs assistance with anything to let us know and that he will otherwise see you on the 20th for her appt here.

## 2016-10-06 NOTE — Telephone Encounter (Signed)
Can you call and get an update? It appears she might have been admitted to SNF through the ED last week. Thanks.

## 2016-10-07 ENCOUNTER — Ambulatory Visit: Payer: Self-pay | Admitting: *Deleted

## 2016-10-07 DIAGNOSIS — R262 Difficulty in walking, not elsewhere classified: Secondary | ICD-10-CM | POA: Diagnosis not present

## 2016-10-07 DIAGNOSIS — N186 End stage renal disease: Secondary | ICD-10-CM | POA: Diagnosis not present

## 2016-10-07 DIAGNOSIS — D631 Anemia in chronic kidney disease: Secondary | ICD-10-CM | POA: Diagnosis not present

## 2016-10-07 DIAGNOSIS — D509 Iron deficiency anemia, unspecified: Secondary | ICD-10-CM | POA: Diagnosis not present

## 2016-10-07 DIAGNOSIS — Z992 Dependence on renal dialysis: Secondary | ICD-10-CM | POA: Diagnosis not present

## 2016-10-07 DIAGNOSIS — M6281 Muscle weakness (generalized): Secondary | ICD-10-CM | POA: Diagnosis not present

## 2016-10-08 ENCOUNTER — Ambulatory Visit
Admission: RE | Admit: 2016-10-08 | Discharge: 2016-10-08 | Disposition: A | Discharge status: Home / Self Care | Payer: Medicare Other | Source: Ambulatory Visit | Attending: Family Medicine | Admitting: Family Medicine

## 2016-10-08 DIAGNOSIS — S72111A Displaced fracture of greater trochanter of right femur, initial encounter for closed fracture: Secondary | ICD-10-CM | POA: Diagnosis not present

## 2016-10-08 DIAGNOSIS — K7460 Unspecified cirrhosis of liver: Secondary | ICD-10-CM | POA: Insufficient documentation

## 2016-10-08 DIAGNOSIS — K5710 Diverticulosis of small intestine without perforation or abscess without bleeding: Secondary | ICD-10-CM | POA: Insufficient documentation

## 2016-10-08 DIAGNOSIS — R262 Difficulty in walking, not elsewhere classified: Secondary | ICD-10-CM | POA: Diagnosis not present

## 2016-10-08 DIAGNOSIS — N281 Cyst of kidney, acquired: Secondary | ICD-10-CM | POA: Insufficient documentation

## 2016-10-08 DIAGNOSIS — R188 Other ascites: Secondary | ICD-10-CM | POA: Diagnosis not present

## 2016-10-08 DIAGNOSIS — Z90710 Acquired absence of both cervix and uterus: Secondary | ICD-10-CM | POA: Insufficient documentation

## 2016-10-08 DIAGNOSIS — M6281 Muscle weakness (generalized): Secondary | ICD-10-CM | POA: Diagnosis not present

## 2016-10-08 DIAGNOSIS — R634 Abnormal weight loss: Secondary | ICD-10-CM | POA: Diagnosis not present

## 2016-10-08 DIAGNOSIS — Z9049 Acquired absence of other specified parts of digestive tract: Secondary | ICD-10-CM | POA: Diagnosis not present

## 2016-10-08 DIAGNOSIS — I700 Atherosclerosis of aorta: Secondary | ICD-10-CM | POA: Insufficient documentation

## 2016-10-08 DIAGNOSIS — X58XXXA Exposure to other specified factors, initial encounter: Secondary | ICD-10-CM | POA: Insufficient documentation

## 2016-10-09 ENCOUNTER — Other Ambulatory Visit: Payer: Self-pay | Admitting: Family Medicine

## 2016-10-09 DIAGNOSIS — R188 Other ascites: Principal | ICD-10-CM

## 2016-10-09 DIAGNOSIS — R262 Difficulty in walking, not elsewhere classified: Secondary | ICD-10-CM | POA: Diagnosis not present

## 2016-10-09 DIAGNOSIS — K7460 Unspecified cirrhosis of liver: Secondary | ICD-10-CM

## 2016-10-09 DIAGNOSIS — M6281 Muscle weakness (generalized): Secondary | ICD-10-CM | POA: Diagnosis not present

## 2016-10-10 DIAGNOSIS — Z992 Dependence on renal dialysis: Secondary | ICD-10-CM | POA: Diagnosis not present

## 2016-10-10 DIAGNOSIS — N186 End stage renal disease: Secondary | ICD-10-CM | POA: Diagnosis not present

## 2016-10-10 DIAGNOSIS — D631 Anemia in chronic kidney disease: Secondary | ICD-10-CM | POA: Diagnosis not present

## 2016-10-10 DIAGNOSIS — D509 Iron deficiency anemia, unspecified: Secondary | ICD-10-CM | POA: Diagnosis not present

## 2016-10-11 ENCOUNTER — Emergency Department: Payer: Medicare Other

## 2016-10-11 ENCOUNTER — Emergency Department
Admission: EM | Admit: 2016-10-11 | Discharge: 2016-10-11 | Disposition: A | Discharge status: Home / Self Care | Payer: Medicare Other | Attending: Emergency Medicine | Admitting: Emergency Medicine

## 2016-10-11 ENCOUNTER — Encounter: Payer: Self-pay | Admitting: Emergency Medicine

## 2016-10-11 DIAGNOSIS — W19XXXA Unspecified fall, initial encounter: Secondary | ICD-10-CM | POA: Diagnosis not present

## 2016-10-11 DIAGNOSIS — I5022 Chronic systolic (congestive) heart failure: Secondary | ICD-10-CM | POA: Diagnosis not present

## 2016-10-11 DIAGNOSIS — I2510 Atherosclerotic heart disease of native coronary artery without angina pectoris: Secondary | ICD-10-CM | POA: Diagnosis not present

## 2016-10-11 DIAGNOSIS — Y999 Unspecified external cause status: Secondary | ICD-10-CM | POA: Diagnosis not present

## 2016-10-11 DIAGNOSIS — R51 Headache: Secondary | ICD-10-CM | POA: Diagnosis not present

## 2016-10-11 DIAGNOSIS — Y9389 Activity, other specified: Secondary | ICD-10-CM | POA: Diagnosis not present

## 2016-10-11 DIAGNOSIS — Z743 Need for continuous supervision: Secondary | ICD-10-CM | POA: Diagnosis not present

## 2016-10-11 DIAGNOSIS — Y92129 Unspecified place in nursing home as the place of occurrence of the external cause: Secondary | ICD-10-CM | POA: Diagnosis not present

## 2016-10-11 DIAGNOSIS — S0181XA Laceration without foreign body of other part of head, initial encounter: Secondary | ICD-10-CM

## 2016-10-11 DIAGNOSIS — J90 Pleural effusion, not elsewhere classified: Secondary | ICD-10-CM | POA: Insufficient documentation

## 2016-10-11 DIAGNOSIS — M25561 Pain in right knee: Secondary | ICD-10-CM | POA: Diagnosis not present

## 2016-10-11 DIAGNOSIS — W010XXA Fall on same level from slipping, tripping and stumbling without subsequent striking against object, initial encounter: Secondary | ICD-10-CM | POA: Insufficient documentation

## 2016-10-11 DIAGNOSIS — Z85828 Personal history of other malignant neoplasm of skin: Secondary | ICD-10-CM | POA: Insufficient documentation

## 2016-10-11 DIAGNOSIS — R0602 Shortness of breath: Secondary | ICD-10-CM | POA: Diagnosis not present

## 2016-10-11 DIAGNOSIS — I132 Hypertensive heart and chronic kidney disease with heart failure and with stage 5 chronic kidney disease, or end stage renal disease: Secondary | ICD-10-CM | POA: Diagnosis not present

## 2016-10-11 DIAGNOSIS — S0990XA Unspecified injury of head, initial encounter: Secondary | ICD-10-CM

## 2016-10-11 DIAGNOSIS — Z992 Dependence on renal dialysis: Secondary | ICD-10-CM | POA: Insufficient documentation

## 2016-10-11 DIAGNOSIS — Z87891 Personal history of nicotine dependence: Secondary | ICD-10-CM | POA: Diagnosis not present

## 2016-10-11 DIAGNOSIS — M542 Cervicalgia: Secondary | ICD-10-CM | POA: Diagnosis not present

## 2016-10-11 DIAGNOSIS — R296 Repeated falls: Secondary | ICD-10-CM | POA: Diagnosis not present

## 2016-10-11 DIAGNOSIS — Z9181 History of falling: Secondary | ICD-10-CM | POA: Diagnosis not present

## 2016-10-11 DIAGNOSIS — N186 End stage renal disease: Secondary | ICD-10-CM | POA: Diagnosis not present

## 2016-10-11 DIAGNOSIS — S199XXA Unspecified injury of neck, initial encounter: Secondary | ICD-10-CM | POA: Diagnosis not present

## 2016-10-11 LAB — BASIC METABOLIC PANEL
Anion gap: 10 (ref 5–15)
BUN: 24 mg/dL — AB (ref 6–20)
CHLORIDE: 97 mmol/L — AB (ref 101–111)
CO2: 30 mmol/L (ref 22–32)
Calcium: 9.4 mg/dL (ref 8.9–10.3)
Creatinine, Ser: 2.48 mg/dL — ABNORMAL HIGH (ref 0.44–1.00)
GFR calc non Af Amer: 16 mL/min — ABNORMAL LOW (ref >60)
GFR, EST AFRICAN AMERICAN: 19 mL/min — AB (ref >60)
Glucose, Bld: 114 mg/dL — ABNORMAL HIGH (ref 65–99)
POTASSIUM: 3.2 mmol/L — AB (ref 3.5–5.1)
SODIUM: 137 mmol/L (ref 135–145)

## 2016-10-11 LAB — CBC
HCT: 36.8 % (ref 35.0–47.0)
HEMOGLOBIN: 12.1 g/dL (ref 12.0–16.0)
MCH: 28.6 pg (ref 26.0–34.0)
MCHC: 33 g/dL (ref 32.0–36.0)
MCV: 86.5 fL (ref 80.0–100.0)
Platelets: 288 10*3/uL (ref 150–440)
RBC: 4.25 MIL/uL (ref 3.80–5.20)
RDW: 17.7 % — ABNORMAL HIGH (ref 11.5–14.5)
WBC: 6 10*3/uL (ref 3.6–11.0)

## 2016-10-11 MED ORDER — LIDOCAINE HCL (PF) 1 % IJ SOLN
5.0000 mL | Freq: Once | INTRAMUSCULAR | Status: AC
  Administered 2016-10-11: 5 mL

## 2016-10-11 MED ORDER — LIDOCAINE HCL (PF) 1 % IJ SOLN
INTRAMUSCULAR | Status: AC
  Administered 2016-10-11: 5 mL
  Filled 2016-10-11: qty 5

## 2016-10-11 NOTE — Discharge Instructions (Signed)
Patient noted to have Pleural effusion on CXR. No hypoxia or evidence of infection. Please alert Dr. Josephina Gip to this finding

## 2016-10-11 NOTE — ED Provider Notes (Signed)
[facility] Emergency Department Provider Note   ____________________________________________    I have reviewed the triage vital signs and the nursing notes.   HISTORY  Chief Complaint Fall and Laceration     HPI Sharon [name] is a 83 y.o. female who presents after a fall. Patient presents from nursing home after a fall while pushing her wheelchair. Apparently she fell forward and struck her right knee and her forehead on the floor. She complains of mild posterior neck pain although no pain with range of motion. She did suffer a laceration to her forehead. She complains of mild left finger pain, index as well as "a bruise to her right knee. Full range of motion of all extremities. No dizziness or chest pain, this appears to be a mechanical fall. I have seen her numerous times for similar episodes   Past Medical History:  Diagnosis Date  . Arthritis   . CAD (coronary artery disease)   . Cancer ([place])    skin  . Cardiomyopathy ([place])   . Depression   . IBS (irritable bowel syndrome) 2010  . Kidney failure July 2012   Hemodialysis 3xweek  . Kidney failure   . Meniere disease   . Meniere's disease   . Myocardial infarction ([place])   . OSA (obstructive sleep apnea)    CPAP  . Peritoneal dialysis status ([place])   . Presence of permanent cardiac pacemaker     Patient Active Problem List   Diagnosis Date Noted  . Vision changes 08/27/2016  . Hip fracture ([place]) 05/10/2016  . Falls 04/27/2016  . Left hand pain 04/27/2016  . Head injury 04/27/2016  . ESRD on dialysis ([place]) 04/10/2016  . Pressure injury of skin 12/15/2015  . Closed left subtrochanteric femur fracture ([place]) 12/15/2015  . Femur fracture, left ([place]) 12/14/2015  . Meniere disease   . Loss of weight 10/21/2015  . Clinical depression 06/20/2015  . Combined fat and carbohydrate induced hyperlipemia 06/20/2015  . Dysphagia 05/24/2015  . Dyspnea 05/24/2015  . Imbalance 04/22/2015  .  Electrical shock sensation in right posterior head 11/22/2014  . Expressive aphasia 10/04/2014  . Breathlessness on exertion 09/13/2014  . Chronic pain syndrome 05/22/2014  . Insomnia 03/13/2014  . Anxiety state 03/13/2014  . Arteriosclerosis of coronary artery 01/15/2014  . Chronic systolic heart failure ([place]) 01/15/2014  . Obstructive apnea 01/15/2014  . Basal cell carcinoma of neck 11/14/2013  . Degeneration of intervertebral disc of cervical region 11/07/2013  . Cervical radiculitis 10/16/2013  . Pelvic pain in female 09/12/2013  . Neck pain of over 3 months duration 09/12/2013  . Memory loss 09/12/2013  . Systolic heart failure, chronic ([place]) 05/12/2013  . Sinus node dysfunction ([place]) 08/01/2012  . Low back pain 08/01/2012  . Cervical spine pain 05/02/2012  . Irritable bowel syndrome 11/02/2011  . Depression 04/01/2011  . Hypertension 04/01/2011  . Menopausal disorder 04/01/2011    Past Surgical History:  Procedure Laterality Date  . [place]  . ANUS SURGERY  2010  . AV FISTULA PLACEMENT Right 04/15/2016   Procedure: ARTERIOVENOUS (AV) FISTULA CREATION ( RADIOCEPHALIC ) STAGE 2;  Surgeon: Algernon Huxley, MD;  Location: [facility] ORS;  Service: Vascular;  Laterality: Right;  . CATARACT EXTRACTION  2006, 2011  . CHOLECYSTECTOMY  01/2008  . DIALYSIS/PERMA CATHETER REMOVAL N/A 08/20/2016   Procedure: Dialysis/Perma Catheter Removal;  Surgeon: Algernon Huxley, MD;  Location: [place] [facility];  Service: Cardiovascular;  Laterality:  N/A;  . EYE SURGERY     cataracts bilateral  . FEMUR IM NAIL Left 12/15/2015   Procedure: INTRAMEDULLARY (IM) RETROGRADE FEMORAL NAILING;  Surgeon: Oletta Cohn, DO;  Location: [facility] ORS;  Service: Orthopedics;  Laterality: Left;  . HIP PINNING,CANNULATED Left 05/10/2016   Procedure: CANNULATED HIP PINNING;  Surgeon: Earnestine Leys, MD;  Location: [facility] ORS;  Service: Orthopedics;  Laterality: Left;  . INSERTION OF DIALYSIS CATHETER   07/2010  . JOINT REPLACEMENT     left knee  . MINOR REMOVAL OF PERITONEAL DIALYSIS CATHETER  04/15/2016   Procedure: MINOR REMOVAL OF PERITONEAL DIALYSIS CATHETER;  Surgeon: Algernon Huxley, MD;  Location: [facility] ORS;  Service: Vascular;;  . PACEMAKER INSERTION  2006  . PERIPHERAL VASCULAR CATHETERIZATION N/A 12/18/2015   Procedure: Dialysis/Perma Catheter Insertion;  Surgeon: Katha Cabal, MD;  Location: [place] [facility];  Service: Cardiovascular;  Laterality: N/A;  . TOTAL KNEE ARTHROPLASTY  2008   LEFT/Dr Calif    Prior to Admission medications   Medication Sig Start Date End Date Taking? Authorizing Provider  acetaminophen (TYLENOL) 325 MG tablet Take 2 tablets (650 mg total) by mouth every 4 (four) hours as needed for mild pain or moderate pain. 05/14/16   Loletha Grayer, MD  diphenoxylate-atropine (LOMOTIL) 2.5-0.025 MG tablet TAKE 1 TABLET BY MOUTH FOUR TIMES DAILY AS NEEDED FOR DIARRHEA OR LOOSE STOOLS Patient taking differently: TAKE 1 TABLET BY MOUTH every 6 hours AS NEEDED FOR DIARRHEA OR LOOSE STOOLS 08/30/15   [name], MD  folic acid-vitamin b complex-vitamin c-selenium-zinc (DIALYVITE) 3 MG TABS tablet Take 1 tablet by mouth daily. (0900)    [provider]  levothyroxine (SYNTHROID, LEVOTHROID) 25 MCG tablet Take 1 tablet (25 mcg total) by mouth daily. Patient taking differently: Take 25 mcg by mouth daily. (0800) 10/25/15   [name], MD  lidocaine-prilocaine (EMLA) cream Apply 1 application topically every Monday, Wednesday, and Friday with hemodialysis. (1100) apply to right arm dialysis access pre-dialysis wrap with saran wrap after applying.    [provider]  metoCLOPramide (REGLAN) 5 MG tablet Take 1 tablet (5 mg total) by mouth every 8 (eight) hours as needed (vertigo). 05/30/16   Menshew, Dannielle Karvonen, PA-C  metoprolol succinate (TOPROL XL) 25 MG 24 hr tablet TAKE 1/2 BY MOUTH DAILY 01/30/16   [name], MD  mupirocin ointment  (BACTROBAN) 2 % Apply 1 application topically 2 (two) times daily. 09/17/16   Frederich Cha, MD  sertraline (ZOLOFT) 50 MG tablet TAKE 3 TABLETS(150 MG) BY MOUTH DAILY 08/30/15   [name], MD  sulfamethoxazole-trimethoprim (BACTRIM DS,SEPTRA DS) 800-160 MG tablet Take 1 tablet by mouth 2 (two) times daily. Patient not taking: Reported on 10/02/2016 09/17/16   Frederich Cha, MD  traMADol (ULTRAM) 50 MG tablet Take 1 tablet (50 mg total) by mouth every 12 (twelve) hours as needed for severe pain. 05/14/16   Loletha Grayer, MD  Vitamin D, Ergocalciferol, (DRISDOL) 50000 units CAPS capsule Take 50,000 Units by mouth every Monday. (0900) 04/17/15   [provider]     Allergies Statins; Effexor [venlafaxine]; and Codeine  Family History  Problem Relation Age of Onset  . Cancer Mother        ? ovarian - sarcoma  . Cancer Father        Skin cancer  . Diabetes Sister   . COPD Sister   . Depression Sister   . Cancer Sister  Lung - 58 yrs old    Social History Social History  Substance Use Topics  . Smoking status: Former Smoker    Quit date: 03/31/1948  . Smokeless tobacco: Never Used  . Alcohol use No    Review of Systems  Constitutional: NoDizziness Eyes: No visual changes.  ENT: Mild neck pain as above Cardiovascular: Denies chest pain. Respiratory: Denies shortness of breath. Gastrointestinal: No abdominal pain.  No nausea, no vomiting.   Genitourinary: Negative for dysuria. Musculoskeletal: Right knee bruise Skin: Laceration to the forehead Neurological: Negative for headaches   ____________________________________________   PHYSICAL EXAM:  VITAL SIGNS: ED Triage Vitals  Enc Vitals Group     BP 10/11/16 1012 (!) 147/92     Pulse Rate 10/11/16 1012 65     Resp 10/11/16 1012 18     Temp 10/11/16 1012 98.1 F (36.7 C)     Temp Source 10/11/16 1012 Oral     SpO2 10/11/16 1012 96 %     Weight 10/11/16 1013 47.2 kg (104 lb)     Height 10/11/16 1013  1.626 m (5\' 4" )     Head Circumference --      Peak Flow --      Pain Score --      Pain Loc --      Pain Edu? --      Excl. in [place]? --     Constitutional: Alert. No acute distress. Pleasant and interactive Eyes: Conjunctivae are normal.  Head: Small stellate laceration to the right forehead, bleeding controlled Nose: No congestion/rhinnorhea. Mouth/Throat: Mucous membranes are moist.   Neck:  Painless ROM, no vertebral tenderness to palpation Cardiovascular: Normal rate, regular rhythm. Grossly normal heart sounds.  Good peripheral circulation. Respiratory: Normal respiratory effort.  No retractions. Lungs CTAB. Gastrointestinal: Soft and nontender. No distention.  No CVA tenderness. Genitourinary: deferred Musculoskeletal: Mild contusion to the right knee, full range of motion, no bony abnormalities. No pain with axial load to both hips. Full range of motion of all extremities. Mild discomfort with extension of left index finger but no significant swelling. Warm and well perfused diffusely Neurologic:  Normal speech and language. No gross focal neurologic deficits are appreciated.  Skin:  Skin is warm, dry and intact. No rash noted. Psychiatric: Mood and affect are normal. Speech and behavior are normal.  ____________________________________________   LABS (all labs ordered are listed, but only abnormal results are displayed)  Labs Reviewed  CBC - Abnormal; Notable for the following:       Result Value   RDW 17.7 (*)    All other components within normal limits  BASIC METABOLIC PANEL - Abnormal; Notable for the following:    Potassium 3.2 (*)    Chloride 97 (*)    Glucose, Bld 114 (*)    BUN 24 (*)    Creatinine, Ser 2.48 (*)    GFR calc non Af Amer 16 (*)    GFR calc Af Amer 19 (*)    All other components within normal limits   ____________________________________________  EKG  None ____________________________________________  RADIOLOGY  Ct head and cspine  nad Left hand xray unremarkable cxr demonstrates right pleural effusion, unclear if old ____________________________________________   PROCEDURES  Procedure(s) performed: yes  LACERATION REPAIR Performed by: Lavonia Drafts Authorized by: Lavonia Drafts Consent: Verbal consent obtained. Risks and benefits: risks, benefits and alternatives were discussed Consent given by: patient Patient identity confirmed: provided demographic data Prepped and Draped in normal sterile fashion Wound explored  Laceration Location: forehead  Laceration Length: 2 cm  No Foreign Bodies seen or palpated  Anesthesia: local infiltration  Local anesthetic: lidocaine 1%  Anesthetic total: 2 ml  Irrigation method: syringe Amount of cleaning: standard  Skin closure: ethilon  Number of sutures: 1  Technique: simple interrupted  Patient tolerance: Patient tolerated the procedure well with no immediate complications.     Critical Care performed: No ____________________________________________   INITIAL IMPRESSION / ASSESSMENT AND PLAN / ED COURSE  Pertinent labs & imaging results that were available during my care of the patient were reviewed by me and considered in my medical decision making (see chart for details).  Patient presents for mechanical fall, lab work has been unrevealing in the past, does not feel it is necessary again today. We will image the head and cervical spine as well as the left hand. Laceration repair is necessary  Patient's imaging is overall reassuring. Pleural effusion noted incidentally on ct, follow up xray confirms. No indication of infection, wbc normal, afebrile. No sob or hypoxia. Will have patient follow up closely with PCP    ____________________________________________   FINAL CLINICAL IMPRESSION(S) / ED DIAGNOSES  Final diagnoses:  Injury of head, initial encounter  Forehead laceration, initial encounter  Pleural effusion      NEW MEDICATIONS  STARTED DURING THIS VISIT:  New Prescriptions   No medications on file     Note:  This document was prepared using Dragon voice recognition software and may include unintentional dictation errors.    Lavonia Drafts, MD 10/11/16 [phone]

## 2016-10-11 NOTE — Progress Notes (Signed)
LCSW reviewed patient information and consulted with EDP- Patient will not be addmited therefore no assessment required. Patient will return to [facility] when medical workup completed. No further needs  [employer] LCSW [phone]

## 2016-10-11 NOTE — ED Triage Notes (Signed)
Pt arrived via EMS from [facility] s/p fall. Pt has had multiple falls recently.  Pt was pushing wheelchair and fell hitting her head and knee. Pt c/o knee pain. Per EMS pt has laceration to forehead, currently wrapped in guaze.  Pt is alert and oriented to person, place, situation and time.  Speech is clear.

## 2016-10-12 DIAGNOSIS — D631 Anemia in chronic kidney disease: Secondary | ICD-10-CM | POA: Diagnosis not present

## 2016-10-12 DIAGNOSIS — K589 Irritable bowel syndrome without diarrhea: Secondary | ICD-10-CM | POA: Diagnosis not present

## 2016-10-12 DIAGNOSIS — R262 Difficulty in walking, not elsewhere classified: Secondary | ICD-10-CM | POA: Diagnosis not present

## 2016-10-12 DIAGNOSIS — M6281 Muscle weakness (generalized): Secondary | ICD-10-CM | POA: Diagnosis not present

## 2016-10-12 DIAGNOSIS — F329 Major depressive disorder, single episode, unspecified: Secondary | ICD-10-CM | POA: Diagnosis not present

## 2016-10-12 DIAGNOSIS — I10 Essential (primary) hypertension: Secondary | ICD-10-CM | POA: Diagnosis not present

## 2016-10-12 DIAGNOSIS — N186 End stage renal disease: Secondary | ICD-10-CM | POA: Diagnosis not present

## 2016-10-12 DIAGNOSIS — D509 Iron deficiency anemia, unspecified: Secondary | ICD-10-CM | POA: Diagnosis not present

## 2016-10-12 DIAGNOSIS — Z992 Dependence on renal dialysis: Secondary | ICD-10-CM | POA: Diagnosis not present

## 2016-10-13 ENCOUNTER — Other Ambulatory Visit: Payer: Self-pay | Admitting: *Deleted

## 2016-10-13 ENCOUNTER — Telehealth: Payer: Self-pay | Admitting: Family Medicine

## 2016-10-13 DIAGNOSIS — M6281 Muscle weakness (generalized): Secondary | ICD-10-CM | POA: Diagnosis not present

## 2016-10-13 DIAGNOSIS — R262 Difficulty in walking, not elsewhere classified: Secondary | ICD-10-CM | POA: Diagnosis not present

## 2016-10-13 NOTE — Telephone Encounter (Signed)
Noted  

## 2016-10-13 NOTE — Telephone Encounter (Signed)
FYI - Pt brother called and stated that pt was been readmitted to [facility] and has a PCP over there.

## 2016-10-13 NOTE — Patient Outreach (Signed)
[facility]) Care Management  10/13/2016  Sharon [name] 05/02/1926 [id number]   This patient had been followed by Burgess Amor. This patient is at [facility] for long term care. No further intervention needed.  [place] Care Management [phone]

## 2016-10-13 NOTE — Telephone Encounter (Signed)
fyi

## 2016-10-14 DIAGNOSIS — R262 Difficulty in walking, not elsewhere classified: Secondary | ICD-10-CM | POA: Diagnosis not present

## 2016-10-14 DIAGNOSIS — N186 End stage renal disease: Secondary | ICD-10-CM | POA: Diagnosis not present

## 2016-10-14 DIAGNOSIS — Z992 Dependence on renal dialysis: Secondary | ICD-10-CM | POA: Diagnosis not present

## 2016-10-14 DIAGNOSIS — M6281 Muscle weakness (generalized): Secondary | ICD-10-CM | POA: Diagnosis not present

## 2016-10-14 DIAGNOSIS — D631 Anemia in chronic kidney disease: Secondary | ICD-10-CM | POA: Diagnosis not present

## 2016-10-14 DIAGNOSIS — D509 Iron deficiency anemia, unspecified: Secondary | ICD-10-CM | POA: Diagnosis not present

## 2016-10-15 ENCOUNTER — Ambulatory Visit: Payer: Medicare Other | Admitting: Family Medicine

## 2016-10-15 DIAGNOSIS — R262 Difficulty in walking, not elsewhere classified: Secondary | ICD-10-CM | POA: Diagnosis not present

## 2016-10-15 DIAGNOSIS — M6281 Muscle weakness (generalized): Secondary | ICD-10-CM | POA: Diagnosis not present

## 2016-10-16 DIAGNOSIS — M6281 Muscle weakness (generalized): Secondary | ICD-10-CM | POA: Diagnosis not present

## 2016-10-16 DIAGNOSIS — D631 Anemia in chronic kidney disease: Secondary | ICD-10-CM | POA: Diagnosis not present

## 2016-10-16 DIAGNOSIS — N186 End stage renal disease: Secondary | ICD-10-CM | POA: Diagnosis not present

## 2016-10-16 DIAGNOSIS — D509 Iron deficiency anemia, unspecified: Secondary | ICD-10-CM | POA: Diagnosis not present

## 2016-10-16 DIAGNOSIS — Z992 Dependence on renal dialysis: Secondary | ICD-10-CM | POA: Diagnosis not present

## 2016-10-16 DIAGNOSIS — R262 Difficulty in walking, not elsewhere classified: Secondary | ICD-10-CM | POA: Diagnosis not present

## 2016-10-19 DIAGNOSIS — D509 Iron deficiency anemia, unspecified: Secondary | ICD-10-CM | POA: Diagnosis not present

## 2016-10-19 DIAGNOSIS — Z992 Dependence on renal dialysis: Secondary | ICD-10-CM | POA: Diagnosis not present

## 2016-10-19 DIAGNOSIS — N186 End stage renal disease: Secondary | ICD-10-CM | POA: Diagnosis not present

## 2016-10-19 DIAGNOSIS — D631 Anemia in chronic kidney disease: Secondary | ICD-10-CM | POA: Diagnosis not present

## 2016-10-21 DIAGNOSIS — N186 End stage renal disease: Secondary | ICD-10-CM | POA: Diagnosis not present

## 2016-10-21 DIAGNOSIS — Z992 Dependence on renal dialysis: Secondary | ICD-10-CM | POA: Diagnosis not present

## 2016-10-21 DIAGNOSIS — D631 Anemia in chronic kidney disease: Secondary | ICD-10-CM | POA: Diagnosis not present

## 2016-10-21 DIAGNOSIS — D509 Iron deficiency anemia, unspecified: Secondary | ICD-10-CM | POA: Diagnosis not present

## 2016-10-22 ENCOUNTER — Other Ambulatory Visit: Payer: Self-pay | Admitting: *Deleted

## 2016-10-22 NOTE — Patient Outreach (Signed)
[facility]) Care Management  10/22/2016  [name] December 31, 1926 [id number]  Met with Sharon [name], SW at facility. She reports patient is admitting to ALF part of facility today and no plans to discharge home.   Plan to sign off. Royetta Crochet. Laymond Purser, RN, BSN, [place] [phone]) Business Cell  [phone]) Toll Free Office

## 2016-10-23 DIAGNOSIS — Z992 Dependence on renal dialysis: Secondary | ICD-10-CM | POA: Diagnosis not present

## 2016-10-23 DIAGNOSIS — D509 Iron deficiency anemia, unspecified: Secondary | ICD-10-CM | POA: Diagnosis not present

## 2016-10-23 DIAGNOSIS — D631 Anemia in chronic kidney disease: Secondary | ICD-10-CM | POA: Diagnosis not present

## 2016-10-23 DIAGNOSIS — N186 End stage renal disease: Secondary | ICD-10-CM | POA: Diagnosis not present

## 2016-10-25 DIAGNOSIS — N186 End stage renal disease: Secondary | ICD-10-CM | POA: Diagnosis not present

## 2016-10-25 DIAGNOSIS — Z992 Dependence on renal dialysis: Secondary | ICD-10-CM | POA: Diagnosis not present

## 2016-10-26 DIAGNOSIS — D509 Iron deficiency anemia, unspecified: Secondary | ICD-10-CM | POA: Diagnosis not present

## 2016-10-26 DIAGNOSIS — N186 End stage renal disease: Secondary | ICD-10-CM | POA: Diagnosis not present

## 2016-10-26 DIAGNOSIS — R0600 Dyspnea, unspecified: Secondary | ICD-10-CM | POA: Diagnosis not present

## 2016-10-26 DIAGNOSIS — Z992 Dependence on renal dialysis: Secondary | ICD-10-CM | POA: Diagnosis not present

## 2016-10-26 DIAGNOSIS — N2581 Secondary hyperparathyroidism of renal origin: Secondary | ICD-10-CM | POA: Diagnosis not present

## 2016-10-26 DIAGNOSIS — D631 Anemia in chronic kidney disease: Secondary | ICD-10-CM | POA: Diagnosis not present

## 2016-10-26 DIAGNOSIS — G4700 Insomnia, unspecified: Secondary | ICD-10-CM | POA: Diagnosis not present

## 2016-10-26 DIAGNOSIS — R17 Unspecified jaundice: Secondary | ICD-10-CM | POA: Diagnosis not present

## 2016-10-27 ENCOUNTER — Ambulatory Visit: Payer: Medicare Other | Admitting: Family Medicine

## 2016-10-27 DIAGNOSIS — N186 End stage renal disease: Secondary | ICD-10-CM | POA: Diagnosis not present

## 2016-10-27 DIAGNOSIS — I1311 Hypertensive heart and chronic kidney disease without heart failure, with stage 5 chronic kidney disease, or end stage renal disease: Secondary | ICD-10-CM | POA: Diagnosis not present

## 2016-10-27 DIAGNOSIS — R05 Cough: Secondary | ICD-10-CM | POA: Diagnosis not present

## 2016-10-28 DIAGNOSIS — N2581 Secondary hyperparathyroidism of renal origin: Secondary | ICD-10-CM | POA: Diagnosis not present

## 2016-10-28 DIAGNOSIS — R17 Unspecified jaundice: Secondary | ICD-10-CM | POA: Diagnosis not present

## 2016-10-28 DIAGNOSIS — G4700 Insomnia, unspecified: Secondary | ICD-10-CM | POA: Diagnosis not present

## 2016-10-28 DIAGNOSIS — Z992 Dependence on renal dialysis: Secondary | ICD-10-CM | POA: Diagnosis not present

## 2016-10-28 DIAGNOSIS — J189 Pneumonia, unspecified organism: Secondary | ICD-10-CM | POA: Diagnosis not present

## 2016-10-28 DIAGNOSIS — R0600 Dyspnea, unspecified: Secondary | ICD-10-CM | POA: Diagnosis not present

## 2016-10-28 DIAGNOSIS — D509 Iron deficiency anemia, unspecified: Secondary | ICD-10-CM | POA: Diagnosis not present

## 2016-10-28 DIAGNOSIS — N186 End stage renal disease: Secondary | ICD-10-CM | POA: Diagnosis not present

## 2016-10-28 DIAGNOSIS — D631 Anemia in chronic kidney disease: Secondary | ICD-10-CM | POA: Diagnosis not present

## 2016-10-29 DIAGNOSIS — M6281 Muscle weakness (generalized): Secondary | ICD-10-CM | POA: Diagnosis not present

## 2016-10-29 DIAGNOSIS — R262 Difficulty in walking, not elsewhere classified: Secondary | ICD-10-CM | POA: Diagnosis not present

## 2016-10-30 DIAGNOSIS — D631 Anemia in chronic kidney disease: Secondary | ICD-10-CM | POA: Diagnosis not present

## 2016-10-30 DIAGNOSIS — N2581 Secondary hyperparathyroidism of renal origin: Secondary | ICD-10-CM | POA: Diagnosis not present

## 2016-10-30 DIAGNOSIS — Z992 Dependence on renal dialysis: Secondary | ICD-10-CM | POA: Diagnosis not present

## 2016-10-30 DIAGNOSIS — D509 Iron deficiency anemia, unspecified: Secondary | ICD-10-CM | POA: Diagnosis not present

## 2016-10-30 DIAGNOSIS — N186 End stage renal disease: Secondary | ICD-10-CM | POA: Diagnosis not present

## 2016-11-02 DIAGNOSIS — D631 Anemia in chronic kidney disease: Secondary | ICD-10-CM | POA: Diagnosis not present

## 2016-11-02 DIAGNOSIS — Z992 Dependence on renal dialysis: Secondary | ICD-10-CM | POA: Diagnosis not present

## 2016-11-02 DIAGNOSIS — N2581 Secondary hyperparathyroidism of renal origin: Secondary | ICD-10-CM | POA: Diagnosis not present

## 2016-11-02 DIAGNOSIS — M6281 Muscle weakness (generalized): Secondary | ICD-10-CM | POA: Diagnosis not present

## 2016-11-02 DIAGNOSIS — D509 Iron deficiency anemia, unspecified: Secondary | ICD-10-CM | POA: Diagnosis not present

## 2016-11-02 DIAGNOSIS — R262 Difficulty in walking, not elsewhere classified: Secondary | ICD-10-CM | POA: Diagnosis not present

## 2016-11-02 DIAGNOSIS — N186 End stage renal disease: Secondary | ICD-10-CM | POA: Diagnosis not present

## 2016-11-04 DIAGNOSIS — Z992 Dependence on renal dialysis: Secondary | ICD-10-CM | POA: Diagnosis not present

## 2016-11-04 DIAGNOSIS — D509 Iron deficiency anemia, unspecified: Secondary | ICD-10-CM | POA: Diagnosis not present

## 2016-11-04 DIAGNOSIS — N2581 Secondary hyperparathyroidism of renal origin: Secondary | ICD-10-CM | POA: Diagnosis not present

## 2016-11-04 DIAGNOSIS — D631 Anemia in chronic kidney disease: Secondary | ICD-10-CM | POA: Diagnosis not present

## 2016-11-04 DIAGNOSIS — N186 End stage renal disease: Secondary | ICD-10-CM | POA: Diagnosis not present

## 2016-11-06 DIAGNOSIS — N2581 Secondary hyperparathyroidism of renal origin: Secondary | ICD-10-CM | POA: Diagnosis not present

## 2016-11-06 DIAGNOSIS — R262 Difficulty in walking, not elsewhere classified: Secondary | ICD-10-CM | POA: Diagnosis not present

## 2016-11-06 DIAGNOSIS — D509 Iron deficiency anemia, unspecified: Secondary | ICD-10-CM | POA: Diagnosis not present

## 2016-11-06 DIAGNOSIS — D631 Anemia in chronic kidney disease: Secondary | ICD-10-CM | POA: Diagnosis not present

## 2016-11-06 DIAGNOSIS — N186 End stage renal disease: Secondary | ICD-10-CM | POA: Diagnosis not present

## 2016-11-06 DIAGNOSIS — M6281 Muscle weakness (generalized): Secondary | ICD-10-CM | POA: Diagnosis not present

## 2016-11-06 DIAGNOSIS — Z992 Dependence on renal dialysis: Secondary | ICD-10-CM | POA: Diagnosis not present

## 2016-11-09 DIAGNOSIS — N2581 Secondary hyperparathyroidism of renal origin: Secondary | ICD-10-CM | POA: Diagnosis not present

## 2016-11-09 DIAGNOSIS — N186 End stage renal disease: Secondary | ICD-10-CM | POA: Diagnosis not present

## 2016-11-09 DIAGNOSIS — D631 Anemia in chronic kidney disease: Secondary | ICD-10-CM | POA: Diagnosis not present

## 2016-11-09 DIAGNOSIS — D509 Iron deficiency anemia, unspecified: Secondary | ICD-10-CM | POA: Diagnosis not present

## 2016-11-09 DIAGNOSIS — Z992 Dependence on renal dialysis: Secondary | ICD-10-CM | POA: Diagnosis not present

## 2016-11-10 ENCOUNTER — Other Ambulatory Visit: Payer: Self-pay

## 2016-11-10 ENCOUNTER — Other Ambulatory Visit
Admission: RE | Admit: 2016-11-10 | Discharge: 2016-11-10 | Disposition: A | Discharge status: Home / Self Care | Payer: Medicare Other | Source: Ambulatory Visit | Attending: Gastroenterology | Admitting: Gastroenterology

## 2016-11-10 ENCOUNTER — Encounter (INDEPENDENT_AMBULATORY_CARE_PROVIDER_SITE_OTHER): Payer: Self-pay

## 2016-11-10 ENCOUNTER — Ambulatory Visit (INDEPENDENT_AMBULATORY_CARE_PROVIDER_SITE_OTHER): Payer: Medicare Other | Admitting: Gastroenterology

## 2016-11-10 DIAGNOSIS — R188 Other ascites: Secondary | ICD-10-CM

## 2016-11-10 DIAGNOSIS — K7469 Other cirrhosis of liver: Secondary | ICD-10-CM

## 2016-11-10 DIAGNOSIS — K529 Noninfective gastroenteritis and colitis, unspecified: Secondary | ICD-10-CM

## 2016-11-10 DIAGNOSIS — R262 Difficulty in walking, not elsewhere classified: Secondary | ICD-10-CM | POA: Diagnosis not present

## 2016-11-10 DIAGNOSIS — M6281 Muscle weakness (generalized): Secondary | ICD-10-CM | POA: Diagnosis not present

## 2016-11-10 LAB — PROTIME-INR
INR: 1.14
PROTHROMBIN TIME: 14.5 s (ref 11.4–15.2)

## 2016-11-10 LAB — GAMMA GT: GGT: 87 U/L — ABNORMAL HIGH (ref 7–50)

## 2016-11-10 LAB — FERRITIN: Ferritin: 820 ng/mL — ABNORMAL HIGH (ref 11–307)

## 2016-11-10 NOTE — Progress Notes (Signed)
Cephas Darby, MD [address]  [place]  [place], [place] [address]  Main: [phone]  Fax: [phone]    Gastroenterology Consultation  Referring Provider:     [name], MD Primary Care Physician:  [name], MD Primary Gastroenterologist:  Dr. Cephas Darby Reason for Consultation:   New diagnosis of cirrhosis        HPI:   Sharon [name] is a 83 y.o. y/o female referred by Dr. Caryl Bis, Angela Adam, MD  for consultation & management of new diagnosis of cirrhosis with ascites based on recent abdominal imaging. She has history of chronic systolic heart failure and sick sinus syndrome status post pacemaker, EF 30%, hypothyroidism, eSRD on hemodialysis, lives in a long-term facility and is accompanied by her brother today. She is in wheelchair and lost about 50 pounds in last 1 year. She was admitted to Hospital secondary to episodes of pneumonia. She had a CT A/P in 09/2016 which revealed nodular contour of the liver, mesenteric edema and perihepatic fluid as well as free fluid in pelvis. Apparently,CT A/P from 12/2014 revealed nodular contour of the liver consistent with cirrhosis. There was no evidence of splenomegaly. She is found to have abnormal LFTs since 04/2016, predominantly low albumin, mildly elevated alkaline phosphatase, and total bilirubin. Her transaminases have always been normal. She does not have thrombocytopenia or anemia. And, she does not have coagulopathy  She has been having chronic nonbloody diarrhea for several years, was diagnosed with irritable bowel syndrome. He had a flexible sigmoidoscopy in 2016 with random sigmoid biopsies negative for microscopic colitis or inflammation  She reports that her weight has stabilized. Denies having any abdominal pain. Does report intermittent swelling of legs. She acknowledges eating high salt containing snacks. She reports easy bruising but denies hematemesis, rectal bleeding or melena. Denies  shortness of breath. She is closely followed by her cardiologist at [facility] and currently not on any blood thinners.  She was never a heavy drinker in her life Denies having any blood transfusions in the past She is immune to hepatitis B. Unknown hepatitis C status  GI Procedures: never had an upper endoscopy. Colonoscopy 07/2009, reportedly normal Flexible sigmoidoscopy 01/2014 sigmoid diverticulosis   Past Medical History:  Diagnosis Date  . Arthritis   . CAD (coronary artery disease)   . Cancer ([place])    skin  . Cardiomyopathy ([place])   . Depression   . IBS (irritable bowel syndrome) 2010  . Kidney failure July 2012   Hemodialysis 3xweek  . Kidney failure   . Meniere disease   . Meniere's disease   . Myocardial infarction ([place])   . OSA (obstructive sleep apnea)    CPAP  . Peritoneal dialysis status ([place])   . Presence of permanent cardiac pacemaker     Past Surgical History:  Procedure Laterality Date  . [place]  . ANUS SURGERY  2010  . AV FISTULA PLACEMENT Right 04/15/2016   Procedure: ARTERIOVENOUS (AV) FISTULA CREATION ( RADIOCEPHALIC ) STAGE 2;  Surgeon: Algernon Huxley, MD;  Location: [facility] ORS;  Service: Vascular;  Laterality: Right;  . CATARACT EXTRACTION  2006, 2011  . CHOLECYSTECTOMY  01/2008  . DIALYSIS/PERMA CATHETER REMOVAL N/A 08/20/2016   Procedure: Dialysis/Perma Catheter Removal;  Surgeon: Algernon Huxley, MD;  Location: [place] [facility];  Service: Cardiovascular;  Laterality: N/A;  . EYE SURGERY     cataracts bilateral  . FEMUR IM NAIL Left 12/15/2015  Procedure: INTRAMEDULLARY (IM) RETROGRADE FEMORAL NAILING;  Surgeon: Oletta Cohn, DO;  Location: [facility] ORS;  Service: Orthopedics;  Laterality: Left;  . HIP PINNING,CANNULATED Left 05/10/2016   Procedure: CANNULATED HIP PINNING;  Surgeon: Earnestine Leys, MD;  Location: [facility] ORS;  Service: Orthopedics;  Laterality: Left;  . INSERTION OF DIALYSIS CATHETER  07/2010  . JOINT REPLACEMENT     left  knee  . MINOR REMOVAL OF PERITONEAL DIALYSIS CATHETER  04/15/2016   Procedure: MINOR REMOVAL OF PERITONEAL DIALYSIS CATHETER;  Surgeon: Algernon Huxley, MD;  Location: [facility] ORS;  Service: Vascular;;  . PACEMAKER INSERTION  2006  . PERIPHERAL VASCULAR CATHETERIZATION N/A 12/18/2015   Procedure: Dialysis/Perma Catheter Insertion;  Surgeon: Katha Cabal, MD;  Location: [place] [facility];  Service: Cardiovascular;  Laterality: N/A;  . TOTAL KNEE ARTHROPLASTY  2008   LEFT/Dr Calif    Prior to Admission medications   Medication Sig Start Date End Date Taking? Authorizing Provider  acetaminophen (TYLENOL) 325 MG tablet Take 2 tablets (650 mg total) by mouth every 4 (four) hours as needed for mild pain or moderate pain. 05/14/16  Yes Wieting, Richard, MD  diphenoxylate-atropine (LOMOTIL) 2.5-0.025 MG tablet TAKE 1 TABLET BY MOUTH FOUR TIMES DAILY AS NEEDED FOR DIARRHEA OR LOOSE STOOLS Patient taking differently: TAKE 1 TABLET BY MOUTH every 6 hours AS NEEDED FOR DIARRHEA OR LOOSE STOOLS 08/30/15  Yes [name], MD  levothyroxine (SYNTHROID, LEVOTHROID) 25 MCG tablet Take 1 tablet (25 mcg total) by mouth daily. Patient taking differently: Take 25 mcg by mouth daily. (0800) 10/25/15  Yes [name], MD  lidocaine-prilocaine (EMLA) cream Apply 1 application topically every Monday, Wednesday, and Friday with hemodialysis. (1100) apply to right arm dialysis access pre-dialysis wrap with saran wrap after applying.   Yes [provider]  metoCLOPramide (REGLAN) 5 MG tablet Take 1 tablet (5 mg total) by mouth every 8 (eight) hours as needed (vertigo). 05/30/16  Yes Menshew, Dannielle Karvonen, PA-C  metoprolol succinate (TOPROL XL) 25 MG 24 hr tablet TAKE 1/2 BY MOUTH DAILY 01/30/16  Yes [name], MD  mupirocin ointment (BACTROBAN) 2 % Apply 1 application topically 2 (two) times daily. 09/17/16  Yes Frederich Cha, MD  sertraline (ZOLOFT) 50 MG tablet TAKE 3 TABLETS(150 MG) BY MOUTH DAILY  08/30/15  Yes [name], MD  traMADol (ULTRAM) 50 MG tablet Take 1 tablet (50 mg total) by mouth every 12 (twelve) hours as needed for severe pain. 05/14/16  Yes Loletha Grayer, MD  Vitamin D, Ergocalciferol, (DRISDOL) 50000 units CAPS capsule Take 50,000 Units by mouth every Monday. (0900) 04/17/15  Yes [provider]  potassium chloride (K-DUR,KLOR-CON) 10 MEQ tablet Take by mouth.    [provider]    Family History  Problem Relation Age of Onset  . Cancer Mother        ? ovarian - sarcoma  . Cancer Father        Skin cancer  . Diabetes Sister   . COPD Sister   . Depression Sister   . Cancer Sister        Lung - 41 yrs old     Social History  Substance Use Topics  . Smoking status: Former Smoker    Quit date: 03/31/1948  . Smokeless tobacco: Never Used  . Alcohol use No    Allergies as of 11/10/2016 - Review Complete 10/11/2016  Allergen Reaction Noted  . Statins Other (See Comments) 11/15/2013  . Codeine sulfate Nausea  And Vomiting 09/18/2013  . Codeine Other (See Comments) 11/10/2012  . Effexor [venlafaxine] Nausea Only 01/16/2015  . Ezetimibe-simvastatin Other (See Comments) 09/18/2013    Review of Systems:    All systems reviewed and negative except where noted in HPI.   Physical Exam:  There were no vitals taken for this visit. No LMP recorded. Patient has had a hysterectomy.  General:   Alert, frail appearing, pleasant and cooperative in NAD Head:  Normocephalic and atraumatic. Eyes:  Sclera clear, no icterus.   Conjunctiva pink. Ears: hard of hearing Nose:  No deformity, discharge, or lesions. Mouth:  No deformity or lesions,oropharynx pink & moist. Neck:  Supple; no masses or thyromegaly. Lungs:  Respirations even and unlabored.  Clear throughout to auscultation.   No wheezes, crackles, or rhonchi. No acute distress. Heart:  Regular rate and rhythm; no murmurs, clicks, rubs, or gallops. Abdomen:  Normal bowel sounds.  No bruits.   Soft, non-tender and non-distended without masses, hepatosplenomegaly or hernias noted.  No guarding or rebound tenderness.   Rectal: Nor performed Msk:  Symmetrical without gross deformities. Sitting in wheelchair, moving all extremities Pulses:  Normal pulses noted. Extremities:  No clubbing or edema.  No cyanosis, multiple spontaneous bruises in bilateral upper extremities Neurologic:  Alert and oriented x3; Skin:  Intact without significant lesions or rashes. No jaundice. Lymph Nodes:  No significant cervical adenopathy. Psych:  Alert and cooperative. Normal mood and affect.  Imaging Studies: reviewed  Assessment and Plan:   Sharon [name] is a 83 y.o. female with hypothyroidism, chronic systolic heart failure, EF 30%, sick sinus syndrome status post pacemaker, ESRD on hemodialysis with new diagnosis of cirrhosis and mild ascites based on cross-sectional imaging and mildly abnormal LFTs, predominantly cholestatic.  Cirrhosis: most likely secondary to systolic heart failure, resulting in congestive hepatopathy and cholestatic picture There is no clear evidence of portal hypertension other than ascites Ascites could be multifactorial secondary to cardiac, renal or liver disease or combination of these Perform secondary liver disease workup Recommend Ultrasound-guided diagnostic paracentesis and ascitic fluid analysis Recommend low-sodium diet  Chronic diarrhea with weight loss: currently her weight is stable. Flexible sigmoidoscopy in 01/2014 did not reveal evidence of inflammation or microscopic colitis. Other differentials include celiac disease or exocrine pancreatic insufficiency or chronic infectious diarrhea or irritable bowel syndrome or neuroendocrine tumors  - Check celiac serologies - Pancreatic fecal elastase - GI pathogen panel - HIV - further workup based on about test results  Follow up in 4 weeks   Cephas Darby, MD

## 2016-11-11 DIAGNOSIS — Z992 Dependence on renal dialysis: Secondary | ICD-10-CM | POA: Diagnosis not present

## 2016-11-11 DIAGNOSIS — N186 End stage renal disease: Secondary | ICD-10-CM | POA: Diagnosis not present

## 2016-11-11 DIAGNOSIS — D509 Iron deficiency anemia, unspecified: Secondary | ICD-10-CM | POA: Diagnosis not present

## 2016-11-11 DIAGNOSIS — N2581 Secondary hyperparathyroidism of renal origin: Secondary | ICD-10-CM | POA: Diagnosis not present

## 2016-11-11 DIAGNOSIS — D631 Anemia in chronic kidney disease: Secondary | ICD-10-CM | POA: Diagnosis not present

## 2016-11-11 LAB — MISC LABCORP TEST (SEND OUT)
LABCORP TEST CODE: 1784
Labcorp test code: 1982
Labcorp test code: 164855

## 2016-11-11 LAB — MITOCHONDRIAL ANTIBODIES: Mitochondrial M2 Ab, IgG: 2.9 Units (ref 0.0–20.0)

## 2016-11-11 LAB — CERULOPLASMIN: CERULOPLASMIN: 28.4 mg/dL (ref 19.0–39.0)

## 2016-11-11 LAB — ANTI-MICROSOMAL ANTIBODY LIVER / KIDNEY: LKM1 AB: 2.2 U (ref 0.0–20.0)

## 2016-11-11 LAB — HCV RNA QUANT: HCV QUANT: NOT DETECTED [IU]/mL (ref >50)

## 2016-11-11 LAB — AFP TUMOR MARKER: AFP, Serum, Tumor Marker: 1.4 ng/mL (ref 0.0–8.3)

## 2016-11-11 LAB — ANTI-SMOOTH MUSCLE ANTIBODY, IGG: F-Actin IgG: 9 Units (ref 0–19)

## 2016-11-11 LAB — HIV ANTIBODY (ROUTINE TESTING W REFLEX): HIV Screen 4th Generation wRfx: NONREACTIVE

## 2016-11-12 ENCOUNTER — Telehealth: Payer: Self-pay

## 2016-11-12 ENCOUNTER — Other Ambulatory Visit
Admission: RE | Admit: 2016-11-12 | Discharge: 2016-11-12 | Disposition: A | Discharge status: Home / Self Care | Payer: Medicare Other | Source: Ambulatory Visit | Attending: Gastroenterology | Admitting: Gastroenterology

## 2016-11-12 DIAGNOSIS — K529 Noninfective gastroenteritis and colitis, unspecified: Secondary | ICD-10-CM | POA: Diagnosis not present

## 2016-11-12 LAB — TISSUE TRANSGLUTAMINASE, IGG: Tissue Transglut Ab: <2 U/mL (ref 0–5)

## 2016-11-12 LAB — GASTROINTESTINAL PANEL BY PCR, STOOL (REPLACES STOOL CULTURE)
ADENOVIRUS F40/41: NOT DETECTED
Astrovirus: NOT DETECTED
CAMPYLOBACTER SPECIES: NOT DETECTED
CRYPTOSPORIDIUM: NOT DETECTED
CYCLOSPORA CAYETANENSIS: NOT DETECTED
ENTEROPATHOGENIC E COLI (EPEC): NOT DETECTED
Entamoeba histolytica: NOT DETECTED
Enteroaggregative E coli (EAEC): NOT DETECTED
Enterotoxigenic E coli (ETEC): NOT DETECTED
Giardia lamblia: NOT DETECTED
Norovirus GI/GII: NOT DETECTED
PLESIMONAS SHIGELLOIDES: NOT DETECTED
ROTAVIRUS A: NOT DETECTED
SAPOVIRUS (I, II, IV, AND V): NOT DETECTED
SHIGA LIKE TOXIN PRODUCING E COLI (STEC): NOT DETECTED
SHIGELLA/ENTEROINVASIVE E COLI (EIEC): NOT DETECTED
Salmonella species: NOT DETECTED
Vibrio cholerae: NOT DETECTED
Vibrio species: NOT DETECTED
YERSINIA ENTEROCOLITICA: NOT DETECTED

## 2016-11-12 LAB — FANA STAINING PATTERNS: Homogeneous Pattern: 1:80 {titer}

## 2016-11-12 LAB — TISSUE TRANSGLUTAMINASE, IGA

## 2016-11-12 NOTE — Telephone Encounter (Signed)
Patient has been scheduled for Paracentesis at [facility] on 11/19/16 at 1:30 pm.  Brother Ed and nursing staff at [facility] has been notified of this appt.  Thanks [employer]

## 2016-11-13 DIAGNOSIS — N2581 Secondary hyperparathyroidism of renal origin: Secondary | ICD-10-CM | POA: Diagnosis not present

## 2016-11-13 DIAGNOSIS — D631 Anemia in chronic kidney disease: Secondary | ICD-10-CM | POA: Diagnosis not present

## 2016-11-13 DIAGNOSIS — N186 End stage renal disease: Secondary | ICD-10-CM | POA: Diagnosis not present

## 2016-11-13 DIAGNOSIS — D509 Iron deficiency anemia, unspecified: Secondary | ICD-10-CM | POA: Diagnosis not present

## 2016-11-13 DIAGNOSIS — Z992 Dependence on renal dialysis: Secondary | ICD-10-CM | POA: Diagnosis not present

## 2016-11-16 DIAGNOSIS — N2581 Secondary hyperparathyroidism of renal origin: Secondary | ICD-10-CM | POA: Diagnosis not present

## 2016-11-16 DIAGNOSIS — D509 Iron deficiency anemia, unspecified: Secondary | ICD-10-CM | POA: Diagnosis not present

## 2016-11-16 DIAGNOSIS — Z992 Dependence on renal dialysis: Secondary | ICD-10-CM | POA: Diagnosis not present

## 2016-11-16 DIAGNOSIS — N186 End stage renal disease: Secondary | ICD-10-CM | POA: Diagnosis not present

## 2016-11-16 DIAGNOSIS — D631 Anemia in chronic kidney disease: Secondary | ICD-10-CM | POA: Diagnosis not present

## 2016-11-16 LAB — PANCREATIC ELASTASE, FECAL

## 2016-11-17 ENCOUNTER — Ambulatory Visit: Payer: Medicare Other

## 2016-11-17 ENCOUNTER — Other Ambulatory Visit: Payer: Self-pay | Admitting: Ophthalmology

## 2016-11-17 ENCOUNTER — Telehealth: Payer: Self-pay

## 2016-11-17 DIAGNOSIS — S72002D Fracture of unspecified part of neck of left femur, subsequent encounter for closed fracture with routine healing: Secondary | ICD-10-CM | POA: Diagnosis not present

## 2016-11-17 DIAGNOSIS — H532 Diplopia: Secondary | ICD-10-CM

## 2016-11-17 NOTE — Telephone Encounter (Signed)
Patients brother Sharon [name]) called.  He is POA for his sister.  He stated that his sister Sharon [name] doesn't want to have the liver biopsy due to her age.  I told Mr. Sharon [name] that I would pass this message on to you to see if you wanted to discuss this with him.  He said you may call him.  His number is [phone].

## 2016-11-18 ENCOUNTER — Telehealth: Payer: Self-pay

## 2016-11-18 DIAGNOSIS — N186 End stage renal disease: Secondary | ICD-10-CM | POA: Diagnosis not present

## 2016-11-18 DIAGNOSIS — D509 Iron deficiency anemia, unspecified: Secondary | ICD-10-CM | POA: Diagnosis not present

## 2016-11-18 DIAGNOSIS — N2581 Secondary hyperparathyroidism of renal origin: Secondary | ICD-10-CM | POA: Diagnosis not present

## 2016-11-18 DIAGNOSIS — Z992 Dependence on renal dialysis: Secondary | ICD-10-CM | POA: Diagnosis not present

## 2016-11-18 DIAGNOSIS — M6281 Muscle weakness (generalized): Secondary | ICD-10-CM | POA: Diagnosis not present

## 2016-11-18 DIAGNOSIS — D631 Anemia in chronic kidney disease: Secondary | ICD-10-CM | POA: Diagnosis not present

## 2016-11-18 DIAGNOSIS — R262 Difficulty in walking, not elsewhere classified: Secondary | ICD-10-CM | POA: Diagnosis not present

## 2016-11-18 NOTE — Telephone Encounter (Signed)
Patients brother has been informed that we've canceled paracentesis. Her brother has requested that I mail labs to her home address. I will place in mail today.  Thanks [employer]

## 2016-11-19 ENCOUNTER — Ambulatory Visit: Admission: RE | Admit: 2016-11-19 | Payer: Medicare Other | Source: Ambulatory Visit

## 2016-11-19 DIAGNOSIS — F330 Major depressive disorder, recurrent, mild: Secondary | ICD-10-CM | POA: Diagnosis not present

## 2016-11-19 DIAGNOSIS — F419 Anxiety disorder, unspecified: Secondary | ICD-10-CM | POA: Diagnosis not present

## 2016-11-20 DIAGNOSIS — Z992 Dependence on renal dialysis: Secondary | ICD-10-CM | POA: Diagnosis not present

## 2016-11-20 DIAGNOSIS — N186 End stage renal disease: Secondary | ICD-10-CM | POA: Diagnosis not present

## 2016-11-20 DIAGNOSIS — N2581 Secondary hyperparathyroidism of renal origin: Secondary | ICD-10-CM | POA: Diagnosis not present

## 2016-11-20 DIAGNOSIS — D509 Iron deficiency anemia, unspecified: Secondary | ICD-10-CM | POA: Diagnosis not present

## 2016-11-20 DIAGNOSIS — D631 Anemia in chronic kidney disease: Secondary | ICD-10-CM | POA: Diagnosis not present

## 2016-11-23 DIAGNOSIS — N186 End stage renal disease: Secondary | ICD-10-CM | POA: Diagnosis not present

## 2016-11-23 DIAGNOSIS — Z992 Dependence on renal dialysis: Secondary | ICD-10-CM | POA: Diagnosis not present

## 2016-11-23 DIAGNOSIS — D509 Iron deficiency anemia, unspecified: Secondary | ICD-10-CM | POA: Diagnosis not present

## 2016-11-23 DIAGNOSIS — N2581 Secondary hyperparathyroidism of renal origin: Secondary | ICD-10-CM | POA: Diagnosis not present

## 2016-11-23 DIAGNOSIS — D631 Anemia in chronic kidney disease: Secondary | ICD-10-CM | POA: Diagnosis not present

## 2016-11-25 DIAGNOSIS — D631 Anemia in chronic kidney disease: Secondary | ICD-10-CM | POA: Diagnosis not present

## 2016-11-25 DIAGNOSIS — D509 Iron deficiency anemia, unspecified: Secondary | ICD-10-CM | POA: Diagnosis not present

## 2016-11-25 DIAGNOSIS — Z992 Dependence on renal dialysis: Secondary | ICD-10-CM | POA: Diagnosis not present

## 2016-11-25 DIAGNOSIS — N2581 Secondary hyperparathyroidism of renal origin: Secondary | ICD-10-CM | POA: Diagnosis not present

## 2016-11-25 DIAGNOSIS — N186 End stage renal disease: Secondary | ICD-10-CM | POA: Diagnosis not present

## 2016-11-27 DIAGNOSIS — Z992 Dependence on renal dialysis: Secondary | ICD-10-CM | POA: Diagnosis not present

## 2016-11-27 DIAGNOSIS — N2581 Secondary hyperparathyroidism of renal origin: Secondary | ICD-10-CM | POA: Diagnosis not present

## 2016-11-27 DIAGNOSIS — N186 End stage renal disease: Secondary | ICD-10-CM | POA: Diagnosis not present

## 2016-11-27 DIAGNOSIS — D509 Iron deficiency anemia, unspecified: Secondary | ICD-10-CM | POA: Diagnosis not present

## 2016-11-30 DIAGNOSIS — N2581 Secondary hyperparathyroidism of renal origin: Secondary | ICD-10-CM | POA: Diagnosis not present

## 2016-11-30 DIAGNOSIS — N186 End stage renal disease: Secondary | ICD-10-CM | POA: Diagnosis not present

## 2016-11-30 DIAGNOSIS — D509 Iron deficiency anemia, unspecified: Secondary | ICD-10-CM | POA: Diagnosis not present

## 2016-11-30 DIAGNOSIS — Z992 Dependence on renal dialysis: Secondary | ICD-10-CM | POA: Diagnosis not present

## 2016-12-02 DIAGNOSIS — Z992 Dependence on renal dialysis: Secondary | ICD-10-CM | POA: Diagnosis not present

## 2016-12-02 DIAGNOSIS — N186 End stage renal disease: Secondary | ICD-10-CM | POA: Diagnosis not present

## 2016-12-02 DIAGNOSIS — D509 Iron deficiency anemia, unspecified: Secondary | ICD-10-CM | POA: Diagnosis not present

## 2016-12-02 DIAGNOSIS — N2581 Secondary hyperparathyroidism of renal origin: Secondary | ICD-10-CM | POA: Diagnosis not present

## 2016-12-03 ENCOUNTER — Ambulatory Visit
Admission: RE | Admit: 2016-12-03 | Discharge: 2016-12-03 | Disposition: A | Discharge status: Home / Self Care | Payer: Medicare Other | Source: Ambulatory Visit | Attending: Ophthalmology | Admitting: Ophthalmology

## 2016-12-03 DIAGNOSIS — H532 Diplopia: Secondary | ICD-10-CM | POA: Insufficient documentation

## 2016-12-03 DIAGNOSIS — I6782 Cerebral ischemia: Secondary | ICD-10-CM | POA: Diagnosis not present

## 2016-12-03 DIAGNOSIS — G319 Degenerative disease of nervous system, unspecified: Secondary | ICD-10-CM | POA: Diagnosis not present

## 2016-12-04 DIAGNOSIS — Z992 Dependence on renal dialysis: Secondary | ICD-10-CM | POA: Diagnosis not present

## 2016-12-04 DIAGNOSIS — D509 Iron deficiency anemia, unspecified: Secondary | ICD-10-CM | POA: Diagnosis not present

## 2016-12-04 DIAGNOSIS — N186 End stage renal disease: Secondary | ICD-10-CM | POA: Diagnosis not present

## 2016-12-04 DIAGNOSIS — N2581 Secondary hyperparathyroidism of renal origin: Secondary | ICD-10-CM | POA: Diagnosis not present

## 2016-12-07 DIAGNOSIS — N2581 Secondary hyperparathyroidism of renal origin: Secondary | ICD-10-CM | POA: Diagnosis not present

## 2016-12-07 DIAGNOSIS — Z992 Dependence on renal dialysis: Secondary | ICD-10-CM | POA: Diagnosis not present

## 2016-12-07 DIAGNOSIS — D509 Iron deficiency anemia, unspecified: Secondary | ICD-10-CM | POA: Diagnosis not present

## 2016-12-07 DIAGNOSIS — N186 End stage renal disease: Secondary | ICD-10-CM | POA: Diagnosis not present

## 2016-12-09 DIAGNOSIS — N2581 Secondary hyperparathyroidism of renal origin: Secondary | ICD-10-CM | POA: Diagnosis not present

## 2016-12-09 DIAGNOSIS — N186 End stage renal disease: Secondary | ICD-10-CM | POA: Diagnosis not present

## 2016-12-09 DIAGNOSIS — Z992 Dependence on renal dialysis: Secondary | ICD-10-CM | POA: Diagnosis not present

## 2016-12-09 DIAGNOSIS — D509 Iron deficiency anemia, unspecified: Secondary | ICD-10-CM | POA: Diagnosis not present

## 2016-12-11 DIAGNOSIS — N2581 Secondary hyperparathyroidism of renal origin: Secondary | ICD-10-CM | POA: Diagnosis not present

## 2016-12-11 DIAGNOSIS — Z992 Dependence on renal dialysis: Secondary | ICD-10-CM | POA: Diagnosis not present

## 2016-12-11 DIAGNOSIS — N186 End stage renal disease: Secondary | ICD-10-CM | POA: Diagnosis not present

## 2016-12-11 DIAGNOSIS — D509 Iron deficiency anemia, unspecified: Secondary | ICD-10-CM | POA: Diagnosis not present

## 2016-12-14 DIAGNOSIS — D509 Iron deficiency anemia, unspecified: Secondary | ICD-10-CM | POA: Diagnosis not present

## 2016-12-14 DIAGNOSIS — N2581 Secondary hyperparathyroidism of renal origin: Secondary | ICD-10-CM | POA: Diagnosis not present

## 2016-12-14 DIAGNOSIS — N186 End stage renal disease: Secondary | ICD-10-CM | POA: Diagnosis not present

## 2016-12-14 DIAGNOSIS — Z992 Dependence on renal dialysis: Secondary | ICD-10-CM | POA: Diagnosis not present

## 2016-12-16 DIAGNOSIS — Z992 Dependence on renal dialysis: Secondary | ICD-10-CM | POA: Diagnosis not present

## 2016-12-16 DIAGNOSIS — D509 Iron deficiency anemia, unspecified: Secondary | ICD-10-CM | POA: Diagnosis not present

## 2016-12-16 DIAGNOSIS — N2581 Secondary hyperparathyroidism of renal origin: Secondary | ICD-10-CM | POA: Diagnosis not present

## 2016-12-16 DIAGNOSIS — N186 End stage renal disease: Secondary | ICD-10-CM | POA: Diagnosis not present

## 2016-12-18 DIAGNOSIS — N2581 Secondary hyperparathyroidism of renal origin: Secondary | ICD-10-CM | POA: Diagnosis not present

## 2016-12-18 DIAGNOSIS — Z992 Dependence on renal dialysis: Secondary | ICD-10-CM | POA: Diagnosis not present

## 2016-12-18 DIAGNOSIS — D509 Iron deficiency anemia, unspecified: Secondary | ICD-10-CM | POA: Diagnosis not present

## 2016-12-18 DIAGNOSIS — N186 End stage renal disease: Secondary | ICD-10-CM | POA: Diagnosis not present

## 2016-12-21 DIAGNOSIS — D509 Iron deficiency anemia, unspecified: Secondary | ICD-10-CM | POA: Diagnosis not present

## 2016-12-21 DIAGNOSIS — N186 End stage renal disease: Secondary | ICD-10-CM | POA: Diagnosis not present

## 2016-12-21 DIAGNOSIS — N2581 Secondary hyperparathyroidism of renal origin: Secondary | ICD-10-CM | POA: Diagnosis not present

## 2016-12-21 DIAGNOSIS — Z992 Dependence on renal dialysis: Secondary | ICD-10-CM | POA: Diagnosis not present

## 2016-12-23 DIAGNOSIS — N2581 Secondary hyperparathyroidism of renal origin: Secondary | ICD-10-CM | POA: Diagnosis not present

## 2016-12-23 DIAGNOSIS — F419 Anxiety disorder, unspecified: Secondary | ICD-10-CM | POA: Diagnosis not present

## 2016-12-23 DIAGNOSIS — N186 End stage renal disease: Secondary | ICD-10-CM | POA: Diagnosis not present

## 2016-12-23 DIAGNOSIS — D509 Iron deficiency anemia, unspecified: Secondary | ICD-10-CM | POA: Diagnosis not present

## 2016-12-23 DIAGNOSIS — F39 Unspecified mood [affective] disorder: Secondary | ICD-10-CM | POA: Diagnosis not present

## 2016-12-23 DIAGNOSIS — K7460 Unspecified cirrhosis of liver: Secondary | ICD-10-CM | POA: Diagnosis not present

## 2016-12-23 DIAGNOSIS — Z992 Dependence on renal dialysis: Secondary | ICD-10-CM | POA: Diagnosis not present

## 2016-12-25 DIAGNOSIS — N186 End stage renal disease: Secondary | ICD-10-CM | POA: Diagnosis not present

## 2016-12-25 DIAGNOSIS — D509 Iron deficiency anemia, unspecified: Secondary | ICD-10-CM | POA: Diagnosis not present

## 2016-12-25 DIAGNOSIS — Z992 Dependence on renal dialysis: Secondary | ICD-10-CM | POA: Diagnosis not present

## 2016-12-25 DIAGNOSIS — N2581 Secondary hyperparathyroidism of renal origin: Secondary | ICD-10-CM | POA: Diagnosis not present

## 2016-12-28 DIAGNOSIS — Z992 Dependence on renal dialysis: Secondary | ICD-10-CM | POA: Diagnosis not present

## 2016-12-28 DIAGNOSIS — N186 End stage renal disease: Secondary | ICD-10-CM | POA: Diagnosis not present

## 2016-12-28 DIAGNOSIS — N2581 Secondary hyperparathyroidism of renal origin: Secondary | ICD-10-CM | POA: Diagnosis not present

## 2016-12-28 DIAGNOSIS — D509 Iron deficiency anemia, unspecified: Secondary | ICD-10-CM | POA: Diagnosis not present

## 2016-12-29 ENCOUNTER — Ambulatory Visit (INDEPENDENT_AMBULATORY_CARE_PROVIDER_SITE_OTHER): Payer: Medicare Other | Admitting: Vascular Surgery

## 2016-12-29 ENCOUNTER — Encounter (INDEPENDENT_AMBULATORY_CARE_PROVIDER_SITE_OTHER): Payer: Self-pay | Admitting: Vascular Surgery

## 2016-12-29 ENCOUNTER — Ambulatory Visit (INDEPENDENT_AMBULATORY_CARE_PROVIDER_SITE_OTHER): Payer: Medicare Other

## 2016-12-29 VITALS — BP 133/65 | HR 85 | Resp 16 | Ht 63.0 in | Wt 98.0 lb

## 2016-12-29 DIAGNOSIS — N186 End stage renal disease: Secondary | ICD-10-CM

## 2016-12-29 DIAGNOSIS — I10 Essential (primary) hypertension: Secondary | ICD-10-CM | POA: Diagnosis not present

## 2016-12-29 DIAGNOSIS — Z992 Dependence on renal dialysis: Secondary | ICD-10-CM | POA: Diagnosis not present

## 2016-12-29 NOTE — Assessment & Plan Note (Signed)
Her duplex shows no obvious stenosis throughout the fistula with the basilic vein being the main outflow from the proximal forearm moving centrally.  The hematoma does not have any active bleeding or pseudoaneurysm component on duplex today. This has required previous intervention and given the findings today, I will just plan to see her back in about 6 months.  I do not think any intervention is warranted or necessary at this point.  She will contact my office with any problems in the interim

## 2016-12-29 NOTE — Progress Notes (Signed)
MRN : [id number]  Sharon [name] is a 83 y.o. (09-Apr-1926) female who presents with chief complaint of  Chief Complaint  Patient presents with  . Follow-up    6 month HDA f/u  .  History of Present Illness: Patient returns today in follow up of her dialysis access.  She got a fair bit of bruising and an apparent infiltration from her access yesterday, but by enlarge the access has been working quite well.  Her duplex shows no obvious stenosis throughout the fistula with the basilic vein being the main outflow from the proximal forearm moving centrally.  The hematoma does not have any active bleeding or pseudoaneurysm component on duplex today.  Current Outpatient Medications  Medication Sig Dispense Refill  . acetaminophen (TYLENOL) 325 MG tablet Take 2 tablets (650 mg total) by mouth every 4 (four) hours as needed for mild pain or moderate pain.    Marland Kitchen aspirin 325 MG tablet Take by mouth.    . diazepam (VALIUM) 5 MG tablet     . diphenoxylate-atropine (LOMOTIL) 2.5-0.025 MG tablet TAKE 1 TABLET BY MOUTH FOUR TIMES DAILY AS NEEDED FOR DIARRHEA OR LOOSE STOOLS (Patient taking differently: TAKE 1 TABLET BY MOUTH every 6 hours AS NEEDED FOR DIARRHEA OR LOOSE STOOLS) 30 tablet 5  . levothyroxine (SYNTHROID, LEVOTHROID) 25 MCG tablet Take 1 tablet (25 mcg total) by mouth daily. (Patient taking differently: Take 25 mcg by mouth daily. (0800)) 90 tablet 1  . lidocaine-prilocaine (EMLA) cream Apply 1 application topically every Monday, Wednesday, and Friday with hemodialysis. (1100) apply to right arm dialysis access pre-dialysis wrap with saran wrap after applying.    . meclizine (ANTIVERT) 25 MG tablet Take by mouth.    . metoCLOPramide (REGLAN) 5 MG tablet Take 1 tablet (5 mg total) by mouth every 8 (eight) hours as needed (vertigo). 15 tablet 0  . metoprolol succinate (TOPROL XL) 25 MG 24 hr tablet TAKE 1/2 BY MOUTH DAILY 30 tablet 11  . metoprolol tartrate (LOPRESSOR) 25 MG tablet     .  mupirocin ointment (BACTROBAN) 2 % Apply 1 application topically 2 (two) times daily. 22 g 0  . potassium chloride (K-DUR,KLOR-CON) 10 MEQ tablet Take by mouth.    . sertraline (ZOLOFT) 50 MG tablet TAKE 3 TABLETS(150 MG) BY MOUTH DAILY 90 tablet 2  . traMADol (ULTRAM) 50 MG tablet Take 1 tablet (50 mg total) by mouth every 12 (twelve) hours as needed for severe pain. 10 tablet 0  . Vitamin D, Ergocalciferol, (DRISDOL) 50000 units CAPS capsule Take 50,000 Units by mouth every Monday. (0900)     No current facility-administered medications for this visit.     Past Medical History:  Diagnosis Date  . Arthritis   . CAD (coronary artery disease)   . Cancer ([place])    skin  . Cardiomyopathy ([place])   . Depression   . IBS (irritable bowel syndrome) 2010  . Kidney failure July 2012   Hemodialysis 3xweek  . Kidney failure   . Meniere disease   . Meniere's disease   . Myocardial infarction ([place])   . OSA (obstructive sleep apnea)    CPAP  . Peritoneal dialysis status ([place])   . Presence of permanent cardiac pacemaker     Past Surgical History:  Procedure Laterality Date  . [place]  . ANUS SURGERY  2010  . AV FISTULA PLACEMENT Right 04/15/2016   Procedure: ARTERIOVENOUS (AV) FISTULA CREATION ( RADIOCEPHALIC ) STAGE 2;  Surgeon: Algernon Huxley, MD;  Location: [facility];  Service: Vascular;  Laterality: Right;  . CATARACT EXTRACTION  2006, 2011  . CHOLECYSTECTOMY  01/2008  . DIALYSIS/PERMA CATHETER REMOVAL N/A 08/20/2016   Procedure: Dialysis/Perma Catheter Removal;  Surgeon: Algernon Huxley, MD;  Location: [place] [facility];  Service: Cardiovascular;  Laterality: N/A;  . EYE SURGERY     cataracts bilateral  . FEMUR IM NAIL Left 12/15/2015   Procedure: INTRAMEDULLARY (IM) RETROGRADE FEMORAL NAILING;  Surgeon: Oletta Cohn, DO;  Location: [facility];  Service: Orthopedics;  Laterality: Left;  . HIP PINNING,CANNULATED Left 05/10/2016   Procedure: CANNULATED HIP PINNING;   Surgeon: Earnestine Leys, MD;  Location: [facility];  Service: Orthopedics;  Laterality: Left;  . INSERTION OF DIALYSIS CATHETER  07/2010  . JOINT REPLACEMENT     left knee  . MINOR REMOVAL OF PERITONEAL DIALYSIS CATHETER  04/15/2016   Procedure: MINOR REMOVAL OF PERITONEAL DIALYSIS CATHETER;  Surgeon: Algernon Huxley, MD;  Location: [facility];  Service: Vascular;;  . PACEMAKER INSERTION  2006  . PERIPHERAL VASCULAR CATHETERIZATION N/A 12/18/2015   Procedure: Dialysis/Perma Catheter Insertion;  Surgeon: Katha Cabal, MD;  Location: [place] [facility];  Service: Cardiovascular;  Laterality: N/A;  . TOTAL KNEE ARTHROPLASTY  2008   LEFT/Dr [place]    Social History Social History   Tobacco Use  . Smoking status: Former Smoker    Last attempt to quit: 03/31/1948    Years since quitting: 68.7  . Smokeless tobacco: Never Used  Substance Use Topics  . Alcohol use: No  . Drug use: No     Family History Family History  Problem Relation Age of Onset  . Cancer Mother        ? ovarian - sarcoma  . Cancer Father        Skin cancer  . Diabetes Sister   . COPD Sister   . Depression Sister   . Cancer Sister        Lung - 67 yrs old     Allergies  Allergen Reactions  . Statins Other (See Comments)    Muscle weakness Muscle weakness severe  . Codeine Sulfate Nausea And Vomiting  . Codeine Other (See Comments)    GI UPSET  . Effexor [Venlafaxine] Nausea Only  . Ezetimibe-Simvastatin Other (See Comments)    Muscle weakness     REVIEW OF SYSTEMS (Negative unless checked)  Constitutional: _0 Weight loss  _1 Fever  _2 Chills Cardiac: _3 Chest pain   _4 Chest pressure   _5 Palpitations   _6 Shortness of breath when laying flat   _7 Shortness of breath at rest   _8 Shortness of breath with exertion. Vascular:  _9 Pain in legs with walking   _10 Pain in legs at rest   _11 Pain in legs when laying flat   _12 Claudication   _13 Pain in feet when walking  _14 Pain in feet at rest  _15 Pain in feet when laying flat    _16 History of DVT   _17 Phlebitis   _18 Swelling in legs   _19 Varicose veins   _20 Non-healing ulcers Pulmonary:   _21 Uses home oxygen   _22 Productive cough   _23 Hemoptysis   _24 Wheeze  _25 COPD   _26 Asthma Neurologic:  _27 Dizziness  _28 Blackouts   _29 Seizures   _30 History of stroke   _31 History of TIA  _32 Aphasia   _33 Temporary blindness   _34 Dysphagia   _35 Weakness or numbness in arms   _36 Weakness or numbness in legs Musculoskeletal:  _37 Arthritis   _38 Joint swelling   _39 Joint pain   _40 Low back  pain Hematologic:  _0 Easy bruising  _1 Easy bleeding   _2 Hypercoagulable state   _3 Anemic   Gastrointestinal:  _4 Blood in stool   _5 Vomiting blood  _6 Gastroesophageal reflux/heartburn   _7 Abdominal pain Genitourinary:  _8 Chronic kidney disease   _9 Difficult urination  _10 Frequent urination  _11 Burning with urination   _12 Hematuria Skin:  _13 Rashes   _14 Ulcers   _15 Wounds Psychological:  _16 History of anxiety   _17  History of major depression.  Physical Examination  BP 133/65 (BP Location: Left Arm, Patient Position: Sitting)   Pulse 85   Resp 16   Ht _18  (1.6 m)   Wt 98 lb (44.5 kg)   BMI 17.36 kg/m  Gen:  WD/WN, NAD.  Appears younger than stated age Head: [place]/AT, No temporalis wasting. Ear/Nose/Throat: Hearing grossly intact, nares w/o erythema or drainage, trachea midline Eyes: Conjunctiva clear. Sclera non-icteric Neck: Supple.  No JVD.  Pulmonary:  Good air movement, no use of accessory muscles.  Cardiac: Irregular Vascular: Moderate bruising in the forearm with a good thrill in the left radiocephalic AV fistula. Vessel Right Left  Radial Palpable Palpable                                    Musculoskeletal: M/S 5/5 throughout.  No deformity or atrophy.  No edema. Neurologic: Sensation grossly intact in extremities.  Symmetrical.  Speech is fluent.  Psychiatric: Judgment intact, Mood & affect appropriate for pt's clinical situation. Dermatologic: No rashes or ulcers noted.  No cellulitis or open  wounds.       Labs Recent Results (from the past 2160 hour(s))  CBC     Status: Abnormal   Collection Time: 10/02/16 11:05 AM  Result Value Ref Range   WBC 6.6 3.6 - 11.0 K/uL   RBC 4.15 3.80 - 5.20 MIL/uL   Hemoglobin 11.9 (L) 12.0 - 16.0 g/dL   HCT 35.7 35.0 - 47.0 %   MCV 86.0 80.0 - 100.0 fL   MCH 28.7 26.0 - 34.0 pg   MCHC 33.3 32.0 - 36.0 g/dL   RDW 16.5 (H) 11.5 - 14.5 %   Platelets 311 150 - 440 K/uL  Comprehensive metabolic panel     Status: Abnormal   Collection Time: 10/02/16 11:05 AM  Result Value Ref Range   Sodium 141 135 - 145 mmol/L   Potassium 4.1 3.5 - 5.1 mmol/L   Chloride 102 101 - 111 mmol/L   CO2 27 22 - 32 mmol/L   Glucose, Bld 101 (H) 65 - 99 mg/dL   BUN 25 (H) 6 - 20 mg/dL   Creatinine, Ser 2.29 (H) 0.44 - 1.00 mg/dL   Calcium 9.3 8.9 - 10.3 mg/dL   Total Protein 6.8 6.5 - 8.1 g/dL   Albumin 3.4 (L) 3.5 - 5.0 g/dL   AST 34 15 - 41 U/L   ALT 20 14 - 54 U/L   Alkaline Phosphatase 152 (H) 38 - 126 U/L   Total Bilirubin 1.5 (H) 0.3 - 1.2 mg/dL   GFR calc non Af Amer 18 (L) >60 mL/min   GFR calc Af Amer 21 (L) >60 mL/min    Comment: (NOTE) The eGFR has been calculated using the CKD EPI equation. This calculation has not been validated in all clinical situations. eGFR's persistently <60 mL/min signify possible Chronic Kidney Disease.    Anion gap 12 5 - 15  CBC     Status: Abnormal   Collection Time:  10/11/16  1:00 PM  Result Value Ref Range   WBC 6.0 3.6 - 11.0 K/uL   RBC 4.25 3.80 - 5.20 MIL/uL   Hemoglobin 12.1 12.0 - 16.0 g/dL   HCT 36.8 35.0 - 47.0 %   MCV 86.5 80.0 - 100.0 fL   MCH 28.6 26.0 - 34.0 pg   MCHC 33.0 32.0 - 36.0 g/dL   RDW 17.7 (H) 11.5 - 14.5 %   Platelets 288 150 - 440 K/uL  Basic metabolic panel     Status: Abnormal   Collection Time: 10/11/16  1:00 PM  Result Value Ref Range   Sodium 137 135 - 145 mmol/L   Potassium 3.2 (L) 3.5 - 5.1 mmol/L   Chloride 97 (L) 101 - 111 mmol/L   CO2 30 22 - 32 mmol/L   Glucose,  Bld 114 (H) 65 - 99 mg/dL   BUN 24 (H) 6 - 20 mg/dL   Creatinine, Ser 2.48 (H) 0.44 - 1.00 mg/dL   Calcium 9.4 8.9 - 10.3 mg/dL   GFR calc non Af Amer 16 (L) >60 mL/min   GFR calc Af Amer 19 (L) >60 mL/min    Comment: (NOTE) The eGFR has been calculated using the CKD EPI equation. This calculation has not been validated in all clinical situations. eGFR's persistently <60 mL/min signify possible Chronic Kidney Disease.    Anion gap 10 5 - 15  HIV antibody     Status: None   Collection Time: 11/10/16 11:57 AM  Result Value Ref Range   HIV Screen 4th Generation wRfx Non Reactive Non Reactive    Comment: (NOTE) Performed At: [facility] [place], [place] [id number] Lindon Romp MD [id number]   Gamma GT     Status: Abnormal   Collection Time: 11/10/16 11:57 AM  Result Value Ref Range   GGT 87 (H) 7 - 50 U/L    Comment: Performed at [facility], [place] [address], [place], [place] [address]  Protime-INR     Status: None   Collection Time: 11/10/16 11:57 AM  Result Value Ref Range   Prothrombin Time 14.5 11.4 - 15.2 seconds   INR 1.14   AFP tumor marker     Status: None   Collection Time: 11/10/16 11:57 AM  Result Value Ref Range   AFP, Serum, Tumor Marker 1.4 0.0 - 8.3 ng/mL    Comment: (NOTE) Roche ECLIA methodology Performed At: [facility] [place], [place] [id number] Lindon Romp MD [id number]   Ceruloplasmin     Status: None   Collection Time: 11/10/16 11:57 AM  Result Value Ref Range   Ceruloplasmin 28.4 19.0 - 39.0 mg/dL    Comment: (NOTE) Performed At: [facility] [place], [place] [id number] Lindon Romp MD [id number]   Ferritin     Status: Abnormal   Collection Time: 11/10/16 11:57 AM  Result Value Ref Range   Ferritin 820 (H) 11 - 307 ng/mL  Mitochondrial antibodies     Status: None   Collection Time: 11/10/16 11:57 AM  Result Value Ref Range   Mitochondrial M2  Ab, IgG 2.9 0.0 - 20.0 Units    Comment: (NOTE)                                Negative    0.0 - 20.0  Equivocal  20.1 - 24.9                                Positive         >24.9 Mitochondrial (M2) Antibodies are found in 90-96% of patients with primary biliary cirrhosis. Performed At: [facility] [place], [place] [id number] Lindon Romp MD [id number]   Anti-smooth muscle antibody, IgG     Status: None   Collection Time: 11/10/16 11:57 AM  Result Value Ref Range   F-Actin IgG 9 0 - 19 Units    Comment: (NOTE)                 Negative                     0 - 19                 Weak positive               20 - 30                 Moderate to strong positive     >30 Actin Antibodies are found in 52-85% of patients with autoimmune hepatitis or chronic active hepatitis and in 22% of patients with primary biliary cirrhosis. Performed At: [facility] [place], [place] [id number] Lindon Romp MD [id number]   Miscellaneous [facility] test (send-out)     Status: None   Collection Time: 11/10/16 11:57 AM  Result Value Ref Range   [facility] test code [id number]    [facility] test name ANA    Misc [facility] result COMMENT     Comment: (NOTE) Test Ordered: [id number] Immunoglobulin A, Qn, Serum Immunoglobulin A, Qn, Serum    187              mg/dL    BN     Reference Range: 64-422                                Performed At: [facility] [place], [place] [id number] Lindon Romp MD [id number]   Tissue transglutaminase, IgA     Status: None   Collection Time: 11/10/16 11:57 AM  Result Value Ref Range   Tissue Transglutaminase Ab, IgA <2 0 - 3 U/mL    Comment: (NOTE)                              Negative        0 -  3                              Weak Positive   4 - 10                              Positive           >10 Tissue Transglutaminase (tTG) has been identified as the  endomysial antigen.  Studies have demonstr- ated that endomysial IgA antibodies have over 99% specificity for gluten sensitive enteropathy. Performed At: [facility] [place], [place] [id number] Lindon Romp MD [id number]   Tissue transglutaminase, IgG  Status: None   Collection Time: 11/10/16 11:57 AM  Result Value Ref Range   Tissue Transglut Ab <2 0 - 5 U/mL    Comment: (NOTE)                              Negative        0 - 5                              Weak Positive   6 - 9                              Positive           >9 Performed At: [facility] [place], [place] [id number] Lindon Romp MD [id number]   AntiMicrosomal Ab-Liver / Kidney     Status: None   Collection Time: 11/10/16 11:57 AM  Result Value Ref Range   LKM1 Ab 2.2 0.0 - 20.0 Units    Comment: (NOTE)                                Negative    0.0 - 20.0                                Equivocal  20.1 - 24.9                                Positive         >24.9 LKM type 1 antibodies are detected in patients with autoimmune hepatitis type 2 and in up to 8% of patients with chronic HCV infection. Performed At: [facility] [place], [place] [id number] Lindon Romp MD [id number]   FANA Staining Patterns     Status: None   Collection Time: 11/10/16 11:57 AM  Result Value Ref Range   Homogeneous Pattern 1:80    Note: Comment     Comment: (NOTE) A positive ANA result may occur in healthy individuals (low titer) or be associated with a variety of diseases.  See interpretation chart which is not all inclusive: Pattern      Antigen Detected  Suggested Disease Association -----------  ----------------  ----------------------------- Homogeneous  DNA(ds,ss),       SLE - High titers             Nucleosomes,             Histones          Drug-induced SLE -----------  ----------------  ----------------------------- Speckled      Sm, RNP, SCL-70,  SLE,MCTD,PSS (diffuse form),             SS-A/SS-B         Sjogrens -----------  ----------------  ----------------------------- Nucleolar    SCL-70, PM-1/SCL  High titers Scleroderma,                               PM/DM -----------  ----------------  ----------------------------- Centromere   Centromere        PSS (limited form) w/Crest  syndrome variable -----------  ----------------  ----------------------------- Nuclear Dot  Sp100,p80-c oilin  Primary Biliary Cirrhosis -----------  ----------------  ----------------------------- Nuclear      GP210,            Primary Biliary Cirrhosis Membrane     lamin A,B,C -----------  ----------------  ----------------------------- Performed At: [facility] [place], [place] [id number] Lindon Romp MD [id number]   Miscellaneous [facility] test (send-out)     Status: None   Collection Time: 11/10/16 12:03 PM  Result Value Ref Range   [facility] test code [id number]    [facility] test name ANA    Misc [facility] result COMMENT     Comment: (NOTE) Test Ordered: [id number] Antinuclear Antibodies Direct ANA Direct                     Negative                  BN     Reference Range: Negative                              Performed At: [facility] [place], [place] [id number] Lindon Romp MD [id number]   HCV RNA quant     Status: None   Collection Time: 11/10/16 12:14 PM  Result Value Ref Range   HCV Quantitative HCV Not Detected >50 IU/mL   Test Information Comment     Comment: (NOTE) The quantitative range of this assay is 15 IU/mL to 100 million IU/mL. Performed At: [facility] [place], [place] [id number] Lindon Romp MD [id number]   Miscellaneous [facility] test (send-out)     Status: None   Collection Time: 11/10/16 12:14 PM  Result Value Ref Range   [facility] test code [id number]    [facility] test name ALPHA 1  ANTITRYPSIN    Misc [facility] result COMMENT     Comment: (NOTE) Test Ordered: [id number] Alpha-1-Antitrypsin, Serum Alpha-1-Antitrypsin, Serum     184              mg/dL    BN     Reference Range: 90-200                                Performed At: [facility] [address] [place], [place] [id number] Lindon Romp MD [id number]   Pancreatic elastase, fecal     Status: None   Collection Time: 11/12/16 11:20 AM  Result Value Ref Range   Pancreatic Elastase-1, Stool >500 >200 ug Elast./g    Comment: (NOTE)       Severe Pancreatic Insufficiency:          <100       Moderate Pancreatic Insufficiency:   100 - 200       Normal:                                   >200 Performed At: [facility] [address] [place], [place] [id number] Lindon Romp MD [id number]   Gastrointestinal Panel by PCR , Stool     Status: None   Collection Time: 11/12/16 11:20 AM  Result Value Ref Range   Campylobacter species NOT DETECTED NOT DETECTED   Plesimonas shigelloides NOT DETECTED NOT DETECTED  Salmonella species NOT DETECTED NOT DETECTED   Yersinia enterocolitica NOT DETECTED NOT DETECTED   Vibrio species NOT DETECTED NOT DETECTED   Vibrio cholerae NOT DETECTED NOT DETECTED   Enteroaggregative E coli (EAEC) NOT DETECTED NOT DETECTED   Enteropathogenic E coli (EPEC) NOT DETECTED NOT DETECTED   Enterotoxigenic E coli (ETEC) NOT DETECTED NOT DETECTED   Shiga like toxin producing E coli (STEC) NOT DETECTED NOT DETECTED   Shigella/Enteroinvasive E coli (EIEC) NOT DETECTED NOT DETECTED   Cryptosporidium NOT DETECTED NOT DETECTED   Cyclospora cayetanensis NOT DETECTED NOT DETECTED   Entamoeba histolytica NOT DETECTED NOT DETECTED   Giardia lamblia NOT DETECTED NOT DETECTED   Adenovirus F40/41 NOT DETECTED NOT DETECTED   Astrovirus NOT DETECTED NOT DETECTED   Norovirus GI/GII NOT DETECTED NOT DETECTED   Rotavirus A NOT DETECTED NOT DETECTED   Sapovirus (I, II, IV, and V) NOT  DETECTED NOT DETECTED    Radiology Ct Head Wo Contrast  Result Date: 12/03/2016 CLINICAL DATA:  Diplopia.  End-stage renal disease EXAM: CT HEAD WITHOUT CONTRAST TECHNIQUE: Contiguous axial images were obtained from the base of the skull through the vertex without intravenous contrast. COMPARISON:  CT head 10/11/2016 FINDINGS: Brain: Generalized atrophy. Negative for hydrocephalus. Chronic microvascular ischemic change in the white matter is stable. No acute infarct, hemorrhage, or mass. Vascular: Negative for hyperdense vessel Skull: Negative Sinuses/Orbits: Negative Other: None IMPRESSION: Atrophy and chronic microvascular ischemia.  No acute abnormality. Electronically Signed   By: Franchot Gallo M.D.   On: 12/03/2016 14:03      Assessment/Plan  Benign essential hypertension blood pressure control important in reducing the progression of atherosclerotic disease. On appropriate oral medications.   ESRD on dialysis [facility]) Her duplex shows no obvious stenosis throughout the fistula with the basilic vein being the main outflow from the proximal forearm moving centrally.  The hematoma does not have any active bleeding or pseudoaneurysm component on duplex today. This has required previous intervention and given the findings today, I will just plan to see her back in about 6 months.  I do not think any intervention is warranted or necessary at this point.  She will contact my office with any problems in the interim    Leotis Pain, MD  12/29/2016 12:58 PM    This note was created with Dragon medical transcription system.  Any errors from dictation are purely unintentional

## 2016-12-29 NOTE — Patient Instructions (Signed)
Dialysis Dialysis is a procedure that replaces some of the work healthy kidneys do. It is done when you lose about 85-90% of your kidney function. It may also be done earlier if your symptoms may be improved by dialysis. During dialysis, wastes, salt, and extra water are removed from the blood, and the levels of certain chemicals in the blood (such as potassium) are maintained. Dialysis is done in sessions. Dialysis sessions are continued until the kidneys get better. If the kidneys cannot get better, such as in end-stage kidney disease, dialysis is continued for life or until you receive a new kidney (kidney transplant). There are two types of dialysis: hemodialysis and peritoneal dialysis. What is hemodialysis? Hemodialysis is a type of dialysis in which a machine called a dialyzer is used to filter the blood. Before beginning hemodialysis, you will have surgery to create a site where blood can be removed from the body and returned to the body (vascular access). There are three types of vascular accesses:  Arteriovenous fistula. To create this type of access, an artery is connected to a vein (usually in the arm). A fistula takes 1-6 months to develop after surgery. If it develops properly, it usually lasts longer than the other types of vascular accesses. It is also less likely to become infected and cause blood clots.  Arteriovenous graft. To create this type of access, an artery and a vein in the arm are connected with a tube. A graft may be used within 2-3 weeks of surgery.  A venous catheter. To create this type of access, a thin, flexible tube (catheter) is placed in a large vein in your neck, chest, or groin. A catheter may be used right away. It is usually used as a temporary access when dialysis needs to begin immediately.  During hemodialysis, blood leaves the body through your access. It travels through a tube to the dialyzer, where it is filtered. The blood then returns to your body through  another tube. Hemodialysis is usually performed by a health care provider at a hospital or dialysis center three times a week. Visits last about 3-4 hours. It may also be performed with the help of another person at home with training. What is peritoneal dialysis? Peritoneal dialysis is a type of dialysis in which the thin lining of the abdomen (peritoneum) is used as a filter. Before beginning peritoneal dialysis, you will have surgery to place a catheter in your abdomen. The catheter will be used to transfer a fluid called dialysate to and from your abdomen. At the start of a session, your abdomen is filled with dialysate. During the session, wastes, salt, and extra water in the blood pass through the peritoneum and into the dialysate. The dialysate is drained from the body at the end of the session. The process of filling and draining the dialysate is called an exchange. Exchanges are repeated until you have used up all the dialysate for the day. Peritoneal dialysis may be performed by you at home or at almost any other location. It is done every day. You may need up to five exchanges a day. The amount of time the dialysate is in your body between exchanges is called a dwell. The dwell depends on the number of exchanges needed and the characteristics of the peritoneum. It usually varies from 1.5-3 hours. You may go about your day normally between exchanges. Alternately, the exchanges may be done at night while you sleep, using a machine called a cycler. Which type of   dialysis should I choose? Both hemodialysis and peritoneal dialysis have advantages and disadvantages. Talk to your health care provider about which type of dialysis would be best for you. Your lifestyle and preferences should be considered along with your medical condition. In some cases, only one type of dialysis may be an option. Advantages of hemodialysis  It is done less often than peritoneal dialysis.  Someone else can do the  dialysis for you.  If you go to a dialysis center, your health care provider will be able to recognize any problems right away.  If you go to a dialysis center, you can interact with others who are having dialysis. This can provide you with emotional support.  Disadvantages of hemodialysis  Hemodialysis may cause cramps and low blood pressure. It may leave you feeling tired on the days you have the treatment.  If you go to a dialysis center, you will need to make weekly appointments and work around the center's schedule.  You will need to take extra care when traveling. If you go to a dialysis center, you will need to make special arrangements to visit a dialysis center near your destination. If you are having treatments at home, you will need to take the dialyzer with you to your destination.  You will need to avoid more foods than you would need to avoid on peritoneal dialysis.  Advantages of peritoneal dialysis  It is less likely than hemodialysis to cause cramps and low blood pressure.  You may do exchanges on your own wherever you are, including when you travel.  You do not need to avoid as many foods as you do on hemodialysis.  Disadvantages of peritoneal dialysis  It is done more often than hemodialysis.  Performing peritoneal dialysis requires you to have dexterity of the hands. You must also be able to lift bags.  You will have to learn sterilization techniques. You will need to practice them every day to reduce the risk of infection.  What changes will I need to make to my diet during dialysis? Both hemodialysis and peritoneal dialysis require you to make some changes to your diet. For example, you will need to limit your intake of foods high in the minerals phosphorus and potassium. You will also need to limit your fluid intake. Your dietitian can help you plan meals. A good meal plan can improve your dialysis and your health. What should I expect when beginning  dialysis? Adjusting to the dialysis treatment, schedule, and diet can take some time. You may need to stop working and may not be able to do some of the things you normally do. You may feel anxious or depressed when beginning dialysis. Eventually, many people feel better overall because of dialysis. Some people are able to return to work after making some changes, such as reducing work intensity. Where can I find more information?  National Kidney Foundation: www.kidney.org  American Association of Kidney Patients: www.aakp.org  American Kidney Fund: www.kidneyfund.org This information is not intended to replace advice given to you by your health care provider. Make sure you discuss any questions you have with your health care provider. Document Released: 04/04/2002 Document Revised: 06/20/2015 Document Reviewed: 03/08/2012 Elsevier Interactive Patient Education  2017 Elsevier Inc.  

## 2016-12-29 NOTE — Assessment & Plan Note (Signed)
blood pressure control important in reducing the progression of atherosclerotic disease. On appropriate oral medications.  

## 2016-12-30 DIAGNOSIS — Z992 Dependence on renal dialysis: Secondary | ICD-10-CM | POA: Diagnosis not present

## 2016-12-30 DIAGNOSIS — N2581 Secondary hyperparathyroidism of renal origin: Secondary | ICD-10-CM | POA: Diagnosis not present

## 2016-12-30 DIAGNOSIS — N186 End stage renal disease: Secondary | ICD-10-CM | POA: Diagnosis not present

## 2016-12-30 DIAGNOSIS — D509 Iron deficiency anemia, unspecified: Secondary | ICD-10-CM | POA: Diagnosis not present

## 2017-01-01 ENCOUNTER — Emergency Department: Payer: Medicare Other

## 2017-01-01 ENCOUNTER — Other Ambulatory Visit: Payer: Self-pay

## 2017-01-01 ENCOUNTER — Inpatient Hospital Stay
Admission: EM | Admit: 2017-01-01 | Discharge: 2017-01-08 | DRG: 166 | Disposition: A | Discharge status: Skilled Nursing Facility | Payer: Medicare Other | Attending: Internal Medicine | Admitting: Internal Medicine

## 2017-01-01 ENCOUNTER — Encounter: Payer: Self-pay | Admitting: Emergency Medicine

## 2017-01-01 DIAGNOSIS — I5022 Chronic systolic (congestive) heart failure: Secondary | ICD-10-CM | POA: Diagnosis not present

## 2017-01-01 DIAGNOSIS — Z9841 Cataract extraction status, right eye: Secondary | ICD-10-CM

## 2017-01-01 DIAGNOSIS — R079 Chest pain, unspecified: Secondary | ICD-10-CM | POA: Diagnosis not present

## 2017-01-01 DIAGNOSIS — H8109 Meniere's disease, unspecified ear: Secondary | ICD-10-CM | POA: Diagnosis present

## 2017-01-01 DIAGNOSIS — Z818 Family history of other mental and behavioral disorders: Secondary | ICD-10-CM

## 2017-01-01 DIAGNOSIS — F419 Anxiety disorder, unspecified: Secondary | ICD-10-CM | POA: Diagnosis present

## 2017-01-01 DIAGNOSIS — I2699 Other pulmonary embolism without acute cor pulmonale: Principal | ICD-10-CM | POA: Diagnosis present

## 2017-01-01 DIAGNOSIS — I132 Hypertensive heart and chronic kidney disease with heart failure and with stage 5 chronic kidney disease, or end stage renal disease: Secondary | ICD-10-CM | POA: Diagnosis present

## 2017-01-01 DIAGNOSIS — Z79899 Other long term (current) drug therapy: Secondary | ICD-10-CM

## 2017-01-01 DIAGNOSIS — Z7189 Other specified counseling: Secondary | ICD-10-CM

## 2017-01-01 DIAGNOSIS — Z96652 Presence of left artificial knee joint: Secondary | ICD-10-CM | POA: Diagnosis present

## 2017-01-01 DIAGNOSIS — Z79891 Long term (current) use of opiate analgesic: Secondary | ICD-10-CM

## 2017-01-01 DIAGNOSIS — Z885 Allergy status to narcotic agent status: Secondary | ICD-10-CM

## 2017-01-01 DIAGNOSIS — E876 Hypokalemia: Secondary | ICD-10-CM | POA: Diagnosis present

## 2017-01-01 DIAGNOSIS — R1310 Dysphagia, unspecified: Secondary | ICD-10-CM | POA: Diagnosis present

## 2017-01-01 DIAGNOSIS — Z833 Family history of diabetes mellitus: Secondary | ICD-10-CM

## 2017-01-01 DIAGNOSIS — I429 Cardiomyopathy, unspecified: Secondary | ICD-10-CM | POA: Diagnosis present

## 2017-01-01 DIAGNOSIS — K589 Irritable bowel syndrome without diarrhea: Secondary | ICD-10-CM | POA: Diagnosis present

## 2017-01-01 DIAGNOSIS — Z9049 Acquired absence of other specified parts of digestive tract: Secondary | ICD-10-CM

## 2017-01-01 DIAGNOSIS — I252 Old myocardial infarction: Secondary | ICD-10-CM

## 2017-01-01 DIAGNOSIS — W19XXXA Unspecified fall, initial encounter: Secondary | ICD-10-CM | POA: Diagnosis not present

## 2017-01-01 DIAGNOSIS — R55 Syncope and collapse: Secondary | ICD-10-CM | POA: Diagnosis present

## 2017-01-01 DIAGNOSIS — Z825 Family history of asthma and other chronic lower respiratory diseases: Secondary | ICD-10-CM

## 2017-01-01 DIAGNOSIS — R52 Pain, unspecified: Secondary | ICD-10-CM

## 2017-01-01 DIAGNOSIS — R749 Abnormal serum enzyme level, unspecified: Secondary | ICD-10-CM | POA: Diagnosis present

## 2017-01-01 DIAGNOSIS — E039 Hypothyroidism, unspecified: Secondary | ICD-10-CM | POA: Diagnosis present

## 2017-01-01 DIAGNOSIS — Z87891 Personal history of nicotine dependence: Secondary | ICD-10-CM

## 2017-01-01 DIAGNOSIS — N2581 Secondary hyperparathyroidism of renal origin: Secondary | ICD-10-CM | POA: Diagnosis present

## 2017-01-01 DIAGNOSIS — R531 Weakness: Secondary | ICD-10-CM | POA: Diagnosis present

## 2017-01-01 DIAGNOSIS — S41112A Laceration without foreign body of left upper arm, initial encounter: Secondary | ICD-10-CM | POA: Diagnosis not present

## 2017-01-01 DIAGNOSIS — R0789 Other chest pain: Secondary | ICD-10-CM | POA: Diagnosis not present

## 2017-01-01 DIAGNOSIS — F329 Major depressive disorder, single episode, unspecified: Secondary | ICD-10-CM | POA: Diagnosis present

## 2017-01-01 DIAGNOSIS — Z888 Allergy status to other drugs, medicaments and biological substances status: Secondary | ICD-10-CM

## 2017-01-01 DIAGNOSIS — J90 Pleural effusion, not elsewhere classified: Secondary | ICD-10-CM

## 2017-01-01 DIAGNOSIS — O2230 Deep phlebothrombosis in pregnancy, unspecified trimester: Secondary | ICD-10-CM

## 2017-01-01 DIAGNOSIS — M1990 Unspecified osteoarthritis, unspecified site: Secondary | ICD-10-CM | POA: Diagnosis present

## 2017-01-01 DIAGNOSIS — N186 End stage renal disease: Secondary | ICD-10-CM | POA: Diagnosis not present

## 2017-01-01 DIAGNOSIS — D509 Iron deficiency anemia, unspecified: Secondary | ICD-10-CM | POA: Diagnosis not present

## 2017-01-01 DIAGNOSIS — Z85828 Personal history of other malignant neoplasm of skin: Secondary | ICD-10-CM

## 2017-01-01 DIAGNOSIS — S0990XA Unspecified injury of head, initial encounter: Secondary | ICD-10-CM | POA: Diagnosis not present

## 2017-01-01 DIAGNOSIS — Y92129 Unspecified place in nursing home as the place of occurrence of the external cause: Secondary | ICD-10-CM

## 2017-01-01 DIAGNOSIS — Z7982 Long term (current) use of aspirin: Secondary | ICD-10-CM

## 2017-01-01 DIAGNOSIS — Z992 Dependence on renal dialysis: Secondary | ICD-10-CM

## 2017-01-01 DIAGNOSIS — I2510 Atherosclerotic heart disease of native coronary artery without angina pectoris: Secondary | ICD-10-CM | POA: Diagnosis present

## 2017-01-01 DIAGNOSIS — Z808 Family history of malignant neoplasm of other organs or systems: Secondary | ICD-10-CM

## 2017-01-01 DIAGNOSIS — G894 Chronic pain syndrome: Secondary | ICD-10-CM | POA: Diagnosis present

## 2017-01-01 DIAGNOSIS — G4733 Obstructive sleep apnea (adult) (pediatric): Secondary | ICD-10-CM | POA: Diagnosis present

## 2017-01-01 DIAGNOSIS — R51 Headache: Secondary | ICD-10-CM | POA: Diagnosis not present

## 2017-01-01 DIAGNOSIS — Z8701 Personal history of pneumonia (recurrent): Secondary | ICD-10-CM

## 2017-01-01 DIAGNOSIS — D631 Anemia in chronic kidney disease: Secondary | ICD-10-CM | POA: Diagnosis present

## 2017-01-01 DIAGNOSIS — M810 Age-related osteoporosis without current pathological fracture: Secondary | ICD-10-CM | POA: Diagnosis present

## 2017-01-01 DIAGNOSIS — Z950 Presence of cardiac pacemaker: Secondary | ICD-10-CM

## 2017-01-01 DIAGNOSIS — Z87310 Personal history of (healed) osteoporosis fracture: Secondary | ICD-10-CM

## 2017-01-01 DIAGNOSIS — R748 Abnormal levels of other serum enzymes: Secondary | ICD-10-CM | POA: Diagnosis not present

## 2017-01-01 DIAGNOSIS — Z9889 Other specified postprocedural states: Secondary | ICD-10-CM

## 2017-01-01 DIAGNOSIS — Z79890 Hormone replacement therapy (postmenopausal): Secondary | ICD-10-CM

## 2017-01-01 DIAGNOSIS — Z515 Encounter for palliative care: Secondary | ICD-10-CM

## 2017-01-01 DIAGNOSIS — Z9842 Cataract extraction status, left eye: Secondary | ICD-10-CM

## 2017-01-01 DIAGNOSIS — R1010 Upper abdominal pain, unspecified: Secondary | ICD-10-CM | POA: Diagnosis not present

## 2017-01-01 DIAGNOSIS — I248 Other forms of acute ischemic heart disease: Secondary | ICD-10-CM | POA: Diagnosis present

## 2017-01-01 DIAGNOSIS — H9190 Unspecified hearing loss, unspecified ear: Secondary | ICD-10-CM | POA: Diagnosis present

## 2017-01-01 HISTORY — DX: Disorder of kidney and ureter, unspecified: N28.9

## 2017-01-01 LAB — CBC WITH DIFFERENTIAL/PLATELET
Basophils Absolute: 0.1 10*3/uL (ref 0–0.1)
Basophils Relative: 1 %
EOS ABS: 0.2 10*3/uL (ref 0–0.7)
EOS PCT: 4 %
HCT: 31.5 % — ABNORMAL LOW (ref 35.0–47.0)
HEMOGLOBIN: 10.6 g/dL — AB (ref 12.0–16.0)
LYMPHS ABS: 1 10*3/uL (ref 1.0–3.6)
Lymphocytes Relative: 21 %
MCH: 31.5 pg (ref 26.0–34.0)
MCHC: 33.6 g/dL (ref 32.0–36.0)
MCV: 93.7 fL (ref 80.0–100.0)
MONO ABS: 0.3 10*3/uL (ref 0.2–0.9)
MONOS PCT: 6 %
NEUTROS PCT: 68 %
Neutro Abs: 3.4 10*3/uL (ref 1.4–6.5)
Platelets: 214 10*3/uL (ref 150–440)
RBC: 3.36 MIL/uL — ABNORMAL LOW (ref 3.80–5.20)
RDW: 16.5 % — AB (ref 11.5–14.5)
WBC: 5 10*3/uL (ref 3.6–11.0)

## 2017-01-01 LAB — PROTIME-INR
INR: 1.39
Prothrombin Time: 16.9 seconds — ABNORMAL HIGH (ref 11.4–15.2)

## 2017-01-01 LAB — APTT: aPTT: >160 seconds (ref 24–36)

## 2017-01-01 LAB — BASIC METABOLIC PANEL
Anion gap: 11 (ref 5–15)
BUN: 21 mg/dL — AB (ref 6–20)
CHLORIDE: 100 mmol/L — AB (ref 101–111)
CO2: 28 mmol/L (ref 22–32)
CREATININE: 1.47 mg/dL — AB (ref 0.44–1.00)
Calcium: 8.8 mg/dL — ABNORMAL LOW (ref 8.9–10.3)
GFR calc Af Amer: 36 mL/min — ABNORMAL LOW (ref >60)
GFR calc non Af Amer: 31 mL/min — ABNORMAL LOW (ref >60)
Glucose, Bld: 142 mg/dL — ABNORMAL HIGH (ref 65–99)
Potassium: 3.4 mmol/L — ABNORMAL LOW (ref 3.5–5.1)
SODIUM: 139 mmol/L (ref 135–145)

## 2017-01-01 LAB — TROPONIN I
TROPONIN I: 0.1 ng/mL — AB (ref <0.03)
Troponin I: 0.1 ng/mL (ref <0.03)

## 2017-01-01 MED ORDER — HEPARIN BOLUS VIA INFUSION
2700.0000 [IU] | Freq: Once | INTRAVENOUS | Status: AC
  Administered 2017-01-01: 2700 [IU] via INTRAVENOUS
  Filled 2017-01-01: qty 2700

## 2017-01-01 MED ORDER — SERTRALINE HCL 50 MG PO TABS
100.0000 mg | ORAL_TABLET | Freq: Every day | ORAL | Status: DC
  Administered 2017-01-02 – 2017-01-07 (×6): 100 mg via ORAL
  Filled 2017-01-01 (×6): qty 2

## 2017-01-01 MED ORDER — CEFTRIAXONE SODIUM 1 G IJ SOLR
1.0000 g | Freq: Once | INTRAMUSCULAR | Status: AC
  Administered 2017-01-01: 1 g via INTRAMUSCULAR
  Filled 2017-01-01: qty 10

## 2017-01-01 MED ORDER — ASPIRIN EC 325 MG PO TBEC
325.0000 mg | DELAYED_RELEASE_TABLET | Freq: Every day | ORAL | Status: DC
  Administered 2017-01-02: 325 mg via ORAL
  Filled 2017-01-01: qty 1

## 2017-01-01 MED ORDER — TRAMADOL HCL 50 MG PO TABS
50.0000 mg | ORAL_TABLET | Freq: Four times a day (QID) | ORAL | Status: DC | PRN
Start: 2017-01-01 — End: 2017-01-08
  Administered 2017-01-05 – 2017-01-07 (×2): 50 mg via ORAL
  Filled 2017-01-01 (×2): qty 1

## 2017-01-01 MED ORDER — HEPARIN (PORCINE) IN NACL 100-0.45 UNIT/ML-% IJ SOLN
800.0000 [IU]/h | INTRAMUSCULAR | Status: DC
  Administered 2017-01-02: 800 [IU]/h via INTRAVENOUS
  Filled 2017-01-01 (×2): qty 250

## 2017-01-01 MED ORDER — ASPIRIN 81 MG PO CHEW
324.0000 mg | CHEWABLE_TABLET | Freq: Once | ORAL | Status: AC
  Administered 2017-01-01: 324 mg via ORAL
  Filled 2017-01-01: qty 4

## 2017-01-01 MED ORDER — LIDOCAINE HCL (PF) 1 % IJ SOLN
INTRAMUSCULAR | Status: AC
  Administered 2017-01-01: 20:00:00
  Filled 2017-01-01: qty 5

## 2017-01-01 MED ORDER — ONDANSETRON HCL 4 MG/2ML IJ SOLN
4.0000 mg | Freq: Four times a day (QID) | INTRAMUSCULAR | Status: DC | PRN
  Administered 2017-01-02 (×2): 4 mg via INTRAVENOUS
  Filled 2017-01-01 (×2): qty 2

## 2017-01-01 MED ORDER — ACETAMINOPHEN 325 MG PO TABS
650.0000 mg | ORAL_TABLET | Freq: Four times a day (QID) | ORAL | Status: DC | PRN
  Administered 2017-01-02 – 2017-01-08 (×6): 650 mg via ORAL
  Filled 2017-01-01 (×6): qty 2

## 2017-01-01 MED ORDER — METOPROLOL SUCCINATE ER 25 MG PO TB24
25.0000 mg | ORAL_TABLET | Freq: Every day | ORAL | Status: DC
  Administered 2017-01-02 – 2017-01-06 (×5): 25 mg via ORAL
  Filled 2017-01-01 (×6): qty 1

## 2017-01-01 MED ORDER — METOCLOPRAMIDE HCL 5 MG PO TABS: 5.0000 mg | ORAL_TABLET | Freq: Three times a day (TID) | ORAL | Status: DC | PRN

## 2017-01-01 MED ORDER — IOPAMIDOL (ISOVUE-370) INJECTION 76%
60.0000 mL | Freq: Once | INTRAVENOUS | Status: AC | PRN
  Administered 2017-01-01: 60 mL via INTRAVENOUS

## 2017-01-01 MED ORDER — ONDANSETRON HCL 4 MG PO TABS: 4.0000 mg | ORAL_TABLET | Freq: Four times a day (QID) | ORAL | Status: DC | PRN

## 2017-01-01 MED ORDER — DEXTROSE 5 % IV SOLN
500.0000 mg | Freq: Once | INTRAVENOUS | Status: AC
  Administered 2017-01-01: 500 mg via INTRAVENOUS
  Filled 2017-01-01: qty 500

## 2017-01-01 MED ORDER — LEVOTHYROXINE SODIUM 25 MCG PO TABS
25.0000 ug | ORAL_TABLET | Freq: Every day | ORAL | Status: DC
  Administered 2017-01-02 – 2017-01-08 (×7): 25 ug via ORAL
  Filled 2017-01-01 (×7): qty 1

## 2017-01-01 MED ORDER — ACETAMINOPHEN 650 MG RE SUPP: 650.0000 mg | Freq: Four times a day (QID) | RECTAL | Status: DC | PRN

## 2017-01-01 MED ORDER — POLYETHYLENE GLYCOL 3350 17 G PO PACK: 17.0000 g | PACK | Freq: Every day | ORAL | Status: DC | PRN

## 2017-01-01 MED ORDER — ALBUTEROL SULFATE (2.5 MG/3ML) 0.083% IN NEBU: 2.5000 mg | INHALATION_SOLUTION | RESPIRATORY_TRACT | Status: DC | PRN

## 2017-01-01 MED ORDER — DIPHENOXYLATE-ATROPINE 2.5-0.025 MG PO TABS: 1.0000 | ORAL_TABLET | Freq: Four times a day (QID) | ORAL | Status: DC | PRN

## 2017-01-01 NOTE — ED Notes (Signed)
Put patient jewelery in sterile cup and place in her bag by bedside.

## 2017-01-01 NOTE — ED Provider Notes (Addendum)
[facility] Emergency Department Provider Note  ____________________________________________   I have reviewed the triage vital signs and the nursing notes. Where available I have reviewed prior notes and, if possible and indicated, outside hospital notes.    HISTORY  Chief Complaint Fall    HPI Sharon [name] is a 83 y.o. female with a history of CAD recurrent pneumonias in the past irritable bowel syndrome, as well as end-stage renal who is on Monday Wednesday Friday dialysis had full dialysis today.  She states that this morning when she got up she passed out she believes.  In any event, she does not remember falling.  She does have pain to her right chest wall after the fall.  She states she does follow some frequency but she does not remember falling this time.  She has no pain unless he touches it or moves the wrong way.  No history of PE.  She does state that she has been coughing a great deal recently.  This is only after specifically being asked.  Patient is a poor historian.  She is at her baseline according to her brother who does help.  Complains of a right-sided chest wall pain since the fall no radiation sharp worse when she touches it or changes position not tender unless she is moving, no other associated symptoms.     Past Medical History:  Diagnosis Date  . Arthritis   . CAD (coronary artery disease)   . Cancer ([facility])    skin  . Cardiomyopathy ([place])   . Depression   . IBS (irritable bowel syndrome) 2010  . Kidney failure July 2012   Hemodialysis 3xweek  . Kidney failure   . Meniere disease   . Meniere's disease   . Myocardial infarction ([place])   . OSA (obstructive sleep apnea)    CPAP  . Peritoneal dialysis status ([place])   . Presence of permanent cardiac pacemaker     Patient Active Problem List   Diagnosis Date Noted  . Vision changes 08/27/2016  . Closed fracture of neck of femur ([place]) 05/26/2016  . Hip fracture ([place]) 05/10/2016   . Falls 04/27/2016  . Left hand pain 04/27/2016  . Head injury 04/27/2016  . Age-related osteoporosis with current pathological fracture with routine healing 04/24/2016  . Recurrent falls 04/24/2016  . Mild protein-calorie malnutrition ([place]) 04/24/2016  . ESRD on dialysis ([place]) 04/10/2016  . Other fatigue 03/27/2016  . Periprosthetic fracture around internal prosthetic left knee joint 01/26/2016  . Pressure injury of skin 12/15/2015  . Closed left subtrochanteric femur fracture ([place]) 12/15/2015  . Femur fracture, left ([place]) 12/14/2015  . Meniere disease   . Loss of weight 10/21/2015  . Clinical depression 06/20/2015  . Combined fat and carbohydrate induced hyperlipemia 06/20/2015  . Dysphagia, unspecified 05/24/2015  . Shortness of breath 05/24/2015  . Imbalance 04/22/2015  . Electrical shock sensation in right posterior head 11/22/2014  . Expressive aphasia 10/04/2014  . Breathlessness on exertion 09/13/2014  . Chronic pain syndrome 05/22/2014  . Insomnia 03/13/2014  . Anxiety state 03/13/2014  . CAD (coronary artery disease) 01/15/2014  . Chronic systolic CHF (congestive heart failure), NYHA class 3 ([place]) 01/15/2014  . OSA (obstructive sleep apnea) 01/15/2014  . Chronic kidney disease (CKD), stage V ([place]) 01/15/2014  . Basal cell carcinoma of neck 11/14/2013  . DDD (degenerative disc disease), cervical 11/07/2013  . Cervical radiculitis 10/16/2013  . Benign essential hypertension 09/12/2013  . Pelvic pain in female 09/12/2013  .  Neck pain of over 3 months duration 09/12/2013  . Memory loss 09/12/2013  . Systolic heart failure, chronic ([place]) 05/12/2013  . Sinus node dysfunction ([place]) 08/01/2012  . Low back pain 08/01/2012  . Cervical spine pain 05/02/2012  . Irritable bowel syndrome 11/02/2011  . Depression 04/01/2011  . Hypertension 04/01/2011  . Menopausal disorder 04/01/2011    Past Surgical History:  Procedure Laterality Date  . [place]   . ANUS SURGERY  2010  . AV FISTULA PLACEMENT Right 04/15/2016   Procedure: ARTERIOVENOUS (AV) FISTULA CREATION ( RADIOCEPHALIC ) STAGE 2;  Surgeon: Algernon Huxley, MD;  Location: [facility] ORS;  Service: Vascular;  Laterality: Right;  . CATARACT EXTRACTION  2006, 2011  . CHOLECYSTECTOMY  01/2008  . DIALYSIS/PERMA CATHETER REMOVAL N/A 08/20/2016   Procedure: Dialysis/Perma Catheter Removal;  Surgeon: Algernon Huxley, MD;  Location: [place] [facility];  Service: Cardiovascular;  Laterality: N/A;  . EYE SURGERY     cataracts bilateral  . FEMUR IM NAIL Left 12/15/2015   Procedure: INTRAMEDULLARY (IM) RETROGRADE FEMORAL NAILING;  Surgeon: Oletta Cohn, DO;  Location: [facility] ORS;  Service: Orthopedics;  Laterality: Left;  . HIP PINNING,CANNULATED Left 05/10/2016   Procedure: CANNULATED HIP PINNING;  Surgeon: Earnestine Leys, MD;  Location: [facility] ORS;  Service: Orthopedics;  Laterality: Left;  . INSERTION OF DIALYSIS CATHETER  07/2010  . JOINT REPLACEMENT     left knee  . MINOR REMOVAL OF PERITONEAL DIALYSIS CATHETER  04/15/2016   Procedure: MINOR REMOVAL OF PERITONEAL DIALYSIS CATHETER;  Surgeon: Algernon Huxley, MD;  Location: [facility] ORS;  Service: Vascular;;  . PACEMAKER INSERTION  2006  . PERIPHERAL VASCULAR CATHETERIZATION N/A 12/18/2015   Procedure: Dialysis/Perma Catheter Insertion;  Surgeon: Katha Cabal, MD;  Location: [place] [facility];  Service: Cardiovascular;  Laterality: N/A;  . TOTAL KNEE ARTHROPLASTY  2008   LEFT/Dr [place]    Prior to Admission medications   Medication Sig Start Date End Date Taking? Authorizing Provider  acetaminophen (TYLENOL) 325 MG tablet Take 2 tablets (650 mg total) by mouth every 4 (four) hours as needed for mild pain or moderate pain. 05/14/16   Loletha Grayer, MD  aspirin 325 MG tablet Take by mouth.    [provider]  diazepam (VALIUM) 5 MG tablet  11/20/16   [provider]  diphenoxylate-atropine (LOMOTIL) 2.5-0.025 MG tablet TAKE 1 TABLET BY  MOUTH FOUR TIMES DAILY AS NEEDED FOR DIARRHEA OR LOOSE STOOLS Patient taking differently: TAKE 1 TABLET BY MOUTH every 6 hours AS NEEDED FOR DIARRHEA OR LOOSE STOOLS 08/30/15   [name], MD  levothyroxine (SYNTHROID, LEVOTHROID) 25 MCG tablet Take 1 tablet (25 mcg total) by mouth daily. Patient taking differently: Take 25 mcg by mouth daily. (0800) 10/25/15   [name], MD  lidocaine-prilocaine (EMLA) cream Apply 1 application topically every Monday, Wednesday, and Friday with hemodialysis. (1100) apply to right arm dialysis access pre-dialysis wrap with saran wrap after applying.    [provider]  meclizine (ANTIVERT) 25 MG tablet Take by mouth.    [provider]  metoCLOPramide (REGLAN) 5 MG tablet Take 1 tablet (5 mg total) by mouth every 8 (eight) hours as needed (vertigo). 05/30/16   Menshew, Dannielle Karvonen, PA-C  metoprolol succinate (TOPROL XL) 25 MG 24 hr tablet TAKE 1/2 BY MOUTH DAILY 01/30/16   [name], MD  metoprolol tartrate (LOPRESSOR) 25 MG tablet  12/22/16   [provider]  mupirocin ointment (  BACTROBAN) 2 % Apply 1 application topically 2 (two) times daily. 09/17/16   Frederich Cha, MD  potassium chloride (K-DUR,KLOR-CON) 10 MEQ tablet Take by mouth.    [provider]  sertraline (ZOLOFT) 50 MG tablet TAKE 3 TABLETS(150 MG) BY MOUTH DAILY 08/30/15   [name], MD  traMADol (ULTRAM) 50 MG tablet Take 1 tablet (50 mg total) by mouth every 12 (twelve) hours as needed for severe pain. 05/14/16   Loletha Grayer, MD  Vitamin D, Ergocalciferol, (DRISDOL) 50000 units CAPS capsule Take 50,000 Units by mouth every Monday. (0900) 04/17/15   [provider]    Allergies Statins; Codeine sulfate; Codeine; Effexor [venlafaxine]; and Ezetimibe-simvastatin  Family History  Problem Relation Age of Onset  . Cancer Mother        ? ovarian - sarcoma  . Cancer Father        Skin cancer  . Diabetes Sister   . COPD Sister   .  Depression Sister   . Cancer Sister        Lung - 54 yrs old    Social History Social History   Tobacco Use  . Smoking status: Former Smoker    Last attempt to quit: 03/31/1948    Years since quitting: 68.8  . Smokeless tobacco: Never Used  Substance Use Topics  . Alcohol use: No  . Drug use: No    Review of Systems Constitutional: No fever/chills Eyes: No visual changes. ENT: No sore throat. No stiff neck no neck pain Cardiovascular: See HPI Respiratory: Denies shortness of breath.  Does not have cough Gastrointestinal:   no vomiting.  No diarrhea.  No constipation. Genitourinary: Negative for dysuria. Musculoskeletal: Negative lower extremity swelling Skin: Negative for rash. Neurological: Negative for severe headaches, focal weakness or numbness.   ____________________________________________   PHYSICAL EXAM:  VITAL SIGNS: ED Triage Vitals  Enc Vitals Group     BP 01/01/17 1725 (!) 158/63     Pulse Rate 01/01/17 1725 63     Resp 01/01/17 1725 16     Temp 01/01/17 1725 97.8 F (36.6 C)     Temp Source 01/01/17 1725 Oral     SpO2 01/01/17 1720 98 %     Weight --      Height --      Head Circumference --      Peak Flow --      Pain Score --      Pain Loc --      Pain Edu? --      Excl. in [place]? --     Constitutional: Alert and oriented. Well appearing and in no acute distress. Eyes: Conjunctivae are normal Head: Atraumatic HEENT: No congestion/rhinnorhea. Mucous membranes are moist.  Oropharynx non-erythematous Neck:   Nontender with no meningismus, no masses, no stridor Cardiovascular: Normal rate, regular rhythm. Grossly normal heart sounds.  Good peripheral circulation. Respiratory: Normal respiratory effort.  No retractions. Lungs CTAB.  Some mild diminishment in the right. Chest: Tender to palpation a very specific point in the right chest wall when he touches the area patient states "ouch that is the pain right there" and post back.  There is no  crepitus there is no flail chest. Abdominal: Soft and nontender. No distention. No guarding no rebound Back:  There is no focal tenderness or step off.  there is no midline tenderness there are no lesions noted. there is no CVA tenderness Musculoskeletal: No lower extremity tenderness, no upper extremity tenderness. No joint  effusions, no DVT signs strong distal pulses no edema Neurologic:  Normal speech and language. No gross focal neurologic deficits are appreciated.  Skin:  Skin is warm, dry and intact. No rash noted. Psychiatric: Mood and affect are normal. Speech and behavior are normal.  ____________________________________________   LABS (all labs ordered are listed, but only abnormal results are displayed)  Labs Reviewed  CBC WITH DIFFERENTIAL/PLATELET - Abnormal; Notable for the following components:      Result Value   RBC 3.36 (*)    Hemoglobin 10.6 (*)    HCT 31.5 (*)    RDW 16.5 (*)    All other components within normal limits  BASIC METABOLIC PANEL - Abnormal; Notable for the following components:   Potassium 3.4 (*)    Chloride 100 (*)    Glucose, Bld 142 (*)    BUN 21 (*)    Creatinine, Ser 1.47 (*)    Calcium 8.8 (*)    GFR calc non Af Amer 31 (*)    GFR calc Af Amer 36 (*)    All other components within normal limits  TROPONIN I - Abnormal; Notable for the following components:   Troponin I 0.10 (*)    All other components within normal limits  CULTURE, BLOOD (ROUTINE X 2)  CULTURE, BLOOD (ROUTINE X 2)    Pertinent labs  results that were available during my care of the patient were reviewed by me and considered in my medical decision making (see chart for details). ____________________________________________  EKG  I personally interpreted any EKGs ordered by me or triage V paced rhythm rate 61 bpm ____________________________________________  RADIOLOGY  Pertinent labs & imaging results that were available during my care of the patient were reviewed  by me and considered in my medical decision making (see chart for details). If possible, patient and/or family made aware of any abnormal findings.  Dg Chest 2 View  Result Date: 01/01/2017 CLINICAL DATA:  Mid chest pain.  Fall today. EXAM: CHEST  2 VIEW COMPARISON:  One-view chest x-ray 10/11/2016. FINDINGS: The heart is enlarged. A large right pleural effusion and airspace disease is present. Asymmetric edema is present in the right lung. Changes of COPD are noted. The left lung is clear. Pacing wires are in place. Aortic atherosclerosis is present at the aortic arch. The visualized soft tissues and bony thorax are unremarkable. IMPRESSION: 1. Right lower lobe pneumonia and effusion. 2. Asymmetric right-sided edema associated cardiomegaly. 3.  Aortic Atherosclerosis (ICD10-I70.0). Electronically Signed   By: San Morelle M.D.   On: 01/01/2017 18:09   Ct Head Wo Contrast  Result Date: 01/01/2017 CLINICAL DATA:  83 year old female status post fall this morning presents with head trauma and headache. EXAM: CT HEAD WITHOUT CONTRAST TECHNIQUE: Contiguous axial images were obtained from the base of the skull through the vertex without intravenous contrast. COMPARISON:  12/03/2016 FINDINGS: BRAIN: There is chronic mild sulcal and ventricular prominence consistent with superficial and central atrophy. No intraparenchymal hemorrhage, mass effect nor midline shift. Periventricular and subcortical white matter hypodensities consistent with chronic small vessel ischemic disease are identified. No acute large vascular territory infarcts. No abnormal extra-axial fluid collections. Basal cisterns are not effaced and midline. VASCULAR: Moderate calcific atherosclerosis of the carotid siphons, both vertebral arteries and basilar artery. SKULL: No skull fracture. No significant scalp soft tissue swelling. SINUSES/ORBITS: The mastoid air-cells are clear. The included paranasal sinuses are well-aerated.The included  ocular globes and orbital contents are non-suspicious. OTHER: None. IMPRESSION: Atrophy with chronic  small vessel ischemic disease. No acute intracranial abnormality. No skull fracture. Electronically Signed   By: Ashley Royalty M.D.   On: 01/01/2017 18:01   ____________________________________________    PROCEDURES  Procedure(s) performed: None  Procedures  Critical Care performed: None  ____________________________________________   INITIAL IMPRESSION / ASSESSMENT AND PLAN / ED COURSE  Pertinent labs & imaging results that were available during my care of the patient were reviewed by me and considered in my medical decision making (see chart for details).  Patient here with very reproducible chest wall pain however it was brought up on after a fall that seemed to be syncopal in nature patient has no recollection of how she ended up on the floor which is in distinction to most falls that she has.  For this reason I did institute a workup.  Troponin is borderline elevated which is certainly partially attributable to her dialysis however, chest x-ray shows a pneumonia.  Given this I am obtaining a CT scan patient states she has had a slight cough but she initially did not admit this until I specifically asked her but she is a somewhat poor historian, we will evaluate for possible other pulmonary pathology, her sats are good but she did have a syncopal event given troponin, chest x-ray suggestive pneumonia, and elevated troponin we will admit the patient for observation.  We will start her on broad-spectrum CAP consistent pneumonia antibiotics.      ----------------------------------------- 9:31 PM on 01/01/2017 -----------------------------------------  Suspicion about other cause of patient's chest pain and chest x-ray abnormalities is sustained by CT scan, which shows pulmonary emboli, no evidence of cor pulmonale.  We are starting the patient on heparin, per pharmacy protocol.  Patient  does have a chronic pulmonary effusion on the right side I do not think it represents hemothorax there is no evidence of rib fracture and the thread of PE which could have certainly contributed to her syncope I think outweighs the risk that this is actually a bleed. ____________________________________________   FINAL CLINICAL IMPRESSION(S) / ED DIAGNOSES  Final diagnoses:  None      This chart was dictated using voice recognition software.  Despite best efforts to proofread,  errors can occur which can change meaning.      Schuyler Amor, MD 01/01/17 1900    Schuyler Amor, MD 01/01/17 Lurline Hare    Schuyler Amor, MD 01/01/17 2132

## 2017-01-01 NOTE — ED Notes (Signed)
Called floor to give report and was placed on hold.

## 2017-01-01 NOTE — H&P (Signed)
[place] [facility] [place] [name]    MR#:  [id number]  DATE OF BIRTH:  05-08-1926  DATE OF ADMISSION:  01/01/2017  PRIMARY CARE PHYSICIAN: Kirk Ruths, MD   REQUESTING/REFERRING PHYSICIAN: Dr. Burlene Arnt  CHIEF COMPLAINT:   Chief Complaint  Patient presents with  . Fall    HISTORY OF PRESENT ILLNESS:  Sharon [name]  is a 83 y.o. female with a known history of ESRD, left femur fracture, AOCD, HTN presents to the emergency room due to syncope earlier today.  Patient had dialysis after syncope and then sent to the emergency room.  Here initially chest x-ray raise concern for possible right-sided pneumonia.  CTA of the chest showed right lower lobe subsegmental PE.  Patient is being admitted on heparin drip due to having syncope and PE. She complains of pleuritic chest pain and chronic cough and chronic shortness of breath. She has had recent surgeries on left lower activity.  PAST MEDICAL HISTORY:   Past Medical History:  Diagnosis Date  . Arthritis   . CAD (coronary artery disease)   . Cancer ([place])    skin  . Cardiomyopathy ([place])   . Depression   . IBS (irritable bowel syndrome) 2010  . Kidney failure July 2012   Hemodialysis 3xweek  . Kidney failure   . Meniere disease   . Meniere's disease   . Myocardial infarction ([place])   . OSA (obstructive sleep apnea)    CPAP  . Peritoneal dialysis status ([place])   . Presence of permanent cardiac pacemaker   . Renal insufficiency     PAST SURGICAL HISTORY:   Past Surgical History:  Procedure Laterality Date  . [place]  . ANUS SURGERY  2010  . AV FISTULA PLACEMENT Right 04/15/2016   Procedure: ARTERIOVENOUS (AV) FISTULA CREATION ( RADIOCEPHALIC ) STAGE 2;  Surgeon: Algernon Huxley, MD;  Location: [facility];  Service: Vascular;  Laterality: Right;  . CATARACT EXTRACTION  2006, 2011  . CHOLECYSTECTOMY  01/2008  . DIALYSIS/PERMA CATHETER REMOVAL N/A  08/20/2016   Procedure: Dialysis/Perma Catheter Removal;  Surgeon: Algernon Huxley, MD;  Location: [place] [facility];  Service: Cardiovascular;  Laterality: N/A;  . EYE SURGERY     cataracts bilateral  . FEMUR IM NAIL Left 12/15/2015   Procedure: INTRAMEDULLARY (IM) RETROGRADE FEMORAL NAILING;  Surgeon: Oletta Cohn, DO;  Location: [facility];  Service: Orthopedics;  Laterality: Left;  . HIP PINNING,CANNULATED Left 05/10/2016   Procedure: CANNULATED HIP PINNING;  Surgeon: Earnestine Leys, MD;  Location: [facility];  Service: Orthopedics;  Laterality: Left;  . INSERTION OF DIALYSIS CATHETER  07/2010  . JOINT REPLACEMENT     left knee  . MINOR REMOVAL OF PERITONEAL DIALYSIS CATHETER  04/15/2016   Procedure: MINOR REMOVAL OF PERITONEAL DIALYSIS CATHETER;  Surgeon: Algernon Huxley, MD;  Location: [facility];  Service: Vascular;;  . PACEMAKER INSERTION  2006  . PERIPHERAL VASCULAR CATHETERIZATION N/A 12/18/2015   Procedure: Dialysis/Perma Catheter Insertion;  Surgeon: Katha Cabal, MD;  Location: [place] [facility];  Service: Cardiovascular;  Laterality: N/A;  . TOTAL KNEE ARTHROPLASTY  2008   LEFT/Dr [place]    SOCIAL HISTORY:   Social History   Tobacco Use  . Smoking status: Former Smoker    Last attempt to quit: 03/31/1948    Years since quitting: 68.8  . Smokeless tobacco: Never Used  Substance Use Topics  . Alcohol use: No  FAMILY HISTORY:   Family History  Problem Relation Age of Onset  . Cancer Mother        ? ovarian - sarcoma  . Cancer Father        Skin cancer  . Diabetes Sister   . COPD Sister   . Depression Sister   . Cancer Sister        Lung - 74 yrs old    DRUG ALLERGIES:   Allergies  Allergen Reactions  . Statins Other (See Comments)    Muscle weakness Muscle weakness severe  . Codeine Sulfate Nausea And Vomiting  . Codeine Other (See Comments)    GI UPSET  . Effexor [Venlafaxine] Nausea Only  . Ezetimibe-Simvastatin Other (See Comments)    Muscle weakness     REVIEW OF SYSTEMS:   Review of Systems  Constitutional: Positive for malaise/fatigue. Negative for chills and fever.  HENT: Negative for sore throat.   Eyes: Negative for blurred vision, double vision and pain.  Respiratory: Positive for cough, sputum production and shortness of breath. Negative for hemoptysis and wheezing.   Cardiovascular: Positive for chest pain. Negative for palpitations, orthopnea and leg swelling.  Gastrointestinal: Negative for abdominal pain, constipation, diarrhea, heartburn, nausea and vomiting.  Genitourinary: Negative for dysuria and hematuria.  Musculoskeletal: Negative for back pain and joint pain.  Skin: Negative for rash.  Neurological: Positive for weakness. Negative for sensory change, speech change, focal weakness and headaches.  Endo/Heme/Allergies: Does not bruise/bleed easily.  Psychiatric/Behavioral: Negative for depression. The patient is not nervous/anxious.     MEDICATIONS AT HOME:   Prior to Admission medications   Medication Sig Start Date End Date Taking? Authorizing Provider  levothyroxine (SYNTHROID, LEVOTHROID) 25 MCG tablet Take 1 tablet (25 mcg total) by mouth daily. Patient taking differently: Take 25 mcg by mouth daily. (0800) 10/25/15  Yes [name], MD  meloxicam (MOBIC) 7.5 MG tablet Take 7.5 mg by mouth daily.   Yes [provider]  metoprolol succinate (TOPROL XL) 25 MG 24 hr tablet TAKE 1/2 BY MOUTH DAILY Patient taking differently: Take 25 mg by mouth daily.  01/30/16  Yes [name], MD  sertraline (ZOLOFT) 50 MG tablet TAKE 3 TABLETS(150 MG) BY MOUTH DAILY 08/30/15  Yes [name], MD  Vitamin D, Ergocalciferol, (DRISDOL) 50000 units CAPS capsule Take 50,000 Units by mouth every Monday. (0900) 04/17/15  Yes [provider]  acetaminophen (TYLENOL) 325 MG tablet Take 2 tablets (650 mg total) by mouth every 4 (four) hours as needed for mild pain or moderate pain. 05/14/16   Loletha Grayer, MD  aspirin 325 MG tablet Take by mouth.    [provider]  diazepam (VALIUM) 5 MG tablet  11/20/16   [provider]  diphenoxylate-atropine (LOMOTIL) 2.5-0.025 MG tablet TAKE 1 TABLET BY MOUTH FOUR TIMES DAILY AS NEEDED FOR DIARRHEA OR LOOSE STOOLS Patient taking differently: TAKE 1 TABLET BY MOUTH every 6 hours AS NEEDED FOR DIARRHEA OR LOOSE STOOLS 08/30/15   [name], MD  lidocaine-prilocaine (EMLA) cream Apply 1 application topically every Monday, Wednesday, and Friday with hemodialysis. (1100) apply to right arm dialysis access pre-dialysis wrap with saran wrap after applying.    [provider]  meclizine (ANTIVERT) 25 MG tablet Take by mouth.    [provider]  metoCLOPramide (REGLAN) 5 MG tablet Take 1 tablet (5 mg total) by mouth every 8 (eight) hours as needed (vertigo). 05/30/16   Menshew, Dannielle Karvonen, PA-C  metoprolol  tartrate (LOPRESSOR) 25 MG tablet  12/22/16   [provider]  mupirocin ointment (BACTROBAN) 2 % Apply 1 application topically 2 (two) times daily. 09/17/16   Frederich Cha, MD  potassium chloride (K-DUR,KLOR-CON) 10 MEQ tablet Take by mouth.    [provider]  traMADol (ULTRAM) 50 MG tablet Take 1 tablet (50 mg total) by mouth every 12 (twelve) hours as needed for severe pain. 05/14/16   Loletha Grayer, MD     VITAL SIGNS:  Blood pressure (!) 147/86, pulse 68, temperature 97.8 F (36.6 C), temperature source Oral, resp. rate 19, SpO2 100 %.  PHYSICAL EXAMINATION:  Physical Exam  GENERAL:  83 y.o.-year-old patient lying in the bed with no acute distress.  EYES: Pupils equal, round, reactive to light and accommodation. No scleral icterus. Extraocular muscles intact.  HEENT: Head atraumatic, normocephalic. Oropharynx and nasopharynx clear. No oropharyngeal erythema, moist oral mucosa  NECK:  Supple, no jugular venous distention. No thyroid enlargement, no tenderness.  LUNGS: Coarse breath sounds  left and decreased basal on right CARDIOVASCULAR: S1, S2 normal. No murmurs, rubs, or gallops.  ABDOMEN: Soft, nontender, nondistended. Bowel sounds present. No organomegaly or mass.  EXTREMITIES: No pedal edema, cyanosis, or clubbing. + 2 pedal & radial pulses b/l.   NEUROLOGIC: Cranial nerves II through XII are intact. No focal Motor or sensory deficits appreciated b/l PSYCHIATRIC: The patient is alert and oriented x 3. Good affect.  SKIN: No obvious rash, lesion, or ulcer.   LABORATORY PANEL:   CBC Recent Labs  Lab 01/01/17 1725  WBC 5.0  HGB 10.6*  HCT 31.5*  PLT 214   ------------------------------------------------------------------------------------------------------------------  Chemistries  Recent Labs  Lab 01/01/17 1725  NA 139  K 3.4*  CL 100*  CO2 28  GLUCOSE 142*  BUN 21*  CREATININE 1.47*  CALCIUM 8.8*   ------------------------------------------------------------------------------------------------------------------  Cardiac Enzymes Recent Labs  Lab 01/01/17 1725  TROPONINI 0.10*   ------------------------------------------------------------------------------------------------------------------  RADIOLOGY:  Dg Chest 2 View  Result Date: 01/01/2017 CLINICAL DATA:  Mid chest pain.  Fall today. EXAM: CHEST  2 VIEW COMPARISON:  One-view chest x-ray 10/11/2016. FINDINGS: The heart is enlarged. A large right pleural effusion and airspace disease is present. Asymmetric edema is present in the right lung. Changes of COPD are noted. The left lung is clear. Pacing wires are in place. Aortic atherosclerosis is present at the aortic arch. The visualized soft tissues and bony thorax are unremarkable. IMPRESSION: 1. Right lower lobe pneumonia and effusion. 2. Asymmetric right-sided edema associated cardiomegaly. 3.  Aortic Atherosclerosis (ICD10-I70.0). Electronically Signed   By: San Morelle M.D.   On: 01/01/2017 18:09   Ct Head Wo Contrast  Result Date:  01/01/2017 CLINICAL DATA:  83 year old female status post fall this morning presents with head trauma and headache. EXAM: CT HEAD WITHOUT CONTRAST TECHNIQUE: Contiguous axial images were obtained from the base of the skull through the vertex without intravenous contrast. COMPARISON:  12/03/2016 FINDINGS: BRAIN: There is chronic mild sulcal and ventricular prominence consistent with superficial and central atrophy. No intraparenchymal hemorrhage, mass effect nor midline shift. Periventricular and subcortical white matter hypodensities consistent with chronic small vessel ischemic disease are identified. No acute large vascular territory infarcts. No abnormal extra-axial fluid collections. Basal cisterns are not effaced and midline. VASCULAR: Moderate calcific atherosclerosis of the carotid siphons, both vertebral arteries and basilar artery. SKULL: No skull fracture. No significant scalp soft tissue swelling. SINUSES/ORBITS: The mastoid air-cells are clear. The included paranasal sinuses are well-aerated.The included ocular globes  and orbital contents are non-suspicious. OTHER: None. IMPRESSION: Atrophy with chronic small vessel ischemic disease. No acute intracranial abnormality. No skull fracture. Electronically Signed   By: Ashley Royalty M.D.   On: 01/01/2017 18:01   Ct Angio Chest Pe W And/or Wo Contrast  Result Date: 01/01/2017 CLINICAL DATA:  PE suspected, low pretest probability. Mid chest pain with deep inspiration. EXAM: CT ANGIOGRAPHY CHEST WITH CONTRAST TECHNIQUE: Multidetector CT imaging of the chest was performed using the standard protocol during bolus administration of intravenous contrast. Multiplanar CT image reconstructions and MIPs were obtained to evaluate the vascular anatomy. CONTRAST:  71mL ISOVUE-370 IOPAMIDOL (ISOVUE-370) INJECTION 76% COMPARISON:  Two-view chest x-ray from the same day. FINDINGS: Cardiovascular: The heart is enlarged. The right ventricular ratio is less than 0.9. Coronary  artery calcifications are present. Dense calcifications are present at the aortic arch without aneurysm or definite stenosis of the great vessel origins. Pulmonary arterial opacification is excellent. There are filling defects in the lateral posterior basilar segmental artery is of the right lower lobe. Right middle lobe and right upper lobe artery is opacified normally. There significant emboli are present on the left. Mediastinum/Nodes: No significant mediastinal or axillary adenopathy is present. Lungs/Pleura: A large right pleural effusion is present. There is partial collapse of the right lower lobe. Ground-glass attenuation in the right upper and middle lobe likely reflects atelectasis. Mild left basilar atelectasis is present. Upper Abdomen: Dense vascular calcifications are present. The kidneys are atrophic. Musculoskeletal: Vertebral body heights alignment are normal. No focal lytic or blastic lesions are present. Review of the MIP images confirms the above findings. IMPRESSION: 1. Right lower lobe segmental pulmonary emboli. 2. Large right pleural effusion and partial collapse of the right lower lobe. 3. Cardiomegaly with ground-glass attenuation reflecting atelectasis or edema. 4.  Aortic Atherosclerosis (ICD10-I70.0). 5. Coronary artery disease. Critical Value/emergent results were called by telephone at the time of interpretation on 01/01/2017 at 7:33 pm to Dr. Charlotte Crumb , who verbally acknowledged these results. Electronically Signed   By: San Morelle M.D.   On: 01/01/2017 19:34     IMPRESSION AND PLAN:   *Right lower lobe subsegmental PE with syncope.  Patient will be admitted to medical floor with telemetry monitoring.  Start heparin drip.  Check echocardiogram. Small PE is unlikely to be contributing to her syncope.  Will wait for echocardiogram.  Consult cardiology. Has mild elevation in troponin of 0.10.  Will repeat troponin. Consult oncology  *Right large pleural effusion.   Compared to her prior CT scan this is worsened.  Will get thoracentesis for diagnostic and therapeutic reasons. Heparin drip can be stopped prior to thoracentesis and restarted.  *End stage renal disease on hemodialysis.  Consult nephrology for dialysis needs.   All the records are reviewed and case discussed with ED provider. Management plans discussed with the patient, family and they are in agreement.  CODE STATUS: FULL CODE  TOTAL TIME TAKING CARE OF THIS PATIENT: 40 minutes.   Leia Alf Jeimy Bickert M.D on 01/01/2017 at 8:30 PM  Between 7am to 6pm - Pager - [phone]  After 6pm go to [email or web address] - password [email or web address] [place]  Office  [phone]  CC: Primary care physician; Kirk Ruths, MD  Note: This dictation was prepared with Dragon dictation along with smaller phrase technology. Any transcriptional errors that result from this process are unintentional.

## 2017-01-01 NOTE — ED Notes (Signed)
Levada Dy RN reported that she already called [facility] to let them know that she will be admitted to hospital.

## 2017-01-01 NOTE — ED Triage Notes (Signed)
Pt brought in by [facility] from Dialysis. Per EMS dialysis staff informed them that patient fell this morning at [facility], Pt states that she does not remember falling, pt states that she remembers being in her bedroom and then waking up in the bathroom floor. Pt denies head or neck pain but is having mid chest pain when she takes a deep breath. Pt is in NAD at this time.

## 2017-01-01 NOTE — Progress Notes (Signed)
ANTICOAGULATION CONSULT NOTE - Initial Consult  Pharmacy Consult for heparin gtt Indication: pulmonary embolus  Allergies  Allergen Reactions  . Statins Other (See Comments)    Muscle weakness Muscle weakness severe  . Codeine Sulfate Nausea And Vomiting  . Codeine Other (See Comments)    GI UPSET  . Effexor [Venlafaxine] Nausea Only  . Ezetimibe-Simvastatin Other (See Comments)    Muscle weakness    Patient Measurements:   Heparin Dosing Weight: 44.5kg  Vital Signs: Temp: 97.8 F (36.6 C) (12/07 1725) Temp Source: Oral (12/07 1725) BP: 158/63 (12/07 1725) Pulse Rate: 63 (12/07 1725)  Labs: Recent Labs    01/01/17 1725  HGB 10.6*  HCT 31.5*  PLT 214  CREATININE 1.47*  TROPONINI 0.10*    Estimated Creatinine Clearance: 18.6 mL/min (A) (by C-G formula based on SCr of 1.47 mg/dL (H)).   Medical History: Past Medical History:  Diagnosis Date  . Arthritis   . CAD (coronary artery disease)   . Cancer ([place])    skin  . Cardiomyopathy ([place])   . Depression   . IBS (irritable bowel syndrome) 2010  . Kidney failure July 2012   Hemodialysis 3xweek  . Kidney failure   . Meniere disease   . Meniere's disease   . Myocardial infarction ([place])   . OSA (obstructive sleep apnea)    CPAP  . Peritoneal dialysis status ([place])   . Presence of permanent cardiac pacemaker   . Renal insufficiency     Medications:   (Not in a hospital admission) Scheduled:  . cefTRIAXone (ROCEPHIN) IM  1 g Intramuscular Once  . heparin  2,700 Units Intravenous Once   Infusions:  . azithromycin (ZITHROMAX) 500 MG IVPB    . heparin     PRN:  Anti-infectives (From admission, onward)   Start     Dose/Rate Route Frequency Ordered Stop   01/01/17 1830  cefTRIAXone (ROCEPHIN) injection 1 g     1 g Intramuscular  Once 01/01/17 1819     01/01/17 1830  azithromycin (ZITHROMAX) 500 mg in dextrose 5 % 250 mL IVPB     500 mg 250 mL/hr over 60 Minutes Intravenous  Once 01/01/17 1819         Assessment: 83 year old female with PE, requiring heparin gtt protocol by pharmacy    Goal of Therapy:  Heparin level 0.3-0.7 units/ml Monitor platelets by anticoagulation protocol: Yes   Plan:  Give 2700 units bolus x 1 Start heparin infusion at 800 units/hr Check anti-Xa level in 8 hours and daily while on heparin Continue to monitor H&H and platelets  Sharon [name] Sharon [name] 01/01/2017,7:42 PM

## 2017-01-02 ENCOUNTER — Observation Stay: Payer: Medicare Other

## 2017-01-02 ENCOUNTER — Observation Stay (HOSPITAL_COMMUNITY)
Admit: 2017-01-02 | Discharge: 2017-01-02 | Disposition: A | Discharge status: Home / Self Care | Payer: Medicare Other | Attending: Internal Medicine | Admitting: Internal Medicine

## 2017-01-02 DIAGNOSIS — Z7901 Long term (current) use of anticoagulants: Secondary | ICD-10-CM

## 2017-01-02 DIAGNOSIS — Z888 Allergy status to other drugs, medicaments and biological substances status: Secondary | ICD-10-CM | POA: Diagnosis not present

## 2017-01-02 DIAGNOSIS — I5022 Chronic systolic (congestive) heart failure: Secondary | ICD-10-CM | POA: Diagnosis present

## 2017-01-02 DIAGNOSIS — I132 Hypertensive heart and chronic kidney disease with heart failure and with stage 5 chronic kidney disease, or end stage renal disease: Secondary | ICD-10-CM | POA: Diagnosis present

## 2017-01-02 DIAGNOSIS — I248 Other forms of acute ischemic heart disease: Secondary | ICD-10-CM | POA: Diagnosis present

## 2017-01-02 DIAGNOSIS — G4730 Sleep apnea, unspecified: Secondary | ICD-10-CM

## 2017-01-02 DIAGNOSIS — Z9181 History of falling: Secondary | ICD-10-CM | POA: Diagnosis not present

## 2017-01-02 DIAGNOSIS — H8109 Meniere's disease, unspecified ear: Secondary | ICD-10-CM | POA: Diagnosis not present

## 2017-01-02 DIAGNOSIS — Z8701 Personal history of pneumonia (recurrent): Secondary | ICD-10-CM | POA: Diagnosis not present

## 2017-01-02 DIAGNOSIS — R0789 Other chest pain: Secondary | ICD-10-CM | POA: Diagnosis not present

## 2017-01-02 DIAGNOSIS — S41112A Laceration without foreign body of left upper arm, initial encounter: Secondary | ICD-10-CM | POA: Diagnosis present

## 2017-01-02 DIAGNOSIS — R410 Disorientation, unspecified: Secondary | ICD-10-CM | POA: Diagnosis not present

## 2017-01-02 DIAGNOSIS — Z85828 Personal history of other malignant neoplasm of skin: Secondary | ICD-10-CM

## 2017-01-02 DIAGNOSIS — G4733 Obstructive sleep apnea (adult) (pediatric): Secondary | ICD-10-CM | POA: Diagnosis present

## 2017-01-02 DIAGNOSIS — I2510 Atherosclerotic heart disease of native coronary artery without angina pectoris: Secondary | ICD-10-CM

## 2017-01-02 DIAGNOSIS — E876 Hypokalemia: Secondary | ICD-10-CM | POA: Diagnosis not present

## 2017-01-02 DIAGNOSIS — Z809 Family history of malignant neoplasm, unspecified: Secondary | ICD-10-CM

## 2017-01-02 DIAGNOSIS — I340 Nonrheumatic mitral (valve) insufficiency: Secondary | ICD-10-CM | POA: Diagnosis not present

## 2017-01-02 DIAGNOSIS — Z9842 Cataract extraction status, left eye: Secondary | ICD-10-CM | POA: Diagnosis not present

## 2017-01-02 DIAGNOSIS — M129 Arthropathy, unspecified: Secondary | ICD-10-CM | POA: Diagnosis not present

## 2017-01-02 DIAGNOSIS — J90 Pleural effusion, not elsewhere classified: Secondary | ICD-10-CM | POA: Diagnosis not present

## 2017-01-02 DIAGNOSIS — Z992 Dependence on renal dialysis: Secondary | ICD-10-CM | POA: Diagnosis not present

## 2017-01-02 DIAGNOSIS — K589 Irritable bowel syndrome without diarrhea: Secondary | ICD-10-CM | POA: Diagnosis present

## 2017-01-02 DIAGNOSIS — Y92129 Unspecified place in nursing home as the place of occurrence of the external cause: Secondary | ICD-10-CM | POA: Diagnosis not present

## 2017-01-02 DIAGNOSIS — Z87891 Personal history of nicotine dependence: Secondary | ICD-10-CM

## 2017-01-02 DIAGNOSIS — Z9989 Dependence on other enabling machines and devices: Secondary | ICD-10-CM | POA: Diagnosis not present

## 2017-01-02 DIAGNOSIS — I871 Compression of vein: Secondary | ICD-10-CM | POA: Diagnosis not present

## 2017-01-02 DIAGNOSIS — M1990 Unspecified osteoarthritis, unspecified site: Secondary | ICD-10-CM | POA: Diagnosis present

## 2017-01-02 DIAGNOSIS — N186 End stage renal disease: Secondary | ICD-10-CM

## 2017-01-02 DIAGNOSIS — N2581 Secondary hyperparathyroidism of renal origin: Secondary | ICD-10-CM | POA: Diagnosis present

## 2017-01-02 DIAGNOSIS — I252 Old myocardial infarction: Secondary | ICD-10-CM

## 2017-01-02 DIAGNOSIS — R072 Precordial pain: Secondary | ICD-10-CM | POA: Diagnosis not present

## 2017-01-02 DIAGNOSIS — Z885 Allergy status to narcotic agent status: Secondary | ICD-10-CM | POA: Diagnosis not present

## 2017-01-02 DIAGNOSIS — W19XXXA Unspecified fall, initial encounter: Secondary | ICD-10-CM | POA: Diagnosis present

## 2017-01-02 DIAGNOSIS — F329 Major depressive disorder, single episode, unspecified: Secondary | ICD-10-CM | POA: Diagnosis not present

## 2017-01-02 DIAGNOSIS — I429 Cardiomyopathy, unspecified: Secondary | ICD-10-CM

## 2017-01-02 DIAGNOSIS — E039 Hypothyroidism, unspecified: Secondary | ICD-10-CM | POA: Diagnosis present

## 2017-01-02 DIAGNOSIS — Z9049 Acquired absence of other specified parts of digestive tract: Secondary | ICD-10-CM | POA: Diagnosis not present

## 2017-01-02 DIAGNOSIS — Z950 Presence of cardiac pacemaker: Secondary | ICD-10-CM | POA: Diagnosis not present

## 2017-01-02 DIAGNOSIS — R0602 Shortness of breath: Secondary | ICD-10-CM | POA: Diagnosis not present

## 2017-01-02 DIAGNOSIS — E559 Vitamin D deficiency, unspecified: Secondary | ICD-10-CM | POA: Diagnosis not present

## 2017-01-02 DIAGNOSIS — Z7189 Other specified counseling: Secondary | ICD-10-CM | POA: Diagnosis not present

## 2017-01-02 DIAGNOSIS — Z801 Family history of malignant neoplasm of trachea, bronchus and lung: Secondary | ICD-10-CM

## 2017-01-02 DIAGNOSIS — I2699 Other pulmonary embolism without acute cor pulmonale: Secondary | ICD-10-CM | POA: Diagnosis not present

## 2017-01-02 DIAGNOSIS — R079 Chest pain, unspecified: Secondary | ICD-10-CM | POA: Diagnosis not present

## 2017-01-02 DIAGNOSIS — R42 Dizziness and giddiness: Secondary | ICD-10-CM | POA: Diagnosis not present

## 2017-01-02 DIAGNOSIS — R55 Syncope and collapse: Secondary | ICD-10-CM | POA: Diagnosis not present

## 2017-01-02 DIAGNOSIS — R531 Weakness: Secondary | ICD-10-CM | POA: Diagnosis present

## 2017-01-02 DIAGNOSIS — J189 Pneumonia, unspecified organism: Secondary | ICD-10-CM | POA: Diagnosis not present

## 2017-01-02 DIAGNOSIS — D631 Anemia in chronic kidney disease: Secondary | ICD-10-CM | POA: Diagnosis not present

## 2017-01-02 DIAGNOSIS — Z515 Encounter for palliative care: Secondary | ICD-10-CM | POA: Diagnosis not present

## 2017-01-02 LAB — ECHOCARDIOGRAM COMPLETE
AVPHT: 599 ms
Area-P 1/2: 3.93 cm2
E decel time: 190 msec
EERAT: 52.42
FS: 22 % — AB (ref 28–44)
HEIGHTINCHES: 64 in
IV/PV OW: 1.2
LA diam end sys: 43 mm
LA diam index: 3.09 cm/m2
LA vol A4C: 91.2 ml
LASIZE: 43 mm
LVEEAVG: 52.42
LVEEMED: 52.42
MV Dec: 190
MV Peak grad: 7 mmHg
MVPKAVEL: 125 m/s
MVPKEVEL: 130 m/s
MVSPHT: 56 ms
PW: 13.2 mm — AB (ref 0.6–1.1)
TAPSE: 17.1 mm
TDI e' medial: 2.48
WEIGHTICAEL: 1540 [oz_av]

## 2017-01-02 LAB — BASIC METABOLIC PANEL
Anion gap: 11 (ref 5–15)
BUN: 31 mg/dL — AB (ref 6–20)
CALCIUM: 8.7 mg/dL — AB (ref 8.9–10.3)
CO2: 28 mmol/L (ref 22–32)
CREATININE: 1.96 mg/dL — AB (ref 0.44–1.00)
Chloride: 100 mmol/L — ABNORMAL LOW (ref 101–111)
GFR calc non Af Amer: 22 mL/min — ABNORMAL LOW (ref >60)
GFR, EST AFRICAN AMERICAN: 25 mL/min — AB (ref >60)
Glucose, Bld: 117 mg/dL — ABNORMAL HIGH (ref 65–99)
Potassium: 3.1 mmol/L — ABNORMAL LOW (ref 3.5–5.1)
SODIUM: 139 mmol/L (ref 135–145)

## 2017-01-02 LAB — CBC
HCT: 28.6 % — ABNORMAL LOW (ref 35.0–47.0)
Hemoglobin: 9.7 g/dL — ABNORMAL LOW (ref 12.0–16.0)
MCH: 31.7 pg (ref 26.0–34.0)
MCHC: 34.1 g/dL (ref 32.0–36.0)
MCV: 93 fL (ref 80.0–100.0)
PLATELETS: 197 10*3/uL (ref 150–440)
RBC: 3.07 MIL/uL — AB (ref 3.80–5.20)
RDW: 16.6 % — AB (ref 11.5–14.5)
WBC: 5.3 10*3/uL (ref 3.6–11.0)

## 2017-01-02 LAB — TROPONIN I: Troponin I: 0.12 ng/mL (ref <0.03)

## 2017-01-02 LAB — HEPARIN LEVEL (UNFRACTIONATED)
HEPARIN UNFRACTIONATED: 2.58 [IU]/mL — AB (ref 0.30–0.70)
Heparin Unfractionated: 2.74 IU/mL — ABNORMAL HIGH (ref 0.30–0.70)
Heparin Unfractionated: 0.63 IU/mL (ref 0.30–0.70)

## 2017-01-02 LAB — HEMOGLOBIN: Hemoglobin: 9.9 g/dL — ABNORMAL LOW (ref 12.0–16.0)

## 2017-01-02 LAB — MRSA PCR SCREENING: MRSA by PCR: NEGATIVE

## 2017-01-02 MED ORDER — AZITHROMYCIN 250 MG PO TABS
250.0000 mg | ORAL_TABLET | Freq: Every day | ORAL | Status: DC
  Administered 2017-01-02 – 2017-01-05 (×4): 250 mg via ORAL
  Filled 2017-01-02 (×4): qty 1

## 2017-01-02 MED ORDER — HEPARIN (PORCINE) IN NACL 100-0.45 UNIT/ML-% IJ SOLN
500.0000 [IU]/h | INTRAMUSCULAR | Status: DC
  Administered 2017-01-02: 500 [IU]/h via INTRAVENOUS

## 2017-01-02 MED ORDER — DEXTROSE 5 % IV SOLN
1.0000 g | INTRAVENOUS | Status: AC
  Administered 2017-01-02 – 2017-01-07 (×5): 1 g via INTRAVENOUS
  Filled 2017-01-02 (×7): qty 10

## 2017-01-02 NOTE — Progress Notes (Signed)
ANTICOAGULATION CONSULT NOTE - Initial Consult  Pharmacy Consult for heparin gtt Indication: pulmonary embolus  Allergies  Allergen Reactions  . Statins Other (See Comments)    Muscle weakness Muscle weakness severe  . Codeine Sulfate Nausea And Vomiting  . Codeine Other (See Comments)    GI UPSET  . Effexor [Venlafaxine] Nausea Only  . Ezetimibe-Simvastatin Other (See Comments)    Muscle weakness    Patient Measurements: Height: 5\' 4"  (162.6 cm) Weight: 96 lb 4 oz (43.7 kg) IBW/kg (Calculated) : 54.7 Heparin Dosing Weight: 44.5kg  Vital Signs: Temp: 97.5 F (36.4 C) (12/08 0604) Temp Source: Oral (12/08 0604) BP: 140/58 (12/08 0604) Pulse Rate: 66 (12/08 0604)  Labs: Recent Labs    01/01/17 1725 01/01/17 2132 01/01/17 2134 01/02/17 0435 01/02/17 0604  HGB 10.6*  --   --  9.7*  --   HCT 31.5*  --   --  28.6*  --   PLT 214  --   --  197  --   APTT  --  >160*  --   --   --   LABPROT  --  16.9*  --   --   --   INR  --  1.39  --   --   --   HEPARINUNFRC  --   --   --  2.58* 2.74*  CREATININE 1.47*  --   --  1.96*  --   TROPONINI 0.10*  --  0.10* 0.12*  --     Estimated Creatinine Clearance: 13.7 mL/min (A) (by C-G formula based on SCr of 1.96 mg/dL (H)).   Medical History: Past Medical History:  Diagnosis Date  . Arthritis   . CAD (coronary artery disease)   . Cancer ([place])    skin  . Cardiomyopathy ([place])   . Depression   . IBS (irritable bowel syndrome) 2010  . Kidney failure July 2012   Hemodialysis 3xweek  . Kidney failure   . Meniere disease   . Meniere's disease   . Myocardial infarction ([place])   . OSA (obstructive sleep apnea)    CPAP  . Peritoneal dialysis status ([place])   . Presence of permanent cardiac pacemaker   . Renal insufficiency     Medications:  Medications Prior to Admission  Medication Sig Dispense Refill Last Dose  . levothyroxine (SYNTHROID, LEVOTHROID) 25 MCG tablet Take 1 tablet (25 mcg total) by mouth daily. (Patient  taking differently: Take 25 mcg by mouth daily. (0800)) 90 tablet 1 01/01/2017  . meloxicam (MOBIC) 7.5 MG tablet Take 7.5 mg by mouth daily.   01/01/2017 at Unknown time  . metoprolol succinate (TOPROL XL) 25 MG 24 hr tablet TAKE 1/2 BY MOUTH DAILY (Patient taking differently: Take 25 mg by mouth daily. ) 30 tablet 11 01/01/2017 at Unknown time  . sertraline (ZOLOFT) 50 MG tablet TAKE 3 TABLETS(150 MG) BY MOUTH DAILY 90 tablet 2 01/01/2017 at Unknown time  . Vitamin D, Ergocalciferol, (DRISDOL) 50000 units CAPS capsule Take 50,000 Units by mouth every Monday. (0900)   Past Week at Unknown time  . acetaminophen (TYLENOL) 325 MG tablet Take 2 tablets (650 mg total) by mouth every 4 (four) hours as needed for mild pain or moderate pain.   Taking  . aspirin 325 MG tablet Take by mouth.   Taking  . diazepam (VALIUM) 5 MG tablet    Taking  . diphenoxylate-atropine (LOMOTIL) 2.5-0.025 MG tablet TAKE 1 TABLET BY MOUTH FOUR TIMES DAILY AS NEEDED FOR DIARRHEA OR  LOOSE STOOLS (Patient taking differently: TAKE 1 TABLET BY MOUTH every 6 hours AS NEEDED FOR DIARRHEA OR LOOSE STOOLS) 30 tablet 5 Taking  . lidocaine-prilocaine (EMLA) cream Apply 1 application topically every Monday, Wednesday, and Friday with hemodialysis. (1100) apply to right arm dialysis access pre-dialysis wrap with saran wrap after applying.   Taking  . meclizine (ANTIVERT) 25 MG tablet Take by mouth.   Taking  . metoCLOPramide (REGLAN) 5 MG tablet Take 1 tablet (5 mg total) by mouth every 8 (eight) hours as needed (vertigo). 15 tablet 0 Taking  . metoprolol tartrate (LOPRESSOR) 25 MG tablet    Taking  . mupirocin ointment (BACTROBAN) 2 % Apply 1 application topically 2 (two) times daily. 22 g 0 Taking  . potassium chloride (K-DUR,KLOR-CON) 10 MEQ tablet Take by mouth.   Taking  . traMADol (ULTRAM) 50 MG tablet Take 1 tablet (50 mg total) by mouth every 12 (twelve) hours as needed for severe pain. 10 tablet 0 Taking   Scheduled:  . aspirin EC   325 mg Oral Daily  . levothyroxine  25 mcg Oral QAC breakfast  . metoprolol succinate  25 mg Oral Daily  . sertraline  100 mg Oral QHS   Infusions:  . heparin     PRN:  Anti-infectives (From admission, onward)   Start     Dose/Rate Route Frequency Ordered Stop   01/01/17 1830  cefTRIAXone (ROCEPHIN) injection 1 g     1 g Intramuscular  Once 01/01/17 1819 01/01/17 1953   01/01/17 1830  azithromycin (ZITHROMAX) 500 mg in dextrose 5 % 250 mL IVPB     500 mg 250 mL/hr over 60 Minutes Intravenous  Once 01/01/17 1819 01/01/17 2107      Assessment: 83 year old female with PE, requiring heparin gtt protocol by pharmacy    Goal of Therapy:  Heparin level 0.3-0.7 units/ml Monitor platelets by anticoagulation protocol: Yes   Plan:  Give 2700 units bolus x 1 Start heparin infusion at 800 units/hr Check anti-Xa level in 8 hours and daily while on heparin Continue to monitor H&H and platelets   12/8 @ 0400 HL unreadable (too high per lab needed to be diluted) Per RN says patient was bleeding out of the right [facility], told RN to hold drip for now and checking stat HL. Hgb also down by 1 unit  12/8 @ 0430 HL 2.58, confirmatory stat level @ 0600 2.74. Holding drip for at least 2 hours because patient is bleeding out of the [facility]. Will restart @ 0800 @ 500 units/hr and will recheck HL @ 1600.  Tobie Lords, PharmD, BCPS Clinical Pharmacist 01/02/2017

## 2017-01-02 NOTE — Plan of Care (Signed)
Monitor for signs of bleeding

## 2017-01-02 NOTE — Progress Notes (Signed)
Pt's sacrum noted to be reddened; stage 1.  Sacral foam and barrier cream placed.  Pt's pull-up removed. Pt concerned that she leaks; explained to patient skin care policy.  Pt agreeable to an external catheter in place of a pull-up.

## 2017-01-02 NOTE — Progress Notes (Signed)
Pt. continues on heparin gtt at 8.IV site to left A/C continues to weep as well as skin tear to left elbow. Both sites were cleaned and redressed, bleeding has seeped through dressing again and I re-enforced the dressing. Awaiting lab results to check PT/INR, PT was increased at 2132 as APTT. I called pharmacy to see if heparin could be put on hold and per pharmacist Shanon Brow we will wait until A.M. Labs to make a decision. Will continue to monitor pt.

## 2017-01-02 NOTE — Plan of Care (Signed)
Pt is A&Ox4. VSS. RA . OOB with assist. Heparin gtt infusing per order. L arm skin tear dressing remains CDI. Pt went for u/s of BLE this shift.  Foam dressing remains CDI on sacrum. Family at bedside earlier in shift. Pt v paced on monitor. Will continue to monitor and report to oncoming RN .

## 2017-01-02 NOTE — Progress Notes (Signed)
ANTICOAGULATION CONSULT NOTE - Initial Consult  Pharmacy Consult for heparin gtt Indication: pulmonary embolus  Allergies  Allergen Reactions  . Statins Other (See Comments)    Muscle weakness Muscle weakness severe  . Codeine Sulfate Nausea And Vomiting  . Codeine Other (See Comments)    GI UPSET  . Effexor [Venlafaxine] Nausea Only  . Ezetimibe-Simvastatin Other (See Comments)    Muscle weakness    Patient Measurements: Height: 5\' 4"  (162.6 cm) Weight: 95 lb 12.8 oz (43.5 kg) IBW/kg (Calculated) : 54.7 Heparin Dosing Weight: 44.5kg  Vital Signs: Temp: 97.6 F (36.4 C) (12/07 2216) Temp Source: Oral (12/07 2216) BP: 138/57 (12/07 2216) Pulse Rate: 65 (12/07 2216)  Labs: Recent Labs    01/01/17 1725 01/01/17 2132 01/01/17 2134 01/02/17 0435  HGB 10.6*  --   --  9.7*  HCT 31.5*  --   --  28.6*  PLT 214  --   --  197  APTT  --  >160*  --   --   LABPROT  --  16.9*  --   --   INR  --  1.39  --   --   CREATININE 1.47*  --   --   --   TROPONINI 0.10*  --  0.10*  --     Estimated Creatinine Clearance: 18.2 mL/min (A) (by C-G formula based on SCr of 1.47 mg/dL (H)).   Medical History: Past Medical History:  Diagnosis Date  . Arthritis   . CAD (coronary artery disease)   . Cancer ([place])    skin  . Cardiomyopathy ([place])   . Depression   . IBS (irritable bowel syndrome) 2010  . Kidney failure July 2012   Hemodialysis 3xweek  . Kidney failure   . Meniere disease   . Meniere's disease   . Myocardial infarction ([place])   . OSA (obstructive sleep apnea)    CPAP  . Peritoneal dialysis status ([place])   . Presence of permanent cardiac pacemaker   . Renal insufficiency     Medications:  Medications Prior to Admission  Medication Sig Dispense Refill Last Dose  . levothyroxine (SYNTHROID, LEVOTHROID) 25 MCG tablet Take 1 tablet (25 mcg total) by mouth daily. (Patient taking differently: Take 25 mcg by mouth daily. (0800)) 90 tablet 1 01/01/2017  . meloxicam (MOBIC)  7.5 MG tablet Take 7.5 mg by mouth daily.   01/01/2017 at Unknown time  . metoprolol succinate (TOPROL XL) 25 MG 24 hr tablet TAKE 1/2 BY MOUTH DAILY (Patient taking differently: Take 25 mg by mouth daily. ) 30 tablet 11 01/01/2017 at Unknown time  . sertraline (ZOLOFT) 50 MG tablet TAKE 3 TABLETS(150 MG) BY MOUTH DAILY 90 tablet 2 01/01/2017 at Unknown time  . Vitamin D, Ergocalciferol, (DRISDOL) 50000 units CAPS capsule Take 50,000 Units by mouth every Monday. (0900)   Past Week at Unknown time  . acetaminophen (TYLENOL) 325 MG tablet Take 2 tablets (650 mg total) by mouth every 4 (four) hours as needed for mild pain or moderate pain.   Taking  . aspirin 325 MG tablet Take by mouth.   Taking  . diazepam (VALIUM) 5 MG tablet    Taking  . diphenoxylate-atropine (LOMOTIL) 2.5-0.025 MG tablet TAKE 1 TABLET BY MOUTH FOUR TIMES DAILY AS NEEDED FOR DIARRHEA OR LOOSE STOOLS (Patient taking differently: TAKE 1 TABLET BY MOUTH every 6 hours AS NEEDED FOR DIARRHEA OR LOOSE STOOLS) 30 tablet 5 Taking  . lidocaine-prilocaine (EMLA) cream Apply 1 application topically every Monday,  Wednesday, and Friday with hemodialysis. (1100) apply to right arm dialysis access pre-dialysis wrap with saran wrap after applying.   Taking  . meclizine (ANTIVERT) 25 MG tablet Take by mouth.   Taking  . metoCLOPramide (REGLAN) 5 MG tablet Take 1 tablet (5 mg total) by mouth every 8 (eight) hours as needed (vertigo). 15 tablet 0 Taking  . metoprolol tartrate (LOPRESSOR) 25 MG tablet    Taking  . mupirocin ointment (BACTROBAN) 2 % Apply 1 application topically 2 (two) times daily. 22 g 0 Taking  . potassium chloride (K-DUR,KLOR-CON) 10 MEQ tablet Take by mouth.   Taking  . traMADol (ULTRAM) 50 MG tablet Take 1 tablet (50 mg total) by mouth every 12 (twelve) hours as needed for severe pain. 10 tablet 0 Taking   Scheduled:  . aspirin EC  325 mg Oral Daily  . levothyroxine  25 mcg Oral QAC breakfast  . metoprolol succinate  25 mg Oral  Daily  . sertraline  100 mg Oral QHS   Infusions:  . heparin 800 Units/hr (01/02/17 0437)   PRN:  Anti-infectives (From admission, onward)   Start     Dose/Rate Route Frequency Ordered Stop   01/01/17 1830  cefTRIAXone (ROCEPHIN) injection 1 g     1 g Intramuscular  Once 01/01/17 1819 01/01/17 1953   01/01/17 1830  azithromycin (ZITHROMAX) 500 mg in dextrose 5 % 250 mL IVPB     500 mg 250 mL/hr over 60 Minutes Intravenous  Once 01/01/17 1819 01/01/17 2107      Assessment: 83 year old female with PE, requiring heparin gtt protocol by pharmacy    Goal of Therapy:  Heparin level 0.3-0.7 units/ml Monitor platelets by anticoagulation protocol: Yes   Plan:  Give 2700 units bolus x 1 Start heparin infusion at 800 units/hr Check anti-Xa level in 8 hours and daily while on heparin Continue to monitor H&H and platelets   12/8 @ 0400 HL unreadable (too high per lab needed to be diluted) Per RN says patient was bleeding out of the right [facility], told RN to hold drip for now and checking stat HL. Hgb also down by 1 unit  Tobie Lords, PharmD, BCPS Clinical Pharmacist 01/02/2017

## 2017-01-02 NOTE — Progress Notes (Addendum)
Dr. Leslye Peer notified by text page nu this RN regarding patient's skin tear bleeding and whether we should stop heparin.

## 2017-01-02 NOTE — Plan of Care (Signed)
Needs reinforcement

## 2017-01-02 NOTE — Consult Note (Signed)
[facility] Cardiology  CARDIOLOGY CONSULT NOTE  Patient ID: Sharon [name] MRN: [id number] DOB/AGE: 1926-07-31 83 y.o.  Admit date: 01/01/2017 Referring Physician Leslye Peer Primary Physician Dina Rich Cardiologist Nehemiah Massed Reason for Consultation syncope  HPI: 83 year old female referred for evaluation of syncope.  The patient has known history of chronic systolic congestive heart failure, sick sinus syndrome status post pacemaker, and end-stage renal disease.  She went to dialysis where she was not feeling well, and sent to [facility] emergency room for apparent syncopal episode.  Patient unable to give any details with regard to syncopal episode.  She does complain of midsternal chest discomfort, pleuritic in nature.  Chest x-ray revealed possible right sided pneumonia with right pleural effusion.  CTA revealed right lower lobe subsegmental pulmonary embolus.  Patient currently on heparin drip initial labs notable for borderline elevated troponin at 0.10 and 0.12.  Review of systems complete and found to be negative unless listed above     Past Medical History:  Diagnosis Date  . Arthritis   . CAD (coronary artery disease)   . Cancer ([place])    skin  . Cardiomyopathy ([place])   . Depression   . IBS (irritable bowel syndrome) 2010  . Kidney failure July 2012   Hemodialysis 3xweek  . Kidney failure   . Meniere disease   . Meniere's disease   . Myocardial infarction ([place])   . OSA (obstructive sleep apnea)    CPAP  . Peritoneal dialysis status ([place])   . Presence of permanent cardiac pacemaker   . Renal insufficiency     Past Surgical History:  Procedure Laterality Date  . [place]  . ANUS SURGERY  2010  . AV FISTULA PLACEMENT Right 04/15/2016   Procedure: ARTERIOVENOUS (AV) FISTULA CREATION ( RADIOCEPHALIC ) STAGE 2;  Surgeon: Algernon Huxley, MD;  Location: [facility] ORS;  Service: Vascular;  Laterality: Right;  . CATARACT EXTRACTION  2006, 2011  . CHOLECYSTECTOMY  01/2008   . DIALYSIS/PERMA CATHETER REMOVAL N/A 08/20/2016   Procedure: Dialysis/Perma Catheter Removal;  Surgeon: Algernon Huxley, MD;  Location: [place] CV LAB;  Service: Cardiovascular;  Laterality: N/A;  . EYE SURGERY     cataracts bilateral  . FEMUR IM NAIL Left 12/15/2015   Procedure: INTRAMEDULLARY (IM) RETROGRADE FEMORAL NAILING;  Surgeon: Oletta Cohn, DO;  Location: [facility] ORS;  Service: Orthopedics;  Laterality: Left;  . HIP PINNING,CANNULATED Left 05/10/2016   Procedure: CANNULATED HIP PINNING;  Surgeon: Earnestine Leys, MD;  Location: [facility] ORS;  Service: Orthopedics;  Laterality: Left;  . INSERTION OF DIALYSIS CATHETER  07/2010  . JOINT REPLACEMENT     left knee  . MINOR REMOVAL OF PERITONEAL DIALYSIS CATHETER  04/15/2016   Procedure: MINOR REMOVAL OF PERITONEAL DIALYSIS CATHETER;  Surgeon: Algernon Huxley, MD;  Location: [facility] ORS;  Service: Vascular;;  . PACEMAKER INSERTION  2006  . PERIPHERAL VASCULAR CATHETERIZATION N/A 12/18/2015   Procedure: Dialysis/Perma Catheter Insertion;  Surgeon: Katha Cabal, MD;  Location: [place] CV LAB;  Service: Cardiovascular;  Laterality: N/A;  . TOTAL KNEE ARTHROPLASTY  2008   LEFT/Dr [place]    Medications Prior to Admission  Medication Sig Dispense Refill Last Dose  . levothyroxine (SYNTHROID, LEVOTHROID) 25 MCG tablet Take 1 tablet (25 mcg total) by mouth daily. (Patient taking differently: Take 25 mcg by mouth daily. (0800)) 90 tablet 1 01/01/2017  . meloxicam (MOBIC) 7.5 MG tablet Take 7.5 mg by mouth daily.   01/01/2017 at Unknown time  .  metoprolol succinate (TOPROL XL) 25 MG 24 hr tablet TAKE 1/2 BY MOUTH DAILY (Patient taking differently: Take 25 mg by mouth daily. ) 30 tablet 11 01/01/2017 at Unknown time  . sertraline (ZOLOFT) 50 MG tablet TAKE 3 TABLETS(150 MG) BY MOUTH DAILY 90 tablet 2 01/01/2017 at Unknown time  . Vitamin D, Ergocalciferol, (DRISDOL) 50000 units CAPS capsule Take 50,000 Units by mouth every Monday. (0900)   Past Week at  Unknown time  . acetaminophen (TYLENOL) 325 MG tablet Take 2 tablets (650 mg total) by mouth every 4 (four) hours as needed for mild pain or moderate pain.   Taking  . aspirin 325 MG tablet Take by mouth.   Taking  . diazepam (VALIUM) 5 MG tablet    Taking  . diphenoxylate-atropine (LOMOTIL) 2.5-0.025 MG tablet TAKE 1 TABLET BY MOUTH FOUR TIMES DAILY AS NEEDED FOR DIARRHEA OR LOOSE STOOLS (Patient taking differently: TAKE 1 TABLET BY MOUTH every 6 hours AS NEEDED FOR DIARRHEA OR LOOSE STOOLS) 30 tablet 5 Taking  . lidocaine-prilocaine (EMLA) cream Apply 1 application topically every Monday, Wednesday, and Friday with hemodialysis. (1100) apply to right arm dialysis access pre-dialysis wrap with saran wrap after applying.   Taking  . meclizine (ANTIVERT) 25 MG tablet Take by mouth.   Taking  . metoCLOPramide (REGLAN) 5 MG tablet Take 1 tablet (5 mg total) by mouth every 8 (eight) hours as needed (vertigo). 15 tablet 0 Taking  . metoprolol tartrate (LOPRESSOR) 25 MG tablet    Taking  . mupirocin ointment (BACTROBAN) 2 % Apply 1 application topically 2 (two) times daily. 22 g 0 Taking  . potassium chloride (K-DUR,KLOR-CON) 10 MEQ tablet Take by mouth.   Taking  . traMADol (ULTRAM) 50 MG tablet Take 1 tablet (50 mg total) by mouth every 12 (twelve) hours as needed for severe pain. 10 tablet 0 Taking   Social History   Socioeconomic History  . Marital status: Widowed    Spouse name: Not on file  . Number of children: 0  . Years of education: Not on file  . Highest education level: Not on file  Social Needs  . Financial resource strain: Not on file  . Food insecurity - worry: Not on file  . Food insecurity - inability: Not on file  . Transportation needs - medical: Not on file  . Transportation needs - non-medical: Not on file  Occupational History  . Occupation: Retired   Tobacco Use  . Smoking status: Former Smoker    Last attempt to quit: 03/31/1948    Years since quitting: 68.8  .  Smokeless tobacco: Never Used  Substance and Sexual Activity  . Alcohol use: No  . Drug use: No  . Sexual activity: No  Other Topics Concern  . Not on file  Social History Narrative   Lives in [place] alone. Widow 2012.      Regular Exercise -  Housework   Daily Caffeine Use:  1 - 2 cups coffee             Family History  Problem Relation Age of Onset  . Cancer Mother        ? ovarian - sarcoma  . Cancer Father        Skin cancer  . Diabetes Sister   . COPD Sister   . Depression Sister   . Cancer Sister        Lung - 24 yrs old      Review of systems complete and  found to be negative unless listed above      PHYSICAL EXAM  General: Well developed, well nourished, in no acute distress HEENT:  Normocephalic and atramatic Neck:  No JVD.  Lungs: Clear bilaterally to auscultation and percussion. Heart: HRRR . Normal S1 and S2 without gallops or murmurs.  Abdomen: Bowel sounds are positive, abdomen soft and non-tender  Msk:  Back normal, normal gait. Normal strength and tone for age. Extremities: No clubbing, cyanosis or edema.   Neuro: Alert and oriented X 3. Psych:  Good affect, responds appropriately  Labs:   Lab Results  Component Value Date   WBC 5.3 01/02/2017   HGB 9.7 (L) 01/02/2017   HCT 28.6 (L) 01/02/2017   MCV 93.0 01/02/2017   PLT 197 01/02/2017    Recent Labs  Lab 01/02/17 0435  NA 139  K 3.1*  CL 100*  CO2 28  BUN 31*  CREATININE 1.96*  CALCIUM 8.7*  GLUCOSE 117*   Lab Results  Component Value Date   TROPONINI 0.12 (HH) 01/02/2017    Lab Results  Component Value Date   CHOL 283 (A) 09/03/2014   Lab Results  Component Value Date   HDL 72 (A) 09/03/2014   Lab Results  Component Value Date   LDLCALC 173 09/03/2014   Lab Results  Component Value Date   TRIG 190 (A) 09/03/2014   No results found for: CHOLHDL No results found for: LDLDIRECT    Radiology: Dg Chest 2 View  Result Date: 01/01/2017 CLINICAL DATA:  Mid  chest pain.  Fall today. EXAM: CHEST  2 VIEW COMPARISON:  One-view chest x-ray 10/11/2016. FINDINGS: The heart is enlarged. A large right pleural effusion and airspace disease is present. Asymmetric edema is present in the right lung. Changes of COPD are noted. The left lung is clear. Pacing wires are in place. Aortic atherosclerosis is present at the aortic arch. The visualized soft tissues and bony thorax are unremarkable. IMPRESSION: 1. Right lower lobe pneumonia and effusion. 2. Asymmetric right-sided edema associated cardiomegaly. 3.  Aortic Atherosclerosis (ICD10-I70.0). Electronically Signed   By: San Morelle M.D.   On: 01/01/2017 18:09   Ct Head Wo Contrast  Result Date: 01/01/2017 CLINICAL DATA:  83 year old female status post fall this morning presents with head trauma and headache. EXAM: CT HEAD WITHOUT CONTRAST TECHNIQUE: Contiguous axial images were obtained from the base of the skull through the vertex without intravenous contrast. COMPARISON:  12/03/2016 FINDINGS: BRAIN: There is chronic mild sulcal and ventricular prominence consistent with superficial and central atrophy. No intraparenchymal hemorrhage, mass effect nor midline shift. Periventricular and subcortical white matter hypodensities consistent with chronic small vessel ischemic disease are identified. No acute large vascular territory infarcts. No abnormal extra-axial fluid collections. Basal cisterns are not effaced and midline. VASCULAR: Moderate calcific atherosclerosis of the carotid siphons, both vertebral arteries and basilar artery. SKULL: No skull fracture. No significant scalp soft tissue swelling. SINUSES/ORBITS: The mastoid air-cells are clear. The included paranasal sinuses are well-aerated.The included ocular globes and orbital contents are non-suspicious. OTHER: None. IMPRESSION: Atrophy with chronic small vessel ischemic disease. No acute intracranial abnormality. No skull fracture. Electronically Signed   By:  Ashley Royalty M.D.   On: 01/01/2017 18:01   Ct Head Wo Contrast  Result Date: 12/03/2016 CLINICAL DATA:  Diplopia.  End-stage renal disease EXAM: CT HEAD WITHOUT CONTRAST TECHNIQUE: Contiguous axial images were obtained from the base of the skull through the vertex without intravenous contrast. COMPARISON:  CT head 10/11/2016 FINDINGS:  Brain: Generalized atrophy. Negative for hydrocephalus. Chronic microvascular ischemic change in the white matter is stable. No acute infarct, hemorrhage, or mass. Vascular: Negative for hyperdense vessel Skull: Negative Sinuses/Orbits: Negative Other: None IMPRESSION: Atrophy and chronic microvascular ischemia.  No acute abnormality. Electronically Signed   By: Franchot Gallo M.D.   On: 12/03/2016 14:03   Ct Angio Chest Pe W And/or Wo Contrast  Result Date: 01/01/2017 CLINICAL DATA:  PE suspected, low pretest probability. Mid chest pain with deep inspiration. EXAM: CT ANGIOGRAPHY CHEST WITH CONTRAST TECHNIQUE: Multidetector CT imaging of the chest was performed using the standard protocol during bolus administration of intravenous contrast. Multiplanar CT image reconstructions and MIPs were obtained to evaluate the vascular anatomy. CONTRAST:  96mL ISOVUE-370 IOPAMIDOL (ISOVUE-370) INJECTION 76% COMPARISON:  Two-view chest x-ray from the same day. FINDINGS: Cardiovascular: The heart is enlarged. The right ventricular ratio is less than 0.9. Coronary artery calcifications are present. Dense calcifications are present at the aortic arch without aneurysm or definite stenosis of the great vessel origins. Pulmonary arterial opacification is excellent. There are filling defects in the lateral posterior basilar segmental artery is of the right lower lobe. Right middle lobe and right upper lobe artery is opacified normally. There significant emboli are present on the left. Mediastinum/Nodes: No significant mediastinal or axillary adenopathy is present. Lungs/Pleura: A large right  pleural effusion is present. There is partial collapse of the right lower lobe. Ground-glass attenuation in the right upper and middle lobe likely reflects atelectasis. Mild left basilar atelectasis is present. Upper Abdomen: Dense vascular calcifications are present. The kidneys are atrophic. Musculoskeletal: Vertebral body heights alignment are normal. No focal lytic or blastic lesions are present. Review of the MIP images confirms the above findings. IMPRESSION: 1. Right lower lobe segmental pulmonary emboli. 2. Large right pleural effusion and partial collapse of the right lower lobe. 3. Cardiomegaly with ground-glass attenuation reflecting atelectasis or edema. 4.  Aortic Atherosclerosis (ICD10-I70.0). 5. Coronary artery disease. Critical Value/emergent results were called by telephone at the time of interpretation on 01/01/2017 at 7:33 pm to Dr. Charlotte Crumb , who verbally acknowledged these results. Electronically Signed   By: San Morelle M.D.   On: 01/01/2017 19:34    EKG: Atrial sensing with ventricular pacing  ASSESSMENT AND PLAN:   1.  Syncope, ill-defined without definite loss of consciousness, likely due to acute pulmonary embolus, paced rhythm on telemetry 2.  Possible pneumonia/right pleural effusion 3.  Chronic systolic congestive heart failure, does not appear fluid overloaded 4.  Sick sinus syndrome, status post pacemaker, pacing appropriately 5.  Borderline elevated troponin, likely due to demand supply ischemia  Recommendations  1.  Agree with overall current therapy 2.  Continue to monitor closely on telemetry 3.  Agree with heparin drip, convert to oral anticoagulant 4.  Review 2D echocardiogram   Signed: Isaias Cowman MD,PhD, [facility] 01/02/2017, 9:03 AM

## 2017-01-02 NOTE — Progress Notes (Addendum)
K+ 3.1 prime paged awaiting call back. troponin increased to 0.12.

## 2017-01-02 NOTE — Progress Notes (Addendum)
This RN paged Dr. Leslye Peer (MD on call at this time/attending provider for pt) to notify him that pt was once again bleeding through pressure dressing placed earlier in shift at skin tear site.  Dr. Leslye Peer gave verbal order to stop heparin gtt at this time due to pt bleeding. Dr. Leslye Peer stated that this had to be a verbal order as he is "not in the hospital" at this time.

## 2017-01-02 NOTE — Progress Notes (Signed)
Pharmacist Shanon Brow called back and to place heparin on hold until new labs are drawn. Heparin stopped.

## 2017-01-02 NOTE — Consult Note (Signed)
Sharon [name] NOTE  Patient Care Team: Kirk Ruths, MD as PCP - General (Internal Medicine) Anthonette Legato, MD (Nephrology) Corey Skains, MD as Consulting Physician (Internal Medicine) Isaias Cowman, MD as Consulting Physician (Cardiology) Murlean Iba, MD (Internal Medicine) Cammie Sickle, MD as Consulting Physician (Internal Medicine)  CHIEF COMPLAINTS/PURPOSE OF CONSULTATION:  PE/anticoagulation  HISTORY OF PRESENTING ILLNESS: Please note-patient is hard of hearing/no family with the bedside.   Sharon [name] 83 y.o.  female patient with multiple medical problems including end-stage renal disease on dialysis currently admitted the hospital for PE.  CTA showed bilateral segmental PE-started on IV heparin.  Patient also noted to have a large sided right pleural effusion-for which thoracentesis is ordered.  Patient complains of right anterior chest wall pain-going on for the last many months.  She does admit to ongoing shortness of breath; chronic mild cough.   Patient appetite is fair.  Denies any significant weight gain or weight loss.  She does complain of easy bruising; bleeding from her dialysis site.  She denies any prior history of blood clots or PEs.   ROS: A complete 10 point review of system is done which is negative except mentioned above in history of present illness  MEDICAL HISTORY:  Past Medical History:  Diagnosis Date  . Arthritis   . CAD (coronary artery disease)   . Cancer ([place])    skin  . Cardiomyopathy ([place])   . Depression   . IBS (irritable bowel syndrome) 2010  . Kidney failure July 2012   Hemodialysis 3xweek  . Kidney failure   . Meniere disease   . Meniere's disease   . Myocardial infarction ([place])   . OSA (obstructive sleep apnea)    CPAP  . Peritoneal dialysis status ([place])   . Presence of permanent cardiac pacemaker   . Renal insufficiency     SURGICAL HISTORY: Past Surgical History:   Procedure Laterality Date  . [place]  . ANUS SURGERY  2010  . AV FISTULA PLACEMENT Right 04/15/2016   Procedure: ARTERIOVENOUS (AV) FISTULA CREATION ( RADIOCEPHALIC ) STAGE 2;  Surgeon: Algernon Huxley, MD;  Location: [facility];  Service: Vascular;  Laterality: Right;  . CATARACT EXTRACTION  2006, 2011  . CHOLECYSTECTOMY  01/2008  . DIALYSIS/PERMA CATHETER REMOVAL N/A 08/20/2016   Procedure: Dialysis/Perma Catheter Removal;  Surgeon: Algernon Huxley, MD;  Location: [facility];  Service: Cardiovascular;  Laterality: N/A;  . EYE SURGERY     cataracts bilateral  . FEMUR IM NAIL Left 12/15/2015   Procedure: INTRAMEDULLARY (IM) RETROGRADE FEMORAL NAILING;  Surgeon: Oletta Cohn, DO;  Location: [facility];  Service: Orthopedics;  Laterality: Left;  . HIP PINNING,CANNULATED Left 05/10/2016   Procedure: CANNULATED HIP PINNING;  Surgeon: Earnestine Leys, MD;  Location: [facility];  Service: Orthopedics;  Laterality: Left;  . INSERTION OF DIALYSIS CATHETER  07/2010  . JOINT REPLACEMENT     left knee  . MINOR REMOVAL OF PERITONEAL DIALYSIS CATHETER  04/15/2016   Procedure: MINOR REMOVAL OF PERITONEAL DIALYSIS CATHETER;  Surgeon: Algernon Huxley, MD;  Location: [facility];  Service: Vascular;;  . PACEMAKER INSERTION  2006  . PERIPHERAL VASCULAR CATHETERIZATION N/A 12/18/2015   Procedure: Dialysis/Perma Catheter Insertion;  Surgeon: Katha Cabal, MD;  Location: [facility];  Service: Cardiovascular;  Laterality: N/A;  . TOTAL KNEE ARTHROPLASTY  2008   LEFT/Dr [place]    SOCIAL HISTORY: Patient lives in [place].  Very remote history of smoking Social History   Socioeconomic History  . Marital status: Widowed    Spouse name: Not on file  . Number of children: 0  . Years of education: Not on file  . Highest education level: Not on file  Social Needs  . Financial resource strain: Not on file  . Food insecurity - worry: Not on file  . Food insecurity - inability:  Not on file  . Transportation needs - medical: Not on file  . Transportation needs - non-medical: Not on file  Occupational History  . Occupation: Retired   Tobacco Use  . Smoking status: Former Smoker    Last attempt to quit: 03/31/1948    Years since quitting: 68.8  . Smokeless tobacco: Never Used  Substance and Sexual Activity  . Alcohol use: No  . Drug use: No  . Sexual activity: No  Other Topics Concern  . Not on file  Social History Narrative   Lives in [place] alone. Widow 2012.      Regular Exercise -  Housework   Daily Caffeine Use:  1 - 2 cups coffee             FAMILY HISTORY: Family History  Problem Relation Age of Onset  . Cancer Mother        ? ovarian - sarcoma  . Cancer Father        Skin cancer  . Diabetes Sister   . COPD Sister   . Depression Sister   . Cancer Sister        Lung - 70 yrs old    ALLERGIES:  is allergic to statins; codeine sulfate; codeine; effexor [venlafaxine]; and ezetimibe-simvastatin.  MEDICATIONS:  Current Facility-Administered Medications  Medication Dose Route Frequency Provider Last Rate Last Dose  . acetaminophen (TYLENOL) tablet 650 mg  650 mg Oral Q6H PRN [name], MD   650 mg at 01/02/17 0426   Or  . acetaminophen (TYLENOL) suppository 650 mg  650 mg Rectal Q6H PRN Sudini, Alveta Heimlich, MD      . albuterol (PROVENTIL) (2.5 MG/3ML) 0.083% nebulizer solution 2.5 mg  2.5 mg Nebulization Q2H PRN Sudini, Alveta Heimlich, MD      . azithromycin (ZITHROMAX) tablet 250 mg  250 mg Oral Daily Loletha Grayer, MD   250 mg at 01/02/17 0848  . cefTRIAXone (ROCEPHIN) 1 g in dextrose 5 % 50 mL IVPB  1 g Intravenous Q24H Wieting, Richard, MD      . diphenoxylate-atropine (LOMOTIL) 2.5-0.025 MG per tablet 1 tablet  1 tablet Oral QID PRN Sudini, Alveta Heimlich, MD      . heparin ADULT infusion 100 units/mL (25000 units/223mL sodium chloride 0.45%)  500 Units/hr Intravenous Continuous Loletha Grayer, MD 5 mL/hr at 01/02/17 0749 500 Units/hr at  01/02/17 0749  . levothyroxine (SYNTHROID, LEVOTHROID) tablet 25 mcg  25 mcg Oral QAC breakfast [name], MD   25 mcg at 01/02/17 0755  . metoCLOPramide (REGLAN) tablet 5 mg  5 mg Oral Q8H PRN Sudini, Alveta Heimlich, MD      . metoprolol succinate (TOPROL-XL) 24 hr tablet 25 mg  25 mg Oral Daily [name], MD   25 mg at 01/02/17 0848  . ondansetron (ZOFRAN) tablet 4 mg  4 mg Oral Q6H PRN [name], MD       Or  . ondansetron (ZOFRAN) injection 4 mg  4 mg Intravenous Q6H PRN [name], MD   4 mg at 01/02/17 0755  . polyethylene glycol (MIRALAX / GLYCOLAX)  packet 17 g  17 g Oral Daily PRN Sudini, Alveta Heimlich, MD      . sertraline (ZOLOFT) tablet 100 mg  100 mg Oral QHS Sudini, Srikar, MD      . traMADol (ULTRAM) tablet 50 mg  50 mg Oral Q6H PRN [name], MD          .  PHYSICAL EXAMINATION:  Vitals:   01/02/17 0604 01/02/17 0740  BP: (!) 140/58 (!) 145/57  Pulse: 66 68  Resp: 17   Temp: (!) 97.5 F (36.4 C) 98.2 F (36.8 C)  SpO2: 96% 100%   Filed Weights   01/01/17 2216 01/02/17 0500  Weight: 95 lb 12.8 oz (43.5 kg) 96 lb 4 oz (43.7 kg)    GENERAL: Thin built cachectic appearing frail Caucasian female patient alert, no distress and comfortable.  Alone. EYES: no pallor or icterus OROPHARYNX: no thrush or ulceration. NECK: supple, no masses felt LYMPH:  no palpable lymphadenopathy in the cervical, axillary or inguinal regions LUNGS: decreased right breath sounds to auscultation at bases and  No wheeze or crackles HEART/CVS: regular rate & rhythm and no murmurs; No lower extremity edema ABDOMEN: abdomen soft, non-tender and normal bowel sounds Musculoskeletal:no cyanosis of digits and no clubbing  PSYCH: alert & oriented x 3 with fluent speech NEURO: no focal motor/sensory deficits SKIN:  multiple ecchymosis noted  LABORATORY DATA:  I have reviewed the data as listed Lab Results  Component Value Date   WBC 5.3 01/02/2017   HGB 9.7 (L) 01/02/2017   HCT 28.6  (L) 01/02/2017   MCV 93.0 01/02/2017   PLT 197 01/02/2017   Recent Labs    05/10/16 0141  05/13/16 0952  09/26/16 0933 10/02/16 1105 10/11/16 1300 01/01/17 1725 01/02/17 0435  NA 136   < > 136   < > 137 141 137 139 139  K 3.6   < > 3.4*   < > 4.3 4.1 3.2* 3.4* 3.1*  CL 100*   < > 103   < > 101 102 97* 100* 100*  CO2 26   < > 23   < > 25 27 30 28 28   GLUCOSE 111*   < > 156*   < > 103* 101* 114* 142* 117*  BUN 46*   < > 42*   < > 41* 25* 24* 21* 31*  CREATININE 2.52*   < > 2.51*   < > 3.17* 2.29* 2.48* 1.47* 1.96*  CALCIUM 9.6   < > 8.8*   < > 8.9 9.3 9.4 8.8* 8.7*  GFRNONAA 16*   < > 16*   < > 12* 18* 16* 31* 22*  GFRAA 19*   < > 19*   < > 14* 21* 19* 36* 25*  PROT 7.2  --   --   --  6.8 6.8  --   --   --   ALBUMIN 3.9   < > 2.9*  --  3.4* 3.4*  --   --   --   AST 39  --   --   --  33 34  --   --   --   ALT 24  --   --   --  21 20  --   --   --   ALKPHOS 150*  --   --   --  143* 152*  --   --   --   BILITOT 0.8  --   --   --  1.6* 1.5*  --   --   --    < > =  values in this interval not displayed.    RADIOGRAPHIC STUDIES: I have personally reviewed the radiological images as listed and agreed with the findings in the report. Dg Chest 2 View  Result Date: 01/01/2017 CLINICAL DATA:  Mid chest pain.  Fall today. EXAM: CHEST  2 VIEW COMPARISON:  One-view chest x-ray 10/11/2016. FINDINGS: The heart is enlarged. A large right pleural effusion and airspace disease is present. Asymmetric edema is present in the right lung. Changes of COPD are noted. The left lung is clear. Pacing wires are in place. Aortic atherosclerosis is present at the aortic arch. The visualized soft tissues and bony thorax are unremarkable. IMPRESSION: 1. Right lower lobe pneumonia and effusion. 2. Asymmetric right-sided edema associated cardiomegaly. 3.  Aortic Atherosclerosis (ICD10-I70.0). Electronically Signed   By: San Morelle M.D.   On: 01/01/2017 18:09   Ct Head Wo Contrast  Result Date:  01/01/2017 CLINICAL DATA:  83 year old female status post fall this morning presents with head trauma and headache. EXAM: CT HEAD WITHOUT CONTRAST TECHNIQUE: Contiguous axial images were obtained from the base of the skull through the vertex without intravenous contrast. COMPARISON:  12/03/2016 FINDINGS: BRAIN: There is chronic mild sulcal and ventricular prominence consistent with superficial and central atrophy. No intraparenchymal hemorrhage, mass effect nor midline shift. Periventricular and subcortical white matter hypodensities consistent with chronic small vessel ischemic disease are identified. No acute large vascular territory infarcts. No abnormal extra-axial fluid collections. Basal cisterns are not effaced and midline. VASCULAR: Moderate calcific atherosclerosis of the carotid siphons, both vertebral arteries and basilar artery. SKULL: No skull fracture. No significant scalp soft tissue swelling. SINUSES/ORBITS: The mastoid air-cells are clear. The included paranasal sinuses are well-aerated.The included ocular globes and orbital contents are non-suspicious. OTHER: None. IMPRESSION: Atrophy with chronic small vessel ischemic disease. No acute intracranial abnormality. No skull fracture. Electronically Signed   By: Ashley Royalty M.D.   On: 01/01/2017 18:01   Ct Angio Chest Pe W And/or Wo Contrast  Result Date: 01/01/2017 CLINICAL DATA:  PE suspected, low pretest probability. Mid chest pain with deep inspiration. EXAM: CT ANGIOGRAPHY CHEST WITH CONTRAST TECHNIQUE: Multidetector CT imaging of the chest was performed using the standard protocol during bolus administration of intravenous contrast. Multiplanar CT image reconstructions and MIPs were obtained to evaluate the vascular anatomy. CONTRAST:  75mL ISOVUE-370 IOPAMIDOL (ISOVUE-370) INJECTION 76% COMPARISON:  Two-view chest x-ray from the same day. FINDINGS: Cardiovascular: The heart is enlarged. The right ventricular ratio is less than 0.9. Coronary  artery calcifications are present. Dense calcifications are present at the aortic arch without aneurysm or definite stenosis of the great vessel origins. Pulmonary arterial opacification is excellent. There are filling defects in the lateral posterior basilar segmental artery is of the right lower lobe. Right middle lobe and right upper lobe artery is opacified normally. There significant emboli are present on the left. Mediastinum/Nodes: No significant mediastinal or axillary adenopathy is present. Lungs/Pleura: A large right pleural effusion is present. There is partial collapse of the right lower lobe. Ground-glass attenuation in the right upper and middle lobe likely reflects atelectasis. Mild left basilar atelectasis is present. Upper Abdomen: Dense vascular calcifications are present. The kidneys are atrophic. Musculoskeletal: Vertebral body heights alignment are normal. No focal lytic or blastic lesions are present. Review of the MIP images confirms the above findings. IMPRESSION: 1. Right lower lobe segmental pulmonary emboli. 2. Large right pleural effusion and partial collapse of the right lower lobe. 3. Cardiomegaly with ground-glass attenuation reflecting atelectasis or edema.  4.  Aortic Atherosclerosis (ICD10-I70.0). 5. Coronary artery disease. Critical Value/emergent results were called by telephone at the time of interpretation on 01/01/2017 at 7:33 pm to Dr. Charlotte Crumb , who verbally acknowledged these results. Electronically Signed   By: San Morelle M.D.   On: 01/01/2017 19:34   US Venous Img Lower Bilateral  Result Date: 01/02/2017 CLINICAL DATA:  Pulmonary embolism EXAM: BILATERAL LOWER EXTREMITY VENOUS DOPPLER ULTRASOUND TECHNIQUE: Gray-scale sonography with compression, as well as color and duplex ultrasound, were performed to evaluate the deep venous system from the level of the common femoral vein through the popliteal and proximal calf veins. Technologist describes technically  limited examination secondary to patient motion and lack of cooperation. COMPARISON:  None FINDINGS: Normal compressibility of the common femoral, superficial femoral, and popliteal veins, as well as the proximal calf veins. No filling defects to suggest DVT on grayscale or color Doppler imaging. Doppler waveforms show normal direction of venous flow, normal respiratory phasicity and response to augmentation. IMPRESSION: No evidence of  lower extremity deep vein thrombosis, bilaterally. Electronically Signed   By: Lucrezia Europe M.D.   On: 01/02/2017 13:29    ASSESSMENT & PLAN:   # 83 year old female patient with multiple medical problems-end-stage renal disease on dialysis; and history of multiple falls-is currently admitted to the hospital for ongoing chest pain/acute PE  # Right and left lower segment PE-currently on IV heparin.  Recommend lower extremity venous Dopplers.  Recommend continued IV heparin.  Once thoracentesis is done-the long-term option for the patient include-Coumadin; and also Eliquis 2.5 mg twice a day given the end-stage renal disease/hemodialysis.  However patient is at risk of falls-which puts her at high risk for bleeding complications and continued anticoagulation.  However short-term anticoagulation 6 weeks- 3 months should be considered.   #Right large pleural effusion-unclear etiology awaiting thoracentesis.  #End-stage renal disease-on hemodialysis; as per nephrology.  # Thank you Dr.Weiting for allowing me to participate in the care of your pleasant patient. Please do not hesitate to contact me with questions or concerns in the interim. Discussed with Dr.Weiting and Dr.Singh.   All questions were answered. The patient knows to call the clinic with any problems, questions or concerns.     Cammie Sickle, MD 01/02/2017 2:06 PM

## 2017-01-02 NOTE — Progress Notes (Signed)
[place], [place] 01/02/17  Subjective:   Patient known to our practice from outpatient dialysis.  She was admitted after completing dialysis yesterday.  She reported a fall at the nursing home.  Patient states that she may have passed out because she does not remember parts of the event.  Patient is a resident of [facility].  She has been falling quite frequently in the recent past.  She complained of chest pain.  A CT angiogram showed a right lower lobe subsegmental pulmonary embolus.  She was started on heparin for anticoagulation.  Patient has problem with skin tears and is bleeding from skin tear sites.  Objective:  Vital signs in last 24 hours:  Temp:  [97.5 F (36.4 C)-98.2 F (36.8 C)] 98.2 F (36.8 C) (12/08 0740) Pulse Rate:  [61-68] 68 (12/08 0740) Resp:  [16-19] 17 (12/08 0604) BP: (120-158)/(57-86) 145/57 (12/08 0740) SpO2:  [96 %-100 %] 100 % (12/08 0740) Weight:  [43.5 kg (95 lb 12.8 oz)-43.7 kg (96 lb 4 oz)] 43.7 kg (96 lb 4 oz) (12/08 0500)  Weight change:  Filed Weights   01/01/17 2216 01/02/17 0500  Weight: 43.5 kg (95 lb 12.8 oz) 43.7 kg (96 lb 4 oz)    Intake/Output:    Intake/Output Summary (Last 24 hours) at 01/02/2017 1231 Last data filed at 01/02/2017 2563 Gross per 24 hour  Intake 68 ml  Output -  Net 68 ml     Physical Exam: General:  Frail, elderly, thin, laying in the bed  HEENT  anicteric, moist oral mucous membranes, decreased hearing  Neck  supple, no masses  Pulm/lungs  decreased breath sounds at bases, no crackles  CVS/Heart  irregular rhythm  Abdomen:   Soft, nontender  Extremities:  No pitting edema  Neurologic:  Alert, able to follow commands  Skin:  Multiple skin tears over IV sites, some bleeding noted  Access:  Right arm AV fistula       Basic Metabolic Panel:  Recent Labs  Lab 01/01/17 1725 01/02/17 0435  NA 139 139  K 3.4* 3.1*  CL 100* 100*  CO2 28 28  GLUCOSE 142* 117*  BUN 21*  31*  CREATININE 1.47* 1.96*  CALCIUM 8.8* 8.7*     CBC: Recent Labs  Lab 01/01/17 1725 01/02/17 0435  WBC 5.0 5.3  NEUTROABS 3.4  --   HGB 10.6* 9.7*  HCT 31.5* 28.6*  MCV 93.7 93.0  PLT 214 197      Lab Results  Component Value Date   HEPBSAG Negative 05/13/2016      Microbiology:  Recent Results (from the past 240 hour(s))  Culture, blood (routine x 2)     Status: None (Preliminary result)   Collection Time: 01/01/17  6:39 PM  Result Value Ref Range Status   Specimen Description BLOOD LEFT ANTECUBITAL  Final   Special Requests   Final    BOTTLES DRAWN AEROBIC AND ANAEROBIC Blood Culture adequate volume   Culture NO GROWTH < 12 HOURS  Final   Report Status PENDING  Incomplete  Culture, blood (routine x 2)     Status: None (Preliminary result)   Collection Time: 01/01/17  6:48 PM  Result Value Ref Range Status   Specimen Description BLOOD LEFT HAND  Final   Special Requests   Final    BOTTLES DRAWN AEROBIC AND ANAEROBIC Blood Culture adequate volume   Culture NO GROWTH < 12 HOURS  Final   Report Status PENDING  Incomplete  MRSA PCR Screening     Status: None   Collection Time: 01/01/17 10:16 PM  Result Value Ref Range Status   MRSA by PCR NEGATIVE NEGATIVE Final    Comment:        The GeneXpert MRSA Assay (FDA approved for NASAL specimens only), is one component of a comprehensive MRSA colonization surveillance program. It is not intended to diagnose MRSA infection nor to guide or monitor treatment for MRSA infections.     Coagulation Studies: Recent Labs    01/01/17 04-29-2130  LABPROT 16.9*  INR 1.39    Urinalysis: No results for input(s): COLORURINE, LABSPEC, PHURINE, GLUCOSEU, HGBUR, BILIRUBINUR, KETONESUR, PROTEINUR, UROBILINOGEN, NITRITE, LEUKOCYTESUR in the last 72 hours.  Invalid input(s): APPERANCEUR    Imaging: Dg Chest 2 View  Result Date: 01/01/2017 CLINICAL DATA:  Mid chest pain.  Fall today. EXAM: CHEST  2 VIEW COMPARISON:   One-view chest x-ray 10/11/2016. FINDINGS: The heart is enlarged. A large right pleural effusion and airspace disease is present. Asymmetric edema is present in the right lung. Changes of COPD are noted. The left lung is clear. Pacing wires are in place. Aortic atherosclerosis is present at the aortic arch. The visualized soft tissues and bony thorax are unremarkable. IMPRESSION: 1. Right lower lobe pneumonia and effusion. 2. Asymmetric right-sided edema associated cardiomegaly. 3.  Aortic Atherosclerosis (ICD10-I70.0). Electronically Signed   By: San Morelle M.D.   On: 01/01/2017 18:09   Ct Head Wo Contrast  Result Date: 01/01/2017 CLINICAL DATA:  83 year old female status post fall this morning presents with head trauma and headache. EXAM: CT HEAD WITHOUT CONTRAST TECHNIQUE: Contiguous axial images were obtained from the base of the skull through the vertex without intravenous contrast. COMPARISON:  12/03/2016 FINDINGS: BRAIN: There is chronic mild sulcal and ventricular prominence consistent with superficial and central atrophy. No intraparenchymal hemorrhage, mass effect nor midline shift. Periventricular and subcortical white matter hypodensities consistent with chronic small vessel ischemic disease are identified. No acute large vascular territory infarcts. No abnormal extra-axial fluid collections. Basal cisterns are not effaced and midline. VASCULAR: Moderate calcific atherosclerosis of the carotid siphons, both vertebral arteries and basilar artery. SKULL: No skull fracture. No significant scalp soft tissue swelling. SINUSES/ORBITS: The mastoid air-cells are clear. The included paranasal sinuses are well-aerated.The included ocular globes and orbital contents are non-suspicious. OTHER: None. IMPRESSION: Atrophy with chronic small vessel ischemic disease. No acute intracranial abnormality. No skull fracture. Electronically Signed   By: Ashley Royalty M.D.   On: 01/01/2017 18:01   Ct Angio Chest  Pe W And/or Wo Contrast  Result Date: 01/01/2017 CLINICAL DATA:  PE suspected, low pretest probability. Mid chest pain with deep inspiration. EXAM: CT ANGIOGRAPHY CHEST WITH CONTRAST TECHNIQUE: Multidetector CT imaging of the chest was performed using the standard protocol during bolus administration of intravenous contrast. Multiplanar CT image reconstructions and MIPs were obtained to evaluate the vascular anatomy. CONTRAST:  64mL ISOVUE-370 IOPAMIDOL (ISOVUE-370) INJECTION 76% COMPARISON:  Two-view chest x-ray from the same day. FINDINGS: Cardiovascular: The heart is enlarged. The right ventricular ratio is less than 0.9. Coronary artery calcifications are present. Dense calcifications are present at the aortic arch without aneurysm or definite stenosis of the great vessel origins. Pulmonary arterial opacification is excellent. There are filling defects in the lateral posterior basilar segmental artery is of the right lower lobe. Right middle lobe and right upper lobe artery is opacified normally. There significant emboli are present on the left. Mediastinum/Nodes: No significant mediastinal or axillary adenopathy is  present. Lungs/Pleura: A large right pleural effusion is present. There is partial collapse of the right lower lobe. Ground-glass attenuation in the right upper and middle lobe likely reflects atelectasis. Mild left basilar atelectasis is present. Upper Abdomen: Dense vascular calcifications are present. The kidneys are atrophic. Musculoskeletal: Vertebral body heights alignment are normal. No focal lytic or blastic lesions are present. Review of the MIP images confirms the above findings. IMPRESSION: 1. Right lower lobe segmental pulmonary emboli. 2. Large right pleural effusion and partial collapse of the right lower lobe. 3. Cardiomegaly with ground-glass attenuation reflecting atelectasis or edema. 4.  Aortic Atherosclerosis (ICD10-I70.0). 5. Coronary artery disease. Critical Value/emergent  results were called by telephone at the time of interpretation on 01/01/2017 at 7:33 pm to Dr. Charlotte Crumb , who verbally acknowledged these results. Electronically Signed   By: San Morelle M.D.   On: 01/01/2017 19:34     Medications:   . cefTRIAXone (ROCEPHIN)  IV    . heparin 500 Units/hr (01/02/17 0749)   . azithromycin  250 mg Oral Daily  . levothyroxine  25 mcg Oral QAC breakfast  . metoprolol succinate  25 mg Oral Daily  . sertraline  100 mg Oral QHS   acetaminophen **OR** acetaminophen, albuterol, diphenoxylate-atropine, metoCLOPramide, ondansetron **OR** ondansetron (ZOFRAN) IV, polyethylene glycol, traMADol  Assessment/ Plan:  83 y.o. Caucasian female with end-stage renal disease, left femur fracture, anemia, hypertension, coronary disease, atherosclerosis, cardiomyopathy, history of peritoneal dialysis presents for evaluation of fall after syncope  [facility]/MWF/[facility]/195 minutes / 43.5 kg  1.  end-stage renal disease  2.  Anemia of chronic kidney disease 3.  Secondary hyperparathyroidism 4.  Right lower lobe subsegmental pulmonary embolus 5.  Syncope 6.  Hypokalemia  Evaluation of syncope is in progress.  Patient is being given anticoagulation with IV heparin for pulmonary embolus however she has bleeding from various skin sites.  Hemoglobin is being monitored.  Volume status and electrolytes are acceptable.  No acute indication for dialysis at present.  Next dialysis will be scheduled on Monday.  Patient was on Valium as outpatient.  Minimize use of diazepam.  Replace potassium as necessary.    LOS: 0 Merced Hanners 12/8/201812:31 PM  [facility] [place], [place]

## 2017-01-02 NOTE — Progress Notes (Signed)
Patient ID: Sharon [name], female   DOB: Aug 03, 1926, 83 y.o.   MRN: [id number]  [facility] PROGRESS NOTE  MILICA GULLY LTJ:[id number] DOB: [birth year] DOA: 01/01/2017 PCP: Kirk Ruths, MD  HPI/Subjective: Patient had some bleeding from the skin tear and from her dialysis access last night.  Patient does feel some chest pain and shortness of breath.  Objective: Vitals:   01/02/17 0604 01/02/17 0740  BP: (!) 140/58 (!) 145/57  Pulse: 66 68  Resp: 17   Temp: (!) 97.5 F (36.4 C) 98.2 F (36.8 C)  SpO2: 96% 100%    Filed Weights   01/01/17 2216 01/02/17 0500  Weight: 43.5 kg (95 lb 12.8 oz) 43.7 kg (96 lb 4 oz)    ROS: Review of Systems  Constitutional: Negative for chills and fever.  Eyes: Negative for blurred vision.  Respiratory: Positive for shortness of breath. Negative for cough.   Cardiovascular: Positive for chest pain.  Gastrointestinal: Negative for abdominal pain, constipation, diarrhea, nausea and vomiting.  Genitourinary: Negative for dysuria.  Musculoskeletal: Negative for joint pain.  Neurological: Negative for dizziness and headaches.   Exam: Physical Exam  Constitutional: She is oriented to person, place, and time.  HENT:  Nose: No mucosal edema.  Mouth/Throat: No oropharyngeal exudate or posterior oropharyngeal edema.  Eyes: Conjunctivae, EOM and lids are normal. Pupils are equal, round, and reactive to light.  Neck: No JVD present. Carotid bruit is not present. No edema present. No thyroid mass and no thyromegaly present.  Cardiovascular: S1 normal and S2 normal. Exam reveals no gallop.  No murmur heard. Pulses:      Dorsalis pedis pulses are 2+ on the right side, and 2+ on the left side.  Respiratory: No respiratory distress. She has decreased breath sounds in the right middle field, the right lower field and the left lower field. She has no wheezes. She has rhonchi in the right middle field, the right lower field and the left lower  field.  GI: Soft. Bowel sounds are normal. There is no tenderness.  Musculoskeletal:       Right ankle: She exhibits no swelling.       Left ankle: She exhibits no swelling.  Lymphadenopathy:    She has no cervical adenopathy.  Neurological: She is alert and oriented to person, place, and time. No cranial nerve deficit.  Skin: Skin is warm. No rash noted. Nails show no clubbing.  Bleeding stopped at dialysis access.  Pressure dressing over the left arm skin tear.  Psychiatric: She has a normal mood and affect.      Data Reviewed: Basic Metabolic Panel: Recent Labs  Lab 01/01/17 1725 01/02/17 0435  NA 139 139  K 3.4* 3.1*  CL 100* 100*  CO2 28 28  GLUCOSE 142* 117*  BUN 21* 31*  CREATININE 1.47* 1.96*  CALCIUM 8.8* 8.7*   CBC: Recent Labs  Lab 01/01/17 1725 01/02/17 0435  WBC 5.0 5.3  NEUTROABS 3.4  --   HGB 10.6* 9.7*  HCT 31.5* 28.6*  MCV 93.7 93.0  PLT 214 197   Cardiac Enzymes: Recent Labs  Lab 01/01/17 1725 01/01/17 2134 01/02/17 0435  TROPONINI 0.10* 0.10* 0.12*     Recent Results (from the past 240 hour(s))  Culture, blood (routine x 2)     Status: None (Preliminary result)   Collection Time: 01/01/17  6:39 PM  Result Value Ref Range Status   Specimen Description BLOOD LEFT ANTECUBITAL  Final   Special Requests  Final    BOTTLES DRAWN AEROBIC AND ANAEROBIC Blood Culture adequate volume   Culture NO GROWTH < 12 HOURS  Final   Report Status PENDING  Incomplete  Culture, blood (routine x 2)     Status: None (Preliminary result)   Collection Time: 01/01/17  6:48 PM  Result Value Ref Range Status   Specimen Description BLOOD LEFT HAND  Final   Special Requests   Final    BOTTLES DRAWN AEROBIC AND ANAEROBIC Blood Culture adequate volume   Culture NO GROWTH < 12 HOURS  Final   Report Status PENDING  Incomplete  MRSA PCR Screening     Status: None   Collection Time: 01/01/17 10:16 PM  Result Value Ref Range Status   MRSA by PCR NEGATIVE NEGATIVE  Final    Comment:        The GeneXpert MRSA Assay (FDA approved for NASAL specimens only), is one component of a comprehensive MRSA colonization surveillance program. It is not intended to diagnose MRSA infection nor to guide or monitor treatment for MRSA infections.      Studies: Dg Chest 2 View  Result Date: 01/01/2017 CLINICAL DATA:  Mid chest pain.  Fall today. EXAM: CHEST  2 VIEW COMPARISON:  One-view chest x-ray 10/11/2016. FINDINGS: The heart is enlarged. A large right pleural effusion and airspace disease is present. Asymmetric edema is present in the right lung. Changes of COPD are noted. The left lung is clear. Pacing wires are in place. Aortic atherosclerosis is present at the aortic arch. The visualized soft tissues and bony thorax are unremarkable. IMPRESSION: 1. Right lower lobe pneumonia and effusion. 2. Asymmetric right-sided edema associated cardiomegaly. 3.  Aortic Atherosclerosis (ICD10-I70.0). Electronically Signed   By: San Morelle M.D.   On: 01/01/2017 18:09   Ct Head Wo Contrast  Result Date: 01/01/2017 CLINICAL DATA:  83 year old female status post fall this morning presents with head trauma and headache. EXAM: CT HEAD WITHOUT CONTRAST TECHNIQUE: Contiguous axial images were obtained from the base of the skull through the vertex without intravenous contrast. COMPARISON:  12/03/2016 FINDINGS: BRAIN: There is chronic mild sulcal and ventricular prominence consistent with superficial and central atrophy. No intraparenchymal hemorrhage, mass effect nor midline shift. Periventricular and subcortical white matter hypodensities consistent with chronic small vessel ischemic disease are identified. No acute large vascular territory infarcts. No abnormal extra-axial fluid collections. Basal cisterns are not effaced and midline. VASCULAR: Moderate calcific atherosclerosis of the carotid siphons, both vertebral arteries and basilar artery. SKULL: No skull fracture. No  significant scalp soft tissue swelling. SINUSES/ORBITS: The mastoid air-cells are clear. The included paranasal sinuses are well-aerated.The included ocular globes and orbital contents are non-suspicious. OTHER: None. IMPRESSION: Atrophy with chronic small vessel ischemic disease. No acute intracranial abnormality. No skull fracture. Electronically Signed   By: Ashley Royalty M.D.   On: 01/01/2017 18:01   Ct Angio Chest Pe W And/or Wo Contrast  Result Date: 01/01/2017 CLINICAL DATA:  PE suspected, low pretest probability. Mid chest pain with deep inspiration. EXAM: CT ANGIOGRAPHY CHEST WITH CONTRAST TECHNIQUE: Multidetector CT imaging of the chest was performed using the standard protocol during bolus administration of intravenous contrast. Multiplanar CT image reconstructions and MIPs were obtained to evaluate the vascular anatomy. CONTRAST:  67mL ISOVUE-370 IOPAMIDOL (ISOVUE-370) INJECTION 76% COMPARISON:  Two-view chest x-ray from the same day. FINDINGS: Cardiovascular: The heart is enlarged. The right ventricular ratio is less than 0.9. Coronary artery calcifications are present. Dense calcifications are present at the aortic arch  without aneurysm or definite stenosis of the great vessel origins. Pulmonary arterial opacification is excellent. There are filling defects in the lateral posterior basilar segmental artery is of the right lower lobe. Right middle lobe and right upper lobe artery is opacified normally. There significant emboli are present on the left. Mediastinum/Nodes: No significant mediastinal or axillary adenopathy is present. Lungs/Pleura: A large right pleural effusion is present. There is partial collapse of the right lower lobe. Ground-glass attenuation in the right upper and middle lobe likely reflects atelectasis. Mild left basilar atelectasis is present. Upper Abdomen: Dense vascular calcifications are present. The kidneys are atrophic. Musculoskeletal: Vertebral body heights alignment are  normal. No focal lytic or blastic lesions are present. Review of the MIP images confirms the above findings. IMPRESSION: 1. Right lower lobe segmental pulmonary emboli. 2. Large right pleural effusion and partial collapse of the right lower lobe. 3. Cardiomegaly with ground-glass attenuation reflecting atelectasis or edema. 4.  Aortic Atherosclerosis (ICD10-I70.0). 5. Coronary artery disease. Critical Value/emergent results were called by telephone at the time of interpretation on 01/01/2017 at 7:33 pm to Dr. Charlotte Crumb , who verbally acknowledged these results. Electronically Signed   By: San Morelle M.D.   On: 01/01/2017 19:34   US Venous Img Lower Bilateral  Result Date: 01/02/2017 CLINICAL DATA:  Pulmonary embolism EXAM: BILATERAL LOWER EXTREMITY VENOUS DOPPLER ULTRASOUND TECHNIQUE: Gray-scale sonography with compression, as well as color and duplex ultrasound, were performed to evaluate the deep venous system from the level of the common femoral vein through the popliteal and proximal calf veins. Technologist describes technically limited examination secondary to patient motion and lack of cooperation. COMPARISON:  None FINDINGS: Normal compressibility of the common femoral, superficial femoral, and popliteal veins, as well as the proximal calf veins. No filling defects to suggest DVT on grayscale or color Doppler imaging. Doppler waveforms show normal direction of venous flow, normal respiratory phasicity and response to augmentation. IMPRESSION: No evidence of  lower extremity deep vein thrombosis, bilaterally. Electronically Signed   By: Lucrezia Europe M.D.   On: 01/02/2017 13:29    Scheduled Meds: . azithromycin  250 mg Oral Daily  . levothyroxine  25 mcg Oral QAC breakfast  . metoprolol succinate  25 mg Oral Daily  . sertraline  100 mg Oral QHS   Continuous Infusions: . cefTRIAXone (ROCEPHIN)  IV    . heparin 500 Units/hr (01/02/17 0749)    Assessment/Plan:  1. Right lower lobe  pulmonary embolism with syncope.  Patient on heparin drip. Patient had a complicating bleeding from her dialysis access and skin tear on her left arm.  Check another hemoglobin at 1 PM.  Hopefully will be able to switch over the low-dose Eliquis as per hematology.  Want to monitor the patient's response to bleeding at her sites before I make this switch.  Ultrasound lower extremity negative for DVT 2. Large right pleural effusion.  Case discussed with interventional radiology and thoracentesis will not be able to be done until Monday. 3. Questionable pneumonia on CT scan on Rocephin and Zithromax. 4. End-stage renal disease on hemodialysis 5. Hypothyroidism unspecified on levothyroxine 6. History of CAD.  Since the patient is on heparin drip, I will hold the patient's aspirin at this point.  Continue metoprolol.  Code Status:     Code Status Orders  (From admission, onward)        Start     Ordered   01/01/17 2025  Full code  Continuous     01/01/17  2027    Code Status History    Date Active Date Inactive Code Status Order ID Comments User Context   05/12/2016 08:09 05/14/2016 14:39 DNR [id number]  Max Sane, MD Inpatient   05/10/2016 05:06 05/12/2016 08:09 Full Code [id number]  Saundra Shelling, MD Inpatient   12/15/2015 15:15 12/19/2015 01:59 Full Code [id number]  Oletta Cohn, DO Inpatient   12/14/2015 16:13 12/15/2015 15:15 Full Code [id number]  Bettey Costa, MD Inpatient     Family Communication: Patient wanted me to speak medically in front of her pastor Disposition Plan: To be determined  Consultants:  Nephrology  Cardiology  Oncology  Antibiotics:  Rocephin  Zithromax  Time spent: 28 minutes  [place]

## 2017-01-03 ENCOUNTER — Inpatient Hospital Stay: Payer: Medicare Other

## 2017-01-03 LAB — CBC
HEMATOCRIT: 27 % — AB (ref 35.0–47.0)
Hemoglobin: 9 g/dL — ABNORMAL LOW (ref 12.0–16.0)
MCH: 31.8 pg (ref 26.0–34.0)
MCHC: 33.5 g/dL (ref 32.0–36.0)
MCV: 95 fL (ref 80.0–100.0)
Platelets: 200 10*3/uL (ref 150–440)
RBC: 2.84 MIL/uL — ABNORMAL LOW (ref 3.80–5.20)
RDW: 17.1 % — AB (ref 11.5–14.5)
WBC: 5.9 10*3/uL (ref 3.6–11.0)

## 2017-01-03 MED ORDER — RAMIPRIL 2.5 MG PO CAPS
2.5000 mg | ORAL_CAPSULE | Freq: Every day | ORAL | Status: DC
Start: 2017-01-03 — End: 2017-01-08
  Administered 2017-01-03 – 2017-01-07 (×4): 2.5 mg via ORAL
  Filled 2017-01-03 (×6): qty 1

## 2017-01-03 NOTE — Progress Notes (Signed)
Patient ID: Sharon [name], female   DOB: 01-16-1927, 83 y.o.   MRN: [id number]  [facility] PROGRESS NOTE  Sharon [name]:[id number] DOB: [birth year] DOA: 01/01/2017 PCP: Kirk Ruths, MD  HPI/Subjective: Patient still having chest pain over the sternal area.  Patient still short of breath but no worsening shortness of breath since I stop the heparin yesterday.  I needed to stop the heparin secondary to continued bleeding at dialysis site and left arm tear and IV site.  Objective: Vitals:   01/02/17 1948 01/03/17 0436  BP: (!) 103/34 (!) 127/54  Pulse: 60 61  Resp: 17 17  Temp: 98.2 F (36.8 C) (!) 97.5 F (36.4 C)  SpO2: 91% 94%    Filed Weights   01/01/17 2216 01/02/17 0500 01/03/17 0436  Weight: 43.5 kg (95 lb 12.8 oz) 43.7 kg (96 lb 4 oz) 41.7 kg (92 lb)    ROS: Review of Systems  Constitutional: Negative for chills and fever.  Eyes: Negative for blurred vision.  Respiratory: Positive for shortness of breath. Negative for cough.   Cardiovascular: Positive for chest pain.  Gastrointestinal: Negative for abdominal pain, constipation, diarrhea, nausea and vomiting.  Genitourinary: Negative for dysuria.  Musculoskeletal: Negative for joint pain.  Neurological: Negative for dizziness and headaches.   Exam: Physical Exam  Constitutional: She is oriented to person, place, and time.  HENT:  Nose: No mucosal edema.  Mouth/Throat: No oropharyngeal exudate or posterior oropharyngeal edema.  Eyes: Conjunctivae, EOM and lids are normal. Pupils are equal, round, and reactive to light.  Neck: No JVD present. Carotid bruit is not present. No edema present. No thyroid mass and no thyromegaly present.  Cardiovascular: S1 normal and S2 normal. Exam reveals no gallop.  No murmur heard. Pulses:      Dorsalis pedis pulses are 2+ on the right side, and 2+ on the left side.  Respiratory: No respiratory distress. She has decreased breath sounds in the right middle field,  the right lower field and the left lower field. She has no wheezes. She has rhonchi in the right middle field, the right lower field and the left lower field.  GI: Soft. Bowel sounds are normal. There is no tenderness.  Musculoskeletal:       Right ankle: She exhibits no swelling.       Left ankle: She exhibits no swelling.  Lymphadenopathy:    She has no cervical adenopathy.  Neurological: She is alert and oriented to person, place, and time. No cranial nerve deficit.  Skin: Skin is warm. Nails show no clubbing.  Small spot of oozing at dialysis catheter site today.  Left arm skin tear and prior IV site does have dried blood on the dressing but the active bleeding seems to have stopped.  Psychiatric: She has a normal mood and affect.      Data Reviewed: Basic Metabolic Panel: Recent Labs  Lab 01/01/17 1725 01/02/17 0435  NA 139 139  K 3.4* 3.1*  CL 100* 100*  CO2 28 28  GLUCOSE 142* 117*  BUN 21* 31*  CREATININE 1.47* 1.96*  CALCIUM 8.8* 8.7*   CBC: Recent Labs  Lab 01/01/17 1725 01/02/17 0435 01/02/17 1415 01/03/17 0554  WBC 5.0 5.3  --  5.9  NEUTROABS 3.4  --   --   --   HGB 10.6* 9.7* 9.9* 9.0*  HCT 31.5* 28.6*  --  27.0*  MCV 93.7 93.0  --  95.0  PLT 214 197  --  200  Cardiac Enzymes: Recent Labs  Lab 01/01/17 1725 01/01/17 2134 01/02/17 0435  TROPONINI 0.10* 0.10* 0.12*     Recent Results (from the past 240 hour(s))  Culture, blood (routine x 2)     Status: None (Preliminary result)   Collection Time: 01/01/17  6:39 PM  Result Value Ref Range Status   Specimen Description BLOOD LEFT ANTECUBITAL  Final   Special Requests   Final    BOTTLES DRAWN AEROBIC AND ANAEROBIC Blood Culture adequate volume   Culture NO GROWTH 2 DAYS  Final   Report Status PENDING  Incomplete  Culture, blood (routine x 2)     Status: None (Preliminary result)   Collection Time: 01/01/17  6:48 PM  Result Value Ref Range Status   Specimen Description BLOOD LEFT HAND  Final    Special Requests   Final    BOTTLES DRAWN AEROBIC AND ANAEROBIC Blood Culture adequate volume   Culture NO GROWTH 2 DAYS  Final   Report Status PENDING  Incomplete  MRSA PCR Screening     Status: None   Collection Time: 01/01/17 10:16 PM  Result Value Ref Range Status   MRSA by PCR NEGATIVE NEGATIVE Final    Comment:        The GeneXpert MRSA Assay (FDA approved for NASAL specimens only), is one component of a comprehensive MRSA colonization surveillance program. It is not intended to diagnose MRSA infection nor to guide or monitor treatment for MRSA infections.      Studies: Dg Chest 2 View  Result Date: 01/01/2017 CLINICAL DATA:  Mid chest pain.  Fall today. EXAM: CHEST  2 VIEW COMPARISON:  One-view chest x-ray 10/11/2016. FINDINGS: The heart is enlarged. A large right pleural effusion and airspace disease is present. Asymmetric edema is present in the right lung. Changes of COPD are noted. The left lung is clear. Pacing wires are in place. Aortic atherosclerosis is present at the aortic arch. The visualized soft tissues and bony thorax are unremarkable. IMPRESSION: 1. Right lower lobe pneumonia and effusion. 2. Asymmetric right-sided edema associated cardiomegaly. 3.  Aortic Atherosclerosis (ICD10-I70.0). Electronically Signed   By: San Morelle M.D.   On: 01/01/2017 18:09   Ct Head Wo Contrast  Result Date: 01/01/2017 CLINICAL DATA:  83 year old female status post fall this morning presents with head trauma and headache. EXAM: CT HEAD WITHOUT CONTRAST TECHNIQUE: Contiguous axial images were obtained from the base of the skull through the vertex without intravenous contrast. COMPARISON:  12/03/2016 FINDINGS: BRAIN: There is chronic mild sulcal and ventricular prominence consistent with superficial and central atrophy. No intraparenchymal hemorrhage, mass effect nor midline shift. Periventricular and subcortical white matter hypodensities consistent with chronic small vessel  ischemic disease are identified. No acute large vascular territory infarcts. No abnormal extra-axial fluid collections. Basal cisterns are not effaced and midline. VASCULAR: Moderate calcific atherosclerosis of the carotid siphons, both vertebral arteries and basilar artery. SKULL: No skull fracture. No significant scalp soft tissue swelling. SINUSES/ORBITS: The mastoid air-cells are clear. The included paranasal sinuses are well-aerated.The included ocular globes and orbital contents are non-suspicious. OTHER: None. IMPRESSION: Atrophy with chronic small vessel ischemic disease. No acute intracranial abnormality. No skull fracture. Electronically Signed   By: Ashley Royalty M.D.   On: 01/01/2017 18:01   Ct Angio Chest Pe W And/or Wo Contrast  Result Date: 01/01/2017 CLINICAL DATA:  PE suspected, low pretest probability. Mid chest pain with deep inspiration. EXAM: CT ANGIOGRAPHY CHEST WITH CONTRAST TECHNIQUE: Multidetector CT imaging of the chest  was performed using the standard protocol during bolus administration of intravenous contrast. Multiplanar CT image reconstructions and MIPs were obtained to evaluate the vascular anatomy. CONTRAST:  20mL ISOVUE-370 IOPAMIDOL (ISOVUE-370) INJECTION 76% COMPARISON:  Two-view chest x-ray from the same day. FINDINGS: Cardiovascular: The heart is enlarged. The right ventricular ratio is less than 0.9. Coronary artery calcifications are present. Dense calcifications are present at the aortic arch without aneurysm or definite stenosis of the great vessel origins. Pulmonary arterial opacification is excellent. There are filling defects in the lateral posterior basilar segmental artery is of the right lower lobe. Right middle lobe and right upper lobe artery is opacified normally. There significant emboli are present on the left. Mediastinum/Nodes: No significant mediastinal or axillary adenopathy is present. Lungs/Pleura: A large right pleural effusion is present. There is  partial collapse of the right lower lobe. Ground-glass attenuation in the right upper and middle lobe likely reflects atelectasis. Mild left basilar atelectasis is present. Upper Abdomen: Dense vascular calcifications are present. The kidneys are atrophic. Musculoskeletal: Vertebral body heights alignment are normal. No focal lytic or blastic lesions are present. Review of the MIP images confirms the above findings. IMPRESSION: 1. Right lower lobe segmental pulmonary emboli. 2. Large right pleural effusion and partial collapse of the right lower lobe. 3. Cardiomegaly with ground-glass attenuation reflecting atelectasis or edema. 4.  Aortic Atherosclerosis (ICD10-I70.0). 5. Coronary artery disease. Critical Value/emergent results were called by telephone at the time of interpretation on 01/01/2017 at 7:33 pm to Dr. Charlotte Crumb , who verbally acknowledged these results. Electronically Signed   By: San Morelle M.D.   On: 01/01/2017 19:34   US Venous Img Lower Bilateral  Result Date: 01/02/2017 CLINICAL DATA:  Pulmonary embolism EXAM: BILATERAL LOWER EXTREMITY VENOUS DOPPLER ULTRASOUND TECHNIQUE: Gray-scale sonography with compression, as well as color and duplex ultrasound, were performed to evaluate the deep venous system from the level of the common femoral vein through the popliteal and proximal calf veins. Technologist describes technically limited examination secondary to patient motion and lack of cooperation. COMPARISON:  None FINDINGS: Normal compressibility of the common femoral, superficial femoral, and popliteal veins, as well as the proximal calf veins. No filling defects to suggest DVT on grayscale or color Doppler imaging. Doppler waveforms show normal direction of venous flow, normal respiratory phasicity and response to augmentation. IMPRESSION: No evidence of  lower extremity deep vein thrombosis, bilaterally. Electronically Signed   By: Lucrezia Europe M.D.   On: 01/02/2017 13:29     Scheduled Meds: . azithromycin  250 mg Oral Daily  . levothyroxine  25 mcg Oral QAC breakfast  . metoprolol succinate  25 mg Oral Daily  . sertraline  100 mg Oral QHS   Continuous Infusions: . cefTRIAXone (ROCEPHIN)  IV Stopped (01/02/17 1725)    Assessment/Plan:  1. Right lower lobe pulmonary embolism with syncope.  I stopped heparin drip secondary to continued bleeding at dialysis fistula site and left arm skin tear and IV site.  Areas seem a little bit better today.  I will probably restart low-dose Eliquis tomorrow evening after thoracentesis and dialysis done. Ultrasound lower extremity negative for DVT.  Patient not a candidate for IVC filter with no DVT of the lower extremity.  If she is unable to tolerate blood thinner, secondary to bleeding than overall prognosis is poor. 2. Large right pleural effusion.  Thoracentesis for Monday. 3. Questionable pneumonia on CT scan on Rocephin and Zithromax. 4. End-stage renal disease on hemodialysis 5. Hypothyroidism unspecified on levothyroxine  6. History of CAD.  Metoprolol. 7. Severe cardiomyopathy.  Add low-dose Altace at night.  On metoprolol.  Dialysis to manage fluid. 8. Pain in the sternum.  We will get an x-ray of the sternum to see if she has a fracture with her fall. 9. Weakness.  Physical therapy evaluation.  Code Status:     Code Status Orders  (From admission, onward)        Start     Ordered   01/01/17 2025  Full code  Continuous     01/01/17 2027    Code Status History    Date Active Date Inactive Code Status Order ID Comments User Context   05/12/2016 08:09 05/14/2016 14:39 DNR [id number]  Max Sane, MD Inpatient   05/10/2016 05:06 05/12/2016 08:09 Full Code [id number]  Saundra Shelling, MD Inpatient   12/15/2015 15:15 12/19/2015 01:59 Full Code [id number]  Oletta Cohn, DO Inpatient   12/14/2015 16:13 12/15/2015 15:15 Full Code [id number]  Bettey Costa, MD Inpatient     Family Communication: I spoke with her  brother on the phone. Disposition Plan: We will need to make sure that she tolerates blood thinner prior to disposition.  Consultants:  Nephrology  Cardiology  Oncology  Antibiotics:  Rocephin  Zithromax  Time spent: 28 minutes, spoke with brother on the phone.  Kinnie Kaupp [employer]

## 2017-01-03 NOTE — Progress Notes (Signed)
[facility] Card[facility]  SUBJECTIVE: Patient sitting on side of bed, eating breakfast, reports feeling better, with residual pleuritic chest pain   Vitals:   01/02/17 1659 01/02/17 1948 01/03/17 0436 01/03/17 0812  BP: (!) 135/98 (!) 103/34 (!) 127/54 (!) 136/51  Pulse: (!) 59 60 61 60  Resp: 18 17 17 16   Temp:  98.2 F (36.8 C) (!) 97.5 F (36.4 C)   TempSrc:  Oral Oral   SpO2: 94% 91% 94% 100%  Weight:   41.7 kg (92 lb)   Height:         Intake/Output Summary (Last 24 hours) at 01/03/2017 0109 Last data filed at 01/02/2017 2238 Gross per 24 hour  Intake 85.92 ml  Output 500 ml  Net -414.08 ml      PHYSICAL EXAM  General: Well developed, well nourished, in no acute distress HEENT:  Normocephalic and atramatic Neck:  No JVD.  Lungs: Clear bilaterally to auscultation and percussion. Heart: HRRR . Normal S1 and S2 without gallops or murmurs.  Abdomen: Bowel sounds are positive, abdomen soft and non-tender  Msk:  Back normal, normal gait. Normal strength and tone for age. Extremities: No clubbing, cyanosis or edema.   Neuro: Alert and oriented X 3. Psych:  Good affect, responds appropriately   LABS: Basic Metabolic Panel: Recent Labs    01/01/17 1725 01/02/17 0435  NA 139 139  K 3.4* 3.1*  CL 100* 100*  CO2 28 28  GLUCOSE 142* 117*  BUN 21* 31*  CREATININE 1.47* 1.96*  CALCIUM 8.8* 8.7*   Liver Function Tests: No results for input(s): AST, ALT, ALKPHOS, BILITOT, PROT, ALBUMIN in the last 72 hours. No results for input(s): LIPASE, AMYLASE in the last 72 hours. CBC: Recent Labs    01/01/17 1725 01/02/17 0435 01/02/17 1415 01/03/17 0554  WBC 5.0 5.3  --  5.9  NEUTROABS 3.4  --   --   --   HGB 10.6* 9.7* 9.9* 9.0*  HCT 31.5* 28.6*  --  27.0*  MCV 93.7 93.0  --  95.0  PLT 214 197  --  200   Cardiac Enzymes: Recent Labs    01/01/17 1725 01/01/17 2134 01/02/17 0435  TROPONINI 0.10* 0.10* 0.12*   BNP: Invalid input(s): POCBNP D-Dimer: No results for  input(s): DDIMER in the last 72 hours. Hemoglobin A1C: No results for input(s): HGBA1C in the last 72 hours. Fasting Lipid Panel: No results for input(s): CHOL, HDL, LDLCALC, TRIG, CHOLHDL, LDLDIRECT in the last 72 hours. Thyroid Function Tests: No results for input(s): TSH, T4TOTAL, T3FREE, THYROIDAB in the last 72 hours.  Invalid input(s): FREET3 Anemia Panel: No results for input(s): VITAMINB12, FOLATE, FERRITIN, TIBC, IRON, RETICCTPCT in the last 72 hours.  Dg Chest 2 View  Result Date: 01/01/2017 CLINICAL DATA:  Mid chest pain.  Fall today. EXAM: CHEST  2 VIEW COMPARISON:  One-view chest x-ray 10/11/2016. FINDINGS: The heart is enlarged. A large right pleural effusion and airspace disease is present. Asymmetric edema is present in the right lung. Changes of COPD are noted. The left lung is clear. Pacing wires are in place. Aortic atherosclerosis is present at the aortic arch. The visualized soft tissues and bony thorax are unremarkable. IMPRESSION: 1. Right lower lobe pneumonia and effusion. 2. Asymmetric right-sided edema associated cardiomegaly. 3.  Aortic Atherosclerosis (ICD10-I70.0). Electronically Signed   By: San Morelle M.D.   On: 01/01/2017 18:09   Ct Head Wo Contrast  Result Date: 01/01/2017 CLINICAL DATA:  83 year old female status post fall  this morning presents with head trauma and headache. EXAM: CT HEAD WITHOUT CONTRAST TECHNIQUE: Contiguous axial images were obtained from the base of the skull through the vertex without intravenous contrast. COMPARISON:  12/03/2016 FINDINGS: BRAIN: There is chronic mild sulcal and ventricular prominence consistent with superficial and central atrophy. No intraparenchymal hemorrhage, mass effect nor midline shift. Periventricular and subcortical white matter hypodensities consistent with chronic small vessel ischemic disease are identified. No acute large vascular territory infarcts. No abnormal extra-axial fluid collections. Basal  cisterns are not effaced and midline. VASCULAR: Moderate calcific atherosclerosis of the carotid siphons, both vertebral arteries and basilar artery. SKULL: No skull fracture. No significant scalp soft tissue swelling. SINUSES/ORBITS: The mastoid air-cells are clear. The included paranasal sinuses are well-aerated.The included ocular globes and orbital contents are non-suspicious. OTHER: None. IMPRESSION: Atrophy with chronic small vessel ischemic disease. No acute intracranial abnormality. No skull fracture. Electronically Signed   By: Ashley Royalty M.D.   On: 01/01/2017 18:01   Ct Angio Chest Pe W And/or Wo Contrast  Result Date: 01/01/2017 CLINICAL DATA:  PE suspected, low pretest probability. Mid chest pain with deep inspiration. EXAM: CT ANGIOGRAPHY CHEST WITH CONTRAST TECHNIQUE: Multidetector CT imaging of the chest was performed using the standard protocol during bolus administration of intravenous contrast. Multiplanar CT image reconstructions and MIPs were obtained to evaluate the vascular anatomy. CONTRAST:  49mL ISOVUE-370 IOPAMIDOL (ISOVUE-370) INJECTION 76% COMPARISON:  Two-view chest x-ray from the same day. FINDINGS: Cardiovascular: The heart is enlarged. The right ventricular ratio is less than 0.9. Coronary artery calcifications are present. Dense calcifications are present at the aortic arch without aneurysm or definite stenosis of the great vessel origins. Pulmonary arterial opacification is excellent. There are filling defects in the lateral posterior basilar segmental artery is of the right lower lobe. Right middle lobe and right upper lobe artery is opacified normally. There significant emboli are present on the left. Mediastinum/Nodes: No significant mediastinal or axillary adenopathy is present. Lungs/Pleura: A large right pleural effusion is present. There is partial collapse of the right lower lobe. Ground-glass attenuation in the right upper and middle lobe likely reflects atelectasis.  Mild left basilar atelectasis is present. Upper Abdomen: Dense vascular calcifications are present. The kidneys are atrophic. Musculoskeletal: Vertebral body heights alignment are normal. No focal lytic or blastic lesions are present. Review of the MIP images confirms the above findings. IMPRESSION: 1. Right lower lobe segmental pulmonary emboli. 2. Large right pleural effusion and partial collapse of the right lower lobe. 3. Cardiomegaly with ground-glass attenuation reflecting atelectasis or edema. 4.  Aortic Atherosclerosis (ICD10-I70.0). 5. Coronary artery disease. Critical Value/emergent results were called by telephone at the time of interpretation on 01/01/2017 at 7:33 pm to Dr. Charlotte Crumb , who verbally acknowledged these results. Electronically Signed   By: San Morelle M.D.   On: 01/01/2017 19:34   US Venous Img Lower Bilateral  Result Date: 01/02/2017 CLINICAL DATA:  Pulmonary embolism EXAM: BILATERAL LOWER EXTREMITY VENOUS DOPPLER ULTRASOUND TECHNIQUE: Gray-scale sonography with compression, as well as color and duplex ultrasound, were performed to evaluate the deep venous system from the level of the common femoral vein through the popliteal and proximal calf veins. Technologist describes technically limited examination secondary to patient motion and lack of cooperation. COMPARISON:  None FINDINGS: Normal compressibility of the common femoral, superficial femoral, and popliteal veins, as well as the proximal calf veins. No filling defects to suggest DVT on grayscale or color Doppler imaging. Doppler waveforms show normal direction of venous  flow, normal respiratory phasicity and response to augmentation. IMPRESSION: No evidence of  lower extremity deep vein thrombosis, bilaterally. Electronically Signed   By: Lucrezia Europe M.D.   On: 01/02/2017 13:29     Echo pending  TELEMETRY: Atrial sensing with ventricular pacing:  ASSESSMENT AND PLAN:  Active Problems:   Syncope   Pulmonary  embolism ([place])    1.  Syncope, with typical and atypical features, with paced rhythm on telemetry, in the setting of acute pulmonary embolus 2.  Chronic systolic congestive heart failure, appears euvolemic 3.  Sick sinus syndrome, status post dual-chamber pacemaker, pacing appropriately 4.  Borderline elevated troponin, likely due to demand supply ischemia secondary to pulmonary embolus  Recommendations  1.  Agree with overall current therapy 2.  Transition heparin drip to oral anticoagulant 3.  Review 2D echocardiogram  Sign off for now, please call with any questions   Isaias Cowman, MD, PhD, [facility] 01/03/2017 9:23 AM

## 2017-01-03 NOTE — Evaluation (Signed)
Physical Therapy Evaluation Patient Details Name: NYIA TSAO MRN: [id number] DOB: 1926-02-08 Today's Date: 01/03/2017   History of Present Illness  presented to ER secondary to syncopal episode after dialysis; admitted with subsegmental PE (noted on CT chest), R pleural effusion (pending thoracentesis next date).  PE initially managed with IV heparin, but discontinued 12/8 due to excessive bleeding.  Cleared for participation with session (inside 48 hour guideline) as tolerated per direct conversation with Dr. Leslye Peer.  Clinical Impression  Upon evaluation, patient alert and oriented to self only; follows simple commands, but requires frequent reorientation to situation, task at hand.  Denies symptoms of orthostasis/syncope with transition to upright; vitals stable and WFL.  Demonstrates ability to complete bed mobility with mod indep; sit/stand, basic transfers and gait (50') with RW, cga/min assist.  Limited balance reactions, functional endurance evident; recommend continued use of RW and +1 assist with all mobility Minimal SOB noted with activity; sats maintained >90% on RA throughout session. Would benefit from skilled PT to address above deficits and promote optimal return to PLOF; recommend transition to STR upon discharge from acute hospitalization.     Follow Up Recommendations SNF    Equipment Recommendations       Recommendations for Other Services       Precautions / Restrictions Precautions Precautions: Fall;ICD/Pacemaker Precaution Comments: No BP L UE Restrictions Weight Bearing Restrictions: No      Mobility  Bed Mobility Overal bed mobility: Modified Independent                Transfers Overall transfer level: Needs assistance Equipment used: Rolling walker (2 wheeled) Transfers: Sit to/from Stand Sit to Stand: Min guard;Min assist         General transfer comment: cuing for hand placement to prevent pulling on  RW  Ambulation/Gait Ambulation/Gait assistance: Min guard Ambulation Distance (Feet): 50 Feet Assistive device: Rolling walker (2 wheeled)       General Gait Details: step to gait pattern leading with L LE; mildly antalgic, but no overt bucklign or LOB.  Very broad turning radius with limited balance reactions evident.  Stairs            Wheelchair Mobility    Modified Rankin (Stroke Patients Only)       Balance Overall balance assessment: Needs assistance Sitting-balance support: No upper extremity supported;Feet supported Sitting balance-Leahy Scale: Good     Standing balance support: Bilateral upper extremity supported Standing balance-Leahy Scale: Fair                               Pertinent Vitals/Pain Pain Assessment: No/denies pain    Home Living Family/patient expects to be discharged to:: Skilled nursing facility                 Additional Comments: Resident of [facility]    Prior Function Level of Independence: Needs assistance         Comments: Assist from staff as needed for ADLs, limited mobility.  Does use RW with mobility, but endorses multiple fall history.     Hand Dominance        Extremity/Trunk Assessment   Upper Extremity Assessment Upper Extremity Assessment: Overall WFL for tasks assessed    Lower Extremity Assessment Lower Extremity Assessment: Generalized weakness(grossly 4-/5 throughout)       Communication      Cognition Arousal/Alertness: Awake/alert Behavior During Therapy: WFL for tasks assessed/performed Overall Cognitive Status: No  family/caregiver present to determine baseline cognitive functioning                                 General Comments: oriented to self only; unaware of date, time of day, general location.  Poor STM and recall of new information.       General Comments      Exercises     Assessment/Plan    PT Assessment Patient needs continued PT  services  PT Problem List Decreased strength;Decreased activity tolerance;Decreased balance;Decreased mobility;Decreased coordination;Decreased cognition;Decreased knowledge of use of DME;Decreased safety awareness;Decreased knowledge of precautions;Cardiopulmonary status limiting activity       PT Treatment Interventions DME instruction;Gait training;Functional mobility training;Therapeutic activities;Therapeutic exercise;Balance training;Cognitive remediation;Patient/family education    PT Goals (Current goals can be found in the Care Plan section)  Acute Rehab PT Goals Patient Stated Goal: to get out of bed PT Goal Formulation: With patient Time For Goal Achievement: 01/17/17 Potential to Achieve Goals: Good    Frequency Min 2X/week   Barriers to discharge Decreased caregiver support      Co-evaluation               AM-PAC PT "6 Clicks" Daily Activity  Outcome Measure Difficulty turning over in bed (including adjusting bedclothes, sheets and blankets)?: A Little Difficulty moving from lying on back to sitting on the side of the bed? : A Little Difficulty sitting down on and standing up from a chair with arms (e.g., wheelchair, bedside commode, etc,.)?: Unable Help needed moving to and from a bed to chair (including a wheelchair)?: A Little Help needed walking in hospital room?: A Little Help needed climbing 3-5 steps with a railing? : A Lot 6 Click Score: 15    End of Session Equipment Utilized During Treatment: Gait belt Activity Tolerance: Patient tolerated treatment well Patient left: in chair;with call bell/phone within reach;with chair alarm set Nurse Communication: Mobility status PT Visit Diagnosis: Unsteadiness on feet (R26.81);History of falling (Z91.81);Difficulty in walking, not elsewhere classified (R26.2)    Time: 3536-1443 PT Time Calculation (min) (ACUTE ONLY): 26 min   Charges:   PT Evaluation $PT Eval Moderate Complexity: 1 Mod PT  Treatments $Therapeutic Activity: 8-22 mins   PT G Codes:   PT G-Codes **NOT FOR INPATIENT CLASS** Functional Assessment Tool Used: AM-PAC 6 Clicks Basic Mobility Functional Limitation: Mobility: Walking and moving around Mobility: Walking and Moving Around Current Status (X5400): At least 20 percent but less than 40 percent impaired, limited or restricted Mobility: Walking and Moving Around Goal Status [phone]): At least 1 percent but less than 20 percent impaired, limited or restricted    Patryce Depriest H. Owens Shark, PT, DPT, NCS 01/03/17, 2:36 PM [phone]

## 2017-01-03 NOTE — Progress Notes (Signed)
Upon assessment patient c/o of pain with breathing, Oxygen saturations 85-87% on room air, this RN has placed patient on 2 L nasal cannula, with patient O2 saturations @ 98-100%  Pain medicine offered pt. Denies, MD notified, will await further orders and continue to monitor at this time.

## 2017-01-04 ENCOUNTER — Inpatient Hospital Stay: Payer: Medicare Other

## 2017-01-04 LAB — PHOSPHORUS: PHOSPHORUS: 4.9 mg/dL — AB (ref 2.5–4.6)

## 2017-01-04 LAB — LACTATE DEHYDROGENASE, PLEURAL OR PERITONEAL FLUID: LD, Fluid: 73 U/L — ABNORMAL HIGH (ref 3–23)

## 2017-01-04 LAB — GLUCOSE, PLEURAL OR PERITONEAL FLUID: GLUCOSE FL: 114 mg/dL

## 2017-01-04 LAB — ALBUMIN, PLEURAL OR PERITONEAL FLUID: Albumin, Fluid: 1.8 g/dL

## 2017-01-04 LAB — HEMOGLOBIN: HEMOGLOBIN: 9.4 g/dL — AB (ref 12.0–16.0)

## 2017-01-04 MED ORDER — QUETIAPINE FUMARATE 25 MG PO TABS
12.5000 mg | ORAL_TABLET | Freq: Every day | ORAL | Status: DC
  Administered 2017-01-04: 12.5 mg via ORAL
  Filled 2017-01-04: qty 1

## 2017-01-04 NOTE — Procedures (Signed)
US guided right thoracentesis.  Removed 550 ml of yellow fluid.  No blood loss and no immediate complication.

## 2017-01-04 NOTE — Progress Notes (Signed)
HD tx start 

## 2017-01-04 NOTE — Progress Notes (Signed)
HD tx end  

## 2017-01-04 NOTE — Progress Notes (Signed)
Throughout the evening when the pt coughs, she states that it hurts. When asked if would like pain medicine, the pt states "no". Will continue to monitor and assess.

## 2017-01-04 NOTE — Progress Notes (Signed)
Patient ID: Sharon [name], female   DOB: 1926-08-02, 83 y.o.   MRN: [id number]  [facility] PROGRESS NOTE  Sharon [name] EXB:[id number] DOB: [birth year] DOA: 01/01/2017 PCP: Kirk Ruths, MD  HPI/Subjective: Patient confused today. Did not sleep last night.  Objective: Vitals:   01/04/17 1034 01/04/17 1056  BP: (!) 127/58 (!) 126/49  Pulse: 61   Resp:  18  Temp:    SpO2: 100% 100%    Filed Weights   01/02/17 0500 01/03/17 0436 01/04/17 0600  Weight: 43.7 kg (96 lb 4 oz) 41.7 kg (92 lb) 44.4 kg (97 lb 14.4 oz)    ROS: Review of Systems  Unable to perform ROS: Acuity of condition  Respiratory: Positive for shortness of breath.   Cardiovascular: Positive for chest pain.   Exam: Physical Exam  Constitutional: She is oriented to person, place, and time.  HENT:  Nose: No mucosal edema.  Mouth/Throat: No oropharyngeal exudate or posterior oropharyngeal edema.  Eyes: Conjunctivae, EOM and lids are normal. Pupils are equal, round, and reactive to light.  Neck: No JVD present. Carotid bruit is not present. No edema present. No thyroid mass and no thyromegaly present.  Cardiovascular: S1 normal and S2 normal. Exam reveals no gallop.  No murmur heard. Pulses:      Dorsalis pedis pulses are 2+ on the right side, and 2+ on the left side.  Respiratory: No respiratory distress. She has decreased breath sounds in the right lower field and the left lower field. She has no wheezes. She has rhonchi in the right lower field. She has no rales.  GI: Soft. Bowel sounds are normal. There is no tenderness.  Musculoskeletal:       Right shoulder: She exhibits no swelling.       Right ankle: She exhibits no swelling.       Left ankle: She exhibits no swelling.  Lymphadenopathy:    She has no cervical adenopathy.  Neurological: She is alert and oriented to person, place, and time. No cranial nerve deficit.  Skin: Skin is warm. Nails show no clubbing.  Patient skin tear is not  actively bleeding.  Still has a little blood tinge on the packing over the dialysis site.  Psychiatric: She has a normal mood and affect.      Data Reviewed: Basic Metabolic Panel: Recent Labs  Lab 01/01/17 1725 01/02/17 0435  NA 139 139  K 3.4* 3.1*  CL 100* 100*  CO2 28 28  GLUCOSE 142* 117*  BUN 21* 31*  CREATININE 1.47* 1.96*  CALCIUM 8.8* 8.7*   CBC: Recent Labs  Lab 01/01/17 1725 01/02/17 0435 01/02/17 1415 01/03/17 0554 01/04/17 0529  WBC 5.0 5.3  --  5.9  --   NEUTROABS 3.4  --   --   --   --   HGB 10.6* 9.7* 9.9* 9.0* 9.4*  HCT 31.5* 28.6*  --  27.0*  --   MCV 93.7 93.0  --  95.0  --   PLT 214 197  --  200  --    Cardiac Enzymes: Recent Labs  Lab 01/01/17 1725 01/01/17 2134 01/02/17 0435  TROPONINI 0.10* 0.10* 0.12*     Recent Results (from the past 240 hour(s))  Culture, blood (routine x 2)     Status: None (Preliminary result)   Collection Time: 01/01/17  6:39 PM  Result Value Ref Range Status   Specimen Description BLOOD LEFT ANTECUBITAL  Final   Special Requests   Final  BOTTLES DRAWN AEROBIC AND ANAEROBIC Blood Culture adequate volume   Culture NO GROWTH 3 DAYS  Final   Report Status PENDING  Incomplete  Culture, blood (routine x 2)     Status: None (Preliminary result)   Collection Time: 01/01/17  6:48 PM  Result Value Ref Range Status   Specimen Description BLOOD LEFT HAND  Final   Special Requests   Final    BOTTLES DRAWN AEROBIC AND ANAEROBIC Blood Culture adequate volume   Culture NO GROWTH 3 DAYS  Final   Report Status PENDING  Incomplete  MRSA PCR Screening     Status: None   Collection Time: 01/01/17 10:16 PM  Result Value Ref Range Status   MRSA by PCR NEGATIVE NEGATIVE Final    Comment:        The GeneXpert MRSA Assay (FDA approved for NASAL specimens only), is one component of a comprehensive MRSA colonization surveillance program. It is not intended to diagnose MRSA infection nor to guide or monitor treatment  for MRSA infections.      Studies: Dg Chest 1 View  Result Date: 01/04/2017 CLINICAL DATA:  Post thoracentesis, history coronary disease, cardiomyopathy, former smoker artery disease post MI EXAM: CHEST 1 VIEW COMPARISON:  01/03/2017 FINDINGS: Slight decrease in RIGHT pleural effusion and basilar atelectasis post thoracentesis. No pneumothorax. Enlargement of cardiac silhouette with pulmonary vascular congestion. Minimal residual perihilar edema. Pacemaker leads project over RIGHT atrium and RIGHT ventricle unchanged. Atherosclerotic calcifications aorta. Bronchitic changes and underlying emphysematous changes again seen. IMPRESSION: No pneumothorax following RIGHT thoracentesis. Slightly decreased RIGHT pleural effusion and basilar atelectasis. Enlargement of cardiac silhouette with vascular congestion and minimal pulmonary edema. Electronically Signed   By: Lavonia Dana M.D.   On: 01/04/2017 11:35   Dg Sternum  Result Date: 01/03/2017 CLINICAL DATA:  Patient reports pain behind upper portion of sternum upon deep inspiration onset yesterday. No injury to sternum. No previous history of any injury to sternum. Hx pacemaker insetion, MI, cardiomyopathy. Former smoker. EXAM: STERNUM - 2+ VIEW COMPARISON:  Chest CT, 01/01/2017 FINDINGS: No fracture. No bone lesion. Skeletal structures are demineralized. Right pleural effusion with associated atelectasis unchanged from the previous day's CT scan. Heart is enlarged. Left lung essentially clear. IMPRESSION: No sternal fracture or bone lesion. Edema noted on the previous day's chest radiograph appears resolved. No change in the right pleural effusion or associated atelectasis. Electronically Signed   By: Lajean Manes M.D.   On: 01/03/2017 10:54   US Thoracentesis Asp Pleural Space W/img Guide  Result Date: 01/04/2017 INDICATION: 83 year old with right pleural effusion. EXAM: ULTRASOUND GUIDED RIGHT THORACENTESIS MEDICATIONS: None. COMPLICATIONS: None  immediate. PROCEDURE: An ultrasound guided thoracentesis was thoroughly discussed with the patient and questions answered. The benefits, risks, alternatives and complications were also discussed. The patient understands and wishes to proceed with the procedure. Written consent was obtained. Ultrasound was performed to localize and mark an adequate pocket of fluid in the right chest. The area was then prepped and draped in the normal sterile fashion. 1% Lidocaine was used for local anesthesia. Under ultrasound guidance a 6 Fr Safe-T-Centesis catheter was introduced. Thoracentesis was performed. The catheter was removed and a dressing applied. FINDINGS: A total of approximately 550 mL of yellow fluid was removed. Samples were sent to the laboratory as requested by the clinical team. IMPRESSION: Successful ultrasound guided right thoracentesis yielding 550 mL of pleural fluid. Electronically Signed   By: Markus Daft M.D.   On: 01/04/2017 11:29    Scheduled  Meds: . azithromycin  250 mg Oral Daily  . levothyroxine  25 mcg Oral QAC breakfast  . metoprolol succinate  25 mg Oral Daily  . ramipril  2.5 mg Oral QHS  . sertraline  100 mg Oral QHS   Continuous Infusions: . cefTRIAXone (ROCEPHIN)  IV Stopped (01/03/17 1648)    Assessment/Plan:  1. Right lower lobe pulmonary embolism with syncope.  I stopped heparin drip secondary to continued bleeding at dialysis fistula site and left arm skin tear and IV site.  Areas seem a little bit better today.  I will start low-dose Eliquis tomorrow morning. Ultrasound lower extremity negative for DVT.  Patient not a candidate for IVC filter with no DVT of the lower extremity.  If she is unable to tolerate blood thinner, secondary to bleeding than overall prognosis is poor. 2. Large right pleural effusion.  Thoracentesis done today. 3. Questionable pneumonia on CT scan on Rocephin and Zithromax. 4. End-stage renal disease on hemodialysis 5. Hypothyroidism unspecified on  levothyroxine 6. History of CAD.  Metoprolol. 7. Severe cardiomyopathy.  Add low-dose Altace at night.  On metoprolol.  Dialysis to manage fluid.  Not a candidate for Spironolactone in a dialysis patient. 8. Pain in the sternum.  Could be bone bruise from fall  9. Weakness.  Physical therapy evaluation. 10. Confusion today.  Will give a low-dose Seroquel tonight.  Code Status:     Code Status Orders  (From admission, onward)        Start     Ordered   01/01/17 2025  Full code  Continuous     01/01/17 2027    Code Status History    Date Active Date Inactive Code Status Order ID Comments User Context   05/12/2016 08:09 05/14/2016 14:39 DNR [id number]  Max Sane, MD Inpatient   05/10/2016 05:06 05/12/2016 08:09 Full Code [id number]  Saundra Shelling, MD Inpatient   12/15/2015 15:15 12/19/2015 01:59 Full Code [id number]  Oletta Cohn, DO Inpatient   12/14/2015 16:13 12/15/2015 15:15 Full Code [id number]  Bettey Costa, MD Inpatient     Family Communication: I spoke with her brother on the phone. Disposition Plan: We will need to make sure that she tolerates blood thinner prior to disposition.  Consultants:  Nephrology  Cardiology  Oncology  Antibiotics:  Rocephin  Zithromax  Time spent: 28 minutes, spoke with brother on the phone.  Nollie Terlizzi [employer]

## 2017-01-04 NOTE — Progress Notes (Signed)
[place], [place] 01/04/17  Subjective:  Patient due for hemodialysis today.  Orders have been prepared. Resting comfortably in bed.   Objective:  Vital signs in last 24 hours:  Temp:  [97.8 F (36.6 C)-98.2 F (36.8 C)] 98.2 F (36.8 C) (12/10 0818) Pulse Rate:  [57-61] 61 (12/10 1034) Resp:  [16-18] 18 (12/10 1056) BP: (122-129)/(45-62) 126/49 (12/10 1056) SpO2:  [87 %-100 %] 100 % (12/10 1056) Weight:  [44.4 kg (97 lb 14.4 oz)] 44.4 kg (97 lb 14.4 oz) (12/10 0600)  Weight change: 2.676 kg (5 lb 14.4 oz) Filed Weights   01/02/17 0500 01/03/17 0436 01/04/17 0600  Weight: 43.7 kg (96 lb 4 oz) 41.7 kg (92 lb) 44.4 kg (97 lb 14.4 oz)    Intake/Output:    Intake/Output Summary (Last 24 hours) at 01/04/2017 1253 Last data filed at 01/03/2017 1859 Gross per 24 hour  Intake 480 ml  Output 300 ml  Net 180 ml     Physical Exam: General:  Frail, elderly, thin, laying in the bed  HEENT  anicteric, moist oral mucous membranes, decreased hearing  Neck  supple  Pulm/lungs  decreased breath sounds at bases, no crackles  CVS/Heart  irregular rhythm  Abdomen:   Soft, nontender  Extremities:  No pitting edema  Neurologic:  Alert, able to follow commands  Skin:  Multiple skin tears, some bleeding noted, bilateral upper extremity ecchymoses  Access:  Right arm AV fistula       Basic Metabolic Panel:  Recent Labs  Lab 01/01/17 1725 01/02/17 0435  NA 139 139  K 3.4* 3.1*  CL 100* 100*  CO2 28 28  GLUCOSE 142* 117*  BUN 21* 31*  CREATININE 1.47* 1.96*  CALCIUM 8.8* 8.7*     CBC: Recent Labs  Lab 01/01/17 1725 01/02/17 0435 01/02/17 1415 01/03/17 0554 01/04/17 0529  WBC 5.0 5.3  --  5.9  --   NEUTROABS 3.4  --   --   --   --   HGB 10.6* 9.7* 9.9* 9.0* 9.4*  HCT 31.5* 28.6*  --  27.0*  --   MCV 93.7 93.0  --  95.0  --   PLT 214 197  --  200  --       Lab Results  Component Value Date   HEPBSAG Negative 05/13/2016       Microbiology:  Recent Results (from the past 240 hour(s))  Culture, blood (routine x 2)     Status: None (Preliminary result)   Collection Time: 01/01/17  6:39 PM  Result Value Ref Range Status   Specimen Description BLOOD LEFT ANTECUBITAL  Final   Special Requests   Final    BOTTLES DRAWN AEROBIC AND ANAEROBIC Blood Culture adequate volume   Culture NO GROWTH 3 DAYS  Final   Report Status PENDING  Incomplete  Culture, blood (routine x 2)     Status: None (Preliminary result)   Collection Time: 01/01/17  6:48 PM  Result Value Ref Range Status   Specimen Description BLOOD LEFT HAND  Final   Special Requests   Final    BOTTLES DRAWN AEROBIC AND ANAEROBIC Blood Culture adequate volume   Culture NO GROWTH 3 DAYS  Final   Report Status PENDING  Incomplete  MRSA PCR Screening     Status: None   Collection Time: 01/01/17 10:16 PM  Result Value Ref Range Status   MRSA by PCR NEGATIVE NEGATIVE Final    Comment:  The GeneXpert MRSA Assay (FDA approved for NASAL specimens only), is one component of a comprehensive MRSA colonization surveillance program. It is not intended to diagnose MRSA infection nor to guide or monitor treatment for MRSA infections.     Coagulation Studies: Recent Labs    01/01/17 24-May-2130  LABPROT 16.9*  INR 1.39    Urinalysis: No results for input(s): COLORURINE, LABSPEC, PHURINE, GLUCOSEU, HGBUR, BILIRUBINUR, KETONESUR, PROTEINUR, UROBILINOGEN, NITRITE, LEUKOCYTESUR in the last 72 hours.  Invalid input(s): APPERANCEUR    Imaging: Dg Chest 1 View  Result Date: 01/04/2017 CLINICAL DATA:  Post thoracentesis, history coronary disease, cardiomyopathy, former smoker artery disease post MI EXAM: CHEST 1 VIEW COMPARISON:  01/03/2017 FINDINGS: Slight decrease in RIGHT pleural effusion and basilar atelectasis post thoracentesis. No pneumothorax. Enlargement of cardiac silhouette with pulmonary vascular congestion. Minimal residual perihilar edema.  Pacemaker leads project over RIGHT atrium and RIGHT ventricle unchanged. Atherosclerotic calcifications aorta. Bronchitic changes and underlying emphysematous changes again seen. IMPRESSION: No pneumothorax following RIGHT thoracentesis. Slightly decreased RIGHT pleural effusion and basilar atelectasis. Enlargement of cardiac silhouette with vascular congestion and minimal pulmonary edema. Electronically Signed   By: Lavonia Dana M.D.   On: 01/04/2017 11:35   Dg Sternum  Result Date: 01/03/2017 CLINICAL DATA:  Patient reports pain behind upper portion of sternum upon deep inspiration onset yesterday. No injury to sternum. No previous history of any injury to sternum. Hx pacemaker insetion, MI, cardiomyopathy. Former smoker. EXAM: STERNUM - 2+ VIEW COMPARISON:  Chest CT, 01/01/2017 FINDINGS: No fracture. No bone lesion. Skeletal structures are demineralized. Right pleural effusion with associated atelectasis unchanged from the previous day's CT scan. Heart is enlarged. Left lung essentially clear. IMPRESSION: No sternal fracture or bone lesion. Edema noted on the previous day's chest radiograph appears resolved. No change in the right pleural effusion or associated atelectasis. Electronically Signed   By: Lajean Manes M.D.   On: 01/03/2017 10:54   US Thoracentesis Asp Pleural Space W/img Guide  Result Date: 01/04/2017 INDICATION: 83 year old with right pleural effusion. EXAM: ULTRASOUND GUIDED RIGHT THORACENTESIS MEDICATIONS: None. COMPLICATIONS: None immediate. PROCEDURE: An ultrasound guided thoracentesis was thoroughly discussed with the patient and questions answered. The benefits, risks, alternatives and complications were also discussed. The patient understands and wishes to proceed with the procedure. Written consent was obtained. Ultrasound was performed to localize and mark an adequate pocket of fluid in the right chest. The area was then prepped and draped in the normal sterile fashion. 1%  Lidocaine was used for local anesthesia. Under ultrasound guidance a 6 Fr Safe-T-Centesis catheter was introduced. Thoracentesis was performed. The catheter was removed and a dressing applied. FINDINGS: A total of approximately 550 mL of yellow fluid was removed. Samples were sent to the laboratory as requested by the clinical team. IMPRESSION: Successful ultrasound guided right thoracentesis yielding 550 mL of pleural fluid. Electronically Signed   By: Markus Daft M.D.   On: 01/04/2017 11:29     Medications:   . cefTRIAXone (ROCEPHIN)  IV Stopped (01/03/17 1648)   . azithromycin  250 mg Oral Daily  . levothyroxine  25 mcg Oral QAC breakfast  . metoprolol succinate  25 mg Oral Daily  . ramipril  2.5 mg Oral QHS  . sertraline  100 mg Oral QHS   acetaminophen **OR** acetaminophen, albuterol, metoCLOPramide, ondansetron **OR** ondansetron (ZOFRAN) IV, polyethylene glycol, traMADol  Assessment/ Plan:  83 y.o. Caucasian female with end-stage renal disease, left femur fracture, anemia, hypertension, coronary disease, atherosclerosis, cardiomyopathy, history of peritoneal dialysis  presents for evaluation of fall after syncope  [facility]/MWF/[place] [facility]/195 minutes / 43.5 kg  1.  End-stage renal disease  2.  Anemia of chronic kidney disease 3.  Secondary hyperparathyroidism 4.  Right lower lobe subsegmental pulmonary embolus 5.  Syncope 6.  Hypokalemia  Patient due for hemodialysis later today.  Orders have been prepared.  Ultrafiltration target 1-1.5 kg.  Thoracentesis has been performed and 550 cc of fluid was removed.  She would likely be started back on some type of anticoagulation after dialysis today.  We will need to monitor for bleeding from the patient's axis thereafter.   LOS: 2 Sharon [name] 12/10/201812:53 PM  [facility] [place], [place]

## 2017-01-04 NOTE — Plan of Care (Signed)
Pt to have thoracentesis this am

## 2017-01-04 NOTE — Progress Notes (Signed)
Post HD assessment  

## 2017-01-04 NOTE — Progress Notes (Signed)
No charge note:   Palliative consult received.   Chart reviewed. Attempted to call patient's brother to schedule [place] meeting. No answer to phone number listed. PMT will continue to try and contact.  Mariana Kaufman, AGNP-C Palliative Medicine  Please call Palliative Medicine team phone with any questions [phone]. For individual providers please see AMION.

## 2017-01-04 NOTE — Progress Notes (Signed)
Pre HD assessment  

## 2017-01-04 NOTE — Plan of Care (Signed)
Easy bruising

## 2017-01-05 MED ORDER — APIXABAN 2.5 MG PO TABS
2.5000 mg | ORAL_TABLET | Freq: Two times a day (BID) | ORAL | Status: DC
  Administered 2017-01-05 – 2017-01-06 (×3): 2.5 mg via ORAL
  Filled 2017-01-05 (×3): qty 1

## 2017-01-05 MED ORDER — NEPRO/CARBSTEADY PO LIQD
237.0000 mL | Freq: Two times a day (BID) | ORAL | Status: DC
Start: 2017-01-05 — End: 2017-01-08
  Administered 2017-01-05 – 2017-01-06 (×4): 237 mL via ORAL

## 2017-01-05 MED ORDER — VITAMIN C 500 MG PO TABS
500.0000 mg | ORAL_TABLET | Freq: Two times a day (BID) | ORAL | Status: DC
  Administered 2017-01-05 – 2017-01-08 (×6): 500 mg via ORAL
  Filled 2017-01-05 (×8): qty 1

## 2017-01-05 MED ORDER — RENA-VITE PO TABS
1.0000 | ORAL_TABLET | Freq: Every day | ORAL | Status: DC
  Administered 2017-01-05 – 2017-01-07 (×3): 1 via ORAL
  Filled 2017-01-05 (×4): qty 1

## 2017-01-05 MED ORDER — ADULT MULTIVITAMIN W/MINERALS CH: 1.0000 | ORAL_TABLET | ORAL | Status: DC

## 2017-01-05 MED ORDER — EPOETIN ALFA 10000 UNIT/ML IJ SOLN
10000.0000 [IU] | INTRAMUSCULAR | Status: DC
  Administered 2017-01-06 – 2017-01-08 (×2): 10000 [IU] via INTRAVENOUS
  Filled 2017-01-05: qty 1

## 2017-01-05 NOTE — Progress Notes (Signed)
Initial Nutrition Assessment  DOCUMENTATION CODES:   Severe malnutrition in context of chronic illness  INTERVENTION:   Nepro Shake po BID, each supplement provides 425 kcal and 19 grams protein  Renal multivitamin daily  MVI twice weekly   Vitamin C 500mg  po BID  Liberalize diet  NUTRITION DIAGNOSIS:   Severe Malnutrition related to catabolic illness(ESRD on HD) as evidenced by severe fat depletion, severe muscle depletion.  GOAL:   Patient will meet greater than or equal to 90% of their needs  MONITOR:   PO intake, Supplement acceptance, Labs, Weight trends, I & O's  REASON FOR ASSESSMENT:   Other (Comment)(low BMI)    ASSESSMENT:   83 y.o. Caucasian female with end-stage renal disease, left femur fracture, anemia, hypertension, coronary disease, atherosclerosis, cardiomyopathy, history of peritoneal dialysis presents for evaluation of fall after syncope   Visited pt's room today. Unable to obtain nutrition related history as pt with confusion. Pt appears severely malnourished with widespread bruising over entire body. RD highly suspects this pt with Scurvy r/t chronic HD, poor appetite and oral intake, and non healing wounds. Recommend vitamin C 500 mg po BID. Per chart, pt appears to be weight stable. Pt ate 75% of her lunch today. RD will order supplements and renal multivitamin.    Medications reviewed and include: synthroid, ceftriaxone   Labs reviewed: K 3.1(L), Cl 100(L), BUN31(H), creat 1.96(H), Ca 8.7(L)- 12/8 P 4.9(H) Hgb 9.4(L), Hct 27(L)  Nutrition-Focused physical exam completed. Findings are severe fat and muscle depletions over entire body, and mild generalized edema.   Diet Order:  Diet regular Room service appropriate? Yes; Fluid consistency: Thin  EDUCATION NEEDS:   Not appropriate for education at this time  Skin:  Skin Assessment: (Stage I coccyx)  Last BM:  12/11- TYPE 7  Height:   Ht Readings from Last 1 Encounters:  01/01/17 5\' 4"   (1.626 m)    Weight:   Wt Readings from Last 1 Encounters:  01/05/17 93 lb 3.2 oz (42.3 kg)    Ideal Body Weight:  54.5 kg  BMI:  Body mass index is 16 kg/m.  Estimated Nutritional Needs:   Kcal:  1300-1500kcal/day   Protein:  76-84g/day   Fluid:  >1.3L/day   Koleen Distance MS, RD, LDN Pager #[phone] After Hours Pager: [phone]

## 2017-01-05 NOTE — Progress Notes (Signed)
No charge note.   Spoke with patient's brother Ed. Scheduled meeting tomorrow 12/12 at 1pm for [place].   Mariana Kaufman, AGNP-C Palliative Medicine  Please call Palliative Medicine team phone with any questions [phone]. For individual providers please see AMION.

## 2017-01-05 NOTE — Care Management (Signed)
patient presented to [facility] from [facility].  She is chronic HD and Elvera Bicker with [facility] notified. CSW consult for long term placement.

## 2017-01-05 NOTE — Progress Notes (Addendum)
Patient ID: Sharon [name], female   DOB: 10/19/1926, 83 y.o.   MRN: [id number]  [facility] PROGRESS NOTE  JIMMA ORTMAN MWN:[id number] DOB: 09/14/1926 DOA: 01/01/2017 PCP: Kirk Ruths, MD  HPI/Subjective: Patient feels okay.  States she is a little groggy this morning.  Slept okay last night.  States her breathing is better.  Objective: Vitals:   01/05/17 0431 01/05/17 0948  BP: (!) 113/40 (!) 119/46  Pulse: 95 60  Resp: 16 14  Temp: 97.8 F (36.6 C)   SpO2: 97% 95%    Filed Weights   01/04/17 0600 01/05/17 0500  Weight: 44.4 kg (97 lb 14.4 oz) 42.3 kg (93 lb 3.2 oz)    ROS: Review of Systems  Constitutional: Negative for chills and fever.  Eyes: Negative for blurred vision.  Respiratory: Negative for cough and shortness of breath.   Cardiovascular: Positive for chest pain.  Gastrointestinal: Negative for abdominal pain, constipation, diarrhea, nausea and vomiting.  Genitourinary: Negative for dysuria.  Musculoskeletal: Negative for joint pain.  Neurological: Negative for dizziness and headaches.   Exam: Physical Exam  Constitutional: She is oriented to person, place, and time.  HENT:  Nose: No mucosal edema.  Mouth/Throat: No oropharyngeal exudate or posterior oropharyngeal edema.  Eyes: Conjunctivae, EOM and lids are normal. Pupils are equal, round, and reactive to light.  Neck: No JVD present. Carotid bruit is not present. No edema present. No thyroid mass and no thyromegaly present.  Cardiovascular: S1 normal and S2 normal. Exam reveals no gallop.  No murmur heard. Pulses:      Dorsalis pedis pulses are 2+ on the right side, and 2+ on the left side.  Respiratory: No respiratory distress. She has decreased breath sounds in the right lower field and the left lower field. She has no wheezes. She has rhonchi in the right lower field. She has no rales.  GI: Soft. Bowel sounds are normal. There is no tenderness.  Musculoskeletal:       Right shoulder:  She exhibits no swelling.       Right ankle: She exhibits no swelling.       Left ankle: She exhibits no swelling.  Lymphadenopathy:    She has no cervical adenopathy.  Neurological: She is alert and oriented to person, place, and time. No cranial nerve deficit.  Skin: Skin is warm. Nails show no clubbing.  Patient skin tear is not actively bleeding.  Still has a little blood tinge on the packing over the dialysis site.  Psychiatric: She has a normal mood and affect.      Data Reviewed: Basic Metabolic Panel: Recent Labs  Lab 01/01/17 1725 01/02/17 0435 01/04/17 1722  NA 139 139  --   K 3.4* 3.1*  --   CL 100* 100*  --   CO2 28 28  --   GLUCOSE 142* 117*  --   BUN 21* 31*  --   CREATININE 1.47* 1.96*  --   CALCIUM 8.8* 8.7*  --   PHOS  --   --  4.9*   CBC: Recent Labs  Lab 01/01/17 1725 01/02/17 0435 01/02/17 1415 01/03/17 0554 01/04/17 0529  WBC 5.0 5.3  --  5.9  --   NEUTROABS 3.4  --   --   --   --   HGB 10.6* 9.7* 9.9* 9.0* 9.4*  HCT 31.5* 28.6*  --  27.0*  --   MCV 93.7 93.0  --  95.0  --   PLT 214 197  --  200  --    Cardiac Enzymes: Recent Labs  Lab 01/01/17 1725 01/01/17 2134 01/02/17 0435  TROPONINI 0.10* 0.10* 0.12*     Recent Results (from the past 240 hour(s))  Culture, blood (routine x 2)     Status: None (Preliminary result)   Collection Time: 01/01/17  6:39 PM  Result Value Ref Range Status   Specimen Description BLOOD LEFT ANTECUBITAL  Final   Special Requests   Final    BOTTLES DRAWN AEROBIC AND ANAEROBIC Blood Culture adequate volume   Culture NO GROWTH 4 DAYS  Final   Report Status PENDING  Incomplete  Culture, blood (routine x 2)     Status: None (Preliminary result)   Collection Time: 01/01/17  6:48 PM  Result Value Ref Range Status   Specimen Description BLOOD LEFT HAND  Final   Special Requests   Final    BOTTLES DRAWN AEROBIC AND ANAEROBIC Blood Culture adequate volume   Culture NO GROWTH 4 DAYS  Final   Report Status  PENDING  Incomplete  MRSA PCR Screening     Status: None   Collection Time: 01/01/17 10:16 PM  Result Value Ref Range Status   MRSA by PCR NEGATIVE NEGATIVE Final    Comment:        The GeneXpert MRSA Assay (FDA approved for NASAL specimens only), is one component of a comprehensive MRSA colonization surveillance program. It is not intended to diagnose MRSA infection nor to guide or monitor treatment for MRSA infections.      Studies: Dg Chest 1 View  Result Date: 01/04/2017 CLINICAL DATA:  Post thoracentesis, history coronary disease, cardiomyopathy, former smoker artery disease post MI EXAM: CHEST 1 VIEW COMPARISON:  01/03/2017 FINDINGS: Slight decrease in RIGHT pleural effusion and basilar atelectasis post thoracentesis. No pneumothorax. Enlargement of cardiac silhouette with pulmonary vascular congestion. Minimal residual perihilar edema. Pacemaker leads project over RIGHT atrium and RIGHT ventricle unchanged. Atherosclerotic calcifications aorta. Bronchitic changes and underlying emphysematous changes again seen. IMPRESSION: No pneumothorax following RIGHT thoracentesis. Slightly decreased RIGHT pleural effusion and basilar atelectasis. Enlargement of cardiac silhouette with vascular congestion and minimal pulmonary edema. Electronically Signed   By: Lavonia Dana M.D.   On: 01/04/2017 11:35   US Thoracentesis Asp Pleural Space W/img Guide  Result Date: 01/04/2017 INDICATION: 83 year old with right pleural effusion. EXAM: ULTRASOUND GUIDED RIGHT THORACENTESIS MEDICATIONS: None. COMPLICATIONS: None immediate. PROCEDURE: An ultrasound guided thoracentesis was thoroughly discussed with the patient and questions answered. The benefits, risks, alternatives and complications were also discussed. The patient understands and wishes to proceed with the procedure. Written consent was obtained. Ultrasound was performed to localize and mark an adequate pocket of fluid in the right chest. The area  was then prepped and draped in the normal sterile fashion. 1% Lidocaine was used for local anesthesia. Under ultrasound guidance a 6 Fr Safe-T-Centesis catheter was introduced. Thoracentesis was performed. The catheter was removed and a dressing applied. FINDINGS: A total of approximately 550 mL of yellow fluid was removed. Samples were sent to the laboratory as requested by the clinical team. IMPRESSION: Successful ultrasound guided right thoracentesis yielding 550 mL of pleural fluid. Electronically Signed   By: Markus Daft M.D.   On: 01/04/2017 11:29    Scheduled Meds: . apixaban  2.5 mg Oral BID  . [START ON 01/06/2017] epoetin (EPOGEN/PROCRIT) injection  10,000 Units Intravenous Q M,W,F-HD  . feeding supplement (NEPRO CARB STEADY)  237 mL Oral BID BM  . levothyroxine  25 mcg Oral QAC  breakfast  . metoprolol succinate  25 mg Oral Daily  . multivitamin  1 tablet Oral QHS  . [START ON 01/07/2017] multivitamin with minerals  1 tablet Oral Once per day on Mon Thu  . ramipril  2.5 mg Oral QHS  . sertraline  100 mg Oral QHS  . vitamin C  500 mg Oral BID   Continuous Infusions: . cefTRIAXone (ROCEPHIN)  IV Stopped (01/03/17 1648)    Assessment/Plan:  1. Right lower lobe pulmonary embolism with syncope.  Started low-dose Eliquis and see if she bleeds. 2. Large right pleural effusion.  Thoracentesis done yesterday. 3. Questionable pneumonia on CT scan on Rocephin and finished Zithromax. 4. End-stage renal disease on hemodialysis.  Will have dialysis tomorrow morning. 5. Hypothyroidism unspecified on levothyroxine 6. History of CAD.  Metoprolol. 7. Severe cardiomyopathy.  Add low-dose Altace at night.  On metoprolol.  Dialysis to manage fluid.  Not a candidate for Spironolactone in a dialysis patient. 8. Pain in the sternum.  Could be bone bruise from fall  9. Weakness.  Physical therapy evaluation. 10. Confusion today.  Will give a low-dose Seroquel tonight.  Code Status:     Code Status  Orders  (From admission, onward)        Start     Ordered   01/01/17 2025  Full code  Continuous     01/01/17 2027    Code Status History    Date Active Date Inactive Code Status Order ID Comments User Context   05/12/2016 08:09 05/14/2016 14:39 DNR [id number]  Max Sane, MD Inpatient   05/10/2016 05:06 05/12/2016 08:09 Full Code [id number]  Saundra Shelling, MD Inpatient   12/15/2015 15:15 12/19/2015 01:59 Full Code [id number]  Oletta Cohn, DO Inpatient   12/14/2015 16:13 12/15/2015 15:15 Full Code [id number]  Bettey Costa, MD Inpatient     Family Communication: I spoke with her daughter in law on the phone. Disposition Plan: We will need to make sure that she tolerates blood thinner prior to disposition.  Consultants:  Nephrology  Cardiology  Oncology  Antibiotics:  Rocephin  Zithromax finished  Time spent: 28 minutes, potential discharge tomorrow after dialysis  [employer]

## 2017-01-05 NOTE — Plan of Care (Signed)
  Progressing Clinical Measurements: Respiratory complications will improve 01/05/2017 0512 - Progressing by Jeri Cos, RN

## 2017-01-05 NOTE — Clinical Social Work Note (Signed)
Clinical Social Work Assessment  Patient Details  [name] MRN: [id number] Date of Birth: July 21, 1926  Date of referral:  01/05/17               Reason for consult:  Facility Placement                Permission sought to share information with:  Family Supports, Customer service manager Permission granted to share information::  Yes, Verbal Permission Granted  Name::     Riddle,Edward Brother [phone] or Dunn,Tracy Niece [phone]   Agency::  SNF admissions  Relationship::     Contact Information:     Housing/Transportation Living arrangements for the past 2 months:  [place], [place] of Information:  Patient, Medical Team Patient Interpreter Needed:  None Criminal Activity/Legal Involvement Pertinent to Current Situation/Hospitalization:  No - Comment as needed Significant Relationships:  Other Family Members Lives with:  Self Do you feel safe going back to the place where you live?  No Need for family participation in patient care:  No (Coment)  Care giving concerns:  Patient states she is at [facility] and plans to return back to SNF.   Social Worker assessment / plan:  Patient is an 83 year old female who is alert and oriented x4.  Patient states she has been at [facility] for a few weeks, and plans to return back to SNF.  Patient states she is pleased with the care that is being provided.  Patient expressed frustration that she is not able to walk properly, but is motivated to work with therapy to work on trying to walk again.  Patient states she is looking forward to discharging from hospital so she can continue with her therapy.  Patient did not express any other questions or concerns and gave CSW permission to send updated clinicals to [facility].  Employment status:  Retired Forensic scientist:  Medicare PT Recommendations:  [place] / Referral to community  resources:  [place]  Patient/Family's Response to care:  Patient is in agreement to returning back to SNF.  Patient/Family's Understanding of and Emotional Response to Diagnosis, Current Treatment, and Prognosis:  Patient is hopeful that she can continue with her therapy so she can return back home.  Emotional Assessment Appearance:  Appears stated age Attitude/Demeanor/Rapport:    Affect (typically observed):  Calm, Appropriate Orientation:  Oriented to Place, Oriented to Self, Oriented to  Time, Oriented to Situation Alcohol / Substance use:  Not Applicable Psych involvement (Current and /or in the community):  No (Comment)  Discharge Needs  Concerns to be addressed:  Care Coordination Readmission within the last 30 days:  No Current discharge risk:  None Barriers to Discharge:  Continued Medical Work up   Anell Barr 01/05/2017, 5:55 PM

## 2017-01-05 NOTE — Progress Notes (Signed)
Physical Therapy Treatment Patient Details [name] MRN: [id number] DOB: 1927/01/19 Today's Date: 01/05/2017    History of Present Illness presented to ER secondary to syncopal episode after dialysis; admitted with subsegmental PE (noted on CT chest), R pleural effusion (pending thoracentesis next date).  PE initially managed with IV heparin, but discontinued 12/8 due to excessive bleeding.  Cleared for participation with session (inside 48 hour guideline) as tolerated per direct conversation with Dr. Leslye Peer.    PT Comments    Pt sitting edge of bed upon arrival.  Generally lethargic and tangled in blankets.  She required frequent verbal and tactile cues to obtain standing with min assist.  She ambulated 55' then an additional 25' with walker and min assist.  She did require some increased assist today with gait and transfers.  After discussion with Dr. Earleen Newport, it is felt it is medication related and she should clear later today.  She was noted to have some confusion as she thought she was in a kitchen upon exiting room but once orientated she knew she was in the hospital.  She is aware of lethargy and confusion.  SNF remains appropriate at this time upon discharge.   Follow Up Recommendations  SNF     Equipment Recommendations       Recommendations for Other Services       Precautions / Restrictions Precautions Precautions: Fall;ICD/Pacemaker Restrictions Weight Bearing Restrictions: No    Mobility  Bed Mobility Overal bed mobility: Modified Independent             General bed mobility comments: Sitting edge of bed upon arrival  Transfers Overall transfer level: Needs assistance Equipment used: Rolling walker (2 wheeled) Transfers: Sit to/from Stand Sit to Stand: Min assist            Ambulation/Gait Ambulation/Gait assistance: Min assist Ambulation Distance (Feet): 50 Feet Assistive device: Rolling walker (2 wheeled)     Gait velocity  interpretation: <1.8 ft/sec, indicative of risk for recurrent falls General Gait Details: 78' 30' with walker.  Pt lethargic today and required increased asssit for gait.  Generally weak and unsafe to ambulate without assist.   Stairs            Wheelchair Mobility    Modified Rankin (Stroke Patients Only)       Balance Overall balance assessment: Needs assistance Sitting-balance support: No upper extremity supported;Feet supported Sitting balance-Leahy Scale: Fair     Standing balance support: Bilateral upper extremity supported Standing balance-Leahy Scale: Poor                              Cognition Arousal/Alertness: Lethargic Behavior During Therapy: WFL for tasks assessed/performed Overall Cognitive Status: No family/caregiver present to determine baseline cognitive functioning                                        Exercises      General Comments        Pertinent Vitals/Pain Pain Assessment: No/denies pain    Home Living                      Prior Function            PT Goals (current goals can now be found in the care plan section) Progress towards PT goals: Progressing toward  goals    Frequency    Min 2X/week      PT Plan Current plan remains appropriate    Co-evaluation              AM-PAC PT "6 Clicks" Daily Activity  Outcome Measure  Difficulty turning over in bed (including adjusting bedclothes, sheets and blankets)?: A Little Difficulty moving from lying on back to sitting on the side of the bed? : A Little Difficulty sitting down on and standing up from a chair with arms (e.g., wheelchair, bedside commode, etc,.)?: Unable Help needed moving to and from a bed to chair (including a wheelchair)?: A Little Help needed walking in hospital room?: A Little Help needed climbing 3-5 steps with a railing? : A Lot 6 Click Score: 15    End of Session Equipment Utilized During Treatment: Gait  belt Activity Tolerance: Patient tolerated treatment well Patient left: in chair;with call bell/phone within reach;with chair alarm set         Time: 4461-9012 PT Time Calculation (min) (ACUTE ONLY): 23 min  Charges:  $Gait Training: 8-22 mins $Therapeutic Activity: 8-22 mins                    G Codes:       Chesley Noon, PTA 01/05/17, 9:43 AM

## 2017-01-05 NOTE — Progress Notes (Signed)
[place], [place] 01/05/17  Subjective:  Patient seen at bedside. Resting comfortably at the moment. She did complete hemodialysis yesterday. She will be due for dialysis again tomorrow.   Objective:  Vital signs in last 24 hours:  Temp:  [97.6 F (36.4 C)-97.9 F (36.6 C)] 97.8 F (36.6 C) (12/11 0431) Pulse Rate:  [60-95] 60 (12/11 0948) Resp:  [13-20] 14 (12/11 0948) BP: (113-128)/(40-56) 119/46 (12/11 0948) SpO2:  [92 %-99 %] 95 % (12/11 0948) Weight:  [42.3 kg (93 lb 3.2 oz)] 42.3 kg (93 lb 3.2 oz) (12/11 0500)  Weight change: -2.132 kg (-11.2 oz) Filed Weights   01/04/17 0600 01/05/17 0500  Weight: 44.4 kg (97 lb 14.4 oz) 42.3 kg (93 lb 3.2 oz)    Intake/Output:    Intake/Output Summary (Last 24 hours) at 01/05/2017 1447 Last data filed at 01/04/2017 2051 Gross per 24 hour  Intake -  Output 1523 ml  Net -1523 ml     Physical Exam: General:  Frail, elderly, thin, laying in the bed  HEENT  anicteric, moist oral mucous membranes, decreased hearing  Neck  supple  Pulm/lungs  decreased breath sounds at bases, no crackles  CVS/Heart  irregular rhythm  Abdomen:   Soft, nontender  Extremities:  No pitting edema  Neurologic:  Alert, able to follow commands  Skin:  B/L upper extremity ecchymoses  Access:  Right arm AV fistula       Basic Metabolic Panel:  Recent Labs  Lab 01/01/17 1725 01/02/17 0435 01/04/17 1722  NA 139 139  --   K 3.4* 3.1*  --   CL 100* 100*  --   CO2 28 28  --   GLUCOSE 142* 117*  --   BUN 21* 31*  --   CREATININE 1.47* 1.96*  --   CALCIUM 8.8* 8.7*  --   PHOS  --   --  4.9*     CBC: Recent Labs  Lab 01/01/17 1725 01/02/17 0435 01/02/17 1415 01/03/17 0554 01/04/17 0529  WBC 5.0 5.3  --  5.9  --   NEUTROABS 3.4  --   --   --   --   HGB 10.6* 9.7* 9.9* 9.0* 9.4*  HCT 31.5* 28.6*  --  27.0*  --   MCV 93.7 93.0  --  95.0  --   PLT 214 197  --  200  --       Lab Results  Component  Value Date   HEPBSAG Negative 05/13/2016      Microbiology:  Recent Results (from the past 240 hour(s))  Culture, blood (routine x 2)     Status: None (Preliminary result)   Collection Time: 01/01/17  6:39 PM  Result Value Ref Range Status   Specimen Description BLOOD LEFT ANTECUBITAL  Final   Special Requests   Final    BOTTLES DRAWN AEROBIC AND ANAEROBIC Blood Culture adequate volume   Culture NO GROWTH 4 DAYS  Final   Report Status PENDING  Incomplete  Culture, blood (routine x 2)     Status: None (Preliminary result)   Collection Time: 01/01/17  6:48 PM  Result Value Ref Range Status   Specimen Description BLOOD LEFT HAND  Final   Special Requests   Final    BOTTLES DRAWN AEROBIC AND ANAEROBIC Blood Culture adequate volume   Culture NO GROWTH 4 DAYS  Final   Report Status PENDING  Incomplete  MRSA PCR Screening     Status: None  Collection Time: 01/01/17 10:16 PM  Result Value Ref Range Status   MRSA by PCR NEGATIVE NEGATIVE Final    Comment:        The GeneXpert MRSA Assay (FDA approved for NASAL specimens only), is one component of a comprehensive MRSA colonization surveillance program. It is not intended to diagnose MRSA infection nor to guide or monitor treatment for MRSA infections.     Coagulation Studies: No results for input(s): LABPROT, INR in the last 72 hours.  Urinalysis: No results for input(s): COLORURINE, LABSPEC, PHURINE, GLUCOSEU, HGBUR, BILIRUBINUR, KETONESUR, PROTEINUR, UROBILINOGEN, NITRITE, LEUKOCYTESUR in the last 72 hours.  Invalid input(s): APPERANCEUR    Imaging: Dg Chest 1 View  Result Date: 01/04/2017 CLINICAL DATA:  Post thoracentesis, history coronary disease, cardiomyopathy, former smoker artery disease post MI EXAM: CHEST 1 VIEW COMPARISON:  01/03/2017 FINDINGS: Slight decrease in RIGHT pleural effusion and basilar atelectasis post thoracentesis. No pneumothorax. Enlargement of cardiac silhouette with pulmonary vascular  congestion. Minimal residual perihilar edema. Pacemaker leads project over RIGHT atrium and RIGHT ventricle unchanged. Atherosclerotic calcifications aorta. Bronchitic changes and underlying emphysematous changes again seen. IMPRESSION: No pneumothorax following RIGHT thoracentesis. Slightly decreased RIGHT pleural effusion and basilar atelectasis. Enlargement of cardiac silhouette with vascular congestion and minimal pulmonary edema. Electronically Signed   By: Lavonia Dana M.D.   On: 01/04/2017 11:35   US Thoracentesis Asp Pleural Space W/img Guide  Result Date: 01/04/2017 INDICATION: 83 year old with right pleural effusion. EXAM: ULTRASOUND GUIDED RIGHT THORACENTESIS MEDICATIONS: None. COMPLICATIONS: None immediate. PROCEDURE: An ultrasound guided thoracentesis was thoroughly discussed with the patient and questions answered. The benefits, risks, alternatives and complications were also discussed. The patient understands and wishes to proceed with the procedure. Written consent was obtained. Ultrasound was performed to localize and mark an adequate pocket of fluid in the right chest. The area was then prepped and draped in the normal sterile fashion. 1% Lidocaine was used for local anesthesia. Under ultrasound guidance a 6 Fr Safe-T-Centesis catheter was introduced. Thoracentesis was performed. The catheter was removed and a dressing applied. FINDINGS: A total of approximately 550 mL of yellow fluid was removed. Samples were sent to the laboratory as requested by the clinical team. IMPRESSION: Successful ultrasound guided right thoracentesis yielding 550 mL of pleural fluid. Electronically Signed   By: Markus Daft M.D.   On: 01/04/2017 11:29     Medications:   . cefTRIAXone (ROCEPHIN)  IV Stopped (01/03/17 1648)   . apixaban  2.5 mg Oral BID  . feeding supplement (NEPRO CARB STEADY)  237 mL Oral BID BM  . levothyroxine  25 mcg Oral QAC breakfast  . metoprolol succinate  25 mg Oral Daily  .  multivitamin  1 tablet Oral QHS  . [START ON 01/07/2017] multivitamin with minerals  1 tablet Oral Once per day on Mon Thu  . ramipril  2.5 mg Oral QHS  . sertraline  100 mg Oral QHS  . vitamin C  500 mg Oral BID   acetaminophen **OR** acetaminophen, albuterol, ondansetron **OR** ondansetron (ZOFRAN) IV, polyethylene glycol, traMADol  Assessment/ Plan:  83 y.o. Caucasian female with end-stage renal disease, left femur fracture, anemia, hypertension, coronary disease, atherosclerosis, cardiomyopathy, history of peritoneal dialysis presents for evaluation of fall after syncope  [facility]/MWF/[place] [facility]/195 minutes / 43.5 kg  1.  End-stage renal disease  2.  Anemia of chronic kidney disease 3.  Secondary hyperparathyroidism 4.  Right lower lobe subsegmental pulmonary embolus 5.  Syncope 6.  Hypokalemia  Plan:  Patient  underwent hemodialysis yesterday.  No acute indication for dialysis today. We'll plan for dialysis again tomorrow.  She was started on anticoagulation today.  We will need to monitor for excessive bleeding from her dialysis access tomorrow.  We will start the patient on Epogen 4000 units IV with dialysis tomorrow.  Recheck serum phosphorus tomorrow as well.   LOS: 3 Lynora Dymond 12/11/20182:47 PM  [facility] [place], [place]

## 2017-01-05 NOTE — NC FL2 (Signed)
[place] LEVEL OF CARE SCREENING TOOL     IDENTIFICATION  Patient [name] Birthdate: Nov 13, 1926 Sex: female Admission Date (Current Location): 01/01/2017  [place] and [place] Number:  Engineering geologist and Address:  [facility], [address], [place], [place] [address]      Provider Number: [id number]  Attending Physician Name and Address:  Loletha Grayer, MD  Relative Name and Phone Number:  Riddle,Edward Brother [phone] or Dunn,Tracy Niece [phone]     Current Level of Care: Hospital Recommended Level of Care: [place] Prior Approval Number:    Date Approved/Denied:   PASRR Number: [id number]  Discharge Plan: SNF    Current Diagnoses: Patient Active Problem List   Diagnosis Date Noted  . Pulmonary embolism ([place]) 01/02/2017  . Syncope 01/01/2017  . Vision changes 08/27/2016  . Closed fracture of neck of femur ([place]) 05/26/2016  . Hip fracture ([place]) 05/10/2016  . Falls 04/27/2016  . Left hand pain 04/27/2016  . Head injury 04/27/2016  . Age-related osteoporosis with current pathological fracture with routine healing 04/24/2016  . Recurrent falls 04/24/2016  . Mild protein-calorie malnutrition ([place]) 04/24/2016  . ESRD on dialysis ([place]) 04/10/2016  . Other fatigue 03/27/2016  . Periprosthetic fracture around internal prosthetic left knee joint 01/26/2016  . Pressure injury of skin 12/15/2015  . Closed left subtrochanteric femur fracture ([place]) 12/15/2015  . Femur fracture, left ([place]) 12/14/2015  . Meniere disease   . Loss of weight 10/21/2015  . Clinical depression 06/20/2015  . Combined fat and carbohydrate induced hyperlipemia 06/20/2015  . Dysphagia, unspecified 05/24/2015  . Shortness of breath 05/24/2015  . Imbalance 04/22/2015  . Electrical shock sensation in right posterior head 11/22/2014  . Expressive aphasia 10/04/2014  . Breathlessness on exertion 09/13/2014  .  Chronic pain syndrome 05/22/2014  . Insomnia 03/13/2014  . Anxiety state 03/13/2014  . CAD (coronary artery disease) 01/15/2014  . Chronic systolic CHF (congestive heart failure), NYHA class 3 ([place]) 01/15/2014  . OSA (obstructive sleep apnea) 01/15/2014  . Chronic kidney disease (CKD), stage V ([place]) 01/15/2014  . Basal cell carcinoma of neck 11/14/2013  . DDD (degenerative disc disease), cervical 11/07/2013  . Cervical radiculitis 10/16/2013  . Benign essential hypertension 09/12/2013  . Pelvic pain in female 09/12/2013  . Neck pain of over 3 months duration 09/12/2013  . Memory loss 09/12/2013  . Systolic heart failure, chronic ([place]) 05/12/2013  . Sinus node dysfunction ([place]) 08/01/2012  . Low back pain 08/01/2012  . Cervical spine pain 05/02/2012  . Irritable bowel syndrome 11/02/2011  . Depression 04/01/2011  . Hypertension 04/01/2011  . Menopausal disorder 04/01/2011    Orientation RESPIRATION BLADDER Height & Weight     Self, Time, Situation, Place  Normal Continent Weight: 93 lb 3.2 oz (42.3 kg) Height:  5\' 4"  (162.6 cm)  BEHAVIORAL SYMPTOMS/MOOD NEUROLOGICAL BOWEL NUTRITION STATUS      Continent Diet(Cardiac)  AMBULATORY STATUS COMMUNICATION OF NEEDS Skin   Limited Assist Verbally PU Stage and Appropriate Care                       Personal Care Assistance Level of Assistance  Bathing, Feeding, Dressing Bathing Assistance: Limited assistance Feeding assistance: Independent Dressing Assistance: Limited assistance     Functional Limitations Info  Sight, Hearing, Speech Sight Info: Adequate Hearing Info: Adequate Speech Info: Adequate    SPECIAL CARE FACTORS FREQUENCY  PT (By licensed PT), OT (By licensed OT)  PT Frequency: 5x a week OT Frequency: 5x a week            Contractures Contractures Info: Not present    Additional Factors Info  Allergies, Psychotropic   Allergies Info: STATINS, CODEINE SULFATE, CODEINE, EFFEXOR VENLAFAXINE,  EZETIMIBE-SIMVASTATIN Psychotropic Info: sertraline (ZOLOFT) tablet 100 mg          Current Medications (01/05/2017):  This is the current hospital active medication list Current Facility-Administered Medications  Medication Dose Route Frequency Provider Last Rate Last Dose  . acetaminophen (TYLENOL) tablet 650 mg  650 mg Oral Q6H PRN [name], MD   650 mg at 01/03/17 5188   Or  . acetaminophen (TYLENOL) suppository 650 mg  650 mg Rectal Q6H PRN Sudini, Alveta Heimlich, MD      . albuterol (PROVENTIL) (2.5 MG/3ML) 0.083% nebulizer solution 2.5 mg  2.5 mg Nebulization Q2H PRN Sudini, Alveta Heimlich, MD      . apixaban Arne [place]) tablet 2.5 mg  2.5 mg Oral BID Loletha Grayer, MD   2.5 mg at 01/05/17 0959  . cefTRIAXone (ROCEPHIN) 1 g in dextrose 5 % 50 mL IVPB  1 g Intravenous Q24H Loletha Grayer, MD   Stopped at 01/05/17 1740  . [START ON 01/06/2017] epoetin alfa (EPOGEN,PROCRIT) injection 10,000 Units  10,000 Units Intravenous Q M,W,F-HD Lateef, [place], MD      . feeding supplement (NEPRO CARB STEADY) liquid 237 mL  237 mL Oral BID BM Loletha Grayer, MD   237 mL at 01/05/17 1521  . levothyroxine (SYNTHROID, LEVOTHROID) tablet 25 mcg  25 mcg Oral QAC breakfast [name], MD   25 mcg at 01/05/17 0959  . metoprolol succinate (TOPROL-XL) 24 hr tablet 25 mg  25 mg Oral Daily [name], MD   25 mg at 01/05/17 0959  . multivitamin (RENA-VIT) tablet 1 tablet  1 tablet Oral QHS Wieting, Richard, MD      . Derrill Memo ON 01/07/2017] multivitamin with minerals tablet 1 tablet  1 tablet Oral Once per day on Mon Thu Wieting, Richard, MD      . ondansetron [facility]) tablet 4 mg  4 mg Oral Q6H PRN [name], MD       Or  . ondansetron (ZOFRAN) injection 4 mg  4 mg Intravenous Q6H PRN [name], MD   4 mg at 01/02/17 1717  . polyethylene glycol (MIRALAX / GLYCOLAX) packet 17 g  17 g Oral Daily PRN Sudini, Alveta Heimlich, MD      . ramipril (ALTACE) capsule 2.5 mg  2.5 mg Oral QHS Loletha Grayer, MD   2.5  mg at 01/04/17 2205  . sertraline (ZOLOFT) tablet 100 mg  100 mg Oral QHS [name], MD   100 mg at 01/04/17 2205  . traMADol (ULTRAM) tablet 50 mg  50 mg Oral Q6H PRN Sudini, Alveta Heimlich, MD      . vitamin C (ASCORBIC ACID) tablet 500 mg  500 mg Oral BID Loletha Grayer, MD   500 mg at 01/05/17 1016     Discharge Medications: Please see discharge summary for a list of discharge medications.  Relevant Imaging Results:  Relevant Lab Results:   Additional Information SSN [id number]  Ross Ludwig, [place]

## 2017-01-06 DIAGNOSIS — I2699 Other pulmonary embolism without acute cor pulmonale: Secondary | ICD-10-CM

## 2017-01-06 DIAGNOSIS — R0602 Shortness of breath: Secondary | ICD-10-CM

## 2017-01-06 DIAGNOSIS — N186 End stage renal disease: Secondary | ICD-10-CM

## 2017-01-06 DIAGNOSIS — I2510 Atherosclerotic heart disease of native coronary artery without angina pectoris: Secondary | ICD-10-CM

## 2017-01-06 LAB — CULTURE, BLOOD (ROUTINE X 2)
CULTURE: NO GROWTH
Culture: NO GROWTH
SPECIAL REQUESTS: ADEQUATE
Special Requests: ADEQUATE

## 2017-01-06 LAB — CYTOLOGY - NON PAP

## 2017-01-06 LAB — HEPATITIS B SURFACE ANTIBODY, QUANTITATIVE: HEPATITIS B-POST: 12.4 m[IU]/mL

## 2017-01-06 LAB — PHOSPHORUS: Phosphorus: 3.5 mg/dL (ref 2.5–4.6)

## 2017-01-06 LAB — HEMOGLOBIN: Hemoglobin: 9.5 g/dL — ABNORMAL LOW (ref 12.0–16.0)

## 2017-01-06 LAB — HEPATITIS B SURFACE ANTIGEN: HEP B S AG: NEGATIVE

## 2017-01-06 MED ORDER — TRAMADOL HCL 50 MG PO TABS
50.0000 mg | ORAL_TABLET | Freq: Once | ORAL | Status: AC
  Administered 2017-01-06: 50 mg via ORAL
  Filled 2017-01-06: qty 1

## 2017-01-06 MED ORDER — CEFAZOLIN SODIUM-DEXTROSE 1-4 GM/50ML-% IV SOLN: 1.0000 g | INTRAVENOUS | Status: DC

## 2017-01-06 MED ORDER — NITROGLYCERIN 0.4 MG SL SUBL: 0.4000 mg | SUBLINGUAL_TABLET | SUBLINGUAL | Status: DC | PRN

## 2017-01-06 MED ORDER — DIPHENOXYLATE-ATROPINE 2.5-0.025 MG PO TABS
1.0000 | ORAL_TABLET | Freq: Four times a day (QID) | ORAL | Status: DC | PRN
  Administered 2017-01-06: 1 via ORAL
  Filled 2017-01-06: qty 1

## 2017-01-06 MED ORDER — DEXTROSE 5 % IV SOLN
Freq: Once | INTRAVENOUS | Status: AC
  Administered 2017-01-07: 13:00:00 via INTRAVENOUS
  Filled 2017-01-06: qty 10

## 2017-01-06 NOTE — Progress Notes (Signed)
HD tx start 

## 2017-01-06 NOTE — Progress Notes (Signed)
Pre HD assessment  

## 2017-01-06 NOTE — Plan of Care (Signed)
  Education: Knowledge of General Education information will improve 01/06/2017 1643 - Not Progressing by Daron Offer, RN Note Patient acutely confused throughout periods of the day. Will continue to monitor mentation. Wenda Low [facility]

## 2017-01-06 NOTE — Care Management Important Message (Signed)
Important Message  Patient Details  [name] MRN: [id number] Date of Birth: [birth year]   Medicare Important Message Given:  Yes  Signed IM notice given  Katrina Stack, RN 01/06/2017, 1:40 PM

## 2017-01-06 NOTE — Progress Notes (Signed)
Palliative NP met with brother, Ed at bedside. Continue FULL code/FULL scope treatment. Full note to follow.   NO CHARGE  Ihor Dow, FNP-C Palliative Medicine Team  Phone: [phone] Fax: [phone]

## 2017-01-06 NOTE — Progress Notes (Signed)
HD tx end  

## 2017-01-06 NOTE — Consult Note (Signed)
Consultation Note Date: 01/06/17  Patient [name]  DOB: 13-Oct-1926  MRN: [id number]  Age / Sex: 83 y.o., female  PCP: Kirk Ruths, MD Referring Physician: Loletha Grayer, MD  Reason for Consultation: Establishing goals of care  HPI/Patient Profile: 83 y.o. female  with past medical history of ESRD on HD, MI, cardiomyopathy, CAD, pacemaker, depression, arthritis, and skin cancer admitted on 01/01/2017 after syncopal event. Patient had dialysis after syncopal event and was sent to ED after. In ED, chest xray revealed concern for right-sided pneumonia. CTA of chest revealed right lower lob subsegmental PE. Initially started on heparin infusion but patient started to bleed from fistula site. Low-dose eliquis was then initiated by attending. Patient's dialysis catheter infiltrated on 12/12 and she is no longer a candidate for anticoagulation. Also with right pleural effusion s/p thoracentesis. Palliative medicine consultation for goals of care.   Clinical Assessment and Goals of Care: I have reviewed medical records, discussed with care team, and met with brother, Ed at bedside to discuss diagnosis , prognosis, GOC, and disposition options. Patient arrived back from HD during our conversation but is pleasantly confused.   Introduced Palliative Medicine as specialized medical care for people living with serious illness. It focuses on providing relief from the symptoms and stress of a serious illness. The goal is to improve quality of life for both the patient and the family.  Ed tells me a brief life review of the patient. She is widowed with no children. She was living home independently and on HS peritoneal dialysis (for 5 years) until a fall last November. In the last year, patient has had multiple falls with decline and now receiving HD. Ms. Morath was approved for Medicaid in September and has  been living at [facility] for long term care.   We discussed hospital diagnoses, interventions, and underlying co-morbidities. After discussion with Dr. Leslye Peer, Ed understands she is not a candidate for further anticoagulation secondary to bleeding. He understands dialysis is a form of life support.   Advanced directives, concepts specific to code status, and artifical feeding and hydration were discussed. The patient has a documented living will and Ed is HCPOA. Reviewed in epic. Introduced MOST form. Ed states "she is a full code" and confirms continued aggressive care. Educated on MOST form and medical recommendation for DNR with age, frailty, and underlying co-morbidities. Encouraged Ed to review MOST form and consider big decisions he is faced with in the future.   Palliative Care services outpatient were explained and offered. Ed agreeable.   He is concerned about her returning back to [facility] and not getting a higher level of care. Deferred to SW to answer questions and concerns.   Therapeutic listening as Ed shared many stories of [place].     SUMMARY OF RECOMMENDATIONS    HCPOA confirms continued FULL code/FULL scope treatment.   Introduced and educated on MOST form.   Palliative services to follow patient at SNF on discharge.   PMT will continue to support patient/family  through hospitalization.   Code Status/Advance Care Planning:  Full code  Symptom Management:   Per attending  Palliative Prophylaxis:   Aspiration, Delirium Protocol, Oral Care and Turn Reposition  Additional Recommendations (Limitations, Scope, Preferences):  Full Scope Treatment  Psycho-social/Spiritual:   Desire for further Chaplaincy support: yes  Additional Recommendations: Caregiving  Support/Resources and Education on Hospice  Prognosis:   Unable to determine  Discharge Planning: To Be Determined long-term care facility with palliative     Primary Diagnoses: Present  on Admission: . Syncope . Pulmonary embolism ([place])   I have reviewed the medical record, interviewed the patient and family, and examined the patient. The following aspects are pertinent.  Past Medical History:  Diagnosis Date  . Arthritis   . CAD (coronary artery disease)   . Cancer ([place])    skin  . Cardiomyopathy ([place])   . Depression   . IBS (irritable bowel syndrome) 2010  . Kidney failure July 2012   Hemodialysis 3xweek  . Kidney failure   . Meniere disease   . Meniere's disease   . Myocardial infarction ([place])   . OSA (obstructive sleep apnea)    CPAP  . Peritoneal dialysis status ([place])   . Presence of permanent cardiac pacemaker   . Renal insufficiency    Social History   Socioeconomic History  . Marital status: Widowed    Spouse name: None  . Number of children: 0  . Years of education: None  . Highest education level: None  Social Needs  . Financial resource strain: None  . Food insecurity - worry: None  . Food insecurity - inability: None  . Transportation needs - medical: None  . Transportation needs - non-medical: None  Occupational History  . Occupation: Retired   Tobacco Use  . Smoking status: Former Smoker    Last attempt to quit: 03/31/1948    Years since quitting: 68.8  . Smokeless tobacco: Never Used  Substance and Sexual Activity  . Alcohol use: No  . Drug use: No  . Sexual activity: No  Other Topics Concern  . None  Social History Narrative   Lives in [place] alone. Widow 2012.      Regular Exercise -  Housework   Daily Caffeine Use:  1 - 2 cups coffee            Family History  Problem Relation Age of Onset  . Cancer Mother        ? ovarian - sarcoma  . Cancer Father        Skin cancer  . Diabetes Sister   . COPD Sister   . Depression Sister   . Cancer Sister        Lung - 50 yrs old   Scheduled Meds: . epoetin (EPOGEN/PROCRIT) injection  10,000 Units Intravenous Q M,W,F-HD  . feeding supplement (NEPRO CARB STEADY)  237  mL Oral BID BM  . levothyroxine  25 mcg Oral QAC breakfast  . metoprolol succinate  25 mg Oral Daily  . multivitamin  1 tablet Oral QHS  . multivitamin with minerals  1 tablet Oral Once per day on Mon Thu  . ramipril  2.5 mg Oral QHS  . sertraline  100 mg Oral QHS  . vitamin C  500 mg Oral BID   Continuous Infusions: . small volume/piggyback builder    . cefTRIAXone (ROCEPHIN)  IV Stopped (01/06/17 1610)   PRN Meds:.acetaminophen **OR** acetaminophen, albuterol, diphenoxylate-atropine, nitroGLYCERIN, ondansetron **OR** ondansetron (ZOFRAN) IV, polyethylene glycol, traMADol  Medications Prior to Admission:  Prior to Admission medications   Medication Sig Start Date End Date Taking? Authorizing Provider  diazepam (VALIUM) 5 MG tablet Take 2.5 mg by mouth daily as needed for anxiety.  11/20/16  Yes [provider]  folic acid-vitamin b complex-vitamin c-selenium-zinc (DIALYVITE) 3 MG TABS tablet Take 1 tablet by mouth daily.   Yes [provider]  levothyroxine (SYNTHROID, LEVOTHROID) 25 MCG tablet Take 1 tablet (25 mcg total) by mouth daily. Patient taking differently: Take 25 mcg by mouth daily. (0800) 10/25/15  Yes [name], MD  meloxicam (MOBIC) 7.5 MG tablet Take 7.5 mg by mouth daily.   Yes [provider]  metoprolol succinate (TOPROL XL) 25 MG 24 hr tablet TAKE 1/2 BY MOUTH DAILY Patient taking differently: Take 25 mg by mouth daily.  01/30/16  Yes [name], MD  metoprolol tartrate (LOPRESSOR) 25 MG tablet Take 25 mg by mouth every evening.  12/22/16  Yes [provider]  sertraline (ZOLOFT) 50 MG tablet TAKE 3 TABLETS(150 MG) BY MOUTH DAILY 08/30/15  Yes [name], MD  Vitamin D, Ergocalciferol, (DRISDOL) 50000 units CAPS capsule Take 50,000 Units by mouth every Monday. (0900) 04/17/15  Yes [provider]  acetaminophen (TYLENOL) 325 MG tablet Take 2 tablets (650 mg total) by mouth every 4 (four) hours as needed for  mild pain or moderate pain. 05/14/16   Loletha Grayer, MD  diphenoxylate-atropine (LOMOTIL) 2.5-0.025 MG tablet TAKE 1 TABLET BY MOUTH FOUR TIMES DAILY AS NEEDED FOR DIARRHEA OR LOOSE STOOLS Patient taking differently: TAKE 1 TABLET BY MOUTH every 6 hours AS NEEDED FOR DIARRHEA OR LOOSE STOOLS 08/30/15   [name], MD  lidocaine-prilocaine (EMLA) cream Apply 1 application topically every Monday, Wednesday, and Friday with hemodialysis. (1100) apply to right arm dialysis access pre-dialysis wrap with saran wrap after applying.    [provider]  metoCLOPramide (REGLAN) 5 MG tablet Take 1 tablet (5 mg total) by mouth every 8 (eight) hours as needed (vertigo). 05/30/16   Menshew, Dannielle Karvonen, PA-C  mupirocin ointment (BACTROBAN) 2 % Apply 1 application topically 2 (two) times daily. Patient not taking: Reported on 01/02/2017 09/17/16   Frederich Cha, MD  traMADol (ULTRAM) 50 MG tablet Take 1 tablet (50 mg total) by mouth every 12 (twelve) hours as needed for severe pain. Patient not taking: Reported on 01/02/2017 05/14/16   Loletha Grayer, MD   Allergies  Allergen Reactions  . Statins Other (See Comments)    Muscle weakness Muscle weakness severe  . Codeine Sulfate Nausea And Vomiting  . Codeine Other (See Comments)    GI UPSET  . Effexor [Venlafaxine] Nausea Only  . Ezetimibe-Simvastatin Other (See Comments)    Muscle weakness   Review of Systems  Respiratory: Positive for chest tightness.   Neurological: Positive for weakness.   Physical Exam  Constitutional: She is cooperative.  Cardiovascular: Regular rhythm.  Pulmonary/Chest: Effort normal.  Neurological: She is alert.  Pleasantly confused  Skin: Skin is warm and dry.  Nursing note and vitals reviewed.  Vital Signs: BP (!) 125/58   Pulse 62   Temp 97.7 F (36.5 C) (Oral)   Resp 16   Ht '5\' 4"'$  (1.626 m)   Wt 43.1 kg (95 lb)   SpO2 98%   BMI 16.31 kg/m  Pain Assessment: No/denies pain   Pain Score: 0-No  pain  SpO2: SpO2: 98 % O2 Device:SpO2: 98 % O2 Flow Rate: .O2 Flow Rate (L/min): 2  L/min  IO: Intake/output summary:   Intake/Output Summary (Last 24 hours) at 01/07/2017 0925 Last data filed at 01/06/2017 1900 Gross per 24 hour  Intake 0 ml  Output -242 ml  Net 242 ml    LBM: Last BM Date: 01/05/17 Baseline Weight: Weight: 43.5 kg (95 lb 12.8 oz) Most recent weight: Weight: 43.1 kg (95 lb)     Palliative Assessment/Data:   Flowsheet Rows     Most Recent Value  Intake Tab  Referral Department  Hospitalist  Unit at Time of Referral  Cardiac/Telemetry Unit  Palliative Care Primary Diagnosis  Other (Comment)  Palliative Care Type  New Palliative care  Date first seen by Palliative Care  01/06/17  Clinical Assessment  Palliative Performance Scale Score  40%  Psychosocial & Spiritual Assessment  Palliative Care Outcomes  Patient/Family meeting held?  Yes  Who was at the meeting?  patient and brother Education officer, community)  Palliative Care Outcomes  Clarified goals of care, Provided psychosocial or spiritual support, Linked to palliative care logitudinal support, ACP counseling assistance      Time In: 1300 Time Out: 1410 Time Total: 70 min Greater than 50%  of this time was spent counseling and coordinating care related to the above assessment and plan.  Signed by:  Ihor Dow, FNP-C Palliative Medicine Team  Phone: [phone] Fax: [phone]   Please contact Palliative Medicine Team phone at [phone] for questions and concerns.  For individual provider: See Shea Evans

## 2017-01-06 NOTE — Progress Notes (Signed)
Post HD assessment  

## 2017-01-06 NOTE — Plan of Care (Signed)
  Progressing Education: Knowledge of General Education information will improve 01/06/2017 0305 - Progressing by Loran Senters, RN Activity: Ability to tolerate increased activity will improve 01/06/2017 0305 - Progressing by Loran Senters, RN Fluid Volume: Compliance with measures to maintain balanced fluid volume will improve 01/06/2017 0305 - Progressing by Loran Senters, RN

## 2017-01-06 NOTE — Progress Notes (Signed)
New system set up, tx restarted

## 2017-01-06 NOTE — Consult Note (Signed)
[facility] Vascular Consult Note  MRN : [id number]  Sharon [name] is a 83 y.o. ([birth year]) female who presents with chief complaint of  Chief Complaint  Patient presents with  . Fall  .  History of Present Illness: I am asked to evaluate the patient by Dr. Leslye Peer.  Patient was initially admitted with shortness of breath.  Workup identified both a pleural effusion as well as a new pulmonary embolism.  Subsequently duplex ultrasound of the lower extremities was negative.  She was initially placed on a heparin drip but given her multiple skin tears hemorrhage ensued and her heparin was stopped.  As a secondary attempt a low-dose of Eliquis was administered however earlier today she developed a significant hematoma while on hemodialysis and there is now question as to whether her AV access in her right arm is usable until this resolves.  This evening at the time of my interview she continues to have some pleuritic chest pains but only mild shortness of breath.  She denies hemoptysis.  There is no past history of DVT or PE.    Current Meds  Medication Sig  . diazepam (VALIUM) 5 MG tablet Take 2.5 mg by mouth daily as needed for anxiety.   . folic acid-vitamin b complex-vitamin c-selenium-zinc (DIALYVITE) 3 MG TABS tablet Take 1 tablet by mouth daily.  Marland Kitchen levothyroxine (SYNTHROID, LEVOTHROID) 25 MCG tablet Take 1 tablet (25 mcg total) by mouth daily. (Patient taking differently: Take 25 mcg by mouth daily. (0800))  . meloxicam (MOBIC) 7.5 MG tablet Take 7.5 mg by mouth daily.  . metoprolol succinate (TOPROL XL) 25 MG 24 hr tablet TAKE 1/2 BY MOUTH DAILY (Patient taking differently: Take 25 mg by mouth daily. )  . metoprolol tartrate (LOPRESSOR) 25 MG tablet Take 25 mg by mouth every evening.   . sertraline (ZOLOFT) 50 MG tablet TAKE 3 TABLETS(150 MG) BY MOUTH DAILY  . Vitamin D, Ergocalciferol, (DRISDOL) 50000 units CAPS capsule Take 50,000 Units by mouth every  Monday. (0900)    Past Medical History:  Diagnosis Date  . Arthritis   . CAD (coronary artery disease)   . Cancer ([place])    skin  . Cardiomyopathy ([place])   . Depression   . IBS (irritable bowel syndrome) 2010  . Kidney failure July 2012   Hemodialysis 3xweek  . Kidney failure   . Meniere disease   . Meniere's disease   . Myocardial infarction ([place])   . OSA (obstructive sleep apnea)    CPAP  . Peritoneal dialysis status ([place])   . Presence of permanent cardiac pacemaker   . Renal insufficiency     Past Surgical History:  Procedure Laterality Date  . [place]  . ANUS SURGERY  2010  . AV FISTULA PLACEMENT Right 04/15/2016   Procedure: ARTERIOVENOUS (AV) FISTULA CREATION ( RADIOCEPHALIC ) STAGE 2;  Surgeon: Algernon Huxley, MD;  Location: [facility];  Service: Vascular;  Laterality: Right;  . CATARACT EXTRACTION  2006, 2011  . CHOLECYSTECTOMY  01/2008  . DIALYSIS/PERMA CATHETER REMOVAL N/A 08/20/2016   Procedure: Dialysis/Perma Catheter Removal;  Surgeon: Algernon Huxley, MD;  Location: [facility];  Service: Cardiovascular;  Laterality: N/A;  . EYE SURGERY     cataracts bilateral  . FEMUR IM NAIL Left 12/15/2015   Procedure: INTRAMEDULLARY (IM) RETROGRADE FEMORAL NAILING;  Surgeon: Oletta Cohn, DO;  Location: [facility];  Service: Orthopedics;  Laterality: Left;  . HIP PINNING,CANNULATED Left 05/10/2016  Procedure: CANNULATED HIP PINNING;  Surgeon: Earnestine Leys, MD;  Location: [facility];  Service: Orthopedics;  Laterality: Left;  . INSERTION OF DIALYSIS CATHETER  07/2010  . JOINT REPLACEMENT     left knee  . MINOR REMOVAL OF PERITONEAL DIALYSIS CATHETER  04/15/2016   Procedure: MINOR REMOVAL OF PERITONEAL DIALYSIS CATHETER;  Surgeon: Algernon Huxley, MD;  Location: [facility];  Service: Vascular;;  . PACEMAKER INSERTION  2006  . PERIPHERAL VASCULAR CATHETERIZATION N/A 12/18/2015   Procedure: Dialysis/Perma Catheter Insertion;  Surgeon: Katha Cabal, MD;   Location: [facility];  Service: Cardiovascular;  Laterality: N/A;  . TOTAL KNEE ARTHROPLASTY  2008   LEFT/Dr [place]    Social History Social History   Tobacco Use  . Smoking status: Former Smoker    Last attempt to quit: 03/31/1948    Years since quitting: 68.8  . Smokeless tobacco: Never Used  Substance Use Topics  . Alcohol use: No  . Drug use: No    Family History Family History  Problem Relation Age of Onset  . Cancer Mother        ? ovarian - sarcoma  . Cancer Father        Skin cancer  . Diabetes Sister   . COPD Sister   . Depression Sister   . Cancer Sister        Lung - 40 yrs old  No family history of bleeding/clotting disorders, porphyria or autoimmune disease   Allergies  Allergen Reactions  . Statins Other (See Comments)    Muscle weakness Muscle weakness severe  . Codeine Sulfate Nausea And Vomiting  . Codeine Other (See Comments)    GI UPSET  . Effexor [Venlafaxine] Nausea Only  . Ezetimibe-Simvastatin Other (See Comments)    Muscle weakness     REVIEW OF SYSTEMS (Negative unless checked)  Constitutional: [] Weight loss  [] Fever  [] Chills Cardiac: [x] Chest pain   [] Chest pressure   [] Palpitations   [] Shortness of breath when laying flat   [] Shortness of breath at rest   [x] Shortness of breath with exertion. Vascular:  [] Pain in legs with walking   [] Pain in legs at rest   [] Pain in legs when laying flat   [] Claudication   [] Pain in feet when walking  [] Pain in feet at rest  [] Pain in feet when laying flat   [x] History of DVT   [] Phlebitis   [] Swelling in legs   [] Varicose veins   [] Non-healing ulcers Pulmonary:   [] Uses home oxygen   [] Productive cough   [] Hemoptysis   [] Wheeze  [] COPD   [] Asthma Neurologic:  [] Dizziness  [] Blackouts   [] Seizures   [] History of stroke   [] History of TIA  [] Aphasia   [] Temporary blindness   [] Dysphagia   [] Weakness or numbness in arms   [] Weakness or numbness in legs Musculoskeletal:  [] Arthritis   [] Joint  swelling   [] Joint pain   [] Low back pain Hematologic:  [x] Easy bruising  [] Easy bleeding   [] Hypercoagulable state   [] Anemic  [] Hepatitis Gastrointestinal:  [] Blood in stool   [] Vomiting blood  [] Gastroesophageal reflux/heartburn   [] Difficulty swallowing. Genitourinary:  [x] Chronic kidney disease   [] Difficult urination  [] Frequent urination  [] Burning with urination   [] Blood in urine Skin:  [x] Rashes   [] Ulcers   [x] Wounds Psychological:  [] History of anxiety   []  History of major depression.  Physical Examination  Vitals:   01/06/17 1230 01/06/17 1241 01/06/17 1248 01/06/17 1645  BP: (!) 131/49 (!) 120/47 (!) 130/54 Marland Kitchen)  150/47  Pulse: 61 61 61 (!) 59  Resp: 14 12 13 18   Temp:   97.7 F (36.5 C) 97.7 F (36.5 C)  TempSrc:   Oral Oral  SpO2: 98% 98% 98% 96%  Weight:      Height:       Body mass index is 16.19 kg/m. Gen:  WD/WN, mild distress Head: [place]/AT, No temporalis wasting. Prominent temp pulse not noted. Ear/Nose/Throat: Hearing grossly intact, nares w/o erythema or drainage, oropharynx w/o Erythema/Exudate Eyes: PERRLA, EOMI.  Neck: Supple, no nuchal rigidity.  No bruit or JVD.  Pulmonary:  Good air movement, clear to auscultation bilaterally.  Cardiac: RRR, normal S1, S2, no Murmurs, rubs or gallops. Vascular: Right forearm AV access large hematoma good thrill good bruit Vessel Right Left  Radial Palpable Palpable  Ulnar Palpable Palpable  Brachial Palpable Palpable  Gastrointestinal: soft, non-tender/non-distended. No guarding/reflex. No masses, surgical incisions, or scars. Musculoskeletal: M/S 4/5 throughout.  Extremities without ischemic changes.  No deformity or atrophy. No edema. Neurologic: CN 2-12 intact. Pain and light touch intact in extremities.  Symmetrical.  Speech is fluent. Motor exam as listed above. Psychiatric: Judgment intact, Mood & affect appropriate for pt's clinical situation. Dermatologic: Large bruises but no ulcers noted.  No cellulitis but  multiple skin tears wounds.  CBC Lab Results  Component Value Date   WBC 5.9 01/03/2017   HGB 9.5 (L) 01/06/2017   HCT 27.0 (L) 01/03/2017   MCV 95.0 01/03/2017   PLT 200 01/03/2017    BMET    Component Value Date/Time   NA 139 01/02/2017 0435   NA 142 04/24/2013 1557   K 3.1 (L) 01/02/2017 0435   K 3.8 04/24/2013 1557   CL 100 (L) 01/02/2017 0435   CL 106 04/24/2013 1557   CO2 28 01/02/2017 0435   CO2 32 04/24/2013 1557   GLUCOSE 117 (H) 01/02/2017 0435   GLUCOSE 109 (H) 04/24/2013 1557   BUN 31 (H) 01/02/2017 0435   BUN 40 (A) 09/03/2014   BUN 30 (H) 04/24/2013 1557   CREATININE 1.96 (H) 01/02/2017 0435   CREATININE 2.68 (H) 04/24/2013 1557   CALCIUM 8.7 (L) 01/02/2017 0435   CALCIUM 8.7 04/24/2013 1557   GFRNONAA 22 (L) 01/02/2017 0435   GFRNONAA 16 (L) 04/24/2013 1557   GFRAA 25 (L) 01/02/2017 0435   GFRAA 18 (L) 04/24/2013 1557   Estimated Creatinine Clearance: 13.4 mL/min (A) (by C-G formula based on SCr of 1.96 mg/dL (H)).  COAG Lab Results  Component Value Date   INR 1.39 01/01/2017   INR 1.14 11/10/2016   INR 1.08 05/10/2016    Radiology Dg Chest 1 View  Result Date: 01/04/2017 CLINICAL DATA:  Post thoracentesis, history coronary disease, cardiomyopathy, former smoker artery disease post MI EXAM: CHEST 1 VIEW COMPARISON:  01/03/2017 FINDINGS: Slight decrease in RIGHT pleural effusion and basilar atelectasis post thoracentesis. No pneumothorax. Enlargement of cardiac silhouette with pulmonary vascular congestion. Minimal residual perihilar edema. Pacemaker leads project over RIGHT atrium and RIGHT ventricle unchanged. Atherosclerotic calcifications aorta. Bronchitic changes and underlying emphysematous changes again seen. IMPRESSION: No pneumothorax following RIGHT thoracentesis. Slightly decreased RIGHT pleural effusion and basilar atelectasis. Enlargement of cardiac silhouette with vascular congestion and minimal pulmonary edema. Electronically Signed    By: Lavonia Dana M.D.   On: 01/04/2017 11:35   Dg Chest 2 View  Result Date: 01/01/2017 CLINICAL DATA:  Mid chest pain.  Fall today. EXAM: CHEST  2 VIEW COMPARISON:  One-view chest x-ray 10/11/2016. FINDINGS:  The heart is enlarged. A large right pleural effusion and airspace disease is present. Asymmetric edema is present in the right lung. Changes of COPD are noted. The left lung is clear. Pacing wires are in place. Aortic atherosclerosis is present at the aortic arch. The visualized soft tissues and bony thorax are unremarkable. IMPRESSION: 1. Right lower lobe pneumonia and effusion. 2. Asymmetric right-sided edema associated cardiomegaly. 3.  Aortic Atherosclerosis (ICD10-I70.0). Electronically Signed   By: San Morelle M.D.   On: 01/01/2017 18:09   Dg Sternum  Result Date: 01/03/2017 CLINICAL DATA:  Patient reports pain behind upper portion of sternum upon deep inspiration onset yesterday. No injury to sternum. No previous history of any injury to sternum. Hx pacemaker insetion, MI, cardiomyopathy. Former smoker. EXAM: STERNUM - 2+ VIEW COMPARISON:  Chest CT, 01/01/2017 FINDINGS: No fracture. No bone lesion. Skeletal structures are demineralized. Right pleural effusion with associated atelectasis unchanged from the previous day's CT scan. Heart is enlarged. Left lung essentially clear. IMPRESSION: No sternal fracture or bone lesion. Edema noted on the previous day's chest radiograph appears resolved. No change in the right pleural effusion or associated atelectasis. Electronically Signed   By: Lajean Manes M.D.   On: 01/03/2017 10:54   Ct Head Wo Contrast  Result Date: 01/01/2017 CLINICAL DATA:  83 year old female status post fall this morning presents with head trauma and headache. EXAM: CT HEAD WITHOUT CONTRAST TECHNIQUE: Contiguous axial images were obtained from the base of the skull through the vertex without intravenous contrast. COMPARISON:  12/03/2016 FINDINGS: BRAIN: There is chronic  mild sulcal and ventricular prominence consistent with superficial and central atrophy. No intraparenchymal hemorrhage, mass effect nor midline shift. Periventricular and subcortical white matter hypodensities consistent with chronic small vessel ischemic disease are identified. No acute large vascular territory infarcts. No abnormal extra-axial fluid collections. Basal cisterns are not effaced and midline. VASCULAR: Moderate calcific atherosclerosis of the carotid siphons, both vertebral arteries and basilar artery. SKULL: No skull fracture. No significant scalp soft tissue swelling. SINUSES/ORBITS: The mastoid air-cells are clear. The included paranasal sinuses are well-aerated.The included ocular globes and orbital contents are non-suspicious. OTHER: None. IMPRESSION: Atrophy with chronic small vessel ischemic disease. No acute intracranial abnormality. No skull fracture. Electronically Signed   By: Ashley Royalty M.D.   On: 01/01/2017 18:01   Ct Angio Chest Pe W And/or Wo Contrast  Result Date: 01/01/2017 CLINICAL DATA:  PE suspected, low pretest probability. Mid chest pain with deep inspiration. EXAM: CT ANGIOGRAPHY CHEST WITH CONTRAST TECHNIQUE: Multidetector CT imaging of the chest was performed using the standard protocol during bolus administration of intravenous contrast. Multiplanar CT image reconstructions and MIPs were obtained to evaluate the vascular anatomy. CONTRAST:  44mL ISOVUE-370 IOPAMIDOL (ISOVUE-370) INJECTION 76% COMPARISON:  Two-view chest x-ray from the same day. FINDINGS: Cardiovascular: The heart is enlarged. The right ventricular ratio is less than 0.9. Coronary artery calcifications are present. Dense calcifications are present at the aortic arch without aneurysm or definite stenosis of the great vessel origins. Pulmonary arterial opacification is excellent. There are filling defects in the lateral posterior basilar segmental artery is of the right lower lobe. Right middle lobe and  right upper lobe artery is opacified normally. There significant emboli are present on the left. Mediastinum/Nodes: No significant mediastinal or axillary adenopathy is present. Lungs/Pleura: A large right pleural effusion is present. There is partial collapse of the right lower lobe. Ground-glass attenuation in the right upper and middle lobe likely reflects atelectasis. Mild left basilar atelectasis  is present. Upper Abdomen: Dense vascular calcifications are present. The kidneys are atrophic. Musculoskeletal: Vertebral body heights alignment are normal. No focal lytic or blastic lesions are present. Review of the MIP images confirms the above findings. IMPRESSION: 1. Right lower lobe segmental pulmonary emboli. 2. Large right pleural effusion and partial collapse of the right lower lobe. 3. Cardiomegaly with ground-glass attenuation reflecting atelectasis or edema. 4.  Aortic Atherosclerosis (ICD10-I70.0). 5. Coronary artery disease. Critical Value/emergent results were called by telephone at the time of interpretation on 01/01/2017 at 7:33 pm to Dr. Charlotte Crumb , who verbally acknowledged these results. Electronically Signed   By: San Morelle M.D.   On: 01/01/2017 19:34   US Venous Img Lower Bilateral  Result Date: 01/02/2017 CLINICAL DATA:  Pulmonary embolism EXAM: BILATERAL LOWER EXTREMITY VENOUS DOPPLER ULTRASOUND TECHNIQUE: Gray-scale sonography with compression, as well as color and duplex ultrasound, were performed to evaluate the deep venous system from the level of the common femoral vein through the popliteal and proximal calf veins. Technologist describes technically limited examination secondary to patient motion and lack of cooperation. COMPARISON:  None FINDINGS: Normal compressibility of the common femoral, superficial femoral, and popliteal veins, as well as the proximal calf veins. No filling defects to suggest DVT on grayscale or color Doppler imaging. Doppler waveforms show normal  direction of venous flow, normal respiratory phasicity and response to augmentation. IMPRESSION: No evidence of  lower extremity deep vein thrombosis, bilaterally. Electronically Signed   By: Lucrezia Europe M.D.   On: 01/02/2017 13:29   US Thoracentesis Asp Pleural Space W/img Guide  Result Date: 01/04/2017 INDICATION: 83 year old with right pleural effusion. EXAM: ULTRASOUND GUIDED RIGHT THORACENTESIS MEDICATIONS: None. COMPLICATIONS: None immediate. PROCEDURE: An ultrasound guided thoracentesis was thoroughly discussed with the patient and questions answered. The benefits, risks, alternatives and complications were also discussed. The patient understands and wishes to proceed with the procedure. Written consent was obtained. Ultrasound was performed to localize and mark an adequate pocket of fluid in the right chest. The area was then prepped and draped in the normal sterile fashion. 1% Lidocaine was used for local anesthesia. Under ultrasound guidance a 6 Fr Safe-T-Centesis catheter was introduced. Thoracentesis was performed. The catheter was removed and a dressing applied. FINDINGS: A total of approximately 550 mL of yellow fluid was removed. Samples were sent to the laboratory as requested by the clinical team. IMPRESSION: Successful ultrasound guided right thoracentesis yielding 550 mL of pleural fluid. Electronically Signed   By: Markus Daft M.D.   On: 01/04/2017 11:29     Assessment/Plan 1.  Pulmonary embolism: Patient has failed anticoagulation and is a new pulmonary embolism by CT.  Therefore IVC filter is advised.  The plan will be to remove the filter in 4-6 months.  The risks and benefits as well as alternatives such as anticoagulation were again reviewed with the patient questions been answered and the patient is agreed to proceed.  2.  Complication of dialysis access: Patient is sustained a large hematoma and it is doubtful that cannulating her AV access tomorrow will be feasible.  I will  contact Dr. Holley Raring and ask if he can run her first thing in the morning.  Should they be unable to cannulate we will move forward with placement of a tunneled dialysis catheter which would actually be put right through the jugular access for her IVC filter.  This would then combine both the insertion site as well as the sedation minimizing her risk.  Again, the risks  and benefits as well as the indications were reviewed with the patient she is agreed to proceed.  3.  End-stage renal disease: Plans have been made to continue her dialysis therapy tomorrow either with accessing her AV access or via a newly placed tunneled catheter.  4.  Coronary artery disease:  Continue cardiac and antihypertensive medications as already ordered and reviewed, no changes at this time.  Continue statin as ordered and reviewed, no changes at this time  Nitrates PRN for chest pain  5.  Hypertension:  Continue antihypertensive medications as already ordered, these medications have been reviewed and there are no changes at this time.     Hortencia Pilar, MD  01/06/2017 5:14 PM

## 2017-01-06 NOTE — Progress Notes (Signed)
Patient ID: Sharon [name], female   DOB: August 10, 1926, 82 y.o.   MRN: [id number]  ACP note  Brother and patient at the bedside.  Diagnosis: Right lower lobe pulmonary embolism.  Patient bled from dialysis access and skin tears while on heparin drip.  Blood thinner was held for a few days and then I restarted low-dose Eliquis.  Patient's dialysis catheter has infiltrated.  Case discussed with nephrology that the patient is no longer a candidate for anticoagulation.  Other diagnosis end-stage renal disease on hemodialysis, right pleural effusion status post thoracentesis.  Patient and brother would like to be aggressive with therapy and be a full code.  Time spent on ACP discussion 17 minutes  Dr. Loletha Grayer

## 2017-01-06 NOTE — Progress Notes (Signed)
[place], [place] 01/06/17  Subjective:  Patient seen and evaluated during hemodialysis. She appears to be tolerating well. There was an acess infiltration initially.   Objective:  Vital signs in last 24 hours:  Temp:  [97.3 F (36.3 C)-97.7 F (36.5 C)] 97.3 F (36.3 C) (12/12 0340) Pulse Rate:  [59-61] 61 (12/12 0832) Resp:  [18] 18 (12/12 0832) BP: (116-144)/(48-89) 144/89 (12/12 0832) SpO2:  [90 %-98 %] 98 % (12/12 0832) Weight:  [42.8 kg (94 lb 4.8 oz)] 42.8 kg (94 lb 4.8 oz) (12/12 0340)  Weight change: 0.499 kg (1 lb 1.6 oz) Filed Weights   01/04/17 0600 01/05/17 0500 01/06/17 0340  Weight: 44.4 kg (97 lb 14.4 oz) 42.3 kg (93 lb 3.2 oz) 42.8 kg (94 lb 4.8 oz)    Intake/Output:    Intake/Output Summary (Last 24 hours) at 01/06/2017 1221 Last data filed at 01/06/2017 0353 Gross per 24 hour  Intake -  Output 0 ml  Net 0 ml     Physical Exam: General:  Frail, elderly, thin, laying in the bed  HEENT  anicteric, moist oral mucous membranes, decreased hearing  Neck  supple  Pulm/lungs  decreased breath sounds at bases, nromale ffort  CVS/Heart  irregular rhythm  Abdomen:   Soft, nontender  Extremities:  No pitting edema  Neurologic:  Alert, able to follow commands  Skin:  B/L upper extremity ecchymoses  Access:  Right arm AV fistula       Basic Metabolic Panel:  Recent Labs  Lab 01/01/17 1725 01/02/17 0435 01/04/17 1722  NA 139 139  --   K 3.4* 3.1*  --   CL 100* 100*  --   CO2 28 28  --   GLUCOSE 142* 117*  --   BUN 21* 31*  --   CREATININE 1.47* 1.96*  --   CALCIUM 8.8* 8.7*  --   PHOS  --   --  4.9*     CBC: Recent Labs  Lab 01/01/17 1725 01/02/17 0435 01/02/17 1415 01/03/17 0554 01/04/17 0529 01/06/17 0321  WBC 5.0 5.3  --  5.9  --   --   NEUTROABS 3.4  --   --   --   --   --   HGB 10.6* 9.7* 9.9* 9.0* 9.4* 9.5*  HCT 31.5* 28.6*  --  27.0*  --   --   MCV 93.7 93.0  --  95.0  --   --   PLT 214 197   --  200  --   --       Lab Results  Component Value Date   HEPBSAG Negative 01/04/2017      Microbiology:  Recent Results (from the past 240 hour(s))  Culture, blood (routine x 2)     Status: None   Collection Time: 01/01/17  6:39 PM  Result Value Ref Range Status   Specimen Description BLOOD LEFT ANTECUBITAL  Final   Special Requests   Final    BOTTLES DRAWN AEROBIC AND ANAEROBIC Blood Culture adequate volume   Culture NO GROWTH 5 DAYS  Final   Report Status 01/06/2017 FINAL  Final  Culture, blood (routine x 2)     Status: None   Collection Time: 01/01/17  6:48 PM  Result Value Ref Range Status   Specimen Description BLOOD LEFT HAND  Final   Special Requests   Final    BOTTLES DRAWN AEROBIC AND ANAEROBIC Blood Culture adequate volume   Culture NO GROWTH 5  DAYS  Final   Report Status 01/06/2017 FINAL  Final  MRSA PCR Screening     Status: None   Collection Time: 01/01/17 10:16 PM  Result Value Ref Range Status   MRSA by PCR NEGATIVE NEGATIVE Final    Comment:        The GeneXpert MRSA Assay (FDA approved for NASAL specimens only), is one component of a comprehensive MRSA colonization surveillance program. It is not intended to diagnose MRSA infection nor to guide or monitor treatment for MRSA infections.     Coagulation Studies: No results for input(s): LABPROT, INR in the last 72 hours.  Urinalysis: No results for input(s): COLORURINE, LABSPEC, PHURINE, GLUCOSEU, HGBUR, BILIRUBINUR, KETONESUR, PROTEINUR, UROBILINOGEN, NITRITE, LEUKOCYTESUR in the last 72 hours.  Invalid input(s): APPERANCEUR    Imaging: No results found.   Medications:   . cefTRIAXone (ROCEPHIN)  IV Stopped (01/05/17 1740)   . apixaban  2.5 mg Oral BID  . epoetin (EPOGEN/PROCRIT) injection  10,000 Units Intravenous Q M,W,F-HD  . feeding supplement (NEPRO CARB STEADY)  237 mL Oral BID BM  . levothyroxine  25 mcg Oral QAC breakfast  . metoprolol succinate  25 mg Oral Daily  .  multivitamin  1 tablet Oral QHS  . [START ON 01/07/2017] multivitamin with minerals  1 tablet Oral Once per day on Mon Thu  . ramipril  2.5 mg Oral QHS  . sertraline  100 mg Oral QHS  . vitamin C  500 mg Oral BID   acetaminophen **OR** acetaminophen, albuterol, diphenoxylate-atropine, nitroGLYCERIN, ondansetron **OR** ondansetron (ZOFRAN) IV, polyethylene glycol, traMADol  Assessment/ Plan:  83 y.o. Caucasian female with end-stage renal disease, left femur fracture, anemia, hypertension, coronary disease, atherosclerosis, cardiomyopathy, history of peritoneal dialysis presents for evaluation of fall after syncope  [facility]/MWF/[place] [facility]/195 minutes / 43.5 kg  1.  End-stage renal disease  2.  Anemia of chronic kidney disease 3.  Secondary hyperparathyroidism 4.  Right lower lobe subsegmental pulmonary embolus 5.  Syncope 6.  Hypokalemia  Plan:  Patient seen and evaluated during hemodialysis.  She appears to be tolerating well.  We will maintain the patient on Epogen 10,000 units IV with dialysis as most recent hemoglobin was 9.5.  Serum phosphorus acceptable at 4.9.  Overall she remains still very weak.  She will likely be transition to a rehabilitation facility upon discharge.   LOS: 4 Sharon [name] 12/12/201812:21 PM  [facility] [place], [place]

## 2017-01-06 NOTE — Clinical Social Work Note (Signed)
CSW was informed by palliative and the physician that patient's brother is interested in finding a different SNF.  CSW attempted to contact patient's brother, however CSW was not able to get a hold of him.  CSW left a message at brother's residence awaiting for call back.  CSW to continue to follow patient's progress throughout discharge planning.  Jones Broom. [place], MSW, [place]  01/06/2017 3:27 PM

## 2017-01-06 NOTE — Progress Notes (Signed)
Patient ID: Sharon [name], female   DOB: Sep 29, 1926, 83 y.o.   MRN: [id number]   [facility] PROGRESS NOTE  Sharon [name] YIR:[id number] DOB: August 01, 1926 DOA: 01/01/2017 PCP: Kirk Ruths, MD  HPI/Subjective: Patient seen this morning.  She was complaining of chest pain at that time.  I have ordered for tramadol but to be given.  Patient seen later in the afternoon and was feeling better.  Objective: Vitals:   01/06/17 1241 01/06/17 1248  BP: (!) 120/47 (!) 130/54  Pulse: 61 61  Resp: 12 13  Temp:  97.7 F (36.5 C)  SpO2: 98% 98%    Filed Weights   01/04/17 0600 01/05/17 0500 01/06/17 0340  Weight: 44.4 kg (97 lb 14.4 oz) 42.3 kg (93 lb 3.2 oz) 42.8 kg (94 lb 4.8 oz)    ROS: Review of Systems  Constitutional: Negative for chills and fever.  Eyes: Negative for blurred vision.  Respiratory: Negative for cough and shortness of breath.   Cardiovascular: Positive for chest pain.  Gastrointestinal: Negative for abdominal pain, constipation, diarrhea, nausea and vomiting.  Genitourinary: Negative for dysuria.  Musculoskeletal: Negative for joint pain.  Neurological: Negative for dizziness and headaches.   Exam: Physical Exam  Constitutional: She is oriented to person, place, and time.  HENT:  Nose: No mucosal edema.  Mouth/Throat: No oropharyngeal exudate or posterior oropharyngeal edema.  Eyes: Conjunctivae, EOM and lids are normal. Pupils are equal, round, and reactive to light.  Neck: No JVD present. Carotid bruit is not present. No edema present. No thyroid mass and no thyromegaly present.  Cardiovascular: S1 normal and S2 normal. Exam reveals no gallop.  No murmur heard. Pulses:      Dorsalis pedis pulses are 2+ on the right side, and 2+ on the left side.  Respiratory: No respiratory distress. She has decreased breath sounds in the right lower field and the left lower field. She has no wheezes. She has rhonchi in the right lower field. She has no rales.   GI: Soft. Bowel sounds are normal. There is no tenderness.  Musculoskeletal:       Right shoulder: She exhibits no swelling.       Right ankle: She exhibits no swelling.       Left ankle: She exhibits no swelling.  Lymphadenopathy:    She has no cervical adenopathy.  Neurological: She is alert and oriented to person, place, and time. No cranial nerve deficit.  Skin: Skin is warm. Nails show no clubbing.  Patient skin tear is not actively bleeding.  Still has a little blood tinge on the packing over the dialysis site.  Psychiatric: She has a normal mood and affect.      Data Reviewed: Basic Metabolic Panel: Recent Labs  Lab 01/01/17 1725 01/02/17 0435 01/04/17 1722 01/06/17 0321  NA 139 139  --   --   K 3.4* 3.1*  --   --   CL 100* 100*  --   --   CO2 28 28  --   --   GLUCOSE 142* 117*  --   --   BUN 21* 31*  --   --   CREATININE 1.47* 1.96*  --   --   CALCIUM 8.8* 8.7*  --   --   PHOS  --   --  4.9* 3.5   CBC: Recent Labs  Lab 01/01/17 1725 01/02/17 0435 01/02/17 1415 01/03/17 0554 01/04/17 0529 01/06/17 0321  WBC 5.0 5.3  --  5.9  --   --  NEUTROABS 3.4  --   --   --   --   --   HGB 10.6* 9.7* 9.9* 9.0* 9.4* 9.5*  HCT 31.5* 28.6*  --  27.0*  --   --   MCV 93.7 93.0  --  95.0  --   --   PLT 214 197  --  200  --   --    Cardiac Enzymes: Recent Labs  Lab 01/01/17 1725 01/01/17 2134 01/02/17 0435  TROPONINI 0.10* 0.10* 0.12*     Recent Results (from the past 240 hour(s))  Culture, blood (routine x 2)     Status: None   Collection Time: 01/01/17  6:39 PM  Result Value Ref Range Status   Specimen Description BLOOD LEFT ANTECUBITAL  Final   Special Requests   Final    BOTTLES DRAWN AEROBIC AND ANAEROBIC Blood Culture adequate volume   Culture NO GROWTH 5 DAYS  Final   Report Status 01/06/2017 FINAL  Final  Culture, blood (routine x 2)     Status: None   Collection Time: 01/01/17  6:48 PM  Result Value Ref Range Status   Specimen Description BLOOD  LEFT HAND  Final   Special Requests   Final    BOTTLES DRAWN AEROBIC AND ANAEROBIC Blood Culture adequate volume   Culture NO GROWTH 5 DAYS  Final   Report Status 01/06/2017 FINAL  Final  MRSA PCR Screening     Status: None   Collection Time: 01/01/17 10:16 PM  Result Value Ref Range Status   MRSA by PCR NEGATIVE NEGATIVE Final    Comment:        The GeneXpert MRSA Assay (FDA approved for NASAL specimens only), is one component of a comprehensive MRSA colonization surveillance program. It is not intended to diagnose MRSA infection nor to guide or monitor treatment for MRSA infections.      Studies: No results found.  Scheduled Meds: . epoetin (EPOGEN/PROCRIT) injection  10,000 Units Intravenous Q M,W,F-HD  . feeding supplement (NEPRO CARB STEADY)  237 mL Oral BID BM  . levothyroxine  25 mcg Oral QAC breakfast  . metoprolol succinate  25 mg Oral Daily  . multivitamin  1 tablet Oral QHS  . [START ON 01/07/2017] multivitamin with minerals  1 tablet Oral Once per day on Mon Thu  . ramipril  2.5 mg Oral QHS  . sertraline  100 mg Oral QHS  . vitamin C  500 mg Oral BID   Continuous Infusions: . cefTRIAXone (ROCEPHIN)  IV 1 g (01/06/17 1538)    Assessment/Plan:  1. Right lower lobe pulmonary embolism with syncope.  The patient's dialysis catheter infiltrated while trying to do dialysis today.  Case discussed with Dr. Holley Raring nephrology and he believes that this is secondary to the blood thinner. The patient is no longer a candidate for any anticoagulation.  I am unable to treat pulmonary embolism with blood thinner at this time.  Case discussed with vascular surgery and they will see the patient for consideration of an IVC filter.  The patient's lower extremity Doppler was negative for DVT. 2. Large right pleural effusion.  Thoracentesis done with good response with respiratory status. 3. Questionable pneumonia on CT scan on Rocephin and finished Zithromax. 4. End-stage renal  disease on hemodialysis.   nephrology will try to attempt dialysis tomorrow morning.  If infiltrates again then may need a permacath. 5. Hypothyroidism unspecified on levothyroxine 6. History of CAD.  Metoprolol. 7. Severe cardiomyopathy.  Continue Altace at  night.  On metoprolol.  Dialysis to manage fluid.  Not a candidate for Spironolactone in a dialysis patient. 8. Pain in the sternum.  Could be bone bruise from fall  9. Weakness.  Physical therapy evaluation. 10. Confusion resolved.  Code Status:     Code Status Orders  (From admission, onward)        Start     Ordered   01/01/17 2025  Full code  Continuous     01/01/17 2027    Code Status History    Date Active Date Inactive Code Status Order ID Comments User Context   05/12/2016 08:09 05/14/2016 14:39 DNR [id number]  Max Sane, MD Inpatient   05/10/2016 05:06 05/12/2016 08:09 Full Code [id number]  Saundra Shelling, MD Inpatient   12/15/2015 15:15 12/19/2015 01:59 Full Code [id number]  Oletta Cohn, DO Inpatient   12/14/2015 16:13 12/15/2015 15:15 Full Code [id number]  Bettey Costa, MD Inpatient     Family Communication: I spoke with the brother at the bedside Disposition Plan: Since the patient unable to tolerate blood thinner, we will have to make sure that we are able to do dialysis through her access or else she would need a Pneumovax.  Consultants:  Nephrology  Cardiology  Oncology   vascular surgery   Antibiotics:  Rocephin  Zithromax finished  Time spent: 45 minutes including ACP time and discussion with vascular surgery and nephrology  [place]

## 2017-01-06 NOTE — Progress Notes (Signed)
PT Cancellation Note  Patient Details [name] MRN: [id number] DOB: [birth year]   Cancelled Treatment:    Reason Eval/Treat Not Completed: Fatigue/lethargy limiting ability to participate(Treatment session attempted.  Patient lethargic, difficulty maintaining eyes open/arousal this PM.  Noted to be chewing on food in mouth, but does not swallow; encouraged to spit out and mouth cleaned out for safety.  Tray removed until patient more alert.  RN informed/aware.  Mobility/OOB attempts deferred at this time due to lethargy.  Will continue efforts next date.)   Taeshaun Rames H. Owens Shark, PT, DPT, NCS 01/06/17, 3:36 PM [phone]

## 2017-01-06 NOTE — Progress Notes (Signed)
Patient complaining of sudden onset of sharp chest pain that "is in my sternal and it goes everywhere". EKG completed, unremarkable. MD Wieting made aware. No new orders at this time. Oncoming nurse updated.

## 2017-01-06 NOTE — Progress Notes (Signed)
Patient complaining of loose stools, requesting Lomotil. MD Pyreddy made aware. Verbal order given to continue Lomotil every 6 hours as needed for loose stools. Will administer and continue to monitor.   Sharon [name] M

## 2017-01-07 ENCOUNTER — Encounter
Admission: EM | Disposition: A | Discharge status: Skilled Nursing Facility | Payer: Self-pay | Source: Home / Self Care | Attending: Internal Medicine

## 2017-01-07 DIAGNOSIS — R55 Syncope and collapse: Secondary | ICD-10-CM

## 2017-01-07 DIAGNOSIS — J90 Pleural effusion, not elsewhere classified: Secondary | ICD-10-CM

## 2017-01-07 DIAGNOSIS — I871 Compression of vein: Secondary | ICD-10-CM

## 2017-01-07 DIAGNOSIS — Z515 Encounter for palliative care: Secondary | ICD-10-CM

## 2017-01-07 DIAGNOSIS — Z992 Dependence on renal dialysis: Secondary | ICD-10-CM

## 2017-01-07 DIAGNOSIS — I2699 Other pulmonary embolism without acute cor pulmonale: Secondary | ICD-10-CM

## 2017-01-07 DIAGNOSIS — N186 End stage renal disease: Secondary | ICD-10-CM

## 2017-01-07 DIAGNOSIS — Z7189 Other specified counseling: Secondary | ICD-10-CM

## 2017-01-07 HISTORY — PX: DIALYSIS/PERMA CATHETER INSERTION: CATH118288

## 2017-01-07 SURGERY — DIALYSIS/PERMA CATHETER INSERTION
Anesthesia: Moderate Sedation

## 2017-01-07 MED ORDER — HEPARIN (PORCINE) IN NACL 2-0.9 UNIT/ML-% IJ SOLN
INTRAMUSCULAR | Status: AC
  Filled 2017-01-07: qty 500

## 2017-01-07 MED ORDER — FENTANYL CITRATE (PF) 100 MCG/2ML IJ SOLN
INTRAMUSCULAR | Status: AC
  Filled 2017-01-07: qty 2

## 2017-01-07 MED ORDER — CEFAZOLIN SODIUM-DEXTROSE 1-4 GM/50ML-% IV SOLN
INTRAVENOUS | Status: AC
  Filled 2017-01-07: qty 50

## 2017-01-07 MED ORDER — HEPARIN SODIUM (PORCINE) 10000 UNIT/ML IJ SOLN
INTRAMUSCULAR | Status: AC
  Filled 2017-01-07: qty 1

## 2017-01-07 MED ORDER — LIDOCAINE-EPINEPHRINE (PF) 1 %-1:200000 IJ SOLN
INTRAMUSCULAR | Status: AC
  Filled 2017-01-07: qty 30

## 2017-01-07 MED ORDER — MIDAZOLAM HCL 2 MG/2ML IJ SOLN
INTRAMUSCULAR | Status: DC | PRN
  Administered 2017-01-07 (×2): 1 mg via INTRAVENOUS

## 2017-01-07 MED ORDER — IOPAMIDOL (ISOVUE-300) INJECTION 61%
INTRAVENOUS | Status: DC | PRN
  Administered 2017-01-07: 25 mL via INTRA_ARTERIAL

## 2017-01-07 MED ORDER — MIDAZOLAM HCL 5 MG/5ML IJ SOLN
INTRAMUSCULAR | Status: AC
  Filled 2017-01-07: qty 5

## 2017-01-07 MED ORDER — HEPARIN SODIUM (PORCINE) 1000 UNIT/ML IJ SOLN
INTRAMUSCULAR | Status: AC
  Filled 2017-01-07: qty 1

## 2017-01-07 MED ORDER — FENTANYL CITRATE (PF) 100 MCG/2ML IJ SOLN
INTRAMUSCULAR | Status: DC | PRN
Start: 2017-01-07 — End: 2017-01-07
  Administered 2017-01-07 (×2): 25 ug via INTRAVENOUS

## 2017-01-07 SURGICAL SUPPLY — 19 items
BALLN DORADO 8X100X80 (BALLOONS) ×3
BALLOON DORADO 8X100X80 (BALLOONS) ×1 IMPLANT
CANNULA 5F STIFF (CANNULA) ×3 IMPLANT
CATH BEACON 5 .035 40 KMP TP (CATHETERS) ×1 IMPLANT
CATH BEACON 5 .038 40 KMP TP (CATHETERS) ×2
CATH PALINDROME RT-P 15FX19CM (CATHETERS) IMPLANT
CATH PALINDROME RT-P 15FX23CM (CATHETERS) ×3 IMPLANT
DERMABOND ADVANCED (GAUZE/BANDAGES/DRESSINGS) ×2
DERMABOND ADVANCED .7 DNX12 (GAUZE/BANDAGES/DRESSINGS) ×1 IMPLANT
DEVICE PRESTO INFLATION (MISCELLANEOUS) ×3 IMPLANT
FILTER CELECT-JUGULAR (Filter) ×3 IMPLANT
GUIDEWIRE SUPER STIFF .035X180 (WIRE) ×3 IMPLANT
PACK ANGIOGRAPHY (CUSTOM PROCEDURE TRAY) ×3 IMPLANT
SHEATH 9FRX11 (SHEATH) ×3 IMPLANT
SUT MNCRL AB 4-0 PS2 18 (SUTURE) ×3 IMPLANT
SUT PROLENE 0 CT 1 30 (SUTURE) ×3 IMPLANT
SUTURE VIC 3-0 (SUTURE) ×3 IMPLANT
TOWEL OR 17X26 4PK STRL BLUE (TOWEL DISPOSABLE) ×3 IMPLANT
WIRE J 3MM .035X145CM (WIRE) ×3 IMPLANT

## 2017-01-07 NOTE — Progress Notes (Signed)
HD TX ended, pt sent to [facility] to obtain CVC for HD

## 2017-01-07 NOTE — Progress Notes (Signed)
HD TX started  

## 2017-01-07 NOTE — Progress Notes (Signed)
HD TX ended  

## 2017-01-07 NOTE — Op Note (Signed)
OPERATIVE NOTE    PRE-OPERATIVE DIAGNOSIS: 1. ESRD 2.  Infiltration of AV fistula making it unusable 3.  PE with bleeding on anticoagulation  POST-OPERATIVE DIAGNOSIS: same as above  PROCEDURE: 1. Ultrasound guidance for vascular access to the left internal jugular vein 2. Catheter placement into the inferior vena cava from left jugular approach 3. Placement of a Cook select IVC filter 4. Left jugular venogram, innominate venogram, and superior venacavogram 5. Percutaneous transluminal angioplasty left jugular vein, innominate vein, and superior vena cava with 8 mm diameter by 10 cm length angioplasty balloon 6. Fluoroscopic guidance for placement of catheter 7. Placement of a 23 cm tip to cuff tunneled hemodialysis catheter via the left internal jugular vein  SURGEON: Leotis Pain, MD  ANESTHESIA:  Local with Moderate conscious sedation for approximately 35 minutes using 2 mg of Versed and 50 Mcg of Fentanyl  ESTIMATED BLOOD LOSS: 10 cc  FLUORO TIME: 3 minutes  CONTRAST: 25 cc  FINDING(S): 1.  Patent left internal jugular vein but stenosis was around her pacer wires in the left innominate vein and superior vena cava initially prohibiting placement of the peel-away sheath for the dialysis catheter placement  SPECIMEN(S):  None  INDICATIONS:   Sharon [name] is a 83 y.o. female who presents with infiltration of her extremity AV fistula occluding its use.  She also has a pulmonary embolus and has had bleeding on anticoagulation and this will have to be stopped.  For this reason she will need an IVC filter, but she will also need a PermCath while her infiltration heals.  The patient needs long term dialysis access for their ESRD, and a Permcath is necessary.  Risks and benefits are discussed and informed consent is obtained.    DESCRIPTION: After obtaining full informed written consent, the patient was brought back to the vascular suited. The patient's left neck and chest were  sterilely prepped and draped in a sterile surgical field was created. Moderate conscious sedation was administered during a face to face encounter with the patient throughout the procedure with my supervision of the RN administering medicines and monitoring the patient's vital signs, pulse oximetry, telemetry and mental status throughout from the start of the procedure until the patient was taken to the recovery room.  The left internal jugular vein was visualized with ultrasound and found to be patent. It was then accessed under direct ultrasound guidance and a permanent image was recorded. A wire was placed although it did not pass particularly easily and there was found to be a stenosis around the superior vena cava.  I was able to get a wire across this and advance the IVC filter delivery sheath across this although there was some resistance.  An inferior venacavogram was performed showing the IVC to be patent and an IVC filter was then deployed through this delivery sheath just below the renal veins at the level of L2.  At this point, I tried to place the peel-away sheath over the wire, but it would not easily pass.  I exchanged for an Amplatz Super Stiff wire after placing the Kumpe catheter into the inferior vena cava exchanging the J-wire that we were using out.  I then placed a 9 French sheath and performed a left jugular venogram and superior venacavogram.  There was marked narrowing around the pacer wires.  The degree of stenosis was difficult to discern but appeared to be greater than 70%.  I then used an 8 mm diameter by 10 cm length angioplasty  balloon and treated from the superior vena cava across the innominate vein and up to the proximal jugular vein.  This was inflated to 12 atm for 1 minute.  Completion venogram showed much better flow around the pacer wires in the vein with what appeared to be a less than 30% residual stenosis.  I then proceeded with catheter placement. After skin nick and  dilatation, the peel-away sheath was placed over the wire. I then turned my attention to an area under the clavicle. Approximately 1-2 fingerbreadths below the clavicle a small counterincision was created and tunneled from the subclavicular incision to the access site. Using fluoroscopic guidance, a 23 centimeter tip to cuff tunneled hemodialysis catheter was selected, and tunneled from the subclavicular incision to the access site. It was then placed through the peel-away sheath and the peel-away sheath was removed. Using fluoroscopic guidance the catheter tips were parked in the right atrium. The appropriate distal connectors were placed. It withdrew blood well and flushed easily with heparinized saline and a concentrated heparin solution was then placed. It was secured to the chest wall with 2 Prolene sutures. The access incision was closed single 4-0 Monocryl. A 4-0 Monocryl pursestring suture was placed around the exit site. Sterile dressings were placed. The patient tolerated the procedure well and was taken to the recovery room in stable condition.  COMPLICATIONS: None  CONDITION: Stable  Leotis Pain  01/07/2017, 2:10 PM   This note was created with Dragon Medical transcription system. Any errors in dictation are purely unintentional.

## 2017-01-07 NOTE — Progress Notes (Signed)
Called dialysis post temp. Cath insertion : dialysis unable to accommodate pt. At present and asks pt. To return to room until they can dialyze her. Pt. In no acute distress. Floor RN made aware and report given to [place], [place].

## 2017-01-07 NOTE — Progress Notes (Signed)
PT Cancellation Note  Patient Details Name: DAMIYA SANDEFUR MRN: [id number] DOB: [birth year]   Cancelled Treatment:    Reason Eval/Treat Not Completed: Patient at procedure or test/unavailable.  Pt currently off floor for tunneled catheter placement with IVC filter placement to follow. RN suspects that pt will then go for dialysis. Will attempt to see pt next date (12/14).    Collie Siad PT, DPT 01/07/2017, 11:55 AM

## 2017-01-07 NOTE — Clinical Social Work Note (Signed)
CSW spoke to patient's brother Percell Miller he spoke to [facility] and he would like her to return back to SNF.  CSW to continue to follow patient's progress throughout discharge planning.    Jones Broom. [place], MSW, [place]  01/07/2017 2:28 PM

## 2017-01-07 NOTE — Progress Notes (Signed)
Patient ID: Sharon [name], female   DOB: Apr 15, 1926, 83 y.o.   MRN: [id number]   [facility] PROGRESS NOTE  Sharon [name] OZH:[id number] DOB: 09/28/1926 DOA: 01/01/2017 PCP: Kirk Ruths, MD  HPI/Subjective: Patient seen down at dialysis.  She went down to dialysis and her right arm access was not working properly.  Then she went to the vascular surgery lab for IVC filter and left chest tunneled catheter.  Now back down at dialysis.  Patient sleepy from sedation from procedure.  She did not have her hearing aid in.  She kept on repeating herself that she is so grateful.  Objective: Vitals:   01/07/17 1445 01/07/17 1500  BP: (!) 150/59 (!) 153/49  Pulse: 60 61  Resp: 11 12  Temp:    SpO2: 95% 95%    Filed Weights   01/06/17 0340 01/07/17 0403 01/07/17 1254  Weight: 42.8 kg (94 lb 4.8 oz) 43.1 kg (95 lb) 43.1 kg (95 lb)    ROS: Review of Systems  Unable to perform ROS: Mental acuity   Exam: Physical Exam  Constitutional: She appears lethargic.  HENT:  Nose: No mucosal edema.  Mouth/Throat: No oropharyngeal exudate or posterior oropharyngeal edema.  Eyes: Conjunctivae, EOM and lids are normal. Pupils are equal, round, and reactive to light.  Neck: No JVD present. Carotid bruit is not present. No edema present. No thyroid mass and no thyromegaly present.  Cardiovascular: S1 normal and S2 normal. Exam reveals no gallop.  No murmur heard. Pulses:      Dorsalis pedis pulses are 2+ on the right side, and 2+ on the left side.  Respiratory: No respiratory distress. She has decreased breath sounds in the right lower field and the left lower field. She has no wheezes. She has rhonchi in the right lower field. She has no rales.  GI: Soft. Bowel sounds are normal. There is no tenderness.  Musculoskeletal:       Right shoulder: She exhibits no swelling.       Right ankle: She exhibits no swelling.       Left ankle: She exhibits no swelling.  Lymphadenopathy:    She has  no cervical adenopathy.  Neurological: She appears lethargic.  Skin: Skin is warm. Nails show no clubbing.  Patient with numerous bruises.  Skin tear on the left elbow.  Psychiatric:  More lethargic today      Data Reviewed: Basic Metabolic Panel: Recent Labs  Lab 01/01/17 1725 01/02/17 0435 01/04/17 1722 01/06/17 0321  NA 139 139  --   --   K 3.4* 3.1*  --   --   CL 100* 100*  --   --   CO2 28 28  --   --   GLUCOSE 142* 117*  --   --   BUN 21* 31*  --   --   CREATININE 1.47* 1.96*  --   --   CALCIUM 8.8* 8.7*  --   --   PHOS  --   --  4.9* 3.5   CBC: Recent Labs  Lab 01/01/17 1725 01/02/17 0435 01/02/17 1415 01/03/17 0554 01/04/17 0529 01/06/17 0321  WBC 5.0 5.3  --  5.9  --   --   NEUTROABS 3.4  --   --   --   --   --   HGB 10.6* 9.7* 9.9* 9.0* 9.4* 9.5*  HCT 31.5* 28.6*  --  27.0*  --   --   MCV 93.7 93.0  --  95.0  --   --   PLT 214 197  --  200  --   --    Cardiac Enzymes: Recent Labs  Lab 01/01/17 1725 01/01/17 2134 01/02/17 0435  TROPONINI 0.10* 0.10* 0.12*     Recent Results (from the past 240 hour(s))  Culture, blood (routine x 2)     Status: None   Collection Time: 01/01/17  6:39 PM  Result Value Ref Range Status   Specimen Description BLOOD LEFT ANTECUBITAL  Final   Special Requests   Final    BOTTLES DRAWN AEROBIC AND ANAEROBIC Blood Culture adequate volume   Culture NO GROWTH 5 DAYS  Final   Report Status 01/06/2017 FINAL  Final  Culture, blood (routine x 2)     Status: None   Collection Time: 01/01/17  6:48 PM  Result Value Ref Range Status   Specimen Description BLOOD LEFT HAND  Final   Special Requests   Final    BOTTLES DRAWN AEROBIC AND ANAEROBIC Blood Culture adequate volume   Culture NO GROWTH 5 DAYS  Final   Report Status 01/06/2017 FINAL  Final  MRSA PCR Screening     Status: None   Collection Time: 01/01/17 10:16 PM  Result Value Ref Range Status   MRSA by PCR NEGATIVE NEGATIVE Final    Comment:        The GeneXpert  MRSA Assay (FDA approved for NASAL specimens only), is one component of a comprehensive MRSA colonization surveillance program. It is not intended to diagnose MRSA infection nor to guide or monitor treatment for MRSA infections.       Scheduled Meds: . epoetin (EPOGEN/PROCRIT) injection  10,000 Units Intravenous Q M,W,F-HD  . feeding supplement (NEPRO CARB STEADY)  237 mL Oral BID BM  . levothyroxine  25 mcg Oral QAC breakfast  . metoprolol succinate  25 mg Oral Daily  . multivitamin  1 tablet Oral QHS  . multivitamin with minerals  1 tablet Oral Once per day on Mon Thu  . ramipril  2.5 mg Oral QHS  . sertraline  100 mg Oral QHS  . vitamin C  500 mg Oral BID   Continuous Infusions: . cefTRIAXone (ROCEPHIN)  IV Stopped (01/06/17 1610)    Assessment/Plan:  1. Right lower lobe pulmonary embolism with syncope. The patient is no longer a candidate for any anticoagulation.  I am unable to treat pulmonary embolism with blood thinner at this time.  IVC filter placed today 2. Large right pleural effusion.  Thoracentesis done with good response with respiratory status. 3. Dialysis access complication.  PermCath placed today. 4. Questionable pneumonia on CT scan on Rocephin and finished Zithromax. 5. End-stage renal disease on hemodialysis.   Currently at dialysis 6. Hypothyroidism unspecified on levothyroxine 7. History of CAD.  Metoprolol. 8. Severe cardiomyopathy.  Continue Altace at night.  On metoprolol.  Dialysis to manage fluid.  Not a candidate for Spironolactone in a dialysis patient. 9. Pain in the sternum.  Could be bone bruise from fall  10. Weakness.  Physical therapy evaluation. 11. Confusion resolved.  Code Status:     Code Status Orders  (From admission, onward)        Start     Ordered   01/01/17 2025  Full code  Continuous     01/01/17 2027    Code Status History    Date Active Date Inactive Code Status Order ID Comments User Context   05/12/2016 08:09  05/14/2016 14:39 DNR [id number]  Manuella Ghazi,  Vipul, MD Inpatient   05/10/2016 05:06 05/12/2016 08:09 Full Code [id number]  Saundra Shelling, MD Inpatient   12/15/2015 15:15 12/19/2015 01:59 Full Code [id number]  Oletta Cohn, DO Inpatient   12/14/2015 16:13 12/15/2015 15:15 Full Code [id number]  Bettey Costa, MD Inpatient     Family Communication: Spoke with brother yesterday Disposition Plan: Hopefully can go back to facility tomorrow.  Consultants:  Nephrology  Cardiology  Oncology   vascular surgery   Antibiotics:  Rocephin  Zithromax finished  Time spent: 25 minutes  [place]

## 2017-01-07 NOTE — Progress Notes (Signed)
Pre Hd 

## 2017-01-07 NOTE — Progress Notes (Signed)
New referral for out patient Palliaitve at [facility] received from [employer]. No discharge date at this time. Patient information faxed to referral.  Flo Shanks RN, BSN, [place] [facility] [place], [facility] [phone] c

## 2017-01-07 NOTE — Progress Notes (Signed)
Daily Progress Note   Patient [name]       Date: 01/07/2017 DOB: [birth year]  Age: 83 y.o. MRN#: [id number] Attending Physician: Loletha Grayer, MD Primary Care Physician: Kirk Ruths, MD Admit Date: 01/01/2017  Reason for Consultation/Follow-up: Establishing goals of care  Subjective/GOC:  Patient off the floor when I rounded.   Spoke with brother, Nelle Don who is HCPOA. I updated him on recommendations for vascular surgery. He agrees with IVC filter placement and tunneled dialysis catheter if necessary.   Ed spoke with individuals from [facility] and expressed his concerns with care. He no longer wishes to pursue placement at a different facility.   Ed asks me about hospice services. I explained that [place] will not be a candidate for hospice services while continuing dialysis and hospice philosophy of comfort focused care. I again discussed MOST form that I gave him yesterday. We discussed dialysis being a form of life support and life prolonging intervention. Hoping for the best but also encouraged Ed to consider "the worst" or a continued decline to the point where she could no longer tolerate HD. At this point, she would be eligible for hospice services.   Ed seems to realize her declining health with underlying co-morbidities, but not at a point to make EOL decisions such as DNR/DNI.   Answered questions and concerns. Emotional support provided.   Length of Stay: 5  Current Medications: Scheduled Meds:  . [MAR Hold] epoetin (EPOGEN/PROCRIT) injection  10,000 Units Intravenous Q M,W,F-HD  . [MAR Hold] feeding supplement (NEPRO CARB STEADY)  237 mL Oral BID BM  . [MAR Hold] levothyroxine  25 mcg Oral QAC breakfast  . [MAR Hold] metoprolol succinate   25 mg Oral Daily  . [MAR Hold] multivitamin  1 tablet Oral QHS  . [MAR Hold] multivitamin with minerals  1 tablet Oral Once per day on Mon Thu  . [MAR Hold] ramipril  2.5 mg Oral QHS  . [MAR Hold] sertraline  100 mg Oral QHS  . [MAR Hold] vitamin C  500 mg Oral BID    Continuous Infusions: . [MAR Hold] cefTRIAXone (ROCEPHIN)  IV Stopped (01/06/17 1610)    PRN Meds: [MAR Hold] acetaminophen **OR** [MAR Hold] acetaminophen, [MAR Hold] albuterol, [MAR Hold] diphenoxylate-atropine, [MAR Hold] nitroGLYCERIN, [MAR Hold] ondansetron **OR** [MAR Hold]  ondansetron (ZOFRAN) IV, [MAR Hold] polyethylene glycol, [MAR Hold] traMADol  Physical Exam-off the floor          Vital Signs: BP (!) 150/59   Pulse 60   Temp (!) 96.5 F (35.8 C) (Axillary)   Resp 11   Ht 5\' 4"  (1.626 m)   Wt 43.1 kg (95 lb)   SpO2 95%   BMI 16.31 kg/m  SpO2: SpO2: 95 % O2 Device: O2 Device: Not Delivered O2 Flow Rate: O2 Flow Rate (L/min): 2 L/min  Intake/output summary:   Intake/Output Summary (Last 24 hours) at 01/07/2017 1457 Last data filed at 01/06/2017 1900 Gross per 24 hour  Intake 0 ml  Output -  Net 0 ml   LBM: Last BM Date: 01/05/17 Baseline Weight: Weight: 43.5 kg (95 lb 12.8 oz) Most recent weight: Weight: 43.1 kg (95 lb)       Palliative Assessment/Data: PPS 40%   Flowsheet Rows     Most Recent Value  Intake Tab  Referral Department  Hospitalist  Unit at Time of Referral  Cardiac/Telemetry Unit  Palliative Care Primary Diagnosis  Other (Comment)  Date Notified  01/03/17  Palliative Care Type  New Palliative care  Reason for referral  Clarify Goals of Care  Date of Admission  01/01/17  Date first seen by Palliative Care  01/06/17  # of days Palliative referral response time  3 Day(s)  # of days IP prior to Palliative referral  2  Clinical Assessment  Palliative Performance Scale Score  40%  Psychosocial & Spiritual Assessment  Palliative Care Outcomes  Patient/Family meeting held?   Yes  Who was at the meeting?  patient and brother Education officer, community)  Palliative Care Outcomes  Clarified goals of care, Provided psychosocial or spiritual support, Linked to palliative care logitudinal support, ACP counseling assistance      Patient Active Problem List   Diagnosis Date Noted  . Pulmonary embolism ([place]) 01/02/2017  . Syncope 01/01/2017  . Vision changes 08/27/2016  . Closed fracture of neck of femur ([place]) 05/26/2016  . Hip fracture ([place]) 05/10/2016  . Falls 04/27/2016  . Left hand pain 04/27/2016  . Head injury 04/27/2016  . Age-related osteoporosis with current pathological fracture with routine healing 04/24/2016  . Recurrent falls 04/24/2016  . Mild protein-calorie malnutrition ([place]) 04/24/2016  . ESRD on dialysis ([place]) 04/10/2016  . Other fatigue 03/27/2016  . Periprosthetic fracture around internal prosthetic left knee joint 01/26/2016  . Pressure injury of skin 12/15/2015  . Closed left subtrochanteric femur fracture ([place]) 12/15/2015  . Femur fracture, left ([place]) 12/14/2015  . Meniere disease   . Loss of weight 10/21/2015  . Clinical depression 06/20/2015  . Combined fat and carbohydrate induced hyperlipemia 06/20/2015  . Dysphagia, unspecified 05/24/2015  . Shortness of breath 05/24/2015  . Imbalance 04/22/2015  . Electrical shock sensation in right posterior head 11/22/2014  . Expressive aphasia 10/04/2014  . Breathlessness on exertion 09/13/2014  . Chronic pain syndrome 05/22/2014  . Insomnia 03/13/2014  . Anxiety state 03/13/2014  . CAD (coronary artery disease) 01/15/2014  . Chronic systolic CHF (congestive heart failure), NYHA class 3 ([place]) 01/15/2014  . OSA (obstructive sleep apnea) 01/15/2014  . Chronic kidney disease (CKD), stage V ([place]) 01/15/2014  . Basal cell carcinoma of neck 11/14/2013  . DDD (degenerative disc disease), cervical 11/07/2013  . Cervical radiculitis 10/16/2013  . Benign essential hypertension 09/12/2013  . Pelvic pain in female  09/12/2013  . Neck pain of over 3 months duration 09/12/2013  .  Memory loss 09/12/2013  . Systolic heart failure, chronic ([place]) 05/12/2013  . Sinus node dysfunction ([place]) 08/01/2012  . Low back pain 08/01/2012  . Cervical spine pain 05/02/2012  . Irritable bowel syndrome 11/02/2011  . Depression 04/01/2011  . Hypertension 04/01/2011  . Menopausal disorder 04/01/2011    Palliative Care Assessment & Plan   Patient Profile:  83 y.o. female  with past medical history of ESRD on HD, MI, cardiomyopathy, CAD, pacemaker, depression, arthritis, and skin cancer admitted on 01/01/2017 after syncopal event. Patient had dialysis after syncopal event and was sent to ED after. In ED, chest xray revealed concern for right-sided pneumonia. CTA of chest revealed right lower lob subsegmental PE. Initially started on heparin infusion but patient started to bleed from fistula site. Low-dose eliquis was then initiated by attending. Patient's dialysis catheter infiltrated on 12/12 and she is no longer a candidate for anticoagulation. Also with right pleural effusion s/p thoracentesis. Palliative medicine consultation for goals of care.   Assessment: ESRD on HD Pulmonary embolism Pleural effusion  Recommendations/Plan:  FULL code/FULL scope   HCPOA asked about hospice services. He wishes to continue HD. I explained that she is not a candidate for hospice services while on HD.   Palliative services to follow patient at LTC facility.   Goals of Care and Additional Recommendations:  Limitations on Scope of Treatment: Full Scope Treatment  Code Status: FULL   Code Status Orders  (From admission, onward)        Start     Ordered   01/01/17 2025  Full code  Continuous     01/01/17 2027    Code Status History    Date Active Date Inactive Code Status Order ID Comments User Context   05/12/2016 08:09 05/14/2016 14:39 DNR [id number]  Max Sane, MD Inpatient   05/10/2016 05:06 05/12/2016 08:09 Full Code  [id number]  Saundra Shelling, MD Inpatient   12/15/2015 15:15 12/19/2015 01:59 Full Code [id number]  Oletta Cohn, DO Inpatient   12/14/2015 16:13 12/15/2015 15:15 Full Code [id number]  Bettey Costa, MD Inpatient       Prognosis:   Unable to determine  Discharge Planning:  To Be Determined  Care plan was discussed with brother (Ed)  Thank you for allowing the Palliative Medicine Team to assist in the care of this patient.   Time In: 0935 Time Out: 1010 Total Time 34min Prolonged Time Billed  no      Greater than 50%  of this time was spent counseling and coordinating care related to the above assessment and plan.  Ihor Dow, FNP-C Palliative Medicine Team  Phone: [phone] Fax: [phone]  Please contact Palliative Medicine Team phone at [phone] for questions and concerns.

## 2017-01-07 NOTE — Care Management (Signed)
Barrier- pulmonary embolus and excessive bleeding from skin tears while on heparin. Tried low dose eliquis and developed hematoma while on dialysis. Concern whether AV fistula is usable until hematoma resolves.  Vascular will attempt IVC filter since patient unable to tolerate anticoagulation. IF AV fistula is not usable will also place tunneled dialysis cath at the same time.

## 2017-01-07 NOTE — H&P (Signed)
[facility] History & Physical Update  The patient was interviewed and re-examined.  The patient's previous History and Physical has been reviewed and is unchanged. Unable to use AVF today with dialysis.  Will need a tunneled catheter and will place IVC filter as well. There is no change in the plan of care. We plan to proceed with the scheduled procedure.  Leotis Pain, MD  01/07/2017, 11:52 AM

## 2017-01-08 ENCOUNTER — Encounter: Payer: Self-pay | Admitting: Vascular Surgery

## 2017-01-08 DIAGNOSIS — I429 Cardiomyopathy, unspecified: Secondary | ICD-10-CM | POA: Diagnosis not present

## 2017-01-08 DIAGNOSIS — R410 Disorientation, unspecified: Secondary | ICD-10-CM | POA: Diagnosis not present

## 2017-01-08 DIAGNOSIS — Z9989 Dependence on other enabling machines and devices: Secondary | ICD-10-CM | POA: Diagnosis not present

## 2017-01-08 DIAGNOSIS — I2782 Chronic pulmonary embolism: Secondary | ICD-10-CM | POA: Diagnosis not present

## 2017-01-08 DIAGNOSIS — M1990 Unspecified osteoarthritis, unspecified site: Secondary | ICD-10-CM | POA: Diagnosis not present

## 2017-01-08 DIAGNOSIS — N2581 Secondary hyperparathyroidism of renal origin: Secondary | ICD-10-CM | POA: Diagnosis not present

## 2017-01-08 DIAGNOSIS — J189 Pneumonia, unspecified organism: Secondary | ICD-10-CM | POA: Diagnosis not present

## 2017-01-08 DIAGNOSIS — E441 Mild protein-calorie malnutrition: Secondary | ICD-10-CM | POA: Diagnosis not present

## 2017-01-08 DIAGNOSIS — H8109 Meniere's disease, unspecified ear: Secondary | ICD-10-CM | POA: Diagnosis not present

## 2017-01-08 DIAGNOSIS — Z992 Dependence on renal dialysis: Secondary | ICD-10-CM | POA: Diagnosis not present

## 2017-01-08 DIAGNOSIS — D509 Iron deficiency anemia, unspecified: Secondary | ICD-10-CM | POA: Diagnosis not present

## 2017-01-08 DIAGNOSIS — E559 Vitamin D deficiency, unspecified: Secondary | ICD-10-CM | POA: Diagnosis not present

## 2017-01-08 DIAGNOSIS — K589 Irritable bowel syndrome without diarrhea: Secondary | ICD-10-CM | POA: Diagnosis not present

## 2017-01-08 DIAGNOSIS — J90 Pleural effusion, not elsewhere classified: Secondary | ICD-10-CM | POA: Diagnosis not present

## 2017-01-08 DIAGNOSIS — I2510 Atherosclerotic heart disease of native coronary artery without angina pectoris: Secondary | ICD-10-CM | POA: Diagnosis not present

## 2017-01-08 DIAGNOSIS — F325 Major depressive disorder, single episode, in full remission: Secondary | ICD-10-CM | POA: Diagnosis not present

## 2017-01-08 DIAGNOSIS — Z950 Presence of cardiac pacemaker: Secondary | ICD-10-CM | POA: Diagnosis not present

## 2017-01-08 DIAGNOSIS — N186 End stage renal disease: Secondary | ICD-10-CM | POA: Diagnosis not present

## 2017-01-08 DIAGNOSIS — D631 Anemia in chronic kidney disease: Secondary | ICD-10-CM | POA: Diagnosis not present

## 2017-01-08 DIAGNOSIS — E039 Hypothyroidism, unspecified: Secondary | ICD-10-CM | POA: Diagnosis not present

## 2017-01-08 DIAGNOSIS — I132 Hypertensive heart and chronic kidney disease with heart failure and with stage 5 chronic kidney disease, or end stage renal disease: Secondary | ICD-10-CM | POA: Diagnosis not present

## 2017-01-08 DIAGNOSIS — I252 Old myocardial infarction: Secondary | ICD-10-CM | POA: Diagnosis not present

## 2017-01-08 DIAGNOSIS — Z9181 History of falling: Secondary | ICD-10-CM | POA: Diagnosis not present

## 2017-01-08 DIAGNOSIS — R42 Dizziness and giddiness: Secondary | ICD-10-CM | POA: Diagnosis not present

## 2017-01-08 DIAGNOSIS — R55 Syncope and collapse: Secondary | ICD-10-CM | POA: Diagnosis not present

## 2017-01-08 DIAGNOSIS — G4733 Obstructive sleep apnea (adult) (pediatric): Secondary | ICD-10-CM | POA: Diagnosis not present

## 2017-01-08 DIAGNOSIS — S72002D Fracture of unspecified part of neck of left femur, subsequent encounter for closed fracture with routine healing: Secondary | ICD-10-CM | POA: Diagnosis not present

## 2017-01-08 DIAGNOSIS — I2699 Other pulmonary embolism without acute cor pulmonale: Secondary | ICD-10-CM | POA: Diagnosis not present

## 2017-01-08 DIAGNOSIS — Z85828 Personal history of other malignant neoplasm of skin: Secondary | ICD-10-CM | POA: Diagnosis not present

## 2017-01-08 DIAGNOSIS — I5022 Chronic systolic (congestive) heart failure: Secondary | ICD-10-CM | POA: Diagnosis not present

## 2017-01-08 DIAGNOSIS — F329 Major depressive disorder, single episode, unspecified: Secondary | ICD-10-CM | POA: Diagnosis not present

## 2017-01-08 MED ORDER — TRAMADOL HCL 50 MG PO TABS: 50.0000 mg | ORAL_TABLET | Freq: Two times a day (BID) | ORAL | 0 refills | Status: DC | PRN

## 2017-01-08 MED ORDER — ASCORBIC ACID 500 MG PO TABS: 500.0000 mg | ORAL_TABLET | Freq: Two times a day (BID) | ORAL | 0 refills | Status: DC

## 2017-01-08 MED ORDER — LIDOCAINE-PRILOCAINE 2.5-2.5 % EX CREA
1.0000 "application " | TOPICAL_CREAM | CUTANEOUS | Status: DC | PRN
  Filled 2017-01-08: qty 5

## 2017-01-08 MED ORDER — ALTEPLASE 2 MG IJ SOLR: 2.0000 mg | Freq: Once | INTRAMUSCULAR | Status: DC | PRN

## 2017-01-08 MED ORDER — HEPARIN SODIUM (PORCINE) 1000 UNIT/ML DIALYSIS
1000.0000 [IU] | INTRAMUSCULAR | Status: DC | PRN
  Filled 2017-01-08: qty 1

## 2017-01-08 MED ORDER — SODIUM CHLORIDE 0.9 % IV SOLN: 100.0000 mL | INTRAVENOUS | Status: DC | PRN

## 2017-01-08 MED ORDER — POLYETHYLENE GLYCOL 3350 17 G PO PACK: 17.0000 g | PACK | Freq: Every day | ORAL | 0 refills | Status: DC | PRN

## 2017-01-08 MED ORDER — RAMIPRIL 2.5 MG PO CAPS: 2.5000 mg | ORAL_CAPSULE | Freq: Every day | ORAL | 0 refills | Status: DC

## 2017-01-08 MED ORDER — METOPROLOL SUCCINATE ER 25 MG PO TB24: 25.0000 mg | ORAL_TABLET | Freq: Every day | ORAL | 0 refills | Status: DC

## 2017-01-08 MED ORDER — PENTAFLUOROPROP-TETRAFLUOROETH EX AERO
1.0000 "application " | INHALATION_SPRAY | CUTANEOUS | Status: DC | PRN
  Filled 2017-01-08: qty 30

## 2017-01-08 MED ORDER — RENA-VITE PO TABS: 1.0000 | ORAL_TABLET | Freq: Every day | ORAL | 0 refills | Status: DC

## 2017-01-08 MED ORDER — LIDOCAINE HCL (PF) 1 % IJ SOLN
5.0000 mL | INTRAMUSCULAR | Status: DC | PRN
  Filled 2017-01-08: qty 5

## 2017-01-08 MED ORDER — NEPRO/CARBSTEADY PO LIQD: 237.0000 mL | Freq: Two times a day (BID) | ORAL | 0 refills | Status: DC

## 2017-01-08 NOTE — Progress Notes (Signed)
HD TX started  

## 2017-01-08 NOTE — Plan of Care (Signed)
Report called to [facility].  Update given to brother.

## 2017-01-08 NOTE — Progress Notes (Signed)
Pre Hd 

## 2017-01-08 NOTE — Progress Notes (Signed)
[facility], [place] 01/08/17  Subjective:  Patient had another infiltration of her dialysis access yesterday. Therefore decision was made to place left internal jugular PermCath. In addition she was having significant bleeding while on anticoagulation. Therefore decision was made for IVC filter to be placed yesterday.   Objective:  Vital signs in last 24 hours:  Temp:  [96.5 F (35.8 C)-98.1 F (36.7 C)] 98 F (36.7 C) (12/14 1051) Pulse Rate:  [57-76] 60 (12/14 1100) Resp:  [0-28] 13 (12/14 1100) BP: (71-163)/(31-78) 109/50 (12/14 1100) SpO2:  [91 %-100 %] 95 % (12/14 1100) Weight:  [43 kg (94 lb 12.8 oz)-43.5 kg (96 lb)] 43.5 kg (95 lb 14.4 oz) (12/14 1051)  Weight change: 0 kg (0 lb) Filed Weights   01/07/17 1541 01/08/17 0334 01/08/17 1051  Weight: 43 kg (94 lb 12.8 oz) 43.5 kg (96 lb) 43.5 kg (95 lb 14.4 oz)    Intake/Output:    Intake/Output Summary (Last 24 hours) at 01/08/2017 1142 Last data filed at 01/08/2017 0044 Gross per 24 hour  Intake -  Output 974 ml  Net -974 ml     Physical Exam: General:  Frail, elderly, thin, laying in the bed  HEENT  anicteric, moist oral mucous membranes, decreased hearing  Neck  supple  Pulm/lungs  decreased breath sounds at bases, nromale ffort  CVS/Heart  irregular rhythm  Abdomen:   Soft, nontender  Extremities:  No pitting edema  Neurologic:  Alert, able to follow commands  Skin:  B/L upper extremity ecchymoses  Access:  Right arm AV fistula       Basic Metabolic Panel:  Recent Labs  Lab 01/01/17 1725 01/02/17 0435 01/04/17 1722 01/06/17 0321  NA 139 139  --   --   K 3.4* 3.1*  --   --   CL 100* 100*  --   --   CO2 28 28  --   --   GLUCOSE 142* 117*  --   --   BUN 21* 31*  --   --   CREATININE 1.47* 1.96*  --   --   CALCIUM 8.8* 8.7*  --   --   PHOS  --   --  4.9* 3.5     CBC: Recent Labs  Lab 01/01/17 1725 01/02/17 0435 01/02/17 1415 01/03/17 0554 01/04/17 0529  01/06/17 0321  WBC 5.0 5.3  --  5.9  --   --   NEUTROABS 3.4  --   --   --   --   --   HGB 10.6* 9.7* 9.9* 9.0* 9.4* 9.5*  HCT 31.5* 28.6*  --  27.0*  --   --   MCV 93.7 93.0  --  95.0  --   --   PLT 214 197  --  200  --   --       Lab Results  Component Value Date   HEPBSAG Negative 01/04/2017      Microbiology:  Recent Results (from the past 240 hour(s))  Culture, blood (routine x 2)     Status: None   Collection Time: 01/01/17  6:39 PM  Result Value Ref Range Status   Specimen Description BLOOD LEFT ANTECUBITAL  Final   Special Requests   Final    BOTTLES DRAWN AEROBIC AND ANAEROBIC Blood Culture adequate volume   Culture NO GROWTH 5 DAYS  Final   Report Status 01/06/2017 FINAL  Final  Culture, blood (routine x 2)     Status: None  Collection Time: 01/01/17  6:48 PM  Result Value Ref Range Status   Specimen Description BLOOD LEFT HAND  Final   Special Requests   Final    BOTTLES DRAWN AEROBIC AND ANAEROBIC Blood Culture adequate volume   Culture NO GROWTH 5 DAYS  Final   Report Status 01/06/2017 FINAL  Final  MRSA PCR Screening     Status: None   Collection Time: 01/01/17 10:16 PM  Result Value Ref Range Status   MRSA by PCR NEGATIVE NEGATIVE Final    Comment:        The GeneXpert MRSA Assay (FDA approved for NASAL specimens only), is one component of a comprehensive MRSA colonization surveillance program. It is not intended to diagnose MRSA infection nor to guide or monitor treatment for MRSA infections.     Coagulation Studies: No results for input(s): LABPROT, INR in the last 72 hours.  Urinalysis: No results for input(s): COLORURINE, LABSPEC, PHURINE, GLUCOSEU, HGBUR, BILIRUBINUR, KETONESUR, PROTEINUR, UROBILINOGEN, NITRITE, LEUKOCYTESUR in the last 72 hours.  Invalid input(s): APPERANCEUR    Imaging: No results found.   Medications:   . sodium chloride    . sodium chloride     . epoetin (EPOGEN/PROCRIT) injection  10,000 Units  Intravenous Q M,W,F-HD  . feeding supplement (NEPRO CARB STEADY)  237 mL Oral BID BM  . levothyroxine  25 mcg Oral QAC breakfast  . metoprolol succinate  25 mg Oral Daily  . multivitamin  1 tablet Oral QHS  . multivitamin with minerals  1 tablet Oral Once per day on Mon Thu  . ramipril  2.5 mg Oral QHS  . sertraline  100 mg Oral QHS  . vitamin C  500 mg Oral BID   sodium chloride, sodium chloride, acetaminophen **OR** acetaminophen, albuterol, alteplase, diphenoxylate-atropine, heparin, lidocaine (PF), lidocaine-prilocaine, nitroGLYCERIN, ondansetron **OR** ondansetron (ZOFRAN) IV, pentafluoroprop-tetrafluoroeth, polyethylene glycol, traMADol  Assessment/ Plan:  83 y.o. Caucasian female with end-stage renal disease, left femur fracture, anemia, hypertension, coronary disease, atherosclerosis, cardiomyopathy, history of peritoneal dialysis, s/p IVC filter placement for failed anticoagulation, pulmonary embolsim, L IJ permcath placed for infiltration of dialysis access.   [facility]/MWF/[place] [facility]/195 minutes / 43.5 kg  1.  End-stage renal disease  2.  Anemia of chronic kidney disease 3.  Secondary hyperparathyroidism 4.  Right lower lobe subsegmental pulmonary embolus 5.  Syncope 6.  Hypokalemia  Plan:  Patient to have hemodialysis performed today.  Orders have been prepared.  We will use her left internal jugular PermCath now.  We will plan for her right upper extremity AV fistula to rest.  In addition she had IVC filter placed as she failed anticoagulation for her pulmonary embolism.  Otherwise disposition per hospitalist.   LOS: 6 Ananiah Maciolek, [facility] 12/14/201811:42 AM  [facility], [place]

## 2017-01-08 NOTE — Discharge Summary (Signed)
[facility] [name]    MR#:  [id number]  DATE OF BIRTH:  01/30/1926  DATE OF ADMISSION:  01/01/2017 ADMITTING PHYSICIAN: [name], MD  DATE OF DISCHARGE: 01/08/2017  PRIMARY CARE PHYSICIAN: Kirk Ruths, MD    ADMISSION DIAGNOSIS:  Chest wall pain [R07.89] Pleural effusion on right [J90] DVT (deep vein thrombosis) in pregnancy ([place]) [O22.30, I82.409] Fall, initial encounter [W19.XXXA] Syncope, unspecified syncope type [R55] Other pulmonary embolism without acute cor pulmonale, unspecified chronicity ([place]) [I26.99]  DISCHARGE DIAGNOSIS:  Active Problems:   Goals of care, counseling/discussion   Syncope   Pulmonary embolism (HCC)   ESRD on hemodialysis (HCC)   Pleural effusion on right   Palliative care by specialist   SECONDARY DIAGNOSIS:   Past Medical History:  Diagnosis Date  . Arthritis   . CAD (coronary artery disease)   . Cancer ([place])    skin  . Cardiomyopathy ([place])   . Depression   . IBS (irritable bowel syndrome) 2010  . Kidney failure July 2012   Hemodialysis 3xweek  . Kidney failure   . Meniere disease   . Meniere's disease   . Myocardial infarction ([place])   . OSA (obstructive sleep apnea)    CPAP  . Peritoneal dialysis status ([place])   . Presence of permanent cardiac pacemaker   . Renal insufficiency     HOSPITAL COURSE:   1.  Right lower lobe pulmonary embolism with syncope.  We tried anticoagulation twice with heparin drip and then with Eliquis.  The patient had bleeding from skin tears and her dialysis access with a heparin drip.  With the Eliquis, she infiltrated her dialysis access.  We are not treating pulmonary embolism at this time.  The patient had an IVC filter by vascular surgery. 2.  Large right pleural effusion.  Thoracentesis was done with good response with respiratory status. 3.  Continued chest pain.  Pain when pressing over her sternum.  This is likely  musculoskeletal in nature.  This has responded well to tramadol.  No further cardiac workup. 4.  Dialysis access complication.  Right arm fistula infiltrated after blood thinner given.  PermCath placed yesterday.  Dialysis will be via PermCath until right arm able to be used. 5.  End-stage renal disease on hemodialysis.  Patient will have dialysis today prior to disposition back to facility. 6.  Hypothyroidism unspecified on levothyroxine 7.  History of CAD on metoprolol 8.  Severe cardiomyopathy and chronic systolic congestive heart failure.  Continue Altace at night.  Continue metoprolol.  Dialysis to manage fluid.  Not a candidate for Spironolactone because she is a dialysis patient. 9.  Weakness.  Physical therapy at nursing home 10.  Confusion has resolved.  Needed to give Seroquel 1 night to get her sleeping.  But this will be needed back in her usual facility. 11.  Palliative care to follow at facility  DISCHARGE CONDITIONS:   Fair  CONSULTS OBTAINED:  Treatment Team:  Cammie Sickle, MD Isaias Cowman, MD Murlean Iba, MD Schnier, Dolores Lory, MD  DRUG ALLERGIES:   Allergies  Allergen Reactions  . Statins Other (See Comments)    Muscle weakness Muscle weakness severe  . Codeine Sulfate Nausea And Vomiting  . Codeine Other (See Comments)    GI UPSET  . Effexor [Venlafaxine] Nausea Only  . Ezetimibe-Simvastatin Other (See Comments)    Muscle weakness    DISCHARGE MEDICATIONS:   Allergies as of 01/08/2017  Reactions   Statins Other (See Comments)   Muscle weakness Muscle weakness severe   Codeine Sulfate Nausea And Vomiting   Codeine Other (See Comments)   GI UPSET   Effexor [venlafaxine] Nausea Only   Ezetimibe-simvastatin Other (See Comments)   Muscle weakness      Medication List    STOP taking these medications   diazepam 5 MG tablet Commonly known as:  VALIUM   meloxicam 7.5 MG tablet Commonly known as:  MOBIC   metoprolol  tartrate 25 MG tablet Commonly known as:  LOPRESSOR   mupirocin ointment 2 % Commonly known as:  BACTROBAN     TAKE these medications   acetaminophen 325 MG tablet Commonly known as:  TYLENOL Take 2 tablets (650 mg total) by mouth every 4 (four) hours as needed for mild pain or moderate pain.   ascorbic acid 500 MG tablet Commonly known as:  VITAMIN C Take 1 tablet (500 mg total) by mouth 2 (two) times daily.   diphenoxylate-atropine 2.5-0.025 MG tablet Commonly known as:  LOMOTIL TAKE 1 TABLET BY MOUTH FOUR TIMES DAILY AS NEEDED FOR DIARRHEA OR LOOSE STOOLS What changed:  See the new instructions.   feeding supplement (NEPRO CARB STEADY) Liqd Take 237 mLs by mouth 2 (two) times daily between meals.   folic acid-vitamin b complex-vitamin c-selenium-zinc 3 MG Tabs tablet Take 1 tablet by mouth daily.   levothyroxine 25 MCG tablet Commonly known as:  SYNTHROID, LEVOTHROID Take 1 tablet (25 mcg total) by mouth daily. What changed:  additional instructions   lidocaine-prilocaine cream Commonly known as:  EMLA Apply 1 application topically every Monday, Wednesday, and Friday with hemodialysis. (1100) apply to right arm dialysis access pre-dialysis wrap with saran wrap after applying.   metoCLOPramide 5 MG tablet Commonly known as:  REGLAN Take 1 tablet (5 mg total) by mouth every 8 (eight) hours as needed (vertigo).   metoprolol succinate 25 MG 24 hr tablet Commonly known as:  TOPROL XL Take 1 tablet (25 mg total) by mouth daily.   multivitamin Tabs tablet Take 1 tablet by mouth at bedtime.   polyethylene glycol packet Commonly known as:  MIRALAX / GLYCOLAX Take 17 g by mouth daily as needed for mild constipation.   ramipril 2.5 MG capsule Commonly known as:  ALTACE Take 1 capsule (2.5 mg total) by mouth at bedtime.   sertraline 50 MG tablet Commonly known as:  ZOLOFT TAKE 3 TABLETS(150 MG) BY MOUTH DAILY   traMADol 50 MG tablet Commonly known as:  ULTRAM Take  1 tablet (50 mg total) by mouth every 12 (twelve) hours as needed for moderate pain or severe pain. What changed:  reasons to take this   Vitamin D (Ergocalciferol) 50000 units Caps capsule Commonly known as:  DRISDOL Take 50,000 Units by mouth every Monday. (0900)        DISCHARGE INSTRUCTIONS:   Follow-up with dialysis Monday Wednesday Friday Follow-up with palliative care team at facility Follow-up with Dr. at facility 1 day  If you experience worsening of your admission symptoms, develop shortness of breath, life threatening emergency, suicidal or homicidal thoughts you must seek medical attention immediately by calling 911 or calling your MD immediately  if symptoms less severe.  You Must read complete instructions/literature along with all the possible adverse reactions/side effects for all the Medicines you take and that have been prescribed to you. Take any new Medicines after you have completely understood and accept all the possible adverse reactions/side effects.  Please note  You were cared for by a hospitalist during your hospital stay. If you have any questions about your discharge medications or the care you received while you were in the hospital after you are discharged, you can call the unit and asked to speak with the hospitalist on call if the hospitalist that took care of you is not available. Once you are discharged, your primary care physician will handle any further medical issues. Please note that NO REFILLS for any discharge medications will be authorized once you are discharged, as it is imperative that you return to your primary care physician (or establish a relationship with a primary care physician if you do not have one) for your aftercare needs so that they can reassess your need for medications and monitor your lab values.    Today   CHIEF COMPLAINT:   Chief Complaint  Patient presents with  . Fall    HISTORY OF PRESENT ILLNESS:  Sharon [name]   is a 83 y.o. female  found to have pulmonary embolism and syncope and right pleural effusion   VITAL SIGNS:  Blood pressure (!) 124/40, pulse 63, temperature 98.1 F (36.7 C), temperature source Oral, resp. rate 12, height 5\' 4"  (1.626 m), weight 43.5 kg (96 lb), SpO2 97 %.    PHYSICAL EXAMINATION:  GENERAL:  83 y.o.-year-old patient lying in the bed with no acute distress.  EYES: Pupils equal, round, reactive to light and accommodation. No scleral icterus. Extraocular muscles intact.  HEENT: Head atraumatic, normocephalic. Oropharynx and nasopharynx clear.  NECK:  Supple, no jugular venous distention. No thyroid enlargement, no tenderness.  LUNGS: Decreased breath sounds bilaterally, no wheezing, rales,rhonchi or crepitation. No use of accessory muscles of respiration.  CARDIOVASCULAR: S1, S2 normal. No murmurs, rubs, or gallops.  ABDOMEN: Soft, non-tender, non-distended. Bowel sounds present. No organomegaly or mass.  EXTREMITIES: No pedal edema, cyanosis, or clubbing.  NEUROLOGIC: Cranial nerves II through XII are intact. Muscle strength 5/5 in all extremities. Sensation intact. Gait not checked.  PSYCHIATRIC: The patient is alert and oriented x 3.  SKIN: No obvious rash, lesion, or ulcer.  Bruising upper extremities and left chest.  Skin tear left elbow  DATA REVIEW:   CBC Recent Labs  Lab 01/03/17 0554  01/06/17 0321  WBC 5.9  --   --   HGB 9.0*   < > 9.5*  HCT 27.0*  --   --   PLT 200  --   --    < > = values in this interval not displayed.    Chemistries  Recent Labs  Lab 01/02/17 0435  NA 139  K 3.1*  CL 100*  CO2 28  GLUCOSE 117*  BUN 31*  CREATININE 1.96*  CALCIUM 8.7*    Cardiac Enzymes Recent Labs  Lab 01/02/17 0435  TROPONINI 0.12*    Microbiology Results  Results for orders placed or performed during the hospital encounter of 01/01/17  Culture, blood (routine x 2)     Status: None   Collection Time: 01/01/17  6:39 PM  Result Value Ref Range  Status   Specimen Description BLOOD LEFT ANTECUBITAL  Final   Special Requests   Final    BOTTLES DRAWN AEROBIC AND ANAEROBIC Blood Culture adequate volume   Culture NO GROWTH 5 DAYS  Final   Report Status 01/06/2017 FINAL  Final  Culture, blood (routine x 2)     Status: None   Collection Time: 01/01/17  6:48 PM  Result Value Ref  Range Status   Specimen Description BLOOD LEFT HAND  Final   Special Requests   Final    BOTTLES DRAWN AEROBIC AND ANAEROBIC Blood Culture adequate volume   Culture NO GROWTH 5 DAYS  Final   Report Status 01/06/2017 FINAL  Final  MRSA PCR Screening     Status: None   Collection Time: 01/01/17 10:16 PM  Result Value Ref Range Status   MRSA by PCR NEGATIVE NEGATIVE Final    Comment:        The GeneXpert MRSA Assay (FDA approved for NASAL specimens only), is one component of a comprehensive MRSA colonization surveillance program. It is not intended to diagnose MRSA infection nor to guide or monitor treatment for MRSA infections.      Management plans discussed with the patient, family and they are in agreement.  CODE STATUS:     Code Status Orders  (From admission, onward)        Start     Ordered   01/01/17 2025  Full code  Continuous     01/01/17 2027    Code Status History    Date Active Date Inactive Code Status Order ID Comments User Context   05/12/2016 08:09 05/14/2016 14:39 DNR [id number]  Max Sane, MD Inpatient   05/10/2016 05:06 05/12/2016 08:09 Full Code [id number]  Saundra Shelling, MD Inpatient   12/15/2015 15:15 12/19/2015 01:59 Full Code [id number]  Oletta Cohn, DO Inpatient   12/14/2015 16:13 12/15/2015 15:15 Full Code [id number]  Bettey Costa, MD Inpatient      TOTAL TIME TAKING CARE OF THIS PATIENT: 35 minutes.    Loletha Grayer M.D on 01/08/2017 at 8:37 AM  Between 7am to 6pm - Pager - [phone]  After 6pm go to [email or web address] - password [email or web address]  [facility] Office  [phone]  CC: Primary care  physician; Kirk Ruths, MD

## 2017-01-08 NOTE — Progress Notes (Signed)
PRE Assessment

## 2017-01-11 DIAGNOSIS — Z992 Dependence on renal dialysis: Secondary | ICD-10-CM | POA: Diagnosis not present

## 2017-01-11 DIAGNOSIS — N2581 Secondary hyperparathyroidism of renal origin: Secondary | ICD-10-CM | POA: Diagnosis not present

## 2017-01-11 DIAGNOSIS — N186 End stage renal disease: Secondary | ICD-10-CM | POA: Diagnosis not present

## 2017-01-11 DIAGNOSIS — D509 Iron deficiency anemia, unspecified: Secondary | ICD-10-CM | POA: Diagnosis not present

## 2017-01-12 ENCOUNTER — Telehealth: Payer: Self-pay

## 2017-01-12 DIAGNOSIS — F325 Major depressive disorder, single episode, in full remission: Secondary | ICD-10-CM | POA: Diagnosis not present

## 2017-01-12 DIAGNOSIS — I5022 Chronic systolic (congestive) heart failure: Secondary | ICD-10-CM | POA: Diagnosis not present

## 2017-01-12 DIAGNOSIS — I2782 Chronic pulmonary embolism: Secondary | ICD-10-CM | POA: Diagnosis not present

## 2017-01-12 DIAGNOSIS — E441 Mild protein-calorie malnutrition: Secondary | ICD-10-CM | POA: Diagnosis not present

## 2017-01-12 DIAGNOSIS — S72002D Fracture of unspecified part of neck of left femur, subsequent encounter for closed fracture with routine healing: Secondary | ICD-10-CM | POA: Diagnosis not present

## 2017-01-12 NOTE — Telephone Encounter (Signed)
Spoke with Ms. Welshans brother who is her POA and made an appointment for 01/28/2017 at 11:20 AM.

## 2017-01-12 NOTE — Telephone Encounter (Signed)
-----   Message from Alisa Graff, [place] sent at 01/08/2017  8:54 PM EST ----- Regarding: Please call to schedule appointment Contact: [phone] D/c'd on 12/14 Wieting referred

## 2017-01-13 DIAGNOSIS — N2581 Secondary hyperparathyroidism of renal origin: Secondary | ICD-10-CM | POA: Diagnosis not present

## 2017-01-13 DIAGNOSIS — N186 End stage renal disease: Secondary | ICD-10-CM | POA: Diagnosis not present

## 2017-01-13 DIAGNOSIS — Z992 Dependence on renal dialysis: Secondary | ICD-10-CM | POA: Diagnosis not present

## 2017-01-13 DIAGNOSIS — D509 Iron deficiency anemia, unspecified: Secondary | ICD-10-CM | POA: Diagnosis not present

## 2017-01-15 DIAGNOSIS — N2581 Secondary hyperparathyroidism of renal origin: Secondary | ICD-10-CM | POA: Diagnosis not present

## 2017-01-15 DIAGNOSIS — Z992 Dependence on renal dialysis: Secondary | ICD-10-CM | POA: Diagnosis not present

## 2017-01-15 DIAGNOSIS — N186 End stage renal disease: Secondary | ICD-10-CM | POA: Diagnosis not present

## 2017-01-15 DIAGNOSIS — D509 Iron deficiency anemia, unspecified: Secondary | ICD-10-CM | POA: Diagnosis not present

## 2017-01-17 DIAGNOSIS — D509 Iron deficiency anemia, unspecified: Secondary | ICD-10-CM | POA: Diagnosis not present

## 2017-01-17 DIAGNOSIS — Z992 Dependence on renal dialysis: Secondary | ICD-10-CM | POA: Diagnosis not present

## 2017-01-17 DIAGNOSIS — N186 End stage renal disease: Secondary | ICD-10-CM | POA: Diagnosis not present

## 2017-01-17 DIAGNOSIS — N2581 Secondary hyperparathyroidism of renal origin: Secondary | ICD-10-CM | POA: Diagnosis not present

## 2017-01-20 DIAGNOSIS — I2699 Other pulmonary embolism without acute cor pulmonale: Secondary | ICD-10-CM | POA: Diagnosis not present

## 2017-01-20 DIAGNOSIS — N2581 Secondary hyperparathyroidism of renal origin: Secondary | ICD-10-CM | POA: Diagnosis not present

## 2017-01-20 DIAGNOSIS — D509 Iron deficiency anemia, unspecified: Secondary | ICD-10-CM | POA: Diagnosis not present

## 2017-01-20 DIAGNOSIS — N186 End stage renal disease: Secondary | ICD-10-CM | POA: Diagnosis not present

## 2017-01-20 DIAGNOSIS — Z992 Dependence on renal dialysis: Secondary | ICD-10-CM | POA: Diagnosis not present

## 2017-01-22 DIAGNOSIS — N186 End stage renal disease: Secondary | ICD-10-CM | POA: Diagnosis not present

## 2017-01-22 DIAGNOSIS — N2581 Secondary hyperparathyroidism of renal origin: Secondary | ICD-10-CM | POA: Diagnosis not present

## 2017-01-22 DIAGNOSIS — Z992 Dependence on renal dialysis: Secondary | ICD-10-CM | POA: Diagnosis not present

## 2017-01-22 DIAGNOSIS — D509 Iron deficiency anemia, unspecified: Secondary | ICD-10-CM | POA: Diagnosis not present

## 2017-01-25 DIAGNOSIS — N186 End stage renal disease: Secondary | ICD-10-CM | POA: Diagnosis not present

## 2017-01-25 DIAGNOSIS — D509 Iron deficiency anemia, unspecified: Secondary | ICD-10-CM | POA: Diagnosis not present

## 2017-01-25 DIAGNOSIS — N2581 Secondary hyperparathyroidism of renal origin: Secondary | ICD-10-CM | POA: Diagnosis not present

## 2017-01-25 DIAGNOSIS — Z992 Dependence on renal dialysis: Secondary | ICD-10-CM | POA: Diagnosis not present

## 2017-01-26 DIAGNOSIS — Z95828 Presence of other vascular implants and grafts: Secondary | ICD-10-CM

## 2017-01-26 HISTORY — DX: Presence of other vascular implants and grafts: Z95.828

## 2017-01-26 HISTORY — PX: IVC FILTER INSERTION: CATH118245

## 2017-01-28 ENCOUNTER — Encounter: Payer: Self-pay | Admitting: Family

## 2017-01-28 ENCOUNTER — Ambulatory Visit: Payer: No Typology Code available for payment source | Attending: Family | Admitting: Family

## 2017-01-28 VITALS — BP 130/51 | HR 59 | Resp 18 | Ht 64.0 in | Wt 95.5 lb

## 2017-01-28 DIAGNOSIS — I132 Hypertensive heart and chronic kidney disease with heart failure and with stage 5 chronic kidney disease, or end stage renal disease: Secondary | ICD-10-CM | POA: Diagnosis not present

## 2017-01-28 DIAGNOSIS — K589 Irritable bowel syndrome without diarrhea: Secondary | ICD-10-CM | POA: Insufficient documentation

## 2017-01-28 DIAGNOSIS — Z87891 Personal history of nicotine dependence: Secondary | ICD-10-CM | POA: Diagnosis not present

## 2017-01-28 DIAGNOSIS — I252 Old myocardial infarction: Secondary | ICD-10-CM | POA: Insufficient documentation

## 2017-01-28 DIAGNOSIS — I2782 Chronic pulmonary embolism: Secondary | ICD-10-CM

## 2017-01-28 DIAGNOSIS — F329 Major depressive disorder, single episode, unspecified: Secondary | ICD-10-CM | POA: Insufficient documentation

## 2017-01-28 DIAGNOSIS — G8929 Other chronic pain: Secondary | ICD-10-CM | POA: Diagnosis not present

## 2017-01-28 DIAGNOSIS — I509 Heart failure, unspecified: Secondary | ICD-10-CM | POA: Diagnosis present

## 2017-01-28 DIAGNOSIS — G4733 Obstructive sleep apnea (adult) (pediatric): Secondary | ICD-10-CM | POA: Diagnosis not present

## 2017-01-28 DIAGNOSIS — I2699 Other pulmonary embolism without acute cor pulmonale: Secondary | ICD-10-CM | POA: Diagnosis not present

## 2017-01-28 DIAGNOSIS — N186 End stage renal disease: Secondary | ICD-10-CM | POA: Diagnosis not present

## 2017-01-28 DIAGNOSIS — Z79899 Other long term (current) drug therapy: Secondary | ICD-10-CM | POA: Insufficient documentation

## 2017-01-28 DIAGNOSIS — I10 Essential (primary) hypertension: Secondary | ICD-10-CM

## 2017-01-28 DIAGNOSIS — I429 Cardiomyopathy, unspecified: Secondary | ICD-10-CM | POA: Insufficient documentation

## 2017-01-28 DIAGNOSIS — Z950 Presence of cardiac pacemaker: Secondary | ICD-10-CM | POA: Diagnosis not present

## 2017-01-28 DIAGNOSIS — Z992 Dependence on renal dialysis: Secondary | ICD-10-CM | POA: Insufficient documentation

## 2017-01-28 DIAGNOSIS — H8109 Meniere's disease, unspecified ear: Secondary | ICD-10-CM | POA: Diagnosis not present

## 2017-01-28 DIAGNOSIS — I2510 Atherosclerotic heart disease of native coronary artery without angina pectoris: Secondary | ICD-10-CM | POA: Insufficient documentation

## 2017-01-28 DIAGNOSIS — I5022 Chronic systolic (congestive) heart failure: Secondary | ICD-10-CM

## 2017-01-28 NOTE — Patient Instructions (Signed)
Continue weighing daily and call for an overnight weight gain of > 2 pounds or a weekly weight gain of >5 pounds. 

## 2017-01-28 NOTE — Progress Notes (Signed)
Patient ID: Sharon [name], female    DOB: [birth year], 84 y.o.   MRN: [id number]  HPI  Ms Ellingsen is a 84 y/o female with a history of CAD, CKD (dialysis), MI, obstructive sleep apnea, depression, anemia, remote tobacco use and chronic heart failure.   Echo report from 01/02/17 reviewed and showed an EF of 20-25% along with moderate MR, mod/severe TR and severely elevated PA pressure of 77 mm Hg.   Admitted 01/01/17 due to right lower lobe PE with syncope. IVC filter placed. Tried on heparin drip and then with apixaban and patient had bleeding from skin tears and her dialysis access. Anticoagulation was stopped. Cardiology consult was obtained. Thoracentesis was done due to pleural effusion. Palliative care consult also obtained. Was discharged to facility after 7 days. Was in the ED 10/11/16 after a fall. Had to have a laceration on her forehead sutured and she was released that day.   She presents today for her initial visit with a chief complaint of moderate fatigue upon minimal exertion. She says this has been chronic in nature having been present for several years with varying levels of severity. She has associated cough, shortness of breath, chronic chest pain, easy bruising and difficulty sleeping along with this. She denies any edema, palpitations, dizziness or weight gain.   Past Medical History:  Diagnosis Date  . Arthritis   . CAD (coronary artery disease)   . Cancer ([place])    skin  . Cardiomyopathy ([place])   . CHF (congestive heart failure) ([place])   . Depression   . IBS (irritable bowel syndrome) 2010  . Kidney failure July 2012   Hemodialysis 3xweek  . Kidney failure   . Meniere disease   . Meniere's disease   . Myocardial infarction ([place])   . OSA (obstructive sleep apnea)    CPAP  . Peritoneal dialysis status ([place])   . Presence of permanent cardiac pacemaker   . Renal insufficiency    Past Surgical History:  Procedure Laterality Date  . [place]  .  ANUS SURGERY  2010  . AV FISTULA PLACEMENT Right 04/15/2016   Procedure: ARTERIOVENOUS (AV) FISTULA CREATION ( RADIOCEPHALIC ) STAGE 2;  Surgeon: Algernon Huxley, MD;  Location: [facility] ORS;  Service: Vascular;  Laterality: Right;  . CATARACT EXTRACTION  2006, 2011  . CHOLECYSTECTOMY  01/2008  . DIALYSIS/PERMA CATHETER INSERTION N/A 01/07/2017   Procedure: DIALYSIS/PERMA CATHETER INSERTION With an IVC filter placement;  Surgeon: Algernon Huxley, MD;  Location: [facility];  Service: Cardiovascular;  Laterality: N/A;  . DIALYSIS/PERMA CATHETER REMOVAL N/A 08/20/2016   Procedure: Dialysis/Perma Catheter Removal;  Surgeon: Algernon Huxley, MD;  Location: [facility];  Service: Cardiovascular;  Laterality: N/A;  . EYE SURGERY     cataracts bilateral  . FEMUR IM NAIL Left 12/15/2015   Procedure: INTRAMEDULLARY (IM) RETROGRADE FEMORAL NAILING;  Surgeon: Oletta Cohn, DO;  Location: [facility];  Service: Orthopedics;  Laterality: Left;  . HIP PINNING,CANNULATED Left 05/10/2016   Procedure: CANNULATED HIP PINNING;  Surgeon: Earnestine Leys, MD;  Location: [facility];  Service: Orthopedics;  Laterality: Left;  . INSERT / REPLACE / REMOVE PACEMAKER    . INSERTION OF DIALYSIS CATHETER  07/2010  . JOINT REPLACEMENT     left knee  . MINOR REMOVAL OF PERITONEAL DIALYSIS CATHETER  04/15/2016   Procedure: MINOR REMOVAL OF PERITONEAL DIALYSIS CATHETER;  Surgeon: Algernon Huxley, MD;  Location: [facility];  Service: Vascular;;  .  PACEMAKER INSERTION  2006  . PERIPHERAL VASCULAR CATHETERIZATION N/A 12/18/2015   Procedure: Dialysis/Perma Catheter Insertion;  Surgeon: Katha Cabal, MD;  Location: [facility];  Service: Cardiovascular;  Laterality: N/A;  . TOTAL KNEE ARTHROPLASTY  2008   LEFT/Dr [place]   Family History  Problem Relation Age of Onset  . Cancer Mother        ? ovarian - sarcoma  . Cancer Father        Skin cancer  . Diabetes Sister   . COPD Sister   . Depression Sister   . Cancer Sister         Lung - 63 yrs old   Social History   Tobacco Use  . Smoking status: Former Smoker    Last attempt to quit: 03/31/1948    Years since quitting: 68.8  . Smokeless tobacco: Never Used  Substance Use Topics  . Alcohol use: No   Allergies  Allergen Reactions  . Statins Other (See Comments)    Muscle weakness Muscle weakness severe  . Codeine Sulfate Nausea And Vomiting  . Codeine Other (See Comments)    GI UPSET  . Effexor [Venlafaxine] Nausea Only  . Ezetimibe-Simvastatin Other (See Comments)    Muscle weakness   Prior to Admission medications   Medication Sig Start Date End Date Taking? Authorizing Provider  acetaminophen (TYLENOL) 325 MG tablet Take 2 tablets (650 mg total) by mouth every 4 (four) hours as needed for mild pain or moderate pain. 05/14/16  Yes Wieting, Richard, MD  diphenoxylate-atropine (LOMOTIL) 2.5-0.025 MG tablet TAKE 1 TABLET BY MOUTH FOUR TIMES DAILY AS NEEDED FOR DIARRHEA OR LOOSE STOOLS Patient taking differently: TAKE 1 TABLET BY MOUTH every 6 hours AS NEEDED FOR DIARRHEA OR LOOSE STOOLS 08/30/15  Yes [name], MD  folic acid-vitamin b complex-vitamin c-selenium-zinc (DIALYVITE) 3 MG TABS tablet Take 1 tablet by mouth daily.   Yes [provider]  levothyroxine (SYNTHROID, LEVOTHROID) 25 MCG tablet Take 1 tablet (25 mcg total) by mouth daily. Patient taking differently: Take 25 mcg by mouth daily. (0800) 10/25/15  Yes [name], MD  lidocaine-prilocaine (EMLA) cream Apply 1 application topically every Monday, Wednesday, and Friday with hemodialysis. (1100) apply to right arm dialysis access pre-dialysis wrap with saran wrap after applying.   Yes [provider]  metoCLOPramide (REGLAN) 5 MG tablet Take 1 tablet (5 mg total) by mouth every 8 (eight) hours as needed (vertigo). 05/30/16  Yes Menshew, Dannielle Karvonen, PA-C  metoprolol succinate (TOPROL XL) 25 MG 24 hr tablet Take 1 tablet (25 mg total) by mouth daily. 01/08/17  Yes  Wieting, Richard, MD  Nutritional Supplements (FEEDING SUPPLEMENT, NEPRO CARB STEADY,) LIQD Take 237 mLs by mouth 2 (two) times daily between meals. 01/08/17  Yes Wieting, Richard, MD  polyethylene glycol (MIRALAX / GLYCOLAX) packet Take 17 g by mouth daily as needed for mild constipation. 01/08/17  Yes Wieting, Richard, MD  ramipril (ALTACE) 2.5 MG capsule Take 1 capsule (2.5 mg total) by mouth at bedtime. 01/08/17  Yes Wieting, Richard, MD  sertraline (ZOLOFT) 50 MG tablet TAKE 3 TABLETS(150 MG) BY MOUTH DAILY 08/30/15  Yes [name], MD  Vitamin D, Ergocalciferol, (DRISDOL) 50000 units CAPS capsule Take 50,000 Units by mouth every Monday. (0900) 04/17/15  Yes [provider]  traMADol (ULTRAM) 50 MG tablet Take 1 tablet (50 mg total) by mouth every 12 (twelve) hours as needed for moderate pain or severe pain. Patient not taking: Reported  on 01/28/2017 01/08/17   Loletha Grayer, MD    Review of Systems  Constitutional: Positive for fatigue. Negative for appetite change.  HENT: Positive for hearing loss. Negative for congestion and sore throat.   Eyes: Negative.   Respiratory: Positive for cough and shortness of breath. Negative for chest tightness.   Cardiovascular: Positive for chest pain (chronic). Negative for palpitations and leg swelling.  Gastrointestinal: Negative for abdominal distention and abdominal pain.  Endocrine: Negative.   Musculoskeletal: Positive for arthralgias (tailbone) and back pain (at times).  Skin: Negative.   Allergic/Immunologic: Negative.   Neurological: Negative for dizziness and light-headedness.  Hematological: Negative for adenopathy. Bruises/bleeds easily.  Psychiatric/Behavioral: Positive for sleep disturbance (sleeping "all the time"). Negative for dysphoric mood. The patient is not nervous/anxious.    Vitals:   01/28/17 1145  BP: (!) 130/51  Pulse: (!) 59  Resp: 18  SpO2: 95%  Weight: 95 lb 8 oz (43.3 kg)  Height: 5\' 4"  (1.626 m)    Wt Readings from Last 3 Encounters:  01/28/17 95 lb 8 oz (43.3 kg)  01/08/17 95 lb 14.4 oz (43.5 kg)  12/29/16 98 lb (44.5 kg)   Lab Results  Component Value Date   CREATININE 1.96 (H) 01/02/2017   CREATININE 1.47 (H) 01/01/2017   CREATININE 2.48 (H) 10/11/2016   Physical Exam  Constitutional: She is oriented to person, place, and time. She appears well-developed and well-nourished.  HENT:  Head: Normocephalic and atraumatic.  Neck: Normal range of motion. Neck supple. No JVD present.  Cardiovascular: Regular rhythm. Bradycardia present.  Pulmonary/Chest: Effort normal. No respiratory distress. She has no wheezes. She has no rales.  Abdominal: Soft. She exhibits no distension. There is no tenderness.  Musculoskeletal: She exhibits no edema or tenderness.  Neurological: She is alert and oriented to person, place, and time.  Skin: Skin is warm and dry.  Psychiatric: She has a normal mood and affect. Her behavior is normal. Thought content normal.  Nursing note and vitals reviewed.  Assessment & Plan:  1: Chronic heart failure with reduced ejection fraction- - NYHA class III - euvolemic today - being weighed at dialysis; order written for [facility] to begin weighing daily so that they can call for an overnight weight gain of >2 pounds or a weekly weight gain of >5 pounds - adds salt to grits and she was encouraged to not add any salt to any food; discussed that most likely, the foods that she's currently eating from the facility have enough salt in them without her adding any - unsure of fluid intake and discussed the importance of keeping fluid intake to between 40-60 ounces daily - sees cardiologist Nehemiah Massed) 02/02/17 - has pacemaker present - due to bradycardia, doubt her metoprolol succinate dose can be increased.   2: HTN- - BP looks good today - BMP from 01/02/17 reviewed and showed sodium 139, potassium 3.1 and GFR 22 - seeing PCP at the facility  3: Pulmonary  embolism- - had extensive bleeding when on anticoagulants so they were stopped - IVC filter in place  4: ESRD-  - receiving dialysis on M, W, F - could consider changing her ramipril to entresto in the future  Facility medication list was reviewed.  Return in 1 month or sooner for any questions/problems before then.

## 2017-01-30 ENCOUNTER — Encounter: Payer: Self-pay | Admitting: Family

## 2017-02-03 ENCOUNTER — Inpatient Hospital Stay
Admission: EM | Admit: 2017-02-03 | Discharge: 2017-02-04 | DRG: 562 | Disposition: A | Discharge status: Skilled Nursing Facility | Payer: Medicare Other | Attending: Internal Medicine | Admitting: Internal Medicine

## 2017-02-03 ENCOUNTER — Other Ambulatory Visit: Payer: Self-pay

## 2017-02-03 ENCOUNTER — Emergency Department: Payer: Medicare Other

## 2017-02-03 DIAGNOSIS — Z66 Do not resuscitate: Secondary | ICD-10-CM | POA: Diagnosis not present

## 2017-02-03 DIAGNOSIS — Z992 Dependence on renal dialysis: Secondary | ICD-10-CM | POA: Diagnosis not present

## 2017-02-03 DIAGNOSIS — H8109 Meniere's disease, unspecified ear: Secondary | ICD-10-CM | POA: Diagnosis not present

## 2017-02-03 DIAGNOSIS — R296 Repeated falls: Secondary | ICD-10-CM | POA: Diagnosis present

## 2017-02-03 DIAGNOSIS — F329 Major depressive disorder, single episode, unspecified: Secondary | ICD-10-CM | POA: Diagnosis not present

## 2017-02-03 DIAGNOSIS — Z96652 Presence of left artificial knee joint: Secondary | ICD-10-CM | POA: Diagnosis not present

## 2017-02-03 DIAGNOSIS — D631 Anemia in chronic kidney disease: Secondary | ICD-10-CM | POA: Diagnosis not present

## 2017-02-03 DIAGNOSIS — G4733 Obstructive sleep apnea (adult) (pediatric): Secondary | ICD-10-CM | POA: Diagnosis present

## 2017-02-03 DIAGNOSIS — Z681 Body mass index (BMI) 19 or less, adult: Secondary | ICD-10-CM | POA: Diagnosis not present

## 2017-02-03 DIAGNOSIS — Y92122 Bedroom in nursing home as the place of occurrence of the external cause: Secondary | ICD-10-CM | POA: Diagnosis not present

## 2017-02-03 DIAGNOSIS — I252 Old myocardial infarction: Secondary | ICD-10-CM

## 2017-02-03 DIAGNOSIS — K589 Irritable bowel syndrome without diarrhea: Secondary | ICD-10-CM | POA: Diagnosis not present

## 2017-02-03 DIAGNOSIS — I739 Peripheral vascular disease, unspecified: Secondary | ICD-10-CM | POA: Diagnosis present

## 2017-02-03 DIAGNOSIS — R627 Adult failure to thrive: Secondary | ICD-10-CM | POA: Diagnosis present

## 2017-02-03 DIAGNOSIS — S42031A Displaced fracture of lateral end of right clavicle, initial encounter for closed fracture: Secondary | ICD-10-CM | POA: Diagnosis present

## 2017-02-03 DIAGNOSIS — N186 End stage renal disease: Secondary | ICD-10-CM | POA: Diagnosis present

## 2017-02-03 DIAGNOSIS — N2581 Secondary hyperparathyroidism of renal origin: Secondary | ICD-10-CM | POA: Diagnosis present

## 2017-02-03 DIAGNOSIS — Z885 Allergy status to narcotic agent status: Secondary | ICD-10-CM

## 2017-02-03 DIAGNOSIS — Z888 Allergy status to other drugs, medicaments and biological substances status: Secondary | ICD-10-CM

## 2017-02-03 DIAGNOSIS — Z87891 Personal history of nicotine dependence: Secondary | ICD-10-CM | POA: Diagnosis not present

## 2017-02-03 DIAGNOSIS — I2510 Atherosclerotic heart disease of native coronary artery without angina pectoris: Secondary | ICD-10-CM | POA: Diagnosis not present

## 2017-02-03 DIAGNOSIS — W06XXXA Fall from bed, initial encounter: Secondary | ICD-10-CM | POA: Diagnosis not present

## 2017-02-03 DIAGNOSIS — Z950 Presence of cardiac pacemaker: Secondary | ICD-10-CM

## 2017-02-03 DIAGNOSIS — I429 Cardiomyopathy, unspecified: Secondary | ICD-10-CM | POA: Diagnosis present

## 2017-02-03 DIAGNOSIS — I132 Hypertensive heart and chronic kidney disease with heart failure and with stage 5 chronic kidney disease, or end stage renal disease: Secondary | ICD-10-CM | POA: Diagnosis not present

## 2017-02-03 DIAGNOSIS — I120 Hypertensive chronic kidney disease with stage 5 chronic kidney disease or end stage renal disease: Secondary | ICD-10-CM

## 2017-02-03 DIAGNOSIS — Z79899 Other long term (current) drug therapy: Secondary | ICD-10-CM

## 2017-02-03 DIAGNOSIS — I509 Heart failure, unspecified: Secondary | ICD-10-CM | POA: Diagnosis not present

## 2017-02-03 LAB — BASIC METABOLIC PANEL
Anion gap: 14 (ref 5–15)
BUN: 50 mg/dL — ABNORMAL HIGH (ref 6–20)
CALCIUM: 9 mg/dL (ref 8.9–10.3)
CO2: 22 mmol/L (ref 22–32)
CREATININE: 3.7 mg/dL — AB (ref 0.44–1.00)
Chloride: 98 mmol/L — ABNORMAL LOW (ref 101–111)
GFR, EST AFRICAN AMERICAN: 12 mL/min — AB (ref >60)
GFR, EST NON AFRICAN AMERICAN: 10 mL/min — AB (ref >60)
Glucose, Bld: 112 mg/dL — ABNORMAL HIGH (ref 65–99)
Potassium: 3.4 mmol/L — ABNORMAL LOW (ref 3.5–5.1)
Sodium: 134 mmol/L — ABNORMAL LOW (ref 135–145)

## 2017-02-03 LAB — CBC WITH DIFFERENTIAL/PLATELET
BASOS PCT: 1 %
Basophils Absolute: 0 10*3/uL (ref 0–0.1)
EOS ABS: 0.3 10*3/uL (ref 0–0.7)
Eosinophils Relative: 4 %
HEMATOCRIT: 27.6 % — AB (ref 35.0–47.0)
Hemoglobin: 9 g/dL — ABNORMAL LOW (ref 12.0–16.0)
Lymphocytes Relative: 17 %
Lymphs Abs: 1.3 10*3/uL (ref 1.0–3.6)
MCH: 32.5 pg (ref 26.0–34.0)
MCHC: 32.7 g/dL (ref 32.0–36.0)
MCV: 99.4 fL (ref 80.0–100.0)
MONOS PCT: 7 %
Monocytes Absolute: 0.5 10*3/uL (ref 0.2–0.9)
NEUTROS ABS: 5.5 10*3/uL (ref 1.4–6.5)
Neutrophils Relative %: 71 %
Platelets: 240 10*3/uL (ref 150–440)
RBC: 2.78 MIL/uL — ABNORMAL LOW (ref 3.80–5.20)
RDW: 15.7 % — ABNORMAL HIGH (ref 11.5–14.5)
WBC: 7.7 10*3/uL (ref 3.6–11.0)

## 2017-02-03 LAB — URINALYSIS, COMPLETE (UACMP) WITH MICROSCOPIC
BACTERIA UA: NONE SEEN
Bilirubin Urine: NEGATIVE
Glucose, UA: NEGATIVE mg/dL
HGB URINE DIPSTICK: NEGATIVE
Ketones, ur: NEGATIVE mg/dL
Leukocytes, UA: NEGATIVE
NITRITE: NEGATIVE
Protein, ur: 100 mg/dL — AB
SPECIFIC GRAVITY, URINE: 1.014 (ref 1.005–1.030)
pH: 5 (ref 5.0–8.0)

## 2017-02-03 LAB — MRSA PCR SCREENING: MRSA BY PCR: NEGATIVE

## 2017-02-03 LAB — PROTIME-INR
INR: 1.12
PROTHROMBIN TIME: 14.3 s (ref 11.4–15.2)

## 2017-02-03 MED ORDER — GLYCOPYRROLATE 0.2 MG/ML IJ SOLN
0.2000 mg | INTRAMUSCULAR | Status: DC | PRN
  Filled 2017-02-03: qty 1

## 2017-02-03 MED ORDER — POLYVINYL ALCOHOL 1.4 % OP SOLN: 1.0000 [drp] | Freq: Four times a day (QID) | OPHTHALMIC | Status: DC | PRN

## 2017-02-03 MED ORDER — MORPHINE SULFATE (PF) 2 MG/ML IV SOLN
2.0000 mg | INTRAVENOUS | Status: DC | PRN
  Administered 2017-02-03 – 2017-02-04 (×2): 2 mg via INTRAVENOUS
  Filled 2017-02-03 (×2): qty 1

## 2017-02-03 MED ORDER — ONDANSETRON HCL 4 MG/2ML IJ SOLN: 4.0000 mg | Freq: Four times a day (QID) | INTRAMUSCULAR | Status: DC | PRN

## 2017-02-03 MED ORDER — ACETAMINOPHEN 650 MG RE SUPP: 650.0000 mg | Freq: Four times a day (QID) | RECTAL | Status: DC | PRN

## 2017-02-03 MED ORDER — HALOPERIDOL LACTATE 2 MG/ML PO CONC
0.5000 mg | ORAL | Status: DC | PRN
  Filled 2017-02-03: qty 0.3

## 2017-02-03 MED ORDER — GLYCOPYRROLATE 1 MG PO TABS
1.0000 mg | ORAL_TABLET | ORAL | Status: DC | PRN
  Filled 2017-02-03: qty 1

## 2017-02-03 MED ORDER — ONDANSETRON 4 MG PO TBDP: 4.0000 mg | ORAL_TABLET | Freq: Four times a day (QID) | ORAL | Status: DC | PRN

## 2017-02-03 MED ORDER — ACETAMINOPHEN 325 MG PO TABS: 650.0000 mg | ORAL_TABLET | Freq: Four times a day (QID) | ORAL | Status: DC | PRN

## 2017-02-03 MED ORDER — ACETAMINOPHEN 500 MG PO TABS
1000.0000 mg | ORAL_TABLET | Freq: Once | ORAL | Status: AC
  Administered 2017-02-03: 1000 mg via ORAL
  Filled 2017-02-03: qty 2

## 2017-02-03 MED ORDER — FENTANYL CITRATE (PF) 100 MCG/2ML IJ SOLN
12.5000 ug | INTRAMUSCULAR | Status: DC | PRN
  Administered 2017-02-03: 12.5 ug via INTRAVENOUS
  Filled 2017-02-03: qty 2

## 2017-02-03 MED ORDER — HALOPERIDOL LACTATE 5 MG/ML IJ SOLN
0.5000 mg | INTRAMUSCULAR | Status: DC | PRN
  Filled 2017-02-03: qty 0.1

## 2017-02-03 MED ORDER — HALOPERIDOL 0.5 MG PO TABS
0.5000 mg | ORAL_TABLET | ORAL | Status: DC | PRN
  Filled 2017-02-03: qty 1

## 2017-02-03 MED ORDER — BIOTENE DRY MOUTH MT LIQD: 15.0000 mL | OROMUCOSAL | Status: DC | PRN

## 2017-02-03 NOTE — ED Provider Notes (Signed)
[facility] Emergency Department Provider Note    First MD Initiated Contact with Patient 02/03/17 [phone]     (approximate)  I have reviewed the triage vital signs and the nursing notes.   HISTORY  Chief Complaint Fall  Level V Caveat:  Encephalopathy - presumed dementia vs traumatic  HPI Sharon [name] is a 84 y.o. female presents from [place] for evaluation of fall with right shoulder pain.  Patient reportedly fell out of bed.  Has had frequent visits to the ER for falls and weakness.  Presents with bilateral contusions and bruising to the head.  Is obvious deformity to the right shoulder.  Patient unable to provide any history.  Did receive fentanyl in route.  Past Medical History:  Diagnosis Date  . Anemia   . Arthritis   . CAD (coronary artery disease)   . Cancer ([place])    skin  . Cardiomyopathy ([place])   . CHF (congestive heart failure) ([place])   . Depression   . IBS (irritable bowel syndrome) 2010  . Kidney failure July 2012   Hemodialysis 3xweek  . Kidney failure   . Meniere disease   . Meniere's disease   . Myocardial infarction ([place])   . OSA (obstructive sleep apnea)    CPAP  . Peritoneal dialysis status ([place])   . Presence of permanent cardiac pacemaker   . Renal insufficiency    Family History  Problem Relation Age of Onset  . Cancer Mother        ? ovarian - sarcoma  . Cancer Father        Skin cancer  . Diabetes Sister   . COPD Sister   . Depression Sister   . Cancer Sister        Lung - 63 yrs old   Past Surgical History:  Procedure Laterality Date  . [place]  . ANUS SURGERY  2010  . AV FISTULA PLACEMENT Right 04/15/2016   Procedure: ARTERIOVENOUS (AV) FISTULA CREATION ( RADIOCEPHALIC ) STAGE 2;  Surgeon: Algernon Huxley, MD;  Location: [facility] ORS;  Service: Vascular;  Laterality: Right;  . CATARACT EXTRACTION  2006, 2011  . CHOLECYSTECTOMY  01/2008  . DIALYSIS/PERMA CATHETER INSERTION N/A  01/07/2017   Procedure: DIALYSIS/PERMA CATHETER INSERTION With an IVC filter placement;  Surgeon: Algernon Huxley, MD;  Location: [place] [facility];  Service: Cardiovascular;  Laterality: N/A;  . DIALYSIS/PERMA CATHETER REMOVAL N/A 08/20/2016   Procedure: Dialysis/Perma Catheter Removal;  Surgeon: Algernon Huxley, MD;  Location: [place] [facility];  Service: Cardiovascular;  Laterality: N/A;  . EYE SURGERY     cataracts bilateral  . FEMUR IM NAIL Left 12/15/2015   Procedure: INTRAMEDULLARY (IM) RETROGRADE FEMORAL NAILING;  Surgeon: Oletta Cohn, DO;  Location: [facility] ORS;  Service: Orthopedics;  Laterality: Left;  . HIP PINNING,CANNULATED Left 05/10/2016   Procedure: CANNULATED HIP PINNING;  Surgeon: Earnestine Leys, MD;  Location: [facility] ORS;  Service: Orthopedics;  Laterality: Left;  . INSERT / REPLACE / REMOVE PACEMAKER    . INSERTION OF DIALYSIS CATHETER  07/2010  . JOINT REPLACEMENT     left knee  . MINOR REMOVAL OF PERITONEAL DIALYSIS CATHETER  04/15/2016   Procedure: MINOR REMOVAL OF PERITONEAL DIALYSIS CATHETER;  Surgeon: Algernon Huxley, MD;  Location: [facility] ORS;  Service: Vascular;;  . PACEMAKER INSERTION  2006  . PERIPHERAL VASCULAR CATHETERIZATION N/A 12/18/2015   Procedure: Dialysis/Perma Catheter Insertion;  Surgeon: Katha Cabal, MD;  Location: [place] [facility];  Service: Cardiovascular;  Laterality: N/A;  . TOTAL KNEE ARTHROPLASTY  2008   LEFT/Dr [place]   Patient Active Problem List   Diagnosis Date Noted  . FTT (failure to thrive) in adult 02/03/2017  . ESRD on hemodialysis ([place])   . Pleural effusion on right   . Palliative care by specialist   . Pulmonary embolism ([place]) 01/02/2017  . Syncope 01/01/2017  . Vision changes 08/27/2016  . Closed fracture of neck of femur ([place]) 05/26/2016  . Hip fracture ([place]) 05/10/2016  . Falls 04/27/2016  . Head injury 04/27/2016  . Age-related osteoporosis with current pathological fracture with routine healing 04/24/2016  . Recurrent  falls 04/24/2016  . Mild protein-calorie malnutrition ([place]) 04/24/2016  . ESRD on dialysis ([place]) 04/10/2016  . Periprosthetic fracture around internal prosthetic left knee joint 01/26/2016  . Pressure injury of skin 12/15/2015  . Closed left subtrochanteric femur fracture ([place]) 12/15/2015  . Femur fracture, left ([place]) 12/14/2015  . Meniere disease   . Loss of weight 10/21/2015  . Clinical depression 06/20/2015  . Dysphagia, unspecified 05/24/2015  . Imbalance 04/22/2015  . Chronic pain syndrome 05/22/2014  . Insomnia 03/13/2014  . Anxiety state 03/13/2014  . Chronic systolic CHF (congestive heart failure), NYHA class 3 ([place]) 01/15/2014  . OSA (obstructive sleep apnea) 01/15/2014  . Chronic kidney disease (CKD), stage V ([place]) 01/15/2014  . Basal cell carcinoma of neck 11/14/2013  . DDD (degenerative disc disease), cervical 11/07/2013  . Cervical radiculitis 10/16/2013  . Benign essential hypertension 09/12/2013  . Memory loss 09/12/2013  . Systolic heart failure, chronic ([place]) 05/12/2013  . Goals of care, counseling/discussion 11/10/2012  . Sinus node dysfunction ([place]) 08/01/2012  . Low back pain 08/01/2012  . Cervical spine pain 05/02/2012  . Irritable bowel syndrome 11/02/2011  . Depression 04/01/2011  . Hypertension 04/01/2011      Prior to Admission medications   Medication Sig Start Date End Date Taking? Authorizing Provider  folic acid-vitamin b complex-vitamin c-selenium-zinc (DIALYVITE) 3 MG TABS tablet Take 1 tablet by mouth daily.   Yes [provider]  levothyroxine (SYNTHROID, LEVOTHROID) 25 MCG tablet Take 1 tablet (25 mcg total) by mouth daily. Patient taking differently: Take 25 mcg by mouth daily. (0800) 10/25/15  Yes [name], MD  metoprolol succinate (TOPROL XL) 25 MG 24 hr tablet Take 1 tablet (25 mg total) by mouth daily. 01/08/17  Yes Wieting, Richard, MD  polyethylene glycol (MIRALAX / GLYCOLAX) packet Take 17 g by mouth daily as needed  for mild constipation. 01/08/17  Yes Wieting, Richard, MD  ramipril (ALTACE) 2.5 MG capsule Take 1 capsule (2.5 mg total) by mouth at bedtime. 01/08/17  Yes Wieting, Richard, MD  sertraline (ZOLOFT) 50 MG tablet TAKE 3 TABLETS(150 MG) BY MOUTH DAILY 08/30/15  Yes [name], MD  Vitamin D, Ergocalciferol, (DRISDOL) 50000 units CAPS capsule Take 50,000 Units by mouth every Monday. (0900) 04/17/15  Yes [provider]  acetaminophen (TYLENOL) 325 MG tablet Take 2 tablets (650 mg total) by mouth every 4 (four) hours as needed for mild pain or moderate pain. 05/14/16   Loletha Grayer, MD  diphenoxylate-atropine (LOMOTIL) 2.5-0.025 MG tablet TAKE 1 TABLET BY MOUTH FOUR TIMES DAILY AS NEEDED FOR DIARRHEA OR LOOSE STOOLS Patient taking differently: TAKE 1 TABLET BY MOUTH every 6 hours AS NEEDED FOR DIARRHEA OR LOOSE STOOLS 08/30/15   [name], MD  lidocaine-prilocaine (EMLA) cream Apply 1 application topically every Monday, Wednesday, and Friday  with hemodialysis. (1100) apply to right arm dialysis access pre-dialysis wrap with saran wrap after applying.    [provider]  metoCLOPramide (REGLAN) 5 MG tablet Take 1 tablet (5 mg total) by mouth every 8 (eight) hours as needed (vertigo). 05/30/16   Menshew, Dannielle Karvonen, PA-C  Nutritional Supplements (FEEDING SUPPLEMENT, NEPRO CARB STEADY,) LIQD Take 237 mLs by mouth 2 (two) times daily between meals. 01/08/17   Loletha Grayer, MD  traMADol (ULTRAM) 50 MG tablet Take 1 tablet (50 mg total) by mouth every 12 (twelve) hours as needed for moderate pain or severe pain. Patient not taking: Reported on 01/28/2017 01/08/17   Loletha Grayer, MD    Allergies Statins; Codeine sulfate; Codeine; Effexor [venlafaxine]; and Ezetimibe-simvastatin    Social History Social History   Tobacco Use  . Smoking status: Former Smoker    Last attempt to quit: 03/31/1948    Years since quitting: 68.8  . Smokeless tobacco: Never Used  Substance Use  Topics  . Alcohol use: No  . Drug use: No    Review of Systems Patient denies headaches, rhinorrhea, blurry vision, numbness, shortness of breath, chest pain, edema, cough, abdominal pain, nausea, vomiting, diarrhea, dysuria, fevers, rashes or hallucinations unless otherwise stated above in HPI. ____________________________________________   PHYSICAL EXAM:  VITAL SIGNS: Vitals:   02/03/17 1230 02/03/17 1256  BP: (!) 143/56 (!) 143/56  Pulse: 61 80  Resp:  18  Temp:    SpO2: 92% 96%    Constitutional: Frail, chronically ill-appearing  eyes: Conjunctivae are normal.  Head: Bilateral frontotemporal contusion and bruising.  No raccoon eyes or battle sign Nose: No congestion/rhinnorhea. Mouth/Throat: Mucous membranes are moist.   Neck: No stridor. Painless ROM.  Cardiovascular: Normal rate, regular rhythm. Grossly normal heart sounds.  Good peripheral circulation. Respiratory: Normal respiratory effort.  No retractions. Lungs CTAB. Gastrointestinal: Soft and nontender. No distention. No abdominal bruits. No CVA tenderness. Genitourinary:  Musculoskeletal: Obvious deformity of the right shoulder along the distal clavicle with frail skin where you can easily palpate the underlying bone fragments but no evidence of tenting. no lower extremity tenderness nor edema.  No joint effusions. Neurologic:  Normal speech and language. No gross focal neurologic deficits are appreciated. No facial droop Skin:  Skin is warm, dry and intact. No rash noted. Psychiatric: Mood and affect are normal. Speech and behavior are normal.  ____________________________________________   LABS (all labs ordered are listed, but only abnormal results are displayed)  Results for orders placed or performed during the hospital encounter of 02/03/17 (from the past 24 hour(s))  CBC with Differential/Platelet     Status: Abnormal   Collection Time: 02/03/17  6:16 AM  Result Value Ref Range   WBC 7.7 3.6 - 11.0  K/uL   RBC 2.78 (L) 3.80 - 5.20 MIL/uL   Hemoglobin 9.0 (L) 12.0 - 16.0 g/dL   HCT 27.6 (L) 35.0 - 47.0 %   MCV 99.4 80.0 - 100.0 fL   MCH 32.5 26.0 - 34.0 pg   MCHC 32.7 32.0 - 36.0 g/dL   RDW 15.7 (H) 11.5 - 14.5 %   Platelets 240 150 - 440 K/uL   Neutrophils Relative % 71 %   Neutro Abs 5.5 1.4 - 6.5 K/uL   Lymphocytes Relative 17 %   Lymphs Abs 1.3 1.0 - 3.6 K/uL   Monocytes Relative 7 %   Monocytes Absolute 0.5 0.2 - 0.9 K/uL   Eosinophils Relative 4 %   Eosinophils Absolute 0.3 0 -  0.7 K/uL   Basophils Relative 1 %   Basophils Absolute 0.0 0 - 0.1 K/uL  Basic metabolic panel     Status: Abnormal   Collection Time: 02/03/17  6:16 AM  Result Value Ref Range   Sodium 134 (L) 135 - 145 mmol/L   Potassium 3.4 (L) 3.5 - 5.1 mmol/L   Chloride 98 (L) 101 - 111 mmol/L   CO2 22 22 - 32 mmol/L   Glucose, Bld 112 (H) 65 - 99 mg/dL   BUN 50 (H) 6 - 20 mg/dL   Creatinine, Ser 3.70 (H) 0.44 - 1.00 mg/dL   Calcium 9.0 8.9 - 10.3 mg/dL   GFR calc non Af Amer 10 (L) >60 mL/min   GFR calc Af Amer 12 (L) >60 mL/min   Anion gap 14 5 - 15  Protime-INR     Status: None   Collection Time: 02/03/17  6:16 AM  Result Value Ref Range   Prothrombin Time 14.3 11.4 - 15.2 seconds   INR 1.12   Urinalysis, Complete w Microscopic     Status: Abnormal   Collection Time: 02/03/17  8:15 AM  Result Value Ref Range   Color, Urine YELLOW (A) YELLOW   APPearance CLEAR (A) CLEAR   Specific Gravity, Urine 1.014 1.005 - 1.030   pH 5.0 5.0 - 8.0   Glucose, UA NEGATIVE NEGATIVE mg/dL   Hgb urine dipstick NEGATIVE NEGATIVE   Bilirubin Urine NEGATIVE NEGATIVE   Ketones, ur NEGATIVE NEGATIVE mg/dL   Protein, ur 100 (A) NEGATIVE mg/dL   Nitrite NEGATIVE NEGATIVE   Leukocytes, UA NEGATIVE NEGATIVE   RBC / HPF 0-5 0 - 5 RBC/hpf   WBC, UA 0-5 0 - 5 WBC/hpf   Bacteria, UA NONE SEEN NONE SEEN   Squamous Epithelial / LPF 0-5 (A) NONE SEEN   Mucus PRESENT     ____________________________________________  EKG My review and personal interpretation at Time: 6:11   Indication: fall  Rate: 75  Rhythm: vpaced Axis: left Other: no stemi, no evidence of acute ischemia ____________________________________________  RADIOLOGY  I personally reviewed all radiographic images ordered to evaluate for the above acute complaints and reviewed radiology reports and findings.  These findings were personally discussed with the patient.  Please see medical record for radiology report.  ____________________________________________   PROCEDURES  Procedure(s) performed:  Procedures    Critical Care performed: no ____________________________________________   INITIAL IMPRESSION / ASSESSMENT AND PLAN / ED COURSE  Pertinent labs & imaging results that were available during my care of the patient were reviewed by me and considered in my medical decision making (see chart for details).  DDX: fracture, contusion, dislocation, sdh, iph  Sharon [name] is a 84 y.o. who presents to the ED with symptoms as described above.  Patient is very frail and chronically ill-appearing.  CT imaging ordered for the above differential shows no evidence of acute intracranial abnormality.  Blood work sent as well shows no evidence of hyperkalemia or acidosis.  X-ray does show evidence of displaced right distal clavicular fracture with some degree of tenting therefore will touch base with orthopedics.  Unfortunately patient will be very high risk for any operative fixation at this time.  The patient will be placed on continuous pulse oximetry and telemetry for monitoring.  Laboratory evaluation will be sent to evaluate for the above complaints.     Clinical Course as of Feb 03 1410  Wed Feb 03, 2017  0811 Had extended discussion with the patient's brother  and age POA regarding her goals of care according to them that they would want to proceed with intermediate care options  including operation for fixation only in the interest of pain control and preventing worsening disability.  She does not want to be kept on prolonged life support.  Patient has been evaluated orthopedics at bedside and feels that would be appropriate for nonoperative management particularly in the setting of her extensive comorbidities.  Patient \\exceedingly  high risk for surgery.  [PR]  [phone] States she has no significant pain right now when not moving.  BMP shows no evidence of hyperkalemia but BUN is elevated.  No significant acidosis.  Patient states that she is a little worried about going back to [facility] as she feels that she is falling more frequently and does not have the resources.  Will consult social work and case management  [PR]  1115 Currently in discussions with family social work and case management regarding options for her continued care.  Family is more interested at this point in pursuing hospice with her palliative care.  I previously been a limited option because she wanted to continue doing dialysis however they are not sure they were.  She has no evidence of acute hyperkalemia or volume overload.  [PR]  1158 This had extensive conversation with family and patient with social work at bedside.  Based on her recent deterioration decline with significant weakness with frequent falls they would like to further pursue comfort care measures.  Would also like to be evaluated to see if they would meet criteria for hospice home placement.  Will give additional IV pain medication.  I will consult hospitalist for admission for comfort care measures and palliative care consultation.  [PR]    Clinical Course User Index [PR] Merlyn Lot, MD     ____________________________________________   FINAL CLINICAL IMPRESSION(S) / ED DIAGNOSES  Final diagnoses:  Closed displaced fracture of acromial end of right clavicle, initial encounter  Frequent falls  Hypertensive CKD, ESRD on  dialysis [facility])      NEW MEDICATIONS STARTED DURING THIS VISIT:  This SmartLink is deprecated. Use AVSMEDLIST instead to display the medication list for a patient.   Note:  This document was prepared using Dragon voice recognition software and may include unintentional dictation errors.    Merlyn Lot, MD 02/03/17 [phone]

## 2017-02-03 NOTE — Clinical Social Work Note (Signed)
Patient now admitted to medical floor from ED and is being made comfort care. CSW will continue to follow. Shela Leff MSW,LCSW [phone]

## 2017-02-03 NOTE — Clinical Social Work Note (Signed)
Dr. Abigail Butts, with nephrology, spoke with patient's brother this afternoon. CSW was asked to join in the conversation. The brother expressed wishes of getting patient to the hospice home. Just minutes after this conversation, it was noted that patient is alert and oriented X4. CSW informed patient's brother that the patient would need to be the one to make the decision of stopping dialysis and pursuing hospice home. CSW informed the brother that hospice home will not be pursued unless patient is able to give consent. Patient's brother stated that he understood and that he and other family members would discuss this with patient this evening. CSW clarified with the brother that if he relays patient has given consent to pursue the hospice home, that myself and the hospice home representative will be speaking directly with the patient. He verbalized understanding. CSW spoke with Santiago Glad with [facility] and informed her of the current situation and to delay coming to see patient until we know what patient's decision will be.   CSW spoke at length with patient's brother later in the afternoon and provided supportive counseling as he processed how difficult the discussion will be with his sister. CSW will follow up in the morning. Palliative care is also to see patient and family in the morning. Shela Leff MSW,LCSW [phone]

## 2017-02-03 NOTE — ED Triage Notes (Addendum)
Pt arrived via EMS from [facility] with complaints of a fall out of bed. Pt is alert and oriented X 4. Pt had no LOC. Pt states pain 10/10 in her neck and right shoulder. Back of right shoulder has obvious deformity. Pt has a 20 G in left forearm. Pt was given 30mcg Fentanyl via EMS. EMS reported that the bruising on front of head is old and did not occur from her fall tonight. Pt also has a hematoma to right side of head to which she is not sure if it happened tonight or another night. States that she falls often.

## 2017-02-03 NOTE — Progress Notes (Signed)
Pt having generalized fpain 'I don't know what hurst but it's 8/10.  IV MSO4 given and pt began hollering in pain, IV Fentanyl given. Family at bedside.

## 2017-02-03 NOTE — ED Notes (Signed)
Pt brother, sisterNlaw and nephew are at the bedside and spoke with EDP about plan of care. Will follow up with care management.

## 2017-02-03 NOTE — Progress Notes (Signed)
Pain didn't get better w/MSO4 so gave Fentynl-it relieved her pain. More family members have arrived and are at bedside.

## 2017-02-03 NOTE — Progress Notes (Signed)
[facility]  ROUNDING NOTE   Subjective:   Sharon [name] admitted to [facility] on 02/03/2017 for Fall  Patient's family is concerned that patient may be entering end of life due to her many co-morbidities.   She has been admitted in anticipation of hospice services. Patient is unsure.   Objective:  Vital signs in last 24 hours:  Temp:  [97.7 F (36.5 C)-97.8 F (36.6 C)] 97.8 F (36.6 C) (01/09 1425) Pulse Rate:  [58-80] 62 (01/09 1425) Resp:  [14-18] 15 (01/09 1425) BP: (132-155)/(48-76) 141/48 (01/09 1425) SpO2:  [90 %-100 %] 97 % (01/09 1425) Weight:  [44.5 kg (98 lb)] 44.5 kg (98 lb) (01/09 0603)  Weight change:  Filed Weights   02/03/17 0603  Weight: 44.5 kg (98 lb)    Intake/Output: I/O last 3 completed shifts: In: 300 [P.O.:300] Out: -    Intake/Output this shift:  No intake/output data recorded.  Physical Exam: General: NAD,   Head: Normocephalic, atraumatic. Moist oral mucosal membranes  Eyes: Anicteric, PERRL  Neck: Supple, trachea midline  Lungs:  Clear to auscultation  Heart: Regular rate and rhythm  Abdomen:  Soft, nontender,   Extremities: no peripheral edema.  Neurologic: Nonfocal, moving all four extremities  Skin: No lesions  Access: Left IJ permcath    Basic Metabolic Panel: Recent Labs  Lab 02/03/17 0616  NA 134*  K 3.4*  CL 98*  CO2 22  GLUCOSE 112*  BUN 50*  CREATININE 3.70*  CALCIUM 9.0    Liver Function Tests: No results for input(s): AST, ALT, ALKPHOS, BILITOT, PROT, ALBUMIN in the last 168 hours. No results for input(s): LIPASE, AMYLASE in the last 168 hours. No results for input(s): AMMONIA in the last 168 hours.  CBC: Recent Labs  Lab 02/03/17 0616  WBC 7.7  NEUTROABS 5.5  HGB 9.0*  HCT 27.6*  MCV 99.4  PLT 240    Cardiac Enzymes: No results for input(s): CKTOTAL, CKMB, CKMBINDEX, TROPONINI in the last 168 hours.  BNP: Invalid input(s): POCBNP  CBG: No results for input(s): GLUCAP in the  last 168 hours.  Microbiology: Results for orders placed or performed during the hospital encounter of 01/01/17  Culture, blood (routine x 2)     Status: None   Collection Time: 01/01/17  6:39 PM  Result Value Ref Range Status   Specimen Description BLOOD LEFT ANTECUBITAL  Final   Special Requests   Final    BOTTLES DRAWN AEROBIC AND ANAEROBIC Blood Culture adequate volume   Culture NO GROWTH 5 DAYS  Final   Report Status 01/06/2017 FINAL  Final  Culture, blood (routine x 2)     Status: None   Collection Time: 01/01/17  6:48 PM  Result Value Ref Range Status   Specimen Description BLOOD LEFT HAND  Final   Special Requests   Final    BOTTLES DRAWN AEROBIC AND ANAEROBIC Blood Culture adequate volume   Culture NO GROWTH 5 DAYS  Final   Report Status 01/06/2017 FINAL  Final  MRSA PCR Screening     Status: None   Collection Time: 01/01/17 10:16 PM  Result Value Ref Range Status   MRSA by PCR NEGATIVE NEGATIVE Final    Comment:        The GeneXpert MRSA Assay (FDA approved for NASAL specimens only), is one component of a comprehensive MRSA colonization surveillance program. It is not intended to diagnose MRSA infection nor to guide or monitor treatment for MRSA infections.  Coagulation Studies: Recent Labs    02/03/17 0616  LABPROT 14.3  INR 1.12    Urinalysis: Recent Labs    02/03/17 0815  COLORURINE YELLOW*  LABSPEC 1.014  PHURINE 5.0  GLUCOSEU NEGATIVE  HGBUR NEGATIVE  BILIRUBINUR NEGATIVE  KETONESUR NEGATIVE  PROTEINUR 100*  NITRITE NEGATIVE  LEUKOCYTESUR NEGATIVE      Imaging: Dg Shoulder Right  Result Date: 02/03/2017 CLINICAL DATA:  Pain following fall EXAM: RIGHT SHOULDER - 2+ VIEW COMPARISON:  None. FINDINGS: Frontal, shallow oblique, and Y scapular images were obtained. There is a comminuted fracture of the distal clavicle with displaced fracture fragments in this area. There appears to be a degree of coracoclavicular separation without  appreciable acromioclavicular separation. No frank dislocation. No other fracture evident. There is mild generalized osteoarthritic change. Bones appear osteoporotic. There is a fairly small right pleural effusion evident. IMPRESSION: Comminuted fracture of the distal right clavicle with displacement of fracture fragments. Coracoclavicular separation noted. Mild generalized osteoarthritic change. No dislocation. Fairly small right pleural effusion. Electronically Signed   By: Lowella Grip III M.D.   On: 02/03/2017 07:54   Ct Head Wo Contrast  Result Date: 02/03/2017 CLINICAL DATA:  Patient fell out of bed. No report of loss of consciousness. Neck pain. RIGHT forehead hematoma. LEFT forehead hematoma reportedly old. EXAM: CT HEAD WITHOUT CONTRAST CT CERVICAL SPINE WITHOUT CONTRAST TECHNIQUE: Multidetector CT imaging of the head and cervical spine was performed following the standard protocol without intravenous contrast. Multiplanar CT image reconstructions of the cervical spine were also generated. COMPARISON:  CT head 01/01/2017. CT head and cervical spine 10/11/2016. FINDINGS: CT HEAD FINDINGS Brain: No evidence of acute infarction, hemorrhage, hydrocephalus, extra-axial collection or mass lesion/mass effect. Moderate atrophy, not unexpected for age. Hypoattenuation of white matter likely small vessel disease. Vascular: Calcification of the cavernous internal carotid arteries consistent with cerebrovascular atherosclerotic disease. No signs of intracranial large vessel occlusion. Skull: Calvarium intact.  BILATERAL frontal scalp hematomas. Sinuses/Orbits: No layering sinus fluid. BILATERAL cataract extraction. Other: No mastoid fluid. CT CERVICAL SPINE FINDINGS Alignment: There is straightening, and slight reversal, of the normal cervical lordosis. 2 mm anterolisthesis C4-5, 1 mm anterolisthesis C3-4, and 1 mm retrolisthesis C6-7 all facet mediated. Skull base and vertebrae: No acute fracture. No primary  bone lesion or focal pathologic process. Soft tissues and spinal canal: No prevertebral fluid or swelling. No visible canal hematoma. Carotid atherosclerosis. Disc levels: Multilevel spondylosis, most pronounced at C5-6 and C6-7. BILATERAL cervical facet arthropathy. Upper chest: Large RIGHT pleural effusion. This appears similar to slightly smaller compared CT cervical spine from September 2018. Other: None. IMPRESSION: Atrophy and small vessel disease similar to priors. No skull fracture or intracranial hemorrhage. BILATERAL scalp hematomas, which could be related to the acute fall. Cervical spondylosis. No cervical spine fracture or traumatic subluxation. RIGHT pleural effusion, which appears chronic based on review of another cervical spine MRI from last year. This effusion however is incompletely evaluated. A chest radiograph is warranted for further evaluation. Electronically Signed   By: Staci Righter M.D.   On: 02/03/2017 07:30   Ct Cervical Spine Wo Contrast  Result Date: 02/03/2017 CLINICAL DATA:  Patient fell out of bed. No report of loss of consciousness. Neck pain. RIGHT forehead hematoma. LEFT forehead hematoma reportedly old. EXAM: CT HEAD WITHOUT CONTRAST CT CERVICAL SPINE WITHOUT CONTRAST TECHNIQUE: Multidetector CT imaging of the head and cervical spine was performed following the standard protocol without intravenous contrast. Multiplanar CT image reconstructions of the cervical spine were  also generated. COMPARISON:  CT head 01/01/2017. CT head and cervical spine 10/11/2016. FINDINGS: CT HEAD FINDINGS Brain: No evidence of acute infarction, hemorrhage, hydrocephalus, extra-axial collection or mass lesion/mass effect. Moderate atrophy, not unexpected for age. Hypoattenuation of white matter likely small vessel disease. Vascular: Calcification of the cavernous internal carotid arteries consistent with cerebrovascular atherosclerotic disease. No signs of intracranial large vessel occlusion.  Skull: Calvarium intact.  BILATERAL frontal scalp hematomas. Sinuses/Orbits: No layering sinus fluid. BILATERAL cataract extraction. Other: No mastoid fluid. CT CERVICAL SPINE FINDINGS Alignment: There is straightening, and slight reversal, of the normal cervical lordosis. 2 mm anterolisthesis C4-5, 1 mm anterolisthesis C3-4, and 1 mm retrolisthesis C6-7 all facet mediated. Skull base and vertebrae: No acute fracture. No primary bone lesion or focal pathologic process. Soft tissues and spinal canal: No prevertebral fluid or swelling. No visible canal hematoma. Carotid atherosclerosis. Disc levels: Multilevel spondylosis, most pronounced at C5-6 and C6-7. BILATERAL cervical facet arthropathy. Upper chest: Large RIGHT pleural effusion. This appears similar to slightly smaller compared CT cervical spine from September 2018. Other: None. IMPRESSION: Atrophy and small vessel disease similar to priors. No skull fracture or intracranial hemorrhage. BILATERAL scalp hematomas, which could be related to the acute fall. Cervical spondylosis. No cervical spine fracture or traumatic subluxation. RIGHT pleural effusion, which appears chronic based on review of another cervical spine MRI from last year. This effusion however is incompletely evaluated. A chest radiograph is warranted for further evaluation. Electronically Signed   By: Staci Righter M.D.   On: 02/03/2017 07:30     Medications:     acetaminophen **OR** acetaminophen, antiseptic oral rinse, fentaNYL (SUBLIMAZE) injection, glycopyrrolate **OR** glycopyrrolate **OR** glycopyrrolate, haloperidol **OR** haloperidol **OR** haloperidol lactate, morphine injection, ondansetron **OR** ondansetron (ZOFRAN) IV, polyvinyl alcohol  Assessment/ Plan:  Ms. ANDE THERRELL is a 84 y.o. white female with end stage renal disease on hemodialysis, coronary artery disease, pacemaker placement, meniere's disease, congestive heart failure, peripheral vascular disease, multiple  falls and fractures.   MWF [facility]   1. End Stage Renal Disease: on hemodialysis. Holding hemodialysis until patient decides on goals of care.   2. Hypertension: blood pressure at goal. Holding home blood pressure medications.   3. Anemia of chronic kidney disease: hemoglobin 9 - EPO with HD treatments  4. Secondary Hyperparathyroidism: outpatient PTH, calcium and phosphorus at goal. Not currently on binders.    LOS: 0 Carleta Woodrow 1/9/20197:33 PM

## 2017-02-03 NOTE — Progress Notes (Signed)
Pt admitted from ED for palliative care services and possible hospice placement. Family has NOT discussed hospice and end of life services with the patient. Since the patient is alert and oriented it is not appropriate for them to make these decisions for her while she has capacity. Family prefers to discuss these matters with her and then have hospice and palliative care become involved if the patient is agreeable.

## 2017-02-03 NOTE — Care Management Note (Signed)
Case Management Note  Patient Details  [name] MRN: [id number] Date of Birth: [birth year]  Subjective/Objective:      Called the facility to verify, patient is getting services from [facility] Informed both the RN for the patient and the MD.              Action/Plan:   Expected Discharge Date:                  Expected Discharge Plan:     In-House Referral:     Discharge planning Services     Post Acute Care Choice:    Choice offered to:     DME Arranged:    DME Agency:     HH Arranged:    [place] Agency:     Status of Service:     If discussed at [employer], dates discussed:    Additional Comments:  Beau Fanny, RN 02/03/2017, 10:48 AM

## 2017-02-03 NOTE — Clinical Social Work Note (Signed)
CSW spoke with patient's brother again later this afternoon and it was decided that the family would wait to talk with the patient in the morning about considering stopping dialysis and choosing comforting measures due to the fact that the patient had just been administered morphine. Shela Leff MSW,LCSW [phone]

## 2017-02-03 NOTE — H&P (Signed)
[facility] [place] [name]    MR#:  [id number]  DATE OF BIRTH:  1926-05-16  DATE OF ADMISSION:  02/03/2017  PRIMARY CARE PHYSICIAN: Kirk Ruths, MD   REQUESTING/REFERRING PHYSICIAN: Merlyn Lot, MD  CHIEF COMPLAINT:   fall HISTORY OF PRESENT ILLNESS:  Sharon [name]  is a 84 y.o. female with a known history of end-stage renal disease on hemodialysis, coronary artery disease, cardiomyopathy, frequent falls, obstructive sleep apnea and multiple other medical problems is brought into the ED from [facility] after she sustained another fall today.  Patient was reporting right shoulder pain.  X-ray has revealed a right clavicular fracture.  ED physician has discussed patient was evaluated by orthopedics at bedside who is not considering surgery as patient is at high risk.  ED physician has discussed with the patient and healthcare power of attorney had brother who has requested comfort care measures and considering hospice home. As  hospice home beds are not available hospitalist team was called to admit the patient to implement comfort care measures.  Patient was in pain during my examination and requested to talk to her brother who is the healthcare power of attorney  PAST MEDICAL HISTORY:   Past Medical History:  Diagnosis Date  . Anemia   . Arthritis   . CAD (coronary artery disease)   . Cancer ([place])    skin  . Cardiomyopathy ([place])   . CHF (congestive heart failure) ([place])   . Depression   . IBS (irritable bowel syndrome) 2010  . Kidney failure July 2012   Hemodialysis 3xweek  . Kidney failure   . Meniere disease   . Meniere's disease   . Myocardial infarction ([place])   . OSA (obstructive sleep apnea)    CPAP  . Peritoneal dialysis status ([place])   . Presence of permanent cardiac pacemaker   . Renal insufficiency     PAST SURGICAL HISTOIRY:   Past Surgical History:  Procedure Laterality Date  .  [place]  . ANUS SURGERY  2010  . AV FISTULA PLACEMENT Right 04/15/2016   Procedure: ARTERIOVENOUS (AV) FISTULA CREATION ( RADIOCEPHALIC ) STAGE 2;  Surgeon: Algernon Huxley, MD;  Location: [facility] ORS;  Service: Vascular;  Laterality: Right;  . CATARACT EXTRACTION  2006, 2011  . CHOLECYSTECTOMY  01/2008  . DIALYSIS/PERMA CATHETER INSERTION N/A 01/07/2017   Procedure: DIALYSIS/PERMA CATHETER INSERTION With an IVC filter placement;  Surgeon: Algernon Huxley, MD;  Location: [place] [facility];  Service: Cardiovascular;  Laterality: N/A;  . DIALYSIS/PERMA CATHETER REMOVAL N/A 08/20/2016   Procedure: Dialysis/Perma Catheter Removal;  Surgeon: Algernon Huxley, MD;  Location: [place] [facility];  Service: Cardiovascular;  Laterality: N/A;  . EYE SURGERY     cataracts bilateral  . FEMUR IM NAIL Left 12/15/2015   Procedure: INTRAMEDULLARY (IM) RETROGRADE FEMORAL NAILING;  Surgeon: Oletta Cohn, DO;  Location: [facility] ORS;  Service: Orthopedics;  Laterality: Left;  . HIP PINNING,CANNULATED Left 05/10/2016   Procedure: CANNULATED HIP PINNING;  Surgeon: Earnestine Leys, MD;  Location: [facility] ORS;  Service: Orthopedics;  Laterality: Left;  . INSERT / REPLACE / REMOVE PACEMAKER    . INSERTION OF DIALYSIS CATHETER  07/2010  . JOINT REPLACEMENT     left knee  . MINOR REMOVAL OF PERITONEAL DIALYSIS CATHETER  04/15/2016   Procedure: MINOR REMOVAL OF PERITONEAL DIALYSIS CATHETER;  Surgeon: Algernon Huxley, MD;  Location: [facility] ORS;  Service:  Vascular;;  . PACEMAKER INSERTION  2006  . PERIPHERAL VASCULAR CATHETERIZATION N/A 12/18/2015   Procedure: Dialysis/Perma Catheter Insertion;  Surgeon: Katha Cabal, MD;  Location: [place] [facility];  Service: Cardiovascular;  Laterality: N/A;  . TOTAL KNEE ARTHROPLASTY  2008   LEFT/Dr [place]    SOCIAL HISTORY:   Social History   Tobacco Use  . Smoking status: Former Smoker    Last attempt to quit: 03/31/1948    Years since quitting: 68.8  . Smokeless  tobacco: Never Used  Substance Use Topics  . Alcohol use: No    FAMILY HISTORY:   Family History  Problem Relation Age of Onset  . Cancer Mother        ? ovarian - sarcoma  . Cancer Father        Skin cancer  . Diabetes Sister   . COPD Sister   . Depression Sister   . Cancer Sister        Lung - 36 yrs old    DRUG ALLERGIES:   Allergies  Allergen Reactions  . Statins Other (See Comments)    Muscle weakness Muscle weakness severe  . Codeine Sulfate Nausea And Vomiting  . Codeine Other (See Comments)    GI UPSET  . Effexor [Venlafaxine] Nausea Only  . Ezetimibe-Simvastatin Other (See Comments)    Muscle weakness    REVIEW OF SYSTEMS:  CONSTITUTIONAL: No fever, fatigue or weakness.  EYES: No blurred or double vision.  EARS, NOSE, AND THROAT: No tinnitus or ear pain.  RESPIRATORY: No cough, shortness of breath, wheezing or hemoptysis.  CARDIOVASCULAR: No chest pain, orthopnea, edema.  GASTROINTESTINAL: No nausea, vomiting, diarrhea or abdominal pain.  GENITOURINARY: No dysuria, hematuria.  ENDOCRINE: No polyuria, nocturia,  HEMATOLOGY: No anemia, easy bruising or bleeding SKIN: No rash or lesion. MUSCULOSKELETAL: Right shoulder pain, frequent falls NEUROLOGIC: No tingling, numbness, weakness.  PSYCHIATRY: No anxiety or depression.   MEDICATIONS AT HOME:   Prior to Admission medications   Medication Sig Start Date End Date Taking? Authorizing Provider  folic acid-vitamin b complex-vitamin c-selenium-zinc (DIALYVITE) 3 MG TABS tablet Take 1 tablet by mouth daily.   Yes [provider]  levothyroxine (SYNTHROID, LEVOTHROID) 25 MCG tablet Take 1 tablet (25 mcg total) by mouth daily. Patient taking differently: Take 25 mcg by mouth daily. (0800) 10/25/15  Yes [name], MD  metoprolol succinate (TOPROL XL) 25 MG 24 hr tablet Take 1 tablet (25 mg total) by mouth daily. 01/08/17  Yes Wieting, Richard, MD  polyethylene glycol (MIRALAX / GLYCOLAX) packet  Take 17 g by mouth daily as needed for mild constipation. 01/08/17  Yes Wieting, Richard, MD  ramipril (ALTACE) 2.5 MG capsule Take 1 capsule (2.5 mg total) by mouth at bedtime. 01/08/17  Yes Wieting, Richard, MD  sertraline (ZOLOFT) 50 MG tablet TAKE 3 TABLETS(150 MG) BY MOUTH DAILY 08/30/15  Yes [name], MD  Vitamin D, Ergocalciferol, (DRISDOL) 50000 units CAPS capsule Take 50,000 Units by mouth every Monday. (0900) 04/17/15  Yes [provider]  acetaminophen (TYLENOL) 325 MG tablet Take 2 tablets (650 mg total) by mouth every 4 (four) hours as needed for mild pain or moderate pain. 05/14/16   Loletha Grayer, MD  diphenoxylate-atropine (LOMOTIL) 2.5-0.025 MG tablet TAKE 1 TABLET BY MOUTH FOUR TIMES DAILY AS NEEDED FOR DIARRHEA OR LOOSE STOOLS Patient taking differently: TAKE 1 TABLET BY MOUTH every 6 hours AS NEEDED FOR DIARRHEA OR LOOSE STOOLS 08/30/15   [name], MD  lidocaine-prilocaine (EMLA) cream Apply 1 application topically every Monday, Wednesday, and Friday with hemodialysis. (1100) apply to right arm dialysis access pre-dialysis wrap with saran wrap after applying.    [provider]  metoCLOPramide (REGLAN) 5 MG tablet Take 1 tablet (5 mg total) by mouth every 8 (eight) hours as needed (vertigo). 05/30/16   Menshew, Dannielle Karvonen, PA-C  Nutritional Supplements (FEEDING SUPPLEMENT, NEPRO CARB STEADY,) LIQD Take 237 mLs by mouth 2 (two) times daily between meals. 01/08/17   Loletha Grayer, MD  traMADol (ULTRAM) 50 MG tablet Take 1 tablet (50 mg total) by mouth every 12 (twelve) hours as needed for moderate pain or severe pain. Patient not taking: Reported on 01/28/2017 01/08/17   Loletha Grayer, MD      VITAL SIGNS:  Blood pressure (!) 141/48, pulse 62, temperature 97.8 F (36.6 C), temperature source Oral, resp. rate 15, height 5\' 4"  (1.626 m), weight 44.5 kg (98 lb), SpO2 97 %.  PHYSICAL EXAMINATION:  GENERAL:  84 y.o.-year-old patient lying in the bed  with no acute distress.  Thin looking female EYES: Pupils equal, round, reactive to light and accommodation. No scleral icterus. Extraocular muscles intact.  HEENT: Head atraumatic, normocephalic. Oropharynx and nasopharynx clear.  NECK:  Supple, no jugular venous distention. No thyroid enlargement, no tenderness.  LUNGS: Normal breath sounds bilaterally, no wheezing, rales,rhonchi or crepitation. No use of accessory muscles of respiration.  CARDIOVASCULAR: S1, S2 normal. No murmurs, rubs, or gallops.  ABDOMEN: Soft, nontender, nondistended. Bowel sounds present. No organomegaly or mass.  EXTREMITIES: Right clavicle is tender, no pedal edema, cyanosis, or clubbing.  NEUROLOGIC: Awake and alert oriented x3 PSYCHIATRIC: The patient is alert and oriented x 3.  SKIN: No obvious rash, lesion, or ulcer.   LABORATORY PANEL:   CBC Recent Labs  Lab 02/03/17 0616  WBC 7.7  HGB 9.0*  HCT 27.6*  PLT 240   ------------------------------------------------------------------------------------------------------------------  Chemistries  Recent Labs  Lab 02/03/17 0616  NA 134*  K 3.4*  CL 98*  CO2 22  GLUCOSE 112*  BUN 50*  CREATININE 3.70*  CALCIUM 9.0   ------------------------------------------------------------------------------------------------------------------  Cardiac Enzymes No results for input(s): TROPONINI in the last 168 hours. ------------------------------------------------------------------------------------------------------------------  RADIOLOGY:  Dg Shoulder Right  Result Date: 02/03/2017 CLINICAL DATA:  Pain following fall EXAM: RIGHT SHOULDER - 2+ VIEW COMPARISON:  None. FINDINGS: Frontal, shallow oblique, and Y scapular images were obtained. There is a comminuted fracture of the distal clavicle with displaced fracture fragments in this area. There appears to be a degree of coracoclavicular separation without appreciable acromioclavicular separation. No frank  dislocation. No other fracture evident. There is mild generalized osteoarthritic change. Bones appear osteoporotic. There is a fairly small right pleural effusion evident. IMPRESSION: Comminuted fracture of the distal right clavicle with displacement of fracture fragments. Coracoclavicular separation noted. Mild generalized osteoarthritic change. No dislocation. Fairly small right pleural effusion. Electronically Signed   By: Lowella Grip III M.D.   On: 02/03/2017 07:54   Ct Head Wo Contrast  Result Date: 02/03/2017 CLINICAL DATA:  Patient fell out of bed. No report of loss of consciousness. Neck pain. RIGHT forehead hematoma. LEFT forehead hematoma reportedly old. EXAM: CT HEAD WITHOUT CONTRAST CT CERVICAL SPINE WITHOUT CONTRAST TECHNIQUE: Multidetector CT imaging of the head and cervical spine was performed following the standard protocol without intravenous contrast. Multiplanar CT image reconstructions of the cervical spine were also generated. COMPARISON:  CT head 01/01/2017. CT head and cervical spine 10/11/2016. FINDINGS: CT HEAD FINDINGS Brain:  No evidence of acute infarction, hemorrhage, hydrocephalus, extra-axial collection or mass lesion/mass effect. Moderate atrophy, not unexpected for age. Hypoattenuation of white matter likely small vessel disease. Vascular: Calcification of the cavernous internal carotid arteries consistent with cerebrovascular atherosclerotic disease. No signs of intracranial large vessel occlusion. Skull: Calvarium intact.  BILATERAL frontal scalp hematomas. Sinuses/Orbits: No layering sinus fluid. BILATERAL cataract extraction. Other: No mastoid fluid. CT CERVICAL SPINE FINDINGS Alignment: There is straightening, and slight reversal, of the normal cervical lordosis. 2 mm anterolisthesis C4-5, 1 mm anterolisthesis C3-4, and 1 mm retrolisthesis C6-7 all facet mediated. Skull base and vertebrae: No acute fracture. No primary bone lesion or focal pathologic process. Soft  tissues and spinal canal: No prevertebral fluid or swelling. No visible canal hematoma. Carotid atherosclerosis. Disc levels: Multilevel spondylosis, most pronounced at C5-6 and C6-7. BILATERAL cervical facet arthropathy. Upper chest: Large RIGHT pleural effusion. This appears similar to slightly smaller compared CT cervical spine from September 2018. Other: None. IMPRESSION: Atrophy and small vessel disease similar to priors. No skull fracture or intracranial hemorrhage. BILATERAL scalp hematomas, which could be related to the acute fall. Cervical spondylosis. No cervical spine fracture or traumatic subluxation. RIGHT pleural effusion, which appears chronic based on review of another cervical spine MRI from last year. This effusion however is incompletely evaluated. A chest radiograph is warranted for further evaluation. Electronically Signed   By: Staci Righter M.D.   On: 02/03/2017 07:30   Ct Cervical Spine Wo Contrast  Result Date: 02/03/2017 CLINICAL DATA:  Patient fell out of bed. No report of loss of consciousness. Neck pain. RIGHT forehead hematoma. LEFT forehead hematoma reportedly old. EXAM: CT HEAD WITHOUT CONTRAST CT CERVICAL SPINE WITHOUT CONTRAST TECHNIQUE: Multidetector CT imaging of the head and cervical spine was performed following the standard protocol without intravenous contrast. Multiplanar CT image reconstructions of the cervical spine were also generated. COMPARISON:  CT head 01/01/2017. CT head and cervical spine 10/11/2016. FINDINGS: CT HEAD FINDINGS Brain: No evidence of acute infarction, hemorrhage, hydrocephalus, extra-axial collection or mass lesion/mass effect. Moderate atrophy, not unexpected for age. Hypoattenuation of white matter likely small vessel disease. Vascular: Calcification of the cavernous internal carotid arteries consistent with cerebrovascular atherosclerotic disease. No signs of intracranial large vessel occlusion. Skull: Calvarium intact.  BILATERAL frontal scalp  hematomas. Sinuses/Orbits: No layering sinus fluid. BILATERAL cataract extraction. Other: No mastoid fluid. CT CERVICAL SPINE FINDINGS Alignment: There is straightening, and slight reversal, of the normal cervical lordosis. 2 mm anterolisthesis C4-5, 1 mm anterolisthesis C3-4, and 1 mm retrolisthesis C6-7 all facet mediated. Skull base and vertebrae: No acute fracture. No primary bone lesion or focal pathologic process. Soft tissues and spinal canal: No prevertebral fluid or swelling. No visible canal hematoma. Carotid atherosclerosis. Disc levels: Multilevel spondylosis, most pronounced at C5-6 and C6-7. BILATERAL cervical facet arthropathy. Upper chest: Large RIGHT pleural effusion. This appears similar to slightly smaller compared CT cervical spine from September 2018. Other: None. IMPRESSION: Atrophy and small vessel disease similar to priors. No skull fracture or intracranial hemorrhage. BILATERAL scalp hematomas, which could be related to the acute fall. Cervical spondylosis. No cervical spine fracture or traumatic subluxation. RIGHT pleural effusion, which appears chronic based on review of another cervical spine MRI from last year. This effusion however is incompletely evaluated. A chest radiograph is warranted for further evaluation. Electronically Signed   By: Staci Righter M.D.   On: 02/03/2017 07:30    EKG:   Orders placed or performed during the hospital encounter of 02/03/17  .  EKG 12-Lead  . EKG 12-Lead    IMPRESSION AND PLAN:   Sharon [name]  is a 84 y.o. female with a known history of end-stage renal disease on hemodialysis, coronary artery disease, cardiomyopathy, frequent falls, obstructive sleep apnea and multiple other medical problems is brought into the ED from [facility] after she sustained another fall today.  Patient was reporting right shoulder pain.  X-ray has revealed a right clavicular fracture.  ED physician has discussed patient was evaluated by orthopedics at bedside  who is not considering surgery as patient is at high risk.  ED physician has discussed with the patient and healthcare power of attorney had brother who has requested comfort care measures   #Acute right shoulder pain following a fall right clavicle fracture -# frequent falls #End-stage renal disease on hemodialysis #Coronary artery disease, history of cardiomyopathy  # adult failure to thrive  Patient has requested to discuss with her brother regarding her care who is her healthcare power of attorney.  Patient's brother is requesting to discontinue hemodialysis and comfort care measures. He is considering hospice home  Will stop life prolonging measures including hemodialysis Provide morphine as needed for pain, shortness of breath or anxiety Tylenol as needed Oxygen for shortness of breath Palliative care is consulted Social work is consulted      All the records are reviewed and case discussed with ED provider. Management plans discussed with the patient, family and they are in agreement.  CODE STATUS: dnr ,comfort care  TOTAL TIME TAKING CARE OF THIS PATIENT: 43  minutes.   Note: This dictation was prepared with Dragon dictation along with smaller phrase technology. Any transcriptional errors that result from this process are unintentional.  Nicholes Mango M.D on 02/03/2017 at 5:17 PM  Between 7am to 6pm - Pager - [phone]  After 6pm go to [email or web address] - password [email or web address] [facility]  [facility]  Office  [phone]  CC: Primary care physician; Kirk Ruths, MD

## 2017-02-03 NOTE — Clinical Social Work Note (Signed)
Clinical Social Work Assessment  Patient Details  Name: SHAWNE BULOW MRN: [id number] Date of Birth: January 22, 1927  Date of referral:  02/03/17               Reason for consult:  Facility Placement                Permission sought to share information with:  Family Supports, Customer service manager Permission granted to share information::  Yes, Verbal Permission Granted  Name::     Santina Evans  Agency::  [facility]  Relationship::  Brother/HCPOA  Contact Information:  [phone]  Housing/Transportation Living arrangements for the past 2 months:  Skilled Nursing Facility([facility]) Source of Information:  Patient, Siblings Patient Interpreter Needed:  None Criminal Activity/Legal Involvement Pertinent to Current Situation/Hospitalization:  No - Comment as needed Significant Relationships:  Other Family Members, Siblings Lives with:  Facility Resident Do you feel safe going back to the place where you live?  Yes Need for family participation in patient care:  Yes (Comment)  Care giving concerns:  Family concerned about multiple falls at [facility].   Social Worker assessment / plan:  CSW received consult for "needing higher level of care." CSW attempted to meet with pt at bedside, but she was sleeping soundly and did not respond to [place] calling her name. No family at bedside. CSW called brother/HCPOA Santina Evans at [phone] twice, but no answer and no option to leave a voicemail.   CSW spoke with Magda Paganini (Admissions) at [facility] who stated pt came to their rehab about 1 1/2 years ago and subsequently moved to [facility]. Pt then decided to go home and was there for about 3 months. Pt was continually falling at home. Pt then went to [facility] in September 2018  for rehab and stayed there for LTC and has been there ever since. Pt has experienced several falls while at the facility, as well. CSW spoke  with Helene Kelp Civil engineer, contracting of Nursing) and Claiborne Billings Civil engineer, contracting) who stated pt was very independent, but doesn't realize how weak she is when trying to get up. Claiborne Billings stated they have tried different interventions after every fall, including medication adjustment. Claiborne Billings also stated Hospice services were offered, but brother declined.   CSW met with pt, brother, sister-In-law, and grandson at bedside. Pt was agreeable to Hospice services and stated her brother had already spoken with "someone" about it. CSW explained there is not a "higher level of care" available for pt and they may want to consider a private sitter. Brother stepped out of the room to speak with Magda Paganini and CSW met him on the way back to the room. Brother became tearful and we stepped into the Family consultation room in the ED. Brother stated he spoke with Magda Paganini about setting up Hospice services, but pt is not going to be able to continue dialysis. Brother stated he without dialysis pt "only has 2 weeks to live." Brother became very tearful after this statement. CSW provided emotional support to brother. Brother asked to speak with EDP one more time before deciding.   CSW, brother, and EDP met outside of room. EDP to admit pt and add a Palliative Care Consult. Brother agreeable. Unit CSW will continue to follow for transitions of care/discharge planning.   Employment status:  Retired Forensic scientist:  Medicare PT Recommendations:  Not assessed at this time [place] / Referral to community resources:  [place], Other (Comment Required)(Hospice)  Patient/Family's Response to care:  Pt and family are agreeable to pt remaining in the hospital and are agreeable to Palliative Care consult.  Patient/Family's Understanding of and Emotional Response to Diagnosis, Current Treatment, and Prognosis: Pt and family understand pt's current medical state. Pt in good spirits. Brother very saddened and tearful as he feels this is the end of  life for pt.   Emotional Assessment Appearance:  Appears stated age Attitude/Demeanor/Rapport:  Other(Appropriate) Affect (typically observed):  Pleasant Orientation:  Oriented to Self, Oriented to Situation, Oriented to Place, Oriented to  Time Alcohol / Substance use:  Other Psych involvement (Current and /or in the community):  No (Comment)  Discharge Needs  Concerns to be addressed:  Care Coordination, Discharge Planning Concerns Readmission within the last 30 days:  No Current discharge risk:  Chronically ill Barriers to Discharge:  Continued Medical Work up   [employer], LCSW 02/03/2017, 1:41 PM

## 2017-02-04 DIAGNOSIS — R296 Repeated falls: Secondary | ICD-10-CM

## 2017-02-04 DIAGNOSIS — S42031A Displaced fracture of lateral end of right clavicle, initial encounter for closed fracture: Principal | ICD-10-CM

## 2017-02-04 DIAGNOSIS — N186 End stage renal disease: Secondary | ICD-10-CM

## 2017-02-04 DIAGNOSIS — Z992 Dependence on renal dialysis: Secondary | ICD-10-CM

## 2017-02-04 DIAGNOSIS — Z7189 Other specified counseling: Secondary | ICD-10-CM

## 2017-02-04 DIAGNOSIS — I120 Hypertensive chronic kidney disease with stage 5 chronic kidney disease or end stage renal disease: Secondary | ICD-10-CM | POA: Diagnosis not present

## 2017-02-04 DIAGNOSIS — Z515 Encounter for palliative care: Secondary | ICD-10-CM | POA: Diagnosis not present

## 2017-02-04 DIAGNOSIS — R627 Adult failure to thrive: Secondary | ICD-10-CM

## 2017-02-04 MED ORDER — LEVOTHYROXINE SODIUM 25 MCG PO TABS: 25.0000 ug | ORAL_TABLET | Freq: Every day | ORAL | Status: DC

## 2017-02-04 NOTE — Consult Note (Signed)
Consultation Note Date: 02/04/2017   Patient [name]  DOB: 05-03-1926  MRN: [id number]  Age / Sex: 84 y.o., female  PCP: Kirk Ruths, MD Referring Physician: Bettey Costa, MD  Reason for Consultation: Establishing goals of care  HPI/Patient Profile: Sharon [name]  is a 84 y.o. female with a known history of end-stage renal disease on hemodialysis, coronary artery disease, cardiomyopathy, frequent falls with fractures, obstructive sleep apnea and multiple other medical problems is brought into the ED from [facility] after she sustained another fall.      Clinical Assessment and Goals of Care: Sharon [name] is resting in bed. She is eating breakfast. She tells me her brother is her POA, her husband died in May 24, 2010 and she has no children. She states she has been to the hospital frequently and has had frequent falls. She states she and her brother often do not see eye to eye and have differences of opinion regarding her health though she states she understands that he is trying to do what he feels is in her best interest. She states this is one of those times they will agree to disagree, as she wants to continue care as she has been and does not want to be made comfort care,she states she is not ready for that yet.   Per notes, family would like comfort care. We discussed diagnosis, prognosis, GOC, EOL wishes disposition and options.  A detailed discussion was had today regarding advanced directives.  Concepts specific to code status, artifical feeding and hydration,  IV antibiotics and rehospitalization was had.  The difference between an aggressive medical intervention path and a hospice comfort care path for this patient at this time was discussed.  Values and goals of care important to patient and family were attempted to be elicited.  She states she wants to live as long as possible. She  states if she stops dialysis she will die and she has no intentions of doing that right now. She does say she wants DNR/DNI status as she would not want people standing over her bed trying to decide when to stop the machine, and once she's gone she does not want efforts to try to bring her back.   She states as long as she has an acceptable quality of life she is okay with continuing current treatment and coming to the hospital. She states an acceptable QOL means being mentally functional. She is unsure of the physical limitations she is willing to live with as she states there are people who are unable to move their extremities such as quadriplegics that have an acceptable QOL to them.   Brother and POA Sharon [name] is at bedside. Dr. Juleen China and myself in to speak with patient and brother at bedside. Patient confirms wishes and goal to continue HD.      SUMMARY OF RECOMMENDATIONS    Recommend palliative to follow outpatient for support.    Code Status/Advance Care Planning:  DNR   Palliative Prophylaxis:   Oral Care  Prognosis:   Unable to determine Frequent falls and fractures. HD   Discharge Planning: Return to facility with recommendation for palliative to follow.      Primary Diagnoses: Present on Admission: . FTT (failure to thrive) in adult   I have reviewed the medical record, interviewed the patient and family, and examined the patient. The following aspects are pertinent.  Past Medical History:  Diagnosis Date  . Anemia   . Arthritis   . CAD (coronary artery disease)   . Cancer ([place])    skin  . Cardiomyopathy ([place])   . CHF (congestive heart failure) ([place])   . Depression   . IBS (irritable bowel syndrome) 2010  . Kidney failure July 2012   Hemodialysis 3xweek  . Kidney failure   . Meniere disease   . Meniere's disease   . Myocardial infarction ([place])   . OSA (obstructive sleep apnea)    CPAP  . Peritoneal dialysis status ([place])   . Presence of permanent  cardiac pacemaker   . Renal insufficiency    Social History   Socioeconomic History  . Marital status: Widowed    Spouse name: None  . Number of children: 0  . Years of education: None  . Highest education level: None  Social Needs  . Financial resource strain: None  . Food insecurity - worry: None  . Food insecurity - inability: None  . Transportation needs - medical: None  . Transportation needs - non-medical: None  Occupational History  . Occupation: Retired   Tobacco Use  . Smoking status: Former Smoker    Last attempt to quit: 03/31/1948    Years since quitting: 68.8  . Smokeless tobacco: Never Used  Substance and Sexual Activity  . Alcohol use: No  . Drug use: No  . Sexual activity: No  Other Topics Concern  . None  Social History Narrative   Lives in [place] alone. Widow 2012.      Regular Exercise -  Housework   Daily Caffeine Use:  1 - 2 cups coffee            Family History  Problem Relation Age of Onset  . Cancer Mother        ? ovarian - sarcoma  . Cancer Father        Skin cancer  . Diabetes Sister   . COPD Sister   . Depression Sister   . Cancer Sister        Lung - 84 yrs old   Scheduled Meds: Continuous Infusions: PRN Meds:.acetaminophen **OR** acetaminophen, antiseptic oral rinse, fentaNYL (SUBLIMAZE) injection, glycopyrrolate **OR** glycopyrrolate **OR** glycopyrrolate, haloperidol **OR** haloperidol **OR** haloperidol lactate, morphine injection, ondansetron **OR** ondansetron (ZOFRAN) IV, polyvinyl alcohol Medications Prior to Admission:  Prior to Admission medications   Medication Sig Start Date End Date Taking? Authorizing Provider  folic acid-vitamin b complex-vitamin c-selenium-zinc (DIALYVITE) 3 MG TABS tablet Take 1 tablet by mouth daily.   Yes [provider]  levothyroxine (SYNTHROID, LEVOTHROID) 25 MCG tablet Take 1 tablet (25 mcg total) by mouth daily. Patient taking differently: Take 25 mcg by mouth daily. (0800)  10/25/15  Yes [name], MD  metoprolol succinate (TOPROL XL) 25 MG 24 hr tablet Take 1 tablet (25 mg total) by mouth daily. 01/08/17  Yes Wieting, Richard, MD  polyethylene glycol (MIRALAX / GLYCOLAX) packet Take 17 g by mouth daily as needed for mild constipation. 01/08/17  Yes Wieting, Richard, MD  ramipril (ALTACE) 2.5 MG capsule Take 1 capsule (  2.5 mg total) by mouth at bedtime. 01/08/17  Yes Wieting, Richard, MD  sertraline (ZOLOFT) 50 MG tablet TAKE 3 TABLETS(150 MG) BY MOUTH DAILY 08/30/15  Yes [name], MD  Vitamin D, Ergocalciferol, (DRISDOL) 50000 units CAPS capsule Take 50,000 Units by mouth every Monday. (0900) 04/17/15  Yes [provider]  acetaminophen (TYLENOL) 325 MG tablet Take 2 tablets (650 mg total) by mouth every 4 (four) hours as needed for mild pain or moderate pain. 05/14/16   Loletha Grayer, MD  diphenoxylate-atropine (LOMOTIL) 2.5-0.025 MG tablet TAKE 1 TABLET BY MOUTH FOUR TIMES DAILY AS NEEDED FOR DIARRHEA OR LOOSE STOOLS Patient taking differently: TAKE 1 TABLET BY MOUTH every 6 hours AS NEEDED FOR DIARRHEA OR LOOSE STOOLS 08/30/15   [name], MD  lidocaine-prilocaine (EMLA) cream Apply 1 application topically every Monday, Wednesday, and Friday with hemodialysis. (1100) apply to right arm dialysis access pre-dialysis wrap with saran wrap after applying.    [provider]  metoCLOPramide (REGLAN) 5 MG tablet Take 1 tablet (5 mg total) by mouth every 8 (eight) hours as needed (vertigo). 05/30/16   Menshew, Dannielle Karvonen, PA-C  Nutritional Supplements (FEEDING SUPPLEMENT, NEPRO CARB STEADY,) LIQD Take 237 mLs by mouth 2 (two) times daily between meals. 01/08/17   Loletha Grayer, MD  traMADol (ULTRAM) 50 MG tablet Take 1 tablet (50 mg total) by mouth every 12 (twelve) hours as needed for moderate pain or severe pain. Patient not taking: Reported on 01/28/2017 01/08/17   Loletha Grayer, MD   Allergies  Allergen Reactions  . Statins  Other (See Comments)    Muscle weakness Muscle weakness severe  . Codeine Sulfate Nausea And Vomiting  . Codeine Other (See Comments)    GI UPSET  . Effexor [Venlafaxine] Nausea Only  . Ezetimibe-Simvastatin Other (See Comments)    Muscle weakness   Review of Systems  All other systems reviewed and are negative.   Physical Exam  Constitutional: No distress.  HENT:  Bruise to face.   Pulmonary/Chest: Effort normal.  Musculoskeletal:  Right arm with splinting  Neurological: She is alert.  Oriented  Skin: Skin is warm and dry.    Vital Signs: BP (!) 141/48 (BP Location: Left Arm)   Pulse 62   Temp 97.8 F (36.6 C) (Oral)   Resp 15   Ht 5\' 4"  (1.626 m)   Wt 44.5 kg (98 lb)   SpO2 97%   BMI 16.82 kg/m  Pain Assessment: 0-10   Pain Score: Asleep   SpO2: SpO2: 97 % O2 Device:SpO2: 97 % O2 Flow Rate: .   IO: Intake/output summary:   Intake/Output Summary (Last 24 hours) at 02/04/2017 1017 Last data filed at 02/03/2017 1534 Gross per 24 hour  Intake 300 ml  Output -  Net 300 ml    LBM: Last BM Date: 02/03/17 Baseline Weight: Weight: 44.5 kg (98 lb) Most recent weight: Weight: 44.5 kg (98 lb)     Palliative Assessment/Data: 50%     Time In: 9:00 Time Out: 11:00 Time Total: 2 hours Greater than 50%  of this time was spent counseling and coordinating care related to the above assessment and plan.  Signed by: Asencion Gowda, NP   Please contact Palliative Medicine Team phone at [phone] for questions and concerns.  For individual provider: See Shea Evans

## 2017-02-04 NOTE — Progress Notes (Signed)
Pt prepared for d/c to SNF. IV d/c'd. Skin intact except as charted in most recent assessments. Vitals are stable. Report called to receiving facility by susan kimmel, RN. Pt to be transported by ambulance service.  Kateena Degroote [employer]

## 2017-02-04 NOTE — Progress Notes (Signed)
[facility]  ROUNDING NOTE   Subjective:   Patient wants to continue hemodialysis. Missed treatment yesterday.   Discussed with family and palliative care.   Objective:  Vital signs in last 24 hours:  Temp:  [98 F (36.7 C)] 98 F (36.7 C) (01/10 1155) Pulse Rate:  [65] 65 (01/10 1155) BP: (156)/(50) 156/50 (01/10 1155) SpO2:  [96 %] 96 % (01/10 1155)  Weight change:  Filed Weights   02/03/17 0603  Weight: 44.5 kg (98 lb)    Intake/Output: I/O last 3 completed shifts: In: 300 [P.O.:300] Out: -    Intake/Output this shift:  No intake/output data recorded.  Physical Exam: General: NAD,   Head: Normocephalic, atraumatic. Moist oral mucosal membranes  Eyes: Anicteric, PERRL  Neck: Supple, trachea midline  Lungs:  Clear to auscultation  Heart: Regular rate and rhythm  Abdomen:  Soft, nontender,   Extremities: Right UE immobilized.   Neurologic: Alert x4,   Skin: No lesions  Access: Left IJ permcath    Basic Metabolic Panel: Recent Labs  Lab 02/03/17 0616  NA 134*  K 3.4*  CL 98*  CO2 22  GLUCOSE 112*  BUN 50*  CREATININE 3.70*  CALCIUM 9.0    Liver Function Tests: No results for input(s): AST, ALT, ALKPHOS, BILITOT, PROT, ALBUMIN in the last 168 hours. No results for input(s): LIPASE, AMYLASE in the last 168 hours. No results for input(s): AMMONIA in the last 168 hours.  CBC: Recent Labs  Lab 02/03/17 0616  WBC 7.7  NEUTROABS 5.5  HGB 9.0*  HCT 27.6*  MCV 99.4  PLT 240    Cardiac Enzymes: No results for input(s): CKTOTAL, CKMB, CKMBINDEX, TROPONINI in the last 168 hours.  BNP: Invalid input(s): POCBNP  CBG: No results for input(s): GLUCAP in the last 168 hours.  Microbiology: Results for orders placed or performed during the hospital encounter of 02/03/17  MRSA PCR Screening     Status: None   Collection Time: 02/03/17  6:24 PM  Result Value Ref Range Status   MRSA by PCR NEGATIVE NEGATIVE Final    Comment:         The GeneXpert MRSA Assay (FDA approved for NASAL specimens only), is one component of a comprehensive MRSA colonization surveillance program. It is not intended to diagnose MRSA infection nor to guide or monitor treatment for MRSA infections. Performed at [facility], [address] [place], [place] [address]     Coagulation Studies: Recent Labs    02/03/17 2297  LABPROT 14.3  INR 1.12    Urinalysis: Recent Labs    02/03/17 0815  COLORURINE YELLOW*  LABSPEC 1.014  PHURINE 5.0  GLUCOSEU NEGATIVE  HGBUR NEGATIVE  BILIRUBINUR NEGATIVE  KETONESUR NEGATIVE  PROTEINUR 100*  NITRITE NEGATIVE  LEUKOCYTESUR NEGATIVE      Imaging: Dg Shoulder Right  Result Date: 02/03/2017 CLINICAL DATA:  Pain following fall EXAM: RIGHT SHOULDER - 2+ VIEW COMPARISON:  None. FINDINGS: Frontal, shallow oblique, and Y scapular images were obtained. There is a comminuted fracture of the distal clavicle with displaced fracture fragments in this area. There appears to be a degree of coracoclavicular separation without appreciable acromioclavicular separation. No frank dislocation. No other fracture evident. There is mild generalized osteoarthritic change. Bones appear osteoporotic. There is a fairly small right pleural effusion evident. IMPRESSION: Comminuted fracture of the distal right clavicle with displacement of fracture fragments. Coracoclavicular separation noted. Mild generalized osteoarthritic change. No dislocation. Fairly small right pleural effusion. Electronically Signed  By: Lowella Grip III M.D.   On: 02/03/2017 07:54   Ct Head Wo Contrast  Result Date: 02/03/2017 CLINICAL DATA:  Patient fell out of bed. No report of loss of consciousness. Neck pain. RIGHT forehead hematoma. LEFT forehead hematoma reportedly old. EXAM: CT HEAD WITHOUT CONTRAST CT CERVICAL SPINE WITHOUT CONTRAST TECHNIQUE: Multidetector CT imaging of the head and cervical spine was performed following the  standard protocol without intravenous contrast. Multiplanar CT image reconstructions of the cervical spine were also generated. COMPARISON:  CT head 01/01/2017. CT head and cervical spine 10/11/2016. FINDINGS: CT HEAD FINDINGS Brain: No evidence of acute infarction, hemorrhage, hydrocephalus, extra-axial collection or mass lesion/mass effect. Moderate atrophy, not unexpected for age. Hypoattenuation of white matter likely small vessel disease. Vascular: Calcification of the cavernous internal carotid arteries consistent with cerebrovascular atherosclerotic disease. No signs of intracranial large vessel occlusion. Skull: Calvarium intact.  BILATERAL frontal scalp hematomas. Sinuses/Orbits: No layering sinus fluid. BILATERAL cataract extraction. Other: No mastoid fluid. CT CERVICAL SPINE FINDINGS Alignment: There is straightening, and slight reversal, of the normal cervical lordosis. 2 mm anterolisthesis C4-5, 1 mm anterolisthesis C3-4, and 1 mm retrolisthesis C6-7 all facet mediated. Skull base and vertebrae: No acute fracture. No primary bone lesion or focal pathologic process. Soft tissues and spinal canal: No prevertebral fluid or swelling. No visible canal hematoma. Carotid atherosclerosis. Disc levels: Multilevel spondylosis, most pronounced at C5-6 and C6-7. BILATERAL cervical facet arthropathy. Upper chest: Large RIGHT pleural effusion. This appears similar to slightly smaller compared CT cervical spine from September 2018. Other: None. IMPRESSION: Atrophy and small vessel disease similar to priors. No skull fracture or intracranial hemorrhage. BILATERAL scalp hematomas, which could be related to the acute fall. Cervical spondylosis. No cervical spine fracture or traumatic subluxation. RIGHT pleural effusion, which appears chronic based on review of another cervical spine MRI from last year. This effusion however is incompletely evaluated. A chest radiograph is warranted for further evaluation. Electronically  Signed   By: Staci Righter M.D.   On: 02/03/2017 07:30   Ct Cervical Spine Wo Contrast  Result Date: 02/03/2017 CLINICAL DATA:  Patient fell out of bed. No report of loss of consciousness. Neck pain. RIGHT forehead hematoma. LEFT forehead hematoma reportedly old. EXAM: CT HEAD WITHOUT CONTRAST CT CERVICAL SPINE WITHOUT CONTRAST TECHNIQUE: Multidetector CT imaging of the head and cervical spine was performed following the standard protocol without intravenous contrast. Multiplanar CT image reconstructions of the cervical spine were also generated. COMPARISON:  CT head 01/01/2017. CT head and cervical spine 10/11/2016. FINDINGS: CT HEAD FINDINGS Brain: No evidence of acute infarction, hemorrhage, hydrocephalus, extra-axial collection or mass lesion/mass effect. Moderate atrophy, not unexpected for age. Hypoattenuation of white matter likely small vessel disease. Vascular: Calcification of the cavernous internal carotid arteries consistent with cerebrovascular atherosclerotic disease. No signs of intracranial large vessel occlusion. Skull: Calvarium intact.  BILATERAL frontal scalp hematomas. Sinuses/Orbits: No layering sinus fluid. BILATERAL cataract extraction. Other: No mastoid fluid. CT CERVICAL SPINE FINDINGS Alignment: There is straightening, and slight reversal, of the normal cervical lordosis. 2 mm anterolisthesis C4-5, 1 mm anterolisthesis C3-4, and 1 mm retrolisthesis C6-7 all facet mediated. Skull base and vertebrae: No acute fracture. No primary bone lesion or focal pathologic process. Soft tissues and spinal canal: No prevertebral fluid or swelling. No visible canal hematoma. Carotid atherosclerosis. Disc levels: Multilevel spondylosis, most pronounced at C5-6 and C6-7. BILATERAL cervical facet arthropathy. Upper chest: Large RIGHT pleural effusion. This appears similar to slightly smaller compared CT cervical spine from  September 2018. Other: None. IMPRESSION: Atrophy and small vessel disease similar  to priors. No skull fracture or intracranial hemorrhage. BILATERAL scalp hematomas, which could be related to the acute fall. Cervical spondylosis. No cervical spine fracture or traumatic subluxation. RIGHT pleural effusion, which appears chronic based on review of another cervical spine MRI from last year. This effusion however is incompletely evaluated. A chest radiograph is warranted for further evaluation. Electronically Signed   By: Staci Righter M.D.   On: 02/03/2017 07:30     Medications:     acetaminophen **OR** acetaminophen, antiseptic oral rinse, fentaNYL (SUBLIMAZE) injection, glycopyrrolate **OR** glycopyrrolate **OR** glycopyrrolate, haloperidol **OR** haloperidol **OR** haloperidol lactate, morphine injection, ondansetron **OR** ondansetron (ZOFRAN) IV, polyvinyl alcohol  Assessment/ Plan:  [name] is a 84 y.o. white female with end stage renal disease on hemodialysis, coronary artery disease, pacemaker placement, meniere's disease, congestive heart failure, peripheral vascular disease, multiple falls and fractures.   MWF [facility]   1. End Stage Renal Disease: on hemodialysis. Resume MWF schedule After long discussion with patient and family. Patient is alert and oriented. She is willing to resume dialysis. She is not ready for hospice services.   2. Hypertension: blood pressure at goal. Holding home blood pressure medications.  - ramipril, metoprolol  3. Anemia of chronic kidney disease: hemoglobin 9 - EPO with HD treatments  4. Secondary Hyperparathyroidism: outpatient PTH, calcium and phosphorus at goal. Not currently on binders.    LOS: 1 Sharon [name] 1/10/20193:27 PM

## 2017-02-04 NOTE — Clinical Social Work Note (Signed)
CSW met with patient's brother, Palliative Care, and nephrology this morning. Patient is going to continue with aggressive care and continue to dialysis. Patient is to return to today to [facility]. Discharge information sent. Nurse to call report then call EMS. Family is aware. Shela Leff MSW,LCSW [phone]

## 2017-02-04 NOTE — Care Management (Signed)
Sharon [name] HD liaison notified of diacharge

## 2017-02-04 NOTE — NC FL2 (Signed)
[place] LEVEL OF CARE SCREENING TOOL     IDENTIFICATION  Patient [name] Birthdate: 1926-12-14 Sex: female Admission Date (Current Location): 02/03/2017  [facility] and [place] Number:  Engineering geologist and Address:  [facility], [address], [place],  [address]      Provider Number: [id number]  Attending Physician Name and Address:  Bettey Costa, MD  Relative Name and Phone Number:       Current Level of Care: Hospital Recommended Level of Care: [place] Prior Approval Number:    Date Approved/Denied:   PASRR Number:    Discharge Plan: SNF    Current Diagnoses: Patient Active Problem List   Diagnosis Date Noted  . FTT (failure to thrive) in adult 02/03/2017  . ESRD on hemodialysis ([place])   . Pleural effusion on right   . Palliative care by specialist   . Pulmonary embolism ([place]) 01/02/2017  . Syncope 01/01/2017  . Vision changes 08/27/2016  . Closed fracture of neck of femur ([place]) 05/26/2016  . Hip fracture ([place]) 05/10/2016  . Falls 04/27/2016  . Head injury 04/27/2016  . Age-related osteoporosis with current pathological fracture with routine healing 04/24/2016  . Recurrent falls 04/24/2016  . Mild protein-calorie malnutrition ([place]) 04/24/2016  . ESRD on dialysis ([place]) 04/10/2016  . Periprosthetic fracture around internal prosthetic left knee joint 01/26/2016  . Pressure injury of skin 12/15/2015  . Closed left subtrochanteric femur fracture ([place]) 12/15/2015  . Femur fracture, left ([place]) 12/14/2015  . Meniere disease   . Loss of weight 10/21/2015  . Clinical depression 06/20/2015  . Dysphagia, unspecified 05/24/2015  . Imbalance 04/22/2015  . Chronic pain syndrome 05/22/2014  . Insomnia 03/13/2014  . Anxiety state 03/13/2014  . Chronic systolic CHF (congestive heart failure), NYHA class 3 ([place]) 01/15/2014  . OSA (obstructive sleep apnea) 01/15/2014  . Chronic kidney disease  (CKD), stage V ([place]) 01/15/2014  . Basal cell carcinoma of neck 11/14/2013  . DDD (degenerative disc disease), cervical 11/07/2013  . Cervical radiculitis 10/16/2013  . Benign essential hypertension 09/12/2013  . Memory loss 09/12/2013  . Systolic heart failure, chronic ([place]) 05/12/2013  . Goals of care, counseling/discussion 11/10/2012  . Sinus node dysfunction ([place]) 08/01/2012  . Low back pain 08/01/2012  . Cervical spine pain 05/02/2012  . Irritable bowel syndrome 11/02/2011  . Depression 04/01/2011  . Hypertension 04/01/2011    Orientation RESPIRATION BLADDER Height & Weight     Self, Time, Situation, Place  Normal Incontinent Weight: 98 lb (44.5 kg) Height:  5\' 4"  (162.6 cm)  BEHAVIORAL SYMPTOMS/MOOD NEUROLOGICAL BOWEL NUTRITION STATUS  (none) (none) Continent Diet(regular)  AMBULATORY STATUS COMMUNICATION OF NEEDS Skin   Total Care Verbally Bruising                       Personal Care Assistance Level of Assistance  Bathing, Feeding, Dressing Bathing Assistance: Maximum assistance Feeding assistance: Maximum assistance Dressing Assistance: Maximum assistance     Functional Limitations Info  Hearing          SPECIAL CARE FACTORS FREQUENCY  PT (By licensed PT)                    Contractures Contractures Info: Not present    Additional Factors Info  Code Status Code Status Info: dnr             Current Medications (02/04/2017):  This is the current hospital active medication  list Current Facility-Administered Medications  Medication Dose Route Frequency Provider Last Rate Last Dose  . acetaminophen (TYLENOL) tablet 650 mg  650 mg Oral Q6H PRN Gouru, Aruna, MD       Or  . acetaminophen (TYLENOL) suppository 650 mg  650 mg Rectal Q6H PRN Gouru, Aruna, MD      . antiseptic oral rinse (BIOTENE) solution 15 mL  15 mL Topical PRN Gouru, Aruna, MD      . fentaNYL (SUBLIMAZE) injection 12.5 mcg  12.5 mcg Intravenous Q1H PRN Merlyn Lot, MD    12.5 mcg at 02/03/17 1645  . glycopyrrolate (ROBINUL) tablet 1 mg  1 mg Oral Q4H PRN Gouru, Aruna, MD       Or  . glycopyrrolate (ROBINUL) injection 0.2 mg  0.2 mg Subcutaneous Q4H PRN Gouru, Aruna, MD       Or  . glycopyrrolate (ROBINUL) injection 0.2 mg  0.2 mg Intravenous Q4H PRN Gouru, Aruna, MD      . haloperidol (HALDOL) tablet 0.5 mg  0.5 mg Oral Q4H PRN Gouru, Aruna, MD       Or  . haloperidol (HALDOL) 2 MG/ML solution 0.5 mg  0.5 mg Sublingual Q4H PRN Gouru, Aruna, MD       Or  . haloperidol lactate (HALDOL) injection 0.5 mg  0.5 mg Intravenous Q4H PRN Gouru, Aruna, MD      . morphine 2 MG/ML injection 2 mg  2 mg Intravenous Q4H PRN Gouru, Aruna, MD   2 mg at 02/04/17 0352  . ondansetron (ZOFRAN-ODT) disintegrating tablet 4 mg  4 mg Oral Q6H PRN Gouru, Aruna, MD       Or  . ondansetron (ZOFRAN) injection 4 mg  4 mg Intravenous Q6H PRN Gouru, Aruna, MD      . polyvinyl alcohol (LIQUIFILM TEARS) 1.4 % ophthalmic solution 1 drop  1 drop Both Eyes QID PRN Gouru, Aruna, MD         Discharge Medications: Please see discharge summary for a list of discharge medications.  Relevant Imaging Results:  Relevant Lab Results:   Additional Information Hemodialysis  Shela Leff, LCSW

## 2017-02-04 NOTE — Discharge Summary (Addendum)
[name]    MR#:  [id number]  DATE OF BIRTH:  [birth year]  DATE OF ADMISSION:  02/03/2017 ADMITTING PHYSICIAN: Sharon Mango, MD  DATE OF DISCHARGE: 02/04/2017  PRIMARY CARE PHYSICIAN: Sharon Ruths, MD    ADMISSION DIAGNOSIS:  Fall  DISCHARGE DIAGNOSIS:  Comminuted fracture of the distal right clavicle with displacement of fracture fragments  esrd on HD  SECONDARY DIAGNOSIS:   Past Medical History:  Diagnosis Date  . Anemia   . Arthritis   . CAD (coronary artery disease)   . Cancer ([place])    skin  . Cardiomyopathy ([place])   . CHF (congestive heart failure) ([place])   . Depression   . IBS (irritable bowel syndrome) 2010  . Kidney failure July 2012   Hemodialysis 3xweek  . Kidney failure   . Meniere disease   . Meniere's disease   . Myocardial infarction ([place])   . OSA (obstructive sleep apnea)    CPAP  . Peritoneal dialysis status ([place])   . Presence of permanent cardiac pacemaker   . Renal insufficiency     HOSPITAL COURSE:  84 year old female from skilled nursing facility with end-stage renal disease on hemodialysis, CAD and frequent falls who presented to the emergency room after fall. Initially the patient was brought in for comfort care measures.  1. Acute right shoulder pain after mechanical fall with right clavicle fracture: Patient is awake and alert. She does not want comfort care measures. Case was discussed by ED physician to orthopedic surgery who said patient is not a candidate for surgery. She will continue shoulder sling and pain medications as needed. She will follow up with outpatient orthopedic surgery.  2. End-stage renal disease on hemodialysis: Patient would like to continue dialysis. She is not on comfort care measures.  3. History of CAD with cardiac map the: Patient continue patient medications  4. OSA: Patient is on CPAP  5. Essential hypertension: Continue  metoprolol and ramipril   Palliative care should follow patient as an outpatient.  DISCHARGE CONDITIONS AND DIET:   Stable for discharge on renal diet  CONSULTS OBTAINED:    DRUG ALLERGIES:   Allergies  Allergen Reactions  . Statins Other (See Comments)    Muscle weakness Muscle weakness severe  . Codeine Sulfate Nausea And Vomiting  . Codeine Other (See Comments)    GI UPSET  . Effexor [Venlafaxine] Nausea Only  . Ezetimibe-Simvastatin Other (See Comments)    Muscle weakness    DISCHARGE MEDICATIONS:   Allergies as of 02/04/2017      Reactions   Statins Other (See Comments)   Muscle weakness Muscle weakness severe   Codeine Sulfate Nausea And Vomiting   Codeine Other (See Comments)   GI UPSET   Effexor [venlafaxine] Nausea Only   Ezetimibe-simvastatin Other (See Comments)   Muscle weakness      Medication List    STOP taking these medications   traMADol 50 MG tablet Commonly known as:  ULTRAM     TAKE these medications   acetaminophen 325 MG tablet Commonly known as:  TYLENOL Take 2 tablets (650 mg total) by mouth every 4 (four) hours as needed for mild pain or moderate pain.   diphenoxylate-atropine 2.5-0.025 MG tablet Commonly known as:  LOMOTIL TAKE 1 TABLET BY MOUTH FOUR TIMES DAILY AS NEEDED FOR DIARRHEA OR LOOSE STOOLS What changed:  See the new instructions.   feeding supplement (NEPRO CARB STEADY) Liqd Take  237 mLs by mouth 2 (two) times daily between meals.   folic acid-vitamin b complex-vitamin c-selenium-zinc 3 MG Tabs tablet Take 1 tablet by mouth daily.   levothyroxine 25 MCG tablet Commonly known as:  SYNTHROID, LEVOTHROID Take 1 tablet (25 mcg total) by mouth daily. What changed:  additional instructions   lidocaine-prilocaine cream Commonly known as:  EMLA Apply 1 application topically every Monday, Wednesday, and Friday with hemodialysis. (1100) apply to right arm dialysis access pre-dialysis wrap with saran wrap after  applying.   metoCLOPramide 5 MG tablet Commonly known as:  REGLAN Take 1 tablet (5 mg total) by mouth every 8 (eight) hours as needed (vertigo).   metoprolol succinate 25 MG 24 hr tablet Commonly known as:  TOPROL XL Take 1 tablet (25 mg total) by mouth daily.   polyethylene glycol packet Commonly known as:  MIRALAX / GLYCOLAX Take 17 g by mouth daily as needed for mild constipation.   ramipril 2.5 MG capsule Commonly known as:  ALTACE Take 1 capsule (2.5 mg total) by mouth at bedtime.   sertraline 50 MG tablet Commonly known as:  ZOLOFT TAKE 3 TABLETS(150 MG) BY MOUTH DAILY   Vitamin D (Ergocalciferol) 50000 units Caps capsule Commonly known as:  DRISDOL Take 50,000 Units by mouth every Monday. (0900)         Today   CHIEF COMPLAINT:   Patient is awake and alert. She wants to continue with dialysis.   VITAL SIGNS:  Blood pressure (!) 156/50, pulse 65, temperature 98 F (36.7 C), resp. rate 15, height 5\' 4"  (1.626 m), weight 44.5 kg (98 lb), SpO2 96 %.   REVIEW OF SYSTEMS:  Review of Systems  Constitutional: Negative.  Negative for chills, fever and malaise/fatigue.  HENT: Negative.  Negative for ear discharge, ear pain, hearing loss, nosebleeds and sore throat.   Eyes: Negative.  Negative for blurred vision and pain.  Respiratory: Negative.  Negative for cough, hemoptysis, shortness of breath and wheezing.   Cardiovascular: Negative.  Negative for chest pain, palpitations and leg swelling.  Gastrointestinal: Negative.  Negative for abdominal pain, blood in stool, diarrhea, nausea and vomiting.  Genitourinary: Negative.  Negative for dysuria.  Musculoskeletal: Negative.  Negative for back pain.  Skin: Negative.   Neurological: Negative for dizziness, tremors, speech change, focal weakness, seizures and headaches.  Endo/Heme/Allergies: Negative.  Does not bruise/bleed easily.  Psychiatric/Behavioral: Negative.  Negative for depression, hallucinations and  suicidal ideas.     PHYSICAL EXAMINATION:  GENERAL:  84 y.o.-year-old patient lying in the bed with no acute distress.  NECK:  Supple, no jugular venous distention. No thyroid enlargement, no tenderness.  LUNGS: Normal breath sounds bilaterally, no wheezing, rales,rhonchi  No use of accessory muscles of respiration.  CARDIOVASCULAR: S1, S2 normal. No murmurs, rubs, or gallops.  ABDOMEN: Soft, non-tender, non-distended. Bowel sounds present. No organomegaly or mass.  EXTREMITIES: No pedal edema, cyanosis, or clubbing.  PSYCHIATRIC: The patient is alert and oriented x 3.  SKIN: She has a bruise on her forehead  DATA REVIEW:   CBC Recent Labs  Lab 02/03/17 0616  WBC 7.7  HGB 9.0*  HCT 27.6*  PLT 240    Chemistries  Recent Labs  Lab 02/03/17 0616  NA 134*  K 3.4*  CL 98*  CO2 22  GLUCOSE 112*  BUN 50*  CREATININE 3.70*  CALCIUM 9.0    Cardiac Enzymes No results for input(s): TROPONINI in the last 168 hours.  Microbiology Results  @MICRORSLT48 @  RADIOLOGY:  Dg Shoulder Right  Result Date: 02/03/2017 CLINICAL DATA:  Pain following fall EXAM: RIGHT SHOULDER - 2+ VIEW COMPARISON:  None. FINDINGS: Frontal, shallow oblique, and Y scapular images were obtained. There is a comminuted fracture of the distal clavicle with displaced fracture fragments in this area. There appears to be a degree of coracoclavicular separation without appreciable acromioclavicular separation. No frank dislocation. No other fracture evident. There is mild generalized osteoarthritic change. Bones appear osteoporotic. There is a fairly small right pleural effusion evident. IMPRESSION: Comminuted fracture of the distal right clavicle with displacement of fracture fragments. Coracoclavicular separation noted. Mild generalized osteoarthritic change. No dislocation. Fairly small right pleural effusion. Electronically Signed   By: Lowella Grip III M.D.   On: 02/03/2017 07:54   Ct Head Wo  Contrast  Result Date: 02/03/2017 CLINICAL DATA:  Patient fell out of bed. No report of loss of consciousness. Neck pain. RIGHT forehead hematoma. LEFT forehead hematoma reportedly old. EXAM: CT HEAD WITHOUT CONTRAST CT CERVICAL SPINE WITHOUT CONTRAST TECHNIQUE: Multidetector CT imaging of the head and cervical spine was performed following the standard protocol without intravenous contrast. Multiplanar CT image reconstructions of the cervical spine were also generated. COMPARISON:  CT head 01/01/2017. CT head and cervical spine 10/11/2016. FINDINGS: CT HEAD FINDINGS Brain: No evidence of acute infarction, hemorrhage, hydrocephalus, extra-axial collection or mass lesion/mass effect. Moderate atrophy, not unexpected for age. Hypoattenuation of white matter likely small vessel disease. Vascular: Calcification of the cavernous internal carotid arteries consistent with cerebrovascular atherosclerotic disease. No signs of intracranial large vessel occlusion. Skull: Calvarium intact.  BILATERAL frontal scalp hematomas. Sinuses/Orbits: No layering sinus fluid. BILATERAL cataract extraction. Other: No mastoid fluid. CT CERVICAL SPINE FINDINGS Alignment: There is straightening, and slight reversal, of the normal cervical lordosis. 2 mm anterolisthesis C4-5, 1 mm anterolisthesis C3-4, and 1 mm retrolisthesis C6-7 all facet mediated. Skull base and vertebrae: No acute fracture. No primary bone lesion or focal pathologic process. Soft tissues and spinal canal: No prevertebral fluid or swelling. No visible canal hematoma. Carotid atherosclerosis. Disc levels: Multilevel spondylosis, most pronounced at C5-6 and C6-7. BILATERAL cervical facet arthropathy. Upper chest: Large RIGHT pleural effusion. This appears similar to slightly smaller compared CT cervical spine from September 2018. Other: None. IMPRESSION: Atrophy and small vessel disease similar to priors. No skull fracture or intracranial hemorrhage. BILATERAL scalp  hematomas, which could be related to the acute fall. Cervical spondylosis. No cervical spine fracture or traumatic subluxation. RIGHT pleural effusion, which appears chronic based on review of another cervical spine MRI from last year. This effusion however is incompletely evaluated. A chest radiograph is warranted for further evaluation. Electronically Signed   By: Staci Righter M.D.   On: 02/03/2017 07:30   Ct Cervical Spine Wo Contrast  Result Date: 02/03/2017 CLINICAL DATA:  Patient fell out of bed. No report of loss of consciousness. Neck pain. RIGHT forehead hematoma. LEFT forehead hematoma reportedly old. EXAM: CT HEAD WITHOUT CONTRAST CT CERVICAL SPINE WITHOUT CONTRAST TECHNIQUE: Multidetector CT imaging of the head and cervical spine was performed following the standard protocol without intravenous contrast. Multiplanar CT image reconstructions of the cervical spine were also generated. COMPARISON:  CT head 01/01/2017. CT head and cervical spine 10/11/2016. FINDINGS: CT HEAD FINDINGS Brain: No evidence of acute infarction, hemorrhage, hydrocephalus, extra-axial collection or mass lesion/mass effect. Moderate atrophy, not unexpected for age. Hypoattenuation of white matter likely small vessel disease. Vascular: Calcification of the cavernous internal carotid arteries consistent with cerebrovascular atherosclerotic disease. No signs of intracranial  large vessel occlusion. Skull: Calvarium intact.  BILATERAL frontal scalp hematomas. Sinuses/Orbits: No layering sinus fluid. BILATERAL cataract extraction. Other: No mastoid fluid. CT CERVICAL SPINE FINDINGS Alignment: There is straightening, and slight reversal, of the normal cervical lordosis. 2 mm anterolisthesis C4-5, 1 mm anterolisthesis C3-4, and 1 mm retrolisthesis C6-7 all facet mediated. Skull base and vertebrae: No acute fracture. No primary bone lesion or focal pathologic process. Soft tissues and spinal canal: No prevertebral fluid or swelling. No  visible canal hematoma. Carotid atherosclerosis. Disc levels: Multilevel spondylosis, most pronounced at C5-6 and C6-7. BILATERAL cervical facet arthropathy. Upper chest: Large RIGHT pleural effusion. This appears similar to slightly smaller compared CT cervical spine from September 2018. Other: None. IMPRESSION: Atrophy and small vessel disease similar to priors. No skull fracture or intracranial hemorrhage. BILATERAL scalp hematomas, which could be related to the acute fall. Cervical spondylosis. No cervical spine fracture or traumatic subluxation. RIGHT pleural effusion, which appears chronic based on review of another cervical spine MRI from last year. This effusion however is incompletely evaluated. A chest radiograph is warranted for further evaluation. Electronically Signed   By: Staci Righter M.D.   On: 02/03/2017 07:30      Allergies as of 02/04/2017      Reactions   Statins Other (See Comments)   Muscle weakness Muscle weakness severe   Codeine Sulfate Nausea And Vomiting   Codeine Other (See Comments)   GI UPSET   Effexor [venlafaxine] Nausea Only   Ezetimibe-simvastatin Other (See Comments)   Muscle weakness      Medication List    STOP taking these medications   traMADol 50 MG tablet Commonly known as:  ULTRAM     TAKE these medications   acetaminophen 325 MG tablet Commonly known as:  TYLENOL Take 2 tablets (650 mg total) by mouth every 4 (four) hours as needed for mild pain or moderate pain.   diphenoxylate-atropine 2.5-0.025 MG tablet Commonly known as:  LOMOTIL TAKE 1 TABLET BY MOUTH FOUR TIMES DAILY AS NEEDED FOR DIARRHEA OR LOOSE STOOLS What changed:  See the new instructions.   feeding supplement (NEPRO CARB STEADY) Liqd Take 237 mLs by mouth 2 (two) times daily between meals.   folic acid-vitamin b complex-vitamin c-selenium-zinc 3 MG Tabs tablet Take 1 tablet by mouth daily.   levothyroxine 25 MCG tablet Commonly known as:  SYNTHROID, LEVOTHROID Take 1  tablet (25 mcg total) by mouth daily. What changed:  additional instructions   lidocaine-prilocaine cream Commonly known as:  EMLA Apply 1 application topically every Monday, Wednesday, and Friday with hemodialysis. (1100) apply to right arm dialysis access pre-dialysis wrap with saran wrap after applying.   metoCLOPramide 5 MG tablet Commonly known as:  REGLAN Take 1 tablet (5 mg total) by mouth every 8 (eight) hours as needed (vertigo).   metoprolol succinate 25 MG 24 hr tablet Commonly known as:  TOPROL XL Take 1 tablet (25 mg total) by mouth daily.   polyethylene glycol packet Commonly known as:  MIRALAX / GLYCOLAX Take 17 g by mouth daily as needed for mild constipation.   ramipril 2.5 MG capsule Commonly known as:  ALTACE Take 1 capsule (2.5 mg total) by mouth at bedtime.   sertraline 50 MG tablet Commonly known as:  ZOLOFT TAKE 3 TABLETS(150 MG) BY MOUTH DAILY   Vitamin D (Ergocalciferol) 50000 units Caps capsule Commonly known as:  DRISDOL Take 50,000 Units by mouth every Monday. (0900)          Management  plans discussed with the patient and she is in agreement. Stable for discharge SNF  Patient should follow up with pcp  CODE STATUS:     Code Status Orders  (From admission, onward)        Start     Ordered   02/03/17 1229  Do not attempt resuscitation (DNR)  Continuous    Question Answer Comment  In the event of cardiac or respiratory ARREST Do not call a "code blue"   In the event of cardiac or respiratory ARREST Do not perform Intubation, CPR, defibrillation or ACLS   In the event of cardiac or respiratory ARREST Use medication by any route, position, wound care, and other measures to relive pain and suffering. May use oxygen, suction and manual treatment of airway obstruction as needed for comfort.   Comments Comfort care, RN may pronounce      02/03/17 1229    Code Status History    Date Active Date Inactive Code Status Order ID Comments User  Context   01/01/2017 20:27 01/08/2017 18:58 Full Code [id number]  [name], MD ED   05/12/2016 08:09 05/14/2016 14:39 DNR [id number]  Max Sane, MD Inpatient   05/10/2016 05:06 05/12/2016 08:09 Full Code [id number]  Saundra Shelling, MD Inpatient   12/15/2015 15:15 12/19/2015 01:59 Full Code [id number]  Oletta Cohn, DO Inpatient   12/14/2015 16:13 12/15/2015 15:15 Full Code [id number]  Bettey Costa, MD Inpatient    Advance Directive Documentation     Most Recent Value  Type of Advance Directive  Healthcare Power of Attorney, Living will [brother Ed Riddle]  Pre-existing out of facility DNR order (yellow form or pink MOST form)  No data  "MOST" Form in Place?  No data      TOTAL TIME TAKING CARE OF THIS PATIENT: 38 minutes.    Note: This dictation was prepared with Dragon dictation along with smaller phrase technology. Any transcriptional errors that result from this process are unintentional.  Love Chowning M.D on 02/04/2017 at 12:04 PM  Between 7am to 6pm - Pager - [phone] After 6pm go to [email or web address] - password [email or web address] [facility]  Office  [phone]  CC: Primary care physician; Sharon Ruths, MD

## 2017-02-04 NOTE — Progress Notes (Signed)
Report called to [employer], LPN @ [facility]. Calling EMS for transport.

## 2017-02-04 NOTE — Care Management Important Message (Signed)
Important Message  Patient Details  [name] MRN: [id number] Date of Birth: 11-Mar-1926   Medicare Important Message Given:  N/A - LOS <3 / Initial given by admissions    Beverly Sessions, RN 02/04/2017, 2:02 PM

## 2017-02-05 ENCOUNTER — Encounter (INDEPENDENT_AMBULATORY_CARE_PROVIDER_SITE_OTHER): Payer: Self-pay

## 2017-02-13 ENCOUNTER — Emergency Department: Payer: Medicare Other

## 2017-02-13 ENCOUNTER — Other Ambulatory Visit: Payer: Self-pay

## 2017-02-13 ENCOUNTER — Emergency Department
Admission: EM | Admit: 2017-02-13 | Discharge: 2017-02-13 | Disposition: A | Discharge status: Home / Self Care | Payer: Medicare Other | Attending: Emergency Medicine | Admitting: Emergency Medicine

## 2017-02-13 DIAGNOSIS — S42031D Displaced fracture of lateral end of right clavicle, subsequent encounter for fracture with routine healing: Secondary | ICD-10-CM | POA: Diagnosis not present

## 2017-02-13 DIAGNOSIS — Z85828 Personal history of other malignant neoplasm of skin: Secondary | ICD-10-CM | POA: Insufficient documentation

## 2017-02-13 DIAGNOSIS — I2510 Atherosclerotic heart disease of native coronary artery without angina pectoris: Secondary | ICD-10-CM | POA: Insufficient documentation

## 2017-02-13 DIAGNOSIS — I252 Old myocardial infarction: Secondary | ICD-10-CM | POA: Insufficient documentation

## 2017-02-13 DIAGNOSIS — Z79899 Other long term (current) drug therapy: Secondary | ICD-10-CM | POA: Diagnosis not present

## 2017-02-13 DIAGNOSIS — M542 Cervicalgia: Secondary | ICD-10-CM | POA: Diagnosis not present

## 2017-02-13 DIAGNOSIS — N186 End stage renal disease: Secondary | ICD-10-CM | POA: Diagnosis not present

## 2017-02-13 DIAGNOSIS — Y939 Activity, unspecified: Secondary | ICD-10-CM | POA: Insufficient documentation

## 2017-02-13 DIAGNOSIS — I132 Hypertensive heart and chronic kidney disease with heart failure and with stage 5 chronic kidney disease, or end stage renal disease: Secondary | ICD-10-CM | POA: Diagnosis not present

## 2017-02-13 DIAGNOSIS — Z87891 Personal history of nicotine dependence: Secondary | ICD-10-CM | POA: Diagnosis not present

## 2017-02-13 DIAGNOSIS — R51 Headache: Secondary | ICD-10-CM | POA: Diagnosis not present

## 2017-02-13 DIAGNOSIS — Z96652 Presence of left artificial knee joint: Secondary | ICD-10-CM | POA: Insufficient documentation

## 2017-02-13 DIAGNOSIS — Y999 Unspecified external cause status: Secondary | ICD-10-CM | POA: Insufficient documentation

## 2017-02-13 DIAGNOSIS — Y92129 Unspecified place in nursing home as the place of occurrence of the external cause: Secondary | ICD-10-CM | POA: Insufficient documentation

## 2017-02-13 DIAGNOSIS — I5022 Chronic systolic (congestive) heart failure: Secondary | ICD-10-CM | POA: Insufficient documentation

## 2017-02-13 DIAGNOSIS — J90 Pleural effusion, not elsewhere classified: Secondary | ICD-10-CM | POA: Diagnosis not present

## 2017-02-13 DIAGNOSIS — W07XXXA Fall from chair, initial encounter: Secondary | ICD-10-CM | POA: Insufficient documentation

## 2017-02-13 DIAGNOSIS — M25511 Pain in right shoulder: Secondary | ICD-10-CM | POA: Diagnosis not present

## 2017-02-13 DIAGNOSIS — Z992 Dependence on renal dialysis: Secondary | ICD-10-CM | POA: Diagnosis not present

## 2017-02-13 LAB — CBC WITH DIFFERENTIAL/PLATELET
BASOS ABS: 0 10*3/uL (ref 0–0.1)
BASOS PCT: 1 %
EOS ABS: 0.2 10*3/uL (ref 0–0.7)
EOS PCT: 4 %
HCT: 24.9 % — ABNORMAL LOW (ref 35.0–47.0)
HEMOGLOBIN: 8.3 g/dL — AB (ref 12.0–16.0)
LYMPHS ABS: 0.7 10*3/uL — AB (ref 1.0–3.6)
Lymphocytes Relative: 11 %
MCH: 32.7 pg (ref 26.0–34.0)
MCHC: 33.4 g/dL (ref 32.0–36.0)
MCV: 98.1 fL (ref 80.0–100.0)
Monocytes Absolute: 0.4 10*3/uL (ref 0.2–0.9)
Monocytes Relative: 6 %
NEUTROS PCT: 78 %
Neutro Abs: 5 10*3/uL (ref 1.4–6.5)
PLATELETS: 268 10*3/uL (ref 150–440)
RBC: 2.54 MIL/uL — AB (ref 3.80–5.20)
RDW: 15 % — ABNORMAL HIGH (ref 11.5–14.5)
WBC: 6.3 10*3/uL (ref 3.6–11.0)

## 2017-02-13 LAB — BASIC METABOLIC PANEL
Anion gap: 11 (ref 5–15)
BUN: 40 mg/dL — AB (ref 6–20)
CALCIUM: 8.8 mg/dL — AB (ref 8.9–10.3)
CO2: 24 mmol/L (ref 22–32)
CREATININE: 2.57 mg/dL — AB (ref 0.44–1.00)
Chloride: 99 mmol/L — ABNORMAL LOW (ref 101–111)
GFR, EST AFRICAN AMERICAN: 18 mL/min — AB (ref >60)
GFR, EST NON AFRICAN AMERICAN: 16 mL/min — AB (ref >60)
Glucose, Bld: 120 mg/dL — ABNORMAL HIGH (ref 65–99)
Potassium: 3.5 mmol/L (ref 3.5–5.1)
SODIUM: 134 mmol/L — AB (ref 135–145)

## 2017-02-13 MED ORDER — TRAMADOL HCL 50 MG PO TABS
50.0000 mg | ORAL_TABLET | Freq: Once | ORAL | Status: AC
  Administered 2017-02-13: 50 mg via ORAL
  Filled 2017-02-13: qty 1

## 2017-02-13 MED ORDER — TRAMADOL HCL 50 MG PO TABS: 50.0000 mg | ORAL_TABLET | Freq: Two times a day (BID) | ORAL | 0 refills | Status: DC | PRN

## 2017-02-13 MED ORDER — MORPHINE SULFATE (PF) 2 MG/ML IV SOLN
2.0000 mg | Freq: Once | INTRAVENOUS | Status: AC
  Administered 2017-02-13: 2 mg via INTRAVENOUS
  Filled 2017-02-13: qty 1

## 2017-02-13 NOTE — ED Notes (Addendum)
This RN spoke with Sharon [name], Librarian, academic at [facility], to give report for pt returning via EMS transport. This RN also spoke with Sharon [name], pt's brother and HCPOA. Sharon [name] states she can sign for her discharge when EMS arrives.

## 2017-02-13 NOTE — Discharge Instructions (Signed)
Return to the emergency department for new or worsening pain, weakness, numbness, skin breakdown, recurrent falls, new injury, or any other new or worsening symptoms that concern you.  You should follow-up for medical clearance on Monday and for the surgery on Tuesday as planned.

## 2017-02-13 NOTE — ED Notes (Signed)
Pt is from [facility].

## 2017-02-13 NOTE — ED Notes (Addendum)
Pt on phone with brother Percell Miller to update him on her discharge.

## 2017-02-13 NOTE — ED Provider Notes (Addendum)
[facility] Emergency Department Provider Note ____________________________________________   First MD Initiated Contact with Patient 02/13/17 1159     (approximate)  I have reviewed the triage vital signs and the nursing notes.   HISTORY  Chief Complaint Fall    HPI Sharon [name] is a 84 y.o. female who presents with right shoulder and arm injury as well as possible head injury, acute onset this morning when the patient fell from a chair, and occurring after she fell asleep.  Patient states that she has had frequent falls at least once every few weeks for the last several years, and reports that they usually happen when she falls asleep.  She states that she has been falling asleep very easily and sometimes even while having a conversation or during a meal.  She states that this is been an ongoing problem for at least a year.  She reports prior injury to the right shoulder and is actually scheduled for surgery this week, and states that she has new pain there.  She also reports pain to the head as well as to the right side of the neck.  She denies other injuries.  She denies any recent illness, or any new or worsening fevers, vomiting, or other acute symptoms.  Past Medical History:  Diagnosis Date  . Anemia   . Arthritis   . CAD (coronary artery disease)   . Cancer ([place])    skin  . Cardiomyopathy ([place])   . CHF (congestive heart failure) ([place])   . Depression   . IBS (irritable bowel syndrome) 2010  . Kidney failure July 2012   Hemodialysis 3xweek  . Kidney failure   . Meniere disease   . Meniere's disease   . Myocardial infarction ([place])   . OSA (obstructive sleep apnea)    CPAP  . Peritoneal dialysis status ([place])   . Presence of permanent cardiac pacemaker   . Renal insufficiency     Patient Active Problem List   Diagnosis Date Noted  . FTT (failure to thrive) in adult 02/03/2017  . ESRD on hemodialysis ([place])   . Pleural effusion on right     . Palliative care by specialist   . Pulmonary embolism ([place]) 01/02/2017  . Syncope 01/01/2017  . Vision changes 08/27/2016  . Closed fracture of neck of femur ([place]) 05/26/2016  . Hip fracture ([place]) 05/10/2016  . Falls 04/27/2016  . Head injury 04/27/2016  . Age-related osteoporosis with current pathological fracture with routine healing 04/24/2016  . Recurrent falls 04/24/2016  . Mild protein-calorie malnutrition ([place]) 04/24/2016  . ESRD on dialysis ([place]) 04/10/2016  . Periprosthetic fracture around internal prosthetic left knee joint 01/26/2016  . Pressure injury of skin 12/15/2015  . Closed left subtrochanteric femur fracture ([place]) 12/15/2015  . Femur fracture, left ([place]) 12/14/2015  . Meniere disease   . Loss of weight 10/21/2015  . Clinical depression 06/20/2015  . Dysphagia, unspecified 05/24/2015  . Imbalance 04/22/2015  . Chronic pain syndrome 05/22/2014  . Insomnia 03/13/2014  . Anxiety state 03/13/2014  . Chronic systolic CHF (congestive heart failure), NYHA class 3 ([place]) 01/15/2014  . OSA (obstructive sleep apnea) 01/15/2014  . Chronic kidney disease (CKD), stage V ([place]) 01/15/2014  . Basal cell carcinoma of neck 11/14/2013  . DDD (degenerative disc disease), cervical 11/07/2013  . Cervical radiculitis 10/16/2013  . Benign essential hypertension 09/12/2013  . Memory loss 09/12/2013  . Systolic heart failure, chronic ([place]) 05/12/2013  . Goals of care, counseling/discussion 11/10/2012  .  Sinus node dysfunction ([place]) 08/01/2012  . Low back pain 08/01/2012  . Cervical spine pain 05/02/2012  . Irritable bowel syndrome 11/02/2011  . Depression 04/01/2011  . Hypertension 04/01/2011    Past Surgical History:  Procedure Laterality Date  . [place]  . ANUS SURGERY  2010  . AV FISTULA PLACEMENT Right 04/15/2016   Procedure: ARTERIOVENOUS (AV) FISTULA CREATION ( RADIOCEPHALIC ) STAGE 2;  Surgeon: Algernon Huxley, MD;  Location: [facility] ORS;  Service:  Vascular;  Laterality: Right;  . CATARACT EXTRACTION  2006, 2011  . CHOLECYSTECTOMY  01/2008  . DIALYSIS/PERMA CATHETER INSERTION N/A 01/07/2017   Procedure: DIALYSIS/PERMA CATHETER INSERTION With an IVC filter placement;  Surgeon: Algernon Huxley, MD;  Location: [facility];  Service: Cardiovascular;  Laterality: N/A;  . DIALYSIS/PERMA CATHETER REMOVAL N/A 08/20/2016   Procedure: Dialysis/Perma Catheter Removal;  Surgeon: Algernon Huxley, MD;  Location: [facility];  Service: Cardiovascular;  Laterality: N/A;  . EYE SURGERY     cataracts bilateral  . FEMUR IM NAIL Left 12/15/2015   Procedure: INTRAMEDULLARY (IM) RETROGRADE FEMORAL NAILING;  Surgeon: Oletta Cohn, DO;  Location: [facility] ORS;  Service: Orthopedics;  Laterality: Left;  . HIP PINNING,CANNULATED Left 05/10/2016   Procedure: CANNULATED HIP PINNING;  Surgeon: Earnestine Leys, MD;  Location: [facility] ORS;  Service: Orthopedics;  Laterality: Left;  . INSERT / REPLACE / REMOVE PACEMAKER    . INSERTION OF DIALYSIS CATHETER  07/2010  . JOINT REPLACEMENT     left knee  . MINOR REMOVAL OF PERITONEAL DIALYSIS CATHETER  04/15/2016   Procedure: MINOR REMOVAL OF PERITONEAL DIALYSIS CATHETER;  Surgeon: Algernon Huxley, MD;  Location: [facility] ORS;  Service: Vascular;;  . PACEMAKER INSERTION  2006  . PERIPHERAL VASCULAR CATHETERIZATION N/A 12/18/2015   Procedure: Dialysis/Perma Catheter Insertion;  Surgeon: Katha Cabal, MD;  Location: [facility];  Service: Cardiovascular;  Laterality: N/A;  . TOTAL KNEE ARTHROPLASTY  2008   LEFT/Dr Calif    Prior to Admission medications   Medication Sig Start Date End Date Taking? Authorizing Provider  acetaminophen (TYLENOL) 325 MG tablet Take 2 tablets (650 mg total) by mouth every 4 (four) hours as needed for mild pain or moderate pain. 05/14/16  Yes Wieting, Richard, MD  diphenoxylate-atropine (LOMOTIL) 2.5-0.025 MG tablet TAKE 1 TABLET BY MOUTH FOUR TIMES DAILY AS NEEDED FOR DIARRHEA OR LOOSE  STOOLS Patient taking differently: TAKE 1 TABLET BY MOUTH every 6 hours AS NEEDED FOR DIARRHEA OR LOOSE STOOLS 08/30/15  Yes [name], MD  folic acid-vitamin b complex-vitamin c-selenium-zinc (DIALYVITE) 3 MG TABS tablet Take 1 tablet by mouth daily.   Yes [provider]  levothyroxine (SYNTHROID, LEVOTHROID) 25 MCG tablet Take 1 tablet (25 mcg total) by mouth daily. 02/04/17  Yes Bettey Costa, MD  lidocaine-prilocaine (EMLA) cream Apply 1 application topically every Monday, Wednesday, and Friday with hemodialysis. (1100) apply to right arm dialysis access pre-dialysis wrap with saran wrap after applying.   Yes [provider]  metoCLOPramide (REGLAN) 5 MG tablet Take 1 tablet (5 mg total) by mouth every 8 (eight) hours as needed (vertigo). 05/30/16  Yes Menshew, Dannielle Karvonen, PA-C  metoprolol succinate (TOPROL XL) 25 MG 24 hr tablet Take 1 tablet (25 mg total) by mouth daily. 01/08/17  Yes Wieting, Richard, MD  Nutritional Supplements (FEEDING SUPPLEMENT, NEPRO CARB STEADY,) LIQD Take 237 mLs by mouth 2 (two) times daily between meals. 01/08/17  Yes Loletha Grayer, MD  polyethylene glycol (MIRALAX / GLYCOLAX) packet Take 17 g by mouth daily as needed for mild constipation. 01/08/17  Yes Wieting, Richard, MD  ramipril (ALTACE) 2.5 MG capsule Take 1 capsule (2.5 mg total) by mouth at bedtime. 01/08/17  Yes Wieting, Richard, MD  sertraline (ZOLOFT) 50 MG tablet TAKE 3 TABLETS(150 MG) BY MOUTH DAILY 08/30/15  Yes [name], MD  Vitamin D, Ergocalciferol, (DRISDOL) 50000 units CAPS capsule Take 50,000 Units by mouth every Monday. (0900) 04/17/15  Yes [provider]    Allergies Statins; Codeine sulfate; Codeine; Effexor [venlafaxine]; and Ezetimibe-simvastatin  Family History  Problem Relation Age of Onset  . Cancer Mother        ? ovarian - sarcoma  . Cancer Father        Skin cancer  . Diabetes Sister   . COPD Sister   . Depression Sister   . Cancer Sister         Lung - 35 yrs old    Social History Social History   Tobacco Use  . Smoking status: Former Smoker    Last attempt to quit: 03/31/1948    Years since quitting: 68.9  . Smokeless tobacco: Never Used  Substance Use Topics  . Alcohol use: No  . Drug use: No    Review of Systems  Constitutional: No fever. Eyes: No redness. ENT: Positive for neck pain. Cardiovascular: Denies chest pain. Respiratory: Denies shortness of breath. Gastrointestinal: No abdominal pain.  Genitourinary: Negative for flank pain.  Musculoskeletal: Positive for chronic back pain. Skin: Positive for bruising. Neurological: Negative for headache.   ____________________________________________   PHYSICAL EXAM:  VITAL SIGNS: ED Triage Vitals  Enc Vitals Group     BP 02/13/17 1130 (!) 140/57     Pulse Rate 02/13/17 1130 60     Resp 02/13/17 1130 11     Temp 02/13/17 1130 98.1 F (36.7 C)     Temp Source 02/13/17 1130 Oral     SpO2 02/13/17 1113 98 %     Weight 02/13/17 1137 98 lb (44.5 kg)     Height 02/13/17 1137 5\' 4"  (1.626 m)     Head Circumference --      Peak Flow --      Pain Score --      Pain Loc --      Pain Edu? --      Excl. in [place]? --     Constitutional: Alert and oriented. Slightly uncomfortable appearing but in no acute distress. Eyes: Conjunctivae are normal.  EOMI.  PERRLA. Head: Ecchymosis to left temporal area, appears subacute. Nose: No congestion/rhinnorhea. Mouth/Throat: Mucous membranes are moist.   Neck: Normal range of motion.  Mild midline and right paraspinal tenderness. Cardiovascular: Normal rate, regular rhythm. Grossly normal heart sounds.  Good peripheral circulation. Respiratory: Normal respiratory effort.  No retractions. Lungs CTAB.  Chest wall nontender. Gastrointestinal: Soft and nontender. No distention.  Genitourinary: No CVA tenderness. Musculoskeletal: No lower extremity edema.  Extremities warm and well perfused.  Chronic appearing bruising and  deformity to right shoulder.  Mild tenderness to right mid humerus which patient states is new.  Elbow nontender.  2+ distal pulses in all extremities. Neurologic:  Normal speech and language. No gross focal neurologic deficits are appreciated.  Motor and sensory intact in all extremities.  Normal coordination. Skin:  Skin is warm and dry. No rash noted. Psychiatric: Mood and affect are normal. Speech and behavior are normal.  ____________________________________________   LABS (all  labs ordered are listed, but only abnormal results are displayed)  Labs Reviewed  BASIC METABOLIC PANEL - Abnormal; Notable for the following components:      Result Value   Sodium 134 (*)    Chloride 99 (*)    Glucose, Bld 120 (*)    BUN 40 (*)    Creatinine, Ser 2.57 (*)    Calcium 8.8 (*)    GFR calc non Af Amer 16 (*)    GFR calc Af Amer 18 (*)    All other components within normal limits  CBC WITH DIFFERENTIAL/PLATELET - Abnormal; Notable for the following components:   RBC 2.54 (*)    Hemoglobin 8.3 (*)    HCT 24.9 (*)    RDW 15.0 (*)    Lymphs Abs 0.7 (*)    All other components within normal limits   ____________________________________________  EKG  ED ECG REPORT I, Arta Silence, the attending physician, personally viewed and interpreted this ECG.  Date: 02/13/2017 EKG Time: 1330 Rate: 79 Rhythm: Atrial sensed ventricular paced rhythm QRS Axis: normal Intervals: normal ST/T Wave abnormalities: normal Narrative Interpretation: no evidence of acute ischemia; no significant change when compared to EKG of 02/03/2017  ____________________________________________  RADIOLOGY  CT head: No ICH or other acute findings CT c-spine: No acute fracture XR R humerus: Distal clavicle fracture ____________________________________________   PROCEDURES  Procedure(s) performed: No    Critical Care performed: No ____________________________________________   INITIAL IMPRESSION /  ASSESSMENT AND PLAN / ED COURSE  Pertinent labs & imaging results that were available during my care of the patient were reviewed by me and considered in my medical decision making (see chart for details).  84 year old female with past medical history as noted above and status post frequent falls presents with a fall while she was apparently in a chair and fell asleep, with resulting new right arm pain as well as possible head injury and neck pain..  She reports chronically falling asleep suddenly and this usually precipitates her falls.  She denies any other new or recent illness or other new symptoms.  I reviewed the past medical records in Epic; patient was admitted earlier this month for fracture of the distal right clavicle, and admission for pulmonary embolism in December 2018.  She is on Eliquis.  On exam, vital signs are normal, the patient is uncomfortable but not acutely ill-appearing, and the remainder the exam is as described above with subacute appearing ecchymosis to the left temple, new pain to the right mid humerus with evidence of old injury which patient is scheduled for surgery to repair, and mainly right-sided neck pain.  Overall this appears to be a fall related to patient's chronic frequent falls, and there is no evidence of acute illness.  Given patient's age I will obtain basic labs, UA, and EKG to screen for any acute precipitating causes.  Will obtain imaging of the relevant areas to rule out new injury.  Anticipate discharge back to her facility if the workup is negative.    ----------------------------------------- 2:35 PM on 02/13/2017 -----------------------------------------  X-ray reveals persistent right lateral clavicle fracture which appears somewhat more displaced than prior.  I consulted Dr. Roland Rack who is planning to operate on the patient on Tuesday, and he confirmed this plan and has no acute recommendations in the meantime, and that there is no changed in the  plan based on the increased displacement on the XR.  I had an extensive discussion with the patient's brother who is her power  of attorney, and has concerns about the current situation at the nursing facility due to her increased falls.  He expressed to me that he would prefer for her to be admitted to the hospital for observation until the surgery if possible.    I discussed the possible admission with the hospitalist, Dr. Doy Hutching.  At this time given the patient's negative workup there is no acute medical indication for an admission.  Since that her surgery is not until Tuesday, she also would not qualify for an observation admission for that duration.  He also informed me that there would be a potential that a hospital stay as well as her dialysis would not be covered by insurance.  Dr. Doy Hutching kindly offered to discuss the options with the patient's brother and with the patient.  Dr. Doy Hutching informed me that after their discussion, both the patient and her brother expressed understanding of the situation and agreed with having the patient brought back to [facility] until the surgery.  Dr. Doy Hutching also called [facility] and discussed the case with the nursing supervisor to ensure that the patient will be getting adequate fall precautions and adequate care until she is to return for the surgery.  At this time, the patient and her brother are in agreement with the plan.  Return precautions given, and the patient expresses understanding.  ____________________________________________   FINAL CLINICAL IMPRESSION(S) / ED DIAGNOSES  Final diagnoses:  Closed displaced fracture of acromial end of right clavicle with routine healing, subsequent encounter  Acute pain of right shoulder      NEW MEDICATIONS STARTED DURING THIS VISIT:  New Prescriptions   No medications on file     Note:  This document was prepared using Dragon voice recognition software and may include unintentional dictation  errors.    Arta Silence, MD 02/13/17 1436    Arta Silence, MD 02/13/17 [phone]

## 2017-02-13 NOTE — ED Triage Notes (Signed)
Pt fell, fx'd R shoulder 12 days ago Pt fell out of a chair today, reinjuring R shoulder. [place] has step off and is exquiisitely tender to palpation and movement. Pt is still in shoulder immobilizer from prior injury. Pt has extensive bruising to R arm, shoulder and forearm.  Pt has ESRD, last dialyzed x 1 day ago. Pt has temporary dialysis access in L up[er chest. Pt has a pace maker and is in a paced rhythm.

## 2017-02-13 NOTE — ED Triage Notes (Signed)
Pt also c/o suddenly falling asleep, this is an on-going problem. Pt's fall today was unwitnessed, from a chair and pt states she was asleep before she fell out of the chair. Pt also has a heatoma to L crown, no bleeding, firm to the touch and painful. Unknown when that was sustained.

## 2017-02-15 NOTE — Pre-Procedure Instructions (Signed)
Will be here 2 hours before her surgery tomorrow.  Instructed to take Metoprolol and Levothyroxin morning of surgery.  She can have clear liquids up to her time of arrival. Nothing to eat after midnight.

## 2017-02-16 ENCOUNTER — Ambulatory Visit: Payer: Medicare Other | Admitting: Anesthesiology

## 2017-02-16 ENCOUNTER — Encounter
Admission: RE | Disposition: A | Discharge status: Skilled Nursing Facility | Payer: Self-pay | Source: Ambulatory Visit | Attending: Surgery

## 2017-02-16 ENCOUNTER — Encounter: Payer: Self-pay | Admitting: *Deleted

## 2017-02-16 ENCOUNTER — Observation Stay
Admission: RE | Admit: 2017-02-16 | Discharge: 2017-02-17 | Disposition: A | Discharge status: Skilled Nursing Facility | Payer: Medicare Other | Source: Ambulatory Visit | Attending: Surgery | Admitting: Surgery

## 2017-02-16 ENCOUNTER — Ambulatory Visit: Payer: Medicare Other

## 2017-02-16 ENCOUNTER — Observation Stay: Payer: Medicare Other

## 2017-02-16 ENCOUNTER — Other Ambulatory Visit: Payer: Self-pay

## 2017-02-16 DIAGNOSIS — D631 Anemia in chronic kidney disease: Secondary | ICD-10-CM | POA: Insufficient documentation

## 2017-02-16 DIAGNOSIS — Z808 Family history of malignant neoplasm of other organs or systems: Secondary | ICD-10-CM | POA: Insufficient documentation

## 2017-02-16 DIAGNOSIS — Z950 Presence of cardiac pacemaker: Secondary | ICD-10-CM | POA: Diagnosis not present

## 2017-02-16 DIAGNOSIS — J90 Pleural effusion, not elsewhere classified: Secondary | ICD-10-CM | POA: Insufficient documentation

## 2017-02-16 DIAGNOSIS — R296 Repeated falls: Secondary | ICD-10-CM | POA: Diagnosis not present

## 2017-02-16 DIAGNOSIS — I5022 Chronic systolic (congestive) heart failure: Secondary | ICD-10-CM | POA: Insufficient documentation

## 2017-02-16 DIAGNOSIS — M1990 Unspecified osteoarthritis, unspecified site: Secondary | ICD-10-CM | POA: Diagnosis not present

## 2017-02-16 DIAGNOSIS — Z96652 Presence of left artificial knee joint: Secondary | ICD-10-CM | POA: Insufficient documentation

## 2017-02-16 DIAGNOSIS — N186 End stage renal disease: Secondary | ICD-10-CM | POA: Diagnosis not present

## 2017-02-16 DIAGNOSIS — Z992 Dependence on renal dialysis: Secondary | ICD-10-CM | POA: Diagnosis not present

## 2017-02-16 DIAGNOSIS — N2581 Secondary hyperparathyroidism of renal origin: Secondary | ICD-10-CM | POA: Diagnosis not present

## 2017-02-16 DIAGNOSIS — G4733 Obstructive sleep apnea (adult) (pediatric): Secondary | ICD-10-CM | POA: Diagnosis not present

## 2017-02-16 DIAGNOSIS — Y939 Activity, unspecified: Secondary | ICD-10-CM | POA: Diagnosis not present

## 2017-02-16 DIAGNOSIS — R1310 Dysphagia, unspecified: Secondary | ICD-10-CM | POA: Insufficient documentation

## 2017-02-16 DIAGNOSIS — M5030 Other cervical disc degeneration, unspecified cervical region: Secondary | ICD-10-CM | POA: Insufficient documentation

## 2017-02-16 DIAGNOSIS — S42031A Displaced fracture of lateral end of right clavicle, initial encounter for closed fracture: Principal | ICD-10-CM

## 2017-02-16 DIAGNOSIS — Z86711 Personal history of pulmonary embolism: Secondary | ICD-10-CM | POA: Insufficient documentation

## 2017-02-16 DIAGNOSIS — K589 Irritable bowel syndrome without diarrhea: Secondary | ICD-10-CM | POA: Insufficient documentation

## 2017-02-16 DIAGNOSIS — Z833 Family history of diabetes mellitus: Secondary | ICD-10-CM | POA: Diagnosis not present

## 2017-02-16 DIAGNOSIS — Z825 Family history of asthma and other chronic lower respiratory diseases: Secondary | ICD-10-CM | POA: Insufficient documentation

## 2017-02-16 DIAGNOSIS — W06XXXA Fall from bed, initial encounter: Secondary | ICD-10-CM | POA: Insufficient documentation

## 2017-02-16 DIAGNOSIS — I132 Hypertensive heart and chronic kidney disease with heart failure and with stage 5 chronic kidney disease, or end stage renal disease: Secondary | ICD-10-CM | POA: Insufficient documentation

## 2017-02-16 DIAGNOSIS — I739 Peripheral vascular disease, unspecified: Secondary | ICD-10-CM | POA: Insufficient documentation

## 2017-02-16 DIAGNOSIS — I429 Cardiomyopathy, unspecified: Secondary | ICD-10-CM | POA: Insufficient documentation

## 2017-02-16 DIAGNOSIS — Z801 Family history of malignant neoplasm of trachea, bronchus and lung: Secondary | ICD-10-CM | POA: Diagnosis not present

## 2017-02-16 DIAGNOSIS — Z9049 Acquired absence of other specified parts of digestive tract: Secondary | ICD-10-CM | POA: Diagnosis not present

## 2017-02-16 DIAGNOSIS — Z9181 History of falling: Secondary | ICD-10-CM | POA: Diagnosis not present

## 2017-02-16 DIAGNOSIS — G894 Chronic pain syndrome: Secondary | ICD-10-CM | POA: Insufficient documentation

## 2017-02-16 DIAGNOSIS — Y92122 Bedroom in nursing home as the place of occurrence of the external cause: Secondary | ICD-10-CM | POA: Insufficient documentation

## 2017-02-16 DIAGNOSIS — I700 Atherosclerosis of aorta: Secondary | ICD-10-CM | POA: Insufficient documentation

## 2017-02-16 DIAGNOSIS — I252 Old myocardial infarction: Secondary | ICD-10-CM | POA: Diagnosis not present

## 2017-02-16 DIAGNOSIS — Z419 Encounter for procedure for purposes other than remedying health state, unspecified: Secondary | ICD-10-CM

## 2017-02-16 DIAGNOSIS — S0003XA Contusion of scalp, initial encounter: Secondary | ICD-10-CM | POA: Insufficient documentation

## 2017-02-16 DIAGNOSIS — H8109 Meniere's disease, unspecified ear: Secondary | ICD-10-CM | POA: Insufficient documentation

## 2017-02-16 DIAGNOSIS — I6529 Occlusion and stenosis of unspecified carotid artery: Secondary | ICD-10-CM | POA: Insufficient documentation

## 2017-02-16 DIAGNOSIS — F419 Anxiety disorder, unspecified: Secondary | ICD-10-CM | POA: Insufficient documentation

## 2017-02-16 DIAGNOSIS — Z9841 Cataract extraction status, right eye: Secondary | ICD-10-CM | POA: Insufficient documentation

## 2017-02-16 DIAGNOSIS — I2510 Atherosclerotic heart disease of native coronary artery without angina pectoris: Secondary | ICD-10-CM | POA: Diagnosis not present

## 2017-02-16 DIAGNOSIS — F329 Major depressive disorder, single episode, unspecified: Secondary | ICD-10-CM | POA: Insufficient documentation

## 2017-02-16 DIAGNOSIS — Z79899 Other long term (current) drug therapy: Secondary | ICD-10-CM | POA: Insufficient documentation

## 2017-02-16 DIAGNOSIS — Z681 Body mass index (BMI) 19 or less, adult: Secondary | ICD-10-CM | POA: Insufficient documentation

## 2017-02-16 DIAGNOSIS — Z9842 Cataract extraction status, left eye: Secondary | ICD-10-CM | POA: Insufficient documentation

## 2017-02-16 DIAGNOSIS — Z87891 Personal history of nicotine dependence: Secondary | ICD-10-CM | POA: Insufficient documentation

## 2017-02-16 DIAGNOSIS — G4700 Insomnia, unspecified: Secondary | ICD-10-CM | POA: Insufficient documentation

## 2017-02-16 DIAGNOSIS — R627 Adult failure to thrive: Secondary | ICD-10-CM | POA: Insufficient documentation

## 2017-02-16 HISTORY — DX: Presence of other vascular implants and grafts: Z95.828

## 2017-02-16 HISTORY — PX: ORIF CLAVICULAR FRACTURE: SHX5055

## 2017-02-16 LAB — POCT I-STAT 4, (NA,K, GLUC, HGB,HCT)
GLUCOSE: 117 mg/dL — AB (ref 65–99)
HEMATOCRIT: 25 % — AB (ref 36.0–46.0)
Hemoglobin: 8.5 g/dL — ABNORMAL LOW (ref 12.0–15.0)
Potassium: 3.3 mmol/L — ABNORMAL LOW (ref 3.5–5.1)
SODIUM: 137 mmol/L (ref 135–145)

## 2017-02-16 SURGERY — OPEN REDUCTION INTERNAL FIXATION (ORIF) CLAVICULAR FRACTURE
Anesthesia: General | Site: Shoulder | Laterality: Right | Wound class: Clean

## 2017-02-16 MED ORDER — METOCLOPRAMIDE HCL 10 MG PO TABS: 5.0000 mg | ORAL_TABLET | Freq: Three times a day (TID) | ORAL | Status: DC | PRN

## 2017-02-16 MED ORDER — ONDANSETRON HCL 4 MG PO TABS: 4.0000 mg | ORAL_TABLET | Freq: Four times a day (QID) | ORAL | Status: DC | PRN

## 2017-02-16 MED ORDER — FENTANYL CITRATE (PF) 100 MCG/2ML IJ SOLN
INTRAMUSCULAR | Status: DC | PRN
  Administered 2017-02-16: 50 ug via INTRAVENOUS

## 2017-02-16 MED ORDER — LIDOCAINE HCL (CARDIAC) 20 MG/ML IV SOLN
INTRAVENOUS | Status: DC | PRN
  Administered 2017-02-16: 60 mg via INTRAVENOUS

## 2017-02-16 MED ORDER — FENTANYL CITRATE (PF) 100 MCG/2ML IJ SOLN
INTRAMUSCULAR | Status: AC
  Administered 2017-02-16: 25 ug via INTRAVENOUS
  Filled 2017-02-16: qty 2

## 2017-02-16 MED ORDER — NEOMYCIN-POLYMYXIN B GU 40-200000 IR SOLN
Status: AC
  Filled 2017-02-16: qty 2

## 2017-02-16 MED ORDER — FAMOTIDINE 20 MG PO TABS
20.0000 mg | ORAL_TABLET | Freq: Once | ORAL | Status: AC
  Administered 2017-02-16: 20 mg via ORAL

## 2017-02-16 MED ORDER — VITAMIN D (ERGOCALCIFEROL) 1.25 MG (50000 UNIT) PO CAPS: 50000.0000 [IU] | ORAL_CAPSULE | ORAL | Status: DC

## 2017-02-16 MED ORDER — DOCUSATE SODIUM 100 MG PO CAPS
100.0000 mg | ORAL_CAPSULE | Freq: Two times a day (BID) | ORAL | Status: DC
  Administered 2017-02-16: 100 mg via ORAL
  Filled 2017-02-16: qty 1

## 2017-02-16 MED ORDER — PROPOFOL 10 MG/ML IV BOLUS
INTRAVENOUS | Status: DC | PRN
  Administered 2017-02-16: 70 mg via INTRAVENOUS

## 2017-02-16 MED ORDER — METOCLOPRAMIDE HCL 5 MG/ML IJ SOLN: 5.0000 mg | Freq: Three times a day (TID) | INTRAMUSCULAR | Status: DC | PRN

## 2017-02-16 MED ORDER — ONDANSETRON HCL 4 MG/2ML IJ SOLN: 4.0000 mg | Freq: Once | INTRAMUSCULAR | Status: DC | PRN

## 2017-02-16 MED ORDER — DEXAMETHASONE SODIUM PHOSPHATE 10 MG/ML IJ SOLN
INTRAMUSCULAR | Status: DC | PRN
  Administered 2017-02-16: 5 mg via INTRAVENOUS

## 2017-02-16 MED ORDER — FLEET ENEMA 7-19 GM/118ML RE ENEM: 1.0000 | ENEMA | Freq: Once | RECTAL | Status: DC | PRN

## 2017-02-16 MED ORDER — RENA-VITE PO TABS
1.0000 | ORAL_TABLET | Freq: Every day | ORAL | Status: DC
  Administered 2017-02-16: 1 via ORAL
  Filled 2017-02-16 (×2): qty 1

## 2017-02-16 MED ORDER — FENTANYL CITRATE (PF) 100 MCG/2ML IJ SOLN
25.0000 ug | INTRAMUSCULAR | Status: DC | PRN
  Administered 2017-02-16 (×2): 25 ug via INTRAVENOUS

## 2017-02-16 MED ORDER — SERTRALINE HCL 50 MG PO TABS
150.0000 mg | ORAL_TABLET | Freq: Every day | ORAL | Status: DC
  Administered 2017-02-16: 150 mg via ORAL
  Filled 2017-02-16: qty 3

## 2017-02-16 MED ORDER — BUPIVACAINE-EPINEPHRINE (PF) 0.25% -1:200000 IJ SOLN
INTRAMUSCULAR | Status: DC | PRN
  Administered 2017-02-16: 20 mL
  Administered 2017-02-16: 10 mL

## 2017-02-16 MED ORDER — METOPROLOL SUCCINATE ER 25 MG PO TB24: 25.0000 mg | ORAL_TABLET | Freq: Every day | ORAL | Status: DC

## 2017-02-16 MED ORDER — LIDOCAINE-PRILOCAINE 2.5-2.5 % EX CREA
1.0000 "application " | TOPICAL_CREAM | CUTANEOUS | Status: DC
  Filled 2017-02-16: qty 5

## 2017-02-16 MED ORDER — ROCURONIUM BROMIDE 100 MG/10ML IV SOLN
INTRAVENOUS | Status: DC | PRN
  Administered 2017-02-16: 10 mg via INTRAVENOUS
  Administered 2017-02-16: 30 mg via INTRAVENOUS

## 2017-02-16 MED ORDER — ONDANSETRON HCL 4 MG/2ML IJ SOLN
INTRAMUSCULAR | Status: DC | PRN
  Administered 2017-02-16: 4 mg via INTRAVENOUS

## 2017-02-16 MED ORDER — ACETAMINOPHEN 650 MG RE SUPP: 650.0000 mg | RECTAL | Status: DC | PRN

## 2017-02-16 MED ORDER — BISACODYL 10 MG RE SUPP: 10.0000 mg | Freq: Every day | RECTAL | Status: DC | PRN

## 2017-02-16 MED ORDER — SUCCINYLCHOLINE CHLORIDE 20 MG/ML IJ SOLN
INTRAMUSCULAR | Status: AC
  Filled 2017-02-16: qty 1

## 2017-02-16 MED ORDER — FENTANYL CITRATE (PF) 100 MCG/2ML IJ SOLN
INTRAMUSCULAR | Status: AC
  Filled 2017-02-16: qty 2

## 2017-02-16 MED ORDER — BUPIVACAINE-EPINEPHRINE (PF) 0.5% -1:200000 IJ SOLN
INTRAMUSCULAR | Status: AC
  Filled 2017-02-16: qty 30

## 2017-02-16 MED ORDER — CEFAZOLIN SODIUM-DEXTROSE 2-4 GM/100ML-% IV SOLN
2.0000 g | Freq: Once | INTRAVENOUS | Status: AC
  Administered 2017-02-16: 2 g via INTRAVENOUS

## 2017-02-16 MED ORDER — ROCURONIUM BROMIDE 50 MG/5ML IV SOLN
INTRAVENOUS | Status: AC
  Filled 2017-02-16: qty 1

## 2017-02-16 MED ORDER — FAMOTIDINE 20 MG PO TABS
ORAL_TABLET | ORAL | Status: AC
  Filled 2017-02-16: qty 1

## 2017-02-16 MED ORDER — PROPOFOL 10 MG/ML IV BOLUS
INTRAVENOUS | Status: AC
  Filled 2017-02-16: qty 20

## 2017-02-16 MED ORDER — DIPHENOXYLATE-ATROPINE 2.5-0.025 MG PO TABS: 1.0000 | ORAL_TABLET | Freq: Four times a day (QID) | ORAL | Status: DC | PRN

## 2017-02-16 MED ORDER — CEFAZOLIN SODIUM-DEXTROSE 2-4 GM/100ML-% IV SOLN
INTRAVENOUS | Status: AC
  Filled 2017-02-16: qty 100

## 2017-02-16 MED ORDER — ONDANSETRON HCL 4 MG/2ML IJ SOLN
INTRAMUSCULAR | Status: AC
  Filled 2017-02-16: qty 2

## 2017-02-16 MED ORDER — NEOMYCIN-POLYMYXIN B GU 40-200000 IR SOLN
Status: DC | PRN
  Administered 2017-02-16: 2 mL

## 2017-02-16 MED ORDER — ACETAMINOPHEN 500 MG PO TABS
1000.0000 mg | ORAL_TABLET | Freq: Four times a day (QID) | ORAL | Status: DC
  Administered 2017-02-16 – 2017-02-17 (×3): 1000 mg via ORAL
  Filled 2017-02-16 (×4): qty 2

## 2017-02-16 MED ORDER — MAGNESIUM HYDROXIDE 400 MG/5ML PO SUSP: 30.0000 mL | Freq: Every day | ORAL | Status: DC | PRN

## 2017-02-16 MED ORDER — NEOSTIGMINE METHYLSULFATE 10 MG/10ML IV SOLN
INTRAVENOUS | Status: DC | PRN
  Administered 2017-02-16: 3 mg via INTRAVENOUS

## 2017-02-16 MED ORDER — SODIUM CHLORIDE 0.9 % IV SOLN
INTRAVENOUS | Status: DC
  Administered 2017-02-16: 50 mL/h via INTRAVENOUS

## 2017-02-16 MED ORDER — ONDANSETRON HCL 4 MG/2ML IJ SOLN: 4.0000 mg | Freq: Four times a day (QID) | INTRAMUSCULAR | Status: DC | PRN

## 2017-02-16 MED ORDER — DEXTROSE 5 % IV SOLN
1.0000 g | Freq: Four times a day (QID) | INTRAVENOUS | Status: AC
  Administered 2017-02-16 – 2017-02-17 (×2): 1 g via INTRAVENOUS
  Filled 2017-02-16 (×3): qty 10

## 2017-02-16 MED ORDER — NEPRO/CARBSTEADY PO LIQD
237.0000 mL | Freq: Two times a day (BID) | ORAL | Status: DC
  Administered 2017-02-17: 237 mL via ORAL

## 2017-02-16 MED ORDER — POLYETHYLENE GLYCOL 3350 17 G PO PACK: 17.0000 g | PACK | Freq: Every day | ORAL | Status: DC | PRN

## 2017-02-16 MED ORDER — TRAMADOL HCL 50 MG PO TABS
50.0000 mg | ORAL_TABLET | Freq: Four times a day (QID) | ORAL | Status: DC | PRN
  Administered 2017-02-17: 50 mg via ORAL
  Filled 2017-02-16: qty 1

## 2017-02-16 MED ORDER — DIPHENHYDRAMINE HCL 12.5 MG/5ML PO ELIX: 12.5000 mg | ORAL_SOLUTION | ORAL | Status: DC | PRN

## 2017-02-16 MED ORDER — SODIUM CHLORIDE 0.9 % IV SOLN
INTRAVENOUS | Status: DC
  Administered 2017-02-16: 14:00:00 via INTRAVENOUS

## 2017-02-16 MED ORDER — SUCCINYLCHOLINE CHLORIDE 20 MG/ML IJ SOLN: INTRAMUSCULAR | Status: DC | PRN

## 2017-02-16 MED ORDER — BUPIVACAINE-EPINEPHRINE (PF) 0.25% -1:200000 IJ SOLN
INTRAMUSCULAR | Status: AC
  Filled 2017-02-16: qty 30

## 2017-02-16 MED ORDER — RAMIPRIL 2.5 MG PO CAPS
2.5000 mg | ORAL_CAPSULE | Freq: Every day | ORAL | Status: DC
  Administered 2017-02-16: 2.5 mg via ORAL
  Filled 2017-02-16 (×2): qty 1

## 2017-02-16 MED ORDER — ACETAMINOPHEN 325 MG PO TABS: 650.0000 mg | ORAL_TABLET | ORAL | Status: DC | PRN

## 2017-02-16 MED ORDER — GLYCOPYRROLATE 0.2 MG/ML IJ SOLN
INTRAMUSCULAR | Status: DC | PRN
  Administered 2017-02-16: .6 mg via INTRAVENOUS

## 2017-02-16 MED ORDER — LEVOTHYROXINE SODIUM 25 MCG PO TABS: 25.0000 ug | ORAL_TABLET | Freq: Every day | ORAL | Status: DC

## 2017-02-16 SURGICAL SUPPLY — 55 items
BIT DRILL 2.5X110 QC LCP DISP (BIT) ×2 IMPLANT
BIT DRILL 2.8 (BIT) ×1
BIT DRILL CANN QC 2.8X165 (BIT) ×1 IMPLANT
BNDG COHESIVE 4X5 TAN STRL (GAUZE/BANDAGES/DRESSINGS) ×2 IMPLANT
CANISTER SUCT 1200ML W/VALVE (MISCELLANEOUS) ×2 IMPLANT
CHLORAPREP W/TINT 26ML (MISCELLANEOUS) ×4 IMPLANT
DRAPE C-ARM XRAY 36X54 (DRAPES) ×2 IMPLANT
DRAPE IMP U-DRAPE 54X76 (DRAPES) ×4 IMPLANT
DRAPE INCISE IOBAN 66X45 STRL (DRAPES) ×4 IMPLANT
DRILL BIT 2.8MM (BIT) ×1
DRSG OPSITE POSTOP 4X6 (GAUZE/BANDAGES/DRESSINGS) ×2 IMPLANT
GAUZE SPONGE 4X4 12PLY STRL (GAUZE/BANDAGES/DRESSINGS) IMPLANT
GLOVE BIO SURGEON STRL SZ7.5 (GLOVE) ×4 IMPLANT
GLOVE BIO SURGEON STRL SZ8 (GLOVE) ×8 IMPLANT
GLOVE BIOGEL PI IND STRL 8 (GLOVE) ×2 IMPLANT
GLOVE BIOGEL PI INDICATOR 8 (GLOVE) ×2
GLOVE INDICATOR 8.0 STRL GRN (GLOVE) ×8 IMPLANT
GLOVE SURG XRAY 8.0 LX (GLOVE) ×8 IMPLANT
GOWN STRL REUS W/ TWL LRG LVL3 (GOWN DISPOSABLE) ×1 IMPLANT
GOWN STRL REUS W/ TWL XL LVL3 (GOWN DISPOSABLE) ×1 IMPLANT
GOWN STRL REUS W/TWL LRG LVL3 (GOWN DISPOSABLE) ×1
GOWN STRL REUS W/TWL XL LVL3 (GOWN DISPOSABLE) ×1
IMMOBILIZER SHDR LG LX 900803 (SOFTGOODS) IMPLANT
IMMOBILIZER SHDR SM LX WHT (SOFTGOODS) ×2 IMPLANT
KIT RM TURNOVER STRD PROC AR (KITS) ×2 IMPLANT
KIT STABILIZATION SHOULDER (MISCELLANEOUS) ×2 IMPLANT
MASK FACE SPIDER DISP (MASK) ×2 IMPLANT
NDL FRENCH SPRG EYE 1/2 CRC (NEEDLE) ×2 IMPLANT
NEEDLE FILTER BLUNT 18X 1/2SAF (NEEDLE)
NEEDLE FILTER BLUNT 18X1 1/2 (NEEDLE) IMPLANT
NS IRRIG 500ML POUR BTL (IV SOLUTION) ×2 IMPLANT
PACK ARTHROSCOPY SHOULDER (MISCELLANEOUS) ×2 IMPLANT
PLATE CLAVICLE HOOK 3.5 5H 15 (Plate) IMPLANT
PLATE CLAVICLE HOOK 3.5 5H 18 (Plate) IMPLANT
PLATE CLAVICLE HOOK 3.5 6H 18 (Plate) ×2 IMPLANT
SCREW CORTEX 3.5 10MM (Screw) IMPLANT
SCREW CORTEX 3.5 12MM (Screw) IMPLANT
SCREW CORTEX 3.5 14MM (Screw) ×3 IMPLANT
SCREW LOCK CORT ST 3.5X10 (Screw) IMPLANT
SCREW LOCK CORT ST 3.5X12 (Screw) IMPLANT
SCREW LOCK CORT ST 3.5X14 (Screw) ×3 IMPLANT
SCREW LOCK T15 FT 12X3.5X2.9X (Screw) ×1 IMPLANT
SCREW LOCK T15 FT 14X3.5X2.9X (Screw) ×1 IMPLANT
SCREW LOCKING 3.5X12 (Screw) ×1 IMPLANT
SCREW LOCKING 3.5X14 (Screw) ×1 IMPLANT
STAPLER SKIN PROX 35W (STAPLE) ×2 IMPLANT
STRIP CLOSURE SKIN 1/2X4 (GAUZE/BANDAGES/DRESSINGS) ×2 IMPLANT
SUT TIGER TAPE 7 IN WHITE (SUTURE) ×2 IMPLANT
SUT VIC AB 2-0 CT1 27 (SUTURE) ×2
SUT VIC AB 2-0 CT1 TAPERPNT 27 (SUTURE) ×2 IMPLANT
SUT VIC AB 2-0 CT2 27 (SUTURE) ×4 IMPLANT
SUT VIC AB 2-0 SH 27 (SUTURE) ×1
SUT VIC AB 2-0 SH 27XBRD (SUTURE) ×1 IMPLANT
SYR 10ML LL (SYRINGE) ×2 IMPLANT
TUBING CONNECTING 10 (TUBING) ×2 IMPLANT

## 2017-02-16 NOTE — Transfer of Care (Signed)
Immediate Anesthesia Transfer of Care Note  Patient: Sharon [name]  Procedure(s) Performed: OPEN REDUCTION INTERNAL FIXATION (ORIF) CLAVICULAR FRACTURE (Right Shoulder)  Patient Location: PACU  Anesthesia Type:General  Level of Consciousness: sedated  Airway & Oxygen Therapy: Patient Spontanous Breathing and Patient connected to face mask oxygen  Post-op Assessment: Report given to RN and Post -op Vital signs reviewed and stable  Post vital signs: Reviewed and stable  Last Vitals:  Vitals:   02/16/17 1111  BP: (!) 140/58  Pulse: (!) 59  Resp: 16  Temp: 36.9 C  SpO2: 99%    Last Pain:  Vitals:   02/16/17 1111  TempSrc: Temporal  PainSc: 0-No pain         Complications: No apparent anesthesia complications

## 2017-02-16 NOTE — OR Nursing (Signed)
Right knee skin tear.  Old left knee replacement scar

## 2017-02-16 NOTE — H&P (Signed)
Paper H&P to be scanned into permanent record. H&P reviewed and patient re-examined. No changes. 

## 2017-02-16 NOTE — Anesthesia Postprocedure Evaluation (Signed)
Anesthesia Post Note  Patient: Sharon [name]  Procedure(s) Performed: OPEN REDUCTION INTERNAL FIXATION (ORIF) CLAVICULAR FRACTURE (Right Shoulder)  Patient location during evaluation: PACU Anesthesia Type: General Level of consciousness: awake and alert Pain management: pain level controlled Vital Signs Assessment: post-procedure vital signs reviewed and stable Respiratory status: spontaneous breathing, nonlabored ventilation, respiratory function stable and patient connected to nasal cannula oxygen Cardiovascular status: blood pressure returned to baseline and stable Postop Assessment: no apparent nausea or vomiting Anesthetic complications: no     Last Vitals:  Vitals:   02/16/17 1840 02/16/17 1845  BP: 137/62 (!) 153/64  Pulse: 62 62  Resp: 13 14  Temp:    SpO2: 100% 100%    Last Pain:  Vitals:   02/16/17 1845  TempSrc:   PainSc: 2                  Karita Dralle S

## 2017-02-16 NOTE — Anesthesia Post-op Follow-up Note (Signed)
Anesthesia QCDR form completed.        

## 2017-02-16 NOTE — Anesthesia Procedure Notes (Signed)
Procedure Name: Intubation Date/Time: 02/16/2017 3:16 PM Performed by: Dionne Bucy, CRNA Pre-anesthesia Checklist: Patient identified, Patient being monitored, Timeout performed, Emergency Drugs available and Suction available Patient Re-evaluated:Patient Re-evaluated prior to induction Oxygen Delivery Method: Circle system utilized Preoxygenation: Pre-oxygenation with 100% oxygen Induction Type: IV induction, Rapid sequence and Cricoid Pressure applied Laryngoscope Size: Mac and 3 Grade View: Grade II Tube type: Oral Tube size: 7.0 mm Number of attempts: 1 Airway Equipment and Method: Stylet Placement Confirmation: ETT inserted through vocal cords under direct vision,  positive ETCO2 and breath sounds checked- equal and bilateral Secured at: 21 cm Tube secured with: Tape Dental Injury: Teeth and Oropharynx as per pre-operative assessment

## 2017-02-16 NOTE — Op Note (Signed)
02/16/2017  5:03 PM  Patient:   Sharon [name]  Pre-Op Diagnosis:   Closed comminuted displaced right distal clavicle fracture.  Post-Op Diagnosis:   Same.  Procedure:   Open reduction and internal fixation of displaced right distal clavicle fracture.  Surgeon:   Pascal Lux, MD  Assistant:   Cameron Proud, PA-C; Clearnce Sorrel, PA-S  Anesthesia:   GET  Findings:   As above.  Complications:   None  EBL:   20 cc  Fluids:   300 cc crystalloid  TT:   None  Drains:   None  Closure:   2-0 Vicryl subcuticular sutures.  Implants:   Dupuy-Synthes 18 mm 6-hole hook plate.  Brief Clinical Note:   The patient is an 84 year old female who sustained the above-noted injury nearly 2 weeks ago following a fall at her assisted living facility. The initial thought was to treat the fracture nonsurgically. However, when she presented to the office, the skin was tented, raising concern for the possibility of the bony routing out through the skin. Therefore, it was elected to proceed with surgical management of this injury. The patient presents at this time for open reduction and internal fixation of the right distal clavicle fracture.  Procedure:   The patient was brought into the operating room and lain in the supine position. After adequate general endotracheal intubation and anesthesia were obtained, the patient was repositioned in the beach chair position using the beach chair positioner. The right shoulder and upper extremity were prepped with ChloraPrep solution before being draped sterilely. Preoperative antibiotics were administered. After performing a timeout to verify the appropriate surgical site, 10 cc of quarter percent Sensorcaine with epinephrine was injected along the expected incision site to help with hemostasis. An approximately 8-10 cm curving incision was made over the clavicle, centered over the fracture. The incision was carried down through the subcutaneous tissues to expose  the platysma. This was split the length of the incision and the underlying clavicle identified. The clavipectoral fascia was divided over the fracture and subperiosteal dissection carried out sufficiently to expose the fracture. Fracture hematoma was removed using pickups and a small curette. In addition, the portion of the bone that was threatening to puncture the skin was rongeured smooth.   The fracture was reduced and temporarily secured using a bone holding clamp. After measuring, an 18 mm 6-hole hook plate was selected and applied over the fracture. This appeared to fit quite well, enabling six sites of cortical fixation sites healed to the fracture. The plate was applied over the fracture and temporarily held in place with a bone-holding clamp which also maintained fracture reduction. Three bicortical screws were placed in the shaft of the clavicle medial to the fracture. Two additional locking screws were placed lateral to the fracture in the distal clavicle to complete fracture fixation. The adequacy of fracture reduction and hardware position was verified fluoroscopically in AP and lateral projections and found to be excellent. Two cortical screws were deemed too short and were replaced with longer screws.  In addition, two 2 mm fiber tapes were tied circumferentially around the medial portion of the plate and clavicular shaft in order to try to reduce the likelihood of pullout, given the patient's age, bone quality, and inability to fully cooperate with the postoperative rehabilitation.  The wound was copiously irrigated with sterile saline solution before the clavipectoral fascia was reapproximated using 2-0 Vicryl interrupted sutures. The platysma also was closed using 2-0 Vicryl interrupted sutures before the  skin was closed using 2-0 Vicryl inverted subcuticular sutures. Benzoin and Steri-Strips are applied to the skin. An addition 20 cc of 0.25% Sensorcaine with epinephrine was injected in and  around the incision to help with postoperative analgesia before a sterile occlusive dressing was applied to the wound. The patient was placed into a shoulder immobilizer before being awakened, extubated, and returned to the recovery room in satisfactory condition after tolerating the procedure well.

## 2017-02-16 NOTE — OR Nursing (Signed)
Dressed skin tear on left elbow and left shoulder and left upper back.  Permacath in left upper chest.  Immoblizer on right arm

## 2017-02-16 NOTE — Anesthesia Preprocedure Evaluation (Signed)
Anesthesia Evaluation  Patient identified by MRN, date of birth, ID band Patient awake    Reviewed: Allergy & Precautions, NPO status , Patient's Chart, lab work & pertinent test results  History of Anesthesia Complications (+) PROLONGED EMERGENCE and history of anesthetic complications  Airway Mallampati: II  TM Distance: >3 FB Neck ROM: Full    Dental no notable dental hx.    Pulmonary sleep apnea , neg COPD, former smoker,    breath sounds clear to auscultation- rhonchi (-) wheezing      Cardiovascular hypertension, Pt. on medications + CAD, + Past MI and +CHF  + pacemaker  Rhythm:Regular Rate:Normal - Systolic murmurs and - Diastolic murmurs Echo 1/57/26: SEVERE LV SYSTOLIC DYSFUNCTION (See above) WITH SEVERE LVH MODERATE VALVULAR REGURGITATION (See above) NO VALVULAR STENOSIS MILD PHTN   Neuro/Psych PSYCHIATRIC DISORDERS    GI/Hepatic negative GI ROS, Neg liver ROS,   Endo/Other  negative endocrine ROSneg diabetes  Renal/GU ESRF and DialysisRenal disease (last dialysis 05/08/16)     Musculoskeletal  (+) Arthritis ,   Abdominal (+) - obese,   Peds  Hematology negative hematology ROS (+)   Anesthesia Other Findings Past Medical History: No date: Arthritis No date: CAD (coronary artery disease) No date: Cancer ([place])     Comment: skin No date: Cardiomyopathy ([place]) No date: Depression 2010: IBS (irritable bowel syndrome) July 2012: Kidney failure     Comment: Hemodialysis 3xweek No date: Kidney failure No date: Meniere disease No date: Meniere's disease No date: Myocardial infarction ([place]) No date: OSA (obstructive sleep apnea)     Comment: CPAP No date: Peritoneal dialysis status (HCC) No date: Presence of permanent cardiac pacemaker   Reproductive/Obstetrics                             Anesthesia Physical  Anesthesia Plan  ASA: IV  Anesthesia Plan: General    Post-op Pain Management:    Induction: Intravenous, Rapid sequence and Cricoid pressure planned  PONV Risk Score and Plan: 3 and Ondansetron and Dexamethasone  Airway Management Planned: Natural Airway and Oral ETT  Additional Equipment:   Intra-op Plan:   Post-operative Plan: Extubation in OR  Informed Consent: I have reviewed the patients History and Physical, chart, labs and discussed the procedure including the risks, benefits and alternatives for the proposed anesthesia with the patient or authorized representative who has indicated his/her understanding and acceptance.   Dental advisory given  Plan Discussed with: Anesthesiologist and CRNA  Anesthesia Plan Comments:         Lab Results  Component Value Date   WBC 6.3 02/13/2017   HGB 8.5 (L) 02/16/2017   HCT 25.0 (L) 02/16/2017   MCV 98.1 02/13/2017   PLT 268 02/13/2017    Anesthesia Quick Evaluation

## 2017-02-17 ENCOUNTER — Other Ambulatory Visit: Payer: Self-pay

## 2017-02-17 ENCOUNTER — Ambulatory Visit: Admission: RE | Admit: 2017-02-17 | Payer: Medicare Other | Source: Ambulatory Visit | Admitting: Vascular Surgery

## 2017-02-17 ENCOUNTER — Encounter: Admission: RE | Payer: Self-pay | Source: Ambulatory Visit

## 2017-02-17 DIAGNOSIS — S42031A Displaced fracture of lateral end of right clavicle, initial encounter for closed fracture: Secondary | ICD-10-CM | POA: Diagnosis not present

## 2017-02-17 LAB — BASIC METABOLIC PANEL
Anion gap: 14 (ref 5–15)
BUN: 52 mg/dL — AB (ref 6–20)
CALCIUM: 8.8 mg/dL — AB (ref 8.9–10.3)
CO2: 23 mmol/L (ref 22–32)
Chloride: 97 mmol/L — ABNORMAL LOW (ref 101–111)
Creatinine, Ser: 3.73 mg/dL — ABNORMAL HIGH (ref 0.44–1.00)
GFR calc Af Amer: 12 mL/min — ABNORMAL LOW (ref >60)
GFR, EST NON AFRICAN AMERICAN: 10 mL/min — AB (ref >60)
GLUCOSE: 123 mg/dL — AB (ref 65–99)
Potassium: 4.3 mmol/L (ref 3.5–5.1)
SODIUM: 134 mmol/L — AB (ref 135–145)

## 2017-02-17 LAB — CBC WITH DIFFERENTIAL/PLATELET
BASOS ABS: 0 10*3/uL (ref 0–0.1)
BASOS PCT: 0 %
EOS ABS: 0 10*3/uL (ref 0–0.7)
EOS PCT: 0 %
HCT: 26.4 % — ABNORMAL LOW (ref 35.0–47.0)
Hemoglobin: 8.7 g/dL — ABNORMAL LOW (ref 12.0–16.0)
Lymphocytes Relative: 11 %
Lymphs Abs: 0.8 10*3/uL — ABNORMAL LOW (ref 1.0–3.6)
MCH: 32.8 pg (ref 26.0–34.0)
MCHC: 33 g/dL (ref 32.0–36.0)
MCV: 99.5 fL (ref 80.0–100.0)
MONO ABS: 0.6 10*3/uL (ref 0.2–0.9)
Monocytes Relative: 8 %
Neutro Abs: 6.1 10*3/uL (ref 1.4–6.5)
Neutrophils Relative %: 81 %
PLATELETS: 246 10*3/uL (ref 150–440)
RBC: 2.65 MIL/uL — ABNORMAL LOW (ref 3.80–5.20)
RDW: 15.5 % — ABNORMAL HIGH (ref 11.5–14.5)
WBC: 7.6 10*3/uL (ref 3.6–11.0)

## 2017-02-17 SURGERY — DIALYSIS/PERMA CATHETER REMOVAL
Anesthesia: LOCAL

## 2017-02-17 MED ORDER — EPOETIN ALFA 10000 UNIT/ML IJ SOLN
10000.0000 [IU] | INTRAMUSCULAR | Status: DC
  Administered 2017-02-17: 10000 [IU] via INTRAVENOUS

## 2017-02-17 MED ORDER — TRAMADOL HCL 50 MG PO TABS: 50.0000 mg | ORAL_TABLET | Freq: Four times a day (QID) | ORAL | 0 refills | Status: DC | PRN

## 2017-02-17 MED ORDER — LIDOCAINE 5 % EX PTCH
1.0000 | MEDICATED_PATCH | CUTANEOUS | Status: DC
  Administered 2017-02-17: 1 via TRANSDERMAL
  Filled 2017-02-17: qty 1

## 2017-02-17 MED ORDER — LIDOCAINE 5 % EX PTCH: 1.0000 | MEDICATED_PATCH | CUTANEOUS | 0 refills | Status: DC

## 2017-02-17 MED ORDER — LIDOCAINE 5 % EX PTCH: 1.0000 | MEDICATED_PATCH | CUTANEOUS | Status: DC

## 2017-02-17 NOTE — Progress Notes (Signed)
  Subjective: 1 Day Post-Op Procedure(s) (LRB): OPEN REDUCTION INTERNAL FIXATION (ORIF) CLAVICULAR FRACTURE (Right) Patient reports pain as severe.   Patient is well, but has had some minor complaints of pain.  Will undergo dialysis today. Plan is to go rehab following dialysis later today. Negative for chest pain and shortness of breath Fever: no Gastrointestinal:Negative for nausea and vomiting  Objective: Vital signs in last 24 hours: Temp:  [97.4 F (36.3 C)-98.5 F (36.9 C)] 97.4 F (36.3 C) (01/23 0747) Pulse Rate:  [58-77] 59 (01/23 0747) Resp:  [12-19] 16 (01/23 0158) BP: (131-188)/(50-76) 133/58 (01/23 0747) SpO2:  [89 %-100 %] 100 % (01/23 0747) Weight:  [44.5 kg (98 lb)] 44.5 kg (98 lb) (01/22 1111)  Intake/Output from previous day:  Intake/Output Summary (Last 24 hours) at 02/17/2017 0754 Last data filed at 02/17/2017 0419 Gross per 24 hour  Intake 970.83 ml  Output 20 ml  Net 950.83 ml    Intake/Output this shift: No intake/output data recorded.  Labs: Recent Labs    02/16/17 1127 02/17/17 0449  HGB 8.5* 8.7*   Recent Labs    02/16/17 1127 02/17/17 0449  WBC  --  7.6  RBC  --  2.65*  HCT 25.0* 26.4*  PLT  --  246   Recent Labs    02/16/17 1127 02/17/17 0449  NA 137 134*  K 3.3* 4.3  CL  --  97*  CO2  --  23  BUN  --  52*  CREATININE  --  3.73*  GLUCOSE 117* 123*  CALCIUM  --  8.8*   No results for input(s): LABPT, INR in the last 72 hours.   EXAM General - Patient is Alert and Appropriate Extremity - ABD soft Dorsiflexion/Plantar flexion intact Incision: scant drainage No cellulitis present Dressing/Incision - blood tinged drainage, honeycomb dressing intact. Motor Function - intact, moving foot and toes well on exam.  Abdomen soft with normal BS.  Past Medical History:  Diagnosis Date  . Anemia   . Arthritis   . CAD (coronary artery disease)   . Cancer ([place])    skin  . Cardiomyopathy ([place])   . CHF (congestive heart  failure) ([place])   . Depression   . IBS (irritable bowel syndrome) 2010  . Kidney failure July 2012   Hemodialysis 3xweek  . Kidney failure   . Meniere disease   . Meniere's disease   . Myocardial infarction ([place])   . OSA (obstructive sleep apnea)    CPAP  . Peritoneal dialysis status ([place])   . Presence of IVC filter 2019  . Presence of permanent cardiac pacemaker   . Renal insufficiency     Assessment/Plan: 1 Day Post-Op Procedure(s) (LRB): OPEN REDUCTION INTERNAL FIXATION (ORIF) CLAVICULAR FRACTURE (Right) Active Problems:   Displaced fracture of lateral end of right clavicle, initial encounter for closed fracture  Estimated body mass index is 16.82 kg/m as calculated from the following:   Height as of this encounter: 5\' 4"  (1.626 m).   Weight as of this encounter: 44.5 kg (98 lb). Advance diet Up with therapy   Labs reviewed this AM. Cr 3.73, BUN 52.  Plan is for dialysis today. Pt unable to take narcotics but has significant pain. Plan will be for discharge today following dialysis.  DVT Prophylaxis - Foot Pumps and TED hose Nonweight bearing to right arm.  Raquel Willean Schurman, PA-C [facility] Orthopaedic Surgery 02/17/2017, 7:54 AM

## 2017-02-17 NOTE — Progress Notes (Addendum)
LCSW following for discharge planning  Patient is a LTC resident from [facility]. Patient will return to LTC facility this afternoon after she completes her dialysis.   LCSW has sent over all paperwork to facility for review of discharge via [place]. LCSW will arrange for transport by EMS.  Call placed to facility, unable to reach, but will continue to try and reach to assist with DC.  LCSW spoke with facility. Agreeable for return and expecting patient to return this afternoon to LTC facility.  Reports her brother Ed to be contacted and LCSW will make him aware as facility reports he is involved.  Lane Hacker, MSW Clinical Social Work: [employer] Coverage for :  [phone]

## 2017-02-17 NOTE — Progress Notes (Signed)
[facility]  ROUNDING NOTE   Subjective:  Patient seen and evaluated during hemodialysis. Appears to be tolerating well. Underwent repair of her right clavicle. Appears to be in pain at the moment.   Objective:  Vital signs in last 24 hours:  Temp:  [97.4 F (36.3 C)-98.6 F (37 C)] 98.6 F (37 C) (01/23 1010) Pulse Rate:  [58-77] 59 (01/23 1130) Resp:  [12-19] 13 (01/23 1130) BP: (120-188)/(49-76) 128/49 (01/23 1130) SpO2:  [89 %-100 %] 100 % (01/23 1130)  Weight change:  Filed Weights   02/16/17 1111  Weight: 44.5 kg (98 lb)    Intake/Output: I/O last 3 completed shifts: In: 970.8 [P.O.:120; I.V.:730.8; IV Piggyback:120] Out: 20 [Blood:20]   Intake/Output this shift:  Total I/O In: 120 [P.O.:120] Out: -   Physical Exam: General: No acute distress  Head: Normocephalic, atraumatic. Moist oral mucosal membranes  Eyes: Anicteric  Neck: Supple, trachea midline  Lungs:  Clear to auscultation, normal effort  Heart: S1S2 no rubs  Abdomen:  Soft, nontender, bowel sounds present  Extremities: Trace peripheral edema.  Neurologic: Awake, alert, following commands  Skin: No lesions  Access: Left IJ permcath    Basic Metabolic Panel: Recent Labs  Lab 02/13/17 1307 02/16/17 1127 02/17/17 0449  NA 134* 137 134*  K 3.5 3.3* 4.3  CL 99*  --  97*  CO2 24  --  23  GLUCOSE 120* 117* 123*  BUN 40*  --  52*  CREATININE 2.57*  --  3.73*  CALCIUM 8.8*  --  8.8*    Liver Function Tests: No results for input(s): AST, ALT, ALKPHOS, BILITOT, PROT, ALBUMIN in the last 168 hours. No results for input(s): LIPASE, AMYLASE in the last 168 hours. No results for input(s): AMMONIA in the last 168 hours.  CBC: Recent Labs  Lab 02/13/17 1307 02/16/17 1127 02/17/17 0449  WBC 6.3  --  7.6  NEUTROABS 5.0  --  6.1  HGB 8.3* 8.5* 8.7*  HCT 24.9* 25.0* 26.4*  MCV 98.1  --  99.5  PLT 268  --  246    Cardiac Enzymes: No results for input(s): CKTOTAL, CKMB,  CKMBINDEX, TROPONINI in the last 168 hours.  BNP: Invalid input(s): POCBNP  CBG: No results for input(s): GLUCAP in the last 168 hours.  Microbiology: Results for orders placed or performed during the hospital encounter of 02/03/17  MRSA PCR Screening     Status: None   Collection Time: 02/03/17  6:24 PM  Result Value Ref Range Status   MRSA by PCR NEGATIVE NEGATIVE Final    Comment:        The GeneXpert MRSA Assay (FDA approved for NASAL specimens only), is one component of a comprehensive MRSA colonization surveillance program. It is not intended to diagnose MRSA infection nor to guide or monitor treatment for MRSA infections. Performed at [facility], [address] [place], [place] [address]     Coagulation Studies: No results for input(s): LABPROT, INR in the last 72 hours.  Urinalysis: No results for input(s): COLORURINE, LABSPEC, PHURINE, GLUCOSEU, HGBUR, BILIRUBINUR, KETONESUR, PROTEINUR, UROBILINOGEN, NITRITE, LEUKOCYTESUR in the last 72 hours.  Invalid input(s): APPERANCEUR    Imaging: Dg Clavicle Right  Result Date: 02/16/2017 CLINICAL DATA:  Open reduction internal fixation for clavicle fracture EXAM: RIGHT CLAVICLE - 2+ VIEWS COMPARISON:  Intraoperative study obtained earlier in the day FINDINGS: Frontal and tilt frontal images obtained. There is screw and plate fixation for a fracture of the distal right clavicle.  Alignment anatomic at the fracture site. No fracture or dislocation. Joint spaces appear unremarkable. Visualized right lung clear. There is aortic atherosclerosis. IMPRESSION: Screw and plate fixation for fracture of lateral right clavicle with alignment anatomic at the fracture site. No new fracture. No dislocation. There is aortic atherosclerosis. Aortic Atherosclerosis (ICD10-I70.0). Electronically Signed   By: Lowella Grip III M.D.   On: 02/16/2017 19:35   Dg Clavicle Right  Result Date: 02/16/2017 CLINICAL DATA:  Open  reduction internal fixation for fracture EXAM: DG C-ARM 61-120 MIN; RIGHT CLAVICLE - 2+ VIEWS COMPARISON:  February 13, 2017 FLUOROSCOPY TIME:  0 minutes 17 seconds; 2 acquired images FINDINGS: Frontal and tilt frontal images show screw and plate fixation for a fracture of the distal clavicle. Alignment at fracture site is anatomic. No dislocation evident. No new fracture. Joint spaces appear unremarkable. IMPRESSION: Alignment anatomic at fracture site. Currently no new fracture or dislocation. Joint spaces unremarkable. Electronically Signed   By: Lowella Grip III M.D.   On: 02/16/2017 19:14   Dg C-arm 1-60 Min  Result Date: 02/16/2017 CLINICAL DATA:  Open reduction internal fixation for fracture EXAM: DG C-ARM 61-120 MIN; RIGHT CLAVICLE - 2+ VIEWS COMPARISON:  February 13, 2017 FLUOROSCOPY TIME:  0 minutes 17 seconds; 2 acquired images FINDINGS: Frontal and tilt frontal images show screw and plate fixation for a fracture of the distal clavicle. Alignment at fracture site is anatomic. No dislocation evident. No new fracture. Joint spaces appear unremarkable. IMPRESSION: Alignment anatomic at fracture site. Currently no new fracture or dislocation. Joint spaces unremarkable. Electronically Signed   By: Lowella Grip III M.D.   On: 02/16/2017 19:14     Medications:   . sodium chloride 50 mL/hr (02/16/17 2004)  .  ceFAZolin (ANCEF) IV Stopped (02/17/17 0241)   . acetaminophen  1,000 mg Oral Q6H  . docusate sodium  100 mg Oral BID  . feeding supplement (NEPRO CARB STEADY)  237 mL Oral BID BM  . levothyroxine  25 mcg Oral QAC breakfast  . lidocaine  1 patch Transdermal Q24H  . lidocaine-prilocaine  1 application Topical Q M,W,F-HD  . metoprolol succinate  25 mg Oral Daily  . multivitamin  1 tablet Oral Daily  . ramipril  2.5 mg Oral QHS  . sertraline  150 mg Oral Daily  . [START ON 02/22/2017] Vitamin D (Ergocalciferol)  50,000 Units Oral Q Mon   acetaminophen **OR** acetaminophen,  bisacodyl, diphenhydrAMINE, diphenoxylate-atropine, magnesium hydroxide, metoCLOPramide **OR** metoCLOPramide (REGLAN) injection, metoCLOPramide, ondansetron **OR** ondansetron (ZOFRAN) IV, polyethylene glycol, sodium phosphate, traMADol  Assessment/ Plan:  84 y.o. female with end stage renal disease on hemodialysis, coronary artery disease, pacemaker placement, meniere's disease, congestive heart failure, peripheral vascular disease, multiple falls and fractures.   MWF [facility]   1. End Stage Renal Disease: Patient seen and evaluated during hemodialysis.  She appears to be tolerating this quite well.  We will continue her normal Monday, Wednesday, Friday schedule.  Given the fact that she has end-stage renal disease status we will go ahead and stop IV fluid hydration.  2.  Anemia of chronic kidney disease.  Hemoglobin low at 8.7.  We will start the patient on Epogen 10,000 units IV with dialysis.  3.  Secondary hyperparathyroidism.  Check serum phosphorus with dialysis today.  Patient not taking binder therapy at the moment.  4.  Hypertension.  Continue metoprolol as well as ramipril.     LOS: 0 Antwion Carpenter 1/23/201912:13 PM

## 2017-02-17 NOTE — Progress Notes (Signed)
HD started. 

## 2017-02-17 NOTE — Progress Notes (Signed)
Called report to [place], LPN at [facility]. Answered all questions. Brother notified of return from Dialysis and discharging. Awaiting brother's arrival prior to calling EMS per request

## 2017-02-17 NOTE — Progress Notes (Signed)
OT Cancellation Note  Patient Details [name] MRN: [id number] DOB: 1926/06/12   Cancelled Treatment:    Reason Eval/Treat Not Completed: Patient declined, no reason specified;Patient at procedure or test/ unavailable. Order received, chart reviewed. Upon attempt, spoke with PT exiting room. Pt declining therapy at this time. Transport then arrived to take pt to dialysis. Will re-attempt OT evaluation at later date/time as pt is available, medically appropriate, and as schedule permits.   Jeni Salles, MPH, MS, OTR/L ascom [phone] 02/17/17, 9:47 AM

## 2017-02-17 NOTE — Care Management Obs Status (Signed)
[place] NOTIFICATION   Patient Details  [name] MRN: [id number] Date of Birth: [birth year]   Medicare Observation Status Notification Given:  Yes    Jolly Mango, RN 02/17/2017, 3:45 PM

## 2017-02-17 NOTE — NC FL2 (Signed)
[place] LEVEL OF CARE SCREENING TOOL     IDENTIFICATION  Patient [name] Birthdate: [birth year] Sex: female Admission Date (Current Location): 02/16/2017  [facility] and [place] Number:  Engineering geologist and Address:  [facility], [address] [place], [place] [address]      Provider Number: [id number]  Attending Physician Name and Address:  Corky Mull, MD  Relative Name and Phone Number:       Current Level of Care: Hospital Recommended Level of Care: [place] Prior Approval Number:    Date Approved/Denied:   PASRR Number:    Discharge Plan: SNF    Current Diagnoses: Patient Active Problem List   Diagnosis Date Noted  . Displaced fracture of lateral end of right clavicle, initial encounter for closed fracture 02/16/2017  . FTT (failure to thrive) in adult 02/03/2017  . ESRD on hemodialysis ([place])   . Pleural effusion on right   . Palliative care by specialist   . Pulmonary embolism ([place]) 01/02/2017  . Syncope 01/01/2017  . Vision changes 08/27/2016  . Closed fracture of neck of femur ([place]) 05/26/2016  . Hip fracture ([place]) 05/10/2016  . Falls 04/27/2016  . Head injury 04/27/2016  . Age-related osteoporosis with current pathological fracture with routine healing 04/24/2016  . Recurrent falls 04/24/2016  . Mild protein-calorie malnutrition ([place]) 04/24/2016  . ESRD on dialysis ([place]) 04/10/2016  . Periprosthetic fracture around internal prosthetic left knee joint 01/26/2016  . Pressure injury of skin 12/15/2015  . Closed left subtrochanteric femur fracture ([place]) 12/15/2015  . Femur fracture, left ([place]) 12/14/2015  . Meniere disease   . Loss of weight 10/21/2015  . Clinical depression 06/20/2015  . Dysphagia, unspecified 05/24/2015  . Imbalance 04/22/2015  . Chronic pain syndrome 05/22/2014  . Insomnia 03/13/2014  . Anxiety state 03/13/2014  . Chronic systolic CHF (congestive heart  failure), NYHA class 3 ([place]) 01/15/2014  . OSA (obstructive sleep apnea) 01/15/2014  . Chronic kidney disease (CKD), stage V ([place]) 01/15/2014  . Basal cell carcinoma of neck 11/14/2013  . DDD (degenerative disc disease), cervical 11/07/2013  . Cervical radiculitis 10/16/2013  . Benign essential hypertension 09/12/2013  . Memory loss 09/12/2013  . Systolic heart failure, chronic ([place]) 05/12/2013  . Goals of care, counseling/discussion 11/10/2012  . Sinus node dysfunction ([place]) 08/01/2012  . Low back pain 08/01/2012  . Cervical spine pain 05/02/2012  . Irritable bowel syndrome 11/02/2011  . Depression 04/01/2011  . Hypertension 04/01/2011    Orientation RESPIRATION BLADDER Height & Weight     Self, Situation  O2(2L) Incontinent Weight: 98 lb (44.5 kg) Height:  5\' 4"  (162.6 cm)  BEHAVIORAL SYMPTOMS/MOOD NEUROLOGICAL BOWEL NUTRITION STATUS      Continent Diet  AMBULATORY STATUS COMMUNICATION OF NEEDS Skin   Total Care Verbally Surgical wounds, Bruising(R Shoulder)                       Personal Care Assistance Level of Assistance  Bathing, Feeding, Dressing Bathing Assistance: Maximum assistance Feeding assistance: Maximum assistance Dressing Assistance: Maximum assistance     Functional Limitations Info  Hearing          SPECIAL CARE FACTORS FREQUENCY  PT (By licensed PT), OT (By licensed OT)                    Contractures Contractures Info: Not present    Additional Factors Info  Code Status, Allergies Code Status  Info: Full Code Allergies Info: Statins, Codeine Sulfate, Codeine, Effexor Venlafaxine, Ezetimibe-simvastatin           Current Medications (02/17/2017):  This is the current hospital active medication list Current Facility-Administered Medications  Medication Dose Route Frequency Provider Last Rate Last Dose  . 0.9 %  sodium chloride infusion   Intravenous Continuous Poggi, Marshall Cork, MD 50 mL/hr at 02/16/17 2004 50 mL/hr at 02/16/17 2004   . acetaminophen (TYLENOL) tablet 650 mg  650 mg Oral Q4H PRN Poggi, Marshall Cork, MD       Or  . acetaminophen (TYLENOL) suppository 650 mg  650 mg Rectal Q4H PRN Poggi, Marshall Cork, MD      . acetaminophen (TYLENOL) tablet 1,000 mg  1,000 mg Oral Q6H Poggi, Marshall Cork, MD   1,000 mg at 02/17/17 0617  . bisacodyl (DULCOLAX) suppository 10 mg  10 mg Rectal Daily PRN Poggi, Marshall Cork, MD      . ceFAZolin (ANCEF) 1 g in dextrose 5 % 50 mL IVPB  1 g Intravenous Q6H Poggi, Marshall Cork, MD   Stopped at 02/17/17 0241  . diphenhydrAMINE (BENADRYL) 12.5 MG/5ML elixir 12.5-25 mg  12.5-25 mg Oral Q4H PRN Poggi, Marshall Cork, MD      . diphenoxylate-atropine (LOMOTIL) 2.5-0.025 MG per tablet 1 tablet  1 tablet Oral QID PRN Poggi, Marshall Cork, MD      . docusate sodium (COLACE) capsule 100 mg  100 mg Oral BID Corky Mull, MD   100 mg at 02/16/17 2024  . feeding supplement (NEPRO CARB STEADY) liquid 237 mL  237 mL Oral BID BM Poggi, Marshall Cork, MD      . levothyroxine (SYNTHROID, LEVOTHROID) tablet 25 mcg  25 mcg Oral QAC breakfast Poggi, Marshall Cork, MD      . lidocaine (LIDODERM) 5 % 1 patch  1 patch Transdermal Q24H Lattie Corns, PA-C   1 patch at 02/17/17 1004  . lidocaine-prilocaine (EMLA) cream 1 application  1 application Topical Q M,W,F-HD Poggi, Marshall Cork, MD      . magnesium hydroxide (MILK OF MAGNESIA) suspension 30 mL  30 mL Oral Daily PRN Poggi, Marshall Cork, MD      . metoCLOPramide (REGLAN) tablet 5-10 mg  5-10 mg Oral Q8H PRN Poggi, Marshall Cork, MD       Or  . metoCLOPramide (REGLAN) injection 5-10 mg  5-10 mg Intravenous Q8H PRN Poggi, Marshall Cork, MD      . metoCLOPramide (REGLAN) tablet 5 mg  5 mg Oral Q8H PRN Poggi, Marshall Cork, MD      . metoprolol succinate (TOPROL-XL) 24 hr tablet 25 mg  25 mg Oral Daily Poggi, Marshall Cork, MD      . multivitamin (RENA-VIT) tablet 1 tablet  1 tablet Oral Daily Poggi, Marshall Cork, MD   1 tablet at 02/16/17 2024  . ondansetron (ZOFRAN) tablet 4 mg  4 mg Oral Q6H PRN Poggi, Marshall Cork, MD       Or  . ondansetron (ZOFRAN)  injection 4 mg  4 mg Intravenous Q6H PRN Poggi, Marshall Cork, MD      . polyethylene glycol (MIRALAX / GLYCOLAX) packet 17 g  17 g Oral Daily PRN Poggi, Marshall Cork, MD      . ramipril (ALTACE) capsule 2.5 mg  2.5 mg Oral QHS Poggi, Marshall Cork, MD   2.5 mg at 02/16/17 2025  . sertraline (ZOLOFT) tablet 150 mg  150 mg Oral Daily Poggi, Marshall Cork, MD  150 mg at 02/16/17 2024  . sodium phosphate (FLEET) 7-19 GM/118ML enema 1 enema  1 enema Rectal Once PRN Poggi, Marshall Cork, MD      . traMADol Veatrice Bourbon) tablet 50 mg  50 mg Oral Q6H PRN Poggi, Marshall Cork, MD   50 mg at 02/17/17 1132  . [START ON 02/22/2017] Vitamin D (Ergocalciferol) (DRISDOL) capsule 50,000 Units  50,000 Units Oral Q Mon Poggi, Marshall Cork, MD         Discharge Medications: Please see discharge summary for a list of discharge medications.  Relevant Imaging Results:  Relevant Lab Results:   Additional Information Hemodialysis   SSN: [id number]  [place], LCSW

## 2017-02-17 NOTE — Progress Notes (Signed)
HD complete 

## 2017-02-17 NOTE — Progress Notes (Signed)
Pre HD  

## 2017-02-17 NOTE — Progress Notes (Signed)
Initial Nutrition Assessment  DOCUMENTATION CODES:   Underweight  INTERVENTION:   - Continue Nepro Shake po BID, each supplement provides 425 kcal and 19 grams protein - Encourage PO intake and assist pt as needed with opening meal containers  NUTRITION DIAGNOSIS:   Inadequate oral intake related to poor appetite as evidenced by per patient/family report.  GOAL:   Patient will meet greater than or equal to 90% of their needs  MONITOR:   PO intake, Supplement acceptance, Weight trends  REASON FOR ASSESSMENT:   Other (Comment)(Low BMI)   ASSESSMENT:   85 year old pt with PMH significant for HTN, CAD, past MI, CHF, IBS, ESRD on hemodialysis. Pt is s/p open reduction and internal fixation of displaced right distal clavicle fx on 02/16/17. Plan to d/c pt to rehab.  Spoke with pt at bedside who reports pain in her shoulder and difficulty reaching her breakfast tray. Adjusted pt's bed and opened breakfast containers. Ordered pt grits and hot chocolate.  Pt states her appetite has improved drastically since taking "the medication that increases my appetite." Pt states that PTA, her appetite was poor and she would eat two meals each day. Pt reports making sure she "ate the right things." Pt unable to provide more detailed recall.  Pt reports her UBW as 145-150 lbs and that she last weighed this over 1 year ago. Pt states she has lost 40 lbs. Per weight history in chart, pt weighed 104 lbs in August 2018. This corresponds with a 5.7% weight loss which is insignificant for timeframe. No earlier weights recorded.  Suspect some degree of malnutrition but unable to confirm at this time. Unable to perform NFPE as pt was eating breakfast at time of visit.  Medications reviewed and include: Colace, Synthroid, renale MVI, vitamin D  Labs reviewed: BUN 52 (H), creatinine 3.73 (H), calcium 8.8 (L), glucose 123 (H)  NUTRITION - FOCUSED PHYSICAL EXAM:    Most Recent Value  Orbital Region   Unable to assess  Upper Arm Region  Unable to assess  Thoracic and Lumbar Region  Unable to assess  Buccal Region  Unable to assess  [place] Region  Unable to assess  Clavicle Bone Region  Unable to assess  Clavicle and Acromion Bone Region  Unable to assess  Scapular Bone Region  Unable to assess  Dorsal Hand  Unable to assess  Patellar Region  Unable to assess  Anterior Thigh Region  Unable to assess  Posterior Calf Region  Unable to assess  Edema (RD Assessment)  Unable to assess  Hair  Unable to assess  Eyes  Unable to assess  Mouth  Unable to assess  Skin  Unable to assess  Nails  Unable to assess       Diet Order:  Diet heart healthy/carb modified Room service appropriate? Yes; Fluid consistency: Thin  EDUCATION NEEDS:   No education needs have been identified at this time  Skin:  Skin Assessment: Reviewed RN Assessment  Last BM:  02/17/17 Large Type 5  Height:   Ht Readings from Last 1 Encounters:  02/16/17 5\' 4"  (1.626 m)    Weight:   Wt Readings from Last 1 Encounters:  02/16/17 98 lb (44.5 kg)    Ideal Body Weight:  54.5 kg  BMI:  Body mass index is 16.82 kg/m.  Estimated Nutritional Needs:   Kcal:  1200-1400 kcal/day  Protein:  55-65 grams/day  Fluid:  >1.3 L/day   Gaynell Face, MS, RD, LDN Pager: [phone] Weekend/After Hours: [phone]

## 2017-02-17 NOTE — Progress Notes (Signed)
  PT Cancellation Note  Patient Details [name] MRN: [id number] DOB: 1926-12-10   Cancelled Treatment:    Reason Eval/Treat Not Completed: Patient at procedure or test/unavailable. Consult received and chart reviewed. Upon first attempt, pt refusing therapy, reporting she doesn't want to get out of bed as her pain just settled. Pt adamant about not participating despite encouragement from therapist. On 2nd attempt, pt out of room for HD. Spoke to CM about discharge planning, pt to return to SNF after HD this date. If further need arise, please inform.   Jayvyn Haselton 02/17/2017, 10:04 AM Greggory Stallion, PT, DPT [phone]

## 2017-02-17 NOTE — Discharge Instructions (Signed)
Diet: As you were doing prior to hospitalization   Shower:  May shower but keep the wounds dry, use an occlusive plastic wrap, NO SOAKING IN TUB.  If the bandage gets wet, change with a clean dry gauze.  Dressing:  You may change your dressing as needed. Change the dressing with sterile gauze dressing.    Activity:  Increase activity slowly as tolerated, but follow the weight bearing instructions below.  No lifting or driving for 6 weeks.  Weight Bearing:   Weight bearing as tolerated to right upper extremity.  To prevent constipation: you may use a stool softener such as -  Colace (over the counter) 100 mg by mouth twice a day  Drink plenty of fluids (prune juice may be helpful) and high fiber foods Miralax (over the counter) for constipation as needed.    Itching:  If you experience itching with your medications, try taking only a single pain pill, or even half a pain pill at a time.  You may take up to 10 pain pills per day, and you can also use benadryl over the counter for itching or also to help with sleep.   Precautions:  If you experience chest pain or shortness of breath - call 911 immediately for transfer to the hospital emergency department!!  If you develop a fever greater that 101 F, purulent drainage from wound, increased redness or drainage from wound, or calf pain-Call [place]                                              Follow- Up Appointment:  Please call for an appointment to be seen in 2 weeks at [facility]

## 2017-02-17 NOTE — Discharge Summary (Signed)
Physician Discharge Summary  Patient ID: Sharon [name] MRN: [id number] DOB/AGE: [id number] 82 y.o.  Admit date: 02/16/2017 Discharge date: 02/17/2017  Admission Diagnoses:  closed displaced fracture of acromial end of right clavicle  Discharge Diagnoses: Patient Active Problem List   Diagnosis Date Noted  . Displaced fracture of lateral end of right clavicle, initial encounter for closed fracture 02/16/2017  . FTT (failure to thrive) in adult 02/03/2017  . ESRD on hemodialysis ([place])   . Pleural effusion on right   . Palliative care by specialist   . Pulmonary embolism ([place]) 01/02/2017  . Syncope 01/01/2017  . Vision changes 08/27/2016  . Closed fracture of neck of femur ([place]) 05/26/2016  . Hip fracture ([place]) 05/10/2016  . Falls 04/27/2016  . Head injury 04/27/2016  . Age-related osteoporosis with current pathological fracture with routine healing 04/24/2016  . Recurrent falls 04/24/2016  . Mild protein-calorie malnutrition ([place]) 04/24/2016  . ESRD on dialysis ([place]) 04/10/2016  . Periprosthetic fracture around internal prosthetic left knee joint 01/26/2016  . Pressure injury of skin 12/15/2015  . Closed left subtrochanteric femur fracture ([place]) 12/15/2015  . Femur fracture, left ([place]) 12/14/2015  . Meniere disease   . Loss of weight 10/21/2015  . Clinical depression 06/20/2015  . Dysphagia, unspecified 05/24/2015  . Imbalance 04/22/2015  . Chronic pain syndrome 05/22/2014  . Insomnia 03/13/2014  . Anxiety state 03/13/2014  . Chronic systolic CHF (congestive heart failure), NYHA class 3 ([place]) 01/15/2014  . OSA (obstructive sleep apnea) 01/15/2014  . Chronic kidney disease (CKD), stage V ([place]) 01/15/2014  . Basal cell carcinoma of neck 11/14/2013  . DDD (degenerative disc disease), cervical 11/07/2013  . Cervical radiculitis 10/16/2013  . Benign essential hypertension 09/12/2013  . Memory loss 09/12/2013  . Systolic heart failure, chronic ([place]) 05/12/2013  . Goals of  care, counseling/discussion 11/10/2012  . Sinus node dysfunction ([place]) 08/01/2012  . Low back pain 08/01/2012  . Cervical spine pain 05/02/2012  . Irritable bowel syndrome 11/02/2011  . Depression 04/01/2011  . Hypertension 04/01/2011  Closed comminuted displaced right distal clavicle fracture  Past Medical History:  Diagnosis Date  . Anemia   . Arthritis   . CAD (coronary artery disease)   . Cancer ([place])    skin  . Cardiomyopathy ([place])   . CHF (congestive heart failure) ([place])   . Depression   . IBS (irritable bowel syndrome) 2010  . Kidney failure July 2012   Hemodialysis 3xweek  . Kidney failure   . Meniere disease   . Meniere's disease   . Myocardial infarction ([place])   . OSA (obstructive sleep apnea)    CPAP  . Peritoneal dialysis status ([place])   . Presence of IVC filter 2019  . Presence of permanent cardiac pacemaker   . Renal insufficiency    Transfusion: None.   Consultants (if any):   Discharged Condition: Improved  Hospital Course: Sharon [name] is an 84 y.o. female who was admitted 02/16/2017 with a diagnosis of a closed comminuted, displaced right distal clavicle fracture and went to the operating room on 02/16/2017 and underwent the above named procedures.    Surgeries: Procedure(s): OPEN REDUCTION INTERNAL FIXATION (ORIF) CLAVICULAR FRACTURE on 02/16/2017 Patient tolerated the surgery well. Taken to PACU where she was stabilized and then transferred to the orthopedic floor.  Foot pumps applied bilaterally at 80 mm. Heels elevated on bed with rolled towels. No evidence of DVT. Negative [place]. Physical therapy and OT ordered on POD1 for the patient.  Patient  also underwent hemodialysis on POD1.  Patient's PICC line was untouched during surgery.    Implants: Dupuy-Synthes 18 mm 6-hole hook plate.  She was given perioperative antibiotics:  Anti-infectives (From admission, onward)   Start     Dose/Rate Route Frequency Ordered Stop   02/16/17 1930  ceFAZolin  (ANCEF) 1 g in dextrose 5 % 50 mL IVPB     1 g 120 mL/hr over 30 Minutes Intravenous Every 6 hours 02/16/17 1928 02/17/17 1329   02/16/17 1115  ceFAZolin (ANCEF) IVPB 2g/100 mL premix     2 g 200 mL/hr over 30 Minutes Intravenous  Once 02/16/17 1100 02/16/17 1524   02/16/17 1053  ceFAZolin (ANCEF) 2-4 GM/100ML-% IVPB    Comments:  Dewayne Hatch   : cabinet override      02/16/17 1053 02/16/17 1524    .  She was given sequential compression devices, early ambulation for DVT prophylaxis.  She benefited maximally from the hospital stay and there were no complications.    Recent vital signs:  Vitals:   02/17/17 1215 02/17/17 1230  BP: 122/73 (!) 128/54  Pulse: (!) 59 (!) 58  Resp: 15 15  Temp:    SpO2: 100% 100%   Recent laboratory studies:  Lab Results  Component Value Date   HGB 8.7 (L) 02/17/2017   HGB 8.5 (L) 02/16/2017   HGB 8.3 (L) 02/13/2017   Lab Results  Component Value Date   WBC 7.6 02/17/2017   PLT 246 02/17/2017   Lab Results  Component Value Date   INR 1.12 02/03/2017   Lab Results  Component Value Date   NA 134 (L) 02/17/2017   K 4.3 02/17/2017   CL 97 (L) 02/17/2017   CO2 23 02/17/2017   BUN 52 (H) 02/17/2017   CREATININE 3.73 (H) 02/17/2017   GLUCOSE 123 (H) 02/17/2017   Discharge Medications:   Allergies as of 02/17/2017      Reactions   Statins Other (See Comments)   Muscle weakness Muscle weakness severe   Codeine Sulfate Nausea And Vomiting   Codeine Other (See Comments)   GI UPSET   Effexor [venlafaxine] Nausea Only   Ezetimibe-simvastatin Other (See Comments)   Muscle weakness      Medication List    TAKE these medications   acetaminophen 325 MG tablet Commonly known as:  TYLENOL Take 2 tablets (650 mg total) by mouth every 4 (four) hours as needed for mild pain or moderate pain.   diphenoxylate-atropine 2.5-0.025 MG tablet Commonly known as:  LOMOTIL TAKE 1 TABLET BY MOUTH FOUR TIMES DAILY AS NEEDED FOR DIARRHEA OR LOOSE  STOOLS What changed:  See the new instructions.   feeding supplement (NEPRO CARB STEADY) Liqd Take 237 mLs by mouth 2 (two) times daily between meals.   folic acid-vitamin b complex-vitamin c-selenium-zinc 3 MG Tabs tablet Take 1 tablet by mouth daily.   levothyroxine 25 MCG tablet Commonly known as:  SYNTHROID, LEVOTHROID Take 1 tablet (25 mcg total) by mouth daily.   lidocaine 5 % Commonly known as:  LIDODERM Place 1 patch onto the skin daily. Remove & Discard patch within 12 hours or as directed by MD Start taking on:  02/18/2017   lidocaine-prilocaine cream Commonly known as:  EMLA Apply 1 application topically every Monday, Wednesday, and Friday with hemodialysis. (1100) apply to right arm dialysis access pre-dialysis wrap with saran wrap after applying.   metoCLOPramide 5 MG tablet Commonly known as:  REGLAN Take 1 tablet (5 mg total) by  mouth every 8 (eight) hours as needed (vertigo).   metoprolol succinate 25 MG 24 hr tablet Commonly known as:  TOPROL XL Take 1 tablet (25 mg total) by mouth daily.   polyethylene glycol packet Commonly known as:  MIRALAX / GLYCOLAX Take 17 g by mouth daily as needed for mild constipation.   ramipril 2.5 MG capsule Commonly known as:  ALTACE Take 1 capsule (2.5 mg total) by mouth at bedtime.   sertraline 50 MG tablet Commonly known as:  ZOLOFT TAKE 3 TABLETS(150 MG) BY MOUTH DAILY   traMADol 50 MG tablet Commonly known as:  ULTRAM Take 1-2 tablets (50-100 mg total) by mouth every 6 (six) hours as needed for severe pain. What changed:    how much to take  when to take this   Vitamin D (Ergocalciferol) 50000 units Caps capsule Commonly known as:  DRISDOL Take 50,000 Units by mouth every Monday. (0900)      Diagnostic Studies: Dg Clavicle Right  Result Date: 02/16/2017 CLINICAL DATA:  Open reduction internal fixation for clavicle fracture EXAM: RIGHT CLAVICLE - 2+ VIEWS COMPARISON:  Intraoperative study obtained earlier in  the day FINDINGS: Frontal and tilt frontal images obtained. There is screw and plate fixation for a fracture of the distal right clavicle. Alignment anatomic at the fracture site. No fracture or dislocation. Joint spaces appear unremarkable. Visualized right lung clear. There is aortic atherosclerosis. IMPRESSION: Screw and plate fixation for fracture of lateral right clavicle with alignment anatomic at the fracture site. No new fracture. No dislocation. There is aortic atherosclerosis. Aortic Atherosclerosis (ICD10-I70.0). Electronically Signed   By: Lowella Grip III M.D.   On: 02/16/2017 19:35   Dg Clavicle Right  Result Date: 02/16/2017 CLINICAL DATA:  Open reduction internal fixation for fracture EXAM: DG C-ARM 61-120 MIN; RIGHT CLAVICLE - 2+ VIEWS COMPARISON:  February 13, 2017 FLUOROSCOPY TIME:  0 minutes 17 seconds; 2 acquired images FINDINGS: Frontal and tilt frontal images show screw and plate fixation for a fracture of the distal clavicle. Alignment at fracture site is anatomic. No dislocation evident. No new fracture. Joint spaces appear unremarkable. IMPRESSION: Alignment anatomic at fracture site. Currently no new fracture or dislocation. Joint spaces unremarkable. Electronically Signed   By: Lowella Grip III M.D.   On: 02/16/2017 19:14   Dg Shoulder Right  Result Date: 02/03/2017 CLINICAL DATA:  Pain following fall EXAM: RIGHT SHOULDER - 2+ VIEW COMPARISON:  None. FINDINGS: Frontal, shallow oblique, and Y scapular images were obtained. There is a comminuted fracture of the distal clavicle with displaced fracture fragments in this area. There appears to be a degree of coracoclavicular separation without appreciable acromioclavicular separation. No frank dislocation. No other fracture evident. There is mild generalized osteoarthritic change. Bones appear osteoporotic. There is a fairly small right pleural effusion evident. IMPRESSION: Comminuted fracture of the distal right clavicle with  displacement of fracture fragments. Coracoclavicular separation noted. Mild generalized osteoarthritic change. No dislocation. Fairly small right pleural effusion. Electronically Signed   By: Lowella Grip III M.D.   On: 02/03/2017 07:54   Ct Head Wo Contrast  Result Date: 02/13/2017 CLINICAL DATA:  Pain following fall EXAM: CT HEAD WITHOUT CONTRAST CT CERVICAL SPINE WITHOUT CONTRAST TECHNIQUE: Multidetector CT imaging of the head and cervical spine was performed following the standard protocol without intravenous contrast. Multiplanar CT image reconstructions of the cervical spine were also generated. COMPARISON:  CT head and CT cervical spine February 03, 2017 FINDINGS: CT HEAD FINDINGS Brain: Mild to moderate diffuse  atrophy is stable. There is no intracranial mass, hemorrhage, extra-axial fluid collection, or midline shift. There is patchy small vessel disease throughout the centra semiovale bilaterally. Small vessel disease is noted in each external capsule anteriorly. No acute infarct evident. Vascular: No hyperdense vessel. There is calcification in each carotid siphon and distal vertebral artery region. Skull: Bony calvarium appears intact. There is a left parietal scalp hematoma with acute hemorrhage within this area measuring 2.4 x 1.6 cm. Sinuses/Orbits: There is mucosal thickening in several ethmoid air cells bilaterally. Other visualized paranasal sinuses are clear. Orbits appear symmetric bilaterally. Other: Mastoid air cells are clear. CT CERVICAL SPINE FINDINGS Alignment: There is 1 mm of anterolisthesis of C3 on C4. There is 2 mm of anterolisthesis of C4 on C5. There is 1 mm retrolisthesis of C6 on C7, stable. Skull base and vertebrae: Skull base and craniocervical junction regions appear normal. No evident fracture. No blastic or lytic bone lesions. Soft tissues and spinal canal: Prevertebral soft tissues and predental space regions are normal. No paraspinous lesions. No cord or canal  hematoma evident. Disc levels: There is moderately severe disc space narrowing at C5-6 and C6-7 with moderate disc space narrowing at C4-5. There is multilevel facet hypertrophy. There is asymmetric disc protrusion at C6-7 on the left with exit foraminal narrowing on the left due to this disc protrusion. No high-grade stenosis. Upper chest: Visualized upper lung regions are clear. Other: There is carotid artery calcification bilaterally. IMPRESSION: CT head: Atrophy with supratentorial small vessel disease, stable. No acute infarct. No mass or hemorrhage. There are foci of arterial vascular calcification. There is a left parietal scalp hematoma. There is mucosal thickening in several ethmoid air cells. CT cervical spine: No fracture. Areas of slight spondylolisthesis are stable and felt to be due to underlying spondylosis. There is multilevel osteoarthritic change. There is carotid artery calcification bilaterally. Electronically Signed   By: Lowella Grip III M.D.   On: 02/13/2017 13:01   Ct Head Wo Contrast  Result Date: 02/03/2017 CLINICAL DATA:  Patient fell out of bed. No report of loss of consciousness. Neck pain. RIGHT forehead hematoma. LEFT forehead hematoma reportedly old. EXAM: CT HEAD WITHOUT CONTRAST CT CERVICAL SPINE WITHOUT CONTRAST TECHNIQUE: Multidetector CT imaging of the head and cervical spine was performed following the standard protocol without intravenous contrast. Multiplanar CT image reconstructions of the cervical spine were also generated. COMPARISON:  CT head 01/01/2017. CT head and cervical spine 10/11/2016. FINDINGS: CT HEAD FINDINGS Brain: No evidence of acute infarction, hemorrhage, hydrocephalus, extra-axial collection or mass lesion/mass effect. Moderate atrophy, not unexpected for age. Hypoattenuation of white matter likely small vessel disease. Vascular: Calcification of the cavernous internal carotid arteries consistent with cerebrovascular atherosclerotic disease. No  signs of intracranial large vessel occlusion. Skull: Calvarium intact.  BILATERAL frontal scalp hematomas. Sinuses/Orbits: No layering sinus fluid. BILATERAL cataract extraction. Other: No mastoid fluid. CT CERVICAL SPINE FINDINGS Alignment: There is straightening, and slight reversal, of the normal cervical lordosis. 2 mm anterolisthesis C4-5, 1 mm anterolisthesis C3-4, and 1 mm retrolisthesis C6-7 all facet mediated. Skull base and vertebrae: No acute fracture. No primary bone lesion or focal pathologic process. Soft tissues and spinal canal: No prevertebral fluid or swelling. No visible canal hematoma. Carotid atherosclerosis. Disc levels: Multilevel spondylosis, most pronounced at C5-6 and C6-7. BILATERAL cervical facet arthropathy. Upper chest: Large RIGHT pleural effusion. This appears similar to slightly smaller compared CT cervical spine from September 2018. Other: None. IMPRESSION: Atrophy and small vessel disease similar to  priors. No skull fracture or intracranial hemorrhage. BILATERAL scalp hematomas, which could be related to the acute fall. Cervical spondylosis. No cervical spine fracture or traumatic subluxation. RIGHT pleural effusion, which appears chronic based on review of another cervical spine MRI from last year. This effusion however is incompletely evaluated. A chest radiograph is warranted for further evaluation. Electronically Signed   By: Staci Righter M.D.   On: 02/03/2017 07:30   Ct Cervical Spine Wo Contrast  Result Date: 02/13/2017 CLINICAL DATA:  Pain following fall EXAM: CT HEAD WITHOUT CONTRAST CT CERVICAL SPINE WITHOUT CONTRAST TECHNIQUE: Multidetector CT imaging of the head and cervical spine was performed following the standard protocol without intravenous contrast. Multiplanar CT image reconstructions of the cervical spine were also generated. COMPARISON:  CT head and CT cervical spine February 03, 2017 FINDINGS: CT HEAD FINDINGS Brain: Mild to moderate diffuse atrophy is  stable. There is no intracranial mass, hemorrhage, extra-axial fluid collection, or midline shift. There is patchy small vessel disease throughout the centra semiovale bilaterally. Small vessel disease is noted in each external capsule anteriorly. No acute infarct evident. Vascular: No hyperdense vessel. There is calcification in each carotid siphon and distal vertebral artery region. Skull: Bony calvarium appears intact. There is a left parietal scalp hematoma with acute hemorrhage within this area measuring 2.4 x 1.6 cm. Sinuses/Orbits: There is mucosal thickening in several ethmoid air cells bilaterally. Other visualized paranasal sinuses are clear. Orbits appear symmetric bilaterally. Other: Mastoid air cells are clear. CT CERVICAL SPINE FINDINGS Alignment: There is 1 mm of anterolisthesis of C3 on C4. There is 2 mm of anterolisthesis of C4 on C5. There is 1 mm retrolisthesis of C6 on C7, stable. Skull base and vertebrae: Skull base and craniocervical junction regions appear normal. No evident fracture. No blastic or lytic bone lesions. Soft tissues and spinal canal: Prevertebral soft tissues and predental space regions are normal. No paraspinous lesions. No cord or canal hematoma evident. Disc levels: There is moderately severe disc space narrowing at C5-6 and C6-7 with moderate disc space narrowing at C4-5. There is multilevel facet hypertrophy. There is asymmetric disc protrusion at C6-7 on the left with exit foraminal narrowing on the left due to this disc protrusion. No high-grade stenosis. Upper chest: Visualized upper lung regions are clear. Other: There is carotid artery calcification bilaterally. IMPRESSION: CT head: Atrophy with supratentorial small vessel disease, stable. No acute infarct. No mass or hemorrhage. There are foci of arterial vascular calcification. There is a left parietal scalp hematoma. There is mucosal thickening in several ethmoid air cells. CT cervical spine: No fracture. Areas of  slight spondylolisthesis are stable and felt to be due to underlying spondylosis. There is multilevel osteoarthritic change. There is carotid artery calcification bilaterally. Electronically Signed   By: Lowella Grip III M.D.   On: 02/13/2017 13:01   Ct Cervical Spine Wo Contrast  Result Date: 02/03/2017 CLINICAL DATA:  Patient fell out of bed. No report of loss of consciousness. Neck pain. RIGHT forehead hematoma. LEFT forehead hematoma reportedly old. EXAM: CT HEAD WITHOUT CONTRAST CT CERVICAL SPINE WITHOUT CONTRAST TECHNIQUE: Multidetector CT imaging of the head and cervical spine was performed following the standard protocol without intravenous contrast. Multiplanar CT image reconstructions of the cervical spine were also generated. COMPARISON:  CT head 01/01/2017. CT head and cervical spine 10/11/2016. FINDINGS: CT HEAD FINDINGS Brain: No evidence of acute infarction, hemorrhage, hydrocephalus, extra-axial collection or mass lesion/mass effect. Moderate atrophy, not unexpected for age. Hypoattenuation of white matter  likely small vessel disease. Vascular: Calcification of the cavernous internal carotid arteries consistent with cerebrovascular atherosclerotic disease. No signs of intracranial large vessel occlusion. Skull: Calvarium intact.  BILATERAL frontal scalp hematomas. Sinuses/Orbits: No layering sinus fluid. BILATERAL cataract extraction. Other: No mastoid fluid. CT CERVICAL SPINE FINDINGS Alignment: There is straightening, and slight reversal, of the normal cervical lordosis. 2 mm anterolisthesis C4-5, 1 mm anterolisthesis C3-4, and 1 mm retrolisthesis C6-7 all facet mediated. Skull base and vertebrae: No acute fracture. No primary bone lesion or focal pathologic process. Soft tissues and spinal canal: No prevertebral fluid or swelling. No visible canal hematoma. Carotid atherosclerosis. Disc levels: Multilevel spondylosis, most pronounced at C5-6 and C6-7. BILATERAL cervical facet arthropathy.  Upper chest: Large RIGHT pleural effusion. This appears similar to slightly smaller compared CT cervical spine from September 2018. Other: None. IMPRESSION: Atrophy and small vessel disease similar to priors. No skull fracture or intracranial hemorrhage. BILATERAL scalp hematomas, which could be related to the acute fall. Cervical spondylosis. No cervical spine fracture or traumatic subluxation. RIGHT pleural effusion, which appears chronic based on review of another cervical spine MRI from last year. This effusion however is incompletely evaluated. A chest radiograph is warranted for further evaluation. Electronically Signed   By: Staci Righter M.D.   On: 02/03/2017 07:30   Dg Chest Portable 1 View  Result Date: 02/13/2017 CLINICAL DATA:  CHF, cardiomyopathy, pleural effusion EXAM: PORTABLE CHEST 1 VIEW COMPARISON:  01/04/2017 FINDINGS: Marked cardiomegaly without current CHF or edema. No large pleural effusion. No pneumothorax. Left IJ dialysis catheter tips SVC RA junction. Left subclavian 2 lead pacer noted. Aorta is atherosclerotic. Bones are osteopenic. IMPRESSION: Cardiomegaly without evidence of CHF or pneumonia. No significant pleural effusion by plain radiography. Electronically Signed   By: Jerilynn Mages.  Shick M.D.   On: 02/13/2017 13:39   Dg Humerus Right  Result Date: 02/13/2017 CLINICAL DATA:  Pain following fall EXAM: RIGHT HUMERUS - 2+ VIEW COMPARISON:  Right shoulder February 03, 2017 FINDINGS: Frontal and lateral views were obtained. There is again noted a comminuted fracture of the lateral right clavicle with inferior and medial displacement of the lateral right clavicle compared to the remainder of the clavicle. Alignment in this area is similar compared to recent prior study. No new fracture evident. No dislocation. Bones are osteoporotic. Joint spaces appear unremarkable. IMPRESSION: Comminuted fracture lateral right clavicle with displacement of fracture fragments, similar to recent study of the  shoulder. No new fracture. No dislocation. No appreciable arthropathy. Electronically Signed   By: Lowella Grip III M.D.   On: 02/13/2017 13:11   Dg C-arm 1-60 Min  Result Date: 02/16/2017 CLINICAL DATA:  Open reduction internal fixation for fracture EXAM: DG C-ARM 61-120 MIN; RIGHT CLAVICLE - 2+ VIEWS COMPARISON:  February 13, 2017 FLUOROSCOPY TIME:  0 minutes 17 seconds; 2 acquired images FINDINGS: Frontal and tilt frontal images show screw and plate fixation for a fracture of the distal clavicle. Alignment at fracture site is anatomic. No dislocation evident. No new fracture. Joint spaces appear unremarkable. IMPRESSION: Alignment anatomic at fracture site. Currently no new fracture or dislocation. Joint spaces unremarkable. Electronically Signed   By: Lowella Grip III M.D.   On: 02/16/2017 19:14   Disposition: Plan will be for discharge this afternoon pending hemodialysis in the afternoon.  Follow-up Information    Lattie Corns, PA-C. Call in 14 day(s).   Specialty:  Physician Assistant Why:  Electa Sniff information: [address] [place] [address]  [phone]          Signed: Judson Roch PA-C 02/17/2017, 1:14 PM

## 2017-02-18 ENCOUNTER — Encounter: Payer: Self-pay | Admitting: Surgery

## 2017-02-25 ENCOUNTER — Ambulatory Visit: Payer: Medicare Other | Attending: Family | Admitting: Family

## 2017-02-25 ENCOUNTER — Encounter: Payer: Self-pay | Admitting: Family

## 2017-02-25 VITALS — BP 132/43 | HR 60 | Resp 18 | Ht 64.0 in | Wt 99.4 lb

## 2017-02-25 DIAGNOSIS — Z992 Dependence on renal dialysis: Secondary | ICD-10-CM | POA: Insufficient documentation

## 2017-02-25 DIAGNOSIS — Z818 Family history of other mental and behavioral disorders: Secondary | ICD-10-CM | POA: Diagnosis not present

## 2017-02-25 DIAGNOSIS — Z87891 Personal history of nicotine dependence: Secondary | ICD-10-CM | POA: Insufficient documentation

## 2017-02-25 DIAGNOSIS — Z90710 Acquired absence of both cervix and uterus: Secondary | ICD-10-CM | POA: Diagnosis not present

## 2017-02-25 DIAGNOSIS — Z801 Family history of malignant neoplasm of trachea, bronchus and lung: Secondary | ICD-10-CM | POA: Diagnosis not present

## 2017-02-25 DIAGNOSIS — Z96652 Presence of left artificial knee joint: Secondary | ICD-10-CM | POA: Diagnosis not present

## 2017-02-25 DIAGNOSIS — I132 Hypertensive heart and chronic kidney disease with heart failure and with stage 5 chronic kidney disease, or end stage renal disease: Secondary | ICD-10-CM | POA: Insufficient documentation

## 2017-02-25 DIAGNOSIS — Z808 Family history of malignant neoplasm of other organs or systems: Secondary | ICD-10-CM | POA: Insufficient documentation

## 2017-02-25 DIAGNOSIS — X58XXXD Exposure to other specified factors, subsequent encounter: Secondary | ICD-10-CM | POA: Insufficient documentation

## 2017-02-25 DIAGNOSIS — I429 Cardiomyopathy, unspecified: Secondary | ICD-10-CM | POA: Insufficient documentation

## 2017-02-25 DIAGNOSIS — F329 Major depressive disorder, single episode, unspecified: Secondary | ICD-10-CM | POA: Insufficient documentation

## 2017-02-25 DIAGNOSIS — I509 Heart failure, unspecified: Secondary | ICD-10-CM | POA: Diagnosis present

## 2017-02-25 DIAGNOSIS — Z888 Allergy status to other drugs, medicaments and biological substances status: Secondary | ICD-10-CM | POA: Diagnosis not present

## 2017-02-25 DIAGNOSIS — I2510 Atherosclerotic heart disease of native coronary artery without angina pectoris: Secondary | ICD-10-CM | POA: Diagnosis not present

## 2017-02-25 DIAGNOSIS — Z79890 Hormone replacement therapy (postmenopausal): Secondary | ICD-10-CM | POA: Insufficient documentation

## 2017-02-25 DIAGNOSIS — Z885 Allergy status to narcotic agent status: Secondary | ICD-10-CM | POA: Insufficient documentation

## 2017-02-25 DIAGNOSIS — G4733 Obstructive sleep apnea (adult) (pediatric): Secondary | ICD-10-CM | POA: Diagnosis not present

## 2017-02-25 DIAGNOSIS — I5022 Chronic systolic (congestive) heart failure: Secondary | ICD-10-CM | POA: Insufficient documentation

## 2017-02-25 DIAGNOSIS — K589 Irritable bowel syndrome without diarrhea: Secondary | ICD-10-CM | POA: Insufficient documentation

## 2017-02-25 DIAGNOSIS — S42001D Fracture of unspecified part of right clavicle, subsequent encounter for fracture with routine healing: Secondary | ICD-10-CM | POA: Diagnosis not present

## 2017-02-25 DIAGNOSIS — Z79899 Other long term (current) drug therapy: Secondary | ICD-10-CM | POA: Insufficient documentation

## 2017-02-25 DIAGNOSIS — I10 Essential (primary) hypertension: Secondary | ICD-10-CM

## 2017-02-25 DIAGNOSIS — I252 Old myocardial infarction: Secondary | ICD-10-CM | POA: Diagnosis not present

## 2017-02-25 DIAGNOSIS — Z825 Family history of asthma and other chronic lower respiratory diseases: Secondary | ICD-10-CM | POA: Diagnosis not present

## 2017-02-25 DIAGNOSIS — N186 End stage renal disease: Secondary | ICD-10-CM | POA: Insufficient documentation

## 2017-02-25 DIAGNOSIS — Z9049 Acquired absence of other specified parts of digestive tract: Secondary | ICD-10-CM | POA: Diagnosis not present

## 2017-02-25 DIAGNOSIS — Z950 Presence of cardiac pacemaker: Secondary | ICD-10-CM | POA: Diagnosis not present

## 2017-02-25 DIAGNOSIS — S42034S Nondisplaced fracture of lateral end of right clavicle, sequela: Secondary | ICD-10-CM

## 2017-02-25 DIAGNOSIS — Z833 Family history of diabetes mellitus: Secondary | ICD-10-CM | POA: Insufficient documentation

## 2017-02-25 NOTE — Progress Notes (Signed)
Patient ID: Sharon [name], female    DOB: July 01, 1926, 84 y.o.   MRN: [id number]  HPI  Sharon [name] is a 84 y/o female with a history of CAD, CKD (dialysis), MI, obstructive sleep apnea, depression, anemia, remote tobacco use and chronic heart failure.   Echo report from 01/02/17 reviewed and showed an EF of 20-25% along with moderate MR, mod/severe TR and severely elevated PA pressure of 77 mm Hg.   Admitted 02/16/17 due to right clavicle fracture requiring surgery. Surgery done and she was discharged the following day. Was in the ED 02/13/17 after falling out of a chair. Xray showed right displaced lateral clavicle fracture. Discharged to SNF until surgery. Admitted 02/03/17 due to right clavicle fracture after a fall. Palliative care consult obtained. Not a surgical candidate and she was discharged the next day with a shoulder sling. Admitted 01/01/17 due to right lower lobe PE with syncope. IVC filter placed. Tried on heparin drip and then with apixaban and patient had bleeding from skin tears and her dialysis access. Anticoagulation was stopped. Cardiology consult was obtained. Thoracentesis was done due to pleural effusion. Palliative care consult also obtained. Was discharged to facility after 7 days. Was in the ED 10/11/16 after a fall. Had to have a laceration on her forehead sutured and she was released that day.   She presents today for a follow-up visit with a chief complaint of moderate fatigue with minimal exertion. This has been present for several years and she doesn't think that it's any worse. She has associated cough, shortness of breath, chronic chest pain, difficulty sleeping, depression and right clavicle pain along with this. She denies any edema, palpitations, dizziness, abdominal distention or weight gain.   Past Medical History:  Diagnosis Date  . Anemia   . Arthritis   . CAD (coronary artery disease)   . Cancer ([place])    skin  . Cardiomyopathy ([place])   . CHF (congestive heart  failure) ([place])   . Depression   . IBS (irritable bowel syndrome) 2010  . Kidney failure July 2012   Hemodialysis 3xweek  . Kidney failure   . Meniere disease   . Meniere's disease   . Myocardial infarction ([place])   . OSA (obstructive sleep apnea)    CPAP  . Peritoneal dialysis status ([place])   . Presence of IVC filter 2019  . Presence of permanent cardiac pacemaker   . Renal insufficiency    Past Surgical History:  Procedure Laterality Date  . [place]  . ABDOMINAL HYSTERECTOMY    . ANUS SURGERY  2010  . AV FISTULA PLACEMENT Right 04/15/2016   Procedure: ARTERIOVENOUS (AV) FISTULA CREATION ( RADIOCEPHALIC ) STAGE 2;  Surgeon: Algernon Huxley, MD;  Location: [facility];  Service: Vascular;  Laterality: Right;  . CATARACT EXTRACTION  2006, 2011  . CHOLECYSTECTOMY  01/2008  . DIALYSIS/PERMA CATHETER INSERTION N/A 01/07/2017   Procedure: DIALYSIS/PERMA CATHETER INSERTION With an IVC filter placement;  Surgeon: Algernon Huxley, MD;  Location: [place] [facility];  Service: Cardiovascular;  Laterality: N/A;  . DIALYSIS/PERMA CATHETER REMOVAL N/A 08/20/2016   Procedure: Dialysis/Perma Catheter Removal;  Surgeon: Algernon Huxley, MD;  Location: [place] [facility];  Service: Cardiovascular;  Laterality: N/A;  . EYE SURGERY     cataracts bilateral  . FEMUR IM NAIL Left 12/15/2015   Procedure: INTRAMEDULLARY (IM) RETROGRADE FEMORAL NAILING;  Surgeon: Oletta Cohn, DO;  Location: [facility];  Service: Orthopedics;  Laterality: Left;  .  HIP PINNING,CANNULATED Left 05/10/2016   Procedure: CANNULATED HIP PINNING;  Surgeon: Earnestine Leys, MD;  Location: [facility];  Service: Orthopedics;  Laterality: Left;  . INSERT / REPLACE / REMOVE PACEMAKER    . INSERTION OF DIALYSIS CATHETER  07/2010  . IVC FILTER INSERTION  2019  . JOINT REPLACEMENT     left knee  . MINOR REMOVAL OF PERITONEAL DIALYSIS CATHETER  04/15/2016   Procedure: MINOR REMOVAL OF PERITONEAL DIALYSIS CATHETER;  Surgeon: Algernon Huxley, MD;  Location: [facility];  Service: Vascular;;  . ORIF CLAVICULAR FRACTURE Right 02/16/2017   Procedure: OPEN REDUCTION INTERNAL FIXATION (ORIF) CLAVICULAR FRACTURE;  Surgeon: Corky Mull, MD;  Location: [facility];  Service: Orthopedics;  Laterality: Right;  . PACEMAKER INSERTION  2006  . PERIPHERAL VASCULAR CATHETERIZATION N/A 12/18/2015   Procedure: Dialysis/Perma Catheter Insertion;  Surgeon: Katha Cabal, MD;  Location: [place] [facility];  Service: Cardiovascular;  Laterality: N/A;  . TOTAL KNEE ARTHROPLASTY  2008   LEFT/Dr Calif   Family History  Problem Relation Age of Onset  . Cancer Mother        ? ovarian - sarcoma  . Cancer Father        Skin cancer  . Diabetes Sister   . COPD Sister   . Depression Sister   . Cancer Sister        Lung - 25 yrs old   Social History   Tobacco Use  . Smoking status: Former Smoker    Last attempt to quit: 03/31/1948    Years since quitting: 68.9  . Smokeless tobacco: Never Used  Substance Use Topics  . Alcohol use: No   Allergies  Allergen Reactions  . Statins Other (See Comments)    Muscle weakness Muscle weakness severe  . Codeine Sulfate Nausea And Vomiting  . Codeine Other (See Comments)    GI UPSET  . Effexor [Venlafaxine] Nausea Only  . Ezetimibe-Simvastatin Other (See Comments)    Muscle weakness   Prior to Admission medications   Medication Sig Start Date End Date Taking? Authorizing Provider  acetaminophen (TYLENOL) 325 MG tablet Take 2 tablets (650 mg total) by mouth every 4 (four) hours as needed for mild pain or moderate pain. 05/14/16  Yes Wieting, Richard, MD  diphenoxylate-atropine (LOMOTIL) 2.5-0.025 MG tablet TAKE 1 TABLET BY MOUTH FOUR TIMES DAILY AS NEEDED FOR DIARRHEA OR LOOSE STOOLS Patient taking differently: TAKE 1 TABLET BY MOUTH every 6 hours AS NEEDED FOR DIARRHEA OR LOOSE STOOLS 08/30/15  Yes [name], MD  folic acid-vitamin b complex-vitamin c-selenium-zinc (DIALYVITE) 3 MG TABS tablet Take  1 tablet by mouth daily.   Yes [provider]  levothyroxine (SYNTHROID, LEVOTHROID) 25 MCG tablet Take 1 tablet (25 mcg total) by mouth daily. 02/04/17  Yes Mody, Sital, MD  lidocaine (LIDODERM) 5 % Place 1 patch onto the skin daily. Remove & Discard patch within 12 hours or as directed by MD 02/18/17  Yes Lattie Corns, PA-C  metoCLOPramide (REGLAN) 5 MG tablet Take 1 tablet (5 mg total) by mouth every 8 (eight) hours as needed (vertigo). 05/30/16  Yes Menshew, Dannielle Karvonen, PA-C  metoprolol succinate (TOPROL XL) 25 MG 24 hr tablet Take 1 tablet (25 mg total) by mouth daily. 01/08/17  Yes Wieting, Richard, MD  Nutritional Supplements (FEEDING SUPPLEMENT, NEPRO CARB STEADY,) LIQD Take 237 mLs by mouth 2 (two) times daily between meals. 01/08/17  Yes Loletha Grayer, MD  polyethylene glycol [facility] / Floria Raveling) packet  Take 17 g by mouth daily as needed for mild constipation. 01/08/17  Yes Wieting, Richard, MD  ramipril (ALTACE) 2.5 MG capsule Take 1 capsule (2.5 mg total) by mouth at bedtime. 01/08/17  Yes Wieting, Richard, MD  sertraline (ZOLOFT) 50 MG tablet TAKE 3 TABLETS(150 MG) BY MOUTH DAILY Patient taking differently: 100 mg daily 08/30/15  Yes [name], MD  Vitamin D, Ergocalciferol, (DRISDOL) 50000 units CAPS capsule Take 50,000 Units by mouth every Monday. (0900) 04/17/15  Yes [provider]  traMADol (ULTRAM) 50 MG tablet Take 1-2 tablets (50-100 mg total) by mouth every 6 (six) hours as needed for severe pain. Patient not taking: Reported on 02/25/2017 02/17/17   Lattie Corns, PA-C   Review of Systems  Constitutional: Positive for fatigue. Negative for appetite change.  HENT: Positive for hearing loss. Negative for congestion and sore throat.   Eyes: Negative.   Respiratory: Positive for cough and shortness of breath. Negative for chest tightness.   Cardiovascular: Positive for chest pain (chronic). Negative for palpitations and leg swelling.   Gastrointestinal: Negative for abdominal distention and abdominal pain.  Endocrine: Negative.   Musculoskeletal: Positive for arthralgias (right arm) and back pain (at times).  Skin: Negative.   Allergic/Immunologic: Negative.   Neurological: Negative for dizziness and light-headedness.  Hematological: Negative for adenopathy. Bruises/bleeds easily.  Psychiatric/Behavioral: Positive for dysphoric mood and sleep disturbance (sleeping "all the time"). The patient is not nervous/anxious.    Vitals:   02/25/17 1056  BP: (!) 132/43  Pulse: 60  Resp: 18  SpO2: 100%  Weight: 99 lb 6 oz (45.1 kg)  Height: 5\' 4"  (1.626 m)   Wt Readings from Last 3 Encounters:  02/25/17 99 lb 6 oz (45.1 kg)  02/16/17 98 lb (44.5 kg)  02/13/17 98 lb (44.5 kg)   Lab Results  Component Value Date   CREATININE 3.73 (H) 02/17/2017   CREATININE 2.57 (H) 02/13/2017   CREATININE 3.70 (H) 02/03/2017    Physical Exam  Constitutional: She is oriented to person, place, and time. She appears well-developed and well-nourished.  HENT:  Head: Normocephalic and atraumatic.  Neck: Normal range of motion. Neck supple. No JVD present.  Cardiovascular: Normal rate and regular rhythm.  Pulmonary/Chest: Effort normal. No respiratory distress. She has no wheezes. She has no rales.  Abdominal: Soft. She exhibits no distension. There is no tenderness.  Musculoskeletal: She exhibits no edema or tenderness.  Neurological: She is alert and oriented to person, place, and time.  Skin: Skin is warm and dry.  Psychiatric: She has a normal mood and affect. Her behavior is normal. Thought content normal.  Nursing note and vitals reviewed.  Assessment & Plan:  1: Chronic heart failure with reduced ejection fraction- - NYHA class III - euvolemic today - being weighed at dialysis; she says that she's not getting weighed daily. Brother is going to follow-up and he was reminded to have the staff call for an overnight weight gain of  >2 pounds or a weekly weight gain of >5 pounds - adds salt to grits and she was encouraged to not add any salt to any food - unsure of fluid intake and discussed the importance of keeping fluid intake to between 40-60 ounces daily - saw cardiologist Nehemiah Massed) 02/02/17 - has pacemaker present and it was evaluated on 02/02/17  2: HTN- - BP looks good today - BMP from 02/17/17 reviewed and showed sodium 134, potassium 4.3 and GFR 10 - seeing PCP at the facility  3: Right clavicle fracture- - had surgery and has her right arm currently in a sling - returning to orthopaedics next week  4: ESRD-  - receiving dialysis on M, W, F - gets weighed at dialysis  Facility medication list was reviewed.  Patient and brother opt to not make a return appointment at this time. Advised him that he could call back at anytime to make another appointment.

## 2017-02-25 NOTE — Patient Instructions (Signed)
Continue weighing daily and call for an overnight weight gain of > 2 pounds or a weekly weight gain of >5 pounds. 

## 2017-03-08 ENCOUNTER — Other Ambulatory Visit: Payer: Self-pay | Admitting: Family

## 2017-03-26 ENCOUNTER — Encounter (INDEPENDENT_AMBULATORY_CARE_PROVIDER_SITE_OTHER): Payer: Self-pay

## 2017-03-29 ENCOUNTER — Encounter (INDEPENDENT_AMBULATORY_CARE_PROVIDER_SITE_OTHER): Payer: Medicare Other

## 2017-03-29 ENCOUNTER — Ambulatory Visit (INDEPENDENT_AMBULATORY_CARE_PROVIDER_SITE_OTHER): Payer: Medicare Other | Admitting: Vascular Surgery

## 2017-03-29 ENCOUNTER — Other Ambulatory Visit (INDEPENDENT_AMBULATORY_CARE_PROVIDER_SITE_OTHER): Payer: Self-pay | Admitting: Vascular Surgery

## 2017-03-30 ENCOUNTER — Encounter
Admission: RE | Disposition: A | Discharge status: Skilled Nursing Facility | Payer: Self-pay | Source: Ambulatory Visit | Attending: Vascular Surgery

## 2017-03-30 ENCOUNTER — Ambulatory Visit
Admission: RE | Admit: 2017-03-30 | Discharge: 2017-03-30 | Disposition: A | Discharge status: Skilled Nursing Facility | Payer: Medicare Other | Source: Ambulatory Visit | Attending: Vascular Surgery | Admitting: Vascular Surgery

## 2017-03-30 DIAGNOSIS — Z539 Procedure and treatment not carried out, unspecified reason: Secondary | ICD-10-CM | POA: Insufficient documentation

## 2017-03-30 DIAGNOSIS — N186 End stage renal disease: Secondary | ICD-10-CM | POA: Diagnosis not present

## 2017-03-30 SURGERY — DIALYSIS/PERMA CATHETER REMOVAL
Anesthesia: LOCAL

## 2017-03-30 NOTE — Progress Notes (Signed)
Patient came for perm cath removal. Stating that she is having "some difficulties" with her fistula access at dialysis, positive for bruit and thrill. MD at bedside to talk with patient. Cancel procedure for today, made appt with Dr Delana Meyer and HDA of access Thursday. Reviewed appt with patient. Discharge via transportation

## 2017-04-01 ENCOUNTER — Other Ambulatory Visit (INDEPENDENT_AMBULATORY_CARE_PROVIDER_SITE_OTHER): Payer: Self-pay | Admitting: Vascular Surgery

## 2017-04-01 ENCOUNTER — Encounter (INDEPENDENT_AMBULATORY_CARE_PROVIDER_SITE_OTHER): Payer: Self-pay | Admitting: Vascular Surgery

## 2017-04-01 ENCOUNTER — Ambulatory Visit (INDEPENDENT_AMBULATORY_CARE_PROVIDER_SITE_OTHER): Payer: Medicare Other

## 2017-04-01 ENCOUNTER — Ambulatory Visit (INDEPENDENT_AMBULATORY_CARE_PROVIDER_SITE_OTHER): Payer: Medicare Other | Admitting: Vascular Surgery

## 2017-04-01 VITALS — BP 162/73 | HR 72 | Resp 16 | Wt 103.0 lb

## 2017-04-01 DIAGNOSIS — I10 Essential (primary) hypertension: Secondary | ICD-10-CM

## 2017-04-01 DIAGNOSIS — Z992 Dependence on renal dialysis: Principal | ICD-10-CM

## 2017-04-01 DIAGNOSIS — I2782 Chronic pulmonary embolism: Secondary | ICD-10-CM

## 2017-04-01 DIAGNOSIS — N186 End stage renal disease: Secondary | ICD-10-CM | POA: Diagnosis not present

## 2017-04-01 DIAGNOSIS — T829XXS Unspecified complication of cardiac and vascular prosthetic device, implant and graft, sequela: Secondary | ICD-10-CM

## 2017-04-01 DIAGNOSIS — T829XXA Unspecified complication of cardiac and vascular prosthetic device, implant and graft, initial encounter: Secondary | ICD-10-CM | POA: Insufficient documentation

## 2017-04-01 NOTE — Progress Notes (Signed)
MRN : [id number]  Sharon VANHECKE is a 84 y.o. ([birth year]) female who presents with chief complaint of No chief complaint on file. Marland Kitchen  History of Present Illness:  The patient returns to the office for followup status post intervention of the dialysis access.   PROCEDURE 04/15/2016: Right radiocephaic arteriovenous fistula placement  This access was complicated by a hematoma associated with anticoagulation for a new PE in 12/2016.  Following the intervention the access function has significantly improved, with better flow rates and improved KT/V. The patient has not been experiencing increased bleeding times following decannulation and the patient denies increased recirculation. The patient denies an increase in arm swelling. At the present time the patient denies hand pain.  The patient denies amaurosis fugax or recent TIA symptoms. There are no recent neurological changes noted. The patient denies claudication symptoms or rest pain symptoms. The patient denies history of DVT, PE or superficial thrombophlebitis. The patient denies recent episodes of angina or shortness of breath.       No outpatient medications have been marked as taking for the 04/01/17 encounter (Appointment) with Delana Meyer, Dolores Lory, MD.    Past Medical History:  Diagnosis Date  . Anemia   . Arthritis   . CAD (coronary artery disease)   . Cancer ([place])    skin  . Cardiomyopathy ([place])   . CHF (congestive heart failure) ([place])   . Depression   . IBS (irritable bowel syndrome) 2010  . Kidney failure July 2012   Hemodialysis 3xweek  . Kidney failure   . Meniere disease   . Meniere's disease   . Myocardial infarction ([place])   . OSA (obstructive sleep apnea)    CPAP  . Peritoneal dialysis status ([place])   . Presence of IVC filter 2019  . Presence of permanent cardiac pacemaker   . Renal insufficiency     Past Surgical History:  Procedure Laterality Date  . [place]  . ABDOMINAL  HYSTERECTOMY    . ANUS SURGERY  2010  . AV FISTULA PLACEMENT Right 04/15/2016   Procedure: ARTERIOVENOUS (AV) FISTULA CREATION ( RADIOCEPHALIC ) STAGE 2;  Surgeon: Algernon Huxley, MD;  Location: [facility];  Service: Vascular;  Laterality: Right;  . CATARACT EXTRACTION  2006, 2011  . CHOLECYSTECTOMY  01/2008  . DIALYSIS/PERMA CATHETER INSERTION N/A 01/07/2017   Procedure: DIALYSIS/PERMA CATHETER INSERTION With an IVC filter placement;  Surgeon: Algernon Huxley, MD;  Location: [place] [facility];  Service: Cardiovascular;  Laterality: N/A;  . DIALYSIS/PERMA CATHETER REMOVAL N/A 08/20/2016   Procedure: Dialysis/Perma Catheter Removal;  Surgeon: Algernon Huxley, MD;  Location: [place] [facility];  Service: Cardiovascular;  Laterality: N/A;  . EYE SURGERY     cataracts bilateral  . FEMUR IM NAIL Left 12/15/2015   Procedure: INTRAMEDULLARY (IM) RETROGRADE FEMORAL NAILING;  Surgeon: Oletta Cohn, DO;  Location: [facility];  Service: Orthopedics;  Laterality: Left;  . HIP PINNING,CANNULATED Left 05/10/2016   Procedure: CANNULATED HIP PINNING;  Surgeon: Earnestine Leys, MD;  Location: [facility];  Service: Orthopedics;  Laterality: Left;  . INSERT / REPLACE / REMOVE PACEMAKER    . INSERTION OF DIALYSIS CATHETER  07/2010  . IVC FILTER INSERTION  2019  . JOINT REPLACEMENT     left knee  . MINOR REMOVAL OF PERITONEAL DIALYSIS CATHETER  04/15/2016   Procedure: MINOR REMOVAL OF PERITONEAL DIALYSIS CATHETER;  Surgeon: Algernon Huxley, MD;  Location: [facility];  Service: Vascular;;  .  ORIF CLAVICULAR FRACTURE Right 02/16/2017   Procedure: OPEN REDUCTION INTERNAL FIXATION (ORIF) CLAVICULAR FRACTURE;  Surgeon: Corky Mull, MD;  Location: [facility];  Service: Orthopedics;  Laterality: Right;  . PACEMAKER INSERTION  2006  . PERIPHERAL VASCULAR CATHETERIZATION N/A 12/18/2015   Procedure: Dialysis/Perma Catheter Insertion;  Surgeon: Katha Cabal, MD;  Location: [place] [facility];  Service: Cardiovascular;  Laterality: N/A;    . TOTAL KNEE ARTHROPLASTY  2008   LEFT/Dr Calif    Social History Social History   Tobacco Use  . Smoking status: Former Smoker    Last attempt to quit: 03/31/1948    Years since quitting: 69.0  . Smokeless tobacco: Never Used  Substance Use Topics  . Alcohol use: No  . Drug use: No    Family History Family History  Problem Relation Age of Onset  . Cancer Mother        ? ovarian - sarcoma  . Cancer Father        Skin cancer  . Diabetes Sister   . COPD Sister   . Depression Sister   . Cancer Sister        Lung - 82 yrs old    Allergies  Allergen Reactions  . Statins Other (See Comments)    Muscle weakness severe  . Codeine Sulfate Nausea And Vomiting  . Codeine Other (See Comments)    GI UPSET  . Effexor [Venlafaxine] Nausea Only  . Ezetimibe-Simvastatin Other (See Comments)    Muscle weakness     REVIEW OF SYSTEMS (Negative unless checked)  Constitutional: [] Weight loss  [] Fever  [] Chills Cardiac: [] Chest pain   [] Chest pressure   [] Palpitations   [x] Shortness of breath when laying flat   [x] Shortness of breath with exertion. Vascular:  [] Pain in legs with walking   [] Pain in legs at rest  [] History of DVT   [] Phlebitis   [] Swelling in legs   [] Varicose veins   [] Non-healing ulcers Pulmonary:   [] Uses home oxygen   [] Productive cough   [] Hemoptysis   [] Wheeze  [] COPD   [] Asthma Neurologic:  [] Dizziness   [] Seizures   [] History of stroke   [] History of TIA  [] Aphasia   [] Vissual changes   [] Weakness or numbness in arm   [] Weakness or numbness in leg Musculoskeletal:   [] Joint swelling   [] Joint pain   [] Low back pain Hematologic:  [] Easy bruising  [] Easy bleeding   [] Hypercoagulable state   [] Anemic Gastrointestinal:  [] Diarrhea   [] Vomiting  [] Gastroesophageal reflux/heartburn   [] Difficulty swallowing. Genitourinary:  [x] Chronic kidney disease   [] Difficult urination  [] Frequent urination   [] Blood in urine Skin:  [] Rashes   [] Ulcers  Psychological:  [] History  of anxiety   []  History of major depression.  Physical Examination  There were no vitals filed for this visit. There is no height or weight on file to calculate BMI. Gen: WD/WN, NAD Head: [place]/AT, No temporalis wasting.  Ear/Nose/Throat: Hearing grossly intact, nares w/o erythema or drainage Eyes: PER, EOMI, sclera nonicteric.  Neck: Supple, no large masses.   Pulmonary:  Good air movement, no audible wheezing bilaterally, no use of accessory muscles.  Cardiac: RRR, no JVD Vascular: Right brachiocephalic fistula good thrill good bruit moderate bruising and a moderate hematoma noted Vessel Right Left  Radial Palpable Palpable  Ulnar Palpable Palpable  Brachial Palpable Palpable  Carotid Palpable Palpable  Gastrointestinal: Non-distended. No guarding/no peritoneal signs.  Musculoskeletal: M/S 5/5 throughout.  No deformity or atrophy.  Neurologic: CN 2-12 intact.  Symmetrical.  Speech is fluent. Motor exam as listed above. Psychiatric: Judgment intact, Mood & affect appropriate for pt's clinical situation. Dermatologic: No rashes or ulcers noted.  No changes consistent with cellulitis. Lymph : No lichenification or skin changes of chronic lymphedema.  CBC Lab Results  Component Value Date   WBC 7.6 02/17/2017   HGB 8.7 (L) 02/17/2017   HCT 26.4 (L) 02/17/2017   MCV 99.5 02/17/2017   PLT 246 02/17/2017    BMET    Component Value Date/Time   NA 134 (L) 02/17/2017 0449   NA 142 04/24/2013 1557   K 4.3 02/17/2017 0449   K 3.8 04/24/2013 1557   CL 97 (L) 02/17/2017 0449   CL 106 04/24/2013 1557   CO2 23 02/17/2017 0449   CO2 32 04/24/2013 1557   GLUCOSE 123 (H) 02/17/2017 0449   GLUCOSE 109 (H) 04/24/2013 1557   BUN 52 (H) 02/17/2017 0449   BUN 40 (A) 09/03/2014   BUN 30 (H) 04/24/2013 1557   CREATININE 3.73 (H) 02/17/2017 0449   CREATININE 2.68 (H) 04/24/2013 1557   CALCIUM 8.8 (L) 02/17/2017 0449   CALCIUM 8.7 04/24/2013 1557   GFRNONAA 10 (L) 02/17/2017 0449    GFRNONAA 16 (L) 04/24/2013 1557   GFRAA 12 (L) 02/17/2017 0449   GFRAA 18 (L) 04/24/2013 1557   CrCl cannot be calculated (Patient's most recent lab result is older than the maximum 21 days allowed.).  COAG Lab Results  Component Value Date   INR 1.12 02/03/2017   INR 1.39 01/01/2017   INR 1.14 11/10/2016    Radiology No results found.  Assessment/Plan 1. ESRD on hemodialysis [facility]) Recommend:  The patient is doing well and currently has adequate dialysis access. The patient's dialysis center is not reporting any access issues. Flow pattern is stable when compared to the prior ultrasound.  The patient should have a duplex ultrasound of the dialysis access in 6 months.  The patient will follow-up with me in the office after each ultrasound   2. Complication from renal dialysis device, sequela See #1  3. Other chronic pulmonary embolism without acute cor pulmonale (HCC) Continue pulmonary medications and aerosols as already ordered, these medications have been reviewed and there are no changes at this time.  4. Benign essential hypertension Continue antihypertensive medications as already ordered, these medications have been reviewed and there are no changes at this time.    Hortencia Pilar, MD  04/01/2017 8:44 AM

## 2017-04-02 ENCOUNTER — Encounter (INDEPENDENT_AMBULATORY_CARE_PROVIDER_SITE_OTHER): Payer: Self-pay | Admitting: Vascular Surgery

## 2017-04-07 ENCOUNTER — Other Ambulatory Visit: Payer: Self-pay

## 2017-04-07 ENCOUNTER — Encounter: Payer: Self-pay | Admitting: *Deleted

## 2017-04-07 ENCOUNTER — Emergency Department: Payer: Medicare Other

## 2017-04-07 ENCOUNTER — Emergency Department
Admission: EM | Admit: 2017-04-07 | Discharge: 2017-04-07 | Disposition: A | Discharge status: Home / Self Care | Payer: Medicare Other | Attending: Emergency Medicine | Admitting: Emergency Medicine

## 2017-04-07 DIAGNOSIS — Y999 Unspecified external cause status: Secondary | ICD-10-CM | POA: Diagnosis not present

## 2017-04-07 DIAGNOSIS — Z79899 Other long term (current) drug therapy: Secondary | ICD-10-CM | POA: Diagnosis not present

## 2017-04-07 DIAGNOSIS — N186 End stage renal disease: Secondary | ICD-10-CM | POA: Diagnosis not present

## 2017-04-07 DIAGNOSIS — Y92129 Unspecified place in nursing home as the place of occurrence of the external cause: Secondary | ICD-10-CM | POA: Insufficient documentation

## 2017-04-07 DIAGNOSIS — Z96652 Presence of left artificial knee joint: Secondary | ICD-10-CM | POA: Diagnosis not present

## 2017-04-07 DIAGNOSIS — W050XXA Fall from non-moving wheelchair, initial encounter: Secondary | ICD-10-CM | POA: Insufficient documentation

## 2017-04-07 DIAGNOSIS — S0003XA Contusion of scalp, initial encounter: Secondary | ICD-10-CM | POA: Insufficient documentation

## 2017-04-07 DIAGNOSIS — Z992 Dependence on renal dialysis: Secondary | ICD-10-CM | POA: Insufficient documentation

## 2017-04-07 DIAGNOSIS — J90 Pleural effusion, not elsewhere classified: Secondary | ICD-10-CM | POA: Diagnosis not present

## 2017-04-07 DIAGNOSIS — I132 Hypertensive heart and chronic kidney disease with heart failure and with stage 5 chronic kidney disease, or end stage renal disease: Secondary | ICD-10-CM | POA: Diagnosis not present

## 2017-04-07 DIAGNOSIS — I5022 Chronic systolic (congestive) heart failure: Secondary | ICD-10-CM | POA: Diagnosis not present

## 2017-04-07 DIAGNOSIS — Z87891 Personal history of nicotine dependence: Secondary | ICD-10-CM | POA: Insufficient documentation

## 2017-04-07 DIAGNOSIS — I2510 Atherosclerotic heart disease of native coronary artery without angina pectoris: Secondary | ICD-10-CM | POA: Diagnosis not present

## 2017-04-07 DIAGNOSIS — Z85828 Personal history of other malignant neoplasm of skin: Secondary | ICD-10-CM | POA: Insufficient documentation

## 2017-04-07 DIAGNOSIS — I252 Old myocardial infarction: Secondary | ICD-10-CM | POA: Diagnosis not present

## 2017-04-07 DIAGNOSIS — Y939 Activity, unspecified: Secondary | ICD-10-CM | POA: Diagnosis not present

## 2017-04-07 DIAGNOSIS — S0990XA Unspecified injury of head, initial encounter: Secondary | ICD-10-CM | POA: Diagnosis present

## 2017-04-07 LAB — CBC
HCT: 36.8 % (ref 35.0–47.0)
Hemoglobin: 11.9 g/dL — ABNORMAL LOW (ref 12.0–16.0)
MCH: 31.3 pg (ref 26.0–34.0)
MCHC: 32.3 g/dL (ref 32.0–36.0)
MCV: 96.9 fL (ref 80.0–100.0)
PLATELETS: 247 10*3/uL (ref 150–440)
RBC: 3.8 MIL/uL (ref 3.80–5.20)
RDW: 15.2 % — AB (ref 11.5–14.5)
WBC: 6 10*3/uL (ref 3.6–11.0)

## 2017-04-07 LAB — BASIC METABOLIC PANEL
Anion gap: 12 (ref 5–15)
BUN: 46 mg/dL — AB (ref 6–20)
CALCIUM: 8.9 mg/dL (ref 8.9–10.3)
CO2: 25 mmol/L (ref 22–32)
CREATININE: 3.6 mg/dL — AB (ref 0.44–1.00)
Chloride: 101 mmol/L (ref 101–111)
GFR calc Af Amer: 12 mL/min — ABNORMAL LOW (ref >60)
GFR, EST NON AFRICAN AMERICAN: 10 mL/min — AB (ref >60)
GLUCOSE: 92 mg/dL (ref 65–99)
Potassium: 3.9 mmol/L (ref 3.5–5.1)
SODIUM: 138 mmol/L (ref 135–145)

## 2017-04-07 MED ORDER — ACETAMINOPHEN 325 MG PO TABS
650.0000 mg | ORAL_TABLET | Freq: Once | ORAL | Status: AC
  Administered 2017-04-07: 650 mg via ORAL
  Filled 2017-04-07: qty 2

## 2017-04-07 NOTE — ED Triage Notes (Addendum)
Pt to ED from [facility] after reporting having fallen asleep in wheelchair and fell forward hitting the right side of head, right shoulder and bilateral knees. Pt has noted hematoma to right side of head. No breaks in skin noted and no LOC reported. Pain in right shoulder and hx of broken clavicle with surgical intervention. No deformities noted to knees.

## 2017-04-07 NOTE — ED Notes (Signed)
RN called to update pts POA. [facility] has reported they do not have transportation available and POA has agreed to send pt back via EMS if there is no transportation.

## 2017-04-07 NOTE — ED Provider Notes (Signed)
[facility] Emergency Department Provider Note   ____________________________________________   First MD Initiated Contact with Patient 04/07/17 1259     (approximate)  I have reviewed the triage vital signs and the nursing notes.   HISTORY  Chief Complaint Fall; Head Injury; and Shoulder Injury    HPI Sharon [name] is a 84 y.o. female history of end-stage renal disease, recent surgery on her right clavicle, kidney failure, coronary disease  Patient was getting ready for dialysis, she reports that she was resting in the chair, fell asleep and then fell forward striking her face on the ground.  She reports that she has a lump over the right forehead.  Also increased discomfort in the area of her right clavicle where she had a recent surgery.  Small abrasion over the right clavicle.  No neck pain.  No nausea or vomiting.  Reports in her normal health, she does also reports that she falls often.  She currently lives at [facility].  She was due for dialysis today.  Denies shortness of breath, has had slight increase in swelling around her ankles over the last day which she reports will occur when she needs dialysis.  No chest pain.  Worse discomfort over the right clavicle, but is adamant that she cannot receive any pain medication other than Tylenol due to side effects that she experiences with any prescription pain medicines.  No numbness tingling or weakness.  Past Medical History:  Diagnosis Date  . Anemia   . Arthritis   . CAD (coronary artery disease)   . Cancer ([place])    skin  . Cardiomyopathy ([place])   . CHF (congestive heart failure) ([place])   . Depression   . IBS (irritable bowel syndrome) 2010  . Kidney failure July 2012   Hemodialysis 3xweek  . Kidney failure   . Meniere disease   . Meniere's disease   . Myocardial infarction ([place])   . OSA (obstructive sleep apnea)    CPAP  . Peritoneal dialysis status ([place])   . Presence of IVC filter  2019  . Presence of permanent cardiac pacemaker   . Renal insufficiency     Patient Active Problem List   Diagnosis Date Noted  . Complication from renal dialysis device 04/01/2017  . Displaced fracture of lateral end of right clavicle, initial encounter for closed fracture 02/16/2017  . FTT (failure to thrive) in adult 02/03/2017  . ESRD on hemodialysis ([place])   . Pleural effusion on right   . Palliative care by specialist   . Pulmonary embolism ([place]) 01/02/2017  . Syncope 01/01/2017  . Vision changes 08/27/2016  . Closed fracture of neck of femur ([place]) 05/26/2016  . Hip fracture ([place]) 05/10/2016  . Falls 04/27/2016  . Head injury 04/27/2016  . Age-related osteoporosis with current pathological fracture with routine healing 04/24/2016  . Recurrent falls 04/24/2016  . Mild protein-calorie malnutrition ([place]) 04/24/2016  . ESRD on dialysis ([place]) 04/10/2016  . Periprosthetic fracture around internal prosthetic left knee joint 01/26/2016  . Pressure injury of skin 12/15/2015  . Closed left subtrochanteric femur fracture ([place]) 12/15/2015  . Femur fracture, left ([place]) 12/14/2015  . Meniere disease   . Loss of weight 10/21/2015  . Clinical depression 06/20/2015  . Dysphagia, unspecified 05/24/2015  . Imbalance 04/22/2015  . Chronic pain syndrome 05/22/2014  . Insomnia 03/13/2014  . Anxiety state 03/13/2014  . Chronic systolic CHF (congestive heart failure), NYHA class 3 ([place]) 01/15/2014  . OSA (obstructive sleep  apnea) 01/15/2014  . Basal cell carcinoma of neck 11/14/2013  . DDD (degenerative disc disease), cervical 11/07/2013  . Cervical radiculitis 10/16/2013  . Benign essential hypertension 09/12/2013  . Memory loss 09/12/2013  . Systolic heart failure, chronic ([place]) 05/12/2013  . Goals of care, counseling/discussion 11/10/2012  . Sinus node dysfunction ([place]) 08/01/2012  . Low back pain 08/01/2012  . Cervical spine pain 05/02/2012  . Irritable bowel syndrome 11/02/2011  .  Depression 04/01/2011  . Hypertension 04/01/2011    Past Surgical History:  Procedure Laterality Date  . Joiner  . ABDOMINAL HYSTERECTOMY    . ANUS SURGERY  2010  . AV FISTULA PLACEMENT Right 04/15/2016   Procedure: ARTERIOVENOUS (AV) FISTULA CREATION ( RADIOCEPHALIC ) STAGE 2;  Surgeon: Algernon Huxley, MD;  Location: [facility] ORS;  Service: Vascular;  Laterality: Right;  . CATARACT EXTRACTION  2006, 2011  . CHOLECYSTECTOMY  01/2008  . DIALYSIS/PERMA CATHETER INSERTION N/A 01/07/2017   Procedure: DIALYSIS/PERMA CATHETER INSERTION With an IVC filter placement;  Surgeon: Algernon Huxley, MD;  Location: [place] [facility];  Service: Cardiovascular;  Laterality: N/A;  . DIALYSIS/PERMA CATHETER REMOVAL N/A 08/20/2016   Procedure: Dialysis/Perma Catheter Removal;  Surgeon: Algernon Huxley, MD;  Location: [place] [facility];  Service: Cardiovascular;  Laterality: N/A;  . EYE SURGERY     cataracts bilateral  . FEMUR IM NAIL Left 12/15/2015   Procedure: INTRAMEDULLARY (IM) RETROGRADE FEMORAL NAILING;  Surgeon: Oletta Cohn, DO;  Location: [facility] ORS;  Service: Orthopedics;  Laterality: Left;  . HIP PINNING,CANNULATED Left 05/10/2016   Procedure: CANNULATED HIP PINNING;  Surgeon: Earnestine Leys, MD;  Location: [facility] OR[facility];  Service: Orthopedics;  Laterality: Left;  . INSERT / REPLACE / REMOVE PACEMAKER    . INSERTION OF DIALYSIS CATHETER  07/2010  . IVC FILTER INSERTION  2019  . JOINT REPLACEMENT     left knee  . MINOR REMOVAL OF PERITONEAL DIALYSIS CATHETER  04/15/2016   Procedure: MINOR REMOVAL OF PERITONEAL DIALYSIS CATHETER;  Surgeon: Algernon Huxley, MD;  Location: [facility] OR[facility];  Service: Vascular;;  . ORIF CLAVICULAR FRACTURE Right 02/16/2017   Procedure: OPEN REDUCTION INTERNAL FIXATION (ORIF) CLAVICULAR FRACTURE;  Surgeon: Corky Mull, MD;  Location: [facility] ORS;  Service: Orthopedics;  Laterality: Right;  . PACEMAKER INSERTION  2006  . PERIPHERAL VASCULAR CATHETERIZATION N/A 12/18/2015    Procedure: Dialysis/Perma Catheter Insertion;  Surgeon: Katha Cabal, MD;  Location: [place] [facility];  Service: Cardiovascular;  Laterality: N/A;  . TOTAL KNEE ARTHROPLASTY  2008   LEFT/Dr [place]    Prior to Admission medications   Medication Sig Start Date End Date Taking? Authorizing Provider  acetaminophen (TYLENOL) 325 MG tablet Take 2 tablets (650 mg total) by mouth every 4 (four) hours as needed for mild pain or moderate pain. 05/14/16  Yes Wieting, Richard, MD  levothyroxine (SYNTHROID, LEVOTHROID) 25 MCG tablet Take 1 tablet (25 mcg total) by mouth daily. 02/04/17  Yes Mody, Ulice Bold, MD  metoCLOPramide (REGLAN) 5 MG tablet Take 1 tablet (5 mg total) by mouth every 8 (eight) hours as needed (vertigo). 05/30/16  Yes Menshew, Dannielle Karvonen, PA-C  sertraline (ZOLOFT) 100 MG tablet Take 200 mg by mouth daily.   Yes [provider]  traMADol (ULTRAM) 50 MG tablet Take 1-2 mg by mouth every 6 (six) hours as needed.   Yes [provider]  Vitamin D, Ergocalciferol, (DRISDOL) 50000 units CAPS capsule Take 50,000 Units by mouth every Monday.  04/17/15  Yes [provider]  folic acid-vitamin b complex-vitamin c-selenium-zinc (DIALYVITE) 3 MG TABS tablet Take 1 tablet by mouth daily.    [provider]  lidocaine (LIDODERM) 5 % Place 1 patch onto the skin daily. Remove & Discard patch within 12 hours or as directed by MD Patient not taking: Reported on 03/26/2017 02/18/17   Lattie Corns, PA-C  metoprolol tartrate (LOPRESSOR) 25 MG tablet Take 25 mg by mouth daily.    [provider]  Nutritional Supplements (FEEDING SUPPLEMENT, NEPRO CARB STEADY,) LIQD Take 237 mLs by mouth 2 (two) times daily between meals. Patient not taking: Reported on 03/26/2017 01/08/17   Loletha Grayer, MD  ramipril (ALTACE) 2.5 MG capsule Take 1 capsule (2.5 mg total) by mouth at bedtime. 01/08/17   Loletha Grayer, MD    Allergies Statins; Codeine sulfate; Codeine;  Effexor [venlafaxine]; and Ezetimibe-simvastatin  Family History  Problem Relation Age of Onset  . Cancer Mother        ? ovarian - sarcoma  . Cancer Father        Skin cancer  . Diabetes Sister   . COPD Sister   . Depression Sister   . Cancer Sister        Lung - 37 yrs old    Social History Social History   Tobacco Use  . Smoking status: Former Smoker    Last attempt to quit: 03/31/1948    Years since quitting: 69.0  . Smokeless tobacco: Never Used  Substance Use Topics  . Alcohol use: No  . Drug use: No    Review of Systems Constitutional: No fever/chills Eyes: No visual changes. ENT: No sore throat. Cardiovascular: Denies chest pain.  Pain over the right collarbone. Respiratory: Denies shortness of breath. Gastrointestinal: No abdominal pain.  No nausea, no vomiting.  No diarrhea.  No constipation. Genitourinary: Negative for dysuria. Musculoskeletal: Negative for back pain. Skin: Negative for rash. Neurological: Negative for headaches except sore over an area where she has swelling above her right scalp, no focal weakness or numbness.    ____________________________________________   PHYSICAL EXAM:  VITAL SIGNS: ED Triage Vitals  Enc Vitals Group     BP 04/07/17 1248 (!) 152/81     Pulse Rate 04/07/17 1248 86     Resp 04/07/17 1248 16     Temp 04/07/17 1248 97.7 F (36.5 C)     Temp Source 04/07/17 1248 Axillary     SpO2 04/07/17 1248 100 %     Weight 04/07/17 1249 109 lb 2 oz (49.5 kg)     Height 04/07/17 1249 5\' 4"  (1.626 m)     Head Circumference --      Peak Flow --      Pain Score 04/07/17 1249 10     Pain Loc --      Pain Edu? --      Excl. in [place]? --     Constitutional: Alert and oriented. Well appearing and in no acute distress.  Slightly frail in appearance. Eyes: Conjunctivae are normal. Head: Atraumatic. Nose: No congestion/rhinnorhea. Mouth/Throat: Mucous membranes are moist. Neck: No stridor.  No midline cervical  tenderness. Cardiovascular: Normal rate, regular rhythm. Grossly normal heart sounds.  Good peripheral circulation.  Right upper chest has a slight abrasion overlying the right clavicle, previous surgical site is clean dry and intact.  Patient reports focal and reproducible pain to palpation of the mid right clavicle.  There is no notable acute deformity.   Respiratory:  Normal respiratory effort.  No retractions. Lungs CTAB except for some moderate Lee diminished lung sounds over the right base. Gastrointestinal: Soft and nontender. No distention. Musculoskeletal: No lower extremity tenderness with trace edema both ankles. Neurologic:  Normal speech and language. No gross focal neurologic deficits are appreciated.  Skin:  Skin is warm, dry and intact. No rash noted. Psychiatric: Mood and affect are normal. Speech and behavior are normal.  ____________________________________________   LABS (all labs ordered are listed, but only abnormal results are displayed)  Labs Reviewed  CBC - Abnormal; Notable for the following components:      Result Value   Hemoglobin 11.9 (*)    RDW 15.2 (*)    All other components within normal limits  BASIC METABOLIC PANEL - Abnormal; Notable for the following components:   BUN 46 (*)    Creatinine, Ser 3.60 (*)    GFR calc non Af Amer 10 (*)    GFR calc Af Amer 12 (*)    All other components within normal limits  CBG MONITORING, ED   ____________________________________________  EKG   ____________________________________________  RADIOLOGY  Notable right sided pleural effusion, reviewed by me.  No acute traumatic injury denoted.  Review of previous records indicate the patient does have a history of recurring right pleural effusion. ____________________________________________   PROCEDURES  Procedure(s) performed: None  Procedures  Critical Care performed: No  ____________________________________________   INITIAL IMPRESSION / ASSESSMENT  AND PLAN / ED COURSE  Pertinent labs & imaging results that were available during my care of the patient were reviewed by me and considered in my medical decision making (see chart for details).  Patient presents after a fall.  She was waiting in a chair to go to dialysis, she reports she started to doze off and fell forward striking the ground.  CT does not demonstrate intracranial hemorrhage.  Small hematoma is noted over the right forehead.  She is alert well oriented no distress.  She does request Tylenol only for pain control and refuses anything additional.  She does report pain over her right clavicle, but there does not appear to be an acute injury and her x-ray does not demonstrate acute break.  Known history of clavicular disease and repair, appears stable.  Discussed patient's case with Dr. Abigail Butts, he advises the patient can have her dialysis done at her center tomorrow.  This seems agreeable, patient denies any dyspnea, she has a pleural effusion but is known to be recurrent and not have any associated increased work of breathing associated.  Does not appear that it would be consistent with an acute hemothorax. Hgb improved.      ----------------------------------------- 6:23 PM on 04/07/2017 -----------------------------------------  Patient resting comfortably, understanding agreeable with plan for discharge and close follow-up with dialysis and primary care.  Discussed with the patient the fluid on her right lung, she reports that she is aware of it and has discussed it with her doctor several times, she will discussed with her physician tomorrow as well.  She has several follow-ups tomorrow including doctor's appointment in the morning, and will be scheduling her dialysis for the afternoon. Return precautions and treatment recommendations and follow-up discussed with the patient who is agreeable with the plan.  ____________________________________________   FINAL CLINICAL  IMPRESSION(S) / ED DIAGNOSES  Final diagnoses:  Hematoma of frontal scalp, initial encounter  Recurrent pleural effusion on right      NEW MEDICATIONS STARTED DURING THIS VISIT:  New Prescriptions   No medications  on file     Note:  This document was prepared using Dragon voice recognition software and may include unintentional dictation errors.     Delman Kitten, MD 04/07/17 [id number]

## 2017-04-07 NOTE — ED Notes (Signed)
EMS  CALLED FOR  TRANSPORT

## 2017-04-07 NOTE — ED Notes (Signed)
RN called [facility] [employer] practitioner to request the name of the facility pt goes for dialysis. RN updated that pt goes to [facility] in [place] closed at 3 this afternoon.

## 2017-04-07 NOTE — Discharge Instructions (Signed)
Call your dialysis center tomorrow at 6AM (when they open) for an appointment to have your dialysis completed tomorrow. This is very important.  Return to the emergency room right away if you begin to develop shortness of breath, chest pain, vomiting, weakness, or other new concerns arise.

## 2017-04-07 NOTE — ED Notes (Signed)
Pt taken home via EMS while this RN was in an emergency traffic. Pt in NAD and EMS was able to load pt without difficulty. Report had been called and POA was updated. PT in NAD at this time.

## 2017-04-07 NOTE — ED Notes (Signed)
[facility] here to transport patient to facility

## 2017-04-09 ENCOUNTER — Encounter (INDEPENDENT_AMBULATORY_CARE_PROVIDER_SITE_OTHER): Payer: Self-pay

## 2017-04-12 ENCOUNTER — Other Ambulatory Visit (INDEPENDENT_AMBULATORY_CARE_PROVIDER_SITE_OTHER): Payer: Self-pay | Admitting: Vascular Surgery

## 2017-04-13 ENCOUNTER — Ambulatory Visit: Admission: RE | Admit: 2017-04-13 | Payer: Medicare Other | Source: Ambulatory Visit | Admitting: Vascular Surgery

## 2017-04-13 ENCOUNTER — Encounter: Admission: RE | Payer: Self-pay | Source: Ambulatory Visit

## 2017-04-13 SURGERY — DIALYSIS/PERMA CATHETER REMOVAL
Anesthesia: LOCAL

## 2017-04-14 ENCOUNTER — Other Ambulatory Visit (INDEPENDENT_AMBULATORY_CARE_PROVIDER_SITE_OTHER): Payer: Self-pay | Admitting: Vascular Surgery

## 2017-04-14 ENCOUNTER — Ambulatory Visit (INDEPENDENT_AMBULATORY_CARE_PROVIDER_SITE_OTHER): Payer: Medicare Other

## 2017-04-14 ENCOUNTER — Encounter (INDEPENDENT_AMBULATORY_CARE_PROVIDER_SITE_OTHER): Payer: Self-pay | Admitting: Vascular Surgery

## 2017-04-14 ENCOUNTER — Ambulatory Visit (INDEPENDENT_AMBULATORY_CARE_PROVIDER_SITE_OTHER): Payer: Medicare Other | Admitting: Vascular Surgery

## 2017-04-14 VITALS — BP 134/62 | HR 64 | Resp 14 | Ht 64.0 in | Wt 104.0 lb

## 2017-04-14 DIAGNOSIS — M79604 Pain in right leg: Secondary | ICD-10-CM

## 2017-04-14 DIAGNOSIS — M79605 Pain in left leg: Secondary | ICD-10-CM

## 2017-04-14 DIAGNOSIS — Z992 Dependence on renal dialysis: Secondary | ICD-10-CM

## 2017-04-14 DIAGNOSIS — M79606 Pain in leg, unspecified: Secondary | ICD-10-CM

## 2017-04-14 DIAGNOSIS — N186 End stage renal disease: Secondary | ICD-10-CM | POA: Diagnosis not present

## 2017-04-14 NOTE — Progress Notes (Signed)
Subjective:    Patient ID: Sharon [name], female    DOB: [birth year], 84 y.o.   MRN: [id number] Chief Complaint  Patient presents with  . Follow-up    ABI f/u   Patient is well-known to our practice as the following her dialysis access for end-stage renal disease on chronic hemodialysis.  The patient was scheduled to have her left IJ PermCath removed yesterday however recently broke her collarbone on the same side as her right upper extremity fistula.  The patient and her family member very hesitant to remove the PermCath until her collarbone is healed as they feel her need to have her right upper extremity immobile during this time will interfere with dialysis.  They understand that leaving the PermCath in increases her risk for infection and sepsis.  They are both willing to accept that risk.  The patient presents today to with a chief complaint of bilateral leg pain.  The patient does not ambulate much and does sit in her wheelchair most of the day.  Patient notes the pain radiates up and down her legs.  The patient denies any rest pain.  The patient does get occasional ulcerations to her calf.  The patient does experience occasional swelling to the bilateral lower extremity left worse than right.  The patient underwent a bilateral ABI which was notable for no evidence of significant lower extremity arterial disease.  Right ABI 1.29 left ABI 1.27.  Triphasic tibials bilaterally.  The patient denies any fever, nausea vomiting.   Review of Systems  Constitutional: Negative.   HENT: Negative.   Eyes: Negative.   Respiratory: Negative.   Cardiovascular: Positive for leg swelling.       Lower extremity pain  Endocrine: Negative.   Genitourinary:       End-stage renal disease  Musculoskeletal: Negative.   Skin: Negative.   Allergic/Immunologic: Negative.   Neurological: Negative.   Hematological: Negative.   Psychiatric/Behavioral: Negative.       Objective:   Physical Exam    Constitutional: She is oriented to person, place, and time. She appears well-developed and well-nourished. No distress.  HENT:  Head: Normocephalic.  Eyes: Conjunctivae are normal. Pupils are equal, round, and reactive to light.  Neck: Normal range of motion.  Cardiovascular: Normal rate, regular rhythm, normal heart sounds and intact distal pulses.  Pulses:      Radial pulses are 2+ on the right side.  Right upper extremity: Good bruit and thrill noted to dialysis access.  Skin is intact. Left IJ PermCath: Intact.  No signs of infection. Hard to palpate pedal pulses  Pulmonary/Chest: Effort normal and breath sounds normal.  Musculoskeletal: Normal range of motion. She exhibits edema (Mild to moderate bilateral lower extremity edema.  Left leg is more edematous than right.).  Neurological: She is alert and oriented to person, place, and time.  Skin: She is not diaphoretic.  Mild stasis dermatitis noted.  There is no cellulitis, skin changes or ulcerations noted.  Psychiatric: She has a normal mood and affect. Her behavior is normal. Judgment and thought content normal.  Vitals reviewed.  BP 134/62 (BP Location: Left Arm, Patient Position: Sitting)   Pulse 64   Resp 14   Ht 5\' 4"  (1.626 m)   Wt 104 lb (47.2 kg)   BMI 17.85 kg/m   Past Medical History:  Diagnosis Date  . Anemia   . Arthritis   . CAD (coronary artery disease)   . Cancer ([place])    skin  .  Cardiomyopathy ([place])   . CHF (congestive heart failure) ([place])   . Depression   . IBS (irritable bowel syndrome) 2010  . Kidney failure July 2012   Hemodialysis 3xweek  . Kidney failure   . Meniere disease   . Meniere's disease   . Myocardial infarction ([place])   . OSA (obstructive sleep apnea)    CPAP  . Peritoneal dialysis status ([place])   . Presence of IVC filter 2019  . Presence of permanent cardiac pacemaker   . Renal insufficiency    Social History   Socioeconomic History  . Marital status: Widowed    Spouse  name: Not on file  . Number of children: 0  . Years of education: Not on file  . Highest education level: Not on file  Social Needs  . Financial resource strain: Not on file  . Food insecurity - worry: Not on file  . Food insecurity - inability: Not on file  . Transportation needs - medical: Not on file  . Transportation needs - non-medical: Not on file  Occupational History  . Occupation: Retired   Tobacco Use  . Smoking status: Former Smoker    Last attempt to quit: 03/31/1948    Years since quitting: 69.0  . Smokeless tobacco: Never Used  Substance and Sexual Activity  . Alcohol use: No  . Drug use: No  . Sexual activity: No  Other Topics Concern  . Not on file  Social History Narrative   Lives in [place] alone. Widow 2012.      Regular Exercise -  Housework   Daily Caffeine Use:  1 - 2 cups coffee            Past Surgical History:  Procedure Laterality Date  . [place]  . ABDOMINAL HYSTERECTOMY    . ANUS SURGERY  2010  . AV FISTULA PLACEMENT Right 04/15/2016   Procedure: ARTERIOVENOUS (AV) FISTULA CREATION ( RADIOCEPHALIC ) STAGE 2;  Surgeon: Algernon Huxley, MD;  Location: [facility];  Service: Vascular;  Laterality: Right;  . CATARACT EXTRACTION  2006, 2011  . CHOLECYSTECTOMY  01/2008  . DIALYSIS/PERMA CATHETER INSERTION N/A 01/07/2017   Procedure: DIALYSIS/PERMA CATHETER INSERTION With an IVC filter placement;  Surgeon: Algernon Huxley, MD;  Location: [facility];  Service: Cardiovascular;  Laterality: N/A;  . DIALYSIS/PERMA CATHETER REMOVAL N/A 08/20/2016   Procedure: Dialysis/Perma Catheter Removal;  Surgeon: Algernon Huxley, MD;  Location: [facility];  Service: Cardiovascular;  Laterality: N/A;  . EYE SURGERY     cataracts bilateral  . FEMUR IM NAIL Left 12/15/2015   Procedure: INTRAMEDULLARY (IM) RETROGRADE FEMORAL NAILING;  Surgeon: Oletta Cohn, DO;  Location: [facility];  Service: Orthopedics;  Laterality: Left;  . HIP  PINNING,CANNULATED Left 05/10/2016   Procedure: CANNULATED HIP PINNING;  Surgeon: Earnestine Leys, MD;  Location: [facility];  Service: Orthopedics;  Laterality: Left;  . INSERT / REPLACE / REMOVE PACEMAKER    . INSERTION OF DIALYSIS CATHETER  07/2010  . IVC FILTER INSERTION  2019  . JOINT REPLACEMENT     left knee  . MINOR REMOVAL OF PERITONEAL DIALYSIS CATHETER  04/15/2016   Procedure: MINOR REMOVAL OF PERITONEAL DIALYSIS CATHETER;  Surgeon: Algernon Huxley, MD;  Location: [facility];  Service: Vascular;;  . ORIF CLAVICULAR FRACTURE Right 02/16/2017   Procedure: OPEN REDUCTION INTERNAL FIXATION (ORIF) CLAVICULAR FRACTURE;  Surgeon: Corky Mull, MD;  Location: [facility];  Service: Orthopedics;  Laterality: Right;  .  PACEMAKER INSERTION  2006  . PERIPHERAL VASCULAR CATHETERIZATION N/A 12/18/2015   Procedure: Dialysis/Perma Catheter Insertion;  Surgeon: Katha Cabal, MD;  Location: [facility];  Service: Cardiovascular;  Laterality: N/A;  . TOTAL KNEE ARTHROPLASTY  2008   LEFT/Dr [place]   Family History  Problem Relation Age of Onset  . Cancer Mother        ? ovarian - sarcoma  . Cancer Father        Skin cancer  . Diabetes Sister   . COPD Sister   . Depression Sister   . Cancer Sister        Lung - 56 yrs old   Allergies  Allergen Reactions  . Statins Other (See Comments)    Muscle weakness severe  . Codeine Sulfate Nausea And Vomiting  . Codeine Other (See Comments)    GI UPSET  . Effexor [Venlafaxine] Nausea Only  . Ezetimibe-Simvastatin Other (See Comments)    Muscle weakness      Assessment & Plan:  Patient is well-known to our practice as the following her dialysis access for end-stage renal disease on chronic hemodialysis.  The patient was scheduled to have her left IJ PermCath removed yesterday however recently broke her collarbone on the same side as her right upper extremity fistula.  The patient and her family member very hesitant to remove the PermCath until her  collarbone is healed as they feel her need to have her right upper extremity immobile during this time will interfere with dialysis.  They understand that leaving the PermCath in increases her risk for infection and sepsis.  They are both willing to accept that risk.  The patient presents today to with a chief complaint of bilateral leg pain.  The patient does not ambulate much and does sit in her wheelchair most of the day.  Patient notes the pain radiates up and down her legs.  The patient denies any rest pain.  The patient does get occasional ulcerations to her calf.  The patient does experience occasional swelling to the bilateral lower extremity left worse than right.  The patient underwent a bilateral ABI which was notable for no evidence of significant lower extremity arterial disease.  Right ABI 1.29 left ABI 1.27.  Triphasic tibials bilaterally.  The patient denies any fever, nausea vomiting.  1. ESRD on hemodialysis ([place]) - Stable Patient continues to be maintained by left IJ PermCath without issue At this time, due to the patient's right broken collarbone her arm is immobile during the healing process.  The patient and her son are hesitant on removing the PermCath as they feel her arm and mobility will affect her ability to dialyze.  They understand that the longer the PermCath is in place she is at risk for infection.  They are willing to accept that risk. The patient has a good bruit and thrill on exam.  Skin is intact to the right upper extremity fistula I have asked the nursing home staff to please sterilely change the patient's PermCath dressing on a daily basis.  2. Pain in both lower extremities - New Patient with normal ABIs today. The patient has never been worked up for venous reflux or lymphedema I think this may be a contributing factor to her discomfort The patient does not engage in conservative therapy at this time The patient was encouraged to wear graduated compression  stockings (20-30 mmHg) on a daily basis. The patient was instructed to begin wearing the stockings first thing  in the morning and removing them in the evening. The patient was instructed specifically not to sleep in the stockings. Prescription given. In addition, behavioral modification including elevation during the day will be initiated. Anti-inflammatories for pain. The patient was instructed to call the office in the interim if any worsening edema or ulcerations to the legs, feet or toes occurs. The patient expresses their understanding. I will bring the patient back in approximately 1 month to assess her progress with conservative therapy The patient may be a candidate for lymphedema pump in the future  - VAS [place] LOWER EXTREMITY VENOUS REFLUX; Future  Current Outpatient Medications on File Prior to Visit  Medication Sig Dispense Refill  . acetaminophen (TYLENOL) 325 MG tablet Take 2 tablets (650 mg total) by mouth every 4 (four) hours as needed for mild pain or moderate pain.    . diphenoxylate-atropine (LOMOTIL) 2.5-0.025 MG tablet Take by mouth 4 (four) times daily as needed for diarrhea or loose stools.    . folic acid-vitamin b complex-vitamin c-selenium-zinc (DIALYVITE) 3 MG TABS tablet Take 1 tablet by mouth daily.    Marland Kitchen levothyroxine (SYNTHROID, LEVOTHROID) 25 MCG tablet Take 1 tablet (25 mcg total) by mouth daily.    . metoCLOPramide (REGLAN) 5 MG tablet Take 1 tablet (5 mg total) by mouth every 8 (eight) hours as needed (vertigo). 15 tablet 0  . metoprolol tartrate (LOPRESSOR) 25 MG tablet Take 25 mg by mouth daily.    . ramipril (ALTACE) 2.5 MG capsule Take 1 capsule (2.5 mg total) by mouth at bedtime. 30 capsule 0  . sertraline (ZOLOFT) 100 MG tablet Take 200 mg by mouth daily.    . traMADol (ULTRAM) 50 MG tablet Take 1-2 mg by mouth every 6 (six) hours as needed.    . Vitamin D, Ergocalciferol, (DRISDOL) 50000 units CAPS capsule Take 50,000 Units by mouth every Monday.     .  lidocaine (LIDODERM) 5 % Place 1 patch onto the skin daily. Remove & Discard patch within 12 hours or as directed by MD (Patient not taking: Reported on 03/26/2017) 15 patch 0  . Nutritional Supplements (FEEDING SUPPLEMENT, NEPRO CARB STEADY,) LIQD Take 237 mLs by mouth 2 (two) times daily between meals. (Patient not taking: Reported on 03/26/2017) 60 Can 0   No current facility-administered medications on file prior to visit.    There are no Patient Instructions on file for this visit. No Follow-up on file.  Lamondre Wesche A Anelis Hrivnak, PA-C

## 2017-04-21 ENCOUNTER — Other Ambulatory Visit: Payer: Self-pay

## 2017-04-21 ENCOUNTER — Emergency Department
Admission: EM | Admit: 2017-04-21 | Discharge: 2017-04-21 | Disposition: A | Discharge status: Home / Self Care | Payer: Medicare Other | Attending: Emergency Medicine | Admitting: Emergency Medicine

## 2017-04-21 ENCOUNTER — Emergency Department: Payer: Medicare Other

## 2017-04-21 ENCOUNTER — Encounter: Payer: Self-pay | Admitting: Emergency Medicine

## 2017-04-21 DIAGNOSIS — W1830XA Fall on same level, unspecified, initial encounter: Secondary | ICD-10-CM | POA: Insufficient documentation

## 2017-04-21 DIAGNOSIS — R0789 Other chest pain: Secondary | ICD-10-CM | POA: Diagnosis not present

## 2017-04-21 DIAGNOSIS — Y92129 Unspecified place in nursing home as the place of occurrence of the external cause: Secondary | ICD-10-CM | POA: Diagnosis not present

## 2017-04-21 DIAGNOSIS — S4991XA Unspecified injury of right shoulder and upper arm, initial encounter: Secondary | ICD-10-CM | POA: Diagnosis present

## 2017-04-21 DIAGNOSIS — R296 Repeated falls: Secondary | ICD-10-CM

## 2017-04-21 DIAGNOSIS — S0083XA Contusion of other part of head, initial encounter: Secondary | ICD-10-CM | POA: Insufficient documentation

## 2017-04-21 DIAGNOSIS — Y999 Unspecified external cause status: Secondary | ICD-10-CM | POA: Insufficient documentation

## 2017-04-21 DIAGNOSIS — Y939 Activity, unspecified: Secondary | ICD-10-CM | POA: Insufficient documentation

## 2017-04-21 DIAGNOSIS — S42001A Fracture of unspecified part of right clavicle, initial encounter for closed fracture: Secondary | ICD-10-CM | POA: Diagnosis not present

## 2017-04-21 DIAGNOSIS — T148XXA Other injury of unspecified body region, initial encounter: Secondary | ICD-10-CM | POA: Insufficient documentation

## 2017-04-21 DIAGNOSIS — R479 Unspecified speech disturbances: Secondary | ICD-10-CM | POA: Diagnosis not present

## 2017-04-21 DIAGNOSIS — M542 Cervicalgia: Secondary | ICD-10-CM | POA: Diagnosis not present

## 2017-04-21 LAB — CBC WITH DIFFERENTIAL/PLATELET
BASOS ABS: 0.1 10*3/uL (ref 0–0.1)
BASOS PCT: 1 %
EOS ABS: 0.3 10*3/uL (ref 0–0.7)
EOS PCT: 4 %
HCT: 39 % (ref 35.0–47.0)
Hemoglobin: 12.8 g/dL (ref 12.0–16.0)
LYMPHS PCT: 13 %
Lymphs Abs: 0.9 10*3/uL — ABNORMAL LOW (ref 1.0–3.6)
MCH: 30.6 pg (ref 26.0–34.0)
MCHC: 32.8 g/dL (ref 32.0–36.0)
MCV: 93.3 fL (ref 80.0–100.0)
Monocytes Absolute: 0.5 10*3/uL (ref 0.2–0.9)
Monocytes Relative: 8 %
Neutro Abs: 5.2 10*3/uL (ref 1.4–6.5)
Neutrophils Relative %: 74 %
PLATELETS: 264 10*3/uL (ref 150–440)
RBC: 4.18 MIL/uL (ref 3.80–5.20)
RDW: 14.9 % — ABNORMAL HIGH (ref 11.5–14.5)
WBC: 6.9 10*3/uL (ref 3.6–11.0)

## 2017-04-21 LAB — BASIC METABOLIC PANEL
Anion gap: 13 (ref 5–15)
BUN: 55 mg/dL — AB (ref 6–20)
CALCIUM: 8.8 mg/dL — AB (ref 8.9–10.3)
CO2: 24 mmol/L (ref 22–32)
Chloride: 99 mmol/L — ABNORMAL LOW (ref 101–111)
Creatinine, Ser: 4 mg/dL — ABNORMAL HIGH (ref 0.44–1.00)
GFR calc Af Amer: 11 mL/min — ABNORMAL LOW (ref >60)
GFR, EST NON AFRICAN AMERICAN: 9 mL/min — AB (ref >60)
Glucose, Bld: 92 mg/dL (ref 65–99)
POTASSIUM: 4.9 mmol/L (ref 3.5–5.1)
SODIUM: 136 mmol/L (ref 135–145)

## 2017-04-21 LAB — CK: CK TOTAL: 71 U/L (ref 38–234)

## 2017-04-21 LAB — TROPONIN I
TROPONIN I: 0.08 ng/mL — AB (ref <0.03)
TROPONIN I: 0.09 ng/mL — AB (ref <0.03)

## 2017-04-21 NOTE — ED Triage Notes (Signed)
Pt to ED via [facility] from [facility]. Pt has recently hx/o multiple falls. Pt is a dialysis patient. Pt currently c/o right clavicle pain. Pt in NAD at this time. Dr. Reita Cliche at bedside.

## 2017-04-21 NOTE — ED Notes (Signed)
Pt returned from xray

## 2017-04-21 NOTE — ED Notes (Signed)
Pt transported to CT by CT tech

## 2017-04-21 NOTE — ED Notes (Addendum)
Pt gave verbal OK to speak with her brother, Nelle Don.    Phone Number: [phone]

## 2017-04-21 NOTE — ED Notes (Signed)
Spoke with Ed and updated him on patients status.

## 2017-04-21 NOTE — ED Notes (Addendum)
Sling applied to right arm. Patient moved from stretcher to wheelchair and taken to lobby by EDT. Consent for discharge given by patient's brother, Ed.

## 2017-04-21 NOTE — ED Provider Notes (Signed)
[facility] Emergency Department Provider Note ____________________________________________   I have reviewed the triage vital signs and the triage nursing note.  HISTORY  Chief Complaint Fall   Historian Level 5 Caveat History Limited by poor historian, questionable altered mental status?  HPI Sharon [name] is a 84 y.o. female presents from nursing home after unwitnessed fall overnight.  Patient states that she has vertigo and fell after staff left the room and was on the floor for about an hour.  Unclear to me why she is coming in now this morning as opposed to overnight when she is found.  In any case, she has a history of a history of frequent falls, and prior known right collarbone fractures.  She does have some bruising to her face.  She is complaining of neck pain.  She is complaining of right collarbone pain.  She is complaining of right rib pain.  She is really rambling in terms of her conversation.   Past Medical History:  Diagnosis Date  . Anemia   . Arthritis   . CAD (coronary artery disease)   . Cancer ([place])    skin  . Cardiomyopathy ([place])   . CHF (congestive heart failure) ([place])   . Depression   . IBS (irritable bowel syndrome) 2010  . Kidney failure July 2012   Hemodialysis 3xweek  . Kidney failure   . Meniere disease   . Meniere's disease   . Myocardial infarction ([place])   . OSA (obstructive sleep apnea)    CPAP  . Peritoneal dialysis status ([place])   . Presence of IVC filter 2019  . Presence of permanent cardiac pacemaker   . Renal insufficiency     Patient Active Problem List   Diagnosis Date Noted  . Complication from renal dialysis device 04/01/2017  . Displaced fracture of lateral end of right clavicle, initial encounter for closed fracture 02/16/2017  . FTT (failure to thrive) in adult 02/03/2017  . ESRD on hemodialysis ([place])   . Pleural effusion on right   . Palliative care by specialist   . Pulmonary embolism  ([place]) 01/02/2017  . Syncope 01/01/2017  . Vision changes 08/27/2016  . Closed fracture of neck of femur ([place]) 05/26/2016  . Hip fracture ([place]) 05/10/2016  . Falls 04/27/2016  . Head injury 04/27/2016  . Age-related osteoporosis with current pathological fracture with routine healing 04/24/2016  . Recurrent falls 04/24/2016  . Mild protein-calorie malnutrition ([place]) 04/24/2016  . ESRD on dialysis ([place]) 04/10/2016  . Periprosthetic fracture around internal prosthetic left knee joint 01/26/2016  . Pressure injury of skin 12/15/2015  . Closed left subtrochanteric femur fracture ([place]) 12/15/2015  . Femur fracture, left ([place]) 12/14/2015  . Meniere disease   . Loss of weight 10/21/2015  . Clinical depression 06/20/2015  . Dysphagia, unspecified 05/24/2015  . Imbalance 04/22/2015  . Chronic pain syndrome 05/22/2014  . Insomnia 03/13/2014  . Anxiety state 03/13/2014  . Chronic systolic CHF (congestive heart failure), NYHA class 3 ([place]) 01/15/2014  . OSA (obstructive sleep apnea) 01/15/2014  . Basal cell carcinoma of neck 11/14/2013  . DDD (degenerative disc disease), cervical 11/07/2013  . Cervical radiculitis 10/16/2013  . Benign essential hypertension 09/12/2013  . Memory loss 09/12/2013  . Systolic heart failure, chronic ([place]) 05/12/2013  . Goals of care, counseling/discussion 11/10/2012  . Sinus node dysfunction ([place]) 08/01/2012  . Low back pain 08/01/2012  . Cervical spine pain 05/02/2012  . Irritable bowel syndrome 11/02/2011  . Depression 04/01/2011  .  Hypertension 04/01/2011    Past Surgical History:  Procedure Laterality Date  . [place]  . ABDOMINAL HYSTERECTOMY    . ANUS SURGERY  2010  . AV FISTULA PLACEMENT Right 04/15/2016   Procedure: ARTERIOVENOUS (AV) FISTULA CREATION ( RADIOCEPHALIC ) STAGE 2;  Surgeon: Algernon Huxley, MD;  Location: [facility] ORS;  Service: Vascular;  Laterality: Right;  . CATARACT EXTRACTION  2006, 2011  . CHOLECYSTECTOMY   01/2008  . DIALYSIS/PERMA CATHETER INSERTION N/A 01/07/2017   Procedure: DIALYSIS/PERMA CATHETER INSERTION With an IVC filter placement;  Surgeon: Algernon Huxley, MD;  Location: [facility];  Service: Cardiovascular;  Laterality: N/A;  . DIALYSIS/PERMA CATHETER REMOVAL N/A 08/20/2016   Procedure: Dialysis/Perma Catheter Removal;  Surgeon: Algernon Huxley, MD;  Location: [facility];  Service: Cardiovascular;  Laterality: N/A;  . EYE SURGERY     cataracts bilateral  . FEMUR IM NAIL Left 12/15/2015   Procedure: INTRAMEDULLARY (IM) RETROGRADE FEMORAL NAILING;  Surgeon: Oletta Cohn, DO;  Location: [facility];  Service: Orthopedics;  Laterality: Left;  . HIP PINNING,CANNULATED Left 05/10/2016   Procedure: CANNULATED HIP PINNING;  Surgeon: Earnestine Leys, MD;  Location: [facility];  Service: Orthopedics;  Laterality: Left;  . INSERT / REPLACE / REMOVE PACEMAKER    . INSERTION OF DIALYSIS CATHETER  07/2010  . IVC FILTER INSERTION  2019  . JOINT REPLACEMENT     left knee  . MINOR REMOVAL OF PERITONEAL DIALYSIS CATHETER  04/15/2016   Procedure: MINOR REMOVAL OF PERITONEAL DIALYSIS CATHETER;  Surgeon: Algernon Huxley, MD;  Location: [facility];  Service: Vascular;;  . ORIF CLAVICULAR FRACTURE Right 02/16/2017   Procedure: OPEN REDUCTION INTERNAL FIXATION (ORIF) CLAVICULAR FRACTURE;  Surgeon: Corky Mull, MD;  Location: [facility];  Service: Orthopedics;  Laterality: Right;  . PACEMAKER INSERTION  2006  . PERIPHERAL VASCULAR CATHETERIZATION N/A 12/18/2015   Procedure: Dialysis/Perma Catheter Insertion;  Surgeon: Katha Cabal, MD;  Location: [facility];  Service: Cardiovascular;  Laterality: N/A;  . TOTAL KNEE ARTHROPLASTY  2008   LEFT/Dr [place]    Prior to Admission medications   Medication Sig Start Date End Date Taking? Authorizing Provider  acetaminophen (TYLENOL) 325 MG tablet Take 2 tablets (650 mg total) by mouth every 4 (four) hours as needed for mild pain or moderate pain.  05/14/16  Yes Wieting, Richard, MD  diphenoxylate-atropine (LOMOTIL) 2.5-0.025 MG tablet Take by mouth 4 (four) times daily as needed for diarrhea or loose stools.   Yes [provider]  folic acid-vitamin b complex-vitamin c-selenium-zinc (DIALYVITE) 3 MG TABS tablet Take 1 tablet by mouth daily.   Yes [provider]  levothyroxine (SYNTHROID, LEVOTHROID) 25 MCG tablet Take 1 tablet (25 mcg total) by mouth daily. 02/04/17  Yes Mody, Ulice Bold, MD  metoCLOPramide (REGLAN) 5 MG tablet Take 1 tablet (5 mg total) by mouth every 8 (eight) hours as needed (vertigo). 05/30/16  Yes Menshew, Dannielle Karvonen, PA-C  metoprolol tartrate (LOPRESSOR) 25 MG tablet Take 25 mg by mouth daily.   Yes [provider]  ramipril (ALTACE) 2.5 MG capsule Take 1 capsule (2.5 mg total) by mouth at bedtime. 01/08/17  Yes Wieting, Richard, MD  sertraline (ZOLOFT) 100 MG tablet Take 200 mg by mouth daily.   Yes [provider]  traMADol (ULTRAM) 50 MG tablet Take 50 mg by mouth every 6 (six) hours as needed.    Yes [provider]  Vitamin D, Ergocalciferol, (DRISDOL)  50000 units CAPS capsule Take 50,000 Units by mouth every Monday.  04/17/15  Yes [provider]  lidocaine (LIDODERM) 5 % Place 1 patch onto the skin daily. Remove & Discard patch within 12 hours or as directed by MD Patient not taking: Reported on 03/26/2017 02/18/17   Lattie Corns, PA-C  Nutritional Supplements (FEEDING SUPPLEMENT, NEPRO CARB STEADY,) LIQD Take 237 mLs by mouth 2 (two) times daily between meals. Patient not taking: Reported on 03/26/2017 01/08/17   Loletha Grayer, MD    Allergies  Allergen Reactions  . Statins Other (See Comments)    Muscle weakness severe  . Codeine Sulfate Nausea And Vomiting  . Codeine Other (See Comments)    GI UPSET  . Effexor [Venlafaxine] Nausea Only  . Ezetimibe-Simvastatin Other (See Comments)    Muscle weakness    Family History  Problem Relation Age of  Onset  . Cancer Mother        ? ovarian - sarcoma  . Cancer Father        Skin cancer  . Diabetes Sister   . COPD Sister   . Depression Sister   . Cancer Sister        Lung - 49 yrs old    Social History Social History   Tobacco Use  . Smoking status: Former Smoker    Last attempt to quit: 03/31/1948    Years since quitting: 69.1  . Smokeless tobacco: Never Used  Substance Use Topics  . Alcohol use: No  . Drug use: No    Review of Systems  Constitutional: Negative for fever. Eyes: Negative for visual changes. ENT: Negative for sore throat. Cardiovascular: Negative for chest pain. Respiratory: Negative for shortness of breath. Gastrointestinal: Negative for abdominal pain, vomiting and diarrhea. Genitourinary: Negative for dysuria. Musculoskeletal: Negative for back pain.  Right collarbone pain, right neck pain, right rib pain as per HPI. Skin: Negative for rash. Neurological: Negative for headache.  ____________________________________________   PHYSICAL EXAM:  VITAL SIGNS: ED Triage Vitals  Enc Vitals Group     BP 04/21/17 1032 (!) 151/64     Pulse Rate 04/21/17 1030 73     Resp 04/21/17 1030 16     Temp 04/21/17 1030 97.6 F (36.4 C)     Temp Source 04/21/17 1030 Oral     SpO2 04/21/17 1030 95 %     Weight --      Height --      Head Circumference --      Peak Flow --      Pain Score --      Pain Loc --      Pain Edu? --      Excl. in [place]? --      Constitutional: Alert and oriented. Well appearing and in no distress.  Cachectic. HEENT   Head: Normocephalic.  Some peripheral bruising to the face along the right cheek and chin.  No jaw malocclusion.      Eyes: Conjunctivae are normal. Pupils equal and round.       Ears:         Nose: No congestion/rhinnorhea.   Mouth/Throat: Mucous membranes are moist.   Neck: No stridor.  No posterior step-offs.  She does have some midline as well as bilateral neck pain on palpation.  Placed in a  collar. Cardiovascular/Chest: Deformity over the right collarbone, unclear if this is old based on her history, it is tender there especially with movement as well as palpation.  She has tenderness palpation over the right rib margin.  Normal rate, regular rhythm.  No murmurs, rubs, or gallops. Respiratory: Normal respiratory effort without tachypnea nor retractions. Breath sounds are clear and equal bilaterally. No wheezes/rales/rhonchi. Gastrointestinal: Soft. No distention, no guarding, no rebound. Nontender.    Genitourinary/rectal:Deferred Musculoskeletal: Stable and hips are nontender to palpation and range of motion.   Neurologic:  Normal speech and language. No gross or focal neurologic deficits are appreciated. Skin: Several areas of skin tear to the bilateral upper extremities. Psychiatric: Rambling speech, little agitated.   ____________________________________________  LABS (pertinent positives/negatives) I, Lisa Roca, MD the attending physician have reviewed the labs noted below.  Labs Reviewed  BASIC METABOLIC PANEL - Abnormal; Notable for the following components:      Result Value   Chloride 99 (*)    BUN 55 (*)    Creatinine, Ser 4.00 (*)    Calcium 8.8 (*)    GFR calc non Af Amer 9 (*)    GFR calc Af Amer 11 (*)    All other components within normal limits  CBC WITH DIFFERENTIAL/PLATELET - Abnormal; Notable for the following components:   RDW 14.9 (*)    Lymphs Abs 0.9 (*)    All other components within normal limits  TROPONIN I - Abnormal; Notable for the following components:   Troponin I 0.08 (*)    All other components within normal limits  TROPONIN I - Abnormal; Notable for the following components:   Troponin I 0.09 (*)    All other components within normal limits  CK  URINALYSIS, COMPLETE (UACMP) WITH MICROSCOPIC    ____________________________________________    EKG I, Lisa Roca, MD, the attending physician have personally viewed and  interpreted all ECGs.  62 bpm.  Ventricularly paced rhythm. ____________________________________________  RADIOLOGY   Right clavicle x-ray reviewed by me: Fracture Radiologist interpretation:  IMPRESSION: Acute displaced diaphyseal fracture right clavicle as described above. The patient has undergone prior plate and screw fixation of the clavicle. The fracture is at the level of the most medial screw.  Right ribs viewed by me, right clavicle fracture, no rib fractures Radiologist interpretation:  IMPRESSION: Fracture through the mid right clavicle adjacent to the proximal screw from prior internal fixation. The distal fragment is displaced superiorly 1 shaft width.  Moderate to large right pleural effusion. Diffuse right lung airspace disease.  Cardiomegaly.  CT head, maxillofacial and cspine without contrast, radiologist interpretation: IMPRESSION: 1. Probable scalp edema overlying the upper right parietal bone. No underlying skull fracture. 2. No acute intracranial abnormality. No intracranial mass, hemorrhage or edema. Atrophy and chronic small vessel ischemic changes in the white matter. 3. No facial bone fracture or acute displacement. 4. No fracture or acute subluxation in the cervical spine. Degenerative changes of the cervical spine, as detailed above. 5. Fluid level within the right maxillary sinus, suggesting acute sinusitis. 6. Prominent bilateral carotid atherosclerosis.  __________________________________________  PROCEDURES  Procedure(s) performed: None  Procedures  Critical Care performed: None   ____________________________________________  ED COURSE / ASSESSMENT AND PLAN  Pertinent labs & imaging results that were available during my care of the patient were reviewed by me and considered in my medical decision making (see chart for details).   Unclear what patient's mental status baseline is, but she is rambling and a poor historian.  In  any case sound like she did have an unwitnessed fall overnight and is complaining of neck pain, right clavicle pain and right-sided rib pain.  She  also has some facial bruising and clearly struck her head.  From a traumatic standpoint, there is no additional back pain or abdominal pain or extremity pain.  X-ray shows an additional new fracture of the clavicle unfortunately.  Spoke with Dr. Roland Rack.  CT head maxillofacial and cervical spine are reassuring for no significant traumatic injury.  C-collar was removed.  Patient states that that she thinks that she fell due to vertigo and she does have a history documented for Mnire's disease/vertigo.  I am doing a general medical evaluation to make sure there is no other reason for acute fall.  She is a dialysis patient.  She has chronic renal failure without elevated potassium today.  Troponin was 0.08, looks like most previously is been minimally elevated at 0.10 and 0.12.  I am going to repeat that at about 3 hours, around 1340.  Discussed with patient's orthopedist, the fracture at the site of the last screw was known from a week or so ago, however it is now displaced, however Dr. Roland Rack looked at the images today and recommends sling -no acute intervention, follow-up in the office.  Repeat troponin 0 0.09.  Discussed with Dr. Nehemiah Massed, patient's cardiologist, no significant delta and not having symptoms related to this.  Patient will follow-up as an outpatient.     CONSULTATIONS:  Dr. Roland Rack - recommends sling and follow up in office.  Dr. Nehemiah Massed, recommends outpatient follow up - no significant delta with troponin and right sided chest wall pain more consistent with fall/contusion than acs.   Patient / Family / Caregiver informed of clinical course, medical decision-making process, and agree with plan.   I discussed return precautions, follow-up instructions, and discharge instructions with patient and/or family.  Discharge  Instructions : You are evaluated after fall which I do suspect is likely from the vertigo.  Your clavicle fracture is a little bit more displaced than previous, and after discussion with the orthopedic doctor, who would like you in a sling and to follow-up in the office.  We discussed your heart enzyme was slightly elevated, but I suspect this is due to your kidney disease and not any ongoing heart issues.  Please make an appointment with Dr. Nehemiah Massed.  Return to the emergency room immediately for any worsening condition including any new or worsening or uncontrolled pain, confusion or altered mental status, numbness, tingling, fever, or any other symptoms concerning to you.    ___________________________________________   FINAL CLINICAL IMPRESSION(S) / ED DIAGNOSES   Final diagnoses:  Traumatic ecchymosis of face, initial encounter  Multiple skin tears  Unwitnessed fall  Closed displaced fracture of right clavicle, unspecified part of clavicle, initial encounter      ___________________________________________        Note: This dictation was prepared with Dragon dictation. Any transcriptional errors that result from this process are unintentional    Lisa Roca, MD 04/21/17 1523

## 2017-04-21 NOTE — ED Notes (Signed)
Pt asking about location of her cell phone. This RN called and spoke with x-ray department who states that her phone is not in x-ray. Spoke with EMS unit that brought pt in who states that they left her cell phone in her room at [facility].

## 2017-04-21 NOTE — Discharge Instructions (Addendum)
You are evaluated after fall which I do suspect is likely from the vertigo.  Your clavicle fracture is a little bit more displaced than previous, and after discussion with the orthopedic doctor, who would like you in a sling and to follow-up in the office.  We discussed your heart enzyme was slightly elevated, but I suspect this is due to your kidney disease and not any ongoing heart issues.  Please make an appointment with Dr. Nehemiah Massed.  Return to the emergency room immediately for any worsening condition including any new or worsening or uncontrolled pain, confusion or altered mental status, numbness, tingling, fever, or any other symptoms concerning to you.

## 2017-05-19 ENCOUNTER — Encounter (INDEPENDENT_AMBULATORY_CARE_PROVIDER_SITE_OTHER): Payer: Self-pay

## 2017-05-24 ENCOUNTER — Encounter: Payer: Self-pay | Admitting: Emergency Medicine

## 2017-05-24 ENCOUNTER — Other Ambulatory Visit: Payer: Self-pay

## 2017-05-24 ENCOUNTER — Inpatient Hospital Stay
Admission: EM | Admit: 2017-05-24 | Discharge: 2017-06-01 | DRG: 193 | Disposition: A | Discharge status: Skilled Nursing Facility | Payer: Medicare Other | Source: Skilled Nursing Facility | Attending: Internal Medicine | Admitting: Internal Medicine

## 2017-05-24 ENCOUNTER — Emergency Department: Payer: Medicare Other

## 2017-05-24 ENCOUNTER — Other Ambulatory Visit (INDEPENDENT_AMBULATORY_CARE_PROVIDER_SITE_OTHER): Payer: Self-pay | Admitting: Vascular Surgery

## 2017-05-24 DIAGNOSIS — I132 Hypertensive heart and chronic kidney disease with heart failure and with stage 5 chronic kidney disease, or end stage renal disease: Secondary | ICD-10-CM | POA: Diagnosis present

## 2017-05-24 DIAGNOSIS — F329 Major depressive disorder, single episode, unspecified: Secondary | ICD-10-CM | POA: Diagnosis present

## 2017-05-24 DIAGNOSIS — J90 Pleural effusion, not elsewhere classified: Secondary | ICD-10-CM | POA: Diagnosis not present

## 2017-05-24 DIAGNOSIS — G4733 Obstructive sleep apnea (adult) (pediatric): Secondary | ICD-10-CM | POA: Diagnosis present

## 2017-05-24 DIAGNOSIS — Z515 Encounter for palliative care: Secondary | ICD-10-CM | POA: Diagnosis not present

## 2017-05-24 DIAGNOSIS — Z9842 Cataract extraction status, left eye: Secondary | ICD-10-CM

## 2017-05-24 DIAGNOSIS — I739 Peripheral vascular disease, unspecified: Secondary | ICD-10-CM | POA: Diagnosis present

## 2017-05-24 DIAGNOSIS — Z96652 Presence of left artificial knee joint: Secondary | ICD-10-CM | POA: Diagnosis present

## 2017-05-24 DIAGNOSIS — Z885 Allergy status to narcotic agent status: Secondary | ICD-10-CM

## 2017-05-24 DIAGNOSIS — N186 End stage renal disease: Secondary | ICD-10-CM | POA: Diagnosis not present

## 2017-05-24 DIAGNOSIS — H8109 Meniere's disease, unspecified ear: Secondary | ICD-10-CM | POA: Diagnosis present

## 2017-05-24 DIAGNOSIS — J189 Pneumonia, unspecified organism: Secondary | ICD-10-CM

## 2017-05-24 DIAGNOSIS — Z7189 Other specified counseling: Secondary | ICD-10-CM

## 2017-05-24 DIAGNOSIS — R011 Cardiac murmur, unspecified: Secondary | ICD-10-CM | POA: Diagnosis present

## 2017-05-24 DIAGNOSIS — Z681 Body mass index (BMI) 19 or less, adult: Secondary | ICD-10-CM | POA: Diagnosis not present

## 2017-05-24 DIAGNOSIS — J918 Pleural effusion in other conditions classified elsewhere: Secondary | ICD-10-CM | POA: Diagnosis present

## 2017-05-24 DIAGNOSIS — Z888 Allergy status to other drugs, medicaments and biological substances status: Secondary | ICD-10-CM

## 2017-05-24 DIAGNOSIS — A0472 Enterocolitis due to Clostridium difficile, not specified as recurrent: Secondary | ICD-10-CM

## 2017-05-24 DIAGNOSIS — D631 Anemia in chronic kidney disease: Secondary | ICD-10-CM | POA: Diagnosis present

## 2017-05-24 DIAGNOSIS — Y95 Nosocomial condition: Secondary | ICD-10-CM | POA: Diagnosis present

## 2017-05-24 DIAGNOSIS — Z9889 Other specified postprocedural states: Secondary | ICD-10-CM

## 2017-05-24 DIAGNOSIS — Z801 Family history of malignant neoplasm of trachea, bronchus and lung: Secondary | ICD-10-CM

## 2017-05-24 DIAGNOSIS — E43 Unspecified severe protein-calorie malnutrition: Secondary | ICD-10-CM

## 2017-05-24 DIAGNOSIS — Z818 Family history of other mental and behavioral disorders: Secondary | ICD-10-CM

## 2017-05-24 DIAGNOSIS — I5023 Acute on chronic systolic (congestive) heart failure: Secondary | ICD-10-CM | POA: Diagnosis present

## 2017-05-24 DIAGNOSIS — R296 Repeated falls: Secondary | ICD-10-CM | POA: Diagnosis present

## 2017-05-24 DIAGNOSIS — Z90710 Acquired absence of both cervix and uterus: Secondary | ICD-10-CM

## 2017-05-24 DIAGNOSIS — G9341 Metabolic encephalopathy: Secondary | ICD-10-CM | POA: Diagnosis present

## 2017-05-24 DIAGNOSIS — Z9049 Acquired absence of other specified parts of digestive tract: Secondary | ICD-10-CM

## 2017-05-24 DIAGNOSIS — I252 Old myocardial infarction: Secondary | ICD-10-CM | POA: Diagnosis not present

## 2017-05-24 DIAGNOSIS — Z950 Presence of cardiac pacemaker: Secondary | ICD-10-CM

## 2017-05-24 DIAGNOSIS — Z79890 Hormone replacement therapy (postmenopausal): Secondary | ICD-10-CM

## 2017-05-24 DIAGNOSIS — Z87891 Personal history of nicotine dependence: Secondary | ICD-10-CM

## 2017-05-24 DIAGNOSIS — Z66 Do not resuscitate: Secondary | ICD-10-CM | POA: Diagnosis present

## 2017-05-24 DIAGNOSIS — Z9181 History of falling: Secondary | ICD-10-CM

## 2017-05-24 DIAGNOSIS — Z8781 Personal history of (healed) traumatic fracture: Secondary | ICD-10-CM

## 2017-05-24 DIAGNOSIS — Z79891 Long term (current) use of opiate analgesic: Secondary | ICD-10-CM

## 2017-05-24 DIAGNOSIS — I2510 Atherosclerotic heart disease of native coronary artery without angina pectoris: Secondary | ICD-10-CM | POA: Diagnosis present

## 2017-05-24 DIAGNOSIS — Z833 Family history of diabetes mellitus: Secondary | ICD-10-CM

## 2017-05-24 DIAGNOSIS — Z808 Family history of malignant neoplasm of other organs or systems: Secondary | ICD-10-CM

## 2017-05-24 DIAGNOSIS — J181 Lobar pneumonia, unspecified organism: Secondary | ICD-10-CM | POA: Diagnosis present

## 2017-05-24 DIAGNOSIS — T82868A Thrombosis of vascular prosthetic devices, implants and grafts, initial encounter: Secondary | ICD-10-CM | POA: Diagnosis not present

## 2017-05-24 DIAGNOSIS — Z7401 Bed confinement status: Secondary | ICD-10-CM

## 2017-05-24 DIAGNOSIS — N2581 Secondary hyperparathyroidism of renal origin: Secondary | ICD-10-CM | POA: Diagnosis present

## 2017-05-24 DIAGNOSIS — Z79899 Other long term (current) drug therapy: Secondary | ICD-10-CM

## 2017-05-24 DIAGNOSIS — K582 Mixed irritable bowel syndrome: Secondary | ICD-10-CM | POA: Diagnosis present

## 2017-05-24 DIAGNOSIS — R4182 Altered mental status, unspecified: Secondary | ICD-10-CM | POA: Diagnosis present

## 2017-05-24 DIAGNOSIS — Z9841 Cataract extraction status, right eye: Secondary | ICD-10-CM

## 2017-05-24 DIAGNOSIS — E039 Hypothyroidism, unspecified: Secondary | ICD-10-CM | POA: Diagnosis present

## 2017-05-24 DIAGNOSIS — Z992 Dependence on renal dialysis: Secondary | ICD-10-CM

## 2017-05-24 DIAGNOSIS — Z825 Family history of asthma and other chronic lower respiratory diseases: Secondary | ICD-10-CM

## 2017-05-24 DIAGNOSIS — Z85828 Personal history of other malignant neoplasm of skin: Secondary | ICD-10-CM

## 2017-05-24 LAB — CBC WITH DIFFERENTIAL/PLATELET
BASOS PCT: 1 %
Basophils Absolute: 0 10*3/uL (ref 0–0.1)
EOS ABS: 0.1 10*3/uL (ref 0–0.7)
Eosinophils Relative: 1 %
HCT: 39.1 % (ref 35.0–47.0)
HEMOGLOBIN: 13 g/dL (ref 12.0–16.0)
Lymphocytes Relative: 15 %
Lymphs Abs: 1 10*3/uL (ref 1.0–3.6)
MCH: 30 pg (ref 26.0–34.0)
MCHC: 33.2 g/dL (ref 32.0–36.0)
MCV: 90.1 fL (ref 80.0–100.0)
MONOS PCT: 9 %
Monocytes Absolute: 0.6 10*3/uL (ref 0.2–0.9)
NEUTROS PCT: 74 %
Neutro Abs: 5 10*3/uL (ref 1.4–6.5)
Platelets: 203 10*3/uL (ref 150–440)
RBC: 4.34 MIL/uL (ref 3.80–5.20)
RDW: 15.6 % — ABNORMAL HIGH (ref 11.5–14.5)
WBC: 6.7 10*3/uL (ref 3.6–11.0)

## 2017-05-24 LAB — COMPREHENSIVE METABOLIC PANEL WITH GFR
ALT: 20 U/L (ref 14–54)
AST: 39 U/L (ref 15–41)
Albumin: 3.5 g/dL (ref 3.5–5.0)
Alkaline Phosphatase: 132 U/L — ABNORMAL HIGH (ref 38–126)
Anion gap: 13 (ref 5–15)
BUN: 19 mg/dL (ref 6–20)
CO2: 27 mmol/L (ref 22–32)
Calcium: 8.9 mg/dL (ref 8.9–10.3)
Chloride: 94 mmol/L — ABNORMAL LOW (ref 101–111)
Creatinine, Ser: 2.44 mg/dL — ABNORMAL HIGH (ref 0.44–1.00)
GFR calc Af Amer: 19 mL/min — ABNORMAL LOW
GFR calc non Af Amer: 17 mL/min — ABNORMAL LOW
Glucose, Bld: 74 mg/dL (ref 65–99)
Potassium: 3.8 mmol/L (ref 3.5–5.1)
Sodium: 134 mmol/L — ABNORMAL LOW (ref 135–145)
Total Bilirubin: 1.7 mg/dL — ABNORMAL HIGH (ref 0.3–1.2)
Total Protein: 7.3 g/dL (ref 6.5–8.1)

## 2017-05-24 LAB — LACTIC ACID, PLASMA
Lactic Acid, Venous: 1.5 mmol/L (ref 0.5–1.9)
Lactic Acid, Venous: 1.4 mmol/L (ref 0.5–1.9)

## 2017-05-24 LAB — TROPONIN I: Troponin I: 0.12 ng/mL

## 2017-05-24 MED ORDER — ACETAMINOPHEN 325 MG PO TABS
650.0000 mg | ORAL_TABLET | ORAL | Status: DC | PRN
  Administered 2017-05-25 – 2017-05-27 (×4): 650 mg via ORAL
  Filled 2017-05-24 (×4): qty 2

## 2017-05-24 MED ORDER — METOCLOPRAMIDE HCL 5 MG PO TABS: 5.0000 mg | ORAL_TABLET | Freq: Three times a day (TID) | ORAL | Status: DC | PRN

## 2017-05-24 MED ORDER — VANCOMYCIN HCL 500 MG IV SOLR
500.0000 mg | INTRAVENOUS | Status: DC
  Administered 2017-05-26: 500 mg via INTRAVENOUS
  Filled 2017-05-24 (×2): qty 500

## 2017-05-24 MED ORDER — LEVOFLOXACIN IN D5W 750 MG/150ML IV SOLN
750.0000 mg | Freq: Once | INTRAVENOUS | Status: AC
  Administered 2017-05-24: 750 mg via INTRAVENOUS
  Filled 2017-05-24: qty 150

## 2017-05-24 MED ORDER — HEPARIN SODIUM (PORCINE) 5000 UNIT/ML IJ SOLN
5000.0000 [IU] | Freq: Three times a day (TID) | INTRAMUSCULAR | Status: DC
  Administered 2017-05-25 – 2017-06-01 (×20): 5000 [IU] via SUBCUTANEOUS
  Filled 2017-05-24 (×20): qty 1

## 2017-05-24 MED ORDER — LEVOTHYROXINE SODIUM 50 MCG PO TABS
25.0000 ug | ORAL_TABLET | Freq: Every day | ORAL | Status: DC
  Administered 2017-05-25 – 2017-06-01 (×8): 25 ug via ORAL
  Filled 2017-05-24 (×8): qty 1

## 2017-05-24 MED ORDER — RAMIPRIL 2.5 MG PO CAPS
2.5000 mg | ORAL_CAPSULE | Freq: Every day | ORAL | Status: DC
  Administered 2017-05-25 – 2017-05-31 (×8): 2.5 mg via ORAL
  Filled 2017-05-24 (×9): qty 1

## 2017-05-24 MED ORDER — SODIUM CHLORIDE 0.9 % IV SOLN
1.0000 g | INTRAVENOUS | Status: DC
  Administered 2017-05-25 – 2017-05-27 (×4): 1 g via INTRAVENOUS
  Filled 2017-05-24 (×5): qty 1

## 2017-05-24 MED ORDER — DOCUSATE SODIUM 100 MG PO CAPS
100.0000 mg | ORAL_CAPSULE | Freq: Two times a day (BID) | ORAL | Status: DC | PRN
  Administered 2017-05-29 – 2017-05-30 (×2): 100 mg via ORAL
  Filled 2017-05-24 (×2): qty 1

## 2017-05-24 MED ORDER — SERTRALINE HCL 50 MG PO TABS
150.0000 mg | ORAL_TABLET | Freq: Every day | ORAL | Status: DC
  Administered 2017-05-25 – 2017-05-31 (×8): 150 mg via ORAL
  Filled 2017-05-24 (×8): qty 3

## 2017-05-24 MED ORDER — DIALYVITE 3000 3 MG PO TABS: 1.0000 | ORAL_TABLET | Freq: Every day | ORAL | Status: DC

## 2017-05-24 MED ORDER — B COMPLEX-C PO TABS
1.0000 | ORAL_TABLET | Freq: Every day | ORAL | Status: DC
  Administered 2017-05-25 – 2017-06-01 (×7): 1 via ORAL
  Filled 2017-05-24 (×8): qty 1

## 2017-05-24 MED ORDER — GUAIFENESIN-DM 100-10 MG/5ML PO SYRP
5.0000 mL | ORAL_SOLUTION | ORAL | Status: DC | PRN
Start: 2017-05-24 — End: 2017-06-01
  Administered 2017-05-25 – 2017-05-28 (×6): 5 mL via ORAL
  Filled 2017-05-24 (×6): qty 5

## 2017-05-24 MED ORDER — RENA-VITE PO TABS
1.0000 | ORAL_TABLET | Freq: Every day | ORAL | Status: DC
  Administered 2017-05-25 – 2017-06-01 (×7): 1 via ORAL
  Filled 2017-05-24 (×7): qty 1

## 2017-05-24 MED ORDER — POLYETHYLENE GLYCOL 3350 17 G PO PACK: 17.0000 g | PACK | Freq: Every day | ORAL | Status: DC | PRN

## 2017-05-24 MED ORDER — B COMPLEX-C-FOLIC ACID PO TABS: ORAL_TABLET | Freq: Every day | ORAL | Status: DC

## 2017-05-24 MED ORDER — METOPROLOL SUCCINATE ER 25 MG PO TB24
25.0000 mg | ORAL_TABLET | Freq: Every day | ORAL | Status: DC
  Administered 2017-05-25 – 2017-06-01 (×6): 25 mg via ORAL
  Filled 2017-05-24 (×6): qty 1

## 2017-05-24 MED ORDER — VANCOMYCIN HCL IN DEXTROSE 1-5 GM/200ML-% IV SOLN
1000.0000 mg | Freq: Once | INTRAVENOUS | Status: AC
  Administered 2017-05-25: 1000 mg via INTRAVENOUS
  Filled 2017-05-24: qty 200

## 2017-05-24 NOTE — H&P (Signed)
[facility] [name]    MR#:  [id number]  DATE OF BIRTH:  November 08, 1926  DATE OF ADMISSION:  05/24/2017  PRIMARY CARE PHYSICIAN: Kirk Ruths, MD   REQUESTING/REFERRING PHYSICIAN: [email or web address]  CHIEF COMPLAINT:   Chief Complaint  Patient presents with  . Altered Mental Status    HISTORY OF PRESENT ILLNESS: Sharon [name]  is a 84 y.o. female with a known history of nemia, coronary artery disease, skin cancer, cardiomyopathy, CHF with ejection fraction 20%, irritable bowel syndrome, myocardial infarction, obstructive sleep apnea, end-stage renal disease on hemodialysis- lives in [place] for last 1-1/2 years since she had hip surgery and she is mostly bedbound. Today she was sent to hemodialysis center, where she was very lethargic and short of breath so they called EMS and send her to emergency room. She was noted to have moderate to large right-sided pleural effusion with pneumonia and ER physician started on antibiotic and given to hospitalist team for further management. Patient is confused so she is not able to give much details.  PAST MEDICAL HISTORY:   Past Medical History:  Diagnosis Date  . Anemia   . Arthritis   . CAD (coronary artery disease)   . Cancer ([place])    skin  . Cardiomyopathy ([place])   . CHF (congestive heart failure) ([place])   . Depression   . IBS (irritable bowel syndrome) 2010  . Kidney failure July 2012   Hemodialysis 3xweek  . Kidney failure   . Meniere disease   . Meniere's disease   . Myocardial infarction ([place])   . OSA (obstructive sleep apnea)    CPAP  . Peritoneal dialysis status ([place])   . Presence of IVC filter 2019  . Presence of permanent cardiac pacemaker   . Renal insufficiency     PAST SURGICAL HISTORY:  Past Surgical History:  Procedure Laterality Date  . [place]  . ABDOMINAL HYSTERECTOMY    . ANUS SURGERY  2010  . AV FISTULA PLACEMENT  Right 04/15/2016   Procedure: ARTERIOVENOUS (AV) FISTULA CREATION ( RADIOCEPHALIC ) STAGE 2;  Surgeon: Algernon Huxley, MD;  Location: [facility] ORS;  Service: Vascular;  Laterality: Right;  . CATARACT EXTRACTION  2006, 2011  . CHOLECYSTECTOMY  01/2008  . DIALYSIS/PERMA CATHETER INSERTION N/A 01/07/2017   Procedure: DIALYSIS/PERMA CATHETER INSERTION With an IVC filter placement;  Surgeon: Algernon Huxley, MD;  Location: [facility];  Service: Cardiovascular;  Laterality: N/A;  . DIALYSIS/PERMA CATHETER REMOVAL N/A 08/20/2016   Procedure: Dialysis/Perma Catheter Removal;  Surgeon: Algernon Huxley, MD;  Location: [place] [facility];  Service: Cardiovascular;  Laterality: N/A;  . EYE SURGERY     cataracts bilateral  . FEMUR IM NAIL Left 12/15/2015   Procedure: INTRAMEDULLARY (IM) RETROGRADE FEMORAL NAILING;  Surgeon: Oletta Cohn, DO;  Location: [facility];  Service: Orthopedics;  Laterality: Left;  . HIP PINNING,CANNULATED Left 05/10/2016   Procedure: CANNULATED HIP PINNING;  Surgeon: Earnestine Leys, MD;  Location: [facility];  Service: Orthopedics;  Laterality: Left;  . INSERT / REPLACE / REMOVE PACEMAKER    . INSERTION OF DIALYSIS CATHETER  07/2010  . IVC FILTER INSERTION  2019  . JOINT REPLACEMENT     left knee  . MINOR REMOVAL OF PERITONEAL DIALYSIS CATHETER  04/15/2016   Procedure: MINOR REMOVAL OF PERITONEAL DIALYSIS CATHETER;  Surgeon: Algernon Huxley, MD;  Location: [facility];  Service: Vascular;;  .  ORIF CLAVICULAR FRACTURE Right 02/16/2017   Procedure: OPEN REDUCTION INTERNAL FIXATION (ORIF) CLAVICULAR FRACTURE;  Surgeon: Corky Mull, MD;  Location: [facility];  Service: Orthopedics;  Laterality: Right;  . PACEMAKER INSERTION  2006  . PERIPHERAL VASCULAR CATHETERIZATION N/A 12/18/2015   Procedure: Dialysis/Perma Catheter Insertion;  Surgeon: Katha Cabal, MD;  Location: [place] [facility];  Service: Cardiovascular;  Laterality: N/A;  . TOTAL KNEE ARTHROPLASTY  2008   LEFT/Dr [place]    SOCIAL  HISTORY:  Social History   Tobacco Use  . Smoking status: Former Smoker    Last attempt to quit: 03/31/1948    Years since quitting: 69.1  . Smokeless tobacco: Never Used  Substance Use Topics  . Alcohol use: No    FAMILY HISTORY:  Family History  Problem Relation Age of Onset  . Cancer Mother        ? ovarian - sarcoma  . Cancer Father        Skin cancer  . Diabetes Sister   . COPD Sister   . Depression Sister   . Cancer Sister        Lung - 58 yrs old    DRUG ALLERGIES:  Allergies  Allergen Reactions  . Statins Other (See Comments)    Muscle weakness severe  . Codeine Sulfate Nausea And Vomiting  . Codeine Other (See Comments)    GI UPSET  . Effexor [Venlafaxine] Nausea Only  . Ezetimibe-Simvastatin Other (See Comments)    Muscle weakness    REVIEW OF SYSTEMS:   CONSTITUTIONAL: No fever, have fatigue or weakness.  EYES: No blurred or double vision.  EARS, NOSE, AND THROAT: No tinnitus or ear pain.  RESPIRATORY: have cough, shortness of breath, no wheezing or hemoptysis.  CARDIOVASCULAR: No chest pain, orthopnea, edema.  GASTROINTESTINAL: No nausea, vomiting, diarrhea or abdominal pain.  GENITOURINARY: No dysuria, hematuria.  ENDOCRINE: No polyuria, nocturia,  HEMATOLOGY: No anemia, easy bruising or bleeding SKIN: No rash or lesion. MUSCULOSKELETAL: No joint pain or arthritis.   NEUROLOGIC: No tingling, numbness, weakness.  PSYCHIATRY: No anxiety or depression.   MEDICATIONS AT HOME:  Prior to Admission medications   Medication Sig Start Date End Date Taking? Authorizing Provider  acetaminophen (TYLENOL) 325 MG tablet Take 2 tablets (650 mg total) by mouth every 4 (four) hours as needed for mild pain or moderate pain. 05/14/16  Yes Wieting, Richard, MD  B COMPLEX-C-FOLIC ACID PO Take 1 tablet by mouth daily.   Yes [provider]  diphenoxylate-atropine (LOMOTIL) 2.5-0.025 MG tablet Take 1 tablet by mouth every 6 (six) hours as needed for diarrhea or  loose stools.    Yes [provider]  folic acid-vitamin b complex-vitamin c-selenium-zinc (DIALYVITE) 3 MG TABS tablet Take 1 tablet by mouth daily.   Yes [provider]  levothyroxine (SYNTHROID, LEVOTHROID) 25 MCG tablet Take 1 tablet (25 mcg total) by mouth daily. 02/04/17  Yes Mody, Ulice Bold, MD  metoCLOPramide (REGLAN) 5 MG tablet Take 1 tablet (5 mg total) by mouth every 8 (eight) hours as needed (vertigo). 05/30/16  Yes Menshew, Dannielle Karvonen, PA-C  metoprolol succinate (TOPROL-XL) 25 MG 24 hr tablet Take 1 tablet by mouth daily. 04/28/17  Yes [provider]  polyethylene glycol (MIRALAX / GLYCOLAX) packet Take 17 g by mouth daily as needed for moderate constipation.   Yes [provider]  ramipril (ALTACE) 2.5 MG capsule Take 1 capsule (2.5 mg total) by mouth at bedtime. 01/08/17  Yes Loletha Grayer, MD  sertraline (ZOLOFT) 100 MG tablet Take 150 mg by mouth at bedtime.    Yes [provider]  traMADol (ULTRAM) 50 MG tablet Take 50 mg by mouth every 6 (six) hours as needed for moderate pain.    Yes [provider]  Vitamin D, Ergocalciferol, (DRISDOL) 50000 units CAPS capsule Take 50,000 Units by mouth every Monday.  04/17/15  Yes [provider]  lidocaine (LIDODERM) 5 % Place 1 patch onto the skin daily. Remove & Discard patch within 12 hours or as directed by MD Patient not taking: Reported on 03/26/2017 02/18/17   Lattie Corns, PA-C  Nutritional Supplements (FEEDING SUPPLEMENT, NEPRO CARB STEADY,) LIQD Take 237 mLs by mouth 2 (two) times daily between meals. Patient not taking: Reported on 03/26/2017 01/08/17   Loletha Grayer, MD      PHYSICAL EXAMINATION:   VITAL SIGNS: Blood pressure (!) 126/55, pulse 67, temperature 98.6 F (37 C), temperature source Oral, resp. rate (!) 21, weight 45 kg (99 lb 3.3 oz), SpO2 93 %.  GENERAL:  84 y.o.-year-old patient lying in the bed with no acute distress.  EYES: Pupils equal,  round, reactive to light and accommodation. No scleral icterus. Extraocular muscles intact.  HEENT: Head atraumatic, normocephalic. Oropharynx and nasopharynx clear.  NECK:  Supple, no jugular venous distention. No thyroid enlargement, no tenderness.  LUNGS: decreased breath sounds bilaterally, no wheezing, have crepitation. No use of accessory muscles of respiration. Left upper chest hemodialysis catheter in place. CARDIOVASCULAR: S1, S2 normal. No murmurs, rubs, or gallops.  ABDOMEN: Soft, nontender, nondistended. Bowel sounds present. No organomegaly or mass.  EXTREMITIES: No pedal edema, cyanosis, or clubbing.  NEUROLOGIC: Cranial nerves II through XII are intact. Muscle strength 3/5 in all extremities. Sensation intact. Gait not checked.  PSYCHIATRIC: The patient is alert and oriented x 2. Very anxious. SKIN: No obvious rash, lesion, or ulcer.   LABORATORY PANEL:   CBC Recent Labs  Lab 05/24/17 1908  WBC 6.7  HGB 13.0  HCT 39.1  PLT 203  MCV 90.1  MCH 30.0  MCHC 33.2  RDW 15.6*  LYMPHSABS 1.0  MONOABS 0.6  EOSABS 0.1  BASOSABS 0.0   ------------------------------------------------------------------------------------------------------------------  Chemistries  Recent Labs  Lab 05/24/17 1908  NA 134*  K 3.8  CL 94*  CO2 27  GLUCOSE 74  BUN 19  CREATININE 2.44*  CALCIUM 8.9  AST 39  ALT 20  ALKPHOS 132*  BILITOT 1.7*   ------------------------------------------------------------------------------------------------------------------ estimated creatinine clearance is 11.3 mL/min (A) (by C-G formula based on SCr of 2.44 mg/dL (H)). ------------------------------------------------------------------------------------------------------------------ No results for input(s): TSH, T4TOTAL, T3FREE, THYROIDAB in the last 72 hours.  Invalid input(s): FREET3   Coagulation profile No results for input(s): INR, PROTIME in the last 168  hours. ------------------------------------------------------------------------------------------------------------------- No results for input(s): DDIMER in the last 72 hours. -------------------------------------------------------------------------------------------------------------------  Cardiac Enzymes Recent Labs  Lab 05/24/17 1908  TROPONINI 0.12*   ------------------------------------------------------------------------------------------------------------------ Invalid input(s): POCBNP  ---------------------------------------------------------------------------------------------------------------  Urinalysis    Component Value Date/Time   COLORURINE YELLOW (A) 02/03/2017 0815   APPEARANCEUR CLEAR (A) 02/03/2017 0815   LABSPEC 1.014 02/03/2017 0815   PHURINE 5.0 02/03/2017 0815   GLUCOSEU NEGATIVE 02/03/2017 0815   HGBUR NEGATIVE 02/03/2017 0815   BILIRUBINUR NEGATIVE 02/03/2017 0815   BILIRUBINUR negative 09/12/2013 1251   KETONESUR NEGATIVE 02/03/2017 0815   PROTEINUR 100 (A) 02/03/2017 0815   UROBILINOGEN 0.2 09/12/2013 1251   NITRITE NEGATIVE 02/03/2017 0815   LEUKOCYTESUR NEGATIVE 02/03/2017 0815     RADIOLOGY: Dg  Chest 2 View  Result Date: 05/24/2017 CLINICAL DATA:  Tan sputum with lethargy EXAM: CHEST - 2 VIEW COMPARISON:  04/21/2017, 04/07/2017, 02/13/2017 FINDINGS: Moderate to large right pleural effusion. Atelectasis or pneumonia at the right middle lobe and right base. Partial atelectasis medial left base. Cardiomegaly with vascular congestion. Aortic atherosclerosis. No pneumothorax. Postsurgical changes of the right clavicle. Spine appears osteopenic with stable mild midthoracic wedging. IMPRESSION: 1. Moderate to large right pleural effusion with atelectasis or pneumonia at the right middle lobe and right base. 2. Cardiomegaly with vascular congestion. Electronically Signed   By: Donavan Foil M.D.   On: 05/24/2017 18:35    EKG: Orders placed or  performed during the hospital encounter of 04/21/17  . ED EKG  . ED EKG  . EKG    IMPRESSION AND PLAN:  *   Altered mental status    This is secondary to pneumonia and pleural effusion, treat underlying cause and monitor in hospital.  * healthcare associated pneumonia   Patient is a nursing home resident, she has pneumonia with moderate to large pleural effusion on the right side.   Will give vancomycin and cefepime.  * end-stage renal disease on hemodialysis   Continue dialysis as per nephrology.   She was scheduled to have word dialysis catheter removed tomorrow as outpatient.   I will call vascular consult to address that issue.  * moderate to large right pleural effusion   Will try to get ultrasound-guided pleural effusion tap, and sent for lab test.  * severe chronic systolic congestive heart failure   Currently no exacerbation symptoms, continue  home medications.  * hypertension   Continue metoprolol, ramipril.  All the records are reviewed and case discussed with ED provider. Management plans discussed with the patient, family and they are in agreement.  CODE STATUS: DNR Code Status History    Date Active Date Inactive Code Status Order ID Comments User Context   02/16/2017 1928 02/17/2017 1949 Full Code [id number]  Corky Mull, MD Inpatient   02/03/2017 1230 02/04/2017 1907 DNR [id number]  Nicholes Mango, MD ED   01/01/2017 2027 01/08/2017 1858 Full Code [id number]  [name], MD ED   05/12/2016 0809 05/14/2016 1439 DNR [id number]  Max Sane, MD Inpatient   05/10/2016 0506 05/12/2016 0809 Full Code [id number]  Saundra Shelling, MD Inpatient   12/15/2015 1515 12/19/2015 0159 Full Code [id number]  Oletta Cohn, DO Inpatient   12/14/2015 1613 12/15/2015 1515 Full Code [id number]  Bettey Costa, MD Inpatient      Spoke to patient's brother who is healthcare power of attorney. TOTAL TIME TAKING CARE OF THIS PATIENT: 50 minutes.    Vaughan Basta M.D on 05/24/2017    Between 7am to 6pm - Pager - [phone]  After 6pm go to [email or web address] - password [email or web address]  Office  [phone]  CC: Primary care physician; Kirk Ruths, MD   Note: This dictation was prepared with Dragon dictation along with smaller phrase technology. Any transcriptional errors that result from this process are unintentional.

## 2017-05-24 NOTE — ED Provider Notes (Addendum)
[facility] Emergency Department Provider Note  Time seen: 7:12 PM  I have reviewed the triage vital signs and the nursing notes.   HISTORY  Chief Complaint Altered Mental Status    HPI Sharon [name] is a 84 y.o. female with a past medical history of arthritis, CHF, MI, end-stage renal disease on hemodialysis, presents to the emergency department for altered mental status.  According to report patient comes from dialysis for increased weakness lethargy and cough.  Here patient is awake alert but does appear somewhat confused, did not know where she was or why she is here.  Was able to tell me her name.  It is unclear what the patient's baseline mental status is.  Patient states she has been feeling very tired recently but has not been sleeping very well.  Denies any chest pain or shortness of breath.  Denies any abdominal pain nausea vomiting or diarrhea.  Does state occasional cough.  No known fever.   Past Medical History:  Diagnosis Date  . Anemia   . Arthritis   . CAD (coronary artery disease)   . Cancer ([place])    skin  . Cardiomyopathy ([place])   . CHF (congestive heart failure) ([place])   . Depression   . IBS (irritable bowel syndrome) 2010  . Kidney failure July 2012   Hemodialysis 3xweek  . Kidney failure   . Meniere disease   . Meniere's disease   . Myocardial infarction ([place])   . OSA (obstructive sleep apnea)    CPAP  . Peritoneal dialysis status ([place])   . Presence of IVC filter 2019  . Presence of permanent cardiac pacemaker   . Renal insufficiency     Patient Active Problem List   Diagnosis Date Noted  . Complication from renal dialysis device 04/01/2017  . Displaced fracture of lateral end of right clavicle, initial encounter for closed fracture 02/16/2017  . FTT (failure to thrive) in adult 02/03/2017  . ESRD on hemodialysis ([place])   . Pleural effusion on right   . Palliative care by specialist   . Pulmonary embolism ([place]) 01/02/2017   . Syncope 01/01/2017  . Vision changes 08/27/2016  . Closed fracture of neck of femur ([place]) 05/26/2016  . Hip fracture ([place]) 05/10/2016  . Falls 04/27/2016  . Head injury 04/27/2016  . Age-related osteoporosis with current pathological fracture with routine healing 04/24/2016  . Recurrent falls 04/24/2016  . Mild protein-calorie malnutrition ([place]) 04/24/2016  . ESRD on dialysis ([place]) 04/10/2016  . Periprosthetic fracture around internal prosthetic left knee joint 01/26/2016  . Pressure injury of skin 12/15/2015  . Closed left subtrochanteric femur fracture ([place]) 12/15/2015  . Femur fracture, left ([place]) 12/14/2015  . Meniere disease   . Loss of weight 10/21/2015  . Clinical depression 06/20/2015  . Dysphagia, unspecified 05/24/2015  . Imbalance 04/22/2015  . Chronic pain syndrome 05/22/2014  . Insomnia 03/13/2014  . Anxiety state 03/13/2014  . Chronic systolic CHF (congestive heart failure), NYHA class 3 ([place]) 01/15/2014  . OSA (obstructive sleep apnea) 01/15/2014  . Basal cell carcinoma of neck 11/14/2013  . DDD (degenerative disc disease), cervical 11/07/2013  . Cervical radiculitis 10/16/2013  . Benign essential hypertension 09/12/2013  . Memory loss 09/12/2013  . Systolic heart failure, chronic ([place]) 05/12/2013  . Goals of care, counseling/discussion 11/10/2012  . Sinus node dysfunction ([place]) 08/01/2012  . Low back pain 08/01/2012  . Cervical spine pain 05/02/2012  . Irritable bowel syndrome 11/02/2011  . Depression 04/01/2011  .  Hypertension 04/01/2011    Past Surgical History:  Procedure Laterality Date  . [place]  . ABDOMINAL HYSTERECTOMY    . ANUS SURGERY  2010  . AV FISTULA PLACEMENT Right 04/15/2016   Procedure: ARTERIOVENOUS (AV) FISTULA CREATION ( RADIOCEPHALIC ) STAGE 2;  Surgeon: Algernon Huxley, MD;  Location: [facility] ORS;  Service: Vascular;  Laterality: Right;  . CATARACT EXTRACTION  2006, 2011  . CHOLECYSTECTOMY  01/2008  .  DIALYSIS/PERMA CATHETER INSERTION N/A 01/07/2017   Procedure: DIALYSIS/PERMA CATHETER INSERTION With an IVC filter placement;  Surgeon: Algernon Huxley, MD;  Location: [place] [facility] LAB;  Service: Cardiovascular;  Laterality: N/A;  . DIALYSIS/PERMA CATHETER REMOVAL N/A 08/20/2016   Procedure: Dialysis/Perma Catheter Removal;  Surgeon: Algernon Huxley, MD;  Location: [place] CV LAB;  Service: Cardiovascular;  Laterality: N/A;  . EYE SURGERY     cataracts bilateral  . FEMUR IM NAIL Left 12/15/2015   Procedure: INTRAMEDULLARY (IM) RETROGRADE FEMORAL NAILING;  Surgeon: Oletta Cohn, DO;  Location: [facility] ORS;  Service: Orthopedics;  Laterality: Left;  . HIP PINNING,CANNULATED Left 05/10/2016   Procedure: CANNULATED HIP PINNING;  Surgeon: Earnestine Leys, MD;  Location: [facility] ORS;  Service: Orthopedics;  Laterality: Left;  . INSERT / REPLACE / REMOVE PACEMAKER    . INSERTION OF DIALYSIS CATHETER  07/2010  . IVC FILTER INSERTION  2019  . JOINT REPLACEMENT     left knee  . MINOR REMOVAL OF PERITONEAL DIALYSIS CATHETER  04/15/2016   Procedure: MINOR REMOVAL OF PERITONEAL DIALYSIS CATHETER;  Surgeon: Algernon Huxley, MD;  Location: [facility] ORS;  Service: Vascular;;  . ORIF CLAVICULAR FRACTURE Right 02/16/2017   Procedure: OPEN REDUCTION INTERNAL FIXATION (ORIF) CLAVICULAR FRACTURE;  Surgeon: Corky Mull, MD;  Location: [facility] ORS;  Service: Orthopedics;  Laterality: Right;  . PACEMAKER INSERTION  2006  . PERIPHERAL VASCULAR CATHETERIZATION N/A 12/18/2015   Procedure: Dialysis/Perma Catheter Insertion;  Surgeon: Katha Cabal, MD;  Location: [place] [facility] LAB;  Service: Cardiovascular;  Laterality: N/A;  . TOTAL KNEE ARTHROPLASTY  2008   LEFT/Dr [place]    Prior to Admission medications   Medication Sig Start Date End Date Taking? Authorizing Provider  acetaminophen (TYLENOL) 325 MG tablet Take 2 tablets (650 mg total) by mouth every 4 (four) hours as needed for mild pain or moderate pain. 05/14/16    Loletha Grayer, MD  B COMPLEX-C-FOLIC ACID PO Take 1 tablet by mouth daily.    [provider]  diphenoxylate-atropine (LOMOTIL) 2.5-0.025 MG tablet Take 1 tablet by mouth every 6 (six) hours as needed for diarrhea or loose stools.     [provider]  levothyroxine (SYNTHROID, LEVOTHROID) 25 MCG tablet Take 1 tablet (25 mcg total) by mouth daily. 02/04/17   Bettey Costa, MD  lidocaine (LIDODERM) 5 % Place 1 patch onto the skin daily. Remove & Discard patch within 12 hours or as directed by MD Patient not taking: Reported on 03/26/2017 02/18/17   Lattie Corns, PA-C  metoCLOPramide (REGLAN) 5 MG tablet Take 1 tablet (5 mg total) by mouth every 8 (eight) hours as needed (vertigo). 05/30/16   Menshew, Dannielle Karvonen, PA-C  metoprolol tartrate (LOPRESSOR) 25 MG tablet Take 25 mg by mouth daily.    [provider]  Nutritional Supplements (FEEDING SUPPLEMENT, NEPRO CARB STEADY,) LIQD Take 237 mLs by mouth 2 (two) times daily between meals. Patient not taking: Reported on 03/26/2017 01/08/17   Loletha Grayer, MD  polyethylene glycol (  MIRALAX / GLYCOLAX) packet Take 17 g by mouth daily as needed for moderate constipation.    [provider]  ramipril (ALTACE) 2.5 MG capsule Take 1 capsule (2.5 mg total) by mouth at bedtime. 01/08/17   Loletha Grayer, MD  sertraline (ZOLOFT) 100 MG tablet Take 150 mg by mouth at bedtime.     [provider]  traMADol (ULTRAM) 50 MG tablet Take 50 mg by mouth every 6 (six) hours as needed for moderate pain.     [provider]  Vitamin D, Ergocalciferol, (DRISDOL) 50000 units CAPS capsule Take 50,000 Units by mouth every Monday.  04/17/15   [provider]    Allergies  Allergen Reactions  . Statins Other (See Comments)    Muscle weakness severe  . Codeine Sulfate Nausea And Vomiting  . Codeine Other (See Comments)    GI UPSET  . Effexor [Venlafaxine] Nausea Only  . Ezetimibe-Simvastatin Other (See  Comments)    Muscle weakness    Family History  Problem Relation Age of Onset  . Cancer Mother        ? ovarian - sarcoma  . Cancer Father        Skin cancer  . Diabetes Sister   . COPD Sister   . Depression Sister   . Cancer Sister        Lung - 41 yrs old    Social History Social History   Tobacco Use  . Smoking status: Former Smoker    Last attempt to quit: 03/31/1948    Years since quitting: 69.1  . Smokeless tobacco: Never Used  Substance Use Topics  . Alcohol use: No  . Drug use: No    Review of Systems Constitutional: Negative for fever. Eyes: Negative for visual complaints ENT: Negative for recent illness/congestion Cardiovascular: Negative for chest pain. Respiratory: Negative for shortness of breath. Gastrointestinal: Negative for abdominal pain, vomiting and diarrhea. Genitourinary: He is on hemodialysis. Musculoskeletal: Negative for musculoskeletal complaints Skin: Negative for skin complaints  Neurological: Negative for headache All other ROS negative, although possibly limited due to confusion/altered mental status.  ____________________________________________   PHYSICAL EXAM:  VITAL SIGNS: ED Triage Vitals  Enc Vitals Group     BP 05/24/17 1739 (!) 155/62     Pulse Rate 05/24/17 1739 68     Resp 05/24/17 1739 (!) 26     Temp 05/24/17 1739 98.6 F (37 C)     Temp Source 05/24/17 1739 Oral     SpO2 05/24/17 1739 96 %     Weight 05/24/17 1737 99 lb 3.3 oz (45 kg)     Height --      Head Circumference --      Peak Flow --      Pain Score 05/24/17 1737 0     Pain Loc --      Pain Edu? --      Excl. in [place]? --    Constitutional: Alert and oriented. Well appearing and in no distress. Eyes: Normal exam ENT   Head: Normocephalic and atraumatic   Mouth/Throat: Somewhat dry appearing mucous membranes. Cardiovascular: Normal rate, regular rhythm. No murmurs, rubs, or gallops. Respiratory: Normal respiratory effort without tachypnea nor  retractions. Breath sounds are clear  Gastrointestinal: Soft and nontender. No distention. Musculoskeletal: Nontender with normal range of motion in all extremities.  Neurologic:  Normal speech and language. No gross focal neurologic deficits Skin:  Skin is warm, dry and intact.  Psychiatric: Mood and affect are normal.  ____________________________________________   RADIOLOGY  Chest x-ray shows moderate to large right pleural effusion with atelectasis possible pneumonia in the right middle lobe as well.  Also cardiomegaly with vascular congestion.  ____________________________________________   INITIAL IMPRESSION / ASSESSMENT AND PLAN / ED COURSE  Pertinent labs & imaging results that were available during my care of the patient were reviewed by me and considered in my medical decision making (see chart for details).  Presents to the emergency department for concerns of confusion with increased weakness/lethargy as well as cough.  Here the patient is awake alert, will answer questions does appear fatigued, falls asleep during evaluation.  Will awaken answer questions but did not know initially where she was a year or why she is here.  Awaiting family arrival for further information.  Will check labs, chest x-ray and continue to closely monitor in the emergency department.  Patient's x-ray shows moderate to large right pleural effusion with possible pneumonia.  We will cover with antibiotics, we will check blood cultures as well as lactic acid in addition to her more basic lab work.  We will start on IV Levaquin while awaiting further results.  The remaining labs show an elevated troponin 0.12 but largely unchanged from baseline.  Lactate is 1.5.  Blood cultures have been sent.  Patient is more confused now, states she has just gotten out of surgery which is not accurate.  We will cover with antibiotics and admit to the hospitalist service for further treatment.  I discussed patient with  her brother who is her listed contact and demographics.  He is agreeable to this plan of care.  The brother is also spoken to the patient as well.  ____________________________________________   FINAL CLINICAL IMPRESSION(S) / ED DIAGNOSES  Altered mental status Weakness Pneumonia    Harvest Dark, MD 05/24/17 2105    Harvest Dark, MD 05/24/17 2201

## 2017-05-24 NOTE — ED Notes (Signed)
Pt has a procedure scheduled at [facility] to have temporary HD cath removed per her brother and POA.  Fall risk bracelet placed and restricted extremity sleeve placed at this time also

## 2017-05-24 NOTE — Progress Notes (Signed)
ANTIBIOTIC CONSULT NOTE - INITIAL  Pharmacy Consult for Vancomycin, Cefepime  Indication: pneumonia  Allergies  Allergen Reactions  . Statins Other (See Comments)    Muscle weakness severe  . Codeine Sulfate Nausea And Vomiting  . Codeine Other (See Comments)    GI UPSET  . Effexor [Venlafaxine] Nausea Only  . Ezetimibe-Simvastatin Other (See Comments)    Muscle weakness    Patient Measurements: Weight: 99 lb 3.3 oz (45 kg) Adjusted Body Weight: 50.82 kg   Vital Signs: Temp: 98.6 F (37 C) (04/29 1739) Temp Source: Oral (04/29 1739) BP: 126/55 (04/29 2000) Pulse Rate: 67 (04/29 1800) Intake/Output from previous day: No intake/output data recorded. Intake/Output from this shift: Total I/O In: 200 [IV Piggyback:200] Out: -   Labs: Recent Labs    05/24/17 1908  WBC 6.7  HGB 13.0  PLT 203  CREATININE 2.44*   Estimated Creatinine Clearance: 11.3 mL/min (A) (by C-G formula based on SCr of 2.44 mg/dL (H)). No results for input(s): VANCOTROUGH, VANCOPEAK, VANCORANDOM, GENTTROUGH, GENTPEAK, GENTRANDOM, TOBRATROUGH, TOBRAPEAK, TOBRARND, AMIKACINPEAK, AMIKACINTROU, AMIKACIN in the last 72 hours.   Microbiology: No results found for this or any previous visit (from the past 720 hour(s)).  Medical History: Past Medical History:  Diagnosis Date  . Anemia   . Arthritis   . CAD (coronary artery disease)   . Cancer ([place])    skin  . Cardiomyopathy ([place])   . CHF (congestive heart failure) ([place])   . Depression   . IBS (irritable bowel syndrome) 2010  . Kidney failure July 2012   Hemodialysis 3xweek  . Kidney failure   . Meniere disease   . Meniere's disease   . Myocardial infarction ([place])   . OSA (obstructive sleep apnea)    CPAP  . Peritoneal dialysis status ([place])   . Presence of IVC filter 2019  . Presence of permanent cardiac pacemaker   . Renal insufficiency     Medications:   (Not in a hospital admission) Assessment: CrCl = 11.3 ml/min  Ke = 0.014  hr-1 T1/2 = 49.5 hrs VD = 31.5 L   Goal of Therapy:  Vancomycin trough level 15-20 mcg/ml  Plan:  Expected duration 7 days with resolution of temperature and/or normalization of WBC   Vancomycin 1000 mg IV X 1 ordered to start on 4/29 @ 23:00. Vancomycin 500 mg IV Q MWF - HD . Will draw 1st VT 30 minutes before 3rd HD session expected to be on 5/6.   Cefepime 1 gm IV Q24H ordered to start on 4/29 @ 23:00.   Yancarlos Berthold D 05/24/2017,10:10 PM

## 2017-05-24 NOTE — ED Notes (Signed)
Patient transported to X-ray 

## 2017-05-24 NOTE — ED Triage Notes (Signed)
Came from dialysis today.  Dialysis RN reported to EMS that over last couple weeks has been getting more lethargic.  Wet cough noted in room. From [facility].

## 2017-05-24 NOTE — Progress Notes (Signed)
Family Meeting Note  Advance Directive:yes  Today a meeting took place with the brother.  Patient is unable to participate due [id number] capacity confused   The following clinical team members were present during this meeting:MD  The following were discussed:Patient's diagnosis:severe systolic congestive heart failure, pneumonia, end-stage renal disease on hemodialysis, pleural effusion , Patient's progosis: Unable to determine and Goals for treatment: DNR  Additional follow-up to be provided: Nephrology, vascular, palliative care.  Time spent during discussion:20 minutes  Vaughan Basta, MD

## 2017-05-24 NOTE — Progress Notes (Signed)
On report from er nurse. States pt c/o losing phone. Er nurse looked all over room and inside all pt belongings and never found a phone. Also states family asked about pt hearing aides and er nurse stated she was on phone with POA and he states she lost the left one some time ago and pt has right hearing aide in.

## 2017-05-24 NOTE — ED Notes (Signed)
Spoke with Santina Evans, brother and POA. He states that he does not want CPR for pt but does want everything else.  He can be called to discuss this [phone]

## 2017-05-25 ENCOUNTER — Inpatient Hospital Stay: Payer: Medicare Other

## 2017-05-25 ENCOUNTER — Ambulatory Visit: Admission: RE | Admit: 2017-05-25 | Payer: Medicare Other | Source: Ambulatory Visit | Admitting: Vascular Surgery

## 2017-05-25 DIAGNOSIS — E43 Unspecified severe protein-calorie malnutrition: Secondary | ICD-10-CM

## 2017-05-25 LAB — BODY FLUID CELL COUNT WITH DIFFERENTIAL
EOS FL: 1 %
Lymphs, Fluid: 24 %
Monocyte-Macrophage-Serous Fluid: 62 %
NEUTROPHIL FLUID: 13 %
OTHER CELLS FL: 0 %
Total Nucleated Cell Count, Fluid: 144 cu mm

## 2017-05-25 LAB — GLUCOSE, PLEURAL OR PERITONEAL FLUID: Glucose, Fluid: 115 mg/dL

## 2017-05-25 LAB — CBC
HEMATOCRIT: 34.3 % — AB (ref 35.0–47.0)
HEMOGLOBIN: 11.5 g/dL — AB (ref 12.0–16.0)
MCH: 30.3 pg (ref 26.0–34.0)
MCHC: 33.6 g/dL (ref 32.0–36.0)
MCV: 90.3 fL (ref 80.0–100.0)
Platelets: 196 10*3/uL (ref 150–440)
RBC: 3.8 MIL/uL (ref 3.80–5.20)
RDW: 15.8 % — ABNORMAL HIGH (ref 11.5–14.5)
WBC: 6.2 10*3/uL (ref 3.6–11.0)

## 2017-05-25 LAB — BASIC METABOLIC PANEL
ANION GAP: 11 (ref 5–15)
BUN: 27 mg/dL — ABNORMAL HIGH (ref 6–20)
CALCIUM: 8.4 mg/dL — AB (ref 8.9–10.3)
CO2: 25 mmol/L (ref 22–32)
Chloride: 97 mmol/L — ABNORMAL LOW (ref 101–111)
Creatinine, Ser: 3.16 mg/dL — ABNORMAL HIGH (ref 0.44–1.00)
GFR calc non Af Amer: 12 mL/min — ABNORMAL LOW (ref >60)
GFR, EST AFRICAN AMERICAN: 14 mL/min — AB (ref >60)
GLUCOSE: 90 mg/dL (ref 65–99)
POTASSIUM: 3.8 mmol/L (ref 3.5–5.1)
Sodium: 133 mmol/L — ABNORMAL LOW (ref 135–145)

## 2017-05-25 LAB — PROTEIN, PLEURAL OR PERITONEAL FLUID: Total protein, fluid: <3 g/dL

## 2017-05-25 LAB — MRSA PCR SCREENING: MRSA by PCR: POSITIVE — AB

## 2017-05-25 LAB — ALBUMIN, PLEURAL OR PERITONEAL FLUID: Albumin, Fluid: 1.6 g/dL

## 2017-05-25 SURGERY — DIALYSIS/PERMA CATHETER REMOVAL
Anesthesia: LOCAL

## 2017-05-25 MED ORDER — PRO-STAT SUGAR FREE PO LIQD
30.0000 mL | Freq: Two times a day (BID) | ORAL | Status: DC
  Administered 2017-05-25 – 2017-06-01 (×13): 30 mL via ORAL

## 2017-05-25 MED ORDER — CHLORHEXIDINE GLUCONATE CLOTH 2 % EX PADS
6.0000 | MEDICATED_PAD | Freq: Every day | CUTANEOUS | Status: AC
  Administered 2017-05-26 – 2017-05-29 (×4): 6 via TOPICAL

## 2017-05-25 MED ORDER — OCUVITE-LUTEIN PO CAPS
1.0000 | ORAL_CAPSULE | Freq: Every day | ORAL | Status: DC
  Administered 2017-05-25: 1 via ORAL
  Filled 2017-05-25 (×2): qty 1

## 2017-05-25 MED ORDER — MUPIROCIN 2 % EX OINT
1.0000 "application " | TOPICAL_OINTMENT | Freq: Two times a day (BID) | CUTANEOUS | Status: AC
  Administered 2017-05-25 – 2017-05-30 (×10): 1 via NASAL
  Filled 2017-05-25: qty 22

## 2017-05-25 MED ORDER — BOOST / RESOURCE BREEZE PO LIQD CUSTOM
1.0000 | Freq: Three times a day (TID) | ORAL | Status: DC
  Administered 2017-05-25 – 2017-06-01 (×18): 1 via ORAL

## 2017-05-25 MED ORDER — OCUVITE-LUTEIN PO CAPS
1.0000 | ORAL_CAPSULE | Freq: Every day | ORAL | Status: DC
  Administered 2017-05-26 – 2017-05-31 (×6): 1 via ORAL
  Filled 2017-05-25 (×7): qty 1

## 2017-05-25 NOTE — Progress Notes (Signed)
CSW met with patient and her brother Santina Evans who is also her POA. CSW asked patient if she could speak with her brother about her care and patient said "yes of course, that is my power of attorney." CSW confirmed with patient's brother that patient will go back to [facility] at discharge. Brother has no other concerns at this time.    Annamaria Boots MSW, [place] [phone]

## 2017-05-25 NOTE — Progress Notes (Signed)
Patient was on the commode at the time of my visit and therefore she was not examined.  I will plan to remove her tunneled dialysis catheter prior to her discharge.  Clearly earlier today her pulmonary condition took precedent.  Catheter removal is a purely elective thing and I would wait until the patient is more comfortable and her pneumonia has been adequately treated.

## 2017-05-25 NOTE — Clinical Social Work Note (Addendum)
Clinical Social Work Assessment  Patient Details  [name] MRN: [id number] Date of Birth: December 05, 1926  Date of referral:  05/25/17               Reason for consult:  Facility Placement                Permission sought to share information with:  Facility Sport and exercise psychologist, Family Supports Permission granted to share information::  Yes, Verbal Permission Granted  Name::      SNF  Agency::   [facility]   Relationship::     Contact Information:     Housing/Transportation Living arrangements for the past 2 months:  [place] of Information:  Patient, Facility Patient Interpreter Needed:  None Criminal Activity/Legal Involvement Pertinent to Current Situation/Hospitalization:  No - Comment as needed Significant Relationships:  Siblings(Sharon [name] ) Lives with:  Facility Resident Do you feel safe going back to the place where you live?  Yes Need for family participation in patient care:  Yes (Comment)  Care giving concerns:  Patient is a long term resident at [facility]    Social Worker assessment / plan:  CSW met with patient to discuss discharge planning. Patient reports that she has been a long term patient at [facility] for the past year and a half. She wants to return to [facility] at discharge. CSW confirmed that patient is a long term resident with Sharon [name] at [facility]. Per Sharon [name], patient can return when ready. During conversation with patient it was evident that patient has some confusion. Patient had current location confused with being in the SNF. She also had some confusion as to the current date and time. Patient also seemed depressed about being in a facility. She stated understanding that she needed the assistance but was sad that she no longer lives in her home. Patient has a POA that assists with her healthcare decisions. CSW attempted to contact patients POA, Sharon [name] [phone] but he was  unavailable.   Employment status:  Retired Forensic scientist:  [employer] PT Recommendations:  [place] / Referral to community resources:  [place]  Patient/Family's Response to care:  Patient is agreeable to returning to [facility] at discharge   Patient/Family's Understanding of and Emotional Response to Diagnosis, Current Treatment, and Prognosis:  Per MD note, patient's brother, Sharon [name] is aware of her prognosis and is in agreement with DC plan to return to [facility].   Emotional Assessment Appearance:  Appears stated age Attitude/Demeanor/Rapport:  Lethargic Affect (typically observed):  Accepting, Depressed Orientation:  Oriented to Self, Oriented to Situation Alcohol / Substance use:  Not Applicable Psych involvement (Current and /or in the community):  No (Comment)  Discharge Needs  Concerns to be addressed:  Discharge Planning Concerns Readmission within the last 30 days:  No Current discharge risk:  None Barriers to Discharge:  No Barriers Identified   Sharon [name] 05/25/2017, 11:54 AM

## 2017-05-25 NOTE — Progress Notes (Signed)
Chaplain responded to an OR for AD education. Patient was resting, but her brother said that he was her 25 and that a DNR was in place.  Chaplain discussed the differences between a DNR and AD.  Chaplain provided education, AD booklet, and offered to return, if needed.

## 2017-05-25 NOTE — Progress Notes (Signed)
Patient states that she would like to change her POA, from her brother to someone else; chaplain consult entered; also stated that Niece, Blima Ledger would be a contact for her, also a very good next door neighbor, Bea Laura. Twilla Khouri K, RNl; 4:11 AM;05/25/2017

## 2017-05-25 NOTE — Progress Notes (Signed)
Patient ID: [name], female   DOB: [birth year], 84 y.o.   MRN: [id number]  [facility] PROGRESS NOTE  ROYAL BEIRNE YTK:[id number] DOB: 05-Aug-1926 DOA: 05/24/2017 PCP: Kirk Ruths, MD  HPI/Subjective: Patient was sent in from dialysis secondary to altered mental status.  Today the patient's mental status has improved.  She complains of diarrhea and not feeling good.  Coughing a lot.  Shortness of breath.  Objective: Vitals:   05/25/17 1355 05/25/17 1440  BP: 128/60 120/60  Pulse: 66 69  Resp: 18 20  Temp:    SpO2: 95% 95%    Filed Weights   05/24/17 1737 05/24/17 2252  Weight: 45 kg (99 lb 3.3 oz) 41.8 kg (92 lb 1.6 oz)    ROS: Review of Systems  Constitutional: Negative for chills and fever.  Eyes: Negative for blurred vision.  Respiratory: Positive for cough and shortness of breath.   Cardiovascular: Negative for chest pain.  Gastrointestinal: Positive for abdominal pain and diarrhea. Negative for constipation, nausea and vomiting.  Genitourinary: Negative for dysuria.  Musculoskeletal: Negative for joint pain.  Neurological: Negative for dizziness and headaches.   Exam: Physical Exam  Constitutional: She is oriented to person, place, and time.  HENT:  Nose: No mucosal edema.  Mouth/Throat: No oropharyngeal exudate or posterior oropharyngeal edema.  Eyes: Pupils are equal, round, and reactive to light. Conjunctivae, EOM and lids are normal.  Neck: No JVD present. Carotid bruit is not present. No edema present. No thyroid mass and no thyromegaly present.  Cardiovascular: S1 normal and S2 normal. Exam reveals no gallop.  Murmur heard.  Systolic murmur is present with a grade of 2/6. Pulses:      Dorsalis pedis pulses are 2+ on the right side, and 2+ on the left side.  Respiratory: No respiratory distress. She has decreased breath sounds in the right middle field, the right lower field and the left lower field. She has no wheezes. She has rhonchi in  the right middle field, the right lower field and the left lower field. She has no rales.  GI: Soft. Bowel sounds are normal. There is no tenderness.  Musculoskeletal:       Right ankle: She exhibits no swelling.       Left ankle: She exhibits no swelling.  Lymphadenopathy:    She has no cervical adenopathy.  Neurological: She is alert and oriented to person, place, and time. No cranial nerve deficit.  Skin: Skin is warm. Nails show no clubbing.  Psychiatric: She has a normal mood and affect.      Data Reviewed: Basic Metabolic Panel: Recent Labs  Lab 05/24/17 1908 05/25/17 0538  NA 134* 133*  K 3.8 3.8  CL 94* 97*  CO2 27 25  GLUCOSE 74 90  BUN 19 27*  CREATININE 2.44* 3.16*  CALCIUM 8.9 8.4*   Liver Function Tests: Recent Labs  Lab 05/24/17 1908  AST 39  ALT 20  ALKPHOS 132*  BILITOT 1.7*  PROT 7.3  ALBUMIN 3.5   CBC: Recent Labs  Lab 05/24/17 1908 05/25/17 0538  WBC 6.7 6.2  NEUTROABS 5.0  --   HGB 13.0 11.5*  HCT 39.1 34.3*  MCV 90.1 90.3  PLT 203 196   Cardiac Enzymes: Recent Labs  Lab 05/24/17 1908  TROPONINI 0.12*     Recent Results (from the past 240 hour(s))  Blood culture (routine x 2)     Status: None (Preliminary result)   Collection Time: 05/24/17  7:08 PM  Result Value Ref Range Status   Specimen Description BLOOD L FA  Final   Special Requests   Final    BOTTLES DRAWN AEROBIC AND ANAEROBIC Blood Culture adequate volume   Culture   Final    NO GROWTH < 24 HOURS Performed at [facility], [address], [place], [place] [address]    Report Status PENDING  Incomplete  Blood culture (routine x 2)     Status: None (Preliminary result)   Collection Time: 05/24/17  7:11 PM  Result Value Ref Range Status   Specimen Description BLOOD BLOOD LEFT WRIST  Final   Special Requests   Final    BOTTLES DRAWN AEROBIC AND ANAEROBIC Blood Culture adequate volume   Culture   Final    NO GROWTH < 24 HOURS Performed at [facility], [address], [place], [place] [address]    Report Status PENDING  Incomplete  MRSA PCR Screening     Status: Abnormal   Collection Time: 05/25/17  5:02 AM  Result Value Ref Range Status   MRSA by PCR POSITIVE (A) NEGATIVE Final    Comment:        The GeneXpert MRSA Assay (FDA approved for NASAL specimens only), is one component of a comprehensive MRSA colonization surveillance program. It is not intended to diagnose MRSA infection nor to guide or monitor treatment for MRSA infections. RESULT CALLED TO, READ BACK BY AND VERIFIED WITH: MARCELLA TURNER AT [place] ON 05/25/2017 JJB Performed at [facility], [address] [place], [place] [address]      Studies: Dg Chest 2 View  Result Date: 05/24/2017 CLINICAL DATA:  Tan sputum with lethargy EXAM: CHEST - 2 VIEW COMPARISON:  04/21/2017, 04/07/2017, 02/13/2017 FINDINGS: Moderate to large right pleural effusion. Atelectasis or pneumonia at the right middle lobe and right base. Partial atelectasis medial left base. Cardiomegaly with vascular congestion. Aortic atherosclerosis. No pneumothorax. Postsurgical changes of the right clavicle. Spine appears osteopenic with stable mild midthoracic wedging. IMPRESSION: 1. Moderate to large right pleural effusion with atelectasis or pneumonia at the right middle lobe and right base. 2. Cardiomegaly with vascular congestion. Electronically Signed   By: Donavan Foil M.D.   On: 05/24/2017 18:35   US Thoracentesis Asp Pleural Space W/img Guide  Result Date: 05/25/2017 CLINICAL DATA:  Right pleural effusion. EXAM: ULTRASOUND GUIDED RIGHT THORACENTESIS COMPARISON:  Chest x-ray on 05/24/2017 PROCEDURE: An ultrasound guided thoracentesis was thoroughly discussed with the patient's brother and questions answered. The benefits, risks, alternatives and complications were also discussed. The patient's brother understands and wishes to proceed with the procedure. Written consent was  obtained. Ultrasound was performed to localize and mark an adequate pocket of fluid in the right chest. The area was then prepped and draped in the normal sterile fashion. 1% Lidocaine was used for local anesthesia. Under ultrasound guidance a 6 French Safe-T-Centesis catheter was introduced. Thoracentesis was performed. The catheter was removed and a dressing applied. COMPLICATIONS: None FINDINGS: A total of approximately 1.4 L of dark, yellow fluid was removed. A fluid sample was sent for laboratory analysis. IMPRESSION: Successful ultrasound guided right thoracentesis yielding 1.4 L of pleural fluid. Electronically Signed   By: Aletta Edouard M.D.   On: 05/25/2017 14:52    Scheduled Meds: . B-complex with vitamin C  1 tablet Oral Daily  . feeding supplement  1 Container Oral TID BM  . feeding supplement (PRO-STAT SUGAR FREE 64)  30 mL Oral BID  . heparin  5,000 Units Subcutaneous Q8H  . levothyroxine  25 mcg Oral QAC breakfast  . metoprolol succinate  25 mg Oral Daily  . multivitamin  1 tablet Oral Daily  . [START ON 05/26/2017] multivitamin-lutein  1 capsule Oral Daily  . ramipril  2.5 mg Oral QHS  . sertraline  150 mg Oral QHS   Continuous Infusions: . ceFEPime (MAXIPIME) IV Stopped (05/25/17 0111)  . [START ON 05/26/2017] vancomycin      Assessment/Plan:  1. Healthcare associated pneumonia started on vancomycin and cefepime 2. Large right pleural effusion.  Patient was seen prior to thoracentesis but note written after thoracentesis drew of 1.4 L.  Awaiting studies. 3. Acute encephalopathy seems to have improved. 4. End-stage renal disease on dialysis as per hemodialysis 5. Acute on chronic systolic congestive heart failure.  Limited with medication secondary to hypotension.  Patient on low-dose metoprolol and ramipril. 6. Hypothyroidism unspecified on levothyroxine 7. Malnutrition on Ensure 8. Depression on Zoloft 9. Diarrhea send off stool studies  Code Status:     Code Status  Orders  (From admission, onward)        Start     Ordered   05/24/17 2259  Do not attempt resuscitation (DNR)  Continuous    Question Answer Comment  In the event of cardiac or respiratory ARREST Do not call a "code blue"   In the event of cardiac or respiratory ARREST Do not perform Intubation, CPR, defibrillation or ACLS   In the event of cardiac or respiratory ARREST Use medication by any route, position, wound care, and other measures to relive pain and suffering. May use oxygen, suction and manual treatment of airway obstruction as needed for comfort.      05/24/17 2258    Code Status History    Date Active Date Inactive Code Status Order ID Comments User Context   02/16/2017 1928 02/17/2017 1949 Full Code [id number]  Corky Mull, MD Inpatient   02/03/2017 1230 02/04/2017 1907 DNR [id number]  Nicholes Mango, MD ED   01/01/2017 2027 01/08/2017 1858 Full Code [id number]  [name], MD ED   05/12/2016 0809 05/14/2016 1439 DNR [id number]  Max Sane, MD Inpatient   05/10/2016 0506 05/12/2016 0809 Full Code [id number]  Saundra Shelling, MD Inpatient   12/15/2015 1515 12/19/2015 0159 Full Code [id number]  Oletta Cohn, DO Inpatient   12/14/2015 1613 12/15/2015 1515 Full Code [id number]  Bettey Costa, MD Inpatient    Advance Directive Documentation     Most Recent Value  Type of Advance Directive  Healthcare Power of Attorney  Pre-existing out of facility DNR order (yellow form or pink MOST form)  -  "MOST" Form in Place?  -     Disposition Plan: To be determined based on clinical course  Consultants:  Nephrology  Procedures:  Right-sided thoracentesis  Antibiotics:  Vancomycin  Cefepime  Time spent: 28 minutes  [place]

## 2017-05-25 NOTE — Progress Notes (Signed)
[facility], [place] 05/25/17  Subjective:   Patient admitted from the dialysis unit for decreased responsiveness Evaluation in the emergency room shows large right pleural effusion and possible pneumonia Patient complained of feeling tired and sleepy all the time   Objective:  Vital signs in last 24 hours:  Temp:  [97.5 F (36.4 C)-98.6 F (37 C)] 97.8 F (36.6 C) (04/30 0350) Pulse Rate:  [65-70] 66 (04/30 0950) Resp:  [20-26] 20 (04/30 0350) BP: (122-155)/(55-78) 122/58 (04/30 0950) SpO2:  [93 %-96 %] 96 % (04/30 0350) Weight:  [92 lb 1.6 oz (41.8 kg)-99 lb 3.3 oz (45 kg)] 92 lb 1.6 oz (41.8 kg) (04/29 2252)  Weight change:  Filed Weights   05/24/17 1737 05/24/17 2252  Weight: 99 lb 3.3 oz (45 kg) 92 lb 1.6 oz (41.8 kg)    Intake/Output:    Intake/Output Summary (Last 24 hours) at 05/25/2017 1112 Last data filed at 05/25/2017 0350 Gross per 24 hour  Intake 526 ml  Output 0 ml  Net 526 ml     Physical Exam: General:  Frail, elderly, lying in the bed  HEENT  anicteric, moist mucous membranes  Neck  supple  Pulm/lungs  normal breathing effort, decreased breath sounds at right base  CVS/Heart  regular, no rub  Abdomen:   Soft  Extremities:  No edema  Neurologic:  Alert, able to follow commands  Skin:  Multiple ecchymosis  Access:  Right forearm AV fistula, left IJ PermCath       Basic Metabolic Panel:  Recent Labs  Lab 05/24/17 1908 05/25/17 0538  NA 134* 133*  K 3.8 3.8  CL 94* 97*  CO2 27 25  GLUCOSE 74 90  BUN 19 27*  CREATININE 2.44* 3.16*  CALCIUM 8.9 8.4*     CBC: Recent Labs  Lab 05/24/17 1908 05/25/17 0538  WBC 6.7 6.2  NEUTROABS 5.0  --   HGB 13.0 11.5*  HCT 39.1 34.3*  MCV 90.1 90.3  PLT 203 196      Lab Results  Component Value Date   HEPBSAG Negative 01/04/2017      Microbiology:  Recent Results (from the past 240 hour(s))  Blood culture (routine x 2)     Status: None (Preliminary  result)   Collection Time: 05/24/17  7:08 PM  Result Value Ref Range Status   Specimen Description BLOOD L FA  Final   Special Requests   Final    BOTTLES DRAWN AEROBIC AND ANAEROBIC Blood Culture adequate volume   Culture   Final    NO GROWTH < 24 HOURS Performed at [facility], [address] [place], [place] [address]    Report Status PENDING  Incomplete  Blood culture (routine x 2)     Status: None (Preliminary result)   Collection Time: 05/24/17  7:11 PM  Result Value Ref Range Status   Specimen Description BLOOD BLOOD LEFT WRIST  Final   Special Requests   Final    BOTTLES DRAWN AEROBIC AND ANAEROBIC Blood Culture adequate volume   Culture   Final    NO GROWTH < 24 HOURS Performed at [facility], [address] [place], [place] [address]    Report Status PENDING  Incomplete  MRSA PCR Screening     Status: Abnormal   Collection Time: 05/25/17  5:02 AM  Result Value Ref Range Status   MRSA by PCR POSITIVE (A) NEGATIVE Final    Comment:  The GeneXpert MRSA Assay (FDA approved for NASAL specimens only), is one component of a comprehensive MRSA colonization surveillance program. It is not intended to diagnose MRSA infection nor to guide or monitor treatment for MRSA infections. RESULT CALLED TO, READ BACK BY AND VERIFIED WITH: MARCELLA TURNER AT [phone] ON 05/25/2017 JJB Performed at [facility], [address] [place], [place] [address]     Coagulation Studies: No results for input(s): LABPROT, INR in the last 72 hours.  Urinalysis: No results for input(s): COLORURINE, LABSPEC, PHURINE, GLUCOSEU, HGBUR, BILIRUBINUR, KETONESUR, PROTEINUR, UROBILINOGEN, NITRITE, LEUKOCYTESUR in the last 72 hours.  Invalid input(s): APPERANCEUR    Imaging: Dg Chest 2 View  Result Date: 05/24/2017 CLINICAL DATA:  Tan sputum with lethargy EXAM: CHEST - 2 VIEW COMPARISON:  04/21/2017, 04/07/2017, 02/13/2017 FINDINGS: Moderate to large right  pleural effusion. Atelectasis or pneumonia at the right middle lobe and right base. Partial atelectasis medial left base. Cardiomegaly with vascular congestion. Aortic atherosclerosis. No pneumothorax. Postsurgical changes of the right clavicle. Spine appears osteopenic with stable mild midthoracic wedging. IMPRESSION: 1. Moderate to large right pleural effusion with atelectasis or pneumonia at the right middle lobe and right base. 2. Cardiomegaly with vascular congestion. Electronically Signed   By: Donavan Foil M.D.   On: 05/24/2017 18:35     Medications:   . ceFEPime (MAXIPIME) IV Stopped (05/25/17 0111)  . [START ON 05/26/2017] vancomycin     . B-complex with vitamin C  1 tablet Oral Daily  . feeding supplement  1 Container Oral TID BM  . feeding supplement (PRO-STAT SUGAR FREE 64)  30 mL Oral BID  . heparin  5,000 Units Subcutaneous Q8H  . levothyroxine  25 mcg Oral QAC breakfast  . metoprolol succinate  25 mg Oral Daily  . multivitamin  1 tablet Oral Daily  . multivitamin-lutein  1 capsule Oral Daily  . ramipril  2.5 mg Oral QHS  . sertraline  150 mg Oral QHS   acetaminophen, docusate sodium, guaiFENesin-dextromethorphan, metoCLOPramide, polyethylene glycol  Assessment/ Plan:  84 y.o. female  with end stage renal disease on hemodialysis, coronary artery disease, pacemaker placement, meniere's disease, congestive heart failure, peripheral vascular disease, multiple falls and fractures.   MWF [facility]   1.  End-stage renal disease 2.  Anemia of chronic kidney disease 3.  Secondary hyperparathyroidism 4.  Right pleural effusion, possible pneumonia  Plan: Hemodialysis tomorrow to follow MWF schedule PermCath removal (this was scheduled already as outpatient) Right chest thoracentesis planned Palliative team will be following  Monitor Hgb and Phos    LOS: [place] 4/30/201911:12 AM  [place], [place]  Note:  This note was prepared with Dragon dictation. Any transcription errors are unintentional

## 2017-05-25 NOTE — Procedures (Signed)
Interventional Radiology Procedure Note  Procedure: US guided right thoracentesis  Complications: None  Estimated Blood Loss: < 10 mL  Findings: 1400 mL dark yellow fluid able to be removed from right pleural space.  Post CXR pending.  Venetia Night. Kathlene Cote, M.D Pager:  [phone]

## 2017-05-25 NOTE — NC FL2 (Signed)
[place] LEVEL OF CARE SCREENING TOOL     IDENTIFICATION  Patient [name] Birthdate: September 27, 1926 Sex: female Admission Date (Current Location): 05/24/2017  [facility] and [place] Number:  Engineering geologist and Address:  [facility], [address], [place], [place] [address]      Provider Number: [id number]  Attending Physician Name and Address:  Loletha Grayer, MD  Relative Name and Phone Number:  Santina Evans- Brother [phone]    Current Level of Care: Hospital Recommended Level of Care: [place] Prior Approval Number:    Date Approved/Denied:   PASRR Number:    Discharge Plan: SNF    Current Diagnoses: Patient Active Problem List   Diagnosis Date Noted  . Pneumonia 05/24/2017  . Complication from renal dialysis device 04/01/2017  . Displaced fracture of lateral end of right clavicle, initial encounter for closed fracture 02/16/2017  . FTT (failure to thrive) in adult 02/03/2017  . ESRD on hemodialysis ([place])   . Pleural effusion on right   . Palliative care by specialist   . Pulmonary embolism ([place]) 01/02/2017  . Syncope 01/01/2017  . Vision changes 08/27/2016  . Closed fracture of neck of femur ([place] [address]) 05/26/2016  . Hip fracture ([place]) 05/10/2016  . Falls 04/27/2016  . Head injury 04/27/2016  . Age-related osteoporosis with current pathological fracture with routine healing 04/24/2016  . Recurrent falls 04/24/2016  . Mild protein-calorie malnutrition ([place]) 04/24/2016  . ESRD on dialysis ([place]) 04/10/2016  . Periprosthetic fracture around internal prosthetic left knee joint 01/26/2016  . Pressure injury of skin 12/15/2015  . Closed left subtrochanteric femur fracture ([place]) 12/15/2015  . Femur fracture, left ([place]) 12/14/2015  . Meniere disease   . Loss of weight 10/21/2015  . Clinical depression 06/20/2015  . Dysphagia, unspecified 05/24/2015  . Imbalance 04/22/2015  . Chronic pain  syndrome 05/22/2014  . Insomnia 03/13/2014  . Anxiety state 03/13/2014  . Chronic systolic CHF (congestive heart failure), NYHA class 3 ([place]) 01/15/2014  . OSA (obstructive sleep apnea) 01/15/2014  . Basal cell carcinoma of neck 11/14/2013  . DDD (degenerative disc disease), cervical 11/07/2013  . Cervical radiculitis 10/16/2013  . Benign essential hypertension 09/12/2013  . Memory loss 09/12/2013  . Systolic heart failure, chronic ([place]) 05/12/2013  . Goals of care, counseling/discussion 11/10/2012  . Sinus node dysfunction ([place]) 08/01/2012  . Low back pain 08/01/2012  . Cervical spine pain 05/02/2012  . Irritable bowel syndrome 11/02/2011  . Depression 04/01/2011  . Hypertension 04/01/2011    Orientation RESPIRATION BLADDER Height & Weight     Self, Time, Place  Normal Continent Weight: 92 lb 1.6 oz (41.8 kg) Height:  5\' 4"  (162.6 cm)  BEHAVIORAL SYMPTOMS/MOOD NEUROLOGICAL BOWEL NUTRITION STATUS  (None ) (none) Continent Diet(Dysphagia 2 )  AMBULATORY STATUS COMMUNICATION OF NEEDS Skin   Extensive Assist Verbally PU Stage and Appropriate Care                       Personal Care Assistance Level of Assistance  Bathing, Feeding, Dressing Bathing Assistance: Maximum assistance Feeding assistance: Independent Dressing Assistance: Maximum assistance     Functional Limitations Info  Hearing   Hearing Info: Impaired      SPECIAL CARE FACTORS FREQUENCY  (MRSA in [place])                    Contractures Contractures Info: Not present    Additional Factors Info  Code Status,  Allergies Code Status Info: DNR Allergies Info: Statins, Codeine Sulfate, Codeine, Effexor, Ezetimibe-simvastatin, Codeine            Current Medications (05/25/2017):  This is the current hospital active medication list Current Facility-Administered Medications  Medication Dose Route Frequency Provider Last Rate Last Dose  . acetaminophen (TYLENOL) tablet 650 mg  650 mg Oral Q4H PRN  Vaughan Basta, MD   650 mg at 05/25/17 0407  . B-complex with vitamin C tablet 1 tablet  1 tablet Oral Daily Vaughan Basta, MD   1 tablet at 05/25/17 0950  . ceFEPIme (MAXIPIME) 1 g in sodium chloride 0.9 % 100 mL IVPB  1 g Intravenous Q24H Vaughan Basta, MD   Stopped at 05/25/17 0111  . docusate sodium (COLACE) capsule 100 mg  100 mg Oral BID PRN Vaughan Basta, MD      . guaiFENesin-dextromethorphan (ROBITUSSIN DM) 100-10 MG/5ML syrup 5 mL  5 mL Oral Q4H PRN Vaughan Basta, MD   5 mL at 05/25/17 0500  . heparin injection 5,000 Units  5,000 Units Subcutaneous Q8H Vaughan Basta, MD   5,000 Units at 05/25/17 0501  . levothyroxine (SYNTHROID, LEVOTHROID) tablet 25 mcg  25 mcg Oral QAC breakfast Vaughan Basta, MD   25 mcg at 05/25/17 0746  . metoCLOPramide (REGLAN) tablet 5 mg  5 mg Oral Q8H PRN Vaughan Basta, MD      . metoprolol succinate (TOPROL-XL) 24 hr tablet 25 mg  25 mg Oral Daily Vaughan Basta, MD   25 mg at 05/25/17 0950  . multivitamin (RENA-VIT) tablet 1 tablet  1 tablet Oral Daily Vaughan Basta, MD   1 tablet at 05/25/17 0950  . polyethylene glycol (MIRALAX / GLYCOLAX) packet 17 g  17 g Oral Daily PRN Vaughan Basta, MD      . ramipril (ALTACE) capsule 2.5 mg  2.5 mg Oral QHS Vaughan Basta, MD   2.5 mg at 05/25/17 0023  . sertraline (ZOLOFT) tablet 150 mg  150 mg Oral QHS Vaughan Basta, MD   150 mg at 05/25/17 0023  . [START ON 05/26/2017] vancomycin (VANCOCIN) 500 mg in sodium chloride 0.9 % 100 mL IVPB  500 mg Intravenous Q M,W,F-HD Vaughan Basta, MD         Discharge Medications: Please see discharge summary for a list of discharge medications.  Relevant Imaging Results:  Relevant Lab Results:   Additional Information    Marisal Swarey  Louretta Shorten, LCSWA

## 2017-05-25 NOTE — Progress Notes (Signed)
Initial Nutrition Assessment  DOCUMENTATION CODES:   Severe malnutrition in context of chronic illness  INTERVENTION:   Prostat liquid protein PO 30 ml BID with meals, each supplement provides 100 kcal, 15 grams protein.  B-complex with C daily   Boost Breeze po TID, each supplement provides 250 kcal and 9 grams of protein  Rena-vite daily  Ocuvite daily for wound healing (provides zinc, vitamin A, vitamin C, Vitamin E, copper, and selenium)  NUTRITION DIAGNOSIS:   Severe Malnutrition related to chronic illness(ESRD on HD, CHF) as evidenced by 12 percent weight loss 2 months, severe fat depletion, severe muscle depletion.  GOAL:   Patient will meet greater than or equal to 90% of their needs  MONITOR:   PO intake, Supplement acceptance, Labs, Weight trends, Skin, I & O's  REASON FOR ASSESSMENT:   Consult Assessment of nutrition requirement/status  ASSESSMENT:   84 y.o. female with a known history of nemia, coronary artery disease, skin cancer, cardiomyopathy, CHF with ejection fraction 20%, irritable bowel syndrome, myocardial infarction, obstructive sleep apnea, end-stage renal disease on hemodialysis- lives in [place] for last 1-1/2 years since she had hip surgery and she is mostly bedbound. Pt admitted for CHF   Met with pt in room today. RD familiar with this pt from previous admits. Pt reports poor appetite and oral intake at baseline. Pt reports that she hates the food at [facility] and she complains of not being able to get salt, sauces, or gravy with her meals. Pt reports that she gets the same foods every day and she is tired of them. RD suspects this pt is likely on a renal diet. Per chart, pt has lost ~12lbs(12%) over the past two months; this is significant given the time frame. Pt with Stage I pressure injury and widespread ecchymosis; suspect scurvy in the setting of chronic HD and poor oral intake. Pt does not like milky supplements as she reports  that they upset her IBS but pt will drink Boost Breeze and Prostat poured over ice. RD will order supplements and Ocuvite to encourage wound healing and replace losses from HD. Pt on dysphagia 2 diet.   Medications reviewed and include: B-Complex with C, heparin, synthroid, rena-vite, ocuvite, cefepime, vancomycin   Labs reviewed: Na 133(L), Cl 97(L), BUN 27(H), creat 3.16(H), Ca 8.4(L)  NUTRITION - FOCUSED PHYSICAL EXAM:    Most Recent Value  Orbital Region  Severe depletion  Upper Arm Region  Severe depletion  Thoracic and Lumbar Region  Severe depletion  Buccal Region  Severe depletion  Temple Region  Severe depletion  Clavicle Bone Region  Severe depletion  Clavicle and Acromion Bone Region  Severe depletion  Scapular Bone Region  Severe depletion  Dorsal Hand  Severe depletion  Patellar Region  Severe depletion  Anterior Thigh Region  Severe depletion  Posterior Calf Region  Severe depletion  Edema (RD Assessment)  None  Hair  Reviewed  Eyes  Reviewed  Mouth  Reviewed  Skin  Reviewed  Nails  Reviewed     Diet Order:   Diet Order           DIET DYS 2 Room service appropriate? Yes; Fluid consistency: Thin  Diet effective now         EDUCATION NEEDS:   Education needs have been addressed  Skin:  Skin Assessment: (ecchymosis, Stage I coccyx )  Last BM:  4/30- TYPE 6  Height:   Ht Readings from Last 1 Encounters:  05/24/17 _0  (  1.626 m)    Weight:   Wt Readings from Last 1 Encounters:  05/24/17 92 lb 1.6 oz (41.8 kg)    Ideal Body Weight:  54.5 kg  BMI:  Body mass index is 15.81 kg/m.  Estimated Nutritional Needs:   Kcal:  1200-1400kcal/day   Protein:  63-73g/day   Fluid:  >1.0L/day   Koleen Distance MS, RD, LDN Pager #[phone] After Hours Pager: [phone]

## 2017-05-26 DIAGNOSIS — E43 Unspecified severe protein-calorie malnutrition: Secondary | ICD-10-CM

## 2017-05-26 DIAGNOSIS — N186 End stage renal disease: Secondary | ICD-10-CM

## 2017-05-26 DIAGNOSIS — J90 Pleural effusion, not elsewhere classified: Secondary | ICD-10-CM

## 2017-05-26 DIAGNOSIS — T82868A Thrombosis of vascular prosthetic devices, implants and grafts, initial encounter: Secondary | ICD-10-CM

## 2017-05-26 DIAGNOSIS — Z515 Encounter for palliative care: Secondary | ICD-10-CM

## 2017-05-26 DIAGNOSIS — Z7189 Other specified counseling: Secondary | ICD-10-CM

## 2017-05-26 LAB — GASTROINTESTINAL PANEL BY PCR, STOOL (REPLACES STOOL CULTURE)
Adenovirus F40/41: NOT DETECTED
Astrovirus: NOT DETECTED
CAMPYLOBACTER SPECIES: NOT DETECTED
CRYPTOSPORIDIUM: NOT DETECTED
Cyclospora cayetanensis: NOT DETECTED
Entamoeba histolytica: NOT DETECTED
Enteroaggregative E coli (EAEC): NOT DETECTED
Enteropathogenic E coli (EPEC): NOT DETECTED
Enterotoxigenic E coli (ETEC): NOT DETECTED
GIARDIA LAMBLIA: NOT DETECTED
Norovirus GI/GII: NOT DETECTED
PLESIMONAS SHIGELLOIDES: NOT DETECTED
ROTAVIRUS A: NOT DETECTED
SALMONELLA SPECIES: NOT DETECTED
SHIGELLA/ENTEROINVASIVE E COLI (EIEC): NOT DETECTED
Sapovirus (I, II, IV, and V): NOT DETECTED
Shiga like toxin producing E coli (STEC): NOT DETECTED
VIBRIO SPECIES: NOT DETECTED
Vibrio cholerae: NOT DETECTED
YERSINIA ENTEROCOLITICA: NOT DETECTED

## 2017-05-26 LAB — CYTOLOGY - NON PAP

## 2017-05-26 LAB — C DIFFICILE QUICK SCREEN W PCR REFLEX
C DIFFICILE (CDIFF) TOXIN: NEGATIVE
C Diff antigen: POSITIVE — AB

## 2017-05-26 LAB — PROTEIN, BODY FLUID (OTHER): TOTAL PROTEIN, BODY FLUID OTHER: 2.9 g/dL

## 2017-05-26 LAB — CLOSTRIDIUM DIFFICILE BY PCR, REFLEXED: CDIFFPCR: POSITIVE — AB

## 2017-05-26 LAB — PHOSPHORUS: Phosphorus: 4.7 mg/dL — ABNORMAL HIGH (ref 2.5–4.6)

## 2017-05-26 MED ORDER — VANCOMYCIN 50 MG/ML ORAL SOLUTION
125.0000 mg | Freq: Four times a day (QID) | ORAL | Status: DC
  Administered 2017-05-26 – 2017-06-01 (×21): 125 mg via ORAL
  Filled 2017-05-26 (×26): qty 2.5

## 2017-05-26 NOTE — Progress Notes (Signed)
Pre HD assessment    05/26/17 1000  Vital Signs  Temp 97.8 F (36.6 C)  Temp Source Oral  Pulse Rate (!) 59  Pulse Rate Source Monitor  Resp 18  BP (!) 126/91  BP Location Left Arm  BP Method Automatic  Patient Position (if appropriate) Lying  Oxygen Therapy  SpO2 100 %  O2 Device Room Air  Pain Assessment  Pain Scale 0-10  Pain Score 0  Dialysis Weight  Weight 42.8 kg (94 lb 5.7 oz)  Type of Weight Pre-Dialysis  Time-Out for Hemodialysis  What Procedure? HD  Pt Identifiers(min of two) First/Last Name;MRN/Account#  Correct Site? Yes  Correct Side? Yes  Correct Procedure? Yes  Consents Verified? Yes  Rad Studies Available? N/A  Safety Precautions Reviewed? Yes  Engineer, civil (consulting) Number  (4A)  Station Number  (bedside,  rm 219)  UF/Alarm Test Passed  Conductivity: Meter 13.6  Conductivity: Machine  13.9  pH 7.4  Reverse Osmosis  ([id number])  Normal Saline Lot Number [id number]  Dialyzer Lot Number [id number]  Disposable Set Lot Number [id number]  Machine Temperature 98.6 F (37 C)  Musician and Audible Yes  Blood Lines Intact and Secured Yes  Pre Treatment Patient Checks  Vascular access used during treatment Catheter (unable to access AVF, MD aware)  Hepatitis B Surface Antigen Results  (unk)  Isolation Initiated Yes (enteric precautions)  Hepatitis B Surface Antibody  (unk)  Date Hepatitis B Surface Antibody Drawn 05/26/17  Hemodialysis Consent Verified Yes  Hemodialysis Standing Orders Initiated Yes  ECG (Telemetry) Monitor On Other (Comment) (bedside)  Prime Ordered Normal Saline  Length of  DialysisTreatment -hour(s) 3 Hour(s)  Dialysis Treatment Comments  (unable to access AVF, 2 sticks by 2RNs. CVC used, MD aware)  Dialyzer Elisio 17H NR  Dialysate 3K, 2.5 Ca  Dialysis Anticoagulant None  Dialysate Flow Ordered 800  Blood Flow Rate Ordered 400 mL/min  Ultrafiltration Goal 0 Liters  Pre Treatment Labs Hepatitis B Surface  Antigen (HBSAB)  Dialysis Blood Pressure Support Ordered Normal Saline  Education / Care Plan  Dialysis Education Provided Yes  Documented Education in Care Plan Yes  Fistula / Graft Right Forearm Arteriovenous fistula  Placement Date/Time: 04/15/16 1341   Placed prior to admission: No  Orientation: Right  Access Location: Forearm  Access Type: Arteriovenous fistula  Site Condition No complications  Fistula / Graft Assessment Present;Thrill;Bruit  Drainage Description None

## 2017-05-26 NOTE — Progress Notes (Signed)
[facility], [place] 05/26/17  Subjective:   Patient admitted from the dialysis unit for decreased responsiveness; Evaluation in the emergency room shows large right pleural effusion and possible pneumonia.   Patient had thoracentesis yesterday.  1400 cc of fluid was removed Today, she states that she is able to breathe better.  She is also hungry and able to eat better.  She is coughing up dark yellow sputum.   Objective:  Vital signs in last 24 hours:  Temp:  [97.4 F (36.3 C)-97.8 F (36.6 C)] 97.6 F (36.4 C) (05/01 0552) Pulse Rate:  [59-69] 61 (05/01 0552) Resp:  [18-20] 20 (05/01 0552) BP: (115-129)/(56-60) 129/60 (05/01 0552) SpO2:  [95 %-99 %] 99 % (05/01 0552)  Weight change:  Filed Weights   05/24/17 1737 05/24/17 2252  Weight: 99 lb 3.3 oz (45 kg) 92 lb 1.6 oz (41.8 kg)    Intake/Output:    Intake/Output Summary (Last 24 hours) at 05/26/2017 1093 Last data filed at 05/26/2017 0030 Gross per 24 hour  Intake 212 ml  Output 25 ml  Net 187 ml     Physical Exam: General:  Frail, elderly, lying in the bed  HEENT  anicteric, moist mucous membranes  Neck  supple  Pulm/lungs  normal breathing effort,  bibasilar crackles  CVS/Heart  regular, no rub  Abdomen:   Soft  Extremities:  No edema  Neurologic:  Alert, able to follow commands  Skin:  Multiple ecchymosis  Access:  Right forearm AV fistula, left IJ PermCath       Basic Metabolic Panel:  Recent Labs  Lab 05/24/17 1908 05/25/17 0538  NA 134* 133*  K 3.8 3.8  CL 94* 97*  CO2 27 25  GLUCOSE 74 90  BUN 19 27*  CREATININE 2.44* 3.16*  CALCIUM 8.9 8.4*     CBC: Recent Labs  Lab 05/24/17 1908 05/25/17 0538  WBC 6.7 6.2  NEUTROABS 5.0  --   HGB 13.0 11.5*  HCT 39.1 34.3*  MCV 90.1 90.3  PLT 203 196      Lab Results  Component Value Date   HEPBSAG Negative 01/04/2017      Microbiology:  Recent Results (from the past 240 hour(s))  Blood culture (routine  x 2)     Status: None (Preliminary result)   Collection Time: 05/24/17  7:08 PM  Result Value Ref Range Status   Specimen Description BLOOD L FA  Final   Special Requests   Final    BOTTLES DRAWN AEROBIC AND ANAEROBIC Blood Culture adequate volume   Culture   Final    NO GROWTH 2 DAYS Performed at [facility], [address] [place], [place] [address]    Report Status PENDING  Incomplete  Blood culture (routine x 2)     Status: None (Preliminary result)   Collection Time: 05/24/17  7:11 PM  Result Value Ref Range Status   Specimen Description BLOOD BLOOD LEFT WRIST  Final   Special Requests   Final    BOTTLES DRAWN AEROBIC AND ANAEROBIC Blood Culture adequate volume   Culture   Final    NO GROWTH 2 DAYS Performed at [facility], [address] [place], [place] [address]    Report Status PENDING  Incomplete  MRSA PCR Screening     Status: Abnormal   Collection Time: 05/25/17  5:02 AM  Result Value Ref Range Status   MRSA by PCR POSITIVE (A) NEGATIVE Final    Comment:  The GeneXpert MRSA Assay (FDA approved for NASAL specimens only), is one component of a comprehensive MRSA colonization surveillance program. It is not intended to diagnose MRSA infection nor to guide or monitor treatment for MRSA infections. RESULT CALLED TO, READ BACK BY AND VERIFIED WITH: MARCELLA TURNER AT [place] ON 05/25/2017 JJB Performed at [facility], [address] [place], [place] [address]   Body fluid culture     Status: None (Preliminary result)   Collection Time: 05/25/17  3:15 PM  Result Value Ref Range Status   Specimen Description   Final    PLEURAL Performed at [facility], [address] [place], [place] [address]    Special Requests   Final    NONE Performed at [facility], [address] [place], [place] [address]    Gram Stain   Final    NO WBC SEEN NO ORGANISMS SEEN Performed at [facility], [place] [address], [place], [place] [address]    Culture PENDING  Incomplete   Report Status PENDING  Incomplete    Coagulation Studies: No results for input(s): LABPROT, INR in the last 72 hours.  Urinalysis: No results for input(s): COLORURINE, LABSPEC, PHURINE, GLUCOSEU, HGBUR, BILIRUBINUR, KETONESUR, PROTEINUR, UROBILINOGEN, NITRITE, LEUKOCYTESUR in the last 72 hours.  Invalid input(s): APPERANCEUR    Imaging: Dg Chest 1 View  Result Date: 05/25/2017 CLINICAL DATA:  Status post right thoracentesis. EXAM: CHEST  1 VIEW COMPARISON:  05/24/2017 FINDINGS: After thoracentesis there is significant reduction in right pleural fluid volume with small amount of fluid remaining. No pneumothorax following thoracentesis. Atelectasis and/or infiltrate of the underlying right lower lung present. No edema. Stable heart size. Stable appearance of pacemaker and left-sided dialysis catheter. IMPRESSION: Significant reduction in right pleural fluid volume after thoracentesis with no pneumothorax identified. Underlying atelectasis and/or infiltrate of the right lower lung. Electronically Signed   By: Aletta Edouard M.D.   On: 05/25/2017 16:27   Dg Chest 2 View  Result Date: 05/24/2017 CLINICAL DATA:  Tan sputum with lethargy EXAM: CHEST - 2 VIEW COMPARISON:  04/21/2017, 04/07/2017, 02/13/2017 FINDINGS: Moderate to large right pleural effusion. Atelectasis or pneumonia at the right middle lobe and right base. Partial atelectasis medial left base. Cardiomegaly with vascular congestion. Aortic atherosclerosis. No pneumothorax. Postsurgical changes of the right clavicle. Spine appears osteopenic with stable mild midthoracic wedging. IMPRESSION: 1. Moderate to large right pleural effusion with atelectasis or pneumonia at the right middle lobe and right base. 2. Cardiomegaly with vascular congestion. Electronically Signed   By: Donavan Foil M.D.   On: 05/24/2017 18:35   US Thoracentesis Asp Pleural Space W/img Guide  Result Date:  05/25/2017 CLINICAL DATA:  Right pleural effusion. EXAM: ULTRASOUND GUIDED RIGHT THORACENTESIS COMPARISON:  Chest x-ray on 05/24/2017 PROCEDURE: An ultrasound guided thoracentesis was thoroughly discussed with the patient's brother and questions answered. The benefits, risks, alternatives and complications were also discussed. The patient's brother understands and wishes to proceed with the procedure. Written consent was obtained. Ultrasound was performed to localize and mark an adequate pocket of fluid in the right chest. The area was then prepped and draped in the normal sterile fashion. 1% Lidocaine was used for local anesthesia. Under ultrasound guidance a 6 French Safe-T-Centesis catheter was introduced. Thoracentesis was performed. The catheter was removed and a dressing applied. COMPLICATIONS: None FINDINGS: A total of approximately 1.4 L of dark, yellow fluid was removed. A fluid sample was sent for laboratory analysis. IMPRESSION: Successful ultrasound guided right  thoracentesis yielding 1.4 L of pleural fluid. Electronically Signed   By: Aletta Edouard M.D.   On: 05/25/2017 14:52     Medications:   . ceFEPime (MAXIPIME) IV Stopped (05/25/17 2355)  . vancomycin     . B-complex with vitamin C  1 tablet Oral Daily  . Chlorhexidine Gluconate Cloth  6 each Topical Q0600  . feeding supplement  1 Container Oral TID BM  . feeding supplement (PRO-STAT SUGAR FREE 64)  30 mL Oral BID  . heparin  5,000 Units Subcutaneous Q8H  . levothyroxine  25 mcg Oral QAC breakfast  . metoprolol succinate  25 mg Oral Daily  . multivitamin  1 tablet Oral Daily  . multivitamin-lutein  1 capsule Oral Daily  . mupirocin ointment  1 application Nasal BID  . ramipril  2.5 mg Oral QHS  . sertraline  150 mg Oral QHS   acetaminophen, docusate sodium, guaiFENesin-dextromethorphan, metoCLOPramide, polyethylene glycol  Assessment/ Plan:  84 y.o. female  with end stage renal disease on hemodialysis, coronary artery  disease, pacemaker placement, meniere's disease, congestive heart failure, peripheral vascular disease, multiple falls and fractures.   MWF [facility]   1.  End-stage renal disease 2.  Anemia of chronic kidney disease 3.  Secondary hyperparathyroidism 4.  Right pleural effusion, possible pneumonia  Plan: Hemodialysis today to follow MWF schedule PermCath removal (this was scheduled already as outpatient) Right chest thoracentesis - 1400 cc removed. Able to breath better Palliative team will be following  Monitor Hgb and Phos    LOS: 2 Ayzia Day Candiss Sharon [name] 5/1/20199:38 AM  [facility] [place], [place]  Note: This note was prepared with Dragon dictation. Any transcription errors are unintentional

## 2017-05-26 NOTE — Consult Note (Addendum)
Consultation Note Date: 05/26/2017   Patient [name]  DOB: 02/02/1926  MRN: [id number]  Age / Sex: 84 y.o., female  PCP: Kirk Ruths, MD Referring Physician: Loletha Grayer, MD  Reason for Consultation: Establishing goals of care and Psychosocial/spiritual support  HPI/Patient Profile: 84 y.o. female  with past medical history of CHF (EF of 20%), ESRD on HD, CAD, OSA, primarily bedbound who was admitted on 05/24/2017 with shortness of breath and recurrent pleural effusion. Chest xray showed a possible pneumonia.  She has had 4 admissions in the past 6 months.  Clinical Assessment and Goals of Care:  I have reviewed medical records including EPIC notes, labs and imaging, received report from the care team, assessed the patient and then talked on the phone at length with her brother Sharon [name] to discuss diagnosis prognosis, [place], EOL wishes, disposition and options.  I introduced Palliative Medicine as specialized medical care for people living with serious illness. It focuses on providing relief from the symptoms and stress of a serious illness. The goal is to improve quality of life for both the patient and the family.  We discussed a brief life review of the patient. She married but her husband passed 7 years ago.  During her life she primarily took care of her family.  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  As far as functional and nutritional status the patient is primarily bed bound and is eating small amounts.  Per her brother she has had difficulties with falling out of bed.  The SNF is unable to restrain Sharon [name] for her own safety.  We discussed Sharon [name]'s current illness and what it means in the larger context of her on-going co-morbidities.  Natural disease trajectory and expectations at EOL were discussed.  I attempted to elicit values  and goals of care important to the patient.  She is concerned with making amends to her family and wants to find meaning in her life.     We discussed potentially stopping hemodialysis and going to hospice house - but at this point Sharon [name] is simply not ready to do that.  Nor does Sharon [name] feel Sharon [name] is ready for hospice house and end of life.  Sharon [name] expressed that more than anything she wants to return to her home.  We discussed going home with hospice to pass away.  Sharon [name] did not support that idea.  There is some consideration of selling the home in order to pay for Sharon [name]'s expenses at [facility].  We discussed the PleurX catheter placement.  Sharon [name] leaned against the placement - feeling that the potential for infection from the drain outweighed the benefit of not returning to the hospital every few months to have thoracentesis.  I asked Sharon [name] "if Sharon [name] were to become much worse when is the appropriate time to stop hemodialysis".  After some consideration Sharon [name] replied that if Sharon [name] had no quality of life - if she is unable to feed herself, if she  is unconscious or unable to converse, and certainly if HD is no longer working - then it will be time for hospice.  The difference between aggressive medical intervention and comfort care was considered in light of the patient's goals of care.   At this point Sharon [name] still desires full medical treatment.  Sharon [name] told me she is a [place] and asked to speak with her Sharon [name] from [facility].  Primary Decision Maker:  PATIENT    SUMMARY OF RECOMMENDATIONS     Continue full scope treatment. Treat the treatable.  Based on the thought that Sharon [name] only needs thoracentesis every 3-4 months - her brother Sharon [name] recommended against Pleurx cathether at this time.  Sharon [name] is alert and orientated - she has not made a decision about the PleurX cath.  Anticipate that unless she declines she will return to [facility] with Palliative to follow.  I called the patient's pastor to request a  visit.  Code Status/Advance Care Planning:  DNR   Symptom Management:   Per primary team.  Psycho-social/Spiritual:   Desire for further Chaplaincy support: yes from [facility]  Prognosis:  She is at high risk to die of an acute event.  Barring that she likely has weeks to months.  Discharge Planning: [place] for rehab with Palliative care service follow-up      Primary Diagnoses: Present on Admission: **None**   I have reviewed the medical record, interviewed the patient and family, and examined the patient. The following aspects are pertinent.  Past Medical History:  Diagnosis Date  . Anemia   . Arthritis   . CAD (coronary artery disease)   . Cancer ([place])    skin  . Cardiomyopathy ([place])   . CHF (congestive heart failure) ([place])   . Depression   . IBS (irritable bowel syndrome) 2010  . Kidney failure July 2012   Hemodialysis 3xweek  . Kidney failure   . Meniere disease   . Meniere's disease   . Myocardial infarction ([place])   . OSA (obstructive sleep apnea)    CPAP  . Peritoneal dialysis status ([place])   . Presence of IVC filter 2019  . Presence of permanent cardiac pacemaker   . Renal insufficiency    Social History   Socioeconomic History  . Marital status: Widowed    Spouse name: Not on file  . Number of children: 0  . Years of education: Not on file  . Highest education level: Not on file  Occupational History  . Occupation: Retired   Scientific laboratory technician  . Financial resource strain: Not on file  . Food insecurity:    Worry: Not on file    Inability: Not on file  . Transportation needs:    Medical: Not on file    Non-medical: Not on file  Tobacco Use  . Smoking status: Former Smoker    Last attempt to quit: 03/31/1948    Years since quitting: 69.2  . Smokeless tobacco: Never Used  Substance and Sexual Activity  . Alcohol use: No  . Drug use: No  . Sexual activity: Never  Lifestyle  . Physical activity:    Days per  week: Not on file    Minutes per session: Not on file  . Stress: Not on file  Relationships  . Social connections:    Talks on phone: Not on file    Gets together: Not on file    Attends religious service: Not on file    Active member of  club or organization: Not on file    Attends meetings of clubs or organizations: Not on file    Relationship status: Not on file  Other Topics Concern  . Not on file  Social History Narrative   Lives in [place] alone. Widow 2012.      Regular Exercise -  Housework   Daily Caffeine Use:  1 - 2 cups coffee            Family History  Problem Relation Age of Onset  . Cancer Mother        ? ovarian - sarcoma  . Cancer Father        Skin cancer  . Diabetes Sister   . COPD Sister   . Depression Sister   . Cancer Sister        Lung - 70 yrs old   Scheduled Meds: . B-complex with vitamin C  1 tablet Oral Daily  . Chlorhexidine Gluconate Cloth  6 each Topical Q0600  . feeding supplement  1 Container Oral TID BM  . feeding supplement (PRO-STAT SUGAR FREE 64)  30 mL Oral BID  . heparin  5,000 Units Subcutaneous Q8H  . levothyroxine  25 mcg Oral QAC breakfast  . metoprolol succinate  25 mg Oral Daily  . multivitamin  1 tablet Oral Daily  . multivitamin-lutein  1 capsule Oral Daily  . mupirocin ointment  1 application Nasal BID  . ramipril  2.5 mg Oral QHS  . sertraline  150 mg Oral QHS   Continuous Infusions: . ceFEPime (MAXIPIME) IV Stopped (05/25/17 2355)  . vancomycin 500 mg (05/26/17 1226)   PRN Meds:.acetaminophen, docusate sodium, guaiFENesin-dextromethorphan, metoCLOPramide, polyethylene glycol Allergies  Allergen Reactions  . Statins Other (See Comments)    Muscle weakness severe  . Codeine Sulfate Nausea And Vomiting  . Codeine Other (See Comments)    GI UPSET  . Effexor [Venlafaxine] Nausea Only  . Ezetimibe-Simvastatin Other (See Comments)    Muscle weakness   Review of Systems patient does not complain of pain.  Her  SOB is much improved post thoracentesis.  Physical Exam  Thin frail chronically ill appearing female.  A&O coherent, pleasant, HOH CV brady Resp no distress Abdomen soft, nt, nd Ext no edema   Vital Signs: BP (!) 130/38 (BP Location: Left Arm)   Pulse (!) 59   Temp 97.8 F (36.6 C) (Oral)   Resp 12   Ht 5\' 4"  (1.626 m)   Wt 42.8 kg (94 lb 5.7 oz)   SpO2 98%   BMI 16.20 kg/m  Pain Scale: 0-10 POSS *See Group Information*: 1-Acceptable,Awake and alert Pain Score: 0-No pain   SpO2: SpO2: 98 % O2 Device:SpO2: 98 % O2 Flow Rate: .   IO: Intake/output summary:   Intake/Output Summary (Last 24 hours) at 05/26/2017 1313 Last data filed at 05/26/2017 1000 Gross per 24 hour  Intake 332 ml  Output 25 ml  Net 307 ml    LBM: Last BM Date: 05/26/17 Baseline Weight: Weight: 45 kg (99 lb 3.3 oz) Most recent weight: Weight: 42.8 kg (94 lb 5.7 oz)     Palliative Assessment/Data: 30%     Time In: 12:00 Time Out: 1:45 Time Total: 105 Greater than 50%  of this time was spent counseling and coordinating care related to the above assessment and plan.  Signed by: Florentina Jenny, PA-C Palliative Medicine Pager: [phone]  Please contact Palliative Medicine Team phone at [phone] for questions and concerns.  For individual provider: See  Amion

## 2017-05-26 NOTE — Progress Notes (Signed)
Post HD assessment    05/26/17 1355  Neurological  Level of Consciousness Responds to Voice  Orientation Level Oriented X4  Respiratory  Respiratory Pattern Regular;Unlabored  Chest Assessment Chest expansion symmetrical  Bilateral Breath Sounds Clear;Diminished  Vascular  R Radial Pulse +2  L Radial Pulse +2  Integumentary  Integumentary (WDL) X  Skin Color Appropriate for ethnicity  Musculoskeletal  Musculoskeletal (WDL) X  Generalized Weakness Yes  Assistive Device [facility]  Gastrointestinal  Bowel Sounds Assessment Active  GU Assessment  Genitourinary (WDL)  (HD)  Psychosocial  Psychosocial (WDL) X  Patient Behaviors Sad;Calm;Cooperative

## 2017-05-26 NOTE — Progress Notes (Signed)
HD tx start    05/26/17 1045  Vital Signs  Pulse Rate 62  Pulse Rate Source Monitor  Resp 15  BP (!) 132/54  BP Location Left Arm  BP Method Automatic  Patient Position (if appropriate) Lying  Oxygen Therapy  SpO2 99 %  O2 Device Room Air  During Hemodialysis Assessment  Blood Flow Rate (mL/min) 400 mL/min  Arterial Pressure (mmHg) -200 mmHg  Venous Pressure (mmHg) 180 mmHg  Transmembrane Pressure (mmHg) 40 mmHg  Ultrafiltration Rate (mL/min) 170 mL/min  Dialysate Flow Rate (mL/min) 800 ml/min  Conductivity: Machine  13.9  HD Safety Checks Performed Yes  Dialysis Fluid Bolus Normal Saline  Bolus Amount (mL) 250 mL  Intra-Hemodialysis Comments Tx initiated

## 2017-05-26 NOTE — Progress Notes (Signed)
Dr Delana Meyer was informed that the fistula in the patients right arm is not working per dialysis nurse

## 2017-05-26 NOTE — Progress Notes (Signed)
Unable to access AVF for HD tx. Arterial access successful, venous unsuccessful. Sharon Bostic(RN) had two unsuccessful attempts, I had one unsuccessful attempt. MD made aware, CVC used for HD tx. Pt was suppose to have CVC removed pending successful AVF access. MD aware, will speak with vascular   05/26/17 1030  Pre Treatment Patient Checks  Dialysis Treatment Comments  ([place] to access AVF)  .

## 2017-05-26 NOTE — Progress Notes (Signed)
Post HD assessment. Pt tolerated tx well. Pt was unable to use AVF for HD tx. Two nurses attempted sticks and were unsuccessful. MD made aware and permission to use CVC was granted. CVC used for HD tx, no complications with CVC. Dressing changed, heparin locked (1.5 x 2 ), lines clamped and capped. Tx ended 10 minutes early r/t venous chamber clotting, MD aware. Net UF -36, goal met.    05/26/17 1346  Vital Signs  Temp 98.2 F (36.8 C)  Temp Source Oral  Pulse Rate 68  Pulse Rate Source Monitor  Resp 18  BP (!) 126/45  BP Location Left Arm  BP Method Automatic  Patient Position (if appropriate) Lying  Oxygen Therapy  SpO2 99 %  O2 Device Room Air  Dialysis Weight  Weight 42.7 kg (94 lb 2.2 oz)  Type of Weight Post-Dialysis  Post-Hemodialysis Assessment  Rinseback Volume (mL) 250 mL  KECN 62.2 V  Dialyzer Clearance Lightly streaked  Duration of HD Treatment -hour(s) 3 hour(s)  Hemodialysis Intake (mL) 500 mL  UF Total -Machine (mL) 464 mL  Net UF (mL) -36 mL  Tolerated HD Treatment Yes  Education / Care Plan  Dialysis Education Provided Yes  Documented Education in Care Plan Yes

## 2017-05-26 NOTE — Progress Notes (Signed)
Patients c.diff antigen positive- but toxin negative- but she has been symptomatic with diarrhea and also on broad spectrum antibiotics for pneumonia. Will start PO vanc-

## 2017-05-26 NOTE — Progress Notes (Signed)
Patient ID: Sharon [name], female   DOB: 08/24/1926, 84 y.o.   MRN: [id number]  [employer] PROGRESS NOTE  Sharon [name] JTT:[id number] DOB: [birth year] DOA: 05/24/2017 PCP: Kirk Ruths, MD  HPI/Subjective: Patient coughing up phlegm this morning.  Feels better after her thoracentesis yesterday.  Some shortness of breath.  Objective: Vitals:   05/26/17 1346 05/26/17 1408  BP: (!) 126/45 (!) 133/49  Pulse: 68 63  Resp: 18 16  Temp: 98.2 F (36.8 C) (!) 97.5 F (36.4 C)  SpO2: 99% 99%    Filed Weights   05/24/17 2252 05/26/17 1000 05/26/17 1346  Weight: 41.8 kg (92 lb 1.6 oz) 42.8 kg (94 lb 5.7 oz) 42.7 kg (94 lb 2.2 oz)    ROS: Review of Systems  Constitutional: Negative for chills and fever.  Eyes: Negative for blurred vision.  Respiratory: Positive for cough and shortness of breath.   Cardiovascular: Negative for chest pain.  Gastrointestinal: Positive for abdominal pain and diarrhea. Negative for constipation, nausea and vomiting.  Genitourinary: Negative for dysuria.  Musculoskeletal: Negative for joint pain.  Neurological: Negative for dizziness and headaches.   Exam: Physical Exam  Constitutional: She is oriented to person, place, and time.  HENT:  Nose: No mucosal edema.  Mouth/Throat: No oropharyngeal exudate or posterior oropharyngeal edema.  Eyes: Pupils are equal, round, and reactive to light. Conjunctivae, EOM and lids are normal.  Neck: No JVD present. Carotid bruit is not present. No edema present. No thyroid mass and no thyromegaly present.  Cardiovascular: S1 normal and S2 normal. Exam reveals no gallop.  Murmur heard.  Systolic murmur is present with a grade of 2/6. Pulses:      Dorsalis pedis pulses are 2+ on the right side, and 2+ on the left side.  Respiratory: No respiratory distress. She has decreased breath sounds in the right lower field. She has no wheezes. She has rhonchi in the right lower field and the left lower field. She  has no rales.  GI: Soft. Bowel sounds are normal. There is no tenderness.  Musculoskeletal:       Right ankle: She exhibits no swelling.       Left ankle: She exhibits no swelling.  Lymphadenopathy:    She has no cervical adenopathy.  Neurological: She is alert and oriented to person, place, and time. No cranial nerve deficit.  Skin: Skin is warm. Nails show no clubbing.  Psychiatric: She has a normal mood and affect.      Data Reviewed: Basic Metabolic Panel: Recent Labs  Lab 05/24/17 1908 05/25/17 0538 05/26/17 1041  NA 134* 133*  --   K 3.8 3.8  --   CL 94* 97*  --   CO2 27 25  --   GLUCOSE 74 90  --   BUN 19 27*  --   CREATININE 2.44* 3.16*  --   CALCIUM 8.9 8.4*  --   PHOS  --   --  4.7*   Liver Function Tests: Recent Labs  Lab 05/24/17 1908  AST 39  ALT 20  ALKPHOS 132*  BILITOT 1.7*  PROT 7.3  ALBUMIN 3.5   CBC: Recent Labs  Lab 05/24/17 1908 05/25/17 0538  WBC 6.7 6.2  NEUTROABS 5.0  --   HGB 13.0 11.5*  HCT 39.1 34.3*  MCV 90.1 90.3  PLT 203 196   Cardiac Enzymes: Recent Labs  Lab 05/24/17 1908  TROPONINI 0.12*     Recent Results (from the past 240 hour(s))  Blood culture (routine x 2)     Status: None (Preliminary result)   Collection Time: 05/24/17  7:08 PM  Result Value Ref Range Status   Specimen Description BLOOD L FA  Final   Special Requests   Final    BOTTLES DRAWN AEROBIC AND ANAEROBIC Blood Culture adequate volume   Culture   Final    NO GROWTH 2 DAYS Performed at [facility], [address] [place], [place] [address]    Report Status PENDING  Incomplete  Blood culture (routine x 2)     Status: None (Preliminary result)   Collection Time: 05/24/17  7:11 PM  Result Value Ref Range Status   Specimen Description BLOOD BLOOD LEFT WRIST  Final   Special Requests   Final    BOTTLES DRAWN AEROBIC AND ANAEROBIC Blood Culture adequate volume   Culture   Final    NO GROWTH 2 DAYS Performed at [facility],  [address], [place], [place] [address]    Report Status PENDING  Incomplete  MRSA PCR Screening     Status: Abnormal   Collection Time: 05/25/17  5:02 AM  Result Value Ref Range Status   MRSA by PCR POSITIVE (A) NEGATIVE Final    Comment:        The GeneXpert MRSA Assay (FDA approved for NASAL specimens only), is one component of a comprehensive MRSA colonization surveillance program. It is not intended to diagnose MRSA infection nor to guide or monitor treatment for MRSA infections. RESULT CALLED TO, READ BACK BY AND VERIFIED WITH: MARCELLA TURNER AT [place] ON 05/25/2017 JJB Performed at [facility], [address] [place], [place] [address]   Body fluid culture     Status: None (Preliminary result)   Collection Time: 05/25/17  3:15 PM  Result Value Ref Range Status   Specimen Description   Final    PLEURAL Performed at [facility], [address] [place], [place] [address]    Special Requests   Final    NONE Performed at [facility], [place], [place] [address]    Gram Stain NO WBC SEEN NO ORGANISMS SEEN   Final   Culture   Final    NO GROWTH < 24 HOURS Performed at [facility], [place] [address] [place],  [address]    Report Status PENDING  Incomplete     Studies: Dg Chest 1 View  Result Date: 05/25/2017 CLINICAL DATA:  Status post right thoracentesis. EXAM: CHEST  1 VIEW COMPARISON:  05/24/2017 FINDINGS: After thoracentesis there is significant reduction in right pleural fluid volume with small amount of fluid remaining. No pneumothorax following thoracentesis. Atelectasis and/or infiltrate of the underlying right lower lung present. No edema. Stable heart size. Stable appearance of pacemaker and left-sided dialysis catheter. IMPRESSION: Significant reduction in right pleural fluid volume after thoracentesis with no pneumothorax identified. Underlying atelectasis and/or infiltrate of the right lower lung.  Electronically Signed   By: Aletta Edouard M.D.   On: 05/25/2017 16:27   Dg Chest 2 View  Result Date: 05/24/2017 CLINICAL DATA:  Tan sputum with lethargy EXAM: CHEST - 2 VIEW COMPARISON:  04/21/2017, 04/07/2017, 02/13/2017 FINDINGS: Moderate to large right pleural effusion. Atelectasis or pneumonia at the right middle lobe and right base. Partial atelectasis medial left base. Cardiomegaly with vascular congestion. Aortic atherosclerosis. No pneumothorax. Postsurgical changes of the right clavicle. Spine appears osteopenic with stable mild midthoracic wedging. IMPRESSION: 1. Moderate to large right  pleural effusion with atelectasis or pneumonia at the right middle lobe and right base. 2. Cardiomegaly with vascular congestion. Electronically Signed   By: Donavan Foil M.D.   On: 05/24/2017 18:35   US Thoracentesis Asp Pleural Space W/img Guide  Result Date: 05/25/2017 CLINICAL DATA:  Right pleural effusion. EXAM: ULTRASOUND GUIDED RIGHT THORACENTESIS COMPARISON:  Chest x-ray on 05/24/2017 PROCEDURE: An ultrasound guided thoracentesis was thoroughly discussed with the patient's brother and questions answered. The benefits, risks, alternatives and complications were also discussed. The patient's brother understands and wishes to proceed with the procedure. Written consent was obtained. Ultrasound was performed to localize and mark an adequate pocket of fluid in the right chest. The area was then prepped and draped in the normal sterile fashion. 1% Lidocaine was used for local anesthesia. Under ultrasound guidance a 6 French Safe-T-Centesis catheter was introduced. Thoracentesis was performed. The catheter was removed and a dressing applied. COMPLICATIONS: None FINDINGS: A total of approximately 1.4 L of dark, yellow fluid was removed. A fluid sample was sent for laboratory analysis. IMPRESSION: Successful ultrasound guided right thoracentesis yielding 1.4 L of pleural fluid. Electronically Signed   By: Aletta Edouard M.D.   On: 05/25/2017 14:52    Scheduled Meds: . B-complex with vitamin C  1 tablet Oral Daily  . Chlorhexidine Gluconate Cloth  6 each Topical Q0600  . feeding supplement  1 Container Oral TID BM  . feeding supplement (PRO-STAT SUGAR FREE 64)  30 mL Oral BID  . heparin  5,000 Units Subcutaneous Q8H  . levothyroxine  25 mcg Oral QAC breakfast  . metoprolol succinate  25 mg Oral Daily  . multivitamin  1 tablet Oral Daily  . multivitamin-lutein  1 capsule Oral Daily  . mupirocin ointment  1 application Nasal BID  . ramipril  2.5 mg Oral QHS  . sertraline  150 mg Oral QHS   Continuous Infusions: . ceFEPime (MAXIPIME) IV Stopped (05/25/17 2355)  . vancomycin Stopped (05/26/17 1326)    Assessment/Plan:  1. Healthcare associated pneumonia started on vancomycin and cefepime 2. Large right pleural effusion.  Status post yesterday which thoracentesis removing 1.4 L.   consulted cardiothoracic surgery for repeated pleural effusion. 3. Acute encephalopathy seems to have improved. 4. End-stage renal disease on dialysis as per hemodialysis 5. Acute on chronic systolic congestive heart failure.  Limited with medication secondary to hypotension.  Patient on low-dose metoprolol and ramipril. 6. Hypothyroidism unspecified on levothyroxine 7. Malnutrition on Ensure 8. Depression on Zoloft 9. Diarrhea send off stool studies  Code Status:     Code Status Orders  (From admission, onward)        Start     Ordered   05/24/17 2259  Do not attempt resuscitation (DNR)  Continuous    Question Answer Comment  In the event of cardiac or respiratory ARREST Do not call a "code blue"   In the event of cardiac or respiratory ARREST Do not perform Intubation, CPR, defibrillation or ACLS   In the event of cardiac or respiratory ARREST Use medication by any route, position, wound care, and other measures to relive pain and suffering. May use oxygen, suction and manual treatment of airway  obstruction as needed for comfort.      05/24/17 2258    Code Status History    Date Active Date Inactive Code Status Order ID Comments User Context   02/16/2017 1928 02/17/2017 1949 Full Code [id number]  Corky Mull, MD Inpatient   02/03/2017 1230 02/04/2017  1907 DNR [id number]  Nicholes Mango, MD ED   01/01/2017 2027 01/08/2017 1858 Full Code [id number]  [name], MD ED   05/12/2016 0809 05/14/2016 1439 DNR [id number]  Max Sane, MD Inpatient   05/10/2016 0506 05/12/2016 0809 Full Code [id number]  Saundra Shelling, MD Inpatient   12/15/2015 1515 12/19/2015 0159 Full Code [id number]  Oletta Cohn, DO Inpatient   12/14/2015 1613 12/15/2015 1515 Full Code [id number]  Bettey Costa, MD Inpatient    Advance Directive Documentation     Most Recent Value  Type of Advance Directive  Healthcare Power of Attorney  Pre-existing out of facility DNR order (yellow form or pink MOST form)  -  "MOST" Form in Place?  -     Disposition Plan: To be determined based on clinical course  Consultants:  Nephrology  Procedures:  Right-sided thoracentesis  Antibiotics:  Vancomycin  Cefepime  Time spent: 28 minutes  [place]

## 2017-05-26 NOTE — Progress Notes (Signed)
Patient remained in her room for dialysis.  Hemodialysis is complete.  Patient is sleeping

## 2017-05-26 NOTE — Progress Notes (Addendum)
c.diff positive.  Dr Vianne Bulls notified

## 2017-05-26 NOTE — Progress Notes (Signed)
Pre HD assessment    05/26/17 1005  Neurological  Level of Consciousness Alert  Orientation Level Oriented X4  Respiratory  Respiratory Pattern Regular;Unlabored  Chest Assessment Chest expansion symmetrical  Bilateral Breath Sounds Clear;Diminished  R Upper  Breath Sounds Clear  L Upper Breath Sounds Clear  R Lower Breath Sounds Diminished  L Lower Breath Sounds Diminished  Cough Productive  Sputum Amount Small  Sputum Consistency Thick  Vascular  R Radial Pulse +2  L Radial Pulse +2  Integumentary  Integumentary (WDL) X  Skin Color Appropriate for ethnicity  Musculoskeletal  Musculoskeletal (WDL) X  Generalized Weakness Yes  Assistive Device [facility]  Gastrointestinal  Bowel Sounds Assessment Active  GU Assessment  Genitourinary (WDL) X  Genitourinary Symptoms  (HD)  Psychosocial  Psychosocial (WDL) WDL

## 2017-05-26 NOTE — Progress Notes (Signed)
PT Cancellation Note  Patient Details [name] MRN: [id number] DOB: 12-01-1926   Cancelled Treatment:    Reason Eval/Treat Not Completed: Fatigue/lethargy limiting ability to participate; Pt had afternoon HD and was very lethargic during attempt at PT evaluation with very little verbal communication and would not open her eyes.  Will attempt to see pt at a future date as medically appropriate.     Linus Salmons PT, DPT 05/26/17, 3:49 PM

## 2017-05-26 NOTE — Progress Notes (Signed)
Dr Leslye Peer has requested that stool be collected for lab

## 2017-05-26 NOTE — Progress Notes (Signed)
HD tx end. Tx ended 10 minutes early r/t venous chamber clotting. Pt able to be rinsed back, MD aware.   05/26/17 1340  Vital Signs  Pulse Rate 66  Pulse Rate Source Monitor  Resp 16  BP (!) 140/48  BP Location Left Arm  BP Method Automatic  Patient Position (if appropriate) Lying  Oxygen Therapy  SpO2 100 %  O2 Device Room Air  During Hemodialysis Assessment  Dialysis Fluid Bolus Normal Saline  Bolus Amount (mL) 250 mL  Intra-Hemodialysis Comments Tx completed

## 2017-05-26 NOTE — Progress Notes (Signed)
[name]  Daily Progress Note   Subjective  - * No surgery found *  The patient states she is breathing much better and overall feels much better.  She is seen with her brother in the room who assists with her healthcare matters.  Initially I was consulted for removal of her tunnel catheter however, today they were unable to use her fistula and therefore used her catheter for dialysis.  Her brother reports that [facility] has been able to use the fistula successfully for several weeks.  Patient denies fever chills while on dialysis.  She denies right hand pain.  There are no symptoms consistent with steal.  Objective Vitals:   05/26/17 1340 05/26/17 1346 05/26/17 1408 05/26/17 1959  BP: (!) 140/48 (!) 126/45 (!) 133/49 (!) 139/56  Pulse: 66 68 63 62  Resp: 16 18 16 18   Temp:  98.2 F (36.8 C) (!) 97.5 F (36.4 C) 98 F (36.7 C)  TempSrc:  Oral Axillary Oral  SpO2: 100% 99% 99% 97%  Weight:  94 lb 2.2 oz (42.7 kg)    Height:        Intake/Output Summary (Last 24 hours) at 05/26/2017 2115 Last data filed at 05/26/2017 1700 Gross per 24 hour  Intake 432 ml  Output -10 ml  Net 442 ml    PULM  Normal effort , no use of accessory muscles CV  No JVD, RRR Abd      No distended, nontender VASC  left IJ catheter clean dry and intact; right radiocephalic fistula good thrill good bruit   Laboratory CBC    Component Value Date/Time   WBC 6.2 05/25/2017 0538   HGB 11.5 (L) 05/25/2017 0538   HGB 12.0 04/24/2013 1557   HCT 34.3 (L) 05/25/2017 0538   HCT 35.6 04/24/2013 1557   PLT 196 05/25/2017 0538   PLT 216 04/24/2013 1557    BMET    Component Value Date/Time   NA 133 (L) 05/25/2017 0538   NA 142 04/24/2013 1557   K 3.8 05/25/2017 0538   K 3.8 04/24/2013 1557   CL 97 (L) 05/25/2017 0538   CL 106 04/24/2013 1557   CO2 25 05/25/2017 0538   CO2 32 04/24/2013 1557   GLUCOSE 90 05/25/2017 0538   GLUCOSE 109 (H) 04/24/2013 1557   BUN 27 (H)  05/25/2017 0538   BUN 40 (A) 09/03/2014   BUN 30 (H) 04/24/2013 1557   CREATININE 3.16 (H) 05/25/2017 0538   CREATININE 2.68 (H) 04/24/2013 1557   CALCIUM 8.4 (L) 05/25/2017 0538   CALCIUM 8.7 04/24/2013 1557   GFRNONAA 12 (L) 05/25/2017 0538   GFRNONAA 16 (L) 04/24/2013 1557   GFRAA 14 (L) 05/25/2017 0538   GFRAA 18 (L) 04/24/2013 1557    Assessment/Planning:   End-stage renal disease:  Patient has a left IJ catheter and a right radiocephalic fistula.  However they have been unable well here in this hospital to use the fistula successfully.  I therefore will not plan to remove the catheter at this time.  When she has been discharged from the hospital and returns to [place] we can discuss with her dialysis center whether they are successfully cannulating and then make plans for catheter removal.    Sharon [name]  05/26/2017, 9:15 PM

## 2017-05-26 NOTE — Progress Notes (Signed)
Patient ID: Sharon [name], female   DOB: 1926/08/12, 84 y.o.   MRN: [id number]  Chief Complaint  Patient presents with  . Altered Mental Status    Referred By Dr. Marquis Lunch Reason for Referral recurrent right-sided pleural effusion  HPI Location, Quality, Duration, Severity, Timing, Context, Modifying Factors, Associated Signs and Symptoms.  Sharon [name] is a 84 y.o. female.  I have seen and examined this patient.  She is an 84 year old woman who has had at least 2 right-sided thoracenteses performed within the last year.  She states that after her most recent thoracentesis she did feel better.  She does have a history of heart disease as well as renal failure on dialysis.  When she was admitted to the hospital she had a large pleural effusion on the right that was drained percutaneously and her most recent chest x-ray shows minimal pleural fluid.  The patient lives at [facility].  She has a brother and some other family members who are not all that involved in her care.  The history was obtained from the patient who was a very poor historian.  She is unable to care for herself or the activities of daily living and any permanent catheter would require substantial input from the family and the nursing staff at her facility.  I did discuss her care with our social service workers.  They are looking into whether or not she could be placed back at [facility] with a Pleurx or not.  In addition they are looking into whether or not the catheter can be drained at [facility] by the staff there are not.  In addition there is a palliative care consult that is pending.   Past Medical History:  Diagnosis Date  . Anemia   . Arthritis   . CAD (coronary artery disease)   . Cancer ([place])    skin  . Cardiomyopathy ([place])   . CHF (congestive heart failure) ([place])   . Depression   . IBS (irritable bowel syndrome) 2010  . Kidney failure July 2012   Hemodialysis 3xweek  . Kidney failure    . Meniere disease   . Meniere's disease   . Myocardial infarction ([place])   . OSA (obstructive sleep apnea)    CPAP  . Peritoneal dialysis status ([place])   . Presence of IVC filter 2019  . Presence of permanent cardiac pacemaker   . Renal insufficiency     Past Surgical History:  Procedure Laterality Date  . [place]  . ABDOMINAL HYSTERECTOMY    . ANUS SURGERY  2010  . AV FISTULA PLACEMENT Right 04/15/2016   Procedure: ARTERIOVENOUS (AV) FISTULA CREATION ( RADIOCEPHALIC ) STAGE 2;  Surgeon: Algernon Huxley, MD;  Location: [facility] ORS;  Service: Vascular;  Laterality: Right;  . CATARACT EXTRACTION  2006, 2011  . CHOLECYSTECTOMY  01/2008  . DIALYSIS/PERMA CATHETER INSERTION N/A 01/07/2017   Procedure: DIALYSIS/PERMA CATHETER INSERTION With an IVC filter placement;  Surgeon: Algernon Huxley, MD;  Location: [place] [facility];  Service: Cardiovascular;  Laterality: N/A;  . DIALYSIS/PERMA CATHETER REMOVAL N/A 08/20/2016   Procedure: Dialysis/Perma Catheter Removal;  Surgeon: Algernon Huxley, MD;  Location: [place] [facility];  Service: Cardiovascular;  Laterality: N/A;  . EYE SURGERY     cataracts bilateral  . FEMUR IM NAIL Left 12/15/2015   Procedure: INTRAMEDULLARY (IM) RETROGRADE FEMORAL NAILING;  Surgeon: Oletta Cohn, DO;  Location: [facility] ORS;  Service: Orthopedics;  Laterality: Left;  .  HIP PINNING,CANNULATED Left 05/10/2016   Procedure: CANNULATED HIP PINNING;  Surgeon: Earnestine Leys, MD;  Location: [facility] ORS;  Service: Orthopedics;  Laterality: Left;  . INSERT / REPLACE / REMOVE PACEMAKER    . INSERTION OF DIALYSIS CATHETER  07/2010  . IVC FILTER INSERTION  2019  . JOINT REPLACEMENT     left knee  . MINOR REMOVAL OF PERITONEAL DIALYSIS CATHETER  04/15/2016   Procedure: MINOR REMOVAL OF PERITONEAL DIALYSIS CATHETER;  Surgeon: Algernon Huxley, MD;  Location: [facility] ORS;  Service: Vascular;;  . ORIF CLAVICULAR FRACTURE Right 02/16/2017   Procedure: OPEN REDUCTION INTERNAL  FIXATION (ORIF) CLAVICULAR FRACTURE;  Surgeon: Corky Mull, MD;  Location: [facility] ORS;  Service: Orthopedics;  Laterality: Right;  . PACEMAKER INSERTION  2006  . PERIPHERAL VASCULAR CATHETERIZATION N/A 12/18/2015   Procedure: Dialysis/Perma Catheter Insertion;  Surgeon: Katha Cabal, MD;  Location: [place] [facility];  Service: Cardiovascular;  Laterality: N/A;  . TOTAL KNEE ARTHROPLASTY  2008   LEFT/Dr [place]    Family History  Problem Relation Age of Onset  . Cancer Mother        ? ovarian - sarcoma  . Cancer Father        Skin cancer  . Diabetes Sister   . COPD Sister   . Depression Sister   . Cancer Sister        Lung - 45 yrs old    Social History Social History   Tobacco Use  . Smoking status: Former Smoker    Last attempt to quit: 03/31/1948    Years since quitting: 69.2  . Smokeless tobacco: Never Used  Substance Use Topics  . Alcohol use: No  . Drug use: No    Allergies  Allergen Reactions  . Statins Other (See Comments)    Muscle weakness severe  . Codeine Sulfate Nausea And Vomiting  . Codeine Other (See Comments)    GI UPSET  . Effexor [Venlafaxine] Nausea Only  . Ezetimibe-Simvastatin Other (See Comments)    Muscle weakness    Current Facility-Administered Medications  Medication Dose Route Frequency Provider Last Rate Last Dose  . acetaminophen (TYLENOL) tablet 650 mg  650 mg Oral Q4H PRN Vaughan Basta, MD   650 mg at 05/25/17 1158  . B-complex with vitamin C tablet 1 tablet  1 tablet Oral Daily Vaughan Basta, MD   1 tablet at 05/25/17 0950  . ceFEPIme (MAXIPIME) 1 g in sodium chloride 0.9 % 100 mL IVPB  1 g Intravenous Q24H Vaughan Basta, MD   Stopped at 05/25/17 2355  . Chlorhexidine Gluconate Cloth 2 % PADS 6 each  6 each Topical Q0600 Loletha Grayer, MD   6 each at 05/26/17 0147  . docusate sodium (COLACE) capsule 100 mg  100 mg Oral BID PRN Vaughan Basta, MD      . feeding supplement (BOOST / RESOURCE  BREEZE) liquid 1 Container  1 Container Oral TID BM Loletha Grayer, MD   1 Container at 05/25/17 2030  . feeding supplement (PRO-STAT SUGAR FREE 64) liquid 30 mL  30 mL Oral BID Loletha Grayer, MD   30 mL at 05/25/17 2307  . guaiFENesin-dextromethorphan (ROBITUSSIN DM) 100-10 MG/5ML syrup 5 mL  5 mL Oral Q4H PRN Vaughan Basta, MD   5 mL at 05/25/17 2029  . heparin injection 5,000 Units  5,000 Units Subcutaneous Q8H Vaughan Basta, MD   5,000 Units at 05/26/17 3818  . levothyroxine (SYNTHROID, LEVOTHROID) tablet 25 mcg  25 mcg Oral  QAC breakfast Vaughan Basta, MD   25 mcg at 05/26/17 0856  . metoCLOPramide (REGLAN) tablet 5 mg  5 mg Oral Q8H PRN Vaughan Basta, MD      . metoprolol succinate (TOPROL-XL) 24 hr tablet 25 mg  25 mg Oral Daily Vaughan Basta, MD   25 mg at 05/25/17 0950  . multivitamin (RENA-VIT) tablet 1 tablet  1 tablet Oral Daily Vaughan Basta, MD   1 tablet at 05/25/17 0950  . multivitamin-lutein (OCUVITE-LUTEIN) capsule 1 capsule  1 capsule Oral Daily Wieting, Richard, MD      . mupirocin ointment (BACTROBAN) 2 % 1 application  1 application Nasal BID Loletha Grayer, MD   1 application at 78/93/81 2344  . polyethylene glycol (MIRALAX / GLYCOLAX) packet 17 g  17 g Oral Daily PRN Vaughan Basta, MD      . ramipril (ALTACE) capsule 2.5 mg  2.5 mg Oral QHS Vaughan Basta, MD   2.5 mg at 05/25/17 2306  . sertraline (ZOLOFT) tablet 150 mg  150 mg Oral QHS Vaughan Basta, MD   150 mg at 05/25/17 2306  . vancomycin (VANCOCIN) 500 mg in sodium chloride 0.9 % 100 mL IVPB  500 mg Intravenous Q M,W,F-HD Vaughan Basta, MD          Review of Systems A complete review of systems was asked and was negative except for the following positive findings significant shortness of breath relieved following thoracentesis  Blood pressure (!) 129/53, pulse (!) 59, temperature 97.8 F (36.6 C), temperature source  Oral, resp. rate 18, height 5\' 4"  (1.626 m), weight 94 lb 5.7 oz (42.8 kg), SpO2 100 %.  Physical Exam CONSTITUTIONAL:  Pleasant, well-developed, very thin elderly woman, and in no acute distress. EYES: Pupils equal and reactive to light, Sclera non-icteric EARS, NOSE, MOUTH AND THROAT:  The oropharynx was clear.  Dentition is good repair.  Oral mucosa pink and moist. LYMPH NODES:  Lymph nodes in the neck and axillae were normal RESPIRATORY:  Lungs were clear.  Normal respiratory effort without pathologic use of accessory muscles of respiration CARDIOVASCULAR: Heart was regular without murmurs.  There were no carotid bruits.  There is a pacemaker present in the left deltopectoral area as well as a PermCath on the left and a functioning AV fistula on the right. GI: The abdomen was soft, nontender, and nondistended. There were no palpable masses. There was no hepatosplenomegaly. There were normal bowel sounds in all quadrants. GU:  Rectal deferred.   MUSCULOSKELETAL:  Normal muscle strength and tone.  No clubbing or cyanosis.   SKIN:  There were no pathologic skin lesions.  There were no nodules on palpation.   Data Reviewed: Chest x-ray  I have personally reviewed the patient's imaging, laboratory findings and medical records.    Assessment    Recurrent right-sided pleural effusion in the setting of cardiovascular and renal disease    Plan    At the present time the patient does not appear capable of understanding the implications of placement of a Pleurx catheter.  Palliative care has yet to see the patient and I am awaiting their input before I contact the family for family consultation.  At the present time I did not recommend that we proceed with Pleurx catheter placement as we have not answered all the questions regarding whether or not this can be successfully performed.  I will continue to follow the patient with you.  I can also see the patient as an outpatient if need be.  Nestor Lewandowsky, MD 05/26/2017, 11:52 AM   Patient ID: Sharon [name], female   DOB: 01/14/1927, 84 y.o.   MRN: [id number]

## 2017-05-27 DIAGNOSIS — A0472 Enterocolitis due to Clostridium difficile, not specified as recurrent: Secondary | ICD-10-CM

## 2017-05-27 LAB — HEPATITIS B SURFACE ANTIBODY, QUANTITATIVE: Hepatitis B-Post: 5.5 m[IU]/mL — ABNORMAL LOW (ref >9.9)

## 2017-05-27 LAB — HEPATITIS B SURFACE ANTIGEN: HEP B S AG: NEGATIVE

## 2017-05-27 MED ORDER — DOXYCYCLINE HYCLATE 100 MG PO TABS
100.0000 mg | ORAL_TABLET | Freq: Two times a day (BID) | ORAL | Status: DC
Start: 2017-05-27 — End: 2017-05-31
  Administered 2017-05-27 – 2017-05-31 (×8): 100 mg via ORAL
  Filled 2017-05-27 (×8): qty 1

## 2017-05-27 MED ORDER — WITCH HAZEL-GLYCERIN EX PADS
MEDICATED_PAD | CUTANEOUS | Status: DC | PRN
  Administered 2017-05-28: 04:00:00 via TOPICAL
  Filled 2017-05-27: qty 100

## 2017-05-27 NOTE — Progress Notes (Signed)
Patient ID: [name], female   DOB: 25-Mar-1926, 84 y.o.   MRN: [id number]  ACP note   Patient present, permission to speak in front of family sister and other family  Diagnosis: Healthcare associated pneumonia, C. difficile colitis, right pleural effusion, end-stage renal disease, chronic systolic congestive heart failure, hypothyroidism unspecified, malnutrition, depression  Plan.  Patient is a full code.  Patient will need to be in the hospital until diarrhea settles down for his C. difficile colitis.  Short course of antibiotics for pneumonia.  Patient has been in and out of the hospital a lot recently.  I have seen her on numerous hospitalizations.  Difficult to manage her fluid secondary to hypotension.  Time spent on ACP discussion 17 minutes  Dr. Loletha Grayer

## 2017-05-27 NOTE — Progress Notes (Signed)
Patient ID: Sharon [name], female   DOB: 1926/06/05, 84 y.o.   MRN: [id number]  Patient ID: Sharon [name], female   DOB: 09-May-1926, 84 y.o.   MRN: [id number]  HISTORY: She appears more awake and alert today.  I was able to discuss with her in a meaningful fashion her expectations regarding her right-sided pleural effusion.  She states that she does feel better since the pleural effusion has been removed but is quite uncertain as to how she would like to proceed.  I have discussed her care with our social service colleagues and they will check in delivery commons to see if there is the possibility of her going home with a permanent Pleurx catheter.  Yesterday the brother who has a healthcare power of attorney stated that he did not want her to have a Pleurx catheter.  Today she is more lucid and she states the same thing.   Vitals:   05/27/17 0507 05/27/17 1111  BP: 135/61 (!) 124/50  Pulse: 63   Resp: 19   Temp: 97.6 F (36.4 C)   SpO2: 98%      EXAM:    Resp: Lungs are coarse bilaterally.  No respiratory distress, normal effort. Heart:  Regular without murmurs Abd:  Abdomen is soft, non distended and non tender. No masses are palpable.  There is no rebound and no guarding.  Neurological: Alert and oriented to person, place, and time. Coordination normal.  Skin: Skin is warm and dry. No rash noted. No diaphoretic. No erythema. No pallor.  Psychiatric: Normal mood and affect. Normal behavior. Judgment and thought content normal.    ASSESSMENT: I had a long discussion with her today regarding the options.  I did discuss with her a Pleurx catheter insertion to be managed at [facility].  She is aware of the indications and risks as well as the alternatives.   PLAN:   After an extensive discussion with the patient she has declined any further intervention at this time.  I would be happy to see her as an outpatient if she so desires.    Nestor Lewandowsky, MD

## 2017-05-27 NOTE — Clinical Social Work Note (Signed)
CSW notified at this time that physician states patient is not wanting pleurex catheter. CSW has notified [facility] but has also asked [facility] if in the future it is decided upon, if they could manage a pleurex catheter. Shela Leff MSW,LCSW [phone]

## 2017-05-27 NOTE — Evaluation (Signed)
Physical Therapy Evaluation Patient Details [name] MRN: [id number] DOB: 09-Jul-1926 Today's Date: 05/27/2017   History of Present Illness  Pt is an 84 y.o. female presenting to hospital 05/24/17 with AMS from dialysis (increased weakness, lethargy, and cough).  Pt admitted with AMS, HCAP, moderate to large R pleural effusion (s/p thoracentesis 05/25/17), severe chronic systolic CHF, and found to be (+) c-diff.  PMH includes s/p ORIF R clavicle fx 02/16/17, CHF, ESRD on HD, IBS, Meniere's disease, MI, L TKR, pacemaker.  Clinical Impression  Prior to hospital admission, pt was ambulatory with RW (CGA with transfers and ambulation with RW facility distances and walking outside); h/o multiple falls.  Pt is a long term care resident at [facility].  Currently pt is mod assist supine to sit; CGA to min assist with transfers; and CGA ambulating short distances in room with RW.  Overall pt tolerated session well but demonstrating generalized weakness.  Pt would benefit from skilled PT to address noted impairments and functional limitations (see below for any additional details).  Pt appears appropriate to return to SNF (when medically ready) with assist with all functional mobility for safety (pt had already been receiving assist for all mobility for safety d/t concerns for falls).    Follow Up Recommendations SNF    Equipment Recommendations  Rolling walker with 5" wheels    Recommendations for Other Services       Precautions / Restrictions Precautions Precautions: Fall Precaution Comments: R forearm AV fistula; L IJ HD catheter; pacemaker Restrictions Weight Bearing Restrictions: No      Mobility  Bed Mobility Overal bed mobility: Needs Assistance Bed Mobility: Supine to Sit     Supine to sit: Mod assist;HOB elevated     General bed mobility comments: assist for trunk supine to sit  Transfers Overall transfer level: Needs assistance Equipment used: Rolling walker  (2 wheeled) Transfers: Sit to/from Stand Sit to Stand: Min assist;Min guard         General transfer comment: min assist to stand from bed but CGA from [facility] positioned over toilet  Ambulation/Gait Ambulation/Gait assistance: Min guard Ambulation Distance (Feet): (10 feet bed to toilet; 20 feet toilet to chair) Assistive device: Rolling walker (2 wheeled)   Gait velocity: decreased   General Gait Details: R knee mildly flexed in R LE stance phase; decreased B step length/foot clearance/heelstrike  Stairs            Wheelchair Mobility    Modified Rankin (Stroke Patients Only)       Balance Overall balance assessment: Needs assistance Sitting-balance support: No upper extremity supported;Feet supported Sitting balance-Leahy Scale: Good Sitting balance - Comments: steady sitting reaching within BOS   Standing balance support: Single extremity supported Standing balance-Leahy Scale: Poor Standing balance comment: requires at least single UE support for standing balance                             Pertinent Vitals/Pain Pain Assessment: No/denies pain     Home Living Family/patient expects to be discharged to:: Skilled nursing facility                 Additional Comments: Resident of [facility]    Prior Function Level of Independence: Needs assistance         Comments: Physical therapist [facility]) called pt's facility when first attempting to evaluate pt and obtained the following information "MD note mentioned pt mostly  bed bound, this is not accurate.  Spoke to caregiver at [facility].  Per caregiver pt's baseline level of function includes SBA with bed mobility, CGA with transfers, and CGA with amb with a RW facility distances including walking outside.  Pt has fallen around 5x in the last 6 months which is why they attempt CGA with OOB activities but pt sometimes gets up on her own and is found walking around the facility. "      Hand Dominance        Extremity/Trunk Assessment   Upper Extremity Assessment Upper Extremity Assessment: Generalized weakness    Lower Extremity Assessment Lower Extremity Assessment: Generalized weakness       Communication   Communication: HOH  Cognition Arousal/Alertness: Awake/alert Behavior During Therapy: WFL for tasks assessed/performed Overall Cognitive Status: Within Functional Limits for tasks assessed                                        General Comments General comments (skin integrity, edema, etc.): Pt sleeping in bed upon PT arrival.  Nursing cleared pt for participation in physical therapy.  Pt agreeable to PT session.    Exercises     Assessment/Plan    PT Assessment Patient needs continued PT services  PT Problem List Decreased strength;Decreased activity tolerance;Decreased balance;Decreased mobility       PT Treatment Interventions DME instruction;Gait training;Functional mobility training;Therapeutic activities;Therapeutic exercise;Balance training;Patient/family education    PT Goals (Current goals can be found in the Care Plan section)  Acute Rehab PT Goals Patient Stated Goal: to be able to walk more PT Goal Formulation: With patient Time For Goal Achievement: 06/10/17 Potential to Achieve Goals: Good    Frequency Min 2X/week   Barriers to discharge        Co-evaluation               AM-PAC PT "6 Clicks" Daily Activity  Outcome Measure Difficulty turning over in bed (including adjusting bedclothes, sheets and blankets)?: Unable Difficulty moving from lying on back to sitting on the side of the bed? : Unable Difficulty sitting down on and standing up from a chair with arms (e.g., wheelchair, bedside commode, etc,.)?: Unable Help needed moving to and from a bed to chair (including a wheelchair)?: A Little Help needed walking in hospital room?: A Little Help needed climbing 3-5 steps with a railing? : A  Little 6 Click Score: 12    End of Session Equipment Utilized During Treatment: Gait belt Activity Tolerance: Patient tolerated treatment well Patient left: in chair;with call bell/phone within reach;with chair alarm set Nurse Communication: Mobility status;Precautions PT Visit Diagnosis: Other abnormalities of gait and mobility (R26.89);Muscle weakness (generalized) (M62.81);History of falling (Z91.81);Difficulty in walking, not elsewhere classified (R26.2)    Time: 8338-2505 PT Time Calculation (min) (ACUTE ONLY): 33 min   Charges:   PT Evaluation $PT Eval Low Complexity: 1 Low PT Treatments $Therapeutic Activity: 8-22 mins   PT G CodesLeitha Bleak, PT 05/27/17, 4:07 PM [phone]

## 2017-05-27 NOTE — Progress Notes (Signed)
[facility], [place] 05/27/17  Subjective:   Patient admitted from the dialysis unit for decreased responsiveness; Evaluation in the emergency room shows large right pleural effusion and possible pneumonia.   Patient had thoracentesis this admission.  1400 cc of fluid was removed Today, she states that she is able to breathe better.  She states that her appetite is very good and has been eating all day.  Objective:  Vital signs in last 24 hours:  Temp:  [97.5 F (36.4 C)-98 F (36.7 C)] 97.9 F (36.6 C) (05/02 1200) Pulse Rate:  [62-65] 65 (05/02 1200) Resp:  [16-19] 18 (05/02 1200) BP: (122-139)/(49-61) 122/55 (05/02 1200) SpO2:  [97 %-99 %] 97 % (05/02 1200)  Weight change:  Filed Weights   05/24/17 2252 05/26/17 1000 05/26/17 1346  Weight: 92 lb 1.6 oz (41.8 kg) 94 lb 5.7 oz (42.8 kg) 94 lb 2.2 oz (42.7 kg)    Intake/Output:    Intake/Output Summary (Last 24 hours) at 05/27/2017 1348 Last data filed at 05/27/2017 0900 Gross per 24 hour  Intake 340 ml  Output 1 ml  Net 339 ml     Physical Exam: General:  Frail, elderly, lying in the bed  HEENT  anicteric, moist mucous membranes  Neck  supple  Pulm/lungs  normal breathing effort, decreased breath sounds at right base, left clear  CVS/Heart  regular, no rub  Abdomen:   Soft  Extremities:  No edema  Neurologic:  Alert, able to follow commands  Skin:  Multiple ecchymosis  Access:  Right forearm AV fistula, left IJ PermCath       Basic Metabolic Panel:  Recent Labs  Lab 05/24/17 1908 05/25/17 0538 05/26/17 1041  NA 134* 133*  --   K 3.8 3.8  --   CL 94* 97*  --   CO2 27 25  --   GLUCOSE 74 90  --   BUN 19 27*  --   CREATININE 2.44* 3.16*  --   CALCIUM 8.9 8.4*  --   PHOS  --   --  4.7*     CBC: Recent Labs  Lab 05/24/17 1908 05/25/17 0538  WBC 6.7 6.2  NEUTROABS 5.0  --   HGB 13.0 11.5*  HCT 39.1 34.3*  MCV 90.1 90.3  PLT 203 196      Lab Results  Component  Value Date   HEPBSAG Negative 05/26/2017      Microbiology:  Recent Results (from the past 240 hour(s))  Blood culture (routine x 2)     Status: None (Preliminary result)   Collection Time: 05/24/17  7:08 PM  Result Value Ref Range Status   Specimen Description BLOOD L FA  Final   Special Requests   Final    BOTTLES DRAWN AEROBIC AND ANAEROBIC Blood Culture adequate volume   Culture   Final    NO GROWTH 3 DAYS Performed at [facility], [address] [place], [place] [address]    Report Status PENDING  Incomplete  Blood culture (routine x 2)     Status: None (Preliminary result)   Collection Time: 05/24/17  7:11 PM  Result Value Ref Range Status   Specimen Description BLOOD BLOOD LEFT WRIST  Final   Special Requests   Final    BOTTLES DRAWN AEROBIC AND ANAEROBIC Blood Culture adequate volume   Culture   Final    NO GROWTH 3 DAYS Performed at [facility], [address] [place], [place] [address]  Report Status PENDING  Incomplete  MRSA PCR Screening     Status: Abnormal   Collection Time: 05/25/17  5:02 AM  Result Value Ref Range Status   MRSA by PCR POSITIVE (A) NEGATIVE Final    Comment:        The GeneXpert MRSA Assay (FDA approved for NASAL specimens only), is one component of a comprehensive MRSA colonization surveillance program. It is not intended to diagnose MRSA infection nor to guide or monitor treatment for MRSA infections. RESULT CALLED TO, READ BACK BY AND VERIFIED WITH: MARCELLA TURNER AT [place] ON 05/25/2017 JJB Performed at [facility], [address] [place], [place] [address]   Body fluid culture     Status: None (Preliminary result)   Collection Time: 05/25/17  3:15 PM  Result Value Ref Range Status   Specimen Description   Final    PLEURAL Performed at [facility], [address] [place], [place] [address]    Special Requests   Final    NONE Performed at [facility], [place], [place] [address]    Gram Stain NO WBC SEEN NO ORGANISMS SEEN   Final   Culture   Final    NO GROWTH 2 DAYS Performed at [facility], [place] [address] [place], [place] [address]    Report Status PENDING  Incomplete  Gastrointestinal Panel by PCR , Stool     Status: None   Collection Time: 05/26/17  5:05 PM  Result Value Ref Range Status   Campylobacter species NOT DETECTED NOT DETECTED Final   Plesimonas shigelloides NOT DETECTED NOT DETECTED Final   Salmonella species NOT DETECTED NOT DETECTED Final   Yersinia enterocolitica NOT DETECTED NOT DETECTED Final   Vibrio species NOT DETECTED NOT DETECTED Final   Vibrio cholerae NOT DETECTED NOT DETECTED Final   Enteroaggregative E coli (EAEC) NOT DETECTED NOT DETECTED Final   Enteropathogenic E coli (EPEC) NOT DETECTED NOT DETECTED Final   Enterotoxigenic E coli (ETEC) NOT DETECTED NOT DETECTED Final   Shiga like toxin producing E coli (STEC) NOT DETECTED NOT DETECTED Final   Shigella/Enteroinvasive E coli (EIEC) NOT DETECTED NOT DETECTED Final   Cryptosporidium NOT DETECTED NOT DETECTED Final   Cyclospora cayetanensis NOT DETECTED NOT DETECTED Final   Entamoeba histolytica NOT DETECTED NOT DETECTED Final   Giardia lamblia NOT DETECTED NOT DETECTED Final   Adenovirus F40/41 NOT DETECTED NOT DETECTED Final   Astrovirus NOT DETECTED NOT DETECTED Final   Norovirus GI/GII NOT DETECTED NOT DETECTED Final   Rotavirus A NOT DETECTED NOT DETECTED Final   Sapovirus (I, II, IV, and V) NOT DETECTED NOT DETECTED Final    Comment: Performed at [facility], [address] [place], [place] [address]  C difficile quick scan w PCR reflex     Status: Abnormal   Collection Time: 05/26/17  5:05 PM  Result Value Ref Range Status   C Diff antigen POSITIVE (A) NEGATIVE Final   C Diff toxin NEGATIVE NEGATIVE Final   C Diff interpretation Results are indeterminate. See PCR results.  Final    Comment: Performed at [facility], [address] [place], [place] [address]  C. Diff by PCR, Reflexed     Status: Abnormal   Collection Time: 05/26/17  5:05 PM  Result Value Ref Range Status   Toxigenic C. Difficile by PCR POSITIVE (A) NEGATIVE Final    Comment: Positive for toxigenic C. difficile with little to no  toxin production. Only treat if clinical presentation suggests symptomatic illness. Performed at [facility], [address] [place], [place] [address]     Coagulation Studies: No results for input(s): LABPROT, INR in the last 72 hours.  Urinalysis: No results for input(s): COLORURINE, LABSPEC, PHURINE, GLUCOSEU, HGBUR, BILIRUBINUR, KETONESUR, PROTEINUR, UROBILINOGEN, NITRITE, LEUKOCYTESUR in the last 72 hours.  Invalid input(s): APPERANCEUR    Imaging: Dg Chest 1 View  Result Date: 05/25/2017 CLINICAL DATA:  Status post right thoracentesis. EXAM: CHEST  1 VIEW COMPARISON:  05/24/2017 FINDINGS: After thoracentesis there is significant reduction in right pleural fluid volume with small amount of fluid remaining. No pneumothorax following thoracentesis. Atelectasis and/or infiltrate of the underlying right lower lung present. No edema. Stable heart size. Stable appearance of pacemaker and left-sided dialysis catheter. IMPRESSION: Significant reduction in right pleural fluid volume after thoracentesis with no pneumothorax identified. Underlying atelectasis and/or infiltrate of the right lower lung. Electronically Signed   By: Aletta Edouard M.D.   On: 05/25/2017 16:27   US Thoracentesis Asp Pleural Space W/img Guide  Result Date: 05/25/2017 CLINICAL DATA:  Right pleural effusion. EXAM: ULTRASOUND GUIDED RIGHT THORACENTESIS COMPARISON:  Chest x-ray on 05/24/2017 PROCEDURE: An ultrasound guided thoracentesis was thoroughly discussed with the patient's brother and questions answered. The benefits, risks, alternatives and complications were also discussed. The patient's brother understands  and wishes to proceed with the procedure. Written consent was obtained. Ultrasound was performed to localize and mark an adequate pocket of fluid in the right chest. The area was then prepped and draped in the normal sterile fashion. 1% Lidocaine was used for local anesthesia. Under ultrasound guidance a 6 French Safe-T-Centesis catheter was introduced. Thoracentesis was performed. The catheter was removed and a dressing applied. COMPLICATIONS: None FINDINGS: A total of approximately 1.4 L of dark, yellow fluid was removed. A fluid sample was sent for laboratory analysis. IMPRESSION: Successful ultrasound guided right thoracentesis yielding 1.4 L of pleural fluid. Electronically Signed   By: Aletta Edouard M.D.   On: 05/25/2017 14:52     Medications:   . ceFEPime (MAXIPIME) IV Stopped (05/26/17 2320)  . vancomycin Stopped (05/26/17 1326)   . B-complex with vitamin C  1 tablet Oral Daily  . Chlorhexidine Gluconate Cloth  6 each Topical Q0600  . feeding supplement  1 Container Oral TID BM  . feeding supplement (PRO-STAT SUGAR FREE 64)  30 mL Oral BID  . heparin  5,000 Units Subcutaneous Q8H  . levothyroxine  25 mcg Oral QAC breakfast  . metoprolol succinate  25 mg Oral Daily  . multivitamin  1 tablet Oral Daily  . multivitamin-lutein  1 capsule Oral Daily  . mupirocin ointment  1 application Nasal BID  . ramipril  2.5 mg Oral QHS  . sertraline  150 mg Oral QHS  . vancomycin  125 mg Oral Q6H   acetaminophen, docusate sodium, guaiFENesin-dextromethorphan, metoCLOPramide, polyethylene glycol  Assessment/ Plan:  84 y.o. female  with end stage renal disease on hemodialysis, coronary artery disease, pacemaker placement, meniere's disease, congestive heart failure, peripheral vascular disease, multiple falls and fractures.   MWF [facility]   1.  End-stage renal disease 2.  Anemia of chronic kidney disease 3.  Secondary hyperparathyroidism 4.  Right pleural effusion, possible  pneumonia  Plan: Hemodialysis today to follow MWF schedule PermCath removal as outpatient, as AV cannulation was not successful inpatient Right chest thoracentesis - 1400 cc removed. Able to breath better.  Patient declined Pleurx catheter Palliative team will  be following  Monitor Hgb and Phos Next hemodialysis planned for Friday    LOS: 3 Messiah Ahr 5/2/20191:48 PM  [facility] [place], [place]  Note: This note was prepared with Dragon dictation. Any transcription errors are unintentional

## 2017-05-27 NOTE — Progress Notes (Addendum)
Spoke on the phone with patient's brother for approximately 30 minutes.  He called with questions regarding patient's C diff positive test result.  Also discussed her prognosis.  Patient is at high risk to die of an acute event, otherwise she likely has weeks to months given recurrent infections, frailty and decline.   Ed understands and agrees.  At this point he still feels that she is unable to come home (there is no family available to care for her 24 hours per day).  I met with Sharon [name] briefly at bedside.  Her sister is visiting.  It was important to Sharon [name] to be able to visit with her sister and say things that needed to be said.   Sharon [name] feels good despite having diarrhea. We briefly discussed C-diff.  She is eating well.  She still does not want to return to [facility].    I left to give Sharon [name] privacy to speak with her sister.  PMT will continue to follow with you.    Florentina Jenny, PA-C Palliative Medicine Pager: [phone]  35 min

## 2017-05-27 NOTE — Progress Notes (Signed)
Patient ID: Sharon [name], female   DOB: Dec 05, 1926, 84 y.o.   MRN: [id number]  [employer] PROGRESS NOTE  LUNDYN COSTE OQH:[id number] DOB: May 08, 1926 DOA: 05/24/2017 PCP: Kirk Ruths, MD  HPI/Subjective: Patient had 3 episodes of diarrhea when I saw her this morning.  Patient feeling better with regards to her breathing and coughing and shortness of breath.  Objective: Vitals:   05/27/17 1111 05/27/17 1200  BP: (!) 124/50 (!) 122/55  Pulse:  65  Resp:  18  Temp:  97.9 F (36.6 C)  SpO2:  97%    Filed Weights   05/24/17 2252 05/26/17 1000 05/26/17 1346  Weight: 41.8 kg (92 lb 1.6 oz) 42.8 kg (94 lb 5.7 oz) 42.7 kg (94 lb 2.2 oz)    ROS: Review of Systems  Constitutional: Negative for chills and fever.  Eyes: Negative for blurred vision.  Respiratory: Positive for cough and shortness of breath.   Cardiovascular: Negative for chest pain.  Gastrointestinal: Positive for abdominal pain and diarrhea. Negative for constipation, nausea and vomiting.  Genitourinary: Negative for dysuria.  Musculoskeletal: Negative for joint pain.  Neurological: Negative for dizziness and headaches.   Exam: Physical Exam  Constitutional: She is oriented to person, place, and time.  HENT:  Nose: No mucosal edema.  Mouth/Throat: No oropharyngeal exudate or posterior oropharyngeal edema.  Eyes: Pupils are equal, round, and reactive to light. Conjunctivae, EOM and lids are normal.  Neck: No JVD present. Carotid bruit is not present. No edema present. No thyroid mass and no thyromegaly present.  Cardiovascular: S1 normal and S2 normal. Exam reveals no gallop.  Murmur heard.  Systolic murmur is present with a grade of 2/6. Pulses:      Dorsalis pedis pulses are 2+ on the right side, and 2+ on the left side.  Respiratory: No respiratory distress. She has decreased breath sounds in the right lower field. She has no wheezes. She has rhonchi in the right lower field and the left lower  field. She has no rales.  GI: Soft. Bowel sounds are normal. There is no tenderness.  Musculoskeletal:       Right ankle: She exhibits no swelling.       Left ankle: She exhibits no swelling.  Lymphadenopathy:    She has no cervical adenopathy.  Neurological: She is alert and oriented to person, place, and time. No cranial nerve deficit.  Skin: Skin is warm. Nails show no clubbing.  Psychiatric: She has a normal mood and affect.      Data Reviewed: Basic Metabolic Panel: Recent Labs  Lab 05/24/17 1908 05/25/17 0538 05/26/17 1041  NA 134* 133*  --   K 3.8 3.8  --   CL 94* 97*  --   CO2 27 25  --   GLUCOSE 74 90  --   BUN 19 27*  --   CREATININE 2.44* 3.16*  --   CALCIUM 8.9 8.4*  --   PHOS  --   --  4.7*   Liver Function Tests: Recent Labs  Lab 05/24/17 1908  AST 39  ALT 20  ALKPHOS 132*  BILITOT 1.7*  PROT 7.3  ALBUMIN 3.5   CBC: Recent Labs  Lab 05/24/17 1908 05/25/17 0538  WBC 6.7 6.2  NEUTROABS 5.0  --   HGB 13.0 11.5*  HCT 39.1 34.3*  MCV 90.1 90.3  PLT 203 196   Cardiac Enzymes: Recent Labs  Lab 05/24/17 1908  TROPONINI 0.12*     Recent Results (from  the past 240 hour(s))  Blood culture (routine x 2)     Status: None (Preliminary result)   Collection Time: 05/24/17  7:08 PM  Result Value Ref Range Status   Specimen Description BLOOD L FA  Final   Special Requests   Final    BOTTLES DRAWN AEROBIC AND ANAEROBIC Blood Culture adequate volume   Culture   Final    NO GROWTH 3 DAYS Performed at [facility], [address] [place], [place] [address]    Report Status PENDING  Incomplete  Blood culture (routine x 2)     Status: None (Preliminary result)   Collection Time: 05/24/17  7:11 PM  Result Value Ref Range Status   Specimen Description BLOOD BLOOD LEFT WRIST  Final   Special Requests   Final    BOTTLES DRAWN AEROBIC AND ANAEROBIC Blood Culture adequate volume   Culture   Final    NO GROWTH 3 DAYS Performed at [facility], [address] [place], [place] [address]    Report Status PENDING  Incomplete  MRSA PCR Screening     Status: Abnormal   Collection Time: 05/25/17  5:02 AM  Result Value Ref Range Status   MRSA by PCR POSITIVE (A) NEGATIVE Final    Comment:        The GeneXpert MRSA Assay (FDA approved for NASAL specimens only), is one component of a comprehensive MRSA colonization surveillance program. It is not intended to diagnose MRSA infection nor to guide or monitor treatment for MRSA infections. RESULT CALLED TO, READ BACK BY AND VERIFIED WITH: MARCELLA TURNER AT [place] ON 05/25/2017 JJB Performed at [facility], [address] [place], [place] [address]   Body fluid culture     Status: None (Preliminary result)   Collection Time: 05/25/17  3:15 PM  Result Value Ref Range Status   Specimen Description   Final    PLEURAL Performed at [facility], [address] [place], [place] [address]    Special Requests   Final    NONE Performed at [facility], [place], [place] [address]    Gram Stain NO WBC SEEN NO ORGANISMS SEEN   Final   Culture   Final    NO GROWTH 2 DAYS Performed at [facility], [place] [address] [place],  [address]    Report Status PENDING  Incomplete  Gastrointestinal Panel by PCR , Stool     Status: None   Collection Time: 05/26/17  5:05 PM  Result Value Ref Range Status   Campylobacter species NOT DETECTED NOT DETECTED Final   Plesimonas shigelloides NOT DETECTED NOT DETECTED Final   Salmonella species NOT DETECTED NOT DETECTED Final   Yersinia enterocolitica NOT DETECTED NOT DETECTED Final   Vibrio species NOT DETECTED NOT DETECTED Final   Vibrio cholerae NOT DETECTED NOT DETECTED Final   Enteroaggregative E coli (EAEC) NOT DETECTED NOT DETECTED Final   Enteropathogenic E coli (EPEC) NOT DETECTED NOT DETECTED Final   Enterotoxigenic E coli (ETEC) NOT DETECTED NOT DETECTED Final   Shiga  like toxin producing E coli (STEC) NOT DETECTED NOT DETECTED Final   Shigella/Enteroinvasive E coli (EIEC) NOT DETECTED NOT DETECTED Final   Cryptosporidium NOT DETECTED NOT DETECTED Final   Cyclospora cayetanensis NOT DETECTED NOT DETECTED Final   Entamoeba histolytica NOT DETECTED NOT DETECTED Final   Giardia lamblia NOT DETECTED NOT DETECTED Final   Adenovirus F40/41 NOT DETECTED NOT DETECTED Final  Astrovirus NOT DETECTED NOT DETECTED Final   Norovirus GI/GII NOT DETECTED NOT DETECTED Final   Rotavirus A NOT DETECTED NOT DETECTED Final   Sapovirus (I, II, IV, and V) NOT DETECTED NOT DETECTED Final    Comment: Performed at [facility], [address] [place], [place] [address]  C difficile quick scan w PCR reflex     Status: Abnormal   Collection Time: 05/26/17  5:05 PM  Result Value Ref Range Status   C Diff antigen POSITIVE (A) NEGATIVE Final   C Diff toxin NEGATIVE NEGATIVE Final   C Diff interpretation Results are indeterminate. See PCR results.  Final    Comment: Performed at [facility], [address] [place], [place] [address]  C. Diff by PCR, Reflexed     Status: Abnormal   Collection Time: 05/26/17  5:05 PM  Result Value Ref Range Status   Toxigenic C. Difficile by PCR POSITIVE (A) NEGATIVE Final    Comment: Positive for toxigenic C. difficile with little to no toxin production. Only treat if clinical presentation suggests symptomatic illness. Performed at [facility], [address] [place], [place] [address]       Scheduled Meds: . B-complex with vitamin C  1 tablet Oral Daily  . Chlorhexidine Gluconate Cloth  6 each Topical Q0600  . feeding supplement  1 Container Oral TID BM  . feeding supplement (PRO-STAT SUGAR FREE 64)  30 mL Oral BID  . heparin  5,000 Units Subcutaneous Q8H  . levothyroxine  25 mcg Oral QAC breakfast  . metoprolol succinate  25 mg Oral Daily  . multivitamin  1 tablet Oral Daily  . multivitamin-lutein  1  capsule Oral Daily  . mupirocin ointment  1 application Nasal BID  . ramipril  2.5 mg Oral QHS  . sertraline  150 mg Oral QHS  . vancomycin  125 mg Oral Q6H   Continuous Infusions: . ceFEPime (MAXIPIME) IV Stopped (05/26/17 2320)  . vancomycin Stopped (05/26/17 1326)    Assessment/Plan:  1. Healthcare associated pneumonia started on vancomycin and cefepime.  I will DC vancomycin and switch to doxycycline.  Blood cultures negative x2.  Thoracentesis culture negative.  We will give a short course of antibiotics because of her C. difficile being positive. 2. C. difficile colitis.  Oral vancomycin 3. Large right pleural effusion.  Status post thoracentesis removing 1.4 L.   So far cultures are negative. 4. Acute encephalopathy seems to have improved. 5. End-stage renal disease on dialysis as per hemodialysis 6. Acute on chronic systolic congestive heart failure.  Limited with medication secondary to hypotension.  Patient on low-dose metoprolol and ramipril. 7. Hypothyroidism unspecified on levothyroxine 8. Malnutrition on Ensure 9. Depression on Zoloft  Code Status:     Code Status Orders  (From admission, onward)        Start     Ordered   05/24/17 2259  Do not attempt resuscitation (DNR)  Continuous    Question Answer Comment  In the event of cardiac or respiratory ARREST Do not call a "code blue"   In the event of cardiac or respiratory ARREST Do not perform Intubation, CPR, defibrillation or ACLS   In the event of cardiac or respiratory ARREST Use medication by any route, position, wound care, and other measures to relive pain and suffering. May use oxygen, suction and manual treatment of airway obstruction as needed for comfort.      05/24/17 2258    Code Status History  Date Active Date Inactive Code Status Order ID Comments User Context   02/16/2017 1928 02/17/2017 1949 Full Code [id number]  Corky Mull, MD Inpatient   02/03/2017 1230 02/04/2017 1907 DNR [id number]  Nicholes Mango, MD ED   01/01/2017 2027 01/08/2017 1858 Full Code [id number]  [name], MD ED   05/12/2016 0809 05/14/2016 1439 DNR [id number]  Max Sane, MD Inpatient   05/10/2016 0506 05/12/2016 0809 Full Code [id number]  Saundra Shelling, MD Inpatient   12/15/2015 1515 12/19/2015 0159 Full Code [id number]  Oletta Cohn, DO Inpatient   12/14/2015 1613 12/15/2015 1515 Full Code [id number]  Bettey Costa, MD Inpatient    Advance Directive Documentation     Most Recent Value  Type of Advance Directive  Healthcare Power of Attorney  Pre-existing out of facility DNR order (yellow form or pink MOST form)  -  "MOST" Form in Place?  -     Disposition Plan:  diarrhea will need to settle down prior to disposition  Consultants:  Nephrology  Cardiothoracic surgery  Palliative care  Procedures:  Right-sided thoracentesis  Antibiotics:  doxycycline  Cefepime  Oral vancomycin  Time spent: 30 minutes, including ACP time  [employer]

## 2017-05-28 MED ORDER — AMOXICILLIN-POT CLAVULANATE 500-125 MG PO TABS
500.0000 mg | ORAL_TABLET | Freq: Every day | ORAL | Status: DC
  Administered 2017-05-29 – 2017-05-31 (×4): 500 mg via ORAL
  Filled 2017-05-28 (×5): qty 1

## 2017-05-28 NOTE — Progress Notes (Signed)
Pre HD assessment    05/28/17 2000  Neurological  Level of Consciousness Alert  Orientation Level Oriented X4  Respiratory  Respiratory Pattern Regular;Unlabored  Chest Assessment Chest expansion symmetrical  Cough Productive  Vascular  R Radial Pulse +2  L Radial Pulse +2  Integumentary  Integumentary (WDL) X  Skin Color Appropriate for ethnicity  Musculoskeletal  Musculoskeletal (WDL) X  Generalized Weakness Yes  Assistive Device Front wheel walker  Gastrointestinal  Bowel Sounds Assessment Active  GU Assessment  Genitourinary (WDL) X  Genitourinary Symptoms  (HD)  Psychosocial  Psychosocial (WDL) WDL

## 2017-05-28 NOTE — Progress Notes (Signed)
Post HD assessment    05/28/17 2337  Neurological  Level of Consciousness Responds to Voice  Orientation Level Oriented X4  Respiratory  Respiratory Pattern Regular;Unlabored  Chest Assessment Chest expansion symmetrical  Cough Productive  Cardiac  Cardiac Rhythm SB  Vascular  R Radial Pulse +2  L Radial Pulse +2  Integumentary  Integumentary (WDL) X  Skin Color Appropriate for ethnicity  Musculoskeletal  Musculoskeletal (WDL) X  Generalized Weakness Yes  Assistive Device Front wheel walker  Gastrointestinal  Bowel Sounds Assessment Active  GU Assessment  Genitourinary (WDL) X  Genitourinary Symptoms  (HD)  Psychosocial  Psychosocial (WDL) WDL

## 2017-05-28 NOTE — Progress Notes (Signed)
Pre HD assessment    05/28/17 2015  Vital Signs  Temp 97.7 F (36.5 C)  Temp Source Oral  Pulse Rate (!) 59  Pulse Rate Source Monitor  Resp 15  BP (!) 146/61  BP Location Left Arm  BP Method Automatic  Patient Position (if appropriate) Lying  Oxygen Therapy  SpO2 100 %  O2 Device Room Air  Pain Assessment  Pain Scale 0-10  Pain Score 0  Dialysis Weight  Weight 47.3 kg (104 lb 4.4 oz)  Type of Weight Pre-Dialysis  Time-Out for Hemodialysis  What Procedure? HD  Pt Identifiers(min of two) First/Last Name;MRN/Account#  Correct Site? Yes  Correct Side? Yes  Correct Procedure? Yes  Consents Verified? Yes  Rad Studies Available? N/A  Safety Precautions Reviewed? Yes  Engineer, civil (consulting) Number  (4A)  Station Number  (Bedside, RM 219, enteric precautions )  UF/Alarm Test Passed  Conductivity: Meter 13.6  Conductivity: Machine  13.8  pH 7.4  Reverse Osmosis  (RO, [id number])  Normal Saline Lot Number [id number]  Dialyzer Lot Number [id number]  Disposable Set Lot Number [id number]  Machine Temperature 98.6 F (37 C)  Musician and Audible Yes  Blood Lines Intact and Secured Yes  Pre Treatment Patient Checks  Vascular access used during treatment Catheter  Hepatitis B Surface Antigen Results Negative  Date Hepatitis B Surface Antigen Drawn 05/26/17  Hepatitis B Surface Antibody  (<10)  Date Hepatitis B Surface Antibody Drawn 05/26/17  Hemodialysis Consent Verified Yes  Hemodialysis Standing Orders Initiated Yes  ECG (Telemetry) Monitor On Yes  Prime Ordered Normal Saline  Length of  DialysisTreatment -hour(s) 3 Hour(s)  Dialyzer Elisio 17H NR  Dialysate 3K, 2.5 Ca  Dialysis Anticoagulant None  Dialysate Flow Ordered 800  Blood Flow Rate Ordered 400 mL/min  Ultrafiltration Goal 0 Liters  Dialysis Blood Pressure Support Ordered Normal Saline  Education / Care Plan  Dialysis Education Provided Yes  Documented Education in Care Plan Yes

## 2017-05-28 NOTE — Progress Notes (Signed)
[facility], [place] 05/28/17  Subjective:   Patient admitted from the dialysis unit for decreased responsiveness; Evaluation in the emergency room shows large right pleural effusion and possible pneumonia.   Patient had thoracentesis this admission.  1400 cc of fluid was removed Today, she states that she is able to breathe better.  Sitting up in the chair eating breakfast when seen.  Appears well.  Denies any acute shortness of breath.  Reports she walked around in the room. States she had 3-4 loose bowel movements last night and last evening None this morning so far   Objective:  Vital signs in last 24 hours:  Temp:  [97.5 F (36.4 C)-98 F (36.7 C)] 98 F (36.7 C) (05/03 0543) Pulse Rate:  [63-72] 63 (05/03 0543) Resp:  [16-20] 16 (05/03 0543) BP: (122-140)/(50-68) 131/65 (05/03 0543) SpO2:  [95 %-100 %] 95 % (05/03 0543)  Weight change:  Filed Weights   05/24/17 2252 05/26/17 1000 05/26/17 1346  Weight: 92 lb 1.6 oz (41.8 kg) 94 lb 5.7 oz (42.8 kg) 94 lb 2.2 oz (42.7 kg)    Intake/Output:    Intake/Output Summary (Last 24 hours) at 05/28/2017 0920 Last data filed at 05/27/2017 2307 Gross per 24 hour  Intake 95 ml  Output -  Net 95 ml     Physical Exam: General:  Frail, elderly, sitting up in the chair beside the bed  HEENT  anicteric, moist mucous membranes  Neck  supple  Pulm/lungs  normal breathing effort, decreased breath sounds at right base, left clear  CVS/Heart  regular, no rub  Abdomen:   Soft  Extremities:  No edema  Neurologic:  Alert, able to follow commands  Skin:  Multiple ecchymosis  Access:  Right forearm AV fistula, left IJ PermCath       Basic Metabolic Panel:  Recent Labs  Lab 05/24/17 1908 05/25/17 0538 05/26/17 1041  NA 134* 133*  --   K 3.8 3.8  --   CL 94* 97*  --   CO2 27 25  --   GLUCOSE 74 90  --   BUN 19 27*  --   CREATININE 2.44* 3.16*  --   CALCIUM 8.9 8.4*  --   PHOS  --   --  4.7*      CBC: Recent Labs  Lab 05/24/17 1908 05/25/17 0538  WBC 6.7 6.2  NEUTROABS 5.0  --   HGB 13.0 11.5*  HCT 39.1 34.3*  MCV 90.1 90.3  PLT 203 196      Lab Results  Component Value Date   HEPBSAG Negative 05/26/2017      Microbiology:  Recent Results (from the past 240 hour(s))  Blood culture (routine x 2)     Status: None (Preliminary result)   Collection Time: 05/24/17  7:08 PM  Result Value Ref Range Status   Specimen Description BLOOD L FA  Final   Special Requests   Final    BOTTLES DRAWN AEROBIC AND ANAEROBIC Blood Culture adequate volume   Culture   Final    NO GROWTH 4 DAYS Performed at [facility], [address] [place], [place] [address]    Report Status PENDING  Incomplete  Blood culture (routine x 2)     Status: None (Preliminary result)   Collection Time: 05/24/17  7:11 PM  Result Value Ref Range Status   Specimen Description BLOOD BLOOD LEFT WRIST  Final   Special Requests   Final    BOTTLES  DRAWN AEROBIC AND ANAEROBIC Blood Culture adequate volume   Culture   Final    NO GROWTH 4 DAYS Performed at [facility], [address] [place], [place] [address]    Report Status PENDING  Incomplete  MRSA PCR Screening     Status: Abnormal   Collection Time: 05/25/17  5:02 AM  Result Value Ref Range Status   MRSA by PCR POSITIVE (A) NEGATIVE Final    Comment:        The GeneXpert MRSA Assay (FDA approved for NASAL specimens only), is one component of a comprehensive MRSA colonization surveillance program. It is not intended to diagnose MRSA infection nor to guide or monitor treatment for MRSA infections. RESULT CALLED TO, READ BACK BY AND VERIFIED WITH: MARCELLA TURNER AT [place] ON 05/25/2017 JJB Performed at [facility], [address] [place], [place] [address]   Body fluid culture     Status: None (Preliminary result)   Collection Time: 05/25/17  3:15 PM  Result Value Ref Range Status   Specimen Description    Final    PLEURAL Performed at [facility], [address] [place], [place] [address]    Special Requests   Final    NONE Performed at [facility], [place], [place] [address]    Gram Stain NO WBC SEEN NO ORGANISMS SEEN   Final   Culture   Final    NO GROWTH 2 DAYS Performed at [facility], [place] [address], [place], [place] [address]    Report Status PENDING  Incomplete  Gastrointestinal Panel by PCR , Stool     Status: None   Collection Time: 05/26/17  5:05 PM  Result Value Ref Range Status   Campylobacter species NOT DETECTED NOT DETECTED Final   Plesimonas shigelloides NOT DETECTED NOT DETECTED Final   Salmonella species NOT DETECTED NOT DETECTED Final   Yersinia enterocolitica NOT DETECTED NOT DETECTED Final   Vibrio species NOT DETECTED NOT DETECTED Final   Vibrio cholerae NOT DETECTED NOT DETECTED Final   Enteroaggregative E coli (EAEC) NOT DETECTED NOT DETECTED Final   Enteropathogenic E coli (EPEC) NOT DETECTED NOT DETECTED Final   Enterotoxigenic E coli (ETEC) NOT DETECTED NOT DETECTED Final   Shiga like toxin producing E coli (STEC) NOT DETECTED NOT DETECTED Final   Shigella/Enteroinvasive E coli (EIEC) NOT DETECTED NOT DETECTED Final   Cryptosporidium NOT DETECTED NOT DETECTED Final   Cyclospora cayetanensis NOT DETECTED NOT DETECTED Final   Entamoeba histolytica NOT DETECTED NOT DETECTED Final   Giardia lamblia NOT DETECTED NOT DETECTED Final   Adenovirus F40/41 NOT DETECTED NOT DETECTED Final   Astrovirus NOT DETECTED NOT DETECTED Final   Norovirus GI/GII NOT DETECTED NOT DETECTED Final   Rotavirus A NOT DETECTED NOT DETECTED Final   Sapovirus (I, II, IV, and V) NOT DETECTED NOT DETECTED Final    Comment: Performed at [facility], [address] [place], [place] [address]  C difficile quick scan w PCR reflex     Status: Abnormal   Collection Time: 05/26/17  5:05 PM  Result Value Ref Range Status   C Diff  antigen POSITIVE (A) NEGATIVE Final   C Diff toxin NEGATIVE NEGATIVE Final   C Diff interpretation Results are indeterminate. See PCR results.  Final    Comment: Performed at [facility], [address] [place], [place] [address]  C. Diff by PCR, Reflexed     Status: Abnormal  Collection Time: 05/26/17  5:05 PM  Result Value Ref Range Status   Toxigenic C. Difficile by PCR POSITIVE (A) NEGATIVE Final    Comment: Positive for toxigenic C. difficile with little to no toxin production. Only treat if clinical presentation suggests symptomatic illness. Performed at [facility], [address] [place], [place] [address]     Coagulation Studies: No results for input(s): LABPROT, INR in the last 72 hours.  Urinalysis: No results for input(s): COLORURINE, LABSPEC, PHURINE, GLUCOSEU, HGBUR, BILIRUBINUR, KETONESUR, PROTEINUR, UROBILINOGEN, NITRITE, LEUKOCYTESUR in the last 72 hours.  Invalid input(s): APPERANCEUR    Imaging: No results found.   Medications:   . ceFEPime (MAXIPIME) IV Stopped (05/27/17 2307)   . B-complex with vitamin C  1 tablet Oral Daily  . Chlorhexidine Gluconate Cloth  6 each Topical Q0600  . doxycycline  100 mg Oral Q12H  . feeding supplement  1 Container Oral TID BM  . feeding supplement (PRO-STAT SUGAR FREE 64)  30 mL Oral BID  . heparin  5,000 Units Subcutaneous Q8H  . levothyroxine  25 mcg Oral QAC breakfast  . metoprolol succinate  25 mg Oral Daily  . multivitamin  1 tablet Oral Daily  . multivitamin-lutein  1 capsule Oral Daily  . mupirocin ointment  1 application Nasal BID  . ramipril  2.5 mg Oral QHS  . sertraline  150 mg Oral QHS  . vancomycin  125 mg Oral Q6H   acetaminophen, docusate sodium, guaiFENesin-dextromethorphan, metoCLOPramide, polyethylene glycol, witch hazel-glycerin  Assessment/ Plan:  84 y.o. female  with end stage renal disease on hemodialysis, coronary artery disease, pacemaker placement, meniere's disease,  congestive heart failure, peripheral vascular disease, multiple falls and fractures.   MWF [facility]   1.  End-stage renal disease 2.  Anemia of chronic kidney disease 3.  Secondary hyperparathyroidism 4.  Right pleural effusion, possible pneumonia 5.  C. difficile colitis  Plan: Hemodialysis today to follow MWF schedule PermCath removal as outpatient, as AV cannulation was not successful inpatient Right chest thoracentesis - 1400 cc removed. Able to breath better.  Patient declined Pleurx catheter Palliative team will be following  Monitor Hgb and Phos Next hemodialysis today in the room as patient is C. difficile positive    LOS: [place] 5/3/20199:20 AM  [place], [place]  Note: This note was prepared with Dragon dictation. Any transcription errors are unintentional

## 2017-05-28 NOTE — Progress Notes (Signed)
Post HD assessment. Pt tolerated tx well without c/o or complications. Net UF 18m, goal met.    05/28/17 2339  Vital Signs  Temp 97.7 F (36.5 C)  Temp Source Oral  Pulse Rate 70  Pulse Rate Source Monitor  Resp 15  BP (!) 155/64  BP Location Left Arm  BP Method Automatic  Patient Position (if appropriate) Lying  Oxygen Therapy  SpO2 99 %  O2 Device Room Air  Dialysis Weight  Weight 47.2 kg (104 lb 0.9 oz)  Type of Weight Post-Dialysis  Post-Hemodialysis Assessment  Rinseback Volume (mL) 250 mL  KECN 67.3 V  Dialyzer Clearance Lightly streaked  Duration of HD Treatment -hour(s) 3 hour(s)  Hemodialysis Intake (mL) 500 mL  UF Total -Machine (mL) 510 mL  Net UF (mL) 10 mL  Tolerated HD Treatment Yes  Education / Care Plan  Dialysis Education Provided Yes  Documented Education in Care Plan Yes

## 2017-05-28 NOTE — Progress Notes (Signed)
Patient ID: Sharon [name], female   DOB: [birth year], 84 y.o.   MRN: [id number]   [employer] PROGRESS NOTE  LINETTE GUNDERSON WUJ:[id number] DOB: 1926/10/17 DOA: 05/24/2017 PCP: Kirk Ruths, MD  HPI/Subjective: Patient has had 3 episodes of diarrhea today.  She feels better today than she has been.  Some cough and shortness of breath.  Objective: Vitals:   05/28/17 0543 05/28/17 1223  BP: 131/65 (!) 135/54  Pulse: 63 62  Resp: 16 17  Temp: 98 F (36.7 C) 98.3 F (36.8 C)  SpO2: 95% 99%    Filed Weights   05/24/17 2252 05/26/17 1000 05/26/17 1346  Weight: 41.8 kg (92 lb 1.6 oz) 42.8 kg (94 lb 5.7 oz) 42.7 kg (94 lb 2.2 oz)    ROS: Review of Systems  Constitutional: Negative for chills and fever.  Eyes: Negative for blurred vision.  Respiratory: Positive for cough and shortness of breath.   Cardiovascular: Negative for chest pain.  Gastrointestinal: Positive for abdominal pain and diarrhea. Negative for constipation, nausea and vomiting.  Genitourinary: Negative for dysuria.  Musculoskeletal: Negative for joint pain.  Neurological: Negative for dizziness and headaches.   Exam: Physical Exam  Constitutional: She is oriented to person, place, and time.  HENT:  Nose: No mucosal edema.  Mouth/Throat: No oropharyngeal exudate or posterior oropharyngeal edema.  Eyes: Pupils are equal, round, and reactive to light. Conjunctivae, EOM and lids are normal.  Neck: No JVD present. Carotid bruit is not present. No edema present. No thyroid mass and no thyromegaly present.  Cardiovascular: S1 normal and S2 normal. Exam reveals no gallop.  Murmur heard.  Systolic murmur is present with a grade of 2/6. Pulses:      Dorsalis pedis pulses are 2+ on the right side, and 2+ on the left side.  Respiratory: No respiratory distress. She has decreased breath sounds in the right lower field. She has no wheezes. She has rhonchi in the right lower field and the left lower field. She  has no rales.  GI: Soft. Bowel sounds are normal. There is no tenderness.  Musculoskeletal:       Right ankle: She exhibits no swelling.       Left ankle: She exhibits no swelling.  Lymphadenopathy:    She has no cervical adenopathy.  Neurological: She is alert and oriented to person, place, and time. No cranial nerve deficit.  Skin: Skin is warm. Nails show no clubbing.  Psychiatric: She has a normal mood and affect.      Data Reviewed: Basic Metabolic Panel: Recent Labs  Lab 05/24/17 1908 05/25/17 0538 05/26/17 1041  NA 134* 133*  --   K 3.8 3.8  --   CL 94* 97*  --   CO2 27 25  --   GLUCOSE 74 90  --   BUN 19 27*  --   CREATININE 2.44* 3.16*  --   CALCIUM 8.9 8.4*  --   PHOS  --   --  4.7*   Liver Function Tests: Recent Labs  Lab 05/24/17 1908  AST 39  ALT 20  ALKPHOS 132*  BILITOT 1.7*  PROT 7.3  ALBUMIN 3.5   CBC: Recent Labs  Lab 05/24/17 1908 05/25/17 0538  WBC 6.7 6.2  NEUTROABS 5.0  --   HGB 13.0 11.5*  HCT 39.1 34.3*  MCV 90.1 90.3  PLT 203 196   Cardiac Enzymes: Recent Labs  Lab 05/24/17 1908  TROPONINI 0.12*     Recent Results (from  the past 240 hour(s))  Blood culture (routine x 2)     Status: None (Preliminary result)   Collection Time: 05/24/17  7:08 PM  Result Value Ref Range Status   Specimen Description BLOOD L FA  Final   Special Requests   Final    BOTTLES DRAWN AEROBIC AND ANAEROBIC Blood Culture adequate volume   Culture   Final    NO GROWTH 4 DAYS Performed at [facility], [address] [place], [place] [address]    Report Status PENDING  Incomplete  Blood culture (routine x 2)     Status: None (Preliminary result)   Collection Time: 05/24/17  7:11 PM  Result Value Ref Range Status   Specimen Description BLOOD BLOOD LEFT WRIST  Final   Special Requests   Final    BOTTLES DRAWN AEROBIC AND ANAEROBIC Blood Culture adequate volume   Culture   Final    NO GROWTH 4 DAYS Performed at [facility],  [address], [place], [place] [address]    Report Status PENDING  Incomplete  MRSA PCR Screening     Status: Abnormal   Collection Time: 05/25/17  5:02 AM  Result Value Ref Range Status   MRSA by PCR POSITIVE (A) NEGATIVE Final    Comment:        The GeneXpert MRSA Assay (FDA approved for NASAL specimens only), is one component of a comprehensive MRSA colonization surveillance program. It is not intended to diagnose MRSA infection nor to guide or monitor treatment for MRSA infections. RESULT CALLED TO, READ BACK BY AND VERIFIED WITH: MARCELLA TURNER AT [place] ON 05/25/2017 JJB Performed at [facility], [address] [place], [place] [address]   Body fluid culture     Status: None (Preliminary result)   Collection Time: 05/25/17  3:15 PM  Result Value Ref Range Status   Specimen Description   Final    PLEURAL Performed at [facility], [address] [place], [place] [address]    Special Requests   Final    NONE Performed at [facility], [place], [place] [address]    Gram Stain NO WBC SEEN NO ORGANISMS SEEN   Final   Culture   Final    NO GROWTH 3 DAYS Performed at [facility], [place] [address] [place], [place] [address]    Report Status PENDING  Incomplete  Gastrointestinal Panel by PCR , Stool     Status: None   Collection Time: 05/26/17  5:05 PM  Result Value Ref Range Status   Campylobacter species NOT DETECTED NOT DETECTED Final   Plesimonas shigelloides NOT DETECTED NOT DETECTED Final   Salmonella species NOT DETECTED NOT DETECTED Final   Yersinia enterocolitica NOT DETECTED NOT DETECTED Final   Vibrio species NOT DETECTED NOT DETECTED Final   Vibrio cholerae NOT DETECTED NOT DETECTED Final   Enteroaggregative E coli (EAEC) NOT DETECTED NOT DETECTED Final   Enteropathogenic E coli (EPEC) NOT DETECTED NOT DETECTED Final   Enterotoxigenic E coli (ETEC) NOT DETECTED NOT DETECTED Final   Shiga like toxin  producing E coli (STEC) NOT DETECTED NOT DETECTED Final   Shigella/Enteroinvasive E coli (EIEC) NOT DETECTED NOT DETECTED Final   Cryptosporidium NOT DETECTED NOT DETECTED Final   Cyclospora cayetanensis NOT DETECTED NOT DETECTED Final   Entamoeba histolytica NOT DETECTED NOT DETECTED Final   Giardia lamblia NOT DETECTED NOT DETECTED Final   Adenovirus F40/41 NOT DETECTED NOT DETECTED Final  Astrovirus NOT DETECTED NOT DETECTED Final   Norovirus GI/GII NOT DETECTED NOT DETECTED Final   Rotavirus A NOT DETECTED NOT DETECTED Final   Sapovirus (I, II, IV, and V) NOT DETECTED NOT DETECTED Final    Comment: Performed at [facility], [address] [place], [place] [address]  C difficile quick scan w PCR reflex     Status: Abnormal   Collection Time: 05/26/17  5:05 PM  Result Value Ref Range Status   C Diff antigen POSITIVE (A) NEGATIVE Final   C Diff toxin NEGATIVE NEGATIVE Final   C Diff interpretation Results are indeterminate. See PCR results.  Final    Comment: Performed at [facility], [address] [place], [place] [address]  C. Diff by PCR, Reflexed     Status: Abnormal   Collection Time: 05/26/17  5:05 PM  Result Value Ref Range Status   Toxigenic C. Difficile by PCR POSITIVE (A) NEGATIVE Final    Comment: Positive for toxigenic C. difficile with little to no toxin production. Only treat if clinical presentation suggests symptomatic illness. Performed at [facility], [address] [place], [place] [address]       Scheduled Meds: . amoxicillin-clavulanate  500 mg Oral QHS  . B-complex with vitamin C  1 tablet Oral Daily  . Chlorhexidine Gluconate Cloth  6 each Topical Q0600  . doxycycline  100 mg Oral Q12H  . feeding supplement  1 Container Oral TID BM  . feeding supplement (PRO-STAT SUGAR FREE 64)  30 mL Oral BID  . heparin  5,000 Units Subcutaneous Q8H  . levothyroxine  25 mcg Oral QAC breakfast  . metoprolol succinate  25 mg Oral Daily   . multivitamin  1 tablet Oral Daily  . multivitamin-lutein  1 capsule Oral Daily  . mupirocin ointment  1 application Nasal BID  . ramipril  2.5 mg Oral QHS  . sertraline  150 mg Oral QHS  . vancomycin  125 mg Oral Q6H   Continuous Infusions: . ceFEPime (MAXIPIME) IV Stopped (05/27/17 2307)    Assessment/Plan:  1. Healthcare associated pneumonia started on vancomycin and cefepime.   patient switched over to Augmentin and doxycycline.  Short course of antibiotic total of 7 days only.  Blood cultures negative x2.  Thoracentesis culture negative. 2. C. difficile colitis.  Oral vancomycin.  Would like to see diarrhea settled down prior to discharge back to her facility.  3. Large right pleural effusion.  Status post thoracentesis removing 1.4 L.   So far cultures are negative. 4. Acute encephalopathy seems to have improved.  Looking over her med rec from facility tramadol and Reglan can cause altered mental status.  Would hold off on these medications. 5. End-stage renal disease on dialysis as per nephrology.  Dialysis today on her usual Monday Wednesday Friday schedule. 6. Acute on chronic systolic congestive heart failure.  Limited with medication secondary to hypotension.  Patient on low-dose metoprolol and ramipril. 7. Hypothyroidism unspecified on levothyroxine 8. Malnutrition on Ensure 9. Depression on Zoloft  Code Status:     Code Status Orders  (From admission, onward)        Start     Ordered   05/24/17 2259  Do not attempt resuscitation (DNR)  Continuous    Question Answer Comment  In the event of cardiac or respiratory ARREST Do not call a "code blue"   In the event of cardiac or respiratory ARREST Do not perform Intubation, CPR, defibrillation or ACLS  In the event of cardiac or respiratory ARREST Use medication by any route, position, wound care, and other measures to relive pain and suffering. May use oxygen, suction and manual treatment of airway obstruction as  needed for comfort.      05/24/17 2258    Code Status History    Date Active Date Inactive Code Status Order ID Comments User Context   02/16/2017 1928 02/17/2017 1949 Full Code [id number]  Corky Mull, MD Inpatient   02/03/2017 1230 02/04/2017 1907 DNR [id number]  Nicholes Mango, MD ED   01/01/2017 2027 01/08/2017 1858 Full Code [id number]  [name], MD ED   05/12/2016 0809 05/14/2016 1439 DNR [id number]  Max Sane, MD Inpatient   05/10/2016 0506 05/12/2016 0809 Full Code [id number]  Saundra Shelling, MD Inpatient   12/15/2015 1515 12/19/2015 0159 Full Code [id number]  Oletta Cohn, DO Inpatient   12/14/2015 1613 12/15/2015 1515 Full Code [id number]  Bettey Costa, MD Inpatient    Advance Directive Documentation     Most Recent Value  Type of Advance Directive  Healthcare Power of Attorney  Pre-existing out of facility DNR order (yellow form or pink MOST form)  -  "MOST" Form in Place?  -     Disposition Plan:  diarrhea will need to settle down prior to disposition  Consultants:  Nephrology  Cardiothoracic surgery  Palliative care  Procedures:  Right-sided thoracentesis  Antibiotics:  doxycycline  Augmentin  Oral vancomycin  Time spent: 28 minutes.  Shermika Balthaser [employer]

## 2017-05-28 NOTE — Progress Notes (Signed)
Physical Therapy Treatment Patient Details [name] MRN: [id number] DOB: 02/27/1926 Today's Date: 05/28/2017    History of Present Illness Pt is an 84 y.o. female presenting to hospital 05/24/17 with AMS from dialysis (increased weakness, lethargy, and cough).  Pt admitted with AMS, HCAP, moderate to large R pleural effusion (s/p thoracentesis 05/25/17), severe chronic systolic CHF, and found to be (+) c-diff.  PMH includes s/p ORIF R clavicle fx 02/16/17, CHF, ESRD on HD, IBS, Meniere's disease, MI, L TKR, pacemaker.    PT Comments    Pt presents with deficits in strength, transfers, mobility, gait, balance, and activity tolerance.  Pt required increased time and effort during sit to/from stand transfer training but no physical assist.  Pt given verbal cues for proper sequencing during transfer training with fair carryover.  Pt ambulated x 20' with a RW and CGA with flexed trunk posture and decreased LLE stance time but was steady without LOB.  Pt will benefit from PT services in her SNF setting upon discharge to safely address above deficits for decreased caregiver assistance and eventual return to PLOF.     Follow Up Recommendations  SNF     Equipment Recommendations       Recommendations for Other Services       Precautions / Restrictions Precautions Precautions: Fall Precaution Comments: R forearm AV fistula; L IJ HD catheter; pacemaker Restrictions Weight Bearing Restrictions: No    Mobility  Bed Mobility               General bed mobility comments: NT this session, pt in recliner  Transfers Overall transfer level: Needs assistance Equipment used: Rolling walker (2 wheeled) Transfers: Sit to/from Stand Sit to Stand: Min guard         General transfer comment: Slow effortful stand from sitting position with min verbal cues for hand placement; fair eccentric control during stand to sit  Ambulation/Gait Ambulation/Gait assistance: Min  guard Ambulation Distance (Feet): 20 Feet Assistive device: Rolling walker (2 wheeled) Gait Pattern/deviations: Step-through pattern;Decreased step length - right;Trunk flexed;Decreased stance time - left Gait velocity: decreased   General Gait Details: Decreased stance time on previously fractured LLE with flexed trunk and short B step length with verbal cues for upright posture and amb closer to [employer]    Modified Rankin (Stroke Patients Only)       Balance Overall balance assessment: Needs assistance Sitting-balance support: No upper extremity supported;Feet supported Sitting balance-Leahy Scale: Good     Standing balance support: Bilateral upper extremity supported Standing balance-Leahy Scale: Fair                              Cognition Arousal/Alertness: Awake/alert Behavior During Therapy: WFL for tasks assessed/performed Overall Cognitive Status: Within Functional Limits for tasks assessed                                        Exercises Total Joint Exercises Ankle Circles/Pumps: AROM;Both;10 reps;15 reps Long Arc Quad: AROM;Both;10 reps;15 reps Knee Flexion: AROM;Both;10 reps;15 reps Marching in Standing: AROM;Both;10 reps;15 reps;Seated Other Exercises Other Exercises: Sit to/from stand transfer training from various height surfaces    General Comments        Pertinent Vitals/Pain Pain Assessment: No/denies pain  Home Living                      Prior Function            PT Goals (current goals can now be found in the care plan section)      Frequency    Min 2X/week      PT Plan Current plan remains appropriate    Co-evaluation              AM-PAC PT "6 Clicks" Daily Activity  Outcome Measure                   End of Session Equipment Utilized During Treatment: Gait belt Activity Tolerance: Patient tolerated treatment well Patient  left: Other (comment)(Pt left on toilet having a BM with nursing notified) Nurse Communication: Mobility status PT Visit Diagnosis: Other abnormalities of gait and mobility (R26.89);Muscle weakness (generalized) (M62.81);History of falling (Z91.81);Difficulty in walking, not elsewhere classified (R26.2)     Time: 0950(and 9:27 to 9:31)-1003 PT Time Calculation (min) (ACUTE ONLY): 13 min  Charges:  $Therapeutic Exercise: 8-22 mins                    G Codes:       D. Royetta Asal PT, DPT 05/28/17, 10:14 AM

## 2017-05-28 NOTE — Progress Notes (Signed)
HD tx start    05/28/17 2025  Vital Signs  Pulse Rate 60  Pulse Rate Source Monitor  Resp 14  BP (!) 149/59  BP Location Left Arm  BP Method Automatic  Patient Position (if appropriate) Lying  Oxygen Therapy  SpO2 99 %  O2 Device Room Air  During Hemodialysis Assessment  Blood Flow Rate (mL/min) 400 mL/min  Arterial Pressure (mmHg) -160 mmHg  Venous Pressure (mmHg) 140 mmHg  Transmembrane Pressure (mmHg) 60 mmHg  Ultrafiltration Rate (mL/min) 170 mL/min  Dialysate Flow Rate (mL/min) 800 ml/min  Conductivity: Machine  13.8  HD Safety Checks Performed Yes  Dialysis Fluid Bolus Normal Saline  Bolus Amount (mL) 250 mL  Intra-Hemodialysis Comments Tx initiated

## 2017-05-28 NOTE — Progress Notes (Signed)
HD tx end   05/28/17 2330  Vital Signs  Pulse Rate 69  Pulse Rate Source Monitor  Resp 17  BP (!) 159/65  BP Location Left Arm  BP Method Automatic  Patient Position (if appropriate) Lying  Oxygen Therapy  SpO2 98 %  O2 Device Room Air  During Hemodialysis Assessment  Dialysis Fluid Bolus Normal Saline  Bolus Amount (mL) 250 mL  Intra-Hemodialysis Comments Tx completed

## 2017-05-29 LAB — BODY FLUID CULTURE
Culture: NO GROWTH
Gram Stain: NONE SEEN

## 2017-05-29 LAB — CULTURE, BLOOD (ROUTINE X 2)
CULTURE: NO GROWTH
Culture: NO GROWTH
Special Requests: ADEQUATE
Special Requests: ADEQUATE

## 2017-05-29 MED ORDER — GUAIFENESIN ER 600 MG PO TB12
600.0000 mg | ORAL_TABLET | Freq: Two times a day (BID) | ORAL | Status: DC
  Administered 2017-05-29 – 2017-06-01 (×6): 600 mg via ORAL
  Filled 2017-05-29 (×7): qty 1

## 2017-05-29 MED ORDER — BENZONATATE 100 MG PO CAPS
200.0000 mg | ORAL_CAPSULE | Freq: Three times a day (TID) | ORAL | Status: DC
  Administered 2017-05-29 – 2017-06-01 (×9): 200 mg via ORAL
  Filled 2017-05-29 (×10): qty 2

## 2017-05-29 MED ORDER — ONDANSETRON HCL 4 MG/2ML IJ SOLN
INTRAMUSCULAR | Status: AC
  Administered 2017-05-29: 4 mg
  Filled 2017-05-29: qty 2

## 2017-05-29 MED ORDER — ONDANSETRON HCL 4 MG/2ML IJ SOLN
4.0000 mg | Freq: Four times a day (QID) | INTRAMUSCULAR | Status: DC | PRN
  Administered 2017-05-31: 4 mg via INTRAVENOUS
  Filled 2017-05-29: qty 2

## 2017-05-29 NOTE — Progress Notes (Signed)
Patient ID: Sharon [name], female   DOB: 1926/04/01, 84 y.o.   MRN: [id number]   [employer] PROGRESS NOTE  Sharon [name] TGG:[id number] DOB: 1926-04-26 DOA: 05/24/2017 PCP: Kirk Ruths, MD  HPI/Subjective: Patient complains of cough and shortness of breath   Objective: Vitals:   05/29/17 0525 05/29/17 1230  BP: 135/77 (!) 125/56  Pulse: 65 61  Resp: 18 18  Temp: 97.6 F (36.4 C) 97.6 F (36.4 C)  SpO2: 95% 97%    Filed Weights   05/26/17 1346 05/28/17 2015 05/28/17 2339  Weight: 42.7 kg (94 lb 2.2 oz) 47.3 kg (104 lb 4.4 oz) 47.2 kg (104 lb 0.9 oz)    ROS: Review of Systems  Constitutional: Negative for chills and fever.  Eyes: Negative for blurred vision.  Respiratory: Positive for cough and shortness of breath.   Cardiovascular: Negative for chest pain.  Gastrointestinal: Positive for diarrhea. Negative for abdominal pain, constipation, nausea and vomiting.  Genitourinary: Negative for dysuria.  Musculoskeletal: Negative for joint pain.  Neurological: Negative for dizziness and headaches.   Exam: Physical Exam  Constitutional: She is oriented to person, place, and time.  HENT:  Nose: No mucosal edema.  Mouth/Throat: No oropharyngeal exudate or posterior oropharyngeal edema.  Eyes: Pupils are equal, round, and reactive to light. Conjunctivae, EOM and lids are normal.  Neck: No JVD present. Carotid bruit is not present. No edema present. No thyroid mass and no thyromegaly present.  Cardiovascular: S1 normal and S2 normal. Exam reveals no gallop.  Murmur heard.  Systolic murmur is present with a grade of 2/6. Pulses:      Dorsalis pedis pulses are 2+ on the right side, and 2+ on the left side.  Respiratory: No respiratory distress. She has decreased breath sounds in the right lower field. She has no wheezes. She has rhonchi in the right lower field and the left lower field. She has no rales.  GI: Soft. Bowel sounds are normal. There is no  tenderness.  Musculoskeletal:       Right ankle: She exhibits no swelling.       Left ankle: She exhibits no swelling.  Lymphadenopathy:    She has no cervical adenopathy.  Neurological: She is alert and oriented to person, place, and time. No cranial nerve deficit.  Skin: Skin is warm. Nails show no clubbing.  Psychiatric: She has a normal mood and affect.      Data Reviewed: Basic Metabolic Panel: Recent Labs  Lab 05/24/17 1908 05/25/17 0538 05/26/17 1041  NA 134* 133*  --   K 3.8 3.8  --   CL 94* 97*  --   CO2 27 25  --   GLUCOSE 74 90  --   BUN 19 27*  --   CREATININE 2.44* 3.16*  --   CALCIUM 8.9 8.4*  --   PHOS  --   --  4.7*   Liver Function Tests: Recent Labs  Lab 05/24/17 1908  AST 39  ALT 20  ALKPHOS 132*  BILITOT 1.7*  PROT 7.3  ALBUMIN 3.5   CBC: Recent Labs  Lab 05/24/17 1908 05/25/17 0538  WBC 6.7 6.2  NEUTROABS 5.0  --   HGB 13.0 11.5*  HCT 39.1 34.3*  MCV 90.1 90.3  PLT 203 196   Cardiac Enzymes: Recent Labs  Lab 05/24/17 1908  TROPONINI 0.12*     Recent Results (from the past 240 hour(s))  Blood culture (routine x 2)     Status: None  Collection Time: 05/24/17  7:08 PM  Result Value Ref Range Status   Specimen Description BLOOD L FA  Final   Special Requests   Final    BOTTLES DRAWN AEROBIC AND ANAEROBIC Blood Culture adequate volume   Culture   Final    NO GROWTH 5 DAYS Performed at [facility], [address] [place], [place] [address]    Report Status 05/29/2017 FINAL  Final  Blood culture (routine x 2)     Status: None   Collection Time: 05/24/17  7:11 PM  Result Value Ref Range Status   Specimen Description BLOOD BLOOD LEFT WRIST  Final   Special Requests   Final    BOTTLES DRAWN AEROBIC AND ANAEROBIC Blood Culture adequate volume   Culture   Final    NO GROWTH 5 DAYS Performed at [facility], [address] [place], [place] [address]    Report Status 05/29/2017 FINAL  Final  MRSA PCR  Screening     Status: Abnormal   Collection Time: 05/25/17  5:02 AM  Result Value Ref Range Status   MRSA by PCR POSITIVE (A) NEGATIVE Final    Comment:        The GeneXpert MRSA Assay (FDA approved for NASAL specimens only), is one component of a comprehensive MRSA colonization surveillance program. It is not intended to diagnose MRSA infection nor to guide or monitor treatment for MRSA infections. RESULT CALLED TO, READ BACK BY AND VERIFIED WITH: MARCELLA TURNER AT [place] ON 05/25/2017 JJB Performed at [facility], [address], [place], [place] [address]   Body fluid culture     Status: None   Collection Time: 05/25/17  3:15 PM  Result Value Ref Range Status   Specimen Description   Final    PLEURAL Performed at [facility], [address] [place], [place] [address]    Special Requests   Final    NONE Performed at [facility], [place],  [address]    Gram Stain NO WBC SEEN NO ORGANISMS SEEN   Final   Culture   Final    NO GROWTH 3 DAYS Performed at [facility], [place] [address] [place],  [address]    Report Status 05/29/2017 FINAL  Final  Gastrointestinal Panel by PCR , Stool     Status: None   Collection Time: 05/26/17  5:05 PM  Result Value Ref Range Status   Campylobacter species NOT DETECTED NOT DETECTED Final   Plesimonas shigelloides NOT DETECTED NOT DETECTED Final   Salmonella species NOT DETECTED NOT DETECTED Final   Yersinia enterocolitica NOT DETECTED NOT DETECTED Final   Vibrio species NOT DETECTED NOT DETECTED Final   Vibrio cholerae NOT DETECTED NOT DETECTED Final   Enteroaggregative E coli (EAEC) NOT DETECTED NOT DETECTED Final   Enteropathogenic E coli (EPEC) NOT DETECTED NOT DETECTED Final   Enterotoxigenic E coli (ETEC) NOT DETECTED NOT DETECTED Final   Shiga like toxin producing E coli (STEC) NOT DETECTED NOT DETECTED Final   Shigella/Enteroinvasive E coli (EIEC) NOT DETECTED NOT  DETECTED Final   Cryptosporidium NOT DETECTED NOT DETECTED Final   Cyclospora cayetanensis NOT DETECTED NOT DETECTED Final   Entamoeba histolytica NOT DETECTED NOT DETECTED Final   Giardia lamblia NOT DETECTED NOT DETECTED Final   Adenovirus F40/41 NOT DETECTED NOT DETECTED Final   Astrovirus NOT DETECTED NOT DETECTED Final   Norovirus GI/GII NOT DETECTED NOT DETECTED Final   Rotavirus A NOT  DETECTED NOT DETECTED Final   Sapovirus (I, II, IV, and V) NOT DETECTED NOT DETECTED Final    Comment: Performed at [facility], [address] [place], [place] [address]  C difficile quick scan w PCR reflex     Status: Abnormal   Collection Time: 05/26/17  5:05 PM  Result Value Ref Range Status   C Diff antigen POSITIVE (A) NEGATIVE Final   C Diff toxin NEGATIVE NEGATIVE Final   C Diff interpretation Results are indeterminate. See PCR results.  Final    Comment: Performed at [facility], [address] [place], [place] [address]  C. Diff by PCR, Reflexed     Status: Abnormal   Collection Time: 05/26/17  5:05 PM  Result Value Ref Range Status   Toxigenic C. Difficile by PCR POSITIVE (A) NEGATIVE Final    Comment: Positive for toxigenic C. difficile with little to no toxin production. Only treat if clinical presentation suggests symptomatic illness. Performed at [facility], [address] [place],  [address]       Scheduled Meds: . amoxicillin-clavulanate  500 mg Oral QHS  . B-complex with vitamin C  1 tablet Oral Daily  . benzonatate  200 mg Oral TID  . Chlorhexidine Gluconate Cloth  6 each Topical Q0600  . doxycycline  100 mg Oral Q12H  . feeding supplement  1 Container Oral TID BM  . feeding supplement (PRO-STAT SUGAR FREE 64)  30 mL Oral BID  . guaiFENesin  600 mg Oral BID  . heparin  5,000 Units Subcutaneous Q8H  . levothyroxine  25 mcg Oral QAC breakfast  . metoprolol succinate  25 mg Oral Daily  . multivitamin  1 tablet Oral Daily  .  multivitamin-lutein  1 capsule Oral Daily  . mupirocin ointment  1 application Nasal BID  . ramipril  2.5 mg Oral QHS  . sertraline  150 mg Oral QHS  . vancomycin  125 mg Oral Q6H   Continuous Infusions:   Assessment/Plan:  1. Healthcare associated pneumonia started on vancomycin and cefepime.   Continue vancomycin,  thoracentesis culture negative. 2. C. difficile colitis.  Continue oral vancomycin.   3. Large right pleural effusion.  Status post thoracentesis removing 1.4 L.   So far cultures are negative. 4. Acute encephalopathy seems to have improved.  Could be medication and infection related waxing and waning 5. End-stage renal disease on dialysis as per nephrology.  Dialysis today on her usual Monday Wednesday Friday schedule. 6. Acute on chronic systolic congestive heart failure.  Limited with medication secondary to hypotension.  Patient on low-dose metoprolol and ramipril. 7. Hypothyroidism unspecified on levothyroxine 8. Malnutrition on Ensure 9. Depression on Zoloft  Code Status:     Code Status Orders  (From admission, onward)        Start     Ordered   05/24/17 2259  Do not attempt resuscitation (DNR)  Continuous    Question Answer Comment  In the event of cardiac or respiratory ARREST Do not call a "code blue"   In the event of cardiac or respiratory ARREST Do not perform Intubation, CPR, defibrillation or ACLS   In the event of cardiac or respiratory ARREST Use medication by any route, position, wound care, and other measures to relive pain and suffering. May use oxygen, suction and manual treatment of airway obstruction as needed for comfort.      05/24/17 2258    Code Status History    Date Active Date  Inactive Code Status Order ID Comments User Context   02/16/2017 1928 02/17/2017 1949 Full Code [id number]  Corky Mull, MD Inpatient   02/03/2017 1230 02/04/2017 1907 DNR [id number]  Nicholes Mango, MD ED   01/01/2017 2027 01/08/2017 1858 Full Code [id number]  [name], MD ED   05/12/2016 0809 05/14/2016 1439 DNR [id number]  Max Sane, MD Inpatient   05/10/2016 0506 05/12/2016 0809 Full Code [id number]  Saundra Shelling, MD Inpatient   12/15/2015 1515 12/19/2015 0159 Full Code [id number]  Oletta Cohn, DO Inpatient   12/14/2015 1613 12/15/2015 1515 Full Code [id number]  Bettey Costa, MD Inpatient    Advance Directive Documentation     Most Recent Value  Type of Advance Directive  Healthcare Power of Attorney  Pre-existing out of facility DNR order (yellow form or pink MOST form)  -  "MOST" Form in Place?  -     Disposition Plan:  diarrhea will need to settle down prior to disposition  Consultants:  Nephrology  Cardiothoracic surgery  Palliative care  Procedures:  Right-sided thoracentesis  Antibiotics:  doxycycline  Augmentin  Oral vancomycin  Time spent: 28 minutes.  Jennfier Abdulla [employer] [employer]

## 2017-05-29 NOTE — Progress Notes (Signed)
[facility], [place] 05/29/17  Subjective:   Patient admitted from the dialysis unit for decreased responsiveness; Evaluation in the emergency room shows large right pleural effusion and possible pneumonia.   Patient had thoracentesis this admission.  1400 cc of fluid was removed  This morning, patient is very weak and states that she does not feel good at all.  Her appetite is not good today.  She states she had maybe 2 loose bowel movements last evening.  She has cough with yellowish sputum production   Objective:  Vital signs in last 24 hours:  Temp:  [97.6 F (36.4 C)-98.3 F (36.8 C)] 97.6 F (36.4 C) (05/04 0525) Pulse Rate:  [59-71] 65 (05/04 0525) Resp:  [11-20] 18 (05/04 0525) BP: (131-159)/(46-77) 135/77 (05/04 0525) SpO2:  [95 %-100 %] 95 % (05/04 0525) Weight:  [104 lb 0.9 oz (47.2 kg)-104 lb 4.4 oz (47.3 kg)] 104 lb 0.9 oz (47.2 kg) (05/03 2339)  Weight change:  Filed Weights   05/26/17 1346 05/28/17 2015 05/28/17 2339  Weight: 94 lb 2.2 oz (42.7 kg) 104 lb 4.4 oz (47.3 kg) 104 lb 0.9 oz (47.2 kg)    Intake/Output:    Intake/Output Summary (Last 24 hours) at 05/29/2017 0946 Last data filed at 05/28/2017 2339 Gross per 24 hour  Intake -  Output 10 ml  Net -10 ml     Physical Exam: General:  Frail, elderly, laying in the bed  HEENT  anicteric, moist mucous membranes  Neck  supple  Pulm/lungs  normal breathing effort, decreased breath sounds at right bases  CVS/Heart  regular, no rub  Abdomen:   Soft  Extremities:  Trace edema  Neurologic:  Alert, able to follow commands  Skin:  Multiple ecchymosis  Access:  Right forearm AV fistula, left IJ PermCath       Basic Metabolic Panel:  Recent Labs  Lab 05/24/17 1908 05/25/17 0538 05/26/17 1041  NA 134* 133*  --   K 3.8 3.8  --   CL 94* 97*  --   CO2 27 25  --   GLUCOSE 74 90  --   BUN 19 27*  --   CREATININE 2.44* 3.16*  --   CALCIUM 8.9 8.4*  --   PHOS  --   --  4.7*      CBC: Recent Labs  Lab 05/24/17 1908 05/25/17 0538  WBC 6.7 6.2  NEUTROABS 5.0  --   HGB 13.0 11.5*  HCT 39.1 34.3*  MCV 90.1 90.3  PLT 203 196      Lab Results  Component Value Date   HEPBSAG Negative 05/26/2017      Microbiology:  Recent Results (from the past 240 hour(s))  Blood culture (routine x 2)     Status: None   Collection Time: 05/24/17  7:08 PM  Result Value Ref Range Status   Specimen Description BLOOD L FA  Final   Special Requests   Final    BOTTLES DRAWN AEROBIC AND ANAEROBIC Blood Culture adequate volume   Culture   Final    NO GROWTH 5 DAYS Performed at [facility], [address], [place], [place] [address]    Report Status 05/29/2017 FINAL  Final  Blood culture (routine x 2)     Status: None   Collection Time: 05/24/17  7:11 PM  Result Value Ref Range Status   Specimen Description BLOOD BLOOD LEFT WRIST  Final   Special Requests   Final  BOTTLES DRAWN AEROBIC AND ANAEROBIC Blood Culture adequate volume   Culture   Final    NO GROWTH 5 DAYS Performed at [facility], [address] [place], [place] [address]    Report Status 05/29/2017 FINAL  Final  MRSA PCR Screening     Status: Abnormal   Collection Time: 05/25/17  5:02 AM  Result Value Ref Range Status   MRSA by PCR POSITIVE (A) NEGATIVE Final    Comment:        The GeneXpert MRSA Assay (FDA approved for NASAL specimens only), is one component of a comprehensive MRSA colonization surveillance program. It is not intended to diagnose MRSA infection nor to guide or monitor treatment for MRSA infections. RESULT CALLED TO, READ BACK BY AND VERIFIED WITH: MARCELLA TURNER AT [place] ON 05/25/2017 JJB Performed at [facility], [address] [place], [place] [address]   Body fluid culture     Status: None (Preliminary result)   Collection Time: 05/25/17  3:15 PM  Result Value Ref Range Status   Specimen Description   Final    PLEURAL Performed at  [facility], [address] [place], [place] [address]    Special Requests   Final    NONE Performed at [facility], [place], [place] [address]    Gram Stain NO WBC SEEN NO ORGANISMS SEEN   Final   Culture   Final    NO GROWTH 3 DAYS Performed at [facility], [address], [place], [place] [address]    Report Status PENDING  Incomplete  Gastrointestinal Panel by PCR , Stool     Status: None   Collection Time: 05/26/17  5:05 PM  Result Value Ref Range Status   Campylobacter species NOT DETECTED NOT DETECTED Final   Plesimonas shigelloides NOT DETECTED NOT DETECTED Final   Salmonella species NOT DETECTED NOT DETECTED Final   Yersinia enterocolitica NOT DETECTED NOT DETECTED Final   Vibrio species NOT DETECTED NOT DETECTED Final   Vibrio cholerae NOT DETECTED NOT DETECTED Final   Enteroaggregative E coli (EAEC) NOT DETECTED NOT DETECTED Final   Enteropathogenic E coli (EPEC) NOT DETECTED NOT DETECTED Final   Enterotoxigenic E coli (ETEC) NOT DETECTED NOT DETECTED Final   Shiga like toxin producing E coli (STEC) NOT DETECTED NOT DETECTED Final   Shigella/Enteroinvasive E coli (EIEC) NOT DETECTED NOT DETECTED Final   Cryptosporidium NOT DETECTED NOT DETECTED Final   Cyclospora cayetanensis NOT DETECTED NOT DETECTED Final   Entamoeba histolytica NOT DETECTED NOT DETECTED Final   Giardia lamblia NOT DETECTED NOT DETECTED Final   Adenovirus F40/41 NOT DETECTED NOT DETECTED Final   Astrovirus NOT DETECTED NOT DETECTED Final   Norovirus GI/GII NOT DETECTED NOT DETECTED Final   Rotavirus A NOT DETECTED NOT DETECTED Final   Sapovirus (I, II, IV, and V) NOT DETECTED NOT DETECTED Final    Comment: Performed at [facility], [address] [place], [place] [address]  C difficile quick scan w PCR reflex     Status: Abnormal   Collection Time: 05/26/17  5:05 PM  Result Value Ref Range Status   C Diff antigen POSITIVE (A) NEGATIVE  Final   C Diff toxin NEGATIVE NEGATIVE Final   C Diff interpretation Results are indeterminate. See PCR results.  Final    Comment: Performed at [facility], [address] [place], [place] [address]  C. Diff by PCR, Reflexed     Status: Abnormal  Collection Time: 05/26/17  5:05 PM  Result Value Ref Range Status   Toxigenic C. Difficile by PCR POSITIVE (A) NEGATIVE Final    Comment: Positive for toxigenic C. difficile with little to no toxin production. Only treat if clinical presentation suggests symptomatic illness. Performed at [facility], [address] [place], [place] [address]     Coagulation Studies: No results for input(s): LABPROT, INR in the last 72 hours.  Urinalysis: No results for input(s): COLORURINE, LABSPEC, PHURINE, GLUCOSEU, HGBUR, BILIRUBINUR, KETONESUR, PROTEINUR, UROBILINOGEN, NITRITE, LEUKOCYTESUR in the last 72 hours.  Invalid input(s): APPERANCEUR    Imaging: No results found.   Medications:    . amoxicillin-clavulanate  500 mg Oral QHS  . B-complex with vitamin C  1 tablet Oral Daily  . Chlorhexidine Gluconate Cloth  6 each Topical Q0600  . doxycycline  100 mg Oral Q12H  . feeding supplement  1 Container Oral TID BM  . feeding supplement (PRO-STAT SUGAR FREE 64)  30 mL Oral BID  . heparin  5,000 Units Subcutaneous Q8H  . levothyroxine  25 mcg Oral QAC breakfast  . metoprolol succinate  25 mg Oral Daily  . multivitamin  1 tablet Oral Daily  . multivitamin-lutein  1 capsule Oral Daily  . mupirocin ointment  1 application Nasal BID  . ramipril  2.5 mg Oral QHS  . sertraline  150 mg Oral QHS  . vancomycin  125 mg Oral Q6H   acetaminophen, docusate sodium, guaiFENesin-dextromethorphan, metoCLOPramide, polyethylene glycol, witch hazel-glycerin  Assessment/ Plan:  84 y.o. female  with end stage renal disease on hemodialysis, coronary artery disease, pacemaker placement, meniere's disease, congestive heart failure, peripheral  vascular disease, multiple falls and fractures.   MWF [facility]   1.  End-stage renal disease 2.  Anemia of chronic kidney disease 3.  Secondary hyperparathyroidism 4.  Right pleural effusion, possible pneumonia 5.  C. difficile colitis  Plan: Hemodialysis  MWF schedule PermCath removal as outpatient, as AV cannulation was not successful inpatient Right chest thoracentesis - 1400 cc removed. Able to breath better.  Patient declined Pleurx catheter Palliative team will be following  Monitor Hgb and Phos Next hemodialysis on Monday Treatment for C. difficile infection as per primary team    LOS: 5 Isadore Palecek 5/4/20199:46 AM  [facility], [place]  Note: This note was prepared with Dragon dictation. Any transcription errors are unintentional

## 2017-05-30 LAB — BASIC METABOLIC PANEL
Anion gap: 12 (ref 5–15)
BUN: 54 mg/dL — AB (ref 6–20)
CALCIUM: 8.5 mg/dL — AB (ref 8.9–10.3)
CO2: 25 mmol/L (ref 22–32)
CREATININE: 3.25 mg/dL — AB (ref 0.44–1.00)
Chloride: 96 mmol/L — ABNORMAL LOW (ref 101–111)
GFR calc Af Amer: 14 mL/min — ABNORMAL LOW (ref >60)
GFR, EST NON AFRICAN AMERICAN: 12 mL/min — AB (ref >60)
GLUCOSE: 98 mg/dL (ref 65–99)
Potassium: 4.5 mmol/L (ref 3.5–5.1)
SODIUM: 133 mmol/L — AB (ref 135–145)

## 2017-05-30 LAB — CBC
HEMATOCRIT: 33.9 % — AB (ref 35.0–47.0)
HEMOGLOBIN: 11.3 g/dL — AB (ref 12.0–16.0)
MCH: 30 pg (ref 26.0–34.0)
MCHC: 33.3 g/dL (ref 32.0–36.0)
MCV: 90 fL (ref 80.0–100.0)
PLATELETS: 219 10*3/uL (ref 150–440)
RBC: 3.76 MIL/uL — ABNORMAL LOW (ref 3.80–5.20)
RDW: 16.2 % — AB (ref 11.5–14.5)
WBC: 8 10*3/uL (ref 3.6–11.0)

## 2017-05-30 NOTE — Progress Notes (Signed)
[facility], [place] 05/30/17  Subjective:   Patient admitted from the dialysis unit for decreased responsiveness; Evaluation in the emergency room shows large right pleural effusion and possible pneumonia.   Patient had thoracentesis this admission.  1400 cc of fluid was removed  This morning, patient feels better, sitting up in chair, eating lunch Nurse reports loose stools this morning  Objective:  Vital signs in last 24 hours:  Temp:  [97.9 F (36.6 C)-99.1 F (37.3 C)] 97.9 F (36.6 C) (05/05 1221) Pulse Rate:  [61-66] 66 (05/05 1221) Resp:  [16-19] 19 (05/05 1221) BP: (129-142)/(57-69) 139/69 (05/05 1221) SpO2:  [94 %-100 %] 100 % (05/05 1221)  Weight change:  Filed Weights   05/26/17 1346 05/28/17 2015 05/28/17 2339  Weight: 94 lb 2.2 oz (42.7 kg) 104 lb 4.4 oz (47.3 kg) 104 lb 0.9 oz (47.2 kg)    Intake/Output:   No intake or output data in the 24 hours ending 05/30/17 1512   Physical Exam: General:  Frail, elderly, sitting up in chair  HEENT  anicteric, moist mucous membranes  Neck  supple  Pulm/lungs  normal breathing effort, decreased breath sounds at right base  CVS/Heart  regular, no rub  Abdomen:   Soft  Extremities:  Trace edema  Neurologic:  Alert, able to follow commands  Skin:  Multiple ecchymosis  Access:  Right forearm AV fistula, left IJ PermCath       Basic Metabolic Panel:  Recent Labs  Lab 05/24/17 1908 05/25/17 0538 05/26/17 1041 05/30/17 0555  NA 134* 133*  --  133*  K 3.8 3.8  --  4.5  CL 94* 97*  --  96*  CO2 27 25  --  25  GLUCOSE 74 90  --  98  BUN 19 27*  --  54*  CREATININE 2.44* 3.16*  --  3.25*  CALCIUM 8.9 8.4*  --  8.5*  PHOS  --   --  4.7*  --      CBC: Recent Labs  Lab 05/24/17 1908 05/25/17 0538 05/30/17 0555  WBC 6.7 6.2 8.0  NEUTROABS 5.0  --   --   HGB 13.0 11.5* 11.3*  HCT 39.1 34.3* 33.9*  MCV 90.1 90.3 90.0  PLT 203 196 219      Lab Results  Component Value Date    HEPBSAG Negative 05/26/2017      Microbiology:  Recent Results (from the past 240 hour(s))  Blood culture (routine x 2)     Status: None   Collection Time: 05/24/17  7:08 PM  Result Value Ref Range Status   Specimen Description BLOOD L FA  Final   Special Requests   Final    BOTTLES DRAWN AEROBIC AND ANAEROBIC Blood Culture adequate volume   Culture   Final    NO GROWTH 5 DAYS Performed at [facility], [address] [place], [place] [address]    Report Status 05/29/2017 FINAL  Final  Blood culture (routine x 2)     Status: None   Collection Time: 05/24/17  7:11 PM  Result Value Ref Range Status   Specimen Description BLOOD BLOOD LEFT WRIST  Final   Special Requests   Final    BOTTLES DRAWN AEROBIC AND ANAEROBIC Blood Culture adequate volume   Culture   Final    NO GROWTH 5 DAYS Performed at [facility], [address] [place], [place] [address]    Report Status 05/29/2017 FINAL  Final  MRSA  PCR Screening     Status: Abnormal   Collection Time: 05/25/17  5:02 AM  Result Value Ref Range Status   MRSA by PCR POSITIVE (A) NEGATIVE Final    Comment:        The GeneXpert MRSA Assay (FDA approved for NASAL specimens only), is one component of a comprehensive MRSA colonization surveillance program. It is not intended to diagnose MRSA infection nor to guide or monitor treatment for MRSA infections. RESULT CALLED TO, READ BACK BY AND VERIFIED WITH: MARCELLA TURNER AT [place] ON 05/25/2017 JJB Performed at [facility], [address] [place], [place] [address]   Body fluid culture     Status: None   Collection Time: 05/25/17  3:15 PM  Result Value Ref Range Status   Specimen Description   Final    PLEURAL Performed at [facility], [address] [place], [place] [address]    Special Requests   Final    NONE Performed at [facility], [place], [place] [address]    Gram Stain NO WBC SEEN NO  ORGANISMS SEEN   Final   Culture   Final    NO GROWTH 3 DAYS Performed at [facility], [address] [place], [place] [address]    Report Status 05/29/2017 FINAL  Final  Gastrointestinal Panel by PCR , Stool     Status: None   Collection Time: 05/26/17  5:05 PM  Result Value Ref Range Status   Campylobacter species NOT DETECTED NOT DETECTED Final   Plesimonas shigelloides NOT DETECTED NOT DETECTED Final   Salmonella species NOT DETECTED NOT DETECTED Final   Yersinia enterocolitica NOT DETECTED NOT DETECTED Final   Vibrio species NOT DETECTED NOT DETECTED Final   Vibrio cholerae NOT DETECTED NOT DETECTED Final   Enteroaggregative E coli (EAEC) NOT DETECTED NOT DETECTED Final   Enteropathogenic E coli (EPEC) NOT DETECTED NOT DETECTED Final   Enterotoxigenic E coli (ETEC) NOT DETECTED NOT DETECTED Final   Shiga like toxin producing E coli (STEC) NOT DETECTED NOT DETECTED Final   Shigella/Enteroinvasive E coli (EIEC) NOT DETECTED NOT DETECTED Final   Cryptosporidium NOT DETECTED NOT DETECTED Final   Cyclospora cayetanensis NOT DETECTED NOT DETECTED Final   Entamoeba histolytica NOT DETECTED NOT DETECTED Final   Giardia lamblia NOT DETECTED NOT DETECTED Final   Adenovirus F40/41 NOT DETECTED NOT DETECTED Final   Astrovirus NOT DETECTED NOT DETECTED Final   Norovirus GI/GII NOT DETECTED NOT DETECTED Final   Rotavirus A NOT DETECTED NOT DETECTED Final   Sapovirus (I, II, IV, and V) NOT DETECTED NOT DETECTED Final    Comment: Performed at [facility], [address] [place], [place] [address]  C difficile quick scan w PCR reflex     Status: Abnormal   Collection Time: 05/26/17  5:05 PM  Result Value Ref Range Status   C Diff antigen POSITIVE (A) NEGATIVE Final   C Diff toxin NEGATIVE NEGATIVE Final   C Diff interpretation Results are indeterminate. See PCR results.  Final    Comment: Performed at [facility], [address] [place], [place] [address]  C.  Diff by PCR, Reflexed     Status: Abnormal   Collection Time: 05/26/17  5:05 PM  Result Value Ref Range Status   Toxigenic C. Difficile by PCR POSITIVE (A) NEGATIVE Final    Comment: Positive for toxigenic C. difficile with little to no toxin production. Only treat if clinical presentation suggests  symptomatic illness. Performed at [facility], [address] [place], [place] [address]     Coagulation Studies: No results for input(s): LABPROT, INR in the last 72 hours.  Urinalysis: No results for input(s): COLORURINE, LABSPEC, PHURINE, GLUCOSEU, HGBUR, BILIRUBINUR, KETONESUR, PROTEINUR, UROBILINOGEN, NITRITE, LEUKOCYTESUR in the last 72 hours.  Invalid input(s): APPERANCEUR    Imaging: No results found.   Medications:    . amoxicillin-clavulanate  500 mg Oral QHS  . B-complex with vitamin C  1 tablet Oral Daily  . benzonatate  200 mg Oral TID  . Chlorhexidine Gluconate Cloth  6 each Topical Q0600  . doxycycline  100 mg Oral Q12H  . feeding supplement  1 Container Oral TID BM  . feeding supplement (PRO-STAT SUGAR FREE 64)  30 mL Oral BID  . guaiFENesin  600 mg Oral BID  . heparin  5,000 Units Subcutaneous Q8H  . levothyroxine  25 mcg Oral QAC breakfast  . metoprolol succinate  25 mg Oral Daily  . multivitamin  1 tablet Oral Daily  . multivitamin-lutein  1 capsule Oral Daily  . ramipril  2.5 mg Oral QHS  . sertraline  150 mg Oral QHS  . vancomycin  125 mg Oral Q6H   acetaminophen, docusate sodium, guaiFENesin-dextromethorphan, metoCLOPramide, ondansetron (ZOFRAN) IV, polyethylene glycol, witch hazel-glycerin  Assessment/ Plan:  84 y.o. female  with end stage renal disease on hemodialysis, coronary artery disease, pacemaker placement, meniere's disease, congestive heart failure, peripheral vascular disease, multiple falls and fractures.   MWF [facility]   1.  End-stage renal disease 2.  Anemia of chronic kidney disease 3.  Secondary  hyperparathyroidism 4.  Right pleural effusion, possible pneumonia 5.  C. difficile colitis  Plan: Hemodialysis  MWF schedule PermCath removal as outpatient, as AV cannulation was not successful inpatient Right chest thoracentesis - 1400 cc removed. Able to breath better.  Patient declined Pleurx catheter Monitor Hgb and Phos Next hemodialysis on Monday Treatment for C. difficile infection as per primary team    LOS: [place] 5/5/20193:12 PM  [place], [place]  Note: This note was prepared with Dragon dictation. Any transcription errors are unintentional

## 2017-05-30 NOTE — Progress Notes (Signed)
Patient ID: Sharon [name], female   DOB: Oct 22, 1926, 84 y.o.   MRN: [id number]   [employer] PROGRESS NOTE  RAILYN HOUSE SWF:[id number] DOB: 1926-06-11 DOA: 05/24/2017 PCP: Kirk Ruths, MD  HPI/Subjective: Patient breathing is improved today still having diarrhea   Objective: Vitals:   05/30/17 0532 05/30/17 1221  BP: (!) 142/64 139/69  Pulse: 63 66  Resp: 19 19  Temp: 97.9 F (36.6 C) 97.9 F (36.6 C)  SpO2: 100% 100%    Filed Weights   05/26/17 1346 05/28/17 2015 05/28/17 2339  Weight: 42.7 kg (94 lb 2.2 oz) 47.3 kg (104 lb 4.4 oz) 47.2 kg (104 lb 0.9 oz)    ROS: Review of Systems  Constitutional: Negative for chills and fever.  Eyes: Negative for blurred vision.  Respiratory: Positive for cough and shortness of breath.   Cardiovascular: Negative for chest pain.  Gastrointestinal: Positive for diarrhea. Negative for abdominal pain, constipation, nausea and vomiting.  Genitourinary: Negative for dysuria.  Musculoskeletal: Negative for joint pain.  Neurological: Negative for dizziness and headaches.   Exam: Physical Exam  Constitutional: She is oriented to person, place, and time.  HENT:  Nose: No mucosal edema.  Mouth/Throat: No oropharyngeal exudate or posterior oropharyngeal edema.  Eyes: Pupils are equal, round, and reactive to light. Conjunctivae, EOM and lids are normal.  Neck: No JVD present. Carotid bruit is not present. No edema present. No thyroid mass and no thyromegaly present.  Cardiovascular: S1 normal and S2 normal. Exam reveals no gallop.  Murmur heard.  Systolic murmur is present with a grade of 2/6. Pulses:      Dorsalis pedis pulses are 2+ on the right side, and 2+ on the left side.  Respiratory: No respiratory distress. She has decreased breath sounds in the right lower field. She has no wheezes. She has rhonchi in the right lower field and the left lower field. She has no rales.  GI: Soft. Bowel sounds are normal. There is no  tenderness.  Musculoskeletal:       Right ankle: She exhibits no swelling.       Left ankle: She exhibits no swelling.  Lymphadenopathy:    She has no cervical adenopathy.  Neurological: She is alert and oriented to person, place, and time. No cranial nerve deficit.  Skin: Skin is warm. Nails show no clubbing.  Psychiatric: She has a normal mood and affect.      Data Reviewed: Basic Metabolic Panel: Recent Labs  Lab 05/24/17 1908 05/25/17 0538 05/26/17 1041 05/30/17 0555  NA 134* 133*  --  133*  K 3.8 3.8  --  4.5  CL 94* 97*  --  96*  CO2 27 25  --  25  GLUCOSE 74 90  --  98  BUN 19 27*  --  54*  CREATININE 2.44* 3.16*  --  3.25*  CALCIUM 8.9 8.4*  --  8.5*  PHOS  --   --  4.7*  --    Liver Function Tests: Recent Labs  Lab 05/24/17 1908  AST 39  ALT 20  ALKPHOS 132*  BILITOT 1.7*  PROT 7.3  ALBUMIN 3.5   CBC: Recent Labs  Lab 05/24/17 1908 05/25/17 0538 05/30/17 0555  WBC 6.7 6.2 8.0  NEUTROABS 5.0  --   --   HGB 13.0 11.5* 11.3*  HCT 39.1 34.3* 33.9*  MCV 90.1 90.3 90.0  PLT 203 196 219   Cardiac Enzymes: Recent Labs  Lab 05/24/17 1908  TROPONINI 0.12*  Recent Results (from the past 240 hour(s))  Blood culture (routine x 2)     Status: None   Collection Time: 05/24/17  7:08 PM  Result Value Ref Range Status   Specimen Description BLOOD L FA  Final   Special Requests   Final    BOTTLES DRAWN AEROBIC AND ANAEROBIC Blood Culture adequate volume   Culture   Final    NO GROWTH 5 DAYS Performed at [facility], [address] [place], [place] [address]    Report Status 05/29/2017 FINAL  Final  Blood culture (routine x 2)     Status: None   Collection Time: 05/24/17  7:11 PM  Result Value Ref Range Status   Specimen Description BLOOD BLOOD LEFT WRIST  Final   Special Requests   Final    BOTTLES DRAWN AEROBIC AND ANAEROBIC Blood Culture adequate volume   Culture   Final    NO GROWTH 5 DAYS Performed at [facility],  [address] [place], [place] [address]    Report Status 05/29/2017 FINAL  Final  MRSA PCR Screening     Status: Abnormal   Collection Time: 05/25/17  5:02 AM  Result Value Ref Range Status   MRSA by PCR POSITIVE (A) NEGATIVE Final    Comment:        The GeneXpert MRSA Assay (FDA approved for NASAL specimens only), is one component of a comprehensive MRSA colonization surveillance program. It is not intended to diagnose MRSA infection nor to guide or monitor treatment for MRSA infections. RESULT CALLED TO, READ BACK BY AND VERIFIED WITH: MARCELLA TURNER AT [place] ON 05/25/2017 JJB Performed at [facility], [address], [place], [place] [address]   Body fluid culture     Status: None   Collection Time: 05/25/17  3:15 PM  Result Value Ref Range Status   Specimen Description   Final    PLEURAL Performed at [facility], [address], [place], [place] [address]    Special Requests   Final    NONE Performed at [facility], [place], [place] [address]    Gram Stain NO WBC SEEN NO ORGANISMS SEEN   Final   Culture   Final    NO GROWTH 3 DAYS Performed at [facility], [place] [address] [place],  [address]    Report Status 05/29/2017 FINAL  Final  Gastrointestinal Panel by PCR , Stool     Status: None   Collection Time: 05/26/17  5:05 PM  Result Value Ref Range Status   Campylobacter species NOT DETECTED NOT DETECTED Final   Plesimonas shigelloides NOT DETECTED NOT DETECTED Final   Salmonella species NOT DETECTED NOT DETECTED Final   Yersinia enterocolitica NOT DETECTED NOT DETECTED Final   Vibrio species NOT DETECTED NOT DETECTED Final   Vibrio cholerae NOT DETECTED NOT DETECTED Final   Enteroaggregative E coli (EAEC) NOT DETECTED NOT DETECTED Final   Enteropathogenic E coli (EPEC) NOT DETECTED NOT DETECTED Final   Enterotoxigenic E coli (ETEC) NOT DETECTED NOT DETECTED Final   Shiga like toxin producing E  coli (STEC) NOT DETECTED NOT DETECTED Final   Shigella/Enteroinvasive E coli (EIEC) NOT DETECTED NOT DETECTED Final   Cryptosporidium NOT DETECTED NOT DETECTED Final   Cyclospora cayetanensis NOT DETECTED NOT DETECTED Final   Entamoeba histolytica NOT DETECTED NOT DETECTED Final   Giardia lamblia NOT DETECTED NOT DETECTED Final   Adenovirus F40/41 NOT DETECTED NOT DETECTED Final  Astrovirus NOT DETECTED NOT DETECTED Final   Norovirus GI/GII NOT DETECTED NOT DETECTED Final   Rotavirus A NOT DETECTED NOT DETECTED Final   Sapovirus (I, II, IV, and V) NOT DETECTED NOT DETECTED Final    Comment: Performed at [facility], [address] [place], [place] [address]  C difficile quick scan w PCR reflex     Status: Abnormal   Collection Time: 05/26/17  5:05 PM  Result Value Ref Range Status   C Diff antigen POSITIVE (A) NEGATIVE Final   C Diff toxin NEGATIVE NEGATIVE Final   C Diff interpretation Results are indeterminate. See PCR results.  Final    Comment: Performed at [facility], [address] [place], [place] [address]  C. Diff by PCR, Reflexed     Status: Abnormal   Collection Time: 05/26/17  5:05 PM  Result Value Ref Range Status   Toxigenic C. Difficile by PCR POSITIVE (A) NEGATIVE Final    Comment: Positive for toxigenic C. difficile with little to no toxin production. Only treat if clinical presentation suggests symptomatic illness. Performed at [facility], [address] [place], [place] [address]       Scheduled Meds: . amoxicillin-clavulanate  500 mg Oral QHS  . B-complex with vitamin C  1 tablet Oral Daily  . benzonatate  200 mg Oral TID  . Chlorhexidine Gluconate Cloth  6 each Topical Q0600  . doxycycline  100 mg Oral Q12H  . feeding supplement  1 Container Oral TID BM  . feeding supplement (PRO-STAT SUGAR FREE 64)  30 mL Oral BID  . guaiFENesin  600 mg Oral BID  . heparin  5,000 Units Subcutaneous Q8H  . levothyroxine  25 mcg Oral  QAC breakfast  . metoprolol succinate  25 mg Oral Daily  . multivitamin  1 tablet Oral Daily  . multivitamin-lutein  1 capsule Oral Daily  . ramipril  2.5 mg Oral QHS  . sertraline  150 mg Oral QHS  . vancomycin  125 mg Oral Q6H   Continuous Infusions:   Assessment/Plan:  1. Healthcare associated pneumonia started on vancomycin and cefepime.   Currently on oral Augmentin,  thoracentesis culture negative. 2. C. difficile colitis.  Continue oral vancomycin.   3. Large right pleural effusion.  Status post thoracentesis removing 1.4 L.   So far cultures are negative. 4. Acute encephalopathy seems to have improved.  Could be medication and infection related waxing and waning 5. End-stage renal disease on dialysis as per nephrology.  Dialysis today on her usual Monday Wednesday Friday schedule. 6. Acute on chronic systolic congestive heart failure.  Limited with medication secondary to hypotension.  Patient on low-dose metoprolol and ramipril. 7. Hypothyroidism unspecified on levothyroxine 8. Malnutrition on Ensure 9. Depression on Zoloft  Code Status:     Code Status Orders  (From admission, onward)        Start     Ordered   05/24/17 2259  Do not attempt resuscitation (DNR)  Continuous    Question Answer Comment  In the event of cardiac or respiratory ARREST Do not call a "code blue"   In the event of cardiac or respiratory ARREST Do not perform Intubation, CPR, defibrillation or ACLS   In the event of cardiac or respiratory ARREST Use medication by any route, position, wound care, and other measures to relive pain and suffering. May use oxygen, suction and manual treatment of airway obstruction as needed for comfort.      05/24/17  2258    Code Status History    Date Active Date Inactive Code Status Order ID Comments User Context   02/16/2017 1928 02/17/2017 1949 Full Code [id number]  Corky Mull, MD Inpatient   02/03/2017 1230 02/04/2017 1907 DNR [id number]  Nicholes Mango, MD ED    01/01/2017 2027 01/08/2017 1858 Full Code [id number]  [name], MD ED   05/12/2016 0809 05/14/2016 1439 DNR [id number]  Max Sane, MD Inpatient   05/10/2016 0506 05/12/2016 0809 Full Code [id number]  Saundra Shelling, MD Inpatient   12/15/2015 1515 12/19/2015 0159 Full Code [id number]  Oletta Cohn, DO Inpatient   12/14/2015 1613 12/15/2015 1515 Full Code [id number]  Bettey Costa, MD Inpatient    Advance Directive Documentation     Most Recent Value  Type of Advance Directive  Healthcare Power of Attorney  Pre-existing out of facility DNR order (yellow form or pink MOST form)  -  "MOST" Form in Place?  -     Disposition Plan:  diarrhea will need to settle down prior to disposition  Consultants:  Nephrology  Cardiothoracic surgery  Palliative care  Procedures:  Right-sided thoracentesis  Antibiotics:  doxycycline  Augmentin  Oral vancomycin  Time spent: 28 minutes.  Yashua Bracco [facility]

## 2017-05-31 LAB — RENAL FUNCTION PANEL
ANION GAP: 14 (ref 5–15)
Albumin: 3.2 g/dL — ABNORMAL LOW (ref 3.5–5.0)
BUN: 78 mg/dL — ABNORMAL HIGH (ref 6–20)
CO2: 24 mmol/L (ref 22–32)
Calcium: 8.8 mg/dL — ABNORMAL LOW (ref 8.9–10.3)
Chloride: 95 mmol/L — ABNORMAL LOW (ref 101–111)
Creatinine, Ser: 4.01 mg/dL — ABNORMAL HIGH (ref 0.44–1.00)
GFR calc Af Amer: 11 mL/min — ABNORMAL LOW (ref >60)
GFR calc non Af Amer: 9 mL/min — ABNORMAL LOW (ref >60)
Glucose, Bld: 91 mg/dL (ref 65–99)
POTASSIUM: 4.8 mmol/L (ref 3.5–5.1)
Phosphorus: 5.2 mg/dL — ABNORMAL HIGH (ref 2.5–4.6)
Sodium: 133 mmol/L — ABNORMAL LOW (ref 135–145)

## 2017-05-31 MED ORDER — SEVELAMER CARBONATE 800 MG PO TABS
800.0000 mg | ORAL_TABLET | Freq: Three times a day (TID) | ORAL | Status: DC
  Administered 2017-06-01: 800 mg via ORAL
  Filled 2017-05-31: qty 1

## 2017-05-31 NOTE — Care Management Important Message (Signed)
Important Message  Patient Details  Name: KEMAYA DORNER MRN: [id number] Date of Birth: 1926-05-29   Medicare Important Message Given:  Other (see comment)  There is no notice in record from admission.  Patient is not able to understand explanations of notice.  Left message for patient's brother Mr Dawna Part that notice is in patient's room and signature is needed.  Katrina Stack, RN 05/31/2017, 4:48 PM

## 2017-05-31 NOTE — Progress Notes (Signed)
[facility], [place] 05/31/17  Subjective:  Patient appears to be doing better.  She is currently awake, alert, and responsive. Doing better after thoracentesis. She will be due for dialysis later today.   Objective:  Vital signs in last 24 hours:  Temp:  [97.9 F (36.6 C)-98 F (36.7 C)] 97.9 F (36.6 C) (05/06 0524) Pulse Rate:  [63-70] 63 (05/06 0524) Resp:  [18-20] 18 (05/06 0524) BP: (126-137)/(69-71) 126/71 (05/06 0524) SpO2:  [97 %-100 %] 100 % (05/06 0524)  Weight change:  Filed Weights   05/26/17 1346 05/28/17 2015 05/28/17 2339  Weight: 42.7 kg (94 lb 2.2 oz) 47.3 kg (104 lb 4.4 oz) 47.2 kg (104 lb 0.9 oz)    Intake/Output:   No intake or output data in the 24 hours ending 05/31/17 1344   Physical Exam: General:  Frail, elderly, sitting up in chair  HEENT  anicteric, moist mucous membranes  Neck  supple  Pulm/lungs  normal breathing effort, decreased breath sounds at right base  CVS/Heart  regular, no rub  Abdomen:   Soft NTND  Extremities:  Trace edema  Neurologic:  Alert, able to follow commands  Skin:  Multiple ecchymoses  Access:  Right forearm AV fistula, left IJ PermCath       Basic Metabolic Panel:  Recent Labs  Lab 05/24/17 1908 05/25/17 0538 05/26/17 1041 05/30/17 0555 05/31/17 0434  NA 134* 133*  --  133* 133*  K 3.8 3.8  --  4.5 4.8  CL 94* 97*  --  96* 95*  CO2 27 25  --  25 24  GLUCOSE 74 90  --  98 91  BUN 19 27*  --  54* 78*  CREATININE 2.44* 3.16*  --  3.25* 4.01*  CALCIUM 8.9 8.4*  --  8.5* 8.8*  PHOS  --   --  4.7*  --  5.2*     CBC: Recent Labs  Lab 05/24/17 1908 05/25/17 0538 05/30/17 0555  WBC 6.7 6.2 8.0  NEUTROABS 5.0  --   --   HGB 13.0 11.5* 11.3*  HCT 39.1 34.3* 33.9*  MCV 90.1 90.3 90.0  PLT 203 196 219      Lab Results  Component Value Date   HEPBSAG Negative 05/26/2017      Microbiology:  Recent Results (from the past 240 hour(s))  Blood culture (routine x 2)      Status: None   Collection Time: 05/24/17  7:08 PM  Result Value Ref Range Status   Specimen Description BLOOD L FA  Final   Special Requests   Final    BOTTLES DRAWN AEROBIC AND ANAEROBIC Blood Culture adequate volume   Culture   Final    NO GROWTH 5 DAYS Performed at [facility], [address] [place], [place] [address]    Report Status 05/29/2017 FINAL  Final  Blood culture (routine x 2)     Status: None   Collection Time: 05/24/17  7:11 PM  Result Value Ref Range Status   Specimen Description BLOOD BLOOD LEFT WRIST  Final   Special Requests   Final    BOTTLES DRAWN AEROBIC AND ANAEROBIC Blood Culture adequate volume   Culture   Final    NO GROWTH 5 DAYS Performed at [facility], [address] [place], [place] [address]    Report Status 05/29/2017 FINAL  Final  MRSA PCR Screening     Status: Abnormal   Collection Time: 05/25/17  5:02 AM  Result Value Ref Range Status   MRSA by PCR POSITIVE (A) NEGATIVE Final    Comment:        The GeneXpert MRSA Assay (FDA approved for NASAL specimens only), is one component of a comprehensive MRSA colonization surveillance program. It is not intended to diagnose MRSA infection nor to guide or monitor treatment for MRSA infections. RESULT CALLED TO, READ BACK BY AND VERIFIED WITH: MARCELLA TURNER AT [place] ON 05/25/2017 JJB Performed at [facility], [address] [place], [place] [address]   Body fluid culture     Status: None   Collection Time: 05/25/17  3:15 PM  Result Value Ref Range Status   Specimen Description   Final    PLEURAL Performed at [facility], [address] [place], [place] [address]    Special Requests   Final    NONE Performed at [facility], [place], [place] [address]    Gram Stain NO WBC SEEN NO ORGANISMS SEEN   Final   Culture   Final    NO GROWTH 3 DAYS Performed at [facility], [place] [address] [place], [place]  [address]    Report Status 05/29/2017 FINAL  Final  Gastrointestinal Panel by PCR , Stool     Status: None   Collection Time: 05/26/17  5:05 PM  Result Value Ref Range Status   Campylobacter species NOT DETECTED NOT DETECTED Final   Plesimonas shigelloides NOT DETECTED NOT DETECTED Final   Salmonella species NOT DETECTED NOT DETECTED Final   Yersinia enterocolitica NOT DETECTED NOT DETECTED Final   Vibrio species NOT DETECTED NOT DETECTED Final   Vibrio cholerae NOT DETECTED NOT DETECTED Final   Enteroaggregative E coli (EAEC) NOT DETECTED NOT DETECTED Final   Enteropathogenic E coli (EPEC) NOT DETECTED NOT DETECTED Final   Enterotoxigenic E coli (ETEC) NOT DETECTED NOT DETECTED Final   Shiga like toxin producing E coli (STEC) NOT DETECTED NOT DETECTED Final   Shigella/Enteroinvasive E coli (EIEC) NOT DETECTED NOT DETECTED Final   Cryptosporidium NOT DETECTED NOT DETECTED Final   Cyclospora cayetanensis NOT DETECTED NOT DETECTED Final   Entamoeba histolytica NOT DETECTED NOT DETECTED Final   Giardia lamblia NOT DETECTED NOT DETECTED Final   Adenovirus F40/41 NOT DETECTED NOT DETECTED Final   Astrovirus NOT DETECTED NOT DETECTED Final   Norovirus GI/GII NOT DETECTED NOT DETECTED Final   Rotavirus A NOT DETECTED NOT DETECTED Final   Sapovirus (I, II, IV, and V) NOT DETECTED NOT DETECTED Final    Comment: Performed at [facility], [address] [place], [place] [address]  C difficile quick scan w PCR reflex     Status: Abnormal   Collection Time: 05/26/17  5:05 PM  Result Value Ref Range Status   C Diff antigen POSITIVE (A) NEGATIVE Final   C Diff toxin NEGATIVE NEGATIVE Final   C Diff interpretation Results are indeterminate. See PCR results.  Final    Comment: Performed at [facility], [address] [place], [place] [address]  C. Diff by PCR, Reflexed     Status: Abnormal   Collection Time: 05/26/17  5:05 PM  Result Value Ref Range Status   Toxigenic C.  Difficile by PCR POSITIVE (A) NEGATIVE Final    Comment: Positive for toxigenic C. difficile with little to no toxin production. Only treat if clinical presentation suggests symptomatic illness. Performed at [facility], [address], [place], [place] [address]  Coagulation Studies: No results for input(s): LABPROT, INR in the last 72 hours.  Urinalysis: No results for input(s): COLORURINE, LABSPEC, PHURINE, GLUCOSEU, HGBUR, BILIRUBINUR, KETONESUR, PROTEINUR, UROBILINOGEN, NITRITE, LEUKOCYTESUR in the last 72 hours.  Invalid input(s): APPERANCEUR    Imaging: No results found.   Medications:    . amoxicillin-clavulanate  500 mg Oral QHS  . B-complex with vitamin C  1 tablet Oral Daily  . benzonatate  200 mg Oral TID  . doxycycline  100 mg Oral Q12H  . feeding supplement  1 Container Oral TID BM  . feeding supplement (PRO-STAT SUGAR FREE 64)  30 mL Oral BID  . guaiFENesin  600 mg Oral BID  . heparin  5,000 Units Subcutaneous Q8H  . levothyroxine  25 mcg Oral QAC breakfast  . metoprolol succinate  25 mg Oral Daily  . multivitamin  1 tablet Oral Daily  . multivitamin-lutein  1 capsule Oral Daily  . ramipril  2.5 mg Oral QHS  . sertraline  150 mg Oral QHS  . vancomycin  125 mg Oral Q6H   acetaminophen, docusate sodium, guaiFENesin-dextromethorphan, metoCLOPramide, ondansetron (ZOFRAN) IV, polyethylene glycol, witch hazel-glycerin  Assessment/ Plan:  84 y.o. female  with end stage renal disease on hemodialysis, coronary artery disease, pacemaker placement, meniere's disease, congestive heart failure, peripheral vascular disease, multiple falls and fractures.   MWF [facility]   1.  End-stage renal disease 2.  Anemia of chronic kidney disease 3.  Secondary hyperparathyroidism 4.  Right pleural effusion, possible pneumonia 5.  C. difficile colitis  Plan: Patient due for hemodialysis today at the bedside as she is C. difficile positive.  Dialysis  orders have been prepared.  She will be maintained on vancomycin 125 mg p.o. every 6 hours for treatment of the C. difficile colitis.  The patient's hemoglobin is currently 11.3.  Epogen currently on hold.  We also recommend periodic monitoring of bone mineral metabolism parameters during hospitalization.    LOS: 7 Anthonette Legato 5/6/20191:44 PM  [place], [place]  Note: This note was prepared with Dragon dictation. Any transcription errors are unintentional

## 2017-05-31 NOTE — Progress Notes (Signed)
Hd started  

## 2017-05-31 NOTE — Progress Notes (Signed)
Nutrition Follow Up Note   DOCUMENTATION CODES:   Severe malnutrition in context of chronic illness  INTERVENTION:   Prostat liquid protein PO 30 ml BID with meals, each supplement provides 100 kcal, 15 grams protein.  B-complex with C daily   Boost Breeze po TID, each supplement provides 250 kcal and 9 grams of protein  Rena-vite daily  Ocuvite daily for wound healing (provides zinc, vitamin A, vitamin C, Vitamin E, copper, and selenium)  NUTRITION DIAGNOSIS:   Severe Malnutrition related to chronic illness(ESRD on HD, CHF) as evidenced by 12 percent weight loss 2 months, severe fat depletion, severe muscle depletion.  GOAL:   Patient will meet greater than or equal to 90% of their needs  -progressing  MONITOR:   PO intake, Supplement acceptance, Labs, Weight trends, Skin, I & O's  ASSESSMENT:   84 y.o. female with a known history of nemia, coronary artery disease, skin cancer, cardiomyopathy, CHF with ejection fraction 20%, irritable bowel syndrome, myocardial infarction, obstructive sleep apnea, end-stage renal disease on hemodialysis- lives in [place] for last 1-1/2 years since she had hip surgery and she is mostly bedbound. Pt admitted for CHF   Pt with improved appetite and oral intake; pt eating 50-90% of meals and drinking supplements. Per chart, pt with wt gain since admit. Pt s/p thoracentesis 4/30 with 1440ml fluid removal. Continue supplements and vitamins. HD today.      Medications reviewed and include: Augmentin, B-Complex with C, doxycycline, heparin, synthroid, rena-vite, ocuvite, renvela, vancomycin, zofran   Labs reviewed: Na 133(L), Cl 95(L), BUN 78(H), creat 4.01(H), Ca 8.8(L), P 5.2(H)  Diet Order:   Diet Order           DIET DYS 2 Room service appropriate? Yes; Fluid consistency: Thin  Diet effective now         EDUCATION NEEDS:   Education needs have been addressed  Skin:  Skin Assessment: (ecchymosis, Stage I coccyx )  Last BM:   5/5 type 7   Height:   Ht Readings from Last 1 Encounters:  05/24/17 5\' 4"  (1.626 m)    Weight:   Wt Readings from Last 1 Encounters:  05/31/17 106 lb 7.7 oz (48.3 kg)    Ideal Body Weight:  54.5 kg  BMI:  Body mass index is 18.28 kg/m.  Estimated Nutritional Needs:   Kcal:  1200-1400kcal/day   Protein:  63-73g/day   Fluid:  >1.0L/day   Koleen Distance MS, RD, LDN Pager #[phone] After Hours Pager: [phone]

## 2017-05-31 NOTE — Progress Notes (Signed)
Hd completed 

## 2017-05-31 NOTE — Progress Notes (Signed)
Patient ID: Sharon [name], female   DOB: May 09, 1926, 84 y.o.   MRN: [id number]   [facility] PROGRESS NOTE  Sharon [name] JJO:[id number] DOB: [birth year] DOA: 05/24/2017 PCP: Kirk Ruths, MD  HPI/Subjective: Cough and shortness of breath improve still having diarrhea   Objective: Vitals:   05/31/17 1445 05/31/17 1500  BP: (!) 126/55 (!) 129/45  Pulse: 60 61  Resp: 18 18  Temp:    SpO2:      Filed Weights   05/28/17 2015 05/28/17 2339 05/31/17 1400  Weight: 47.3 kg (104 lb 4.4 oz) 47.2 kg (104 lb 0.9 oz) 48.3 kg (106 lb 7.7 oz)    ROS: Review of Systems  Constitutional: Negative for chills and fever.  Eyes: Negative for blurred vision.  Respiratory: Negative for cough and shortness of breath.   Cardiovascular: Negative for chest pain.  Gastrointestinal: Positive for diarrhea. Negative for abdominal pain, constipation, nausea and vomiting.  Genitourinary: Negative for dysuria.  Musculoskeletal: Negative for joint pain.  Neurological: Negative for dizziness and headaches.   Exam: Physical Exam  Constitutional: She is oriented to person, place, and time.  HENT:  Nose: No mucosal edema.  Mouth/Throat: No oropharyngeal exudate or posterior oropharyngeal edema.  Eyes: Pupils are equal, round, and reactive to light. Conjunctivae, EOM and lids are normal.  Neck: No JVD present. Carotid bruit is not present. No edema present. No thyroid mass and no thyromegaly present.  Cardiovascular: S1 normal and S2 normal. Exam reveals no gallop.  Murmur heard.  Systolic murmur is present with a grade of 2/6. Pulses:      Dorsalis pedis pulses are 2+ on the right side, and 2+ on the left side.  Respiratory: No respiratory distress. She has decreased breath sounds in the right lower field. She has no wheezes. She has rhonchi in the right lower field and the left lower field. She has no rales.  GI: Soft. Bowel sounds are normal. There is no tenderness.  Musculoskeletal:    Right ankle: She exhibits no swelling.       Left ankle: She exhibits no swelling.  Lymphadenopathy:    She has no cervical adenopathy.  Neurological: She is alert and oriented to person, place, and time. No cranial nerve deficit.  Skin: Skin is warm. Nails show no clubbing.  Psychiatric: She has a normal mood and affect.      Data Reviewed: Basic Metabolic Panel: Recent Labs  Lab 05/24/17 1908 05/25/17 0538 05/26/17 1041 05/30/17 0555 05/31/17 0434  NA 134* 133*  --  133* 133*  K 3.8 3.8  --  4.5 4.8  CL 94* 97*  --  96* 95*  CO2 27 25  --  25 24  GLUCOSE 74 90  --  98 91  BUN 19 27*  --  54* 78*  CREATININE 2.44* 3.16*  --  3.25* 4.01*  CALCIUM 8.9 8.4*  --  8.5* 8.8*  PHOS  --   --  4.7*  --  5.2*   Liver Function Tests: Recent Labs  Lab 05/24/17 1908 05/31/17 0434  AST 39  --   ALT 20  --   ALKPHOS 132*  --   BILITOT 1.7*  --   PROT 7.3  --   ALBUMIN 3.5 3.2*   CBC: Recent Labs  Lab 05/24/17 1908 05/25/17 0538 05/30/17 0555  WBC 6.7 6.2 8.0  NEUTROABS 5.0  --   --   HGB 13.0 11.5* 11.3*  HCT 39.1 34.3* 33.9*  MCV  90.1 90.3 90.0  PLT 203 196 219   Cardiac Enzymes: Recent Labs  Lab 05/24/17 1908  TROPONINI 0.12*     Recent Results (from the past 240 hour(s))  Blood culture (routine x 2)     Status: None   Collection Time: 05/24/17  7:08 PM  Result Value Ref Range Status   Specimen Description BLOOD L FA  Final   Special Requests   Final    BOTTLES DRAWN AEROBIC AND ANAEROBIC Blood Culture adequate volume   Culture   Final    NO GROWTH 5 DAYS Performed at [facility], [address] [place], [place] [address]    Report Status 05/29/2017 FINAL  Final  Blood culture (routine x 2)     Status: None   Collection Time: 05/24/17  7:11 PM  Result Value Ref Range Status   Specimen Description BLOOD BLOOD LEFT WRIST  Final   Special Requests   Final    BOTTLES DRAWN AEROBIC AND ANAEROBIC Blood Culture adequate volume   Culture    Final    NO GROWTH 5 DAYS Performed at [facility] [address] [place], [place] [address]    Report Status 05/29/2017 FINAL  Final  MRSA PCR Screening     Status: Abnormal   Collection Time: 05/25/17  5:02 AM  Result Value Ref Range Status   MRSA by PCR POSITIVE (A) NEGATIVE Final    Comment:        The GeneXpert MRSA Assay (FDA approved for NASAL specimens only), is one component of a comprehensive MRSA colonization surveillance program. It is not intended to diagnose MRSA infection nor to guide or monitor treatment for MRSA infections. RESULT CALLED TO, READ BACK BY AND VERIFIED WITH: MARCELLA TURNER AT [place] ON 05/25/2017 JJB Performed at [facility], [address], [place], [place] [address]   Body fluid culture     Status: None   Collection Time: 05/25/17  3:15 PM  Result Value Ref Range Status   Specimen Description   Final    PLEURAL Performed at [facility], [address] [place], [place] [address]    Special Requests   Final    NONE Performed at [facility], [place], [place] [address]    Gram Stain NO WBC SEEN NO ORGANISMS SEEN   Final   Culture   Final    NO GROWTH 3 DAYS Performed at [facility], [place] [address] [place], [place] [address]    Report Status 05/29/2017 FINAL  Final  Gastrointestinal Panel by PCR , Stool     Status: None   Collection Time: 05/26/17  5:05 PM  Result Value Ref Range Status   Campylobacter species NOT DETECTED NOT DETECTED Final   Plesimonas shigelloides NOT DETECTED NOT DETECTED Final   Salmonella species NOT DETECTED NOT DETECTED Final   Yersinia enterocolitica NOT DETECTED NOT DETECTED Final   Vibrio species NOT DETECTED NOT DETECTED Final   Vibrio cholerae NOT DETECTED NOT DETECTED Final   Enteroaggregative E coli (EAEC) NOT DETECTED NOT DETECTED Final   Enteropathogenic E coli (EPEC) NOT DETECTED NOT DETECTED Final   Enterotoxigenic E coli (ETEC) NOT  DETECTED NOT DETECTED Final   Shiga like toxin producing E coli (STEC) NOT DETECTED NOT DETECTED Final   Shigella/Enteroinvasive E coli (EIEC) NOT DETECTED NOT DETECTED Final   Cryptosporidium NOT DETECTED NOT DETECTED Final   Cyclospora cayetanensis NOT DETECTED NOT DETECTED Final   Entamoeba  histolytica NOT DETECTED NOT DETECTED Final   Giardia lamblia NOT DETECTED NOT DETECTED Final   Adenovirus F40/41 NOT DETECTED NOT DETECTED Final   Astrovirus NOT DETECTED NOT DETECTED Final   Norovirus GI/GII NOT DETECTED NOT DETECTED Final   Rotavirus A NOT DETECTED NOT DETECTED Final   Sapovirus (I, II, IV, and V) NOT DETECTED NOT DETECTED Final    Comment: Performed at [facility], [address] [place], [place] [address]  C difficile quick scan w PCR reflex     Status: Abnormal   Collection Time: 05/26/17  5:05 PM  Result Value Ref Range Status   C Diff antigen POSITIVE (A) NEGATIVE Final   C Diff toxin NEGATIVE NEGATIVE Final   C Diff interpretation Results are indeterminate. See PCR results.  Final    Comment: Performed at [facility], [address] [place], [place] [address]  C. Diff by PCR, Reflexed     Status: Abnormal   Collection Time: 05/26/17  5:05 PM  Result Value Ref Range Status   Toxigenic C. Difficile by PCR POSITIVE (A) NEGATIVE Final    Comment: Positive for toxigenic C. difficile with little to no toxin production. Only treat if clinical presentation suggests symptomatic illness. Performed at [facility], [address] [place], [place] [address]       Scheduled Meds: . amoxicillin-clavulanate  500 mg Oral QHS  . B-complex with vitamin C  1 tablet Oral Daily  . benzonatate  200 mg Oral TID  . doxycycline  100 mg Oral Q12H  . feeding supplement  1 Container Oral TID BM  . feeding supplement (PRO-STAT SUGAR FREE 64)  30 mL Oral BID  . guaiFENesin  600 mg Oral BID  . heparin  5,000 Units Subcutaneous Q8H  . levothyroxine  25 mcg  Oral QAC breakfast  . metoprolol succinate  25 mg Oral Daily  . multivitamin  1 tablet Oral Daily  . multivitamin-lutein  1 capsule Oral Daily  . ramipril  2.5 mg Oral QHS  . sertraline  150 mg Oral QHS  . sevelamer carbonate  800 mg Oral TID WC  . vancomycin  125 mg Oral Q6H   Continuous Infusions:   Assessment/Plan:  1. Healthcare associated pneumonia started on vancomycin and cefepime.  Stop Augmentin 2. C. difficile colitis.  Continue oral vancomycin.   3. Large right pleural effusion.  Status post thoracentesis removing 1.4 L.   So far cultures are negative. 4. Acute encephalopathy seems to have improved.  Could be medication and infection related waxing and waning 5. End-stage renal disease on dialysis as per nephrology.  Dialysis today on her usual Monday Wednesday Friday schedule. 6. Acute on chronic systolic congestive heart failure.  Limited with medication secondary to hypotension.  Patient on low-dose metoprolol and ramipril. 7. Hypothyroidism unspecified on levothyroxine 8. Malnutrition on Ensure 9. Depression on Zoloft  Code Status:     Code Status Orders  (From admission, onward)        Start     Ordered   05/24/17 2259  Do not attempt resuscitation (DNR)  Continuous    Question Answer Comment  In the event of cardiac or respiratory ARREST Do not call a "code blue"   In the event of cardiac or respiratory ARREST Do not perform Intubation, CPR, defibrillation or ACLS   In the event of cardiac or respiratory ARREST Use medication by any route, position, wound care, and other measures to relive pain and suffering. May  use oxygen, suction and manual treatment of airway obstruction as needed for comfort.      05/24/17 2258    Code Status History    Date Active Date Inactive Code Status Order ID Comments User Context   02/16/2017 1928 02/17/2017 1949 Full Code [id number]  Corky Mull, MD Inpatient   02/03/2017 1230 02/04/2017 1907 DNR [id number]  Nicholes Mango, MD ED    01/01/2017 2027 01/08/2017 1858 Full Code [id number]  [name], MD ED   05/12/2016 0809 05/14/2016 1439 DNR [id number]  Max Sane, MD Inpatient   05/10/2016 0506 05/12/2016 0809 Full Code [id number]  Saundra Shelling, MD Inpatient   12/15/2015 1515 12/19/2015 0159 Full Code [id number]  Oletta Cohn, DO Inpatient   12/14/2015 1613 12/15/2015 1515 Full Code [id number]  Bettey Costa, MD Inpatient    Advance Directive Documentation     Most Recent Value  Type of Advance Directive  Healthcare Power of Attorney  Pre-existing out of facility DNR order (yellow form or pink MOST form)  -  "MOST" Form in Place?  -     Disposition Plan:  diarrhea will need to settle down prior to disposition  Consultants:  Nephrology  Cardiothoracic surgery  Palliative care  Procedures:  Right-sided thoracentesis  Antibiotics:  doxycycline  Augmentin  Oral vancomycin  Time spent: 28 minutes.  Gemini Beaumier [facility]

## 2017-06-01 MED ORDER — GUAIFENESIN-DM 100-10 MG/5ML PO SYRP: 5.0000 mL | ORAL_SOLUTION | ORAL | 0 refills | Status: DC | PRN

## 2017-06-01 MED ORDER — METOPROLOL SUCCINATE ER 25 MG PO TB24: 25.0000 mg | ORAL_TABLET | Freq: Every day | ORAL | Status: DC

## 2017-06-01 MED ORDER — TRAMADOL HCL 50 MG PO TABS: 50.0000 mg | ORAL_TABLET | Freq: Four times a day (QID) | ORAL | 0 refills | Status: DC | PRN

## 2017-06-01 MED ORDER — BENZONATATE 200 MG PO CAPS: 200.0000 mg | ORAL_CAPSULE | Freq: Three times a day (TID) | ORAL | 0 refills | Status: DC

## 2017-06-01 MED ORDER — SEVELAMER CARBONATE 800 MG PO TABS: 800.0000 mg | ORAL_TABLET | Freq: Three times a day (TID) | ORAL | 0 refills | Status: DC

## 2017-06-01 MED ORDER — VANCOMYCIN 50 MG/ML ORAL SOLUTION: 125.0000 mg | Freq: Four times a day (QID) | ORAL | 0 refills | Status: AC

## 2017-06-01 NOTE — Progress Notes (Signed)
06/01/2017 Kearny Maese to be D/C'd Skilled nursing facility per MD order.  Discussed prescriptions and follow up appointments with the patient. Prescriptions given to patient, medication list explained in detail. Pt verbalized understanding.  Allergies as of 06/01/2017      Reactions   Statins Other (See Comments)   Muscle weakness severe   Codeine Sulfate Nausea And Vomiting   Codeine Other (See Comments)   GI UPSET   Effexor [venlafaxine] Nausea Only   Ezetimibe-simvastatin Other (See Comments)   Muscle weakness      Medication List    STOP taking these medications   diphenoxylate-atropine 2.5-0.025 MG tablet Commonly known as:  LOMOTIL   metoprolol tartrate 25 MG tablet Commonly known as:  LOPRESSOR   polyethylene glycol packet Commonly known as:  MIRALAX / GLYCOLAX   ramipril 2.5 MG capsule Commonly known as:  ALTACE     TAKE these medications   acetaminophen 325 MG tablet Commonly known as:  TYLENOL Take 2 tablets (650 mg total) by mouth every 4 (four) hours as needed for mild pain or moderate pain.   benzonatate 200 MG capsule Commonly known as:  TESSALON Take 1 capsule (200 mg total) by mouth 3 (three) times daily.   feeding supplement (NEPRO CARB STEADY) Liqd Take 237 mLs by mouth 2 (two) times daily between meals.   folic acid-vitamin b complex-vitamin c-selenium-zinc 3 MG Tabs tablet Take 1 tablet by mouth daily.   guaiFENesin-dextromethorphan 100-10 MG/5ML syrup Commonly known as:  ROBITUSSIN DM Take 5 mLs by mouth every 4 (four) hours as needed for cough.   levothyroxine 25 MCG tablet Commonly known as:  SYNTHROID, LEVOTHROID Take 1 tablet (25 mcg total) by mouth daily.   lidocaine 5 % Commonly known as:  LIDODERM Place 1 patch onto the skin daily. Remove & Discard patch within 12 hours or as directed by MD   metoCLOPramide 5 MG tablet Commonly known as:  REGLAN Take 1 tablet (5 mg total) by mouth every 8 (eight) hours as needed  (vertigo).   metoprolol succinate 25 MG 24 hr tablet Commonly known as:  TOPROL-XL Take 1 tablet (25 mg total) by mouth daily.   sertraline 100 MG tablet Commonly known as:  ZOLOFT Take 150 mg by mouth at bedtime.   sevelamer carbonate 800 MG tablet Commonly known as:  RENVELA Take 1 tablet (800 mg total) by mouth 3 (three) times daily with meals.   traMADol 50 MG tablet Commonly known as:  ULTRAM Take 1-2 tablets (50-100 mg total) by mouth every 6 (six) hours as needed for moderate pain.   vancomycin 50 mg/mL oral solution Commonly known as:  VANCOCIN Take 2.5 mLs (125 mg total) by mouth every 6 (six) hours for 7 days.   Vitamin D (Ergocalciferol) 50000 units Caps capsule Commonly known as:  DRISDOL Take 50,000 Units by mouth every Monday.       Vitals:   06/01/17 1011 06/01/17 1334  BP:  (!) 132/50  Pulse:  61  Resp:  18  Temp:  97.7 F (36.5 C)  SpO2: 96% 95%    Skin clean, dry and intact without evidence of skin break down, no evidence of skin tears noted. IV catheter discontinued intact. Site without signs and symptoms of complications. Dressing and pressure applied. Pt denies pain at this time. No complaints noted.  An After Visit Summary was printed and given to the patient. Patient escorted and D/C via EMS Sharon [name]

## 2017-06-01 NOTE — Clinical Social Work Note (Signed)
Patient returning to [facility] today. Patient's brother is aware of discharge. Patient will transport via EMS. Discharge information sent to [facility]. Shela Leff MSW,LcSW [phone]

## 2017-06-01 NOTE — Discharge Summary (Signed)
[employer] - [place] at [facility], 84 y.o., DOB 1926/10/07, MRN [id number]. Admission date: 05/24/2017 Discharge Date 06/01/2017 Primary MD Kirk Ruths, MD Admitting Physician Vaughan Basta, MD  Admission Diagnosis  Pleural effusion [J90] Community acquired pneumonia of right middle lobe of lung [facility]) [J18.1]  Discharge Diagnosis   Principal Problem: Healthcare associated pneumonia C. difficile colitis Large right-sided pleural effusion Acute encephalopathy due to infection End-stage renal disease Acute on chronic systolic CHF Hypothyroidism Malnutrition due to poor caloric intake [place]  is a 84 y.o. female with a known history of nemia, coronary artery disease, skin cancer, cardiomyopathy, CHF with ejection fraction 20%, irritable bowel syndrome, myocardial infarction, obstructive sleep apnea, end-stage renal disease on hemodialysis- lives in [place] for last 1-1/2 years since she had hip surgery and she is mostly bedbound. Patient's was sent to hemodialysis center, where she was very lethargic and short of breath so they called EMS and send her to emergency room. She was noted to have moderate to large right-sided pleural effusion with pneumonia and ER physician started on antibiotic for pneumonia.  Patient was also confused due to the infection.  Patient underwent thoracentesis for the large effusion which showed no evidence of infection was also seen by cardiothoracic surgery.  She was treated with antibiotics subsequently developed diarrhea.  She was noted to have C. difficile colitis which she is currently being treated with oral vancomycin.  Patient still has some diarrhea but much improved.  She is doing much better and stable for discharge.   Patient will need to continue her hemodialysis as previously          Consults  nephrology, ct surgery  Significant Tests:  See  full reports for all details     Dg Chest 1 View  Result Date: 05/25/2017 CLINICAL DATA:  Status post right thoracentesis. EXAM: CHEST  1 VIEW COMPARISON:  05/24/2017 FINDINGS: After thoracentesis there is significant reduction in right pleural fluid volume with small amount of fluid remaining. No pneumothorax following thoracentesis. Atelectasis and/or infiltrate of the underlying right lower lung present. No edema. Stable heart size. Stable appearance of pacemaker and left-sided dialysis catheter. IMPRESSION: Significant reduction in right pleural fluid volume after thoracentesis with no pneumothorax identified. Underlying atelectasis and/or infiltrate of the right lower lung. Electronically Signed   By: Aletta Edouard M.D.   On: 05/25/2017 16:27   Dg Chest 2 View  Result Date: 05/24/2017 CLINICAL DATA:  Tan sputum with lethargy EXAM: CHEST - 2 VIEW COMPARISON:  04/21/2017, 04/07/2017, 02/13/2017 FINDINGS: Moderate to large right pleural effusion. Atelectasis or pneumonia at the right middle lobe and right base. Partial atelectasis medial left base. Cardiomegaly with vascular congestion. Aortic atherosclerosis. No pneumothorax. Postsurgical changes of the right clavicle. Spine appears osteopenic with stable mild midthoracic wedging. IMPRESSION: 1. Moderate to large right pleural effusion with atelectasis or pneumonia at the right middle lobe and right base. 2. Cardiomegaly with vascular congestion. Electronically Signed   By: Donavan Foil M.D.   On: 05/24/2017 18:35   US Thoracentesis Asp Pleural Space W/img Guide  Result Date: 05/25/2017 CLINICAL DATA:  Right pleural effusion. EXAM: ULTRASOUND GUIDED RIGHT THORACENTESIS COMPARISON:  Chest x-ray on 05/24/2017 PROCEDURE: An ultrasound guided thoracentesis was thoroughly discussed with the patient's brother and questions answered. The benefits, risks, alternatives and complications were also discussed. The patient's brother understands and wishes to  proceed with the procedure. Written consent  was obtained. Ultrasound was performed to localize and mark an adequate pocket of fluid in the right chest. The area was then prepped and draped in the normal sterile fashion. 1% Lidocaine was used for local anesthesia. Under ultrasound guidance a 6 French Safe-T-Centesis catheter was introduced. Thoracentesis was performed. The catheter was removed and a dressing applied. COMPLICATIONS: None FINDINGS: A total of approximately 1.4 L of dark, yellow fluid was removed. A fluid sample was sent for laboratory analysis. IMPRESSION: Successful ultrasound guided right thoracentesis yielding 1.4 L of pleural fluid. Electronically Signed   By: Aletta Edouard M.D.   On: 05/25/2017 14:52       Today   Subjective:   Sharon [name] area mostly resolved doing much better Objective:   Blood pressure 119/81, pulse 63, temperature 98 F (36.7 C), temperature source Oral, resp. rate 18, height 5\' 4"  (1.626 m), weight 47.4 kg (104 lb 8 oz), SpO2 96 %.  .  Intake/Output Summary (Last 24 hours) at 06/01/2017 1032 Last data filed at 05/31/2017 2200 Gross per 24 hour  Intake 60 ml  Output 500 ml  Net -440 ml    Exam VITAL SIGNS: Blood pressure 119/81, pulse 63, temperature 98 F (36.7 C), temperature source Oral, resp. rate 18, height 5\' 4"  (1.626 m), weight 47.4 kg (104 lb 8 oz), SpO2 96 %.  GENERAL:  84 y.o.-year-old patient lying in the bed with no acute distress.  EYES: Pupils equal, round, reactive to light and accommodation. No scleral icterus. Extraocular muscles intact.  HEENT: Head atraumatic, normocephalic. Oropharynx and nasopharynx clear.  NECK:  Supple, no jugular venous distention. No thyroid enlargement, no tenderness.  LUNGS: Normal breath sounds bilaterally, no wheezing, rales,rhonchi or crepitation. No use of accessory muscles of respiration.  CARDIOVASCULAR: S1, S2 normal. No murmurs, rubs, or gallops.  ABDOMEN: Soft, nontender, nondistended. Bowel  sounds present. No organomegaly or mass.  EXTREMITIES: No pedal edema, cyanosis, or clubbing.  NEUROLOGIC: Cranial nerves II through XII are intact. Muscle strength 5/5 in all extremities. Sensation intact. Gait not checked.  PSYCHIATRIC: The patient is alert and oriented x 3.  SKIN: No obvious rash, lesion, or ulcer.   Data Review     CBC w Diff:  Lab Results  Component Value Date   WBC 8.0 05/30/2017   HGB 11.3 (L) 05/30/2017   HGB 12.0 04/24/2013   HCT 33.9 (L) 05/30/2017   HCT 35.6 04/24/2013   PLT 219 05/30/2017   PLT 216 04/24/2013   LYMPHOPCT 15 05/24/2017   LYMPHOPCT 25.3 04/24/2013   MONOPCT 9 05/24/2017   MONOPCT 8.9 04/24/2013   EOSPCT 1 05/24/2017   EOSPCT 3.1 04/24/2013   BASOPCT 1 05/24/2017   BASOPCT 0.5 04/24/2013   CMP:  Lab Results  Component Value Date   NA 133 (L) 05/31/2017   NA 142 04/24/2013   K 4.8 05/31/2017   K 3.8 04/24/2013   CL 95 (L) 05/31/2017   CL 106 04/24/2013   CO2 24 05/31/2017   CO2 32 04/24/2013   BUN 78 (H) 05/31/2017   BUN 40 (A) 09/03/2014   BUN 30 (H) 04/24/2013   CREATININE 4.01 (H) 05/31/2017   CREATININE 2.68 (H) 04/24/2013   PROT 7.3 05/24/2017   ALBUMIN 3.2 (L) 05/31/2017   BILITOT 1.7 (H) 05/24/2017   ALKPHOS 132 (H) 05/24/2017   AST 39 05/24/2017   ALT 20 05/24/2017  .  Micro Results Recent Results (from the past 240 hour(s))  Blood culture (routine x 2)  Status: None   Collection Time: 05/24/17  7:08 PM  Result Value Ref Range Status   Specimen Description BLOOD L FA  Final   Special Requests   Final    BOTTLES DRAWN AEROBIC AND ANAEROBIC Blood Culture adequate volume   Culture   Final    NO GROWTH 5 DAYS Performed at [facility], [address] [facility] [facility], [place] [address]    Report Status 05/29/2017 FINAL  Final  Blood culture (routine x 2)     Status: None   Collection Time: 05/24/17  7:11 PM  Result Value Ref Range Status   Specimen Description BLOOD BLOOD LEFT WRIST  Final    Special Requests   Final    BOTTLES DRAWN AEROBIC AND ANAEROBIC Blood Culture adequate volume   Culture   Final    NO GROWTH 5 DAYS Performed at [facility], [address] [place], [place] [address]    Report Status 05/29/2017 FINAL  Final  MRSA PCR Screening     Status: Abnormal   Collection Time: 05/25/17  5:02 AM  Result Value Ref Range Status   MRSA by PCR POSITIVE (A) NEGATIVE Final    Comment:        The GeneXpert MRSA Assay (FDA approved for NASAL specimens only), is one component of a comprehensive MRSA colonization surveillance program. It is not intended to diagnose MRSA infection nor to guide or monitor treatment for MRSA infections. RESULT CALLED TO, READ BACK BY AND VERIFIED WITH: MARCELLA TURNER AT [place] ON 05/25/2017 JJB Performed at [facility], [address] [place], [place] [address]   Body fluid culture     Status: None   Collection Time: 05/25/17  3:15 PM  Result Value Ref Range Status   Specimen Description   Final    PLEURAL Performed at [facility], [address], [place], [place] [address]    Special Requests   Final    NONE Performed at [facility], [place], [place] [address]    Gram Stain NO WBC SEEN NO ORGANISMS SEEN   Final   Culture   Final    NO GROWTH 3 DAYS Performed at [facility], [place] [address] [place], [place] [address]    Report Status 05/29/2017 FINAL  Final  Gastrointestinal Panel by PCR , Stool     Status: None   Collection Time: 05/26/17  5:05 PM  Result Value Ref Range Status   Campylobacter species NOT DETECTED NOT DETECTED Final   Plesimonas shigelloides NOT DETECTED NOT DETECTED Final   Salmonella species NOT DETECTED NOT DETECTED Final   Yersinia enterocolitica NOT DETECTED NOT DETECTED Final   Vibrio species NOT DETECTED NOT DETECTED Final   Vibrio cholerae NOT DETECTED NOT DETECTED Final   Enteroaggregative E coli (EAEC) NOT DETECTED NOT DETECTED  Final   Enteropathogenic E coli (EPEC) NOT DETECTED NOT DETECTED Final   Enterotoxigenic E coli (ETEC) NOT DETECTED NOT DETECTED Final   Shiga like toxin producing E coli (STEC) NOT DETECTED NOT DETECTED Final   Shigella/Enteroinvasive E coli (EIEC) NOT DETECTED NOT DETECTED Final   Cryptosporidium NOT DETECTED NOT DETECTED Final   Cyclospora cayetanensis NOT DETECTED NOT DETECTED Final   Entamoeba histolytica NOT DETECTED NOT DETECTED Final   Giardia lamblia NOT DETECTED NOT DETECTED Final   Adenovirus F40/41 NOT DETECTED NOT DETECTED Final   Astrovirus NOT DETECTED NOT DETECTED Final   Norovirus GI/GII NOT DETECTED NOT DETECTED Final  Rotavirus A NOT DETECTED NOT DETECTED Final   Sapovirus (I, II, IV, and V) NOT DETECTED NOT DETECTED Final    Comment: Performed at [facility], [address] [place], [place] [address]  C difficile quick scan w PCR reflex     Status: Abnormal   Collection Time: 05/26/17  5:05 PM  Result Value Ref Range Status   C Diff antigen POSITIVE (A) NEGATIVE Final   C Diff toxin NEGATIVE NEGATIVE Final   C Diff interpretation Results are indeterminate. See PCR results.  Final    Comment: Performed at [facility], [address] [place], [place] [address]  C. Diff by PCR, Reflexed     Status: Abnormal   Collection Time: 05/26/17  5:05 PM  Result Value Ref Range Status   Toxigenic C. Difficile by PCR POSITIVE (A) NEGATIVE Final    Comment: Positive for toxigenic C. difficile with little to no toxin production. Only treat if clinical presentation suggests symptomatic illness. Performed at [facility], [address] [place], [place] [address]         Code Status Orders  (From admission, onward)        Start     Ordered   05/24/17 2259  Do not attempt resuscitation (DNR)  Continuous    Question Answer Comment  In the event of cardiac or respiratory ARREST Do not call a "code blue"   In the event of cardiac or  respiratory ARREST Do not perform Intubation, CPR, defibrillation or ACLS   In the event of cardiac or respiratory ARREST Use medication by any route, position, wound care, and other measures to relive pain and suffering. May use oxygen, suction and manual treatment of airway obstruction as needed for comfort.      05/24/17 2258    Code Status History    Date Active Date Inactive Code Status Order ID Comments User Context   02/16/2017 1928 02/17/2017 1949 Full Code [id number]  Corky Mull, MD Inpatient   02/03/2017 1230 02/04/2017 1907 DNR [id number]  Nicholes Mango, MD ED   01/01/2017 2027 01/08/2017 1858 Full Code [id number]  [name], MD ED   05/12/2016 0809 05/14/2016 1439 DNR [id number]  Max Sane, MD Inpatient   05/10/2016 0506 05/12/2016 0809 Full Code [id number]  Saundra Shelling, MD Inpatient   12/15/2015 1515 12/19/2015 0159 Full Code [id number]  Oletta Cohn, DO Inpatient   12/14/2015 1613 12/15/2015 1515 Full Code [id number]  Bettey Costa, MD Inpatient    Advance Directive Documentation     Most Recent Value  Type of Advance Directive  Healthcare Power of Attorney  Pre-existing out of facility DNR order (yellow form or pink MOST form)  -  "MOST" Form in Place?  -          Follow-up Information    Kirk Ruths, MD Follow up in 6 day(s).   Specialty:  Internal Medicine Contact information: [place] [address] [phone]           Discharge Medications   Allergies as of 06/01/2017      Reactions   Statins Other (See Comments)   Muscle weakness severe   Codeine Sulfate Nausea And Vomiting   Codeine Other (See Comments)   GI UPSET   Effexor [venlafaxine] Nausea Only   Ezetimibe-simvastatin Other (See Comments)   Muscle weakness      Medication List    STOP taking these medications   diphenoxylate-atropine  2.5-0.025 MG tablet Commonly known as:  LOMOTIL   metoprolol tartrate 25 MG tablet Commonly known  as:  LOPRESSOR   polyethylene glycol packet Commonly known as:  MIRALAX / GLYCOLAX   ramipril 2.5 MG capsule Commonly known as:  ALTACE     TAKE these medications   acetaminophen 325 MG tablet Commonly known as:  TYLENOL Take 2 tablets (650 mg total) by mouth every 4 (four) hours as needed for mild pain or moderate pain.   benzonatate 200 MG capsule Commonly known as:  TESSALON Take 1 capsule (200 mg total) by mouth 3 (three) times daily.   feeding supplement (NEPRO CARB STEADY) Liqd Take 237 mLs by mouth 2 (two) times daily between meals.   folic acid-vitamin b complex-vitamin c-selenium-zinc 3 MG Tabs tablet Take 1 tablet by mouth daily.   guaiFENesin-dextromethorphan 100-10 MG/5ML syrup Commonly known as:  ROBITUSSIN DM Take 5 mLs by mouth every 4 (four) hours as needed for cough.   levothyroxine 25 MCG tablet Commonly known as:  SYNTHROID, LEVOTHROID Take 1 tablet (25 mcg total) by mouth daily.   lidocaine 5 % Commonly known as:  LIDODERM Place 1 patch onto the skin daily. Remove & Discard patch within 12 hours or as directed by MD   metoCLOPramide 5 MG tablet Commonly known as:  REGLAN Take 1 tablet (5 mg total) by mouth every 8 (eight) hours as needed (vertigo).   metoprolol succinate 25 MG 24 hr tablet Commonly known as:  TOPROL-XL Take 1 tablet (25 mg total) by mouth daily.   sertraline 100 MG tablet Commonly known as:  ZOLOFT Take 150 mg by mouth at bedtime.   sevelamer carbonate 800 MG tablet Commonly known as:  RENVELA Take 1 tablet (800 mg total) by mouth 3 (three) times daily with meals.   traMADol 50 MG tablet Commonly known as:  ULTRAM Take 1-2 tablets (50-100 mg total) by mouth every 6 (six) hours as needed for moderate pain.   vancomycin 50 mg/mL oral solution Commonly known as:  VANCOCIN Take 2.5 mLs (125 mg total) by mouth every 6 (six) hours for 7 days.   Vitamin D (Ergocalciferol) 50000 units Caps capsule Commonly known as:   DRISDOL Take 50,000 Units by mouth every Monday.          Total Time in preparing paper work, data evaluation and todays exam - 52 minutes  Dustin Flock M.D on 06/01/2017 at 10:32 AM [place]  [phone]

## 2017-06-01 NOTE — [place] Notes (Signed)
06/01/2017 11:48 AM  Called receiving [facility] where pt will be discharged and gave report to the receiving nurse.  Dola Argyle, RN

## 2017-06-01 NOTE — [place] Notes (Signed)
Physical Therapy Treatment Patient Details [name] MRN: [id number] DOB: 08/11/1926 Today's Date: 06/01/2017    History of Present Illness Pt is an 84 y.o. female presenting to hospital 05/24/17 with AMS from dialysis (increased weakness, lethargy, and cough).  Pt admitted with AMS, HCAP, moderate to large R pleural effusion (s/p thoracentesis 05/25/17), severe chronic systolic CHF, and found to be (+) c-diff.  PMH includes s/p ORIF R clavicle fx 02/16/17, CHF, ESRD on HD, IBS, Meniere's disease, MI, L TKR, pacemaker.    PT Comments    Pt sitting EOB eating breakfast, requesting to use bathroom.  Bed mobility with supervision and was able to ambulate to bathroom with walker and min guard.  She then continued to walk in room an additional 68' with walker and slow, steady gait.  Pt on room air during gait and sats remained 96% + during session.  Pt in recliner after session.  Discussed with primary nurse and OK to leave O2 off at this time.    Follow Up Recommendations  SNF     Equipment Recommendations  Rolling walker with 5" wheels    Recommendations for Other Services       Precautions / Restrictions Precautions Precautions: Fall Precaution Comments: R forearm AV fistula; L IJ HD catheter; pacemaker Restrictions Weight Bearing Restrictions: No    Mobility  Bed Mobility Overal bed mobility: Needs Assistance Bed Mobility: Supine to Sit     Supine to sit: Supervision        Transfers Overall transfer level: Needs assistance Equipment used: Rolling walker (2 wheeled) Transfers: Sit to/from Stand Sit to Stand: Min guard            Ambulation/Gait Ambulation/Gait assistance: Min guard Ambulation Distance (Feet): 80 Feet Assistive device: Rolling walker (2 wheeled) Gait Pattern/deviations: Step-through pattern;Decreased step length - right;Decreased step length - left Gait velocity: decreased Gait velocity interpretation: <1.8 ft/sec, indicate of risk for  recurrent falls     Stairs             Wheelchair Mobility    Modified Rankin (Stroke Patients Only)       Balance Overall balance assessment: Needs assistance Sitting-balance support: No upper extremity supported;Feet supported Sitting balance-Leahy Scale: Good     Standing balance support: Bilateral upper extremity supported Standing balance-Leahy Scale: Fair Standing balance comment: requires at least single UE support for standing balance                            Cognition Arousal/Alertness: Awake/alert Behavior During Therapy: WFL for tasks assessed/performed Overall Cognitive Status: Within Functional Limits for tasks assessed                                        Exercises Other Exercises Other Exercises: to bathroom for loose medium BM - nurse tech notified    General Comments        Pertinent Vitals/Pain Pain Assessment: No/denies pain    Home Living                      Prior Function            PT Goals (current goals can now be found in the care plan section) Progress towards PT goals: Progressing toward goals    Frequency    Min 2X/week  PT Plan Current plan remains appropriate    Co-evaluation              AM-PAC PT "6 Clicks" Daily Activity  Outcome Measure  Difficulty turning over in bed (including adjusting bedclothes, sheets and blankets)?: None Difficulty moving from lying on back to sitting on the side of the bed? : None Difficulty sitting down on and standing up from a chair with arms (e.g., wheelchair, bedside commode, etc,.)?: Unable Help needed moving to and from a bed to chair (including a wheelchair)?: A Little Help needed walking in hospital room?: A Little Help needed climbing 3-5 steps with a railing? : A Little 6 Click Score: 18    End of Session Equipment Utilized During Treatment: Gait belt Activity Tolerance: Patient tolerated treatment well Patient  left: in chair;with chair alarm set;with call bell/phone within reach Nurse Communication: Other (comment)       Time: 3825-0539 PT Time Calculation (min) (ACUTE ONLY): 31 min  Charges:  $Gait Training: 8-22 mins $Therapeutic Activity: 8-22 mins                    G Codes:       Chesley Noon, PTA 06/01/17, 10:16 AM

## 2017-06-07 ENCOUNTER — Encounter (INDEPENDENT_AMBULATORY_CARE_PROVIDER_SITE_OTHER): Payer: Self-pay

## 2017-06-08 ENCOUNTER — Ambulatory Visit (INDEPENDENT_AMBULATORY_CARE_PROVIDER_SITE_OTHER): Payer: No Typology Code available for payment source

## 2017-06-08 ENCOUNTER — Encounter (INDEPENDENT_AMBULATORY_CARE_PROVIDER_SITE_OTHER): Payer: Self-pay | Admitting: Vascular Surgery

## 2017-06-08 ENCOUNTER — Ambulatory Visit (INDEPENDENT_AMBULATORY_CARE_PROVIDER_SITE_OTHER): Payer: No Typology Code available for payment source | Admitting: Vascular Surgery

## 2017-06-08 VITALS — BP 141/73 | HR 72 | Resp 16 | Ht 64.0 in | Wt 104.0 lb

## 2017-06-08 DIAGNOSIS — M7989 Other specified soft tissue disorders: Secondary | ICD-10-CM | POA: Insufficient documentation

## 2017-06-08 DIAGNOSIS — Z992 Dependence on renal dialysis: Secondary | ICD-10-CM

## 2017-06-08 DIAGNOSIS — I10 Essential (primary) hypertension: Secondary | ICD-10-CM | POA: Diagnosis not present

## 2017-06-08 DIAGNOSIS — M79605 Pain in left leg: Secondary | ICD-10-CM | POA: Diagnosis not present

## 2017-06-08 DIAGNOSIS — I5022 Chronic systolic (congestive) heart failure: Secondary | ICD-10-CM

## 2017-06-08 DIAGNOSIS — N186 End stage renal disease: Secondary | ICD-10-CM | POA: Diagnosis not present

## 2017-06-08 DIAGNOSIS — M79604 Pain in right leg: Secondary | ICD-10-CM

## 2017-06-08 NOTE — Assessment & Plan Note (Signed)
Venous duplex today unrevealing.  Likely from heart and renal disease as well as immobility.

## 2017-06-08 NOTE — Assessment & Plan Note (Signed)
blood pressure control important in reducing the progression of atherosclerotic disease. On appropriate oral medications.  

## 2017-06-08 NOTE — Progress Notes (Signed)
MRN : [id number]  Sharon [name] is a 84 y.o. (05/09/1926) female who presents with chief complaint of  Chief Complaint  Patient presents with  . Follow-up    pt conv bil ven reflux  .  History of Present Illness: Patient returns today in follow up of leg swelling  Venous reflux study today was negative for DVT, SVT, or reflux today States her AVF has had low flow and infiltrations recurrently.  Current Outpatient Medications  Medication Sig Dispense Refill  . acetaminophen (TYLENOL) 325 MG tablet Take 2 tablets (650 mg total) by mouth every 4 (four) hours as needed for mild pain or moderate pain.    . benzonatate (TESSALON) 200 MG capsule Take 1 capsule (200 mg total) by mouth 3 (three) times daily. 20 capsule 0  . guaiFENesin-dextromethorphan (ROBITUSSIN DM) 100-10 MG/5ML syrup Take 5 mLs by mouth every 4 (four) hours as needed for cough. 118 mL 0  . hydrocortisone 2.5 % cream Apply topically 3 (three) times daily.    Marland Kitchen levothyroxine (SYNTHROID, LEVOTHROID) 25 MCG tablet Take 1 tablet (25 mcg total) by mouth daily.    . metoprolol succinate (TOPROL-XL) 25 MG 24 hr tablet Take 1 tablet (25 mg total) by mouth daily.    . multivitamin (RENA-VIT) TABS tablet Take 1 tablet by mouth daily.    . sertraline (ZOLOFT) 100 MG tablet Take 150 mg by mouth at bedtime.     . sevelamer carbonate (RENVELA) 800 MG tablet Take 1 tablet (800 mg total) by mouth 3 (three) times daily with meals. 90 tablet 0  . traMADol (ULTRAM) 50 MG tablet Take 1-2 tablets (50-100 mg total) by mouth every 6 (six) hours as needed for moderate pain. 30 tablet 0  . vancomycin (VANCOCIN) 50 mg/mL oral solution Take 2.5 mLs (125 mg total) by mouth every 6 (six) hours for 7 days. 70 mL 0  . Vitamin D, Ergocalciferol, (DRISDOL) 50000 units CAPS capsule Take 50,000 Units by mouth every Monday.     . folic acid-vitamin b complex-vitamin c-selenium-zinc (DIALYVITE) 3 MG TABS tablet Take 1 tablet by mouth daily.    Marland Kitchen lidocaine  (LIDODERM) 5 % Place 1 patch onto the skin daily. Remove & Discard patch within 12 hours or as directed by MD (Patient not taking: Reported on 03/26/2017) 15 patch 0  . metoCLOPramide (REGLAN) 5 MG tablet Take 1 tablet (5 mg total) by mouth every 8 (eight) hours as needed (vertigo). (Patient not taking: Reported on 06/08/2017) 15 tablet 0  . Nutritional Supplements (FEEDING SUPPLEMENT, NEPRO CARB STEADY,) LIQD Take 237 mLs by mouth 2 (two) times daily between meals. (Patient not taking: Reported on 03/26/2017) 60 Can 0   No current facility-administered medications for this visit.     Past Medical History:  Diagnosis Date  . Anemia   . Arthritis   . CAD (coronary artery disease)   . Cancer ([place])    skin  . Cardiomyopathy ([place])   . CHF (congestive heart failure) ([place])   . Depression   . IBS (irritable bowel syndrome) 2010  . Kidney failure July 2012   Hemodialysis 3xweek  . Kidney failure   . Meniere disease   . Meniere's disease   . Myocardial infarction ([place])   . OSA (obstructive sleep apnea)    CPAP  . Peritoneal dialysis status ([place])   . Presence of IVC filter 2019  . Presence of permanent cardiac pacemaker   . Renal insufficiency     Past Surgical  History:  Procedure Laterality Date  . [place]  . ABDOMINAL HYSTERECTOMY    . ANUS SURGERY  2010  . AV FISTULA PLACEMENT Right 04/15/2016   Procedure: ARTERIOVENOUS (AV) FISTULA CREATION ( RADIOCEPHALIC ) STAGE 2;  Surgeon: Algernon Huxley, MD;  Location: [facility];  Service: Vascular;  Laterality: Right;  . CATARACT EXTRACTION  2006, 2011  . CHOLECYSTECTOMY  01/2008  . DIALYSIS/PERMA CATHETER INSERTION N/A 01/07/2017   Procedure: DIALYSIS/PERMA CATHETER INSERTION With an IVC filter placement;  Surgeon: Algernon Huxley, MD;  Location: [place] [facility];  Service: Cardiovascular;  Laterality: N/A;  . DIALYSIS/PERMA CATHETER REMOVAL N/A 08/20/2016   Procedure: Dialysis/Perma Catheter Removal;  Surgeon: Algernon Huxley,  MD;  Location: [place] [facility];  Service: Cardiovascular;  Laterality: N/A;  . EYE SURGERY     cataracts bilateral  . FEMUR IM NAIL Left 12/15/2015   Procedure: INTRAMEDULLARY (IM) RETROGRADE FEMORAL NAILING;  Surgeon: Oletta Cohn, DO;  Location: [facility];  Service: Orthopedics;  Laterality: Left;  . HIP PINNING,CANNULATED Left 05/10/2016   Procedure: CANNULATED HIP PINNING;  Surgeon: Earnestine Leys, MD;  Location: [facility];  Service: Orthopedics;  Laterality: Left;  . INSERT / REPLACE / REMOVE PACEMAKER    . INSERTION OF DIALYSIS CATHETER  07/2010  . IVC FILTER INSERTION  2019  . JOINT REPLACEMENT     left knee  . MINOR REMOVAL OF PERITONEAL DIALYSIS CATHETER  04/15/2016   Procedure: MINOR REMOVAL OF PERITONEAL DIALYSIS CATHETER;  Surgeon: Algernon Huxley, MD;  Location: [facility];  Service: Vascular;;  . ORIF CLAVICULAR FRACTURE Right 02/16/2017   Procedure: OPEN REDUCTION INTERNAL FIXATION (ORIF) CLAVICULAR FRACTURE;  Surgeon: Corky Mull, MD;  Location: [facility];  Service: Orthopedics;  Laterality: Right;  . PACEMAKER INSERTION  2006  . PERIPHERAL VASCULAR CATHETERIZATION N/A 12/18/2015   Procedure: Dialysis/Perma Catheter Insertion;  Surgeon: Katha Cabal, MD;  Location: [place] [facility];  Service: Cardiovascular;  Laterality: N/A;  . TOTAL KNEE ARTHROPLASTY  2008   LEFT/Dr [place]     Social History Social History        Tobacco Use  . Smoking status: Former Smoker    Last attempt to quit: 03/31/1948    Years since quitting: 68.7  . Smokeless tobacco: Never Used  Substance Use Topics  . Alcohol use: No  . Drug use: No     Family History      Family History  Problem Relation Age of Onset  . Cancer Mother        ? ovarian - sarcoma  . Cancer Father        Skin cancer  . Diabetes Sister   . COPD Sister   . Depression Sister   . Cancer Sister        Lung - 28 yrs old          Allergies  Allergen Reactions  . Statins Other (See  Comments)    Muscle weakness Muscle weakness severe  . Codeine Sulfate Nausea And Vomiting  . Codeine Other (See Comments)    GI UPSET  . Effexor [Venlafaxine] Nausea Only  . Ezetimibe-Simvastatin Other (See Comments)    Muscle weakness     REVIEW OF SYSTEMS (Negative unless checked)  Constitutional: _0 Weight loss  _1 Fever  _2 Chills Cardiac: _3 Chest pain   _4 Chest pressure   _5 Palpitations   _6 Shortness of breath when laying flat   _7 Shortness of breath at rest   _8 Shortness of  breath with exertion. Vascular:  _0 Pain in legs with walking   _1 Pain in legs at rest   _2 Pain in legs when laying flat   _3 Claudication   _4 Pain in feet when walking  _5 Pain in feet at rest  _6 Pain in feet when laying flat   _7 History of DVT   _8 Phlebitis   _9 Swelling in legs   _10 Varicose veins   _11 Non-healing ulcers Pulmonary:   _12 Uses home oxygen   _13 Productive cough   _14 Hemoptysis   _15 Wheeze  _16 COPD   _17 Asthma Neurologic:  _18 Dizziness  _19 Blackouts   _20 Seizures   _21 History of stroke   _22 History of TIA  _23 Aphasia   _24 Temporary blindness   _25 Dysphagia   _26 Weakness or numbness in arms   _27 Weakness or numbness in legs Musculoskeletal:  _28 Arthritis   _29 Joint swelling   _30 Joint pain   _31 Low back pain Hematologic:  _32 Easy bruising  _33 Easy bleeding   _34 Hypercoagulable state   _35 Anemic   Gastrointestinal:  _36 Blood in stool   _37 Vomiting blood  _38 Gastroesophageal reflux/heartburn   _39 Abdominal pain Genitourinary:  _40 Chronic kidney disease   _41 Difficult urination  _42 Frequent urination  _43 Burning with urination   _44 Hematuria Skin:  _45 Rashes   _46 Ulcers   _47 Wounds Psychological:  _48 History of anxiety   _49  History of major depression.    Physical Examination  BP (!) 141/73 (BP Location: Left Arm)   Pulse 72   Resp 16   Ht _50  (1.626 m)   Wt 104 lb (47.2 kg)   BMI 17.85 kg/m  Gen:  WD/WN, NAD Head: [place]/AT, No temporalis wasting. Ear/Nose/Throat: Hearing grossly intact, nares w/o erythema or  drainage Eyes: Conjunctiva clear. Sclera non-icteric Neck: Supple.  Trachea midline Pulmonary:  Good air movement, no use of accessory muscles.  Cardiac: irregular Vascular: good size right radiocephalic AVF with moderate bruising.  Good thrill. Vessel Right Left  Radial Palpable Palpable                          PT 1+ Palpable 1+ Palpable  DP 1+ Palpable 1+ Palpable    Musculoskeletal: M/S 5/5 throughout.  No deformity or atrophy. 1+ BLE edema. Neurologic: Sensation grossly intact in extremities.  Symmetrical.  Speech is fluent.  Psychiatric: Judgment intact, Mood & affect appropriate for pt's clinical situation. Dermatologic: No rashes or ulcers noted.  No cellulitis or open wounds.       Labs Recent Results (from the past 2160 hour(s))  CBC     Status: Abnormal   Collection Time: 04/07/17  3:42 PM  Result Value Ref Range   WBC 6.0 3.6 - 11.0 K/uL   RBC 3.80 3.80 - 5.20 MIL/uL   Hemoglobin 11.9 (L) 12.0 - 16.0 g/dL   HCT 36.8 35.0 - 47.0 %   MCV 96.9 80.0 - 100.0 fL   MCH 31.3 26.0 - 34.0 pg   MCHC 32.3 32.0 - 36.0 g/dL   RDW 15.2 (H) 11.5 - 14.5 %   Platelets 247 150 - 440 K/uL    Comment: Performed at [facility], [address] [place], [place] [address]  Basic metabolic panel     Status: Abnormal   Collection Time: 04/07/17  3:42 PM  Result Value Ref Range   Sodium 138 135 - 145 mmol/L   Potassium 3.9 3.5 - 5.1 mmol/L   Chloride 101 101 - 111 mmol/L   CO2 25 22 - 32 mmol/L   Glucose, Bld 92 65 - 99 mg/dL   BUN  46 (H) 6 - 20 mg/dL   Creatinine, Ser 3.60 (H) 0.44 - 1.00 mg/dL   Calcium 8.9 8.9 - 10.3 mg/dL   GFR calc non Af Amer 10 (L) >60 mL/min   GFR calc Af Amer 12 (L) >60 mL/min    Comment: (NOTE) The eGFR has been calculated using the CKD EPI equation. This calculation has not been validated in all clinical situations. eGFR's persistently <60 mL/min signify possible Chronic Kidney Disease.    Anion gap 12 5 - 15    Comment: Performed  at [facility], [address] [place], [place] [address]  Basic metabolic panel     Status: Abnormal   Collection Time: 04/21/17 10:37 AM  Result Value Ref Range   Sodium 136 135 - 145 mmol/L   Potassium 4.9 3.5 - 5.1 mmol/L    Comment: HEMOLYSIS AT THIS LEVEL MAY AFFECT RESULT   Chloride 99 (L) 101 - 111 mmol/L   CO2 24 22 - 32 mmol/L   Glucose, Bld 92 65 - 99 mg/dL   BUN 55 (H) 6 - 20 mg/dL   Creatinine, Ser 4.00 (H) 0.44 - 1.00 mg/dL   Calcium 8.8 (L) 8.9 - 10.3 mg/dL   GFR calc non Af Amer 9 (L) >60 mL/min   GFR calc Af Amer 11 (L) >60 mL/min    Comment: (NOTE) The eGFR has been calculated using the CKD EPI equation. This calculation has not been validated in all clinical situations. eGFR's persistently <60 mL/min signify possible Chronic Kidney Disease.    Anion gap 13 5 - 15    Comment: Performed at [facility], [address] [place], [place] [address]  CBC with Differential     Status: Abnormal   Collection Time: 04/21/17 10:37 AM  Result Value Ref Range   WBC 6.9 3.6 - 11.0 K/uL   RBC 4.18 3.80 - 5.20 MIL/uL   Hemoglobin 12.8 12.0 - 16.0 g/dL   HCT 39.0 35.0 - 47.0 %   MCV 93.3 80.0 - 100.0 fL   MCH 30.6 26.0 - 34.0 pg   MCHC 32.8 32.0 - 36.0 g/dL   RDW 14.9 (H) 11.5 - 14.5 %   Platelets 264 150 - 440 K/uL   Neutrophils Relative % 74 %   Neutro Abs 5.2 1.4 - 6.5 K/uL   Lymphocytes Relative 13 %   Lymphs Abs 0.9 (L) 1.0 - 3.6 K/uL   Monocytes Relative 8 %   Monocytes Absolute 0.5 0.2 - 0.9 K/uL   Eosinophils Relative 4 %   Eosinophils Absolute 0.3 0 - 0.7 K/uL   Basophils Relative 1 %   Basophils Absolute 0.1 0 - 0.1 K/uL    Comment: Performed at [facility], [address] [place], [place] [address]  Troponin I     Status: Abnormal   Collection Time: 04/21/17 10:37 AM  Result Value Ref Range   Troponin I 0.08 (HH) <0.03 ng/mL    Comment: CRITICAL RESULT CALLED TO, READ BACK BY AND VERIFIED WITH  ANGELA ROBBINS AT 1138  04/21/17 SDR Performed at [facility], [address] [place], [place] [address]   CK     Status: None   Collection Time: 04/21/17 10:37 AM  Result Value Ref Range   Total CK 71 38 - 234 U/L    Comment: HEMOLYSIS AT THIS LEVEL MAY AFFECT RESULT Performed at [facility], [address], [place], [place] [address]   Troponin I  Status: Abnormal   Collection Time: 04/21/17  1:48 PM  Result Value Ref Range   Troponin I 0.09 (HH) <0.03 ng/mL    Comment: CRITICAL VALUE NOTED. VALUE IS CONSISTENT WITH PREVIOUSLY REPORTED/CALLED VALUE  SDR Performed at [facility], [address] [place], [place] [address]   CBC with Differential     Status: Abnormal   Collection Time: 05/24/17  7:08 PM  Result Value Ref Range   WBC 6.7 3.6 - 11.0 K/uL   RBC 4.34 3.80 - 5.20 MIL/uL   Hemoglobin 13.0 12.0 - 16.0 g/dL   HCT 39.1 35.0 - 47.0 %   MCV 90.1 80.0 - 100.0 fL   MCH 30.0 26.0 - 34.0 pg   MCHC 33.2 32.0 - 36.0 g/dL   RDW 15.6 (H) 11.5 - 14.5 %   Platelets 203 150 - 440 K/uL   Neutrophils Relative % 74 %   Neutro Abs 5.0 1.4 - 6.5 K/uL   Lymphocytes Relative 15 %   Lymphs Abs 1.0 1.0 - 3.6 K/uL   Monocytes Relative 9 %   Monocytes Absolute 0.6 0.2 - 0.9 K/uL   Eosinophils Relative 1 %   Eosinophils Absolute 0.1 0 - 0.7 K/uL   Basophils Relative 1 %   Basophils Absolute 0.0 0 - 0.1 K/uL    Comment: Performed at [facility], [address] [place], [place] [address]  Comprehensive metabolic panel     Status: Abnormal   Collection Time: 05/24/17  7:08 PM  Result Value Ref Range   Sodium 134 (L) 135 - 145 mmol/L   Potassium 3.8 3.5 - 5.1 mmol/L   Chloride 94 (L) 101 - 111 mmol/L   CO2 27 22 - 32 mmol/L   Glucose, Bld 74 65 - 99 mg/dL   BUN 19 6 - 20 mg/dL   Creatinine, Ser 2.44 (H) 0.44 - 1.00 mg/dL   Calcium 8.9 8.9 - 10.3 mg/dL   Total Protein 7.3 6.5 - 8.1 g/dL   Albumin 3.5 3.5 - 5.0 g/dL   AST 39 15 - 41 U/L   ALT 20 14 - 54 U/L    Alkaline Phosphatase 132 (H) 38 - 126 U/L   Total Bilirubin 1.7 (H) 0.3 - 1.2 mg/dL   GFR calc non Af Amer 17 (L) >60 mL/min   GFR calc Af Amer 19 (L) >60 mL/min    Comment: (NOTE) The eGFR has been calculated using the CKD EPI equation. This calculation has not been validated in all clinical situations. eGFR's persistently <60 mL/min signify possible Chronic Kidney Disease.    Anion gap 13 5 - 15    Comment: Performed at [facility], [address] [place], [place] [address]  Troponin I     Status: Abnormal   Collection Time: 05/24/17  7:08 PM  Result Value Ref Range   Troponin I 0.12 (HH) <0.03 ng/mL    Comment: CRITICAL RESULT CALLED TO, READ BACK BY AND VERIFIED WITH ELLEN FLUECKIGER ON 05/24/17 AT 1956 [facility] Performed at [facility], [address] [place], [place] [address]   Blood culture (routine x 2)     Status: None   Collection Time: 05/24/17  7:08 PM  Result Value Ref Range   Specimen Description BLOOD L FA    Special Requests      BOTTLES DRAWN AEROBIC AND ANAEROBIC Blood Culture adequate volume   Culture      NO GROWTH 5 DAYS Performed at [facility], [place]  [address], [place], [place] [address]    Report Status 05/29/2017 FINAL   Lactic acid, plasma     Status: None   Collection Time: 05/24/17  7:09 PM  Result Value Ref Range   Lactic Acid, Venous 1.5 0.5 - 1.9 mmol/L    Comment: Performed at [facility], [address] [place], [place] [address]  Blood culture (routine x 2)     Status: None   Collection Time: 05/24/17  7:11 PM  Result Value Ref Range   Specimen Description BLOOD BLOOD LEFT WRIST    Special Requests      BOTTLES DRAWN AEROBIC AND ANAEROBIC Blood Culture adequate volume   Culture      NO GROWTH 5 DAYS Performed at [facility], [address] [place], [place] [address]    Report Status 05/29/2017 FINAL   Lactic acid, plasma     Status: None   Collection Time: 05/24/17 11:22 PM  Result  Value Ref Range   Lactic Acid, Venous 1.4 0.5 - 1.9 mmol/L    Comment: Performed at [facility], [address], [place], [place] [address]  MRSA PCR Screening     Status: Abnormal   Collection Time: 05/25/17  5:02 AM  Result Value Ref Range   MRSA by PCR POSITIVE (A) NEGATIVE    Comment:        The GeneXpert MRSA Assay (FDA approved for NASAL specimens only), is one component of a comprehensive MRSA colonization surveillance program. It is not intended to diagnose MRSA infection nor to guide or monitor treatment for MRSA infections. RESULT CALLED TO, READ BACK BY AND VERIFIED WITH: MARCELLA TURNER AT [phone] ON 05/25/2017 JJB Performed at [facility], [address] [place], [place] [address]   Basic metabolic panel     Status: Abnormal   Collection Time: 05/25/17  5:38 AM  Result Value Ref Range   Sodium 133 (L) 135 - 145 mmol/L   Potassium 3.8 3.5 - 5.1 mmol/L   Chloride 97 (L) 101 - 111 mmol/L   CO2 25 22 - 32 mmol/L   Glucose, Bld 90 65 - 99 mg/dL   BUN 27 (H) 6 - 20 mg/dL   Creatinine, Ser 3.16 (H) 0.44 - 1.00 mg/dL   Calcium 8.4 (L) 8.9 - 10.3 mg/dL   GFR calc non Af Amer 12 (L) >60 mL/min   GFR calc Af Amer 14 (L) >60 mL/min    Comment: (NOTE) The eGFR has been calculated using the CKD EPI equation. This calculation has not been validated in all clinical situations. eGFR's persistently <60 mL/min signify possible Chronic Kidney Disease.    Anion gap 11 5 - 15    Comment: Performed at [facility], [address] [place], [place] [address]  CBC     Status: Abnormal   Collection Time: 05/25/17  5:38 AM  Result Value Ref Range   WBC 6.2 3.6 - 11.0 K/uL   RBC 3.80 3.80 - 5.20 MIL/uL   Hemoglobin 11.5 (L) 12.0 - 16.0 g/dL   HCT 34.3 (L) 35.0 - 47.0 %   MCV 90.3 80.0 - 100.0 fL   MCH 30.3 26.0 - 34.0 pg   MCHC 33.6 32.0 - 36.0 g/dL   RDW 15.8 (H) 11.5 - 14.5 %   Platelets 196 150 - 440 K/uL    Comment: Performed at [facility], [address] [place], [place] [address]  Cytology - Non PAP;     Status:  None   Collection Time: 05/25/17  2:11 PM  Result Value Ref Range   CYTOLOGY - NON GYN      Cytology - Non PAP CASE: [id number] PATIENT: Chauncy Lean Non-Gyn Cytology Report     SPECIMEN SUBMITTED: A. Pleural fluid, right  CLINICAL HISTORY: None Provided  PRE-OPERATIVE DIAGNOSIS: None provided  POST-OPERATIVE DIAGNOSIS: None provided.     DIAGNOSIS: A. PLEURAL FLUID, RIGHT; THORACENTESIS: - MIXED INFLAMMATION AND REACTIVE MESOTHELIAL CELLS.  Note: Cell block material, cytospin, and ThinPrep slides were reviewed.   GROSS DESCRIPTION: A1 and A2. Labeled: Right pleural fluid Received: Fresh Volume: 1400 mL Description: Cloudy yellow fluid in two evacuated containers Submitted for one ThinPrep and one cell block from each container   Final Diagnosis performed by Delorse Lek, MD.   Electronically signed 05/26/2017 2:45:03PM The electronic signature indicates that the named Attending Pathologist has evaluated the specimen  Technical component performed at [facility], [address] [place], [place] [address] Lab: [phone] Dir: Tera Partridge a, MD, MMM  Professional component performed at [facility], [address], [place], [place] [address] Lab: [phone] Dir: Dellia Nims. Rubinas, MD   Body fluid cell count with differential     Status: Abnormal   Collection Time: 05/25/17  2:11 PM  Result Value Ref Range   Fluid Type-FCT CYTOPLEU    Color, Fluid YELLOW (A) YELLOW   Appearance, Fluid CLEAR CLEAR   WBC, Fluid 144 cu mm   Neutrophil Count, Fluid 13 %   Lymphs, Fluid 24 %   Monocyte-Macrophage-Serous Fluid 62 %   Eos, Fluid 1 %   Other Cells, Fluid 0 %    Comment: Performed at [facility], [address] [place], [place] [address]  Albumin, pleural or peritoneal fluid     Status: None   Collection Time: 05/25/17  2:11 PM    Result Value Ref Range   Albumin, Fluid 1.6 g/dL    Comment: (NOTE) No normal range established for this test Results should be evaluated in conjunction with serum values    Fluid Type-FALB CYTOPLEU     Comment: Performed at [facility], [address] [place], [place] [address]  Protein, pleural or peritoneal fluid     Status: None   Collection Time: 05/25/17  2:11 PM  Result Value Ref Range   Total protein, fluid <3.0 g/dL    Comment: (NOTE) No normal range established for this test Results should be evaluated in conjunction with serum values    Fluid Type-FTP CYTOPLEU     Comment: Performed at [facility], [address] [place], [place] [address]  Glucose, pleural or peritoneal fluid     Status: None   Collection Time: 05/25/17  2:11 PM  Result Value Ref Range   Glucose, Fluid 115 mg/dL    Comment: (NOTE) No normal range established for this test Results should be evaluated in conjunction with serum values    Fluid Type-FGLU CYTOPLEU     Comment: Performed at [facility], [address] [place], [place] [address]  Protein, body fluid (other)     Status: None   Collection Time: 05/25/17  2:11 PM  Result Value Ref Range   Total Protein, Body Fluid Other 2.9 g/dL    Comment: (NOTE) ________________________________________________________ :  Peritoneal  :       Pleural          :   Synovial     : :______________:________________________:________________: :              :  Transudate :  Exudate  :                : :______________:____________:___________:________________: :  Not Estab.  :   <3 g/dL  :  >3 g/dL  :    <2.5 g/dL   : :______________:____________:___________:________________: The method performance specifications have not been established for this test in body fluid. The test result should be integrated into the clinical context for interpretation. The method performance specifications have not been established for this test  in body fluid.  The test result should be integrated into the clinical context for interpretation. Performed At: [facility] [place], [place] [id number] Rush Farmer MD [id number]    Source of Sample PLEURAL     Comment: Performed at [facility], [address] [place], [place] [address]  Body fluid culture     Status: None   Collection Time: 05/25/17  3:15 PM  Result Value Ref Range   Specimen Description      PLEURAL Performed at [facility], [address] [place], [place] [address]    Special Requests      NONE Performed at [facility], [place], [place] [address]    Gram Stain NO WBC SEEN NO ORGANISMS SEEN     Culture      NO GROWTH 3 DAYS Performed at [facility], [place] [address] [place], [place] [address]    Report Status 05/29/2017 FINAL   Phosphorus     Status: Abnormal   Collection Time: 05/26/17 10:41 AM  Result Value Ref Range   Phosphorus 4.7 (H) 2.5 - 4.6 mg/dL    Comment: Performed at [facility], [address] [place], [place] [address]  Hepatitis B surface antigen     Status: None   Collection Time: 05/26/17 10:41 AM  Result Value Ref Range   Hepatitis B Surface Ag Negative Negative    Comment: (NOTE) Performed At: [facility] [place], [place] [id number] Rush Farmer MD [phone] Performed at [facility], [address] [place], [place] [address]   Hepatitis B surface antibody     Status: Abnormal   Collection Time: 05/26/17 10:41 AM  Result Value Ref Range   Hepatitis B-Post 5.5 (L) Immunity>9.9 mIU/mL    Comment: (NOTE)  Status of Immunity                     Anti-HBs Level  ------------------                     -------------- Inconsistent with Immunity                   0.0 - 9.9 Consistent with Immunity                          >9.9 Performed At: [facility] [place], [place]  [id number] Rush Farmer MD [id number] Performed at [facility], [address] [place],  [address]   Gastrointestinal Panel by PCR , Stool     Status: None   Collection Time: 05/26/17  5:05 PM  Result Value Ref Range   Campylobacter species NOT DETECTED NOT DETECTED   Plesimonas shigelloides NOT DETECTED NOT DETECTED   Salmonella species NOT DETECTED NOT DETECTED   Yersinia enterocolitica NOT DETECTED NOT DETECTED   Vibrio species NOT DETECTED NOT DETECTED  Vibrio cholerae NOT DETECTED NOT DETECTED   Enteroaggregative E coli (EAEC) NOT DETECTED NOT DETECTED   Enteropathogenic E coli (EPEC) NOT DETECTED NOT DETECTED   Enterotoxigenic E coli (ETEC) NOT DETECTED NOT DETECTED   Shiga like toxin producing E coli (STEC) NOT DETECTED NOT DETECTED   Shigella/Enteroinvasive E coli (EIEC) NOT DETECTED NOT DETECTED   Cryptosporidium NOT DETECTED NOT DETECTED   Cyclospora cayetanensis NOT DETECTED NOT DETECTED   Entamoeba histolytica NOT DETECTED NOT DETECTED   Giardia lamblia NOT DETECTED NOT DETECTED   Adenovirus F40/41 NOT DETECTED NOT DETECTED   Astrovirus NOT DETECTED NOT DETECTED   Norovirus GI/GII NOT DETECTED NOT DETECTED   Rotavirus A NOT DETECTED NOT DETECTED   Sapovirus (I, II, IV, and V) NOT DETECTED NOT DETECTED    Comment: Performed at [facility], [address] [place], [place] [address]  C difficile quick scan w PCR reflex     Status: Abnormal   Collection Time: 05/26/17  5:05 PM  Result Value Ref Range   C Diff antigen POSITIVE (A) NEGATIVE   C Diff toxin NEGATIVE NEGATIVE   C Diff interpretation Results are indeterminate. See PCR results.     Comment: Performed at [facility], [address] [place], [place] [address]  C. Diff by PCR, Reflexed     Status: Abnormal   Collection Time: 05/26/17  5:05 PM  Result Value Ref Range   Toxigenic C. Difficile by PCR POSITIVE (A) NEGATIVE    Comment: Positive for toxigenic C.  difficile with little to no toxin production. Only treat if clinical presentation suggests symptomatic illness. Performed at [facility], [address] [place], [place] [address]   CBC     Status: Abnormal   Collection Time: 05/30/17  5:55 AM  Result Value Ref Range   WBC 8.0 3.6 - 11.0 K/uL   RBC 3.76 (L) 3.80 - 5.20 MIL/uL   Hemoglobin 11.3 (L) 12.0 - 16.0 g/dL   HCT 33.9 (L) 35.0 - 47.0 %   MCV 90.0 80.0 - 100.0 fL   MCH 30.0 26.0 - 34.0 pg   MCHC 33.3 32.0 - 36.0 g/dL   RDW 16.2 (H) 11.5 - 14.5 %   Platelets 219 150 - 440 K/uL    Comment: Performed at [facility], [address] [place], [place] [address]  Basic metabolic panel     Status: Abnormal   Collection Time: 05/30/17  5:55 AM  Result Value Ref Range   Sodium 133 (L) 135 - 145 mmol/L   Potassium 4.5 3.5 - 5.1 mmol/L   Chloride 96 (L) 101 - 111 mmol/L   CO2 25 22 - 32 mmol/L   Glucose, Bld 98 65 - 99 mg/dL   BUN 54 (H) 6 - 20 mg/dL   Creatinine, Ser 3.25 (H) 0.44 - 1.00 mg/dL   Calcium 8.5 (L) 8.9 - 10.3 mg/dL   GFR calc non Af Amer 12 (L) >60 mL/min   GFR calc Af Amer 14 (L) >60 mL/min    Comment: (NOTE) The eGFR has been calculated using the CKD EPI equation. This calculation has not been validated in all clinical situations. eGFR's persistently <60 mL/min signify possible Chronic Kidney Disease.    Anion gap 12 5 - 15    Comment: Performed at [facility], [address] [place],  [address]  Renal function panel     Status: Abnormal   Collection Time: 05/31/17  4:34 AM  Result Value Ref  Range   Sodium 133 (L) 135 - 145 mmol/L   Potassium 4.8 3.5 - 5.1 mmol/L   Chloride 95 (L) 101 - 111 mmol/L   CO2 24 22 - 32 mmol/L   Glucose, Bld 91 65 - 99 mg/dL   BUN 78 (H) 6 - 20 mg/dL   Creatinine, Ser 4.01 (H) 0.44 - 1.00 mg/dL   Calcium 8.8 (L) 8.9 - 10.3 mg/dL   Phosphorus 5.2 (H) 2.5 - 4.6 mg/dL   Albumin 3.2 (L) 3.5 - 5.0 g/dL   GFR calc non Af Amer 9 (L) >60 mL/min     GFR calc Af Amer 11 (L) >60 mL/min    Comment: (NOTE) The eGFR has been calculated using the CKD EPI equation. This calculation has not been validated in all clinical situations. eGFR's persistently <60 mL/min signify possible Chronic Kidney Disease.    Anion gap 14 5 - 15    Comment: Performed at [facility], [address] [place], [place] [address]    Radiology Dg Chest 1 View  Result Date: 05/25/2017 CLINICAL DATA:  Status post right thoracentesis. EXAM: CHEST  1 VIEW COMPARISON:  05/24/2017 FINDINGS: After thoracentesis there is significant reduction in right pleural fluid volume with small amount of fluid remaining. No pneumothorax following thoracentesis. Atelectasis and/or infiltrate of the underlying right lower lung present. No edema. Stable heart size. Stable appearance of pacemaker and left-sided dialysis catheter. IMPRESSION: Significant reduction in right pleural fluid volume after thoracentesis with no pneumothorax identified. Underlying atelectasis and/or infiltrate of the right lower lung. Electronically Signed   By: Aletta Edouard M.D.   On: 05/25/2017 16:27   Dg Chest 2 View  Result Date: 05/24/2017 CLINICAL DATA:  Tan sputum with lethargy EXAM: CHEST - 2 VIEW COMPARISON:  04/21/2017, 04/07/2017, 02/13/2017 FINDINGS: Moderate to large right pleural effusion. Atelectasis or pneumonia at the right middle lobe and right base. Partial atelectasis medial left base. Cardiomegaly with vascular congestion. Aortic atherosclerosis. No pneumothorax. Postsurgical changes of the right clavicle. Spine appears osteopenic with stable mild midthoracic wedging. IMPRESSION: 1. Moderate to large right pleural effusion with atelectasis or pneumonia at the right middle lobe and right base. 2. Cardiomegaly with vascular congestion. Electronically Signed   By: Donavan Foil M.D.   On: 05/24/2017 18:35   US Thoracentesis Asp Pleural Space W/img Guide  Result Date: 05/25/2017 CLINICAL  DATA:  Right pleural effusion. EXAM: ULTRASOUND GUIDED RIGHT THORACENTESIS COMPARISON:  Chest x-ray on 05/24/2017 PROCEDURE: An ultrasound guided thoracentesis was thoroughly discussed with the patient's brother and questions answered. The benefits, risks, alternatives and complications were also discussed. The patient's brother understands and wishes to proceed with the procedure. Written consent was obtained. Ultrasound was performed to localize and mark an adequate pocket of fluid in the right chest. The area was then prepped and draped in the normal sterile fashion. 1% Lidocaine was used for local anesthesia. Under ultrasound guidance a 6 French Safe-T-Centesis catheter was introduced. Thoracentesis was performed. The catheter was removed and a dressing applied. COMPLICATIONS: None FINDINGS: A total of approximately 1.4 L of dark, yellow fluid was removed. A fluid sample was sent for laboratory analysis. IMPRESSION: Successful ultrasound guided right thoracentesis yielding 1.4 L of pleural fluid. Electronically Signed   By: Aletta Edouard M.D.   On: 05/25/2017 14:52    Assessment/Plan  Benign essential hypertension blood pressure control important in reducing the progression of atherosclerotic disease. On appropriate oral medications.   Chronic systolic CHF (congestive heart failure), NYHA class  3 ([place]) Likely contributing to LE swelling  ESRD on dialysis [facility]) Using her catheter as much as the AVF.  Check duplex on the fistula to reassess.  Swelling of limb Venous duplex today unrevealing.  Likely from heart and renal disease as well as immobility.     Leotis Pain, MD  06/08/2017 4:41 PM    This note was created with Dragon medical transcription system.  Any errors from dictation are purely unintentional

## 2017-06-08 NOTE — Assessment & Plan Note (Signed)
Likely contributing to LE swelling

## 2017-06-08 NOTE — Assessment & Plan Note (Signed)
Using her catheter as much as the AVF.  Check duplex on the fistula to reassess.

## 2017-06-11 ENCOUNTER — Emergency Department
Admission: EM | Admit: 2017-06-11 | Discharge: 2017-06-11 | Disposition: A | Discharge status: Home / Self Care | Payer: Medicare Other | Attending: Emergency Medicine | Admitting: Emergency Medicine

## 2017-06-11 ENCOUNTER — Emergency Department: Payer: Medicare Other

## 2017-06-11 ENCOUNTER — Encounter: Payer: Self-pay | Admitting: Emergency Medicine

## 2017-06-11 DIAGNOSIS — F329 Major depressive disorder, single episode, unspecified: Secondary | ICD-10-CM | POA: Diagnosis not present

## 2017-06-11 DIAGNOSIS — Y9389 Activity, other specified: Secondary | ICD-10-CM | POA: Diagnosis not present

## 2017-06-11 DIAGNOSIS — F419 Anxiety disorder, unspecified: Secondary | ICD-10-CM | POA: Insufficient documentation

## 2017-06-11 DIAGNOSIS — S0990XA Unspecified injury of head, initial encounter: Secondary | ICD-10-CM

## 2017-06-11 DIAGNOSIS — I5022 Chronic systolic (congestive) heart failure: Secondary | ICD-10-CM | POA: Diagnosis not present

## 2017-06-11 DIAGNOSIS — I252 Old myocardial infarction: Secondary | ICD-10-CM | POA: Diagnosis not present

## 2017-06-11 DIAGNOSIS — Z96652 Presence of left artificial knee joint: Secondary | ICD-10-CM | POA: Insufficient documentation

## 2017-06-11 DIAGNOSIS — Z87891 Personal history of nicotine dependence: Secondary | ICD-10-CM | POA: Insufficient documentation

## 2017-06-11 DIAGNOSIS — N186 End stage renal disease: Secondary | ICD-10-CM | POA: Diagnosis not present

## 2017-06-11 DIAGNOSIS — I2510 Atherosclerotic heart disease of native coronary artery without angina pectoris: Secondary | ICD-10-CM | POA: Insufficient documentation

## 2017-06-11 DIAGNOSIS — Z992 Dependence on renal dialysis: Secondary | ICD-10-CM | POA: Insufficient documentation

## 2017-06-11 DIAGNOSIS — Y92129 Unspecified place in nursing home as the place of occurrence of the external cause: Secondary | ICD-10-CM | POA: Insufficient documentation

## 2017-06-11 DIAGNOSIS — W19XXXA Unspecified fall, initial encounter: Secondary | ICD-10-CM

## 2017-06-11 DIAGNOSIS — I132 Hypertensive heart and chronic kidney disease with heart failure and with stage 5 chronic kidney disease, or end stage renal disease: Secondary | ICD-10-CM | POA: Insufficient documentation

## 2017-06-11 DIAGNOSIS — Z9049 Acquired absence of other specified parts of digestive tract: Secondary | ICD-10-CM | POA: Insufficient documentation

## 2017-06-11 DIAGNOSIS — Y998 Other external cause status: Secondary | ICD-10-CM | POA: Diagnosis not present

## 2017-06-11 DIAGNOSIS — W0110XA Fall on same level from slipping, tripping and stumbling with subsequent striking against unspecified object, initial encounter: Secondary | ICD-10-CM | POA: Diagnosis not present

## 2017-06-11 DIAGNOSIS — Z85828 Personal history of other malignant neoplasm of skin: Secondary | ICD-10-CM | POA: Insufficient documentation

## 2017-06-11 DIAGNOSIS — Z79899 Other long term (current) drug therapy: Secondary | ICD-10-CM | POA: Diagnosis not present

## 2017-06-11 NOTE — ED Notes (Signed)
Patients skin tear have been steri-stripped and bandaged by the facility prior to arrival.  Good color and tears have been approximated well by the facility at this time. Patient in NAD.

## 2017-06-11 NOTE — ED Notes (Signed)
Patient moved to a hallway bed to await for transport.

## 2017-06-11 NOTE — ED Notes (Signed)
Patient is lying with eyes closed, but nods or shakes head for answers at this time. Patient denies being in pain, declined for this RN to speak to her brother on the phone.

## 2017-06-11 NOTE — ED Provider Notes (Signed)
[facility] Emergency Department Provider Note   ____________________________________________    I have reviewed the triage vital signs and the nursing notes.   HISTORY  Chief Complaint Fall  History significantly limited   HPI STORMEY WILBORN is a 84 y.o. female who presents after an unwitnessed fall.  Patient has significant medical history as detailed below.  Apparently was found down, no further history was given per EMS   Past Medical History:  Diagnosis Date  . Anemia   . Arthritis   . CAD (coronary artery disease)   . Cancer ([place])    skin  . Cardiomyopathy ([place])   . CHF (congestive heart failure) ([place])   . Depression   . IBS (irritable bowel syndrome) 2010  . Kidney failure July 2012   Hemodialysis 3xweek  . Kidney failure   . Meniere disease   . Meniere's disease   . Myocardial infarction ([place])   . OSA (obstructive sleep apnea)    CPAP  . Peritoneal dialysis status ([place])   . Presence of IVC filter 2019  . Presence of permanent cardiac pacemaker   . Renal insufficiency     Patient Active Problem List   Diagnosis Date Noted  . Swelling of limb 06/08/2017  . C. difficile diarrhea   . Palliative care encounter   . Encounter for hospice care discussion   . Protein-calorie malnutrition, severe 05/25/2017  . Pneumonia 05/24/2017  . Complication from renal dialysis device 04/01/2017  . Displaced fracture of lateral end of right clavicle, initial encounter for closed fracture 02/16/2017  . FTT (failure to thrive) in adult 02/03/2017  . ESRD on hemodialysis ([place])   . Pleural effusion   . Palliative care by specialist   . Pulmonary embolism ([place]) 01/02/2017  . Syncope 01/01/2017  . Vision changes 08/27/2016  . Closed fracture of neck of femur ([place]) 05/26/2016  . Hip fracture ([place]) 05/10/2016  . Falls 04/27/2016  . Head injury 04/27/2016  . Age-related osteoporosis with current pathological fracture with routine healing  04/24/2016  . Recurrent falls 04/24/2016  . Mild protein-calorie malnutrition ([place]) 04/24/2016  . ESRD on dialysis ([place]) 04/10/2016  . Periprosthetic fracture around internal prosthetic left knee joint 01/26/2016  . Pressure injury of skin 12/15/2015  . Closed left subtrochanteric femur fracture ([place]) 12/15/2015  . Femur fracture, left ([place]) 12/14/2015  . Meniere disease   . Loss of weight 10/21/2015  . Clinical depression 06/20/2015  . Dysphagia, unspecified 05/24/2015  . Imbalance 04/22/2015  . Chronic pain syndrome 05/22/2014  . Insomnia 03/13/2014  . Anxiety state 03/13/2014  . Chronic systolic CHF (congestive heart failure), NYHA class 3 ([place]) 01/15/2014  . OSA (obstructive sleep apnea) 01/15/2014  . Basal cell carcinoma of neck 11/14/2013  . DDD (degenerative disc disease), cervical 11/07/2013  . Cervical radiculitis 10/16/2013  . Benign essential hypertension 09/12/2013  . Memory loss 09/12/2013  . Systolic heart failure, chronic ([place]) 05/12/2013  . Goals of care, counseling/discussion 11/10/2012  . Sinus node dysfunction ([place]) 08/01/2012  . Low back pain 08/01/2012  . Cervical spine pain 05/02/2012  . Irritable bowel syndrome 11/02/2011  . Depression 04/01/2011  . Hypertension 04/01/2011    Past Surgical History:  Procedure Laterality Date  . [place]  . ABDOMINAL HYSTERECTOMY    . ANUS SURGERY  2010  . AV FISTULA PLACEMENT Right 04/15/2016   Procedure: ARTERIOVENOUS (AV) FISTULA CREATION ( RADIOCEPHALIC ) STAGE 2;  Surgeon: Algernon Huxley, MD;  Location: [facility] ORS;  Service: Vascular;  Laterality: Right;  . CATARACT EXTRACTION  2006, 2011  . CHOLECYSTECTOMY  01/2008  . DIALYSIS/PERMA CATHETER INSERTION N/A 01/07/2017   Procedure: DIALYSIS/PERMA CATHETER INSERTION With an IVC filter placement;  Surgeon: Algernon Huxley, MD;  Location: [place] [facility];  Service: Cardiovascular;  Laterality: N/A;  . DIALYSIS/PERMA CATHETER REMOVAL N/A 08/20/2016    Procedure: Dialysis/Perma Catheter Removal;  Surgeon: Algernon Huxley, MD;  Location: [place] CV LAB;  Service: Cardiovascular;  Laterality: N/A;  . EYE SURGERY     cataracts bilateral  . FEMUR IM NAIL Left 12/15/2015   Procedure: INTRAMEDULLARY (IM) RETROGRADE FEMORAL NAILING;  Surgeon: Oletta Cohn, DO;  Location: [facility] ORS;  Service: Orthopedics;  Laterality: Left;  . HIP PINNING,CANNULATED Left 05/10/2016   Procedure: CANNULATED HIP PINNING;  Surgeon: Earnestine Leys, MD;  Location: [facility] ORS;  Service: Orthopedics;  Laterality: Left;  . INSERT / REPLACE / REMOVE PACEMAKER    . INSERTION OF DIALYSIS CATHETER  07/2010  . IVC FILTER INSERTION  2019  . JOINT REPLACEMENT     left knee  . MINOR REMOVAL OF PERITONEAL DIALYSIS CATHETER  04/15/2016   Procedure: MINOR REMOVAL OF PERITONEAL DIALYSIS CATHETER;  Surgeon: Algernon Huxley, MD;  Location: [facility] ORS;  Service: Vascular;;  . ORIF CLAVICULAR FRACTURE Right 02/16/2017   Procedure: OPEN REDUCTION INTERNAL FIXATION (ORIF) CLAVICULAR FRACTURE;  Surgeon: Corky Mull, MD;  Location: [facility] ORS;  Service: Orthopedics;  Laterality: Right;  . PACEMAKER INSERTION  2006  . PERIPHERAL VASCULAR CATHETERIZATION N/A 12/18/2015   Procedure: Dialysis/Perma Catheter Insertion;  Surgeon: Katha Cabal, MD;  Location: [place] [facility] LAB;  Service: Cardiovascular;  Laterality: N/A;  . TOTAL KNEE ARTHROPLASTY  2008   LEFT/[facility] [place]    Prior to Admission medications   Medication Sig Start Date End Date Taking? Authorizing Provider  acetaminophen (TYLENOL) 325 MG tablet Take 2 tablets (650 mg total) by mouth every 4 (four) hours as needed for mild pain or moderate pain. 05/14/16  Yes Wieting, Richard, MD  benzonatate (TESSALON) 200 MG capsule Take 1 capsule (200 mg total) by mouth 3 (three) times daily. 06/01/17  Yes Dustin Flock, MD  guaiFENesin-dextromethorphan [facility] DM) 100-10 MG/5ML syrup Take 5 mLs by mouth every 4 (four) hours as needed for cough.  06/01/17  Yes Dustin Flock, MD  hydrocortisone 2.5 % cream Apply topically 3 (three) times daily.   Yes [provider]  levothyroxine (SYNTHROID, LEVOTHROID) 25 MCG tablet Take 1 tablet (25 mcg total) by mouth daily. 02/04/17  Yes Mody, Ulice Bold, MD  metoprolol succinate (TOPROL-XL) 25 MG 24 hr tablet Take 1 tablet (25 mg total) by mouth daily. 06/01/17  Yes Dustin Flock, MD  multivitamin (RENA-VIT) TABS tablet Take 1 tablet by mouth daily.   Yes [provider]  sertraline (ZOLOFT) 100 MG tablet Take 150 mg by mouth at bedtime.    Yes [provider]  sevelamer carbonate (RENVELA) 800 MG tablet Take 1 tablet (800 mg total) by mouth 3 (three) times daily with meals. 06/01/17  Yes Dustin Flock, MD  traMADol (ULTRAM) 50 MG tablet Take 1-2 tablets (50-100 mg total) by mouth every 6 (six) hours as needed for moderate pain. Patient taking differently: Take 50-100 mg by mouth every 6 (six) hours as needed (for mild to moderate pain).  06/01/17  Yes Dustin Flock, MD  Vitamin D, Ergocalciferol, (DRISDOL) 50000 units CAPS capsule Take 50,000 Units by mouth every Monday.  04/17/15  Yes [provider]     Allergies Statins; Codeine sulfate; Codeine; Effexor [venlafaxine]; and Ezetimibe-simvastatin  Family History  Problem Relation Age of Onset  . Cancer Mother        ? ovarian - sarcoma  . Cancer Father        Skin cancer  . Diabetes Sister   . COPD Sister   . Depression Sister   . Cancer Sister        Lung - 60 yrs old    Social History Social History   Tobacco Use  . Smoking status: Former Smoker    Last attempt to quit: 03/31/1948    Years since quitting: 69.2  . Smokeless tobacco: Never Used  Substance Use Topics  . Alcohol use: No  . Drug use: No    Review of Systems  Constitutional: No reports of fever Eyes: No discharge ENT: No neck pain Cardiovascular: Denies chest pain. Respiratory: Denies shortness of breath. Gastrointestinal: no  vomiting.   Genitourinary: No reports of foul-smelling urine Musculoskeletal: Hip pain Skin: Skin tears Neurological: No focal deficits   ____________________________________________   PHYSICAL EXAM:  VITAL SIGNS: ED Triage Vitals  Enc Vitals Group     BP 06/11/17 1127 (!) 147/75     Pulse Rate 06/11/17 1127 69     Resp 06/11/17 1127 15     Temp 06/11/17 1127 97.9 F (36.6 C)     Temp Source 06/11/17 1127 Oral     SpO2 06/11/17 1127 99 %     Weight 06/11/17 1130 47.2 kg (104 lb)     Height 06/11/17 1130 1.626 m (5\' 4" )     Head Circumference --      Peak Flow --      Pain Score 06/11/17 1257 0     Pain Loc --      Pain Edu? --      Excl. in [place]? --     Constitutional: Alert No acute distress Eyes: Conjunctivae are normal.  Head: Hematoma over right lateral scalp Nose: No swelling or epistaxis Mouth/Throat: Mucous membranes are moist.   Neck:  Painless ROM, no vertebral tenderness to palpation  cardiovascular: Normal rate, regular rhythm. Grossly normal heart sounds.  Good peripheral circulation. Respiratory: Normal respiratory effort.  No retractions.  GI: No abdominal tenderness to palpation Musculoskeletal: Full range of motion of all extremities without pain.  No pain with axial load on both hips, full range of motion of both hips.  No tenderness palpation of the pelvis   Warm and well perfused extremities, no vertebral tenderness to palpation Neurologic:  No gross focal neurologic deficits are appreciated.  Skin:  Skin is warm, dry.  Skin tear right shoulder, left forearm, mild   ____________________________________________   LABS (all labs ordered are listed, but only abnormal results are displayed)  Labs Reviewed - No data to display ____________________________________________  EKG  None ____________________________________________  RADIOLOGY  CT head negative ____________________________________________   PROCEDURES  Procedure(s) performed:  No  Procedures   Critical Care performed: No ____________________________________________   INITIAL IMPRESSION / ASSESSMENT AND PLAN / ED COURSE  Pertinent labs & imaging results that were available during my care of the patient were reviewed by me and considered in my medical decision making (see chart for details).  Only injuries noted on thorough physical exam her skin tear to the left wrist, skin tear to the right shoulder and hematoma to the right scalp.  Skin tears are dressed, CT head negative for  any acute injury.  Appropriate for discharge at this time with further outpatient follow-up    ____________________________________________   FINAL CLINICAL IMPRESSION(S) / ED DIAGNOSES  Final diagnoses:  Injury of head, initial encounter  Fall, initial encounter        Note:  This document was prepared using Dragon voice recognition software and may include unintentional dictation errors.    Lavonia Drafts, MD 06/11/17 [phone]

## 2017-06-11 NOTE — ED Notes (Signed)
Report given to Danae Orleans

## 2017-06-11 NOTE — ED Notes (Signed)
Wound on right shoulder re-dressed with sterile gauze. Steri-strips remain in place.

## 2017-06-11 NOTE — ED Notes (Signed)
Call made to patient's brother Santina Evans who is POA at [phone] to inform him that patient is being discharged back to [facility] via EMS.

## 2017-06-11 NOTE — ED Notes (Signed)
This tech and Megan, rn changed pt brief to ensure pt dry, and ensured pt o2 sat was appropriate for pt baseline.  Pt moved from 5hall to 1 hall so that pt could be seen at all times

## 2017-06-11 NOTE — ED Triage Notes (Signed)
Pt comes into the Ed via [facility] from [facility] where she had an unwitnessed fall.  Patient is bed bound and wears 2L chronically.  Patient denies any LOC or blood thinner use.  Patient has hematoma present to the right side of her head and skin tears to the right shoulder and left forearm.  Patient in NAD at this time with even and unlabored respirations.

## 2017-06-11 NOTE — ED Notes (Signed)
Waiting for transport

## 2017-06-15 ENCOUNTER — Other Ambulatory Visit (INDEPENDENT_AMBULATORY_CARE_PROVIDER_SITE_OTHER): Payer: Self-pay | Admitting: Vascular Surgery

## 2017-06-15 ENCOUNTER — Encounter (INDEPENDENT_AMBULATORY_CARE_PROVIDER_SITE_OTHER): Payer: Self-pay

## 2017-06-16 ENCOUNTER — Emergency Department: Payer: No Typology Code available for payment source

## 2017-06-16 ENCOUNTER — Emergency Department
Admission: EM | Admit: 2017-06-16 | Discharge: 2017-06-16 | Disposition: A | Discharge status: Skilled Nursing Facility | Payer: No Typology Code available for payment source | Attending: Emergency Medicine | Admitting: Emergency Medicine

## 2017-06-16 ENCOUNTER — Encounter: Payer: Self-pay | Admitting: Medical Oncology

## 2017-06-16 ENCOUNTER — Other Ambulatory Visit: Payer: Self-pay

## 2017-06-16 DIAGNOSIS — W0110XA Fall on same level from slipping, tripping and stumbling with subsequent striking against unspecified object, initial encounter: Secondary | ICD-10-CM | POA: Insufficient documentation

## 2017-06-16 DIAGNOSIS — W19XXXA Unspecified fall, initial encounter: Secondary | ICD-10-CM

## 2017-06-16 DIAGNOSIS — R7989 Other specified abnormal findings of blood chemistry: Secondary | ICD-10-CM

## 2017-06-16 DIAGNOSIS — S0990XA Unspecified injury of head, initial encounter: Secondary | ICD-10-CM | POA: Diagnosis present

## 2017-06-16 DIAGNOSIS — R778 Other specified abnormalities of plasma proteins: Secondary | ICD-10-CM

## 2017-06-16 DIAGNOSIS — Y9301 Activity, walking, marching and hiking: Secondary | ICD-10-CM | POA: Diagnosis not present

## 2017-06-16 DIAGNOSIS — I110 Hypertensive heart disease with heart failure: Secondary | ICD-10-CM | POA: Insufficient documentation

## 2017-06-16 DIAGNOSIS — R748 Abnormal levels of other serum enzymes: Secondary | ICD-10-CM | POA: Diagnosis not present

## 2017-06-16 DIAGNOSIS — J90 Pleural effusion, not elsewhere classified: Secondary | ICD-10-CM | POA: Diagnosis not present

## 2017-06-16 DIAGNOSIS — R52 Pain, unspecified: Secondary | ICD-10-CM

## 2017-06-16 DIAGNOSIS — Z992 Dependence on renal dialysis: Secondary | ICD-10-CM | POA: Diagnosis not present

## 2017-06-16 DIAGNOSIS — Y999 Unspecified external cause status: Secondary | ICD-10-CM | POA: Insufficient documentation

## 2017-06-16 DIAGNOSIS — Y929 Unspecified place or not applicable: Secondary | ICD-10-CM | POA: Insufficient documentation

## 2017-06-16 DIAGNOSIS — N186 End stage renal disease: Secondary | ICD-10-CM | POA: Diagnosis not present

## 2017-06-16 DIAGNOSIS — I2510 Atherosclerotic heart disease of native coronary artery without angina pectoris: Secondary | ICD-10-CM | POA: Diagnosis not present

## 2017-06-16 DIAGNOSIS — Z950 Presence of cardiac pacemaker: Secondary | ICD-10-CM | POA: Diagnosis not present

## 2017-06-16 DIAGNOSIS — I5022 Chronic systolic (congestive) heart failure: Secondary | ICD-10-CM | POA: Insufficient documentation

## 2017-06-16 DIAGNOSIS — S0083XA Contusion of other part of head, initial encounter: Secondary | ICD-10-CM | POA: Insufficient documentation

## 2017-06-16 DIAGNOSIS — Z87891 Personal history of nicotine dependence: Secondary | ICD-10-CM | POA: Diagnosis not present

## 2017-06-16 LAB — LACTIC ACID, PLASMA: Lactic Acid, Venous: 1.4 mmol/L (ref 0.5–1.9)

## 2017-06-16 LAB — CBC WITH DIFFERENTIAL/PLATELET
Basophils Absolute: 0 10*3/uL (ref 0–0.1)
Basophils Relative: 1 %
Eosinophils Absolute: 0.2 10*3/uL (ref 0–0.7)
Eosinophils Relative: 4 %
HEMATOCRIT: 34 % — AB (ref 35.0–47.0)
HEMOGLOBIN: 11.5 g/dL — AB (ref 12.0–16.0)
LYMPHS PCT: 19 %
Lymphs Abs: 1.1 10*3/uL (ref 1.0–3.6)
MCH: 30.7 pg (ref 26.0–34.0)
MCHC: 33.8 g/dL (ref 32.0–36.0)
MCV: 90.7 fL (ref 80.0–100.0)
MONO ABS: 0.4 10*3/uL (ref 0.2–0.9)
Monocytes Relative: 7 %
NEUTROS ABS: 4.1 10*3/uL (ref 1.4–6.5)
Neutrophils Relative %: 69 %
Platelets: 227 10*3/uL (ref 150–440)
RBC: 3.74 MIL/uL — ABNORMAL LOW (ref 3.80–5.20)
RDW: 18.6 % — AB (ref 11.5–14.5)
WBC: 5.8 10*3/uL (ref 3.6–11.0)

## 2017-06-16 LAB — COMPREHENSIVE METABOLIC PANEL
ALBUMIN: 3.5 g/dL (ref 3.5–5.0)
ALK PHOS: 104 U/L (ref 38–126)
ALT: 19 U/L (ref 14–54)
AST: 36 U/L (ref 15–41)
Anion gap: 12 (ref 5–15)
BUN: 21 mg/dL — ABNORMAL HIGH (ref 6–20)
CALCIUM: 8.6 mg/dL — AB (ref 8.9–10.3)
CO2: 28 mmol/L (ref 22–32)
CREATININE: 2.32 mg/dL — AB (ref 0.44–1.00)
Chloride: 97 mmol/L — ABNORMAL LOW (ref 101–111)
GFR calc non Af Amer: 18 mL/min — ABNORMAL LOW (ref >60)
GFR, EST AFRICAN AMERICAN: 20 mL/min — AB (ref >60)
GLUCOSE: 75 mg/dL (ref 65–99)
Potassium: 3.6 mmol/L (ref 3.5–5.1)
SODIUM: 137 mmol/L (ref 135–145)
Total Bilirubin: 1.5 mg/dL — ABNORMAL HIGH (ref 0.3–1.2)
Total Protein: 7.2 g/dL (ref 6.5–8.1)

## 2017-06-16 LAB — TROPONIN I: Troponin I: 0.1 ng/mL (ref <0.03)

## 2017-06-16 LAB — LIPASE, BLOOD: Lipase: 59 U/L — ABNORMAL HIGH (ref 11–51)

## 2017-06-16 NOTE — ED Triage Notes (Signed)
Pt to ED via ems from Dialysis, pt is a resident at [facility] and fell this am. Pt unsure of what caused her fall. Pt was sent to Dialysis after fall and completed her treatment prior to coming to ED. Pt has had 3 falls since Friday. Pt has hematoma to forehead, pain to rt shoulder, and scattered bruising.

## 2017-06-16 NOTE — ED Notes (Signed)
Spoke with CJ nurse at [facility] and informed her of pt's discharge orders and that patient would be transported back by EMS shortly.

## 2017-06-16 NOTE — ED Provider Notes (Signed)
[facility] Emergency Department Provider Note  ____________________________________________   First MD Initiated Contact with Patient 06/16/17 1721     (approximate)  I have reviewed the triage vital signs and the nursing notes.   HISTORY  Chief Complaint Fall   Level 5 caveat:  history/ROS likely limited by altered mental status/confusion or acute illness    HPI Sharon [name] is a 84 y.o. female with extensive medical history as listed below and history of frequent falls with 8 visits to the emergency department in 6 months and for admissions.  She presents by EMS today after a fall.  Reportedly she had her full course of dialysis this morning and completed her treatment but then had a fall at some point.  The facility reported that she has had 3 falls in the last 5 days.  She was seen here in the emergency department after 1 of those falls 5 days ago and had skin tears to her left wrist, right shoulder and a hematoma on the right scalp.  She also had bruising on her head and face.  Her work-up at the time was reassuring and she went home, but prior to that she had been admitted for having a large pleural effusion and suspected pneumonia.  She also has documented history of encephalopathy when she is ill but no documentation of dementia.  She is currently reporting pain in the middle of her chest that is worse with moving and she states that she thinks she has a broken rib.  She also reports pain in the upper middle part of her back after a fall that happened recently, but she is not sure which one.  She does grimace and wince and cry out a little bit when she is transferred, for example, from the EMS stretcher to the ED bed.  She also says that taking a deep breath makes the chest pain worse.  She denies fever/chills, difficulty breathing, nausea, vomiting, and abdominal pain.  Past Medical History:  Diagnosis Date  . Anemia   . Arthritis   . CAD (coronary  artery disease)   . Cancer ([place])    skin  . Cardiomyopathy ([place])   . CHF (congestive heart failure) ([place])   . Depression   . IBS (irritable bowel syndrome) 2010  . Kidney failure July 2012   Hemodialysis 3xweek  . Kidney failure   . Meniere disease   . Meniere's disease   . Myocardial infarction ([place])   . OSA (obstructive sleep apnea)    CPAP  . Peritoneal dialysis status ([employer])   . Presence of IVC filter 2019  . Presence of permanent cardiac pacemaker   . Renal insufficiency     Patient Active Problem List   Diagnosis Date Noted  . Swelling of limb 06/08/2017  . C. difficile diarrhea   . Palliative care encounter   . Encounter for hospice care discussion   . Protein-calorie malnutrition, severe 05/25/2017  . Pneumonia 05/24/2017  . Complication from renal dialysis device 04/01/2017  . Displaced fracture of lateral end of right clavicle, initial encounter for closed fracture 02/16/2017  . FTT (failure to thrive) in adult 02/03/2017  . ESRD on hemodialysis ([place])   . Pleural effusion   . Palliative care by specialist   . Pulmonary embolism ([place]) 01/02/2017  . Syncope 01/01/2017  . Vision changes 08/27/2016  . Closed fracture of neck of femur ([place]) 05/26/2016  . Hip fracture ([place]) 05/10/2016  . Falls 04/27/2016  .  Head injury 04/27/2016  . Age-related osteoporosis with current pathological fracture with routine healing 04/24/2016  . Recurrent falls 04/24/2016  . Mild protein-calorie malnutrition ([place]) 04/24/2016  . ESRD on dialysis ([place]) 04/10/2016  . Periprosthetic fracture around internal prosthetic left knee joint 01/26/2016  . Pressure injury of skin 12/15/2015  . Closed left subtrochanteric femur fracture ([place]) 12/15/2015  . Femur fracture, left ([place]) 12/14/2015  . Meniere disease   . Loss of weight 10/21/2015  . Clinical depression 06/20/2015  . Dysphagia, unspecified 05/24/2015  . Imbalance 04/22/2015  . Chronic pain syndrome 05/22/2014  . Insomnia  03/13/2014  . Anxiety state 03/13/2014  . Chronic systolic CHF (congestive heart failure), NYHA class 3 ([place]) 01/15/2014  . OSA (obstructive sleep apnea) 01/15/2014  . Basal cell carcinoma of neck 11/14/2013  . DDD (degenerative disc disease), cervical 11/07/2013  . Cervical radiculitis 10/16/2013  . Benign essential hypertension 09/12/2013  . Memory loss 09/12/2013  . Systolic heart failure, chronic ([place]) 05/12/2013  . Goals of care, counseling/discussion 11/10/2012  . Sinus node dysfunction ([place]) 08/01/2012  . Low back pain 08/01/2012  . Cervical spine pain 05/02/2012  . Irritable bowel syndrome 11/02/2011  . Depression 04/01/2011  . Hypertension 04/01/2011    Past Surgical History:  Procedure Laterality Date  . [place]  . ABDOMINAL HYSTERECTOMY    . ANUS SURGERY  2010  . AV FISTULA PLACEMENT Right 04/15/2016   Procedure: ARTERIOVENOUS (AV) FISTULA CREATION ( RADIOCEPHALIC ) STAGE 2;  Surgeon: Algernon Huxley, MD;  Location: [facility] ORS;  Service: Vascular;  Laterality: Right;  . CATARACT EXTRACTION  2006, 2011  . CHOLECYSTECTOMY  01/2008  . DIALYSIS/PERMA CATHETER INSERTION N/A 01/07/2017   Procedure: DIALYSIS/PERMA CATHETER INSERTION With an IVC filter placement;  Surgeon: Algernon Huxley, MD;  Location: [place] [facility];  Service: Cardiovascular;  Laterality: N/A;  . DIALYSIS/PERMA CATHETER REMOVAL N/A 08/20/2016   Procedure: Dialysis/Perma Catheter Removal;  Surgeon: Algernon Huxley, MD;  Location: [place] [facility];  Service: Cardiovascular;  Laterality: N/A;  . EYE SURGERY     cataracts bilateral  . FEMUR IM NAIL Left 12/15/2015   Procedure: INTRAMEDULLARY (IM) RETROGRADE FEMORAL NAILING;  Surgeon: Oletta Cohn, DO;  Location: [facility] OR[facility];  Service: Orthopedics;  Laterality: Left;  . HIP PINNING,CANNULATED Left 05/10/2016   Procedure: CANNULATED HIP PINNING;  Surgeon: Earnestine Leys, MD;  Location: [facility];  Service: Orthopedics;  Laterality: Left;  . INSERT  / REPLACE / REMOVE PACEMAKER    . INSERTION OF DIALYSIS CATHETER  07/2010  . IVC FILTER INSERTION  2019  . JOINT REPLACEMENT     left knee  . MINOR REMOVAL OF PERITONEAL DIALYSIS CATHETER  04/15/2016   Procedure: MINOR REMOVAL OF PERITONEAL DIALYSIS CATHETER;  Surgeon: Algernon Huxley, MD;  Location: [facility];  Service: Vascular;;  . ORIF CLAVICULAR FRACTURE Right 02/16/2017   Procedure: OPEN REDUCTION INTERNAL FIXATION (ORIF) CLAVICULAR FRACTURE;  Surgeon: Corky Mull, MD;  Location: [facility] OR[facility];  Service: Orthopedics;  Laterality: Right;  . PACEMAKER INSERTION  2006  . PERIPHERAL VASCULAR CATHETERIZATION N/A 12/18/2015   Procedure: Dialysis/Perma Catheter Insertion;  Surgeon: Katha Cabal, MD;  Location: [place] [facility];  Service: Cardiovascular;  Laterality: N/A;  . TOTAL KNEE ARTHROPLASTY  2008   LEFT/Dr [place]    Prior to Admission medications   Medication Sig Start Date End Date Taking? Authorizing Provider  acetaminophen (TYLENOL) 325 MG tablet Take 2 tablets (650 mg total) by mouth  every 4 (four) hours as needed for mild pain or moderate pain. 05/14/16   Loletha Grayer, MD  benzonatate (TESSALON) 200 MG capsule Take 1 capsule (200 mg total) by mouth 3 (three) times daily. 06/01/17   Dustin Flock, MD  guaiFENesin-dextromethorphan (ROBITUSSIN DM) 100-10 MG/5ML syrup Take 5 mLs by mouth every 4 (four) hours as needed for cough. 06/01/17   Dustin Flock, MD  hydrocortisone 2.5 % cream Apply topically 3 (three) times daily.    [provider]  levothyroxine (SYNTHROID, LEVOTHROID) 25 MCG tablet Take 1 tablet (25 mcg total) by mouth daily. 02/04/17   Bettey Costa, MD  metoprolol succinate (TOPROL-XL) 25 MG 24 hr tablet Take 1 tablet (25 mg total) by mouth daily. 06/01/17   Dustin Flock, MD  multivitamin (RENA-VIT) TABS tablet Take 1 tablet by mouth daily.    [provider]  sertraline (ZOLOFT) 100 MG tablet Take 150 mg by mouth at bedtime.     [provider]    sevelamer carbonate (RENVELA) 800 MG tablet Take 1 tablet (800 mg total) by mouth 3 (three) times daily with meals. 06/01/17   Dustin Flock, MD  traMADol (ULTRAM) 50 MG tablet Take 1-2 tablets (50-100 mg total) by mouth every 6 (six) hours as needed for moderate pain. Patient taking differently: Take 50-100 mg by mouth every 6 (six) hours as needed (for mild to moderate pain).  06/01/17   Dustin Flock, MD  Vitamin D, Ergocalciferol, (DRISDOL) 50000 units CAPS capsule Take 50,000 Units by mouth every Monday.  04/17/15   [provider]    Allergies Statins; Codeine sulfate; Codeine; Effexor [venlafaxine]; and Ezetimibe-simvastatin  Family History  Problem Relation Age of Onset  . Cancer Mother        ? ovarian - sarcoma  . Cancer Father        Skin cancer  . Diabetes Sister   . COPD Sister   . Depression Sister   . Cancer Sister        Lung - 3 yrs old    Social History Social History   Tobacco Use  . Smoking status: Former Smoker    Last attempt to quit: 03/31/1948    Years since quitting: 69.2  . Smokeless tobacco: Never Used  Substance Use Topics  . Alcohol use: No  . Drug use: No    Review of Systems Level 5 caveat:  history/ROS likely limited by altered mental status/confusion or acute illness  Constitutional: No fever/chills Eyes: No visual changes. ENT: No sore throat. Cardiovascular: Denies chest pain. Respiratory: Denies shortness of breath. Gastrointestinal: No abdominal pain.  No nausea, no vomiting.  No diarrhea.  No constipation. Genitourinary: Negative for dysuria. Musculoskeletal: Negative for neck pain.  Negative for back pain. Integumentary: Negative for rash. Neurological: Negative for headaches, focal weakness or numbness.   ____________________________________________   PHYSICAL EXAM:  VITAL SIGNS: ED Triage Vitals  Enc Vitals Group     BP 06/16/17 1725 (!) 162/74     Pulse Rate 06/16/17 1725 73     Resp 06/16/17 1725 16      Temp 06/16/17 1725 97.7 F (36.5 C)     Temp Source 06/16/17 1725 Oral     SpO2 06/16/17 1725 100 %     Weight 06/16/17 1726 47.2 kg (104 lb)     Height 06/16/17 1726 1.626 m (5\' 4" )     Head Circumference --      Peak Flow --      Pain Score  06/16/17 1726 8     Pain Loc --      Pain Edu? --      Excl. in [place]? --     Constitutional: Alert but disoriented, continues to ask for her brother and her sister, clearly does not know that she is in the emergency department or what exactly happened.  Very talkative and interactive.  No acute distress although pain with movement. Eyes: Conjunctivae are normal. PERRL. EOMI. Head: Extensive bruising of various ages all throughout her forehead, face, and hematoma that is resolving on the back of her head.  No evidence of injuries that are obviously acute.  No facial or head lacerations Nose: No congestion/rhinnorhea.  No epistaxis. Mouth/Throat: Mucous membranes are moist. Neck: No stridor.  No meningeal signs.  No tenderness to palpation of the cervical spine. Cardiovascular: Normal rate, regular rhythm. Good peripheral circulation. Grossly normal heart sounds.  Reproducible tenderness to palpation of the sternum and pain in the sternum when she sits up or moves around or takes a deep breath Respiratory: Normal respiratory effort.  No retractions. Lungs CTAB. Gastrointestinal: Soft and nontender even to deep palpation. No distention.  Musculoskeletal: Some tenderness to palpation of the thoracic spine although it is difficult to pinpoint a specific location.  No lumbar tenderness to palpation.  No lower extremity tenderness nor edema. No gross deformities of extremities in spite of the multiple subacute skin lacerations and bruising. Neurologic:  Normal speech and language. No gross focal neurologic deficits are appreciated.  Skin:  Skin is warm, dry, with numerous subacute traumatic ecchymoses and subacute skin avulsions but no evidence of any acute  injuries today.   ____________________________________________   LABS (all labs ordered are listed, but only abnormal results are displayed)  Labs Reviewed  CBC WITH DIFFERENTIAL/PLATELET - Abnormal; Notable for the following components:      Result Value   RBC 3.74 (*)    Hemoglobin 11.5 (*)    HCT 34.0 (*)    RDW 18.6 (*)    All other components within normal limits  TROPONIN I - Abnormal; Notable for the following components:   Troponin I 0.10 (*)    All other components within normal limits  COMPREHENSIVE METABOLIC PANEL - Abnormal; Notable for the following components:   Chloride 97 (*)    BUN 21 (*)    Creatinine, Ser 2.32 (*)    Calcium 8.6 (*)    Total Bilirubin 1.5 (*)    GFR calc non Af Amer 18 (*)    GFR calc Af Amer 20 (*)    All other components within normal limits  LIPASE, BLOOD - Abnormal; Notable for the following components:   Lipase 59 (*)    All other components within normal limits  LACTIC ACID, PLASMA  URINALYSIS, ROUTINE W REFLEX MICROSCOPIC  LACTIC ACID, PLASMA   ____________________________________________  EKG  ED ECG REPORT I, Hinda Kehr, the attending physician, personally viewed and interpreted this ECG.  Date: 06/16/2017 EKG Time: 18:26 Rate: 79 Rhythm: Atrial sensed ventricular paced rhythm tracking against P waves QRS Axis: Atrial sensed ventricular paced rhythm tracking against P waves Intervals: Atrial sensed ventricular paced rhythm tracking against P waves ST/T Wave abnormalities: Non-specific ST segment / T-wave changes, but no evidence of acute ischemia. Narrative Interpretation: no evidence of acute ischemia   ____________________________________________  RADIOLOGY   ED MD interpretation:  No acute abnormalities on imaging other than new frontal hematoma.  No fractures or intracranial bleeding.  Official radiology report(s): Ct  Head Wo Contrast  Result Date: 06/16/2017 CLINICAL DATA:  Fall with hematoma to  forehead. EXAM: CT HEAD WITHOUT CONTRAST CT CERVICAL SPINE WITHOUT CONTRAST TECHNIQUE: Multidetector CT imaging of the head and cervical spine was performed following the standard protocol without intravenous contrast. Multiplanar CT image reconstructions of the cervical spine were also generated. COMPARISON:  CT of the head on 06/11/2017 and CT of the cervical spine on 04/21/2017. FINDINGS: CT HEAD FINDINGS Brain: Stable small vessel disease in the periventricular white matter. The brain demonstrates no evidence of acute hemorrhage, infarction, edema, mass effect, extra-axial fluid collection, hydrocephalus or mass lesion. Vascular: No hyperdense vessel or unexpected calcification. Skull: Recent right parietal scalp hematoma is smaller in size. There is a new right and midline frontal scalp hematoma present. No underlying acute skull fracture is identified. Sinuses/Orbits: No acute finding. Other: None. CT CERVICAL SPINE FINDINGS Alignment: Normal alignment without subluxation. Skull base and vertebrae: No evidence of cervical spine fracture. No bony lesions or destruction identified. Soft tissues and spinal canal: No prevertebral soft tissue swelling. Disc levels: Stable degenerative disc disease at C5-6 and C6-7. Mild degenerative disc disease at C4-5. Upper chest: Right pleural fluid seen at the apex. IMPRESSION: 1. New right frontal and midline scalp hematoma without skull fracture or acute intracranial findings. The recent right parietal scalp hematoma is smaller in size. 2. No evidence of acute cervical spine injury. Stable degenerative disc disease. 3. Right-sided pleural effusion seen in the upper right hemithorax. Electronically Signed   By: Aletta Edouard M.D.   On: 06/16/2017 18:32   Ct Chest Wo Contrast  Result Date: 06/16/2017 CLINICAL DATA:  Status post multiple falls with right shoulder pain and scattered bruising. EXAM: CT CHEST WITHOUT CONTRAST TECHNIQUE: Multidetector CT imaging of the chest  was performed following the standard protocol without IV contrast. COMPARISON:  Chest CT dated 01/01/2017 FINDINGS: Cardiovascular: Enlarged heart. Biventricular pacemaker in place. No evidence of pericardial effusion. Calcific atherosclerotic disease of the coronary arteries and aorta. Mediastinum/Nodes: No enlarged mediastinal or axillary lymph nodes. Thyroid gland, trachea, and esophagus demonstrate no significant findings. Lungs/Pleura: Large layering right pleural effusion with compressive atelectasis of the right lower lobe and near complete collapse of the right middle lobe. Upper Abdomen: Atrophic kidneys.  Vascular calcifications. Musculoskeletal: There has been a prior side plate and screw fixation through distal right clavicular fracture. The screws are anchored within the distal fracture fragment and posttraumatic callus. There is stable deformity with significant angulation at the proximal fracture site. There is longitudinal linear lucency through the proximal right clavicle with scant overlying callus formation, which likely represents a nonunion fracture. No evidence of spinal rib fractures. There is a healing or healed sternal body fracture. IMPRESSION: Large layering right pleural effusion with compressive atelectasis of the right lower lobe and near complete collapse of the right middle lobe. Enlarged heart. Calcific atherosclerotic disease of the coronary arteries and aorta. Sideplate and screw fixation of prior comminuted right clavicular fracture with screws which are anchored to the distal fracture fragment and callus. Sharp angulation at the proximal fracture line, which appears stable from the radiograph dated 05/25/2017. Healing or healed sternal body fracture. No evidence of acute injury. Aortic Atherosclerosis (ICD10-I70.0). Electronically Signed   By: Fidela Salisbury M.D.   On: 06/16/2017 18:43   Ct Cervical Spine Wo Contrast  Result Date: 06/16/2017 CLINICAL DATA:  Fall with  hematoma to forehead. EXAM: CT HEAD WITHOUT CONTRAST CT CERVICAL SPINE WITHOUT CONTRAST TECHNIQUE: Multidetector CT imaging of the  head and cervical spine was performed following the standard protocol without intravenous contrast. Multiplanar CT image reconstructions of the cervical spine were also generated. COMPARISON:  CT of the head on 06/11/2017 and CT of the cervical spine on 04/21/2017. FINDINGS: CT HEAD FINDINGS Brain: Stable small vessel disease in the periventricular white matter. The brain demonstrates no evidence of acute hemorrhage, infarction, edema, mass effect, extra-axial fluid collection, hydrocephalus or mass lesion. Vascular: No hyperdense vessel or unexpected calcification. Skull: Recent right parietal scalp hematoma is smaller in size. There is a new right and midline frontal scalp hematoma present. No underlying acute skull fracture is identified. Sinuses/Orbits: No acute finding. Other: None. CT CERVICAL SPINE FINDINGS Alignment: Normal alignment without subluxation. Skull base and vertebrae: No evidence of cervical spine fracture. No bony lesions or destruction identified. Soft tissues and spinal canal: No prevertebral soft tissue swelling. Disc levels: Stable degenerative disc disease at C5-6 and C6-7. Mild degenerative disc disease at C4-5. Upper chest: Right pleural fluid seen at the apex. IMPRESSION: 1. New right frontal and midline scalp hematoma without skull fracture or acute intracranial findings. The recent right parietal scalp hematoma is smaller in size. 2. No evidence of acute cervical spine injury. Stable degenerative disc disease. 3. Right-sided pleural effusion seen in the upper right hemithorax. Electronically Signed   By: Aletta Edouard M.D.   On: 06/16/2017 18:32   Ct T-spine No Charge  Result Date: 06/16/2017 CLINICAL DATA:  Fall today. EXAM: CT THORACIC SPINE WITHOUT CONTRAST TECHNIQUE: Multidetector CT images of the thoracic were obtained using the standard protocol  without intravenous contrast. COMPARISON:  None. FINDINGS: Alignment: The thoracic spine shows normal alignment. Vertebrae: No evidence of fracture.  No bony lesions. Paraspinal and other soft tissues: No paravertebral soft tissue abnormalities. Disc levels: Minimal degenerative disc disease and osteophyte formation. Moderate to large right pleural effusion present. IMPRESSION: No evidence of acute thoracic spine injury. Moderate to large right pleural effusion. Electronically Signed   By: Aletta Edouard M.D.   On: 06/16/2017 18:36    ____________________________________________   PROCEDURES  Critical Care performed: No   Procedure(s) performed:   Procedures   ____________________________________________   INITIAL IMPRESSION / ASSESSMENT AND PLAN / ED COURSE  As part of my medical decision making, I reviewed the following data within the [place] notes reviewed and incorporated, Labs reviewed , EKG interpreted , Old chart reviewed and Notes from prior ED visits    Differential diagnosis includes, but is not limited to, mechanical fall, muscle strain and contusion, rib fracture, sternal fracture, vertebral fracture, worsening pleural effusion and/or pneumonia, delirium in the setting of an acute infectious process or traumatic injury, chronic dementia, etc.  The patient is in no distress although she is in pain when moving around or taking a deep breath.  She is not able to provide a concise or accurate history and I suspect she has undocumented age-related dementia on top of possible acute medical issues.  I will evaluate thoroughly with lab work-up including a lactic acid given her admission within the last few weeks for pneumonia, CT scans of the head and cervical spine as well as of the chest to carefully evaluate not only for the pleural effusion the possibility of an unresolved pneumonia but also for rib fractures with thoracic spine recon.  We will monitor  her carefully and reassess after some results are back.   Clinical Course as of Jun 16 2144  Wed Jun 16, 2017  1831  Troponin is at her baseline (always in the 0.1 range).  Troponin I(!!): 0.10 [CF]  1831 Lactic Acid, Venous: 1.4 [CF]  1831 CBC is reassuring  CBC with Differential/Platelet(!) [CF]  1831 No unanticipated abnormalities on CMP  Comprehensive metabolic panel(!) [CF]  8921 The patient has remained confused and upset intermittently but in no acute distress.  I had the opportunity to speak at the bedside with her brother as well as on the phone with her sister-in-law.  They both agreed that she suffers from dementia that is not officially been diagnosed.  I explained the reassuring medical and trauma work-up today and that there are no acute abnormalities, just a number of chronic abnormalities of which we are already aware, such as the pleural effusion and the chronic fracture to her clavicle.  I explained that there is no evidence of any acute injury from her fall and no evidence of an infectious process.  We did not check urine since she is on hemodialysis.  She is afebrile, her heart rate is paced, and she is in no respiratory distress in spite of the pleural effusion and she has a consistently appropriate SPO2 from 98 to 100%.   [CF]  2145 The brother is comfortable with her going back to [employer]; although he is not completely satisfied with the circumstances, he understands that there is no medical indication for admission at this time.  I also expressed my concern that if she is brought into the hospital even for observation with nothing acute to treat, she will likely deteriorate due to delirium induced by being in the hospital and away from her usual environment.  He states he understands and agrees.  There is no indication for any change of her medications.  She needs to follow-up with her primary care provider at the next available opportunity.  I gave my usual and customary  return precautions.    [CF]    Clinical Course User Index [CF] Hinda Kehr, MD    ____________________________________________  FINAL CLINICAL IMPRESSION(S) / ED DIAGNOSES  Final diagnoses:  Pain  Chronic pleural effusion  Fall, initial encounter  Elevated troponin I level  Elevated lipase     MEDICATIONS GIVEN DURING THIS VISIT:  Medications - No data to display   ED Discharge Orders    None       Note:  This document was prepared using Dragon voice recognition software and may include unintentional dictation errors.    Hinda Kehr, MD 06/16/17 2146

## 2017-06-16 NOTE — Discharge Instructions (Signed)
You have been seen in the Emergency Department (ED) today for falls.  Your work up does not show any concerning injuries.  You still have pleural effusion (fluid around your lung) that was evaluated on your previous hospitalization, but all of your CT scans and lab work did not show anything else new or different.  Please continue with your regular medications and follow-up with your doctor at the next available opportunity.  Please follow up with your doctor regarding today's Emergency Department (ED) visit and your recent fall.    Return to the ED if you have any headache, confusion, slurred speech, weakness/numbness of any arm or leg, or any increased pain.

## 2017-06-18 ENCOUNTER — Telehealth (INDEPENDENT_AMBULATORY_CARE_PROVIDER_SITE_OTHER): Payer: Self-pay

## 2017-06-18 NOTE — Telephone Encounter (Signed)
Sharon [name], nurse at [facility], called and requested that the patient's appointment for 06/22/2017 be canceled. She did not give a reason.  Please cancel the appointment per Vicente Males.

## 2017-06-20 ENCOUNTER — Emergency Department: Payer: Medicare Other

## 2017-06-20 ENCOUNTER — Emergency Department
Admission: EM | Admit: 2017-06-20 | Discharge: 2017-06-20 | Disposition: A | Discharge status: Skilled Nursing Facility | Payer: Medicare Other | Attending: Emergency Medicine | Admitting: Emergency Medicine

## 2017-06-20 ENCOUNTER — Other Ambulatory Visit: Payer: Self-pay

## 2017-06-20 DIAGNOSIS — S0083XA Contusion of other part of head, initial encounter: Secondary | ICD-10-CM

## 2017-06-20 DIAGNOSIS — Y999 Unspecified external cause status: Secondary | ICD-10-CM | POA: Diagnosis not present

## 2017-06-20 DIAGNOSIS — Z992 Dependence on renal dialysis: Secondary | ICD-10-CM | POA: Insufficient documentation

## 2017-06-20 DIAGNOSIS — N186 End stage renal disease: Secondary | ICD-10-CM | POA: Diagnosis not present

## 2017-06-20 DIAGNOSIS — W050XXA Fall from non-moving wheelchair, initial encounter: Secondary | ICD-10-CM | POA: Diagnosis not present

## 2017-06-20 DIAGNOSIS — Y929 Unspecified place or not applicable: Secondary | ICD-10-CM | POA: Diagnosis not present

## 2017-06-20 DIAGNOSIS — I132 Hypertensive heart and chronic kidney disease with heart failure and with stage 5 chronic kidney disease, or end stage renal disease: Secondary | ICD-10-CM | POA: Diagnosis not present

## 2017-06-20 DIAGNOSIS — Z79899 Other long term (current) drug therapy: Secondary | ICD-10-CM | POA: Insufficient documentation

## 2017-06-20 DIAGNOSIS — I5022 Chronic systolic (congestive) heart failure: Secondary | ICD-10-CM | POA: Insufficient documentation

## 2017-06-20 DIAGNOSIS — T148XXA Other injury of unspecified body region, initial encounter: Secondary | ICD-10-CM | POA: Diagnosis not present

## 2017-06-20 DIAGNOSIS — S0990XA Unspecified injury of head, initial encounter: Secondary | ICD-10-CM

## 2017-06-20 DIAGNOSIS — Y939 Activity, unspecified: Secondary | ICD-10-CM | POA: Insufficient documentation

## 2017-06-20 DIAGNOSIS — I2510 Atherosclerotic heart disease of native coronary artery without angina pectoris: Secondary | ICD-10-CM | POA: Diagnosis not present

## 2017-06-20 DIAGNOSIS — Z87891 Personal history of nicotine dependence: Secondary | ICD-10-CM | POA: Insufficient documentation

## 2017-06-20 DIAGNOSIS — Z950 Presence of cardiac pacemaker: Secondary | ICD-10-CM | POA: Insufficient documentation

## 2017-06-20 DIAGNOSIS — I252 Old myocardial infarction: Secondary | ICD-10-CM | POA: Insufficient documentation

## 2017-06-20 NOTE — ED Notes (Signed)
Report called to Manuela Schwartz, Therapist, sports at [facility].

## 2017-06-20 NOTE — ED Notes (Signed)
Called for Adult [employer]  [phone]

## 2017-06-20 NOTE — ED Notes (Signed)
Pt has numerous skin tears and bruising over her upper extremities, bruising over her shoulders, face and head. Has had numerous falls over the last few days. Tonight pt feel forward out of the wheelchair at the nursing home.

## 2017-06-20 NOTE — ED Notes (Signed)
Pt to CT

## 2017-06-20 NOTE — ED Triage Notes (Signed)
Pt presents via [facility] for fall from [facility]. Pt presents for generalized pain and new fall. Pt was seen here on Friday for fall as well. Pt has pacemaker. Pt has generalized bruising from chronic falls.

## 2017-06-20 NOTE — ED Provider Notes (Signed)
[facility] Emergency Department Provider Note  ____________________________________________   I have reviewed the triage vital signs and the nursing notes. Where available I have reviewed prior notes and, if possible and indicated, outside hospital notes.    HISTORY  Chief Complaint Fall    HPI Sharon [name] is a 84 y.o. female with extensive medical history including dialysis, frequent falls, she presents today after another fall.  She was just seen here and evaluated for a fall.  Patient apparently falls out of her wheelchair on a regular basis.  As the story today is that she fell out of her wheelchair.  Non-syncopal fall.  She does get dialysis, she is DNR, had dialysis yesterday.  At her baseline according to all reports. Level 5 chart caveat; no further history available due to patient status.  Patient does have known interiors to her arms from prior falls, bruising to her entire face from prior falls, apparently, at the facility where she states they are having great deal of difficulty stopping her from hitting the ground.   Past Medical History:  Diagnosis Date  . Anemia   . Arthritis   . CAD (coronary artery disease)   . Cancer ([place])    skin  . Cardiomyopathy ([place])   . CHF (congestive heart failure) ([place])   . Depression   . IBS (irritable bowel syndrome) 2010  . Kidney failure July 2012   Hemodialysis 3xweek  . Kidney failure   . Meniere disease   . Meniere's disease   . Myocardial infarction ([place])   . OSA (obstructive sleep apnea)    CPAP  . Peritoneal dialysis status ([place])   . Presence of IVC filter 2019  . Presence of permanent cardiac pacemaker   . Renal insufficiency     Patient Active Problem List   Diagnosis Date Noted  . Swelling of limb 06/08/2017  . C. difficile diarrhea   . Palliative care encounter   . Encounter for hospice care discussion   . Protein-calorie malnutrition, severe 05/25/2017  . Pneumonia 05/24/2017  .  Complication from renal dialysis device 04/01/2017  . Displaced fracture of lateral end of right clavicle, initial encounter for closed fracture 02/16/2017  . FTT (failure to thrive) in adult 02/03/2017  . ESRD on hemodialysis ([place])   . Pleural effusion   . Palliative care by specialist   . Pulmonary embolism ([place]) 01/02/2017  . Syncope 01/01/2017  . Vision changes 08/27/2016  . Closed fracture of neck of femur ([place]) 05/26/2016  . Hip fracture ([place]) 05/10/2016  . Falls 04/27/2016  . Head injury 04/27/2016  . Age-related osteoporosis with current pathological fracture with routine healing 04/24/2016  . Recurrent falls 04/24/2016  . Mild protein-calorie malnutrition ([place]) 04/24/2016  . ESRD on dialysis ([place]) 04/10/2016  . Periprosthetic fracture around internal prosthetic left knee joint 01/26/2016  . Pressure injury of skin 12/15/2015  . Closed left subtrochanteric femur fracture ([place]) 12/15/2015  . Femur fracture, left ([place]) 12/14/2015  . Meniere disease   . Loss of weight 10/21/2015  . Clinical depression 06/20/2015  . Dysphagia, unspecified 05/24/2015  . Imbalance 04/22/2015  . Chronic pain syndrome 05/22/2014  . Insomnia 03/13/2014  . Anxiety state 03/13/2014  . Chronic systolic CHF (congestive heart failure), NYHA class 3 ([place]) 01/15/2014  . OSA (obstructive sleep apnea) 01/15/2014  . Basal cell carcinoma of neck 11/14/2013  . DDD (degenerative disc disease), cervical 11/07/2013  . Cervical radiculitis 10/16/2013  . Benign essential hypertension 09/12/2013  .  Memory loss 09/12/2013  . Systolic heart failure, chronic ([place]) 05/12/2013  . Goals of care, counseling/discussion 11/10/2012  . Sinus node dysfunction ([place]) 08/01/2012  . Low back pain 08/01/2012  . Cervical spine pain 05/02/2012  . Irritable bowel syndrome 11/02/2011  . Depression 04/01/2011  . Hypertension 04/01/2011    Past Surgical History:  Procedure Laterality Date  . [place]   . ABDOMINAL HYSTERECTOMY    . ANUS SURGERY  2010  . AV FISTULA PLACEMENT Right 04/15/2016   Procedure: ARTERIOVENOUS (AV) FISTULA CREATION ( RADIOCEPHALIC ) STAGE 2;  Surgeon: Algernon Huxley, MD;  Location: [facility];  Service: Vascular;  Laterality: Right;  . CATARACT EXTRACTION  2006, 2011  . CHOLECYSTECTOMY  01/2008  . DIALYSIS/PERMA CATHETER INSERTION N/A 01/07/2017   Procedure: DIALYSIS/PERMA CATHETER INSERTION With an IVC filter placement;  Surgeon: Algernon Huxley, MD;  Location: [place] [facility];  Service: Cardiovascular;  Laterality: N/A;  . DIALYSIS/PERMA CATHETER REMOVAL N/A 08/20/2016   Procedure: Dialysis/Perma Catheter Removal;  Surgeon: Algernon Huxley, MD;  Location: [place] [facility];  Service: Cardiovascular;  Laterality: N/A;  . EYE SURGERY     cataracts bilateral  . FEMUR IM NAIL Left 12/15/2015   Procedure: INTRAMEDULLARY (IM) RETROGRADE FEMORAL NAILING;  Surgeon: Oletta Cohn, DO;  Location: [facility];  Service: Orthopedics;  Laterality: Left;  . HIP PINNING,CANNULATED Left 05/10/2016   Procedure: CANNULATED HIP PINNING;  Surgeon: Earnestine Leys, MD;  Location: [facility];  Service: Orthopedics;  Laterality: Left;  . INSERT / REPLACE / REMOVE PACEMAKER    . INSERTION OF DIALYSIS CATHETER  07/2010  . IVC FILTER INSERTION  2019  . JOINT REPLACEMENT     left knee  . MINOR REMOVAL OF PERITONEAL DIALYSIS CATHETER  04/15/2016   Procedure: MINOR REMOVAL OF PERITONEAL DIALYSIS CATHETER;  Surgeon: Algernon Huxley, MD;  Location: [facility];  Service: Vascular;;  . ORIF CLAVICULAR FRACTURE Right 02/16/2017   Procedure: OPEN REDUCTION INTERNAL FIXATION (ORIF) CLAVICULAR FRACTURE;  Surgeon: Corky Mull, MD;  Location: [facility];  Service: Orthopedics;  Laterality: Right;  . PACEMAKER INSERTION  2006  . PERIPHERAL VASCULAR CATHETERIZATION N/A 12/18/2015   Procedure: Dialysis/Perma Catheter Insertion;  Surgeon: Katha Cabal, MD;  Location: [place] [facility];  Service: Cardiovascular;   Laterality: N/A;  . TOTAL KNEE ARTHROPLASTY  2008   LEFT/Dr [place]    Prior to Admission medications   Medication Sig Start Date End Date Taking? Authorizing Provider  acetaminophen (TYLENOL) 325 MG tablet Take 2 tablets (650 mg total) by mouth every 4 (four) hours as needed for mild pain or moderate pain. 05/14/16   Loletha Grayer, MD  benzonatate (TESSALON) 200 MG capsule Take 1 capsule (200 mg total) by mouth 3 (three) times daily. 06/01/17   Dustin Flock, MD  guaiFENesin-dextromethorphan (ROBITUSSIN DM) 100-10 MG/5ML syrup Take 5 mLs by mouth every 4 (four) hours as needed for cough. 06/01/17   Dustin Flock, MD  hydrocortisone 2.5 % cream Apply topically 3 (three) times daily.    [provider]  levothyroxine (SYNTHROID, LEVOTHROID) 25 MCG tablet Take 1 tablet (25 mcg total) by mouth daily. 02/04/17   Bettey Costa, MD  metoprolol succinate (TOPROL-XL) 25 MG 24 hr tablet Take 1 tablet (25 mg total) by mouth daily. 06/01/17   Dustin Flock, MD  multivitamin (RENA-VIT) TABS tablet Take 1 tablet by mouth daily.    [provider]  sertraline (ZOLOFT) 100 MG tablet Take 150 mg  by mouth at bedtime.     [provider]  sevelamer carbonate (RENVELA) 800 MG tablet Take 1 tablet (800 mg total) by mouth 3 (three) times daily with meals. 06/01/17   Dustin Flock, MD  traMADol (ULTRAM) 50 MG tablet Take 1-2 tablets (50-100 mg total) by mouth every 6 (six) hours as needed for moderate pain. Patient taking differently: Take 50-100 mg by mouth every 6 (six) hours as needed (for mild to moderate pain).  06/01/17   Dustin Flock, MD  Vitamin D, Ergocalciferol, (DRISDOL) 50000 units CAPS capsule Take 50,000 Units by mouth every Monday.  04/17/15   [provider]    Allergies Statins; Codeine sulfate; Codeine; Effexor [venlafaxine]; and Ezetimibe-simvastatin  Family History  Problem Relation Age of Onset  . Cancer Mother        ? ovarian - sarcoma  . Cancer Father         Skin cancer  . Diabetes Sister   . COPD Sister   . Depression Sister   . Cancer Sister        Lung - 68 yrs old    Social History Social History   Tobacco Use  . Smoking status: Former Smoker    Last attempt to quit: 03/31/1948    Years since quitting: 69.2  . Smokeless tobacco: Never Used  Substance Use Topics  . Alcohol use: No  . Drug use: No    Review of Systems Level 5 chart caveat; no further history available due to patient status.   ____________________________________________   PHYSICAL EXAM:  VITAL SIGNS: ED Triage Vitals  Enc Vitals Group     BP 06/20/17 1930 139/67     Pulse Rate 06/20/17 1900 65     Resp 06/20/17 1900 10     Temp 06/20/17 1900 97.9 F (36.6 C)     Temp Source 06/20/17 1900 Oral     SpO2 06/20/17 1900 100 %     Weight 06/20/17 1901 104 lb (47.2 kg)     Height 06/20/17 1901 5\' 4"  (1.626 m)     Head Circumference --      Peak Flow --      Pain Score 06/20/17 1900 9     Pain Loc --      Pain Edu? --      Excl. in [place]? --     Constitutional: Awake and confused, at baseline according to prior notes. Eyes: Conjunctivae are normal Head:  Extensive Extensive bruising with bruising tracking down her face from old injuries, hard to tell what is new and old given multiple head injuries. HEENT: No congestion/rhinnorhea. Mucous membranes are moist.  Oropharynx non-erythematous Neck:   Nontender with no meningismus, no masses, no stridor Cardiovascular: Normal rate, regular rhythm. Grossly normal heart sounds.  Good peripheral circulation. Respiratory: Normal respiratory effort.  No retractions. Lungs CTAB. Abdominal: Soft and nontender. No distention. No guarding no rebound Back:  There is no focal tenderness or step off.  there is no midline tenderness there are no lesions noted. there is no CVA tenderness Musculoskeletal: No lower extremity tenderness, no upper extremity tenderness. No joint effusions, no DVT signs strong distal pulses  no edema can range hips without evidence of pain Neurologic: limited exam no obvious focality Skin:  Skin is warm, dry  Multiple skin tears in different stages of healing, none appear to be infected.    ____________________________________________   LABS (all labs ordered are listed, but only abnormal results are displayed)  Labs  Reviewed - No data to display  Pertinent labs  results that were available during my care of the patient were reviewed by me and considered in my medical decision making (see chart for details). ____________________________________________  EKG  I personally interpreted any EKGs ordered by me or triage  ____________________________________________  RADIOLOGY  Pertinent labs & imaging results that were available during my care of the patient were reviewed by me and considered in my medical decision making (see chart for details). If possible, patient and/or family made aware of any abnormal findings.  Ct Head Wo Contrast  Result Date: 06/20/2017 CLINICAL DATA:  Several recent falls EXAM: CT HEAD WITHOUT CONTRAST CT CERVICAL SPINE WITHOUT CONTRAST TECHNIQUE: Multidetector CT imaging of the head and cervical spine was performed following the standard protocol without intravenous contrast. Multiplanar CT image reconstructions of the cervical spine were also generated. COMPARISON:  CT head and CT cervical spine Jun 16, 2017 FINDINGS: CT HEAD FINDINGS Brain: Mild diffuse atrophy is stable. There is no intracranial mass, hemorrhage, extra-axial fluid collection, or midline shift. There is patchy small vessel disease throughout the centra semiovale bilaterally, stable. No evident acute infarct. Vascular: No hyperdense vessel. There is calcification in each distal vertebral artery and in each carotid siphon. Skull: Scalp hematomas along the right frontal and temporal regions are again noted. Scalp hematoma to the left of midline in the frontal region is smaller compared to  recent study. Bony calvarium appears intact. Sinuses/Orbits: There is mucosal thickening in several ethmoid air cells. Other visualized paranasal sinuses are clear. Visualized orbits appear symmetric bilaterally. Other: Mastoid air cells are clear. CT CERVICAL SPINE FINDINGS Alignment: There is 1 mm of anterolisthesis of C3 on C4. There is 1 mm of anterolisthesis of C4 on C5. There is 1 mm of retrolisthesis of C5 on C6. There is 2 mm of retrolisthesis of C6 on C7. Skull base and vertebrae: The skull base and craniocervical junction regions appear normal. Bones are osteoporotic. No fracture is evident. No blastic or lytic bone lesions are evident. Soft tissues and spinal canal: Prevertebral soft tissues and predental space regions are normal. No paraspinous lesion. No evident cord or canal hematoma. Disc levels: There is moderately severe disc space narrowing at C5-6 and C6-7. There is slightly milder disc space narrowing at C4-5. There is facet osteoarthritic change at multiple levels bilaterally. There is exit foraminal narrowing on the left due to bony hypertrophy with impression on the exiting nerve root due to bony hypertrophy at this level. Similar changes are noted at C5-6 bilaterally and at C6-7 bilaterally. No frank disc extrusion or high-grade stenosis evident. Upper chest: There is a sizable free-flowing pleural effusion on the right. Upper lung zones elsewhere clear. Other: There is calcification in each subclavian and carotid artery. There is a nodular lesion in the right lobe of the thyroid measuring 1.5 x 1.5 cm. IMPRESSION: CT head: Stable atrophy with supratentorial small vessel disease. No acute infarct. No mass or hemorrhage. Scalp hematomas are stable on the right and significantly smaller to the left of midline in the frontal region. No fracture evident. Foci of arterial vascular calcification noted. Mucosal thickening noted in several ethmoid air cells. CT cervical spine: No fracture. Areas of  mild spondylolisthesis appear due to underlying spondylosis. There is multilevel arthropathy. Sizable free-flowing pleural effusion on the right. Foci of vascular calcification in subclavian and carotid arteries bilaterally. **An incidental finding of potential clinical significance has been found. 1.5 x 1.5 cm right thyroid nodular lesion.  Consider further evaluation with thyroid ultrasound nonemergently. If patient is clinically hyperthyroid, consider nuclear medicine thyroid uptake and scan.** Electronically Signed   By: Lowella Grip III M.D.   On: 06/20/2017 19:57   Ct Cervical Spine Wo Contrast  Result Date: 06/20/2017 CLINICAL DATA:  Several recent falls EXAM: CT HEAD WITHOUT CONTRAST CT CERVICAL SPINE WITHOUT CONTRAST TECHNIQUE: Multidetector CT imaging of the head and cervical spine was performed following the standard protocol without intravenous contrast. Multiplanar CT image reconstructions of the cervical spine were also generated. COMPARISON:  CT head and CT cervical spine Jun 16, 2017 FINDINGS: CT HEAD FINDINGS Brain: Mild diffuse atrophy is stable. There is no intracranial mass, hemorrhage, extra-axial fluid collection, or midline shift. There is patchy small vessel disease throughout the centra semiovale bilaterally, stable. No evident acute infarct. Vascular: No hyperdense vessel. There is calcification in each distal vertebral artery and in each carotid siphon. Skull: Scalp hematomas along the right frontal and temporal regions are again noted. Scalp hematoma to the left of midline in the frontal region is smaller compared to recent study. Bony calvarium appears intact. Sinuses/Orbits: There is mucosal thickening in several ethmoid air cells. Other visualized paranasal sinuses are clear. Visualized orbits appear symmetric bilaterally. Other: Mastoid air cells are clear. CT CERVICAL SPINE FINDINGS Alignment: There is 1 mm of anterolisthesis of C3 on C4. There is 1 mm of anterolisthesis of  C4 on C5. There is 1 mm of retrolisthesis of C5 on C6. There is 2 mm of retrolisthesis of C6 on C7. Skull base and vertebrae: The skull base and craniocervical junction regions appear normal. Bones are osteoporotic. No fracture is evident. No blastic or lytic bone lesions are evident. Soft tissues and spinal canal: Prevertebral soft tissues and predental space regions are normal. No paraspinous lesion. No evident cord or canal hematoma. Disc levels: There is moderately severe disc space narrowing at C5-6 and C6-7. There is slightly milder disc space narrowing at C4-5. There is facet osteoarthritic change at multiple levels bilaterally. There is exit foraminal narrowing on the left due to bony hypertrophy with impression on the exiting nerve root due to bony hypertrophy at this level. Similar changes are noted at C5-6 bilaterally and at C6-7 bilaterally. No frank disc extrusion or high-grade stenosis evident. Upper chest: There is a sizable free-flowing pleural effusion on the right. Upper lung zones elsewhere clear. Other: There is calcification in each subclavian and carotid artery. There is a nodular lesion in the right lobe of the thyroid measuring 1.5 x 1.5 cm. IMPRESSION: CT head: Stable atrophy with supratentorial small vessel disease. No acute infarct. No mass or hemorrhage. Scalp hematomas are stable on the right and significantly smaller to the left of midline in the frontal region. No fracture evident. Foci of arterial vascular calcification noted. Mucosal thickening noted in several ethmoid air cells. CT cervical spine: No fracture. Areas of mild spondylolisthesis appear due to underlying spondylosis. There is multilevel arthropathy. Sizable free-flowing pleural effusion on the right. Foci of vascular calcification in subclavian and carotid arteries bilaterally. **An incidental finding of potential clinical significance has been found. 1.5 x 1.5 cm right thyroid nodular lesion. Consider further evaluation  with thyroid ultrasound nonemergently. If patient is clinically hyperthyroid, consider nuclear medicine thyroid uptake and scan.** Electronically Signed   By: Lowella Grip III M.D.   On: 06/20/2017 19:57   ____________________________________________    PROCEDURES  Procedure(s) performed: None  Procedures  Critical Care performed: None  ____________________________________________   INITIAL IMPRESSION /  ASSESSMENT AND PLAN / ED COURSE  Pertinent labs & imaging results that were available during my care of the patient were reviewed by me and considered in my medical decision making (see chart for details).  Patient with frequent recurrent falls I do not see any evidence of bony injury, she is bruised so many different places it is hard to tell what is acute but no evidence of acute bony injury or infection she has multiple chronic skin tears which I cannot repair.  I did discuss with her power of attorney, who feels that she is at her mental baseline and I agree.  He is frustrated with her frequent falls.  Requested the nurse call Adult Protective Services just because of the frequency of fall, this does not imply that there is full involvement perhaps something could be changed that would help.  POA made aware of thyroid nodule which I think is the least of her worries at this time.  Patient is in no acute distress, there is no evidence that she had any acute trauma from this actual fall.  We will discharge her back to facility    ____________________________________________   FINAL CLINICAL IMPRESSION(S) / ED DIAGNOSES  Final diagnoses:  None      This chart was dictated using voice recognition software.  Despite best efforts to proofread,  errors can occur which can change meaning.      Schuyler Amor, MD 06/20/17 [id number]

## 2017-06-20 NOTE — ED Notes (Signed)
Called [facility] for transport to [facility]   [phone]

## 2017-06-21 MED ORDER — CEFAZOLIN SODIUM-DEXTROSE 1-4 GM/50ML-% IV SOLN: 1.0000 g | Freq: Once | INTRAVENOUS | Status: DC

## 2017-06-22 ENCOUNTER — Ambulatory Visit: Admission: RE | Admit: 2017-06-22 | Payer: Medicare Other | Source: Ambulatory Visit | Admitting: Vascular Surgery

## 2017-06-22 SURGERY — DIALYSIS/PERMA CATHETER REMOVAL
Anesthesia: LOCAL

## 2017-06-24 ENCOUNTER — Ambulatory Visit (INDEPENDENT_AMBULATORY_CARE_PROVIDER_SITE_OTHER): Payer: Medicare Other | Admitting: Internal Medicine

## 2017-06-24 ENCOUNTER — Encounter: Payer: Self-pay | Admitting: Internal Medicine

## 2017-06-24 ENCOUNTER — Telehealth: Payer: Self-pay | Admitting: Internal Medicine

## 2017-06-24 VITALS — BP 128/70 | HR 75 | Resp 16 | Ht 64.0 in

## 2017-06-24 DIAGNOSIS — I2782 Chronic pulmonary embolism: Secondary | ICD-10-CM | POA: Diagnosis not present

## 2017-06-24 DIAGNOSIS — G4733 Obstructive sleep apnea (adult) (pediatric): Secondary | ICD-10-CM

## 2017-06-24 DIAGNOSIS — G4710 Hypersomnia, unspecified: Secondary | ICD-10-CM | POA: Diagnosis not present

## 2017-06-24 DIAGNOSIS — G4719 Other hypersomnia: Secondary | ICD-10-CM | POA: Diagnosis not present

## 2017-06-24 DIAGNOSIS — S42001S Fracture of unspecified part of right clavicle, sequela: Secondary | ICD-10-CM

## 2017-06-24 NOTE — Progress Notes (Signed)
[name]      Assessment and Plan:  Severe excessive daytime sleepiness with severe OSA. Multiple falls and hospitalizations due to numerous falls and fractures.  Patient remains at high risk of multiple falls, hospitalization and death due to severe daytime sleepiness.  Cannot rule out narcolepsy or idiopathic hypersomnia.  The patient is on multiple medications though none that should be causing this degree of sleepiness. - We will send for stat split-night sleep study.  Right clavicle fracture, Right facial contusion/bruising, right periorbital edema. -Above appear to be secondary to recent fall due to hypersomnia.  History of pulmonary embolism. - Developed bleeding complications on anticoagulation, currently off of anticoagulation, patient has an IVC filter.  End-stage renal disease.  On hemodialysis. - Dad contributing to excessive daytime sleepiness.  Date: 06/24/2017  MRN# [id number] Sharon [name] 01/29/1926  Referring Physician: Assisted living.   Sharon [name] is a 84 y.o. old female seen in consultation for chief complaint of:    Chief Complaint  Patient presents with  . Consult    Pt was on cpap in the home. She stays sleepy and falls. Her brother states a cpap was delivered yesterday, he is unsure of  who ordered cpap for [facility].    HPI:   Pt is here with her brother who gives history, the patient is very hard of hearing and can not answer questions.  she has a history of anemia, coronary artery disease, cardiomyopathy ejection fraction 20%, obstructive sleep apnea, end-stage renal disease on hemodialysis. Patient has a history of of pulmonary embolism, had bleeding complications with anticoagulation, subsequently status post IVC filter in December 2018.   The patient has had multiple admissions, this year, for multiple falls, as well as pneumonia. She currently resides in assisted living facility it is been noticed that  she falls asleep repetitively doing routine activities.She is extremely sleepy at her assisted living, she falls asleep spontaneously at any time. She will be sitting in a chair and then fall out of it, and has subsequently experienced injuries due to this.  She falls asleep in her plate of food. At her living facility apparently it is not allowed for her to be restrained to prevent her from falling out. Brother estimates that she has fallen more than a dozen times in the past few months.     It was noted that she has a history of obstructive sleep apnea in the past, therefore a CPAP was ordered, apparently one was delivered to the assisted living facility today and the patient is supposed to be started on it.  Uncertain of who has ordered or who is managing this, her Epworth is 24. During conversation with the patient's brother the patient slumped over in her wheelchair numerous times from falling asleep.  She was last on CPAP about 2 years ago, she was on PD at that and then she did not want to do both, stopped CPAP, she never restarted.  Uncertain of where her last sleep study was performed.   PMHX:   Past Medical History:  Diagnosis Date  . Anemia   . Arthritis   . CAD (coronary artery disease)   . Cancer ([place])    skin  . Cardiomyopathy ([place])   . CHF (congestive heart failure) ([place])   . Depression   . IBS (irritable bowel syndrome) 2010  . Kidney failure July 2012   Hemodialysis 3xweek  . Kidney failure   . Meniere disease   .  Meniere's disease   . Myocardial infarction ([place])   . OSA (obstructive sleep apnea)    CPAP  . Peritoneal dialysis status ([place])   . Presence of IVC filter 2019  . Presence of permanent cardiac pacemaker   . Renal insufficiency    Surgical Hx:  Past Surgical History:  Procedure Laterality Date  . [place]  . ABDOMINAL HYSTERECTOMY    . ANUS SURGERY  2010  . AV FISTULA PLACEMENT Right 04/15/2016   Procedure: ARTERIOVENOUS (AV)  FISTULA CREATION ( RADIOCEPHALIC ) STAGE 2;  Surgeon: Algernon Huxley, MD;  Location: [facility];  Service: Vascular;  Laterality: Right;  . CATARACT EXTRACTION  2006, 2011  . CHOLECYSTECTOMY  01/2008  . DIALYSIS/PERMA CATHETER INSERTION N/A 01/07/2017   Procedure: DIALYSIS/PERMA CATHETER INSERTION With an IVC filter placement;  Surgeon: Algernon Huxley, MD;  Location: [place] [facility];  Service: Cardiovascular;  Laterality: N/A;  . DIALYSIS/PERMA CATHETER REMOVAL N/A 08/20/2016   Procedure: Dialysis/Perma Catheter Removal;  Surgeon: Algernon Huxley, MD;  Location: [place] [facility];  Service: Cardiovascular;  Laterality: N/A;  . EYE SURGERY     cataracts bilateral  . FEMUR IM NAIL Left 12/15/2015   Procedure: INTRAMEDULLARY (IM) RETROGRADE FEMORAL NAILING;  Surgeon: Oletta Cohn, DO;  Location: [facility];  Service: Orthopedics;  Laterality: Left;  . HIP PINNING,CANNULATED Left 05/10/2016   Procedure: CANNULATED HIP PINNING;  Surgeon: Earnestine Leys, MD;  Location: [facility];  Service: Orthopedics;  Laterality: Left;  . INSERT / REPLACE / REMOVE PACEMAKER    . INSERTION OF DIALYSIS CATHETER  07/2010  . IVC FILTER INSERTION  2019  . JOINT REPLACEMENT     left knee  . MINOR REMOVAL OF PERITONEAL DIALYSIS CATHETER  04/15/2016   Procedure: MINOR REMOVAL OF PERITONEAL DIALYSIS CATHETER;  Surgeon: Algernon Huxley, MD;  Location: [facility];  Service: Vascular;;  . ORIF CLAVICULAR FRACTURE Right 02/16/2017   Procedure: OPEN REDUCTION INTERNAL FIXATION (ORIF) CLAVICULAR FRACTURE;  Surgeon: Corky Mull, MD;  Location: [facility];  Service: Orthopedics;  Laterality: Right;  . PACEMAKER INSERTION  2006  . PERIPHERAL VASCULAR CATHETERIZATION N/A 12/18/2015   Procedure: Dialysis/Perma Catheter Insertion;  Surgeon: Katha Cabal, MD;  Location: [place] [facility];  Service: Cardiovascular;  Laterality: N/A;  . TOTAL KNEE ARTHROPLASTY  2008   LEFT/Dr [place]   Family Hx:  Family History  Problem Relation Age of  Onset  . Cancer Mother        ? ovarian - sarcoma  . Cancer Father        Skin cancer  . Diabetes Sister   . COPD Sister   . Depression Sister   . Cancer Sister        Lung - 48 yrs old   Social Hx:   Social History   Tobacco Use  . Smoking status: Former Smoker    Last attempt to quit: 03/31/1948    Years since quitting: 69.2  . Smokeless tobacco: Never Used  Substance Use Topics  . Alcohol use: No  . Drug use: No   Medication:    Current Outpatient Medications:  .  acetaminophen (TYLENOL) 325 MG tablet, Take 2 tablets (650 mg total) by mouth every 4 (four) hours as needed for mild pain or moderate pain., Disp: , Rfl:  .  benzonatate (TESSALON) 200 MG capsule, Take 1 capsule (200 mg total) by mouth 3 (three) times daily., Disp: 20 capsule, Rfl: 0 .  guaiFENesin-dextromethorphan (ROBITUSSIN DM) 100-10 MG/5ML syrup, Take 5 mLs by mouth every 4 (four) hours as needed for cough., Disp: 118 mL, Rfl: 0 .  hydrocortisone 2.5 % cream, Apply topically 3 (three) times daily., Disp: , Rfl:  .  levothyroxine (SYNTHROID, LEVOTHROID) 25 MCG tablet, Take 1 tablet (25 mcg total) by mouth daily., Disp: , Rfl:  .  metoprolol succinate (TOPROL-XL) 25 MG 24 hr tablet, Take 1 tablet (25 mg total) by mouth daily., Disp: , Rfl:  .  multivitamin (RENA-VIT) TABS tablet, Take 1 tablet by mouth daily., Disp: , Rfl:  .  sertraline (ZOLOFT) 100 MG tablet, Take 150 mg by mouth at bedtime. , Disp: , Rfl:  .  sevelamer carbonate (RENVELA) 800 MG tablet, Take 1 tablet (800 mg total) by mouth 3 (three) times daily with meals., Disp: 90 tablet, Rfl: 0 .  traMADol (ULTRAM) 50 MG tablet, Take 1-2 tablets (50-100 mg total) by mouth every 6 (six) hours as needed for moderate pain. (Patient taking differently: Take 50-100 mg by mouth every 6 (six) hours as needed (for mild to moderate pain). ), Disp: 30 tablet, Rfl: 0 .  Vitamin D, Ergocalciferol, (DRISDOL) 50000 units CAPS capsule, Take 50,000 Units by mouth every  Monday. , Disp: , Rfl:  No current facility-administered medications for this visit.   Facility-Administered Medications Ordered in Other Visits:  .  ceFAZolin (ANCEF) IVPB 1 g/50 mL premix, 1 g, Intravenous, Once, Stegmayer, Kimberly A, PA-C   Allergies:  Statins; Codeine sulfate; Codeine; Effexor [venlafaxine]; and Ezetimibe-simvastatin  Review of Systems: Gen:  Denies  fever, sweats, chills HEENT: Denies blurred vision, double vision. bleeds, sore throat Cvc:  No dizziness, chest pain. Resp:   Denies cough or sputum production, shortness of breath Gi: Denies swallowing difficulty, stomach pain. Gu:  Denies bladder incontinence, burning urine Ext:   No Joint pain, stiffness. Skin: No skin rash,  hives  Endoc:  No polyuria, polydipsia. Psych: No depression, insomnia. Other:  All other systems were reviewed with the patient and were negative other that what is mentioned in the HPI.   Physical Examination:   VS: BP 128/70 (BP Location: Left Arm, Cuff Size: Normal)   Pulse 75   Resp 16   Ht 5\' 4"  (1.626 m)   SpO2 96%   BMI 17.85 kg/m   General Appearance: weak, severe bruising on right side of face.  Neuro:without focal findings,  speech normal,  HEENT: PERRLA, EOM intact.  Right clavicle fracture.  Right periorbital edema.  Right facial edema/erythema/bruising. Pulmonary: normal breath sounds, No wheezing.  CardiovascularNormal S1,S2.  No m/r/g.   Abdomen: Benign, Soft, non-tender. Renal:  No costovertebral tenderness  GU:  No performed at this time. Endoc: No evident thyromegaly, no signs of acromegaly. Skin:   warm, no rashes, no ecchymosis  Extremities: normal, no cyanosis, clubbing.  Other findings:    LABORATORY PANEL:   CBC No results for input(s): WBC, HGB, HCT, PLT in the last 168 hours. ------------------------------------------------------------------------------------------------------------------  Chemistries  No results for input(s): NA, K, CL, CO2,  GLUCOSE, BUN, CREATININE, CALCIUM, MG, AST, ALT, ALKPHOS, BILITOT in the last 168 hours.  Invalid input(s): GFRCGP ------------------------------------------------------------------------------------------------------------------  Cardiac Enzymes No results for input(s): TROPONINI in the last 168 hours. ------------------------------------------------------------  RADIOLOGY:  No results found.     Thank  you for the consultation and for allowing [facility] to assist in the care of your patient. Our recommendations are noted above.  Please contact us if we can  be of further service.   Marda Stalker, MD.  Board Certified in Internal Medicine, Pulmonary Medicine, [place], and Sleep Medicine.  [facility] Office Number: [phone]  Patricia Pesa, M.D.  Merton Border, M.D  06/24/2017

## 2017-06-24 NOTE — Patient Instructions (Addendum)
Will send for STAT sleep study.   Start existing CPAP immediately.

## 2017-06-24 NOTE — Telephone Encounter (Signed)
Called [facility] and spoke with Claiborne Billings, Therapist, sports. She stated that [facility] had a [facility] that comes in and does the sleep studies there at the facility.  Pt will have sleep study on Tues. 06/29/17 at [facility].  Called and spoke with Sharyn Lull at [facility] and cancelled order.   Ambrose Pancoast to please fax [place] a copy of study once completed.   Just FYI for physician.  Rhonda J Cobb

## 2017-06-28 ENCOUNTER — Emergency Department: Payer: Medicare Other

## 2017-06-28 ENCOUNTER — Encounter: Payer: Self-pay | Admitting: Emergency Medicine

## 2017-06-28 ENCOUNTER — Inpatient Hospital Stay
Admission: EM | Admit: 2017-06-28 | Discharge: 2017-07-01 | DRG: 280 | Disposition: A | Discharge status: Skilled Nursing Facility | Payer: Medicare Other | Attending: Internal Medicine | Admitting: Internal Medicine

## 2017-06-28 ENCOUNTER — Other Ambulatory Visit: Payer: Self-pay

## 2017-06-28 ENCOUNTER — Ambulatory Visit: Payer: Self-pay | Admitting: Internal Medicine

## 2017-06-28 ENCOUNTER — Inpatient Hospital Stay
Admit: 2017-06-28 | Discharge: 2017-06-28 | Disposition: A | Discharge status: Home / Self Care | Payer: Medicare Other | Attending: Specialist | Admitting: Specialist

## 2017-06-28 DIAGNOSIS — Z95828 Presence of other vascular implants and grafts: Secondary | ICD-10-CM

## 2017-06-28 DIAGNOSIS — I442 Atrioventricular block, complete: Secondary | ICD-10-CM | POA: Diagnosis present

## 2017-06-28 DIAGNOSIS — N186 End stage renal disease: Secondary | ICD-10-CM | POA: Diagnosis present

## 2017-06-28 DIAGNOSIS — I071 Rheumatic tricuspid insufficiency: Secondary | ICD-10-CM | POA: Diagnosis present

## 2017-06-28 DIAGNOSIS — Z992 Dependence on renal dialysis: Secondary | ICD-10-CM

## 2017-06-28 DIAGNOSIS — J189 Pneumonia, unspecified organism: Secondary | ICD-10-CM | POA: Diagnosis present

## 2017-06-28 DIAGNOSIS — Z515 Encounter for palliative care: Secondary | ICD-10-CM | POA: Diagnosis present

## 2017-06-28 DIAGNOSIS — D631 Anemia in chronic kidney disease: Secondary | ICD-10-CM | POA: Diagnosis present

## 2017-06-28 DIAGNOSIS — S8001XA Contusion of right knee, initial encounter: Secondary | ICD-10-CM | POA: Diagnosis present

## 2017-06-28 DIAGNOSIS — Y92129 Unspecified place in nursing home as the place of occurrence of the external cause: Secondary | ICD-10-CM

## 2017-06-28 DIAGNOSIS — I272 Pulmonary hypertension, unspecified: Secondary | ICD-10-CM | POA: Diagnosis present

## 2017-06-28 DIAGNOSIS — Z950 Presence of cardiac pacemaker: Secondary | ICD-10-CM

## 2017-06-28 DIAGNOSIS — R748 Abnormal levels of other serum enzymes: Secondary | ICD-10-CM | POA: Diagnosis present

## 2017-06-28 DIAGNOSIS — I132 Hypertensive heart and chronic kidney disease with heart failure and with stage 5 chronic kidney disease, or end stage renal disease: Secondary | ICD-10-CM | POA: Diagnosis present

## 2017-06-28 DIAGNOSIS — F0390 Unspecified dementia without behavioral disturbance: Secondary | ICD-10-CM | POA: Diagnosis present

## 2017-06-28 DIAGNOSIS — I5022 Chronic systolic (congestive) heart failure: Secondary | ICD-10-CM | POA: Diagnosis present

## 2017-06-28 DIAGNOSIS — Z885 Allergy status to narcotic agent status: Secondary | ICD-10-CM

## 2017-06-28 DIAGNOSIS — Z66 Do not resuscitate: Secondary | ICD-10-CM | POA: Diagnosis present

## 2017-06-28 DIAGNOSIS — N390 Urinary tract infection, site not specified: Secondary | ICD-10-CM

## 2017-06-28 DIAGNOSIS — R296 Repeated falls: Secondary | ICD-10-CM | POA: Diagnosis present

## 2017-06-28 DIAGNOSIS — F329 Major depressive disorder, single episode, unspecified: Secondary | ICD-10-CM | POA: Diagnosis present

## 2017-06-28 DIAGNOSIS — S20219A Contusion of unspecified front wall of thorax, initial encounter: Secondary | ICD-10-CM | POA: Diagnosis present

## 2017-06-28 DIAGNOSIS — R0902 Hypoxemia: Secondary | ICD-10-CM | POA: Diagnosis present

## 2017-06-28 DIAGNOSIS — Z79899 Other long term (current) drug therapy: Secondary | ICD-10-CM

## 2017-06-28 DIAGNOSIS — G4733 Obstructive sleep apnea (adult) (pediatric): Secondary | ICD-10-CM | POA: Diagnosis present

## 2017-06-28 DIAGNOSIS — Z888 Allergy status to other drugs, medicaments and biological substances status: Secondary | ICD-10-CM

## 2017-06-28 DIAGNOSIS — I255 Ischemic cardiomyopathy: Secondary | ICD-10-CM | POA: Diagnosis present

## 2017-06-28 DIAGNOSIS — R778 Other specified abnormalities of plasma proteins: Secondary | ICD-10-CM

## 2017-06-28 DIAGNOSIS — G9341 Metabolic encephalopathy: Secondary | ICD-10-CM | POA: Diagnosis present

## 2017-06-28 DIAGNOSIS — I214 Non-ST elevation (NSTEMI) myocardial infarction: Secondary | ICD-10-CM | POA: Diagnosis present

## 2017-06-28 DIAGNOSIS — I2510 Atherosclerotic heart disease of native coronary artery without angina pectoris: Secondary | ICD-10-CM | POA: Diagnosis present

## 2017-06-28 DIAGNOSIS — R7989 Other specified abnormal findings of blood chemistry: Secondary | ICD-10-CM | POA: Diagnosis present

## 2017-06-28 DIAGNOSIS — W19XXXA Unspecified fall, initial encounter: Secondary | ICD-10-CM | POA: Diagnosis present

## 2017-06-28 DIAGNOSIS — N2581 Secondary hyperparathyroidism of renal origin: Secondary | ICD-10-CM | POA: Diagnosis present

## 2017-06-28 DIAGNOSIS — I739 Peripheral vascular disease, unspecified: Secondary | ICD-10-CM | POA: Diagnosis present

## 2017-06-28 DIAGNOSIS — Z96652 Presence of left artificial knee joint: Secondary | ICD-10-CM | POA: Diagnosis present

## 2017-06-28 DIAGNOSIS — S0083XA Contusion of other part of head, initial encounter: Secondary | ICD-10-CM | POA: Diagnosis present

## 2017-06-28 DIAGNOSIS — Z7401 Bed confinement status: Secondary | ICD-10-CM

## 2017-06-28 DIAGNOSIS — S50311A Abrasion of right elbow, initial encounter: Secondary | ICD-10-CM | POA: Diagnosis present

## 2017-06-28 DIAGNOSIS — I252 Old myocardial infarction: Secondary | ICD-10-CM

## 2017-06-28 DIAGNOSIS — I447 Left bundle-branch block, unspecified: Secondary | ICD-10-CM | POA: Diagnosis present

## 2017-06-28 DIAGNOSIS — M1990 Unspecified osteoarthritis, unspecified site: Secondary | ICD-10-CM | POA: Diagnosis present

## 2017-06-28 DIAGNOSIS — E039 Hypothyroidism, unspecified: Secondary | ICD-10-CM | POA: Diagnosis present

## 2017-06-28 DIAGNOSIS — Z7189 Other specified counseling: Secondary | ICD-10-CM | POA: Diagnosis not present

## 2017-06-28 DIAGNOSIS — H8109 Meniere's disease, unspecified ear: Secondary | ICD-10-CM | POA: Diagnosis present

## 2017-06-28 DIAGNOSIS — Z9989 Dependence on other enabling machines and devices: Secondary | ICD-10-CM

## 2017-06-28 DIAGNOSIS — S8002XA Contusion of left knee, initial encounter: Secondary | ICD-10-CM | POA: Diagnosis present

## 2017-06-28 DIAGNOSIS — R319 Hematuria, unspecified: Secondary | ICD-10-CM

## 2017-06-28 DIAGNOSIS — Z87891 Personal history of nicotine dependence: Secondary | ICD-10-CM

## 2017-06-28 LAB — CBC WITH DIFFERENTIAL/PLATELET
BASOS ABS: 0 10*3/uL (ref 0–0.1)
Basophils Relative: 0 %
EOS ABS: 0 10*3/uL (ref 0–0.7)
EOS PCT: 0 %
HCT: 34.1 % — ABNORMAL LOW (ref 35.0–47.0)
HEMOGLOBIN: 11.3 g/dL — AB (ref 12.0–16.0)
LYMPHS ABS: 0.6 10*3/uL — AB (ref 1.0–3.6)
LYMPHS PCT: 6 %
MCH: 31.4 pg (ref 26.0–34.0)
MCHC: 33.3 g/dL (ref 32.0–36.0)
MCV: 94.3 fL (ref 80.0–100.0)
Monocytes Absolute: 0.8 10*3/uL (ref 0.2–0.9)
Monocytes Relative: 8 %
NEUTROS PCT: 86 %
Neutro Abs: 8.4 10*3/uL — ABNORMAL HIGH (ref 1.4–6.5)
Platelets: 263 10*3/uL (ref 150–440)
RBC: 3.61 MIL/uL — AB (ref 3.80–5.20)
RDW: 21.2 % — ABNORMAL HIGH (ref 11.5–14.5)
WBC: 9.9 10*3/uL (ref 3.6–11.0)

## 2017-06-28 LAB — COMPREHENSIVE METABOLIC PANEL
ALT: 69 U/L — ABNORMAL HIGH (ref 14–54)
ANION GAP: 21 — AB (ref 5–15)
AST: 130 U/L — ABNORMAL HIGH (ref 15–41)
Albumin: 3.7 g/dL (ref 3.5–5.0)
Alkaline Phosphatase: 179 U/L — ABNORMAL HIGH (ref 38–126)
BUN: 73 mg/dL — ABNORMAL HIGH (ref 6–20)
CHLORIDE: 99 mmol/L — AB (ref 101–111)
CO2: 20 mmol/L — AB (ref 22–32)
CREATININE: 5.6 mg/dL — AB (ref 0.44–1.00)
Calcium: 9.2 mg/dL (ref 8.9–10.3)
GFR, EST AFRICAN AMERICAN: 7 mL/min — AB (ref >60)
GFR, EST NON AFRICAN AMERICAN: 6 mL/min — AB (ref >60)
Glucose, Bld: 110 mg/dL — ABNORMAL HIGH (ref 65–99)
Potassium: 5.6 mmol/L — ABNORMAL HIGH (ref 3.5–5.1)
Sodium: 140 mmol/L (ref 135–145)
Total Bilirubin: 3.6 mg/dL — ABNORMAL HIGH (ref 0.3–1.2)
Total Protein: 7.6 g/dL (ref 6.5–8.1)

## 2017-06-28 LAB — CBC
HCT: 35 % (ref 35.0–47.0)
Hemoglobin: 11.5 g/dL — ABNORMAL LOW (ref 12.0–16.0)
MCH: 31.2 pg (ref 26.0–34.0)
MCHC: 32.8 g/dL (ref 32.0–36.0)
MCV: 94.9 fL (ref 80.0–100.0)
PLATELETS: 246 10*3/uL (ref 150–440)
RBC: 3.69 MIL/uL — ABNORMAL LOW (ref 3.80–5.20)
RDW: 21.4 % — AB (ref 11.5–14.5)
WBC: 8.6 10*3/uL (ref 3.6–11.0)

## 2017-06-28 LAB — URINALYSIS, COMPLETE (UACMP) WITH MICROSCOPIC
BILIRUBIN URINE: NEGATIVE
GLUCOSE, UA: 50 mg/dL — AB
HGB URINE DIPSTICK: NEGATIVE
KETONES UR: NEGATIVE mg/dL
LEUKOCYTES UA: NEGATIVE
Nitrite: NEGATIVE
PH: 5 (ref 5.0–8.0)
Protein, ur: >=300 mg/dL — AB
Specific Gravity, Urine: 1.023 (ref 1.005–1.030)

## 2017-06-28 LAB — TROPONIN I
TROPONIN I: 2.99 ng/mL — AB (ref <0.03)
TROPONIN I: 3 ng/mL — AB (ref <0.03)
TROPONIN I: 2.96 ng/mL — AB (ref <0.03)
Troponin I: 2.64 ng/mL (ref <0.03)

## 2017-06-28 LAB — MRSA PCR SCREENING: MRSA by PCR: NEGATIVE

## 2017-06-28 MED ORDER — PENTAFLUOROPROP-TETRAFLUOROETH EX AERO
1.0000 "application " | INHALATION_SPRAY | CUTANEOUS | Status: DC | PRN
  Filled 2017-06-28: qty 30

## 2017-06-28 MED ORDER — ALTEPLASE 2 MG IJ SOLR: 2.0000 mg | Freq: Once | INTRAMUSCULAR | Status: DC | PRN

## 2017-06-28 MED ORDER — ACETAMINOPHEN 650 MG RE SUPP
650.0000 mg | Freq: Four times a day (QID) | RECTAL | Status: DC | PRN
  Administered 2017-06-29 (×2): 650 mg via RECTAL
  Filled 2017-06-28 (×2): qty 1

## 2017-06-28 MED ORDER — LIDOCAINE HCL (PF) 1 % IJ SOLN
5.0000 mL | INTRAMUSCULAR | Status: DC | PRN
  Filled 2017-06-28: qty 5

## 2017-06-28 MED ORDER — LIDOCAINE-PRILOCAINE 2.5-2.5 % EX CREA
1.0000 "application " | TOPICAL_CREAM | CUTANEOUS | Status: DC | PRN
  Filled 2017-06-28: qty 5

## 2017-06-28 MED ORDER — HEPARIN SODIUM (PORCINE) 1000 UNIT/ML DIALYSIS
1000.0000 [IU] | INTRAMUSCULAR | Status: DC | PRN
  Filled 2017-06-28: qty 1

## 2017-06-28 MED ORDER — HEPARIN SODIUM (PORCINE) 5000 UNIT/ML IJ SOLN
5000.0000 [IU] | Freq: Three times a day (TID) | INTRAMUSCULAR | Status: DC
  Administered 2017-06-28 – 2017-07-01 (×8): 5000 [IU] via SUBCUTANEOUS
  Filled 2017-06-28 (×8): qty 1

## 2017-06-28 MED ORDER — VITAMIN D (ERGOCALCIFEROL) 1.25 MG (50000 UNIT) PO CAPS
50000.0000 [IU] | ORAL_CAPSULE | ORAL | Status: DC
Start: 2017-06-28 — End: 2017-07-01
  Filled 2017-06-28: qty 1

## 2017-06-28 MED ORDER — SODIUM CHLORIDE 0.9 % IV SOLN: 100.0000 mL | INTRAVENOUS | Status: DC | PRN

## 2017-06-28 MED ORDER — RENA-VITE PO TABS
1.0000 | ORAL_TABLET | Freq: Every day | ORAL | Status: DC
  Administered 2017-06-30 – 2017-07-01 (×2): 1 via ORAL
  Filled 2017-06-28 (×4): qty 1

## 2017-06-28 MED ORDER — SEVELAMER CARBONATE 800 MG PO TABS
800.0000 mg | ORAL_TABLET | Freq: Three times a day (TID) | ORAL | Status: DC
  Administered 2017-06-29 – 2017-07-01 (×5): 800 mg via ORAL
  Filled 2017-06-28 (×5): qty 1

## 2017-06-28 MED ORDER — ONDANSETRON HCL 4 MG/2ML IJ SOLN
4.0000 mg | Freq: Four times a day (QID) | INTRAMUSCULAR | Status: DC | PRN
  Administered 2017-06-28 – 2017-06-30 (×2): 4 mg via INTRAVENOUS
  Filled 2017-06-28 (×2): qty 2

## 2017-06-28 MED ORDER — SERTRALINE HCL 50 MG PO TABS: 150.0000 mg | ORAL_TABLET | Freq: Every day | ORAL | Status: DC

## 2017-06-28 MED ORDER — METOPROLOL SUCCINATE ER 25 MG PO TB24
25.0000 mg | ORAL_TABLET | Freq: Every day | ORAL | Status: DC
  Administered 2017-06-30: 25 mg via ORAL
  Filled 2017-06-28: qty 1

## 2017-06-28 MED ORDER — ASPIRIN EC 81 MG PO TBEC
81.0000 mg | DELAYED_RELEASE_TABLET | Freq: Every day | ORAL | Status: DC
  Administered 2017-06-30 – 2017-07-01 (×2): 81 mg via ORAL
  Filled 2017-06-28 (×3): qty 1

## 2017-06-28 MED ORDER — ONDANSETRON HCL 4 MG PO TABS: 4.0000 mg | ORAL_TABLET | Freq: Four times a day (QID) | ORAL | Status: DC | PRN

## 2017-06-28 MED ORDER — ACETAMINOPHEN 325 MG PO TABS
650.0000 mg | ORAL_TABLET | Freq: Four times a day (QID) | ORAL | Status: DC | PRN
  Administered 2017-07-01: 650 mg via ORAL
  Filled 2017-06-28: qty 2

## 2017-06-28 MED ORDER — CHLORHEXIDINE GLUCONATE CLOTH 2 % EX PADS
6.0000 | MEDICATED_PAD | Freq: Every day | CUTANEOUS | Status: DC
Start: 2017-06-29 — End: 2017-06-28

## 2017-06-28 MED ORDER — LEVOTHYROXINE SODIUM 25 MCG PO TABS
25.0000 ug | ORAL_TABLET | Freq: Every day | ORAL | Status: DC
  Administered 2017-06-30 – 2017-07-01 (×2): 25 ug via ORAL
  Filled 2017-06-28 (×2): qty 1

## 2017-06-28 NOTE — Progress Notes (Signed)
HD tx start    06/28/17 1925  Vital Signs  Pulse Rate 77  Pulse Rate Source Monitor  Resp (!) 21  BP (!) 126/54  BP Location Left Arm  BP Method Automatic  Patient Position (if appropriate) Lying  Oxygen Therapy  SpO2 91 %  O2 Device Room Air  During Hemodialysis Assessment  Blood Flow Rate (mL/min) 400 mL/min  Arterial Pressure (mmHg) -220 mmHg  Venous Pressure (mmHg) 190 mmHg  Transmembrane Pressure (mmHg) 70 mmHg  Ultrafiltration Rate (mL/min) 170 mL/min  Dialysate Flow Rate (mL/min) 800 ml/min  Conductivity: Machine  14  HD Safety Checks Performed Yes  Dialysis Fluid Bolus Normal Saline  Bolus Amount (mL) 250 mL  Intra-Hemodialysis Comments Tx initiated

## 2017-06-28 NOTE — Progress Notes (Signed)
Pre HD assessment    06/28/17 1918  Vital Signs  Temp 97.7 F (36.5 C)  Temp Source Axillary  Pulse Rate 75  Pulse Rate Source Monitor  Resp 15  BP (!) 121/58  BP Location Left Arm  BP Method Automatic  Patient Position (if appropriate) Lying  Oxygen Therapy  SpO2 90 %  O2 Device Room Air  Pain Assessment  Pain Scale 0-10  Pain Score Asleep  Dialysis Weight  Weight 46.3 kg (102 lb)  Type of Weight Pre-Dialysis  Time-Out for Hemodialysis  What Procedure? HD  Pt Identifiers(min of two) First/Last Name;MRN/Account#  Correct Site? Yes  Correct Side? Yes  Correct Procedure? Yes  Consents Verified? Yes  Rad Studies Available? N/A  Safety Precautions Reviewed? Yes  Engineer, civil (consulting) Number  ([id number])  Station Number 4  UF/Alarm Test Passed  Conductivity: Meter 13.8  Conductivity: Machine  13.9  pH 7.4  Reverse Osmosis main  Normal Saline Lot Number [id number]  Dialyzer Lot Number [id number]  Disposable Set Lot Number [id number]  Machine Temperature 98.6 F (37 C)  Musician and Audible Yes  Blood Lines Intact and Secured Yes  Pre Treatment Patient Checks  Vascular access used during treatment Catheter  Hepatitis B Surface Antigen Results  (unk, labs outdated, labs drawn today )  Hepatitis B Surface Antibody  (<10)  Date Hepatitis B Surface Antibody Drawn 05/26/17  Hemodialysis Consent Verified Yes  Hemodialysis Standing Orders Initiated Yes  ECG (Telemetry) Monitor On Yes  Prime Ordered Normal Saline  Length of  DialysisTreatment -hour(s) 3 Hour(s)  Dialyzer Elisio 17H NR  Dialysate 2K, 2.5 Ca  Dialysis Anticoagulant None  Dialysate Flow Ordered 800  Blood Flow Rate Ordered 400 mL/min  Ultrafiltration Goal 0 Liters  Pre Treatment Labs Hepatitis B Surface Antigen (HBSAB, )  Dialysis Blood Pressure Support Ordered Normal Saline  Education / Care Plan  Dialysis Education Provided Yes  Documented Education in Care Plan Yes

## 2017-06-28 NOTE — Progress Notes (Signed)
Pre HD assessment    06/28/17 1919  Neurological  Level of Consciousness Responds to Pain  Orientation Level Disoriented X4  Respiratory  Respiratory Pattern Regular;Unlabored  Chest Assessment Chest expansion symmetrical  Cardiac  ECG Monitor Yes  Cardiac Rhythm Ventricular paced  Antiarrhythmic device Yes  Antiarrhythmic device  Antiarrhythmic device Permanent Pacemaker  Vascular  R Radial Pulse +2  L Radial Pulse +2  Integumentary  Integumentary (WDL) X  Skin Integrity Ecchymosis;Abrasion;Catheter entry/exit site (knot of forehead, bruises all over body)  Musculoskeletal  Musculoskeletal (WDL) X  Generalized Weakness Yes  Assistive Device None  Psychosocial  Psychosocial (WDL) X  Patient Behaviors Not interactive  Emotional support given Given to patient

## 2017-06-28 NOTE — ED Provider Notes (Signed)
[facility] Emergency Department Provider Note   ____________________________________________   First MD Initiated Contact with Patient 06/28/17 0700     (approximate)  I have reviewed the triage vital signs and the nursing notes.   HISTORY  Chief Complaint Fall    HPI Sharon [name] is a 84 y.o. female comes from [facility] with report of a fall. She denied loss of consciousness to the triage nurse. When I see her she is lying on her side and not opening her eyes and not responding to questions. She has bruising over her head and face upper chest knees etc. ----------------------------------------- 9:30 AM on 06/28/2017 -----------------------------------------  Patient is now woke up she is awake and alert moving all extremities and answering questions quite well.  Past Medical History:  Diagnosis Date  . Anemia   . Arthritis   . CAD (coronary artery disease)   . Cancer ([place])    skin  . Cardiomyopathy ([place])   . CHF (congestive heart failure) ([place])   . Depression   . IBS (irritable bowel syndrome) 2010  . Kidney failure July 2012   Hemodialysis 3xweek  . Kidney failure   . Meniere disease   . Meniere's disease   . Myocardial infarction ([place])   . OSA (obstructive sleep apnea)    CPAP  . Peritoneal dialysis status ([place])   . Presence of IVC filter 2019  . Presence of permanent cardiac pacemaker   . Renal insufficiency     Patient Active Problem List   Diagnosis Date Noted  . Swelling of limb 06/08/2017  . C. difficile diarrhea   . Palliative care encounter   . Encounter for hospice care discussion   . Protein-calorie malnutrition, severe 05/25/2017  . HCAP (healthcare-associated pneumonia) 05/24/2017  . Complication from renal dialysis device 04/01/2017  . Displaced fracture of lateral end of right clavicle, initial encounter for closed fracture 02/16/2017  . FTT (failure to thrive) in adult 02/03/2017  . ESRD on hemodialysis  ([place])   . Pleural effusion   . Palliative care by specialist   . Pulmonary embolism ([place]) 01/02/2017  . Syncope 01/01/2017  . Vision changes 08/27/2016  . Closed fracture of neck of femur ([place]) 05/26/2016  . Hip fracture ([place]) 05/10/2016  . Falls 04/27/2016  . Head injury 04/27/2016  . Age-related osteoporosis with current pathological fracture with routine healing 04/24/2016  . Recurrent falls 04/24/2016  . Mild protein-calorie malnutrition ([place]) 04/24/2016  . ESRD on dialysis ([place]) 04/10/2016  . Periprosthetic fracture around internal prosthetic left knee joint 01/26/2016  . Pressure injury of skin 12/15/2015  . Closed left subtrochanteric femur fracture ([place]) 12/15/2015  . Femur fracture, left ([place]) 12/14/2015  . Meniere disease   . Loss of weight 10/21/2015  . Clinical depression 06/20/2015  . Dysphagia 05/24/2015  . Imbalance 04/22/2015  . Chronic pain syndrome 05/22/2014  . Insomnia 03/13/2014  . Anxiety state 03/13/2014  . Chronic systolic CHF (congestive heart failure), NYHA class 3 ([place]) 01/15/2014  . OSA (obstructive sleep apnea) 01/15/2014  . Basal cell carcinoma of neck 11/14/2013  . DDD (degenerative disc disease), cervical 11/07/2013  . Cervical radiculitis 10/16/2013  . Benign essential hypertension 09/12/2013  . Memory loss 09/12/2013  . Systolic heart failure, chronic ([place]) 05/12/2013  . Goals of care, counseling/discussion 11/10/2012  . Sinus node dysfunction ([place]) 08/01/2012  . Low back pain 08/01/2012  . Cervical spine pain 05/02/2012  . Irritable bowel syndrome 11/02/2011  . Depression 04/01/2011  .  Hypertension 04/01/2011    Past Surgical History:  Procedure Laterality Date  . [place]  . ABDOMINAL HYSTERECTOMY    . ANUS SURGERY  2010  . AV FISTULA PLACEMENT Right 04/15/2016   Procedure: ARTERIOVENOUS (AV) FISTULA CREATION ( RADIOCEPHALIC ) STAGE 2;  Surgeon: Algernon Huxley, MD;  Location: [facility] ORS;  Service: Vascular;   Laterality: Right;  . CATARACT EXTRACTION  2006, 2011  . CHOLECYSTECTOMY  01/2008  . DIALYSIS/PERMA CATHETER INSERTION N/A 01/07/2017   Procedure: DIALYSIS/PERMA CATHETER INSERTION With an IVC filter placement;  Surgeon: Algernon Huxley, MD;  Location: [place] [facility];  Service: Cardiovascular;  Laterality: N/A;  . DIALYSIS/PERMA CATHETER REMOVAL N/A 08/20/2016   Procedure: Dialysis/Perma Catheter Removal;  Surgeon: Algernon Huxley, MD;  Location: [place] [facility];  Service: Cardiovascular;  Laterality: N/A;  . EYE SURGERY     cataracts bilateral  . FEMUR IM NAIL Left 12/15/2015   Procedure: INTRAMEDULLARY (IM) RETROGRADE FEMORAL NAILING;  Surgeon: Oletta Cohn, DO;  Location: [facility] ORS;  Service: Orthopedics;  Laterality: Left;  . HIP PINNING,CANNULATED Left 05/10/2016   Procedure: CANNULATED HIP PINNING;  Surgeon: Earnestine Leys, MD;  Location: [facility] ORS;  Service: Orthopedics;  Laterality: Left;  . INSERT / REPLACE / REMOVE PACEMAKER    . INSERTION OF DIALYSIS CATHETER  07/2010  . IVC FILTER INSERTION  2019  . JOINT REPLACEMENT     left knee  . MINOR REMOVAL OF PERITONEAL DIALYSIS CATHETER  04/15/2016   Procedure: MINOR REMOVAL OF PERITONEAL DIALYSIS CATHETER;  Surgeon: Algernon Huxley, MD;  Location: [facility] ORS;  Service: Vascular;;  . ORIF CLAVICULAR FRACTURE Right 02/16/2017   Procedure: OPEN REDUCTION INTERNAL FIXATION (ORIF) CLAVICULAR FRACTURE;  Surgeon: Corky Mull, MD;  Location: [facility] ORS;  Service: Orthopedics;  Laterality: Right;  . PACEMAKER INSERTION  2006  . PERIPHERAL VASCULAR CATHETERIZATION N/A 12/18/2015   Procedure: Dialysis/Perma Catheter Insertion;  Surgeon: Katha Cabal, MD;  Location: [place] [facility];  Service: Cardiovascular;  Laterality: N/A;  . TOTAL KNEE ARTHROPLASTY  2008   LEFT/Dr [place]    Prior to Admission medications   Medication Sig Start Date End Date Taking? Authorizing Provider  acetaminophen (TYLENOL) 325 MG tablet Take 2 tablets (650 mg total)  by mouth every 4 (four) hours as needed for mild pain or moderate pain. 05/14/16   Loletha Grayer, MD  benzonatate (TESSALON) 200 MG capsule Take 1 capsule (200 mg total) by mouth 3 (three) times daily. 06/01/17   Dustin Flock, MD  guaiFENesin-dextromethorphan (ROBITUSSIN DM) 100-10 MG/5ML syrup Take 5 mLs by mouth every 4 (four) hours as needed for cough. 06/01/17   Dustin Flock, MD  hydrocortisone 2.5 % cream Apply topically 3 (three) times daily.    [provider]  levothyroxine (SYNTHROID, LEVOTHROID) 25 MCG tablet Take 1 tablet (25 mcg total) by mouth daily. 02/04/17   Bettey Costa, MD  metoprolol succinate (TOPROL-XL) 25 MG 24 hr tablet Take 1 tablet (25 mg total) by mouth daily. 06/01/17   Dustin Flock, MD  multivitamin (RENA-VIT) TABS tablet Take 1 tablet by mouth daily.    [provider]  sertraline (ZOLOFT) 100 MG tablet Take 150 mg by mouth at bedtime.     [provider]  sevelamer carbonate (RENVELA) 800 MG tablet Take 1 tablet (800 mg total) by mouth 3 (three) times daily with meals. 06/01/17   Dustin Flock, MD  traMADol (ULTRAM) 50 MG tablet Take 1-2 tablets (50-100 mg  total) by mouth every 6 (six) hours as needed for moderate pain. Patient taking differently: Take 50-100 mg by mouth every 6 (six) hours as needed (for mild to moderate pain).  06/01/17   Dustin Flock, MD  Vitamin D, Ergocalciferol, (DRISDOL) 50000 units CAPS capsule Take 50,000 Units by mouth every Monday.  04/17/15   [provider]    Allergies Statins; Codeine sulfate; Codeine; Effexor [venlafaxine]; and Ezetimibe-simvastatin  Family History  Problem Relation Age of Onset  . Cancer Mother        ? ovarian - sarcoma  . Cancer Father        Skin cancer  . Diabetes Sister   . COPD Sister   . Depression Sister   . Cancer Sister        Lung - 62 yrs old    Social History Social History   Tobacco Use  . Smoking status: Former Smoker    Last attempt to quit:  03/31/1948    Years since quitting: 69.2  . Smokeless tobacco: Never Used  Substance Use Topics  . Alcohol use: No  . Drug use: No    Review of Systems  Constitutional: No fever/chills Eyes: No visual changes. ENT: No sore throat. Cardiovascular: Denies chest pain. Respiratory: Denies shortness of breath. Gastrointestinal: No abdominal pain.  No nausea, no vomiting.  No diarrhea.  No constipation. Genitourinary: Negative for dysuria. Musculoskeletal: Negative for back pain. Skin: Negative for rash. Neurological: Negative for headaches, focal weakness  ____________________________________________   PHYSICAL EXAM:  VITAL SIGNS: ED Triage Vitals  Enc Vitals Group     BP 06/28/17 0618 (!) 142/73     Pulse Rate 06/28/17 0618 79     Resp 06/28/17 0618 16     Temp 06/28/17 0618 97.9 F (36.6 C)     Temp Source 06/28/17 0618 Axillary     SpO2 06/28/17 0618 98 %     Weight 06/28/17 0620 100 lb (45.4 kg)     Height 06/28/17 0620 5\' 4"  (1.626 m)     Head Circumference --      Peak Flow --      Pain Score 06/28/17 0618 0     Pain Loc --      Pain Edu? --      Excl. in [place]? --     Constitutional: patient is sleepy and difficult to arouse. She is moving all of her extremities will occasionally open her eyes and mumble something which is unintelligible Eyes: Conjunctivae are normal. PER. EOMI. Head: multiple bruises as well as swelling in the right temple Nose: No congestion/rhinnorhea. Mouth/Throat: Mucous membranes are moist.  Oropharynx non-erythematous. Neck: No stridor.  unable to be certain if there is cervical spine tenderness to palpation  Cardiovascular: Normal rate, regular rhythm. Grossly normal heart sounds.  Good peripheral circulation. Respiratory: Normal respiratory effort.  No retractions. Lungs CTAB. Gastrointestinal: Soft and nontender. No distention. No abdominal bruits. No CVA tenderness. Musculoskeletal: No lower extremity tenderness nor edema.  No joint  effusions there are bruises on her chest back or arms legs etc.. Neurologic: patient moving all extremities equally and well however does not appear to hear commands or follow commands and is not really awake. She is occasionally opening her eyes will say 1 or 2 words and is back to sleep again Skin:  multiple bruises over her body   ____________________________________________   LABS (all labs ordered are listed, but only abnormal results are displayed)  Labs Reviewed  COMPREHENSIVE METABOLIC  PANEL - Abnormal; Notable for the following components:      Result Value   Potassium 5.6 (*)    Chloride 99 (*)    CO2 20 (*)    Glucose, Bld 110 (*)    BUN 73 (*)    Creatinine, Ser 5.60 (*)    AST 130 (*)    ALT 69 (*)    Alkaline Phosphatase 179 (*)    Total Bilirubin 3.6 (*)    GFR calc non Af Amer 6 (*)    GFR calc Af Amer 7 (*)    Anion gap 21 (*)    All other components within normal limits  TROPONIN I - Abnormal; Notable for the following components:   Troponin I 2.99 (*)    All other components within normal limits  CBC WITH DIFFERENTIAL/PLATELET - Abnormal; Notable for the following components:   RBC 3.61 (*)    Hemoglobin 11.3 (*)    HCT 34.1 (*)    RDW 21.2 (*)    Neutro Abs 8.4 (*)    Lymphs Abs 0.6 (*)    All other components within normal limits  URINALYSIS, COMPLETE (UACMP) WITH MICROSCOPIC - Abnormal; Notable for the following components:   Color, Urine AMBER (*)    APPearance HAZY (*)    Glucose, UA 50 (*)    Protein, ur >=300 (*)    Bacteria, UA FEW (*)    Non Squamous Epithelial 0-5 (*)    All other components within normal limits   ____________________________________________  EKG EKG read and interpreted by me shows an atrial sensed ventricular paced rhythm  ____________________________________________  RADIOLOGY  ED MD interpretation:  CT head and face and neck show no fracturesor subdural hematoma or any other kind of cranial injury  Official  radiology report(s): Ct Head Wo Contrast  Result Date: 06/28/2017 CLINICAL DATA:  Unwitnessed fall with right frontal trauma. Right facial bruising and neck pain. EXAM: CT HEAD WITHOUT CONTRAST CT MAXILLOFACIAL WITHOUT CONTRAST CT CERVICAL SPINE WITHOUT CONTRAST TECHNIQUE: Multidetector CT imaging of the head, cervical spine, and maxillofacial structures were performed using the standard protocol without intravenous contrast. Multiplanar CT image reconstructions of the cervical spine and maxillofacial structures were also generated. COMPARISON:  06/20/2017 FINDINGS: CT HEAD FINDINGS Brain: No evidence of acute infarction, hemorrhage, hydrocephalus, extra-axial collection or mass lesion/mass effect. Atrophy and chronic small vessel disease. Vascular: Atherosclerotic calcification. Skull: Right scalp hematoma without underlying fracture. CT MAXILLOFACIAL FINDINGS Osseous: Negative for fracture or mandibular dislocation. Bilateral TMJ degenerative arthropathy Orbits: Bilateral cataract resection. No evidence of injury. Sinuses: No evidence of injury Soft tissues: Swelling about the lower left jaw. No opaque foreign body. CT CERVICAL SPINE FINDINGS Alignment: No traumatic malalignment. C4-5 anterolisthesis, stable and degenerative Skull base and vertebrae: Negative for acute fracture. Remote T2 superior endplate fracture. Soft tissues and spinal canal: No prevertebral fluid or swelling. No visible canal hematoma. Disc levels: Upper cervical predominant degenerative facet spurring. Lower cervical predominant disc degeneration. Upper chest: Right pleural effusion again noted. Other: Right clavicle fracture with ORIF earlier this year. There is bulky callus about the fracture. IMPRESSION: 1. No evidence of acute intracranial or cervical spine injury. 2. Right scalp hematoma without fracture. 3. Negative for facial fracture. Electronically Signed   By: Monte Fantasia M.D.   On: 06/28/2017 07:37   Ct Cervical Spine Wo  Contrast  Result Date: 06/28/2017 CLINICAL DATA:  Unwitnessed fall with right frontal trauma. Right facial bruising and neck pain. EXAM: CT HEAD WITHOUT CONTRAST  CT MAXILLOFACIAL WITHOUT CONTRAST CT CERVICAL SPINE WITHOUT CONTRAST TECHNIQUE: Multidetector CT imaging of the head, cervical spine, and maxillofacial structures were performed using the standard protocol without intravenous contrast. Multiplanar CT image reconstructions of the cervical spine and maxillofacial structures were also generated. COMPARISON:  06/20/2017 FINDINGS: CT HEAD FINDINGS Brain: No evidence of acute infarction, hemorrhage, hydrocephalus, extra-axial collection or mass lesion/mass effect. Atrophy and chronic small vessel disease. Vascular: Atherosclerotic calcification. Skull: Right scalp hematoma without underlying fracture. CT MAXILLOFACIAL FINDINGS Osseous: Negative for fracture or mandibular dislocation. Bilateral TMJ degenerative arthropathy Orbits: Bilateral cataract resection. No evidence of injury. Sinuses: No evidence of injury Soft tissues: Swelling about the lower left jaw. No opaque foreign body. CT CERVICAL SPINE FINDINGS Alignment: No traumatic malalignment. C4-5 anterolisthesis, stable and degenerative Skull base and vertebrae: Negative for acute fracture. Remote T2 superior endplate fracture. Soft tissues and spinal canal: No prevertebral fluid or swelling. No visible canal hematoma. Disc levels: Upper cervical predominant degenerative facet spurring. Lower cervical predominant disc degeneration. Upper chest: Right pleural effusion again noted. Other: Right clavicle fracture with ORIF earlier this year. There is bulky callus about the fracture. IMPRESSION: 1. No evidence of acute intracranial or cervical spine injury. 2. Right scalp hematoma without fracture. 3. Negative for facial fracture. Electronically Signed   By: Monte Fantasia M.D.   On: 06/28/2017 07:37   Dg Chest Portable 1 View  Result Date:  06/28/2017 CLINICAL DATA:  84 year old female post fall.  Initial encounter. EXAM: PORTABLE CHEST 1 VIEW COMPARISON:  05/25/2017 chest x-ray.  06/16/2017 chest CT. FINDINGS: Slight increase in size of moderately large right-sided pleural effusion. Persistent consolidation right middle lobe and right lung base. Cardiomegaly with biventricular pacer in place. Right central line tip caval atrial junction. No pneumothorax. Calcified tortuous aorta. Remote right clavicle fracture with angulation of fracture fragments and prominent callus formation. Clavicle plate and screws in place. No new fracture noted. IMPRESSION: Slight increase in size of moderately large right-sided pleural effusion. Persistent consolidation right middle lobe and right lung base. Cardiomegaly with biventricular pacer in place. Right central line tip caval atrial junction. Aortic Atherosclerosis (ICD10-I70.0). Remote right clavicle fracture with angulation of fracture fragments and prominent callus formation. Clavicle plate and screws in place. Electronically Signed   By: Genia Del M.D.   On: 06/28/2017 08:43   Ct Maxillofacial Wo Contrast  Result Date: 06/28/2017 CLINICAL DATA:  Unwitnessed fall with right frontal trauma. Right facial bruising and neck pain. EXAM: CT HEAD WITHOUT CONTRAST CT MAXILLOFACIAL WITHOUT CONTRAST CT CERVICAL SPINE WITHOUT CONTRAST TECHNIQUE: Multidetector CT imaging of the head, cervical spine, and maxillofacial structures were performed using the standard protocol without intravenous contrast. Multiplanar CT image reconstructions of the cervical spine and maxillofacial structures were also generated. COMPARISON:  06/20/2017 FINDINGS: CT HEAD FINDINGS Brain: No evidence of acute infarction, hemorrhage, hydrocephalus, extra-axial collection or mass lesion/mass effect. Atrophy and chronic small vessel disease. Vascular: Atherosclerotic calcification. Skull: Right scalp hematoma without underlying fracture. CT  MAXILLOFACIAL FINDINGS Osseous: Negative for fracture or mandibular dislocation. Bilateral TMJ degenerative arthropathy Orbits: Bilateral cataract resection. No evidence of injury. Sinuses: No evidence of injury Soft tissues: Swelling about the lower left jaw. No opaque foreign body. CT CERVICAL SPINE FINDINGS Alignment: No traumatic malalignment. C4-5 anterolisthesis, stable and degenerative Skull base and vertebrae: Negative for acute fracture. Remote T2 superior endplate fracture. Soft tissues and spinal canal: No prevertebral fluid or swelling. No visible canal hematoma. Disc levels: Upper cervical predominant degenerative facet spurring. Lower cervical predominant  disc degeneration. Upper chest: Right pleural effusion again noted. Other: Right clavicle fracture with ORIF earlier this year. There is bulky callus about the fracture. IMPRESSION: 1. No evidence of acute intracranial or cervical spine injury. 2. Right scalp hematoma without fracture. 3. Negative for facial fracture. Electronically Signed   By: Monte Fantasia M.D.   On: 06/28/2017 07:37    ____________________________________________   PROCEDURES  Procedure(s) performed:   Procedures  Critical Care performed:   ____________________________________________   INITIAL IMPRESSION / ASSESSMENT AND PLAN / ED COURSE  patient has history of multiple falls obstructive sleep apnea resulting in extreme sleepiness she had lab work done on the 22nd of last month which was reviewed by me. I will repeat some of this to make sure there is no marked changes as according to the history or mental status as changed.  patient's troponin today is 2.99 this is much higher than her troponins last week which were 0.1additionally she does make some urine even though she is on dialysis and there is signs of infection in the urine. We'll get her in the hospital.        ____________________________________________   FINAL CLINICAL IMPRESSION(S) /  ED DIAGNOSES  Final diagnoses:  Fall, initial encounter  Elevated troponin  Urinary tract infection with hematuria, site unspecified     ED Discharge Orders    None       Note:  This document was prepared using Dragon voice recognition software and may include unintentional dictation errors.    Nena Polio, MD 06/28/17 1022

## 2017-06-28 NOTE — Progress Notes (Signed)
Patient admitted in a very lethargic and confused state. Speech is mostly incomprehensible. Sounds at one point like she said, "they hurt me". Then patient fell back asleep. Cannot complete admission database at this time. Will notify oncoming shift during report. Will continue to monitor patient closely. Sharon [name] Low [facility]

## 2017-06-28 NOTE — Progress Notes (Signed)
Post HD assessment    06/28/17 2242  Neurological  Level of Consciousness Responds to Voice  Orientation Level Oriented to person  Respiratory  Respiratory Pattern Regular;Unlabored  Chest Assessment Chest expansion symmetrical  Cardiac  ECG Monitor Yes  Cardiac Rhythm Ventricular paced  Antiarrhythmic device Yes  Antiarrhythmic device  Antiarrhythmic device Permanent Pacemaker  Vascular  R Radial Pulse +2  L Radial Pulse +2  Integumentary  Integumentary (WDL) X  Musculoskeletal  Musculoskeletal (WDL) X  Generalized Weakness Yes  Assistive Device None  Psychosocial  Psychosocial (WDL) X  Patient Behaviors Not interactive  Emotional support given Given to patient

## 2017-06-28 NOTE — H&P (Signed)
[facility] [name]    MR#:  [id number]  DATE OF BIRTH:  05/19/1926  DATE OF ADMISSION:  06/28/2017  PRIMARY CARE PHYSICIAN: Kirk Ruths, MD   REQUESTING/REFERRING PHYSICIAN: Dr. Conni Slipper  CHIEF COMPLAINT:   Chief Complaint  Patient presents with  . Fall    HISTORY OF PRESENT ILLNESS:  Sharon [name]  is a 84 y.o. female with a known history of advanced dementia, coronary artery disease, ischemic cardiomyopathy ejection fraction of 82-42%, chronic systolic CHF, irritable bowel syndrome, end-stage renal disease on hemodialysis, history of obstructive sleep apnea, recurrent falls who presents to the hospital from a skilled nursing facility due to a fall and incidentally noted to have a troponin of 2.9.  Patient herself is somewhat lethargic and is a very poor historian therefore most of the history obtained from the chart and from the ER physician.  Patient has a history of recurrent falls and apparently had a fall this morning and was more weak and lethargic and therefore sent to the ER for further evaluation.  Her trauma work-up has essentially been benign with a negative CT head and cervical spine she does have significant bruising to the right side of her face with a scalp hematoma.  Incidentally she was noted to have an elevated troponin of 2.9 when her previous troponin was 0.1.  Her EKG shows a paced rhythm with a chronic left bundle branch block.  Hospitalist services were contacted for treatment evaluation.  PAST MEDICAL HISTORY:   Past Medical History:  Diagnosis Date  . Anemia   . Arthritis   . CAD (coronary artery disease)   . Cancer ([place])    skin  . Cardiomyopathy ([place])   . CHF (congestive heart failure) ([place])   . Depression   . IBS (irritable bowel syndrome) 2010  . Kidney failure July 2012   Hemodialysis 3xweek  . Kidney failure   . Meniere disease   . Meniere's disease   . Myocardial  infarction ([place])   . OSA (obstructive sleep apnea)    CPAP  . Peritoneal dialysis status ([place])   . Presence of IVC filter 2019  . Presence of permanent cardiac pacemaker   . Renal insufficiency     PAST SURGICAL HISTORY:   Past Surgical History:  Procedure Laterality Date  . [place]  . ABDOMINAL HYSTERECTOMY    . ANUS SURGERY  2010  . AV FISTULA PLACEMENT Right 04/15/2016   Procedure: ARTERIOVENOUS (AV) FISTULA CREATION ( RADIOCEPHALIC ) STAGE 2;  Surgeon: Algernon Huxley, MD;  Location: [facility];  Service: Vascular;  Laterality: Right;  . CATARACT EXTRACTION  2006, 2011  . CHOLECYSTECTOMY  01/2008  . DIALYSIS/PERMA CATHETER INSERTION N/A 01/07/2017   Procedure: DIALYSIS/PERMA CATHETER INSERTION With an IVC filter placement;  Surgeon: Algernon Huxley, MD;  Location: [place] [facility];  Service: Cardiovascular;  Laterality: N/A;  . DIALYSIS/PERMA CATHETER REMOVAL N/A 08/20/2016   Procedure: Dialysis/Perma Catheter Removal;  Surgeon: Algernon Huxley, MD;  Location: [place] [facility];  Service: Cardiovascular;  Laterality: N/A;  . EYE SURGERY     cataracts bilateral  . FEMUR IM NAIL Left 12/15/2015   Procedure: INTRAMEDULLARY (IM) RETROGRADE FEMORAL NAILING;  Surgeon: Oletta Cohn, DO;  Location: [facility];  Service: Orthopedics;  Laterality: Left;  . HIP PINNING,CANNULATED Left 05/10/2016   Procedure: CANNULATED HIP PINNING;  Surgeon: Earnestine Leys, MD;  Location: [facility];  Service: Orthopedics;  Laterality: Left;  . INSERT / REPLACE / REMOVE PACEMAKER    . INSERTION OF DIALYSIS CATHETER  07/2010  . IVC FILTER INSERTION  2019  . JOINT REPLACEMENT     left knee  . MINOR REMOVAL OF PERITONEAL DIALYSIS CATHETER  04/15/2016   Procedure: MINOR REMOVAL OF PERITONEAL DIALYSIS CATHETER;  Surgeon: Algernon Huxley, MD;  Location: [facility];  Service: Vascular;;  . ORIF CLAVICULAR FRACTURE Right 02/16/2017   Procedure: OPEN REDUCTION INTERNAL FIXATION (ORIF) CLAVICULAR FRACTURE;   Surgeon: Corky Mull, MD;  Location: [facility];  Service: Orthopedics;  Laterality: Right;  . PACEMAKER INSERTION  2006  . PERIPHERAL VASCULAR CATHETERIZATION N/A 12/18/2015   Procedure: Dialysis/Perma Catheter Insertion;  Surgeon: Katha Cabal, MD;  Location: [place] [facility];  Service: Cardiovascular;  Laterality: N/A;  . TOTAL KNEE ARTHROPLASTY  2008   LEFT/Dr [place]    SOCIAL HISTORY:   Social History   Tobacco Use  . Smoking status: Former Smoker    Last attempt to quit: 03/31/1948    Years since quitting: 69.2  . Smokeless tobacco: Never Used  Substance Use Topics  . Alcohol use: No    FAMILY HISTORY:   Family History  Problem Relation Age of Onset  . Cancer Mother        ? ovarian - sarcoma  . Cancer Father        Skin cancer  . Diabetes Sister   . COPD Sister   . Depression Sister   . Cancer Sister        Lung - 36 yrs old    DRUG ALLERGIES:   Allergies  Allergen Reactions  . Statins Other (See Comments)    Muscle weakness severe  . Codeine Sulfate Nausea And Vomiting  . Codeine Other (See Comments)    GI UPSET  . Effexor [Venlafaxine] Nausea Only  . Ezetimibe-Simvastatin Other (See Comments)    Muscle weakness    REVIEW OF SYSTEMS:   Review of Systems  Unable to perform ROS: Mental acuity    MEDICATIONS AT HOME:   Prior to Admission medications   Medication Sig Start Date End Date Taking? Authorizing Provider  acetaminophen (TYLENOL) 325 MG tablet Take 2 tablets (650 mg total) by mouth every 4 (four) hours as needed for mild pain or moderate pain. 05/14/16   Loletha Grayer, MD  benzonatate (TESSALON) 200 MG capsule Take 1 capsule (200 mg total) by mouth 3 (three) times daily. 06/01/17   Dustin Flock, MD  guaiFENesin-dextromethorphan (ROBITUSSIN DM) 100-10 MG/5ML syrup Take 5 mLs by mouth every 4 (four) hours as needed for cough. 06/01/17   Dustin Flock, MD  hydrocortisone 2.5 % cream Apply topically 3 (three) times daily.    [provider]  levothyroxine (SYNTHROID, LEVOTHROID) 25 MCG tablet Take 1 tablet (25 mcg total) by mouth daily. 02/04/17   Bettey Costa, MD  metoprolol succinate (TOPROL-XL) 25 MG 24 hr tablet Take 1 tablet (25 mg total) by mouth daily. 06/01/17   Dustin Flock, MD  multivitamin (RENA-VIT) TABS tablet Take 1 tablet by mouth daily.    [provider]  sertraline (ZOLOFT) 100 MG tablet Take 150 mg by mouth at bedtime.     [provider]  sevelamer carbonate (RENVELA) 800 MG tablet Take 1 tablet (800 mg total) by mouth 3 (three) times daily with meals. 06/01/17   Dustin Flock, MD  traMADol (ULTRAM) 50 MG tablet Take 1-2 tablets (50-100 mg total) by mouth  every 6 (six) hours as needed for moderate pain. Patient taking differently: Take 50-100 mg by mouth every 6 (six) hours as needed (for mild to moderate pain).  06/01/17   Dustin Flock, MD  Vitamin D, Ergocalciferol, (DRISDOL) 50000 units CAPS capsule Take 50,000 Units by mouth every Monday.  04/17/15   [provider]      VITAL SIGNS:  Blood pressure (!) 141/75, pulse 79, temperature 97.9 F (36.6 C), temperature source Axillary, resp. rate 16, height 5\' 4"  (1.626 m), weight 45.4 kg (100 lb), SpO2 96 %.  PHYSICAL EXAMINATION:  Physical Exam  GENERAL:  84 y.o.-year-old patient lying in the bed lethargic/confused but in NAD.   EYES: Pupils equal, round, reactive to light and accommodation. No scleral icterus. Extraocular muscles intact. Significant bruising noted on the right side of face with some periorbital edema. HEENT: Head atraumatic, normocephalic. Oropharynx and nasopharynx clear. No oropharyngeal erythema, moist oral mucosa  NECK:  Supple, no jugular venous distention. No thyroid enlargement, no tenderness.  LUNGS: Normal breath sounds bilaterally, no wheezing, rales, rhonchi. No use of accessory muscles of respiration.  CARDIOVASCULAR: S1, S2 RRR. No murmurs, rubs, gallops, clicks.  ABDOMEN: Soft,  nontender, nondistended. Bowel sounds present. No organomegaly or mass.  EXTREMITIES: No pedal edema, cyanosis, or clubbing. + 2 pedal & radial pulses b/l.   NEUROLOGIC: Cranial nerves II through XII are intact. No focal Motor or sensory deficits appreciated b/l. Globally weak PSYCHIATRIC: The patient is alert and oriented x 1. Good affect.  SKIN: No obvious rash, lesion, or ulcer.   Right upper extremity AV fistula with good bruit and thrill, left chest wall PermCath in place.  LABORATORY PANEL:   CBC Recent Labs  Lab 06/28/17 0906  WBC 9.9  HGB 11.3*  HCT 34.1*  PLT 263   ------------------------------------------------------------------------------------------------------------------  Chemistries  Recent Labs  Lab 06/28/17 0906  NA 140  K 5.6*  CL 99*  CO2 20*  GLUCOSE 110*  BUN 73*  CREATININE 5.60*  CALCIUM 9.2  AST 130*  ALT 69*  ALKPHOS 179*  BILITOT 3.6*   ------------------------------------------------------------------------------------------------------------------  Cardiac Enzymes Recent Labs  Lab 06/28/17 0906  TROPONINI 2.99*   ------------------------------------------------------------------------------------------------------------------  RADIOLOGY:  Ct Head Wo Contrast  Result Date: 06/28/2017 CLINICAL DATA:  Unwitnessed fall with right frontal trauma. Right facial bruising and neck pain. EXAM: CT HEAD WITHOUT CONTRAST CT MAXILLOFACIAL WITHOUT CONTRAST CT CERVICAL SPINE WITHOUT CONTRAST TECHNIQUE: Multidetector CT imaging of the head, cervical spine, and maxillofacial structures were performed using the standard protocol without intravenous contrast. Multiplanar CT image reconstructions of the cervical spine and maxillofacial structures were also generated. COMPARISON:  06/20/2017 FINDINGS: CT HEAD FINDINGS Brain: No evidence of acute infarction, hemorrhage, hydrocephalus, extra-axial collection or mass lesion/mass effect. Atrophy and chronic small  vessel disease. Vascular: Atherosclerotic calcification. Skull: Right scalp hematoma without underlying fracture. CT MAXILLOFACIAL FINDINGS Osseous: Negative for fracture or mandibular dislocation. Bilateral TMJ degenerative arthropathy Orbits: Bilateral cataract resection. No evidence of injury. Sinuses: No evidence of injury Soft tissues: Swelling about the lower left jaw. No opaque foreign body. CT CERVICAL SPINE FINDINGS Alignment: No traumatic malalignment. C4-5 anterolisthesis, stable and degenerative Skull base and vertebrae: Negative for acute fracture. Remote T2 superior endplate fracture. Soft tissues and spinal canal: No prevertebral fluid or swelling. No visible canal hematoma. Disc levels: Upper cervical predominant degenerative facet spurring. Lower cervical predominant disc degeneration. Upper chest: Right pleural effusion again noted. Other: Right clavicle fracture with ORIF earlier this year. There is bulky callus  about the fracture. IMPRESSION: 1. No evidence of acute intracranial or cervical spine injury. 2. Right scalp hematoma without fracture. 3. Negative for facial fracture. Electronically Signed   By: Monte Fantasia M.D.   On: 06/28/2017 07:37   Ct Cervical Spine Wo Contrast  Result Date: 06/28/2017 CLINICAL DATA:  Unwitnessed fall with right frontal trauma. Right facial bruising and neck pain. EXAM: CT HEAD WITHOUT CONTRAST CT MAXILLOFACIAL WITHOUT CONTRAST CT CERVICAL SPINE WITHOUT CONTRAST TECHNIQUE: Multidetector CT imaging of the head, cervical spine, and maxillofacial structures were performed using the standard protocol without intravenous contrast. Multiplanar CT image reconstructions of the cervical spine and maxillofacial structures were also generated. COMPARISON:  06/20/2017 FINDINGS: CT HEAD FINDINGS Brain: No evidence of acute infarction, hemorrhage, hydrocephalus, extra-axial collection or mass lesion/mass effect. Atrophy and chronic small vessel disease. Vascular:  Atherosclerotic calcification. Skull: Right scalp hematoma without underlying fracture. CT MAXILLOFACIAL FINDINGS Osseous: Negative for fracture or mandibular dislocation. Bilateral TMJ degenerative arthropathy Orbits: Bilateral cataract resection. No evidence of injury. Sinuses: No evidence of injury Soft tissues: Swelling about the lower left jaw. No opaque foreign body. CT CERVICAL SPINE FINDINGS Alignment: No traumatic malalignment. C4-5 anterolisthesis, stable and degenerative Skull base and vertebrae: Negative for acute fracture. Remote T2 superior endplate fracture. Soft tissues and spinal canal: No prevertebral fluid or swelling. No visible canal hematoma. Disc levels: Upper cervical predominant degenerative facet spurring. Lower cervical predominant disc degeneration. Upper chest: Right pleural effusion again noted. Other: Right clavicle fracture with ORIF earlier this year. There is bulky callus about the fracture. IMPRESSION: 1. No evidence of acute intracranial or cervical spine injury. 2. Right scalp hematoma without fracture. 3. Negative for facial fracture. Electronically Signed   By: Monte Fantasia M.D.   On: 06/28/2017 07:37   Dg Chest Portable 1 View  Result Date: 06/28/2017 CLINICAL DATA:  84 year old female post fall.  Initial encounter. EXAM: PORTABLE CHEST 1 VIEW COMPARISON:  05/25/2017 chest x-ray.  06/16/2017 chest CT. FINDINGS: Slight increase in size of moderately large right-sided pleural effusion. Persistent consolidation right middle lobe and right lung base. Cardiomegaly with biventricular pacer in place. Right central line tip caval atrial junction. No pneumothorax. Calcified tortuous aorta. Remote right clavicle fracture with angulation of fracture fragments and prominent callus formation. Clavicle plate and screws in place. No new fracture noted. IMPRESSION: Slight increase in size of moderately large right-sided pleural effusion. Persistent consolidation right middle lobe and  right lung base. Cardiomegaly with biventricular pacer in place. Right central line tip caval atrial junction. Aortic Atherosclerosis (ICD10-I70.0). Remote right clavicle fracture with angulation of fracture fragments and prominent callus formation. Clavicle plate and screws in place. Electronically Signed   By: Genia Del M.D.   On: 06/28/2017 08:43   Ct Maxillofacial Wo Contrast  Result Date: 06/28/2017 CLINICAL DATA:  Unwitnessed fall with right frontal trauma. Right facial bruising and neck pain. EXAM: CT HEAD WITHOUT CONTRAST CT MAXILLOFACIAL WITHOUT CONTRAST CT CERVICAL SPINE WITHOUT CONTRAST TECHNIQUE: Multidetector CT imaging of the head, cervical spine, and maxillofacial structures were performed using the standard protocol without intravenous contrast. Multiplanar CT image reconstructions of the cervical spine and maxillofacial structures were also generated. COMPARISON:  06/20/2017 FINDINGS: CT HEAD FINDINGS Brain: No evidence of acute infarction, hemorrhage, hydrocephalus, extra-axial collection or mass lesion/mass effect. Atrophy and chronic small vessel disease. Vascular: Atherosclerotic calcification. Skull: Right scalp hematoma without underlying fracture. CT MAXILLOFACIAL FINDINGS Osseous: Negative for fracture or mandibular dislocation. Bilateral TMJ degenerative arthropathy Orbits: Bilateral cataract resection. No evidence  of injury. Sinuses: No evidence of injury Soft tissues: Swelling about the lower left jaw. No opaque foreign body. CT CERVICAL SPINE FINDINGS Alignment: No traumatic malalignment. C4-5 anterolisthesis, stable and degenerative Skull base and vertebrae: Negative for acute fracture. Remote T2 superior endplate fracture. Soft tissues and spinal canal: No prevertebral fluid or swelling. No visible canal hematoma. Disc levels: Upper cervical predominant degenerative facet spurring. Lower cervical predominant disc degeneration. Upper chest: Right pleural effusion again noted.  Other: Right clavicle fracture with ORIF earlier this year. There is bulky callus about the fracture. IMPRESSION: 1. No evidence of acute intracranial or cervical spine injury. 2. Right scalp hematoma without fracture. 3. Negative for facial fracture. Electronically Signed   By: Monte Fantasia M.D.   On: 06/28/2017 07:37     IMPRESSION AND PLAN:   84 year old female with past medical history of end-stage renal disease on hemodialysis, obstructive sleep apnea, dementia, history of recurrent falls, coronary artery disease, ischemic cardiomyopathy ejection fraction of 20 to 20%, chronic systolic CHF, secondary hyperparathyroidism who presents to the hospital with a fall and noted to have an elevated troponin of 2.9.  1.  Altered mental status/encephalopathy-etiology unclear.  Patient has baseline dementia is somewhat confused but apparently this is worse than her baseline.  Patient CT head and cervical spine is negative for acute pathology. - No evidence of acute infectious pathology.  Will hold off sedative meds and follow mental status. - We will await patient to get hemodialysis to see if that improves her mental status.  2.  Elevated troponin-unclear if this is a non-ST elevation MI versus supply demand ischemia.  Patient is not a good historian therefore denies any chest pain.  EKG shows a paced rhythm with no acute ST or T wave changes. -We will observe her on telemetry, cycle her cardiac markers.  Get echocardiogram get a cardiology consult. -Continue aspirin, beta-blocker.  Patient is apparently intolerant to statins.  3.  End-stage renal disease on hemodialysis-patient gets dialysis on Monday Wednesday and Friday. -I will consult nephrology.  Patient to get dialysis today.  4.  Essential hypertension-continue Toprol.  5.  Hypothyroidism-continue Synthroid.  6.  Secondary hyperparathyroidism-continue Renvela.  7.  History of depression-continue Zoloft.    All the records are  reviewed and case discussed with ED provider. Management plans discussed with the patient, family and they are in agreement.  CODE STATUS: DNR  TOTAL TIME TAKING CARE OF THIS PATIENT: 40 minutes.    Henreitta Leber M.D on 06/28/2017 at 10:38 AM  Between 7am to 6pm - Pager - [phone]  After 6pm go to [email or web address] - password [email or web address] [facility]  [facility]  Office  [phone]  CC: Primary care physician; Kirk Ruths, MD

## 2017-06-28 NOTE — Progress Notes (Signed)
Post HD assessment. Pt tolerated tx well without c/o or complication. Dressing changed to [place]. Net UF 16, goal met.    06/28/17 2240  Vital Signs  Temp (!) 97.1 F (36.2 C)  Temp Source Axillary  Pulse Rate 76  Pulse Rate Source Monitor  Resp 17  BP (!) 142/65  BP Location Left Arm  BP Method Automatic  Patient Position (if appropriate) Lying  Oxygen Therapy  SpO2 100 %  O2 Device Nasal Cannula  O2 Flow Rate (L/min) 1 L/min  Dialysis Weight  Weight 46.2 kg (101 lb 13.6 oz)  Type of Weight Post-Dialysis  Post-Hemodialysis Assessment  Rinseback Volume (mL) 250 mL  KECN 62.7 V  Dialyzer Clearance Lightly streaked  Duration of HD Treatment -hour(s) 3 hour(s)  Hemodialysis Intake (mL) 500 mL  UF Total -Machine (mL) 516 mL  Net UF (mL) 16 mL  Tolerated HD Treatment Yes  Education / Care Plan  Dialysis Education Provided Yes  Documented Education in Care Plan Yes

## 2017-06-28 NOTE — Plan of Care (Signed)
  Problem: Education: Goal: Knowledge of General Education information will improve Outcome: Not Progressing Note:  Patient's neurological status doesn't allow for completion, or even progress, in this goal area for today. Will continue to monitor mentation. Wenda Low [facility]

## 2017-06-28 NOTE — Progress Notes (Signed)
*  PRELIMINARY RESULTS* Echocardiogram 2D Echocardiogram has been performed.  Sharon [name] Sharon [name] 06/28/2017, 8:11 PM

## 2017-06-28 NOTE — ED Notes (Signed)
Attempted to call report. Per Secretary they will call back shortly once they get lower bed for patient.

## 2017-06-28 NOTE — Clinical Social Work Note (Signed)
CSW received consult that patient is from [facility].  CSW contacted [facility] and they said she is a long term care resident at [facility].  Plan is for her to return back to SNF.  CSW to continue to follow to coordinate discharge planning.  Jones Broom. [place], MSW, [place]  06/28/2017 2:59 PM

## 2017-06-28 NOTE — Progress Notes (Signed)
HD tx end   06/28/17 2230  Vital Signs  Pulse Rate 73  Pulse Rate Source Monitor  Resp 17  BP 136/67  BP Location Left Arm  BP Method Automatic  Patient Position (if appropriate) Lying  Oxygen Therapy  SpO2 100 %  O2 Device Nasal Cannula  O2 Flow Rate (L/min) 1 L/min  During Hemodialysis Assessment  Dialysis Fluid Bolus Normal Saline  Bolus Amount (mL) 250 mL  Intra-Hemodialysis Comments Tx completed

## 2017-06-28 NOTE — Progress Notes (Signed)
[facility]  ROUNDING NOTE   Subjective:   [name] admitted to [facility] on 06/28/2017 for [facility] - Fall  Patient lethargic and cannot give a history.   Last hemodialysis treatment was 5/31, Friday.   Objective:  Vital signs in last 24 hours:  Temp:  [97.9 F (36.6 C)] 97.9 F (36.6 C) (06/03 0618) Pulse Rate:  [75-80] 75 (06/03 1200) Resp:  [16-22] 21 (06/03 1200) BP: (131-142)/(70-80) 131/80 (06/03 1200) SpO2:  [94 %-98 %] 95 % (06/03 1200) Weight:  [45.4 kg (100 lb)] 45.4 kg (100 lb) (06/03 0620)  Weight change:  Filed Weights   06/28/17 0620  Weight: 45.4 kg (100 lb)    Intake/Output: No intake/output data recorded.   Intake/Output this shift:  No intake/output data recorded.  Physical Exam: General: NAD, laying in stretcher  Head: Normocephalic, atraumatic. Moist oral mucosal membranes  Eyes: Anicteric, PERRL  Neck: Supple, trachea midline  Lungs:  Clear to auscultation  Heart: Regular rate and rhythm  Abdomen:  Soft, nontender  Extremities: no peripheral edema.  Neurologic: Nonfocal, moving all four extremities  Skin: No lesions  Access: [facility] permcath. Left AVF    Basic Metabolic Panel: Recent Labs  Lab 06/28/17 0906  NA 140  K 5.6*  CL 99*  CO2 20*  GLUCOSE 110*  BUN 73*  CREATININE 5.60*  CALCIUM 9.2    Liver Function Tests: Recent Labs  Lab 06/28/17 0906  AST 130*  ALT 69*  ALKPHOS 179*  BILITOT 3.6*  PROT 7.6  ALBUMIN 3.7   No results for input(s): LIPASE, AMYLASE in the last 168 hours. No results for input(s): AMMONIA in the last 168 hours.  CBC: Recent Labs  Lab 06/28/17 0906  WBC 9.9  NEUTROABS 8.4*  HGB 11.3*  HCT 34.1*  MCV 94.3  PLT 263    Cardiac Enzymes: Recent Labs  Lab 06/28/17 0906  TROPONINI 2.99*    BNP: Invalid input(s): POCBNP  CBG: No results for input(s): GLUCAP in the last 168 hours.  Microbiology: Results for orders placed or performed during the hospital encounter of  05/24/17  Blood culture (routine x 2)     Status: None   Collection Time: 05/24/17  7:08 PM  Result Value Ref Range Status   Specimen Description BLOOD L FA  Final   Special Requests   Final    BOTTLES DRAWN AEROBIC AND ANAEROBIC Blood Culture adequate volume   Culture   Final    NO GROWTH 5 DAYS Performed at [facility], [address], [place], [place] [address]    Report Status 05/29/2017 FINAL  Final  Blood culture (routine x 2)     Status: None   Collection Time: 05/24/17  7:11 PM  Result Value Ref Range Status   Specimen Description BLOOD BLOOD LEFT WRIST  Final   Special Requests   Final    BOTTLES DRAWN AEROBIC AND ANAEROBIC Blood Culture adequate volume   Culture   Final    NO GROWTH 5 DAYS Performed at [facility], [address], [place], [place] [address]    Report Status 05/29/2017 FINAL  Final  MRSA PCR Screening     Status: Abnormal   Collection Time: 05/25/17  5:02 AM  Result Value Ref Range Status   MRSA by PCR POSITIVE (A) NEGATIVE Final    Comment:        The GeneXpert MRSA Assay (FDA approved for NASAL specimens only), is one component of a  comprehensive MRSA colonization surveillance program. It is not intended to diagnose MRSA infection nor to guide or monitor treatment for MRSA infections. RESULT CALLED TO, READ BACK BY AND VERIFIED WITH: MARCELLA TURNER AT [place] ON 05/25/2017 JJB Performed at [facility], [address], [place], [place] [address]   Body fluid culture     Status: None   Collection Time: 05/25/17  3:15 PM  Result Value Ref Range Status   Specimen Description   Final    PLEURAL Performed at [facility], [address] [place], [place] [address]    Special Requests   Final    NONE Performed at [facility] [place], [place] [address]    Gram Stain NO WBC SEEN NO ORGANISMS SEEN   Final   Culture   Final    NO GROWTH 3 DAYS Performed at [facility], [place] [address], [place], [place] [address]    Report Status 05/29/2017 FINAL  Final  Gastrointestinal Panel by PCR , Stool     Status: None   Collection Time: 05/26/17  5:05 PM  Result Value Ref Range Status   Campylobacter species NOT DETECTED NOT DETECTED Final   Plesimonas shigelloides NOT DETECTED NOT DETECTED Final   Salmonella species NOT DETECTED NOT DETECTED Final   Yersinia enterocolitica NOT DETECTED NOT DETECTED Final   Vibrio species NOT DETECTED NOT DETECTED Final   Vibrio cholerae NOT DETECTED NOT DETECTED Final   Enteroaggregative E coli (EAEC) NOT DETECTED NOT DETECTED Final   Enteropathogenic E coli (EPEC) NOT DETECTED NOT DETECTED Final   Enterotoxigenic E coli (ETEC) NOT DETECTED NOT DETECTED Final   Shiga like toxin producing E coli (STEC) NOT DETECTED NOT DETECTED Final   Shigella/Enteroinvasive E coli (EIEC) NOT DETECTED NOT DETECTED Final   Cryptosporidium NOT DETECTED NOT DETECTED Final   Cyclospora cayetanensis NOT DETECTED NOT DETECTED Final   Entamoeba histolytica NOT DETECTED NOT DETECTED Final   Giardia lamblia NOT DETECTED NOT DETECTED Final   Adenovirus F40/41 NOT DETECTED NOT DETECTED Final   Astrovirus NOT DETECTED NOT DETECTED Final   Norovirus GI/GII NOT DETECTED NOT DETECTED Final   Rotavirus A NOT DETECTED NOT DETECTED Final   Sapovirus (I, II, IV, and V) NOT DETECTED NOT DETECTED Final    Comment: Performed at [facility], [address] [place], [place] [address]  C difficile quick scan w PCR reflex     Status: Abnormal   Collection Time: 05/26/17  5:05 PM  Result Value Ref Range Status   C Diff antigen POSITIVE (A) NEGATIVE Final   C Diff toxin NEGATIVE NEGATIVE Final   C Diff interpretation Results are indeterminate. See PCR results.  Final    Comment: Performed at [facility], [address] [place], [place] [address]  C. Diff by PCR, Reflexed     Status: Abnormal   Collection Time: 05/26/17  5:05 PM  Result Value  Ref Range Status   Toxigenic C. Difficile by PCR POSITIVE (A) NEGATIVE Final    Comment: Positive for toxigenic C. difficile with little to no toxin production. Only treat if clinical presentation suggests symptomatic illness. Performed at [facility], [address] [place],  [address]     Coagulation Studies: No results for input(s): LABPROT, INR in the last 72 hours.  Urinalysis: Recent Labs    06/28/17 0906  COLORURINE AMBER*  LABSPEC 1.023  PHURINE 5.0  GLUCOSEU [place]  NEGATIVE  KETONESUR NEGATIVE  PROTEINUR >=300*  NITRITE NEGATIVE  LEUKOCYTESUR NEGATIVE      Imaging: Ct Head Wo Contrast  Result Date: 06/28/2017 CLINICAL DATA:  Unwitnessed fall with right frontal trauma. Right facial bruising and neck pain. EXAM: CT HEAD WITHOUT CONTRAST CT MAXILLOFACIAL WITHOUT CONTRAST CT CERVICAL SPINE WITHOUT CONTRAST TECHNIQUE: Multidetector CT imaging of the head, cervical spine, and maxillofacial structures were performed using the standard protocol without intravenous contrast. Multiplanar CT image reconstructions of the cervical spine and maxillofacial structures were also generated. COMPARISON:  06/20/2017 FINDINGS: CT HEAD FINDINGS Brain: No evidence of acute infarction, hemorrhage, hydrocephalus, extra-axial collection or mass lesion/mass effect. Atrophy and chronic small vessel disease. Vascular: Atherosclerotic calcification. Skull: Right scalp hematoma without underlying fracture. CT MAXILLOFACIAL FINDINGS Osseous: Negative for fracture or mandibular dislocation. Bilateral TMJ degenerative arthropathy Orbits: Bilateral cataract resection. No evidence of injury. Sinuses: No evidence of injury Soft tissues: Swelling about the lower left jaw. No opaque foreign body. CT CERVICAL SPINE FINDINGS Alignment: No traumatic malalignment. C4-5 anterolisthesis, stable and degenerative Skull base and vertebrae: Negative for acute fracture. Remote T2  superior endplate fracture. Soft tissues and spinal canal: No prevertebral fluid or swelling. No visible canal hematoma. Disc levels: Upper cervical predominant degenerative facet spurring. Lower cervical predominant disc degeneration. Upper chest: Right pleural effusion again noted. Other: Right clavicle fracture with ORIF earlier this year. There is bulky callus about the fracture. IMPRESSION: 1. No evidence of acute intracranial or cervical spine injury. 2. Right scalp hematoma without fracture. 3. Negative for facial fracture. Electronically Signed   By: Monte Fantasia M.D.   On: 06/28/2017 07:37   Ct Cervical Spine Wo Contrast  Result Date: 06/28/2017 CLINICAL DATA:  Unwitnessed fall with right frontal trauma. Right facial bruising and neck pain. EXAM: CT HEAD WITHOUT CONTRAST CT MAXILLOFACIAL WITHOUT CONTRAST CT CERVICAL SPINE WITHOUT CONTRAST TECHNIQUE: Multidetector CT imaging of the head, cervical spine, and maxillofacial structures were performed using the standard protocol without intravenous contrast. Multiplanar CT image reconstructions of the cervical spine and maxillofacial structures were also generated. COMPARISON:  06/20/2017 FINDINGS: CT HEAD FINDINGS Brain: No evidence of acute infarction, hemorrhage, hydrocephalus, extra-axial collection or mass lesion/mass effect. Atrophy and chronic small vessel disease. Vascular: Atherosclerotic calcification. Skull: Right scalp hematoma without underlying fracture. CT MAXILLOFACIAL FINDINGS Osseous: Negative for fracture or mandibular dislocation. Bilateral TMJ degenerative arthropathy Orbits: Bilateral cataract resection. No evidence of injury. Sinuses: No evidence of injury Soft tissues: Swelling about the lower left jaw. No opaque foreign body. CT CERVICAL SPINE FINDINGS Alignment: No traumatic malalignment. C4-5 anterolisthesis, stable and degenerative Skull base and vertebrae: Negative for acute fracture. Remote T2 superior endplate fracture. Soft  tissues and spinal canal: No prevertebral fluid or swelling. No visible canal hematoma. Disc levels: Upper cervical predominant degenerative facet spurring. Lower cervical predominant disc degeneration. Upper chest: Right pleural effusion again noted. Other: Right clavicle fracture with ORIF earlier this year. There is bulky callus about the fracture. IMPRESSION: 1. No evidence of acute intracranial or cervical spine injury. 2. Right scalp hematoma without fracture. 3. Negative for facial fracture. Electronically Signed   By: Monte Fantasia M.D.   On: 06/28/2017 07:37   Dg Chest Portable 1 View  Result Date: 06/28/2017 CLINICAL DATA:  84 year old female post fall.  Initial encounter. EXAM: PORTABLE CHEST 1 VIEW COMPARISON:  05/25/2017 chest x-ray.  06/16/2017 chest CT. FINDINGS: Slight increase in size of moderately large right-sided pleural effusion. Persistent consolidation right middle lobe and right lung base. Cardiomegaly  with biventricular pacer in place. Right central line tip caval atrial junction. No pneumothorax. Calcified tortuous aorta. Remote right clavicle fracture with angulation of fracture fragments and prominent callus formation. Clavicle plate and screws in place. No new fracture noted. IMPRESSION: Slight increase in size of moderately large right-sided pleural effusion. Persistent consolidation right middle lobe and right lung base. Cardiomegaly with biventricular pacer in place. Right central line tip caval atrial junction. Aortic Atherosclerosis (ICD10-I70.0). Remote right clavicle fracture with angulation of fracture fragments and prominent callus formation. Clavicle plate and screws in place. Electronically Signed   By: Genia Del M.D.   On: 06/28/2017 08:43   Ct Maxillofacial Wo Contrast  Result Date: 06/28/2017 CLINICAL DATA:  Unwitnessed fall with right frontal trauma. Right facial bruising and neck pain. EXAM: CT HEAD WITHOUT CONTRAST CT MAXILLOFACIAL WITHOUT CONTRAST CT  CERVICAL SPINE WITHOUT CONTRAST TECHNIQUE: Multidetector CT imaging of the head, cervical spine, and maxillofacial structures were performed using the standard protocol without intravenous contrast. Multiplanar CT image reconstructions of the cervical spine and maxillofacial structures were also generated. COMPARISON:  06/20/2017 FINDINGS: CT HEAD FINDINGS Brain: No evidence of acute infarction, hemorrhage, hydrocephalus, extra-axial collection or mass lesion/mass effect. Atrophy and chronic small vessel disease. Vascular: Atherosclerotic calcification. Skull: Right scalp hematoma without underlying fracture. CT MAXILLOFACIAL FINDINGS Osseous: Negative for fracture or mandibular dislocation. Bilateral TMJ degenerative arthropathy Orbits: Bilateral cataract resection. No evidence of injury. Sinuses: No evidence of injury Soft tissues: Swelling about the lower left jaw. No opaque foreign body. CT CERVICAL SPINE FINDINGS Alignment: No traumatic malalignment. C4-5 anterolisthesis, stable and degenerative Skull base and vertebrae: Negative for acute fracture. Remote T2 superior endplate fracture. Soft tissues and spinal canal: No prevertebral fluid or swelling. No visible canal hematoma. Disc levels: Upper cervical predominant degenerative facet spurring. Lower cervical predominant disc degeneration. Upper chest: Right pleural effusion again noted. Other: Right clavicle fracture with ORIF earlier this year. There is bulky callus about the fracture. IMPRESSION: 1. No evidence of acute intracranial or cervical spine injury. 2. Right scalp hematoma without fracture. 3. Negative for facial fracture. Electronically Signed   By: Monte Fantasia M.D.   On: 06/28/2017 07:37     Medications:       Assessment/ Plan:  [name] is a 84 y.o. white female with end stage renal disease on hemodialysis, coronary artery disease, pacemaker placement, meniere's disease, congestive heart failure, peripheral vascular  disease, multiple falls and fractures.   MWF [facility] permcath/left AVF EDW 42.5kg.   1.  End-stage renal disease 2.  Anemia of chronic kidney disease 3.  Secondary hyperparathyroidism 4.  Hypertension  Plan Hemodialysis for later today. Orders prepared.    LOS: 0 Josafat Enrico 6/3/201912:08 PM

## 2017-06-28 NOTE — Consult Note (Signed)
[facility] Card[facility]  CARDIOLOGY CONSULT NOTE  Patient ID: Sharon [name] MRN: [id number] DOB/AGE: 1926-03-08 84 y.o.  Admit date: 06/28/2017 Referring Physician Verdell Carmine Primary Physician [facility] Primary Cardiologist Nehemiah Massed Reason for Consultation Elevated troponin  HPI: 84 year old female referred for evaluation of elevated troponin. The patient is a resident of [facility] with a history of recurrent falls, as well as a history of advanced dementia, chronic systolic heart failure, LVEF 20-25%, end-stage renal disease on dialysis, status post pacemaker insertion, moderate to severe tricuspid regurgitation, obstructive sleep apnea, hypertension, and hyperlipidemia.  The history was obtained from prior notes and the ER note as the patient is not responding to questions. The patient was brought to the emergency room for evaluation after sustaining an unwitnessed fall without apparent loss of consciousness, and apparently noted to be more weak and lethargic this morning. EKG revealed atrial sensed ventricular paced rhythm at a rate of 79 bpm with a chronic left bundle branch block without acute ST or T wave abnormalities.  Admission labs were notable for an elevated troponin of 2.9.  The patient has a history of elevated troponin of 0.12 and 0.10 during prior hospitalizations for falls in May and April 2019.  Per the note, the patient was more alert in the ER this morning, and denied experiencing chest pain. Chest x-ray revealed a slight increase in the size of a moderately large right-sided pleural effusion, persistent consolidation of the right middle lobe and right lung base, and cardiomegaly.  The patient was also noted to have a urinary tract infection.  2D echocardiogram on 01/02/2017 revealed LVEF 20 to 25%, with mild aortic insufficiency, moderate mitral regurgitation, moderate to severe tricuspid regurgitation, with elevated pulmonary pressure of 77 mmHg.  Pacemaker interrogation was performed on  02/02/2017, revealing normal pacemaker function with a longevity of 7 years with underlying complete heart block.   Review of systems not completed at this time as patient is not responding to questions.    Past Medical History:  Diagnosis Date  . Anemia   . Arthritis   . CAD (coronary artery disease)   . Cancer ([place])    skin  . Cardiomyopathy ([place])   . CHF (congestive heart failure) ([place])   . Depression   . IBS (irritable bowel syndrome) 2010  . Kidney failure July 2012   Hemodialysis 3xweek  . Kidney failure   . Meniere disease   . Meniere's disease   . Myocardial infarction ([place])   . OSA (obstructive sleep apnea)    CPAP  . Peritoneal dialysis status ([place])   . Presence of IVC filter 2019  . Presence of permanent cardiac pacemaker   . Renal insufficiency     Past Surgical History:  Procedure Laterality Date  . [place]  . ABDOMINAL HYSTERECTOMY    . ANUS SURGERY  2010  . AV FISTULA PLACEMENT Right 04/15/2016   Procedure: ARTERIOVENOUS (AV) FISTULA CREATION ( RADIOCEPHALIC ) STAGE 2;  Surgeon: Algernon Huxley, MD;  Location: [facility] ORS;  Service: Vascular;  Laterality: Right;  . CATARACT EXTRACTION  2006, 2011  . CHOLECYSTECTOMY  01/2008  . DIALYSIS/PERMA CATHETER INSERTION N/A 01/07/2017   Procedure: DIALYSIS/PERMA CATHETER INSERTION With an IVC filter placement;  Surgeon: Algernon Huxley, MD;  Location: [place] [facility];  Service: Cardiovascular;  Laterality: N/A;  . DIALYSIS/PERMA CATHETER REMOVAL N/A 08/20/2016   Procedure: Dialysis/Perma Catheter Removal;  Surgeon: Algernon Huxley, MD;  Location: [place] [facility];  Service: Cardiovascular;  Laterality: N/A;  . EYE SURGERY     cataracts bilateral  . FEMUR IM NAIL Left 12/15/2015   Procedure: INTRAMEDULLARY (IM) RETROGRADE FEMORAL NAILING;  Surgeon: Oletta Cohn, DO;  Location: [facility] ORS;  Service: Orthopedics;  Laterality: Left;  . HIP PINNING,CANNULATED Left 05/10/2016   Procedure: CANNULATED HIP  PINNING;  Surgeon: Earnestine Leys, MD;  Location: [facility] ORS;  Service: Orthopedics;  Laterality: Left;  . INSERT / REPLACE / REMOVE PACEMAKER    . INSERTION OF DIALYSIS CATHETER  07/2010  . IVC FILTER INSERTION  2019  . JOINT REPLACEMENT     left knee  . MINOR REMOVAL OF PERITONEAL DIALYSIS CATHETER  04/15/2016   Procedure: MINOR REMOVAL OF PERITONEAL DIALYSIS CATHETER;  Surgeon: Algernon Huxley, MD;  Location: [facility] ORS;  Service: Vascular;;  . ORIF CLAVICULAR FRACTURE Right 02/16/2017   Procedure: OPEN REDUCTION INTERNAL FIXATION (ORIF) CLAVICULAR FRACTURE;  Surgeon: Corky Mull, MD;  Location: [facility] ORS;  Service: Orthopedics;  Laterality: Right;  . PACEMAKER INSERTION  2006  . PERIPHERAL VASCULAR CATHETERIZATION N/A 12/18/2015   Procedure: Dialysis/Perma Catheter Insertion;  Surgeon: Katha Cabal, MD;  Location: [place] [facility];  Service: Cardiovascular;  Laterality: N/A;  . TOTAL KNEE ARTHROPLASTY  2008   LEFT/Dr [place]    Medications Prior to Admission  Medication Sig Dispense Refill Last Dose  . acetaminophen (TYLENOL) 325 MG tablet Take 2 tablets (650 mg total) by mouth every 4 (four) hours as needed for mild pain or moderate pain.   Taking  . benzonatate (TESSALON) 200 MG capsule Take 1 capsule (200 mg total) by mouth 3 (three) times daily. 20 capsule 0 Taking  . latanoprost (XALATAN) 0.005 % ophthalmic solution Place 1 drop into both eyes at bedtime.     Marland Kitchen levothyroxine (SYNTHROID, LEVOTHROID) 25 MCG tablet Take 1 tablet (25 mcg total) by mouth daily.   Taking  . metoCLOPramide (REGLAN) 5 MG tablet Take 5 mg by mouth every 8 (eight) hours as needed (for vertigo).     . metoprolol succinate (TOPROL-XL) 25 MG 24 hr tablet Take 1 tablet (25 mg total) by mouth daily.   Taking  . multivitamin (RENA-VIT) TABS tablet Take 1 tablet by mouth daily.   Taking  . sertraline (ZOLOFT) 100 MG tablet Take 50 mg by mouth at bedtime.    Taking  . sevelamer carbonate (RENVELA) 800 MG tablet Take 1  tablet (800 mg total) by mouth 3 (three) times daily with meals. 90 tablet 0 Taking  . traMADol (ULTRAM) 50 MG tablet Take 1-2 tablets (50-100 mg total) by mouth every 6 (six) hours as needed for moderate pain. (Patient taking differently: Take 50 mg by mouth every 6 (six) hours as needed (for mild to moderate pain). ) 30 tablet 0 Taking  . Vitamin D, Ergocalciferol, (DRISDOL) 50000 units CAPS capsule Take 50,000 Units by mouth every Monday.    Taking  . guaiFENesin-dextromethorphan (ROBITUSSIN DM) 100-10 MG/5ML syrup Take 5 mLs by mouth every 4 (four) hours as needed for cough. 118 mL 0 Taking  . hydrocortisone 2.5 % cream Apply topically 3 (three) times daily.   Taking   Social History   Socioeconomic History  . Marital status: Widowed    Spouse name: Not on file  . Number of children: 0  . Years of education: Not on file  . Highest education level: Not on file  Occupational History  . Occupation: Retired   Scientific laboratory technician  . Financial resource strain: Not on file  .  Food insecurity:    Worry: Not on file    Inability: Not on file  . Transportation needs:    Medical: Not on file    Non-medical: Not on file  Tobacco Use  . Smoking status: Former Smoker    Last attempt to quit: 03/31/1948    Years since quitting: 69.2  . Smokeless tobacco: Never Used  Substance and Sexual Activity  . Alcohol use: No  . Drug use: No  . Sexual activity: Never  Lifestyle  . Physical activity:    Days per week: Not on file    Minutes per session: Not on file  . Stress: Not on file  Relationships  . Social connections:    Talks on phone: Not on file    Gets together: Not on file    Attends religious service: Not on file    Active member of club or organization: Not on file    Attends meetings of clubs or organizations: Not on file    Relationship status: Not on file  . Intimate partner violence:    Fear of current or ex partner: Not on file    Emotionally abused: Not on file    Physically  abused: Not on file    Forced sexual activity: Not on file  Other Topics Concern  . Not on file  Social History Narrative   Lives in [place] alone. Widow 2012.      Regular Exercise -  Housework   Daily Caffeine Use:  1 - 2 cups coffee             Family History  Problem Relation Age of Onset  . Cancer Mother        ? ovarian - sarcoma  . Cancer Father        Skin cancer  . Diabetes Sister   . COPD Sister   . Depression Sister   . Cancer Sister        Lung - 42 yrs old      Review of systems complete and found to be negative unless listed above      PHYSICAL EXAM  General: Frail, elderly female lying flat in bed on her side, sleeping, with diffuse ecchymoses noted on face, arms, legs, upper chest HEENT: Ecchymoses on face, normocephalic. Neck:  No JVD.  Lungs: Normal effort of breathing on room air. No wheezing Heart: HRRR . Normal S1, S2  Chest: PermCath in place left chest Abdomen: Bowel sounds are positive, abdomen soft  Msk: Muscular atrophy throughout; gait not assessed Extremities: Cyanosis of left hand, no edema. AV fistula right upper extremity Neuro: Demented Psych:  Demented  Labs:   Lab Results  Component Value Date   WBC 9.9 06/28/2017   HGB 11.3 (L) 06/28/2017   HCT 34.1 (L) 06/28/2017   MCV 94.3 06/28/2017   PLT 263 06/28/2017    Recent Labs  Lab 06/28/17 0906  NA 140  K 5.6*  CL 99*  CO2 20*  BUN 73*  CREATININE 5.60*  CALCIUM 9.2  PROT 7.6  BILITOT 3.6*  ALKPHOS 179*  ALT 69*  AST 130*  GLUCOSE 110*   Lab Results  Component Value Date   CKTOTAL 71 04/21/2017   TROPONINI 2.99 (HH) 06/28/2017    Lab Results  Component Value Date   CHOL 283 (A) 09/03/2014   Lab Results  Component Value Date   HDL 72 (A) 09/03/2014   Lab Results  Component Value Date   LDLCALC 173 09/03/2014  Lab Results  Component Value Date   TRIG 190 (A) 09/03/2014   No results found for: CHOLHDL No results found for: LDLDIRECT     Radiology: Ct Head Wo Contrast  Result Date: 06/28/2017 CLINICAL DATA:  Unwitnessed fall with right frontal trauma. Right facial bruising and neck pain. EXAM: CT HEAD WITHOUT CONTRAST CT MAXILLOFACIAL WITHOUT CONTRAST CT CERVICAL SPINE WITHOUT CONTRAST TECHNIQUE: Multidetector CT imaging of the head, cervical spine, and maxillofacial structures were performed using the standard protocol without intravenous contrast. Multiplanar CT image reconstructions of the cervical spine and maxillofacial structures were also generated. COMPARISON:  06/20/2017 FINDINGS: CT HEAD FINDINGS Brain: No evidence of acute infarction, hemorrhage, hydrocephalus, extra-axial collection or mass lesion/mass effect. Atrophy and chronic small vessel disease. Vascular: Atherosclerotic calcification. Skull: Right scalp hematoma without underlying fracture. CT MAXILLOFACIAL FINDINGS Osseous: Negative for fracture or mandibular dislocation. Bilateral TMJ degenerative arthropathy Orbits: Bilateral cataract resection. No evidence of injury. Sinuses: No evidence of injury Soft tissues: Swelling about the lower left jaw. No opaque foreign body. CT CERVICAL SPINE FINDINGS Alignment: No traumatic malalignment. C4-5 anterolisthesis, stable and degenerative Skull base and vertebrae: Negative for acute fracture. Remote T2 superior endplate fracture. Soft tissues and spinal canal: No prevertebral fluid or swelling. No visible canal hematoma. Disc levels: Upper cervical predominant degenerative facet spurring. Lower cervical predominant disc degeneration. Upper chest: Right pleural effusion again noted. Other: Right clavicle fracture with ORIF earlier this year. There is bulky callus about the fracture. IMPRESSION: 1. No evidence of acute intracranial or cervical spine injury. 2. Right scalp hematoma without fracture. 3. Negative for facial fracture. Electronically Signed   By: Monte Fantasia M.D.   On: 06/28/2017 07:37   Ct Head Wo Contrast  Result  Date: 06/20/2017 CLINICAL DATA:  Several recent falls EXAM: CT HEAD WITHOUT CONTRAST CT CERVICAL SPINE WITHOUT CONTRAST TECHNIQUE: Multidetector CT imaging of the head and cervical spine was performed following the standard protocol without intravenous contrast. Multiplanar CT image reconstructions of the cervical spine were also generated. COMPARISON:  CT head and CT cervical spine Jun 16, 2017 FINDINGS: CT HEAD FINDINGS Brain: Mild diffuse atrophy is stable. There is no intracranial mass, hemorrhage, extra-axial fluid collection, or midline shift. There is patchy small vessel disease throughout the centra semiovale bilaterally, stable. No evident acute infarct. Vascular: No hyperdense vessel. There is calcification in each distal vertebral artery and in each carotid siphon. Skull: Scalp hematomas along the right frontal and temporal regions are again noted. Scalp hematoma to the left of midline in the frontal region is smaller compared to recent study. Bony calvarium appears intact. Sinuses/Orbits: There is mucosal thickening in several ethmoid air cells. Other visualized paranasal sinuses are clear. Visualized orbits appear symmetric bilaterally. Other: Mastoid air cells are clear. CT CERVICAL SPINE FINDINGS Alignment: There is 1 mm of anterolisthesis of C3 on C4. There is 1 mm of anterolisthesis of C4 on C5. There is 1 mm of retrolisthesis of C5 on C6. There is 2 mm of retrolisthesis of C6 on C7. Skull base and vertebrae: The skull base and craniocervical junction regions appear normal. Bones are osteoporotic. No fracture is evident. No blastic or lytic bone lesions are evident. Soft tissues and spinal canal: Prevertebral soft tissues and predental space regions are normal. No paraspinous lesion. No evident cord or canal hematoma. Disc levels: There is moderately severe disc space narrowing at C5-6 and C6-7. There is slightly milder disc space narrowing at C4-5. There is facet osteoarthritic change at multiple  levels  bilaterally. There is exit foraminal narrowing on the left due to bony hypertrophy with impression on the exiting nerve root due to bony hypertrophy at this level. Similar changes are noted at C5-6 bilaterally and at C6-7 bilaterally. No frank disc extrusion or high-grade stenosis evident. Upper chest: There is a sizable free-flowing pleural effusion on the right. Upper lung zones elsewhere clear. Other: There is calcification in each subclavian and carotid artery. There is a nodular lesion in the right lobe of the thyroid measuring 1.5 x 1.5 cm. IMPRESSION: CT head: Stable atrophy with supratentorial small vessel disease. No acute infarct. No mass or hemorrhage. Scalp hematomas are stable on the right and significantly smaller to the left of midline in the frontal region. No fracture evident. Foci of arterial vascular calcification noted. Mucosal thickening noted in several ethmoid air cells. CT cervical spine: No fracture. Areas of mild spondylolisthesis appear due to underlying spondylosis. There is multilevel arthropathy. Sizable free-flowing pleural effusion on the right. Foci of vascular calcification in subclavian and carotid arteries bilaterally. **An incidental finding of potential clinical significance has been found. 1.5 x 1.5 cm right thyroid nodular lesion. Consider further evaluation with thyroid ultrasound nonemergently. If patient is clinically hyperthyroid, consider nuclear medicine thyroid uptake and scan.** Electronically Signed   By: Lowella Grip III M.D.   On: 06/20/2017 19:57   Ct Head Wo Contrast  Result Date: 06/16/2017 CLINICAL DATA:  Fall with hematoma to forehead. EXAM: CT HEAD WITHOUT CONTRAST CT CERVICAL SPINE WITHOUT CONTRAST TECHNIQUE: Multidetector CT imaging of the head and cervical spine was performed following the standard protocol without intravenous contrast. Multiplanar CT image reconstructions of the cervical spine were also generated. COMPARISON:  CT of the  head on 06/11/2017 and CT of the cervical spine on 04/21/2017. FINDINGS: CT HEAD FINDINGS Brain: Stable small vessel disease in the periventricular white matter. The brain demonstrates no evidence of acute hemorrhage, infarction, edema, mass effect, extra-axial fluid collection, hydrocephalus or mass lesion. Vascular: No hyperdense vessel or unexpected calcification. Skull: Recent right parietal scalp hematoma is smaller in size. There is a new right and midline frontal scalp hematoma present. No underlying acute skull fracture is identified. Sinuses/Orbits: No acute finding. Other: None. CT CERVICAL SPINE FINDINGS Alignment: Normal alignment without subluxation. Skull base and vertebrae: No evidence of cervical spine fracture. No bony lesions or destruction identified. Soft tissues and spinal canal: No prevertebral soft tissue swelling. Disc levels: Stable degenerative disc disease at C5-6 and C6-7. Mild degenerative disc disease at C4-5. Upper chest: Right pleural fluid seen at the apex. IMPRESSION: 1. New right frontal and midline scalp hematoma without skull fracture or acute intracranial findings. The recent right parietal scalp hematoma is smaller in size. 2. No evidence of acute cervical spine injury. Stable degenerative disc disease. 3. Right-sided pleural effusion seen in the upper right hemithorax. Electronically Signed   By: Aletta Edouard M.D.   On: 06/16/2017 18:32   Ct Head Wo Contrast  Result Date: 06/11/2017 CLINICAL DATA:  Unwitnessed fall. EXAM: CT HEAD WITHOUT CONTRAST TECHNIQUE: Contiguous axial images were obtained from the base of the skull through the vertex without intravenous contrast. COMPARISON:  April 21, 2017 FINDINGS: Brain: No subdural, epidural, or subarachnoid hemorrhage. Cerebellum, brainstem, and basal cisterns are normal. Ventricles and sulci are normal. White matter changes persist. No acute cortical ischemia or infarct. No mass effect or midline shift. Vascular: Calcified  atherosclerosis is seen in the intracranial carotids. Skull: Normal. Negative for fracture or focal lesion. Sinuses/Orbits: No  acute finding. Other: Hematoma over the right lateral scalp. Extracranial soft tissues are otherwise normal. IMPRESSION: 1. Hematoma over the right lateral scalp. No underlying fracture identified. 2. No acute intracranial abnormality identified. Chronic white matter changes. Electronically Signed   By: Dorise Bullion III M.D   On: 06/11/2017 11:55   Ct Chest Wo Contrast  Result Date: 06/16/2017 CLINICAL DATA:  Status post multiple falls with right shoulder pain and scattered bruising. EXAM: CT CHEST WITHOUT CONTRAST TECHNIQUE: Multidetector CT imaging of the chest was performed following the standard protocol without IV contrast. COMPARISON:  Chest CT dated 01/01/2017 FINDINGS: Cardiovascular: Enlarged heart. Biventricular pacemaker in place. No evidence of pericardial effusion. Calcific atherosclerotic disease of the coronary arteries and aorta. Mediastinum/Nodes: No enlarged mediastinal or axillary lymph nodes. Thyroid gland, trachea, and esophagus demonstrate no significant findings. Lungs/Pleura: Large layering right pleural effusion with compressive atelectasis of the right lower lobe and near complete collapse of the right middle lobe. Upper Abdomen: Atrophic kidneys.  Vascular calcifications. Musculoskeletal: There has been a prior side plate and screw fixation through distal right clavicular fracture. The screws are anchored within the distal fracture fragment and posttraumatic callus. There is stable deformity with significant angulation at the proximal fracture site. There is longitudinal linear lucency through the proximal right clavicle with scant overlying callus formation, which likely represents a nonunion fracture. No evidence of spinal rib fractures. There is a healing or healed sternal body fracture. IMPRESSION: Large layering right pleural effusion with compressive  atelectasis of the right lower lobe and near complete collapse of the right middle lobe. Enlarged heart. Calcific atherosclerotic disease of the coronary arteries and aorta. Sideplate and screw fixation of prior comminuted right clavicular fracture with screws which are anchored to the distal fracture fragment and callus. Sharp angulation at the proximal fracture line, which appears stable from the radiograph dated 05/25/2017. Healing or healed sternal body fracture. No evidence of acute injury. Aortic Atherosclerosis (ICD10-I70.0). Electronically Signed   By: Fidela Salisbury M.D.   On: 06/16/2017 18:43   Ct Cervical Spine Wo Contrast  Result Date: 06/28/2017 CLINICAL DATA:  Unwitnessed fall with right frontal trauma. Right facial bruising and neck pain. EXAM: CT HEAD WITHOUT CONTRAST CT MAXILLOFACIAL WITHOUT CONTRAST CT CERVICAL SPINE WITHOUT CONTRAST TECHNIQUE: Multidetector CT imaging of the head, cervical spine, and maxillofacial structures were performed using the standard protocol without intravenous contrast. Multiplanar CT image reconstructions of the cervical spine and maxillofacial structures were also generated. COMPARISON:  06/20/2017 FINDINGS: CT HEAD FINDINGS Brain: No evidence of acute infarction, hemorrhage, hydrocephalus, extra-axial collection or mass lesion/mass effect. Atrophy and chronic small vessel disease. Vascular: Atherosclerotic calcification. Skull: Right scalp hematoma without underlying fracture. CT MAXILLOFACIAL FINDINGS Osseous: Negative for fracture or mandibular dislocation. Bilateral TMJ degenerative arthropathy Orbits: Bilateral cataract resection. No evidence of injury. Sinuses: No evidence of injury Soft tissues: Swelling about the lower left jaw. No opaque foreign body. CT CERVICAL SPINE FINDINGS Alignment: No traumatic malalignment. C4-5 anterolisthesis, stable and degenerative Skull base and vertebrae: Negative for acute fracture. Remote T2 superior endplate fracture.  Soft tissues and spinal canal: No prevertebral fluid or swelling. No visible canal hematoma. Disc levels: Upper cervical predominant degenerative facet spurring. Lower cervical predominant disc degeneration. Upper chest: Right pleural effusion again noted. Other: Right clavicle fracture with ORIF earlier this year. There is bulky callus about the fracture. IMPRESSION: 1. No evidence of acute intracranial or cervical spine injury. 2. Right scalp hematoma without fracture. 3. Negative for facial fracture. Electronically Signed  By: Monte Fantasia M.D.   On: 06/28/2017 07:37   Ct Cervical Spine Wo Contrast  Result Date: 06/20/2017 CLINICAL DATA:  Several recent falls EXAM: CT HEAD WITHOUT CONTRAST CT CERVICAL SPINE WITHOUT CONTRAST TECHNIQUE: Multidetector CT imaging of the head and cervical spine was performed following the standard protocol without intravenous contrast. Multiplanar CT image reconstructions of the cervical spine were also generated. COMPARISON:  CT head and CT cervical spine Jun 16, 2017 FINDINGS: CT HEAD FINDINGS Brain: Mild diffuse atrophy is stable. There is no intracranial mass, hemorrhage, extra-axial fluid collection, or midline shift. There is patchy small vessel disease throughout the centra semiovale bilaterally, stable. No evident acute infarct. Vascular: No hyperdense vessel. There is calcification in each distal vertebral artery and in each carotid siphon. Skull: Scalp hematomas along the right frontal and temporal regions are again noted. Scalp hematoma to the left of midline in the frontal region is smaller compared to recent study. Bony calvarium appears intact. Sinuses/Orbits: There is mucosal thickening in several ethmoid air cells. Other visualized paranasal sinuses are clear. Visualized orbits appear symmetric bilaterally. Other: Mastoid air cells are clear. CT CERVICAL SPINE FINDINGS Alignment: There is 1 mm of anterolisthesis of C3 on C4. There is 1 mm of anterolisthesis of  C4 on C5. There is 1 mm of retrolisthesis of C5 on C6. There is 2 mm of retrolisthesis of C6 on C7. Skull base and vertebrae: The skull base and craniocervical junction regions appear normal. Bones are osteoporotic. No fracture is evident. No blastic or lytic bone lesions are evident. Soft tissues and spinal canal: Prevertebral soft tissues and predental space regions are normal. No paraspinous lesion. No evident cord or canal hematoma. Disc levels: There is moderately severe disc space narrowing at C5-6 and C6-7. There is slightly milder disc space narrowing at C4-5. There is facet osteoarthritic change at multiple levels bilaterally. There is exit foraminal narrowing on the left due to bony hypertrophy with impression on the exiting nerve root due to bony hypertrophy at this level. Similar changes are noted at C5-6 bilaterally and at C6-7 bilaterally. No frank disc extrusion or high-grade stenosis evident. Upper chest: There is a sizable free-flowing pleural effusion on the right. Upper lung zones elsewhere clear. Other: There is calcification in each subclavian and carotid artery. There is a nodular lesion in the right lobe of the thyroid measuring 1.5 x 1.5 cm. IMPRESSION: CT head: Stable atrophy with supratentorial small vessel disease. No acute infarct. No mass or hemorrhage. Scalp hematomas are stable on the right and significantly smaller to the left of midline in the frontal region. No fracture evident. Foci of arterial vascular calcification noted. Mucosal thickening noted in several ethmoid air cells. CT cervical spine: No fracture. Areas of mild spondylolisthesis appear due to underlying spondylosis. There is multilevel arthropathy. Sizable free-flowing pleural effusion on the right. Foci of vascular calcification in subclavian and carotid arteries bilaterally. **An incidental finding of potential clinical significance has been found. 1.5 x 1.5 cm right thyroid nodular lesion. Consider further evaluation  with thyroid ultrasound nonemergently. If patient is clinically hyperthyroid, consider nuclear medicine thyroid uptake and scan.** Electronically Signed   By: Lowella Grip III M.D.   On: 06/20/2017 19:57   Ct Cervical Spine Wo Contrast  Result Date: 06/16/2017 CLINICAL DATA:  Fall with hematoma to forehead. EXAM: CT HEAD WITHOUT CONTRAST CT CERVICAL SPINE WITHOUT CONTRAST TECHNIQUE: Multidetector CT imaging of the head and cervical spine was performed following the standard protocol without intravenous contrast.  Multiplanar CT image reconstructions of the cervical spine were also generated. COMPARISON:  CT of the head on 06/11/2017 and CT of the cervical spine on 04/21/2017. FINDINGS: CT HEAD FINDINGS Brain: Stable small vessel disease in the periventricular white matter. The brain demonstrates no evidence of acute hemorrhage, infarction, edema, mass effect, extra-axial fluid collection, hydrocephalus or mass lesion. Vascular: No hyperdense vessel or unexpected calcification. Skull: Recent right parietal scalp hematoma is smaller in size. There is a new right and midline frontal scalp hematoma present. No underlying acute skull fracture is identified. Sinuses/Orbits: No acute finding. Other: None. CT CERVICAL SPINE FINDINGS Alignment: Normal alignment without subluxation. Skull base and vertebrae: No evidence of cervical spine fracture. No bony lesions or destruction identified. Soft tissues and spinal canal: No prevertebral soft tissue swelling. Disc levels: Stable degenerative disc disease at C5-6 and C6-7. Mild degenerative disc disease at C4-5. Upper chest: Right pleural fluid seen at the apex. IMPRESSION: 1. New right frontal and midline scalp hematoma without skull fracture or acute intracranial findings. The recent right parietal scalp hematoma is smaller in size. 2. No evidence of acute cervical spine injury. Stable degenerative disc disease. 3. Right-sided pleural effusion seen in the upper right  hemithorax. Electronically Signed   By: Aletta Edouard M.D.   On: 06/16/2017 18:32   Ct T-spine No Charge  Result Date: 06/16/2017 CLINICAL DATA:  Fall today. EXAM: CT THORACIC SPINE WITHOUT CONTRAST TECHNIQUE: Multidetector CT images of the thoracic were obtained using the standard protocol without intravenous contrast. COMPARISON:  None. FINDINGS: Alignment: The thoracic spine shows normal alignment. Vertebrae: No evidence of fracture.  No bony lesions. Paraspinal and other soft tissues: No paravertebral soft tissue abnormalities. Disc levels: Minimal degenerative disc disease and osteophyte formation. Moderate to large right pleural effusion present. IMPRESSION: No evidence of acute thoracic spine injury. Moderate to large right pleural effusion. Electronically Signed   By: Aletta Edouard M.D.   On: 06/16/2017 18:36   Dg Chest Portable 1 View  Result Date: 06/28/2017 CLINICAL DATA:  84 year old female post fall.  Initial encounter. EXAM: PORTABLE CHEST 1 VIEW COMPARISON:  05/25/2017 chest x-ray.  06/16/2017 chest CT. FINDINGS: Slight increase in size of moderately large right-sided pleural effusion. Persistent consolidation right middle lobe and right lung base. Cardiomegaly with biventricular pacer in place. Right central line tip caval atrial junction. No pneumothorax. Calcified tortuous aorta. Remote right clavicle fracture with angulation of fracture fragments and prominent callus formation. Clavicle plate and screws in place. No new fracture noted. IMPRESSION: Slight increase in size of moderately large right-sided pleural effusion. Persistent consolidation right middle lobe and right lung base. Cardiomegaly with biventricular pacer in place. Right central line tip caval atrial junction. Aortic Atherosclerosis (ICD10-I70.0). Remote right clavicle fracture with angulation of fracture fragments and prominent callus formation. Clavicle plate and screws in place. Electronically Signed   By: Genia Del M.D.   On: 06/28/2017 08:43   Ct Maxillofacial Wo Contrast  Result Date: 06/28/2017 CLINICAL DATA:  Unwitnessed fall with right frontal trauma. Right facial bruising and neck pain. EXAM: CT HEAD WITHOUT CONTRAST CT MAXILLOFACIAL WITHOUT CONTRAST CT CERVICAL SPINE WITHOUT CONTRAST TECHNIQUE: Multidetector CT imaging of the head, cervical spine, and maxillofacial structures were performed using the standard protocol without intravenous contrast. Multiplanar CT image reconstructions of the cervical spine and maxillofacial structures were also generated. COMPARISON:  06/20/2017 FINDINGS: CT HEAD FINDINGS Brain: No evidence of acute infarction, hemorrhage, hydrocephalus, extra-axial collection or mass lesion/mass effect. Atrophy and chronic small  vessel disease. Vascular: Atherosclerotic calcification. Skull: Right scalp hematoma without underlying fracture. CT MAXILLOFACIAL FINDINGS Osseous: Negative for fracture or mandibular dislocation. Bilateral TMJ degenerative arthropathy Orbits: Bilateral cataract resection. No evidence of injury. Sinuses: No evidence of injury Soft tissues: Swelling about the lower left jaw. No opaque foreign body. CT CERVICAL SPINE FINDINGS Alignment: No traumatic malalignment. C4-5 anterolisthesis, stable and degenerative Skull base and vertebrae: Negative for acute fracture. Remote T2 superior endplate fracture. Soft tissues and spinal canal: No prevertebral fluid or swelling. No visible canal hematoma. Disc levels: Upper cervical predominant degenerative facet spurring. Lower cervical predominant disc degeneration. Upper chest: Right pleural effusion again noted. Other: Right clavicle fracture with ORIF earlier this year. There is bulky callus about the fracture. IMPRESSION: 1. No evidence of acute intracranial or cervical spine injury. 2. Right scalp hematoma without fracture. 3. Negative for facial fracture. Electronically Signed   By: Monte Fantasia M.D.   On: 06/28/2017 07:37     EKG: AS-VP rhythm, rate 79 bpm  ASSESSMENT AND PLAN:  1. Elevated troponin, incidentally noted to be 2.99, in an elderly patient with ESRD on dialysis and advanced dementia, in the absence of apparent chest pain or acute ECG changes.  2. Chronic systolic congestive heart failure with LVEF 20-25% per echocardiogram 12/2016. 3. ESRD on dialysis 3. Altered mental status/encephalopathy with history of dementia 4. History of recurrent falls  Plan: 1. Agree with overall plan. 2. Conservative measures at this time; defer cardiac catheterization in the setting of advanced dementia, with no apparent chest pain. 3. Continue to trend troponin 4. Continue metoprolol succinate 5. Review echocardiogram   Signed: Clabe Seal, PA-C 06/28/2017, 12:57 PM  The exam findings and history were discussed with Dr. Saralyn Pilar and the plan was made in collaboration with him.

## 2017-06-28 NOTE — ED Triage Notes (Signed)
DMS pt to rm 19 from [facility] with report of a fall. Pt denies LOC. States she called  For help but they did not get there in time. Pt has bruising over both arms, most of her trunk and her face. More bruising to the right side of her body than the left. Pt also has bruising to her knees.

## 2017-06-29 ENCOUNTER — Encounter (INDEPENDENT_AMBULATORY_CARE_PROVIDER_SITE_OTHER): Payer: Medicare Other

## 2017-06-29 ENCOUNTER — Ambulatory Visit (INDEPENDENT_AMBULATORY_CARE_PROVIDER_SITE_OTHER): Payer: Medicare Other | Admitting: Vascular Surgery

## 2017-06-29 LAB — BASIC METABOLIC PANEL
ANION GAP: 14 (ref 5–15)
BUN: 35 mg/dL — ABNORMAL HIGH (ref 6–20)
CHLORIDE: 97 mmol/L — AB (ref 101–111)
CO2: 27 mmol/L (ref 22–32)
Calcium: 8.3 mg/dL — ABNORMAL LOW (ref 8.9–10.3)
Creatinine, Ser: 3.01 mg/dL — ABNORMAL HIGH (ref 0.44–1.00)
GFR calc non Af Amer: 13 mL/min — ABNORMAL LOW (ref >60)
GFR, EST AFRICAN AMERICAN: 15 mL/min — AB (ref >60)
Glucose, Bld: 71 mg/dL (ref 65–99)
POTASSIUM: 3.7 mmol/L (ref 3.5–5.1)
Sodium: 138 mmol/L (ref 135–145)

## 2017-06-29 LAB — BLOOD GAS, ARTERIAL
ALLENS TEST (PASS/FAIL): POSITIVE — AB
Acid-Base Excess: 4.6 mmol/L — ABNORMAL HIGH (ref 0.0–2.0)
Bicarbonate: 29.8 mmol/L — ABNORMAL HIGH (ref 20.0–28.0)
FIO2: 24
O2 SAT: 95 %
PCO2 ART: 46 mmHg (ref 32.0–48.0)
PH ART: 7.42 (ref 7.350–7.450)
Patient temperature: 37
pO2, Arterial: 74 mmHg — ABNORMAL LOW (ref 83.0–108.0)

## 2017-06-29 LAB — ECHOCARDIOGRAM COMPLETE
Height: 64 in
WEIGHTICAEL: 1600 [oz_av]

## 2017-06-29 LAB — CBC
HEMATOCRIT: 33 % — AB (ref 35.0–47.0)
Hemoglobin: 10.7 g/dL — ABNORMAL LOW (ref 12.0–16.0)
MCH: 30.7 pg (ref 26.0–34.0)
MCHC: 32.5 g/dL (ref 32.0–36.0)
MCV: 94.7 fL (ref 80.0–100.0)
Platelets: 245 10*3/uL (ref 150–440)
RBC: 3.48 MIL/uL — AB (ref 3.80–5.20)
RDW: 21.5 % — ABNORMAL HIGH (ref 11.5–14.5)
WBC: 8.3 10*3/uL (ref 3.6–11.0)

## 2017-06-29 LAB — PROCALCITONIN: Procalcitonin: 0.64 ng/mL

## 2017-06-29 MED ORDER — MUPIROCIN CALCIUM 2 % EX CREA
TOPICAL_CREAM | Freq: Every day | CUTANEOUS | Status: DC
  Administered 2017-06-29 – 2017-07-01 (×3): via TOPICAL
  Filled 2017-06-29: qty 15

## 2017-06-29 MED ORDER — CHLORHEXIDINE GLUCONATE CLOTH 2 % EX PADS
6.0000 | MEDICATED_PAD | Freq: Every day | CUTANEOUS | Status: DC
  Administered 2017-06-30 – 2017-07-01 (×2): 6 via TOPICAL

## 2017-06-29 MED ORDER — ASCORBIC ACID 500 MG/ML IJ SOLN
250.0000 mg | Freq: Two times a day (BID) | INTRAMUSCULAR | Status: DC
  Administered 2017-06-29 – 2017-06-30 (×2): 250 mg via INTRAVENOUS
  Filled 2017-06-29 (×14): qty 0.5

## 2017-06-29 MED ORDER — SERTRALINE HCL 50 MG PO TABS
50.0000 mg | ORAL_TABLET | Freq: Every day | ORAL | Status: DC
  Administered 2017-06-29 – 2017-06-30 (×2): 50 mg via ORAL
  Filled 2017-06-29 (×2): qty 1

## 2017-06-29 NOTE — Progress Notes (Signed)
[facility]  ROUNDING NOTE   Subjective:   Hemodialysis treatment last night. Minimal ultrafiltration.   Confused this morning  Objective:  Vital signs in last 24 hours:  Temp:  [97.1 F (36.2 C)-98.3 F (36.8 C)] 98 F (36.7 C) (06/04 0749) Pulse Rate:  [68-83] 68 (06/04 0749) Resp:  [13-22] 16 (06/04 0749) BP: (121-142)/(54-80) 125/70 (06/04 0749) SpO2:  [90 %-100 %] 97 % (06/04 0749) Weight:  [36.6 kg (80 lb 11.2 oz)-46.3 kg (102 lb)] 36.6 kg (80 lb 11.2 oz) (06/04 0345)  Weight change: 0.907 kg (2 lb) Filed Weights   06/28/17 1918 06/28/17 2240 06/29/17 0345  Weight: 46.3 kg (102 lb) 46.2 kg (101 lb 13.6 oz) 36.6 kg (80 lb 11.2 oz)    Intake/Output: I/O last 3 completed shifts: In: -  Out: 16 [Other:16]   Intake/Output this shift:  No intake/output data recorded.  Physical Exam: General: NAD, laying in bed  Head: Normocephalic, atraumatic. Moist oral mucosal membranes  Eyes: Anicteric, PERRL  Neck: Supple, trachea midline  Lungs:  Clear to auscultation  Heart: Regular rate and rhythm  Abdomen:  Soft, nontender  Extremities: no peripheral edema.  Neurologic: No alert or oriented  Skin: No lesions  Access: [facility] permcath. Left AVF    Basic Metabolic Panel: Recent Labs  Lab 06/28/17 0906 06/29/17 0719  NA 140 138  K 5.6* 3.7  CL 99* 97*  CO2 20* 27  GLUCOSE 110* 71  BUN 73* 35*  CREATININE 5.60* 3.01*  CALCIUM 9.2 8.3*    Liver Function Tests: Recent Labs  Lab 06/28/17 0906  AST 130*  ALT 69*  ALKPHOS 179*  BILITOT 3.6*  PROT 7.6  ALBUMIN 3.7   No results for input(s): LIPASE, AMYLASE in the last 168 hours. No results for input(s): AMMONIA in the last 168 hours.  CBC: Recent Labs  Lab 06/28/17 0906 06/28/17 1652 06/29/17 0719  WBC 9.9 8.6 8.3  NEUTROABS 8.4*  --   --   HGB 11.3* 11.5* 10.7*  HCT 34.1* 35.0 33.0*  MCV 94.3 94.9 94.7  PLT 263 246 245    Cardiac Enzymes: Recent Labs  Lab 06/28/17 0906  06/28/17 1259 06/28/17 1652 06/28/17 2023  TROPONINI 2.99* 2.64* 3.00* 2.96*    BNP: Invalid input(s): POCBNP  CBG: No results for input(s): GLUCAP in the last 168 hours.  Microbiology: Results for orders placed or performed during the hospital encounter of 06/28/17  MRSA PCR Screening     Status: None   Collection Time: 06/28/17  1:58 PM  Result Value Ref Range Status   MRSA by PCR NEGATIVE NEGATIVE Final    Comment:        The GeneXpert MRSA Assay (FDA approved for NASAL specimens only), is one component of a comprehensive MRSA colonization surveillance program. It is not intended to diagnose MRSA infection nor to guide or monitor treatment for MRSA infections. Performed at [facility], [address] [place], [place] [address]     Coagulation Studies: No results for input(s): LABPROT, INR in the last 72 hours.  Urinalysis: Recent Labs    06/28/17 0906  COLORURINE AMBER*  LABSPEC 1.023  PHURINE 5.0  GLUCOSEU 50*  HGBUR NEGATIVE  BILIRUBINUR NEGATIVE  KETONESUR NEGATIVE  PROTEINUR >=300*  NITRITE NEGATIVE  LEUKOCYTESUR NEGATIVE      Imaging: Ct Head Wo Contrast  Result Date: 06/28/2017 CLINICAL DATA:  Unwitnessed fall with right frontal trauma. Right facial bruising and neck pain. EXAM: CT HEAD WITHOUT CONTRAST  CT MAXILLOFACIAL WITHOUT CONTRAST CT CERVICAL SPINE WITHOUT CONTRAST TECHNIQUE: Multidetector CT imaging of the head, cervical spine, and maxillofacial structures were performed using the standard protocol without intravenous contrast. Multiplanar CT image reconstructions of the cervical spine and maxillofacial structures were also generated. COMPARISON:  06/20/2017 FINDINGS: CT HEAD FINDINGS Brain: No evidence of acute infarction, hemorrhage, hydrocephalus, extra-axial collection or mass lesion/mass effect. Atrophy and chronic small vessel disease. Vascular: Atherosclerotic calcification. Skull: Right scalp hematoma without underlying  fracture. CT MAXILLOFACIAL FINDINGS Osseous: Negative for fracture or mandibular dislocation. Bilateral TMJ degenerative arthropathy Orbits: Bilateral cataract resection. No evidence of injury. Sinuses: No evidence of injury Soft tissues: Swelling about the lower left jaw. No opaque foreign body. CT CERVICAL SPINE FINDINGS Alignment: No traumatic malalignment. C4-5 anterolisthesis, stable and degenerative Skull base and vertebrae: Negative for acute fracture. Remote T2 superior endplate fracture. Soft tissues and spinal canal: No prevertebral fluid or swelling. No visible canal hematoma. Disc levels: Upper cervical predominant degenerative facet spurring. Lower cervical predominant disc degeneration. Upper chest: Right pleural effusion again noted. Other: Right clavicle fracture with ORIF earlier this year. There is bulky callus about the fracture. IMPRESSION: 1. No evidence of acute intracranial or cervical spine injury. 2. Right scalp hematoma without fracture. 3. Negative for facial fracture. Electronically Signed   By: Monte Fantasia M.D.   On: 06/28/2017 07:37   Ct Cervical Spine Wo Contrast  Result Date: 06/28/2017 CLINICAL DATA:  Unwitnessed fall with right frontal trauma. Right facial bruising and neck pain. EXAM: CT HEAD WITHOUT CONTRAST CT MAXILLOFACIAL WITHOUT CONTRAST CT CERVICAL SPINE WITHOUT CONTRAST TECHNIQUE: Multidetector CT imaging of the head, cervical spine, and maxillofacial structures were performed using the standard protocol without intravenous contrast. Multiplanar CT image reconstructions of the cervical spine and maxillofacial structures were also generated. COMPARISON:  06/20/2017 FINDINGS: CT HEAD FINDINGS Brain: No evidence of acute infarction, hemorrhage, hydrocephalus, extra-axial collection or mass lesion/mass effect. Atrophy and chronic small vessel disease. Vascular: Atherosclerotic calcification. Skull: Right scalp hematoma without underlying fracture. CT MAXILLOFACIAL  FINDINGS Osseous: Negative for fracture or mandibular dislocation. Bilateral TMJ degenerative arthropathy Orbits: Bilateral cataract resection. No evidence of injury. Sinuses: No evidence of injury Soft tissues: Swelling about the lower left jaw. No opaque foreign body. CT CERVICAL SPINE FINDINGS Alignment: No traumatic malalignment. C4-5 anterolisthesis, stable and degenerative Skull base and vertebrae: Negative for acute fracture. Remote T2 superior endplate fracture. Soft tissues and spinal canal: No prevertebral fluid or swelling. No visible canal hematoma. Disc levels: Upper cervical predominant degenerative facet spurring. Lower cervical predominant disc degeneration. Upper chest: Right pleural effusion again noted. Other: Right clavicle fracture with ORIF earlier this year. There is bulky callus about the fracture. IMPRESSION: 1. No evidence of acute intracranial or cervical spine injury. 2. Right scalp hematoma without fracture. 3. Negative for facial fracture. Electronically Signed   By: Monte Fantasia M.D.   On: 06/28/2017 07:37   Dg Chest Portable 1 View  Result Date: 06/28/2017 CLINICAL DATA:  84 year old female post fall.  Initial encounter. EXAM: PORTABLE CHEST 1 VIEW COMPARISON:  05/25/2017 chest x-ray.  06/16/2017 chest CT. FINDINGS: Slight increase in size of moderately large right-sided pleural effusion. Persistent consolidation right middle lobe and right lung base. Cardiomegaly with biventricular pacer in place. Right central line tip caval atrial junction. No pneumothorax. Calcified tortuous aorta. Remote right clavicle fracture with angulation of fracture fragments and prominent callus formation. Clavicle plate and screws in place. No new fracture noted. IMPRESSION: Slight increase in size of moderately  large right-sided pleural effusion. Persistent consolidation right middle lobe and right lung base. Cardiomegaly with biventricular pacer in place. Right central line tip caval atrial  junction. Aortic Atherosclerosis (ICD10-I70.0). Remote right clavicle fracture with angulation of fracture fragments and prominent callus formation. Clavicle plate and screws in place. Electronically Signed   By: Genia Del M.D.   On: 06/28/2017 08:43   Ct Maxillofacial Wo Contrast  Result Date: 06/28/2017 CLINICAL DATA:  Unwitnessed fall with right frontal trauma. Right facial bruising and neck pain. EXAM: CT HEAD WITHOUT CONTRAST CT MAXILLOFACIAL WITHOUT CONTRAST CT CERVICAL SPINE WITHOUT CONTRAST TECHNIQUE: Multidetector CT imaging of the head, cervical spine, and maxillofacial structures were performed using the standard protocol without intravenous contrast. Multiplanar CT image reconstructions of the cervical spine and maxillofacial structures were also generated. COMPARISON:  06/20/2017 FINDINGS: CT HEAD FINDINGS Brain: No evidence of acute infarction, hemorrhage, hydrocephalus, extra-axial collection or mass lesion/mass effect. Atrophy and chronic small vessel disease. Vascular: Atherosclerotic calcification. Skull: Right scalp hematoma without underlying fracture. CT MAXILLOFACIAL FINDINGS Osseous: Negative for fracture or mandibular dislocation. Bilateral TMJ degenerative arthropathy Orbits: Bilateral cataract resection. No evidence of injury. Sinuses: No evidence of injury Soft tissues: Swelling about the lower left jaw. No opaque foreign body. CT CERVICAL SPINE FINDINGS Alignment: No traumatic malalignment. C4-5 anterolisthesis, stable and degenerative Skull base and vertebrae: Negative for acute fracture. Remote T2 superior endplate fracture. Soft tissues and spinal canal: No prevertebral fluid or swelling. No visible canal hematoma. Disc levels: Upper cervical predominant degenerative facet spurring. Lower cervical predominant disc degeneration. Upper chest: Right pleural effusion again noted. Other: Right clavicle fracture with ORIF earlier this year. There is bulky callus about the fracture.  IMPRESSION: 1. No evidence of acute intracranial or cervical spine injury. 2. Right scalp hematoma without fracture. 3. Negative for facial fracture. Electronically Signed   By: Monte Fantasia M.D.   On: 06/28/2017 07:37     Medications:       Assessment/ Plan:  [name] is a 84 y.o. white female with end stage renal disease on hemodialysis, coronary artery disease, pacemaker placement, meniere's disease, congestive heart failure, peripheral vascular disease, multiple falls and fractures.   MWF [facility] permcath/left AVF EDW 42.5kg.   1.  End-stage renal disease - MWF schedule - [facility] permcath   2.  Anemia of chronic kidney disease: hemoglobin 10.7 - epo with HD treatment   3.  Secondary hyperparathyroidism: outpatient labs: 5/13 PTH 640, phos 5, calcium 8.3 - sevelamer if patient eating  4.  Hypertension: blood pressure at goal 125/70 - metoprolol   LOS: 1 Hilton Saephan 6/4/201911:24 AM

## 2017-06-29 NOTE — Plan of Care (Signed)
  Problem: Clinical Measurements: Goal: Will remain free from infection Outcome: Progressing Note:  Remains afebrile   Problem: Safety: Goal: Ability to remain free from injury will improve Outcome: Progressing Note:  Fall precautions in place, low bed/mats in place    Problem: Clinical Measurements: Goal: Diagnostic test results will improve Outcome: Not Progressing Note:  Troponins elevated  BUN 35/3.01

## 2017-06-29 NOTE — Consult Note (Signed)
[facility] Nurse wound consult note Reason for Consult: Trauma abrasions to right elbow and right shoulder.  Scattered nonintact lesions to arms.  Fall at [facility].  Decline in mental status Wound type: trauma from fall Pressure Injury POA: NA Measurement: Right elbow, linear abrasion with nonapproximated edges:  6 cm x 3 cm x 0.2 cm  Right shoulder:  5 cm x 3 cm x 0.2 cm  Wound PFY:TWKM and moist Drainage (amount, consistency, odor) scant serosanguinous  No odor Periwound:thin fragile skin Scattered 0.3 cm abrasions to arms Dressing procedure/placement/frequency: Cleanse wounds to right elbow and right right shoulder:  Cleanse with NS.  Apply mupirocin ointment to wound bed.  COver with silicone foam dressing for atraumatic removal.  Reapply daily.  Change every three days and PRN soilage.  Will not follow at this time.  Please re-consult if needed.  Domenic Moras RN BSN [facility] Pager [phone]

## 2017-06-29 NOTE — Progress Notes (Addendum)
Initial Nutrition Assessment  DOCUMENTATION CODES:   Severe malnutrition in context of chronic illness  INTERVENTION:   If diet able to be advanced recommend   Recommend dysphagia 2 diet  Prostat liquid protein PO 30 ml BID with meals, each supplement provides 100 kcal, 15 grams protein.  B-complex with C daily   Boost Breeze po TID, each supplement provides 250 kcal and 9 grams of protein  Rena-vite daily  Ocuvite daily for wound healing (provides zinc, vitamin A, vitamin C, Vitamin E, copper, and selenium)  NUTRITION DIAGNOSIS:   Severe Malnutrition related to chronic illness(ESRD on HD, CHF, IBS) as evidenced by severe fat depletion, severe muscle depletion.  GOAL:   Patient will meet greater than or equal to 90% of their needs  MONITOR:   Labs, Weight trends, Skin, I & O's, Diet advancement  REASON FOR ASSESSMENT:   Malnutrition Screening Tool    ASSESSMENT:   84 year old female with past medical history of end-stage renal disease on hemodialysis, obstructive sleep apnea, dementia, history of recurrent falls, coronary artery disease, ischemic cardiomyopathy ejection fraction of 20 to 40%, chronic systolic CHF, secondary hyperparathyroidism who presents to the hospital with a fall and noted to have an elevated troponin of 2.9.   RD familiar with this pt from multiple previous admits. Pt with poor appetite and oral intake at baseline. Pt previously reported that she hated the food at [facility] and she complained of not being able to get salt, sauces, or gravy with her meals. Pt reported that she gets the same foods every day and she is tired of them. RD suspects that pt is likely on a renal diet at [facility]. Pt with wounds and widespread ecchymosis; suspect scurvy in the setting of chronic HD and poor oral intake. Pt does not like milky supplements as she reported that they upset her IBS but pt will drink Boost Breeze and Prostat if poured over ice. RD will  order supplements and Ocuvite if diet able to be advanced to encourage wound healing and replace losses from HD. Pt previously on dysphagia 2 diet; recommend continue this if diet advanced. Monitor for goals of care.   Medications reviewed and include: aspirin, heparin, synthroid, rena-vite, renvela, vitamin D   Labs reviewed: creat 3.01(H), Ca 8.3(L) Hgb 10.7(L), Hct 33.0(L)  NUTRITION - FOCUSED PHYSICAL EXAM:    Most Recent Value  Orbital Region  Severe depletion  Upper Arm Region  Severe depletion  Thoracic and Lumbar Region  Severe depletion  Buccal Region  Severe depletion  Temple Region  Severe depletion  Clavicle Bone Region  Severe depletion  Clavicle and Acromion Bone Region  Severe depletion  Scapular Bone Region  Severe depletion  Dorsal Hand  Severe depletion  Patellar Region  Severe depletion  Anterior Thigh Region  Severe depletion  Posterior Calf Region  Severe depletion  Edema (RD Assessment)  None  Hair  Reviewed  Eyes  Reviewed  Mouth  Reviewed  Skin  Reviewed  Nails  Reviewed     Diet Order:   Diet Order           Diet NPO time specified Except for: Sips with Meds  Diet effective now         EDUCATION NEEDS:   No education needs have been identified at this time  Skin:  Skin Assessment: Reviewed RN Assessment(Right elbow, linear abrasion with nonapproximated edges:  6 cm x 3 cm x 0.2 cm, Right shoulder:  5 cm x 3  cm x 0.2 cm )  Last BM:  6/3  Height:   Ht Readings from Last 1 Encounters:  06/28/17 5\' 4"  (1.626 m)    Weight:   Wt Readings from Last 1 Encounters:  06/29/17 80 lb 11.2 oz (36.6 kg)    Ideal Body Weight:  54.5 kg  BMI:  Body mass index is 13.85 kg/m.  Estimated Nutritional Needs:   Kcal:  1200-1400kcal/day   Protein:  68-78g/day   Fluid:  per MD  Koleen Distance MS, RD, LDN Pager #[phone] After Hours Pager: [phone]

## 2017-06-29 NOTE — Progress Notes (Signed)
[place] at [facility]                                                                                                                                                                                  Patient Demographics   Sharon [name], is a 84 y.o. female, DOB - [birth year], HFW:[id number]  Admit date - 06/28/2017   Admitting Physician Henreitta Leber, MD  Outpatient Primary MD for the patient is Kirk Ruths, MD   LOS - 1  Subjective: Altered mental status at the time of my evaluation she was poorly responsive and unable to arouse    Review of Systems:   CONSTITUTIONAL: Unable to arouse  Vitals:   Vitals:   06/28/17 2318 06/29/17 0345 06/29/17 0348 06/29/17 0749  BP: (!) 141/71  122/65 125/70  Pulse: 74  72 68  Resp: 18  18 16   Temp: 97.9 F (36.6 C)  97.6 F (36.4 C) 98 F (36.7 C)  TempSrc: Oral  Oral Oral  SpO2: 100%  100% 97%  Weight:  36.6 kg (80 lb 11.2 oz)    Height:        Wt Readings from Last 3 Encounters:  06/29/17 36.6 kg (80 lb 11.2 oz)  06/20/17 47.2 kg (104 lb)  06/16/17 47.2 kg (104 lb)     Intake/Output Summary (Last 24 hours) at 06/29/2017 1518 Last data filed at 06/28/2017 2240 Gross per 24 hour  Intake -  Output 16 ml  Net -16 ml    Physical Exam:   GENERAL: Chronically ill-appearing HEAD, EYES, EARS, NOSE AND THROAT: Atraumatic, normocephalic. Extraocular muscles are intact. Pupils equal and reactive to light. Sclerae anicteric. No conjunctival injection. No oro-pharyngeal erythema.  NECK: Supple. There is no jugular venous distention. No bruits, no lymphadenopathy, no thyromegaly.  HEART: Regular rate and rhythm,. No murmurs, no rubs, no clicks.  LUNGS: Clear to auscultation bilaterally. No rales or rhonchi. No wheezes.  ABDOMEN: Soft, flat, nontender, nondistended. Has good bowel sounds. No hepatosplenomegaly appreciated.  EXTREMITIES: No evidence of any cyanosis, clubbing, or peripheral edema.  +2  pedal and radial pulses bilaterally.  NEUROLOGIC: Hard to arouse SKIN: Moist and warm with no rashes appreciated.  Psych: Hard to arouse LN: No inguinal LN enlargement    Antibiotics   Anti-infectives (From admission, onward)   None      Medications   Scheduled Meds: . ascorbic acid  250 mg Intravenous BID  . aspirin EC  81 mg Oral Daily  . [START ON 06/30/2017] Chlorhexidine Gluconate Cloth  6 each Topical Q0600  . heparin  5,000 Units Subcutaneous Q8H  . levothyroxine  25 mcg Oral QAC  breakfast  . metoprolol succinate  25 mg Oral Daily  . multivitamin  1 tablet Oral Daily  . mupirocin cream   Topical Daily  . sertraline  50 mg Oral QHS  . sevelamer carbonate  800 mg Oral TID WC  . Vitamin D (Ergocalciferol)  50,000 Units Oral Q Mon   Continuous Infusions: PRN Meds:.acetaminophen **OR** acetaminophen, ondansetron **OR** ondansetron (ZOFRAN) IV   Data Review:   Micro Results Recent Results (from the past 240 hour(s))  MRSA PCR Screening     Status: None   Collection Time: 06/28/17  1:58 PM  Result Value Ref Range Status   MRSA by PCR NEGATIVE NEGATIVE Final    Comment:        The GeneXpert MRSA Assay (FDA approved for NASAL specimens only), is one component of a comprehensive MRSA colonization surveillance program. It is not intended to diagnose MRSA infection nor to guide or monitor treatment for MRSA infections. Performed at [facility], [address], [place], [place] [address]     Radiology Reports Ct Head Wo Contrast  Result Date: 06/28/2017 CLINICAL DATA:  Unwitnessed fall with right frontal trauma. Right facial bruising and neck pain. EXAM: CT HEAD WITHOUT CONTRAST CT MAXILLOFACIAL WITHOUT CONTRAST CT CERVICAL SPINE WITHOUT CONTRAST TECHNIQUE: Multidetector CT imaging of the head, cervical spine, and maxillofacial structures were performed using the standard protocol without intravenous contrast. Multiplanar CT image reconstructions of the  cervical spine and maxillofacial structures were also generated. COMPARISON:  06/20/2017 FINDINGS: CT HEAD FINDINGS Brain: No evidence of acute infarction, hemorrhage, hydrocephalus, extra-axial collection or mass lesion/mass effect. Atrophy and chronic small vessel disease. Vascular: Atherosclerotic calcification. Skull: Right scalp hematoma without underlying fracture. CT MAXILLOFACIAL FINDINGS Osseous: Negative for fracture or mandibular dislocation. Bilateral TMJ degenerative arthropathy Orbits: Bilateral cataract resection. No evidence of injury. Sinuses: No evidence of injury Soft tissues: Swelling about the lower left jaw. No opaque foreign body. CT CERVICAL SPINE FINDINGS Alignment: No traumatic malalignment. C4-5 anterolisthesis, stable and degenerative Skull base and vertebrae: Negative for acute fracture. Remote T2 superior endplate fracture. Soft tissues and spinal canal: No prevertebral fluid or swelling. No visible canal hematoma. Disc levels: Upper cervical predominant degenerative facet spurring. Lower cervical predominant disc degeneration. Upper chest: Right pleural effusion again noted. Other: Right clavicle fracture with ORIF earlier this year. There is bulky callus about the fracture. IMPRESSION: 1. No evidence of acute intracranial or cervical spine injury. 2. Right scalp hematoma without fracture. 3. Negative for facial fracture. Electronically Signed   By: Monte Fantasia M.D.   On: 06/28/2017 07:37   Ct Head Wo Contrast  Result Date: 06/20/2017 CLINICAL DATA:  Several recent falls EXAM: CT HEAD WITHOUT CONTRAST CT CERVICAL SPINE WITHOUT CONTRAST TECHNIQUE: Multidetector CT imaging of the head and cervical spine was performed following the standard protocol without intravenous contrast. Multiplanar CT image reconstructions of the cervical spine were also generated. COMPARISON:  CT head and CT cervical spine Jun 16, 2017 FINDINGS: CT HEAD FINDINGS Brain: Mild diffuse atrophy is stable.  There is no intracranial mass, hemorrhage, extra-axial fluid collection, or midline shift. There is patchy small vessel disease throughout the centra semiovale bilaterally, stable. No evident acute infarct. Vascular: No hyperdense vessel. There is calcification in each distal vertebral artery and in each carotid siphon. Skull: Scalp hematomas along the right frontal and temporal regions are again noted. Scalp hematoma to the left of midline in the frontal region is smaller compared to recent study. Bony calvarium appears intact. Sinuses/Orbits:  There is mucosal thickening in several ethmoid air cells. Other visualized paranasal sinuses are clear. Visualized orbits appear symmetric bilaterally. Other: Mastoid air cells are clear. CT CERVICAL SPINE FINDINGS Alignment: There is 1 mm of anterolisthesis of C3 on C4. There is 1 mm of anterolisthesis of C4 on C5. There is 1 mm of retrolisthesis of C5 on C6. There is 2 mm of retrolisthesis of C6 on C7. Skull base and vertebrae: The skull base and craniocervical junction regions appear normal. Bones are osteoporotic. No fracture is evident. No blastic or lytic bone lesions are evident. Soft tissues and spinal canal: Prevertebral soft tissues and predental space regions are normal. No paraspinous lesion. No evident cord or canal hematoma. Disc levels: There is moderately severe disc space narrowing at C5-6 and C6-7. There is slightly milder disc space narrowing at C4-5. There is facet osteoarthritic change at multiple levels bilaterally. There is exit foraminal narrowing on the left due to bony hypertrophy with impression on the exiting nerve root due to bony hypertrophy at this level. Similar changes are noted at C5-6 bilaterally and at C6-7 bilaterally. No frank disc extrusion or high-grade stenosis evident. Upper chest: There is a sizable free-flowing pleural effusion on the right. Upper lung zones elsewhere clear. Other: There is calcification in each subclavian and  carotid artery. There is a nodular lesion in the right lobe of the thyroid measuring 1.5 x 1.5 cm. IMPRESSION: CT head: Stable atrophy with supratentorial small vessel disease. No acute infarct. No mass or hemorrhage. Scalp hematomas are stable on the right and significantly smaller to the left of midline in the frontal region. No fracture evident. Foci of arterial vascular calcification noted. Mucosal thickening noted in several ethmoid air cells. CT cervical spine: No fracture. Areas of mild spondylolisthesis appear due to underlying spondylosis. There is multilevel arthropathy. Sizable free-flowing pleural effusion on the right. Foci of vascular calcification in subclavian and carotid arteries bilaterally. **An incidental finding of potential clinical significance has been found. 1.5 x 1.5 cm right thyroid nodular lesion. Consider further evaluation with thyroid ultrasound nonemergently. If patient is clinically hyperthyroid, consider nuclear medicine thyroid uptake and scan.** Electronically Signed   By: Lowella Grip III M.D.   On: 06/20/2017 19:57   Ct Head Wo Contrast  Result Date: 06/16/2017 CLINICAL DATA:  Fall with hematoma to forehead. EXAM: CT HEAD WITHOUT CONTRAST CT CERVICAL SPINE WITHOUT CONTRAST TECHNIQUE: Multidetector CT imaging of the head and cervical spine was performed following the standard protocol without intravenous contrast. Multiplanar CT image reconstructions of the cervical spine were also generated. COMPARISON:  CT of the head on 06/11/2017 and CT of the cervical spine on 04/21/2017. FINDINGS: CT HEAD FINDINGS Brain: Stable small vessel disease in the periventricular white matter. The brain demonstrates no evidence of acute hemorrhage, infarction, edema, mass effect, extra-axial fluid collection, hydrocephalus or mass lesion. Vascular: No hyperdense vessel or unexpected calcification. Skull: Recent right parietal scalp hematoma is smaller in size. There is a new right and  midline frontal scalp hematoma present. No underlying acute skull fracture is identified. Sinuses/Orbits: No acute finding. Other: None. CT CERVICAL SPINE FINDINGS Alignment: Normal alignment without subluxation. Skull base and vertebrae: No evidence of cervical spine fracture. No bony lesions or destruction identified. Soft tissues and spinal canal: No prevertebral soft tissue swelling. Disc levels: Stable degenerative disc disease at C5-6 and C6-7. Mild degenerative disc disease at C4-5. Upper chest: Right pleural fluid seen at the apex. IMPRESSION: 1. New right frontal and midline scalp  hematoma without skull fracture or acute intracranial findings. The recent right parietal scalp hematoma is smaller in size. 2. No evidence of acute cervical spine injury. Stable degenerative disc disease. 3. Right-sided pleural effusion seen in the upper right hemithorax. Electronically Signed   By: Aletta Edouard M.D.   On: 06/16/2017 18:32   Ct Head Wo Contrast  Result Date: 06/11/2017 CLINICAL DATA:  Unwitnessed fall. EXAM: CT HEAD WITHOUT CONTRAST TECHNIQUE: Contiguous axial images were obtained from the base of the skull through the vertex without intravenous contrast. COMPARISON:  April 21, 2017 FINDINGS: Brain: No subdural, epidural, or subarachnoid hemorrhage. Cerebellum, brainstem, and basal cisterns are normal. Ventricles and sulci are normal. White matter changes persist. No acute cortical ischemia or infarct. No mass effect or midline shift. Vascular: Calcified atherosclerosis is seen in the intracranial carotids. Skull: Normal. Negative for fracture or focal lesion. Sinuses/Orbits: No acute finding. Other: Hematoma over the right lateral scalp. Extracranial soft tissues are otherwise normal. IMPRESSION: 1. Hematoma over the right lateral scalp. No underlying fracture identified. 2. No acute intracranial abnormality identified. Chronic white matter changes. Electronically Signed   By: Dorise Bullion III M.D    On: 06/11/2017 11:55   Ct Chest Wo Contrast  Result Date: 06/16/2017 CLINICAL DATA:  Status post multiple falls with right shoulder pain and scattered bruising. EXAM: CT CHEST WITHOUT CONTRAST TECHNIQUE: Multidetector CT imaging of the chest was performed following the standard protocol without IV contrast. COMPARISON:  Chest CT dated 01/01/2017 FINDINGS: Cardiovascular: Enlarged heart. Biventricular pacemaker in place. No evidence of pericardial effusion. Calcific atherosclerotic disease of the coronary arteries and aorta. Mediastinum/Nodes: No enlarged mediastinal or axillary lymph nodes. Thyroid gland, trachea, and esophagus demonstrate no significant findings. Lungs/Pleura: Large layering right pleural effusion with compressive atelectasis of the right lower lobe and near complete collapse of the right middle lobe. Upper Abdomen: Atrophic kidneys.  Vascular calcifications. Musculoskeletal: There has been a prior side plate and screw fixation through distal right clavicular fracture. The screws are anchored within the distal fracture fragment and posttraumatic callus. There is stable deformity with significant angulation at the proximal fracture site. There is longitudinal linear lucency through the proximal right clavicle with scant overlying callus formation, which likely represents a nonunion fracture. No evidence of spinal rib fractures. There is a healing or healed sternal body fracture. IMPRESSION: Large layering right pleural effusion with compressive atelectasis of the right lower lobe and near complete collapse of the right middle lobe. Enlarged heart. Calcific atherosclerotic disease of the coronary arteries and aorta. Sideplate and screw fixation of prior comminuted right clavicular fracture with screws which are anchored to the distal fracture fragment and callus. Sharp angulation at the proximal fracture line, which appears stable from the radiograph dated 05/25/2017. Healing or healed sternal  body fracture. No evidence of acute injury. Aortic Atherosclerosis (ICD10-I70.0). Electronically Signed   By: Fidela Salisbury M.D.   On: 06/16/2017 18:43   Ct Cervical Spine Wo Contrast  Result Date: 06/28/2017 CLINICAL DATA:  Unwitnessed fall with right frontal trauma. Right facial bruising and neck pain. EXAM: CT HEAD WITHOUT CONTRAST CT MAXILLOFACIAL WITHOUT CONTRAST CT CERVICAL SPINE WITHOUT CONTRAST TECHNIQUE: Multidetector CT imaging of the head, cervical spine, and maxillofacial structures were performed using the standard protocol without intravenous contrast. Multiplanar CT image reconstructions of the cervical spine and maxillofacial structures were also generated. COMPARISON:  06/20/2017 FINDINGS: CT HEAD FINDINGS Brain: No evidence of acute infarction, hemorrhage, hydrocephalus, extra-axial collection or mass lesion/mass effect. Atrophy and chronic  small vessel disease. Vascular: Atherosclerotic calcification. Skull: Right scalp hematoma without underlying fracture. CT MAXILLOFACIAL FINDINGS Osseous: Negative for fracture or mandibular dislocation. Bilateral TMJ degenerative arthropathy Orbits: Bilateral cataract resection. No evidence of injury. Sinuses: No evidence of injury Soft tissues: Swelling about the lower left jaw. No opaque foreign body. CT CERVICAL SPINE FINDINGS Alignment: No traumatic malalignment. C4-5 anterolisthesis, stable and degenerative Skull base and vertebrae: Negative for acute fracture. Remote T2 superior endplate fracture. Soft tissues and spinal canal: No prevertebral fluid or swelling. No visible canal hematoma. Disc levels: Upper cervical predominant degenerative facet spurring. Lower cervical predominant disc degeneration. Upper chest: Right pleural effusion again noted. Other: Right clavicle fracture with ORIF earlier this year. There is bulky callus about the fracture. IMPRESSION: 1. No evidence of acute intracranial or cervical spine injury. 2. Right scalp hematoma  without fracture. 3. Negative for facial fracture. Electronically Signed   By: Monte Fantasia M.D.   On: 06/28/2017 07:37   Ct Cervical Spine Wo Contrast  Result Date: 06/20/2017 CLINICAL DATA:  Several recent falls EXAM: CT HEAD WITHOUT CONTRAST CT CERVICAL SPINE WITHOUT CONTRAST TECHNIQUE: Multidetector CT imaging of the head and cervical spine was performed following the standard protocol without intravenous contrast. Multiplanar CT image reconstructions of the cervical spine were also generated. COMPARISON:  CT head and CT cervical spine Jun 16, 2017 FINDINGS: CT HEAD FINDINGS Brain: Mild diffuse atrophy is stable. There is no intracranial mass, hemorrhage, extra-axial fluid collection, or midline shift. There is patchy small vessel disease throughout the centra semiovale bilaterally, stable. No evident acute infarct. Vascular: No hyperdense vessel. There is calcification in each distal vertebral artery and in each carotid siphon. Skull: Scalp hematomas along the right frontal and temporal regions are again noted. Scalp hematoma to the left of midline in the frontal region is smaller compared to recent study. Bony calvarium appears intact. Sinuses/Orbits: There is mucosal thickening in several ethmoid air cells. Other visualized paranasal sinuses are clear. Visualized orbits appear symmetric bilaterally. Other: Mastoid air cells are clear. CT CERVICAL SPINE FINDINGS Alignment: There is 1 mm of anterolisthesis of C3 on C4. There is 1 mm of anterolisthesis of C4 on C5. There is 1 mm of retrolisthesis of C5 on C6. There is 2 mm of retrolisthesis of C6 on C7. Skull base and vertebrae: The skull base and craniocervical junction regions appear normal. Bones are osteoporotic. No fracture is evident. No blastic or lytic bone lesions are evident. Soft tissues and spinal canal: Prevertebral soft tissues and predental space regions are normal. No paraspinous lesion. No evident cord or canal hematoma. Disc levels:  There is moderately severe disc space narrowing at C5-6 and C6-7. There is slightly milder disc space narrowing at C4-5. There is facet osteoarthritic change at multiple levels bilaterally. There is exit foraminal narrowing on the left due to bony hypertrophy with impression on the exiting nerve root due to bony hypertrophy at this level. Similar changes are noted at C5-6 bilaterally and at C6-7 bilaterally. No frank disc extrusion or high-grade stenosis evident. Upper chest: There is a sizable free-flowing pleural effusion on the right. Upper lung zones elsewhere clear. Other: There is calcification in each subclavian and carotid artery. There is a nodular lesion in the right lobe of the thyroid measuring 1.5 x 1.5 cm. IMPRESSION: CT head: Stable atrophy with supratentorial small vessel disease. No acute infarct. No mass or hemorrhage. Scalp hematomas are stable on the right and significantly smaller to the left of midline in the  frontal region. No fracture evident. Foci of arterial vascular calcification noted. Mucosal thickening noted in several ethmoid air cells. CT cervical spine: No fracture. Areas of mild spondylolisthesis appear due to underlying spondylosis. There is multilevel arthropathy. Sizable free-flowing pleural effusion on the right. Foci of vascular calcification in subclavian and carotid arteries bilaterally. **An incidental finding of potential clinical significance has been found. 1.5 x 1.5 cm right thyroid nodular lesion. Consider further evaluation with thyroid ultrasound nonemergently. If patient is clinically hyperthyroid, consider nuclear medicine thyroid uptake and scan.** Electronically Signed   By: Lowella Grip III M.D.   On: 06/20/2017 19:57   Ct Cervical Spine Wo Contrast  Result Date: 06/16/2017 CLINICAL DATA:  Fall with hematoma to forehead. EXAM: CT HEAD WITHOUT CONTRAST CT CERVICAL SPINE WITHOUT CONTRAST TECHNIQUE: Multidetector CT imaging of the head and cervical spine  was performed following the standard protocol without intravenous contrast. Multiplanar CT image reconstructions of the cervical spine were also generated. COMPARISON:  CT of the head on 06/11/2017 and CT of the cervical spine on 04/21/2017. FINDINGS: CT HEAD FINDINGS Brain: Stable small vessel disease in the periventricular white matter. The brain demonstrates no evidence of acute hemorrhage, infarction, edema, mass effect, extra-axial fluid collection, hydrocephalus or mass lesion. Vascular: No hyperdense vessel or unexpected calcification. Skull: Recent right parietal scalp hematoma is smaller in size. There is a new right and midline frontal scalp hematoma present. No underlying acute skull fracture is identified. Sinuses/Orbits: No acute finding. Other: None. CT CERVICAL SPINE FINDINGS Alignment: Normal alignment without subluxation. Skull base and vertebrae: No evidence of cervical spine fracture. No bony lesions or destruction identified. Soft tissues and spinal canal: No prevertebral soft tissue swelling. Disc levels: Stable degenerative disc disease at C5-6 and C6-7. Mild degenerative disc disease at C4-5. Upper chest: Right pleural fluid seen at the apex. IMPRESSION: 1. New right frontal and midline scalp hematoma without skull fracture or acute intracranial findings. The recent right parietal scalp hematoma is smaller in size. 2. No evidence of acute cervical spine injury. Stable degenerative disc disease. 3. Right-sided pleural effusion seen in the upper right hemithorax. Electronically Signed   By: Aletta Edouard M.D.   On: 06/16/2017 18:32   Ct T-spine No Charge  Result Date: 06/16/2017 CLINICAL DATA:  Fall today. EXAM: CT THORACIC SPINE WITHOUT CONTRAST TECHNIQUE: Multidetector CT images of the thoracic were obtained using the standard protocol without intravenous contrast. COMPARISON:  None. FINDINGS: Alignment: The thoracic spine shows normal alignment. Vertebrae: No evidence of fracture.  No  bony lesions. Paraspinal and other soft tissues: No paravertebral soft tissue abnormalities. Disc levels: Minimal degenerative disc disease and osteophyte formation. Moderate to large right pleural effusion present. IMPRESSION: No evidence of acute thoracic spine injury. Moderate to large right pleural effusion. Electronically Signed   By: Aletta Edouard M.D.   On: 06/16/2017 18:36   Dg Chest Portable 1 View  Result Date: 06/28/2017 CLINICAL DATA:  84 year old female post fall.  Initial encounter. EXAM: PORTABLE CHEST 1 VIEW COMPARISON:  05/25/2017 chest x-ray.  06/16/2017 chest CT. FINDINGS: Slight increase in size of moderately large right-sided pleural effusion. Persistent consolidation right middle lobe and right lung base. Cardiomegaly with biventricular pacer in place. Right central line tip caval atrial junction. No pneumothorax. Calcified tortuous aorta. Remote right clavicle fracture with angulation of fracture fragments and prominent callus formation. Clavicle plate and screws in place. No new fracture noted. IMPRESSION: Slight increase in size of moderately large right-sided pleural effusion. Persistent consolidation right  middle lobe and right lung base. Cardiomegaly with biventricular pacer in place. Right central line tip caval atrial junction. Aortic Atherosclerosis (ICD10-I70.0). Remote right clavicle fracture with angulation of fracture fragments and prominent callus formation. Clavicle plate and screws in place. Electronically Signed   By: Genia Del M.D.   On: 06/28/2017 08:43   Ct Maxillofacial Wo Contrast  Result Date: 06/28/2017 CLINICAL DATA:  Unwitnessed fall with right frontal trauma. Right facial bruising and neck pain. EXAM: CT HEAD WITHOUT CONTRAST CT MAXILLOFACIAL WITHOUT CONTRAST CT CERVICAL SPINE WITHOUT CONTRAST TECHNIQUE: Multidetector CT imaging of the head, cervical spine, and maxillofacial structures were performed using the standard protocol without intravenous  contrast. Multiplanar CT image reconstructions of the cervical spine and maxillofacial structures were also generated. COMPARISON:  06/20/2017 FINDINGS: CT HEAD FINDINGS Brain: No evidence of acute infarction, hemorrhage, hydrocephalus, extra-axial collection or mass lesion/mass effect. Atrophy and chronic small vessel disease. Vascular: Atherosclerotic calcification. Skull: Right scalp hematoma without underlying fracture. CT MAXILLOFACIAL FINDINGS Osseous: Negative for fracture or mandibular dislocation. Bilateral TMJ degenerative arthropathy Orbits: Bilateral cataract resection. No evidence of injury. Sinuses: No evidence of injury Soft tissues: Swelling about the lower left jaw. No opaque foreign body. CT CERVICAL SPINE FINDINGS Alignment: No traumatic malalignment. C4-5 anterolisthesis, stable and degenerative Skull base and vertebrae: Negative for acute fracture. Remote T2 superior endplate fracture. Soft tissues and spinal canal: No prevertebral fluid or swelling. No visible canal hematoma. Disc levels: Upper cervical predominant degenerative facet spurring. Lower cervical predominant disc degeneration. Upper chest: Right pleural effusion again noted. Other: Right clavicle fracture with ORIF earlier this year. There is bulky callus about the fracture. IMPRESSION: 1. No evidence of acute intracranial or cervical spine injury. 2. Right scalp hematoma without fracture. 3. Negative for facial fracture. Electronically Signed   By: Monte Fantasia M.D.   On: 06/28/2017 07:37     CBC Recent Labs  Lab 06/28/17 0906 06/28/17 1652 06/29/17 0719  WBC 9.9 8.6 8.3  HGB 11.3* 11.5* 10.7*  HCT 34.1* 35.0 33.0*  PLT 263 246 245  MCV 94.3 94.9 94.7  MCH 31.4 31.2 30.7  MCHC 33.3 32.8 32.5  RDW 21.2* 21.4* 21.5*  LYMPHSABS 0.6*  --   --   MONOABS 0.8  --   --   EOSABS 0.0  --   --   BASOSABS 0.0  --   --     Chemistries  Recent Labs  Lab 06/28/17 0906 06/29/17 0719  NA 140 138  K 5.6* 3.7  CL 99*  97*  CO2 20* 27  GLUCOSE 110* 71  BUN 73* 35*  CREATININE 5.60* 3.01*  CALCIUM 9.2 8.3*  AST 130*  --   ALT 69*  --   ALKPHOS 179*  --   BILITOT 3.6*  --    ------------------------------------------------------------------------------------------------------------------ estimated creatinine clearance is 7.5 mL/min (A) (by C-G formula based on SCr of 3.01 mg/dL (H)). ------------------------------------------------------------------------------------------------------------------ No results for input(s): HGBA1C in the last 72 hours. ------------------------------------------------------------------------------------------------------------------ No results for input(s): CHOL, HDL, LDLCALC, TRIG, CHOLHDL, LDLDIRECT in the last 72 hours. ------------------------------------------------------------------------------------------------------------------ No results for input(s): TSH, T4TOTAL, T3FREE, THYROIDAB in the last 72 hours.  Invalid input(s): FREET3 ------------------------------------------------------------------------------------------------------------------ No results for input(s): VITAMINB12, FOLATE, FERRITIN, TIBC, IRON, RETICCTPCT in the last 72 hours.  Coagulation profile No results for input(s): INR, PROTIME in the last 168 hours.  No results for input(s): DDIMER in the last 72 hours.  Cardiac Enzymes Recent Labs  Lab 06/28/17 1259 06/28/17 1652 06/28/17 2023  TROPONINI  2.64* 3.00* 2.96*   ------------------------------------------------------------------------------------------------------------------ Invalid input(s): POCBNP    Assessment & Plan   84 year old female with past medical history of end-stage renal disease on hemodialysis, obstructive sleep apnea, dementia, history of recurrent falls, coronary artery disease, ischemic cardiomyopathy ejection fraction of 20 to 16%, chronic systolic CHF, secondary hyperparathyroidism who presents to the hospital  with a fall and noted to have an elevated troponin of 2.9.  1.  Altered mental status/encephalopathy- CT scan of the head unrevealing Currently no evidence of infection I discussed with her brother if she does not show improvement would make her comfort care due to recurrent falls and failure to improve   2.  Elevated troponin- possible non-ST MI not a candidate for any type of intervention medical management  3.  End-stage renal disease on hemodialysis-patient gets dialysis on Monday Wednesday and Friday. Nephrology consult appreciated dialyzed yesterday  4.  Essential hypertension-continue Toprol.  5.  Hypothyroidism-continue Synthroid.  6.  Secondary hyperparathyroidism-continue Renvela.  7.  History of depression-continue Zoloft  Poor prognosis recurrent falls     Code Status Orders  (From admission, onward)        Start     Ordered   06/29/17 0905  Do not attempt resuscitation (DNR)  Continuous    Question Answer Comment  In the event of cardiac or respiratory ARREST Do not call a "code blue"   In the event of cardiac or respiratory ARREST Do not perform Intubation, CPR, defibrillation or ACLS   In the event of cardiac or respiratory ARREST Use medication by any route, position, wound care, and other measures to relive pain and suffering. May use oxygen, suction and manual treatment of airway obstruction as needed for comfort.      06/29/17 0904    Code Status History    Date Active Date Inactive Code Status Order ID Comments User Context   06/28/2017 1239 06/29/2017 0904 DNR [id number]  Henreitta Leber, MD Inpatient   05/24/2017 2258 06/01/2017 1706 DNR [id number]  Vaughan Basta, MD Inpatient   02/16/2017 1928 02/17/2017 1949 Full Code [id number]  Poggi, Marshall Cork, MD Inpatient   02/03/2017 1230 02/04/2017 1907 DNR [id number]  Nicholes Mango, MD ED   01/01/2017 2027 01/08/2017 1858 Full Code [id number]  [name], MD ED   05/12/2016 0809 05/14/2016 1439 DNR [id number]   Max Sane, MD Inpatient   05/10/2016 0506 05/12/2016 0809 Full Code [id number]  Saundra Shelling, MD Inpatient   12/15/2015 1515 12/19/2015 0159 Full Code [id number]  Oletta Cohn, DO Inpatient   12/14/2015 1613 12/15/2015 1515 Full Code [id number]  Bettey Costa, MD Inpatient    Advance Directive Documentation     Most Recent Value  Type of Advance Directive  Out of facility DNR (pink MOST or yellow form)  Pre-existing out of facility DNR order (yellow form or pink MOST form)  Physician notified to receive inpatient order  "MOST" Form in Place?  -           Consults nephrology, cardiology  DVT Prophylaxis heparin  Lab Results  Component Value Date   PLT 245 06/29/2017     Time Spent in minutes 35 minutes greater than 50% of time spent in care coordination and counseling patient regarding the condition and plan of care.   Dustin Flock M.D on 06/29/2017 at 3:18 PM  Between 7am to 6pm - Pager - [phone]  After 6pm go to [email or web address] - Proofreader  [employer]   Office  [phone]

## 2017-06-29 NOTE — Progress Notes (Signed)
Advanced care plan.  Purpose of the Encounter: plan of care  Parties in Attendance: d/w Brother Santina Evans   Patient's Decision Capacity: not intact  Subjective/Patient's story: Sharon [name]  is a 84 y.o. female with a known history of advanced dementia, coronary artery disease, ischemic cardiomyopathy ejection fraction of 43-27%, chronic systolic CHF, irritable bowel syndrome, end-stage renal disease on hemodialysis, history of obstructive sleep apnea, recurrent falls who presents to the hospital from a skilled nursing facility due to a fall and incidentally noted to have a troponin of 2.9.    Patient now unresponsive.  Evaluation so far and is negative     Objective/Medical story I discussed with patient's brother extensively regarding overall poor prognosis and recurrent falls.  I explained to him that current decline is drastic and recommended considering comfort care measures   Goals of care determination:  Brothers states that he would like to see how she does over the next 24 hours if no improvement then would be pursuing comfort care measures   CODE STATUS: DNR, considering comfort care measures tomorrow  Time spent discussing advanced care planning: 16 minutes

## 2017-06-29 NOTE — Clinical Social Work Note (Signed)
CSW spoke with patient and her family, patient and family would like her to return back to [facility] where she is a long term care resident.  Formal assessment to follow, CSW to continue to follow patient and facilitate discharge planning.  Jones Broom. [place], MSW, [place]  06/29/2017 6:23 PM

## 2017-06-30 DIAGNOSIS — W19XXXA Unspecified fall, initial encounter: Secondary | ICD-10-CM

## 2017-06-30 DIAGNOSIS — Z515 Encounter for palliative care: Secondary | ICD-10-CM

## 2017-06-30 DIAGNOSIS — Z7189 Other specified counseling: Secondary | ICD-10-CM

## 2017-06-30 LAB — RENAL FUNCTION PANEL
Albumin: 3.2 g/dL — ABNORMAL LOW (ref 3.5–5.0)
Anion gap: 14 (ref 5–15)
BUN: 56 mg/dL — AB (ref 6–20)
CHLORIDE: 97 mmol/L — AB (ref 101–111)
CO2: 27 mmol/L (ref 22–32)
Calcium: 8 mg/dL — ABNORMAL LOW (ref 8.9–10.3)
Creatinine, Ser: 4.35 mg/dL — ABNORMAL HIGH (ref 0.44–1.00)
GFR calc Af Amer: 10 mL/min — ABNORMAL LOW (ref >60)
GFR calc non Af Amer: 8 mL/min — ABNORMAL LOW (ref >60)
GLUCOSE: 109 mg/dL — AB (ref 65–99)
PHOSPHORUS: 6 mg/dL — AB (ref 2.5–4.6)
POTASSIUM: 3.6 mmol/L (ref 3.5–5.1)
Sodium: 138 mmol/L (ref 135–145)

## 2017-06-30 LAB — CBC
HEMATOCRIT: 33.4 % — AB (ref 35.0–47.0)
Hemoglobin: 10.8 g/dL — ABNORMAL LOW (ref 12.0–16.0)
MCH: 30.8 pg (ref 26.0–34.0)
MCHC: 32.3 g/dL (ref 32.0–36.0)
MCV: 95.2 fL (ref 80.0–100.0)
Platelets: 254 10*3/uL (ref 150–440)
RBC: 3.51 MIL/uL — ABNORMAL LOW (ref 3.80–5.20)
RDW: 20.8 % — ABNORMAL HIGH (ref 11.5–14.5)
WBC: 7.2 10*3/uL (ref 3.6–11.0)

## 2017-06-30 LAB — HEPATITIS B SURFACE ANTIBODY, QUANTITATIVE: HEPATITIS B-POST: 6 m[IU]/mL — AB

## 2017-06-30 LAB — HEPATITIS B SURFACE ANTIGEN: HEP B S AG: NEGATIVE

## 2017-06-30 MED ORDER — LEVOFLOXACIN 500 MG PO TABS
500.0000 mg | ORAL_TABLET | Freq: Every day | ORAL | Status: DC
  Administered 2017-06-30 – 2017-07-01 (×2): 500 mg via ORAL
  Filled 2017-06-30 (×2): qty 1

## 2017-06-30 MED ORDER — METOPROLOL SUCCINATE ER 25 MG PO TB24
12.5000 mg | ORAL_TABLET | Freq: Every day | ORAL | Status: DC
  Administered 2017-07-01: 12.5 mg via ORAL
  Filled 2017-06-30: qty 1

## 2017-06-30 NOTE — Progress Notes (Signed)
Wound dressing changes to the R elbow and R shoulder changed at this time, as ordered. Appreciate the help of Danae Chen, Therapist, sports. Patient tolerated well. Dressings dated. Will continue to monitor dressing status. Wenda Low [facility]

## 2017-06-30 NOTE — Progress Notes (Signed)
[facility]  ROUNDING NOTE   Subjective:   Seen and examined on hemodialysis. Tolerating treatment well. Alert and oriented.     HEMODIALYSIS FLOWSHEET:  Blood Flow Rate (mL/min): 400 mL/min Arterial Pressure (mmHg): -200 mmHg Venous Pressure (mmHg): 200 mmHg Transmembrane Pressure (mmHg): 40 mmHg Ultrafiltration Rate (mL/min): 210 mL/min Dialysate Flow Rate (mL/min): 600 ml/min Conductivity: Machine : 14.2 Conductivity: Machine : 14.2 Dialysis Fluid Bolus: Normal Saline Bolus Amount (mL): 250 mL    Objective:  Vital signs in last 24 hours:  Temp:  [98 F (36.7 C)-98.7 F (37.1 C)] 98 F (36.7 C) (06/05 0844) Pulse Rate:  [61-71] 61 (06/05 1100) Resp:  [13-20] 16 (06/05 1100) BP: (95-131)/(39-80) 95/43 (06/05 1100) SpO2:  [94 %-100 %] 97 % (06/05 1100)  Weight change:  Filed Weights   06/28/17 1918 06/28/17 2240 06/29/17 0345  Weight: 46.3 kg (102 lb) 46.2 kg (101 lb 13.6 oz) 36.6 kg (80 lb 11.2 oz)    Intake/Output: I/O last 3 completed shifts: In: 480 [P.O.:480] Out: 116 [Urine:100; Other:16]   Intake/Output this shift:  No intake/output data recorded.  Physical Exam: General: NAD, laying in bed  Head: Normocephalic, atraumatic. Moist oral mucosal membranes  Eyes: Anicteric, PERRL  Neck: Supple, trachea midline  Lungs:  Clear to auscultation  Heart: Regular rate and rhythm  Abdomen:  Soft, nontender  Extremities: no peripheral edema.  Neurologic: No alert or oriented  Skin: No lesions  Access: LIJ permcath. Left AVF    Basic Metabolic Panel: Recent Labs  Lab 06/28/17 0906 06/29/17 0719 06/30/17 0945  NA 140 138 138  K 5.6* 3.7 3.6  CL 99* 97* 97*  CO2 20* 27 27  GLUCOSE 110* 71 109*  BUN 73* 35* 56*  CREATININE 5.60* 3.01* 4.35*  CALCIUM 9.2 8.3* 8.0*  PHOS  --   --  6.0*    Liver Function Tests: Recent Labs  Lab 06/28/17 0906 06/30/17 0945  AST 130*  --   ALT 69*  --   ALKPHOS 179*  --   BILITOT 3.6*  --   PROT  7.6  --   ALBUMIN 3.7 3.2*   No results for input(s): LIPASE, AMYLASE in the last 168 hours. No results for input(s): AMMONIA in the last 168 hours.  CBC: Recent Labs  Lab 06/28/17 0906 06/28/17 1652 06/29/17 0719 06/30/17 0945  WBC 9.9 8.6 8.3 7.2  NEUTROABS 8.4*  --   --   --   HGB 11.3* 11.5* 10.7* 10.8*  HCT 34.1* 35.0 33.0* 33.4*  MCV 94.3 94.9 94.7 95.2  PLT 263 246 245 254    Cardiac Enzymes: Recent Labs  Lab 06/28/17 0906 06/28/17 1259 06/28/17 1652 06/28/17 2023  TROPONINI 2.99* 2.64* 3.00* 2.96*    BNP: Invalid input(s): POCBNP  CBG: No results for input(s): GLUCAP in the last 168 hours.  Microbiology: Results for orders placed or performed during the hospital encounter of 06/28/17  MRSA PCR Screening     Status: None   Collection Time: 06/28/17  1:58 PM  Result Value Ref Range Status   MRSA by PCR NEGATIVE NEGATIVE Final    Comment:        The GeneXpert MRSA Assay (FDA approved for NASAL specimens only), is one component of a comprehensive MRSA colonization surveillance program. It is not intended to diagnose MRSA infection nor to guide or monitor treatment for MRSA infections. Performed at [facility], [address], [place], [place] [address]     Coagulation  Studies: No results for input(s): LABPROT, INR in the last 72 hours.  Urinalysis: Recent Labs    06/28/17 0906  COLORURINE AMBER*  LABSPEC 1.023  PHURINE 5.0  GLUCOSEU 50*  HGBUR NEGATIVE  BILIRUBINUR NEGATIVE  KETONESUR NEGATIVE  PROTEINUR >=300*  NITRITE NEGATIVE  LEUKOCYTESUR NEGATIVE      Imaging: No results found.   Medications:       Assessment/ Plan:  [name] is a 84 y.o. white female with end stage renal disease on hemodialysis, coronary artery disease, pacemaker placement, meniere's disease, congestive heart failure, peripheral vascular disease, multiple falls and fractures.   MWF [facility] permcath/left AVF EDW  42.5kg.   1.  End-stage renal disease: seen and examined on hemodialysis treatment. Tolerating treatment well. Using LIJ permcath  2.  Anemia of chronic kidney disease: hemoglobin 10.8 - epo as outpatient.   3.  Secondary hyperparathyroidism: outpatient labs: 5/13 PTH 640, phos 5, calcium 8.3 - sevelamer   4.  Hypertension: blood pressure is low during dialysis treatment.  - metoprolol  5. Nutrition: Vitamin C deficiency - IV vitamin C. Will need PO supplement on discharge.    LOS: 2 [name] 6/5/20192:09 PM

## 2017-06-30 NOTE — Plan of Care (Signed)
  Problem: Health Behavior/Discharge Planning: Goal: Ability to manage health-related needs will improve Outcome: Progressing Note:  Patient agreeable to second HD treatment for this admission. In HD now. Will continue to monitor renal progress. B.U.N. and creatinine level better today than they were on admission. Sharon [name] [facility]

## 2017-06-30 NOTE — Clinical Social Work Note (Signed)
Clinical Social Work Assessment  Patient Details  [name] MRN: [id number] Date of Birth: February 04, 1926  Date of referral:  06/30/17               Reason for consult:  Facility Placement                Permission sought to share information with:  Family Supports, Customer service manager Permission granted to share information::  Yes, Verbal Permission Granted  Name::     Riddle,[phone] or Dunn,Tracy Niece [phone]   Agency::  SNF admissions  Relationship::     Contact Information:     Housing/Transportation Living arrangements for the past 2 months:  [place] of Information:  Adult Children, Siblings, Patient Patient Interpreter Needed:  None Criminal Activity/Legal Involvement Pertinent to Current Situation/Hospitalization:  No - Comment as needed Significant Relationships:  Adult Children, Other Family Members Lives with:  Facility Resident Do you feel safe going back to the place where you live?  Yes Need for family participation in patient care:  Yes (Comment)  Care giving concerns:  Patient's family did express some concerns about patient returning back to [facility], they are working with administration regarding issues.    Social Worker assessment / plan:  Patient is an 84 year old female who is alert and oriented x2, patient's family was at bedside, assessment completed by speaking to them due to patient's confusion and hard of hearing.  Patient has been at [facility] about 2.5 years, family express they are overall satisfied by care.  Patient's family reports she has a tendency to fall quite a bit at SNF mainly because patient feels like it takes too long for staff to get to her.  CSW discussed with patient and her family that unfortunately it can happen at any of the SNFs.  Patient's family feels that the facility is not staffed properly, and they have expressed their concerns with administration.   CSW discussed with patient and her family the role of CSW and process for getting her back to SNF.  Patient's family expressed understanding, and would like patient to return back to SNF.  CSW was given permission to send clinicals to SNF.  Patient and her family did not have any other questions or concerns.  They would like palliative to follow patient at SNF once she discharges in case her health deteriorates more.  Employment status:  Retired Forensic scientist:  Information systems manager, Medicaid In [place] PT Recommendations:  [place] / Referral to community resources:  [place]  Patient/Family's Response to care:  Patient and family are agreeable to having patient return back to [facility].  Patient/Family's Understanding of and Emotional Response to Diagnosis, Current Treatment, and Prognosis:  Patient and family are hopeful that patient will not have to be at hospital for very long and looking forward to her returning back to a familiar environment.  Emotional Assessment Appearance:  Appears stated age Attitude/Demeanor/Rapport:    Affect (typically observed):  Appropriate, Stable, Pleasant Orientation:  Oriented to Self, Oriented to Situation Alcohol / Substance use:  Not Applicable Psych involvement (Current and /or in the community):  No (Comment)  Discharge Needs  Concerns to be addressed:    Readmission within the last 30 days:  No Current discharge risk:  Cognitively Impaired Barriers to Discharge:  Continued Medical Work up   Ross Ludwig, [place] 06/30/2017, 1:06 PM

## 2017-06-30 NOTE — Consult Note (Signed)
Consultation Note Date: 06/30/2017   Patient [name]  DOB: 1926/09/22  MRN: [id number]  Age / Sex: 84 y.o., female  PCP: Kirk Ruths, MD Referring Physician: Dustin Flock, MD  Reason for Consultation: Establishing goals of care  HPI/Patient Profile: 84 y.o. female  with past medical history of ESRD on HD, CAD, CHF, (EF 20-25%), pacemaker, PVD, dementia, and frequent falls admitted on 06/28/2017 with a fall and troponin of 2.9. CT head negative. Not a candidate for medical intervention for elevated troponin. Patient has been seen by PMT multiple times before. Patient resides at [facility]. PMT consulted for [place].    Clinical Assessment and Goals of Care: I have reviewed medical records including EPIC notes, labs and imaging, received report from bedside RN, assessed the patient and then met at the bedside along with patient's brother, Ed,  to discuss diagnosis prognosis, GOC, EOL wishes, disposition and options.  I introduced Palliative Medicine as specialized medical care for people living with serious illness. It focuses on providing relief from the symptoms and stress of a serious illness. The goal is to improve quality of life for both the patient and the family.  We discussed a brief life review of the patient. She is widowed. Close to her brothers and sisters and their children. Enjoys time with her family.   As far as functional and nutritional status, her brother tells me of a decline. She is essentially bedbound and falls frequently. She cannot be restrained at [facility] for safety.     We discussed "[name]" current illness and what it means in the larger context of her on-going co-morbidities.  Natural disease trajectory and expectations at EOL were discussed.  I attempted to elicit values and goals of care important to the patient.  She wants to continue to enjoy time with her family as long as possible. We discussed  stopping HD - Ed understands that a time is coming soon when this may happen but at this point he would like to continue current care. He says that when her quality of life is no longer acceptable that is when he would choose to stop HD. At this time, patient enjoys going to HD and has a relationship with the staff at the clinic. He tells me multiple times he understands she is nearing the end of life.   The difference between aggressive medical intervention and comfort care was considered in light of the patient's goals of care. At this point, continue aggressive medical care.   Hospice and Palliative Care services outpatient were explained and offered. Patient is already being seen by palliative care outpatient and Ed is thankful for the support. We talked about hospice and when hospice would be appropriate and discussed that HD would need to be stopped first.   Ed spent a lot of time discussing frustrations with [name] current living situation - we talked through different options - Ed feels that her returning to her current SNF - [facility] is the best choice moving forward.   Questions and concerns were addressed. The family was encouraged to call with questions or concerns.    Primary Decision Maker HCPOA - brother, Ed joined by patient - patient's mental status fluctuates - during my evaluation she was confused    SUMMARY OF RECOMMENDATIONS   -continue palliative care outpatient at [facility] -not yet ready for hospice as they are not ready to stop HD -treat the treatable  Code Status/Advance Care Planning:  DNR  Symptom Management:   Per primary  Psycho-social/Spiritual:   Desire for further Chaplaincy support:yes - patient's pastor at bedside  Prognosis:   Unable to determine - poor prognosis r/t frequent falls, frequent hospitalizations, poor functional status, ESRD and cardiomyopathy - weeks-months  Discharge Planning: [facility] for rehab with  Palliative care service follow-up      Primary Diagnoses: Present on Admission: . Elevated troponin   I have reviewed the medical record, interviewed the patient and family, and examined the patient. The following aspects are pertinent.  Past Medical History:  Diagnosis Date  . Anemia   . Arthritis   . CAD (coronary artery disease)   . Cancer ([place])    skin  . Cardiomyopathy ([place])   . CHF (congestive heart failure) ([place])   . Depression   . IBS (irritable bowel syndrome) 2010  . Kidney failure July 2012   Hemodialysis 3xweek  . Kidney failure   . Meniere disease   . Meniere's disease   . Myocardial infarction ([place])   . OSA (obstructive sleep apnea)    CPAP  . Peritoneal dialysis status ([place])   . Presence of IVC filter 2019  . Presence of permanent cardiac pacemaker   . Renal insufficiency    Social History   Socioeconomic History  . Marital status: Widowed    Spouse name: Not on file  . Number of children: 0  . Years of education: Not on file  . Highest education level: Not on file  Occupational History  . Occupation: Retired   Scientific laboratory technician  . Financial resource strain: Not on file  . Food insecurity:    Worry: Not on file    Inability: Not on file  . Transportation needs:    Medical: Not on file    Non-medical: Not on file  Tobacco Use  . Smoking status: Former Smoker    Last attempt to quit: 03/31/1948    Years since quitting: 69.2  . Smokeless tobacco: Never Used  Substance and Sexual Activity  . Alcohol use: No  . Drug use: No  . Sexual activity: Never  Lifestyle  . Physical activity:    Days per week: Not on file    Minutes per session: Not on file  . Stress: Not on file  Relationships  . Social connections:    Talks on phone: Not on file    Gets together: Not on file    Attends religious service: Not on file    Active member of club or organization: Not on file    Attends meetings of clubs or organizations: Not on file    Relationship  status: Not on file  Other Topics Concern  . Not on file  Social History Narrative   Lives in [place] alone. Widow 2012.      Regular Exercise -  Housework   Daily Caffeine Use:  1 - 2 cups coffee            Family History  Problem Relation Age of Onset  . Cancer Mother        ? ovarian - sarcoma  . Cancer Father        Skin cancer  . Diabetes Sister   . COPD Sister   . Depression Sister   . Cancer Sister        Lung - 21 yrs old   Scheduled Meds: . ascorbic acid  250 mg Intravenous BID  . aspirin EC  81 mg Oral Daily  .  Chlorhexidine Gluconate Cloth  6 each Topical Q0600  . heparin  5,000 Units Subcutaneous Q8H  . levothyroxine  25 mcg Oral QAC breakfast  . metoprolol succinate  25 mg Oral Daily  . multivitamin  1 tablet Oral Daily  . mupirocin cream   Topical Daily  . sertraline  50 mg Oral QHS  . sevelamer carbonate  800 mg Oral TID WC  . Vitamin D (Ergocalciferol)  50,000 Units Oral Q Mon   Continuous Infusions: PRN Meds:.acetaminophen **OR** acetaminophen, ondansetron **OR** ondansetron (ZOFRAN) IV Allergies  Allergen Reactions  . Statins Other (See Comments)    Muscle weakness severe  . Codeine Sulfate Nausea And Vomiting  . Codeine Other (See Comments)    GI UPSET  . Effexor [Venlafaxine] Nausea Only  . Ezetimibe-Simvastatin Other (See Comments)    Muscle weakness   Review of Systems  Unable to perform ROS: Dementia    Physical Exam  Constitutional: She appears cachectic. She appears ill.  HENT:  Multiple bruises covering face  Cardiovascular: Normal rate and regular rhythm.  Pulmonary/Chest: Effort normal and breath sounds normal.  Abdominal: Soft.  Musculoskeletal:       Right lower leg: She exhibits no edema.       Left lower leg: She exhibits no edema.  Multiple bruises on body  Neurological: She is alert.  Orientation waxes and wanes - during my evaluation oriented to person and place, not time and situation  Skin: Skin is warm and  dry.  Psychiatric: Cognition and memory are impaired.    Vital Signs: BP (!) 95/43   Pulse 61   Temp 98 F (36.7 C) (Oral)   Resp 16   Ht _0  (1.626 m)   Wt 36.6 kg (80 lb 11.2 oz)   SpO2 97%   BMI 13.85 kg/m  Pain Scale: 0-10   Pain Score: 0-No pain   SpO2: SpO2: 97 % O2 Device:SpO2: 97 % O2 Flow Rate: .O2 Flow Rate (L/min): 2 L/min  IO: Intake/output summary:   Intake/Output Summary (Last 24 hours) at 06/30/2017 1500 Last data filed at 06/30/2017 0200 Gross per 24 hour  Intake 480 ml  Output 100 ml  Net 380 ml    LBM: Last BM Date: 06/30/17 Baseline Weight: Weight: 45.4 kg (100 lb) Most recent weight: Weight: 36.6 kg (80 lb 11.2 oz)     Palliative Assessment/Data:PPS 40%    Time Total: 70 minutes Greater than 50%  of this time was spent counseling and coordinating care related to the above assessment and plan.  Juel Burrow, DNP, AGNP-C Palliative Medicine Team [phone]

## 2017-06-30 NOTE — Progress Notes (Signed)
[place] at [facility]                                                                                                                                                                                  Patient Demographics   Momina Hunton, is a 84 y.o. female, DOB - 1926-12-21, BMW:[id number]  Admit date - 06/28/2017   Admitting Physician Henreitta Leber, MD  Outpatient Primary MD for the patient is Kirk Ruths, MD   LOS - 2  Subjective: Patient much more awake communicating now   Review of Systems:   CONSTITUTIONAL: Unable to provide poor historian  Vitals:   Vitals:   06/30/17 1015 06/30/17 1030 06/30/17 1045 06/30/17 1100  BP: 99/73 113/64 (!) 99/39 (!) 95/43  Pulse: 64 65  61  Resp: 16 20 13 16   Temp:      TempSrc:      SpO2: 97% 98%  97%  Weight:      Height:        Wt Readings from Last 3 Encounters:  06/29/17 36.6 kg (80 lb 11.2 oz)  06/20/17 47.2 kg (104 lb)  06/16/17 47.2 kg (104 lb)     Intake/Output Summary (Last 24 hours) at 06/30/2017 1505 Last data filed at 06/30/2017 0200 Gross per 24 hour  Intake 480 ml  Output 100 ml  Net 380 ml    Physical Exam:   GENERAL: Chronically ill-appearing HEAD, EYES, EARS, NOSE AND THROAT: Atraumatic, normocephalic. Extraocular muscles are intact. Pupils equal and reactive to light. Sclerae anicteric. No conjunctival injection. No oro-pharyngeal erythema.  NECK: Supple. There is no jugular venous distention. No bruits, no lymphadenopathy, no thyromegaly.  HEART: Regular rate and rhythm,. No murmurs, no rubs, no clicks.  LUNGS: Clear to auscultation bilaterally. No rales or rhonchi. No wheezes.  ABDOMEN: Soft, flat, nontender, nondistended. Has good bowel sounds. No hepatosplenomegaly appreciated.  EXTREMITIES: No evidence of any cyanosis, clubbing, or peripheral edema.  +2 pedal and radial pulses bilaterally.  NEUROLOGIC: Patient awake but not oriented sKIN: Multiple lesions on skin  bruising all Psych: Patient is awake  LN: No inguinal LN enlargement    Antibiotics   Anti-infectives (From admission, onward)   Start     Dose/Rate Route Frequency Ordered Stop   06/30/17 1515  levofloxacin (LEVAQUIN) tablet 500 mg     500 mg Oral Daily 06/30/17 1505        Medications   Scheduled Meds: . ascorbic acid  250 mg Intravenous BID  . aspirin EC  81 mg Oral Daily  . Chlorhexidine Gluconate Cloth  6 each Topical Q0600  . heparin  5,000 Units Subcutaneous Q8H  .  levofloxacin  500 mg Oral Daily  . levothyroxine  25 mcg Oral QAC breakfast  . metoprolol succinate  25 mg Oral Daily  . multivitamin  1 tablet Oral Daily  . mupirocin cream   Topical Daily  . sertraline  50 mg Oral QHS  . sevelamer carbonate  800 mg Oral TID WC  . Vitamin D (Ergocalciferol)  50,000 Units Oral Q Mon   Continuous Infusions: PRN Meds:.acetaminophen **OR** acetaminophen, ondansetron **OR** ondansetron (ZOFRAN) IV   Data Review:   Micro Results Recent Results (from the past 240 hour(s))  MRSA PCR Screening     Status: None   Collection Time: 06/28/17  1:58 PM  Result Value Ref Range Status   MRSA by PCR NEGATIVE NEGATIVE Final    Comment:        The GeneXpert MRSA Assay (FDA approved for NASAL specimens only), is one component of a comprehensive MRSA colonization surveillance program. It is not intended to diagnose MRSA infection nor to guide or monitor treatment for MRSA infections. Performed at [facility], [address] [place], [place] [address]     Radiology Reports Ct Head Wo Contrast  Result Date: 06/28/2017 CLINICAL DATA:  Unwitnessed fall with right frontal trauma. Right facial bruising and neck pain. EXAM: CT HEAD WITHOUT CONTRAST CT MAXILLOFACIAL WITHOUT CONTRAST CT CERVICAL SPINE WITHOUT CONTRAST TECHNIQUE: Multidetector CT imaging of the head, cervical spine, and maxillofacial structures were performed using the standard protocol without intravenous  contrast. Multiplanar CT image reconstructions of the cervical spine and maxillofacial structures were also generated. COMPARISON:  06/20/2017 FINDINGS: CT HEAD FINDINGS Brain: No evidence of acute infarction, hemorrhage, hydrocephalus, extra-axial collection or mass lesion/mass effect. Atrophy and chronic small vessel disease. Vascular: Atherosclerotic calcification. Skull: Right scalp hematoma without underlying fracture. CT MAXILLOFACIAL FINDINGS Osseous: Negative for fracture or mandibular dislocation. Bilateral TMJ degenerative arthropathy Orbits: Bilateral cataract resection. No evidence of injury. Sinuses: No evidence of injury Soft tissues: Swelling about the lower left jaw. No opaque foreign body. CT CERVICAL SPINE FINDINGS Alignment: No traumatic malalignment. C4-5 anterolisthesis, stable and degenerative Skull base and vertebrae: Negative for acute fracture. Remote T2 superior endplate fracture. Soft tissues and spinal canal: No prevertebral fluid or swelling. No visible canal hematoma. Disc levels: Upper cervical predominant degenerative facet spurring. Lower cervical predominant disc degeneration. Upper chest: Right pleural effusion again noted. Other: Right clavicle fracture with ORIF earlier this year. There is bulky callus about the fracture. IMPRESSION: 1. No evidence of acute intracranial or cervical spine injury. 2. Right scalp hematoma without fracture. 3. Negative for facial fracture. Electronically Signed   By: Monte Fantasia M.D.   On: 06/28/2017 07:37   Ct Head Wo Contrast  Result Date: 06/20/2017 CLINICAL DATA:  Several recent falls EXAM: CT HEAD WITHOUT CONTRAST CT CERVICAL SPINE WITHOUT CONTRAST TECHNIQUE: Multidetector CT imaging of the head and cervical spine was performed following the standard protocol without intravenous contrast. Multiplanar CT image reconstructions of the cervical spine were also generated. COMPARISON:  CT head and CT cervical spine Jun 16, 2017 FINDINGS: CT  HEAD FINDINGS Brain: Mild diffuse atrophy is stable. There is no intracranial mass, hemorrhage, extra-axial fluid collection, or midline shift. There is patchy small vessel disease throughout the centra semiovale bilaterally, stable. No evident acute infarct. Vascular: No hyperdense vessel. There is calcification in each distal vertebral artery and in each carotid siphon. Skull: Scalp hematomas along the right frontal and temporal regions are again noted. Scalp hematoma to the left of midline in  the frontal region is smaller compared to recent study. Bony calvarium appears intact. Sinuses/Orbits: There is mucosal thickening in several ethmoid air cells. Other visualized paranasal sinuses are clear. Visualized orbits appear symmetric bilaterally. Other: Mastoid air cells are clear. CT CERVICAL SPINE FINDINGS Alignment: There is 1 mm of anterolisthesis of C3 on C4. There is 1 mm of anterolisthesis of C4 on C5. There is 1 mm of retrolisthesis of C5 on C6. There is 2 mm of retrolisthesis of C6 on C7. Skull base and vertebrae: The skull base and craniocervical junction regions appear normal. Bones are osteoporotic. No fracture is evident. No blastic or lytic bone lesions are evident. Soft tissues and spinal canal: Prevertebral soft tissues and predental space regions are normal. No paraspinous lesion. No evident cord or canal hematoma. Disc levels: There is moderately severe disc space narrowing at C5-6 and C6-7. There is slightly milder disc space narrowing at C4-5. There is facet osteoarthritic change at multiple levels bilaterally. There is exit foraminal narrowing on the left due to bony hypertrophy with impression on the exiting nerve root due to bony hypertrophy at this level. Similar changes are noted at C5-6 bilaterally and at C6-7 bilaterally. No frank disc extrusion or high-grade stenosis evident. Upper chest: There is a sizable free-flowing pleural effusion on the right. Upper lung zones elsewhere clear.  Other: There is calcification in each subclavian and carotid artery. There is a nodular lesion in the right lobe of the thyroid measuring 1.5 x 1.5 cm. IMPRESSION: CT head: Stable atrophy with supratentorial small vessel disease. No acute infarct. No mass or hemorrhage. Scalp hematomas are stable on the right and significantly smaller to the left of midline in the frontal region. No fracture evident. Foci of arterial vascular calcification noted. Mucosal thickening noted in several ethmoid air cells. CT cervical spine: No fracture. Areas of mild spondylolisthesis appear due to underlying spondylosis. There is multilevel arthropathy. Sizable free-flowing pleural effusion on the right. Foci of vascular calcification in subclavian and carotid arteries bilaterally. **An incidental finding of potential clinical significance has been found. 1.5 x 1.5 cm right thyroid nodular lesion. Consider further evaluation with thyroid ultrasound nonemergently. If patient is clinically hyperthyroid, consider nuclear medicine thyroid uptake and scan.** Electronically Signed   By: Lowella Grip III M.D.   On: 06/20/2017 19:57   Ct Head Wo Contrast  Result Date: 06/16/2017 CLINICAL DATA:  Fall with hematoma to forehead. EXAM: CT HEAD WITHOUT CONTRAST CT CERVICAL SPINE WITHOUT CONTRAST TECHNIQUE: Multidetector CT imaging of the head and cervical spine was performed following the standard protocol without intravenous contrast. Multiplanar CT image reconstructions of the cervical spine were also generated. COMPARISON:  CT of the head on 06/11/2017 and CT of the cervical spine on 04/21/2017. FINDINGS: CT HEAD FINDINGS Brain: Stable small vessel disease in the periventricular white matter. The brain demonstrates no evidence of acute hemorrhage, infarction, edema, mass effect, extra-axial fluid collection, hydrocephalus or mass lesion. Vascular: No hyperdense vessel or unexpected calcification. Skull: Recent right parietal scalp  hematoma is smaller in size. There is a new right and midline frontal scalp hematoma present. No underlying acute skull fracture is identified. Sinuses/Orbits: No acute finding. Other: None. CT CERVICAL SPINE FINDINGS Alignment: Normal alignment without subluxation. Skull base and vertebrae: No evidence of cervical spine fracture. No bony lesions or destruction identified. Soft tissues and spinal canal: No prevertebral soft tissue swelling. Disc levels: Stable degenerative disc disease at C5-6 and C6-7. Mild degenerative disc disease at C4-5. Upper chest: Right  pleural fluid seen at the apex. IMPRESSION: 1. New right frontal and midline scalp hematoma without skull fracture or acute intracranial findings. The recent right parietal scalp hematoma is smaller in size. 2. No evidence of acute cervical spine injury. Stable degenerative disc disease. 3. Right-sided pleural effusion seen in the upper right hemithorax. Electronically Signed   By: Aletta Edouard M.D.   On: 06/16/2017 18:32   Ct Head Wo Contrast  Result Date: 06/11/2017 CLINICAL DATA:  Unwitnessed fall. EXAM: CT HEAD WITHOUT CONTRAST TECHNIQUE: Contiguous axial images were obtained from the base of the skull through the vertex without intravenous contrast. COMPARISON:  April 21, 2017 FINDINGS: Brain: No subdural, epidural, or subarachnoid hemorrhage. Cerebellum, brainstem, and basal cisterns are normal. Ventricles and sulci are normal. White matter changes persist. No acute cortical ischemia or infarct. No mass effect or midline shift. Vascular: Calcified atherosclerosis is seen in the intracranial carotids. Skull: Normal. Negative for fracture or focal lesion. Sinuses/Orbits: No acute finding. Other: Hematoma over the right lateral scalp. Extracranial soft tissues are otherwise normal. IMPRESSION: 1. Hematoma over the right lateral scalp. No underlying fracture identified. 2. No acute intracranial abnormality identified. Chronic white matter changes.  Electronically Signed   By: Dorise Bullion III M.D   On: 06/11/2017 11:55   Ct Chest Wo Contrast  Result Date: 06/16/2017 CLINICAL DATA:  Status post multiple falls with right shoulder pain and scattered bruising. EXAM: CT CHEST WITHOUT CONTRAST TECHNIQUE: Multidetector CT imaging of the chest was performed following the standard protocol without IV contrast. COMPARISON:  Chest CT dated 01/01/2017 FINDINGS: Cardiovascular: Enlarged heart. Biventricular pacemaker in place. No evidence of pericardial effusion. Calcific atherosclerotic disease of the coronary arteries and aorta. Mediastinum/Nodes: No enlarged mediastinal or axillary lymph nodes. Thyroid gland, trachea, and esophagus demonstrate no significant findings. Lungs/Pleura: Large layering right pleural effusion with compressive atelectasis of the right lower lobe and near complete collapse of the right middle lobe. Upper Abdomen: Atrophic kidneys.  Vascular calcifications. Musculoskeletal: There has been a prior side plate and screw fixation through distal right clavicular fracture. The screws are anchored within the distal fracture fragment and posttraumatic callus. There is stable deformity with significant angulation at the proximal fracture site. There is longitudinal linear lucency through the proximal right clavicle with scant overlying callus formation, which likely represents a nonunion fracture. No evidence of spinal rib fractures. There is a healing or healed sternal body fracture. IMPRESSION: Large layering right pleural effusion with compressive atelectasis of the right lower lobe and near complete collapse of the right middle lobe. Enlarged heart. Calcific atherosclerotic disease of the coronary arteries and aorta. Sideplate and screw fixation of prior comminuted right clavicular fracture with screws which are anchored to the distal fracture fragment and callus. Sharp angulation at the proximal fracture line, which appears stable from the  radiograph dated 05/25/2017. Healing or healed sternal body fracture. No evidence of acute injury. Aortic Atherosclerosis (ICD10-I70.0). Electronically Signed   By: Fidela Salisbury M.D.   On: 06/16/2017 18:43   Ct Cervical Spine Wo Contrast  Result Date: 06/28/2017 CLINICAL DATA:  Unwitnessed fall with right frontal trauma. Right facial bruising and neck pain. EXAM: CT HEAD WITHOUT CONTRAST CT MAXILLOFACIAL WITHOUT CONTRAST CT CERVICAL SPINE WITHOUT CONTRAST TECHNIQUE: Multidetector CT imaging of the head, cervical spine, and maxillofacial structures were performed using the standard protocol without intravenous contrast. Multiplanar CT image reconstructions of the cervical spine and maxillofacial structures were also generated. COMPARISON:  06/20/2017 FINDINGS: CT HEAD FINDINGS Brain: No evidence  of acute infarction, hemorrhage, hydrocephalus, extra-axial collection or mass lesion/mass effect. Atrophy and chronic small vessel disease. Vascular: Atherosclerotic calcification. Skull: Right scalp hematoma without underlying fracture. CT MAXILLOFACIAL FINDINGS Osseous: Negative for fracture or mandibular dislocation. Bilateral TMJ degenerative arthropathy Orbits: Bilateral cataract resection. No evidence of injury. Sinuses: No evidence of injury Soft tissues: Swelling about the lower left jaw. No opaque foreign body. CT CERVICAL SPINE FINDINGS Alignment: No traumatic malalignment. C4-5 anterolisthesis, stable and degenerative Skull base and vertebrae: Negative for acute fracture. Remote T2 superior endplate fracture. Soft tissues and spinal canal: No prevertebral fluid or swelling. No visible canal hematoma. Disc levels: Upper cervical predominant degenerative facet spurring. Lower cervical predominant disc degeneration. Upper chest: Right pleural effusion again noted. Other: Right clavicle fracture with ORIF earlier this year. There is bulky callus about the fracture. IMPRESSION: 1. No evidence of acute  intracranial or cervical spine injury. 2. Right scalp hematoma without fracture. 3. Negative for facial fracture. Electronically Signed   By: Monte Fantasia M.D.   On: 06/28/2017 07:37   Ct Cervical Spine Wo Contrast  Result Date: 06/20/2017 CLINICAL DATA:  Several recent falls EXAM: CT HEAD WITHOUT CONTRAST CT CERVICAL SPINE WITHOUT CONTRAST TECHNIQUE: Multidetector CT imaging of the head and cervical spine was performed following the standard protocol without intravenous contrast. Multiplanar CT image reconstructions of the cervical spine were also generated. COMPARISON:  CT head and CT cervical spine Jun 16, 2017 FINDINGS: CT HEAD FINDINGS Brain: Mild diffuse atrophy is stable. There is no intracranial mass, hemorrhage, extra-axial fluid collection, or midline shift. There is patchy small vessel disease throughout the centra semiovale bilaterally, stable. No evident acute infarct. Vascular: No hyperdense vessel. There is calcification in each distal vertebral artery and in each carotid siphon. Skull: Scalp hematomas along the right frontal and temporal regions are again noted. Scalp hematoma to the left of midline in the frontal region is smaller compared to recent study. Bony calvarium appears intact. Sinuses/Orbits: There is mucosal thickening in several ethmoid air cells. Other visualized paranasal sinuses are clear. Visualized orbits appear symmetric bilaterally. Other: Mastoid air cells are clear. CT CERVICAL SPINE FINDINGS Alignment: There is 1 mm of anterolisthesis of C3 on C4. There is 1 mm of anterolisthesis of C4 on C5. There is 1 mm of retrolisthesis of C5 on C6. There is 2 mm of retrolisthesis of C6 on C7. Skull base and vertebrae: The skull base and craniocervical junction regions appear normal. Bones are osteoporotic. No fracture is evident. No blastic or lytic bone lesions are evident. Soft tissues and spinal canal: Prevertebral soft tissues and predental space regions are normal. No  paraspinous lesion. No evident cord or canal hematoma. Disc levels: There is moderately severe disc space narrowing at C5-6 and C6-7. There is slightly milder disc space narrowing at C4-5. There is facet osteoarthritic change at multiple levels bilaterally. There is exit foraminal narrowing on the left due to bony hypertrophy with impression on the exiting nerve root due to bony hypertrophy at this level. Similar changes are noted at C5-6 bilaterally and at C6-7 bilaterally. No frank disc extrusion or high-grade stenosis evident. Upper chest: There is a sizable free-flowing pleural effusion on the right. Upper lung zones elsewhere clear. Other: There is calcification in each subclavian and carotid artery. There is a nodular lesion in the right lobe of the thyroid measuring 1.5 x 1.5 cm. IMPRESSION: CT head: Stable atrophy with supratentorial small vessel disease. No acute infarct. No mass or hemorrhage. Scalp hematomas are  stable on the right and significantly smaller to the left of midline in the frontal region. No fracture evident. Foci of arterial vascular calcification noted. Mucosal thickening noted in several ethmoid air cells. CT cervical spine: No fracture. Areas of mild spondylolisthesis appear due to underlying spondylosis. There is multilevel arthropathy. Sizable free-flowing pleural effusion on the right. Foci of vascular calcification in subclavian and carotid arteries bilaterally. **An incidental finding of potential clinical significance has been found. 1.5 x 1.5 cm right thyroid nodular lesion. Consider further evaluation with thyroid ultrasound nonemergently. If patient is clinically hyperthyroid, consider nuclear medicine thyroid uptake and scan.** Electronically Signed   By: Lowella Grip III M.D.   On: 06/20/2017 19:57   Ct Cervical Spine Wo Contrast  Result Date: 06/16/2017 CLINICAL DATA:  Fall with hematoma to forehead. EXAM: CT HEAD WITHOUT CONTRAST CT CERVICAL SPINE WITHOUT CONTRAST  TECHNIQUE: Multidetector CT imaging of the head and cervical spine was performed following the standard protocol without intravenous contrast. Multiplanar CT image reconstructions of the cervical spine were also generated. COMPARISON:  CT of the head on 06/11/2017 and CT of the cervical spine on 04/21/2017. FINDINGS: CT HEAD FINDINGS Brain: Stable small vessel disease in the periventricular white matter. The brain demonstrates no evidence of acute hemorrhage, infarction, edema, mass effect, extra-axial fluid collection, hydrocephalus or mass lesion. Vascular: No hyperdense vessel or unexpected calcification. Skull: Recent right parietal scalp hematoma is smaller in size. There is a new right and midline frontal scalp hematoma present. No underlying acute skull fracture is identified. Sinuses/Orbits: No acute finding. Other: None. CT CERVICAL SPINE FINDINGS Alignment: Normal alignment without subluxation. Skull base and vertebrae: No evidence of cervical spine fracture. No bony lesions or destruction identified. Soft tissues and spinal canal: No prevertebral soft tissue swelling. Disc levels: Stable degenerative disc disease at C5-6 and C6-7. Mild degenerative disc disease at C4-5. Upper chest: Right pleural fluid seen at the apex. IMPRESSION: 1. New right frontal and midline scalp hematoma without skull fracture or acute intracranial findings. The recent right parietal scalp hematoma is smaller in size. 2. No evidence of acute cervical spine injury. Stable degenerative disc disease. 3. Right-sided pleural effusion seen in the upper right hemithorax. Electronically Signed   By: Aletta Edouard M.D.   On: 06/16/2017 18:32   Ct T-spine No Charge  Result Date: 06/16/2017 CLINICAL DATA:  Fall today. EXAM: CT THORACIC SPINE WITHOUT CONTRAST TECHNIQUE: Multidetector CT images of the thoracic were obtained using the standard protocol without intravenous contrast. COMPARISON:  None. FINDINGS: Alignment: The thoracic  spine shows normal alignment. Vertebrae: No evidence of fracture.  No bony lesions. Paraspinal and other soft tissues: No paravertebral soft tissue abnormalities. Disc levels: Minimal degenerative disc disease and osteophyte formation. Moderate to large right pleural effusion present. IMPRESSION: No evidence of acute thoracic spine injury. Moderate to large right pleural effusion. Electronically Signed   By: Aletta Edouard M.D.   On: 06/16/2017 18:36   Dg Chest Portable 1 View  Result Date: 06/28/2017 CLINICAL DATA:  84 year old female post fall.  Initial encounter. EXAM: PORTABLE CHEST 1 VIEW COMPARISON:  05/25/2017 chest x-ray.  06/16/2017 chest CT. FINDINGS: Slight increase in size of moderately large right-sided pleural effusion. Persistent consolidation right middle lobe and right lung base. Cardiomegaly with biventricular pacer in place. Right central line tip caval atrial junction. No pneumothorax. Calcified tortuous aorta. Remote right clavicle fracture with angulation of fracture fragments and prominent callus formation. Clavicle plate and screws in place. No new fracture noted.  IMPRESSION: Slight increase in size of moderately large right-sided pleural effusion. Persistent consolidation right middle lobe and right lung base. Cardiomegaly with biventricular pacer in place. Right central line tip caval atrial junction. Aortic Atherosclerosis (ICD10-I70.0). Remote right clavicle fracture with angulation of fracture fragments and prominent callus formation. Clavicle plate and screws in place. Electronically Signed   By: Genia Del M.D.   On: 06/28/2017 08:43   Ct Maxillofacial Wo Contrast  Result Date: 06/28/2017 CLINICAL DATA:  Unwitnessed fall with right frontal trauma. Right facial bruising and neck pain. EXAM: CT HEAD WITHOUT CONTRAST CT MAXILLOFACIAL WITHOUT CONTRAST CT CERVICAL SPINE WITHOUT CONTRAST TECHNIQUE: Multidetector CT imaging of the head, cervical spine, and maxillofacial structures  were performed using the standard protocol without intravenous contrast. Multiplanar CT image reconstructions of the cervical spine and maxillofacial structures were also generated. COMPARISON:  06/20/2017 FINDINGS: CT HEAD FINDINGS Brain: No evidence of acute infarction, hemorrhage, hydrocephalus, extra-axial collection or mass lesion/mass effect. Atrophy and chronic small vessel disease. Vascular: Atherosclerotic calcification. Skull: Right scalp hematoma without underlying fracture. CT MAXILLOFACIAL FINDINGS Osseous: Negative for fracture or mandibular dislocation. Bilateral TMJ degenerative arthropathy Orbits: Bilateral cataract resection. No evidence of injury. Sinuses: No evidence of injury Soft tissues: Swelling about the lower left jaw. No opaque foreign body. CT CERVICAL SPINE FINDINGS Alignment: No traumatic malalignment. C4-5 anterolisthesis, stable and degenerative Skull base and vertebrae: Negative for acute fracture. Remote T2 superior endplate fracture. Soft tissues and spinal canal: No prevertebral fluid or swelling. No visible canal hematoma. Disc levels: Upper cervical predominant degenerative facet spurring. Lower cervical predominant disc degeneration. Upper chest: Right pleural effusion again noted. Other: Right clavicle fracture with ORIF earlier this year. There is bulky callus about the fracture. IMPRESSION: 1. No evidence of acute intracranial or cervical spine injury. 2. Right scalp hematoma without fracture. 3. Negative for facial fracture. Electronically Signed   By: Monte Fantasia M.D.   On: 06/28/2017 07:37     CBC Recent Labs  Lab 06/28/17 0906 06/28/17 1652 06/29/17 0719 06/30/17 0945  WBC 9.9 8.6 8.3 7.2  HGB 11.3* 11.5* 10.7* 10.8*  HCT 34.1* 35.0 33.0* 33.4*  PLT 263 246 245 254  MCV 94.3 94.9 94.7 95.2  MCH 31.4 31.2 30.7 30.8  MCHC 33.3 32.8 32.5 32.3  RDW 21.2* 21.4* 21.5* 20.8*  LYMPHSABS 0.6*  --   --   --   MONOABS 0.8  --   --   --   EOSABS 0.0  --   --    --   BASOSABS 0.0  --   --   --     Chemistries  Recent Labs  Lab 06/28/17 0906 06/29/17 0719 06/30/17 0945  NA 140 138 138  K 5.6* 3.7 3.6  CL 99* 97* 97*  CO2 20* 27 27  GLUCOSE 110* 71 109*  BUN 73* 35* 56*  CREATININE 5.60* 3.01* 4.35*  CALCIUM 9.2 8.3* 8.0*  AST 130*  --   --   ALT 69*  --   --   ALKPHOS 179*  --   --   BILITOT 3.6*  --   --    ------------------------------------------------------------------------------------------------------------------ estimated creatinine clearance is 5.2 mL/min (A) (by C-G formula based on SCr of 4.35 mg/dL (H)). ------------------------------------------------------------------------------------------------------------------ No results for input(s): HGBA1C in the last 72 hours. ------------------------------------------------------------------------------------------------------------------ No results for input(s): CHOL, HDL, LDLCALC, TRIG, CHOLHDL, LDLDIRECT in the last 72 hours. ------------------------------------------------------------------------------------------------------------------ No results for input(s): TSH, T4TOTAL, T3FREE, THYROIDAB in the last 72 hours.  Invalid  input(s): FREET3 ------------------------------------------------------------------------------------------------------------------ No results for input(s): VITAMINB12, FOLATE, FERRITIN, TIBC, IRON, RETICCTPCT in the last 72 hours.  Coagulation profile No results for input(s): INR, PROTIME in the last 168 hours.  No results for input(s): DDIMER in the last 72 hours.  Cardiac Enzymes Recent Labs  Lab 06/28/17 1259 06/28/17 1652 06/28/17 2023  TROPONINI 2.64* 3.00* 2.96*   ------------------------------------------------------------------------------------------------------------------ Invalid input(s): POCBNP    Assessment & Plan   84 year old female with past medical history of end-stage renal disease on hemodialysis, obstructive sleep  apnea, dementia, history of recurrent falls, coronary artery disease, ischemic cardiomyopathy ejection fraction of 20 to 12%, chronic systolic CHF, secondary hyperparathyroidism who presents to the hospital with a fall and noted to have an elevated troponin of 2.9.  1.  Altered mental status/encephalopathy- CT scan of the head unrevealing Patient currently improved  2.  Elevated troponin- possible non-ST MI not a candidate for any type of intervention medical management Echo shows no significant changes compared to previous  3.    Possible pneumonia based on chest x-ray as well as elevated procalcitonin We will start patient on oral Levaquin  4.  Essential hypertension-continue Toprol.  5.  Hypothyroidism-continue Synthroid.  6.  Secondary hyperparathyroidism-continue Renvela.  7. End-stage renal disease on hemodialysis-patient gets dialysis on Monday Wednesday and Friday. Nephrology consult appreciated  Poor prognosis recurrent falls     Code Status Orders  (From admission, onward)        Start     Ordered   06/29/17 0905  Do not attempt resuscitation (DNR)  Continuous    Question Answer Comment  In the event of cardiac or respiratory ARREST Do not call a "code blue"   In the event of cardiac or respiratory ARREST Do not perform Intubation, CPR, defibrillation or ACLS   In the event of cardiac or respiratory ARREST Use medication by any route, position, wound care, and other measures to relive pain and suffering. May use oxygen, suction and manual treatment of airway obstruction as needed for comfort.      06/29/17 0904    Code Status History    Date Active Date Inactive Code Status Order ID Comments User Context   06/28/2017 1239 06/29/2017 0904 DNR [id number]  Henreitta Leber, MD Inpatient   05/24/2017 2258 06/01/2017 1706 DNR [id number]  Vaughan Basta, MD Inpatient   02/16/2017 1928 02/17/2017 1949 Full Code [id number]  Poggi, Marshall Cork, MD Inpatient   02/03/2017 1230  02/04/2017 1907 DNR [id number]  Nicholes Mango, MD ED   01/01/2017 2027 01/08/2017 1858 Full Code [id number]  [name], MD ED   05/12/2016 0809 05/14/2016 1439 DNR [id number]  Max Sane, MD Inpatient   05/10/2016 0506 05/12/2016 0809 Full Code [id number]  Saundra Shelling, MD Inpatient   12/15/2015 1515 12/19/2015 0159 Full Code [id number]  Oletta Cohn, DO Inpatient   12/14/2015 1613 12/15/2015 1515 Full Code [id number]  Bettey Costa, MD Inpatient    Advance Directive Documentation     Most Recent Value  Type of Advance Directive  Out of facility DNR (pink MOST or yellow form)  Pre-existing out of facility DNR order (yellow form or pink MOST form)  Physician notified to receive inpatient order  "MOST" Form in Place?  -           Consults nephrology, cardiology  DVT Prophylaxis heparin  Lab Results  Component Value Date   PLT 254 06/30/2017     Time Spent in minutes 35 minutes greater than 50% of time spent in  care coordination and counseling patient regarding the condition and plan of care.   Dustin Flock M.D on 06/30/2017 at 3:05 PM  Between 7am to 6pm - Pager - [phone]  After 6pm go to [email or web address] - Proofreader  [employer]   Office  [phone]

## 2017-06-30 NOTE — Progress Notes (Signed)
Pre HD assessment    06/30/17 0845  Neurological  Level of Consciousness Alert  Orientation Level Oriented X4  Respiratory  Respiratory Pattern Regular;Unlabored  Chest Assessment Chest expansion symmetrical  Bilateral Breath Sounds Diminished  Cardiac  Pulse Regular  ECG Monitor Yes  Vascular  R Radial Pulse +2  L Radial Pulse +2  Edema Generalized  Psychosocial  Psychosocial (WDL) WDL

## 2017-06-30 NOTE — NC FL2 (Signed)
[place] LEVEL OF CARE SCREENING TOOL     IDENTIFICATION  Patient [name] Birthdate: February 02, 1926 Sex: female Admission Date (Current Location): 06/28/2017  [place] and [place] Number:  Selena Lesser [id number] [place] and Address:  [facility], [address] [place], [place] [address]      Provider Number: [id number]  Attending Physician Name and Address:  Dustin Flock, MD  Relative Name and Phone Number:  Riddle,Edward Brother [phone] or Dunn,Tracy Niece [phone]     Current Level of Care: Hospital Recommended Level of Care: [place] Prior Approval Number:    Date Approved/Denied:   PASRR Number: [id number]  Discharge Plan: SNF    Current Diagnoses: Patient Active Problem List   Diagnosis Date Noted  . Elevated troponin 06/28/2017  . Swelling of limb 06/08/2017  . C. difficile diarrhea   . Palliative care encounter   . Encounter for hospice care discussion   . Protein-calorie malnutrition, severe 05/25/2017  . HCAP (healthcare-associated pneumonia) 05/24/2017  . Complication from renal dialysis device 04/01/2017  . Displaced fracture of lateral end of right clavicle, initial encounter for closed fracture 02/16/2017  . FTT (failure to thrive) in adult 02/03/2017  . ESRD on hemodialysis ([place])   . Pleural effusion   . Palliative care by specialist   . Pulmonary embolism ([place]) 01/02/2017  . Syncope 01/01/2017  . Vision changes 08/27/2016  . Closed fracture of neck of femur ([place]) 05/26/2016  . Hip fracture ([place]) 05/10/2016  . Falls 04/27/2016  . Head injury 04/27/2016  . Age-related osteoporosis with current pathological fracture with routine healing 04/24/2016  . Recurrent falls 04/24/2016  . Mild protein-calorie malnutrition ([place]) 04/24/2016  . ESRD on dialysis ([place]) 04/10/2016  . Periprosthetic fracture around internal prosthetic left knee joint 01/26/2016  . Pressure injury of skin  12/15/2015  . Closed left subtrochanteric femur fracture ([place]) 12/15/2015  . Femur fracture, left ([place]) 12/14/2015  . Meniere disease   . Loss of weight 10/21/2015  . Clinical depression 06/20/2015  . Dysphagia 05/24/2015  . Imbalance 04/22/2015  . Chronic pain syndrome 05/22/2014  . Insomnia 03/13/2014  . Anxiety state 03/13/2014  . Chronic systolic CHF (congestive heart failure), NYHA class 3 ([place]) 01/15/2014  . OSA (obstructive sleep apnea) 01/15/2014  . Basal cell carcinoma of neck 11/14/2013  . DDD (degenerative disc disease), cervical 11/07/2013  . Cervical radiculitis 10/16/2013  . Benign essential hypertension 09/12/2013  . Memory loss 09/12/2013  . Systolic heart failure, chronic ([place]) 05/12/2013  . Goals of care, counseling/discussion 11/10/2012  . Sinus node dysfunction ([place]) 08/01/2012  . Low back pain 08/01/2012  . Cervical spine pain 05/02/2012  . Irritable bowel syndrome 11/02/2011  . Depression 04/01/2011  . Hypertension 04/01/2011    Orientation RESPIRATION BLADDER Height & Weight     Self, Situation  Normal Incontinent Weight: 80 lb 11.2 oz (36.6 kg) Height:  5\' 4"  (162.6 cm)  BEHAVIORAL SYMPTOMS/MOOD NEUROLOGICAL BOWEL NUTRITION STATUS      Incontinent Diet(Dysphagia 1 diet)  AMBULATORY STATUS COMMUNICATION OF NEEDS Skin   Extensive Assist Verbally Surgical wounds                       Personal Care Assistance Level of Assistance  Bathing, Feeding, Dressing Bathing Assistance: Limited assistance Feeding assistance: Limited assistance Dressing Assistance: Limited assistance     Functional Limitations Info  Sight, Hearing, Speech Sight Info: Adequate Hearing Info: Impaired Speech Info: Adequate    SPECIAL  CARE FACTORS FREQUENCY  PT (By licensed PT)     PT Frequency: 5x a week              Contractures Contractures Info: Not present    Additional Factors Info  Allergies, Code Status, Psychotropic Code Status Info: DNR Allergies  Info: STATINS, CODEINE SULFATE, CODEINE, EFFEXOR VENLAFAXINE, EZETIMIBE-SIMVASTATIN  Psychotropic Info: sertraline (ZOLOFT) tablet 50 mg          Current Medications (06/30/2017):  This is the current hospital active medication list Current Facility-Administered Medications  Medication Dose Route Frequency Provider Last Rate Last Dose  . acetaminophen (TYLENOL) tablet 650 mg  650 mg Oral Q6H PRN Henreitta Leber, MD       Or  . acetaminophen (TYLENOL) suppository 650 mg  650 mg Rectal Q6H PRN Henreitta Leber, MD   650 mg at 06/29/17 2320  . ascorbic acid injection 250 mg  250 mg Intravenous BID Kolluru, Lurena Nida, MD   250 mg at 06/29/17 2308  . aspirin EC tablet 81 mg  81 mg Oral Daily Henreitta Leber, MD   81 mg at 06/30/17 1352  . Chlorhexidine Gluconate Cloth 2 % PADS 6 each  6 each Topical Q0600 Kolluru, Lurena Nida, MD   6 each at 06/30/17 0538  . heparin injection 5,000 Units  5,000 Units Subcutaneous Q8H Henreitta Leber, MD   5,000 Units at 06/30/17 1325  . levothyroxine (SYNTHROID, LEVOTHROID) tablet 25 mcg  25 mcg Oral QAC breakfast Henreitta Leber, MD   25 mcg at 06/30/17 0759  . metoprolol succinate (TOPROL-XL) 24 hr tablet 25 mg  25 mg Oral Daily Henreitta Leber, MD   25 mg at 06/30/17 1352  . multivitamin (RENA-VIT) tablet 1 tablet  1 tablet Oral Daily Henreitta Leber, MD   1 tablet at 06/30/17 1352  . mupirocin cream (BACTROBAN) 2 %   Topical Daily Dustin Flock, MD      . ondansetron (ZOFRAN) tablet 4 mg  4 mg Oral Q6H PRN Henreitta Leber, MD       Or  . ondansetron (ZOFRAN) injection 4 mg  4 mg Intravenous Q6H PRN Henreitta Leber, MD   4 mg at 06/28/17 1430  . sertraline (ZOLOFT) tablet 50 mg  50 mg Oral QHS Dustin Flock, MD   50 mg at 06/29/17 2144  . sevelamer carbonate (RENVELA) tablet 800 mg  800 mg Oral TID WC Henreitta Leber, MD   800 mg at 06/30/17 0759  . Vitamin D (Ergocalciferol) (DRISDOL) capsule 50,000 Units  50,000 Units Oral Q Mon Sainani, Belia Heman, MD        Facility-Administered Medications Ordered in Other Encounters  Medication Dose Route Frequency Provider Last Rate Last Dose  . ceFAZolin (ANCEF) IVPB 1 g/50 mL premix  1 g Intravenous Once Stegmayer, Janalyn Harder, PA-C         Discharge Medications: Please see discharge summary for a list of discharge medications.  Relevant Imaging Results:  Relevant Lab Results:   Additional Information Hemodialysis   SSN: [id number]  Ross Ludwig, LCSWA

## 2017-07-01 MED ORDER — LEVOFLOXACIN 500 MG PO TABS: 500.0000 mg | ORAL_TABLET | Freq: Every day | ORAL | 0 refills | Status: AC

## 2017-07-01 MED ORDER — TRAMADOL HCL 50 MG PO TABS: 50.0000 mg | ORAL_TABLET | Freq: Four times a day (QID) | ORAL | 0 refills | Status: DC | PRN

## 2017-07-01 MED ORDER — SODIUM CHLORIDE 0.9% FLUSH
3.0000 mL | Freq: Two times a day (BID) | INTRAVENOUS | Status: DC
  Administered 2017-07-01: 3 mL via INTRAVENOUS

## 2017-07-01 NOTE — Progress Notes (Signed)
Dressing change to left wrist, right knee, right shoulder, right elbow, right forearm.  Wounds cleansed with NS, wound bed covered with Bactroban, covered with foam dressing.

## 2017-07-01 NOTE — Clinical Social Work Note (Addendum)
Patient to be d/c'ed today to [facility] room 302A.  Patient and family agreeable to plans will transport via ems RN to call report [phone].  CSW updated patient's brother Percell Miller [phone] that patient will be discharging today.   Evette Cristal, MSW, [place]

## 2017-07-01 NOTE — Progress Notes (Signed)
Discharged to [facility] via EMS.  Brother, Percell Miller, notified of her transport.

## 2017-07-01 NOTE — Discharge Instructions (Signed)
Wound Care: right elbow, right shoulder, right forearm, right knee, left wrist.  Cleanse with NS.  Apply mupirocin ointment to wound bed.  Cover with silicone foam dressing.  Change daily and prn for 14 days, starting on 06/29/17.

## 2017-07-01 NOTE — Discharge Summary (Signed)
[employer] [facility], 84 y.o., DOB 1926/07/15, MRN [id number]. Admission date: 06/28/2017 Discharge Date 07/01/2017 Primary MD Kirk Ruths, MD Admitting Physician Henreitta Leber, MD  Admission Diagnosis  Elevated troponin [R74.8] Fall, initial encounter [W19.XXXA] Urinary tract infection with hematuria, site unspecified [N39.0, R31.9]  Discharge Diagnosis   Active Problems: Acute metabolic encephalopathy Non-ST MI End-stage renal disease Essential hypertension Hypothyroid Secondary hyperparathyroidism Depression Possible pneumonia  Hospital Course 84 year old female with past medical history of end-stage renal disease on hemodialysis, obstructive sleep apnea, dementia, history of recurrent falls, coronary artery disease, ischemic cardiomyopathy ejection fraction of 20 to 57%, chronic systolic CHF, secondary hyperparathyroidism who presents to the hospital with a fall and noted to have an elevated troponin of 2.9.  Patient has a recurrent episodes of syncope and passing out.  Presented with similar type of presentation.  Patient this time was noted to have a non-ST MI.  Due to her advanced age and multiple other problems she was medically treated.  Patient initially was not awakening and then started responding normally.  She is back to baseline.  Echocardiogram did show pulmonary hypertension.  She also is noted to have some hypoxia on ABG.  Her syncopal episodes may be related to hypoxia.  She needs to try to wear oxygen at all times.     Palliative care to follow the patient at the facility to assist with the goal of care         Consults  cardiology, nephrology  Significant Tests:  See full reports for all details     Ct Head Wo Contrast  Result Date: 06/28/2017 CLINICAL DATA:  Unwitnessed fall with right frontal trauma. Right facial bruising and neck pain. EXAM: CT HEAD WITHOUT CONTRAST CT MAXILLOFACIAL WITHOUT CONTRAST  CT CERVICAL SPINE WITHOUT CONTRAST TECHNIQUE: Multidetector CT imaging of the head, cervical spine, and maxillofacial structures were performed using the standard protocol without intravenous contrast. Multiplanar CT image reconstructions of the cervical spine and maxillofacial structures were also generated. COMPARISON:  06/20/2017 FINDINGS: CT HEAD FINDINGS Brain: No evidence of acute infarction, hemorrhage, hydrocephalus, extra-axial collection or mass lesion/mass effect. Atrophy and chronic small vessel disease. Vascular: Atherosclerotic calcification. Skull: Right scalp hematoma without underlying fracture. CT MAXILLOFACIAL FINDINGS Osseous: Negative for fracture or mandibular dislocation. Bilateral TMJ degenerative arthropathy Orbits: Bilateral cataract resection. No evidence of injury. Sinuses: No evidence of injury Soft tissues: Swelling about the lower left jaw. No opaque foreign body. CT CERVICAL SPINE FINDINGS Alignment: No traumatic malalignment. C4-5 anterolisthesis, stable and degenerative Skull base and vertebrae: Negative for acute fracture. Remote T2 superior endplate fracture. Soft tissues and spinal canal: No prevertebral fluid or swelling. No visible canal hematoma. Disc levels: Upper cervical predominant degenerative facet spurring. Lower cervical predominant disc degeneration. Upper chest: Right pleural effusion again noted. Other: Right clavicle fracture with ORIF earlier this year. There is bulky callus about the fracture. IMPRESSION: 1. No evidence of acute intracranial or cervical spine injury. 2. Right scalp hematoma without fracture. 3. Negative for facial fracture. Electronically Signed   By: Monte Fantasia M.D.   On: 06/28/2017 07:37   Ct Head Wo Contrast  Result Date: 06/20/2017 CLINICAL DATA:  Several recent falls EXAM: CT HEAD WITHOUT CONTRAST CT CERVICAL SPINE WITHOUT CONTRAST TECHNIQUE: Multidetector CT imaging of the head and cervical spine was performed following the  standard protocol without intravenous contrast. Multiplanar CT image reconstructions of the cervical spine were also generated. COMPARISON:  CT head  and CT cervical spine Jun 16, 2017 FINDINGS: CT HEAD FINDINGS Brain: Mild diffuse atrophy is stable. There is no intracranial mass, hemorrhage, extra-axial fluid collection, or midline shift. There is patchy small vessel disease throughout the centra semiovale bilaterally, stable. No evident acute infarct. Vascular: No hyperdense vessel. There is calcification in each distal vertebral artery and in each carotid siphon. Skull: Scalp hematomas along the right frontal and temporal regions are again noted. Scalp hematoma to the left of midline in the frontal region is smaller compared to recent study. Bony calvarium appears intact. Sinuses/Orbits: There is mucosal thickening in several ethmoid air cells. Other visualized paranasal sinuses are clear. Visualized orbits appear symmetric bilaterally. Other: Mastoid air cells are clear. CT CERVICAL SPINE FINDINGS Alignment: There is 1 mm of anterolisthesis of C3 on C4. There is 1 mm of anterolisthesis of C4 on C5. There is 1 mm of retrolisthesis of C5 on C6. There is 2 mm of retrolisthesis of C6 on C7. Skull base and vertebrae: The skull base and craniocervical junction regions appear normal. Bones are osteoporotic. No fracture is evident. No blastic or lytic bone lesions are evident. Soft tissues and spinal canal: Prevertebral soft tissues and predental space regions are normal. No paraspinous lesion. No evident cord or canal hematoma. Disc levels: There is moderately severe disc space narrowing at C5-6 and C6-7. There is slightly milder disc space narrowing at C4-5. There is facet osteoarthritic change at multiple levels bilaterally. There is exit foraminal narrowing on the left due to bony hypertrophy with impression on the exiting nerve root due to bony hypertrophy at this level. Similar changes are noted at C5-6 bilaterally  and at C6-7 bilaterally. No frank disc extrusion or high-grade stenosis evident. Upper chest: There is a sizable free-flowing pleural effusion on the right. Upper lung zones elsewhere clear. Other: There is calcification in each subclavian and carotid artery. There is a nodular lesion in the right lobe of the thyroid measuring 1.5 x 1.5 cm. IMPRESSION: CT head: Stable atrophy with supratentorial small vessel disease. No acute infarct. No mass or hemorrhage. Scalp hematomas are stable on the right and significantly smaller to the left of midline in the frontal region. No fracture evident. Foci of arterial vascular calcification noted. Mucosal thickening noted in several ethmoid air cells. CT cervical spine: No fracture. Areas of mild spondylolisthesis appear due to underlying spondylosis. There is multilevel arthropathy. Sizable free-flowing pleural effusion on the right. Foci of vascular calcification in subclavian and carotid arteries bilaterally. **An incidental finding of potential clinical significance has been found. 1.5 x 1.5 cm right thyroid nodular lesion. Consider further evaluation with thyroid ultrasound nonemergently. If patient is clinically hyperthyroid, consider nuclear medicine thyroid uptake and scan.** Electronically Signed   By: Lowella Grip III M.D.   On: 06/20/2017 19:57   Ct Head Wo Contrast  Result Date: 06/16/2017 CLINICAL DATA:  Fall with hematoma to forehead. EXAM: CT HEAD WITHOUT CONTRAST CT CERVICAL SPINE WITHOUT CONTRAST TECHNIQUE: Multidetector CT imaging of the head and cervical spine was performed following the standard protocol without intravenous contrast. Multiplanar CT image reconstructions of the cervical spine were also generated. COMPARISON:  CT of the head on 06/11/2017 and CT of the cervical spine on 04/21/2017. FINDINGS: CT HEAD FINDINGS Brain: Stable small vessel disease in the periventricular white matter. The brain demonstrates no evidence of acute hemorrhage,  infarction, edema, mass effect, extra-axial fluid collection, hydrocephalus or mass lesion. Vascular: No hyperdense vessel or unexpected calcification. Skull: Recent right parietal scalp  hematoma is smaller in size. There is a new right and midline frontal scalp hematoma present. No underlying acute skull fracture is identified. Sinuses/Orbits: No acute finding. Other: None. CT CERVICAL SPINE FINDINGS Alignment: Normal alignment without subluxation. Skull base and vertebrae: No evidence of cervical spine fracture. No bony lesions or destruction identified. Soft tissues and spinal canal: No prevertebral soft tissue swelling. Disc levels: Stable degenerative disc disease at C5-6 and C6-7. Mild degenerative disc disease at C4-5. Upper chest: Right pleural fluid seen at the apex. IMPRESSION: 1. New right frontal and midline scalp hematoma without skull fracture or acute intracranial findings. The recent right parietal scalp hematoma is smaller in size. 2. No evidence of acute cervical spine injury. Stable degenerative disc disease. 3. Right-sided pleural effusion seen in the upper right hemithorax. Electronically Signed   By: Aletta Edouard M.D.   On: 06/16/2017 18:32   Ct Head Wo Contrast  Result Date: 06/11/2017 CLINICAL DATA:  Unwitnessed fall. EXAM: CT HEAD WITHOUT CONTRAST TECHNIQUE: Contiguous axial images were obtained from the base of the skull through the vertex without intravenous contrast. COMPARISON:  April 21, 2017 FINDINGS: Brain: No subdural, epidural, or subarachnoid hemorrhage. Cerebellum, brainstem, and basal cisterns are normal. Ventricles and sulci are normal. White matter changes persist. No acute cortical ischemia or infarct. No mass effect or midline shift. Vascular: Calcified atherosclerosis is seen in the intracranial carotids. Skull: Normal. Negative for fracture or focal lesion. Sinuses/Orbits: No acute finding. Other: Hematoma over the right lateral scalp. Extracranial soft tissues are  otherwise normal. IMPRESSION: 1. Hematoma over the right lateral scalp. No underlying fracture identified. 2. No acute intracranial abnormality identified. Chronic white matter changes. Electronically Signed   By: Dorise Bullion III M.D   On: 06/11/2017 11:55   Ct Chest Wo Contrast  Result Date: 06/16/2017 CLINICAL DATA:  Status post multiple falls with right shoulder pain and scattered bruising. EXAM: CT CHEST WITHOUT CONTRAST TECHNIQUE: Multidetector CT imaging of the chest was performed following the standard protocol without IV contrast. COMPARISON:  Chest CT dated 01/01/2017 FINDINGS: Cardiovascular: Enlarged heart. Biventricular pacemaker in place. No evidence of pericardial effusion. Calcific atherosclerotic disease of the coronary arteries and aorta. Mediastinum/Nodes: No enlarged mediastinal or axillary lymph nodes. Thyroid gland, trachea, and esophagus demonstrate no significant findings. Lungs/Pleura: Large layering right pleural effusion with compressive atelectasis of the right lower lobe and near complete collapse of the right middle lobe. Upper Abdomen: Atrophic kidneys.  Vascular calcifications. Musculoskeletal: There has been a prior side plate and screw fixation through distal right clavicular fracture. The screws are anchored within the distal fracture fragment and posttraumatic callus. There is stable deformity with significant angulation at the proximal fracture site. There is longitudinal linear lucency through the proximal right clavicle with scant overlying callus formation, which likely represents a nonunion fracture. No evidence of spinal rib fractures. There is a healing or healed sternal body fracture. IMPRESSION: Large layering right pleural effusion with compressive atelectasis of the right lower lobe and near complete collapse of the right middle lobe. Enlarged heart. Calcific atherosclerotic disease of the coronary arteries and aorta. Sideplate and screw fixation of prior  comminuted right clavicular fracture with screws which are anchored to the distal fracture fragment and callus. Sharp angulation at the proximal fracture line, which appears stable from the radiograph dated 05/25/2017. Healing or healed sternal body fracture. No evidence of acute injury. Aortic Atherosclerosis (ICD10-I70.0). Electronically Signed   By: Fidela Salisbury M.D.   On: 06/16/2017 18:43  Ct Cervical Spine Wo Contrast  Result Date: 06/28/2017 CLINICAL DATA:  Unwitnessed fall with right frontal trauma. Right facial bruising and neck pain. EXAM: CT HEAD WITHOUT CONTRAST CT MAXILLOFACIAL WITHOUT CONTRAST CT CERVICAL SPINE WITHOUT CONTRAST TECHNIQUE: Multidetector CT imaging of the head, cervical spine, and maxillofacial structures were performed using the standard protocol without intravenous contrast. Multiplanar CT image reconstructions of the cervical spine and maxillofacial structures were also generated. COMPARISON:  06/20/2017 FINDINGS: CT HEAD FINDINGS Brain: No evidence of acute infarction, hemorrhage, hydrocephalus, extra-axial collection or mass lesion/mass effect. Atrophy and chronic small vessel disease. Vascular: Atherosclerotic calcification. Skull: Right scalp hematoma without underlying fracture. CT MAXILLOFACIAL FINDINGS Osseous: Negative for fracture or mandibular dislocation. Bilateral TMJ degenerative arthropathy Orbits: Bilateral cataract resection. No evidence of injury. Sinuses: No evidence of injury Soft tissues: Swelling about the lower left jaw. No opaque foreign body. CT CERVICAL SPINE FINDINGS Alignment: No traumatic malalignment. C4-5 anterolisthesis, stable and degenerative Skull base and vertebrae: Negative for acute fracture. Remote T2 superior endplate fracture. Soft tissues and spinal canal: No prevertebral fluid or swelling. No visible canal hematoma. Disc levels: Upper cervical predominant degenerative facet spurring. Lower cervical predominant disc degeneration.  Upper chest: Right pleural effusion again noted. Other: Right clavicle fracture with ORIF earlier this year. There is bulky callus about the fracture. IMPRESSION: 1. No evidence of acute intracranial or cervical spine injury. 2. Right scalp hematoma without fracture. 3. Negative for facial fracture. Electronically Signed   By: Monte Fantasia M.D.   On: 06/28/2017 07:37   Ct Cervical Spine Wo Contrast  Result Date: 06/20/2017 CLINICAL DATA:  Several recent falls EXAM: CT HEAD WITHOUT CONTRAST CT CERVICAL SPINE WITHOUT CONTRAST TECHNIQUE: Multidetector CT imaging of the head and cervical spine was performed following the standard protocol without intravenous contrast. Multiplanar CT image reconstructions of the cervical spine were also generated. COMPARISON:  CT head and CT cervical spine Jun 16, 2017 FINDINGS: CT HEAD FINDINGS Brain: Mild diffuse atrophy is stable. There is no intracranial mass, hemorrhage, extra-axial fluid collection, or midline shift. There is patchy small vessel disease throughout the centra semiovale bilaterally, stable. No evident acute infarct. Vascular: No hyperdense vessel. There is calcification in each distal vertebral artery and in each carotid siphon. Skull: Scalp hematomas along the right frontal and temporal regions are again noted. Scalp hematoma to the left of midline in the frontal region is smaller compared to recent study. Bony calvarium appears intact. Sinuses/Orbits: There is mucosal thickening in several ethmoid air cells. Other visualized paranasal sinuses are clear. Visualized orbits appear symmetric bilaterally. Other: Mastoid air cells are clear. CT CERVICAL SPINE FINDINGS Alignment: There is 1 mm of anterolisthesis of C3 on C4. There is 1 mm of anterolisthesis of C4 on C5. There is 1 mm of retrolisthesis of C5 on C6. There is 2 mm of retrolisthesis of C6 on C7. Skull base and vertebrae: The skull base and craniocervical junction regions appear normal. Bones are  osteoporotic. No fracture is evident. No blastic or lytic bone lesions are evident. Soft tissues and spinal canal: Prevertebral soft tissues and predental space regions are normal. No paraspinous lesion. No evident cord or canal hematoma. Disc levels: There is moderately severe disc space narrowing at C5-6 and C6-7. There is slightly milder disc space narrowing at C4-5. There is facet osteoarthritic change at multiple levels bilaterally. There is exit foraminal narrowing on the left due to bony hypertrophy with impression on the exiting nerve root due to bony hypertrophy at this level.  Similar changes are noted at C5-6 bilaterally and at C6-7 bilaterally. No frank disc extrusion or high-grade stenosis evident. Upper chest: There is a sizable free-flowing pleural effusion on the right. Upper lung zones elsewhere clear. Other: There is calcification in each subclavian and carotid artery. There is a nodular lesion in the right lobe of the thyroid measuring 1.5 x 1.5 cm. IMPRESSION: CT head: Stable atrophy with supratentorial small vessel disease. No acute infarct. No mass or hemorrhage. Scalp hematomas are stable on the right and significantly smaller to the left of midline in the frontal region. No fracture evident. Foci of arterial vascular calcification noted. Mucosal thickening noted in several ethmoid air cells. CT cervical spine: No fracture. Areas of mild spondylolisthesis appear due to underlying spondylosis. There is multilevel arthropathy. Sizable free-flowing pleural effusion on the right. Foci of vascular calcification in subclavian and carotid arteries bilaterally. **An incidental finding of potential clinical significance has been found. 1.5 x 1.5 cm right thyroid nodular lesion. Consider further evaluation with thyroid ultrasound nonemergently. If patient is clinically hyperthyroid, consider nuclear medicine thyroid uptake and scan.** Electronically Signed   By: Lowella Grip III M.D.   On:  06/20/2017 19:57   Ct Cervical Spine Wo Contrast  Result Date: 06/16/2017 CLINICAL DATA:  Fall with hematoma to forehead. EXAM: CT HEAD WITHOUT CONTRAST CT CERVICAL SPINE WITHOUT CONTRAST TECHNIQUE: Multidetector CT imaging of the head and cervical spine was performed following the standard protocol without intravenous contrast. Multiplanar CT image reconstructions of the cervical spine were also generated. COMPARISON:  CT of the head on 06/11/2017 and CT of the cervical spine on 04/21/2017. FINDINGS: CT HEAD FINDINGS Brain: Stable small vessel disease in the periventricular white matter. The brain demonstrates no evidence of acute hemorrhage, infarction, edema, mass effect, extra-axial fluid collection, hydrocephalus or mass lesion. Vascular: No hyperdense vessel or unexpected calcification. Skull: Recent right parietal scalp hematoma is smaller in size. There is a new right and midline frontal scalp hematoma present. No underlying acute skull fracture is identified. Sinuses/Orbits: No acute finding. Other: None. CT CERVICAL SPINE FINDINGS Alignment: Normal alignment without subluxation. Skull base and vertebrae: No evidence of cervical spine fracture. No bony lesions or destruction identified. Soft tissues and spinal canal: No prevertebral soft tissue swelling. Disc levels: Stable degenerative disc disease at C5-6 and C6-7. Mild degenerative disc disease at C4-5. Upper chest: Right pleural fluid seen at the apex. IMPRESSION: 1. New right frontal and midline scalp hematoma without skull fracture or acute intracranial findings. The recent right parietal scalp hematoma is smaller in size. 2. No evidence of acute cervical spine injury. Stable degenerative disc disease. 3. Right-sided pleural effusion seen in the upper right hemithorax. Electronically Signed   By: Aletta Edouard M.D.   On: 06/16/2017 18:32   Ct T-spine No Charge  Result Date: 06/16/2017 CLINICAL DATA:  Fall today. EXAM: CT THORACIC SPINE  WITHOUT CONTRAST TECHNIQUE: Multidetector CT images of the thoracic were obtained using the standard protocol without intravenous contrast. COMPARISON:  None. FINDINGS: Alignment: The thoracic spine shows normal alignment. Vertebrae: No evidence of fracture.  No bony lesions. Paraspinal and other soft tissues: No paravertebral soft tissue abnormalities. Disc levels: Minimal degenerative disc disease and osteophyte formation. Moderate to large right pleural effusion present. IMPRESSION: No evidence of acute thoracic spine injury. Moderate to large right pleural effusion. Electronically Signed   By: Aletta Edouard M.D.   On: 06/16/2017 18:36   Dg Chest Portable 1 View  Result Date: 06/28/2017 CLINICAL DATA:  84 year old female post fall.  Initial encounter. EXAM: PORTABLE CHEST 1 VIEW COMPARISON:  05/25/2017 chest x-ray.  06/16/2017 chest CT. FINDINGS: Slight increase in size of moderately large right-sided pleural effusion. Persistent consolidation right middle lobe and right lung base. Cardiomegaly with biventricular pacer in place. Right central line tip caval atrial junction. No pneumothorax. Calcified tortuous aorta. Remote right clavicle fracture with angulation of fracture fragments and prominent callus formation. Clavicle plate and screws in place. No new fracture noted. IMPRESSION: Slight increase in size of moderately large right-sided pleural effusion. Persistent consolidation right middle lobe and right lung base. Cardiomegaly with biventricular pacer in place. Right central line tip caval atrial junction. Aortic Atherosclerosis (ICD10-I70.0). Remote right clavicle fracture with angulation of fracture fragments and prominent callus formation. Clavicle plate and screws in place. Electronically Signed   By: Genia Del M.D.   On: 06/28/2017 08:43   Ct Maxillofacial Wo Contrast  Result Date: 06/28/2017 CLINICAL DATA:  Unwitnessed fall with right frontal trauma. Right facial bruising and neck pain.  EXAM: CT HEAD WITHOUT CONTRAST CT MAXILLOFACIAL WITHOUT CONTRAST CT CERVICAL SPINE WITHOUT CONTRAST TECHNIQUE: Multidetector CT imaging of the head, cervical spine, and maxillofacial structures were performed using the standard protocol without intravenous contrast. Multiplanar CT image reconstructions of the cervical spine and maxillofacial structures were also generated. COMPARISON:  06/20/2017 FINDINGS: CT HEAD FINDINGS Brain: No evidence of acute infarction, hemorrhage, hydrocephalus, extra-axial collection or mass lesion/mass effect. Atrophy and chronic small vessel disease. Vascular: Atherosclerotic calcification. Skull: Right scalp hematoma without underlying fracture. CT MAXILLOFACIAL FINDINGS Osseous: Negative for fracture or mandibular dislocation. Bilateral TMJ degenerative arthropathy Orbits: Bilateral cataract resection. No evidence of injury. Sinuses: No evidence of injury Soft tissues: Swelling about the lower left jaw. No opaque foreign body. CT CERVICAL SPINE FINDINGS Alignment: No traumatic malalignment. C4-5 anterolisthesis, stable and degenerative Skull base and vertebrae: Negative for acute fracture. Remote T2 superior endplate fracture. Soft tissues and spinal canal: No prevertebral fluid or swelling. No visible canal hematoma. Disc levels: Upper cervical predominant degenerative facet spurring. Lower cervical predominant disc degeneration. Upper chest: Right pleural effusion again noted. Other: Right clavicle fracture with ORIF earlier this year. There is bulky callus about the fracture. IMPRESSION: 1. No evidence of acute intracranial or cervical spine injury. 2. Right scalp hematoma without fracture. 3. Negative for facial fracture. Electronically Signed   By: Monte Fantasia M.D.   On: 06/28/2017 07:37       Today   Subjective:   Sharon [name] doing much better awake sitting on the side of the bed Objective:   Blood pressure 117/61, pulse 63, temperature 98 F (36.7 C),  temperature source Oral, resp. rate 16, height 5\' 4"  (1.626 m), weight 39.6 kg (87 lb 4.8 oz), SpO2 92 %.  .  Intake/Output Summary (Last 24 hours) at 07/01/2017 0958 Last data filed at 07/01/2017 0725 Gross per 24 hour  Intake -  Output 0 ml  Net 0 ml    Exam VITAL SIGNS: Blood pressure 117/61, pulse 63, temperature 98 F (36.7 C), temperature source Oral, resp. rate 16, height 5\' 4"  (1.626 m), weight 39.6 kg (87 lb 4.8 oz), SpO2 92 %.  GENERAL:  84 y.o.-year-old patient lying in the bed with no acute distress.  EYES: Pupils equal, round, reactive to light and accommodation. No scleral icterus. Extraocular muscles intact.  HEENT: Head atraumatic, normocephalic. Oropharynx and nasopharynx clear.  NECK:  Supple, no jugular venous distention. No thyroid enlargement, no tenderness.  LUNGS: Normal breath sounds  bilaterally, no wheezing, rales,rhonchi or crepitation. No use of accessory muscles of respiration.  CARDIOVASCULAR: S1, S2 normal. No murmurs, rubs, or gallops.  ABDOMEN: Soft, nontender, nondistended. Bowel sounds present. No organomegaly or mass.  EXTREMITIES: No pedal edema, cyanosis, or clubbing.  NEUROLOGIC: Cranial nerves II through XII are intact. Muscle strength 5/5 in all extremities. Sensation intact. Gait not checked.  PSYCHIATRIC: The patient is alert and oriented x 3.  SKIN: No obvious rash, lesion, or ulcer.   Data Review     CBC w Diff:  Lab Results  Component Value Date   WBC 7.2 06/30/2017   HGB 10.8 (L) 06/30/2017   HGB 12.0 04/24/2013   HCT 33.4 (L) 06/30/2017   HCT 35.6 04/24/2013   PLT 254 06/30/2017   PLT 216 04/24/2013   LYMPHOPCT 6 06/28/2017   LYMPHOPCT 25.3 04/24/2013   MONOPCT 8 06/28/2017   MONOPCT 8.9 04/24/2013   EOSPCT 0 06/28/2017   EOSPCT 3.1 04/24/2013   BASOPCT 0 06/28/2017   BASOPCT 0.5 04/24/2013   CMP:  Lab Results  Component Value Date   NA 138 06/30/2017   NA 142 04/24/2013   K 3.6 06/30/2017   K 3.8 04/24/2013   CL 97  (L) 06/30/2017   CL 106 04/24/2013   CO2 27 06/30/2017   CO2 32 04/24/2013   BUN 56 (H) 06/30/2017   BUN 40 (A) 09/03/2014   BUN 30 (H) 04/24/2013   CREATININE 4.35 (H) 06/30/2017   CREATININE 2.68 (H) 04/24/2013   PROT 7.6 06/28/2017   ALBUMIN 3.2 (L) 06/30/2017   BILITOT 3.6 (H) 06/28/2017   ALKPHOS 179 (H) 06/28/2017   AST 130 (H) 06/28/2017   ALT 69 (H) 06/28/2017  .  Micro Results Recent Results (from the past 240 hour(s))  MRSA PCR Screening     Status: None   Collection Time: 06/28/17  1:58 PM  Result Value Ref Range Status   MRSA by PCR NEGATIVE NEGATIVE Final    Comment:        The GeneXpert MRSA Assay (FDA approved for NASAL specimens only), is one component of a comprehensive MRSA colonization surveillance program. It is not intended to diagnose MRSA infection nor to guide or monitor treatment for MRSA infections. Performed at [facility], [address] [place], [place] [address]         Code Status Orders  (From admission, onward)        Start     Ordered   06/29/17 0905  Do not attempt resuscitation (DNR)  Continuous    Question Answer Comment  In the event of cardiac or respiratory ARREST Do not call a "code blue"   In the event of cardiac or respiratory ARREST Do not perform Intubation, CPR, defibrillation or ACLS   In the event of cardiac or respiratory ARREST Use medication by any route, position, wound care, and other measures to relive pain and suffering. May use oxygen, suction and manual treatment of airway obstruction as needed for comfort.      06/29/17 0904    Code Status History    Date Active Date Inactive Code Status Order ID Comments User Context   06/28/2017 1239 06/29/2017 0904 DNR [id number]  Henreitta Leber, MD Inpatient   05/24/2017 2258 06/01/2017 1706 DNR [id number]  Vaughan Basta, MD Inpatient   02/16/2017 1928 02/17/2017 1949 Full Code [id number]  Poggi, Marshall Cork, MD Inpatient   02/03/2017 1230 02/04/2017 1907 DNR  [id number]  Nicholes Mango, MD ED  01/01/2017 2027 01/08/2017 1858 Full Code [id number]  [name], MD ED   05/12/2016 0809 05/14/2016 1439 DNR [id number]  Max Sane, MD Inpatient   05/10/2016 0506 05/12/2016 0809 Full Code [id number]  Saundra Shelling, MD Inpatient   12/15/2015 1515 12/19/2015 0159 Full Code [id number]  Oletta Cohn, DO Inpatient   12/14/2015 1613 12/15/2015 1515 Full Code [id number]  Bettey Costa, MD Inpatient    Advance Directive Documentation     Most Recent Value  Type of Advance Directive  Out of facility DNR (pink MOST or yellow form)  Pre-existing out of facility DNR order (yellow form or pink MOST form)  Physician notified to receive inpatient order  "MOST" Form in Place?  -          Follow-up Information    Kirk Ruths, MD Follow up in 1 week(s).   Specialty:  Internal Medicine Why:  hosp f/u Contact information: [address] [place] [address] [phone]           Discharge Medications   Allergies as of 07/01/2017      Reactions   Statins Other (See Comments)   Muscle weakness severe   Codeine Sulfate Nausea And Vomiting   Codeine Other (See Comments)   GI UPSET   Effexor [venlafaxine] Nausea Only   Ezetimibe-simvastatin Other (See Comments)   Muscle weakness      Medication List    TAKE these medications   acetaminophen 325 MG tablet Commonly known as:  TYLENOL Take 2 tablets (650 mg total) by mouth every 4 (four) hours as needed for mild pain or moderate pain.   benzonatate 200 MG capsule Commonly known as:  TESSALON Take 1 capsule (200 mg total) by mouth 3 (three) times daily.   guaiFENesin-dextromethorphan 100-10 MG/5ML syrup Commonly known as:  ROBITUSSIN DM Take 5 mLs by mouth every 4 (four) hours as needed for cough.   hydrocortisone 2.5 % cream Apply topically 3 (three) times daily.   latanoprost 0.005 % ophthalmic solution Commonly known as:  XALATAN Place 1 drop into both  eyes at bedtime.   levofloxacin 500 MG tablet Commonly known as:  LEVAQUIN Take 1 tablet (500 mg total) by mouth daily for 5 days. Start taking on:  07/02/2017   levothyroxine 25 MCG tablet Commonly known as:  SYNTHROID, LEVOTHROID Take 1 tablet (25 mcg total) by mouth daily.   metoCLOPramide 5 MG tablet Commonly known as:  REGLAN Take 5 mg by mouth every 8 (eight) hours as needed (for vertigo).   metoprolol succinate 25 MG 24 hr tablet Commonly known as:  TOPROL-XL Take 1 tablet (25 mg total) by mouth daily.   multivitamin Tabs tablet Take 1 tablet by mouth daily.   sertraline 100 MG tablet Commonly known as:  ZOLOFT Take 50 mg by mouth at bedtime.   sevelamer carbonate 800 MG tablet Commonly known as:  RENVELA Take 1 tablet (800 mg total) by mouth 3 (three) times daily with meals.   traMADol 50 MG tablet Commonly known as:  ULTRAM Take 1-2 tablets (50-100 mg total) by mouth every 6 (six) hours as needed for moderate pain. What changed:    how much to take  reasons to take this   Vitamin D (Ergocalciferol) 50000 units Caps capsule Commonly known as:  DRISDOL Take 50,000 Units by mouth every Monday.            Durable Medical Equipment  (From admission, onward)  Start     Ordered   07/01/17 0950  DME Oxygen  Once    Question Answer Comment  Mode or (Route) Nasal cannula   Liters per Minute 2   Frequency Continuous (stationary and portable oxygen unit needed)   Oxygen delivery system Gas      07/01/17 0953         Total Time in preparing paper work, data evaluation and todays exam - 59 minutes  Dustin Flock M.D on 07/01/2017 at 9:58 AM [place]  [phone]

## 2017-07-01 NOTE — Progress Notes (Signed)
New referral for outpatient Palliative to follow at [facility] received from [employer]. Patient information faxed to referral. Patient to discharge today. Flo Shanks Rn, BSN, [facility] [place], hospital liaison [phone]

## 2017-07-01 NOTE — Plan of Care (Signed)
  Problem: Education: Goal: Knowledge of General Education information will improve Outcome: Progressing   Problem: Clinical Measurements: Goal: Will remain free from infection Outcome: Progressing   Problem: Safety: Goal: Ability to remain free from injury will improve Outcome: Progressing   

## 2017-07-01 NOTE — Progress Notes (Signed)
[facility]  ROUNDING NOTE   Subjective:   Friend at bedside.  Hemodialysis treatment yesterday. Tolerated treatment well. UF even.   Objective:  Vital signs in last 24 hours:  Temp:  [97.6 F (36.4 C)-98 F (36.7 C)] 98 F (36.7 C) (06/06 0820) Pulse Rate:  [59-63] 63 (06/06 0831) Resp:  [13-18] 16 (06/06 0820) BP: (76-120)/(39-65) 117/61 (06/06 0820) SpO2:  [92 %-100 %] 92 % (06/06 0831) Weight:  [39.6 kg (87 lb 4.8 oz)] 39.6 kg (87 lb 4.8 oz) (06/06 0323)  Weight change:  Filed Weights   06/28/17 2240 06/29/17 0345 07/01/17 0323  Weight: 46.2 kg (101 lb 13.6 oz) 36.6 kg (80 lb 11.2 oz) 39.6 kg (87 lb 4.8 oz)    Intake/Output: I/O last 3 completed shifts: In: 480 [P.O.:480] Out: 100 [Urine:100]   Intake/Output this shift:  Total I/O In: 120 [P.O.:120] Out: 0   Physical Exam: General: NAD, laying in bed  Head: Right frontal edema and ecchymosis.   Eyes: Anicteric, PERRL  Neck: Supple, trachea midline  Lungs:  Clear to auscultation  Heart: Regular rate and rhythm  Abdomen:  Soft, nontender  Extremities: no peripheral edema.  Neurologic: No alert or oriented  Skin: No lesions  Access: LIJ permcath. Left AVF    Basic Metabolic Panel: Recent Labs  Lab 06/28/17 0906 06/29/17 0719 06/30/17 0945  NA 140 138 138  K 5.6* 3.7 3.6  CL 99* 97* 97*  CO2 20* 27 27  GLUCOSE 110* 71 109*  BUN 73* 35* 56*  CREATININE 5.60* 3.01* 4.35*  CALCIUM 9.2 8.3* 8.0*  PHOS  --   --  6.0*    Liver Function Tests: Recent Labs  Lab 06/28/17 0906 06/30/17 0945  AST 130*  --   ALT 69*  --   ALKPHOS 179*  --   BILITOT 3.6*  --   PROT 7.6  --   ALBUMIN 3.7 3.2*   No results for input(s): LIPASE, AMYLASE in the last 168 hours. No results for input(s): AMMONIA in the last 168 hours.  CBC: Recent Labs  Lab 06/28/17 0906 06/28/17 1652 06/29/17 0719 06/30/17 0945  WBC 9.9 8.6 8.3 7.2  NEUTROABS 8.4*  --   --   --   HGB 11.3* 11.5* 10.7* 10.8*  HCT  34.1* 35.0 33.0* 33.4*  MCV 94.3 94.9 94.7 95.2  PLT 263 246 245 254    Cardiac Enzymes: Recent Labs  Lab 06/28/17 0906 06/28/17 1259 06/28/17 1652 06/28/17 2023  TROPONINI 2.99* 2.64* 3.00* 2.96*    BNP: Invalid input(s): POCBNP  CBG: No results for input(s): GLUCAP in the last 168 hours.  Microbiology: Results for orders placed or performed during the hospital encounter of 06/28/17  MRSA PCR Screening     Status: None   Collection Time: 06/28/17  1:58 PM  Result Value Ref Range Status   MRSA by PCR NEGATIVE NEGATIVE Final    Comment:        The GeneXpert MRSA Assay (FDA approved for NASAL specimens only), is one component of a comprehensive MRSA colonization surveillance program. It is not intended to diagnose MRSA infection nor to guide or monitor treatment for MRSA infections. Performed at [facility], [address] [place], [place] [address]     Coagulation Studies: No results for input(s): LABPROT, INR in the last 72 hours.  Urinalysis: No results for input(s): COLORURINE, LABSPEC, PHURINE, GLUCOSEU, HGBUR, BILIRUBINUR, KETONESUR, PROTEINUR, UROBILINOGEN, NITRITE, LEUKOCYTESUR in the last 72 hours.  Invalid  input(s): APPERANCEUR    Imaging: No results found.   Medications:       Assessment/ Plan:  [name] is a 84 y.o. white female with end stage renal disease on hemodialysis, coronary artery disease, pacemaker placement, meniere's disease, congestive heart failure, peripheral vascular disease, multiple falls and fractures.   MWF [facility] Sharon [name] permcath/left AVF EDW 42.5kg.   1.  End-stage renal disease: tolerated treatment well yesterday. Using LIJ permcath  2.  Anemia of chronic kidney disease: hemoglobin 10.8 - epo as outpatient.   3.  Secondary hyperparathyroidism: outpatient labs: 5/13 PTH 640, phos 5, calcium 8.3 - sevelamer with meals  4.  Hypertension: blood pressure is low during dialysis  treatment.  - metoprolol  5. Nutrition: Vitamin C deficiency - IV vitamin C. Will need PO supplement on discharge.    LOS: 3 Sharon [name] 6/6/201910:39 AM

## 2017-07-01 NOTE — Care Management Important Message (Signed)
Patient wanted me to call brother to go over IM.  Called and spoke with Santina Evans.  He is aware of right and is in agreement with discharge plan.  Gave verbal consent.  Signed IM sent to medical records to be scanned into chart.

## 2017-08-11 ENCOUNTER — Encounter (INDEPENDENT_AMBULATORY_CARE_PROVIDER_SITE_OTHER): Payer: Self-pay

## 2017-08-12 ENCOUNTER — Encounter (INDEPENDENT_AMBULATORY_CARE_PROVIDER_SITE_OTHER): Payer: Medicare Other

## 2017-08-16 ENCOUNTER — Other Ambulatory Visit (INDEPENDENT_AMBULATORY_CARE_PROVIDER_SITE_OTHER): Payer: Self-pay | Admitting: Vascular Surgery

## 2017-08-24 ENCOUNTER — Ambulatory Visit
Admission: RE | Admit: 2017-08-24 | Discharge: 2017-08-24 | Disposition: A | Discharge status: Home / Self Care | Payer: Medicare Other | Source: Ambulatory Visit | Attending: Vascular Surgery | Admitting: Vascular Surgery

## 2017-08-24 ENCOUNTER — Encounter: Payer: Self-pay | Admitting: *Deleted

## 2017-08-24 ENCOUNTER — Encounter
Admission: RE | Disposition: A | Discharge status: Home / Self Care | Payer: Self-pay | Source: Ambulatory Visit | Attending: Vascular Surgery

## 2017-08-24 DIAGNOSIS — N186 End stage renal disease: Secondary | ICD-10-CM

## 2017-08-24 DIAGNOSIS — Z9841 Cataract extraction status, right eye: Secondary | ICD-10-CM | POA: Insufficient documentation

## 2017-08-24 DIAGNOSIS — T82868A Thrombosis of vascular prosthetic devices, implants and grafts, initial encounter: Secondary | ICD-10-CM

## 2017-08-24 DIAGNOSIS — G4733 Obstructive sleep apnea (adult) (pediatric): Secondary | ICD-10-CM | POA: Diagnosis not present

## 2017-08-24 DIAGNOSIS — Z885 Allergy status to narcotic agent status: Secondary | ICD-10-CM | POA: Diagnosis not present

## 2017-08-24 DIAGNOSIS — H8109 Meniere's disease, unspecified ear: Secondary | ICD-10-CM | POA: Insufficient documentation

## 2017-08-24 DIAGNOSIS — Z87891 Personal history of nicotine dependence: Secondary | ICD-10-CM | POA: Diagnosis not present

## 2017-08-24 DIAGNOSIS — Z9842 Cataract extraction status, left eye: Secondary | ICD-10-CM | POA: Insufficient documentation

## 2017-08-24 DIAGNOSIS — I10 Essential (primary) hypertension: Secondary | ICD-10-CM | POA: Diagnosis not present

## 2017-08-24 DIAGNOSIS — Z96652 Presence of left artificial knee joint: Secondary | ICD-10-CM | POA: Diagnosis not present

## 2017-08-24 DIAGNOSIS — Z8041 Family history of malignant neoplasm of ovary: Secondary | ICD-10-CM | POA: Insufficient documentation

## 2017-08-24 DIAGNOSIS — Z808 Family history of malignant neoplasm of other organs or systems: Secondary | ICD-10-CM | POA: Insufficient documentation

## 2017-08-24 DIAGNOSIS — Z859 Personal history of malignant neoplasm, unspecified: Secondary | ICD-10-CM | POA: Insufficient documentation

## 2017-08-24 DIAGNOSIS — I509 Heart failure, unspecified: Secondary | ICD-10-CM | POA: Diagnosis not present

## 2017-08-24 DIAGNOSIS — Z950 Presence of cardiac pacemaker: Secondary | ICD-10-CM | POA: Insufficient documentation

## 2017-08-24 DIAGNOSIS — K589 Irritable bowel syndrome without diarrhea: Secondary | ICD-10-CM | POA: Insufficient documentation

## 2017-08-24 DIAGNOSIS — I2510 Atherosclerotic heart disease of native coronary artery without angina pectoris: Secondary | ICD-10-CM | POA: Diagnosis not present

## 2017-08-24 DIAGNOSIS — M1990 Unspecified osteoarthritis, unspecified site: Secondary | ICD-10-CM | POA: Insufficient documentation

## 2017-08-24 DIAGNOSIS — I429 Cardiomyopathy, unspecified: Secondary | ICD-10-CM | POA: Diagnosis not present

## 2017-08-24 DIAGNOSIS — Z452 Encounter for adjustment and management of vascular access device: Secondary | ICD-10-CM | POA: Insufficient documentation

## 2017-08-24 DIAGNOSIS — Z992 Dependence on renal dialysis: Secondary | ICD-10-CM | POA: Insufficient documentation

## 2017-08-24 DIAGNOSIS — Z90710 Acquired absence of both cervix and uterus: Secondary | ICD-10-CM | POA: Insufficient documentation

## 2017-08-24 DIAGNOSIS — N179 Acute kidney failure, unspecified: Secondary | ICD-10-CM | POA: Diagnosis not present

## 2017-08-24 DIAGNOSIS — Z833 Family history of diabetes mellitus: Secondary | ICD-10-CM | POA: Insufficient documentation

## 2017-08-24 DIAGNOSIS — E119 Type 2 diabetes mellitus without complications: Secondary | ICD-10-CM | POA: Diagnosis not present

## 2017-08-24 DIAGNOSIS — Z9049 Acquired absence of other specified parts of digestive tract: Secondary | ICD-10-CM | POA: Insufficient documentation

## 2017-08-24 DIAGNOSIS — Z9889 Other specified postprocedural states: Secondary | ICD-10-CM | POA: Insufficient documentation

## 2017-08-24 DIAGNOSIS — I252 Old myocardial infarction: Secondary | ICD-10-CM | POA: Diagnosis not present

## 2017-08-24 DIAGNOSIS — Z801 Family history of malignant neoplasm of trachea, bronchus and lung: Secondary | ICD-10-CM | POA: Insufficient documentation

## 2017-08-24 DIAGNOSIS — Z888 Allergy status to other drugs, medicaments and biological substances status: Secondary | ICD-10-CM | POA: Insufficient documentation

## 2017-08-24 DIAGNOSIS — I132 Hypertensive heart and chronic kidney disease with heart failure and with stage 5 chronic kidney disease, or end stage renal disease: Secondary | ICD-10-CM | POA: Diagnosis not present

## 2017-08-24 HISTORY — DX: Anxiety disorder, unspecified: F41.9

## 2017-08-24 HISTORY — PX: DIALYSIS/PERMA CATHETER REMOVAL: CATH118289

## 2017-08-24 SURGERY — DIALYSIS/PERMA CATHETER REMOVAL
Anesthesia: LOCAL

## 2017-08-24 MED ORDER — LIDOCAINE-EPINEPHRINE (PF) 1 %-1:200000 IJ SOLN
INTRAMUSCULAR | Status: DC | PRN
  Administered 2017-08-24: 10 mL

## 2017-08-24 SURGICAL SUPPLY — 2 items
FORCEPS HALSTEAD CVD 5IN STRL (INSTRUMENTS) ×2 IMPLANT
TRAY LACERAT/PLASTIC (MISCELLANEOUS) ×2 IMPLANT

## 2017-08-24 NOTE — Op Note (Signed)
  OPERATIVE NOTE   PROCEDURE: 1. Removal of a left IJ tunneled dialysis catheter  PRE-OPERATIVE DIAGNOSIS: Complication of dialysis catheter, End stage renal disease  POST-OPERATIVE DIAGNOSIS: Same  SURGEON: Hortencia Pilar, M.D.  ANESTHESIA: Local anesthetic with 1% lidocaine with epinephrine   ESTIMATED BLOOD LOSS: Minimal   FINDING(S): 1. Catheter intact   SPECIMEN(S):  Catheter  INDICATIONS:   Sharon [name] is a 84 y.o. female who presents with functioning right wrist fistula.  The patient has undergone placement of an extremity access which is working and this has been successfully cannulated without difficulty.  therefore is undergoing removal of his tunneled catheter which is no longer needed to avoid septic complications.   DESCRIPTION: After obtaining full informed written consent, the patient was positioned supine. The left IJ tunneled catheter and surrounding area is prepped and draped in a sterile fashion. The cuff was localized by palpation and noted to be less than 3 cm from the exit site. After appropriate timeout is called, 1% lidocaine with epinephrine is infiltrated into the surrounding tissues around the cuff. Small transverse incision is created at the exit site with an 11 blade scalpel and the dissection was carried up along the catheter to expose the cuff of the tunneled catheter.  The catheter cuff is then freed from the surrounding attachments and adhesions. Once the catheter has been freed circumferentially it is removed in 1 piece. Light pressure was held at the base of the neck.   Antibiotic ointment and a sterile dressing is applied to the exit site. Patient tolerated procedure well and there were no complications.  COMPLICATIONS: None  CONDITION: Unchanged  Hortencia Pilar, M.D. [facility] Office: [phone]  08/24/2017,3:58 PM

## 2017-08-24 NOTE — Progress Notes (Addendum)
Patient and her brother who is also POA voiced concerns over multiple medical issues the patient is experiencing.  Lower extremity ulcerations, purple feet, periorbital edema, and sob.  They are both requesting referrals to specialists for treament of these issues.  The brother Nelle Don asked could they be taken care of today.  I told him I would call dr. Sharyon Cable office to relay the concerns and let them contact him regarding any referrals for the patient. I explained these concerns to dr. Sharyon Cable office and left a message for the nurse to call patient's brother.  However, the patient did not leave his cell phone  Number and his home number is not set up with voicemail.  His sister at bedside stated she refused to give his cell number to [facility].

## 2017-08-24 NOTE — H&P (Signed)
[facility] Admission History & Physical  MRN : [id number]  Sharon [name] is a 84 y.o. (09/24/1926) female who presents with chief complaint of here to get my catheter out.  History of Present Illness: I am asked to evaluate the patient by the dialysis center. The patient was sent here because they have a nonfunctioning tunneled catheter and a functioning right radiocephalic AV fistula.  The patient reports they're not been any problems with any of their dialysis runs. They are reporting good flows with good parameters at dialysis.  Patient denies pain or tenderness overlying the access.  There is no pain with dialysis.  The patient denies hand pain or finger pain consistent with steal syndrome.  No fevers or chills while on dialysis.   No current facility-administered medications for this encounter.    Facility-Administered Medications Ordered in Other Encounters  Medication Dose Route Frequency Provider Last Rate Last Dose  . ceFAZolin (ANCEF) IVPB 1 g/50 mL premix  1 g Intravenous Once Stegmayer, Janalyn Harder, PA-C        Past Medical History:  Diagnosis Date  . Anemia   . Anxiety   . Arthritis   . CAD (coronary artery disease)   . Cancer ([place])    skin  . Cardiomyopathy ([place])   . CHF (congestive heart failure) ([place])   . Depression   . IBS (irritable bowel syndrome) 2010  . Kidney failure July 2012   Hemodialysis 3xweek  . Kidney failure   . Meniere disease   . Meniere's disease   . Myocardial infarction ([place])   . OSA (obstructive sleep apnea)    CPAP  . Peritoneal dialysis status ([place])   . Presence of IVC filter 2019  . Presence of permanent cardiac pacemaker   . Renal insufficiency     Past Surgical History:  Procedure Laterality Date  . [place]  . ABDOMINAL HYSTERECTOMY    . ANUS SURGERY  2010  . AV FISTULA PLACEMENT Right 04/15/2016   Procedure: ARTERIOVENOUS (AV) FISTULA CREATION ( RADIOCEPHALIC ) STAGE 2;   Surgeon: Algernon Huxley, MD;  Location: [facility];  Service: Vascular;  Laterality: Right;  . CATARACT EXTRACTION  2006, 2011  . CHOLECYSTECTOMY  01/2008  . DIALYSIS/PERMA CATHETER INSERTION N/A 01/07/2017   Procedure: DIALYSIS/PERMA CATHETER INSERTION With an IVC filter placement;  Surgeon: Algernon Huxley, MD;  Location: [facility];  Service: Cardiovascular;  Laterality: N/A;  . DIALYSIS/PERMA CATHETER REMOVAL N/A 08/20/2016   Procedure: Dialysis/Perma Catheter Removal;  Surgeon: Algernon Huxley, MD;  Location: [facility];  Service: Cardiovascular;  Laterality: N/A;  . EYE SURGERY     cataracts bilateral  . FEMUR IM NAIL Left 12/15/2015   Procedure: INTRAMEDULLARY (IM) RETROGRADE FEMORAL NAILING;  Surgeon: Oletta Cohn, DO;  Location: [facility];  Service: Orthopedics;  Laterality: Left;  . HIP PINNING,CANNULATED Left 05/10/2016   Procedure: CANNULATED HIP PINNING;  Surgeon: Earnestine Leys, MD;  Location: [facility];  Service: Orthopedics;  Laterality: Left;  . INSERT / REPLACE / REMOVE PACEMAKER    . INSERTION OF DIALYSIS CATHETER  07/2010  . IVC FILTER INSERTION  2019  . JOINT REPLACEMENT     left knee  . MINOR REMOVAL OF PERITONEAL DIALYSIS CATHETER  04/15/2016   Procedure: MINOR REMOVAL OF PERITONEAL DIALYSIS CATHETER;  Surgeon: Algernon Huxley, MD;  Location: [facility];  Service: Vascular;;  . ORIF CLAVICULAR FRACTURE Right 02/16/2017   Procedure: OPEN REDUCTION  INTERNAL FIXATION (ORIF) CLAVICULAR FRACTURE;  Surgeon: Corky Mull, MD;  Location: [facility];  Service: Orthopedics;  Laterality: Right;  . PACEMAKER INSERTION  2006  . PERIPHERAL VASCULAR CATHETERIZATION N/A 12/18/2015   Procedure: Dialysis/Perma Catheter Insertion;  Surgeon: Katha Cabal, MD;  Location: [facility];  Service: Cardiovascular;  Laterality: N/A;  . TOTAL KNEE ARTHROPLASTY  2008   LEFT/Dr [place]    Social History Social History   Tobacco Use  . Smoking status: Former Smoker    Last attempt to  quit: 03/31/1948    Years since quitting: 69.4  . Smokeless tobacco: Never Used  Substance Use Topics  . Alcohol use: No  . Drug use: No    Family History Family History  Problem Relation Age of Onset  . Cancer Mother        ? ovarian - sarcoma  . Cancer Father        Skin cancer  . Diabetes Sister   . COPD Sister   . Depression Sister   . Cancer Sister        Lung - 80 yrs old    No family history of bleeding or clotting disorders, autoimmune disease or porphyria  Allergies  Allergen Reactions  . Statins Other (See Comments)    Muscle weakness severe  . Codeine Sulfate Nausea And Vomiting  . Codeine Other (See Comments)    GI UPSET  . Effexor [Venlafaxine] Nausea Only  . Ezetimibe-Simvastatin Other (See Comments)    Muscle weakness     REVIEW OF SYSTEMS (Negative unless checked)  Constitutional: [] Weight loss  [] Fever  [] Chills Cardiac: [] Chest pain   [] Chest pressure   [] Palpitations   [] Shortness of breath when laying flat   [] Shortness of breath at rest   [x] Shortness of breath with exertion. Vascular:  [] Pain in legs with walking   [] Pain in legs at rest   [] Pain in legs when laying flat   [] Claudication   [] Pain in feet when walking  [] Pain in feet at rest  [] Pain in feet when laying flat   [] History of DVT   [] Phlebitis   [] Swelling in legs   [] Varicose veins   [] Non-healing ulcers Pulmonary:   [] Uses home oxygen   [] Productive cough   [] Hemoptysis   [] Wheeze  [] COPD   [] Asthma Neurologic:  [] Dizziness  [] Blackouts   [] Seizures   [] History of stroke   [] History of TIA  [] Aphasia   [] Temporary blindness   [] Dysphagia   [] Weakness or numbness in arms   [] Weakness or numbness in legs Musculoskeletal:  [] Arthritis   [] Joint swelling   [] Joint pain   [] Low back pain Hematologic:  [] Easy bruising  [] Easy bleeding   [] Hypercoagulable state   [] Anemic  [] Hepatitis Gastrointestinal:  [] Blood in stool   [] Vomiting blood  [] Gastroesophageal reflux/heartburn   [] Difficulty  swallowing. Genitourinary:  [x] Chronic kidney disease   [] Difficult urination  [] Frequent urination  [] Burning with urination   [] Blood in urine Skin:  [] Rashes   [] Ulcers   [] Wounds Psychological:  [] History of anxiety   []  History of major depression.  Physical Examination  Vitals:   08/24/17 1257  BP: 122/66  Pulse: 71  Resp: 20  Temp: (!) 97.5 F (36.4 C)  TempSrc: Oral  SpO2: 96%   There is no height or weight on file to calculate BMI. Gen: WD/WN, NAD Head: [place]/AT, No temporalis wasting. Prominent temp pulse not noted. Ear/Nose/Throat: Hearing grossly intact, nares w/o erythema or drainage, oropharynx w/o Erythema/Exudate,  Eyes: Conjunctiva clear, sclera non-icteric Neck: Trachea midline.  No JVD.  Pulmonary:  Good air movement, respirations not labored, no use of accessory muscles.  Cardiac: RRR, normal S1, S2. Vascular: Right radiocephalic AV fistula good thrill good bruit Vessel Right Left  Radial Palpable Palpable  Ulnar Not Palpable Not Palpable  Brachial Palpable Palpable  Gastrointestinal: soft, non-tender/non-distended. No guarding/reflex.  Musculoskeletal: M/S 5/5 throughout.  Extremities without ischemic changes.  No deformity or atrophy.  Neurologic: Sensation grossly intact in extremities.  Symmetrical.  Speech is fluent. Motor exam as listed above. Psychiatric: Judgment intact, Mood & affect appropriate for pt's clinical situation. Dermatologic: No rashes or ulcers noted.  No cellulitis or open wounds. Lymph : No Cervical, Axillary, or Inguinal lymphadenopathy.   CBC Lab Results  Component Value Date   WBC 7.2 06/30/2017   HGB 10.8 (L) 06/30/2017   HCT 33.4 (L) 06/30/2017   MCV 95.2 06/30/2017   PLT 254 06/30/2017    BMET    Component Value Date/Time   NA 138 06/30/2017 0945   NA 142 04/24/2013 1557   K 3.6 06/30/2017 0945   K 3.8 04/24/2013 1557   CL 97 (L) 06/30/2017 0945   CL 106 04/24/2013 1557   CO2 27 06/30/2017 0945   CO2 32  04/24/2013 1557   GLUCOSE 109 (H) 06/30/2017 0945   GLUCOSE 109 (H) 04/24/2013 1557   BUN 56 (H) 06/30/2017 0945   BUN 40 (A) 09/03/2014   BUN 30 (H) 04/24/2013 1557   CREATININE 4.35 (H) 06/30/2017 0945   CREATININE 2.68 (H) 04/24/2013 1557   CALCIUM 8.0 (L) 06/30/2017 0945   CALCIUM 8.7 04/24/2013 1557   GFRNONAA 8 (L) 06/30/2017 0945   GFRNONAA 16 (L) 04/24/2013 1557   GFRAA 10 (L) 06/30/2017 0945   GFRAA 18 (L) 04/24/2013 1557   CrCl cannot be calculated (Patient's most recent lab result is older than the maximum 21 days allowed.).  COAG Lab Results  Component Value Date   INR 1.12 02/03/2017   INR 1.39 01/01/2017   INR 1.14 11/10/2016    Radiology No results found.  Assessment/Plan 1.  Complication dialysis device with thrombosis AV access:  Patient's left IJ tunneled catheter is malfunctioning. The patient has an extremity access that is functioning well. Therefore, the patient will undergo removal of the tunneled catheter under local anesthesia.  The risks and benefits were described to the patient.  All questions were answered.  The patient agrees to proceed with angiography and intervention. Potassium will be drawn to ensure that it is an appropriate level prior to performing intervention. 2.  End-stage renal disease requiring hemodialysis:  Patient will continue dialysis therapy without further interruption if a successful intervention is not achieved then a tunneled catheter will be placed. Dialysis has already been arranged. 3.  Hypertension:  Patient will continue medical management; nephrology is following no changes in oral medications. 4. Diabetes mellitus:  Glucose will be monitored and oral medications been held this morning once the patient has undergone the patient's procedure po intake will be reinitiated and again Accu-Cheks will be used to assess the blood glucose level and treat as needed. The patient will be restarted on the patient's usual hypoglycemic  regime 5.  Coronary artery disease:  EKG will be monitored. Nitrates will be used if needed. The patient's oral cardiac medications will be continued.    Hortencia Pilar, MD  08/24/2017 1:11 PM

## 2017-08-24 NOTE — Discharge Instructions (Signed)
Tunneled Catheter Removal, Care After °Refer to this sheet in the next few weeks. These instructions provide you with information about caring for yourself after your procedure. Your health care provider may also give you more specific instructions. Your treatment has been planned according to current medical practices, but problems sometimes occur. Call your health care provider if you have any problems or questions after your procedure. °What can I expect after the procedure? °After the procedure, it is common to have: °· Some mild redness, swelling, and pain around your catheter site. ° ° °Follow these instructions at home: °Incision care  °· Check your removal site  every day for signs of infection. Check for: °¨ More redness, swelling, or pain. °¨ More fluid or blood. °¨ Warmth. °¨ Pus or a bad smell. °· Follow instructions from your health care provider about how to take care of your removal site. Make sure you: °¨ Wash your hands with soap and water before you change your bandages (dressings). If soap and water are not available, use hand sanitizer. °Activity  °· Return to your normal activities as told by your health care provider. Ask your health care provider what activities are safe for you. °· Do not lift anything that is heavier than 10 lb (4.5 kg) for 3 weeks or as long as told by your health care provider. ° °Contact a health care provider if: °· You have more fluid or blood coming from your removal site °· You have more redness, swelling, or pain at your incisions or around the area where your catheter was removed °· Your removal site feel warm to the touch. °· You feel unusually weak. °· You feel nauseous.. °· Get help right away if °· You have swelling in your arm, shoulder, neck, or face. °· You develop chest pain. °· You have difficulty breathing. °· You feel dizzy or light-headed. °· You have pus or a bad smell coming from your removal site °· You have a fever. °· You develop bleeding from your  removal site, and your bleeding does not stop. °This information is not intended to replace advice given to you by your health care provider. Make sure you discuss any questions you have with your health care provider. °Document Released: 12/30/2011 Document Revised: 09/15/2015 Document Reviewed: 10/08/2014 °Elsevier Interactive Patient Education © 2017 [employer] ° °

## 2017-08-25 ENCOUNTER — Encounter: Payer: Self-pay | Admitting: Vascular Surgery

## 2017-08-30 ENCOUNTER — Other Ambulatory Visit: Payer: Self-pay

## 2017-08-30 ENCOUNTER — Inpatient Hospital Stay
Admission: EM | Admit: 2017-08-30 | Discharge: 2017-09-07 | DRG: 602 | Disposition: A | Discharge status: Skilled Nursing Facility | Payer: Medicare Other | Source: Skilled Nursing Facility | Attending: Internal Medicine | Admitting: Internal Medicine

## 2017-08-30 DIAGNOSIS — Z9842 Cataract extraction status, left eye: Secondary | ICD-10-CM

## 2017-08-30 DIAGNOSIS — I132 Hypertensive heart and chronic kidney disease with heart failure and with stage 5 chronic kidney disease, or end stage renal disease: Secondary | ICD-10-CM | POA: Diagnosis present

## 2017-08-30 DIAGNOSIS — R0602 Shortness of breath: Secondary | ICD-10-CM

## 2017-08-30 DIAGNOSIS — Z818 Family history of other mental and behavioral disorders: Secondary | ICD-10-CM

## 2017-08-30 DIAGNOSIS — I878 Other specified disorders of veins: Secondary | ICD-10-CM | POA: Diagnosis present

## 2017-08-30 DIAGNOSIS — Z825 Family history of asthma and other chronic lower respiratory diseases: Secondary | ICD-10-CM

## 2017-08-30 DIAGNOSIS — L089 Local infection of the skin and subcutaneous tissue, unspecified: Secondary | ICD-10-CM

## 2017-08-30 DIAGNOSIS — N186 End stage renal disease: Secondary | ICD-10-CM | POA: Diagnosis present

## 2017-08-30 DIAGNOSIS — F410 Panic disorder [episodic paroxysmal anxiety] without agoraphobia: Secondary | ICD-10-CM | POA: Diagnosis present

## 2017-08-30 DIAGNOSIS — L299 Pruritus, unspecified: Secondary | ICD-10-CM | POA: Diagnosis present

## 2017-08-30 DIAGNOSIS — T148XXA Other injury of unspecified body region, initial encounter: Secondary | ICD-10-CM | POA: Diagnosis present

## 2017-08-30 DIAGNOSIS — M1990 Unspecified osteoarthritis, unspecified site: Secondary | ICD-10-CM | POA: Diagnosis present

## 2017-08-30 DIAGNOSIS — F0390 Unspecified dementia without behavioral disturbance: Secondary | ICD-10-CM | POA: Diagnosis present

## 2017-08-30 DIAGNOSIS — E039 Hypothyroidism, unspecified: Secondary | ICD-10-CM | POA: Diagnosis present

## 2017-08-30 DIAGNOSIS — I2510 Atherosclerotic heart disease of native coronary artery without angina pectoris: Secondary | ICD-10-CM | POA: Diagnosis present

## 2017-08-30 DIAGNOSIS — D631 Anemia in chronic kidney disease: Secondary | ICD-10-CM | POA: Diagnosis present

## 2017-08-30 DIAGNOSIS — W19XXXA Unspecified fall, initial encounter: Secondary | ICD-10-CM | POA: Diagnosis present

## 2017-08-30 DIAGNOSIS — Z96652 Presence of left artificial knee joint: Secondary | ICD-10-CM | POA: Diagnosis present

## 2017-08-30 DIAGNOSIS — Z992 Dependence on renal dialysis: Secondary | ICD-10-CM | POA: Diagnosis not present

## 2017-08-30 DIAGNOSIS — Z833 Family history of diabetes mellitus: Secondary | ICD-10-CM

## 2017-08-30 DIAGNOSIS — G4733 Obstructive sleep apnea (adult) (pediatric): Secondary | ICD-10-CM | POA: Diagnosis present

## 2017-08-30 DIAGNOSIS — I959 Hypotension, unspecified: Secondary | ICD-10-CM | POA: Diagnosis not present

## 2017-08-30 DIAGNOSIS — Z86711 Personal history of pulmonary embolism: Secondary | ICD-10-CM

## 2017-08-30 DIAGNOSIS — R1310 Dysphagia, unspecified: Secondary | ICD-10-CM | POA: Diagnosis present

## 2017-08-30 DIAGNOSIS — R627 Adult failure to thrive: Secondary | ICD-10-CM | POA: Diagnosis present

## 2017-08-30 DIAGNOSIS — Z79890 Hormone replacement therapy (postmenopausal): Secondary | ICD-10-CM

## 2017-08-30 DIAGNOSIS — E43 Unspecified severe protein-calorie malnutrition: Secondary | ICD-10-CM | POA: Diagnosis present

## 2017-08-30 DIAGNOSIS — Z9119 Patient's noncompliance with other medical treatment and regimen: Secondary | ICD-10-CM

## 2017-08-30 DIAGNOSIS — K649 Unspecified hemorrhoids: Secondary | ICD-10-CM | POA: Diagnosis present

## 2017-08-30 DIAGNOSIS — Z801 Family history of malignant neoplasm of trachea, bronchus and lung: Secondary | ICD-10-CM

## 2017-08-30 DIAGNOSIS — I429 Cardiomyopathy, unspecified: Secondary | ICD-10-CM | POA: Diagnosis present

## 2017-08-30 DIAGNOSIS — Z888 Allergy status to other drugs, medicaments and biological substances status: Secondary | ICD-10-CM | POA: Diagnosis not present

## 2017-08-30 DIAGNOSIS — E1122 Type 2 diabetes mellitus with diabetic chronic kidney disease: Secondary | ICD-10-CM | POA: Diagnosis present

## 2017-08-30 DIAGNOSIS — Z9049 Acquired absence of other specified parts of digestive tract: Secondary | ICD-10-CM

## 2017-08-30 DIAGNOSIS — I425 Other restrictive cardiomyopathy: Secondary | ICD-10-CM

## 2017-08-30 DIAGNOSIS — K589 Irritable bowel syndrome without diarrhea: Secondary | ICD-10-CM | POA: Diagnosis present

## 2017-08-30 DIAGNOSIS — Z7189 Other specified counseling: Secondary | ICD-10-CM | POA: Diagnosis not present

## 2017-08-30 DIAGNOSIS — Z950 Presence of cardiac pacemaker: Secondary | ICD-10-CM

## 2017-08-30 DIAGNOSIS — N2581 Secondary hyperparathyroidism of renal origin: Secondary | ICD-10-CM | POA: Diagnosis present

## 2017-08-30 DIAGNOSIS — Z85828 Personal history of other malignant neoplasm of skin: Secondary | ICD-10-CM

## 2017-08-30 DIAGNOSIS — F329 Major depressive disorder, single episode, unspecified: Secondary | ICD-10-CM | POA: Diagnosis present

## 2017-08-30 DIAGNOSIS — H9190 Unspecified hearing loss, unspecified ear: Secondary | ICD-10-CM | POA: Diagnosis present

## 2017-08-30 DIAGNOSIS — Z79899 Other long term (current) drug therapy: Secondary | ICD-10-CM

## 2017-08-30 DIAGNOSIS — Z79891 Long term (current) use of opiate analgesic: Secondary | ICD-10-CM

## 2017-08-30 DIAGNOSIS — I252 Old myocardial infarction: Secondary | ICD-10-CM

## 2017-08-30 DIAGNOSIS — Z808 Family history of malignant neoplasm of other organs or systems: Secondary | ICD-10-CM

## 2017-08-30 DIAGNOSIS — M810 Age-related osteoporosis without current pathological fracture: Secondary | ICD-10-CM | POA: Diagnosis present

## 2017-08-30 DIAGNOSIS — Z681 Body mass index (BMI) 19 or less, adult: Secondary | ICD-10-CM | POA: Diagnosis not present

## 2017-08-30 DIAGNOSIS — Z515 Encounter for palliative care: Secondary | ICD-10-CM | POA: Diagnosis not present

## 2017-08-30 DIAGNOSIS — E1151 Type 2 diabetes mellitus with diabetic peripheral angiopathy without gangrene: Secondary | ICD-10-CM | POA: Diagnosis present

## 2017-08-30 DIAGNOSIS — I5022 Chronic systolic (congestive) heart failure: Secondary | ICD-10-CM | POA: Diagnosis present

## 2017-08-30 DIAGNOSIS — L03115 Cellulitis of right lower limb: Secondary | ICD-10-CM | POA: Diagnosis present

## 2017-08-30 DIAGNOSIS — R296 Repeated falls: Secondary | ICD-10-CM | POA: Diagnosis present

## 2017-08-30 DIAGNOSIS — F600 Paranoid personality disorder: Secondary | ICD-10-CM | POA: Diagnosis not present

## 2017-08-30 DIAGNOSIS — Z885 Allergy status to narcotic agent status: Secondary | ICD-10-CM

## 2017-08-30 DIAGNOSIS — Z90710 Acquired absence of both cervix and uterus: Secondary | ICD-10-CM

## 2017-08-30 DIAGNOSIS — L03116 Cellulitis of left lower limb: Secondary | ICD-10-CM | POA: Diagnosis present

## 2017-08-30 DIAGNOSIS — Z87891 Personal history of nicotine dependence: Secondary | ICD-10-CM

## 2017-08-30 DIAGNOSIS — R230 Cyanosis: Secondary | ICD-10-CM | POA: Diagnosis present

## 2017-08-30 DIAGNOSIS — S81811A Laceration without foreign body, right lower leg, initial encounter: Secondary | ICD-10-CM | POA: Diagnosis present

## 2017-08-30 DIAGNOSIS — Z7401 Bed confinement status: Secondary | ICD-10-CM

## 2017-08-30 DIAGNOSIS — Z9841 Cataract extraction status, right eye: Secondary | ICD-10-CM

## 2017-08-30 LAB — COMPREHENSIVE METABOLIC PANEL
ALK PHOS: 113 U/L (ref 38–126)
ALT: 16 U/L (ref 0–44)
ANION GAP: 15 (ref 5–15)
AST: 28 U/L (ref 15–41)
Albumin: 3.7 g/dL (ref 3.5–5.0)
BILIRUBIN TOTAL: 1.2 mg/dL (ref 0.3–1.2)
BUN: 64 mg/dL — ABNORMAL HIGH (ref 8–23)
CALCIUM: 8.9 mg/dL (ref 8.9–10.3)
CO2: 27 mmol/L (ref 22–32)
Chloride: 96 mmol/L — ABNORMAL LOW (ref 98–111)
Creatinine, Ser: 5.18 mg/dL — ABNORMAL HIGH (ref 0.44–1.00)
GFR, EST AFRICAN AMERICAN: 8 mL/min — AB (ref >60)
GFR, EST NON AFRICAN AMERICAN: 7 mL/min — AB (ref >60)
GLUCOSE: 117 mg/dL — AB (ref 70–99)
Potassium: 3.8 mmol/L (ref 3.5–5.1)
Sodium: 138 mmol/L (ref 135–145)
TOTAL PROTEIN: 7.6 g/dL (ref 6.5–8.1)

## 2017-08-30 LAB — CBC WITH DIFFERENTIAL/PLATELET
Basophils Absolute: 0 10*3/uL (ref 0–0.1)
Basophils Relative: 1 %
Eosinophils Absolute: 0.3 10*3/uL (ref 0–0.7)
Eosinophils Relative: 4 %
HEMATOCRIT: 34.6 % — AB (ref 35.0–47.0)
Hemoglobin: 11.5 g/dL — ABNORMAL LOW (ref 12.0–16.0)
LYMPHS ABS: 0.8 10*3/uL — AB (ref 1.0–3.6)
Lymphocytes Relative: 11 %
MCH: 32.2 pg (ref 26.0–34.0)
MCHC: 33.1 g/dL (ref 32.0–36.0)
MCV: 97.2 fL (ref 80.0–100.0)
MONOS PCT: 7 %
Monocytes Absolute: 0.5 10*3/uL (ref 0.2–0.9)
NEUTROS ABS: 5.2 10*3/uL (ref 1.4–6.5)
Neutrophils Relative %: 77 %
Platelets: 186 10*3/uL (ref 150–440)
RBC: 3.57 MIL/uL — ABNORMAL LOW (ref 3.80–5.20)
RDW: 15.3 % — ABNORMAL HIGH (ref 11.5–14.5)
WBC: 6.7 10*3/uL (ref 3.6–11.0)

## 2017-08-30 LAB — BLOOD GAS, ARTERIAL
ACID-BASE EXCESS: 3.9 mmol/L — AB (ref 0.0–2.0)
Bicarbonate: 29.7 mmol/L — ABNORMAL HIGH (ref 20.0–28.0)
FIO2: 0.4
O2 Saturation: 99.6 %
PCO2 ART: 49 mmHg — AB (ref 32.0–48.0)
PH ART: 7.39 (ref 7.350–7.450)
Patient temperature: 37
pO2, Arterial: 181 mmHg — ABNORMAL HIGH (ref 83.0–108.0)

## 2017-08-30 LAB — LACTIC ACID, PLASMA: Lactic Acid, Venous: 1.4 mmol/L (ref 0.5–1.9)

## 2017-08-30 LAB — MRSA PCR SCREENING: MRSA by PCR: POSITIVE — AB

## 2017-08-30 MED ORDER — ONDANSETRON HCL 4 MG/2ML IJ SOLN: 4.0000 mg | Freq: Four times a day (QID) | INTRAMUSCULAR | Status: DC | PRN

## 2017-08-30 MED ORDER — VANCOMYCIN HCL IN DEXTROSE 1-5 GM/200ML-% IV SOLN
1000.0000 mg | Freq: Once | INTRAVENOUS | Status: AC
  Administered 2017-08-30: 1000 mg via INTRAVENOUS
  Filled 2017-08-30: qty 200

## 2017-08-30 MED ORDER — HYDROCODONE-ACETAMINOPHEN 5-325 MG PO TABS
1.0000 | ORAL_TABLET | ORAL | Status: DC | PRN
  Filled 2017-08-30: qty 1

## 2017-08-30 MED ORDER — ACETAMINOPHEN 650 MG RE SUPP: 650.0000 mg | Freq: Four times a day (QID) | RECTAL | Status: DC | PRN

## 2017-08-30 MED ORDER — ONDANSETRON HCL 4 MG PO TABS: 4.0000 mg | ORAL_TABLET | Freq: Four times a day (QID) | ORAL | Status: DC | PRN

## 2017-08-30 MED ORDER — CHLORHEXIDINE GLUCONATE CLOTH 2 % EX PADS
6.0000 | MEDICATED_PAD | Freq: Every day | CUTANEOUS | Status: DC
  Administered 2017-09-02: 6 via TOPICAL

## 2017-08-30 MED ORDER — PIPERACILLIN-TAZOBACTAM 3.375 G IVPB: 3.3750 g | Freq: Two times a day (BID) | INTRAVENOUS | Status: DC

## 2017-08-30 MED ORDER — VANCOMYCIN HCL 500 MG IV SOLR: 500.0000 mg | INTRAVENOUS | Status: DC

## 2017-08-30 MED ORDER — SODIUM CHLORIDE 0.9 % IV SOLN
1.0000 g | INTRAVENOUS | Status: DC
  Administered 2017-08-30 – 2017-09-01 (×3): 1 g via INTRAVENOUS
  Filled 2017-08-30 (×3): qty 1
  Filled 2017-08-30: qty 10

## 2017-08-30 MED ORDER — HEPARIN SODIUM (PORCINE) 5000 UNIT/ML IJ SOLN
5000.0000 [IU] | Freq: Two times a day (BID) | INTRAMUSCULAR | Status: DC
  Administered 2017-08-30 – 2017-09-06 (×9): 5000 [IU] via SUBCUTANEOUS
  Filled 2017-08-30 (×9): qty 1

## 2017-08-30 MED ORDER — PIPERACILLIN-TAZOBACTAM 3.375 G IVPB
3.3750 g | Freq: Once | INTRAVENOUS | Status: AC
  Administered 2017-08-30: 3.375 g via INTRAVENOUS
  Filled 2017-08-30: qty 50

## 2017-08-30 MED ORDER — ACETAMINOPHEN 325 MG PO TABS
650.0000 mg | ORAL_TABLET | Freq: Four times a day (QID) | ORAL | Status: DC | PRN
  Administered 2017-08-30 – 2017-09-06 (×5): 650 mg via ORAL
  Filled 2017-08-30 (×4): qty 2

## 2017-08-30 NOTE — Progress Notes (Signed)
Pre HD assessment    08/30/17 1703  Neurological  Level of Consciousness Alert  Orientation Level Oriented X4  Respiratory  Respiratory Pattern Labored;Dyspnea with exertion;Dyspnea at rest  Chest Assessment Chest expansion asymmetrical  Cardiac  ECG Monitor Yes  Antiarrhythmic device Yes  Antiarrhythmic device  Antiarrhythmic device Permanent Pacemaker  Vascular  R Radial Pulse +2  L Radial Pulse +2  Integumentary  Integumentary (WDL) X  Skin Color Appropriate for ethnicity  Musculoskeletal  Musculoskeletal (WDL) X  Generalized Weakness Yes  Assistive Device None  GU Assessment  Genitourinary (WDL) X  Genitourinary Symptoms  (HD)  Psychosocial  Psychosocial (WDL) WDL

## 2017-08-30 NOTE — Progress Notes (Signed)
Pre HD assessment   08/30/17 1702  Vital Signs  Temp (!) 96.2 F (35.7 C)  Temp Source Axillary  Pulse Rate 85  Pulse Rate Source Monitor  Resp (!) 21  BP (!) 142/78  BP Location Left Arm  BP Method Automatic  Patient Position (if appropriate) Sitting  Oxygen Therapy  SpO2 98 %  O2 Device Nasal Cannula  O2 Flow Rate (L/min) 5 L/min  Pain Assessment  Pain Scale 0-10  Pain Score 10  Pain Type Acute pain  Pain Location Leg  Pain Orientation Right  Pain Descriptors / Indicators Aching  Pain Onset On-going  Pain Intervention(s) RN made aware  Multiple Pain Sites Yes  2nd Pain Site  Pain Type Acute pain  Pain Location Hip  Pain Orientation Left  Pain Onset On-going  Pain Intervention(s) RN made aware  Dialysis Weight  Weight 50.5 kg (111 lb 5.3 oz)  Type of Weight Pre-Dialysis  Time-Out for Hemodialysis  What Procedure? HD  Pt Identifiers(min of two) First/Last Name;MRN/Account#  Correct Site? Yes  Correct Side? Yes  Correct Procedure? Yes  Consents Verified? Yes  Rad Studies Available? N/A  Safety Precautions Reviewed? Yes  Engineer, civil (consulting) Number  (3A)  Station Number 4  UF/Alarm Test Passed  Conductivity: Meter 13.6  Conductivity: Machine  13.8  pH 7.6  Reverse Osmosis main  Normal Saline Lot Number [id number]  Dialyzer Lot Number [id number]  Disposable Set Lot Number [id number]  Machine Temperature 96.8 F (36 C)  Musician and Audible Yes  Blood Lines Intact and Secured Yes  Pre Treatment Patient Checks  Vascular access used during treatment Fistula  Hepatitis B Surface Antigen Results Negative  Date Hepatitis B Surface Antigen Drawn 06/29/17  Hepatitis B Surface Antibody  (unk )  Date Hepatitis B Surface Antibody Drawn 08/30/17  Hemodialysis Consent Verified Yes  Hemodialysis Standing Orders Initiated Yes  ECG (Telemetry) Monitor On Yes  Prime Ordered Normal Saline  Length of  DialysisTreatment -hour(s) 3 Hour(s)  Dialyzer Elisio 17H NR   Dialysate 3K, 2.5 Ca  Dialysis Anticoagulant None  Dialysate Flow Ordered 600  Blood Flow Rate Ordered 400 mL/min  Ultrafiltration Goal 1 Liters  Pre Treatment Labs  (HBSAG)  Dialysis Blood Pressure Support Ordered Normal Saline  Education / Care Plan  Dialysis Education Provided Yes  Documented Education in Care Plan Yes

## 2017-08-30 NOTE — Progress Notes (Signed)
Post HD assessment    08/30/17 2036  Neurological  Level of Consciousness Alert  Orientation Level Oriented X4  Respiratory  Respiratory Pattern Regular;Unlabored  Chest Assessment Chest expansion symmetrical  Cardiac  ECG Monitor Yes  Antiarrhythmic device Yes  Antiarrhythmic device  Antiarrhythmic device Permanent Pacemaker  Vascular  R Radial Pulse +2  L Radial Pulse +2  Integumentary  Integumentary (WDL) X  Skin Color Appropriate for ethnicity;Red  Musculoskeletal  Musculoskeletal (WDL) X  Generalized Weakness Yes  Assistive Device None  GU Assessment  Genitourinary (WDL) X  Genitourinary Symptoms  (HD)  Psychosocial  Psychosocial (WDL) WDL

## 2017-08-30 NOTE — Progress Notes (Signed)
Family Meeting Note  Advance Directive:yes  Today a meeting took place with the Patient, brother.  Patient is able to participate   The following clinical team members were present during this meeting:MD  The following were discussed:Patient's diagnosis: Dementia, end-stage renal disease, coronary artery disease, peripheral vascular disease, Patient's progosis: Unable to determine and Goals for treatment: DNI, No compressions  Additional follow-up to be provided: prn  Time spent during discussion:20 minutes  Gorden Harms, MD

## 2017-08-30 NOTE — Progress Notes (Signed)
HD tx start    08/30/17 1716  Vital Signs  Pulse Rate 84  Pulse Rate Source Monitor  Resp (!) 21  BP 131/68  BP Location Left Arm  BP Method Automatic  Patient Position (if appropriate) Sitting  Oxygen Therapy  SpO2 100 %  O2 Device Nasal Cannula  O2 Flow Rate (L/min) 5 L/min  During Hemodialysis Assessment  Blood Flow Rate (mL/min) 400 mL/min  Arterial Pressure (mmHg) -190 mmHg  Venous Pressure (mmHg) 200 mmHg  Transmembrane Pressure (mmHg) 60 mmHg  Ultrafiltration Rate (mL/min) 500 mL/min  Dialysate Flow Rate (mL/min) 600 ml/min  Conductivity: Machine  13.6  HD Safety Checks Performed Yes  Dialysis Fluid Bolus Normal Saline  Bolus Amount (mL) 250 mL  Intra-Hemodialysis Comments Tx initiated

## 2017-08-30 NOTE — ED Triage Notes (Signed)
Pt arrives via ems from [facility]. Ems report pt fell a week ago, obtaining right leg wound below the knee. Pt has started experiencing leg pain, redness and swelling around the wound. Bandage currently in place. Pt a&o on arrival. NAD noted at this time.  pt becomes SOB when speaking. Dorsal pedis Pulse can be felt in both lower extremities.

## 2017-08-30 NOTE — ED Provider Notes (Signed)
[facility] Emergency Department Provider Note   ____________________________________________    I have reviewed the triage vital signs and the nursing notes.   HISTORY  Chief Complaint Leg Pain     HPI Sharon [name] is a 84 y.o. female with significant past medical history as listed below who presents with complaints of right leg pain and redness.  Patient had a fall last week and suffered a skin tear, over the last several days she is developed redness and pain surrounding the skin tear.  Denies fevers, complains of weakness.  Has not taken anything for this   Past Medical History:  Diagnosis Date  . Anemia   . Anxiety   . Arthritis   . CAD (coronary artery disease)   . Cancer ([place])    skin  . Cardiomyopathy ([place])   . CHF (congestive heart failure) ([place])   . Depression   . IBS (irritable bowel syndrome) 2010  . Kidney failure July 2012   Hemodialysis 3xweek  . Kidney failure   . Meniere disease   . Meniere's disease   . Myocardial infarction ([place])   . OSA (obstructive sleep apnea)    CPAP  . Peritoneal dialysis status ([place])   . Presence of IVC filter 2019  . Presence of permanent cardiac pacemaker   . Renal insufficiency     Patient Active Problem List   Diagnosis Date Noted  . Left leg cellulitis 08/30/2017  . Elevated troponin 06/28/2017  . Swelling of limb 06/08/2017  . C. difficile diarrhea   . Palliative care encounter   . Encounter for hospice care discussion   . Protein-calorie malnutrition, severe 05/25/2017  . HCAP (healthcare-associated pneumonia) 05/24/2017  . Complication from renal dialysis device 04/01/2017  . Displaced fracture of lateral end of right clavicle, initial encounter for closed fracture 02/16/2017  . FTT (failure to thrive) in adult 02/03/2017  . ESRD on hemodialysis ([place])   . Pleural effusion   . Palliative care by specialist   . Pulmonary embolism ([place]) 01/02/2017  . Syncope 01/01/2017  .  Vision changes 08/27/2016  . Closed fracture of neck of femur ([place]) 05/26/2016  . Hip fracture ([place]) 05/10/2016  . Falls 04/27/2016  . Head injury 04/27/2016  . Age-related osteoporosis with current pathological fracture with routine healing 04/24/2016  . Recurrent falls 04/24/2016  . Mild protein-calorie malnutrition ([place]) 04/24/2016  . ESRD on dialysis ([place]) 04/10/2016  . Periprosthetic fracture around internal prosthetic left knee joint 01/26/2016  . Pressure injury of skin 12/15/2015  . Closed left subtrochanteric femur fracture ([place]) 12/15/2015  . Femur fracture, left ([place]) 12/14/2015  . Meniere disease   . Loss of weight 10/21/2015  . Clinical depression 06/20/2015  . Dysphagia 05/24/2015  . Imbalance 04/22/2015  . Chronic pain syndrome 05/22/2014  . Insomnia 03/13/2014  . Anxiety state 03/13/2014  . Chronic systolic CHF (congestive heart failure), NYHA class 3 ([place]) 01/15/2014  . OSA (obstructive sleep apnea) 01/15/2014  . Basal cell carcinoma of neck 11/14/2013  . DDD (degenerative disc disease), cervical 11/07/2013  . Cervical radiculitis 10/16/2013  . Benign essential hypertension 09/12/2013  . Memory loss 09/12/2013  . Systolic heart failure, chronic ([place]) 05/12/2013  . Goals of care, counseling/discussion 11/10/2012  . Sinus node dysfunction ([place]) 08/01/2012  . Low back pain 08/01/2012  . Cervical spine pain 05/02/2012  . Irritable bowel syndrome 11/02/2011  . Depression 04/01/2011  . Hypertension 04/01/2011    Past Surgical History:  Procedure Laterality  Date  . [place]  . ABDOMINAL HYSTERECTOMY    . ANUS SURGERY  2010  . AV FISTULA PLACEMENT Right 04/15/2016   Procedure: ARTERIOVENOUS (AV) FISTULA CREATION ( RADIOCEPHALIC ) STAGE 2;  Surgeon: Algernon Huxley, MD;  Location: [facility] ORS;  Service: Vascular;  Laterality: Right;  . CATARACT EXTRACTION  2006, 2011  . CHOLECYSTECTOMY  01/2008  . DIALYSIS/PERMA CATHETER INSERTION N/A 01/07/2017     Procedure: DIALYSIS/PERMA CATHETER INSERTION With an IVC filter placement;  Surgeon: Algernon Huxley, MD;  Location: [place] [facility];  Service: Cardiovascular;  Laterality: N/A;  . DIALYSIS/PERMA CATHETER REMOVAL N/A 08/20/2016   Procedure: Dialysis/Perma Catheter Removal;  Surgeon: Algernon Huxley, MD;  Location: [place] [facility];  Service: Cardiovascular;  Laterality: N/A;  . DIALYSIS/PERMA CATHETER REMOVAL N/A 08/24/2017   Procedure: DIALYSIS/PERMA CATHETER REMOVAL;  Surgeon: Katha Cabal, MD;  Location: [place] [facility];  Service: Cardiovascular;  Laterality: N/A;  . EYE SURGERY     cataracts bilateral  . FEMUR IM NAIL Left 12/15/2015   Procedure: INTRAMEDULLARY (IM) RETROGRADE FEMORAL NAILING;  Surgeon: Oletta Cohn, DO;  Location: [facility] ORS;  Service: Orthopedics;  Laterality: Left;  . HIP PINNING,CANNULATED Left 05/10/2016   Procedure: CANNULATED HIP PINNING;  Surgeon: Earnestine Leys, MD;  Location: [facility] ORS;  Service: Orthopedics;  Laterality: Left;  . INSERT / REPLACE / REMOVE PACEMAKER    . INSERTION OF DIALYSIS CATHETER  07/2010  . IVC FILTER INSERTION  2019  . JOINT REPLACEMENT     left knee  . MINOR REMOVAL OF PERITONEAL DIALYSIS CATHETER  04/15/2016   Procedure: MINOR REMOVAL OF PERITONEAL DIALYSIS CATHETER;  Surgeon: Algernon Huxley, MD;  Location: [facility] ORS;  Service: Vascular;;  . ORIF CLAVICULAR FRACTURE Right 02/16/2017   Procedure: OPEN REDUCTION INTERNAL FIXATION (ORIF) CLAVICULAR FRACTURE;  Surgeon: Corky Mull, MD;  Location: [facility] ORS;  Service: Orthopedics;  Laterality: Right;  . PACEMAKER INSERTION  2006  . PERIPHERAL VASCULAR CATHETERIZATION N/A 12/18/2015   Procedure: Dialysis/Perma Catheter Insertion;  Surgeon: Katha Cabal, MD;  Location: [place] [facility];  Service: Cardiovascular;  Laterality: N/A;  . TOTAL KNEE ARTHROPLASTY  2008   LEFT/Dr [place]    Prior to Admission medications   Medication Sig Start Date End Date Taking? Authorizing Provider   acetaminophen (TYLENOL) 325 MG tablet Take 2 tablets (650 mg total) by mouth every 4 (four) hours as needed for mild pain or moderate pain. 05/14/16  Yes Wieting, Richard, MD  benzonatate (TESSALON) 200 MG capsule Take 1 capsule (200 mg total) by mouth 3 (three) times daily. 06/01/17  Yes Dustin Flock, MD  guaiFENesin-dextromethorphan [facility] DM) 100-10 MG/5ML syrup Take 5 mLs by mouth every 4 (four) hours as needed for cough. 06/01/17  Yes Dustin Flock, MD  hydrocortisone 2.5 % cream Apply 1 application topically 3 (three) times daily.    Yes [provider]  hydrOXYzine (ATARAX/VISTARIL) 25 MG tablet Take 12.5 mg by mouth 3 (three) times daily.   Yes [provider]  latanoprost (XALATAN) 0.005 % ophthalmic solution Place 1 drop into both eyes at bedtime.   Yes [provider]  levothyroxine (SYNTHROID, LEVOTHROID) 25 MCG tablet Take 1 tablet (25 mcg total) by mouth daily. 02/04/17  Yes Bettey Costa, MD  lidocaine (LMX) 4 % cream Apply 1 application topically every Monday, Wednesday, and Friday with hemodialysis.   Yes [provider]  LORazepam (ATIVAN) 0.5 MG tablet Take 0.5 mg by  mouth every 6 (six) hours as needed for anxiety.   Yes [provider]  metoCLOPramide (REGLAN) 5 MG tablet Take 5 mg by mouth every 8 (eight) hours as needed (for vertigo).   Yes [provider]  metoprolol succinate (TOPROL-XL) 25 MG 24 hr tablet Take 1 tablet (25 mg total) by mouth daily. 06/01/17  Yes Dustin Flock, MD  Multiple Vitamin tablet Take 1 tablet by mouth daily.    Yes [provider]  sevelamer carbonate (RENVELA) 800 MG tablet Take 1 tablet (800 mg total) by mouth 3 (three) times daily with meals. 06/01/17  Yes Dustin Flock, MD  traMADol (ULTRAM) 50 MG tablet Take 1-2 tablets (50-100 mg total) by mouth every 6 (six) hours as needed for moderate pain. 07/01/17  Yes Dustin Flock, MD  vitamin C (ASCORBIC ACID) 500 MG tablet Take 500 mg by  mouth daily.   Yes [provider]  Vitamin D, Ergocalciferol, (DRISDOL) 50000 units CAPS capsule Take 50,000 Units by mouth every Monday.  04/17/15  Yes [provider]     Allergies Statins; Codeine sulfate; Codeine; Effexor [venlafaxine]; and Ezetimibe-simvastatin  Family History  Problem Relation Age of Onset  . Cancer Mother        ? ovarian - sarcoma  . Cancer Father        Skin cancer  . Diabetes Sister   . COPD Sister   . Depression Sister   . Cancer Sister        Lung - 59 yrs old    Social History Social History   Tobacco Use  . Smoking status: Former Smoker    Last attempt to quit: 03/31/1948    Years since quitting: 69.4  . Smokeless tobacco: Never Used  Substance Use Topics  . Alcohol use: No  . Drug use: No    Review of Systems  Constitutional: No reports of fever Eyes: No visual changes.  ENT: No neck pain Cardiovascular: Denies chest pain. Respiratory: No cough Gastrointestinal: No abdominal pain.  Genitourinary: Negative for dysuria. Musculoskeletal: Negative for back pain. Skin: As above Neurological: Negative for headaches weakness   ____________________________________________   PHYSICAL EXAM:  VITAL SIGNS: ED Triage Vitals  Enc Vitals Group     BP 08/30/17 1200 135/77     Pulse Rate 08/30/17 1200 87     Resp 08/30/17 1200 (!) 21     Temp 08/30/17 1200 98 F (36.7 C)     Temp Source 08/30/17 1200 Oral     SpO2 08/30/17 1149 99 %     Weight 08/30/17 1201 45.4 kg (100 lb)     Height 08/30/17 1201 1.626 m (5\' 4" )     Head Circumference --      Peak Flow --      Pain Score 08/30/17 1201 10     Pain Loc --      Pain Edu? --      Excl. in [place]? --     Constitutional: Alert and oriented. No acute distress. Pleasant and interactive Eyes: Conjunctivae are normal.    Mouth/Throat: Mucous membranes are moist.    Cardiovascular: Normal rate, regular rhythm. Grossly normal heart sounds.  Good peripheral  circulation. Respiratory: Normal respiratory effort.  No retractions.  Gastrointestinal: Soft and nontender. No distention.   Musculoskeletal: No lower extremity tenderness nor edema.  Warm and well perfused Neurologic:  Normal speech and language. No gross focal neurologic deficits are appreciated.  Skin:  Skin is warm, dry.  Skin  tear x2 noted to the right anterior lower leg, erythema extending down towards the ankle with tenderness and small bulla with serous segment is fluid Psychiatric: Mood and affect are normal. Speech and behavior are normal.  ____________________________________________   LABS (all labs ordered are listed, but only abnormal results are displayed)  Labs Reviewed  CBC WITH DIFFERENTIAL/PLATELET - Abnormal; Notable for the following components:      Result Value   RBC 3.57 (*)    Hemoglobin 11.5 (*)    HCT 34.6 (*)    RDW 15.3 (*)    Lymphs Abs 0.8 (*)    All other components within normal limits  COMPREHENSIVE METABOLIC PANEL - Abnormal; Notable for the following components:   Chloride 96 (*)    Glucose, Bld 117 (*)    BUN 64 (*)    Creatinine, Ser 5.18 (*)    GFR calc non Af Amer 7 (*)    GFR calc Af Amer 8 (*)    All other components within normal limits  CULTURE, BLOOD (ROUTINE X 2)  CULTURE, BLOOD (ROUTINE X 2)  LACTIC ACID, PLASMA  LACTIC ACID, PLASMA   ____________________________________________  EKG  None ____________________________________________  RADIOLOGY  None ____________________________________________   PROCEDURES  Procedure(s) performed: No  Procedures   Critical Care performed: No ____________________________________________   INITIAL IMPRESSION / ASSESSMENT AND PLAN / ED COURSE  Pertinent labs & imaging results that were available during my care of the patient were reviewed by me and considered in my medical decision making (see chart for details).  Patient presents with evidence of wound infection to the  right leg.  Given extent of infection and comorbidities will treat with IV antibiotics and admit to the hospital service    ____________________________________________   FINAL CLINICAL IMPRESSION(S) / ED DIAGNOSES  Final diagnoses:  Wound infection        Note:  This document was prepared using Dragon voice recognition software and may include unintentional dictation errors.    Lavonia Drafts, MD 08/30/17 1426

## 2017-08-30 NOTE — H&P (Signed)
[facility] [name]    MR#:  [id number]  DATE OF BIRTH:  1927-01-07  DATE OF ADMISSION:  08/30/2017  PRIMARY CARE PHYSICIAN: Kirk Ruths, MD   REQUESTING/REFERRING PHYSICIAN:   CHIEF COMPLAINT:   Chief Complaint  Patient presents with  . Leg Pain    HISTORY OF PRESENT ILLNESS: Sharon [name]  is a 84 y.o. female with a known history of history per below which includes frequent falls, patient presents to the emergency room from extended care facility for worsening right lower extremity redness/swelling, in the emergency room patient was found to have old superficial anterior leg wound on the right leg with associated cellulitis, ER work-up otherwise unimpressive, patient does have end-stage renal disease for which patient is due for hemodialysis-typically receives on Mondays/Wednesdays/Fridays, patient evaluated in the emergency room, the patient's power of attorney/younger brother is present, noted bilateral lower extremity cyanosis-patient followed by vascular surgery on an outpatient basis, patient denies any pain at this time, patient now be admitted for acute right lower extremity cellulitis, chronic end-stage renal disease in need of dialysis today, and bilateral lower extremities cyanosis.  PAST MEDICAL HISTORY:   Past Medical History:  Diagnosis Date  . Anemia   . Anxiety   . Arthritis   . CAD (coronary artery disease)   . Cancer ([place])    skin  . Cardiomyopathy ([place])   . CHF (congestive heart failure) ([place])   . Depression   . IBS (irritable bowel syndrome) 2010  . Kidney failure July 2012   Hemodialysis 3xweek  . Kidney failure   . Meniere disease   . Meniere's disease   . Myocardial infarction ([place])   . OSA (obstructive sleep apnea)    CPAP  . Peritoneal dialysis status ([place])   . Presence of IVC filter 2019  . Presence of permanent cardiac pacemaker   . Renal insufficiency     PAST SURGICAL  HISTORY:  Past Surgical History:  Procedure Laterality Date  . [place]  . ABDOMINAL HYSTERECTOMY    . ANUS SURGERY  2010  . AV FISTULA PLACEMENT Right 04/15/2016   Procedure: ARTERIOVENOUS (AV) FISTULA CREATION ( RADIOCEPHALIC ) STAGE 2;  Surgeon: Algernon Huxley, MD;  Location: [facility];  Service: Vascular;  Laterality: Right;  . CATARACT EXTRACTION  2006, 2011  . CHOLECYSTECTOMY  01/2008  . DIALYSIS/PERMA CATHETER INSERTION N/A 01/07/2017   Procedure: DIALYSIS/PERMA CATHETER INSERTION With an IVC filter placement;  Surgeon: Algernon Huxley, MD;  Location: [facility];  Service: Cardiovascular;  Laterality: N/A;  . DIALYSIS/PERMA CATHETER REMOVAL N/A 08/20/2016   Procedure: Dialysis/Perma Catheter Removal;  Surgeon: Algernon Huxley, MD;  Location: [facility];  Service: Cardiovascular;  Laterality: N/A;  . DIALYSIS/PERMA CATHETER REMOVAL N/A 08/24/2017   Procedure: DIALYSIS/PERMA CATHETER REMOVAL;  Surgeon: Katha Cabal, MD;  Location: [facility];  Service: Cardiovascular;  Laterality: N/A;  . EYE SURGERY     cataracts bilateral  . FEMUR IM NAIL Left 12/15/2015   Procedure: INTRAMEDULLARY (IM) RETROGRADE FEMORAL NAILING;  Surgeon: Oletta Cohn, DO;  Location: [facility];  Service: Orthopedics;  Laterality: Left;  . HIP PINNING,CANNULATED Left 05/10/2016   Procedure: CANNULATED HIP PINNING;  Surgeon: Earnestine Leys, MD;  Location: [facility];  Service: Orthopedics;  Laterality: Left;  . INSERT / REPLACE / REMOVE PACEMAKER    . INSERTION OF DIALYSIS CATHETER  07/2010  . IVC  FILTER INSERTION  2019  . JOINT REPLACEMENT     left knee  . MINOR REMOVAL OF PERITONEAL DIALYSIS CATHETER  04/15/2016   Procedure: MINOR REMOVAL OF PERITONEAL DIALYSIS CATHETER;  Surgeon: Algernon Huxley, MD;  Location: [facility];  Service: Vascular;;  . ORIF CLAVICULAR FRACTURE Right 02/16/2017   Procedure: OPEN REDUCTION INTERNAL FIXATION (ORIF) CLAVICULAR FRACTURE;  Surgeon: Corky Mull, MD;  Location: [facility];  Service: Orthopedics;  Laterality: Right;  . PACEMAKER INSERTION  2006  . PERIPHERAL VASCULAR CATHETERIZATION N/A 12/18/2015   Procedure: Dialysis/Perma Catheter Insertion;  Surgeon: Katha Cabal, MD;  Location: [facility];  Service: Cardiovascular;  Laterality: N/A;  . TOTAL KNEE ARTHROPLASTY  2008   LEFT/Dr [place]    SOCIAL HISTORY:  Social History   Tobacco Use  . Smoking status: Former Smoker    Last attempt to quit: 03/31/1948    Years since quitting: 69.4  . Smokeless tobacco: Never Used  Substance Use Topics  . Alcohol use: No    FAMILY HISTORY:  Family History  Problem Relation Age of Onset  . Cancer Mother        ? ovarian - sarcoma  . Cancer Father        Skin cancer  . Diabetes Sister   . COPD Sister   . Depression Sister   . Cancer Sister        Lung - 63 yrs old    DRUG ALLERGIES:  Allergies  Allergen Reactions  . Statins Other (See Comments)    Muscle weakness severe  . Codeine Sulfate Nausea And Vomiting  . Codeine Other (See Comments)    GI UPSET  . Effexor [Venlafaxine] Nausea Only  . Ezetimibe-Simvastatin Other (See Comments)    Muscle weakness    REVIEW OF SYSTEMS:   CONSTITUTIONAL: No fever, fatigue or weakness.  EYES: No blurred or double vision.  EARS, NOSE, AND THROAT: No tinnitus or ear pain.  RESPIRATORY: No cough, shortness of breath, wheezing or hemoptysis.  CARDIOVASCULAR: No chest pain, orthopnea, edema.  GASTROINTESTINAL: No nausea, vomiting, diarrhea or abdominal pain.  GENITOURINARY: No dysuria, hematuria.  ENDOCRINE: No polyuria, nocturia,  HEMATOLOGY: No anemia, easy bruising or bleeding SKIN: Right anterior leg healing laceration, leg swelling/redness  MUSCULOSKELETAL: No joint pain or arthritis.   NEUROLOGIC: No tingling, numbness, weakness.  PSYCHIATRY: No anxiety or depression.   MEDICATIONS AT HOME:  Prior to Admission medications   Medication Sig Start Date End Date  Taking? Authorizing Provider  acetaminophen (TYLENOL) 325 MG tablet Take 2 tablets (650 mg total) by mouth every 4 (four) hours as needed for mild pain or moderate pain. 05/14/16   Loletha Grayer, MD  benzonatate (TESSALON) 200 MG capsule Take 1 capsule (200 mg total) by mouth 3 (three) times daily. 06/01/17   Dustin Flock, MD  guaiFENesin-dextromethorphan (ROBITUSSIN DM) 100-10 MG/5ML syrup Take 5 mLs by mouth every 4 (four) hours as needed for cough. 06/01/17   Dustin Flock, MD  hydrocortisone 2.5 % cream Apply 1 application topically 3 (three) times daily.     [provider]  hydrOXYzine (ATARAX/VISTARIL) 25 MG tablet Take 12.5 mg by mouth 3 (three) times daily.    [provider]  latanoprost (XALATAN) 0.005 % ophthalmic solution Place 1 drop into both eyes at bedtime.    [provider]  levothyroxine (SYNTHROID, LEVOTHROID) 25 MCG tablet Take 1 tablet (25 mcg total) by mouth daily. 02/04/17   Bettey Costa, MD  metoCLOPramide (REGLAN)  5 MG tablet Take 5 mg by mouth every 8 (eight) hours as needed (for vertigo).    [provider]  metoprolol succinate (TOPROL-XL) 25 MG 24 hr tablet Take 1 tablet (25 mg total) by mouth daily. 06/01/17   Dustin Flock, MD  MULTIPLE VITAMIN PO Take 1 tablet by mouth daily.    [provider]  sevelamer carbonate (RENVELA) 800 MG tablet Take 1 tablet (800 mg total) by mouth 3 (three) times daily with meals. 06/01/17   Dustin Flock, MD  traMADol (ULTRAM) 50 MG tablet Take 1-2 tablets (50-100 mg total) by mouth every 6 (six) hours as needed for moderate pain. 07/01/17   Dustin Flock, MD  vitamin C (ASCORBIC ACID) 500 MG tablet Take 500 mg by mouth daily.    [provider]  Vitamin D, Ergocalciferol, (DRISDOL) 50000 units CAPS capsule Take 50,000 Units by mouth every Monday.  04/17/15   [provider]      PHYSICAL EXAMINATION:   VITAL SIGNS: Blood pressure 135/77, pulse 87, temperature 98 F (36.7  C), temperature source Oral, resp. rate (!) 21, height 5\' 4"  (1.626 m), weight 45.4 kg (100 lb), SpO2 98 %.  GENERAL:  84 y.o.-year-old patient lying in the bed with no acute distress.  Cachectic appearing EYES: Pupils equal, round, reactive to light and accommodation. No scleral icterus. Extraocular muscles intact.  HEENT: Head atraumatic, normocephalic. Oropharynx and nasopharynx clear.  NECK:  Supple, no jugular venous distention. No thyroid enlargement, no tenderness.  LUNGS: Normal breath sounds bilaterally, no wheezing, rales,rhonchi or crepitation. No use of accessory muscles of respiration.  CARDIOVASCULAR: S1, S2 normal. No murmurs, rubs, or gallops.  ABDOMEN: Soft, nontender, nondistended. Bowel sounds present. No organomegaly or mass.  EXTREMITIES: No pedal edema, cyanosis, or clubbing.  Diffuse severe muscular wasting NEUROLOGIC: Cranial nerves II through XII are intact. MAES. Gait not checked.  PSYCHIATRIC: The patient is alert and oriented x 3.  SKIN: Right leg healing laceration, with acute cellulitis, bilateral lower extremity cyanosis   LABORATORY PANEL:   CBC Recent Labs  Lab 08/30/17 1222  WBC 6.7  HGB 11.5*  HCT 34.6*  PLT 186  MCV 97.2  MCH 32.2  MCHC 33.1  RDW 15.3*  LYMPHSABS 0.8*  MONOABS 0.5  EOSABS 0.3  BASOSABS 0.0   ------------------------------------------------------------------------------------------------------------------  Chemistries  Recent Labs  Lab 08/30/17 1222  NA 138  K 3.8  CL 96*  CO2 27  GLUCOSE 117*  BUN 64*  CREATININE 5.18*  CALCIUM 8.9  AST 28  ALT 16  ALKPHOS 113  BILITOT 1.2   ------------------------------------------------------------------------------------------------------------------ estimated creatinine clearance is 5.4 mL/min (A) (by C-G formula based on SCr of 5.18 mg/dL (H)). ------------------------------------------------------------------------------------------------------------------ No results  for input(s): TSH, T4TOTAL, T3FREE, THYROIDAB in the last 72 hours.  Invalid input(s): FREET3   Coagulation profile No results for input(s): INR, PROTIME in the last 168 hours. ------------------------------------------------------------------------------------------------------------------- No results for input(s): DDIMER in the last 72 hours. -------------------------------------------------------------------------------------------------------------------  Cardiac Enzymes No results for input(s): CKMB, TROPONINI, MYOGLOBIN in the last 168 hours.  Invalid input(s): CK ------------------------------------------------------------------------------------------------------------------ Invalid input(s): POCBNP  ---------------------------------------------------------------------------------------------------------------  Urinalysis    Component Value Date/Time   COLORURINE AMBER (A) 06/28/2017 0906   APPEARANCEUR HAZY (A) 06/28/2017 0906   LABSPEC 1.023 06/28/2017 0906   PHURINE 5.0 06/28/2017 0906   GLUCOSEU 50 (A) 06/28/2017 0906   HGBUR NEGATIVE 06/28/2017 0906   BILIRUBINUR NEGATIVE 06/28/2017 0906   BILIRUBINUR negative 09/12/2013 1251   KETONESUR NEGATIVE 06/28/2017 0906  PROTEINUR >=300 (A) 06/28/2017 0906   UROBILINOGEN 0.2 09/12/2013 1251   NITRITE NEGATIVE 06/28/2017 0906   LEUKOCYTESUR NEGATIVE 06/28/2017 0906     RADIOLOGY: No results found.  EKG: Orders placed or performed during the hospital encounter of 06/28/17  . ED EKG  . ED EKG  . EKG    IMPRESSION AND PLAN: 84 year old female with past medical history of ESRD - HD M/W/F, OSA, dementia, history of recurrent falls, CAD, ICM w/ EF 19-14%, chronic systolic CHF, secondary hyperparathyroidism who presents to the hospital with right lower extremity cellulitis   *Acute right lower extremity cellulitis  Secondary to acute right leg laceration, noted history of frequent falling events Admit to regular  nursing floor bed, cellulitis protocol, IV Rocephin for now  *Chronic end-stage renal disease on hemodialysis M/W/F Nephrology consulted for hemodialysis needs as patient is due for hemodialysis today  *Acute bilateral lower extreme the cyanosis Consult vascular surgery for continuity of care/further evaluation  *Chronic diabetes mellitus type 2 Sliding scale insulin with Accu-Cheks per routine   *Chronic benign essential hypertension  Stable  Continue home regiment   *Chronic systolic congestive heart failure with cardiomyopathy ejection fraction 20-25% Mild exacerbation due to end-stage renal disease in need of hemodialysis Continue metoprolol, supplemental oxygen wean as tolerated  *Chronic hypothyroidism  Stable  continue synthroid  *Chronic secondary hyperparathyroidism continue Renvela.    All the records are reviewed and case discussed with ED provider. Management plans discussed with the patient, family and they are in agreement.  CODE STATUS:DNI, no compressions Code Status History    Date Active Date Inactive Code Status Order ID Comments User Context   06/29/2017 0905 07/01/2017 2019 DNR [id number]  Dustin Flock, MD Inpatient   06/28/2017 1239 06/29/2017 0904 DNR [id number]  Henreitta Leber, MD Inpatient   05/24/2017 2258 06/01/2017 1706 DNR [id number]  Vaughan Basta, MD Inpatient   02/16/2017 1928 02/17/2017 1949 Full Code [id number]  Poggi, Marshall Cork, MD Inpatient   02/03/2017 1230 02/04/2017 1907 DNR [id number]  Nicholes Mango, MD ED   01/01/2017 2027 01/08/2017 1858 Full Code [id number]  [name], MD ED   05/12/2016 0809 05/14/2016 1439 DNR [id number]  Max Sane, MD Inpatient   05/10/2016 0506 05/12/2016 0809 Full Code [id number]  Saundra Shelling, MD Inpatient   12/15/2015 1515 12/19/2015 0159 Full Code [id number]  Oletta Cohn, DO Inpatient   12/14/2015 1613 12/15/2015 1515 Full Code [id number]  Bettey Costa, MD Inpatient    Questions for Most Recent Historical Code  Status (Order [id number])    Question Answer Comment   In the event of cardiac or respiratory ARREST Do not call a "code blue"    In the event of cardiac or respiratory ARREST Do not perform Intubation, CPR, defibrillation or ACLS    In the event of cardiac or respiratory ARREST Use medication by any route, position, wound care, and other measures to relive pain and suffering. May use oxygen, suction and manual treatment of airway obstruction as needed for comfort.        TOTAL TIME TAKING CARE OF THIS PATIENT: 45 minutes.    Avel Peace Salary M.D on 08/30/2017   Between 7am to 6pm - Pager - [phone]  After 6pm go to [email or web address] - password [email or web address] [facility]  Office  [phone]  CC: Primary care physician; Kirk Ruths, MD   Note: This dictation was prepared with Dragon dictation along with smaller phrase technology. Any transcriptional errors that result from this process are  unintentional.

## 2017-08-30 NOTE — Progress Notes (Signed)
Post HD assessment. Pt tolerated tx well without complication. Pt c/o sob during the first half of tx, breathing improved post HD tx, MD aware. Net UF 1433, goal met.    08/30/17 2037  Vital Signs  Temp (!) 96.9 F (36.1 C)  Temp Source Axillary  Pulse Rate 88  Pulse Rate Source Monitor  Resp 17  BP 120/65  BP Location Left Arm  BP Method Automatic  Patient Position (if appropriate) Sitting  Oxygen Therapy  SpO2 99 %  O2 Device Nasal Cannula  O2 Flow Rate (L/min) 5 L/min  Dialysis Weight  Weight 49.2 kg (108 lb 7.5 oz)  Type of Weight Post-Dialysis  Post-Hemodialysis Assessment  Rinseback Volume (mL) 250 mL  KECN 65.8 V  Dialyzer Clearance Lightly streaked  Duration of HD Treatment -hour(s) 3 hour(s)  Hemodialysis Intake (mL) 500 mL  UF Total -Machine (mL) 1933 mL  Net UF (mL) 1433 mL  Tolerated HD Treatment Yes  AVG/AVF Arterial Site Held (minutes) 10 minutes  AVG/AVF Venous Site Held (minutes) 10 minutes  Education / Care Plan  Dialysis Education Provided Yes  Documented Education in Care Plan Yes

## 2017-08-30 NOTE — Progress Notes (Signed)
1st attempt for Pre-HD report    08/30/17 1620  Hand-Off documentation  Report given to (Full Name) Stark Bray  Report received from (Full Name) Barnabas Lister Dougherty,RN

## 2017-08-30 NOTE — Progress Notes (Signed)
HD tx end    08/30/17 2023  Vital Signs  Pulse Rate 88  Pulse Rate Source Monitor  Resp 19  BP 126/64  BP Location Left Arm  BP Method Automatic  Patient Position (if appropriate) Sitting  Oxygen Therapy  SpO2 100 %  O2 Device Nasal Cannula  O2 Flow Rate (L/min) 5 L/min  During Hemodialysis Assessment  Dialysis Fluid Bolus Normal Saline  Bolus Amount (mL) 250 mL  Intra-Hemodialysis Comments Tx completed

## 2017-08-30 NOTE — Consult Note (Signed)
[facility] Vascular Consult Note  MRN : [id number]  Sharon [name] is a 84 y.o. (1926/12/11) female who presents with chief complaint of  Chief Complaint  Patient presents with  . Leg Pain  .  History of Present Illness: I am asked by Dr. Jerelyn Charles to see the patient regarding right leg cellulitis.  Apparently, the patient fell and created 2 wounds on her legs.  One is just below the lateral knee and the other is in the mid anterior calf area.  Both are on the right leg.  There is significant redness and swelling although she says that has improved since she has been in the hospital.  She does not find it that painful.  She has very thin skin and this tears easily there is no purulent drainage.  No erythema spreading up the leg.  No fever since admission.  She is due for her dialysis and is complaining of shortness of breath.  Her lower extremities were studied earlier this year with ABIs which were normal with triphasic waveforms and ABIs of 1.2 bilaterally.  She had bilateral venous duplex which showed no DVT, superficial thrombophlebitis, or reflux in either lower extremity.  Current Facility-Administered Medications  Medication Dose Route Frequency Provider Last Rate Last Dose  . acetaminophen (TYLENOL) tablet 650 mg  650 mg Oral Q6H PRN Salary, Montell D, MD       Or  . acetaminophen (TYLENOL) suppository 650 mg  650 mg Rectal Q6H PRN Salary, Montell D, MD      . cefTRIAXone (ROCEPHIN) 1 g in sodium chloride 0.9 % 100 mL IVPB  1 g Intravenous Q24H Salary, Montell D, MD      . Derrill Memo ON 08/31/2017] Chlorhexidine Gluconate Cloth 2 % PADS 6 each  6 each Topical Q0600 Kolluru, Sarath, MD      . heparin injection 5,000 Units  5,000 Units Subcutaneous Q12H Salary, Montell D, MD      . HYDROcodone-acetaminophen (NORCO/VICODIN) 5-325 MG per tablet 1-2 tablet  1-2 tablet Oral Q4H PRN Salary, Montell D, MD      . ondansetron (ZOFRAN) tablet 4 mg  4 mg Oral Q6H PRN Salary,  Montell D, MD       Or  . ondansetron (ZOFRAN) injection 4 mg  4 mg Intravenous Q6H PRN Salary, Avel Peace, MD       Facility-Administered Medications Ordered in Other Encounters  Medication Dose Route Frequency Provider Last Rate Last Dose  . ceFAZolin (ANCEF) IVPB 1 g/50 mL premix  1 g Intravenous Once Stegmayer, Janalyn Harder, PA-C        Past Medical History:  Diagnosis Date  . Anemia   . Anxiety   . Arthritis   . CAD (coronary artery disease)   . Cancer ([place])    skin  . Cardiomyopathy ([place])   . CHF (congestive heart failure) ([place])   . Depression   . IBS (irritable bowel syndrome) 2010  . Kidney failure July 2012   Hemodialysis 3xweek  . Kidney failure   . Meniere disease   . Meniere's disease   . Myocardial infarction ([place])   . OSA (obstructive sleep apnea)    CPAP  . Peritoneal dialysis status ([place])   . Presence of IVC filter 2019  . Presence of permanent cardiac pacemaker   . Renal insufficiency     Past Surgical History:  Procedure Laterality Date  . [place]  . ABDOMINAL HYSTERECTOMY    . ANUS  SURGERY  2010  . AV FISTULA PLACEMENT Right 04/15/2016   Procedure: ARTERIOVENOUS (AV) FISTULA CREATION ( RADIOCEPHALIC ) STAGE 2;  Surgeon: Algernon Huxley, MD;  Location: [facility];  Service: Vascular;  Laterality: Right;  . CATARACT EXTRACTION  2006, 2011  . CHOLECYSTECTOMY  01/2008  . DIALYSIS/PERMA CATHETER INSERTION N/A 01/07/2017   Procedure: DIALYSIS/PERMA CATHETER INSERTION With an IVC filter placement;  Surgeon: Algernon Huxley, MD;  Location: [place] [facility];  Service: Cardiovascular;  Laterality: N/A;  . DIALYSIS/PERMA CATHETER REMOVAL N/A 08/20/2016   Procedure: Dialysis/Perma Catheter Removal;  Surgeon: Algernon Huxley, MD;  Location: [place] [facility];  Service: Cardiovascular;  Laterality: N/A;  . DIALYSIS/PERMA CATHETER REMOVAL N/A 08/24/2017   Procedure: DIALYSIS/PERMA CATHETER REMOVAL;  Surgeon: Katha Cabal, MD;  Location: [place] [facility];  Service: Cardiovascular;  Laterality: N/A;  . EYE SURGERY     cataracts bilateral  . FEMUR IM NAIL Left 12/15/2015   Procedure: INTRAMEDULLARY (IM) RETROGRADE FEMORAL NAILING;  Surgeon: Oletta Cohn, DO;  Location: [facility];  Service: Orthopedics;  Laterality: Left;  . HIP PINNING,CANNULATED Left 05/10/2016   Procedure: CANNULATED HIP PINNING;  Surgeon: Earnestine Leys, MD;  Location: [facility];  Service: Orthopedics;  Laterality: Left;  . INSERT / REPLACE / REMOVE PACEMAKER    . INSERTION OF DIALYSIS CATHETER  07/2010  . IVC FILTER INSERTION  2019  . JOINT REPLACEMENT     left knee  . MINOR REMOVAL OF PERITONEAL DIALYSIS CATHETER  04/15/2016   Procedure: MINOR REMOVAL OF PERITONEAL DIALYSIS CATHETER;  Surgeon: Algernon Huxley, MD;  Location: [facility];  Service: Vascular;;  . ORIF CLAVICULAR FRACTURE Right 02/16/2017   Procedure: OPEN REDUCTION INTERNAL FIXATION (ORIF) CLAVICULAR FRACTURE;  Surgeon: Corky Mull, MD;  Location: [facility];  Service: Orthopedics;  Laterality: Right;  . PACEMAKER INSERTION  2006  . PERIPHERAL VASCULAR CATHETERIZATION N/A 12/18/2015   Procedure: Dialysis/Perma Catheter Insertion;  Surgeon: Katha Cabal, MD;  Location: [place] [facility];  Service: Cardiovascular;  Laterality: N/A;  . TOTAL KNEE ARTHROPLASTY  2008   LEFT/Dr [place]    Social History Social History   Tobacco Use  . Smoking status: Former Smoker    Last attempt to quit: 03/31/1948    Years since quitting: 69.4  . Smokeless tobacco: Never Used  Substance Use Topics  . Alcohol use: No  . Drug use: No    Family History Family History  Problem Relation Age of Onset  . Cancer Mother        ? ovarian - sarcoma  . Cancer Father        Skin cancer  . Diabetes Sister   . COPD Sister   . Depression Sister   . Cancer Sister        Lung - 9 yrs old    Allergies  Allergen Reactions  . Statins Other (See Comments)    Muscle weakness severe  . Codeine Sulfate Nausea And  Vomiting  . Codeine Other (See Comments)    GI UPSET  . Effexor [Venlafaxine] Nausea Only  . Ezetimibe-Simvastatin Other (See Comments)    Muscle weakness     REVIEW OF SYSTEMS (Negative unless checked)  Constitutional: [] Weight loss  [] Fever  [] Chills Cardiac: [] Chest pain   [] Chest pressure   [] Palpitations   [] Shortness of breath when laying flat   [x] Shortness of breath at rest   [x] Shortness of breath with exertion. Vascular:  [] Pain in legs with walking   []   Pain in legs at rest   [] Pain in legs when laying flat   [] Claudication   [] Pain in feet when walking  [] Pain in feet at rest  [] Pain in feet when laying flat   [] History of DVT   [] Phlebitis   [x] Swelling in legs   [] Varicose veins   [x] Non-healing ulcers Pulmonary:   [] Uses home oxygen   [] Productive cough   [] Hemoptysis   [] Wheeze  [] COPD   [] Asthma Neurologic:  [x] Dizziness  [] Blackouts   [] Seizures   [] History of stroke   [] History of TIA  [] Aphasia   [] Temporary blindness   [] Dysphagia   [] Weakness or numbness in arms   [] Weakness or numbness in legs Musculoskeletal:  [x] Arthritis   [] Joint swelling   [] Joint pain   [] Low back pain Hematologic:  [] Easy bruising  [] Easy bleeding   [] Hypercoagulable state   [x] Anemic  [] Hepatitis Gastrointestinal:  [] Blood in stool   [] Vomiting blood  [] Gastroesophageal reflux/heartburn   [] Difficulty swallowing. Genitourinary:  [x] Chronic kidney disease   [] Difficult urination  [] Frequent urination  [] Burning with urination   [] Blood in urine Skin:  [] Rashes   [x] Ulcers   [x] Wounds Psychological:  [x] History of anxiety   []  History of major depression.  Physical Examination  Vitals:   08/30/17 1504 08/30/17 1702 08/30/17 1716 08/30/17 1730  BP: (!) 142/67 (!) 142/78 131/68 133/69  Pulse: 91 85 84 86  Resp: 20 (!) 21 (!) 21 (!) 23  Temp: 97.7 F (36.5 C) (!) 96.2 F (35.7 C)    TempSrc: Oral Axillary    SpO2: (!) 89% 98% 100% 100%  Weight:  111 lb 5.3 oz (50.5 kg)    Height:        Body mass index is 19.11 kg/m. Gen: Frail and debilitated elderly female with some tachypnea Head: [place]/AT, + temporalis wasting.  Ear/Nose/Throat: Hearing grossly intact, nares w/o erythema or drainage, oropharynx w/o Erythema/Exudate Eyes: Sclera non-icteric, conjunctiva clear Neck: Trachea midline.  No JVD.  Pulmonary:  Good air movement, respirations somewhat labored on supplemental oxygen Cardiac: Irregular Vascular: Excellent thrill and right arm AV fistula Vessel Right Left  Radial Palpable Palpable                          PT  1+ palpable Palpable  DP Palpable Palpable   Gastrointestinal: soft, non-tender/non-distended.  Musculoskeletal: M/S 5/5 throughout.  Venous stasis changes are present bilaterally a little worse on the right and the left.  1+ right lower extremity edema.  2 superficial wounds as described above on the right leg with mild surrounding erythema.  Excellent capillary refill in both lower extremities Neurologic: Sensation grossly intact in extremities.  Symmetrical.  Speech is fluent. Motor exam as listed above. Psychiatric: Judgment intact, Mood & affect appropriate for pt's clinical situation. Dermatologic: Right leg wounds as above      CBC Lab Results  Component Value Date   WBC 6.7 08/30/2017   HGB 11.5 (L) 08/30/2017   HCT 34.6 (L) 08/30/2017   MCV 97.2 08/30/2017   PLT 186 08/30/2017    BMET    Component Value Date/Time   NA 138 08/30/2017 1222   NA 142 04/24/2013 1557   K 3.8 08/30/2017 1222   K 3.8 04/24/2013 1557   CL 96 (L) 08/30/2017 1222   CL 106 04/24/2013 1557   CO2 27 08/30/2017 1222   CO2 32 04/24/2013 1557   GLUCOSE 117 (H) 08/30/2017 1222   GLUCOSE 109 (H) 04/24/2013  1557   BUN 64 (H) 08/30/2017 1222   BUN 40 (A) 09/03/2014   BUN 30 (H) 04/24/2013 1557   CREATININE 5.18 (H) 08/30/2017 1222   CREATININE 2.68 (H) 04/24/2013 1557   CALCIUM 8.9 08/30/2017 1222   CALCIUM 8.7 04/24/2013 1557   GFRNONAA 7 (L)  08/30/2017 1222   GFRNONAA 16 (L) 04/24/2013 1557   GFRAA 8 (L) 08/30/2017 1222   GFRAA 18 (L) 04/24/2013 1557   Estimated Creatinine Clearance: 6 mL/min (A) (by C-G formula based on SCr of 5.18 mg/dL (H)).  COAG Lab Results  Component Value Date   INR 1.12 02/03/2017   INR 1.39 01/01/2017   INR 1.14 11/10/2016    Radiology No results found.    Assessment/Plan 1.  Right leg wounds with some cellulitis.  This was done with a fall and her very thin skin and has 2 significant skin tears.  The redness is improved.  There is some swelling.  She has had both arterial and venous studies done within the past several months which were normal and she has a strongly palpable pedal pulse.  I would elevate her legs, treat her with 2 weeks of outpatient oral antibiotics, and consider a compression wrap such as an Unna boot to get her swelling down her wounds healed as an outpatient.  No vascular intervention required at this point 2.  End-stage renal disease.  Short of breath now.  Going for dialysis tonight.  Her access is working well.  This will impair her wound healing. 3.  Cardiomyopathy.  This may explain some of her lower extremity discoloration as well.  Wound healing may be poor due to poor perfusion pressure from her heart, but her vascular status is intact.   Leotis Pain, MD  08/30/2017 5:46 PM    This note was created with Dragon medical transcription system.  Any error is purely unintentional

## 2017-08-30 NOTE — Progress Notes (Signed)
[facility]  ROUNDING NOTE   Subjective:   [name] admitted to [facility] on 08/30/2017 for Wound infection [T14.8XXA, L08.9]  Hemodialysis for today.   Complains of shortness of breath. 4L [place]  Objective:  Vital signs in last 24 hours:  Temp:  [97.7 F (36.5 C)-98 F (36.7 C)] 97.7 F (36.5 C) (08/05 1504) Pulse Rate:  [86-91] 91 (08/05 1504) Resp:  [19-21] 20 (08/05 1504) BP: (126-142)/(67-79) 142/67 (08/05 1504) SpO2:  [89 %-100 %] 89 % (08/05 1504) Weight:  [45.4 kg (100 lb)] 45.4 kg (100 lb) (08/05 1201)  Weight change:  Filed Weights   08/30/17 1201  Weight: 45.4 kg (100 lb)    Intake/Output: No intake/output data recorded.   Intake/Output this shift:  Total I/O In: 250 [IV Piggyback:250] Out: -   Physical Exam: General: NAD,   Head: Normocephalic, atraumatic. Moist oral mucosal membranes  Eyes: Anicteric, PERRL  Neck: Supple, trachea midline  Lungs:  Bilateral crackles  Heart: Regular rate and rhythm  Abdomen:  Soft, nontender  Extremities:  no  peripheral edema.  Neurologic: Nonfocal, moving all four extremities  Skin: +lacerations  Access: Left AVF    Basic Metabolic Panel: Recent Labs  Lab 08/30/17 1222  NA 138  K 3.8  CL 96*  CO2 27  GLUCOSE 117*  BUN 64*  CREATININE 5.18*  CALCIUM 8.9    Liver Function Tests: Recent Labs  Lab 08/30/17 1222  AST 28  ALT 16  ALKPHOS 113  BILITOT 1.2  PROT 7.6  ALBUMIN 3.7   No results for input(s): LIPASE, AMYLASE in the last 168 hours. No results for input(s): AMMONIA in the last 168 hours.  CBC: Recent Labs  Lab 08/30/17 1222  WBC 6.7  NEUTROABS 5.2  HGB 11.5*  HCT 34.6*  MCV 97.2  PLT 186    Cardiac Enzymes: No results for input(s): CKTOTAL, CKMB, CKMBINDEX, TROPONINI in the last 168 hours.  BNP: Invalid input(s): POCBNP  CBG: No results for input(s): GLUCAP in the last 168 hours.  Microbiology: Results for orders placed or performed during the hospital  encounter of 06/28/17  MRSA PCR Screening     Status: None   Collection Time: 06/28/17  1:58 PM  Result Value Ref Range Status   MRSA by PCR NEGATIVE NEGATIVE Final    Comment:        The GeneXpert MRSA Assay (FDA approved for NASAL specimens only), is one component of a comprehensive MRSA colonization surveillance program. It is not intended to diagnose MRSA infection nor to guide or monitor treatment for MRSA infections. Performed at [facility], [address] [place], [place] [address]     Coagulation Studies: No results for input(s): LABPROT, INR in the last 72 hours.  Urinalysis: No results for input(s): COLORURINE, LABSPEC, PHURINE, GLUCOSEU, HGBUR, BILIRUBINUR, KETONESUR, PROTEINUR, UROBILINOGEN, NITRITE, LEUKOCYTESUR in the last 72 hours.  Invalid input(s): APPERANCEUR    Imaging: No results found.   Medications:   . cefTRIAXone (ROCEPHIN)  IV     . heparin  5,000 Units Subcutaneous Q12H   acetaminophen **OR** acetaminophen, HYDROcodone-acetaminophen, ondansetron **OR** ondansetron (ZOFRAN) IV  Assessment/ Plan:  [name] is a 84 y.o. white female with end stage renal disease on hemodialysis, coronary artery disease, pacemaker placement, meniere's disease, congestive heart failure, peripheral vascular disease, multiple falls and fractures.   MWF [facility] permcath/left AVF EDW 46.5kg.   1. End-stage renal disease: tolerated treatment well yesterday. Using  LIJ permcath  2. Anemia of chronic kidney disease: hemoglobin 11.5 - epo as outpatient.   3. Secondary hyperparathyroidism: outpatient labs: 7/8: PTH 1349, calcium 8.5, phosphorus 5.8 - sevelamer with meals  4. Hypertension: blood pressure is low during dialysis treatments.  - metoprolol   LOS: 0 Sharon [name] 8/5/20194:11 PM

## 2017-08-31 LAB — HEPATITIS B SURFACE ANTIGEN: Hepatitis B Surface Ag: NEGATIVE

## 2017-08-31 LAB — GLUCOSE, CAPILLARY: GLUCOSE-CAPILLARY: 80 mg/dL (ref 70–99)

## 2017-08-31 LAB — HEPATITIS B SURFACE ANTIBODY, QUANTITATIVE: HEPATITIS B-POST: 10 m[IU]/mL

## 2017-08-31 MED ORDER — LEVOTHYROXINE SODIUM 50 MCG PO TABS
25.0000 ug | ORAL_TABLET | Freq: Every day | ORAL | Status: DC
  Administered 2017-08-31 – 2017-09-07 (×7): 25 ug via ORAL
  Filled 2017-08-31 (×7): qty 1

## 2017-08-31 MED ORDER — HYDROXYZINE HCL 25 MG PO TABS
12.5000 mg | ORAL_TABLET | Freq: Three times a day (TID) | ORAL | Status: DC | PRN
  Administered 2017-08-31 – 2017-09-02 (×5): 12.5 mg via ORAL
  Filled 2017-08-31 (×5): qty 1

## 2017-08-31 MED ORDER — RENA-VITE PO TABS
1.0000 | ORAL_TABLET | Freq: Every day | ORAL | Status: DC
  Administered 2017-08-31 – 2017-09-06 (×4): 1 via ORAL
  Filled 2017-08-31 (×5): qty 1

## 2017-08-31 MED ORDER — SEVELAMER CARBONATE 800 MG PO TABS
800.0000 mg | ORAL_TABLET | Freq: Three times a day (TID) | ORAL | Status: DC
  Administered 2017-08-31 – 2017-09-05 (×7): 800 mg via ORAL
  Filled 2017-08-31 (×9): qty 1

## 2017-08-31 MED ORDER — VITAMIN C 500 MG PO TABS
250.0000 mg | ORAL_TABLET | Freq: Two times a day (BID) | ORAL | Status: DC
  Administered 2017-08-31 – 2017-09-07 (×12): 250 mg via ORAL
  Filled 2017-08-31 (×16): qty 0.5

## 2017-08-31 MED ORDER — MUPIROCIN 2 % EX OINT
1.0000 "application " | TOPICAL_OINTMENT | Freq: Two times a day (BID) | CUTANEOUS | Status: AC
  Administered 2017-08-31 – 2017-09-03 (×7): 1 via NASAL
  Filled 2017-08-31: qty 22

## 2017-08-31 MED ORDER — PRO-STAT SUGAR FREE PO LIQD
30.0000 mL | Freq: Two times a day (BID) | ORAL | Status: DC
  Administered 2017-09-01 – 2017-09-06 (×5): 30 mL via ORAL

## 2017-08-31 MED ORDER — CHLORHEXIDINE GLUCONATE CLOTH 2 % EX PADS
6.0000 | MEDICATED_PAD | Freq: Every day | CUTANEOUS | Status: AC
  Administered 2017-09-02 – 2017-09-04 (×3): 6 via TOPICAL

## 2017-08-31 MED ORDER — BOOST / RESOURCE BREEZE PO LIQD CUSTOM
1.0000 | Freq: Three times a day (TID) | ORAL | Status: DC
  Administered 2017-08-31 – 2017-09-06 (×7): 1 via ORAL

## 2017-08-31 MED ORDER — OCUVITE-LUTEIN PO CAPS
1.0000 | ORAL_CAPSULE | Freq: Every day | ORAL | Status: DC
  Administered 2017-08-31 – 2017-09-05 (×5): 1 via ORAL
  Filled 2017-08-31 (×8): qty 1

## 2017-08-31 MED ORDER — METOPROLOL SUCCINATE ER 25 MG PO TB24
25.0000 mg | ORAL_TABLET | Freq: Every day | ORAL | Status: DC
  Administered 2017-09-02 – 2017-09-05 (×3): 25 mg via ORAL
  Filled 2017-08-31 (×3): qty 1

## 2017-08-31 MED ORDER — LATANOPROST 0.005 % OP SOLN
1.0000 [drp] | Freq: Every day | OPHTHALMIC | Status: DC
  Administered 2017-09-01 – 2017-09-06 (×3): 1 [drp] via OPHTHALMIC
  Filled 2017-08-31: qty 2.5

## 2017-08-31 NOTE — Progress Notes (Signed)
[facility] [name]    MR#:  [id number]  DATE OF BIRTH:  1926/02/22  SUBJECTIVE:  CHIEF COMPLAINT: Patient leg pain is better  REVIEW OF SYSTEMS:  CONSTITUTIONAL: No fever, fatigue or weakness.  EYES: No blurred or double vision.  EARS, NOSE, AND THROAT: No tinnitus or ear pain.  RESPIRATORY: No cough, shortness of breath, wheezing or hemoptysis.  CARDIOVASCULAR: No chest pain, orthopnea, edema.  GASTROINTESTINAL: No nausea, vomiting, diarrhea or abdominal pain.  GENITOURINARY: No dysuria, hematuria.  ENDOCRINE: No polyuria, nocturia,  HEMATOLOGY: No anemia, easy bruising or bleeding SKIN: Right anterior leg healing laceration, leg swelling/redness  MUSCULOSKELETAL: No joint pain or arthritis.   NEUROLOGIC: No tingling, numbness, weakness.  PSYCHIATRY: No anxiety or depression.   DRUG ALLERGIES:   Allergies  Allergen Reactions  . Statins Other (See Comments)    Muscle weakness severe  . Codeine Sulfate Nausea And Vomiting  . Codeine Other (See Comments)    GI UPSET  . Effexor [Venlafaxine] Nausea Only  . Ezetimibe-Simvastatin Other (See Comments)    Muscle weakness    VITALS:  Blood pressure 123/67, pulse 76, temperature (!) 96.8 F (36 C), temperature source Axillary, resp. rate 20, height 5\' 4"  (1.626 m), weight 49.2 kg (108 lb 7.5 oz), SpO2 93 %.  PHYSICAL EXAMINATION:  GENERAL:  84 y.o.-year-old patient lying in the bed with no acute distress.  EYES: Pupils equal, round, reactive to light and accommodation. No scleral icterus. Extraocular muscles intact.  HEENT: Head atraumatic, normocephalic. Oropharynx and nasopharynx clear.  NECK:  Supple, no jugular venous distention. No thyroid enlargement, no tenderness.  LUNGS: Normal breath sounds bilaterally, no wheezing, rales,rhonchi or crepitation. No use of accessory muscles of respiration.  CARDIOVASCULAR: S1, S2 normal. No murmurs, rubs, or  gallops.  ABDOMEN: Soft, nontender, nondistended. Bowel sounds present. No organomegaly or mass.  EXTREMITIES: No pedal edema, cyanosis, or clubbing.  NEUROLOGIC: Cranial nerves II through XII are intact. Muscle strength 5/5 in all extremities. Sensation intact. Gait not checked.  PSYCHIATRIC: The patient is alert and oriented x 3.  SKIN:  Right leg healing laceration, with acute erythema of the foot, bilateral lower extremity dusky, cold to touch peripheral pulses 1-2+      LABORATORY PANEL:   CBC Recent Labs  Lab 08/30/17 1222  WBC 6.7  HGB 11.5*  HCT 34.6*  PLT 186   ------------------------------------------------------------------------------------------------------------------  Chemistries  Recent Labs  Lab 08/30/17 1222  NA 138  K 3.8  CL 96*  CO2 27  GLUCOSE 117*  BUN 64*  CREATININE 5.18*  CALCIUM 8.9  AST 28  ALT 16  ALKPHOS 113  BILITOT 1.2   ------------------------------------------------------------------------------------------------------------------  Cardiac Enzymes No results for input(s): TROPONINI in the last 168 hours. ------------------------------------------------------------------------------------------------------------------  RADIOLOGY:  No results found.  EKG:   Orders placed or performed during the hospital encounter of 06/28/17  . ED EKG  . ED EKG  . EKG    ASSESSMENT AND PLAN:    84 year old female with past medical history of ESRD - HD M/W/F, OSA, dementia, history of recurrent falls, CAD, ICM w/ EF 40-98%, chronic systolic CHF, secondary hyperparathyroidism who presents to the hospital with right lower extremity cellulitis   *Acute right lower extremity cellulitis  -Blood cultures are negative so far.  MRSA by PCR is positive Secondary to acute right leg laceration, noted history of frequent falling events Admit to regular nursing floor bed, cellulitis protocol, IV Rocephin  for now  *Chronic end-stage renal  disease on hemodialysis M/W/F Nephrology consulted for hemodialysis needs as patient is due for hemodialysis today  *Acute bilateral lower extremities peripheral vascular disease and chronic venous changes Consult vascular surgery seen by Dr.   Lucky Cowboy, patient has had both arterial and venous studies done within the past several months which were normal and she has a strong palpable pulses vascular is recommending to continue 2 weeks of outpatient oral antibiotics and compression wraps such as Unna boot Not recommending any vascular interventions at this time  *Chronic diabetes mellitus type 2 Sliding scale insulin with Accu-Cheks per routine   *Chronic benign essential hypertension  Stable  Continue home regiment   *Chronic systolic congestive heart failure with cardiomyopathy ejection fraction 20-25% Mild exacerbation due to end-stage renal disease in need of hemodialysis Continue metoprolol, supplemental oxygen wean as tolerated  *Chronic hypothyroidism  Stable  continue synthroid  *Chronic secondary hyperparathyroidism continue Renvela.     All the records are reviewed and case discussed with Care Management/Social Workerr. Management plans discussed with the patient, family and they are in agreement.  CODE STATUS: Partial code  TOTAL TIME TAKING CARE OF THIS PATIENT: 64minutes.   POSSIBLE D/C IN 1-2DAYS, DEPENDING ON CLINICAL CONDITION.  Note: This dictation was prepared with Dragon dictation along with smaller phrase technology. Any transcriptional errors that result from this process are unintentional.   Nicholes Mango M.D on 08/31/2017 at 3:50 PM  Between 7am to 6pm - Pager - [phone] After 6pm go to [email or web address] - password [email or web address] [facility]  [facility]  Office  [phone]  CC: Primary care physician; Kirk Ruths, MD

## 2017-08-31 NOTE — Care Management (Signed)
Amanda Morris dialysis liaison notified of admission.    

## 2017-08-31 NOTE — Progress Notes (Signed)
[facility]  ROUNDING NOTE   Subjective:   Hemodialysis treatment last night. UF of 1437mL.   3 L [facility]  Patient continues to have confusion and complains of shortness of breath.   Objective:  Vital signs in last 24 hours:  Temp:  [96.2 F (35.7 C)-98 F (36.7 C)] 96.8 F (36 C) (08/06 0647) Pulse Rate:  [76-91] 76 (08/06 0647) Resp:  [14-26] 20 (08/06 0647) BP: (120-142)/(56-80) 123/67 (08/06 0647) SpO2:  [86 %-100 %] 93 % (08/06 0647) Weight:  [45.4 kg (100 lb)-50.5 kg (111 lb 5.3 oz)] 49.2 kg (108 lb 7.5 oz) (08/05 2037)  Weight change:  Filed Weights   08/30/17 1201 08/30/17 1702 08/30/17 2037  Weight: 45.4 kg (100 lb) 50.5 kg (111 lb 5.3 oz) 49.2 kg (108 lb 7.5 oz)    Intake/Output: I/O last 3 completed shifts: In: 430 [P.O.:80; IV Piggyback:350] Out: 7017 [BLTJQ:3009]   Intake/Output this shift:  No intake/output data recorded.  Physical Exam: General: NAD, laying in bed  Head: Normocephalic, atraumatic. Moist oral mucosal membranes  Eyes: Anicteric, PERRL  Neck: Supple, trachea midline  Lungs:  Bilateral basilar crackles  Heart: Regular rate and rhythm  Abdomen:  Soft, nontender  Extremities:  no peripheral edema.  Neurologic: Nonfocal, moving all four extremities  Skin: +lacerations  Access: Left AVF    Basic Metabolic Panel: Recent Labs  Lab 08/30/17 1222  NA 138  K 3.8  CL 96*  CO2 27  GLUCOSE 117*  BUN 64*  CREATININE 5.18*  CALCIUM 8.9    Liver Function Tests: Recent Labs  Lab 08/30/17 1222  AST 28  ALT 16  ALKPHOS 113  BILITOT 1.2  PROT 7.6  ALBUMIN 3.7   No results for input(s): LIPASE, AMYLASE in the last 168 hours. No results for input(s): AMMONIA in the last 168 hours.  CBC: Recent Labs  Lab 08/30/17 1222  WBC 6.7  NEUTROABS 5.2  HGB 11.5*  HCT 34.6*  MCV 97.2  PLT 186    Cardiac Enzymes: No results for input(s): CKTOTAL, CKMB, CKMBINDEX, TROPONINI in the last 168 hours.  BNP: Invalid input(s):  POCBNP  CBG: Recent Labs  Lab 08/31/17 0859  GLUCAP 80    Microbiology: Results for orders placed or performed during the hospital encounter of 08/30/17  Blood culture (routine x 2)     Status: None (Preliminary result)   Collection Time: 08/30/17 12:22 PM  Result Value Ref Range Status   Specimen Description BLOOD BLOOD LEFT WRIST  Final   Special Requests   Final    BOTTLES DRAWN AEROBIC AND ANAEROBIC Blood Culture adequate volume   Culture   Final    NO GROWTH < 24 HOURS Performed at [facility], [address], [place], [place] [address]    Report Status PENDING  Incomplete  Blood culture (routine x 2)     Status: None (Preliminary result)   Collection Time: 08/30/17 12:27 PM  Result Value Ref Range Status   Specimen Description BLOOD BLOOD LEFT FOREARM  Final   Special Requests   Final    BOTTLES DRAWN AEROBIC AND ANAEROBIC Blood Culture adequate volume   Culture   Final    NO GROWTH < 24 HOURS Performed at [facility], [address], [place], [place] [address]    Report Status PENDING  Incomplete  MRSA PCR Screening     Status: Abnormal   Collection Time: 08/30/17  9:21 PM  Result Value Ref Range Status  MRSA by PCR POSITIVE (A) NEGATIVE Final    Comment:        The GeneXpert MRSA Assay (FDA approved for NASAL specimens only), is one component of a comprehensive MRSA colonization surveillance program. It is not intended to diagnose MRSA infection nor to guide or monitor treatment for MRSA infections. C/Sharon [name] @2330  08/30/17 FLC Performed at [facility], [address] [place], [place] [address]     Coagulation Studies: No results for input(s): LABPROT, INR in the last 72 hours.  Urinalysis: No results for input(s): COLORURINE, LABSPEC, PHURINE, GLUCOSEU, HGBUR, BILIRUBINUR, KETONESUR, PROTEINUR, UROBILINOGEN, NITRITE, LEUKOCYTESUR in the last 72 hours.  Invalid input(s): APPERANCEUR    Imaging: No results  found.   Medications:   . cefTRIAXone (ROCEPHIN)  IV Stopped (08/30/17 2306)   . Chlorhexidine Gluconate Cloth  6 each Topical Q0600  . Chlorhexidine Gluconate Cloth  6 each Topical Q0600  . heparin  5,000 Units Subcutaneous Q12H  . mupirocin ointment  1 application Nasal BID   acetaminophen **OR** acetaminophen, HYDROcodone-acetaminophen, ondansetron **OR** ondansetron (ZOFRAN) IV  Assessment/ Plan:  Sharon [name] is a 84 y.o. white female with end stage renal disease on hemodialysis, coronary artery disease, pacemaker placement, meniere's disease, congestive heart failure, peripheral vascular disease, multiple falls and fractures.   MWF [facility] permcath/left AVF EDW 46.5kg.   1. End-stage renal disease: tolerated treatment well yesterday. UF 1422mL Suspect patient is still volume overload. Most likely has an estimated dry weight of 42kg.   2. Anemia of chronic kidney disease: hemoglobin 11.5 - epo as outpatient.   3. Secondary hyperparathyroidism with hyperphosphatemia: PTH not well controlled outpatient labs: 7/8: PTH 1349, calcium 8.5, phosphorus 5.8 - sevelamer with meals  4. Hypertension: blood pressure is low during dialysis treatments.  - metoprolol   LOS: 1 Jamia Hoban 8/6/20199:54 AM

## 2017-08-31 NOTE — Progress Notes (Signed)
Pt MRSA PCR positive. MD notified and orders placed.

## 2017-08-31 NOTE — Progress Notes (Signed)
Initial Nutrition Assessment  DOCUMENTATION CODES:   Severe malnutrition in context of chronic illness  INTERVENTION:   Prostat liquid protein PO 30 ml TID with meals, each supplement provides 100 kcal, 15 grams protein.  Boost Breeze po TID, each supplement provides 250 kcal and 9 grams of protein  Rena-vite daily  Ocuvite daily for wound healing (provides zinc, vitamin A, vitamin C, Vitamin E, copper, and selenium)  Vitamin C 250mg  po BID  Dysphagia 3 diet   NUTRITION DIAGNOSIS:   Severe Malnutrition related to chronic illness(ESRD on HD) as evidenced by severe fat depletion, severe muscle depletion.  GOAL:   Patient will meet greater than or equal to 90% of their needs  MONITOR:   PO intake, Supplement acceptance, Labs, Skin, Weight trends, I & O's  REASON FOR ASSESSMENT:   Other (Comment)(low BMI)    ASSESSMENT:   84 y.o. white female with end stage renal disease on hemodialysis, coronary artery disease, pacemaker placement, meniere's disease, congestive heart failure, peripheral vascular disease, multiple falls and fractures.    Visited pt's room today. RD familiar with this pt from multiple previous admits. Pt with chronic poor appetite and oral intake at baseline. Pt also with chronic suspected scurvy. Pt is lactose intolerant and does not drink "milky supplements" but she will drink Prostat and Boost Breeze if poured over ice. Pt previously on a dysphagia 2 diet; will change diet order to dysphagia 3 and downgrade as needed. Pt does not do well on a renal diet. Per chart, pt with weight gain since her last admit in June. Suspect some weight gain r/t fluid changes and edema. RD will add supplements and vitamins to help pt meet her estimated needs and replace losses from HD. Pt will need to continue vitamin C after discharge. Pt continues to have ecchymosis and wounds; suspect pt has not been taking her vitamin C at the SNF.    Medications reviewed and include:  heparin, ceftriaxone   Labs reviewed: Cl 96(L), BUN 64(H), creat 5.18(H)  NUTRITION - FOCUSED PHYSICAL EXAM:    Most Recent Value  Orbital Region  Severe depletion  Upper Arm Region  Severe depletion  Thoracic and Lumbar Region  Severe depletion  Buccal Region  Severe depletion  Temple Region  Severe depletion  Clavicle Bone Region  Severe depletion  Clavicle and Acromion Bone Region  Severe depletion  Scapular Bone Region  Severe depletion  Dorsal Hand  Severe depletion  Patellar Region  Severe depletion  Anterior Thigh Region  Severe depletion  Posterior Calf Region  Severe depletion  Edema (RD Assessment)  Moderate [RLE]  Hair  Reviewed  Eyes  Reviewed  Mouth  Reviewed  Skin  Reviewed  Nails  Reviewed     Diet Order:   Diet Order           DIET DYS 3 Room service appropriate? Yes; Fluid consistency: Thin  Diet effective now         EDUCATION NEEDS:   Not appropriate for education at this time  Skin:  Skin Assessment: Reviewed RN Assessment(RLE wound, ecchymosis )  Last BM:  unknown   Height:   Ht Readings from Last 1 Encounters:  08/30/17 5\' 4"  (1.626 m)    Weight:   Wt Readings from Last 1 Encounters:  08/30/17 108 lb 7.5 oz (49.2 kg)    Ideal Body Weight:  54.5 kg  BMI:  Body mass index is 18.62 kg/m.  Estimated Nutritional Needs:   Kcal:  1200-1500kcal/day  Protein:  78-88g/day   Fluid:  1.2L/day   Koleen Distance MS, RD, LDN Pager #- [phone] Office#- [phone] After Hours Pager: [phone]

## 2017-09-01 LAB — CBC
HEMATOCRIT: 35.7 % (ref 35.0–47.0)
Hemoglobin: 11.7 g/dL — ABNORMAL LOW (ref 12.0–16.0)
MCH: 31.6 pg (ref 26.0–34.0)
MCHC: 32.8 g/dL (ref 32.0–36.0)
MCV: 96.5 fL (ref 80.0–100.0)
Platelets: 209 10*3/uL (ref 150–440)
RBC: 3.7 MIL/uL — ABNORMAL LOW (ref 3.80–5.20)
RDW: 15.8 % — AB (ref 11.5–14.5)
WBC: 6.1 10*3/uL (ref 3.6–11.0)

## 2017-09-01 LAB — RENAL FUNCTION PANEL
ALBUMIN: 3.5 g/dL (ref 3.5–5.0)
Anion gap: 15 (ref 5–15)
BUN: 60 mg/dL — AB (ref 8–23)
CALCIUM: 8.7 mg/dL — AB (ref 8.9–10.3)
CO2: 24 mmol/L (ref 22–32)
CREATININE: 4.8 mg/dL — AB (ref 0.44–1.00)
Chloride: 99 mmol/L (ref 98–111)
GFR calc Af Amer: 8 mL/min — ABNORMAL LOW (ref >60)
GFR calc non Af Amer: 7 mL/min — ABNORMAL LOW (ref >60)
GLUCOSE: 218 mg/dL — AB (ref 70–99)
PHOSPHORUS: 6.5 mg/dL — AB (ref 2.5–4.6)
Potassium: 4.1 mmol/L (ref 3.5–5.1)
SODIUM: 138 mmol/L (ref 135–145)

## 2017-09-01 NOTE — Progress Notes (Signed)
Pre HD assessment    09/01/17 0954  Neurological  Level of Consciousness Alert  Orientation Level Oriented X4  Respiratory  Respiratory Pattern Regular (intermittent tachypnea )  Chest Assessment Chest expansion symmetrical  Cardiac  ECG Monitor Yes  Antiarrhythmic device Yes  Antiarrhythmic device  Antiarrhythmic device Permanent Pacemaker  Vascular  R Radial Pulse +2  L Radial Pulse +2  Integumentary  Integumentary (WDL) X  Skin Color Appropriate for ethnicity;Red  Musculoskeletal  Musculoskeletal (WDL) X  Generalized Weakness Yes  Assistive Device None  GU Assessment  Genitourinary (WDL) X  Genitourinary Symptoms  (HD)  Psychosocial  Psychosocial (WDL) WDL

## 2017-09-01 NOTE — Progress Notes (Signed)
Pre HD assessment   09/01/17 0953  Vital Signs  Temp (!) 97.5 F (36.4 C)  Temp Source Axillary  Pulse Rate 77  Pulse Rate Source Monitor  Resp 14  BP 127/73  BP Location Left Arm  BP Method Automatic  Patient Position (if appropriate) Sitting  Oxygen Therapy  SpO2 100 %  O2 Device Nasal Cannula  O2 Flow Rate (L/min) 3 L/min  Pain Assessment  Pain Scale 0-10  Pain Score Asleep  Dialysis Weight  Weight 50.5 kg (111 lb 5.3 oz)  Type of Weight Pre-Dialysis  Time-Out for Hemodialysis  What Procedure? HD  Pt Identifiers(min of two) First/Last Name;MRN/Account#  Correct Site? Yes  Correct Side? Yes  Correct Procedure? Yes  Consents Verified? Yes  Rad Studies Available? N/A  Safety Precautions Reviewed? Yes  Engineer, civil (consulting) Number  (3A)  Station Number 4  UF/Alarm Test Passed  Conductivity: Meter 14  Conductivity: Machine  13.9  pH 7.4  Reverse Osmosis main  Normal Saline Lot Number [id number]  Dialyzer Lot Number [id number]  Disposable Set Lot Number [id number]  Machine Temperature 96.8 F (36 C)  Musician and Audible Yes  Blood Lines Intact and Secured Yes  Pre Treatment Patient Checks  Vascular access used during treatment Fistula  Hepatitis B Surface Antigen Results Negative  Date Hepatitis B Surface Antigen Drawn 08/30/17  Isolation Initiated Yes (MRSA /Contact )  Hepatitis B Surface Antibody  (>10)  Date Hepatitis B Surface Antibody Drawn 08/30/17  Hemodialysis Consent Verified Yes  Hemodialysis Standing Orders Initiated Yes  ECG (Telemetry) Monitor On Yes  Prime Ordered Normal Saline  Length of  DialysisTreatment -hour(s) 3 Hour(s)  Dialyzer Elisio 17H NR  Dialysate 3K, 2.5 Ca  Dialysis Anticoagulant None  Dialysate Flow Ordered 600  Blood Flow Rate Ordered 400 mL/min  Ultrafiltration Goal 1.5 Liters  Pre Treatment Labs Renal panel;CBC  Dialysis Blood Pressure Support Ordered Normal Saline  Education / Care Plan  Dialysis Education  Provided Yes  Documented Education in Care Plan Yes

## 2017-09-01 NOTE — Progress Notes (Signed)
1st attempt to get Pre HD report   09/01/17 0900  Hand-Off documentation  Report given to (Full Name) Stark Bray  Report received from (Full Name) Levester Fresh

## 2017-09-01 NOTE — Progress Notes (Signed)
Post HD assessment. Pt tolerated tx well without c/o or complication. Net UF 1913, goal met.    09/01/17 1344  Vital Signs  Temp 97.7 F (36.5 C)  Temp Source Axillary  Pulse Rate 75  Pulse Rate Source Monitor  Resp 20  BP 118/77  BP Location Left Arm  BP Method Automatic  Patient Position (if appropriate) Lying  Oxygen Therapy  SpO2 100 %  O2 Device Nasal Cannula  O2 Flow Rate (L/min) 3 L/min  Dialysis Weight  Weight 48.3 kg (106 lb 7.7 oz)  Type of Weight Post-Dialysis  Post-Hemodialysis Assessment  Rinseback Volume (mL) 250 mL  KECN 69.1 V  Dialyzer Clearance Lightly streaked  Duration of HD Treatment -hour(s) 3 hour(s)  Hemodialysis Intake (mL) 500 mL  UF Total -Machine (mL) 2413 mL  Net UF (mL) 1913 mL  Tolerated HD Treatment Yes  AVG/AVF Arterial Site Held (minutes) 10 minutes  AVG/AVF Venous Site Held (minutes) 10 minutes  Education / Care Plan  Dialysis Education Provided Yes  Documented Education in Care Plan Yes

## 2017-09-01 NOTE — Progress Notes (Signed)
Post HD assessment    09/01/17 1342  Neurological  Level of Consciousness Alert  Orientation Level Oriented X4  Respiratory  Respiratory Pattern Regular (intermittent tachypnea )  Chest Assessment Chest expansion symmetrical  Cardiac  ECG Monitor Yes  Antiarrhythmic device Yes  Antiarrhythmic device  Antiarrhythmic device Permanent Pacemaker  Vascular  R Radial Pulse +2  L Radial Pulse +2  Integumentary  Integumentary (WDL) X  Skin Color Appropriate for ethnicity  Musculoskeletal  Musculoskeletal (WDL) X  Generalized Weakness Yes  Assistive Device None  GU Assessment  Genitourinary (WDL) X  Genitourinary Symptoms  (HD)  Psychosocial  Psychosocial (WDL) WDL

## 2017-09-01 NOTE — Progress Notes (Signed)
HD tx end    09/01/17 1326  Vital Signs  Pulse Rate 67  Pulse Rate Source Monitor  Resp 19  BP (!) 107/58  BP Location Left Arm  BP Method Automatic  Patient Position (if appropriate) Lying  Oxygen Therapy  SpO2 100 %  O2 Device Nasal Cannula  O2 Flow Rate (L/min) 3 L/min  During Hemodialysis Assessment  Dialysis Fluid Bolus Normal Saline  Bolus Amount (mL) 250 mL  Intra-Hemodialysis Comments Tx completed

## 2017-09-01 NOTE — Progress Notes (Signed)
[facility]  ROUNDING NOTE   Subjective:   Seen and examined on hemodialysis. Tolerating treatment well. UF goal of 1.5 liters.     HEMODIALYSIS FLOWSHEET:  Blood Flow Rate (mL/min): 400 mL/min Arterial Pressure (mmHg): -230 mmHg Venous Pressure (mmHg): 180 mmHg Transmembrane Pressure (mmHg): 60 mmHg Ultrafiltration Rate (mL/min): 660 mL/min Dialysate Flow Rate (mL/min): 600 ml/min Conductivity: Machine : 13.9 Conductivity: Machine : 13.9 Dialysis Fluid Bolus: Normal Saline Bolus Amount (mL): 250 mL    Objective:  Vital signs in last 24 hours:  Temp:  [97.5 F (36.4 C)] 97.5 F (36.4 C) (08/07 0953) Pulse Rate:  [66-79] 74 (08/07 1148) Resp:  [14-20] 17 (08/07 1148) BP: (95-129)/(59-73) 120/64 (08/07 1148) SpO2:  [99 %-100 %] 100 % (08/07 1148) Weight:  [50.5 kg (111 lb 5.3 oz)] 50.5 kg (111 lb 5.3 oz) (08/07 0953)  Weight change:  Filed Weights   08/30/17 1702 08/30/17 2037 09/01/17 0953  Weight: 50.5 kg (111 lb 5.3 oz) 49.2 kg (108 lb 7.5 oz) 50.5 kg (111 lb 5.3 oz)    Intake/Output: I/O last 3 completed shifts: In: 180 [P.O.:80; IV Piggyback:100] Out: 1324 [MWNUU:7253]   Intake/Output this shift:  No intake/output data recorded.  Physical Exam: General: NAD, laying in bed  Head: Normocephalic, atraumatic. Moist oral mucosal membranes  Eyes: Anicteric, PERRL  Neck: Supple, trachea midline  Lungs:  Bilateral basilar crackles  Heart: Regular rate and rhythm  Abdomen:  Soft, nontender  Extremities:  no peripheral edema.  Neurologic: Nonfocal, moving all four extremities  Skin: +lacerations, +erythema  Access: Left AVF    Basic Metabolic Panel: Recent Labs  Lab 08/30/17 1222  NA 138  K 3.8  CL 96*  CO2 27  GLUCOSE 117*  BUN 64*  CREATININE 5.18*  CALCIUM 8.9    Liver Function Tests: Recent Labs  Lab 08/30/17 1222  AST 28  ALT 16  ALKPHOS 113  BILITOT 1.2  PROT 7.6  ALBUMIN 3.7   No results for input(s): LIPASE, AMYLASE  in the last 168 hours. No results for input(s): AMMONIA in the last 168 hours.  CBC: Recent Labs  Lab 08/30/17 1222  WBC 6.7  NEUTROABS 5.2  HGB 11.5*  HCT 34.6*  MCV 97.2  PLT 186    Cardiac Enzymes: No results for input(s): CKTOTAL, CKMB, CKMBINDEX, TROPONINI in the last 168 hours.  BNP: Invalid input(s): POCBNP  CBG: Recent Labs  Lab 08/31/17 0859  GLUCAP 80    Microbiology: Results for orders placed or performed during the hospital encounter of 08/30/17  Blood culture (routine x 2)     Status: None (Preliminary result)   Collection Time: 08/30/17 12:22 PM  Result Value Ref Range Status   Specimen Description BLOOD BLOOD LEFT WRIST  Final   Special Requests   Final    BOTTLES DRAWN AEROBIC AND ANAEROBIC Blood Culture adequate volume   Culture   Final    NO GROWTH 2 DAYS Performed at [facility], [address], [place], [place] [address]    Report Status PENDING  Incomplete  Blood culture (routine x 2)     Status: None (Preliminary result)   Collection Time: 08/30/17 12:27 PM  Result Value Ref Range Status   Specimen Description BLOOD BLOOD LEFT FOREARM  Final   Special Requests   Final    BOTTLES DRAWN AEROBIC AND ANAEROBIC Blood Culture adequate volume   Culture   Final    NO GROWTH 2 DAYS Performed at [facility], [place], [place] [address]    Report Status PENDING  Incomplete  MRSA PCR Screening     Status: Abnormal   Collection Time: 08/30/17  9:21 PM  Result Value Ref Range Status   MRSA by PCR POSITIVE (A) NEGATIVE Final    Comment:        The GeneXpert MRSA Assay (FDA approved for NASAL specimens only), is one component of a comprehensive MRSA colonization surveillance program. It is not intended to diagnose MRSA infection nor to guide or monitor treatment for MRSA infections. C/KAREEMA HARRIS @2330  08/30/17 FLC Performed at [facility], [address] [place], [place] [address]      Coagulation Studies: No results for input(s): LABPROT, INR in the last 72 hours.  Urinalysis: No results for input(s): COLORURINE, LABSPEC, PHURINE, GLUCOSEU, HGBUR, BILIRUBINUR, KETONESUR, PROTEINUR, UROBILINOGEN, NITRITE, LEUKOCYTESUR in the last 72 hours.  Invalid input(s): APPERANCEUR    Imaging: No results found.   Medications:   . cefTRIAXone (ROCEPHIN)  IV Stopped (08/31/17 1546)   . Chlorhexidine Gluconate Cloth  6 each Topical Q0600  . Chlorhexidine Gluconate Cloth  6 each Topical Q0600  . feeding supplement  1 Container Oral TID BM  . feeding supplement (PRO-STAT SUGAR FREE 64)  30 mL Oral BID  . heparin  5,000 Units Subcutaneous Q12H  . latanoprost  1 drop Both Eyes QHS  . levothyroxine  25 mcg Oral QAC breakfast  . metoprolol succinate  25 mg Oral Daily  . multivitamin  1 tablet Oral QHS  . multivitamin-lutein  1 capsule Oral Daily  . mupirocin ointment  1 application Nasal BID  . sevelamer carbonate  800 mg Oral TID WC  . vitamin C  250 mg Oral BID   acetaminophen **OR** acetaminophen, HYDROcodone-acetaminophen, hydrOXYzine, ondansetron **OR** ondansetron (ZOFRAN) IV  Assessment/ Plan:  [name] is a 84 y.o. white female with end stage renal disease on hemodialysis, coronary artery disease, pacemaker placement, meniere's disease, congestive heart failure, peripheral vascular disease, multiple falls and fractures.   MWF [facility] permc[facility]/left AVF EDW 46.5kg.   1. End-stage renal disease: tolerating treatment well. UF goal of 1.5 liters.  - Continue MWF schedule  2. Anemia of chronic kidney disease: hemoglobin 11.5 - epo as outpatient.   3. Secondary hyperparathyroidism with hyperphosphatemia: PTH not well controlled outpatient labs: 7/8: PTH 1349, calcium 8.5, phosphorus 5.8 - sevelamer with meals  4. Hypertension: blood pressure is at goal during dialysis treatments.  - metoprolol   LOS: 2 Rimsha Trembley 8/7/201912:04 PM

## 2017-09-01 NOTE — Progress Notes (Signed)
[facility] [place] [name]    MR#:  [id number]  DATE OF BIRTH:  Aug 17, 1926  SUBJECTIVE:  CHIEF COMPLAINT: Patient leg redness is getting worse.  For dialysis today  REVIEW OF SYSTEMS:  CONSTITUTIONAL: No fever, fatigue or weakness.  EYES: No blurred or double vision.  EARS, NOSE, AND THROAT: No tinnitus or ear pain.  RESPIRATORY: No cough, shortness of breath, wheezing or hemoptysis.  CARDIOVASCULAR: No chest pain, orthopnea, edema.  GASTROINTESTINAL: No nausea, vomiting, diarrhea or abdominal pain.  GENITOURINARY: No dysuria, hematuria.  ENDOCRINE: No polyuria, nocturia,  HEMATOLOGY: No anemia, easy bruising or bleeding SKIN: Right anterior leg healing laceration, leg swelling/redness  MUSCULOSKELETAL: No joint pain or arthritis.   NEUROLOGIC: No tingling, numbness, weakness.  PSYCHIATRY: No anxiety or depression.   DRUG ALLERGIES:   Allergies  Allergen Reactions  . Statins Other (See Comments)    Muscle weakness severe  . Codeine Sulfate Nausea And Vomiting  . Codeine Other (See Comments)    GI UPSET  . Effexor [Venlafaxine] Nausea Only  . Ezetimibe-Simvastatin Other (See Comments)    Muscle weakness    VITALS:  Blood pressure 128/66, pulse 79, temperature 97.9 F (36.6 C), temperature source Oral, resp. rate 19, height 5\' 4"  (1.626 m), weight 48.3 kg (106 lb 7.7 oz), SpO2 100 %.  PHYSICAL EXAMINATION:  GENERAL:  84 y.o.-year-old patient lying in the bed with no acute distress.  EYES: Pupils equal, round, reactive to light and accommodation. No scleral icterus. Extraocular muscles intact.  HEENT: Head atraumatic, normocephalic. Oropharynx and nasopharynx clear.  NECK:  Supple, no jugular venous distention. No thyroid enlargement, no tenderness.  LUNGS: Normal breath sounds bilaterally, no wheezing, rales,rhonchi or crepitation. No use of accessory muscles of respiration.  CARDIOVASCULAR: S1, S2 normal.  No murmurs, rubs, or gallops.  ABDOMEN: Soft, nontender, nondistended. Bowel sounds present. No organomegaly or mass.  EXTREMITIES: No pedal edema, cyanosis, or clubbing.  NEUROLOGIC: Cranial nerves II through XII are intact. Muscle strength 5/5 in all extremities. Sensation intact. Gait not checked.  PSYCHIATRIC: The patient is alert and oriented x 3.  SKIN:  Right leg healing laceration, left leg erythema is getting worse with acute erythema of the foot, bilateral lower extremity dusky, cold to touch peripheral pulses 1-2+      LABORATORY PANEL:   CBC Recent Labs  Lab 09/01/17 1156  WBC 6.1  HGB 11.7*  HCT 35.7  PLT 209   ------------------------------------------------------------------------------------------------------------------  Chemistries  Recent Labs  Lab 08/30/17 1222 09/01/17 1156  NA 138 138  K 3.8 4.1  CL 96* 99  CO2 27 24  GLUCOSE 117* 218*  BUN 64* 60*  CREATININE 5.18* 4.80*  CALCIUM 8.9 8.7*  AST 28  --   ALT 16  --   ALKPHOS 113  --   BILITOT 1.2  --    ------------------------------------------------------------------------------------------------------------------  Cardiac Enzymes No results for input(s): TROPONINI in the last 168 hours. ------------------------------------------------------------------------------------------------------------------  RADIOLOGY:  No results found.  EKG:   Orders placed or performed during the hospital encounter of 06/28/17  . ED EKG  . ED EKG  . EKG    ASSESSMENT AND PLAN:    84 year old female with past medical history of ESRD - HD M/W/F, OSA, dementia, history of recurrent falls, CAD, ICM w/ EF 95-09%, chronic systolic CHF, secondary hyperparathyroidism who presents to the hospital with right lower extremity cellulitis   *Acute right lower extremity cellulitis  -Blood cultures  are negative so far.  MRSA by PCR is positive Secondary to acute right leg laceration, noted history of frequent  falling events Admit to regular nursing floor bed, cellulitis protocol  IV Rocephin  Encouraged to keep legs elevated Unna boot at the time of discharge.  Compression wraps  *Chronic end-stage renal disease on hemodialysis M/W/F Nephrology consulted for hemodialysis needs as patient is due for hemodialysis today  *Acute bilateral lower extremities peripheral vascular disease and chronic venous changes Consult vascular surgery seen by Dr.   Lucky Cowboy, patient has had both arterial and venous studies done within the past several months which were normal and she has a strong palpable pulses vascular is recommending to continue 2 weeks of outpatient oral antibiotics and compression wraps such as Unna boot Not recommending any vascular interventions at this time  *Chronic diabetes mellitus type 2 Sliding scale insulin with Accu-Cheks per routine   *Chronic benign essential hypertension  Stable  Continue home regiment   *Chronic systolic congestive heart failure with cardiomyopathy ejection fraction 20-25% Mild exacerbation due to end-stage renal disease in need of hemodialysis Continue metoprolol, supplemental oxygen wean as tolerated  *Chronic hypothyroidism  Stable  continue synthroid  *Chronic secondary hyperparathyroidism continue Renvela.     All the records are reviewed and case discussed with Care Management/Social Workerr. Management plans discussed with the patient, family and they are in agreement.  CODE STATUS: Partial code  TOTAL TIME TAKING CARE OF THIS PATIENT: 3minutes.   POSSIBLE D/C IN 1-2DAYS, DEPENDING ON CLINICAL CONDITION.  Note: This dictation was prepared with Dragon dictation along with smaller phrase technology. Any transcriptional errors that result from this process are unintentional.   Nicholes Mango M.D on 09/01/2017 at 3:13 PM  Between 7am to 6pm - Pager - [phone] After 6pm go to [email or web address] - password [email or web address] [facility]  [facility]  Office  [phone]  CC: Primary care physician; Kirk Ruths, MD

## 2017-09-01 NOTE — Progress Notes (Signed)
HD tx start    09/01/17 1012  Vital Signs  Pulse Rate 79  Pulse Rate Source Monitor  Resp 19  BP 120/64  BP Location Left Arm  BP Method Automatic  Patient Position (if appropriate) Sitting  Oxygen Therapy  SpO2 100 %  O2 Device Nasal Cannula  O2 Flow Rate (L/min) 3 L/min  During Hemodialysis Assessment  Blood Flow Rate (mL/min) 400 mL/min  Arterial Pressure (mmHg) -190 mmHg  Venous Pressure (mmHg) 210 mmHg  Transmembrane Pressure (mmHg) 50 mmHg  Ultrafiltration Rate (mL/min) 670 mL/min  Dialysate Flow Rate (mL/min) 600 ml/min  Conductivity: Machine  14  HD Safety Checks Performed Yes  Dialysis Fluid Bolus Normal Saline  Bolus Amount (mL) 250 mL  Intra-Hemodialysis Comments Tx initiated

## 2017-09-02 DIAGNOSIS — F410 Panic disorder [episodic paroxysmal anxiety] without agoraphobia: Secondary | ICD-10-CM

## 2017-09-02 DIAGNOSIS — F600 Paranoid personality disorder: Secondary | ICD-10-CM

## 2017-09-02 DIAGNOSIS — L03115 Cellulitis of right lower limb: Secondary | ICD-10-CM

## 2017-09-02 MED ORDER — TRAMADOL HCL 50 MG PO TABS: 50.0000 mg | ORAL_TABLET | Freq: Three times a day (TID) | ORAL | Status: DC | PRN

## 2017-09-02 MED ORDER — SODIUM CHLORIDE 0.9 % IV SOLN
1.0000 g | INTRAVENOUS | Status: DC
  Administered 2017-09-02 – 2017-09-05 (×4): 1 g via INTRAVENOUS
  Filled 2017-09-02 (×2): qty 10
  Filled 2017-09-02 (×3): qty 1

## 2017-09-02 NOTE — Clinical Social Work Note (Signed)
Clinical Social Work Assessment  Patient Details  [name] MRN: [id number] Date of Birth: 01-06-1927  Date of referral:  09/02/17               Reason for consult:  Facility Placement                Permission sought to share information with:  Case Manager, Customer service manager, Family Supports Permission granted to share information::  Yes, Verbal Permission Granted  Name::        Agency::     Relationship::     Contact Information:     Housing/Transportation Living arrangements for the past 2 months:  [place] of Information:  Other (Comment Required)(Neice) Patient Interpreter Needed:  None Criminal Activity/Legal Involvement Pertinent to Current Situation/Hospitalization:  No - Comment as needed Significant Relationships:  Siblings, Other Family Members Lives with:  Facility Resident Do you feel safe going back to the place where you live?  Yes Need for family participation in patient care:  Yes (Comment)  Care giving concerns:  Patient lives at [facility].    Social Worker assessment / plan:  CSW consulted for facility placement. CSW contacted patient's brother Santina Evans [phone] but he was unavailable. CSW contacted patient's niece Eustace Moore [phone]. Niece states that patient has been a long term resident at [facility] for almost 2 years. Family would like for her to return to [facility] at discharge. CSW will continue to follow for discharge planning.   Employment status:  Retired Forensic scientist:  Medicare PT Recommendations:  Not assessed at this time Information / Referral to community resources:     Patient/Family's Response to care:  Patient is confused   Patient/Family's Understanding of and Emotional Response to Diagnosis, Current Treatment, and Prognosis:  Family is in agreement with SNF   Emotional Assessment Appearance:  Appears stated age Attitude/Demeanor/Rapport:   Lethargic Affect (typically observed):  Withdrawn Orientation:  Oriented to Self Alcohol / Substance use:  Not Applicable Psych involvement (Current and /or in the community):  No (Comment)  Discharge Needs  Concerns to be addressed:  Discharge Planning Concerns Readmission within the last 30 days:  No Current discharge risk:  None Barriers to Discharge:  Continued Medical Work up   [employer], Carmen 09/02/2017, 4:21 PM

## 2017-09-02 NOTE — Progress Notes (Signed)
[facility]  ROUNDING NOTE   Subjective:   Patient complains of anxiety.   Hemodialysis treatment yesterday. Tolerated treatment well. UF of 1980mL   Objective:  Vital signs in last 24 hours:  Temp:  [97.7 F (36.5 C)-98.2 F (36.8 C)] 98.2 F (36.8 C) (08/08 0435) Pulse Rate:  [66-84] 81 (08/08 0844) Resp:  [14-20] 20 (08/08 0435) BP: (100-144)/(49-96) 118/72 (08/08 0844) SpO2:  [92 %-100 %] 92 % (08/08 0435) Weight:  [48.3 kg] 48.3 kg (08/07 1344)  Weight change:  Filed Weights   08/30/17 2037 09/01/17 0953 09/01/17 1344  Weight: 49.2 kg 50.5 kg 48.3 kg    Intake/Output: I/O last 3 completed shifts: In: 0  Out: 1913 [[id number]]   Intake/Output this shift:  No intake/output data recorded.  Physical Exam: General: NAD, sitting in chair  Head: Normocephalic, atraumatic. Moist oral mucosal membranes  Eyes: Anicteric, PERRL  Neck: Supple, trachea midline  Lungs:  Clear   Heart: Regular rate and rhythm  Abdomen:  Soft, nontender  Extremities:  no peripheral edema. Left UNNA boot  Neurologic: Nonfocal, moving all four extremities  Skin: +lacerations, +erythema  Access: Left AVF    Basic Metabolic Panel: Recent Labs  Lab 08/30/17 1222 09/01/17 1156  NA 138 138  K 3.8 4.1  CL 96* 99  CO2 27 24  GLUCOSE 117* 218*  BUN 64* 60*  CREATININE 5.18* 4.80*  CALCIUM 8.9 8.7*  PHOS  --  6.5*    Liver Function Tests: Recent Labs  Lab 08/30/17 1222 09/01/17 1156  AST 28  --   ALT 16  --   ALKPHOS 113  --   BILITOT 1.2  --   PROT 7.6  --   ALBUMIN 3.7 3.5   No results for input(s): LIPASE, AMYLASE in the last 168 hours. No results for input(s): AMMONIA in the last 168 hours.  CBC: Recent Labs  Lab 08/30/17 1222 09/01/17 1156  WBC 6.7 6.1  NEUTROABS 5.2  --   HGB 11.5* 11.7*  HCT 34.6* 35.7  MCV 97.2 96.5  PLT 186 209    Cardiac Enzymes: No results for input(s): CKTOTAL, CKMB, CKMBINDEX, TROPONINI in the last 168  hours.  BNP: Invalid input(s): POCBNP  CBG: Recent Labs  Lab 08/31/17 0859  GLUCAP 80    Microbiology: Results for orders placed or performed during the hospital encounter of 08/30/17  Blood culture (routine x 2)     Status: None (Preliminary result)   Collection Time: 08/30/17 12:22 PM  Result Value Ref Range Status   Specimen Description BLOOD BLOOD LEFT WRIST  Final   Special Requests   Final    BOTTLES DRAWN AEROBIC AND ANAEROBIC Blood Culture adequate volume   Culture   Final    NO GROWTH 3 DAYS Performed at [facility], [address], [place], [place] [address]    Report Status PENDING  Incomplete  Blood culture (routine x 2)     Status: None (Preliminary result)   Collection Time: 08/30/17 12:27 PM  Result Value Ref Range Status   Specimen Description BLOOD BLOOD LEFT FOREARM  Final   Special Requests   Final    BOTTLES DRAWN AEROBIC AND ANAEROBIC Blood Culture adequate volume   Culture   Final    NO GROWTH 3 DAYS Performed at [facility], [address] [place], [place] [address]    Report Status PENDING  Incomplete  MRSA PCR Screening     Status: Abnormal   Collection  Time: 08/30/17  9:21 PM  Result Value Ref Range Status   MRSA by PCR POSITIVE (A) NEGATIVE Final    Comment:        The GeneXpert MRSA Assay (FDA approved for NASAL specimens only), is one component of a comprehensive MRSA colonization surveillance program. It is not intended to diagnose MRSA infection nor to guide or monitor treatment for MRSA infections. C/Sharon [name] @2330  08/30/17 FLC Performed at [facility], [address] [place], [place] [address]     Coagulation Studies: No results for input(s): LABPROT, INR in the last 72 hours.  Urinalysis: No results for input(s): COLORURINE, LABSPEC, PHURINE, GLUCOSEU, HGBUR, BILIRUBINUR, KETONESUR, PROTEINUR, UROBILINOGEN, NITRITE, LEUKOCYTESUR in the last 72 hours.  Invalid input(s): APPERANCEUR     Imaging: No results found.   Medications:   . cefTRIAXone (ROCEPHIN)  IV Stopped (09/01/17 1727)   . Chlorhexidine Gluconate Cloth  6 each Topical Q0600  . Chlorhexidine Gluconate Cloth  6 each Topical Q0600  . feeding supplement  1 Container Oral TID BM  . feeding supplement (PRO-STAT SUGAR FREE 64)  30 mL Oral BID  . heparin  5,000 Units Subcutaneous Q12H  . latanoprost  1 drop Both Eyes QHS  . levothyroxine  25 mcg Oral QAC breakfast  . metoprolol succinate  25 mg Oral Daily  . multivitamin  1 tablet Oral QHS  . multivitamin-lutein  1 capsule Oral Daily  . mupirocin ointment  1 application Nasal BID  . sevelamer carbonate  800 mg Oral TID WC  . vitamin C  250 mg Oral BID   acetaminophen **OR** acetaminophen, HYDROcodone-acetaminophen, hydrOXYzine, ondansetron **OR** ondansetron (ZOFRAN) IV  Assessment/ Plan:  Sharon [name] is a 84 y.o. white female with end stage renal disease on hemodialysis, coronary artery disease, pacemaker placement, meniere's disease, congestive heart failure, peripheral vascular disease, multiple falls and fractures.   MWF [facility] permcath/left AVF EDW 46.5kg.   1. End-stage renal disease: tolerated treatment well yesterday.  - Continue MWF schedule. Dialysis for tomorrow - Suspect her estimated dry weight is too high and may need to be closer to 44kg.   2. Anemia of chronic kidney disease: hemoglobin 11.7 - epo as outpatient.   3. Secondary hyperparathyroidism with hyperphosphatemia: PTH not well controlled outpatient labs: 7/8: PTH 1349, calcium 8.5, phosphorus 5.8 - sevelamer with meals  4. Hypertension: blood pressure is at goal   - metoprolol   LOS: 3 Sharon [name] 8/8/201910:11 AM

## 2017-09-02 NOTE — Progress Notes (Signed)
Verbal order from MD to take unna boot/compression dressing off of right lower extremity due to pt discomfort. MD made aware of pt.'s edema and poor circulation to bilateral legs. RN will continue to monitor pt.   Lynnmarie Lovett [employer]

## 2017-09-02 NOTE — Progress Notes (Signed)
Vein & Vascular Surgery  Daily Progress Note   Subjective: Spent almost 45 minutes with the patient this afternoon.  Patient very anxious having panic attacks when she realizes you were trying to leave the room.  Extremely paranoid stating medical staff at the hospital is trying to drug her.  Asking if I can discontinue her Vicodin because the hospital staff is trying to make her a zombie.  Perseverating on that she needs help from a doctor and nobody is helping her.  Patient's brother was present in the room for about 20 minutes before he left.    Objective: Vitals:   09/01/17 1417 09/01/17 2044 09/02/17 0435 09/02/17 0844  BP: 128/66 (!) 119/96 (!) 142/73 118/72  Pulse: 79 68 84 81  Resp: 19 20 20    Temp: 97.9 F (36.6 C) 97.9 F (36.6 C) 98.2 F (36.8 C)   TempSrc: Oral Oral Oral   SpO2:  100% 92%   Weight:      Height:       No intake or output data in the 24 hours ending 09/02/17 1647  Physical Exam: A&Ox3, NAD CV: RRR Pulmonary: CTA Bilaterally Abdomen: Soft, Nontender, Nondistended Right Upper Extremity: Fistula with good bruit and thrill. Vascular:  Right Lower Extremity: Wounds covered in dressings. Erythematous  Left Lower Extremity: Bluish discoloration. Cooler in temp.    Laboratory: CBC    Component Value Date/Time   WBC 6.1 09/01/2017 1156   HGB 11.7 (L) 09/01/2017 1156   HGB 12.0 04/24/2013 1557   HCT 35.7 09/01/2017 1156   HCT 35.6 04/24/2013 1557   PLT 209 09/01/2017 1156   PLT 216 04/24/2013 1557   BMET    Component Value Date/Time   NA 138 09/01/2017 1156   NA 142 04/24/2013 1557   K 4.1 09/01/2017 1156   K 3.8 04/24/2013 1557   CL 99 09/01/2017 1156   CL 106 04/24/2013 1557   CO2 24 09/01/2017 1156   CO2 32 04/24/2013 1557   GLUCOSE 218 (H) 09/01/2017 1156   GLUCOSE 109 (H) 04/24/2013 1557   BUN 60 (H) 09/01/2017 1156   BUN 40 (A) 09/03/2014   BUN 30 (H) 04/24/2013 1557   CREATININE 4.80 (H) 09/01/2017 1156   CREATININE 2.68  (H) 04/24/2013 1557   CALCIUM 8.7 (L) 09/01/2017 1156   CALCIUM 8.7 04/24/2013 1557   GFRNONAA 7 (L) 09/01/2017 1156   GFRNONAA 16 (L) 04/24/2013 1557   GFRAA 8 (L) 09/01/2017 1156   GFRAA 18 (L) 04/24/2013 1557   Assessment/Planning: 84 year old female with end-stage renal disease and numerous other comorbid conditions admitted for cellulitis of the lower extremity 1) Patient is extremely paranoid and anxious during my visit. See above. 2) I had a long talk with the patient about the need to wear compression on her legs.  The patient states that the nurses at [facility] have told her that is not their job to put on compression socks.  The patient had compression wraps placed by our nurses however made the nurses take them off due to pain.  I explained that without any type of compression and elevation the fluid that builds up in her leg will continue to infect her skin and cause wounds.  The patient has now agreed to wear compression socks and elevate her legs.   3) Patients last ABI (04/14/17) was normal.  The patient's left lower extremity does have a bluish tent and is cooler in temperature when compared to the right.  The  patient can have thrown an embolus at some point.  If the patient is willing and can sit still would possibly recommend CTA to assess arterial patency. 4) the patient does not have any venous reflux as per a venous duplex conducted on Jun 08, 2017.  Patient is not a candidate for any type of endovenous laser ablation to help with the swelling and subsequent cellulitis.  Patient will have to manage her edema conservatively which includes medical grade 1 compression socks, elevation and possibly the addition of a lymphedema pump. 5) I discontinued the patient's Vicodin and changed it to tramadol as she was adamant that the Vicodin was being prescribed to her to make her a "zombie".  I assured the patient that was not true however she felt that she was being forced to take  it.  Discussed with Dr. Ellis Parents Lilla Callejo PA-C 09/02/2017 4:47 PM

## 2017-09-02 NOTE — Progress Notes (Signed)
[place] [facility] [place] [name]    MR#:  [id number]  DATE OF BIRTH:  17-Sep-1926  SUBJECTIVE:  CHIEF COMPLAINT: Patient is complaining of severe right lower extremity pain since the Unna boot is placed, beginning to remove the Unna boot, very anxious.  Had dialysis yesterday   REVIEW OF SYSTEMS:  CONSTITUTIONAL: No fever, fatigue or weakness.  EYES: No blurred or double vision.  EARS, NOSE, AND THROAT: No tinnitus or ear pain.  RESPIRATORY: No cough, shortness of breath, wheezing or hemoptysis.  CARDIOVASCULAR: No chest pain, orthopnea, edema.  GASTROINTESTINAL: No nausea, vomiting, diarrhea or abdominal pain.  GENITOURINARY: No dysuria, hematuria.  ENDOCRINE: No polyuria, nocturia,  HEMATOLOGY: No anemia, easy bruising or bleeding SKIN: Right anterior leg healing laceration, leg swelling/redness  MUSCULOSKELETAL: No joint pain or arthritis.   NEUROLOGIC: No tingling, numbness, weakness.  PSYCHIATRY: No anxiety or depression.   DRUG ALLERGIES:   Allergies  Allergen Reactions  . Statins Other (See Comments)    Muscle weakness severe  . Codeine Sulfate Nausea And Vomiting  . Codeine Other (See Comments)    GI UPSET  . Effexor [Venlafaxine] Nausea Only  . Ezetimibe-Simvastatin Other (See Comments)    Muscle weakness    VITALS:  Blood pressure 118/72, pulse 81, temperature 98.2 F (36.8 C), temperature source Oral, resp. rate 20, height 5\' 4"  (1.626 m), weight 48.3 kg, SpO2 92 %.  PHYSICAL EXAMINATION:  GENERAL:  84 y.o.-year-old patient lying in the bed with no acute distress.  EYES: Pupils equal, round, reactive to light and accommodation. No scleral icterus. Extraocular muscles intact.  HEENT: Head atraumatic, normocephalic. Oropharynx and nasopharynx clear.  NECK:  Supple, no jugular venous distention. No thyroid enlargement, no tenderness.  LUNGS: Normal breath sounds bilaterally, no wheezing, rales,rhonchi or  crepitation. No use of accessory muscles of respiration.  CARDIOVASCULAR: S1, S2 normal. No murmurs, rubs, or gallops.  ABDOMEN: Soft, nontender, nondistended. Bowel sounds present. No organomegaly or mass.  EXTREMITIES: No pedal edema, cyanosis, or clubbing.  NEUROLOGIC: Cranial nerves II through XII are intact. Muscle strength 5/5 in all extremities. Sensation intact. Gait not checked.  PSYCHIATRIC: The patient is alert and oriented x 3.  SKIN:  Right leg healing laceration, left leg dusky purplish erythema is getting worse with acute erythema of the foot, bilateral lower extremity dusky, cold to touch peripheral pulses 1+      LABORATORY PANEL:   CBC Recent Labs  Lab 09/01/17 1156  WBC 6.1  HGB 11.7*  HCT 35.7  PLT 209   ------------------------------------------------------------------------------------------------------------------  Chemistries  Recent Labs  Lab 08/30/17 1222 09/01/17 1156  NA 138 138  K 3.8 4.1  CL 96* 99  CO2 27 24  GLUCOSE 117* 218*  BUN 64* 60*  CREATININE 5.18* 4.80*  CALCIUM 8.9 8.7*  AST 28  --   ALT 16  --   ALKPHOS 113  --   BILITOT 1.2  --    ------------------------------------------------------------------------------------------------------------------  Cardiac Enzymes No results for input(s): TROPONINI in the last 168 hours. ------------------------------------------------------------------------------------------------------------------  RADIOLOGY:  No results found.  EKG:   Orders placed or performed during the hospital encounter of 06/28/17  . ED EKG  . ED EKG  . EKG    ASSESSMENT AND PLAN:    84 year old female with past medical history of ESRD - HD M/W/F, OSA, dementia, history of recurrent falls, CAD, ICM w/ EF 15-72%, chronic systolic CHF, secondary hyperparathyroidism who presents to the  hospital with right lower extremity cellulitis   *Acute right lower extremity cellulitis  -Blood cultures are negative  so far.  MRSA by PCR is positive Secondary to acute right leg laceration, noted history of frequent falling events Admit to regular nursing floor bed, cellulitis protocol  IV Rocephin  Encouraged to keep legs elevated Patient could not tolerate Unna boot at this time requesting to remove the [employer] boot  *Chronic end-stage renal disease on hemodialysis M/W/F Nephrology consulted for hemodialysis needs as patient is due for hemodialysis yesterday  *Acute bilateral lower extremities peripheral vascular disease and chronic venous changes Consult vascular surgery seen by Dr.   Lucky Cowboy, patient has had both arterial and venous studies done within the past several months which were normal and she has a strong palpable pulses vascular is recommending to continue 2 weeks of outpatient oral antibiotics and compression wraps such as Unna boot but patient could not tolerate Unna boot Not recommending any vascular interventions at this time Discussed with Dr. Lucky Cowboy today as patient's leg pain is getting worse, Unna boot removed per patient's request and discussed with Dr. Lucky Cowboy he will reevaluate the patient  *Chronic diabetes mellitus type 2 Sliding scale insulin with Accu-Cheks per routine   *Chronic benign essential hypertension  Stable  Continue home regiment   *Chronic systolic congestive heart failure with cardiomyopathy ejection fraction 20-25% Mild exacerbation due to end-stage renal disease in need of hemodialysis Continue metoprolol, supplemental oxygen wean as tolerated  *Chronic hypothyroidism  Stable  continue synthroid  *Chronic secondary hyperparathyroidism continue Renvela.  *Anxiety/pruritus Vistaril as needed   All the records are reviewed and case discussed with Care Management/Social Workerr. Management plans discussed with the patient, family and they are in agreement.  CODE STATUS: Partial code  TOTAL TIME TAKING CARE OF THIS PATIENT: 57minutes.   POSSIBLE D/C IN  1-2DAYS, DEPENDING ON CLINICAL CONDITION.  Note: This dictation was prepared with Dragon dictation along with smaller phrase technology. Any transcriptional errors that result from this process are unintentional.   Nicholes Mango M.D on 09/02/2017 at 2:40 PM  Between 7am to 6pm - Pager - [phone] After 6pm go to [email or web address] - password [email or web address] [facility]  [facility]  Office  [phone]  CC: Primary care physician; Kirk Ruths, MD

## 2017-09-03 DIAGNOSIS — N186 End stage renal disease: Secondary | ICD-10-CM

## 2017-09-03 DIAGNOSIS — Z992 Dependence on renal dialysis: Secondary | ICD-10-CM

## 2017-09-03 NOTE — Care Management Important Message (Signed)
Important Message  Patient Details  [name] MRN: [id number] Date of Birth: 04-Feb-1926   Medicare Important Message Given:  Yes    Beverly Sessions, RN 09/03/2017, 11:18 AM

## 2017-09-03 NOTE — Progress Notes (Signed)
This note also relates to the following rows which could not be included: Resp - Cannot attach notes to unvalidated device data BP - Cannot attach notes to unvalidated device data  Hd started.  Patient confused, pulling at lines, cant get comfortable. Decreased BFR to 357ml/min for patient safety.

## 2017-09-03 NOTE — Consult Note (Signed)
Consultation Note Date: 09/03/2017   Patient [name]  DOB: 1926-11-17  MRN: [id number]  Age / Sex: 84 y.o., female  PCP: Kirk Ruths, MD Referring Physician: Nicholes Mango, MD  Reason for Consultation: Establishing goals of care  HPI/Patient Profile: 84 y.o. female  with past medical history of ESRD on HD, CAD, CHF, (EF 20-25%), pacemaker, PVD, dementia, and frequent falls  admitted on 08/30/2017 with right lower extremity cellulitis secondary to laceration from fall. Patient frequently falls. On IV antibiotics. Patient refused Haematologist. Pt also refused HD on 8/9 - pulling out needle after 30 minutes. Patient not eating or drinking much. Mild CHF exacerbation d/t ESRD. Patient has been having severe anxiety and agitation throughout admission. PMT consulted d/t FTT.  Clinical Assessment and Goals of Care: I have reviewed medical records including EPIC notes, labs and imaging, received report from bedside RN, assessed the patient and then met at the bedside along with patient's brother, Ed,  to discuss diagnosis prognosis, GOC, EOL wishes, disposition and options.  I introduced Palliative Medicine as specialized medical care for people living with serious illness. It focuses on providing relief from the symptoms and stress of a serious illness. The goal is to improve quality of life for both the patient and the family.  Ed has met with PMT multiple times and is very familiar with our team.  We discussed a brief life review of the patient. She is widowed. Close to her brothers and sisters and their children. Enjoys time with her family.   As far as functional and nutritional status, her brother tells me of a decline. She is essentially bedbound and falls frequently. He tells me after last discharge the patient did well for a couple of weeks but then shares that the past 3 weeks have not been good. He has noted more shortness of  breath and a change in her mood.     We discussed "Sharon [name]'s" current illness and what it means in the larger context of her on-going co-morbidities.  Natural disease trajectory and expectations at EOL were discussed. I shared my concerns that patient may be nearing end of life - we discussed her restlessness and anxiety, her shortness of breath, and her noncompliance with medical therapies.   Ed was shocked about Sharon [name] refusing HD. He tells me "she knows what that means" and "I think she's tired".  Ed attempts to speak to Sharon [name] and ask her why she did not complete HD treatment. Sharon [name] does not open her eyes, but appears restless in the bed. At first, she only mumbles unintelligible words. After several minutes of Ed attempting to talk to her, she shouts "I'm tired". Ed became tearful after seeing this and had to leave the room. He tells me he has never seen her like this.   We discussed that Sharon [name] may have reached a point where she does not want to continue dialysis. Ed shares his desire to "try again" and see if she will receive HD. He also voices that he understands how ill she is and knows this may be "the end".   We talked about hospice support for Sharon [name] if she continues to decline or refuse HD. He agrees to this. Discussed continuing current care for 24-48 hours and then discussing plan of care based on clinical course.   Questions and concerns were addressed. The family was encouraged to call with questions or concerns.   Primary Decision Maker HCPOA - brother, Ed joined by patient -  patient's mental status fluctuates - during my evaluation she was confused    SUMMARY OF RECOMMENDATIONS   - Ed would like to make another attempt at HD if patient will comply - he understands how ill she is - he would like to continue current care for 24-48 hours before making any further decisions - discussed possibility of hospice care  Code Status/Advance Care Planning:  Limited code - established by other provider -  no intubation or CPR   Symptom Management:   Per primary  Psycho-social/Spiritual:   Desire for further Chaplaincy support:no  Additional Recommendations: Education on Hospice  Prognosis:   Unable to determine - depends on next couple of days, likely eligible for residential hospice  Discharge Planning: To Be Determined      Primary Diagnoses: Present on Admission: . Left leg cellulitis   I have reviewed the medical record, interviewed the patient and family, and examined the patient. The following aspects are pertinent.  Past Medical History:  Diagnosis Date  . Anemia   . Anxiety   . Arthritis   . CAD (coronary artery disease)   . Cancer ([place])    skin  . Cardiomyopathy ([place])   . CHF (congestive heart failure) ([place])   . Depression   . IBS (irritable bowel syndrome) 2010  . Kidney failure July 2012   Hemodialysis 3xweek  . Kidney failure   . Meniere disease   . Meniere's disease   . Myocardial infarction ([place])   . OSA (obstructive sleep apnea)    CPAP  . Peritoneal dialysis status ([place])   . Presence of IVC filter 2019  . Presence of permanent cardiac pacemaker   . Renal insufficiency    Social History   Socioeconomic History  . Marital status: Widowed    Spouse name: Not on file  . Number of children: 0  . Years of education: Not on file  . Highest education level: Not on file  Occupational History  . Occupation: Retired   Scientific laboratory technician  . Financial resource strain: Not on file  . Food insecurity:    Worry: Not on file    Inability: Not on file  . Transportation needs:    Medical: Not on file    Non-medical: Not on file  Tobacco Use  . Smoking status: Former Smoker    Last attempt to quit: 03/31/1948    Years since quitting: 69.4  . Smokeless tobacco: Never Used  Substance and Sexual Activity  . Alcohol use: No  . Drug use: No  . Sexual activity: Never  Lifestyle  . Physical activity:    Days per week: Not on file    Minutes per session:  Not on file  . Stress: Not on file  Relationships  . Social connections:    Talks on phone: Not on file    Gets together: Not on file    Attends religious service: Not on file    Active member of club or organization: Not on file    Attends meetings of clubs or organizations: Not on file    Relationship status: Not on file  Other Topics Concern  . Not on file  Social History Narrative   Lives in [place] alone. Widow 2012.      Regular Exercise -  Housework   Daily Caffeine Use:  1 - 2 cups coffee            Family History  Problem Relation Age of Onset  . Cancer Mother        ?  ovarian - sarcoma  . Cancer Father        Skin cancer  . Diabetes Sister   . COPD Sister   . Depression Sister   . Cancer Sister        Lung - 46 yrs old   Scheduled Meds: . Chlorhexidine Gluconate Cloth  6 each Topical Q0600  . Chlorhexidine Gluconate Cloth  6 each Topical Q0600  . feeding supplement  1 Container Oral TID BM  . feeding supplement (PRO-STAT SUGAR FREE 64)  30 mL Oral BID  . heparin  5,000 Units Subcutaneous Q12H  . latanoprost  1 drop Both Eyes QHS  . levothyroxine  25 mcg Oral QAC breakfast  . metoprolol succinate  25 mg Oral Daily  . multivitamin  1 tablet Oral QHS  . multivitamin-lutein  1 capsule Oral Daily  . mupirocin ointment  1 application Nasal BID  . sevelamer carbonate  800 mg Oral TID WC  . vitamin C  250 mg Oral BID   Continuous Infusions: . cefTRIAXone (ROCEPHIN)  IV Stopped (09/02/17 1800)   PRN Meds:.acetaminophen **OR** acetaminophen, hydrOXYzine, ondansetron **OR** ondansetron (ZOFRAN) IV, traMADol Allergies  Allergen Reactions  . Statins Other (See Comments)    Muscle weakness severe  . Codeine Sulfate Nausea And Vomiting  . Codeine Other (See Comments)    GI UPSET  . Effexor [Venlafaxine] Nausea Only  . Ezetimibe-Simvastatin Other (See Comments)    Muscle weakness   Review of Systems  Unable to perform ROS: Mental status change     Physical Exam  Constitutional: She appears lethargic. She has a sickly appearance. No distress.  Thin, frail  HENT:  Head: Normocephalic and atraumatic.  Cardiovascular: Normal rate and regular rhythm.  Pulmonary/Chest: Breath sounds normal.  Mild accessory muscle use, nasal cannula in place  Abdominal: Soft. There is no tenderness.  Musculoskeletal:  Erythematous RLE, discolored LLE  Neurological: She appears lethargic.  Does not answer orientation questions  Skin:  Cool extremities  Psychiatric: Cognition and memory are impaired.    Vital Signs: BP 123/72   Pulse 73   Temp 97.8 F (36.6 C) (Axillary)   Resp 20   Ht _0  (1.626 m)   Wt 48.3 kg   SpO2 95%   BMI 18.28 kg/m  Pain Scale: 0-10 POSS *See Group Information*: 1-Acceptable,Awake and alert Pain Score: 0-No pain   SpO2: SpO2: 95 % O2 Device:SpO2: 95 % O2 Flow Rate: .O2 Flow Rate (L/min): 3 L/min  IO: Intake/output summary:   Intake/Output Summary (Last 24 hours) at 09/03/2017 1647 Last data filed at 09/03/2017 1100 Gross per 24 hour  Intake 2 ml  Output -176 ml  Net 178 ml    LBM: Last BM Date: 09/02/17 Baseline Weight: Weight: 45.4 kg Most recent weight: Weight: 48.3 kg     Palliative Assessment/Data: PPS 20%     Time In: 1430 Time Out: 1630 Time Total: 120 minutes Greater than 50%  of this time was spent counseling and coordinating care related to the above assessment and plan.  Juel Burrow, DNP, AGNP-C Palliative Medicine Team [phone] Pager: [phone]

## 2017-09-03 NOTE — Progress Notes (Signed)
Chaplain responded to a rapid Response. Pt was found to be of high anxiety. She was sitting up , lean in almost a fetal position. Chaplain practiced active listening and pastoral presence, She discussed her sister who lives close by but cant get any one to bring her by. Also she talked about her brother and other friends and relative. She seemed to relax when she talks about God and in her description of Karen's house which sits on the [place]. She talks about seeing the rolling hills of [place] from the deck. She then relaxes and leans back in the chair but only for a few moments before she then resume the anxious position.  We tried to practice some imagery exercises but she could not stay focused on the image that relaxes her. She said her "minister" comes by almost daily; he makes her laugh. Adding humor may also aid in her relaxing.  She has the lights on  Brightly which could be dimmed to present a more calming environment and possible images of nature may help her as she is a person of faith as she talks of her pew partners and other church experiences. Chaplain will suggest follow up visits.   09/03/17 0500  Clinical Encounter Type  Visited With Patient  Visit Type Initial;Spiritual support;Code  Referral From Care management  Spiritual Encounters  Spiritual Needs Prayer;Emotional

## 2017-09-03 NOTE — Progress Notes (Signed)
[place] at [place] NAME: Sakiya Stepka    MR#:  [id number]  DATE OF BIRTH:  October 12, 1926  SUBJECTIVE:  CHIEF COMPLAINT: Patient pulled her needle and could not get her dialysis after 30 minutes of the treatment.  Refusing Unna boot Seen by palliative care, discussed with patient's brother who is the healthcare power of attorney.  Patient reports that she is tired  REVIEW OF SYSTEMS:  CONSTITUTIONAL: No fever, fatigue or weakness.  EYES: No blurred or double vision.  EARS, NOSE, AND THROAT: No tinnitus or ear pain.  RESPIRATORY: No cough, shortness of breath, wheezing or hemoptysis.  CARDIOVASCULAR: No chest pain, orthopnea, edema.  GASTROINTESTINAL: No nausea, vomiting, diarrhea or abdominal pain.  GENITOURINARY: No dysuria, hematuria.  ENDOCRINE: No polyuria, nocturia,  HEMATOLOGY: No anemia, easy bruising or bleeding SKIN: Right anterior leg healing laceration, leg swelling/redness  MUSCULOSKELETAL: No joint pain or arthritis.   NEUROLOGIC: No tingling, numbness, weakness.  PSYCHIATRY: No anxiety or depression.   DRUG ALLERGIES:   Allergies  Allergen Reactions  . Statins Other (See Comments)    Muscle weakness severe  . Codeine Sulfate Nausea And Vomiting  . Codeine Other (See Comments)    GI UPSET  . Effexor [Venlafaxine] Nausea Only  . Ezetimibe-Simvastatin Other (See Comments)    Muscle weakness    VITALS:  Blood pressure 123/72, pulse 73, temperature 97.8 F (36.6 C), temperature source Axillary, resp. rate 20, height 5\' 4"  (1.626 m), weight 48.3 kg, SpO2 95 %.  PHYSICAL EXAMINATION:  GENERAL:  84 y.o.-year-old patient lying in the bed with no acute distress.  EYES: Pupils equal, round, reactive to light and accommodation. No scleral icterus. Extraocular muscles intact.  HEENT: Head atraumatic, normocephalic. Oropharynx and nasopharynx clear.  NECK:  Supple, no jugular venous distention. No thyroid enlargement, no  tenderness.  LUNGS: Normal breath sounds bilaterally, no wheezing, rales,rhonchi or crepitation. No use of accessory muscles of respiration.  CARDIOVASCULAR: S1, S2 normal. No murmurs, rubs, or gallops.  ABDOMEN: Soft, nontender, nondistended. Bowel sounds present.  EXTREMITIES: No  clubbing.  NEUROLOGIC: Awake and alert, oriented x3  Gait not checked.  PSYCHIATRIC: The patient is alert and oriented x 3.  SKIN:  Right leg healing laceration, left leg dusky purplish erythema is getting worse with acute erythema of the foot, bilateral lower extremity dusky, cold to touch peripheral pulses 1+      LABORATORY PANEL:   CBC Recent Labs  Lab 09/01/17 1156  WBC 6.1  HGB 11.7*  HCT 35.7  PLT 209   ------------------------------------------------------------------------------------------------------------------  Chemistries  Recent Labs  Lab 08/30/17 1222 09/01/17 1156  NA 138 138  K 3.8 4.1  CL 96* 99  CO2 27 24  GLUCOSE 117* 218*  BUN 64* 60*  CREATININE 5.18* 4.80*  CALCIUM 8.9 8.7*  AST 28  --   ALT 16  --   ALKPHOS 113  --   BILITOT 1.2  --    ------------------------------------------------------------------------------------------------------------------  Cardiac Enzymes No results for input(s): TROPONINI in the last 168 hours. ------------------------------------------------------------------------------------------------------------------  RADIOLOGY:  No results found.  EKG:   Orders placed or performed during the hospital encounter of 06/28/17  . ED EKG  . ED EKG  . EKG    ASSESSMENT AND PLAN:    84 year old female with past medical history of ESRD - HD M/W/F, OSA, dementia, history of recurrent falls, CAD, ICM w/ EF 86-76%, chronic systolic CHF, secondary hyperparathyroidism who presents to the hospital with  right lower extremity cellulitis    *Failure to thrive Palliative care consulted, had a meeting with patient's brother healthcare power of  attorney, who has not requested to give her 1 or 2 daytime through the weekend if no clinical improvement he will consider hospice care  *Acute right lower extremity cellulitis  -Blood cultures are negative so far.  MRSA by PCR is positive Secondary to acute right leg laceration, noted history of frequent falling events Admit to regular nursing floor bed, cellulitis protocol  IV Rocephin  Encouraged to keep legs elevated Patient could not tolerate Unna boot at this time requesting to remove the [employer] boot  *Chronic end-stage renal disease on hemodialysis M/W/F Nephrology consulted for hemodialysis needs as patient is due for hemodialysis yesterday  *Acute bilateral lower extremities peripheral vascular disease and chronic venous changes Consult vascular surgery seen by Dr.   Lucky Cowboy, patient has had both arterial and venous studies done within the past several months which were normal and she has a strong palpable pulses vascular is recommending to continue 2 weeks of outpatient oral antibiotics and compression wraps such as Unna boot but patient could not tolerate Unna boot Not recommending any vascular interventions at this time Discussed with Dr. Lucky Cowboy 09/02/2017 , reevaluated by vascular PA who has offered CTA if patient is willing , but patient is refusing everything today   *Chronic diabetes mellitus type 2 Sliding scale insulin with Accu-Cheks per routine   *Chronic benign essential hypertension  Stable  Continue home regiment   *Chronic systolic congestive heart failure with cardiomyopathy ejection fraction 20-25% Mild exacerbation due to end-stage renal disease in need of hemodialysis Continue metoprolol, supplemental oxygen wean as tolerated  *Chronic hypothyroidism  Stable  continue synthroid  *Chronic secondary hyperparathyroidism continue Renvela.  *Anxiety/pruritus Vistaril as needed   All the records are reviewed and case discussed with Care Management/Social  Workerr. Management plans discussed with the patient, family and they are in agreement.  CODE STATUS: Partial code  TOTAL TIME TAKING CARE OF THIS PATIENT: 69minutes.   POSSIBLE D/C IN 1-2DAYS, DEPENDING ON CLINICAL CONDITION.  Note: This dictation was prepared with Dragon dictation along with smaller phrase technology. Any transcriptional errors that result from this process are unintentional.   Nicholes Mango M.D on 09/03/2017 at 4:13 PM  Between 7am to 6pm - Pager - [phone] After 6pm go to [email or web address] - password [email or web address] [facility]  [facility]  Office  [phone]  CC: Primary care physician; Kirk Ruths, MD

## 2017-09-03 NOTE — Progress Notes (Signed)
Patient has been anxious periodically  throughout the night. Patient was having a panic attack while VS being taken. Due to patient's poor circulation- pulse ox was not able to read level correctly- was reading  at 76%- called a rapid response for additional support and a fresh evaluation of patient. Patient's oxygen level read better on monitor. Rapid Response completed. Patient refusing all meds at this time. Nurse gave warm blanket for comfort. Patient spoke with Chaplain for extended time- pt calmed. Will continue to monitor.

## 2017-09-03 NOTE — Progress Notes (Signed)
   09/03/17 2100  Clinical Encounter Type  Visited With Patient  Visit Type Spiritual support;Follow-up  Referral From Nurse  Spiritual Encounters  Spiritual Needs Emotional  Patient requested [facility] visit: [facility] visited with patient; patient does not want to be alone and is anxious: [facility] offered emotional and spiritual support.

## 2017-09-03 NOTE — Progress Notes (Signed)
Pre dialysis assessment 

## 2017-09-03 NOTE — Progress Notes (Signed)
[facility]  ROUNDING NOTE   Subjective:   Seen and examined on hemodialysis. Unfortunately after 30 minutes of treatment, she pulled her needle.   Objective:  Vital signs in last 24 hours:  Temp:  [97 F (36.1 C)] 97 F (36.1 C) (08/09 1115) Pulse Rate:  [33-72] 70 (08/09 1115) Resp:  [15-23] 18 (08/09 1130) BP: (103-142)/(54-96) 119/68 (08/09 1130) SpO2:  [74 %-100 %] 98 % (08/09 1030)  Weight change:  Filed Weights   08/30/17 2037 09/01/17 0953 09/01/17 1344  Weight: 49.2 kg 50.5 kg 48.3 kg    Intake/Output: I/O last 3 completed shifts: In: 2 [IV Piggyback:2] Out: -    Intake/Output this shift:  Total I/O In: -  Out: -176   Physical Exam: General: NAD   Head: Normocephalic, atraumatic. Moist oral mucosal membranes  Eyes: Anicteric, PERRL  Neck: Supple, trachea midline  Lungs:  Clear   Heart: Regular rate and rhythm  Abdomen:  Soft, nontender  Extremities:  no peripheral edema. Left UNNA boot  Neurologic: Nonfocal, moving all four extremities  Skin: +lacerations, +erythema  Access: Left AVF    Basic Metabolic Panel: Recent Labs  Lab 08/30/17 1222 09/01/17 1156  NA 138 138  K 3.8 4.1  CL 96* 99  CO2 27 24  GLUCOSE 117* 218*  BUN 64* 60*  CREATININE 5.18* 4.80*  CALCIUM 8.9 8.7*  PHOS  --  6.5*    Liver Function Tests: Recent Labs  Lab 08/30/17 1222 09/01/17 1156  AST 28  --   ALT 16  --   ALKPHOS 113  --   BILITOT 1.2  --   PROT 7.6  --   ALBUMIN 3.7 3.5   No results for input(s): LIPASE, AMYLASE in the last 168 hours. No results for input(s): AMMONIA in the last 168 hours.  CBC: Recent Labs  Lab 08/30/17 1222 09/01/17 1156  WBC 6.7 6.1  NEUTROABS 5.2  --   HGB 11.5* 11.7*  HCT 34.6* 35.7  MCV 97.2 96.5  PLT 186 209    Cardiac Enzymes: No results for input(s): CKTOTAL, CKMB, CKMBINDEX, TROPONINI in the last 168 hours.  BNP: Invalid input(s): POCBNP  CBG: Recent Labs  Lab 08/31/17 0859  GLUCAP 80     Microbiology: Results for orders placed or performed during the hospital encounter of 08/30/17  Blood culture (routine x 2)     Status: None (Preliminary result)   Collection Time: 08/30/17 12:22 PM  Result Value Ref Range Status   Specimen Description BLOOD BLOOD LEFT WRIST  Final   Special Requests   Final    BOTTLES DRAWN AEROBIC AND ANAEROBIC Blood Culture adequate volume   Culture   Final    NO GROWTH 4 DAYS Performed at [facility], [address] [place], [place] [address]    Report Status PENDING  Incomplete  Blood culture (routine x 2)     Status: None (Preliminary result)   Collection Time: 08/30/17 12:27 PM  Result Value Ref Range Status   Specimen Description BLOOD BLOOD LEFT FOREARM  Final   Special Requests   Final    BOTTLES DRAWN AEROBIC AND ANAEROBIC Blood Culture adequate volume   Culture   Final    NO GROWTH 4 DAYS Performed at [facility], [address], [place], [place] [address]    Report Status PENDING  Incomplete  MRSA PCR Screening     Status: Abnormal   Collection Time: 08/30/17  9:21 PM  Result Value  Ref Range Status   MRSA by PCR POSITIVE (A) NEGATIVE Final    Comment:        The GeneXpert MRSA Assay (FDA approved for NASAL specimens only), is one component of a comprehensive MRSA colonization surveillance program. It is not intended to diagnose MRSA infection nor to guide or monitor treatment for MRSA infections. C/Sharon [name] @2330  08/30/17 [facility] Performed at [facility], [address] [place], [place] [address]     Coagulation Studies: No results for input(s): LABPROT, INR in the last 72 hours.  Urinalysis: No results for input(s): COLORURINE, LABSPEC, PHURINE, GLUCOSEU, HGBUR, BILIRUBINUR, KETONESUR, PROTEINUR, UROBILINOGEN, NITRITE, LEUKOCYTESUR in the last 72 hours.  Invalid input(s): APPERANCEUR    Imaging: No results found.   Medications:   . cefTRIAXone (ROCEPHIN)  IV Stopped  (09/02/17 1800)   . Chlorhexidine Gluconate Cloth  6 each Topical Q0600  . Chlorhexidine Gluconate Cloth  6 each Topical Q0600  . feeding supplement  1 Container Oral TID BM  . feeding supplement (PRO-STAT SUGAR FREE 64)  30 mL Oral BID  . heparin  5,000 Units Subcutaneous Q12H  . latanoprost  1 drop Both Eyes QHS  . levothyroxine  25 mcg Oral QAC breakfast  . metoprolol succinate  25 mg Oral Daily  . multivitamin  1 tablet Oral QHS  . multivitamin-lutein  1 capsule Oral Daily  . mupirocin ointment  1 application Nasal BID  . sevelamer carbonate  800 mg Oral TID WC  . vitamin C  250 mg Oral BID   acetaminophen **OR** acetaminophen, hydrOXYzine, ondansetron **OR** ondansetron (ZOFRAN) IV, traMADol  Assessment/ Plan:  Sharon [name] is a 84 y.o. white female with end stage renal disease on hemodialysis, coronary artery disease, pacemaker placement, meniere's disease, congestive heart failure, peripheral vascular disease, multiple falls and fractures.   MWF [facility] Sharon [name] permcath/left AVF EDW 46.5kg.   1. End-stage renal disease: unable to complete hemodialysis treatment.  - Monitor for dialysis need over the weekend.   2. Anemia of chronic kidney disease:   - epo as outpatient.   3. Secondary hyperparathyroidism with hyperphosphatemia: PTH not well controlled outpatient labs: 7/8: PTH 1349, calcium 8.5, phosphorus 5.8 - sevelamer with meals  4. Hypertension: blood pressure is at goal   - metoprolol  Consider palliative care consult    LOS: 4 Sharon [name] 8/9/201911:35 AM

## 2017-09-03 NOTE — Progress Notes (Signed)
This note also relates to the following rows which could not be included: Pulse Rate - Cannot attach notes to unvalidated device data Resp - Cannot attach notes to unvalidated device data BP - Cannot attach notes to unvalidated device data  Hd Discontinued due to extravasation of venous aspect of AVF.  Dr Juleen China notifiedd.

## 2017-09-03 NOTE — Progress Notes (Signed)
Post dialysis assessment 

## 2017-09-04 LAB — CBC
HEMATOCRIT: 37.3 % (ref 35.0–47.0)
HEMOGLOBIN: 12 g/dL (ref 12.0–16.0)
MCH: 31.4 pg (ref 26.0–34.0)
MCHC: 32.1 g/dL (ref 32.0–36.0)
MCV: 98 fL (ref 80.0–100.0)
Platelets: 206 10*3/uL (ref 150–440)
RBC: 3.81 MIL/uL (ref 3.80–5.20)
RDW: 15.6 % — ABNORMAL HIGH (ref 11.5–14.5)
WBC: 6.3 10*3/uL (ref 3.6–11.0)

## 2017-09-04 LAB — RENAL FUNCTION PANEL
ANION GAP: 14 (ref 5–15)
Albumin: 3.6 g/dL (ref 3.5–5.0)
BUN: 66 mg/dL — ABNORMAL HIGH (ref 8–23)
CHLORIDE: 100 mmol/L (ref 98–111)
CO2: 27 mmol/L (ref 22–32)
Calcium: 8.8 mg/dL — ABNORMAL LOW (ref 8.9–10.3)
Creatinine, Ser: 5.05 mg/dL — ABNORMAL HIGH (ref 0.44–1.00)
GFR calc Af Amer: 8 mL/min — ABNORMAL LOW (ref >60)
GFR calc non Af Amer: 7 mL/min — ABNORMAL LOW (ref >60)
GLUCOSE: 111 mg/dL — AB (ref 70–99)
Phosphorus: 7.7 mg/dL — ABNORMAL HIGH (ref 2.5–4.6)
Potassium: 5.2 mmol/L — ABNORMAL HIGH (ref 3.5–5.1)
Sodium: 141 mmol/L (ref 135–145)

## 2017-09-04 LAB — CULTURE, BLOOD (ROUTINE X 2)
CULTURE: NO GROWTH
Culture: NO GROWTH
SPECIAL REQUESTS: ADEQUATE
Special Requests: ADEQUATE

## 2017-09-04 NOTE — Progress Notes (Signed)
This note also relates to the following rows which could not be included: Resp - Cannot attach notes to unvalidated device data BP - Cannot attach notes to unvalidated device data  Hd completed  

## 2017-09-04 NOTE — Progress Notes (Signed)
This note also relates to the following rows which could not be included: Pulse Rate - Cannot attach notes to unvalidated device data Resp - Cannot attach notes to unvalidated device data BP - Cannot attach notes to unvalidated device data  Hd started  

## 2017-09-04 NOTE — Progress Notes (Signed)
[facility]  ROUNDING NOTE   Subjective:   Patient states, "I don't want to die."  Patient did not complete her hemodialysis treatment yesterday, only got 30 minutes before she pulled her needle out.   Patient feels short of breath. She states this is because she is anxious.   Patient wants to try dialysis again today.   Patient states she feels lonely and wants her family to be with her.   Objective:  Vital signs in last 24 hours:  Temp:  [97 F (36.1 C)-97.8 F (36.6 C)] 97.4 F (36.3 C) (08/10 0456) Pulse Rate:  [33-73] 65 (08/10 0827) Resp:  [15-23] 20 (08/10 0456) BP: (103-142)/(54-96) 117/64 (08/10 0827) SpO2:  [56 %-100 %] 56 % (08/10 0827) Weight:  [47.8 kg] 47.8 kg (08/10 0456)  Weight change:  Filed Weights   09/01/17 0953 09/01/17 1344 09/04/17 0456  Weight: 50.5 kg 48.3 kg 47.8 kg    Intake/Output: I/O last 3 completed shifts: In: 0  Out: -176    Intake/Output this shift:  No intake/output data recorded.  Physical Exam: General: NAD   Head: Normocephalic, atraumatic. Moist oral mucosal membranes  Eyes: Anicteric, PERRL  Neck: Supple, trachea midline  Lungs:  Clear   Heart: Regular rate and rhythm  Abdomen:  Soft, nontender  Extremities:  no peripheral edema.   Neurologic: Nonfocal, moving all four extremities  Skin: +lacerations, +erythema  Access: Left AVF    Basic Metabolic Panel: Recent Labs  Lab 08/30/17 1222 09/01/17 1156  NA 138 138  K 3.8 4.1  CL 96* 99  CO2 27 24  GLUCOSE 117* 218*  BUN 64* 60*  CREATININE 5.18* 4.80*  CALCIUM 8.9 8.7*  PHOS  --  6.5*    Liver Function Tests: Recent Labs  Lab 08/30/17 1222 09/01/17 1156  AST 28  --   ALT 16  --   ALKPHOS 113  --   BILITOT 1.2  --   PROT 7.6  --   ALBUMIN 3.7 3.5   No results for input(s): LIPASE, AMYLASE in the last 168 hours. No results for input(s): AMMONIA in the last 168 hours.  CBC: Recent Labs  Lab 08/30/17 1222 09/01/17 1156  WBC 6.7  6.1  NEUTROABS 5.2  --   HGB 11.5* 11.7*  HCT 34.6* 35.7  MCV 97.2 96.5  PLT 186 209    Cardiac Enzymes: No results for input(s): CKTOTAL, CKMB, CKMBINDEX, TROPONINI in the last 168 hours.  BNP: Invalid input(s): POCBNP  CBG: Recent Labs  Lab 08/31/17 0859  GLUCAP 27    Microbiology: Results for orders placed or performed during the hospital encounter of 08/30/17  Blood culture (routine x 2)     Status: None   Collection Time: 08/30/17 12:22 PM  Result Value Ref Range Status   Specimen Description BLOOD BLOOD LEFT WRIST  Final   Special Requests   Final    BOTTLES DRAWN AEROBIC AND ANAEROBIC Blood Culture adequate volume   Culture   Final    NO GROWTH 5 DAYS Performed at [facility], [address], [place], [place] [address]    Report Status 09/04/2017 FINAL  Final  Blood culture (routine x 2)     Status: None   Collection Time: 08/30/17 12:27 PM  Result Value Ref Range Status   Specimen Description BLOOD BLOOD LEFT FOREARM  Final   Special Requests   Final    BOTTLES DRAWN AEROBIC AND ANAEROBIC Blood Culture adequate volume   Culture  Final    NO GROWTH 5 DAYS Performed at [facility], [address] [place], [place] [address]    Report Status 09/04/2017 FINAL  Final  MRSA PCR Screening     Status: Abnormal   Collection Time: 08/30/17  9:21 PM  Result Value Ref Range Status   MRSA by PCR POSITIVE (A) NEGATIVE Final    Comment:        The GeneXpert MRSA Assay (FDA approved for NASAL specimens only), is one component of a comprehensive MRSA colonization surveillance program. It is not intended to diagnose MRSA infection nor to guide or monitor treatment for MRSA infections. C/KAREEMA HARRIS @2330  08/30/17 [facility] Performed at [facility], [address] [place], [place] [address]     Coagulation Studies: No results for input(s): LABPROT, INR in the last 72 hours.  Urinalysis: No results for input(s): COLORURINE,  LABSPEC, PHURINE, GLUCOSEU, HGBUR, BILIRUBINUR, KETONESUR, PROTEINUR, UROBILINOGEN, NITRITE, LEUKOCYTESUR in the last 72 hours.  Invalid input(s): APPERANCEUR    Imaging: No results found.   Medications:   . cefTRIAXone (ROCEPHIN)  IV 1 g (09/03/17 1803)   . Chlorhexidine Gluconate Cloth  6 each Topical Q0600  . feeding supplement  1 Container Oral TID BM  . feeding supplement (PRO-STAT SUGAR FREE 64)  30 mL Oral BID  . heparin  5,000 Units Subcutaneous Q12H  . latanoprost  1 drop Both Eyes QHS  . levothyroxine  25 mcg Oral QAC breakfast  . metoprolol succinate  25 mg Oral Daily  . multivitamin  1 tablet Oral QHS  . multivitamin-lutein  1 capsule Oral Daily  . mupirocin ointment  1 application Nasal BID  . sevelamer carbonate  800 mg Oral TID WC  . vitamin C  250 mg Oral BID   acetaminophen **OR** acetaminophen, hydrOXYzine, ondansetron **OR** ondansetron (ZOFRAN) IV, traMADol  Assessment/ Plan:  [name] is a 84 y.o. white female with end stage renal disease on hemodialysis, coronary artery disease, pacemaker placement, meniere's disease, congestive heart failure, peripheral vascular disease, multiple falls and fractures.   MWF [facility] permcath/left AVF EDW 46.5kg.   1. End-stage renal disease: unable to complete hemodialysis treatment yesterday.  - Dialysis today, patient states she wants to continue dialysis for now.  - Hemodialysis orders prepared.   2. Anemia of chronic kidney disease:   - epo as outpatient.  - Check CBC  3. Secondary hyperparathyroidism with hyperphosphatemia: PTH not well controlled outpatient labs: 7/8: PTH 1349, calcium 8.5, phosphorus 5.8 - sevelamer with meals - patient refused to eat this morning.   4. Hypertension: blood pressure is at goal   - metoprolol  Appreciate palliative care consult . Patient remains partial code with no intubation but with CPR. If dialysis is not successful today, we will move  toward considering comfort measures.    LOS: 5 Jalee Saine 8/10/20198:41 AM

## 2017-09-04 NOTE — Progress Notes (Signed)
[place] [facility] [place] [name]    MR#:  [id number]  DATE OF BIRTH:  [birth year]  SUBJECTIVE:  CHIEF COMPLAINT: Patient a short of breath and wants to get hemodialysis today.  Refusing anxiety medicine and narcotics  Brother Jeralene Peters  at bedside , aware of the situation  seen by palliative care, discussed with patient's brother who is the healthcare power of attorney.  Patient reports that she is tired  REVIEW OF SYSTEMS:  CONSTITUTIONAL: No fever, fatigue or weakness.  EYES: No blurred or double vision.  EARS, NOSE, AND THROAT: No tinnitus or ear pain.  RESPIRATORY: No cough, shortness of breath, wheezing or hemoptysis.  CARDIOVASCULAR: No chest pain, orthopnea, edema.  GASTROINTESTINAL: No nausea, vomiting, diarrhea or abdominal pain.  GENITOURINARY: No dysuria, hematuria.  ENDOCRINE: No polyuria, nocturia,  HEMATOLOGY: No anemia, easy bruising or bleeding SKIN: Right anterior leg healing laceration, leg swelling/redness  MUSCULOSKELETAL: No joint pain or arthritis.   NEUROLOGIC: No tingling, numbness, weakness.  PSYCHIATRY: No anxiety or depression.   DRUG ALLERGIES:   Allergies  Allergen Reactions  . Statins Other (See Comments)    Muscle weakness severe  . Codeine Sulfate Nausea And Vomiting  . Codeine Other (See Comments)    GI UPSET  . Effexor [Venlafaxine] Nausea Only  . Ezetimibe-Simvastatin Other (See Comments)    Muscle weakness    VITALS:  Blood pressure 119/68, pulse (!) 42, temperature 98.2 F (36.8 C), temperature source Axillary, resp. rate 17, height 5\' 4"  (1.626 m), weight 47.8 kg, SpO2 (!) 83 %.  PHYSICAL EXAMINATION:  GENERAL:  84 y.o.-year-old patient lying in the bed with no acute distress.  EYES: Pupils equal, round, reactive to light and accommodation. No scleral icterus. Extraocular muscles intact.  HEENT: Head atraumatic, normocephalic. Oropharynx and nasopharynx clear.  NECK:   Supple, no jugular venous distention. No thyroid enlargement, no tenderness.  LUNGS: Normal breath sounds bilaterally, no wheezing, rales,rhonchi or crepitation. No use of accessory muscles of respiration.  CARDIOVASCULAR: S1, S2 normal. No murmurs, rubs, or gallops.  ABDOMEN: Soft, nontender, nondistended. Bowel sounds present.  EXTREMITIES: No  clubbing.  Cyanotic peripheries NEUROLOGIC: Awake and alert, oriented x3  Gait not checked.  PSYCHIATRIC: The patient is alert and oriented x 3.  SKIN:  Right leg healing laceration, left leg dusky purplish erythema is getting worse with acute erythema of the foot, bilateral lower extremity dusky, cold to touch peripheral pulses 1+      LABORATORY PANEL:   CBC Recent Labs  Lab 09/01/17 1156  WBC 6.1  HGB 11.7*  HCT 35.7  PLT 209   ------------------------------------------------------------------------------------------------------------------  Chemistries  Recent Labs  Lab 08/30/17 1222 09/01/17 1156  NA 138 138  K 3.8 4.1  CL 96* 99  CO2 27 24  GLUCOSE 117* 218*  BUN 64* 60*  CREATININE 5.18* 4.80*  CALCIUM 8.9 8.7*  AST 28  --   ALT 16  --   ALKPHOS 113  --   BILITOT 1.2  --    ------------------------------------------------------------------------------------------------------------------  Cardiac Enzymes No results for input(s): TROPONINI in the last 168 hours. ------------------------------------------------------------------------------------------------------------------  RADIOLOGY:  No results found.  EKG:   Orders placed or performed during the hospital encounter of 06/28/17  . ED EKG  . ED EKG  . EKG    ASSESSMENT AND PLAN:    84 year old female with past medical history of ESRD - HD M/W/F, OSA, dementia, history of recurrent falls, CAD, ICM  w/ EF 58-52%, chronic systolic CHF, secondary hyperparathyroidism who presents to the hospital with right lower extremity cellulitis    *Failure to  thrive Palliative care consulted, had a meeting with patient's brother healthcare power of attorney, who has not requested to give her 1 or 2 daytime through the weekend if no clinical improvement he will consider hospice care  *Acute right lower extremity cellulitis  -Blood cultures are negative so far.  MRSA by PCR is positive Secondary to acute right leg laceration, noted history of frequent falling events Admit to regular nursing floor bed, cellulitis protocol  IV Rocephin  Encouraged to keep legs elevated Patient could not tolerate Unna boot at this time requesting to remove the [employer] boot  *Chronic end-stage renal disease on hemodialysis M/W/F Nephrology consulted for hemodialysis needs as patient is due for hemodialysis yesterday  *Acute bilateral lower extremities peripheral vascular disease and chronic venous changes Consult vascular surgery seen by Dr.   Lucky Cowboy, patient has had both arterial and venous studies done within the past several months which were normal and she has a strong palpable pulses vascular is recommending to continue 2 weeks of outpatient oral antibiotics and compression wraps such as Unna boot but patient could not tolerate Unna boot Not recommending any vascular interventions at this time Discussed with Dr. Lucky Cowboy 09/02/2017 , reevaluated by vascular PA who has offered CTA if patient is willing , but patient is refusing everything today Discussed with the patient and patient's brother at bedside, refusing any surgery is aware that patient is not a good surgical candidate  *Chronic diabetes mellitus type 2 Sliding scale insulin with Accu-Cheks per routine   *Chronic benign essential hypertension  Stable  Continue home regiment   *Chronic systolic congestive heart failure with cardiomyopathy ejection fraction 20-25% Mild exacerbation due to end-stage renal disease in need of hemodialysis Continue metoprolol, supplemental oxygen wean as tolerated  *Chronic  hypothyroidism  Stable  continue synthroid  *Chronic secondary hyperparathyroidism continue Renvela.  *Anxiety/pruritus Vistaril as needed   All the records are reviewed and case discussed with Care Management/Social Workerr. Management plans discussed with the patient, family and they are in agreement.  CODE STATUS: Partial code  TOTAL TIME TAKING CARE OF THIS PATIENT: 47minutes.  More than 50% time was spent on coordination of care  POSSIBLE D/C IN 2DAYS, DEPENDING ON CLINICAL CONDITION.  Note: This dictation was prepared with Dragon dictation along with smaller phrase technology. Any transcriptional errors that result from this process are unintentional.   Nicholes Mango M.D on 09/04/2017 at 1:31 PM  Between 7am to 6pm - Pager - [phone] After 6pm go to [email or web address] - password [email or web address] [facility]  [facility]  Office  [phone]  CC: Primary care physician; Kirk Ruths, MD

## 2017-09-04 NOTE — Progress Notes (Signed)
Pre dialysis assessment 

## 2017-09-05 MED ORDER — SEVELAMER CARBONATE 2.4 G PO PACK
2.4000 g | PACK | Freq: Three times a day (TID) | ORAL | Status: DC
  Administered 2017-09-05 – 2017-09-07 (×4): 2.4 g via ORAL
  Filled 2017-09-05 (×7): qty 1

## 2017-09-05 NOTE — Progress Notes (Signed)
[place] [facility] [place] [name]    MR#:  [id number]  DATE OF BIRTH:  03-20-1926  SUBJECTIVE:  CHIEF COMPLAINT: Patient had hemodialysis yesterday but discontinued treatment after 2 hours due to anxiety ,refusing anxiety medicine and narcotics , portable fan helps patient's air hunger Brother Jeralene Peters  at bedside , aware of the situation  seen by palliative care, discussed with patient's brother who is the healthcare power of attorney.  Patient reports that she is tired  REVIEW OF SYSTEMS:  CONSTITUTIONAL: No fever, fatigue or weakness.  EYES: No blurred or double vision.  EARS, NOSE, AND THROAT: No tinnitus or ear pain.  RESPIRATORY: No cough, shortness of breath, wheezing or hemoptysis.  CARDIOVASCULAR: No chest pain, orthopnea, edema.  GASTROINTESTINAL: No nausea, vomiting, diarrhea or abdominal pain.  GENITOURINARY: No dysuria, hematuria.  ENDOCRINE: No polyuria, nocturia,  HEMATOLOGY: No anemia, easy bruising or bleeding SKIN: Right anterior leg healing laceration, leg swelling/redness  MUSCULOSKELETAL: No joint pain or arthritis.   NEUROLOGIC: No tingling, numbness, weakness.  PSYCHIATRY: No anxiety or depression.   DRUG ALLERGIES:   Allergies  Allergen Reactions  . Statins Other (See Comments)    Muscle weakness severe  . Codeine Sulfate Nausea And Vomiting  . Codeine Other (See Comments)    GI UPSET  . Effexor [Venlafaxine] Nausea Only  . Ezetimibe-Simvastatin Other (See Comments)    Muscle weakness    VITALS:  Blood pressure 126/71, pulse 70, temperature 97.7 F (36.5 C), temperature source Oral, resp. rate 18, height 5\' 4"  (1.626 m), weight 49.9 kg, SpO2 100 %.  PHYSICAL EXAMINATION:  GENERAL:  83 y.o.-year-old patient lying in the bed with no acute distress.  EYES: Pupils equal, round, reactive to light and accommodation. No scleral icterus. Extraocular muscles intact.  HEENT: Head atraumatic,  normocephalic. Oropharynx and nasopharynx clear.  NECK:  Supple, no jugular venous distention. No thyroid enlargement, no tenderness.  LUNGS: Normal breath sounds bilaterally, no wheezing, rales,rhonchi or crepitation. No use of accessory muscles of respiration.  CARDIOVASCULAR: S1, S2 normal. No murmurs, rubs, or gallops.  ABDOMEN: Soft, nontender, nondistended. Bowel sounds present.  EXTREMITIES: No  clubbing.  Cyanotic peripheries NEUROLOGIC: Awake and alert, oriented x3  Gait not checked.  PSYCHIATRIC: The patient is alert and oriented x 3.  SKIN:  Right leg healing laceration, left leg dusky purplish erythema is getting worse with acute erythema of the foot, bilateral lower extremity dusky, cold to touch peripheral pulses 1+      LABORATORY PANEL:   CBC Recent Labs  Lab 09/04/17 1310  WBC 6.3  HGB 12.0  HCT 37.3  PLT 206   ------------------------------------------------------------------------------------------------------------------  Chemistries  Recent Labs  Lab 08/30/17 1222  09/04/17 1310  NA 138   < > 141  K 3.8   < > 5.2*  CL 96*   < > 100  CO2 27   < > 27  GLUCOSE 117*   < > 111*  BUN 64*   < > 66*  CREATININE 5.18*   < > 5.05*  CALCIUM 8.9   < > 8.8*  AST 28  --   --   ALT 16  --   --   ALKPHOS 113  --   --   BILITOT 1.2  --   --    < > = values in this interval not displayed.   ------------------------------------------------------------------------------------------------------------------  Cardiac Enzymes No results for input(s): TROPONINI in the last 168  hours. ------------------------------------------------------------------------------------------------------------------  RADIOLOGY:  No results found.  EKG:   Orders placed or performed during the hospital encounter of 06/28/17  . ED EKG  . ED EKG  . EKG    ASSESSMENT AND PLAN:    84 year old female with past medical history of ESRD - HD M/W/F, OSA, dementia, history of recurrent  falls, CAD, ICM w/ EF 56-38%, chronic systolic CHF, secondary hyperparathyroidism who presents to the hospital with right lower extremity cellulitis    *Failure to thrive Palliative care consulted, had a meeting with patient's brother healthcare power of attorney, who has not requested to give her 1 or 2 daytime through the weekend if no clinical improvement he will consider hospice care  *Acute right lower extremity cellulitis  -Doing okay -Blood cultures are negative so far.  MRSA by PCR is positive Secondary to acute right leg laceration, noted history of frequent falling events Admit to regular nursing floor bed, cellulitis protocol  IV Rocephin  Encouraged to keep legs elevated Patient could not tolerate Unna boot at this time requesting to remove the [employer] boot  *Chronic end-stage renal disease on hemodialysis M/W/F Nephrology consulted for hemodialysis needs as patient is due for hemodialysis yesterday  *Acute bilateral lower extremities peripheral vascular disease and chronic venous changes Consult vascular surgery seen by Dr.   Lucky Cowboy, patient has had both arterial and venous studies done within the past several months which were normal and she has a strong palpable pulses vascular is recommending to continue 2 weeks of outpatient oral antibiotics and compression wraps such as Unna boot but patient could not tolerate Unna boot Not recommending any vascular interventions at this time Discussed with Dr. Lucky Cowboy 09/02/2017 , reevaluated by vascular PA who has offered CTA if patient is willing , but patient is refusing everything today Discussed with the patient and patient's brother at bedside, refusing any surgery is aware that patient is not a good surgical candidate  *Chronic diabetes mellitus type 2 Sliding scale insulin with Accu-Cheks per routine   *Chronic benign essential hypertension  Stable  Continue home regiment   *Chronic systolic congestive heart failure with  cardiomyopathy ejection fraction 20-25% Mild exacerbation due to end-stage renal disease in need of hemodialysis Continue metoprolol, supplemental oxygen wean as tolerated  *Chronic hypothyroidism  Stable  continue synthroid  *Chronic secondary hyperparathyroidism continue Renvela.  *Anxiety/pruritus Vistaril as needed   All the records are reviewed and case discussed with Care Management/Social Workerr. Management plans discussed with the patient, family and they are in agreement.  CODE STATUS: Partial code  TOTAL TIME TAKING CARE OF THIS PATIENT: 49minutes.  More than 50% time was spent on coordination of care  POSSIBLE D/C IN 2DAYS, DEPENDING ON CLINICAL CONDITION.  Note: This dictation was prepared with Dragon dictation along with smaller phrase technology. Any transcriptional errors that result from this process are unintentional.   Nicholes Mango M.D on 09/05/2017 at 1:04 PM  Between 7am to 6pm - Pager - [phone] After 6pm go to [email or web address] - password [email or web address] [facility]  [facility]  Office  [phone]  CC: Primary care physician; Kirk Ruths, MD

## 2017-09-05 NOTE — Progress Notes (Signed)
[facility]  ROUNDING NOTE   Subjective:   Hemodialysis treatment yesterday. Discontinued treatment after 2 hours due to anxiety.   UF of 531mL  Patient having difficulty with taking her pills. Placed on dysphagia diet.  Objective:  Vital signs in last 24 hours:  Temp:  [94.5 F (34.7 C)-98.7 F (37.1 C)] 98.1 F (36.7 C) (08/11 0409) Pulse Rate:  [42-77] 77 (08/11 0409) Resp:  [11-32] 18 (08/11 0409) BP: (108-146)/(51-101) 123/84 (08/11 0409) SpO2:  [83 %-100 %] 100 % (08/11 0409) Weight:  [49.9 kg] 49.9 kg (08/11 0409)  Weight change: 2.096 kg Filed Weights   09/01/17 1344 09/04/17 0456 09/05/17 0409  Weight: 48.3 kg 47.8 kg 49.9 kg    Intake/Output: I/O last 3 completed shifts: In: 0  Out: 500 [Other:500]   Intake/Output this shift:  No intake/output data recorded.  Physical Exam: General: NAD   Head: +hard of hearing, Moist oral mucosal membranes  Eyes: Anicteric, PERRL  Neck: Supple, trachea midline  Lungs:  Clear   Heart: Regular rate and rhythm  Abdomen:  Soft, nontender  Extremities:  no peripheral edema.   Neurologic: Nonfocal, moving all four extremities  Skin: +lacerations, +erythema  Access: Left AVF    Basic Metabolic Panel: Recent Labs  Lab 08/30/17 1222 09/01/17 1156 09/04/17 1310  NA 138 138 141  K 3.8 4.1 5.2*  CL 96* 99 100  CO2 27 24 27   GLUCOSE 117* 218* 111*  BUN 64* 60* 66*  CREATININE 5.18* 4.80* 5.05*  CALCIUM 8.9 8.7* 8.8*  PHOS  --  6.5* 7.7*    Liver Function Tests: Recent Labs  Lab 08/30/17 1222 09/01/17 1156 09/04/17 1310  AST 28  --   --   ALT 16  --   --   ALKPHOS 113  --   --   BILITOT 1.2  --   --   PROT 7.6  --   --   ALBUMIN 3.7 3.5 3.6   No results for input(s): LIPASE, AMYLASE in the last 168 hours. No results for input(s): AMMONIA in the last 168 hours.  CBC: Recent Labs  Lab 08/30/17 1222 09/01/17 1156 09/04/17 1310  WBC 6.7 6.1 6.3  NEUTROABS 5.2  --   --   HGB 11.5* 11.7*  12.0  HCT 34.6* 35.7 37.3  MCV 97.2 96.5 98.0  PLT 186 209 206    Cardiac Enzymes: No results for input(s): CKTOTAL, CKMB, CKMBINDEX, TROPONINI in the last 168 hours.  BNP: Invalid input(s): POCBNP  CBG: Recent Labs  Lab 08/31/17 0859  GLUCAP 29    Microbiology: Results for orders placed or performed during the hospital encounter of 08/30/17  Blood culture (routine x 2)     Status: None   Collection Time: 08/30/17 12:22 PM  Result Value Ref Range Status   Specimen Description BLOOD BLOOD LEFT WRIST  Final   Special Requests   Final    BOTTLES DRAWN AEROBIC AND ANAEROBIC Blood Culture adequate volume   Culture   Final    NO GROWTH 5 DAYS Performed at [facility], [address], [place], [place] [address]    Report Status 09/04/2017 FINAL  Final  Blood culture (routine x 2)     Status: None   Collection Time: 08/30/17 12:27 PM  Result Value Ref Range Status   Specimen Description BLOOD BLOOD LEFT FOREARM  Final   Special Requests   Final    BOTTLES DRAWN AEROBIC AND ANAEROBIC Blood Culture adequate volume  Culture   Final    NO GROWTH 5 DAYS Performed at [facility], [address] [place], [place] [address]    Report Status 09/04/2017 FINAL  Final  MRSA PCR Screening     Status: Abnormal   Collection Time: 08/30/17  9:21 PM  Result Value Ref Range Status   MRSA by PCR POSITIVE (A) NEGATIVE Final    Comment:        The GeneXpert MRSA Assay (FDA approved for NASAL specimens only), is one component of a comprehensive MRSA colonization surveillance program. It is not intended to diagnose MRSA infection nor to guide or monitor treatment for MRSA infections. C/KAREEMA HARRIS @2330  08/30/17 [facility] Performed at [facility], [address] [place], [place] [address]     Coagulation Studies: No results for input(s): LABPROT, INR in the last 72 hours.  Urinalysis: No results for input(s): COLORURINE, LABSPEC, PHURINE, GLUCOSEU,  HGBUR, BILIRUBINUR, KETONESUR, PROTEINUR, UROBILINOGEN, NITRITE, LEUKOCYTESUR in the last 72 hours.  Invalid input(s): APPERANCEUR    Imaging: No results found.   Medications:   . cefTRIAXone (ROCEPHIN)  IV Stopped (09/04/17 2015)   . feeding supplement  1 Container Oral TID BM  . feeding supplement (PRO-STAT SUGAR FREE 64)  30 mL Oral BID  . heparin  5,000 Units Subcutaneous Q12H  . latanoprost  1 drop Both Eyes QHS  . levothyroxine  25 mcg Oral QAC breakfast  . metoprolol succinate  25 mg Oral Daily  . multivitamin  1 tablet Oral QHS  . multivitamin-lutein  1 capsule Oral Daily  . sevelamer carbonate  800 mg Oral TID WC  . vitamin C  250 mg Oral BID   acetaminophen **OR** acetaminophen, hydrOXYzine, ondansetron **OR** ondansetron (ZOFRAN) IV, traMADol  Assessment/ Plan:  [name] is a 84 y.o. white female with end stage renal disease on hemodialysis, coronary artery disease, pacemaker placement, meniere's disease, congestive heart failure, peripheral vascular disease, multiple falls and fractures.   MWF [facility] permcath/left AVF EDW 46.5kg.   1. End-stage renal disease: unable to complete hemodialysis treatment yesterday.  Patient states she wants to continue dialysis today - Hemodialysis for Tuesday. No indication for dialysis today.   2. Anemia of chronic kidney disease: hemoglobin 12 - epo as outpatient.   3. Secondary hyperparathyroidism with hyperphosphatemia: PTH not well controlled outpatient labs: 7/8: PTH 1349, calcium 8.5, phosphorus 5.8 - sevelamer with meals - change to powder due to dysphagia  4. Hypertension: blood pressure is at goal   - metoprolol  Appreciate palliative care consult . Patient remains partial code with no intubation but with CPR. Brother is involved in care. At this time, patient states she wants to continue dialysis.    LOS: 6 Alwin Lanigan 8/11/201911:10 AM

## 2017-09-06 DIAGNOSIS — Z7189 Other specified counseling: Secondary | ICD-10-CM

## 2017-09-06 DIAGNOSIS — R627 Adult failure to thrive: Secondary | ICD-10-CM

## 2017-09-06 DIAGNOSIS — Z515 Encounter for palliative care: Secondary | ICD-10-CM

## 2017-09-06 MED ORDER — HYDROCORTISONE 2.5 % RE CREA
TOPICAL_CREAM | Freq: Two times a day (BID) | RECTAL | Status: DC
  Administered 2017-09-06: 1 via RECTAL
  Filled 2017-09-06: qty 28.35

## 2017-09-06 MED ORDER — CEFDINIR 300 MG PO CAPS
300.0000 mg | ORAL_CAPSULE | ORAL | Status: DC
  Administered 2017-09-06: 300 mg via ORAL
  Filled 2017-09-06: qty 1

## 2017-09-06 NOTE — Care Management Important Message (Signed)
Important Message  Patient Details  Name: [name] MRN: [id number] Date of Birth: 06/12/1926   Medicare Important Message Given:  Yes    Beverly Sessions, RN 09/06/2017, 4:48 PM

## 2017-09-06 NOTE — Progress Notes (Addendum)
HD Post Treatment   Patient tolerated treatment well without complaints and complications. Net UF 1026. Goal met.    09/06/17 1400  Vital Signs  Temp 97.6 F (36.4 C)  Temp Source Axillary  Pulse Rate 69  Pulse Rate Source Monitor  Resp 16  BP (!) 115/97  BP Location Left Arm  BP Method Automatic  Patient Position (if appropriate) Sitting  Oxygen Therapy  SpO2 100 %  O2 Device Nasal Cannula  O2 Flow Rate (L/min) 5 L/min  Dialysis Weight  Weight 51.1 kg  Type of Weight Post-Dialysis  Post-Hemodialysis Assessment  Rinseback Volume (mL) 250 mL  KECN 65.8 V  Dialyzer Clearance Lightly streaked  Duration of HD Treatment -hour(s) 3.5 hour(s)  Hemodialysis Intake (mL) 500 mL  UF Total -Machine (mL) 1526 mL  Net UF (mL) 1026 mL  Tolerated HD Treatment Yes  AVG/AVF Arterial Site Held (minutes) 10 minutes  AVG/AVF Venous Site Held (minutes) 10 minutes  Fistula / Graft Right Forearm Arteriovenous fistula  Placement Date/Time: 04/15/16 1341   Placed prior to admission: No  Orientation: Right  Access Location: Forearm  Access Type: Arteriovenous fistula  Site Condition No complications  Fistula / Graft Assessment Present;Thrill;Bruit  Status Deaccessed  Drainage Description None

## 2017-09-06 NOTE — Progress Notes (Signed)
HD Treatment Completed    09/06/17 1350  Vital Signs  Pulse Rate 64  Pulse Rate Source Monitor  Resp 15  BP 122/61  BP Location Left Arm  BP Method Automatic  Patient Position (if appropriate) Sitting  Oxygen Therapy  SpO2 100 %  O2 Device Nasal Cannula  O2 Flow Rate (L/min) 5 L/min  During Hemodialysis Assessment  Dialysis Fluid Bolus Normal Saline  Bolus Amount (mL) 250 mL  Intra-Hemodialysis Comments Tolerated well;Tx completed

## 2017-09-06 NOTE — Progress Notes (Signed)
Chapalin was rounding and followed up with pt. She still has some anxiety but is more calm. Her friend is at her bedside. Chaplain prayed and offered assurances to the Pt.    09/06/17 1500  Clinical Encounter Type  Visited With Patient and family together  Visit Type Spiritual support  Referral From [facility]

## 2017-09-06 NOTE — Progress Notes (Signed)
[place] at [place] [name]    MR#:  [id number]  DATE OF BIRTH:  1926/03/16  SUBJECTIVE:  CHIEF COMPLAINT: Patient had hemodialysis today , patient was seen after hemodialysis.  Hypotensive , awaiting palliative care follow-up   discussed with patient's brother yesterday who is the healthcare power of attorney.    REVIEW OF SYSTEMS:  CONSTITUTIONAL: No fever, fatigue or weakness.  EYES: No blurred or double vision.  EARS, NOSE, AND THROAT: No tinnitus or ear pain.  RESPIRATORY: No cough, shortness of breath, wheezing or hemoptysis.  CARDIOVASCULAR: No chest pain, orthopnea, edema.  GASTROINTESTINAL: No nausea, vomiting, diarrhea or abdominal pain.  GENITOURINARY: No dysuria, hematuria.  ENDOCRINE: No polyuria, nocturia,  HEMATOLOGY: No anemia, easy bruising or bleeding SKIN: Right anterior leg healing laceration, leg swelling/redness  MUSCULOSKELETAL: No joint pain or arthritis.   NEUROLOGIC: No tingling, numbness, weakness.  PSYCHIATRY: No anxiety or depression.   DRUG ALLERGIES:   Allergies  Allergen Reactions  . Statins Other (See Comments)    Muscle weakness severe  . Codeine Sulfate Nausea And Vomiting  . Codeine Other (See Comments)    GI UPSET  . Effexor [Venlafaxine] Nausea Only  . Ezetimibe-Simvastatin Other (See Comments)    Muscle weakness    VITALS:  Blood pressure (!) 87/66, pulse (!) 47, temperature 97.6 F (36.4 C), temperature source Axillary, resp. rate 16, height 5\' 4"  (1.626 m), weight 51.1 kg, SpO2 (!) 60 %.  PHYSICAL EXAMINATION:  GENERAL:  84 y.o.-year-old patient lying in the bed with no acute distress.  EYES: Pupils equal, round, reactive to light and accommodation. No scleral icterus. Extraocular muscles intact.  HEENT: Head atraumatic, normocephalic. Oropharynx and nasopharynx clear.  NECK:  Supple, no jugular venous distention. No thyroid enlargement, no tenderness.  LUNGS:  Normal breath sounds bilaterally, no wheezing, rales,rhonchi or crepitation. No use of accessory muscles of respiration.  CARDIOVASCULAR: S1, S2 normal. No murmurs, rubs, or gallops.  ABDOMEN: Soft, nontender, nondistended. Bowel sounds present.  EXTREMITIES: No  clubbing.  Cyanotic peripheries NEUROLOGIC: Awake and alert, oriented x3  Gait not checked.  PSYCHIATRIC: The patient is alert and oriented x 3.  SKIN:  Right leg healing laceration, left leg dusky purplish erythema is getting worse with acute erythema of the foot, bilateral lower extremity dusky, cold to touch peripheral pulses 1+      LABORATORY PANEL:   CBC Recent Labs  Lab 09/04/17 1310  WBC 6.3  HGB 12.0  HCT 37.3  PLT 206   ------------------------------------------------------------------------------------------------------------------  Chemistries  Recent Labs  Lab 09/04/17 1310  NA 141  K 5.2*  CL 100  CO2 27  GLUCOSE 111*  BUN 66*  CREATININE 5.05*  CALCIUM 8.8*   ------------------------------------------------------------------------------------------------------------------  Cardiac Enzymes No results for input(s): TROPONINI in the last 168 hours. ------------------------------------------------------------------------------------------------------------------  RADIOLOGY:  No results found.  EKG:   Orders placed or performed during the hospital encounter of 06/28/17  . ED EKG  . ED EKG  . EKG    ASSESSMENT AND PLAN:    84 year old female with past medical history of ESRD - HD M/W/F, OSA, dementia, history of recurrent falls, CAD, ICM w/ EF 44-96%, chronic systolic CHF, secondary hyperparathyroidism who presents to the hospital with right lower extremity cellulitis    *Failure to thrive Palliative care consulted, had a meeting with patient's brother healthcare power of attorney, palliative care will follow up with the patient and patient's brother today  *Acute right  lower  extremity cellulitis  -Doing okay -Blood cultures are negative so far.  MRSA by PCR is positive Secondary to acute right leg laceration, noted history of frequent falling events Admit to regular nursing floor bed, cellulitis protocol  IV Rocephin changed to p.o. Omnicef for total 14 days Encouraged to keep legs elevated Patient could not tolerate Unna boot at this time requesting to remove the [employer]  *Chronic end-stage renal disease on hemodialysis M/W/F Nephrology consulted for hemodialysis needs as patient is due for hemodialysis today  *Acute bilateral lower extremities peripheral vascular disease and chronic venous changes Consult vascular surgery seen by Dr.   Lucky Cowboy, patient has had both arterial and venous studies done within the past several months which were normal and she has a strong palpable pulses vascular is recommending to continue 2 weeks of outpatient oral antibiotics and compression wraps such as Unna boot but patient could not tolerate Unna boot Not recommending any vascular interventions at this time Discussed with Dr. Lucky Cowboy 09/02/2017 , reevaluated by vascular PA who has offered CTA if patient is willing , but patient is refusing everything today Discussed with the patient and patient's brother at bedside, refusing any surgery is aware that patient is not a good surgical candidate  *Chronic diabetes mellitus type 2 Sliding scale insulin with Accu-Cheks per routine   *Chronic benign essential hypertension  Stable  Continue home regiment   *Chronic systolic congestive heart failure with cardiomyopathy ejection fraction 20-25% Mild exacerbation due to end-stage renal disease in need of hemodialysis Continue metoprolol, supplemental oxygen wean as tolerated  *Chronic hypothyroidism  Stable  continue synthroid  *Chronic secondary hyperparathyroidism continue Renvela.  *Anxiety/pruritus Vistaril as needed   All the records are reviewed and case discussed with  Care Management/Social Workerr. Management plans discussed with the patient, family and they are in agreement.  Anticipating discharge to the facility with outpatient palliative care if patient does not agree with hospice  CODE STATUS: Partial code  TOTAL TIME TAKING CARE OF THIS PATIENT: 30minutes.  More than 50% time was spent on coordination of care  POSSIBLE D/C IN 1DAYS, DEPENDING ON CLINICAL CONDITION.  Note: This dictation was prepared with Dragon dictation along with smaller phrase technology. Any transcriptional errors that result from this process are unintentional.   Nicholes Mango M.D on 09/06/2017 at 3:30 PM  Between 7am to 6pm - Pager - [phone] After 6pm go to [email or web address] - password [email or web address] [facility]  [facility]  Office  [phone]  CC: Primary care physician; Kirk Ruths, MD

## 2017-09-06 NOTE — Progress Notes (Signed)
Pre HD    09/06/17 1022  Vital Signs  Temp 97.7 F (36.5 C)  Temp Source Axillary  Pulse Rate 69  Pulse Rate Source Monitor  Resp 16  BP (!) 125/110  BP Location Left Arm  BP Method Automatic  Patient Position (if appropriate) Sitting  Oxygen Therapy  SpO2 100 %  O2 Device Nasal Cannula  O2 Flow Rate (L/min) 5 L/min  Pain Assessment  Pain Scale 0-10  Pain Score 0  Dialysis Weight  Weight 52.1 kg  Type of Weight Pre-Dialysis  Time-Out for Hemodialysis  What Procedure? HD  Pt Identifiers(min of two) First/Last Name;MRN/Account#;Pt's DOB(use if MRN/Acct# not available  Correct Site? Yes  Correct Side? Yes  Correct Procedure? Yes  Consents Verified? Yes  Rad Studies Available? N/A  Safety Precautions Reviewed? Yes  Engineer, civil (consulting) Number  ([id number])  Station Number 4  UF/Alarm Test Passed  Conductivity: Meter 14  Conductivity: Machine  13.8  pH 7.4  Reverse Osmosis Main  Normal Saline Lot Number [id number]  Dialyzer Lot Number [id number]  Disposable Set Lot Number [id number]  Machine Temperature 96.8 F (36 C)  Musician and Audible Yes  Blood Lines Intact and Secured Yes  Pre Treatment Patient Checks  Vascular access used during treatment Fistula  Hepatitis B Surface Antigen Results Negative  Date Hepatitis B Surface Antigen Drawn 08/30/17  Isolation Initiated Yes  Hepatitis B Surface Antibody 10  Date Hepatitis B Surface Antibody Drawn 08/30/17  Hemodialysis Consent Verified Yes  Hemodialysis Standing Orders Initiated Yes  ECG (Telemetry) Monitor On Yes  Prime Ordered Normal Saline  Length of  DialysisTreatment -hour(s) 3 Hour(s)  Dialyzer Elisio 17H NR  Dialysate 3K, 2.5 Ca  Dialysis Anticoagulant None  Dialysate Flow Ordered 600  Blood Flow Rate Ordered 400 mL/min  Ultrafiltration Goal 1 Liters  Ultrafiltration Profile Other (Comment)  Pre Treatment Labs Renal panel;CBC  Dialysis Blood Pressure Support Ordered Normal Saline  Education / Care  Plan  Dialysis Education Provided Yes  Documented Education in Care Plan Yes  Fistula / Graft Right Forearm Arteriovenous fistula  Placement Date/Time: 04/15/16 1341   Placed prior to admission: No  Orientation: Right  Access Location: Forearm  Access Type: Arteriovenous fistula  Site Condition No complications  Fistula / Graft Assessment Present;Thrill;Bruit  Drainage Description None

## 2017-09-06 NOTE — Progress Notes (Signed)
Post HD Assessment    09/06/17 1400  Neurological  Level of Consciousness Alert  Orientation Level Oriented to person;Oriented to place;Oriented to situation  Respiratory  Respiratory Pattern Regular;Unlabored;Dyspnea with exertion  Cardiac  Pulse Regular  Cardiac Rhythm NSR  Ectopy Unifocal PVC's  Ectopy Frequency Frequent  Antiarrhythmic device Yes  Antiarrhythmic device  Antiarrhythmic device Permanent Pacemaker  Vascular  R Radial Pulse +2  L Radial Pulse +2  Integumentary  Integumentary (WDL) X  Musculoskeletal  Musculoskeletal (WDL) X  Generalized Weakness Yes  GU Assessment  Genitourinary (WDL) X (HD pt)  Psychosocial  Psychosocial (WDL) X  Patient Behaviors Anxious  Emotional support given Given to patient

## 2017-09-06 NOTE — Progress Notes (Signed)
HD Treatment Initiated   09/06/17 1041  Vital Signs  Pulse Rate 64  Resp 15  BP 116/60  Oxygen Therapy  SpO2 100 %  During Hemodialysis Assessment  Blood Flow Rate (mL/min) 400 mL/min  Arterial Pressure (mmHg) -160 mmHg  Venous Pressure (mmHg) 230 mmHg  Transmembrane Pressure (mmHg) 60 mmHg  Ultrafiltration Rate (mL/min) 500 mL/min  Dialysate Flow Rate (mL/min) 600 ml/min  Conductivity: Machine  13.7  HD Safety Checks Performed Yes  Dialysis Fluid Bolus Normal Saline  Bolus Amount (mL) 250 mL  Intra-Hemodialysis Comments Tx initiated  Fistula / Graft Right Forearm Arteriovenous fistula  Placement Date/Time: 04/15/16 1341   Placed prior to admission: No  Orientation: Right  Access Location: Forearm  Access Type: Arteriovenous fistula  Status Accessed  Needle Size 15

## 2017-09-06 NOTE — Progress Notes (Signed)
[facility]  ROUNDING NOTE   Subjective:     HEMODIALYSIS FLOWSHEET:  Blood Flow Rate (mL/min): 400 mL/min Arterial Pressure (mmHg): -180 mmHg Venous Pressure (mmHg): 220 mmHg Transmembrane Pressure (mmHg): 60 mmHg Ultrafiltration Rate (mL/min): 500 mL/min Dialysate Flow Rate (mL/min): 600 ml/min Conductivity: Machine : 15.4 Conductivity: Machine : 15.4 Dialysis Fluid Bolus: Normal Saline Bolus Amount (mL): 250 mL  tolerating treatment well  Objective:  Vital signs in last 24 hours:  Temp:  [97.7 F (36.5 C)-97.9 F (36.6 C)] 97.7 F (36.5 C) (08/12 1022) Pulse Rate:  [49-69] 63 (08/12 1300) Resp:  [15-22] 18 (08/12 1230) BP: (94-131)/(54-114) 116/65 (08/12 1230) SpO2:  [82 %-100 %] 100 % (08/12 1300) Weight:  [52.1 kg] 52.1 kg (08/12 1022)  Weight change:  Filed Weights   09/04/17 0456 09/05/17 0409 09/06/17 1022  Weight: 47.8 kg 49.9 kg 52.1 kg    Intake/Output: No intake/output data recorded.   Intake/Output this shift:  No intake/output data recorded.  Physical Exam: General: NAD   Head: +hard of hearing, Moist oral mucosal membranes  Eyes: Anicteric   Neck: Supple, trachea midline  Lungs:  Clear   Heart: Regular rate and rhythm  Abdomen:  Soft, nontender  Extremities:  no peripheral edema.   Neurologic: Nonfocal, moving all four extremities  Skin: +lacerations, +erythema  Access: Left AVF    Basic Metabolic Panel: Recent Labs  Lab 09/01/17 1156 09/04/17 1310  NA 138 141  K 4.1 5.2*  CL 99 100  CO2 24 27  GLUCOSE 218* 111*  BUN 60* 66*  CREATININE 4.80* 5.05*  CALCIUM 8.7* 8.8*  PHOS 6.5* 7.7*    Liver Function Tests: Recent Labs  Lab 09/01/17 1156 09/04/17 1310  ALBUMIN 3.5 3.6   No results for input(s): LIPASE, AMYLASE in the last 168 hours. No results for input(s): AMMONIA in the last 168 hours.  CBC: Recent Labs  Lab 09/01/17 1156 09/04/17 1310  WBC 6.1 6.3  HGB 11.7* 12.0  HCT 35.7 37.3  MCV 96.5 98.0   PLT 209 206    Cardiac Enzymes: No results for input(s): CKTOTAL, CKMB, CKMBINDEX, TROPONINI in the last 168 hours.  BNP: Invalid input(s): POCBNP  CBG: Recent Labs  Lab 08/31/17 0859  GLUCAP 62    Microbiology: Results for orders placed or performed during the hospital encounter of 08/30/17  Blood culture (routine x 2)     Status: None   Collection Time: 08/30/17 12:22 PM  Result Value Ref Range Status   Specimen Description BLOOD BLOOD LEFT WRIST  Final   Special Requests   Final    BOTTLES DRAWN AEROBIC AND ANAEROBIC Blood Culture adequate volume   Culture   Final    NO GROWTH 5 DAYS Performed at [facility], [address] [place], [place] [address]    Report Status 09/04/2017 FINAL  Final  Blood culture (routine x 2)     Status: None   Collection Time: 08/30/17 12:27 PM  Result Value Ref Range Status   Specimen Description BLOOD BLOOD LEFT FOREARM  Final   Special Requests   Final    BOTTLES DRAWN AEROBIC AND ANAEROBIC Blood Culture adequate volume   Culture   Final    NO GROWTH 5 DAYS Performed at [facility], [address] [place],  [address]    Report Status 09/04/2017 FINAL  Final  MRSA PCR Screening     Status: Abnormal   Collection Time: 08/30/17  9:21 PM  Result  Value Ref Range Status   MRSA by PCR POSITIVE (A) NEGATIVE Final    Comment:        The GeneXpert MRSA Assay (FDA approved for NASAL specimens only), is one component of a comprehensive MRSA colonization surveillance program. It is not intended to diagnose MRSA infection nor to guide or monitor treatment for MRSA infections. C/KAREEMA HARRIS @2330  08/30/17 [facility] Performed at [facility], [address] [place], [place] [address]     Coagulation Studies: No results for input(s): LABPROT, INR in the last 72 hours.  Urinalysis: No results for input(s): COLORURINE, LABSPEC, PHURINE, GLUCOSEU, HGBUR, BILIRUBINUR, KETONESUR, PROTEINUR, UROBILINOGEN,  NITRITE, LEUKOCYTESUR in the last 72 hours.  Invalid input(s): APPERANCEUR    Imaging: No results found.   Medications:    . cefdinir  300 mg Oral QODAY  . feeding supplement  1 Container Oral TID BM  . feeding supplement (PRO-STAT SUGAR FREE 64)  30 mL Oral BID  . heparin  5,000 Units Subcutaneous Q12H  . latanoprost  1 drop Both Eyes QHS  . levothyroxine  25 mcg Oral QAC breakfast  . metoprolol succinate  25 mg Oral Daily  . multivitamin  1 tablet Oral QHS  . multivitamin-lutein  1 capsule Oral Daily  . sevelamer carbonate  2.4 g Oral TID WC  . vitamin C  250 mg Oral BID   acetaminophen **OR** acetaminophen, hydrOXYzine, ondansetron **OR** ondansetron (ZOFRAN) IV, traMADol  Assessment/ Plan:  Sharon [name] is a 84 y.o. white female with end stage renal disease on hemodialysis, coronary artery disease, pacemaker placement, meniere's disease, congestive heart failure, peripheral vascular disease, multiple falls and fractures.   MWF [facility] permcath/left AVF EDW 46.5kg.   1. End-stage renal disease: . Patient seen during dialysis Tolerating well  2. Anemia of chronic kidney disease: hemoglobin 12 - epo as outpatient.   3. Secondary hyperparathyroidism with hyperphosphatemia: PTH not well controlled outpatient labs: 7/8: PTH 1349, calcium 8.5, phosphorus 5.8 - sevelamer with meals - change to powder due to dysphagia - current phos high at 7.7  Appreciate palliative care. Ongoing evaluation   LOS: 7 Alfonse Garringer 8/12/20192:01 PM

## 2017-09-07 LAB — BASIC METABOLIC PANEL
Anion gap: 12 (ref 5–15)
BUN: 50 mg/dL — ABNORMAL HIGH (ref 8–23)
CO2: 31 mmol/L (ref 22–32)
Calcium: 8.5 mg/dL — ABNORMAL LOW (ref 8.9–10.3)
Chloride: 97 mmol/L — ABNORMAL LOW (ref 98–111)
Creatinine, Ser: 4.04 mg/dL — ABNORMAL HIGH (ref 0.44–1.00)
GFR, EST AFRICAN AMERICAN: 10 mL/min — AB (ref >60)
GFR, EST NON AFRICAN AMERICAN: 9 mL/min — AB (ref >60)
Glucose, Bld: 111 mg/dL — ABNORMAL HIGH (ref 70–99)
POTASSIUM: 4.4 mmol/L (ref 3.5–5.1)
SODIUM: 140 mmol/L (ref 135–145)

## 2017-09-07 MED ORDER — RENA-VITE PO TABS: 1.0000 | ORAL_TABLET | Freq: Every day | ORAL | 0 refills | Status: DC

## 2017-09-07 MED ORDER — CEFDINIR 300 MG PO CAPS
300.0000 mg | ORAL_CAPSULE | ORAL | 0 refills | Status: DC
Start: 2017-09-08 — End: 2017-09-24

## 2017-09-07 MED ORDER — OCUVITE-LUTEIN PO CAPS: 1.0000 | ORAL_CAPSULE | Freq: Every day | ORAL | 0 refills | Status: DC

## 2017-09-07 MED ORDER — HYDROCORTISONE 2.5 % RE CREA
TOPICAL_CREAM | Freq: Two times a day (BID) | RECTAL | Status: DC
  Filled 2017-09-07: qty 28.35

## 2017-09-07 MED ORDER — PRO-STAT SUGAR FREE PO LIQD: 30.0000 mL | Freq: Two times a day (BID) | ORAL | 0 refills | Status: DC

## 2017-09-07 MED ORDER — TRAMADOL HCL 50 MG PO TABS: 50.0000 mg | ORAL_TABLET | Freq: Four times a day (QID) | ORAL | 0 refills | Status: DC | PRN

## 2017-09-07 MED ORDER — ONDANSETRON HCL 4 MG PO TABS: 4.0000 mg | ORAL_TABLET | Freq: Four times a day (QID) | ORAL | 0 refills | Status: DC | PRN

## 2017-09-07 MED ORDER — ASCORBIC ACID 250 MG PO TABS
250.0000 mg | ORAL_TABLET | Freq: Two times a day (BID) | ORAL | Status: DC
Start: 2017-09-07 — End: 2017-09-24

## 2017-09-07 NOTE — Clinical Social Work Note (Signed)
Patient to discharge today. Discharge information sent to [facility] and [facility] [place] is aware. Patient's brother is aware of discharge today and patient will transport via EMS. Shela Leff MSW,LcSW [phone]

## 2017-09-07 NOTE — Discharge Instructions (Signed)
Continue hemodialysis Follow-up with nephrology in a week Follow-up with vascular surgery Dr. Lucky Cowboy in 2 weeks Follow-up with primary care physician in 3 to 5 days Outpatient palliative care follow-up with the patient in 5 to 7 days Continue oxygen via nasal cannula- 3 lit  Continue wound care at the facility To continue compression stockings on the lower extremities

## 2017-09-07 NOTE — NC FL2 (Signed)
[place] LEVEL OF CARE SCREENING TOOL     IDENTIFICATION  Patient [name] Birthdate: [birth year] Sex: female Admission Date (Current Location): 08/30/2017  [facility] and [place] Number:  Engineering geologist and Address:  [facility], [address], [place], [place] [address]      Provider Number: [id number]  Attending Physician Name and Address:  Nicholes Mango, MD  Relative Name and Phone Number:       Current Level of Care: Hospital Recommended Level of Care: [place] Prior Approval Number:    Date Approved/Denied:   PASRR Number:    Discharge Plan: SNF    Current Diagnoses: Patient Active Problem List   Diagnosis Date Noted  . Left leg cellulitis 08/30/2017  . Elevated troponin 06/28/2017  . Swelling of limb 06/08/2017  . C. difficile diarrhea   . Palliative care encounter   . Encounter for hospice care discussion   . Protein-calorie malnutrition, severe 05/25/2017  . HCAP (healthcare-associated pneumonia) 05/24/2017  . Complication from renal dialysis device 04/01/2017  . Displaced fracture of lateral end of right clavicle, initial encounter for closed fracture 02/16/2017  . Failure to thrive in adult 02/03/2017  . ESRD on hemodialysis ([place])   . Pleural effusion   . Palliative care by specialist   . Pulmonary embolism ([place]) 01/02/2017  . Syncope 01/01/2017  . Vision changes 08/27/2016  . Closed fracture of neck of femur ([place]) 05/26/2016  . Hip fracture ([place]) 05/10/2016  . Falls 04/27/2016  . Head injury 04/27/2016  . Age-related osteoporosis with current pathological fracture with routine healing 04/24/2016  . Recurrent falls 04/24/2016  . Mild protein-calorie malnutrition ([place]) 04/24/2016  . ESRD on dialysis ([place]) 04/10/2016  . Periprosthetic fracture around internal prosthetic left knee joint 01/26/2016  . Pressure injury of skin 12/15/2015  . Closed left subtrochanteric femur fracture  ([place]) 12/15/2015  . Femur fracture, left ([place]) 12/14/2015  . Meniere disease   . Loss of weight 10/21/2015  . Clinical depression 06/20/2015  . Dysphagia 05/24/2015  . Imbalance 04/22/2015  . Chronic pain syndrome 05/22/2014  . Insomnia 03/13/2014  . Anxiety state 03/13/2014  . Chronic systolic CHF (congestive heart failure), NYHA class 3 ([place]) 01/15/2014  . OSA (obstructive sleep apnea) 01/15/2014  . Basal cell carcinoma of neck 11/14/2013  . DDD (degenerative disc disease), cervical 11/07/2013  . Cervical radiculitis 10/16/2013  . Benign essential hypertension 09/12/2013  . Memory loss 09/12/2013  . Systolic heart failure, chronic ([place]) 05/12/2013  . Goals of care, counseling/discussion 11/10/2012  . Sinus node dysfunction ([place]) 08/01/2012  . Low back pain 08/01/2012  . Cervical spine pain 05/02/2012  . Irritable bowel syndrome 11/02/2011  . Depression 04/01/2011  . Hypertension 04/01/2011    Orientation RESPIRATION BLADDER Height & Weight     Self  O2(5 liters) Incontinent Weight: 111 lb 12.4 oz (50.7 kg) Height:  5\' 4"  (162.6 cm)  BEHAVIORAL SYMPTOMS/MOOD NEUROLOGICAL BOWEL NUTRITION STATUS  (none) (none) Incontinent Diet(dysphagia 3)  AMBULATORY STATUS COMMUNICATION OF NEEDS Skin   Extensive Assist Verbally Normal                       Personal Care Assistance Level of Assistance  Bathing, Feeding, Dressing Bathing Assistance: Maximum assistance Feeding assistance: Maximum assistance Dressing Assistance: Maximum assistance     Functional Limitations Info  Hearing   Hearing Info: Impaired      SPECIAL CARE FACTORS FREQUENCY  Contractures Contractures Info: Not present    Additional Factors Info  Code Status Code Status Info: partial             Current Medications (09/07/2017):  This is the current hospital active medication list Current Facility-Administered Medications  Medication Dose Route Frequency Provider  Last Rate Last Dose  . acetaminophen (TYLENOL) tablet 650 mg  650 mg Oral Q6H PRN Salary, Montell D, MD   650 mg at 09/06/17 1147   Or  . acetaminophen (TYLENOL) suppository 650 mg  650 mg Rectal Q6H PRN Salary, Montell D, MD      . cefdinir (OMNICEF) capsule 300 mg  300 mg Oral Lessie Dings, Aruna, MD   300 mg at 09/06/17 1514  . feeding supplement (BOOST / RESOURCE BREEZE) liquid 1 Container  1 Container Oral TID BM Nicholes Mango, MD   1 Container at 09/06/17 2049  . feeding supplement (PRO-STAT SUGAR FREE 64) liquid 30 mL  30 mL Oral BID Gouru, Aruna, MD   30 mL at 09/06/17 2315  . heparin injection 5,000 Units  5,000 Units Subcutaneous Q12H Salary, Montell D, MD   5,000 Units at 09/06/17 2316  . hydrocortisone (ANUSOL-HC) 2.5 % rectal cream   Rectal BID Pershing Proud, NP      . hydrOXYzine (ATARAX/VISTARIL) tablet 12.5 mg  12.5 mg Oral TID PRN Nicholes Mango, MD   12.5 mg at 09/02/17 2236  . latanoprost (XALATAN) 0.005 % ophthalmic solution 1 drop  1 drop Both Eyes QHS Gouru, Aruna, MD   1 drop at 09/06/17 2316  . levothyroxine (SYNTHROID, LEVOTHROID) tablet 25 mcg  25 mcg Oral QAC breakfast Nicholes Mango, MD   25 mcg at 09/07/17 0828  . metoprolol succinate (TOPROL-XL) 24 hr tablet 25 mg  25 mg Oral Daily Nicholes Mango, MD   Stopped at 09/06/17 0941  . multivitamin (RENA-VIT) tablet 1 tablet  1 tablet Oral QHS Nicholes Mango, MD   1 tablet at 09/06/17 2316  . multivitamin-lutein (OCUVITE-LUTEIN) capsule 1 capsule  1 capsule Oral Daily Nicholes Mango, MD   Stopped at 09/06/17 1121  . ondansetron (ZOFRAN) tablet 4 mg  4 mg Oral Q6H PRN Salary, Montell D, MD       Or  . ondansetron (ZOFRAN) injection 4 mg  4 mg Intravenous Q6H PRN Salary, Montell D, MD      . sevelamer carbonate (RENVELA) powder PACK 2.4 g  2.4 g Oral TID WC Kolluru, Sarath, MD   2.4 g at 09/07/17 0829  . traMADol (ULTRAM) tablet 50 mg  50 mg Oral Q8H PRN Stegmayer, Kimberly A, PA-C      . vitamin C (ASCORBIC ACID) tablet 250 mg  250  mg Oral BID Gouru, Aruna, MD   250 mg at 09/07/17 2993   Facility-Administered Medications Ordered in Other Encounters  Medication Dose Route Frequency Provider Last Rate Last Dose  . ceFAZolin (ANCEF) IVPB 1 g/50 mL premix  1 g Intravenous Once Stegmayer, Janalyn Harder, PA-C         Discharge Medications: Please see discharge summary for a list of discharge medications.  Relevant Imaging Results:  Relevant Lab Results:   Additional Information MRSA in NARS; to have palliative care follow  Shela Leff, LCSW

## 2017-09-07 NOTE — Discharge Summary (Addendum)
Sharon [name] [name]    MR#:  [id number]  DATE OF BIRTH:  1926/10/03  DATE OF ADMISSION:  08/30/2017 ADMITTING PHYSICIAN: Gorden Harms, MD  DATE OF DISCHARGE:  09/07/17    PRIMARY CARE PHYSICIAN: Kirk Ruths, MD    ADMISSION DIAGNOSIS:  Wound infection [T14.8XXA, L08.9]  DISCHARGE DIAGNOSIS:  Active Problems:   Failure to thrive in adult   Left leg cellulitis Severe peripheral vascular disease with chronic ulcers End-stage renal disease on hemodialysis   SECONDARY DIAGNOSIS:   Past Medical History:  Diagnosis Date  . Anemia   . Anxiety   . Arthritis   . CAD (coronary artery disease)   . Cancer ([place])    skin  . Cardiomyopathy ([place])   . CHF (congestive heart failure) ([place])   . Depression   . IBS (irritable bowel syndrome) 2010  . Kidney failure July 2012   Hemodialysis 3xweek  . Kidney failure   . Meniere disease   . Meniere's disease   . Myocardial infarction ([place])   . OSA (obstructive sleep apnea)    CPAP  . Peritoneal dialysis status ([place])   . Presence of IVC filter 2019  . Presence of permanent cardiac pacemaker   . Renal insufficiency     HOSPITAL COURSE:   HISTORY OF PRESENT ILLNESS: Sharon [name]  is a 84 y.o. female with a known history of history per below which includes frequent falls, patient presents to the emergency room from extended care facility for worsening right lower extremity redness/swelling, in the emergency room patient was found to have old superficial anterior leg wound on the right leg with associated cellulitis, ER work-up otherwise unimpressive, patient does have end-stage renal disease for which patient is due for hemodialysis-typically receives on Mondays/Wednesdays/Fridays, patient evaluated in the emergency room, the patient's power of attorney/younger brother is present, noted bilateral lower extremity cyanosis-patient followed by vascular surgery on  an outpatient basis, patient denies any pain at this time, patient now be admitted for acute right lower extremity cellulitis, chronic end-stage renal disease in need of dialysis today, and bilateral lower extremities cyanosis.  *Failure to thrive Palliative care consulted, had a meeting with patient's brother healthcare power of attorney, palliative care at another follow up with the patient on September 06, 2017.  As per palliative care RN Vinie Sill ,[name] was very pleasant but very difficult to keep on task of our conversation. She  was unsuccessful in progressing [name] conversations.  Discharge patient back to [facility] with outpatient palliative care   *Acute right lower extremity cellulitis  -Doing okay -Blood cultures are negative so far.  MRSA by PCR is positive Secondary to acute right leg laceration, noted history of frequent falling events Admit to regular nursing floor bed, cellulitis protocol  IV Rocephin changed to p.o. Omnicef -antibiotics for total 14 days Encouraged to keep legs elevated Patient could not tolerate Unna boot at this time requesting to remove the [employer]  *Chronic end-stage renal disease on hemodialysisM/W/F Nephrology consulted for hemodialysis needs as patient is due for hemodialysis today  *Acute bilateral lower extremities peripheral vascular disease and chronic venous changes Consult vascular surgery seen by Dr.   Lucky Cowboy, patient has had both arterial and venous studies done within the past several months which were normal and she has a strong palpable pulses vascular is recommending to continue 2 weeks of outpatient oral antibiotics and compression wraps such as Louretta Parma  boot but patient could not tolerate Unna boot Not recommending any vascular interventions at this time Discussed with Dr. Lucky Cowboy 09/02/2017 , reevaluated by vascular PA who has offered CTA if patient is willing , but patient is refusing everything today Discussed with the patient and  patient's brother at bedside, refusing any surgery is aware that patient is not a good surgical candidate  *Chronic diabetes mellitus type 2 Sliding scale insulin with Accu-Cheks per routine  *Chronic benign essential hypertension  Stable  Continue home regiment  *Chronic systolic congestive heart failure with cardiomyopathy ejection fraction 20-25% Mild exacerbation due to end-stage renal disease in need of hemodialysis Continue metoprolol, supplemental oxygen wean as tolerated  *Chronic hypothyroidism  Stable  continuesynthroid  *Chronic secondary hyperparathyroidism continue Renvela.  *Anxiety/pruritus Vistaril as needed  Plan of care discussed with patient's brother Mr. Edward through the weekend.  He is aware of the plan DISCHARGE CONDITIONS:   Guarded   CONSULTS OBTAINED:  Treatment Team:  Lavonia Dana, MD Algernon Huxley, MD   PROCEDURES  None   DRUG ALLERGIES:   Allergies  Allergen Reactions  . Statins Other (See Comments)    Muscle weakness severe  . Codeine Sulfate Nausea And Vomiting  . Codeine Other (See Comments)    GI UPSET  . Effexor [Venlafaxine] Nausea Only  . Ezetimibe-Simvastatin Other (See Comments)    Muscle weakness    DISCHARGE MEDICATIONS:   Allergies as of 09/07/2017      Reactions   Statins Other (See Comments)   Muscle weakness severe   Codeine Sulfate Nausea And Vomiting   Codeine Other (See Comments)   GI UPSET   Effexor [venlafaxine] Nausea Only   Ezetimibe-simvastatin Other (See Comments)   Muscle weakness      Medication List    STOP taking these medications   benzonatate 200 MG capsule Commonly known as:  TESSALON   LORazepam 0.5 MG tablet Commonly known as:  ATIVAN   metoCLOPramide 5 MG tablet Commonly known as:  REGLAN   Multiple Vitamin tablet     TAKE these medications   acetaminophen 325 MG tablet Commonly known as:  TYLENOL Take 2 tablets (650 mg total) by mouth every 4 (four) hours as  needed for mild pain or moderate pain.   ascorbic acid 250 MG tablet Commonly known as:  VITAMIN C Take 1 tablet (250 mg total) by mouth 2 (two) times daily. What changed:    medication strength  how much to take  when to take this   cefdinir 300 MG capsule Commonly known as:  OMNICEF Take 1 capsule (300 mg total) by mouth every other day for 14 days. Start taking on:  09/08/2017   feeding supplement (PRO-STAT SUGAR FREE 64) Liqd Take 30 mLs by mouth 2 (two) times daily.   guaiFENesin-dextromethorphan 100-10 MG/5ML syrup Commonly known as:  ROBITUSSIN DM Take 5 mLs by mouth every 4 (four) hours as needed for cough.   hydrocortisone 2.5 % cream Apply 1 application topically 3 (three) times daily.   hydrOXYzine 25 MG tablet Commonly known as:  ATARAX/VISTARIL Take 12.5 mg by mouth 3 (three) times daily.   latanoprost 0.005 % ophthalmic solution Commonly known as:  XALATAN Place 1 drop into both eyes at bedtime.   levothyroxine 25 MCG tablet Commonly known as:  SYNTHROID, LEVOTHROID Take 1 tablet (25 mcg total) by mouth daily.   lidocaine 4 % cream Commonly known as:  LMX Apply 1 application topically every Monday, Wednesday, and Friday with  hemodialysis.   metoprolol succinate 25 MG 24 hr tablet Commonly known as:  TOPROL-XL Take 1 tablet (25 mg total) by mouth daily.   multivitamin Tabs tablet Take 1 tablet by mouth at bedtime.   multivitamin-lutein Caps capsule Take 1 capsule by mouth daily.   ondansetron 4 MG tablet Commonly known as:  ZOFRAN Take 1 tablet (4 mg total) by mouth every 6 (six) hours as needed for nausea.   sevelamer carbonate 800 MG tablet Commonly known as:  RENVELA Take 1 tablet (800 mg total) by mouth 3 (three) times daily with meals.   traMADol 50 MG tablet Commonly known as:  ULTRAM Take 1-2 tablets (50-100 mg total) by mouth every 6 (six) hours as needed for moderate pain.   Vitamin D (Ergocalciferol) 50000 units Caps  capsule Commonly known as:  DRISDOL Take 50,000 Units by mouth every Monday.        DISCHARGE INSTRUCTIONS:   Continue hemodialysis Follow-up with nephrology in a week Follow-up with vascular surgery Dr. Dew in 2 weeks Follow-up with primary care physician in 3 to 5 days Outpatient palliative care follow-up with the patient in 5 to 7 days Continue oxygen via nasal cannula- 3 lit  Continue wound care at the facility To continue compression stockings on the lower extremities  DIET:  Renal diet  DISCHARGE CONDITION:  Fair  ACTIVITY:  Activity as tolerated  OXYGEN:  Home Oxygen: Yes.     Oxygen Delivery: 3 liters/min via Patient connected to nasal cannula oxygen  DISCHARGE LOCATION:  [facility]  If you experience worsening of your admission symptoms, develop shortness of breath, life threatening emergency, suicidal or homicidal thoughts you must seek medical attention immediately by calling 911 or calling your MD immediately  if symptoms less severe.  You Must read complete instructions/literature along with all the possible adverse reactions/side effects for all the Medicines you take and that have been prescribed to you. Take any new Medicines after you have completely understood and accpet all the possible adverse reactions/side effects.   Please note  You were cared for by a hospitalist during your hospital stay. If you have any questions about your discharge medications or the care you received while you were in the hospital after you are discharged, you can call the unit and asked to speak with the hospitalist on call if the hospitalist that took care of you is not available. Once you are discharged, your primary care physician will handle any further medical issues. Please note that NO REFILLS for any discharge medications will be authorized once you are discharged, as it is imperative that you return to your primary care physician (or establish a  relationship with a primary care physician if you do not have one) for your aftercare needs so that they can reassess your need for medications and monitor your lab values.     Today  Chief Complaint  Patient presents with  . Leg Pain   Patient is doing okay.  Palliative care met with the patient yesterday but goals of care were not established as the patient was all over the place.  Hard to keep her on the task of the goal of conversation  ROS:  CONSTITUTIONAL: Denies fevers, chills. Denies any fatigue, weakness.  EYES: Denies blurry vision, double vision, eye pain. EARS, NOSE, THROAT: Denies tinnitus, ear pain, hearing loss. RESPIRATORY: Denies cough, wheeze, shortness of breath.  CARDIOVASCULAR: Denies chest pain, palpitations, edema.  GASTROINTESTINAL: Denies nausea, vomiting, diarrhea, abdominal pain.   Denies bright red blood per rectum. GENITOURINARY: Denies dysuria, hematuria. ENDOCRINE: Denies nocturia or thyroid problems. HEMATOLOGIC AND LYMPHATIC: Denies easy bruising or bleeding. SKIN: Chronic lower extremity peripheral vascular disease with chronic nonhealing ulcers  mUSCULOSKELETAL: Has chronic pain  nEUROLOGIC: Denies paralysis, paresthesias.  PSYCHIATRIC: Denies anxiety or depressive symptoms.   VITAL SIGNS:  Blood pressure 129/64, pulse 67, temperature 97.7 F (36.5 C), temperature source Oral, resp. rate 18, height 5' 4" (1.626 m), weight 50.7 kg, SpO2 100 %.  I/O:    Intake/Output Summary (Last 24 hours) at 09/07/2017 1226 Last data filed at 09/06/2017 2107 Gross per 24 hour  Intake -  Output 1026 ml  Net -1026 ml    PHYSICAL EXAMINATION:  GENERAL:  84 y.o.-year-old patient lying in the bed with no acute distress.  EYES: Pupils equal, round, reactive to light and accommodation. No scleral icterus. Extraocular muscles intact.  HEENT: Head atraumatic, normocephalic. Oropharynx and nasopharynx clear.  NECK:  Supple, no jugular venous distention. No thyroid  enlargement, no tenderness.  LUNGS: Normal breath sounds bilaterally, no wheezing, rales,rhonchi or crepitation. No use of accessory muscles of respiration.  CARDIOVASCULAR: S1, S2 normal. No murmurs, rubs, or gallops.  ABDOMEN: Soft, non-tender, non-distended. Bowel sounds present. No organomegaly or mass.  EXTREMITIES: No clubbing or cyanotic peripheries NEUROLOGIC:Sensation intact. Gait not checked.  PSYCHIATRIC: The patient is alert and oriented x 3.  SKIN: Right leg healing laceration, left leg dusky purplish erythema is getting worse with acute erythema of the foot, bilateral lower extremity dusky, cold to touch peripheral pulses 1+  DATA REVIEW:   CBC Recent Labs  Lab 09/04/17 1310  WBC 6.3  HGB 12.0  HCT 37.3  PLT 206    Chemistries  Recent Labs  Lab 09/07/17 1032  NA 140  K 4.4  CL 97*  CO2 31  GLUCOSE 111*  BUN 50*  CREATININE 4.04*  CALCIUM 8.5*    Cardiac Enzymes No results for input(s): TROPONINI in the last 168 hours.  Microbiology Results  Results for orders placed or performed during the hospital encounter of 08/30/17  Blood culture (routine x 2)     Status: None   Collection Time: 08/30/17 12:22 PM  Result Value Ref Range Status   Specimen Description BLOOD BLOOD LEFT WRIST  Final   Special Requests   Final    BOTTLES DRAWN AEROBIC AND ANAEROBIC Blood Culture adequate volume   Culture   Final    NO GROWTH 5 DAYS Performed at [facility], [address] [place], [place] [address]    Report Status 09/04/2017 FINAL  Final  Blood culture (routine x 2)     Status: None   Collection Time: 08/30/17 12:27 PM  Result Value Ref Range Status   Specimen Description BLOOD BLOOD LEFT FOREARM  Final   Special Requests   Final    BOTTLES DRAWN AEROBIC AND ANAEROBIC Blood Culture adequate volume   Culture   Final    NO GROWTH 5 DAYS Performed at [facility], [address] , [place] [address]    Report Status 09/04/2017 FINAL   Final  MRSA PCR Screening     Status: Abnormal   Collection Time: 08/30/17  9:21 PM  Result Value Ref Range Status   MRSA by PCR POSITIVE (A) NEGATIVE Final    Comment:        The GeneXpert MRSA Assay (FDA approved for NASAL specimens only), is one component of a comprehensive MRSA colonization surveillance program. It   is not intended to diagnose MRSA infection nor to guide or monitor treatment for MRSA infections. C/KAREEMA HARRIS @2330 08/30/17 FLC Performed at [facility], [address] [place], [place] [address]     RADIOLOGY:  No results found.  EKG:   Orders placed or performed during the hospital encounter of 06/28/17  . ED EKG  . ED EKG  . EKG      Management plans discussed with the patient, family and they are in agreement.  CODE STATUS:     Code Status Orders  (From admission, onward)         Start     Ordered   08/30/17 1502  Limited resuscitation (code)  Continuous    Question Answer Comment  In the event of cardiac or respiratory ARREST: Initiate Code Blue, Call Rapid Response Yes   In the event of cardiac or respiratory ARREST: Perform CPR No   In the event of cardiac or respiratory ARREST: Perform Intubation/Mechanical Ventilation No   In the event of cardiac or respiratory ARREST: Use NIPPV/BiPAp only if indicated Yes   In the event of cardiac or respiratory ARREST: Administer ACLS medications if indicated Yes   In the event of cardiac or respiratory ARREST: Perform Defibrillation or Cardioversion if indicated Yes      08/30/17 1501        Code Status History    Date Active Date Inactive Code Status Order ID Comments User Context   06/29/2017 0905 07/01/2017 2019 DNR [id number]  Patel, Shreyang, MD Inpatient   06/28/2017 1239 06/29/2017 0904 DNR [id number]  Sainani, Vivek J, MD Inpatient   05/24/2017 2258 06/01/2017 1706 DNR [id number]  Vachhani, Vaibhavkumar, MD Inpatient   02/16/2017 1928 02/17/2017 1949 Full Code [id number]  Poggi, John J,  MD Inpatient   02/03/2017 1230 02/04/2017 1907 DNR [id number]  , , MD ED   01/01/2017 2027 01/08/2017 1858 Full Code [id number]  Sudini, Srikar, MD ED   05/12/2016 0809 05/14/2016 1439 DNR [id number]  Shah, Vipul, MD Inpatient   05/10/2016 0506 05/12/2016 0809 Full Code [id number]  Pyreddy, Pavan, MD Inpatient   12/15/2015 1515 12/19/2015 0159 Full Code [id number]  Abtahi, Keivan, DO Inpatient   12/14/2015 1613 12/15/2015 1515 Full Code [id number]  Mody, Sital, MD Inpatient    Advance Directive Documentation     Most Recent Value  Type of Advance Directive  Healthcare Power of Attorney  Pre-existing out of facility DNR order (yellow form or pink MOST form)  -  "MOST" Form in Place?  -      TOTAL TIME TAKING CARE OF THIS PATIENT: 45 minutes.   Note: This dictation was prepared with Dragon dictation along with smaller phrase technology. Any transcriptional errors that result from this process are unintentional.   @MEC@  on 09/07/2017 at 12:26 PM  Between 7am to 6pm - Pager - [phone]  After 6pm go to [email or web address] - password [email or web address]  Office  [phone]  CC: Primary care physician; Anderson, Marshall W, MD    

## 2017-09-07 NOTE — Care Management (Signed)
Amanda Morris dialysis liaison notified of discharge  

## 2017-09-07 NOTE — Progress Notes (Signed)
Patient cleared for discharge by [facility]      Education complete. AVS printed. Discharge instructions given. All questions answered for patient clarification.  Prescriptions given, pharmacy verified.  IV removed.  Discharged to [facility] by EMS.

## 2017-09-07 NOTE — Progress Notes (Signed)
Nutrition Follow Up Note   DOCUMENTATION CODES:   Severe malnutrition in context of chronic illness  INTERVENTION:   Prostat liquid protein PO 30 ml TID with meals, each supplement provides 100 kcal, 15 grams protein.  Boost Breeze po TID, each supplement provides 250 kcal and 9 grams of protein  Rena-vite daily  Ocuvite daily for wound healing (provides zinc, vitamin A, vitamin C, Vitamin E, copper, and selenium)  Vitamin C '250mg'$  po BID  Dysphagia 3 diet   NUTRITION DIAGNOSIS:   Severe Malnutrition related to chronic illness(ESRD on HD) as evidenced by severe fat depletion, severe muscle depletion.  GOAL:   Patient will meet greater than or equal to 90% of their needs  -not met   MONITOR:   PO intake, Supplement acceptance, Labs, Skin, Weight trends, I & O's   ASSESSMENT:   84 y.o. white female with end stage renal disease on hemodialysis, coronary artery disease, pacemaker placement, meniere's disease, congestive heart failure, peripheral vascular disease, multiple falls and fractures.    Pt continues to have poor appetite and oral intake and is refusing most of her supplements. Pt with intermittent confusion. Palliative care following for GOC. Phosphorus and potassium elevated today. Last HD 8/12. Per chart, pt is weight stable. RD will continue to monitor for [place].  Medications reviewed and include: heparin, cefdinir, synthroid, rena-vite, ocuvite, renvela, vitamin C   Labs reviewed: K 5.2(H), BUN 66(H), creat 5.05(H), P 7.7(H)  Diet Order:   Diet Order            DIET DYS 3 Room service appropriate? Yes; Fluid consistency: Thin  Diet effective now             EDUCATION NEEDS:   Not appropriate for education at this time  Skin:  Skin Assessment: Reviewed RN Assessment(RLE wound, ecchymosis )  Last BM:  unknown   Height:   Ht Readings from Last 1 Encounters:  08/30/17 '5\' 4"'$  (1.626 m)    Weight:   Wt Readings from Last 1 Encounters:  09/07/17 50.7  kg    Ideal Body Weight:  54.5 kg  BMI:  Body mass index is 19.19 kg/m.  Estimated Nutritional Needs:   Kcal:  1200-1500kcal/day   Protein:  78-88g/day   Fluid:  1.2L/day   Koleen Distance MS, RD, LDN Pager #- [phone] Office#- [phone] After Hours Pager: [phone]

## 2017-09-07 NOTE — Progress Notes (Signed)
New referral for outpatient Palliative to follow at [facility] received from [place]. Please note, a referral was received at last admission in June 2019, at that time per [employer], patient was already Cabin crew followed by [facility]. Patient information faxed to referral. Thank you. Flo Shanks RN, BSN, [facility] [place], hospital Liaison [phone]

## 2017-09-07 NOTE — Progress Notes (Signed)
[facility]  ROUNDING NOTE   Subjective:   Patient is doing better today.  Able to sit in the chair No acute complaints No shortness of breath   Objective:  Vital signs in last 24 hours:  Temp:  [97.7 F (36.5 C)-98 F (36.7 C)] 97.7 F (36.5 C) (08/13 1105) Pulse Rate:  [47-70] 67 (08/13 1105) Resp:  [16-22] 18 (08/13 1105) BP: (87-129)/(61-66) 129/64 (08/13 1105) SpO2:  [60 %-100 %] 100 % (08/13 1105) Weight:  [50.7 kg] 50.7 kg (08/13 0528)  Weight change:  Filed Weights   09/06/17 1022 09/06/17 1400 09/07/17 0528  Weight: 52.1 kg 51.1 kg 50.7 kg    Intake/Output: I/O last 3 completed shifts: In: -  Out: 1026 [Other:1026]   Intake/Output this shift:  No intake/output data recorded.  Physical Exam: General: NAD   Head: +hard of hearing, Moist oral mucosal membranes  Eyes: Anicteric   Neck: Supple, trachea midline  Lungs:  Clear   Heart: Regular rate and rhythm  Abdomen:  Soft, nontender  Extremities:  no peripheral edema.   Neurologic: Nonfocal, moving all four extremities  Skin: +lacerations, +erythema  Access: Left AVF    Basic Metabolic Panel: Recent Labs  Lab 09/01/17 1156 09/04/17 1310 09/07/17 1032  NA 138 141 140  K 4.1 5.2* 4.4  CL 99 100 97*  CO2 '24 27 31  '$ GLUCOSE 218* 111* 111*  BUN 60* 66* 50*  CREATININE 4.80* 5.05* 4.04*  CALCIUM 8.7* 8.8* 8.5*  PHOS 6.5* 7.7*  --     Liver Function Tests: Recent Labs  Lab 09/01/17 1156 09/04/17 1310  ALBUMIN 3.5 3.6   No results for input(s): LIPASE, AMYLASE in the last 168 hours. No results for input(s): AMMONIA in the last 168 hours.  CBC: Recent Labs  Lab 09/01/17 1156 09/04/17 1310  WBC 6.1 6.3  HGB 11.7* 12.0  HCT 35.7 37.3  MCV 96.5 98.0  PLT 209 206    Cardiac Enzymes: No results for input(s): CKTOTAL, CKMB, CKMBINDEX, TROPONINI in the last 168 hours.  BNP: Invalid input(s): POCBNP  CBG: No results for input(s): GLUCAP in the last 168  hours.  Microbiology: Results for orders placed or performed during the hospital encounter of 08/30/17  Blood culture (routine x 2)     Status: None   Collection Time: 08/30/17 12:22 PM  Result Value Ref Range Status   Specimen Description BLOOD BLOOD LEFT WRIST  Final   Special Requests   Final    BOTTLES DRAWN AEROBIC AND ANAEROBIC Blood Culture adequate volume   Culture   Final    NO GROWTH 5 DAYS Performed at [facility], [address], [place], [place] [address]    Report Status 09/04/2017 FINAL  Final  Blood culture (routine x 2)     Status: None   Collection Time: 08/30/17 12:27 PM  Result Value Ref Range Status   Specimen Description BLOOD BLOOD LEFT FOREARM  Final   Special Requests   Final    BOTTLES DRAWN AEROBIC AND ANAEROBIC Blood Culture adequate volume   Culture   Final    NO GROWTH 5 DAYS Performed at [facility], [address] [place], [place] [address]    Report Status 09/04/2017 FINAL  Final  MRSA PCR Screening     Status: Abnormal   Collection Time: 08/30/17  9:21 PM  Result Value Ref Range Status   MRSA by PCR POSITIVE (A) NEGATIVE Final    Comment:  The GeneXpert MRSA Assay (FDA approved for NASAL specimens only), is one component of a comprehensive MRSA colonization surveillance program. It is not intended to diagnose MRSA infection nor to guide or monitor treatment for MRSA infections. C/KAREEMA HARRIS '@2330'$  08/30/17 FLC Performed at [facility], [address] [place], [place] [address]     Coagulation Studies: No results for input(s): LABPROT, INR in the last 72 hours.  Urinalysis: No results for input(s): COLORURINE, LABSPEC, PHURINE, GLUCOSEU, HGBUR, BILIRUBINUR, KETONESUR, PROTEINUR, UROBILINOGEN, NITRITE, LEUKOCYTESUR in the last 72 hours.  Invalid input(s): APPERANCEUR    Imaging: No results found.   Medications:    . cefdinir  300 mg Oral QODAY  . feeding supplement  1 Container Oral  TID BM  . feeding supplement (PRO-STAT SUGAR FREE 64)  30 mL Oral BID  . heparin  5,000 Units Subcutaneous Q12H  . hydrocortisone   Rectal BID  . latanoprost  1 drop Both Eyes QHS  . levothyroxine  25 mcg Oral QAC breakfast  . metoprolol succinate  25 mg Oral Daily  . multivitamin  1 tablet Oral QHS  . multivitamin-lutein  1 capsule Oral Daily  . sevelamer carbonate  2.4 g Oral TID WC  . vitamin C  250 mg Oral BID   acetaminophen **OR** acetaminophen, hydrOXYzine, ondansetron **OR** ondansetron (ZOFRAN) IV, traMADol  Assessment/ Plan:  [name] is a 84 y.o. white female with end stage renal disease on hemodialysis, coronary artery disease, pacemaker placement, meniere's disease, congestive heart failure, peripheral vascular disease, multiple falls and fractures.   MWF [facility] permcath/left AVF EDW 46.5kg.   1. End-stage renal disease:   Patient is expected to be discharged today.  She will continue her hemodialysis as outpatient  2. Anemia of chronic kidney disease: hemoglobin 12 - epo as outpatient.   3. Secondary hyperparathyroidism with hyperphosphatemia: PTH not well controlled outpatient labs: 7/8: PTH 1349, calcium 8.5, phosphorus 5.8 - sevelamer with meals - change to powder due to dysphagia - current phos high at 7.7  Met with patient's brother   LOS: [place] 8/13/20192:38 PM

## 2017-09-07 NOTE — Progress Notes (Signed)
Palliative:  I met today with [name] along with her friend, Sharon [name], at bedside. No issues or complaints about dialysis today. [name] main complaint are "hemorrhoids" and I was unable to assess as she was sitting in recliner but I did order some hemorrhoid cream at her request. [name] was very pleasant but very difficult to keep on task of our conversation. She wished to speak of writing to her congressman about the poor conditions of people in SNF. I was unsuccessful in progressing [name] conversations.   I did speak with Dr. Candiss Norse whom [name] is well known. He shares that she has been struggling over the past year with declining functional status and QOL but has not yet come to terms with EOL or the desire to stop aggressive care or dialysis (other than fleeting moments but not consistent).   Will benefit from outpatient palliative to follow and continue conversations.   Exam: Alert, mostly oriented, pleasant, good spirits. No distress. Frail, elderly.   25 min   Vinie Sill, NP Palliative Medicine Team Pager # [phone] (M-F 8a-5p) Team Phone # [phone] (Nights/Weekends)

## 2017-09-16 ENCOUNTER — Emergency Department: Payer: Medicare Other

## 2017-09-16 ENCOUNTER — Observation Stay: Payer: Medicare Other

## 2017-09-16 ENCOUNTER — Inpatient Hospital Stay
Admission: EM | Admit: 2017-09-16 | Discharge: 2017-09-24 | DRG: 291 | Payer: Medicare Other | Attending: Internal Medicine | Admitting: Internal Medicine

## 2017-09-16 ENCOUNTER — Encounter: Payer: Self-pay | Admitting: Emergency Medicine

## 2017-09-16 ENCOUNTER — Other Ambulatory Visit: Payer: Self-pay

## 2017-09-16 DIAGNOSIS — Z9842 Cataract extraction status, left eye: Secondary | ICD-10-CM

## 2017-09-16 DIAGNOSIS — Z950 Presence of cardiac pacemaker: Secondary | ICD-10-CM

## 2017-09-16 DIAGNOSIS — N186 End stage renal disease: Secondary | ICD-10-CM | POA: Diagnosis present

## 2017-09-16 DIAGNOSIS — Z86711 Personal history of pulmonary embolism: Secondary | ICD-10-CM

## 2017-09-16 DIAGNOSIS — I132 Hypertensive heart and chronic kidney disease with heart failure and with stage 5 chronic kidney disease, or end stage renal disease: Secondary | ICD-10-CM | POA: Diagnosis not present

## 2017-09-16 DIAGNOSIS — Z87891 Personal history of nicotine dependence: Secondary | ICD-10-CM

## 2017-09-16 DIAGNOSIS — Z9049 Acquired absence of other specified parts of digestive tract: Secondary | ICD-10-CM

## 2017-09-16 DIAGNOSIS — G4733 Obstructive sleep apnea (adult) (pediatric): Secondary | ICD-10-CM | POA: Diagnosis present

## 2017-09-16 DIAGNOSIS — Z808 Family history of malignant neoplasm of other organs or systems: Secondary | ICD-10-CM

## 2017-09-16 DIAGNOSIS — Z6820 Body mass index (BMI) 20.0-20.9, adult: Secondary | ICD-10-CM

## 2017-09-16 DIAGNOSIS — Z515 Encounter for palliative care: Secondary | ICD-10-CM | POA: Diagnosis present

## 2017-09-16 DIAGNOSIS — R0603 Acute respiratory distress: Secondary | ICD-10-CM

## 2017-09-16 DIAGNOSIS — Z95828 Presence of other vascular implants and grafts: Secondary | ICD-10-CM

## 2017-09-16 DIAGNOSIS — Z9889 Other specified postprocedural states: Secondary | ICD-10-CM

## 2017-09-16 DIAGNOSIS — N2581 Secondary hyperparathyroidism of renal origin: Secondary | ICD-10-CM | POA: Diagnosis present

## 2017-09-16 DIAGNOSIS — I2510 Atherosclerotic heart disease of native coronary artery without angina pectoris: Secondary | ICD-10-CM | POA: Diagnosis present

## 2017-09-16 DIAGNOSIS — J9811 Atelectasis: Secondary | ICD-10-CM | POA: Diagnosis present

## 2017-09-16 DIAGNOSIS — I248 Other forms of acute ischemic heart disease: Secondary | ICD-10-CM | POA: Diagnosis present

## 2017-09-16 DIAGNOSIS — Z86718 Personal history of other venous thrombosis and embolism: Secondary | ICD-10-CM

## 2017-09-16 DIAGNOSIS — S12600A Unspecified displaced fracture of seventh cervical vertebra, initial encounter for closed fracture: Secondary | ICD-10-CM | POA: Diagnosis present

## 2017-09-16 DIAGNOSIS — W19XXXA Unspecified fall, initial encounter: Secondary | ICD-10-CM | POA: Diagnosis present

## 2017-09-16 DIAGNOSIS — R627 Adult failure to thrive: Secondary | ICD-10-CM | POA: Diagnosis present

## 2017-09-16 DIAGNOSIS — S7001XA Contusion of right hip, initial encounter: Secondary | ICD-10-CM | POA: Diagnosis present

## 2017-09-16 DIAGNOSIS — Z833 Family history of diabetes mellitus: Secondary | ICD-10-CM

## 2017-09-16 DIAGNOSIS — Z825 Family history of asthma and other chronic lower respiratory diseases: Secondary | ICD-10-CM

## 2017-09-16 DIAGNOSIS — Z9981 Dependence on supplemental oxygen: Secondary | ICD-10-CM

## 2017-09-16 DIAGNOSIS — Z992 Dependence on renal dialysis: Secondary | ICD-10-CM

## 2017-09-16 DIAGNOSIS — J918 Pleural effusion in other conditions classified elsewhere: Secondary | ICD-10-CM | POA: Diagnosis present

## 2017-09-16 DIAGNOSIS — R296 Repeated falls: Secondary | ICD-10-CM | POA: Diagnosis present

## 2017-09-16 DIAGNOSIS — H8109 Meniere's disease, unspecified ear: Secondary | ICD-10-CM | POA: Diagnosis present

## 2017-09-16 DIAGNOSIS — Z66 Do not resuscitate: Secondary | ICD-10-CM | POA: Diagnosis present

## 2017-09-16 DIAGNOSIS — Z9841 Cataract extraction status, right eye: Secondary | ICD-10-CM

## 2017-09-16 DIAGNOSIS — K589 Irritable bowel syndrome without diarrhea: Secondary | ICD-10-CM | POA: Diagnosis present

## 2017-09-16 DIAGNOSIS — Y92129 Unspecified place in nursing home as the place of occurrence of the external cause: Secondary | ICD-10-CM

## 2017-09-16 DIAGNOSIS — J9611 Chronic respiratory failure with hypoxia: Secondary | ICD-10-CM

## 2017-09-16 DIAGNOSIS — S0990XA Unspecified injury of head, initial encounter: Secondary | ICD-10-CM

## 2017-09-16 DIAGNOSIS — Z888 Allergy status to other drugs, medicaments and biological substances status: Secondary | ICD-10-CM

## 2017-09-16 DIAGNOSIS — J90 Pleural effusion, not elsewhere classified: Secondary | ICD-10-CM | POA: Diagnosis not present

## 2017-09-16 DIAGNOSIS — Z885 Allergy status to narcotic agent status: Secondary | ICD-10-CM

## 2017-09-16 DIAGNOSIS — I739 Peripheral vascular disease, unspecified: Secondary | ICD-10-CM | POA: Diagnosis present

## 2017-09-16 DIAGNOSIS — Z96652 Presence of left artificial knee joint: Secondary | ICD-10-CM | POA: Diagnosis present

## 2017-09-16 DIAGNOSIS — D631 Anemia in chronic kidney disease: Secondary | ICD-10-CM | POA: Diagnosis present

## 2017-09-16 DIAGNOSIS — G9341 Metabolic encephalopathy: Secondary | ICD-10-CM | POA: Diagnosis present

## 2017-09-16 DIAGNOSIS — F0390 Unspecified dementia without behavioral disturbance: Secondary | ICD-10-CM | POA: Diagnosis present

## 2017-09-16 DIAGNOSIS — I4430 Unspecified atrioventricular block: Secondary | ICD-10-CM | POA: Diagnosis present

## 2017-09-16 DIAGNOSIS — R4182 Altered mental status, unspecified: Secondary | ICD-10-CM

## 2017-09-16 DIAGNOSIS — I429 Cardiomyopathy, unspecified: Secondary | ICD-10-CM | POA: Diagnosis present

## 2017-09-16 DIAGNOSIS — Z818 Family history of other mental and behavioral disorders: Secondary | ICD-10-CM

## 2017-09-16 DIAGNOSIS — S61511A Laceration without foreign body of right wrist, initial encounter: Secondary | ICD-10-CM | POA: Diagnosis present

## 2017-09-16 DIAGNOSIS — J9621 Acute and chronic respiratory failure with hypoxia: Secondary | ICD-10-CM | POA: Diagnosis present

## 2017-09-16 DIAGNOSIS — H9190 Unspecified hearing loss, unspecified ear: Secondary | ICD-10-CM | POA: Diagnosis present

## 2017-09-16 DIAGNOSIS — I252 Old myocardial infarction: Secondary | ICD-10-CM

## 2017-09-16 DIAGNOSIS — S129XXA Fracture of neck, unspecified, initial encounter: Secondary | ICD-10-CM

## 2017-09-16 DIAGNOSIS — Z90710 Acquired absence of both cervix and uterus: Secondary | ICD-10-CM

## 2017-09-16 DIAGNOSIS — Z79890 Hormone replacement therapy (postmenopausal): Secondary | ICD-10-CM

## 2017-09-16 DIAGNOSIS — S0101XA Laceration without foreign body of scalp, initial encounter: Secondary | ICD-10-CM | POA: Diagnosis present

## 2017-09-16 DIAGNOSIS — E43 Unspecified severe protein-calorie malnutrition: Secondary | ICD-10-CM | POA: Diagnosis present

## 2017-09-16 DIAGNOSIS — I5022 Chronic systolic (congestive) heart failure: Secondary | ICD-10-CM | POA: Diagnosis present

## 2017-09-16 DIAGNOSIS — J939 Pneumothorax, unspecified: Secondary | ICD-10-CM

## 2017-09-16 LAB — COMPREHENSIVE METABOLIC PANEL
ALBUMIN: 3.5 g/dL (ref 3.5–5.0)
ALT: 16 U/L (ref 0–44)
AST: 23 U/L (ref 15–41)
Alkaline Phosphatase: 86 U/L (ref 38–126)
Anion gap: 12 (ref 5–15)
BUN: 43 mg/dL — AB (ref 8–23)
CHLORIDE: 99 mmol/L (ref 98–111)
CO2: 27 mmol/L (ref 22–32)
Calcium: 8.4 mg/dL — ABNORMAL LOW (ref 8.9–10.3)
Creatinine, Ser: 3.57 mg/dL — ABNORMAL HIGH (ref 0.44–1.00)
GFR calc Af Amer: 12 mL/min — ABNORMAL LOW (ref >60)
GFR, EST NON AFRICAN AMERICAN: 10 mL/min — AB (ref >60)
GLUCOSE: 88 mg/dL (ref 70–99)
POTASSIUM: 3.5 mmol/L (ref 3.5–5.1)
SODIUM: 138 mmol/L (ref 135–145)
Total Bilirubin: 1.1 mg/dL (ref 0.3–1.2)
Total Protein: 7.2 g/dL (ref 6.5–8.1)

## 2017-09-16 LAB — TSH: TSH: 5.016 u[IU]/mL — ABNORMAL HIGH (ref 0.350–4.500)

## 2017-09-16 LAB — LACTATE DEHYDROGENASE, PLEURAL OR PERITONEAL FLUID: LD, Fluid: 72 U/L — ABNORMAL HIGH (ref 3–23)

## 2017-09-16 LAB — TROPONIN I
TROPONIN I: 0.11 ng/mL — AB (ref <0.03)
Troponin I: 0.11 ng/mL (ref <0.03)
Troponin I: 0.13 ng/mL (ref <0.03)
Troponin I: 0.46 ng/mL (ref <0.03)

## 2017-09-16 LAB — BODY FLUID CELL COUNT WITH DIFFERENTIAL
EOS FL: 3 %
Lymphs, Fluid: 32 %
Monocyte-Macrophage-Serous Fluid: 43 %
Neutrophil Count, Fluid: 19 %
Other Cells, Fluid: 3 %
WBC FLUID: 43 uL

## 2017-09-16 LAB — AMYLASE, PLEURAL OR PERITONEAL FLUID: AMYLASE FL: 61 U/L

## 2017-09-16 LAB — CBC
HCT: 35.6 % (ref 35.0–47.0)
HEMOGLOBIN: 11.7 g/dL — AB (ref 12.0–16.0)
MCH: 31.9 pg (ref 26.0–34.0)
MCHC: 33 g/dL (ref 32.0–36.0)
MCV: 96.8 fL (ref 80.0–100.0)
Platelets: 197 10*3/uL (ref 150–440)
RBC: 3.68 MIL/uL — AB (ref 3.80–5.20)
RDW: 15.9 % — ABNORMAL HIGH (ref 11.5–14.5)
WBC: 5.3 10*3/uL (ref 3.6–11.0)

## 2017-09-16 LAB — PROTEIN, PLEURAL OR PERITONEAL FLUID: Total protein, fluid: 3.1 g/dL

## 2017-09-16 LAB — GLUCOSE, PLEURAL OR PERITONEAL FLUID: GLUCOSE FL: 86 mg/dL

## 2017-09-16 MED ORDER — RENA-VITE PO TABS
1.0000 | ORAL_TABLET | Freq: Every day | ORAL | Status: DC
  Administered 2017-09-17 – 2017-09-19 (×3): 1 via ORAL
  Filled 2017-09-16 (×7): qty 1

## 2017-09-16 MED ORDER — GUAIFENESIN-DM 100-10 MG/5ML PO SYRP
5.0000 mL | ORAL_SOLUTION | ORAL | Status: DC | PRN
  Filled 2017-09-16: qty 5

## 2017-09-16 MED ORDER — TRAMADOL HCL 50 MG PO TABS
50.0000 mg | ORAL_TABLET | Freq: Four times a day (QID) | ORAL | Status: DC | PRN
  Administered 2017-09-18 – 2017-09-19 (×2): 100 mg via ORAL
  Filled 2017-09-16 (×2): qty 2

## 2017-09-16 MED ORDER — ONDANSETRON HCL 4 MG/2ML IJ SOLN
4.0000 mg | Freq: Four times a day (QID) | INTRAMUSCULAR | Status: DC | PRN
  Administered 2017-09-20: 01:00:00 4 mg via INTRAVENOUS
  Filled 2017-09-16: qty 2

## 2017-09-16 MED ORDER — LATANOPROST 0.005 % OP SOLN
1.0000 [drp] | Freq: Every day | OPHTHALMIC | Status: DC
  Administered 2017-09-16 – 2017-09-19 (×3): 1 [drp] via OPHTHALMIC
  Filled 2017-09-16: qty 2.5

## 2017-09-16 MED ORDER — VITAMIN D (ERGOCALCIFEROL) 1.25 MG (50000 UNIT) PO CAPS
50000.0000 [IU] | ORAL_CAPSULE | ORAL | Status: DC
  Filled 2017-09-16: qty 1

## 2017-09-16 MED ORDER — ONDANSETRON HCL 4 MG PO TABS: 4.0000 mg | ORAL_TABLET | Freq: Four times a day (QID) | ORAL | Status: DC | PRN

## 2017-09-16 MED ORDER — ACETAMINOPHEN 325 MG PO TABS
650.0000 mg | ORAL_TABLET | Freq: Four times a day (QID) | ORAL | Status: DC | PRN
  Filled 2017-09-16: qty 2

## 2017-09-16 MED ORDER — LEVOTHYROXINE SODIUM 25 MCG PO TABS
25.0000 ug | ORAL_TABLET | Freq: Every day | ORAL | Status: DC
  Administered 2017-09-17: 25 ug via ORAL
  Filled 2017-09-16 (×4): qty 1

## 2017-09-16 MED ORDER — LORAZEPAM 2 MG/ML IJ SOLN
0.5000 mg | Freq: Four times a day (QID) | INTRAMUSCULAR | Status: DC | PRN
  Administered 2017-09-20: 01:00:00 0.5 mg via INTRAVENOUS
  Filled 2017-09-16 (×2): qty 1

## 2017-09-16 MED ORDER — ACETAMINOPHEN 650 MG RE SUPP: 650.0000 mg | Freq: Four times a day (QID) | RECTAL | Status: DC | PRN

## 2017-09-16 MED ORDER — PENTAFLUOROPROP-TETRAFLUOROETH EX AERO
INHALATION_SPRAY | CUTANEOUS | Status: AC
  Administered 2017-09-16: 04:00:00
  Filled 2017-09-16: qty 30

## 2017-09-16 MED ORDER — SEVELAMER CARBONATE 800 MG PO TABS
800.0000 mg | ORAL_TABLET | Freq: Three times a day (TID) | ORAL | Status: DC
  Administered 2017-09-17 – 2017-09-22 (×2): 800 mg via ORAL
  Filled 2017-09-16 (×24): qty 1

## 2017-09-16 MED ORDER — CEFDINIR 300 MG PO CAPS
300.0000 mg | ORAL_CAPSULE | ORAL | Status: DC
  Administered 2017-09-17: 300 mg via ORAL
  Filled 2017-09-16 (×3): qty 1

## 2017-09-16 MED ORDER — LORAZEPAM 2 MG/ML IJ SOLN
1.0000 mg | Freq: Once | INTRAMUSCULAR | Status: AC
  Administered 2017-09-16: 1 mg via INTRAVENOUS
  Filled 2017-09-16: qty 1

## 2017-09-16 MED ORDER — DOCUSATE SODIUM 100 MG PO CAPS
100.0000 mg | ORAL_CAPSULE | Freq: Two times a day (BID) | ORAL | Status: DC
  Administered 2017-09-17 – 2017-09-19 (×4): 100 mg via ORAL
  Filled 2017-09-16 (×7): qty 1

## 2017-09-16 MED ORDER — PRO-STAT SUGAR FREE PO LIQD
30.0000 mL | Freq: Two times a day (BID) | ORAL | Status: DC
  Administered 2017-09-17: 30 mL via ORAL

## 2017-09-16 MED ORDER — HYDROCORTISONE 1 % EX CREA
1.0000 "application " | TOPICAL_CREAM | Freq: Three times a day (TID) | CUTANEOUS | Status: DC | PRN
  Filled 2017-09-16: qty 28

## 2017-09-16 MED ORDER — LIDOCAINE 4 % EX CREA
1.0000 "application " | TOPICAL_CREAM | CUTANEOUS | Status: DC
  Filled 2017-09-16: qty 5

## 2017-09-16 MED ORDER — METOPROLOL SUCCINATE ER 25 MG PO TB24
25.0000 mg | ORAL_TABLET | Freq: Every day | ORAL | Status: DC
  Filled 2017-09-16 (×3): qty 1

## 2017-09-16 MED ORDER — HYDROXYZINE HCL 25 MG PO TABS
12.5000 mg | ORAL_TABLET | Freq: Three times a day (TID) | ORAL | Status: DC
  Administered 2017-09-17 – 2017-09-22 (×6): 12.5 mg via ORAL
  Filled 2017-09-16 (×24): qty 1

## 2017-09-16 NOTE — Progress Notes (Signed)
Pt bed alarm sounded approximately 9:00 a.m. RN was in Rm 129 when bed alarm of Sharon [name] sounded Rm 130. Upon entering room pt was standing and attempting to walk. Request pt to sit down. Pt refused. Pt unbalanced in stance. Lowered pt to bed following loss of balance. Pt repeated attempted to get up refusing to lay back in bed. Pt showed a definitely lack of safety awareness. She repaetedly attempted to remove cervical collar .  MD notified. One time order of Ativan IV ordered and administrated. Pt appeared to calm. Pt continued to bend over bed rails on left side of bed with head leaning towards floor. Pt has falling multiple times and is admitted due to fall. MD order PRN ativan following progression round. Floor mats were placed on both sides of pt bed. Low bed and low air loss mattress order. Per secretary Low air loss mattress will arrive during next shift. Oncoming RN aware of order.

## 2017-09-16 NOTE — Progress Notes (Addendum)
Two attempts to complete ordered Flexion extension images of cervical spine. Xray notified myself Velna Hatchet M. RN) that they were unable to obtain image and were going to cancel order. Attending MD was  Notified via text page, on call neurosurery MD called. Velna Hatchet, RN spoke with Sherrine Maples, RN  to notify MD of inability to complete order.

## 2017-09-16 NOTE — ED Provider Notes (Signed)
[facility] Emergency Department Provider Note   ____________________________________________   First MD Initiated Contact with Patient 09/16/17 [phone]     (approximate)  I have reviewed the triage vital signs and the nursing notes.   HISTORY  Chief Complaint Fall  Level 5 caveat: History limited by memory loss  HPI Sharon [name] is a 84 y.o. female brought to the ED from skilled nursing facility status post unwitnessed fall with scalp laceration.  Patient is on continuous oxygenation for CHF.  Reportedly fell forward from a wheelchair.  Does not remember if she suffered LOC.  Review of MAR does not indicate patient is on an anticoagulant.  Rest of history is unobtainable secondary to patient's dementia.   Past Medical History:  Diagnosis Date  . Anemia   . Anxiety   . Arthritis   . CAD (coronary artery disease)   . Cancer ([place])    skin  . Cardiomyopathy ([place])   . CHF (congestive heart failure) ([place])   . Depression   . IBS (irritable bowel syndrome) 2010  . Kidney failure July 2012   Hemodialysis 3xweek  . Kidney failure   . Meniere disease   . Meniere's disease   . Myocardial infarction ([place])   . OSA (obstructive sleep apnea)    CPAP  . Peritoneal dialysis status ([place])   . Presence of IVC filter 2019  . Presence of permanent cardiac pacemaker   . Renal insufficiency     Patient Active Problem List   Diagnosis Date Noted  . Left leg cellulitis 08/30/2017  . Elevated troponin 06/28/2017  . Swelling of limb 06/08/2017  . C. difficile diarrhea   . Palliative care encounter   . Encounter for hospice care discussion   . Protein-calorie malnutrition, severe 05/25/2017  . HCAP (healthcare-associated pneumonia) 05/24/2017  . Complication from renal dialysis device 04/01/2017  . Displaced fracture of lateral end of right clavicle, initial encounter for closed fracture 02/16/2017  . Failure to thrive in adult 02/03/2017  . ESRD on  hemodialysis ([place])   . Pleural effusion   . Palliative care by specialist   . Pulmonary embolism ([place]) 01/02/2017  . Syncope 01/01/2017  . Vision changes 08/27/2016  . Closed fracture of neck of femur ([place]) 05/26/2016  . Hip fracture ([place]) 05/10/2016  . Falls 04/27/2016  . Head injury 04/27/2016  . Age-related osteoporosis with current pathological fracture with routine healing 04/24/2016  . Recurrent falls 04/24/2016  . Mild protein-calorie malnutrition ([place]) 04/24/2016  . ESRD on dialysis ([place]) 04/10/2016  . Periprosthetic fracture around internal prosthetic left knee joint 01/26/2016  . Pressure injury of skin 12/15/2015  . Closed left subtrochanteric femur fracture ([place]) 12/15/2015  . Femur fracture, left ([place]) 12/14/2015  . Meniere disease   . Loss of weight 10/21/2015  . Clinical depression 06/20/2015  . Dysphagia 05/24/2015  . Imbalance 04/22/2015  . Chronic pain syndrome 05/22/2014  . Insomnia 03/13/2014  . Anxiety state 03/13/2014  . Chronic systolic CHF (congestive heart failure), NYHA class 3 ([place]) 01/15/2014  . OSA (obstructive sleep apnea) 01/15/2014  . Basal cell carcinoma of neck 11/14/2013  . DDD (degenerative disc disease), cervical 11/07/2013  . Cervical radiculitis 10/16/2013  . Benign essential hypertension 09/12/2013  . Memory loss 09/12/2013  . Systolic heart failure, chronic ([place]) 05/12/2013  . Goals of care, counseling/discussion 11/10/2012  . Sinus node dysfunction ([place]) 08/01/2012  . Low back pain 08/01/2012  . Cervical spine pain 05/02/2012  . Irritable bowel syndrome  11/02/2011  . Depression 04/01/2011  . Hypertension 04/01/2011    Past Surgical History:  Procedure Laterality Date  . [place]  . ABDOMINAL HYSTERECTOMY    . ANUS SURGERY  2010  . AV FISTULA PLACEMENT Right 04/15/2016   Procedure: ARTERIOVENOUS (AV) FISTULA CREATION ( RADIOCEPHALIC ) STAGE 2;  Surgeon: Algernon Huxley, MD;  Location: [facility] ORS;  Service:  Vascular;  Laterality: Right;  . CATARACT EXTRACTION  2006, 2011  . CHOLECYSTECTOMY  01/2008  . DIALYSIS/PERMA CATHETER INSERTION N/A 01/07/2017   Procedure: DIALYSIS/PERMA CATHETER INSERTION With an IVC filter placement;  Surgeon: Algernon Huxley, MD;  Location: [place] [facility];  Service: Cardiovascular;  Laterality: N/A;  . DIALYSIS/PERMA CATHETER REMOVAL N/A 08/20/2016   Procedure: Dialysis/Perma Catheter Removal;  Surgeon: Algernon Huxley, MD;  Location: [place] [facility];  Service: Cardiovascular;  Laterality: N/A;  . DIALYSIS/PERMA CATHETER REMOVAL N/A 08/24/2017   Procedure: DIALYSIS/PERMA CATHETER REMOVAL;  Surgeon: Katha Cabal, MD;  Location: [place] [facility];  Service: Cardiovascular;  Laterality: N/A;  . EYE SURGERY     cataracts bilateral  . FEMUR IM NAIL Left 12/15/2015   Procedure: INTRAMEDULLARY (IM) RETROGRADE FEMORAL NAILING;  Surgeon: Oletta Cohn, DO;  Location: [facility] OR[facility];  Service: Orthopedics;  Laterality: Left;  . HIP PINNING,CANNULATED Left 05/10/2016   Procedure: CANNULATED HIP PINNING;  Surgeon: Earnestine Leys, MD;  Location: [facility] OR[facility];  Service: Orthopedics;  Laterality: Left;  . INSERT / REPLACE / REMOVE PACEMAKER    . INSERTION OF DIALYSIS CATHETER  07/2010  . IVC FILTER INSERTION  2019  . JOINT REPLACEMENT     left knee  . MINOR REMOVAL OF PERITONEAL DIALYSIS CATHETER  04/15/2016   Procedure: MINOR REMOVAL OF PERITONEAL DIALYSIS CATHETER;  Surgeon: Algernon Huxley, MD;  Location: [facility] OR[facility];  Service: Vascular;;  . ORIF CLAVICULAR FRACTURE Right 02/16/2017   Procedure: OPEN REDUCTION INTERNAL FIXATION (ORIF) CLAVICULAR FRACTURE;  Surgeon: Corky Mull, MD;  Location: [facility] OR[facility];  Service: Orthopedics;  Laterality: Right;  . PACEMAKER INSERTION  2006  . PERIPHERAL VASCULAR CATHETERIZATION N/A 12/18/2015   Procedure: Dialysis/Perma Catheter Insertion;  Surgeon: Katha Cabal, MD;  Location: [place] [facility];  Service: Cardiovascular;  Laterality: N/A;  .  TOTAL KNEE ARTHROPLASTY  2008   LEFT/Dr [place]    Prior to Admission medications   Medication Sig Start Date End Date Taking? Authorizing Provider  acetaminophen (TYLENOL) 325 MG tablet Take 2 tablets (650 mg total) by mouth every 4 (four) hours as needed for mild pain or moderate pain. 05/14/16  Yes Wieting, Richard, MD  cefdinir (OMNICEF) 300 MG capsule Take 1 capsule (300 mg total) by mouth every other day for 14 days. 09/08/17 09/22/17 Yes Gouru, Aruna, MD  guaiFENesin-dextromethorphan (ROBITUSSIN DM) 100-10 MG/5ML syrup Take 5 mLs by mouth every 4 (four) hours as needed for cough. 06/01/17  Yes Dustin Flock, MD  hydrocortisone 2.5 % cream Apply 1 application topically 3 (three) times daily.    Yes [provider]  hydrOXYzine (ATARAX/VISTARIL) 25 MG tablet Take 12.5 mg by mouth 3 (three) times daily.   Yes [provider]  latanoprost (XALATAN) 0.005 % ophthalmic solution Place 1 drop into both eyes at bedtime.   Yes [provider]  levothyroxine (SYNTHROID, LEVOTHROID) 25 MCG tablet Take 1 tablet (25 mcg total) by mouth daily. 02/04/17  Yes Bettey Costa, MD  lidocaine (LMX) 4 % cream Apply 1 application topically every Monday, Wednesday, and  Friday with hemodialysis.   Yes [provider]  metoprolol succinate (TOPROL-XL) 25 MG 24 hr tablet Take 1 tablet (25 mg total) by mouth daily. 06/01/17  Yes Dustin Flock, MD  multivitamin (RENA-VIT) TABS tablet Take 1 tablet by mouth at bedtime. 09/07/17  Yes Gouru, Aruna, MD  ondansetron (ZOFRAN) 4 MG tablet Take 1 tablet (4 mg total) by mouth every 6 (six) hours as needed for nausea. 09/07/17  Yes Gouru, Illene Silver, MD  sevelamer carbonate (RENVELA) 800 MG tablet Take 1 tablet (800 mg total) by mouth 3 (three) times daily with meals. 06/01/17  Yes Dustin Flock, MD  traMADol (ULTRAM) 50 MG tablet Take 1-2 tablets (50-100 mg total) by mouth every 6 (six) hours as needed for moderate pain. 09/07/17  Yes Gouru, Illene Silver, MD    vitamin C (VITAMIN C) 250 MG tablet Take 1 tablet (250 mg total) by mouth 2 (two) times daily. 09/07/17  Yes Gouru, Illene Silver, MD  Vitamin D, Ergocalciferol, (DRISDOL) 50000 units CAPS capsule Take 50,000 Units by mouth every Monday.  04/17/15  Yes [provider]  Amino Acids-Protein Hydrolys (FEEDING SUPPLEMENT, PRO-STAT SUGAR FREE 64,) LIQD Take 30 mLs by mouth 2 (two) times daily. 09/07/17   Gouru, Illene Silver, MD  multivitamin-lutein (OCUVITE-LUTEIN) CAPS capsule Take 1 capsule by mouth daily. Patient not taking: Reported on 09/16/2017 09/07/17   Nicholes Mango, MD    Allergies Statins; Codeine sulfate; Codeine; Effexor [venlafaxine]; and Ezetimibe-simvastatin  Family History  Problem Relation Age of [place]  . Cancer Mother        ? ovarian - sarcoma  . Cancer Father        Skin cancer  . Diabetes Sister   . COPD Sister   . Depression Sister   . Cancer Sister        Lung - 53 yrs old    Social History Social History   Tobacco Use  . Smoking status: Former Smoker    Last attempt to quit: 03/31/1948    Years since quitting: 69.5  . Smokeless tobacco: Never Used  Substance Use Topics  . Alcohol use: No  . Drug use: No    Review of Systems  Constitutional: No fever/chills Eyes: No visual changes. ENT: No sore throat. Cardiovascular: Denies chest pain. Respiratory: Denies shortness of breath. Gastrointestinal: No abdominal pain.  No nausea, no vomiting.  No diarrhea.  No constipation. Genitourinary: Negative for dysuria. Musculoskeletal: Negative for back pain. Skin: Negative for rash. Neurological: Positive for scalp laceration.  Negative for headaches, focal weakness or numbness.   ____________________________________________   PHYSICAL EXAM:  VITAL SIGNS: ED Triage Vitals  Enc Vitals Group     BP      Pulse      Resp      Temp      Temp src      SpO2      Weight      Height      Head Circumference      Peak Flow      Pain Score      Pain Loc      Pain  Edu?      Excl. in [place]?     Constitutional: Alert and oriented.  Chronically ill appearing and in mild acute distress. Eyes: Conjunctivae are normal. PERRL. EOMI. Head: Approximately 1 cm linear laceration to right parietal scalp; mild venous oozing noted. Nose: No external evidence of injury. Mouth/Throat: Mucous membranes are moist.  No dental malocclusion. Neck: No stridor.  No cervical spine tenderness to palpation.  No step-offs or deformities. Cardiovascular: Normal rate, regular rhythm. Grossly normal heart sounds.  Good peripheral circulation. Respiratory: Increased respiratory effort.  No retractions. Lungs bibasilar rales. Gastrointestinal: Soft and nontender. No distention. No abdominal bruits. No CVA tenderness. Musculoskeletal: Pelvis stable.  RLE cellulitis which is currently being treated with Cefdinir.  No lower extremity tenderness nor edema.  No joint effusions. Neurologic: Alert and oriented to person and place.  Normal speech and language. No gross focal neurologic deficits are appreciated. MAEx4. Skin:  Skin is warm, dry and intact. No rash noted. Psychiatric: Mood and affect are normal. Speech and behavior are normal.  ____________________________________________   LABS (all labs ordered are listed, but only abnormal results are displayed)  Labs Reviewed  CBC - Abnormal; Notable for the following components:      Result Value   RBC 3.68 (*)    Hemoglobin 11.7 (*)    RDW 15.9 (*)    All other components within normal limits  TROPONIN I - Abnormal; Notable for the following components:   Troponin I 0.11 (*)    All other components within normal limits  COMPREHENSIVE METABOLIC PANEL - Abnormal; Notable for the following components:   BUN 43 (*)    Creatinine, Ser 3.57 (*)    Calcium 8.4 (*)    GFR calc non Af Amer 10 (*)    GFR calc Af Amer 12 (*)    All other components within normal limits  URINALYSIS, COMPLETE (UACMP) WITH MICROSCOPIC    ____________________________________________  EKG  ED ECG REPORT I, SUNG,JADE J, the attending physician, personally viewed and interpreted this ECG.   Date: 09/16/2017  EKG Time: 0111  Rate: 82  Rhythm: Paced rhythm  Axis: Paced  Intervals:Paced  ST&T Change: Paced  ____________________________________________  RADIOLOGY  ED MD interpretation: No ICH, C7 spinous process fracture Chest x-ray demonstrates large right pleural effusion  Official radiology report(s): Ct Head Wo Contrast  Result Date: 09/16/2017 CLINICAL DATA:  Fall with right parietal laceration. EXAM: CT HEAD WITHOUT CONTRAST CT CERVICAL SPINE WITHOUT CONTRAST TECHNIQUE: Multidetector CT imaging of the head and cervical spine was performed following the standard protocol without intravenous contrast. Multiplanar CT image reconstructions of the cervical spine were also generated. COMPARISON:  Multiple priors most recently 06/28/2017 FINDINGS: CT HEAD FINDINGS Brain: Unchanged degree of atrophy and chronic small vessel ischemia. No intracranial hemorrhage, mass effect, or midline shift. No hydrocephalus. The basilar cisterns are patent. No evidence of territorial infarct or acute ischemia. No extra-axial or intracranial fluid collection. Vascular: Atherosclerosis of skullbase vasculature without hyperdense vessel or abnormal calcification. Skull: No fracture or focal lesion. Sinuses/Orbits: Paranasal sinuses and mastoid air cells are clear. The visualized orbits are unremarkable. Bilateral cataract resection. Other: Right frontoparietal scalp hematoma. CT CERVICAL SPINE FINDINGS Alignment: No traumatic subluxation. Degenerative anterolisthesis of C4 on C5, unchanged from prior. Skull base and vertebrae: Comminuted mildly displaced fracture of the spinous process of C7 does not extend to the lamina or canal cortex. No additional acute fracture. Dens and skull base are intact. Soft tissues and spinal canal: No prevertebral  fluid or swelling. No visible canal hematoma. Disc levels: Similar degree of degenerative disc disease and facet arthropathy from prior exam. Disc space narrowing and endplate spurring most prominent at C5-C6 and C6-C7. Upper chest: Large right pleural effusion partially included. Other: Carotid calcifications. IMPRESSION: 1. No acute intracranial abnormality. No skull fracture. Right frontoparietal scalp hematoma. 2. Acute mildly comminuted and displaced C7  spinous process fracture. No additional fracture or subluxation of the cervical spine. 3. Large right pleural effusion, partially included. These results were called by telephone at the time of interpretation on 09/16/2017 at 2:20 am to Dr. Lurline Hare , who verbally acknowledged these results. Electronically Signed   By: Jeb Levering M.D.   On: 09/16/2017 02:20   Ct Cervical Spine Wo Contrast  Result Date: 09/16/2017 CLINICAL DATA:  Fall with right parietal laceration. EXAM: CT HEAD WITHOUT CONTRAST CT CERVICAL SPINE WITHOUT CONTRAST TECHNIQUE: Multidetector CT imaging of the head and cervical spine was performed following the standard protocol without intravenous contrast. Multiplanar CT image reconstructions of the cervical spine were also generated. COMPARISON:  Multiple priors most recently 06/28/2017 FINDINGS: CT HEAD FINDINGS Brain: Unchanged degree of atrophy and chronic small vessel ischemia. No intracranial hemorrhage, mass effect, or midline shift. No hydrocephalus. The basilar cisterns are patent. No evidence of territorial infarct or acute ischemia. No extra-axial or intracranial fluid collection. Vascular: Atherosclerosis of skullbase vasculature without hyperdense vessel or abnormal calcification. Skull: No fracture or focal lesion. Sinuses/Orbits: Paranasal sinuses and mastoid air cells are clear. The visualized orbits are unremarkable. Bilateral cataract resection. Other: Right frontoparietal scalp hematoma. CT CERVICAL SPINE FINDINGS  Alignment: No traumatic subluxation. Degenerative anterolisthesis of C4 on C5, unchanged from prior. Skull base and vertebrae: Comminuted mildly displaced fracture of the spinous process of C7 does not extend to the lamina or canal cortex. No additional acute fracture. Dens and skull base are intact. Soft tissues and spinal canal: No prevertebral fluid or swelling. No visible canal hematoma. Disc levels: Similar degree of degenerative disc disease and facet arthropathy from prior exam. Disc space narrowing and endplate spurring most prominent at C5-C6 and C6-C7. Upper chest: Large right pleural effusion partially included. Other: Carotid calcifications. IMPRESSION: 1. No acute intracranial abnormality. No skull fracture. Right frontoparietal scalp hematoma. 2. Acute mildly comminuted and displaced C7 spinous process fracture. No additional fracture or subluxation of the cervical spine. 3. Large right pleural effusion, partially included. These results were called by telephone at the time of interpretation on 09/16/2017 at 2:20 am to Dr. Lurline Hare , who verbally acknowledged these results. Electronically Signed   By: Jeb Levering M.D.   On: 09/16/2017 02:20   Dg Chest Port 1 View  Result Date: 09/16/2017 CLINICAL DATA:  Fall, head laceration. EXAM: PORTABLE CHEST 1 VIEW COMPARISON:  Chest radiograph June 28, 2017 FINDINGS: Large RIGHT pleural effusion, increased from prior examination. No mediastinal shift. Persistent underlying consolidation. The cardiac silhouette is at least mildly enlarged, predominantly obscured. Calcified aortic arch. LEFT cardiac pacemaker. No pneumothorax. Osteopenia. RIGHT clavicle ORIF with nonunion. Interval removal of dialysis catheter. IMPRESSION: Large RIGHT pleural effusion increased from prior examination; underlying consolidation. Cardiomegaly. Aortic Atherosclerosis (ICD10-I70.0). Electronically Signed   By: Elon Alas M.D.   On: 09/16/2017 01:48     ____________________________________________   PROCEDURES  Procedure(s) performed:     Marland KitchenMarland KitchenLaceration Repair Date/Time: 09/16/2017 5:02 AM Performed by: Paulette Blanch, MD Authorized by: Paulette Blanch, MD   Consent:    Consent obtained:  Verbal   Consent given by:  Patient   Risks discussed:  Pain, poor cosmetic result and poor wound healing Anesthesia (see MAR for exact dosages):    Anesthesia method:  Topical application   Topical anesthesia: PainEase. Laceration details:    Location:  Scalp   Scalp location:  R parietal   Length (cm):  1 Repair type:    Repair type:  Simple Exploration:    Hemostasis achieved with:  Direct pressure   Wound exploration: entire depth of wound probed and visualized     Contaminated: no   Treatment:    Area cleansed with:  Saline   Amount of cleaning:  Standard   Irrigation solution:  Sterile saline   Irrigation method:  Tap   Visualized foreign bodies/material removed: no   Skin repair:    Repair method:  Staples   Number of staples:  2 Approximation:    Approximation:  Loose Post-procedure details:    Dressing:  Open (no dressing)   Patient tolerance of procedure:  Tolerated well, no immediate complications    Critical Care performed: Yes, see critical care note(s)  CRITICAL CARE Performed by: Paulette Blanch   Total critical care time: 45 minutes  Critical care time was exclusive of separately billable procedures and treating other patients.  Critical care was necessary to treat or prevent imminent or life-threatening deterioration.  Critical care was time spent personally by me on the following activities: development of treatment plan with patient and/or surrogate as well as nursing, discussions with consultants, evaluation of patient's response to treatment, examination of patient, obtaining history from patient or surrogate, ordering and performing treatments and interventions, ordering and review of laboratory studies,  ordering and review of radiographic studies, pulse oximetry and re-evaluation of patient's condition. ____________________________________________   INITIAL IMPRESSION / ASSESSMENT AND PLAN / ED COURSE  As part of my medical decision making, I reviewed the following data within the [place] notes reviewed and incorporated, Labs reviewed, EKG interpreted, Old chart reviewed, Radiograph reviewed and Notes from prior ED visits   84 year old female who presents status post unwitnessed fall from skilled nursing facility with scalp laceration.  Differential diagnosis includes but is not limited to [place], subdural hematoma, scalp laceration/contusion, infectious, metabolic etiologies, etc.  Will obtain CT head and cervical spine.  Will check basic lab work including urinalysis.  Will reassess.  Clinical Course as of Sep 16 248  Thu Sep 16, 2017  0238 Spoke with radiologist regarding cervical spinous process fracture.  Will speak with neurosurgery for recommendations.  If able, will admit patient to this facility for respiratory distress secondary to enlarged pleural effusion with neurosurgery consult for spinous process fracture.   [JS]  [id number] Discussed with Dr. Lacinda Axon from neurosurgery who does not recommend transfer to outside facility.  Placed in rigid collar if patient will tolerate; otherwise soft cervical collar.  Neurosurgery will consult in the morning.  Will discuss with hospitalist to evaluate patient in the emergency department for admission for respiratory distress secondary to pleural effusion.   [JS]    Clinical Course User Index [JS] Paulette Blanch, MD     ____________________________________________   FINAL CLINICAL IMPRESSION(S) / ED DIAGNOSES  Final diagnoses:  Respiratory distress  Pleural effusion  Fracture of cervical spinous process, initial encounter ([place])  Injury of head, initial encounter  Fall, initial encounter  Scalp laceration, initial  encounter     ED Discharge Orders    None       Note:  This document was prepared using Dragon voice recognition software and may include unintentional dictation errors.    Paulette Blanch, MD 09/16/17 [phone]

## 2017-09-16 NOTE — Progress Notes (Signed)
PT Cancellation Note  Patient Details [name] MRN: [id number] DOB: August 17, 1926   Cancelled Treatment:    Reason Eval/Treat Not Completed: Patient at procedure or test/unavailable Pt was agitated this AM and needed to receive ativan, was still waiting on neurosurgery at the time regarding C7 fx. Spoke with nurse who reports that pt is going down for MRI very soon.  Will hold PT today and assess tomorrow per appropriateness and clearance.   Kreg Shropshire, DPT 09/16/2017, 3:34 PM

## 2017-09-16 NOTE — Progress Notes (Signed)
OT Cancellation Note  Patient Details Name: DENIESE OBERRY MRN: [id number] DOB: 09-21-1926   Cancelled Treatment:    Reason Eval/Treat Not Completed: Medical issues which prohibited therapy.  Order received and chart reviewed.  Ms. Scalici is 84 y.o. female with displaced C7 spinous process fracture. Dr. Lacinda Axon was consulted and advised to apply cervical collar if tolerated. Also recommend flexion and extension xrays of cervical spine to evaluate for instability which have not yet been completed.  She received Ativan this morning due to agitation per chart review.  Will re-assess tomorrow for status for participation in OT evaluation based on findings of flexion and extension x-rays.  Chrys Racer, OTR/L ascom [phone] 09/16/17, 10:51 AM

## 2017-09-16 NOTE — Progress Notes (Addendum)
Pt brother called and updated approximately 8:00. Informed of ultrasound thoracentesis proceedure, scheduled and canceled xray of cervical flexion and extension. Informed pt. Of moving pt room. Informed of request for low bed with no loss air mattress.  Requested POA paperwork copy. Brother Refused and stated "I will bring another copy to the hospital" Discussed condition of skin  brother stated he was aware and that "she is a frequent faller" . Spoke of non compliance with safety precautions and  ordered medication.  Discussed plan of care. Discussed placement of pink foams to protect skin from further damage and explained wound consult ordered with 24 hours for wound care nurse to respond.  Answered question. Brother explained pt baseline includes ability to clearly communicate informations such as medications and to call out for help. Phone called aproximately 8:50. No questions remaining at close of call.

## 2017-09-16 NOTE — ED Triage Notes (Signed)
Pt arrived to the ED via EMS from [facility] for a laceration to the head secondary to a fall. According to EMS the Pt suffered an unwitnessed fall, face first from a wheel chair. Pt reports that she does not remember if she lost consciousness or not. Pt has a 2cm laceration to the right side of the head. Pt is AOx4 in no apparent distress.

## 2017-09-16 NOTE — NC FL2 (Signed)
[place] LEVEL OF CARE SCREENING TOOL     IDENTIFICATION  Patient [name] Birthdate: 13-Feb-1926 Sex: female Admission Date (Current Location): 09/16/2017  [place] and [place] Number:  Engineering geologist and Address:  [facility], [address], [place], [place] [address]      Provider Number: [id number]  Attending Physician Name and Address:  Vaughan Basta, *  Relative Name and Phone Number:       Current Level of Care: Hospital Recommended Level of Care: [place] Prior Approval Number:    Date Approved/Denied:   PASRR Number:    Discharge Plan: SNF    Current Diagnoses: Patient Active Problem List   Diagnosis Date Noted  . Fall 09/16/2017  . Left leg cellulitis 08/30/2017  . Elevated troponin 06/28/2017  . Swelling of limb 06/08/2017  . C. difficile diarrhea   . Palliative care encounter   . Encounter for hospice care discussion   . Protein-calorie malnutrition, severe 05/25/2017  . HCAP (healthcare-associated pneumonia) 05/24/2017  . Complication from renal dialysis device 04/01/2017  . Displaced fracture of lateral end of right clavicle, initial encounter for closed fracture 02/16/2017  . Failure to thrive in adult 02/03/2017  . ESRD on hemodialysis ([place])   . Pleural effusion   . Palliative care by specialist   . Pulmonary embolism ([place]) 01/02/2017  . Syncope 01/01/2017  . Vision changes 08/27/2016  . Closed fracture of neck of femur ([place]) 05/26/2016  . Hip fracture ([place]) 05/10/2016  . Falls 04/27/2016  . Head injury 04/27/2016  . Age-related osteoporosis with current pathological fracture with routine healing 04/24/2016  . Recurrent falls 04/24/2016  . Mild protein-calorie malnutrition ([place]) 04/24/2016  . ESRD on dialysis ([place]) 04/10/2016  . Periprosthetic fracture around internal prosthetic left knee joint 01/26/2016  . Pressure injury of skin 12/15/2015  . Closed left  subtrochanteric femur fracture ([place]) 12/15/2015  . Femur fracture, left ([place]) 12/14/2015  . Meniere disease   . Loss of weight 10/21/2015  . Clinical depression 06/20/2015  . Dysphagia 05/24/2015  . Imbalance 04/22/2015  . Chronic pain syndrome 05/22/2014  . Insomnia 03/13/2014  . Anxiety state 03/13/2014  . Chronic systolic CHF (congestive heart failure), NYHA class 3 ([place]) 01/15/2014  . OSA (obstructive sleep apnea) 01/15/2014  . Basal cell carcinoma of neck 11/14/2013  . DDD (degenerative disc disease), cervical 11/07/2013  . Cervical radiculitis 10/16/2013  . Benign essential hypertension 09/12/2013  . Memory loss 09/12/2013  . Systolic heart failure, chronic ([place]) 05/12/2013  . Goals of care, counseling/discussion 11/10/2012  . Sinus node dysfunction ([place]) 08/01/2012  . Low back pain 08/01/2012  . Cervical spine pain 05/02/2012  . Irritable bowel syndrome 11/02/2011  . Depression 04/01/2011  . Hypertension 04/01/2011    Orientation RESPIRATION BLADDER Height & Weight     Self, Place  Normal Continent Weight: 111 lb 12.4 oz (50.7 kg) Height:  5\' 4"  (162.6 cm)  BEHAVIORAL SYMPTOMS/MOOD NEUROLOGICAL BOWEL NUTRITION STATUS  (none) (None) Incontinent Diet(Renal diet with fluid restriction 1200 ml )  AMBULATORY STATUS COMMUNICATION OF NEEDS Skin   Extensive Assist Verbally Normal                       Personal Care Assistance Level of Assistance  Bathing, Feeding, Dressing Bathing Assistance: Limited assistance Feeding assistance: Independent Dressing Assistance: Limited assistance     Functional Limitations Info  Sight, Hearing, Speech Sight Info: Adequate Hearing Info: Adequate Speech  Info: Adequate    SPECIAL CARE FACTORS FREQUENCY  (Dialysis )                    Contractures Contractures Info: Not present    Additional Factors Info  Code Status, Allergies Code Status Info: DNR Allergies Info: Statins, Codeine Sulfate, Codeine, Effexor  Venlafaxine, Ezetimibe-simvastatin           Current Medications (09/16/2017):  This is the current hospital active medication list Current Facility-Administered Medications  Medication Dose Route Frequency Provider Last Rate Last Dose  . acetaminophen (TYLENOL) tablet 650 mg  650 mg Oral Q6H PRN Harrie Foreman, MD       Or  . acetaminophen (TYLENOL) suppository 650 mg  650 mg Rectal Q6H PRN Harrie Foreman, MD      . Derrill Memo ON 09/17/2017] cefdinir (OMNICEF) capsule 300 mg  300 mg Oral Matthias Hughs, MD      . docusate sodium (COLACE) capsule 100 mg  100 mg Oral BID Harrie Foreman, MD      . feeding supplement (PRO-STAT SUGAR FREE 64) liquid 30 mL  30 mL Oral BID Harrie Foreman, MD      . guaiFENesin-dextromethorphan (ROBITUSSIN DM) 100-10 MG/5ML syrup 5 mL  5 mL Oral Q4H PRN Harrie Foreman, MD      . hydrocortisone cream 1 % 1 application  1 application Topical TID PRN Harrie Foreman, MD      . hydrOXYzine (ATARAX/VISTARIL) tablet 12.5 mg  12.5 mg Oral TID Harrie Foreman, MD      . latanoprost (XALATAN) 0.005 % ophthalmic solution 1 drop  1 drop Both Eyes QHS Harrie Foreman, MD      . levothyroxine (SYNTHROID, LEVOTHROID) tablet 25 mcg  25 mcg Oral QAC breakfast Harrie Foreman, MD      . Derrill Memo ON 09/17/2017] lidocaine (LMX) 4 % cream 1 application  1 application Topical Q M,W,F-HD Harrie Foreman, MD      . metoprolol succinate (TOPROL-XL) 24 hr tablet 25 mg  25 mg Oral Daily Harrie Foreman, MD      . multivitamin (RENA-VIT) tablet 1 tablet  1 tablet Oral QHS Harrie Foreman, MD      . ondansetron [facility]) tablet 4 mg  4 mg Oral Q6H PRN Harrie Foreman, MD       Or  . ondansetron [facility]) injection 4 mg  4 mg Intravenous Q6H PRN Harrie Foreman, MD      . sevelamer carbonate (RENVELA) tablet 800 mg  800 mg Oral TID WC Harrie Foreman, MD      . traMADol Veatrice Bourbon) tablet 50-100 mg  50-100 mg Oral Q6H PRN Harrie Foreman, MD       . Derrill Memo ON 09/20/2017] Vitamin D (Ergocalciferol) (DRISDOL) capsule 50,000 Units  50,000 Units Oral Q Oletta Darter, MD       Facility-Administered Medications Ordered in Other Encounters  Medication Dose Route Frequency Provider Last Rate Last Dose  . ceFAZolin (ANCEF) IVPB 1 g/50 mL premix  1 g Intravenous Once Stegmayer, Janalyn Harder, PA-C         Discharge Medications: Please see discharge summary for a list of discharge medications.  Relevant Imaging Results:  Relevant Lab Results:   Additional Information    Daielle Melcher  Louretta Shorten, LCSWA

## 2017-09-16 NOTE — Procedures (Signed)
US thoracentesis without difficulty   Complications:  None  Blood Loss: none  See dictation in canopy pacs  

## 2017-09-16 NOTE — Progress Notes (Signed)
CRITICAL VALUE ALERT  Critical Value:  0.46 Troponin  Date & Time Notied: approximately 1850  Provider Notified: Ashok Norris, MD notified @19 :08 via text page in amnion  Orders Received/Actions taken: 1913 MD returned page no new orders. Instructed to continue to monitor

## 2017-09-16 NOTE — Consult Note (Signed)
Referring Physician:  No referring provider defined for this encounter.  Primary Physician:  Kirk Ruths, MD  Chief Complaint:  C7 spinous process fracture following fall  History of Present Illness: Sharon [name] is a 84 y.o. female with a history of dementia, CHF, ESRD, pacemaker placement, anesthesia complications.who presents as a consult in regards to C7 spinous process fracture sustained after a fall. Patient became agitated and aggressive with staff so atival was administered. Patient very sleepy and unable to provide adequate history. Per previous physician notes, she fell face forward out of wheelchair.   Review of Systems:  A 10 point review of systems is negative, except for the pertinent positives and negatives detailed in the HPI.  Past Medical History: Past Medical History:  Diagnosis Date  . Anemia   . Anxiety   . Arthritis   . CAD (coronary artery disease)   . Cancer ([place])    skin  . Cardiomyopathy ([place])   . CHF (congestive heart failure) ([place])   . Depression   . IBS (irritable bowel syndrome) 2010  . Kidney failure July 2012   Hemodialysis 3xweek  . Kidney failure   . Meniere disease   . Meniere's disease   . Myocardial infarction ([place])   . OSA (obstructive sleep apnea)    CPAP  . Peritoneal dialysis status ([place])   . Presence of IVC filter 2019  . Presence of permanent cardiac pacemaker   . Renal insufficiency     Past Surgical History: Past Surgical History:  Procedure Laterality Date  . [place]  . ABDOMINAL HYSTERECTOMY    . ANUS SURGERY  2010  . AV FISTULA PLACEMENT Right 04/15/2016   Procedure: ARTERIOVENOUS (AV) FISTULA CREATION ( RADIOCEPHALIC ) STAGE 2;  Surgeon: Algernon Huxley, MD;  Location: [facility] ORS;  Service: Vascular;  Laterality: Right;  . CATARACT EXTRACTION  2006, 2011  . CHOLECYSTECTOMY  01/2008  . DIALYSIS/PERMA CATHETER INSERTION N/A 01/07/2017   Procedure: DIALYSIS/PERMA CATHETER INSERTION With an  IVC filter placement;  Surgeon: Algernon Huxley, MD;  Location: [place] [facility];  Service: Cardiovascular;  Laterality: N/A;  . DIALYSIS/PERMA CATHETER REMOVAL N/A 08/20/2016   Procedure: Dialysis/Perma Catheter Removal;  Surgeon: Algernon Huxley, MD;  Location: [place] CV LAB;  Service: Cardiovascular;  Laterality: N/A;  . DIALYSIS/PERMA CATHETER REMOVAL N/A 08/24/2017   Procedure: DIALYSIS/PERMA CATHETER REMOVAL;  Surgeon: Katha Cabal, MD;  Location: [place] CV LAB;  Service: Cardiovascular;  Laterality: N/A;  . EYE SURGERY     cataracts bilateral  . FEMUR IM NAIL Left 12/15/2015   Procedure: INTRAMEDULLARY (IM) RETROGRADE FEMORAL NAILING;  Surgeon: Oletta Cohn, DO;  Location: [facility] ORS;  Service: Orthopedics;  Laterality: Left;  . HIP PINNING,CANNULATED Left 05/10/2016   Procedure: CANNULATED HIP PINNING;  Surgeon: Earnestine Leys, MD;  Location: [facility] ORS;  Service: Orthopedics;  Laterality: Left;  . INSERT / REPLACE / REMOVE PACEMAKER    . INSERTION OF DIALYSIS CATHETER  07/2010  . IVC FILTER INSERTION  2019  . JOINT REPLACEMENT     left knee  . MINOR REMOVAL OF PERITONEAL DIALYSIS CATHETER  04/15/2016   Procedure: MINOR REMOVAL OF PERITONEAL DIALYSIS CATHETER;  Surgeon: Algernon Huxley, MD;  Location: [facility] ORS;  Service: Vascular;;  . ORIF CLAVICULAR FRACTURE Right 02/16/2017   Procedure: OPEN REDUCTION INTERNAL FIXATION (ORIF) CLAVICULAR FRACTURE;  Surgeon: Corky Mull, MD;  Location: [facility] ORS;  Service: Orthopedics;  Laterality: Right;  . PACEMAKER  INSERTION  2006  . PERIPHERAL VASCULAR CATHETERIZATION N/A 12/18/2015   Procedure: Dialysis/Perma Catheter Insertion;  Surgeon: Katha Cabal, MD;  Location: [place] [facility] LAB;  Service: Cardiovascular;  Laterality: N/A;  . TOTAL KNEE ARTHROPLASTY  2008   LEFT/Dr [place]    Allergies: Allergies as of 09/16/2017 - Review Complete 09/16/2017  Allergen Reaction Noted  . Statins Other (See Comments) 11/15/2013  . Codeine  sulfate Nausea And Vomiting 09/18/2013  . Codeine Other (See Comments) 11/10/2012  . Effexor [venlafaxine] Nausea Only 01/16/2015  . Ezetimibe-simvastatin Other (See Comments) 09/18/2013    Medications:  Current Facility-Administered Medications:  .  acetaminophen (TYLENOL) tablet 650 mg, 650 mg, Oral, Q6H PRN **OR** acetaminophen (TYLENOL) suppository 650 mg, 650 mg, Rectal, Q6H PRN, Harrie Foreman, MD .  Derrill Memo ON 09/17/2017] cefdinir (OMNICEF) capsule 300 mg, 300 mg, Oral, QODAY, Harrie Foreman, MD .  docusate sodium (COLACE) capsule 100 mg, 100 mg, Oral, BID, Harrie Foreman, MD .  feeding supplement (PRO-STAT SUGAR FREE 64) liquid 30 mL, 30 mL, Oral, BID, Harrie Foreman, MD .  guaiFENesin-dextromethorphan (ROBITUSSIN DM) 100-10 MG/5ML syrup 5 mL, 5 mL, Oral, Q4H PRN, Harrie Foreman, MD .  hydrocortisone cream 1 % 1 application, 1 application, Topical, TID PRN, Harrie Foreman, MD .  hydrOXYzine (ATARAX/VISTARIL) tablet 12.5 mg, 12.5 mg, Oral, TID, Harrie Foreman, MD .  latanoprost (XALATAN) 0.005 % ophthalmic solution 1 drop, 1 drop, Both Eyes, QHS, Harrie Foreman, MD .  levothyroxine (SYNTHROID, LEVOTHROID) tablet 25 mcg, 25 mcg, Oral, QAC breakfast, Harrie Foreman, MD .  Derrill Memo ON 09/17/2017] lidocaine (LMX) 4 % cream 1 application, 1 application, Topical, Q M,W,F-HD, Harrie Foreman, MD .  metoprolol succinate (TOPROL-XL) 24 hr tablet 25 mg, 25 mg, Oral, Daily, Harrie Foreman, MD .  multivitamin (RENA-VIT) tablet 1 tablet, 1 tablet, Oral, QHS, Harrie Foreman, MD .  ondansetron (ZOFRAN) tablet 4 mg, 4 mg, Oral, Q6H PRN **OR** ondansetron (ZOFRAN) injection 4 mg, 4 mg, Intravenous, Q6H PRN, Harrie Foreman, MD .  sevelamer carbonate (RENVELA) tablet 800 mg, 800 mg, Oral, TID WC, Harrie Foreman, MD .  traMADol Veatrice Bourbon) tablet 50-100 mg, 50-100 mg, Oral, Q6H PRN, Harrie Foreman, MD .  Derrill Memo ON 09/20/2017] Vitamin D (Ergocalciferol)  (DRISDOL) capsule 50,000 Units, 50,000 Units, Oral, Q Mon, Harrie Foreman, MD  Facility-Administered Medications Ordered in Other Encounters:  .  ceFAZolin (ANCEF) IVPB 1 g/50 mL premix, 1 g, Intravenous, Once, Stegmayer, Janalyn Harder, PA-C   Social History: Social History   Tobacco Use  . Smoking status: Former Smoker    Last attempt to quit: 03/31/1948    Years since quitting: 69.5  . Smokeless tobacco: Never Used  Substance Use Topics  . Alcohol use: No  . Drug use: No    Family Medical History: Family History  Problem Relation Age of Onset  . Cancer Mother        ? ovarian - sarcoma  . Cancer Father        Skin cancer  . Diabetes Sister   . COPD Sister   . Depression Sister   . Cancer Sister        Lung - 69 yrs old    Physical Examination: Vitals:   09/16/17 0919 09/16/17 0922  BP: 111/60 111/60  Pulse: 80 80  Resp:    Temp:    SpO2:       General: Patient is well developed,  well nourished, calm, collected, and in no apparent distress. Able to arouse, but sleepy.   Psychiatric: Patient is non-anxious.  Head:  Pupils equal, round, and reactive to light.   Neck:   Soft collar placed.   Respiratory: Patient is breathing without any difficulty.  Extremities: No edema.  Vascular: Palpable pulses in dorsal pedal vessels.  Skin:   On exposed skin, there are no abnormal skin lesions.  NEUROLOGICAL:  General: In no acute distress, sleepy from recently administered ativan  Awake, alert, oriented to person, place, and time.  Facial tone is symmetric.    Strength: Unable to assess. Patient too sleepy to obey commands.   Reflexes are 1+ and symmetric at the biceps, triceps, brachioradialis, patella and achilles. Clonus is not present.   Hoffman's is absent.  Imaging:  EXAM: CT HEAD WITHOUT CONTRAST  CT CERVICAL SPINE WITHOUT CONTRAST  TECHNIQUE: Multidetector CT imaging of the head and cervical spine was performed following the standard protocol  without intravenous contrast. Multiplanar CT image reconstructions of the cervical spine were also generated.  COMPARISON:  Multiple priors most recently 06/28/2017  FINDINGS: CT HEAD FINDINGS  Brain: Unchanged degree of atrophy and chronic small vessel ischemia. No intracranial hemorrhage, mass effect, or midline shift. No hydrocephalus. The basilar cisterns are patent. No evidence of territorial infarct or acute ischemia. No extra-axial or intracranial fluid collection.  Vascular: Atherosclerosis of skullbase vasculature without hyperdense vessel or abnormal calcification.  Skull: No fracture or focal lesion.  Sinuses/Orbits: Paranasal sinuses and mastoid air cells are clear. The visualized orbits are unremarkable. Bilateral cataract resection.  Other: Right frontoparietal scalp hematoma.  CT CERVICAL SPINE FINDINGS  Alignment: No traumatic subluxation. Degenerative anterolisthesis of C4 on C5, unchanged from prior.  Skull base and vertebrae: Comminuted mildly displaced fracture of the spinous process of C7 does not extend to the lamina or canal cortex. No additional acute fracture. Dens and skull base are intact.  Soft tissues and spinal canal: No prevertebral fluid or swelling. No visible canal hematoma.  Disc levels: Similar degree of degenerative disc disease and facet arthropathy from prior exam. Disc space narrowing and endplate spurring most prominent at C5-C6 and C6-C7.  Upper chest: Large right pleural effusion partially included.  Other: Carotid calcifications.  IMPRESSION: 1. No acute intracranial abnormality. No skull fracture. Right frontoparietal scalp hematoma. 2. Acute mildly comminuted and displaced C7 spinous process fracture. No additional fracture or subluxation of the cervical spine. 3. Large right pleural effusion, partially included.  Assessment and Plan: Ms. Vandermeulen is a pleasant 84 y.o. female with displaced C7 spinous  process fracture. Dr. Lacinda Axon was consulted and advised to apply cervical collar if tolerated. Also recommend flexion and extension xrays of cervical spine to evaluate for instability. Previous physician notes state no neurologic deficits, but was unable to obtain adequate exam on evaluation due to recent ativan administration.   Sharon Olp, PA-C Dept. of Neurosurgery

## 2017-09-16 NOTE — Plan of Care (Signed)

## 2017-09-16 NOTE — Clinical Social Work Note (Signed)
Clinical Social Work Assessment  Patient Details  [name] MRN: [id number] Date of Birth: Jul 09, 1926  Date of referral:  09/16/17               Reason for consult:  Facility Placement                Permission sought to share information with:  Case Manager, Customer service manager, Family Supports Permission granted to share information::  Yes, Verbal Permission Granted  Name::        Agency::     Relationship::     Contact Information:     Housing/Transportation Living arrangements for the past 2 months:  [place] of Information:  Patient Patient Interpreter Needed:  None Criminal Activity/Legal Involvement Pertinent to Current Situation/Hospitalization:  No - Comment as needed Significant Relationships:  Adult Children, Other Family Members Lives with:  Facility Resident Do you feel safe going back to the place where you live?  Yes Need for family participation in patient care:  Yes (Comment)  Care giving concerns:  Patient is a long term resident at [facility].    Social Worker assessment / plan:  CSW consulted for facility placement. CSW met with patient. CSW introduced self and explained role. Patient reports that she lives at [facility]. She states that she has lived there "a long time". Patient states that before moving to [facility] she lived with her brother who is her 82. Patient plans to return to [facility] when ready for discharge. CSW notified Magda Paganini at [facility] that patient is here and admitted. Magda Paganini states that patient can return when ready. CSW will continue to follow for discharge planning.   Employment status:  Retired Forensic scientist:  Medicare PT Recommendations:  Not assessed at this time [place] / Referral to community resources:  [place]  Patient/Family's Response to care:  Patient thanked CSW for assistance   Patient/Family's Understanding of and  Emotional Response to Diagnosis, Current Treatment, and Prognosis:  Patient is in agreement with discharge back to SNF   Emotional Assessment Appearance:  Appears stated age Attitude/Demeanor/Rapport:  Lethargic Affect (typically observed):  Calm, Quiet Orientation:  Oriented to Self, Oriented to Place Alcohol / Substance use:  Not Applicable Psych involvement (Current and /or in the community):  No (Comment)  Discharge Needs  Concerns to be addressed:  No discharge needs identified Readmission within the last 30 days:  No Current discharge risk:  None Barriers to Discharge:  Continued Medical Work up   [employer], [place] 09/16/2017, 10:56 AM

## 2017-09-16 NOTE — H&P (Addendum)
Sharon [name] is an 84 y.o. female.   Chief Complaint: Fall HPI: Patient with past medical history of CAD and MI status post pacemaker placement, DVT/PE status post IVC filter placement, CHF and end-stage renal disease on dialysis presents to the emergency department from the nursing home after a fall.  She does not remember the circumstances of her fall but admits that she has fallen in the past.  She reports feeling "body spasms" at times but is unsure if she has had seizures.  This encounter, the patient struck her head and sustained a laceration.   Staple closure in the emergency department.  Head CT was negative for acute intracranial process.  She is on chronic supplemental oxygen but required increased flow due to tachypnea.  Chest x-ray showed a right side pleural effusion increased in size from previous film, and laboratory evaluation was significant for elevated troponin.  Due to loss of consciousness and elevated troponin the emergency department staff called the hospitalist service for admission.  Past Medical History:  Diagnosis Date  . Anemia   . Anxiety   . Arthritis   . CAD (coronary artery disease)   . Cancer ([place])    skin  . Cardiomyopathy ([place])   . CHF (congestive heart failure) ([place])   . Depression   . IBS (irritable bowel syndrome) 2010  . Kidney failure July 2012   Hemodialysis 3xweek  . Kidney failure   . Meniere disease   . Meniere's disease   . Myocardial infarction ([place])   . OSA (obstructive sleep apnea)    CPAP  . Peritoneal dialysis status ([place])   . Presence of IVC filter 2019  . Presence of permanent cardiac pacemaker   . Renal insufficiency     Past Surgical History:  Procedure Laterality Date  . [place]  . ABDOMINAL HYSTERECTOMY    . ANUS SURGERY  2010  . AV FISTULA PLACEMENT Right 04/15/2016   Procedure: ARTERIOVENOUS (AV) FISTULA CREATION ( RADIOCEPHALIC ) STAGE 2;  Surgeon: Algernon Huxley, MD;  Location: [facility];  Service:  Vascular;  Laterality: Right;  . CATARACT EXTRACTION  2006, 2011  . CHOLECYSTECTOMY  01/2008  . DIALYSIS/PERMA CATHETER INSERTION N/A 01/07/2017   Procedure: DIALYSIS/PERMA CATHETER INSERTION With an IVC filter placement;  Surgeon: Algernon Huxley, MD;  Location: [place] [facility];  Service: Cardiovascular;  Laterality: N/A;  . DIALYSIS/PERMA CATHETER REMOVAL N/A 08/20/2016   Procedure: Dialysis/Perma Catheter Removal;  Surgeon: Algernon Huxley, MD;  Location: [place] [facility];  Service: Cardiovascular;  Laterality: N/A;  . DIALYSIS/PERMA CATHETER REMOVAL N/A 08/24/2017   Procedure: DIALYSIS/PERMA CATHETER REMOVAL;  Surgeon: Katha Cabal, MD;  Location: [place] [facility];  Service: Cardiovascular;  Laterality: N/A;  . EYE SURGERY     cataracts bilateral  . FEMUR IM NAIL Left 12/15/2015   Procedure: INTRAMEDULLARY (IM) RETROGRADE FEMORAL NAILING;  Surgeon: Oletta Cohn, DO;  Location: [facility];  Service: Orthopedics;  Laterality: Left;  . HIP PINNING,CANNULATED Left 05/10/2016   Procedure: CANNULATED HIP PINNING;  Surgeon: Earnestine Leys, MD;  Location: [facility];  Service: Orthopedics;  Laterality: Left;  . INSERT / REPLACE / REMOVE PACEMAKER    . INSERTION OF DIALYSIS CATHETER  07/2010  . IVC FILTER INSERTION  2019  . JOINT REPLACEMENT     left knee  . MINOR REMOVAL OF PERITONEAL DIALYSIS CATHETER  04/15/2016   Procedure: MINOR REMOVAL OF PERITONEAL DIALYSIS CATHETER;  Surgeon: Algernon Huxley, MD;  Location: [facility];  Service: Vascular;;  . ORIF CLAVICULAR FRACTURE Right 02/16/2017   Procedure: OPEN REDUCTION INTERNAL FIXATION (ORIF) CLAVICULAR FRACTURE;  Surgeon: Corky Mull, MD;  Location: [facility];  Service: Orthopedics;  Laterality: Right;  . PACEMAKER INSERTION  2006  . PERIPHERAL VASCULAR CATHETERIZATION N/A 12/18/2015   Procedure: Dialysis/Perma Catheter Insertion;  Surgeon: Katha Cabal, MD;  Location: [place] [facility];  Service: Cardiovascular;  Laterality: N/A;  .  TOTAL KNEE ARTHROPLASTY  2008   LEFT/Dr [place]    Family History  Problem Relation Age of Onset  . Cancer Mother        ? ovarian - sarcoma  . Cancer Father        Skin cancer  . Diabetes Sister   . COPD Sister   . Depression Sister   . Cancer Sister        Lung - 47 yrs old   Social History:  reports that she quit smoking about 69 years ago. She has never used smokeless tobacco. She reports that she does not drink alcohol or use drugs.  Allergies:  Allergies  Allergen Reactions  . Statins Other (See Comments)    Muscle weakness severe  . Codeine Sulfate Nausea And Vomiting  . Codeine Other (See Comments)    GI UPSET  . Effexor [Venlafaxine] Nausea Only  . Ezetimibe-Simvastatin Other (See Comments)    Muscle weakness    Medications Prior to Admission  Medication Sig Dispense Refill  . acetaminophen (TYLENOL) 325 MG tablet Take 2 tablets (650 mg total) by mouth every 4 (four) hours as needed for mild pain or moderate pain.    . cefdinir (OMNICEF) 300 MG capsule Take 1 capsule (300 mg total) by mouth every other day for 14 days. 7 capsule 0  . guaiFENesin-dextromethorphan (ROBITUSSIN DM) 100-10 MG/5ML syrup Take 5 mLs by mouth every 4 (four) hours as needed for cough. 118 mL 0  . hydrocortisone 2.5 % cream Apply 1 application topically 3 (three) times daily.     . hydrOXYzine (ATARAX/VISTARIL) 25 MG tablet Take 12.5 mg by mouth 3 (three) times daily.    Marland Kitchen latanoprost (XALATAN) 0.005 % ophthalmic solution Place 1 drop into both eyes at bedtime.    Marland Kitchen levothyroxine (SYNTHROID, LEVOTHROID) 25 MCG tablet Take 1 tablet (25 mcg total) by mouth daily.    Marland Kitchen lidocaine (LMX) 4 % cream Apply 1 application topically every Monday, Wednesday, and Friday with hemodialysis.    Marland Kitchen metoprolol succinate (TOPROL-XL) 25 MG 24 hr tablet Take 1 tablet (25 mg total) by mouth daily.    . multivitamin (RENA-VIT) TABS tablet Take 1 tablet by mouth at bedtime.  0  . ondansetron (ZOFRAN) 4 MG tablet Take 1  tablet (4 mg total) by mouth every 6 (six) hours as needed for nausea. 20 tablet 0  . sevelamer carbonate (RENVELA) 800 MG tablet Take 1 tablet (800 mg total) by mouth 3 (three) times daily with meals. 90 tablet 0  . traMADol (ULTRAM) 50 MG tablet Take 1-2 tablets (50-100 mg total) by mouth every 6 (six) hours as needed for moderate pain. 30 tablet 0  . vitamin C (VITAMIN C) 250 MG tablet Take 1 tablet (250 mg total) by mouth 2 (two) times daily.    . Vitamin D, Ergocalciferol, (DRISDOL) 50000 units CAPS capsule Take 50,000 Units by mouth every Monday.     . Amino Acids-Protein Hydrolys (FEEDING SUPPLEMENT, PRO-STAT SUGAR FREE 64,) LIQD Take 30 mLs by mouth 2 (  two) times daily. 900 mL 0    Results for orders placed or performed during the hospital encounter of 09/16/17 (from the past 48 hour(s))  CBC     Status: Abnormal   Collection Time: 09/16/17  1:18 AM  Result Value Ref Range   WBC 5.3 3.6 - 11.0 K/uL   RBC 3.68 (L) 3.80 - 5.20 MIL/uL   Hemoglobin 11.7 (L) 12.0 - 16.0 g/dL   HCT 35.6 35.0 - 47.0 %   MCV 96.8 80.0 - 100.0 fL   MCH 31.9 26.0 - 34.0 pg   MCHC 33.0 32.0 - 36.0 g/dL   RDW 15.9 (H) 11.5 - 14.5 %   Platelets 197 150 - 440 K/uL    Comment: Performed at [facility], [address] [place], [place] [address]  Troponin I     Status: Abnormal   Collection Time: 09/16/17  1:18 AM  Result Value Ref Range   Troponin I 0.11 (HH) <0.03 ng/mL    Comment: CRITICAL RESULT CALLED TO, READ BACK BY AND VERIFIED WITH CHRISTINA JAMES ON 09/16/17 AT 0158 JAG Performed at [facility], [address] [place], [place] [address]   Comprehensive metabolic panel     Status: Abnormal   Collection Time: 09/16/17  1:18 AM  Result Value Ref Range   Sodium 138 135 - 145 mmol/L   Potassium 3.5 3.5 - 5.1 mmol/L   Chloride 99 98 - 111 mmol/L   CO2 27 22 - 32 mmol/L   Glucose, Bld 88 70 - 99 mg/dL   BUN 43 (H) 8 - 23 mg/dL   Creatinine, Ser 3.57 (H) 0.44 - 1.00 mg/dL    Calcium 8.4 (L) 8.9 - 10.3 mg/dL   Total Protein 7.2 6.5 - 8.1 g/dL   Albumin 3.5 3.5 - 5.0 g/dL   AST 23 15 - 41 U/L   ALT 16 0 - 44 U/L   Alkaline Phosphatase 86 38 - 126 U/L   Total Bilirubin 1.1 0.3 - 1.2 mg/dL   GFR calc non Af Amer 10 (L) >60 mL/min   GFR calc Af Amer 12 (L) >60 mL/min    Comment: (NOTE) The eGFR has been calculated using the CKD EPI equation. This calculation has not been validated in all clinical situations. eGFR's persistently <60 mL/min signify possible Chronic Kidney Disease.    Anion gap 12 5 - 15    Comment: Performed at [facility], [address] [place], [place] [address]   Ct Head Wo Contrast  Result Date: 09/16/2017 CLINICAL DATA:  Fall with right parietal laceration. EXAM: CT HEAD WITHOUT CONTRAST CT CERVICAL SPINE WITHOUT CONTRAST TECHNIQUE: Multidetector CT imaging of the head and cervical spine was performed following the standard protocol without intravenous contrast. Multiplanar CT image reconstructions of the cervical spine were also generated. COMPARISON:  Multiple priors most recently 06/28/2017 FINDINGS: CT HEAD FINDINGS Brain: Unchanged degree of atrophy and chronic small vessel ischemia. No intracranial hemorrhage, mass effect, or midline shift. No hydrocephalus. The basilar cisterns are patent. No evidence of territorial infarct or acute ischemia. No extra-axial or intracranial fluid collection. Vascular: Atherosclerosis of skullbase vasculature without hyperdense vessel or abnormal calcification. Skull: No fracture or focal lesion. Sinuses/Orbits: Paranasal sinuses and mastoid air cells are clear. The visualized orbits are unremarkable. Bilateral cataract resection. Other: Right frontoparietal scalp hematoma. CT CERVICAL SPINE FINDINGS Alignment: No traumatic subluxation. Degenerative anterolisthesis of C4 on C5, unchanged from prior. Skull base and vertebrae: Comminuted mildly displaced fracture of the  spinous process of C7 does not  extend to the lamina or canal cortex. No additional acute fracture. Dens and skull base are intact. Soft tissues and spinal canal: No prevertebral fluid or swelling. No visible canal hematoma. Disc levels: Similar degree of degenerative disc disease and facet arthropathy from prior exam. Disc space narrowing and endplate spurring most prominent at C5-C6 and C6-C7. Upper chest: Large right pleural effusion partially included. Other: Carotid calcifications. IMPRESSION: 1. No acute intracranial abnormality. No skull fracture. Right frontoparietal scalp hematoma. 2. Acute mildly comminuted and displaced C7 spinous process fracture. No additional fracture or subluxation of the cervical spine. 3. Large right pleural effusion, partially included. These results were called by telephone at the time of interpretation on 09/16/2017 at 2:20 am to Dr. Lurline Hare , who verbally acknowledged these results. Electronically Signed   By: Jeb Levering M.D.   On: 09/16/2017 02:20   Ct Cervical Spine Wo Contrast  Result Date: 09/16/2017 CLINICAL DATA:  Fall with right parietal laceration. EXAM: CT HEAD WITHOUT CONTRAST CT CERVICAL SPINE WITHOUT CONTRAST TECHNIQUE: Multidetector CT imaging of the head and cervical spine was performed following the standard protocol without intravenous contrast. Multiplanar CT image reconstructions of the cervical spine were also generated. COMPARISON:  Multiple priors most recently 06/28/2017 FINDINGS: CT HEAD FINDINGS Brain: Unchanged degree of atrophy and chronic small vessel ischemia. No intracranial hemorrhage, mass effect, or midline shift. No hydrocephalus. The basilar cisterns are patent. No evidence of territorial infarct or acute ischemia. No extra-axial or intracranial fluid collection. Vascular: Atherosclerosis of skullbase vasculature without hyperdense vessel or abnormal calcification. Skull: No fracture or focal lesion. Sinuses/Orbits: Paranasal sinuses and mastoid air cells are clear.  The visualized orbits are unremarkable. Bilateral cataract resection. Other: Right frontoparietal scalp hematoma. CT CERVICAL SPINE FINDINGS Alignment: No traumatic subluxation. Degenerative anterolisthesis of C4 on C5, unchanged from prior. Skull base and vertebrae: Comminuted mildly displaced fracture of the spinous process of C7 does not extend to the lamina or canal cortex. No additional acute fracture. Dens and skull base are intact. Soft tissues and spinal canal: No prevertebral fluid or swelling. No visible canal hematoma. Disc levels: Similar degree of degenerative disc disease and facet arthropathy from prior exam. Disc space narrowing and endplate spurring most prominent at C5-C6 and C6-C7. Upper chest: Large right pleural effusion partially included. Other: Carotid calcifications. IMPRESSION: 1. No acute intracranial abnormality. No skull fracture. Right frontoparietal scalp hematoma. 2. Acute mildly comminuted and displaced C7 spinous process fracture. No additional fracture or subluxation of the cervical spine. 3. Large right pleural effusion, partially included. These results were called by telephone at the time of interpretation on 09/16/2017 at 2:20 am to Dr. Lurline Hare , who verbally acknowledged these results. Electronically Signed   By: Jeb Levering M.D.   On: 09/16/2017 02:20   Dg Chest Port 1 View  Result Date: 09/16/2017 CLINICAL DATA:  Fall, head laceration. EXAM: PORTABLE CHEST 1 VIEW COMPARISON:  Chest radiograph June 28, 2017 FINDINGS: Large RIGHT pleural effusion, increased from prior examination. No mediastinal shift. Persistent underlying consolidation. The cardiac silhouette is at least mildly enlarged, predominantly obscured. Calcified aortic arch. LEFT cardiac pacemaker. No pneumothorax. Osteopenia. RIGHT clavicle ORIF with nonunion. Interval removal of dialysis catheter. IMPRESSION: Large RIGHT pleural effusion increased from prior examination; underlying consolidation.  Cardiomegaly. Aortic Atherosclerosis (ICD10-I70.0). Electronically Signed   By: Elon Alas M.D.   On: 09/16/2017 01:48    Review of Systems  Constitutional: Negative for chills and fever.  HENT: Negative for sore throat and tinnitus.   Eyes: Negative for blurred vision and redness.  Respiratory: Negative for cough and shortness of breath.   Cardiovascular: Negative for chest pain, palpitations, orthopnea and PND.  Gastrointestinal: Negative for abdominal pain, diarrhea, nausea and vomiting.  Genitourinary: Negative for dysuria, frequency and urgency.  Musculoskeletal: Negative for joint pain and myalgias.  Skin: Negative for rash.       No lesions  Neurological: Negative for speech change, focal weakness and weakness.  Endo/Heme/Allergies: Does not bruise/bleed easily.       No temperature intolerance  Psychiatric/Behavioral: Negative for depression and suicidal ideas.    Blood pressure 109/66, pulse 92, temperature 97.6 F (36.4 C), temperature source Oral, resp. rate 20, height '5\' 4"'$  (1.626 m), weight 50.7 kg, SpO2 (!) 81 %. Physical Exam  Vitals reviewed. Constitutional: She is oriented to person, place, and time. She appears well-developed and well-nourished. No distress.  HENT:  Head: Normocephalic.  Mouth/Throat: Oropharynx is clear and moist.  Frontotemporal head laceration status post staple closure  Eyes: Pupils are equal, round, and reactive to light. Conjunctivae and EOM are normal. No scleral icterus.  Neck: Normal range of motion. Neck supple. No JVD present. No tracheal deviation present. No thyromegaly present.  Cardiovascular: Normal rate, regular rhythm and normal heart sounds. Exam reveals no gallop and no friction rub.  No murmur heard. Respiratory: Effort normal and breath sounds normal.  GI: Soft. Bowel sounds are normal. She exhibits no distension. There is no tenderness.  Genitourinary:  Genitourinary Comments: Deferred  Lymphadenopathy:    She has  no cervical adenopathy.  Neurological: She is alert and oriented to person, place, and time. No cranial nerve deficit. She exhibits normal muscle tone.  Skin: Skin is warm and dry. No rash noted. No erythema.  Psychiatric: She has a normal mood and affect. Her behavior is normal. Judgment and thought content normal.     Assessment/Plan This is an 84 year old female admitted for elevated troponin. 1.  Elevated troponin: Rule out myocardial ischemia.  EKG shows paced rhythm.  Troponin is less than previous lab values and the patient denies chest pain.  However she does have shortness of breath likely secondary to her pleural effusion.  Etiology of elevated troponin includes poor renal clearance, demand ischemia and/or possible seizure activity.  Continue to monitor telemetry.  Follow cardiac biomarkers.  Cardiology consult at the discretion of the primary team. 2.  Pleural effusion: Consider thoracentesis as I cannot find documentation regarding evaluation of recurrent effusion.  Differential diagnosis of etiology includes end-stage renal disease versus underlying pneumonia versus malignancy.  Oxygen saturations have improved after untangling cannula tubing.  Consult pulmonology. 3.  Fall: Fall precautions; PT/OT eval prior to discharge line 4.  ESRD: On hemodialysis Monday Wednesday Friday.  Consult nephrology for continuation of dialysis if needed. 5.  Cervical fracture: C7; emergency department physician discussed with neurosurgery placement of hard collar if tolerated or soft collar if necessary.  They will evaluate the patient today.  Fracture is stable.  She has no neurologic deficits. 6.  DVT prophylaxis: SCDs 7.  GI prophylaxis: None The patient is a DNR.  Time spent on admission orders and patient care approximately 45 minutes   Harrie Foreman, MD 09/16/2017, 5:17 AM

## 2017-09-16 NOTE — Consult Note (Signed)
[facility] Consultation      Assessment and Plan:  Large right pleural effusion with acute hypoxic respiratory failure. Chronic systolic heart failure End-stage renal disease on hemodialysis. Severe debility with multiple falls. History of DVT status post IVC filter.  --Recommend right-sided thoracentesis, by interventional radiology. --Continued management of chronic heart failure, end-stage renal disease, which may be contributing to the effusion. --Given poor prognosis recommend continued discussions with palliative care.   Date: 09/16/2017  MRN# [id number] Sharon [name] 05/20/1926  Referring Physician: Dr. Jolaine Artist is a 84 y.o. old female seen in consultation for chief complaint of:    Chief Complaint  Patient presents with  . Fall  . Laceration    HPI:  The patient is an 84 year old female recently discharged from the hospital on 09/07/2017 due to failure to thrive, severe peripheral vascular disease, left leg cellulitis.  Her history is significant for coronary artery disease, MI, status post pacemaker, history of DVT/PE status post IVC filter placement, CHF, end-stage renal disease on dialysis.  She was discharged back to [facility], palliative care conversations during previous admission failed to establish any reasonable goals of care.  Patient presents to the hospital earlier this morning for an unwitnessed fall with scalp laceration, apparently she fell forward from a wheelchair.  Subsequently was noted that the patient had elevated troponin, pleural effusion, C7 fracture, treated with collar. Currently the patient has diminished mental status, patient's RN tells me that she had some agitation earlier and had to be given Ativan.  Currently her oxygen saturation on room air is noted to be 84%, this increases to 83% with 2 L oxygen.  No further history could be obtained.  Chest x-ray imaging on 09/16/2017 personally reviewed, this  shows a large right-sided pleural effusion with right lung atelectasis.  This appears increased in size when compared with 2 previous CT chest on 5/22, and drastically increased from a chest x-ray on 05/25/2017.  Currently the patient's oxygen saturation remains at 92% on room air.  I had seen the patient during her previous acute admission on 06/24/2017, at that time was noted that she had severe OSA with multiple falls and fractures.  She was recommended to undergo split-night study, however the patient's nursing home canceled the study and instead ordered an home sleep study.  Echocardiogram 06/28/2017>> EF equals 25%, PA pressure of 57.  PMHX:   Past Medical History:  Diagnosis Date  . Anemia   . Anxiety   . Arthritis   . CAD (coronary artery disease)   . Cancer ([place])    skin  . Cardiomyopathy ([place])   . CHF (congestive heart failure) ([place])   . Depression   . IBS (irritable bowel syndrome) 2010  . Kidney failure July 2012   Hemodialysis 3xweek  . Kidney failure   . Meniere disease   . Meniere's disease   . Myocardial infarction ([place])   . OSA (obstructive sleep apnea)    CPAP  . Peritoneal dialysis status ([place])   . Presence of IVC filter 2019  . Presence of permanent cardiac pacemaker   . Renal insufficiency    Surgical Hx:  Past Surgical History:  Procedure Laterality Date  . [place]  . ABDOMINAL HYSTERECTOMY    . ANUS SURGERY  2010  . AV FISTULA PLACEMENT Right 04/15/2016   Procedure: ARTERIOVENOUS (AV) FISTULA CREATION ( RADIOCEPHALIC ) STAGE 2;  Surgeon: Algernon Huxley, MD;  Location: [facility];  Service: Vascular;  Laterality: Right;  . CATARACT EXTRACTION  2006, 2011  . CHOLECYSTECTOMY  01/2008  . DIALYSIS/PERMA CATHETER INSERTION N/A 01/07/2017   Procedure: DIALYSIS/PERMA CATHETER INSERTION With an IVC filter placement;  Surgeon: Algernon Huxley, MD;  Location: [place] [facility];  Service: Cardiovascular;  Laterality: N/A;  . DIALYSIS/PERMA CATHETER  REMOVAL N/A 08/20/2016   Procedure: Dialysis/Perma Catheter Removal;  Surgeon: Algernon Huxley, MD;  Location: [place] [facility];  Service: Cardiovascular;  Laterality: N/A;  . DIALYSIS/PERMA CATHETER REMOVAL N/A 08/24/2017   Procedure: DIALYSIS/PERMA CATHETER REMOVAL;  Surgeon: Katha Cabal, MD;  Location: [place] [facility];  Service: Cardiovascular;  Laterality: N/A;  . EYE SURGERY     cataracts bilateral  . FEMUR IM NAIL Left 12/15/2015   Procedure: INTRAMEDULLARY (IM) RETROGRADE FEMORAL NAILING;  Surgeon: Oletta Cohn, DO;  Location: [facility];  Service: Orthopedics;  Laterality: Left;  . HIP PINNING,CANNULATED Left 05/10/2016   Procedure: CANNULATED HIP PINNING;  Surgeon: Earnestine Leys, MD;  Location: [facility];  Service: Orthopedics;  Laterality: Left;  . INSERT / REPLACE / REMOVE PACEMAKER    . INSERTION OF DIALYSIS CATHETER  07/2010  . IVC FILTER INSERTION  2019  . JOINT REPLACEMENT     left knee  . MINOR REMOVAL OF PERITONEAL DIALYSIS CATHETER  04/15/2016   Procedure: MINOR REMOVAL OF PERITONEAL DIALYSIS CATHETER;  Surgeon: Algernon Huxley, MD;  Location: [facility];  Service: Vascular;;  . ORIF CLAVICULAR FRACTURE Right 02/16/2017   Procedure: OPEN REDUCTION INTERNAL FIXATION (ORIF) CLAVICULAR FRACTURE;  Surgeon: Corky Mull, MD;  Location: [facility];  Service: Orthopedics;  Laterality: Right;  . PACEMAKER INSERTION  2006  . PERIPHERAL VASCULAR CATHETERIZATION N/A 12/18/2015   Procedure: Dialysis/Perma Catheter Insertion;  Surgeon: Katha Cabal, MD;  Location: [place] [facility];  Service: Cardiovascular;  Laterality: N/A;  . TOTAL KNEE ARTHROPLASTY  2008   LEFT/Dr [place]   Family Hx:  Family History  Problem Relation Age of Onset  . Cancer Mother        ? ovarian - sarcoma  . Cancer Father        Skin cancer  . Diabetes Sister   . COPD Sister   . Depression Sister   . Cancer Sister        Lung - 34 yrs old   Social Hx:   Social History   Tobacco Use  . Smoking  status: Former Smoker    Last attempt to quit: 03/31/1948    Years since quitting: 69.5  . Smokeless tobacco: Never Used  Substance Use Topics  . Alcohol use: No  . Drug use: No   Medication:    Current Facility-Administered Medications:  .  acetaminophen (TYLENOL) tablet 650 mg, 650 mg, Oral, Q6H PRN **OR** acetaminophen (TYLENOL) suppository 650 mg, 650 mg, Rectal, Q6H PRN, Harrie Foreman, MD .  Derrill Memo ON 09/17/2017] cefdinir (OMNICEF) capsule 300 mg, 300 mg, Oral, QODAY, Harrie Foreman, MD .  docusate sodium (COLACE) capsule 100 mg, 100 mg, Oral, BID, Harrie Foreman, MD .  feeding supplement (PRO-STAT SUGAR FREE 64) liquid 30 mL, 30 mL, Oral, BID, Harrie Foreman, MD .  guaiFENesin-dextromethorphan (ROBITUSSIN DM) 100-10 MG/5ML syrup 5 mL, 5 mL, Oral, Q4H PRN, Harrie Foreman, MD .  hydrocortisone cream 1 % 1 application, 1 application, Topical, TID PRN, Harrie Foreman, MD .  hydrOXYzine (ATARAX/VISTARIL) tablet 12.5 mg, 12.5 mg, Oral, TID, Harrie Foreman, MD .  latanoprost (XALATAN) 0.005 % ophthalmic solution 1 drop, 1 drop, Both Eyes, QHS, Harrie Foreman, MD .  levothyroxine (SYNTHROID, LEVOTHROID) tablet 25 mcg, 25 mcg, Oral, QAC breakfast, Harrie Foreman, MD .  Derrill Memo ON 09/17/2017] lidocaine (LMX) 4 % cream 1 application, 1 application, Topical, Q M,W,F-HD, Harrie Foreman, MD .  metoprolol succinate (TOPROL-XL) 24 hr tablet 25 mg, 25 mg, Oral, Daily, Harrie Foreman, MD .  multivitamin (RENA-VIT) tablet 1 tablet, 1 tablet, Oral, QHS, Harrie Foreman, MD .  ondansetron (ZOFRAN) tablet 4 mg, 4 mg, Oral, Q6H PRN **OR** ondansetron (ZOFRAN) injection 4 mg, 4 mg, Intravenous, Q6H PRN, Harrie Foreman, MD .  sevelamer carbonate (RENVELA) tablet 800 mg, 800 mg, Oral, TID WC, Harrie Foreman, MD .  traMADol Veatrice Bourbon) tablet 50-100 mg, 50-100 mg, Oral, Q6H PRN, Harrie Foreman, MD .  Derrill Memo ON 09/20/2017] Vitamin D (Ergocalciferol) (DRISDOL)  capsule 50,000 Units, 50,000 Units, Oral, Q Mon, Harrie Foreman, MD  Facility-Administered Medications Ordered in Other Encounters:  .  ceFAZolin (ANCEF) IVPB 1 g/50 mL premix, 1 g, Intravenous, Once, Stegmayer, Kimberly A, PA-C   Allergies:  Statins; Codeine sulfate; Codeine; Effexor [venlafaxine]; and Ezetimibe-simvastatin  Review of Systems: Could not give a review of systems due to reduced mental status.  Physical Examination:   VS: BP 111/60   Pulse 80   Temp 97.6 F (36.4 C) (Oral)   Resp 20   Ht 5\' 4"  (1.626 m)   Wt 50.7 kg   SpO2 (!) 81%   BMI 19.19 kg/m   General Appearance: No distress, sleepy Neuro:without focal findings HEENT: PERRLA, EOM intact.   Pulmonary: normal breath sounds, decreased air entry in the right lung.  Dullness to percussion in the right lung CardiovascularNormal S1,S2.  No m/r/g.   Abdomen: Benign, Soft, non-tender. Renal:  No costovertebral tenderness  GU:  No performed at this time. Endoc: No evident thyromegaly, no signs of acromegaly. Skin:   warm, no rashes, no ecchymosis  Extremities: normal, no cyanosis, clubbing.  Other findings:    LABORATORY PANEL:   CBC Recent Labs  Lab 09/16/17 0118  WBC 5.3  HGB 11.7*  HCT 35.6  PLT 197   ------------------------------------------------------------------------------------------------------------------  Chemistries  Recent Labs  Lab 09/16/17 0118  NA 138  K 3.5  CL 99  CO2 27  GLUCOSE 88  BUN 43*  CREATININE 3.57*  CALCIUM 8.4*  AST 23  ALT 16  ALKPHOS 86  BILITOT 1.1   ------------------------------------------------------------------------------------------------------------------  Cardiac Enzymes Recent Labs  Lab 09/16/17 0528  TROPONINI 0.11*   ------------------------------------------------------------  RADIOLOGY:  Ct Head Wo Contrast  Result Date: 09/16/2017 CLINICAL DATA:  Fall with right parietal laceration. EXAM: CT HEAD WITHOUT CONTRAST CT  CERVICAL SPINE WITHOUT CONTRAST TECHNIQUE: Multidetector CT imaging of the head and cervical spine was performed following the standard protocol without intravenous contrast. Multiplanar CT image reconstructions of the cervical spine were also generated. COMPARISON:  Multiple priors most recently 06/28/2017 FINDINGS: CT HEAD FINDINGS Brain: Unchanged degree of atrophy and chronic small vessel ischemia. No intracranial hemorrhage, mass effect, or midline shift. No hydrocephalus. The basilar cisterns are patent. No evidence of territorial infarct or acute ischemia. No extra-axial or intracranial fluid collection. Vascular: Atherosclerosis of skullbase vasculature without hyperdense vessel or abnormal calcification. Skull: No fracture or focal lesion. Sinuses/Orbits: Paranasal sinuses and mastoid air cells are clear. The visualized orbits are unremarkable. Bilateral cataract resection. Other: Right frontoparietal scalp hematoma. CT CERVICAL SPINE FINDINGS Alignment: No  traumatic subluxation. Degenerative anterolisthesis of C4 on C5, unchanged from prior. Skull base and vertebrae: Comminuted mildly displaced fracture of the spinous process of C7 does not extend to the lamina or canal cortex. No additional acute fracture. Dens and skull base are intact. Soft tissues and spinal canal: No prevertebral fluid or swelling. No visible canal hematoma. Disc levels: Similar degree of degenerative disc disease and facet arthropathy from prior exam. Disc space narrowing and endplate spurring most prominent at C5-C6 and C6-C7. Upper chest: Large right pleural effusion partially included. Other: Carotid calcifications. IMPRESSION: 1. No acute intracranial abnormality. No skull fracture. Right frontoparietal scalp hematoma. 2. Acute mildly comminuted and displaced C7 spinous process fracture. No additional fracture or subluxation of the cervical spine. 3. Large right pleural effusion, partially included. These results were called by  telephone at the time of interpretation on 09/16/2017 at 2:20 am to Dr. Lurline Hare , who verbally acknowledged these results. Electronically Signed   By: Jeb Levering M.D.   On: 09/16/2017 02:20   Ct Cervical Spine Wo Contrast  Result Date: 09/16/2017 CLINICAL DATA:  Fall with right parietal laceration. EXAM: CT HEAD WITHOUT CONTRAST CT CERVICAL SPINE WITHOUT CONTRAST TECHNIQUE: Multidetector CT imaging of the head and cervical spine was performed following the standard protocol without intravenous contrast. Multiplanar CT image reconstructions of the cervical spine were also generated. COMPARISON:  Multiple priors most recently 06/28/2017 FINDINGS: CT HEAD FINDINGS Brain: Unchanged degree of atrophy and chronic small vessel ischemia. No intracranial hemorrhage, mass effect, or midline shift. No hydrocephalus. The basilar cisterns are patent. No evidence of territorial infarct or acute ischemia. No extra-axial or intracranial fluid collection. Vascular: Atherosclerosis of skullbase vasculature without hyperdense vessel or abnormal calcification. Skull: No fracture or focal lesion. Sinuses/Orbits: Paranasal sinuses and mastoid air cells are clear. The visualized orbits are unremarkable. Bilateral cataract resection. Other: Right frontoparietal scalp hematoma. CT CERVICAL SPINE FINDINGS Alignment: No traumatic subluxation. Degenerative anterolisthesis of C4 on C5, unchanged from prior. Skull base and vertebrae: Comminuted mildly displaced fracture of the spinous process of C7 does not extend to the lamina or canal cortex. No additional acute fracture. Dens and skull base are intact. Soft tissues and spinal canal: No prevertebral fluid or swelling. No visible canal hematoma. Disc levels: Similar degree of degenerative disc disease and facet arthropathy from prior exam. Disc space narrowing and endplate spurring most prominent at C5-C6 and C6-C7. Upper chest: Large right pleural effusion partially included. Other:  Carotid calcifications. IMPRESSION: 1. No acute intracranial abnormality. No skull fracture. Right frontoparietal scalp hematoma. 2. Acute mildly comminuted and displaced C7 spinous process fracture. No additional fracture or subluxation of the cervical spine. 3. Large right pleural effusion, partially included. These results were called by telephone at the time of interpretation on 09/16/2017 at 2:20 am to Dr. Lurline Hare , who verbally acknowledged these results. Electronically Signed   By: Jeb Levering M.D.   On: 09/16/2017 02:20   Dg Chest Port 1 View  Result Date: 09/16/2017 CLINICAL DATA:  Fall, head laceration. EXAM: PORTABLE CHEST 1 VIEW COMPARISON:  Chest radiograph June 28, 2017 FINDINGS: Large RIGHT pleural effusion, increased from prior examination. No mediastinal shift. Persistent underlying consolidation. The cardiac silhouette is at least mildly enlarged, predominantly obscured. Calcified aortic arch. LEFT cardiac pacemaker. No pneumothorax. Osteopenia. RIGHT clavicle ORIF with nonunion. Interval removal of dialysis catheter. IMPRESSION: Large RIGHT pleural effusion increased from prior examination; underlying consolidation. Cardiomegaly. Aortic Atherosclerosis (ICD10-I70.0). Electronically Signed   By: Elon Alas  M.D.   On: 09/16/2017 01:48       Thank  you for the consultation and for allowing [facility] to assist in the care of your patient. Our recommendations are noted above.  Please contact us if we can be of further service.   Marda Stalker, M.D., F.C.C.P.  Board Certified in Internal Medicine, Pulmonary Medicine, [place], and Sleep Medicine.  [facility] Office Number: [phone]   09/16/2017

## 2017-09-16 NOTE — Progress Notes (Signed)
Pt skin assessed by myself Patrice Paradise, RN) fellow RN Kristine Royal, RN) and NT Henderson Newcomer. Pt has multiple bruises, abrasions and skin tears. Large purple bruise covering upper left leg, left hip, and extending across the lower back. Pt has blanchable redness of hop and buttocks. Boggy heels bilaterally. Multiple skin tears on extremities. Multiple  skin tears right arm below A.V. fistula. Skin tear on back right shoulder. Ecchymosis covering upper extremities with mottling, and cover lower extremities. Coloration of bruises vary in stages of healing with predominate dark purple to black coloration. Foam applied to multiple pressure points, abrasions and skin tears. Wound consult discussed in progressive rounds. Wound consult ordered prior to shift end.

## 2017-09-17 ENCOUNTER — Observation Stay: Payer: Medicare Other

## 2017-09-17 DIAGNOSIS — J90 Pleural effusion, not elsewhere classified: Secondary | ICD-10-CM | POA: Diagnosis not present

## 2017-09-17 LAB — CBC
HCT: 34.8 % — ABNORMAL LOW (ref 35.0–47.0)
Hemoglobin: 11.5 g/dL — ABNORMAL LOW (ref 12.0–16.0)
MCH: 32.3 pg (ref 26.0–34.0)
MCHC: 33.2 g/dL (ref 32.0–36.0)
MCV: 97.2 fL (ref 80.0–100.0)
PLATELETS: 218 10*3/uL (ref 150–440)
RBC: 3.58 MIL/uL — ABNORMAL LOW (ref 3.80–5.20)
RDW: 16.8 % — ABNORMAL HIGH (ref 11.5–14.5)
WBC: 6.1 10*3/uL (ref 3.6–11.0)

## 2017-09-17 LAB — BASIC METABOLIC PANEL
Anion gap: 14 (ref 5–15)
BUN: 62 mg/dL — AB (ref 8–23)
CO2: 27 mmol/L (ref 22–32)
CREATININE: 5.15 mg/dL — AB (ref 0.44–1.00)
Calcium: 8.8 mg/dL — ABNORMAL LOW (ref 8.9–10.3)
Chloride: 99 mmol/L (ref 98–111)
GFR calc Af Amer: 8 mL/min — ABNORMAL LOW (ref >60)
GFR, EST NON AFRICAN AMERICAN: 7 mL/min — AB (ref >60)
Glucose, Bld: 160 mg/dL — ABNORMAL HIGH (ref 70–99)
Potassium: 4.3 mmol/L (ref 3.5–5.1)
SODIUM: 140 mmol/L (ref 135–145)

## 2017-09-17 LAB — TROPONIN I
TROPONIN I: 1.05 ng/mL — AB (ref <0.03)
Troponin I: 0.76 ng/mL (ref <0.03)

## 2017-09-17 LAB — TRIGLYCERIDES, BODY FLUIDS: Triglycerides, Fluid: 20 mg/dL

## 2017-09-17 MED ORDER — BOOST / RESOURCE BREEZE PO LIQD CUSTOM
1.0000 | Freq: Three times a day (TID) | ORAL | Status: DC
  Administered 2017-09-22: 1 via ORAL

## 2017-09-17 MED ORDER — CHLORHEXIDINE GLUCONATE CLOTH 2 % EX PADS
6.0000 | MEDICATED_PAD | Freq: Every day | CUTANEOUS | Status: DC
  Administered 2017-09-18 – 2017-09-22 (×3): 6 via TOPICAL

## 2017-09-17 MED ORDER — VITAMIN C 500 MG PO TABS
250.0000 mg | ORAL_TABLET | Freq: Two times a day (BID) | ORAL | Status: DC
  Administered 2017-09-17 – 2017-09-19 (×4): 250 mg via ORAL
  Filled 2017-09-17 (×10): qty 0.5

## 2017-09-17 MED ORDER — OCUVITE-LUTEIN PO CAPS
1.0000 | ORAL_CAPSULE | Freq: Every day | ORAL | Status: DC
  Filled 2017-09-17 (×6): qty 1

## 2017-09-17 MED ORDER — SODIUM CHLORIDE 0.9 % IV SOLN: INTRAVENOUS | Status: DC

## 2017-09-17 NOTE — Progress Notes (Signed)
Off going nurse relayed that patient troponin had been elevated. Notified Dr Jannifer Franklin earlier for order to repeat. Order was placed to repeat at 2am. The result still critical and had actual went up from 0.46 to 0.76. Paged Dr Marcille Blanco to report the critical result. He returned call and acknowledged the results with no new orders.

## 2017-09-17 NOTE — Progress Notes (Signed)
[facility]  ROUNDING NOTE   Subjective:  Patient well-known to [place]. She presents now again with a fall. Patient under our care for hemodialysis. She is due for dialysis today.     Objective:  Vital signs in last 24 hours:  Temp:  [97.5 F (36.4 C)] 97.5 F (36.4 C) (08/23 0445) Pulse Rate:  [65-87] 65 (08/23 1321) Resp:  [16-21] 20 (08/23 1321) BP: (101-121)/(50-61) 101/50 (08/23 1321) SpO2:  [88 %-99 %] 93 % (08/23 1321) Weight:  [46.3 kg] 46.3 kg (08/23 0422)  Weight change: -4.433 kg Filed Weights   09/16/17 0109 09/17/17 0422  Weight: 50.7 kg 46.3 kg    Intake/Output: No intake/output data recorded.   Intake/Output this shift:  No intake/output data recorded.  Physical Exam: General: NAD   Head: +hard of hearing, Moist oral mucosal membranes  Eyes: Anicteric   Neck: Neck collar one  Lungs:  Clear bilateral, normal effort  Heart: Regular rate and rhythm  Abdomen:  Soft, nontender, BS present  Extremities: no peripheral edema.   Neurologic: Nonfocal, moving all four extremities  Skin: Bilateral UE ecchymoses  Access: Left AVF    Basic Metabolic Panel: Recent Labs  Lab 09/16/17 0118 09/17/17 0924  NA 138 140  K 3.5 4.3  CL 99 99  CO2 27 27  GLUCOSE 88 160*  BUN 43* 62*  CREATININE 3.57* 5.15*  CALCIUM 8.4* 8.8*    Liver Function Tests: Recent Labs  Lab 09/16/17 0118  AST 23  ALT 16  ALKPHOS 86  BILITOT 1.1  PROT 7.2  ALBUMIN 3.5   No results for input(s): LIPASE, AMYLASE in the last 168 hours. No results for input(s): AMMONIA in the last 168 hours.  CBC: Recent Labs  Lab 09/16/17 0118 09/17/17 0924  WBC 5.3 6.1  HGB 11.7* 11.5*  HCT 35.6 34.8*  MCV 96.8 97.2  PLT 197 218    Cardiac Enzymes: Recent Labs  Lab 09/16/17 0528 09/16/17 1115 09/16/17 1651 09/17/17 0157 09/17/17 0924  TROPONINI 0.11* 0.13* 0.46* 0.76* 1.05*    BNP: Invalid input(s): POCBNP  CBG: No results for input(s): GLUCAP in the last  168 hours.  Microbiology: Results for orders placed or performed during the hospital encounter of 09/16/17  Body fluid culture     Status: None (Preliminary result)   Collection Time: 09/16/17  3:25 PM  Result Value Ref Range Status   Specimen Description   Final    PLEURAL Performed at [facility], [address] [place], [place] [address]    Special Requests   Final    NONE Performed at [facility], [address] [place], [place] [address]    Gram Stain   Final    RARE WBC PRESENT, PREDOMINANTLY MONONUCLEAR NO ORGANISMS SEEN    Culture   Final    NO GROWTH < 12 HOURS Performed at [facility], [place] [address], [place], [place] [address]    Report Status PENDING  Incomplete    Coagulation Studies: No results for input(s): LABPROT, INR in the last 72 hours.  Urinalysis: No results for input(s): COLORURINE, LABSPEC, PHURINE, GLUCOSEU, HGBUR, BILIRUBINUR, KETONESUR, PROTEINUR, UROBILINOGEN, NITRITE, LEUKOCYTESUR in the last 72 hours.  Invalid input(s): APPERANCEUR    Imaging: Dg Chest 1 View  Result Date: 09/17/2017 CLINICAL DATA:  Follow-up incomplete re-expansion of the right lung following thoracentesis. EXAM: CHEST  1 VIEW COMPARISON:  09/16/2017 FINDINGS: There remains some minimal lack of re-expansion of the right upper lobe.  The more inferior component has shown some reaccumulation of fluid in the interim. Mild basilar atelectasis is noted on the right as well as within the right upper lobe. The left lung remains clear. Cardiac shadow is enlarged. IMPRESSION: Slight reaccumulation of right pleural fluid inferiorly. There remains some incomplete re-expansion of the lung following thoracentesis. Continued follow-up is recommended and correlation with patient's clinical symptomatology. Atelectatic changes in the right upper and right lower lobes. Electronically Signed   By: Inez Catalina M.D.   On: 09/17/2017 09:39   Dg Chest 1 View  Result  Date: 09/16/2017 CLINICAL DATA:  Status post right thoracentesis. EXAM: CHEST  1 VIEW COMPARISON:  09/16/2017 FINDINGS: Cardiac shadow is enlarged. Pacemaker is again identified. Previously seen large right pleural effusion has resolved following thoracentesis. Small pneumothorax is noted along the apex as well as the right lung base related incomplete re-expansion of the right lung. Left lung remains clear with minimal effusion. No acute bony abnormality is seen. Postsurgical changes of the right clavicle are noted. IMPRESSION: Resolution of previously seen right-sided pleural effusion with incomplete re-expansion of the right lung with changes consistent with small pneumothorax. No chest tube is warranted at this time is the patient is asymptomatic. This will likely re-expand or refill with fluid over time. Follow-up chest x-ray on 09/17/2017 is recommended. Electronically Signed   By: Inez Catalina M.D.   On: 09/16/2017 16:04   Ct Head Wo Contrast  Result Date: 09/16/2017 CLINICAL DATA:  Fall with right parietal laceration. EXAM: CT HEAD WITHOUT CONTRAST CT CERVICAL SPINE WITHOUT CONTRAST TECHNIQUE: Multidetector CT imaging of the head and cervical spine was performed following the standard protocol without intravenous contrast. Multiplanar CT image reconstructions of the cervical spine were also generated. COMPARISON:  Multiple priors most recently 06/28/2017 FINDINGS: CT HEAD FINDINGS Brain: Unchanged degree of atrophy and chronic small vessel ischemia. No intracranial hemorrhage, mass effect, or midline shift. No hydrocephalus. The basilar cisterns are patent. No evidence of territorial infarct or acute ischemia. No extra-axial or intracranial fluid collection. Vascular: Atherosclerosis of skullbase vasculature without hyperdense vessel or abnormal calcification. Skull: No fracture or focal lesion. Sinuses/Orbits: Paranasal sinuses and mastoid air cells are clear. The visualized orbits are unremarkable.  Bilateral cataract resection. Other: Right frontoparietal scalp hematoma. CT CERVICAL SPINE FINDINGS Alignment: No traumatic subluxation. Degenerative anterolisthesis of C4 on C5, unchanged from prior. Skull base and vertebrae: Comminuted mildly displaced fracture of the spinous process of C7 does not extend to the lamina or canal cortex. No additional acute fracture. Dens and skull base are intact. Soft tissues and spinal canal: No prevertebral fluid or swelling. No visible canal hematoma. Disc levels: Similar degree of degenerative disc disease and facet arthropathy from prior exam. Disc space narrowing and endplate spurring most prominent at C5-C6 and C6-C7. Upper chest: Large right pleural effusion partially included. Other: Carotid calcifications. IMPRESSION: 1. No acute intracranial abnormality. No skull fracture. Right frontoparietal scalp hematoma. 2. Acute mildly comminuted and displaced C7 spinous process fracture. No additional fracture or subluxation of the cervical spine. 3. Large right pleural effusion, partially included. These results were called by telephone at the time of interpretation on 09/16/2017 at 2:20 am to Dr. Lurline Hare , who verbally acknowledged these results. Electronically Signed   By: Jeb Levering M.D.   On: 09/16/2017 02:20   Ct Cervical Spine Wo Contrast  Result Date: 09/16/2017 CLINICAL DATA:  Fall with right parietal laceration. EXAM: CT HEAD WITHOUT CONTRAST CT CERVICAL SPINE WITHOUT CONTRAST TECHNIQUE:  Multidetector CT imaging of the head and cervical spine was performed following the standard protocol without intravenous contrast. Multiplanar CT image reconstructions of the cervical spine were also generated. COMPARISON:  Multiple priors most recently 06/28/2017 FINDINGS: CT HEAD FINDINGS Brain: Unchanged degree of atrophy and chronic small vessel ischemia. No intracranial hemorrhage, mass effect, or midline shift. No hydrocephalus. The basilar cisterns are patent. No  evidence of territorial infarct or acute ischemia. No extra-axial or intracranial fluid collection. Vascular: Atherosclerosis of skullbase vasculature without hyperdense vessel or abnormal calcification. Skull: No fracture or focal lesion. Sinuses/Orbits: Paranasal sinuses and mastoid air cells are clear. The visualized orbits are unremarkable. Bilateral cataract resection. Other: Right frontoparietal scalp hematoma. CT CERVICAL SPINE FINDINGS Alignment: No traumatic subluxation. Degenerative anterolisthesis of C4 on C5, unchanged from prior. Skull base and vertebrae: Comminuted mildly displaced fracture of the spinous process of C7 does not extend to the lamina or canal cortex. No additional acute fracture. Dens and skull base are intact. Soft tissues and spinal canal: No prevertebral fluid or swelling. No visible canal hematoma. Disc levels: Similar degree of degenerative disc disease and facet arthropathy from prior exam. Disc space narrowing and endplate spurring most prominent at C5-C6 and C6-C7. Upper chest: Large right pleural effusion partially included. Other: Carotid calcifications. IMPRESSION: 1. No acute intracranial abnormality. No skull fracture. Right frontoparietal scalp hematoma. 2. Acute mildly comminuted and displaced C7 spinous process fracture. No additional fracture or subluxation of the cervical spine. 3. Large right pleural effusion, partially included. These results were called by telephone at the time of interpretation on 09/16/2017 at 2:20 am to Dr. Lurline Hare , who verbally acknowledged these results. Electronically Signed   By: Jeb Levering M.D.   On: 09/16/2017 02:20   Dg Chest Port 1 View  Result Date: 09/16/2017 CLINICAL DATA:  Fall, head laceration. EXAM: PORTABLE CHEST 1 VIEW COMPARISON:  Chest radiograph June 28, 2017 FINDINGS: Large RIGHT pleural effusion, increased from prior examination. No mediastinal shift. Persistent underlying consolidation. The cardiac silhouette is at  least mildly enlarged, predominantly obscured. Calcified aortic arch. LEFT cardiac pacemaker. No pneumothorax. Osteopenia. RIGHT clavicle ORIF with nonunion. Interval removal of dialysis catheter. IMPRESSION: Large RIGHT pleural effusion increased from prior examination; underlying consolidation. Cardiomegaly. Aortic Atherosclerosis (ICD10-I70.0). Electronically Signed   By: Elon Alas M.D.   On: 09/16/2017 01:48   US Thoracentesis Asp Pleural Space W/img Guide  Result Date: 09/16/2017 INDICATION: Large right-sided pleural effusion EXAM: ULTRASOUND GUIDED RIGHT THORACENTESIS MEDICATIONS: None. COMPLICATIONS: None immediate. PROCEDURE: An ultrasound guided thoracentesis was thoroughly discussed with the patient and questions answered. The benefits, risks, alternatives and complications were also discussed. The patient understands and wishes to proceed with the procedure. Written consent was obtained. Ultrasound was performed to localize and mark an adequate pocket of fluid in the right chest. The area was then prepped and draped in the normal sterile fashion. 1% Lidocaine was used for local anesthesia. Under ultrasound guidance a 6 Fr Safe-T-Centesis catheter was introduced. Thoracentesis was performed. Air was noted at the termination of the thoracentesis indicating incomplete re-expansion of the right lung. The catheter was removed and a dressing applied. FINDINGS: A total of approximately 1.9 L of clear yellow fluid was removed. Samples were sent to the laboratory as requested by the clinical team. IMPRESSION: Successful ultrasound guided right thoracentesis yielding 1.9 L of pleural fluid. Air was noted being withdrawn at the end of the thoracentesis likely related incomplete re-expansion of the right lung. This does not represent a  true pneumothorax but merely incomplete re-expansion. Follow-up chest x-ray in 1 day is recommended. Electronically Signed   By: Inez Catalina M.D.   On: 09/16/2017 16:08      Medications:    . cefdinir  300 mg Oral QODAY  . docusate sodium  100 mg Oral BID  . feeding supplement  1 Container Oral TID BM  . feeding supplement (PRO-STAT SUGAR FREE 64)  30 mL Oral BID  . hydrOXYzine  12.5 mg Oral TID  . latanoprost  1 drop Both Eyes QHS  . levothyroxine  25 mcg Oral QAC breakfast  . lidocaine  1 application Topical Q M,W,F-HD  . metoprolol succinate  25 mg Oral Daily  . multivitamin  1 tablet Oral QHS  . multivitamin-lutein  1 capsule Oral Daily  . sevelamer carbonate  800 mg Oral TID WC  . vitamin C  250 mg Oral BID  . [START ON 09/20/2017] Vitamin D (Ergocalciferol)  50,000 Units Oral Q Mon   acetaminophen **OR** acetaminophen, guaiFENesin-dextromethorphan, hydrocortisone cream, LORazepam, ondansetron **OR** ondansetron (ZOFRAN) IV, traMADol  Assessment/ Plan:  [name] is a 84 y.o. white female with end stage renal disease on hemodialysis, coronary artery disease, pacemaker placement, meniere's disease, congestive heart failure, peripheral vascular disease, multiple falls and fractures.   MWF [facility] permcath/left AVF EDW 46.5kg.   1. End-stage renal disease: . Patient due for hemodialysis today.  We will further orders.  Minimal ultrafiltration.  2. Anemia of chronic kidney disease: hemoglobin currently 11.5.  Hold off on Epogen for now.  3. Secondary hyperparathyroidism with hyperphosphatemia:recheck PTHand phosphorus today.     LOS: 0 Rosaleen Mazer 8/23/20195:20 PM

## 2017-09-17 NOTE — Progress Notes (Signed)
[facility] Consultation      Assessment and Plan:  Large right pleural effusion with acute hypoxic respiratory failure. Chronic systolic heart failure End-stage renal disease on hemodialysis. Severe debility with multiple falls. History of DVT status post IVC filter.  --Status post right thoracentesis on 8/22 with removal of 1.9 L.  Elevated LDH, however overall I would suspect that this is a transudate of pleural effusion. --Continued management of chronic heart failure, end-stage renal disease, which may be contributing to the effusion. --Given poor prognosis recommend continued discussions with palliative care.   Date: 09/17/2017  MRN# [id number] Sharon [name] [birth year]  Referring Physician: Dr. Jolaine Artist is a 84 y.o. old female seen in consultation for chief complaint of:    Chief Complaint  Patient presents with  . Fall  . Laceration    HPI:  The patient is an 84 year old female recently discharged from the hospital on 09/07/2017 due to failure to thrive, severe peripheral vascular disease, left leg cellulitis.  Her history is significant for coronary artery disease, MI, status post pacemaker, history of DVT/PE status post IVC filter placement, CHF, end-stage renal disease on dialysis.  She was discharged back to [facility], palliative care conversations during previous admission failed to establish any reasonable goals of care.  Patient presents to the hospital earlier this morning for an unwitnessed fall with scalp laceration, apparently she fell forward from a wheelchair.  Subsequently was noted that the patient had elevated troponin, pleural effusion, C7 fracture, treated with collar. Currently the patient has diminished mental status, patient's RN tells me that she had some agitation earlier and had to be given Ativan.  Currently her oxygen saturation on room air is noted to be 84%, this increases to 83% with 2 L oxygen.  No further  history could be obtained.  Chest x-ray imaging on 09/16/2017 personally reviewed, this shows a large right-sided pleural effusion with right lung atelectasis.  This appears increased in size when compared with 2 previous CT chest on 5/22, and drastically increased from a chest x-ray on 05/25/2017.  Currently the patient's oxygen saturation remains at 92% on room air.  I had seen the patient during her previous acute admission on 06/24/2017, at that time was noted that she had severe OSA with multiple falls and fractures.  She was recommended to undergo split-night study, however the patient's nursing home canceled the study and instead ordered an home sleep study.  Echocardiogram 06/28/2017>> EF equals 25%, PA pressure of 57.  PMHX:   Past Medical History:  Diagnosis Date  . Anemia   . Anxiety   . Arthritis   . CAD (coronary artery disease)   . Cancer ([place])    skin  . Cardiomyopathy ([place])   . CHF (congestive heart failure) ([place])   . Depression   . IBS (irritable bowel syndrome) 2010  . Kidney failure July 2012   Hemodialysis 3xweek  . Kidney failure   . Meniere disease   . Meniere's disease   . Myocardial infarction ([place])   . OSA (obstructive sleep apnea)    CPAP  . Peritoneal dialysis status ([place])   . Presence of IVC filter 2019  . Presence of permanent cardiac pacemaker   . Renal insufficiency    Surgical Hx:  Past Surgical History:  Procedure Laterality Date  . [place]  . ABDOMINAL HYSTERECTOMY    . ANUS SURGERY  2010  . AV FISTULA PLACEMENT Right 04/15/2016  Procedure: ARTERIOVENOUS (AV) FISTULA CREATION ( RADIOCEPHALIC ) STAGE 2;  Surgeon: Algernon Huxley, MD;  Location: [facility];  Service: Vascular;  Laterality: Right;  . CATARACT EXTRACTION  2006, 2011  . CHOLECYSTECTOMY  01/2008  . DIALYSIS/PERMA CATHETER INSERTION N/A 01/07/2017   Procedure: DIALYSIS/PERMA CATHETER INSERTION With an IVC filter placement;  Surgeon: Algernon Huxley, MD;  Location: [place] [facility];  Service: Cardiovascular;  Laterality: N/A;  . DIALYSIS/PERMA CATHETER REMOVAL N/A 08/20/2016   Procedure: Dialysis/Perma Catheter Removal;  Surgeon: Algernon Huxley, MD;  Location: [place] [facility];  Service: Cardiovascular;  Laterality: N/A;  . DIALYSIS/PERMA CATHETER REMOVAL N/A 08/24/2017   Procedure: DIALYSIS/PERMA CATHETER REMOVAL;  Surgeon: Katha Cabal, MD;  Location: [place] [facility];  Service: Cardiovascular;  Laterality: N/A;  . EYE SURGERY     cataracts bilateral  . FEMUR IM NAIL Left 12/15/2015   Procedure: INTRAMEDULLARY (IM) RETROGRADE FEMORAL NAILING;  Surgeon: Oletta Cohn, DO;  Location: [facility];  Service: Orthopedics;  Laterality: Left;  . HIP PINNING,CANNULATED Left 05/10/2016   Procedure: CANNULATED HIP PINNING;  Surgeon: Earnestine Leys, MD;  Location: [facility];  Service: Orthopedics;  Laterality: Left;  . INSERT / REPLACE / REMOVE PACEMAKER    . INSERTION OF DIALYSIS CATHETER  07/2010  . IVC FILTER INSERTION  2019  . JOINT REPLACEMENT     left knee  . MINOR REMOVAL OF PERITONEAL DIALYSIS CATHETER  04/15/2016   Procedure: MINOR REMOVAL OF PERITONEAL DIALYSIS CATHETER;  Surgeon: Algernon Huxley, MD;  Location: [facility];  Service: Vascular;;  . ORIF CLAVICULAR FRACTURE Right 02/16/2017   Procedure: OPEN REDUCTION INTERNAL FIXATION (ORIF) CLAVICULAR FRACTURE;  Surgeon: Corky Mull, MD;  Location: [facility];  Service: Orthopedics;  Laterality: Right;  . PACEMAKER INSERTION  2006  . PERIPHERAL VASCULAR CATHETERIZATION N/A 12/18/2015   Procedure: Dialysis/Perma Catheter Insertion;  Surgeon: Katha Cabal, MD;  Location: [place] [facility];  Service: Cardiovascular;  Laterality: N/A;  . TOTAL KNEE ARTHROPLASTY  2008   LEFT/Dr [place]   Family Hx:  Family History  Problem Relation Age of Onset  . Cancer Mother        ? ovarian - sarcoma  . Cancer Father        Skin cancer  . Diabetes Sister   . COPD Sister   . Depression Sister   . Cancer  Sister        Lung - 66 yrs old   Social Hx:   Social History   Tobacco Use  . Smoking status: Former Smoker    Last attempt to quit: 03/31/1948    Years since quitting: 69.5  . Smokeless tobacco: Never Used  Substance Use Topics  . Alcohol use: No  . Drug use: No   Medication:    Current Facility-Administered Medications:  .  acetaminophen (TYLENOL) tablet 650 mg, 650 mg, Oral, Q6H PRN **OR** acetaminophen (TYLENOL) suppository 650 mg, 650 mg, Rectal, Q6H PRN, Harrie Foreman, MD .  cefdinir (OMNICEF) capsule 300 mg, 300 mg, Oral, Matthias Hughs, MD, 300 mg at 09/17/17 1021 .  docusate sodium (COLACE) capsule 100 mg, 100 mg, Oral, BID, Harrie Foreman, MD, 100 mg at 09/17/17 1021 .  feeding supplement (BOOST / RESOURCE BREEZE) liquid 1 Container, 1 Container, Oral, TID BM, Vaughan Basta, MD .  feeding supplement (PRO-STAT SUGAR FREE 64) liquid 30 mL, 30 mL, Oral, BID, Vaughan Basta, MD .  guaiFENesin-dextromethorphan (ROBITUSSIN DM) 100-10 MG/5ML  syrup 5 mL, 5 mL, Oral, Q4H PRN, Harrie Foreman, MD .  hydrocortisone cream 1 % 1 application, 1 application, Topical, TID PRN, Harrie Foreman, MD .  hydrOXYzine (ATARAX/VISTARIL) tablet 12.5 mg, 12.5 mg, Oral, TID, Harrie Foreman, MD, 12.5 mg at 09/17/17 1021 .  latanoprost (XALATAN) 0.005 % ophthalmic solution 1 drop, 1 drop, Both Eyes, QHS, Harrie Foreman, MD, 1 drop at 09/16/17 2152 .  levothyroxine (SYNTHROID, LEVOTHROID) tablet 25 mcg, 25 mcg, Oral, QAC breakfast, Harrie Foreman, MD, 25 mcg at 09/17/17 1021 .  lidocaine (LMX) 4 % cream 1 application, 1 application, Topical, Q M,W,F-HD, Harrie Foreman, MD .  LORazepam (ATIVAN) injection 0.5 mg, 0.5 mg, Intravenous, Q6H PRN, Vaughan Basta, MD .  metoprolol succinate (TOPROL-XL) 24 hr tablet 25 mg, 25 mg, Oral, Daily, Harrie Foreman, MD .  multivitamin (RENA-VIT) tablet 1 tablet, 1 tablet, Oral, QHS, Harrie Foreman,  MD .  multivitamin-lutein (OCUVITE-LUTEIN) capsule 1 capsule, 1 capsule, Oral, Daily, Vaughan Basta, MD .  ondansetron (ZOFRAN) tablet 4 mg, 4 mg, Oral, Q6H PRN **OR** ondansetron (ZOFRAN) injection 4 mg, 4 mg, Intravenous, Q6H PRN, Harrie Foreman, MD .  sevelamer carbonate (RENVELA) tablet 800 mg, 800 mg, Oral, TID WC, Harrie Foreman, MD .  traMADol Veatrice Bourbon) tablet 50-100 mg, 50-100 mg, Oral, Q6H PRN, Harrie Foreman, MD .  vitamin C (ASCORBIC ACID) tablet 250 mg, 250 mg, Oral, BID, Vaughan Basta, MD, 250 mg at 09/17/17 1021 .  [START ON 09/20/2017] Vitamin D (Ergocalciferol) (DRISDOL) capsule 50,000 Units, 50,000 Units, Oral, Q Mon, Harrie Foreman, MD  Facility-Administered Medications Ordered in Other Encounters:  .  ceFAZolin (ANCEF) IVPB 1 g/50 mL premix, 1 g, Intravenous, Once, Stegmayer, Kimberly A, PA-C   Allergies:  Statins; Codeine sulfate; Codeine; Effexor [venlafaxine]; and Ezetimibe-simvastatin  Review of Systems: Could not give a review of systems due to reduced mental status.  Physical Examination:   VS: BP (!) 101/50 (BP Location: Left Arm)   Pulse 65   Temp (!) 97.5 F (36.4 C)   Resp 20   Ht 5\' 4"  (1.626 m)   Wt 46.3 kg   SpO2 93%   BMI 17.51 kg/m   General Appearance: No distress, awake Neuro:without focal findings HEENT: PERRLA, EOM intact.   Pulmonary: normal breath sounds, good bilateral air entry. CardiovascularNormal S1,S2.  No m/r/g.   Abdomen: Benign, Soft, non-tender. Renal:  No costovertebral tenderness  GU:  No performed at this time. Endoc: No evident thyromegaly, no signs of acromegaly. Skin:   warm, no rashes, no ecchymosis  Extremities: normal, no cyanosis, clubbing.  Other findings:    LABORATORY PANEL:   CBC Recent Labs  Lab 09/17/17 0924  WBC 6.1  HGB 11.5*  HCT 34.8*  PLT 218    ------------------------------------------------------------------------------------------------------------------  Chemistries  Recent Labs  Lab 09/16/17 0118 09/17/17 0924  NA 138 140  K 3.5 4.3  CL 99 99  CO2 27 27  GLUCOSE 88 160*  BUN 43* 62*  CREATININE 3.57* 5.15*  CALCIUM 8.4* 8.8*  AST 23  --   ALT 16  --   ALKPHOS 86  --   BILITOT 1.1  --    ------------------------------------------------------------------------------------------------------------------  Cardiac Enzymes Recent Labs  Lab 09/17/17 0924  TROPONINI 1.05*   ------------------------------------------------------------  RADIOLOGY:  Dg Chest 1 View  Result Date: 09/17/2017 CLINICAL DATA:  Follow-up incomplete re-expansion of the right lung following thoracentesis. EXAM: CHEST  1 VIEW COMPARISON:  09/16/2017  FINDINGS: There remains some minimal lack of re-expansion of the right upper lobe. The more inferior component has shown some reaccumulation of fluid in the interim. Mild basilar atelectasis is noted on the right as well as within the right upper lobe. The left lung remains clear. Cardiac shadow is enlarged. IMPRESSION: Slight reaccumulation of right pleural fluid inferiorly. There remains some incomplete re-expansion of the lung following thoracentesis. Continued follow-up is recommended and correlation with patient's clinical symptomatology. Atelectatic changes in the right upper and right lower lobes. Electronically Signed   By: Inez Catalina M.D.   On: 09/17/2017 09:39   Dg Chest 1 View  Result Date: 09/16/2017 CLINICAL DATA:  Status post right thoracentesis. EXAM: CHEST  1 VIEW COMPARISON:  09/16/2017 FINDINGS: Cardiac shadow is enlarged. Pacemaker is again identified. Previously seen large right pleural effusion has resolved following thoracentesis. Small pneumothorax is noted along the apex as well as the right lung base related incomplete re-expansion of the right lung. Left lung remains clear  with minimal effusion. No acute bony abnormality is seen. Postsurgical changes of the right clavicle are noted. IMPRESSION: Resolution of previously seen right-sided pleural effusion with incomplete re-expansion of the right lung with changes consistent with small pneumothorax. No chest tube is warranted at this time is the patient is asymptomatic. This will likely re-expand or refill with fluid over time. Follow-up chest x-ray on 09/17/2017 is recommended. Electronically Signed   By: Inez Catalina M.D.   On: 09/16/2017 16:04   Ct Head Wo Contrast  Result Date: 09/16/2017 CLINICAL DATA:  Fall with right parietal laceration. EXAM: CT HEAD WITHOUT CONTRAST CT CERVICAL SPINE WITHOUT CONTRAST TECHNIQUE: Multidetector CT imaging of the head and cervical spine was performed following the standard protocol without intravenous contrast. Multiplanar CT image reconstructions of the cervical spine were also generated. COMPARISON:  Multiple priors most recently 06/28/2017 FINDINGS: CT HEAD FINDINGS Brain: Unchanged degree of atrophy and chronic small vessel ischemia. No intracranial hemorrhage, mass effect, or midline shift. No hydrocephalus. The basilar cisterns are patent. No evidence of territorial infarct or acute ischemia. No extra-axial or intracranial fluid collection. Vascular: Atherosclerosis of skullbase vasculature without hyperdense vessel or abnormal calcification. Skull: No fracture or focal lesion. Sinuses/Orbits: Paranasal sinuses and mastoid air cells are clear. The visualized orbits are unremarkable. Bilateral cataract resection. Other: Right frontoparietal scalp hematoma. CT CERVICAL SPINE FINDINGS Alignment: No traumatic subluxation. Degenerative anterolisthesis of C4 on C5, unchanged from prior. Skull base and vertebrae: Comminuted mildly displaced fracture of the spinous process of C7 does not extend to the lamina or canal cortex. No additional acute fracture. Dens and skull base are intact. Soft  tissues and spinal canal: No prevertebral fluid or swelling. No visible canal hematoma. Disc levels: Similar degree of degenerative disc disease and facet arthropathy from prior exam. Disc space narrowing and endplate spurring most prominent at C5-C6 and C6-C7. Upper chest: Large right pleural effusion partially included. Other: Carotid calcifications. IMPRESSION: 1. No acute intracranial abnormality. No skull fracture. Right frontoparietal scalp hematoma. 2. Acute mildly comminuted and displaced C7 spinous process fracture. No additional fracture or subluxation of the cervical spine. 3. Large right pleural effusion, partially included. These results were called by telephone at the time of interpretation on 09/16/2017 at 2:20 am to Dr. Lurline Hare , who verbally acknowledged these results. Electronically Signed   By: Jeb Levering M.D.   On: 09/16/2017 02:20   Ct Cervical Spine Wo Contrast  Result Date: 09/16/2017 CLINICAL DATA:  Fall with right  parietal laceration. EXAM: CT HEAD WITHOUT CONTRAST CT CERVICAL SPINE WITHOUT CONTRAST TECHNIQUE: Multidetector CT imaging of the head and cervical spine was performed following the standard protocol without intravenous contrast. Multiplanar CT image reconstructions of the cervical spine were also generated. COMPARISON:  Multiple priors most recently 06/28/2017 FINDINGS: CT HEAD FINDINGS Brain: Unchanged degree of atrophy and chronic small vessel ischemia. No intracranial hemorrhage, mass effect, or midline shift. No hydrocephalus. The basilar cisterns are patent. No evidence of territorial infarct or acute ischemia. No extra-axial or intracranial fluid collection. Vascular: Atherosclerosis of skullbase vasculature without hyperdense vessel or abnormal calcification. Skull: No fracture or focal lesion. Sinuses/Orbits: Paranasal sinuses and mastoid air cells are clear. The visualized orbits are unremarkable. Bilateral cataract resection. Other: Right frontoparietal scalp  hematoma. CT CERVICAL SPINE FINDINGS Alignment: No traumatic subluxation. Degenerative anterolisthesis of C4 on C5, unchanged from prior. Skull base and vertebrae: Comminuted mildly displaced fracture of the spinous process of C7 does not extend to the lamina or canal cortex. No additional acute fracture. Dens and skull base are intact. Soft tissues and spinal canal: No prevertebral fluid or swelling. No visible canal hematoma. Disc levels: Similar degree of degenerative disc disease and facet arthropathy from prior exam. Disc space narrowing and endplate spurring most prominent at C5-C6 and C6-C7. Upper chest: Large right pleural effusion partially included. Other: Carotid calcifications. IMPRESSION: 1. No acute intracranial abnormality. No skull fracture. Right frontoparietal scalp hematoma. 2. Acute mildly comminuted and displaced C7 spinous process fracture. No additional fracture or subluxation of the cervical spine. 3. Large right pleural effusion, partially included. These results were called by telephone at the time of interpretation on 09/16/2017 at 2:20 am to Dr. Lurline Hare , who verbally acknowledged these results. Electronically Signed   By: Jeb Levering M.D.   On: 09/16/2017 02:20   Dg Chest Port 1 View  Result Date: 09/16/2017 CLINICAL DATA:  Fall, head laceration. EXAM: PORTABLE CHEST 1 VIEW COMPARISON:  Chest radiograph June 28, 2017 FINDINGS: Large RIGHT pleural effusion, increased from prior examination. No mediastinal shift. Persistent underlying consolidation. The cardiac silhouette is at least mildly enlarged, predominantly obscured. Calcified aortic arch. LEFT cardiac pacemaker. No pneumothorax. Osteopenia. RIGHT clavicle ORIF with nonunion. Interval removal of dialysis catheter. IMPRESSION: Large RIGHT pleural effusion increased from prior examination; underlying consolidation. Cardiomegaly. Aortic Atherosclerosis (ICD10-I70.0). Electronically Signed   By: Elon Alas M.D.   On:  09/16/2017 01:48   US Thoracentesis Asp Pleural Space W/img Guide  Result Date: 09/16/2017 INDICATION: Large right-sided pleural effusion EXAM: ULTRASOUND GUIDED RIGHT THORACENTESIS MEDICATIONS: None. COMPLICATIONS: None immediate. PROCEDURE: An ultrasound guided thoracentesis was thoroughly discussed with the patient and questions answered. The benefits, risks, alternatives and complications were also discussed. The patient understands and wishes to proceed with the procedure. Written consent was obtained. Ultrasound was performed to localize and mark an adequate pocket of fluid in the right chest. The area was then prepped and draped in the normal sterile fashion. 1% Lidocaine was used for local anesthesia. Under ultrasound guidance a 6 Fr Safe-T-Centesis catheter was introduced. Thoracentesis was performed. Air was noted at the termination of the thoracentesis indicating incomplete re-expansion of the right lung. The catheter was removed and a dressing applied. FINDINGS: A total of approximately 1.9 L of clear yellow fluid was removed. Samples were sent to the laboratory as requested by the clinical team. IMPRESSION: Successful ultrasound guided right thoracentesis yielding 1.9 L of pleural fluid. Air was noted being withdrawn at the end of the thoracentesis  likely related incomplete re-expansion of the right lung. This does not represent a true pneumothorax but merely incomplete re-expansion. Follow-up chest x-ray in 1 day is recommended. Electronically Signed   By: Inez Catalina M.D.   On: 09/16/2017 16:08       Thank  you for the consultation and for allowing [facility] to assist in the care of your patient. Our recommendations are noted above.  Please contact us if we can be of further service.   Marda Stalker, M.D., F.C.C.P.  Board Certified in Internal Medicine, Pulmonary Medicine, [place], and Sleep Medicine.  [facility] Office Number: [phone]   09/17/2017

## 2017-09-17 NOTE — Progress Notes (Signed)
OT Cancellation Note  Patient Details Name: RAENELL MENSING MRN: [id number] DOB: 06-08-1926   Cancelled Treatment:    Reason Eval/Treat Not Completed: Medical issues which prohibited therapy(Troponin level is progressively trending up. WIll continue to monitor, and perform initial OT eval when appropriate. )  Harrel Carina, MS, OTR/L 09/17/2017, 8:59 AM

## 2017-09-17 NOTE — Evaluation (Signed)
Physical Therapy Evaluation Patient Details [name] MRN: [id number] DOB: [birth year] Today's Date: 09/17/2017   History of Present Illness  84y/o female history of CAD and MI status post pacemaker placement, DVT/PE status post IVC filter placement, CHF and end-stage renal disease on dialysis presents to the emergency department from the nursing home after a fall.  She was found to have C7 fx   Clinical Impression  Pt did well with in-room mobility/ambulation and overall showed good effort despite some impulsivity and needing regular cuing to stay safe and on task.  ~10 minutes of gait training apart from the eval, with good overall effort.  It was difficult to get consistent HR/O2 readings, but she was not overly fatigued with the effort.  Pt is weaker than her baseline and will require further PT once she goes back to her nursing facility.     Follow Up Recommendations Home health PT(at [facility])    Equipment Recommendations  None recommended by PT    Recommendations for Other Services       Precautions / Restrictions Precautions Precautions: Fall Restrictions Weight Bearing Restrictions: No      Mobility  Bed Mobility               General bed mobility comments: Pt in recliner on arrival, apparently she had gotten up by herself during this stay  Transfers Overall transfer level: Modified independent Equipment used: Rolling walker (2 wheeled)             General transfer comment: Pt was able to rise and then return to sitting with relatively safe and appropriate strategy  Ambulation/Gait Ambulation/Gait assistance: Supervision Gait Distance (Feet): 50 Feet Assistive device: Rolling walker (2 wheeled)       General Gait Details: Pt able to ambulate with walker with good safety and confidence.  On 2 liters the entire time and though getting pulseox to read consistently was difficult she was not overly fatigued or short of breath.  Pt with no  LOBs but needed consistent cuing to insure safety and stay on task  Stairs            Wheelchair Mobility    Modified Rankin (Stroke Patients Only)       Balance Overall balance assessment: Modified Independent                                           Pertinent Vitals/Pain Pain Assessment: (unrated, b/l LE, R shoulder and back of head no neck pain)    Home Living Family/patient expects to be discharged to:: Skilled nursing facility                 Additional Comments: Resident of [facility]    Prior Function Level of Independence: Independent with assistive device(s)         Comments: Pt able to do a good bit of walking with walker around the facility     Hand Dominance        Extremity/Trunk Assessment   Upper Extremity Assessment Upper Extremity Assessment: Generalized weakness    Lower Extremity Assessment Lower Extremity Assessment: Generalized weakness       Communication   Communication: HOH  Cognition Arousal/Alertness: Awake/alert Behavior During Therapy: Impulsive Overall Cognitive Status: Within Functional Limits for tasks assessed  General Comments      Exercises     Assessment/Plan    PT Assessment Patient needs continued PT services  PT Problem List Decreased strength;Decreased range of motion;Decreased activity tolerance;Decreased balance;Decreased mobility;Decreased coordination;Decreased cognition;Decreased knowledge of use of DME;Decreased safety awareness       PT Treatment Interventions Gait training;DME instruction;Functional mobility training;Therapeutic activities;Therapeutic exercise;Balance training;Neuromuscular re-education;Patient/family education    PT Goals (Current goals can be found in the Care Plan section)  Acute Rehab PT Goals Patient Stated Goal: go home PT Goal Formulation: With patient Time For Goal Achievement:  10/01/17 Potential to Achieve Goals: Good    Frequency Min 2X/week   Barriers to discharge        Co-evaluation               AM-PAC PT "6 Clicks" Daily Activity  Outcome Measure Difficulty turning over in bed (including adjusting bedclothes, sheets and blankets)?: None Difficulty moving from lying on back to sitting on the side of the bed? : A Little Difficulty sitting down on and standing up from a chair with arms (e.g., wheelchair, bedside commode, etc,.)?: None Help needed moving to and from a bed to chair (including a wheelchair)?: None Help needed walking in hospital room?: A Little Help needed climbing 3-5 steps with a railing? : A Little 6 Click Score: 21    End of Session Equipment Utilized During Treatment: Gait belt Activity Tolerance: Patient tolerated treatment well Patient left: with chair alarm set;with call bell/phone within reach;with family/visitor present Nurse Communication: Mobility status PT Visit Diagnosis: Muscle weakness (generalized) (M62.81);Difficulty in walking, not elsewhere classified (R26.2)    Time: 8182-9937 PT Time Calculation (min) (ACUTE ONLY): 33 min   Charges:   PT Evaluation $PT Eval Low Complexity: 1 Low PT Treatments $Gait Training: 8-22 mins        Kreg Shropshire, DPT 09/17/2017, 1:57 PM

## 2017-09-17 NOTE — Progress Notes (Signed)
[facility] [place] [name]    MR#:  [id number]  DATE OF BIRTH:  10/22/1926  SUBJECTIVE:  CHIEF COMPLAINT:   Chief Complaint  Patient presents with  . Fall  . Laceration   Sent after a fall.  Noted to have large right-sided pleural effusion and cervical vertebral fracture.  Patient is not very cooperative. REVIEW OF SYSTEMS:   Patient is confused in the seated injection Ativan earlier and not able to give review of systems  ROS  DRUG ALLERGIES:   Allergies  Allergen Reactions  . Statins Other (See Comments)    Muscle weakness severe  . Codeine Sulfate Nausea And Vomiting  . Codeine Other (See Comments)    GI UPSET  . Effexor [Venlafaxine] Nausea Only  . Ezetimibe-Simvastatin Other (See Comments)    Muscle weakness    VITALS:  Blood pressure (!) 101/50, pulse 65, temperature (!) 97.5 F (36.4 C), resp. rate 20, height 5\' 4"  (1.626 m), weight 46.3 kg, SpO2 93 %.  PHYSICAL EXAMINATION:  GENERAL:  84 y.o.-year-old malnourished patient lying in the bed with no acute distress.  EYES: Pupils equal, round, reactive to light and accommodation. No scleral icterus. Extraocular muscles intact.  HEENT: Head atraumatic, normocephalic. Oropharynx and nasopharynx clear.  NECK:  Supple, no jugular venous distention. No thyroid enlargement, no tenderness.  LUNGS: Normal breath sounds bilaterally, no wheezing, rales,rhonchi or crepitation. No use of accessory muscles of respiration.  CARDIOVASCULAR: S1, S2 normal. No murmurs, rubs, or gallops.  ABDOMEN: Soft, nontender, nondistended. Bowel sounds present. No organomegaly or mass.  EXTREMITIES: No pedal edema, cyanosis, or clubbing.  NEUROLOGIC: drowsy after ativan injection, moves all limbs to stimuli. PSYCHIATRIC: The patient is drowsy.  SKIN: No obvious rash, lesion, or ulcer.   Physical Exam LABORATORY PANEL:   CBC Recent Labs  Lab 09/17/17 0924  WBC 6.1  HGB 11.5*   HCT 34.8*  PLT 218   ------------------------------------------------------------------------------------------------------------------  Chemistries  Recent Labs  Lab 09/16/17 0118 09/17/17 0924  NA 138 140  K 3.5 4.3  CL 99 99  CO2 27 27  GLUCOSE 88 160*  BUN 43* 62*  CREATININE 3.57* 5.15*  CALCIUM 8.4* 8.8*  AST 23  --   ALT 16  --   ALKPHOS 86  --   BILITOT 1.1  --    ------------------------------------------------------------------------------------------------------------------  Cardiac Enzymes Recent Labs  Lab 09/17/17 0157 09/17/17 0924  TROPONINI 0.76* 1.05*   ------------------------------------------------------------------------------------------------------------------  RADIOLOGY:  Dg Chest 1 View  Result Date: 09/17/2017 CLINICAL DATA:  Follow-up incomplete re-expansion of the right lung following thoracentesis. EXAM: CHEST  1 VIEW COMPARISON:  09/16/2017 FINDINGS: There remains some minimal lack of re-expansion of the right upper lobe. The more inferior component has shown some reaccumulation of fluid in the interim. Mild basilar atelectasis is noted on the right as well as within the right upper lobe. The left lung remains clear. Cardiac shadow is enlarged. IMPRESSION: Slight reaccumulation of right pleural fluid inferiorly. There remains some incomplete re-expansion of the lung following thoracentesis. Continued follow-up is recommended and correlation with patient's clinical symptomatology. Atelectatic changes in the right upper and right lower lobes. Electronically Signed   By: Inez Catalina M.D.   On: 09/17/2017 09:39   Dg Chest 1 View  Result Date: 09/16/2017 CLINICAL DATA:  Status post right thoracentesis. EXAM: CHEST  1 VIEW COMPARISON:  09/16/2017 FINDINGS: Cardiac shadow is enlarged. Pacemaker is again identified. Previously seen large right pleural  effusion has resolved following thoracentesis. Small pneumothorax is noted along the apex as well as  the right lung base related incomplete re-expansion of the right lung. Left lung remains clear with minimal effusion. No acute bony abnormality is seen. Postsurgical changes of the right clavicle are noted. IMPRESSION: Resolution of previously seen right-sided pleural effusion with incomplete re-expansion of the right lung with changes consistent with small pneumothorax. No chest tube is warranted at this time is the patient is asymptomatic. This will likely re-expand or refill with fluid over time. Follow-up chest x-ray on 09/17/2017 is recommended. Electronically Signed   By: Inez Catalina M.D.   On: 09/16/2017 16:04   Ct Head Wo Contrast  Result Date: 09/16/2017 CLINICAL DATA:  Fall with right parietal laceration. EXAM: CT HEAD WITHOUT CONTRAST CT CERVICAL SPINE WITHOUT CONTRAST TECHNIQUE: Multidetector CT imaging of the head and cervical spine was performed following the standard protocol without intravenous contrast. Multiplanar CT image reconstructions of the cervical spine were also generated. COMPARISON:  Multiple priors most recently 06/28/2017 FINDINGS: CT HEAD FINDINGS Brain: Unchanged degree of atrophy and chronic small vessel ischemia. No intracranial hemorrhage, mass effect, or midline shift. No hydrocephalus. The basilar cisterns are patent. No evidence of territorial infarct or acute ischemia. No extra-axial or intracranial fluid collection. Vascular: Atherosclerosis of skullbase vasculature without hyperdense vessel or abnormal calcification. Skull: No fracture or focal lesion. Sinuses/Orbits: Paranasal sinuses and mastoid air cells are clear. The visualized orbits are unremarkable. Bilateral cataract resection. Other: Right frontoparietal scalp hematoma. CT CERVICAL SPINE FINDINGS Alignment: No traumatic subluxation. Degenerative anterolisthesis of C4 on C5, unchanged from prior. Skull base and vertebrae: Comminuted mildly displaced fracture of the spinous process of C7 does not extend to the  lamina or canal cortex. No additional acute fracture. Dens and skull base are intact. Soft tissues and spinal canal: No prevertebral fluid or swelling. No visible canal hematoma. Disc levels: Similar degree of degenerative disc disease and facet arthropathy from prior exam. Disc space narrowing and endplate spurring most prominent at C5-C6 and C6-C7. Upper chest: Large right pleural effusion partially included. Other: Carotid calcifications. IMPRESSION: 1. No acute intracranial abnormality. No skull fracture. Right frontoparietal scalp hematoma. 2. Acute mildly comminuted and displaced C7 spinous process fracture. No additional fracture or subluxation of the cervical spine. 3. Large right pleural effusion, partially included. These results were called by telephone at the time of interpretation on 09/16/2017 at 2:20 am to Dr. Lurline Hare , who verbally acknowledged these results. Electronically Signed   By: Jeb Levering M.D.   On: 09/16/2017 02:20   Ct Cervical Spine Wo Contrast  Result Date: 09/16/2017 CLINICAL DATA:  Fall with right parietal laceration. EXAM: CT HEAD WITHOUT CONTRAST CT CERVICAL SPINE WITHOUT CONTRAST TECHNIQUE: Multidetector CT imaging of the head and cervical spine was performed following the standard protocol without intravenous contrast. Multiplanar CT image reconstructions of the cervical spine were also generated. COMPARISON:  Multiple priors most recently 06/28/2017 FINDINGS: CT HEAD FINDINGS Brain: Unchanged degree of atrophy and chronic small vessel ischemia. No intracranial hemorrhage, mass effect, or midline shift. No hydrocephalus. The basilar cisterns are patent. No evidence of territorial infarct or acute ischemia. No extra-axial or intracranial fluid collection. Vascular: Atherosclerosis of skullbase vasculature without hyperdense vessel or abnormal calcification. Skull: No fracture or focal lesion. Sinuses/Orbits: Paranasal sinuses and mastoid air cells are clear. The  visualized orbits are unremarkable. Bilateral cataract resection. Other: Right frontoparietal scalp hematoma. CT CERVICAL SPINE FINDINGS Alignment: No traumatic subluxation. Degenerative anterolisthesis  of C4 on C5, unchanged from prior. Skull base and vertebrae: Comminuted mildly displaced fracture of the spinous process of C7 does not extend to the lamina or canal cortex. No additional acute fracture. Dens and skull base are intact. Soft tissues and spinal canal: No prevertebral fluid or swelling. No visible canal hematoma. Disc levels: Similar degree of degenerative disc disease and facet arthropathy from prior exam. Disc space narrowing and endplate spurring most prominent at C5-C6 and C6-C7. Upper chest: Large right pleural effusion partially included. Other: Carotid calcifications. IMPRESSION: 1. No acute intracranial abnormality. No skull fracture. Right frontoparietal scalp hematoma. 2. Acute mildly comminuted and displaced C7 spinous process fracture. No additional fracture or subluxation of the cervical spine. 3. Large right pleural effusion, partially included. These results were called by telephone at the time of interpretation on 09/16/2017 at 2:20 am to Dr. Lurline Hare , who verbally acknowledged these results. Electronically Signed   By: Jeb Levering M.D.   On: 09/16/2017 02:20   Dg Chest Port 1 View  Result Date: 09/16/2017 CLINICAL DATA:  Fall, head laceration. EXAM: PORTABLE CHEST 1 VIEW COMPARISON:  Chest radiograph June 28, 2017 FINDINGS: Large RIGHT pleural effusion, increased from prior examination. No mediastinal shift. Persistent underlying consolidation. The cardiac silhouette is at least mildly enlarged, predominantly obscured. Calcified aortic arch. LEFT cardiac pacemaker. No pneumothorax. Osteopenia. RIGHT clavicle ORIF with nonunion. Interval removal of dialysis catheter. IMPRESSION: Large RIGHT pleural effusion increased from prior examination; underlying consolidation.  Cardiomegaly. Aortic Atherosclerosis (ICD10-I70.0). Electronically Signed   By: Elon Alas M.D.   On: 09/16/2017 01:48   US Thoracentesis Asp Pleural Space W/img Guide  Result Date: 09/16/2017 INDICATION: Large right-sided pleural effusion EXAM: ULTRASOUND GUIDED RIGHT THORACENTESIS MEDICATIONS: None. COMPLICATIONS: None immediate. PROCEDURE: An ultrasound guided thoracentesis was thoroughly discussed with the patient and questions answered. The benefits, risks, alternatives and complications were also discussed. The patient understands and wishes to proceed with the procedure. Written consent was obtained. Ultrasound was performed to localize and mark an adequate pocket of fluid in the right chest. The area was then prepped and draped in the normal sterile fashion. 1% Lidocaine was used for local anesthesia. Under ultrasound guidance a 6 Fr Safe-T-Centesis catheter was introduced. Thoracentesis was performed. Air was noted at the termination of the thoracentesis indicating incomplete re-expansion of the right lung. The catheter was removed and a dressing applied. FINDINGS: A total of approximately 1.9 L of clear yellow fluid was removed. Samples were sent to the laboratory as requested by the clinical team. IMPRESSION: Successful ultrasound guided right thoracentesis yielding 1.9 L of pleural fluid. Air was noted being withdrawn at the end of the thoracentesis likely related incomplete re-expansion of the right lung. This does not represent a true pneumothorax but merely incomplete re-expansion. Follow-up chest x-ray in 1 day is recommended. Electronically Signed   By: Inez Catalina M.D.   On: 09/16/2017 16:08    ASSESSMENT AND PLAN:   Active Problems:   Fall  1.  Elevated troponin: Rule out myocardial ischemia.   EKG shows paced rhythm.   patient denies chest pain.  However she does have shortness of breath likely secondary to her pleural effusion.  Etiology of elevated troponin includes poor  renal clearance, demand ischemia    Continue to monitor telemetry.  Follow cardiac biomarkers.  Cardiology consult . 2.  Pleural effusion:   thoracentesis  Differential diagnosis of etiology includes end-stage renal disease versus CHF.  Consult pulmonology. 3.  Fall: Fall precautions;  PT/OT eval prior to discharge   4.  ESRD: On hemodialysis Monday Wednesday Friday.  Consult nephrology for continuation of dialysis if needed. 5.  Cervical fracture: C7; emergency department physician discussed with neurosurgery placement of hard collar if tolerated or soft collar if necessary.  He said to get cervical spine x-ray with flexion and extension but patient is not cooperative. Fracture is stable.  She has no neurologic deficits. 6.  DVT prophylaxis: SCDs 7.  GI prophylaxis: None 8.  Recent cellulitis on legs     Currently on Omnicef, since discharged last week, continue.   All the records are reviewed and case discussed with Care Management/Social Workerr. Management plans discussed with the patient, family and they are in agreement.  CODE STATUS: DNR  TOTAL TIME TAKING CARE OF THIS PATIENT: 35 minutes.     POSSIBLE D/C IN 1-2 DAYS, DEPENDING ON CLINICAL CONDITION.   Vaughan Basta M.D on 09/17/2017   Between 7am to 6pm - Pager - [phone]  After 6pm go to [email or web address] - password [email or web address] [facility]  Office  [phone]  CC: Primary care physician; Kirk Ruths, MD  Note: This dictation was prepared with Dragon dictation along with smaller phrase technology. Any transcriptional errors that result from this process are unintentional.

## 2017-09-17 NOTE — Consult Note (Signed)
[facility] Nurse wound consult note Reason for Consult: multiple skin tears See bedside nurse description of patient's skin. She is covered in bruising and skin tears are mainly noted on the legs. She has a large bruise on the right hip and thigh. She is confused and tries to get out of bed. She is in a low air loss mattress and a low bed with floor mats and a bed alarm in place.  No family in the room.  Wound type: Partial thickness skin tear right pretibial area x 2 Vunerable heels, blanchable but boggy.  Patient refuses to wear Prevalon boots removes and kicks them off. Skin tear right wrist Pressure Injury POA: NA Measurement: two areas noted open on the right lower ext: each aprox. 2cm x 1.5cm and 3cm x 1.5cm x 0.1cm Wound WIO:XBDZH, pink, some fibrin but no acute indication of infection Drainage (amount, consistency, odor) minimal, no odor Periwound: see above  Dressing procedure/placement/frequency: Silicone foam to affected areas. She got very restless with my assessment.  I have discussed transition of the current dressings on the right LE to foam when the nurse or tech deems appropriate when patient is more aware and less agitated with the intervention.   Discussed POC with bedside nurse.  Re consult if needed, will not follow at this time. Thanks  Ardis Fullwood [employer], RN,CWOCN, CNS, [place] [phone])

## 2017-09-17 NOTE — Plan of Care (Signed)
Pt alert and oriented to person, place, time this am. Earlier pt was not as oriented but now she is more awake. Pt placed on low air loss mattress. She ate part of a sandwich, bowl of raisin bran and some graham crackers. No urinary output (pt oliguric, on hemodialysis). UA pending. Placed pressure reducing boots but patient will not keep them on. Troponin's critical-called to MD with no new orders. Pt has been pleasant to staff and cooperative. No complaints of pain. Some complaints of itching, pt said it was due to her kidney failure. HCPOA given update from off going Therapist, sports.  Problem: Education: Goal: Knowledge of General Education information will improve Description Including pain rating scale, medication(s)/side effects and non-pharmacologic comfort measures Outcome: Progressing   Problem: Health Behavior/Discharge Planning: Goal: Ability to manage health-related needs will improve Outcome: Progressing   Problem: Clinical Measurements: Goal: Ability to maintain clinical measurements within normal limits will improve Outcome: Progressing Goal: Will remain free from infection Outcome: Progressing Goal: Diagnostic test results will improve Outcome: Not Progressing Goal: Respiratory complications will improve Outcome: Progressing Goal: Cardiovascular complication will be avoided Outcome: Progressing   Problem: Activity: Goal: Risk for activity intolerance will decrease Outcome: Progressing   Problem: Nutrition: Goal: Adequate nutrition will be maintained Outcome: Not Progressing   Problem: Coping: Goal: Level of anxiety will decrease Outcome: Progressing   Problem: Elimination: Goal: Will not experience complications related to bowel motility Outcome: Progressing Goal: Will not experience complications related to urinary retention Outcome: Not Applicable   Problem: Pain Managment: Goal: General experience of comfort will improve Outcome: Progressing   Problem:  Safety: Goal: Ability to remain free from injury will improve Outcome: Progressing   Problem: Skin Integrity: Goal: Risk for impaired skin integrity will decrease Outcome: Not Progressing

## 2017-09-17 NOTE — Progress Notes (Signed)
Pt awakened again. Tugging at cervical collar and pulling at her hospital gown. Spoke with her and she was unaware of time. She was requesting bacon and eggs.  Since she was awake staff placed her on the speciality mattress/low bed that had arrived from [place]. Floor mats in place, low bed and bed alarm activated.  Pt has multiple skin tears with boggy heels. She not only has been tugging at the cervical collar but removes the foam dressings from her skin tears and will not keep her oxygen nasal cannula in place.  Cervical collar removed per pt request and MD order since she is not currently having any jerking movements.  Will continue to monitor.

## 2017-09-17 NOTE — Progress Notes (Signed)
Pt awakened. She was very sleepy/lethargic. She had received Ativan on previous shift. She requested to get up to the bsc. Pt assisted x 2 to bsc. Had large bm. Assisted back to bed. She requested something to eat. Given [place] sandwich. Checked on her a little while later and she had fallen asleep.

## 2017-09-17 NOTE — Consult Note (Signed)
Cardiology Consultation Note    Patient ID: Sharon [name], MRN: [id number], DOB/AGE: 1926-09-05 84 y.o. Admit date: 09/16/2017   Date of Consult: 09/17/2017 Primary Physician: Kirk Ruths, MD Primary Cardiologist: Dr. Nehemiah Massed  Chief Complaint: fall Reason for Consultation: elevated troponin Requesting MD: Dr. Anselm Jungling  HPI: Sharon [name] is a 84 y.o. female with history of cad, hx of high grade heart block with ppm in place, history of dvt/pe with ivc filter, history of esrd on hd who was admitted after presenting to the er after a fall in her snf. She is a poor historian and is not aware of the circumstances regarding the fall.  She had some head rauma and required lac repair in the er. Head ct was negative for acute intrcranial process. CXR revealed right pleural effusion . Protocol driven troponin was drawn and was noted to be somewhat elevated at 0.76 with initial at 0.11.  Creatinine was 3.57 which is near her baseline.  EKG showed atrial sensed and ventricular paced.  Patient is a very difficult historian.  Events regarding the fall are unclear.  She is scheduled for hemodialysis today.  She has multiple ecchymoses.  She underwent thoracentesis of her right pleural effusion yesterday with evidence of minimal lack of reexpansion of the right upper lobe with some inferior reaccumulation of the fluid.  Approximately 1.9 L were removed.  Laboratories are pending.  Past Medical History:  Diagnosis Date  . Anemia   . Anxiety   . Arthritis   . CAD (coronary artery disease)   . Cancer ([place])    skin  . Cardiomyopathy ([place])   . CHF (congestive heart failure) ([place])   . Depression   . IBS (irritable bowel syndrome) 2010  . Kidney failure July 2012   Hemodialysis 3xweek  . Kidney failure   . Meniere disease   . Meniere's disease   . Myocardial infarction ([place])   . OSA (obstructive sleep apnea)    CPAP  . Peritoneal dialysis status ([place])   . Presence of IVC filter 2019  .  Presence of permanent cardiac pacemaker   . Renal insufficiency       Surgical History:  Past Surgical History:  Procedure Laterality Date  . [place]  . ABDOMINAL HYSTERECTOMY    . ANUS SURGERY  2010  . AV FISTULA PLACEMENT Right 04/15/2016   Procedure: ARTERIOVENOUS (AV) FISTULA CREATION ( RADIOCEPHALIC ) STAGE 2;  Surgeon: Algernon Huxley, MD;  Location: [facility];  Service: Vascular;  Laterality: Right;  . CATARACT EXTRACTION  2006, 2011  . CHOLECYSTECTOMY  01/2008  . DIALYSIS/PERMA CATHETER INSERTION N/A 01/07/2017   Procedure: DIALYSIS/PERMA CATHETER INSERTION With an IVC filter placement;  Surgeon: Algernon Huxley, MD;  Location: [place] [facility];  Service: Cardiovascular;  Laterality: N/A;  . DIALYSIS/PERMA CATHETER REMOVAL N/A 08/20/2016   Procedure: Dialysis/Perma Catheter Removal;  Surgeon: Algernon Huxley, MD;  Location: [place] [facility];  Service: Cardiovascular;  Laterality: N/A;  . DIALYSIS/PERMA CATHETER REMOVAL N/A 08/24/2017   Procedure: DIALYSIS/PERMA CATHETER REMOVAL;  Surgeon: Katha Cabal, MD;  Location: [place] [facility];  Service: Cardiovascular;  Laterality: N/A;  . EYE SURGERY     cataracts bilateral  . FEMUR IM NAIL Left 12/15/2015   Procedure: INTRAMEDULLARY (IM) RETROGRADE FEMORAL NAILING;  Surgeon: Oletta Cohn, DO;  Location: [facility];  Service: Orthopedics;  Laterality: Left;  . HIP PINNING,CANNULATED Left 05/10/2016   Procedure: CANNULATED HIP PINNING;  Surgeon: Earnestine Leys, MD;  Location: [facility];  Service: Orthopedics;  Laterality: Left;  . INSERT / REPLACE / REMOVE PACEMAKER    . INSERTION OF DIALYSIS CATHETER  07/2010  . IVC FILTER INSERTION  2019  . JOINT REPLACEMENT     left knee  . MINOR REMOVAL OF PERITONEAL DIALYSIS CATHETER  04/15/2016   Procedure: MINOR REMOVAL OF PERITONEAL DIALYSIS CATHETER;  Surgeon: Algernon Huxley, MD;  Location: [facility];  Service: Vascular;;  . ORIF CLAVICULAR FRACTURE Right 02/16/2017    Procedure: OPEN REDUCTION INTERNAL FIXATION (ORIF) CLAVICULAR FRACTURE;  Surgeon: Corky Mull, MD;  Location: [facility];  Service: Orthopedics;  Laterality: Right;  . PACEMAKER INSERTION  2006  . PERIPHERAL VASCULAR CATHETERIZATION N/A 12/18/2015   Procedure: Dialysis/Perma Catheter Insertion;  Surgeon: Katha Cabal, MD;  Location: [place] [facility];  Service: Cardiovascular;  Laterality: N/A;  . TOTAL KNEE ARTHROPLASTY  2008   LEFT/Dr Calif     Home Meds: Prior to Admission medications   Medication Sig Start Date End Date Taking? Authorizing Provider  acetaminophen (TYLENOL) 325 MG tablet Take 2 tablets (650 mg total) by mouth every 4 (four) hours as needed for mild pain or moderate pain. 05/14/16  Yes Wieting, Richard, MD  cefdinir (OMNICEF) 300 MG capsule Take 1 capsule (300 mg total) by mouth every other day for 14 days. 09/08/17 09/22/17 Yes Gouru, Aruna, MD  guaiFENesin-dextromethorphan (ROBITUSSIN DM) 100-10 MG/5ML syrup Take 5 mLs by mouth every 4 (four) hours as needed for cough. 06/01/17  Yes Dustin Flock, MD  hydrocortisone 2.5 % cream Apply 1 application topically 3 (three) times daily.    Yes [provider]  hydrOXYzine (ATARAX/VISTARIL) 25 MG tablet Take 12.5 mg by mouth 3 (three) times daily.   Yes [provider]  latanoprost (XALATAN) 0.005 % ophthalmic solution Place 1 drop into both eyes at bedtime.   Yes [provider]  levothyroxine (SYNTHROID, LEVOTHROID) 25 MCG tablet Take 1 tablet (25 mcg total) by mouth daily. 02/04/17  Yes Bettey Costa, MD  lidocaine (LMX) 4 % cream Apply 1 application topically every Monday, Wednesday, and Friday with hemodialysis.   Yes [provider]  metoprolol succinate (TOPROL-XL) 25 MG 24 hr tablet Take 1 tablet (25 mg total) by mouth daily. 06/01/17  Yes Dustin Flock, MD  multivitamin (RENA-VIT) TABS tablet Take 1 tablet by mouth at bedtime. 09/07/17  Yes Gouru, Aruna, MD  ondansetron (ZOFRAN) 4 MG  tablet Take 1 tablet (4 mg total) by mouth every 6 (six) hours as needed for nausea. 09/07/17  Yes Gouru, Illene Silver, MD  sevelamer carbonate (RENVELA) 800 MG tablet Take 1 tablet (800 mg total) by mouth 3 (three) times daily with meals. 06/01/17  Yes Dustin Flock, MD  traMADol (ULTRAM) 50 MG tablet Take 1-2 tablets (50-100 mg total) by mouth every 6 (six) hours as needed for moderate pain. 09/07/17  Yes Gouru, Illene Silver, MD  vitamin C (VITAMIN C) 250 MG tablet Take 1 tablet (250 mg total) by mouth 2 (two) times daily. 09/07/17  Yes Gouru, Illene Silver, MD  Vitamin D, Ergocalciferol, (DRISDOL) 50000 units CAPS capsule Take 50,000 Units by mouth every Monday.  04/17/15  Yes [provider]  Amino Acids-Protein Hydrolys (FEEDING SUPPLEMENT, PRO-STAT SUGAR FREE 64,) LIQD Take 30 mLs by mouth 2 (two) times daily. 09/07/17   Nicholes Mango, MD    Inpatient Medications:  . cefdinir  300 mg Oral QODAY  . docusate sodium  100 mg Oral BID  .  feeding supplement  1 Container Oral TID BM  . feeding supplement (PRO-STAT SUGAR FREE 64)  30 mL Oral BID  . hydrOXYzine  12.5 mg Oral TID  . latanoprost  1 drop Both Eyes QHS  . levothyroxine  25 mcg Oral QAC breakfast  . lidocaine  1 application Topical Q M,W,F-HD  . metoprolol succinate  25 mg Oral Daily  . multivitamin  1 tablet Oral QHS  . multivitamin-lutein  1 capsule Oral Daily  . sevelamer carbonate  800 mg Oral TID WC  . vitamin C  250 mg Oral BID  . [START ON 09/20/2017] Vitamin D (Ergocalciferol)  50,000 Units Oral Q Mon     Allergies:  Allergies  Allergen Reactions  . Statins Other (See Comments)    Muscle weakness severe  . Codeine Sulfate Nausea And Vomiting  . Codeine Other (See Comments)    GI UPSET  . Effexor [Venlafaxine] Nausea Only  . Ezetimibe-Simvastatin Other (See Comments)    Muscle weakness    Social History   Socioeconomic History  . Marital status: Widowed    Spouse name: Not on file  . Number of children: 0  . Years of  education: Not on file  . Highest education level: Not on file  Occupational History  . Occupation: Retired   Scientific laboratory technician  . Financial resource strain: Not on file  . Food insecurity:    Worry: Patient refused    Inability: Patient refused  . Transportation needs:    Medical: Patient refused    Non-medical: Patient refused  Tobacco Use  . Smoking status: Former Smoker    Last attempt to quit: 03/31/1948    Years since quitting: 69.5  . Smokeless tobacco: Never Used  Substance and Sexual Activity  . Alcohol use: No  . Drug use: No  . Sexual activity: Never  Lifestyle  . Physical activity:    Days per week: Patient refused    Minutes per session: Patient refused  . Stress: Patient refused  Relationships  . Social connections:    Talks on phone: Patient refused    Gets together: Patient refused    Attends religious service: Patient refused    Active member of club or organization: Patient refused    Attends meetings of clubs or organizations: Patient refused    Relationship status: Patient refused  . Intimate partner violence:    Fear of current or ex partner: Patient refused    Emotionally abused: Patient refused    Physically abused: Patient refused    Forced sexual activity: Patient refused  Other Topics Concern  . Not on file  Social History Narrative   Lives in [place] alone. Widow 2012.      Regular Exercise -  Housework   Daily Caffeine Use:  1 - 2 cups coffee              Family History  Problem Relation Age of Onset  . Cancer Mother        ? ovarian - sarcoma  . Cancer Father        Skin cancer  . Diabetes Sister   . COPD Sister   . Depression Sister   . Cancer Sister        Lung - 36 yrs old     Review of Systems: A 12-system review of systems was performed and is negative except as noted in the HPI.  Labs: Recent Labs    09/16/17 0528 09/16/17 1115 09/16/17 1651 09/17/17 0157  TROPONINI 0.11* 0.13* 0.46* 0.76*   Lab Results   Component Value Date   WBC 5.3 09/16/2017   HGB 11.7 (L) 09/16/2017   HCT 35.6 09/16/2017   MCV 96.8 09/16/2017   PLT 197 09/16/2017    Recent Labs  Lab 09/16/17 0118  NA 138  K 3.5  CL 99  CO2 27  BUN 43*  CREATININE 3.57*  CALCIUM 8.4*  PROT 7.2  BILITOT 1.1  ALKPHOS 86  ALT 16  AST 23  GLUCOSE 88   Lab Results  Component Value Date   CHOL 283 (A) 09/03/2014   HDL 72 (A) 09/03/2014   LDLCALC 173 09/03/2014   TRIG 190 (A) 09/03/2014   No results found for: DDIMER  Radiology/Studies:  Dg Chest 1 View  Result Date: 09/17/2017 CLINICAL DATA:  Follow-up incomplete re-expansion of the right lung following thoracentesis. EXAM: CHEST  1 VIEW COMPARISON:  09/16/2017 FINDINGS: There remains some minimal lack of re-expansion of the right upper lobe. The more inferior component has shown some reaccumulation of fluid in the interim. Mild basilar atelectasis is noted on the right as well as within the right upper lobe. The left lung remains clear. Cardiac shadow is enlarged. IMPRESSION: Slight reaccumulation of right pleural fluid inferiorly. There remains some incomplete re-expansion of the lung following thoracentesis. Continued follow-up is recommended and correlation with patient's clinical symptomatology. Atelectatic changes in the right upper and right lower lobes. Electronically Signed   By: Inez Catalina M.D.   On: 09/17/2017 09:39   Dg Chest 1 View  Result Date: 09/16/2017 CLINICAL DATA:  Status post right thoracentesis. EXAM: CHEST  1 VIEW COMPARISON:  09/16/2017 FINDINGS: Cardiac shadow is enlarged. Pacemaker is again identified. Previously seen large right pleural effusion has resolved following thoracentesis. Small pneumothorax is noted along the apex as well as the right lung base related incomplete re-expansion of the right lung. Left lung remains clear with minimal effusion. No acute bony abnormality is seen. Postsurgical changes of the right clavicle are noted.  IMPRESSION: Resolution of previously seen right-sided pleural effusion with incomplete re-expansion of the right lung with changes consistent with small pneumothorax. No chest tube is warranted at this time is the patient is asymptomatic. This will likely re-expand or refill with fluid over time. Follow-up chest x-ray on 09/17/2017 is recommended. Electronically Signed   By: Inez Catalina M.D.   On: 09/16/2017 16:04   Ct Head Wo Contrast  Result Date: 09/16/2017 CLINICAL DATA:  Fall with right parietal laceration. EXAM: CT HEAD WITHOUT CONTRAST CT CERVICAL SPINE WITHOUT CONTRAST TECHNIQUE: Multidetector CT imaging of the head and cervical spine was performed following the standard protocol without intravenous contrast. Multiplanar CT image reconstructions of the cervical spine were also generated. COMPARISON:  Multiple priors most recently 06/28/2017 FINDINGS: CT HEAD FINDINGS Brain: Unchanged degree of atrophy and chronic small vessel ischemia. No intracranial hemorrhage, mass effect, or midline shift. No hydrocephalus. The basilar cisterns are patent. No evidence of territorial infarct or acute ischemia. No extra-axial or intracranial fluid collection. Vascular: Atherosclerosis of skullbase vasculature without hyperdense vessel or abnormal calcification. Skull: No fracture or focal lesion. Sinuses/Orbits: Paranasal sinuses and mastoid air cells are clear. The visualized orbits are unremarkable. Bilateral cataract resection. Other: Right frontoparietal scalp hematoma. CT CERVICAL SPINE FINDINGS Alignment: No traumatic subluxation. Degenerative anterolisthesis of C4 on C5, unchanged from prior. Skull base and vertebrae: Comminuted mildly displaced fracture of the spinous process of C7 does not extend to the lamina or canal cortex. No  additional acute fracture. Dens and skull base are intact. Soft tissues and spinal canal: No prevertebral fluid or swelling. No visible canal hematoma. Disc levels: Similar degree of  degenerative disc disease and facet arthropathy from prior exam. Disc space narrowing and endplate spurring most prominent at C5-C6 and C6-C7. Upper chest: Large right pleural effusion partially included. Other: Carotid calcifications. IMPRESSION: 1. No acute intracranial abnormality. No skull fracture. Right frontoparietal scalp hematoma. 2. Acute mildly comminuted and displaced C7 spinous process fracture. No additional fracture or subluxation of the cervical spine. 3. Large right pleural effusion, partially included. These results were called by telephone at the time of interpretation on 09/16/2017 at 2:20 am to Dr. Lurline Hare , who verbally acknowledged these results. Electronically Signed   By: Jeb Levering M.D.   On: 09/16/2017 02:20   Ct Cervical Spine Wo Contrast  Result Date: 09/16/2017 CLINICAL DATA:  Fall with right parietal laceration. EXAM: CT HEAD WITHOUT CONTRAST CT CERVICAL SPINE WITHOUT CONTRAST TECHNIQUE: Multidetector CT imaging of the head and cervical spine was performed following the standard protocol without intravenous contrast. Multiplanar CT image reconstructions of the cervical spine were also generated. COMPARISON:  Multiple priors most recently 06/28/2017 FINDINGS: CT HEAD FINDINGS Brain: Unchanged degree of atrophy and chronic small vessel ischemia. No intracranial hemorrhage, mass effect, or midline shift. No hydrocephalus. The basilar cisterns are patent. No evidence of territorial infarct or acute ischemia. No extra-axial or intracranial fluid collection. Vascular: Atherosclerosis of skullbase vasculature without hyperdense vessel or abnormal calcification. Skull: No fracture or focal lesion. Sinuses/Orbits: Paranasal sinuses and mastoid air cells are clear. The visualized orbits are unremarkable. Bilateral cataract resection. Other: Right frontoparietal scalp hematoma. CT CERVICAL SPINE FINDINGS Alignment: No traumatic subluxation. Degenerative anterolisthesis of C4 on C5,  unchanged from prior. Skull base and vertebrae: Comminuted mildly displaced fracture of the spinous process of C7 does not extend to the lamina or canal cortex. No additional acute fracture. Dens and skull base are intact. Soft tissues and spinal canal: No prevertebral fluid or swelling. No visible canal hematoma. Disc levels: Similar degree of degenerative disc disease and facet arthropathy from prior exam. Disc space narrowing and endplate spurring most prominent at C5-C6 and C6-C7. Upper chest: Large right pleural effusion partially included. Other: Carotid calcifications. IMPRESSION: 1. No acute intracranial abnormality. No skull fracture. Right frontoparietal scalp hematoma. 2. Acute mildly comminuted and displaced C7 spinous process fracture. No additional fracture or subluxation of the cervical spine. 3. Large right pleural effusion, partially included. These results were called by telephone at the time of interpretation on 09/16/2017 at 2:20 am to Dr. Lurline Hare , who verbally acknowledged these results. Electronically Signed   By: Jeb Levering M.D.   On: 09/16/2017 02:20   Dg Chest Port 1 View  Result Date: 09/16/2017 CLINICAL DATA:  Fall, head laceration. EXAM: PORTABLE CHEST 1 VIEW COMPARISON:  Chest radiograph June 28, 2017 FINDINGS: Large RIGHT pleural effusion, increased from prior examination. No mediastinal shift. Persistent underlying consolidation. The cardiac silhouette is at least mildly enlarged, predominantly obscured. Calcified aortic arch. LEFT cardiac pacemaker. No pneumothorax. Osteopenia. RIGHT clavicle ORIF with nonunion. Interval removal of dialysis catheter. IMPRESSION: Large RIGHT pleural effusion increased from prior examination; underlying consolidation. Cardiomegaly. Aortic Atherosclerosis (ICD10-I70.0). Electronically Signed   By: Elon Alas M.D.   On: 09/16/2017 01:48   US Thoracentesis Asp Pleural Space W/img Guide  Result Date: 09/16/2017 INDICATION: Large  right-sided pleural effusion EXAM: ULTRASOUND GUIDED RIGHT THORACENTESIS MEDICATIONS: None. COMPLICATIONS: None immediate.  PROCEDURE: An ultrasound guided thoracentesis was thoroughly discussed with the patient and questions answered. The benefits, risks, alternatives and complications were also discussed. The patient understands and wishes to proceed with the procedure. Written consent was obtained. Ultrasound was performed to localize and mark an adequate pocket of fluid in the right chest. The area was then prepped and draped in the normal sterile fashion. 1% Lidocaine was used for local anesthesia. Under ultrasound guidance a 6 Fr Safe-T-Centesis catheter was introduced. Thoracentesis was performed. Air was noted at the termination of the thoracentesis indicating incomplete re-expansion of the right lung. The catheter was removed and a dressing applied. FINDINGS: A total of approximately 1.9 L of clear yellow fluid was removed. Samples were sent to the laboratory as requested by the clinical team. IMPRESSION: Successful ultrasound guided right thoracentesis yielding 1.9 L of pleural fluid. Air was noted being withdrawn at the end of the thoracentesis likely related incomplete re-expansion of the right lung. This does not represent a true pneumothorax but merely incomplete re-expansion. Follow-up chest x-ray in 1 day is recommended. Electronically Signed   By: Inez Catalina M.D.   On: 09/16/2017 16:08    Wt Readings from Last 3 Encounters:  09/17/17 46.3 kg  09/07/17 50.7 kg  07/01/17 39.6 kg    EKG: Atrial sensed ventricular paced rhythm  Physical Exam: Thin Caucasian female with multiple ecchymoses on her arms and head. Blood pressure 110/61, pulse 82, temperature (!) 97.5 F (36.4 C), resp. rate 16, height 5\' 4"  (1.626 m), weight 46.3 kg, SpO2 99 %. Body mass index is 17.51 kg/m. General: Thin Caucasian female in no acute distress. Head: Normocephalic,  sclera non-icteric, no xanthomas, nares  are without discharge.  Multiple contusions on her side of her head.  Neck: Negative for carotid bruits. JVD not elevated. Lungs: Clear bilaterally to auscultation without wheezes, rales, or rhonchi. Breathing is unlabored. Heart: RRR with S1 S2.  1/6 to 2/6 systolic murmur. Abdomen: Soft, non-tender, non-distended with normoactive bowel sounds. No hepatomegaly. No rebound/guarding. No obvious abdominal masses. Msk:  Strength and tone appear normal for age. Extremities: No clubbing or cyanosis. No edema.  Distal pedal pulses are 2+ and equal bilaterally.  Ecchymoses bilaterally. Neuro: Alert and responsive but somewhat disoriented to place and time.      Assessment and Plan   84 year old female with history of permanent pacemaker secondary to high-grade heart block which is functioning normally with atrial sensed ventricular pacing, history of end-stage renal disease on hemodialysis Monday Wednesday and Friday, history of coronary artery disease with cardiomyopathy with an ejection fraction of 20 to 25% by echo done 2 months ago with moderate TR moderate MR.  She also has elevated pulmonary pressures.  Patient was admitted after suffering a fall causing a head contusion without intracranial problems.  Etiology of the fall were unclear.  Patient is currently hemodynamically stable and complains of bruising on her body but no chest pain.  Patient had a mildly elevated serum troponin at 0.76 up from 0.11 on admission.  EKG is ventricular paced making ischemic evaluation unavailable however patient has no clinical history of ischemic sounding symptoms.  Etiology of the fall is unclear.  He has multiple possibilities including dysrhythmia given her cardiomyopathy with tachyarrhythmia.  Her pacemaker is functioning normally so bradycardia arrhythmia is an unlikely etiology.  Orthostatic hypotension may be playing a role.  Patient is asymptomatic from this at present.  No ventricular dysrhythmia noted thus  far on telemetry.  Elevated troponin likely  is also multifactorial to include demand ischemia in the face of renal insufficiency.  Would not proceed with invasive evaluation at this time given comorbid conditions.  Would continue with current regimen.  Would not placed on heparin.  Would proceed with hemodialysis today as scheduled.  Would not repeat echocardiogram as she had one less than 2 months ago.  We will follow with you. Signed, Teodoro Spray MD 09/17/2017, 10:17 AM Pager: [phone]

## 2017-09-17 NOTE — Progress Notes (Signed)
[place] [facility] [place] [name]    MR#:  [id number]  DATE OF BIRTH:  10/14/1926  SUBJECTIVE:  CHIEF COMPLAINT:   Chief Complaint  Patient presents with  . Fall  . Laceration   Sent after a fall.  Noted to have large right-sided pleural effusion and cervical vertebral fracture.  Patient is not very cooperative. No complains.  REVIEW OF SYSTEMS:     Review of Systems  Constitutional: Positive for malaise/fatigue. Negative for fever.  HENT: Negative for congestion, ear discharge, hearing loss and sore throat.   Eyes: Negative for blurred vision and double vision.  Respiratory: Negative for cough, sputum production and shortness of breath.   Cardiovascular: Negative for chest pain, palpitations, orthopnea and leg swelling.  Gastrointestinal: Negative for abdominal pain, heartburn, nausea and vomiting.  Musculoskeletal: Negative for back pain, joint pain and myalgias.  Skin: Negative for itching.  Neurological: Negative for dizziness, tingling, tremors and focal weakness.  Psychiatric/Behavioral: The patient is not nervous/anxious.     DRUG ALLERGIES:   Allergies  Allergen Reactions  . Statins Other (See Comments)    Muscle weakness severe  . Codeine Sulfate Nausea And Vomiting  . Codeine Other (See Comments)    GI UPSET  . Effexor [Venlafaxine] Nausea Only  . Ezetimibe-Simvastatin Other (See Comments)    Muscle weakness    VITALS:  Blood pressure (!) 101/50, pulse 65, temperature (!) 97.5 F (36.4 C), resp. rate 20, height 5\' 4"  (1.626 m), weight 46.3 kg, SpO2 93 %.  PHYSICAL EXAMINATION:  GENERAL:  84 y.o.-year-old malnourished patient lying in the bed with no acute distress.  EYES: Pupils equal, round, reactive to light and accommodation. No scleral icterus. Extraocular muscles intact.  HEENT: Head atraumatic, normocephalic. Oropharynx and nasopharynx clear.  NECK:  Supple, no jugular venous distention. No thyroid  enlargement, no tenderness.  LUNGS: Normal breath sounds bilaterally, no wheezing, rales,rhonchi or crepitation. No use of accessory muscles of respiration.  CARDIOVASCULAR: S1, S2 normal. No murmurs, rubs, or gallops.  ABDOMEN: Soft, nontender, nondistended. Bowel sounds present. No organomegaly or mass.  EXTREMITIES: No pedal edema, cyanosis, or clubbing.  NEUROLOGIC: No cranial nerve deficit, moves all 4 limbs, follows simple command, generalized weakness. PSYCHIATRIC: The patient is alert and oriented X2.  SKIN: No obvious rash, lesion, or ulcer.   Physical Exam LABORATORY PANEL:   CBC Recent Labs  Lab 09/17/17 0924  WBC 6.1  HGB 11.5*  HCT 34.8*  PLT 218   ------------------------------------------------------------------------------------------------------------------  Chemistries  Recent Labs  Lab 09/16/17 0118 09/17/17 0924  NA 138 140  K 3.5 4.3  CL 99 99  CO2 27 27  GLUCOSE 88 160*  BUN 43* 62*  CREATININE 3.57* 5.15*  CALCIUM 8.4* 8.8*  AST 23  --   ALT 16  --   ALKPHOS 86  --   BILITOT 1.1  --    ------------------------------------------------------------------------------------------------------------------  Cardiac Enzymes Recent Labs  Lab 09/17/17 0157 09/17/17 0924  TROPONINI 0.76* 1.05*   ------------------------------------------------------------------------------------------------------------------  RADIOLOGY:  Dg Chest 1 View  Result Date: 09/17/2017 CLINICAL DATA:  Follow-up incomplete re-expansion of the right lung following thoracentesis. EXAM: CHEST  1 VIEW COMPARISON:  09/16/2017 FINDINGS: There remains some minimal lack of re-expansion of the right upper lobe. The more inferior component has shown some reaccumulation of fluid in the interim. Mild basilar atelectasis is noted on the right as well as within the right upper lobe. The left lung remains clear. Cardiac  shadow is enlarged. IMPRESSION: Slight reaccumulation of right pleural  fluid inferiorly. There remains some incomplete re-expansion of the lung following thoracentesis. Continued follow-up is recommended and correlation with patient's clinical symptomatology. Atelectatic changes in the right upper and right lower lobes. Electronically Signed   By: Inez Catalina M.D.   On: 09/17/2017 09:39   Dg Chest 1 View  Result Date: 09/16/2017 CLINICAL DATA:  Status post right thoracentesis. EXAM: CHEST  1 VIEW COMPARISON:  09/16/2017 FINDINGS: Cardiac shadow is enlarged. Pacemaker is again identified. Previously seen large right pleural effusion has resolved following thoracentesis. Small pneumothorax is noted along the apex as well as the right lung base related incomplete re-expansion of the right lung. Left lung remains clear with minimal effusion. No acute bony abnormality is seen. Postsurgical changes of the right clavicle are noted. IMPRESSION: Resolution of previously seen right-sided pleural effusion with incomplete re-expansion of the right lung with changes consistent with small pneumothorax. No chest tube is warranted at this time is the patient is asymptomatic. This will likely re-expand or refill with fluid over time. Follow-up chest x-ray on 09/17/2017 is recommended. Electronically Signed   By: Inez Catalina M.D.   On: 09/16/2017 16:04   Ct Head Wo Contrast  Result Date: 09/16/2017 CLINICAL DATA:  Fall with right parietal laceration. EXAM: CT HEAD WITHOUT CONTRAST CT CERVICAL SPINE WITHOUT CONTRAST TECHNIQUE: Multidetector CT imaging of the head and cervical spine was performed following the standard protocol without intravenous contrast. Multiplanar CT image reconstructions of the cervical spine were also generated. COMPARISON:  Multiple priors most recently 06/28/2017 FINDINGS: CT HEAD FINDINGS Brain: Unchanged degree of atrophy and chronic small vessel ischemia. No intracranial hemorrhage, mass effect, or midline shift. No hydrocephalus. The basilar cisterns are patent. No  evidence of territorial infarct or acute ischemia. No extra-axial or intracranial fluid collection. Vascular: Atherosclerosis of skullbase vasculature without hyperdense vessel or abnormal calcification. Skull: No fracture or focal lesion. Sinuses/Orbits: Paranasal sinuses and mastoid air cells are clear. The visualized orbits are unremarkable. Bilateral cataract resection. Other: Right frontoparietal scalp hematoma. CT CERVICAL SPINE FINDINGS Alignment: No traumatic subluxation. Degenerative anterolisthesis of C4 on C5, unchanged from prior. Skull base and vertebrae: Comminuted mildly displaced fracture of the spinous process of C7 does not extend to the lamina or canal cortex. No additional acute fracture. Dens and skull base are intact. Soft tissues and spinal canal: No prevertebral fluid or swelling. No visible canal hematoma. Disc levels: Similar degree of degenerative disc disease and facet arthropathy from prior exam. Disc space narrowing and endplate spurring most prominent at C5-C6 and C6-C7. Upper chest: Large right pleural effusion partially included. Other: Carotid calcifications. IMPRESSION: 1. No acute intracranial abnormality. No skull fracture. Right frontoparietal scalp hematoma. 2. Acute mildly comminuted and displaced C7 spinous process fracture. No additional fracture or subluxation of the cervical spine. 3. Large right pleural effusion, partially included. These results were called by telephone at the time of interpretation on 09/16/2017 at 2:20 am to Dr. Lurline Hare , who verbally acknowledged these results. Electronically Signed   By: Jeb Levering M.D.   On: 09/16/2017 02:20   Ct Cervical Spine Wo Contrast  Result Date: 09/16/2017 CLINICAL DATA:  Fall with right parietal laceration. EXAM: CT HEAD WITHOUT CONTRAST CT CERVICAL SPINE WITHOUT CONTRAST TECHNIQUE: Multidetector CT imaging of the head and cervical spine was performed following the standard protocol without intravenous contrast.  Multiplanar CT image reconstructions of the cervical spine were also generated. COMPARISON:  Multiple priors most  recently 06/28/2017 FINDINGS: CT HEAD FINDINGS Brain: Unchanged degree of atrophy and chronic small vessel ischemia. No intracranial hemorrhage, mass effect, or midline shift. No hydrocephalus. The basilar cisterns are patent. No evidence of territorial infarct or acute ischemia. No extra-axial or intracranial fluid collection. Vascular: Atherosclerosis of skullbase vasculature without hyperdense vessel or abnormal calcification. Skull: No fracture or focal lesion. Sinuses/Orbits: Paranasal sinuses and mastoid air cells are clear. The visualized orbits are unremarkable. Bilateral cataract resection. Other: Right frontoparietal scalp hematoma. CT CERVICAL SPINE FINDINGS Alignment: No traumatic subluxation. Degenerative anterolisthesis of C4 on C5, unchanged from prior. Skull base and vertebrae: Comminuted mildly displaced fracture of the spinous process of C7 does not extend to the lamina or canal cortex. No additional acute fracture. Dens and skull base are intact. Soft tissues and spinal canal: No prevertebral fluid or swelling. No visible canal hematoma. Disc levels: Similar degree of degenerative disc disease and facet arthropathy from prior exam. Disc space narrowing and endplate spurring most prominent at C5-C6 and C6-C7. Upper chest: Large right pleural effusion partially included. Other: Carotid calcifications. IMPRESSION: 1. No acute intracranial abnormality. No skull fracture. Right frontoparietal scalp hematoma. 2. Acute mildly comminuted and displaced C7 spinous process fracture. No additional fracture or subluxation of the cervical spine. 3. Large right pleural effusion, partially included. These results were called by telephone at the time of interpretation on 09/16/2017 at 2:20 am to Dr. Lurline Hare , who verbally acknowledged these results. Electronically Signed   By: Jeb Levering M.D.    On: 09/16/2017 02:20   Dg Chest Port 1 View  Result Date: 09/16/2017 CLINICAL DATA:  Fall, head laceration. EXAM: PORTABLE CHEST 1 VIEW COMPARISON:  Chest radiograph June 28, 2017 FINDINGS: Large RIGHT pleural effusion, increased from prior examination. No mediastinal shift. Persistent underlying consolidation. The cardiac silhouette is at least mildly enlarged, predominantly obscured. Calcified aortic arch. LEFT cardiac pacemaker. No pneumothorax. Osteopenia. RIGHT clavicle ORIF with nonunion. Interval removal of dialysis catheter. IMPRESSION: Large RIGHT pleural effusion increased from prior examination; underlying consolidation. Cardiomegaly. Aortic Atherosclerosis (ICD10-I70.0). Electronically Signed   By: Elon Alas M.D.   On: 09/16/2017 01:48   US Thoracentesis Asp Pleural Space W/img Guide  Result Date: 09/16/2017 INDICATION: Large right-sided pleural effusion EXAM: ULTRASOUND GUIDED RIGHT THORACENTESIS MEDICATIONS: None. COMPLICATIONS: None immediate. PROCEDURE: An ultrasound guided thoracentesis was thoroughly discussed with the patient and questions answered. The benefits, risks, alternatives and complications were also discussed. The patient understands and wishes to proceed with the procedure. Written consent was obtained. Ultrasound was performed to localize and mark an adequate pocket of fluid in the right chest. The area was then prepped and draped in the normal sterile fashion. 1% Lidocaine was used for local anesthesia. Under ultrasound guidance a 6 Fr Safe-T-Centesis catheter was introduced. Thoracentesis was performed. Air was noted at the termination of the thoracentesis indicating incomplete re-expansion of the right lung. The catheter was removed and a dressing applied. FINDINGS: A total of approximately 1.9 L of clear yellow fluid was removed. Samples were sent to the laboratory as requested by the clinical team. IMPRESSION: Successful ultrasound guided right thoracentesis  yielding 1.9 L of pleural fluid. Air was noted being withdrawn at the end of the thoracentesis likely related incomplete re-expansion of the right lung. This does not represent a true pneumothorax but merely incomplete re-expansion. Follow-up chest x-ray in 1 day is recommended. Electronically Signed   By: Inez Catalina M.D.   On: 09/16/2017 16:08    ASSESSMENT AND PLAN:  Active Problems:   Fall  1.  Elevated troponin: Ruled out myocardial ischemia.   EKG shows paced rhythm.   patient denies chest pain.  However she does have shortness of breath likely secondary to her pleural effusion.  Etiology of elevated troponin includes poor renal clearance, demand ischemia    Continue to monitor telemetry.  Follow cardiac biomarkers.  Cardiology consult appreciated, suggested no further work-up.  2.  Pleural effusion:   thoracentesis -1.9 L fluid removed. Differential diagnosis of etiology includes end-stage renal disease versus CHF.  Appreciated Consult pulmonology.  3.  Fall: Fall precautions; PT/OT eval suggest home health physical therapy on discharge.   4.  ESRD: On hemodialysis Monday Wednesday Friday.  Consult nephrology for continuation of dialysis.  5.  Cervical fracture: C7; emergency department physician discussed with neurosurgery placement of hard collar if tolerated or soft collar if necessary.  He said to get cervical spine x-ray with flexion and extension but patient is not cooperative. Fracture is stable.  She has no neurologic deficits. She is not very compliant with cervical collar, and does not have very good balance.  I had spoke to her family on the phone and explained them about this, they understand that if her fractures get worse or she develops some new injuries she can have cord compression which can lead to paralysis, respiratory or cardiac arrest.  They understand but patient's stubborn and would not keep the cervical collar on so we do not have anything much to help with in  this issue.  6.  DVT prophylaxis: SCDs 7.  GI prophylaxis: None 8.  Recent cellulitis on legs     Currently on Omnicef, since discharged last week, continue.   All the records are reviewed and case discussed with Care Management/Social Workerr. Management plans discussed with the patient, family and they are in agreement.  CODE STATUS: DNR  TOTAL TIME TAKING CARE OF THIS PATIENT: 35 minutes.    POSSIBLE D/C IN 1-2 DAYS, DEPENDING ON CLINICAL CONDITION.   Vaughan Basta M.D on 09/17/2017   Between 7am to 6pm - Pager - [phone]  After 6pm go to [email or web address] - password [email or web address] [facility]  Office  [phone]  CC: Primary care physician; Kirk Ruths, MD  Note: This dictation was prepared with Dragon dictation along with smaller phrase technology. Any transcriptional errors that result from this process are unintentional.

## 2017-09-17 NOTE — Progress Notes (Signed)
Initial Nutrition Assessment    DOCUMENTATION CODES:   Severe malnutrition in context of chronic illness  INTERVENTION:   Prostat liquid protein PO 30 ml TID with meals, each supplement provides 100 kcal, 15 grams protein.  Boost Breeze po TID, each supplement provides 250 kcal and 9 grams of protein  Rena-vite daily  Ocuvite daily for wound healing (provides zinc, vitamin A, vitamin C, Vitamin E, copper, and selenium)  Vitamin C 250mg  po BID  Dysphagia 3 diet   NUTRITION DIAGNOSIS:   Severe Malnutrition related to chronic illness(ESRD on HD, FTT) as evidenced by severe fat depletion, severe muscle depletion.  GOAL:   Patient will meet greater than or equal to 90% of their needs   MONITOR:   PO intake, Supplement acceptance, Labs, Weight trends, Skin, I & O's   ASSESSMENT:   84 year old female recently discharged from the hospital on 09/07/2017 due to failure to thrive, severe peripheral vascular disease, left leg cellulitis.  Her history is significant for coronary artery disease, MI, status post pacemaker, history of DVT/PE status post IVC filter placement, CHF, end-stage renal disease on dialysis. Pt was discharged back to [facility], palliative care conversations during previous admission. Patient presents to the hospital earlier this morning for an unwitnessed fall with scalp laceration. Subsequently was noted that the patient had elevated troponin, pleural effusion, C7 fracture, treated with collar.   Pt s/p thoracentesis with 1.5ml fluid removal 8/22  Pt well known to nutrition department from multiple previous admits. Pt is a long term suspected scurvy patient. Pt with severe malnutrition and poor oral intake at baseline. Pt does not like supplements but will occasionally drink Boost Breeze or Prostat if poured on ice. Per chart, pt is weight stable. RD will add supplements and MVI. Pt requires a dysphagia 3 diet. Palliative care following pt during last admit. RD  will monitor for GOC.     Medications reviewed and include: omnicef, colace, synthroid, rena-vite, ocuvite, renvela, vitamin C, vit D  Labs reviewed: K 4.3 wnl, BUN 62(H), creat 5.15(H), P 7.7(H)  Diet Order:   Diet Order            DIET DYS 3 Room service appropriate? Yes; Fluid consistency: Thin  Diet effective now             EDUCATION NEEDS:   No education needs have been identified at this time  Skin:  Skin Assessment: Reviewed RN Assessment(two areas noted open on the right lower ext: each aprox. 2cm x 1.5cm and 3cm x 1.5cm x 0.1cm, ecchymosis, skin tears)  Last BM:  8/23- type 6  Height:   Ht Readings from Last 1 Encounters:  09/16/17 5\' 4"  (1.626 m)    Weight:   Wt Readings from Last 1 Encounters:  09/17/17 46.3 kg    Ideal Body Weight:  54.5 kg  BMI:  Body mass index is 17.51 kg/m.  Estimated Nutritional Needs:   Kcal:  1200-1500kcal/day   Protein:  78-88g/day   Fluid:  1.2L/day   Koleen Distance MS, RD, LDN Pager #- [phone] Office#- [phone] After Hours Pager: [phone]

## 2017-09-18 ENCOUNTER — Other Ambulatory Visit: Payer: Self-pay

## 2017-09-18 DIAGNOSIS — J918 Pleural effusion in other conditions classified elsewhere: Secondary | ICD-10-CM | POA: Diagnosis present

## 2017-09-18 DIAGNOSIS — J90 Pleural effusion, not elsewhere classified: Secondary | ICD-10-CM | POA: Diagnosis present

## 2017-09-18 DIAGNOSIS — F0390 Unspecified dementia without behavioral disturbance: Secondary | ICD-10-CM | POA: Diagnosis present

## 2017-09-18 DIAGNOSIS — J9811 Atelectasis: Secondary | ICD-10-CM | POA: Diagnosis present

## 2017-09-18 DIAGNOSIS — N2581 Secondary hyperparathyroidism of renal origin: Secondary | ICD-10-CM | POA: Diagnosis present

## 2017-09-18 DIAGNOSIS — I2510 Atherosclerotic heart disease of native coronary artery without angina pectoris: Secondary | ICD-10-CM | POA: Diagnosis present

## 2017-09-18 DIAGNOSIS — I5022 Chronic systolic (congestive) heart failure: Secondary | ICD-10-CM | POA: Diagnosis present

## 2017-09-18 DIAGNOSIS — Y92129 Unspecified place in nursing home as the place of occurrence of the external cause: Secondary | ICD-10-CM | POA: Diagnosis not present

## 2017-09-18 DIAGNOSIS — J9621 Acute and chronic respiratory failure with hypoxia: Secondary | ICD-10-CM | POA: Diagnosis present

## 2017-09-18 DIAGNOSIS — W19XXXD Unspecified fall, subsequent encounter: Secondary | ICD-10-CM | POA: Diagnosis not present

## 2017-09-18 DIAGNOSIS — G9341 Metabolic encephalopathy: Secondary | ICD-10-CM | POA: Diagnosis present

## 2017-09-18 DIAGNOSIS — H8109 Meniere's disease, unspecified ear: Secondary | ICD-10-CM | POA: Diagnosis present

## 2017-09-18 DIAGNOSIS — I252 Old myocardial infarction: Secondary | ICD-10-CM | POA: Diagnosis not present

## 2017-09-18 DIAGNOSIS — G4733 Obstructive sleep apnea (adult) (pediatric): Secondary | ICD-10-CM | POA: Diagnosis present

## 2017-09-18 DIAGNOSIS — Z66 Do not resuscitate: Secondary | ICD-10-CM | POA: Diagnosis present

## 2017-09-18 DIAGNOSIS — H9190 Unspecified hearing loss, unspecified ear: Secondary | ICD-10-CM | POA: Diagnosis present

## 2017-09-18 DIAGNOSIS — E43 Unspecified severe protein-calorie malnutrition: Secondary | ICD-10-CM | POA: Diagnosis present

## 2017-09-18 DIAGNOSIS — I132 Hypertensive heart and chronic kidney disease with heart failure and with stage 5 chronic kidney disease, or end stage renal disease: Secondary | ICD-10-CM | POA: Diagnosis present

## 2017-09-18 DIAGNOSIS — Z515 Encounter for palliative care: Secondary | ICD-10-CM | POA: Diagnosis not present

## 2017-09-18 DIAGNOSIS — D631 Anemia in chronic kidney disease: Secondary | ICD-10-CM | POA: Diagnosis present

## 2017-09-18 DIAGNOSIS — I4430 Unspecified atrioventricular block: Secondary | ICD-10-CM | POA: Diagnosis present

## 2017-09-18 DIAGNOSIS — S12600A Unspecified displaced fracture of seventh cervical vertebra, initial encounter for closed fracture: Secondary | ICD-10-CM | POA: Diagnosis present

## 2017-09-18 DIAGNOSIS — W19XXXA Unspecified fall, initial encounter: Secondary | ICD-10-CM | POA: Diagnosis present

## 2017-09-18 DIAGNOSIS — I429 Cardiomyopathy, unspecified: Secondary | ICD-10-CM | POA: Diagnosis present

## 2017-09-18 DIAGNOSIS — I739 Peripheral vascular disease, unspecified: Secondary | ICD-10-CM | POA: Diagnosis present

## 2017-09-18 DIAGNOSIS — S129XXA Fracture of neck, unspecified, initial encounter: Secondary | ICD-10-CM | POA: Diagnosis not present

## 2017-09-18 DIAGNOSIS — N186 End stage renal disease: Secondary | ICD-10-CM | POA: Diagnosis not present

## 2017-09-18 DIAGNOSIS — Z992 Dependence on renal dialysis: Secondary | ICD-10-CM | POA: Diagnosis not present

## 2017-09-18 DIAGNOSIS — Z7189 Other specified counseling: Secondary | ICD-10-CM | POA: Diagnosis not present

## 2017-09-18 DIAGNOSIS — I248 Other forms of acute ischemic heart disease: Secondary | ICD-10-CM | POA: Diagnosis present

## 2017-09-18 DIAGNOSIS — R0603 Acute respiratory distress: Secondary | ICD-10-CM | POA: Diagnosis present

## 2017-09-18 LAB — PHOSPHORUS: Phosphorus: 6.6 mg/dL — ABNORMAL HIGH (ref 2.5–4.6)

## 2017-09-18 MED ORDER — LIDOCAINE HCL (PF) 1 % IJ SOLN
5.0000 mL | INTRAMUSCULAR | Status: DC | PRN
  Filled 2017-09-18: qty 5

## 2017-09-18 MED ORDER — SODIUM CHLORIDE 0.9 % IV SOLN: 100.0000 mL | INTRAVENOUS | Status: DC | PRN

## 2017-09-18 MED ORDER — ALTEPLASE 2 MG IJ SOLR
2.0000 mg | Freq: Once | INTRAMUSCULAR | Status: DC | PRN
  Filled 2017-09-18: qty 2

## 2017-09-18 MED ORDER — LIDOCAINE-PRILOCAINE 2.5-2.5 % EX CREA
1.0000 "application " | TOPICAL_CREAM | CUTANEOUS | Status: DC | PRN
  Filled 2017-09-18: qty 5

## 2017-09-18 MED ORDER — PENTAFLUOROPROP-TETRAFLUOROETH EX AERO
1.0000 "application " | INHALATION_SPRAY | CUTANEOUS | Status: DC | PRN
  Filled 2017-09-18: qty 30

## 2017-09-18 MED ORDER — HEPARIN SODIUM (PORCINE) 1000 UNIT/ML DIALYSIS
1000.0000 [IU] | INTRAMUSCULAR | Status: DC | PRN
  Filled 2017-09-18: qty 1

## 2017-09-18 NOTE — Progress Notes (Signed)
OT Cancellation Note  Patient Details [name] MRN: [id number] DOB: [birth year]   Cancelled Treatment:    Reason Eval/Treat Not Completed: Patient at procedure or test/ unavailable(Pt. eating breakfast, and preparing to go to dialysis. Will attempt initial OT visit at a later time, or date.)  Harrel Carina, MS, OTR/L 09/18/2017, 10:08 AM

## 2017-09-18 NOTE — Progress Notes (Signed)
[facility]  ROUNDING NOTE   Subjective:  Pt didn't have HD yesterday. Dialysis schedule for today. Resting comfortably. Quite hard of hearing.      Objective:  Vital signs in last 24 hours:  Temp:  [97.4 F (36.3 C)] 97.4 F (36.3 C) (08/24 0431) Pulse Rate:  [65-87] 78 (08/24 0431) Resp:  [17-20] 17 (08/24 0431) BP: (101-119)/(50-72) 119/72 (08/24 0431) SpO2:  [88 %-98 %] 91 % (08/24 0431)  Weight change:  Filed Weights   09/16/17 0109 09/17/17 0422  Weight: 50.7 kg 46.3 kg    Intake/Output: No intake/output data recorded.   Intake/Output this shift:  No intake/output data recorded.  Physical Exam: General: NAD   Head: +hard of hearing, Moist oral mucosal membranes  Eyes: Anicteric   Neck: supple  Lungs:  Clear bilateral, normal effort  Heart: Regular rate and rhythm  Abdomen:  Soft, nontender, BS present  Extremities: no peripheral edema.   Neurologic: Nonfocal, moving all four extremities  Skin: Bilateral UE ecchymoses  Access: Left AVF    Basic Metabolic Panel: Recent Labs  Lab 09/16/17 0118 09/17/17 0924  NA 138 140  K 3.5 4.3  CL 99 99  CO2 27 27  GLUCOSE 88 160*  BUN 43* 62*  CREATININE 3.57* 5.15*  CALCIUM 8.4* 8.8*    Liver Function Tests: Recent Labs  Lab 09/16/17 0118  AST 23  ALT 16  ALKPHOS 86  BILITOT 1.1  PROT 7.2  ALBUMIN 3.5   No results for input(s): LIPASE, AMYLASE in the last 168 hours. No results for input(s): AMMONIA in the last 168 hours.  CBC: Recent Labs  Lab 09/16/17 0118 09/17/17 0924  WBC 5.3 6.1  HGB 11.7* 11.5*  HCT 35.6 34.8*  MCV 96.8 97.2  PLT 197 218    Cardiac Enzymes: Recent Labs  Lab 09/16/17 0528 09/16/17 1115 09/16/17 1651 09/17/17 0157 09/17/17 0924  TROPONINI 0.11* 0.13* 0.46* 0.76* 1.05*    BNP: Invalid input(s): POCBNP  CBG: No results for input(s): GLUCAP in the last 168 hours.  Microbiology: Results for orders placed or performed during the hospital  encounter of 09/16/17  Body fluid culture     Status: None (Preliminary result)   Collection Time: 09/16/17  3:25 PM  Result Value Ref Range Status   Specimen Description   Final    PLEURAL Performed at [facility], [address] [place], [place] [address]    Special Requests   Final    NONE Performed at [facility], [address] [place], [place] [address]    Gram Stain   Final    RARE WBC PRESENT, PREDOMINANTLY MONONUCLEAR NO ORGANISMS SEEN    Culture   Final    NO GROWTH < 12 HOURS Performed at [facility], [place] [address], [place],  [address]    Report Status PENDING  Incomplete    Coagulation Studies: No results for input(s): LABPROT, INR in the last 72 hours.  Urinalysis: No results for input(s): COLORURINE, LABSPEC, PHURINE, GLUCOSEU, HGBUR, BILIRUBINUR, KETONESUR, PROTEINUR, UROBILINOGEN, NITRITE, LEUKOCYTESUR in the last 72 hours.  Invalid input(s): APPERANCEUR    Imaging: Dg Chest 1 View  Result Date: 09/17/2017 CLINICAL DATA:  Follow-up incomplete re-expansion of the right lung following thoracentesis. EXAM: CHEST  1 VIEW COMPARISON:  09/16/2017 FINDINGS: There remains some minimal lack of re-expansion of the right upper lobe. The more inferior component has shown some reaccumulation of fluid in the interim. Mild basilar atelectasis is noted  on the right as well as within the right upper lobe. The left lung remains clear. Cardiac shadow is enlarged. IMPRESSION: Slight reaccumulation of right pleural fluid inferiorly. There remains some incomplete re-expansion of the lung following thoracentesis. Continued follow-up is recommended and correlation with patient's clinical symptomatology. Atelectatic changes in the right upper and right lower lobes. Electronically Signed   By: Inez Catalina M.D.   On: 09/17/2017 09:39   Dg Chest 1 View  Result Date: 09/16/2017 CLINICAL DATA:  Status post right thoracentesis. EXAM: CHEST  1 VIEW  COMPARISON:  09/16/2017 FINDINGS: Cardiac shadow is enlarged. Pacemaker is again identified. Previously seen large right pleural effusion has resolved following thoracentesis. Small pneumothorax is noted along the apex as well as the right lung base related incomplete re-expansion of the right lung. Left lung remains clear with minimal effusion. No acute bony abnormality is seen. Postsurgical changes of the right clavicle are noted. IMPRESSION: Resolution of previously seen right-sided pleural effusion with incomplete re-expansion of the right lung with changes consistent with small pneumothorax. No chest tube is warranted at this time is the patient is asymptomatic. This will likely re-expand or refill with fluid over time. Follow-up chest x-ray on 09/17/2017 is recommended. Electronically Signed   By: Inez Catalina M.D.   On: 09/16/2017 16:04   US Thoracentesis Asp Pleural Space W/img Guide  Result Date: 09/16/2017 INDICATION: Large right-sided pleural effusion EXAM: ULTRASOUND GUIDED RIGHT THORACENTESIS MEDICATIONS: None. COMPLICATIONS: None immediate. PROCEDURE: An ultrasound guided thoracentesis was thoroughly discussed with the patient and questions answered. The benefits, risks, alternatives and complications were also discussed. The patient understands and wishes to proceed with the procedure. Written consent was obtained. Ultrasound was performed to localize and mark an adequate pocket of fluid in the right chest. The area was then prepped and draped in the normal sterile fashion. 1% Lidocaine was used for local anesthesia. Under ultrasound guidance a 6 Fr Safe-T-Centesis catheter was introduced. Thoracentesis was performed. Air was noted at the termination of the thoracentesis indicating incomplete re-expansion of the right lung. The catheter was removed and a dressing applied. FINDINGS: A total of approximately 1.9 L of clear yellow fluid was removed. Samples were sent to the laboratory as requested by  the clinical team. IMPRESSION: Successful ultrasound guided right thoracentesis yielding 1.9 L of pleural fluid. Air was noted being withdrawn at the end of the thoracentesis likely related incomplete re-expansion of the right lung. This does not represent a true pneumothorax but merely incomplete re-expansion. Follow-up chest x-ray in 1 day is recommended. Electronically Signed   By: Inez Catalina M.D.   On: 09/16/2017 16:08     Medications:    . cefdinir  300 mg Oral QODAY  . Chlorhexidine Gluconate Cloth  6 each Topical Q0600  . docusate sodium  100 mg Oral BID  . feeding supplement  1 Container Oral TID BM  . feeding supplement (PRO-STAT SUGAR FREE 64)  30 mL Oral BID  . hydrOXYzine  12.5 mg Oral TID  . latanoprost  1 drop Both Eyes QHS  . levothyroxine  25 mcg Oral QAC breakfast  . lidocaine  1 application Topical Q M,W,F-HD  . metoprolol succinate  25 mg Oral Daily  . multivitamin  1 tablet Oral QHS  . multivitamin-lutein  1 capsule Oral Daily  . sevelamer carbonate  800 mg Oral TID WC  . vitamin C  250 mg Oral BID  . [START ON 09/20/2017] Vitamin D (Ergocalciferol)  50,000 Units Oral Q  Mon   acetaminophen **OR** acetaminophen, guaiFENesin-dextromethorphan, hydrocortisone cream, LORazepam, ondansetron **OR** ondansetron (ZOFRAN) IV, traMADol  Assessment/ Plan:  [name] is a 84 y.o. white female with end stage renal disease on hemodialysis, coronary artery disease, pacemaker placement, meniere's disease, congestive heart failure, peripheral vascular disease, multiple falls and fractures.   MWF [facility] permcath/left AVF EDW 46.5kg.   1. End-stage renal disease:  Pt didn't have HD yesterday, we plan for HD today, orders have been prepared.   2. Anemia of chronic kidney disease: Hemoglobin currently 11.5, hold off on epogen at this time.   3. Secondary hyperparathyroidism with hyperphosphatemia: We will check PTH and phosphorus with dialysis  treatment today.  Otherwise maintain the patient on sevelamer.  PTH has been high as outpt and we do plan to repeat this today.     LOS: 0 Javius Sylla 8/24/20197:38 AM

## 2017-09-18 NOTE — Progress Notes (Signed)
This note also relates to the following rows which could not be included: Resp - Cannot attach notes to unvalidated device data  Hd completed

## 2017-09-18 NOTE — Progress Notes (Signed)
This note also relates to the following rows which could not be included: Resp - Cannot attach notes to unvalidated device data BP - Cannot attach notes to unvalidated device data  Hd started  

## 2017-09-18 NOTE — Progress Notes (Signed)
[place] at [place] [name]    MR#:  [id number]  DATE OF BIRTH:  [birth year]  SUBJECTIVE:  CHIEF COMPLAINT:   Chief Complaint  Patient presents with  . Fall  . Laceration   Sent after a fall.  Noted to have large right-sided pleural effusion and cervical vertebral fracture.  Patient has confusion and is hard of hearing.  REVIEW OF SYSTEMS:   Review of Systems  Constitutional: Positive for malaise/fatigue. Negative for fever.  HENT: Negative for congestion, ear discharge, hearing loss and sore throat.   Eyes: Negative for blurred vision and double vision.  Respiratory: Negative for cough, sputum production and shortness of breath.   Cardiovascular: Negative for chest pain, palpitations, orthopnea and leg swelling.  Gastrointestinal: Negative for abdominal pain, heartburn, nausea and vomiting.  Musculoskeletal: Negative for back pain, joint pain and myalgias.  Skin: Negative for itching.  Neurological: Negative for dizziness, tingling, tremors and focal weakness.  Psychiatric/Behavioral: The patient is not nervous/anxious.     DRUG ALLERGIES:   Allergies  Allergen Reactions  . Statins Other (See Comments)    Muscle weakness severe  . Codeine Sulfate Nausea And Vomiting  . Codeine Other (See Comments)    GI UPSET  . Effexor [Venlafaxine] Nausea Only  . Ezetimibe-Simvastatin Other (See Comments)    Muscle weakness    VITALS:  Blood pressure 119/72, pulse 78, temperature (!) 97.4 F (36.3 C), temperature source Oral, resp. rate 17, height 5\' 4"  (1.626 m), weight 46.3 kg, SpO2 91 %.  PHYSICAL EXAMINATION:  GENERAL:  84 y.o.-year-old malnourished patient lying in the bed with no acute distress.  EYES: Pupils equal, round, reactive to light and accommodation. No scleral icterus. Extraocular muscles intact.  HEENT: Head atraumatic, normocephalic. Oropharynx and nasopharynx clear.  NECK:  Supple, no jugular venous  distention. No thyroid enlargement, no tenderness.  LUNGS: Normal breath sounds bilaterally, no wheezing, rales,rhonchi or crepitation. No use of accessory muscles of respiration.  CARDIOVASCULAR: S1, S2 normal. No murmurs, rubs, or gallops.  ABDOMEN: Soft, nontender, nondistended. Bowel sounds present. No organomegaly or mass.  EXTREMITIES: No pedal edema, cyanosis, or clubbing.  NEUROLOGIC: No cranial nerve deficit, moves all 4 limbs, follows simple command, generalized weakness. PSYCHIATRIC: The patient is drowsy SKIN: No obvious rash, lesion, or ulcer.   Physical Exam LABORATORY PANEL:   CBC Recent Labs  Lab 09/17/17 0924  WBC 6.1  HGB 11.5*  HCT 34.8*  PLT 218   ------------------------------------------------------------------------------------------------------------------  Chemistries  Recent Labs  Lab 09/16/17 0118 09/17/17 0924  NA 138 140  K 3.5 4.3  CL 99 99  CO2 27 27  GLUCOSE 88 160*  BUN 43* 62*  CREATININE 3.57* 5.15*  CALCIUM 8.4* 8.8*  AST 23  --   ALT 16  --   ALKPHOS 86  --   BILITOT 1.1  --    ------------------------------------------------------------------------------------------------------------------  Cardiac Enzymes Recent Labs  Lab 09/17/17 0157 09/17/17 0924  TROPONINI 0.76* 1.05*   ------------------------------------------------------------------------------------------------------------------  RADIOLOGY:  Dg Chest 1 View  Result Date: 09/17/2017 CLINICAL DATA:  Follow-up incomplete re-expansion of the right lung following thoracentesis. EXAM: CHEST  1 VIEW COMPARISON:  09/16/2017 FINDINGS: There remains some minimal lack of re-expansion of the right upper lobe. The more inferior component has shown some reaccumulation of fluid in the interim. Mild basilar atelectasis is noted on the right as well as within the right upper lobe. The left lung remains clear. Cardiac shadow is enlarged.  IMPRESSION: Slight reaccumulation of right  pleural fluid inferiorly. There remains some incomplete re-expansion of the lung following thoracentesis. Continued follow-up is recommended and correlation with patient's clinical symptomatology. Atelectatic changes in the right upper and right lower lobes. Electronically Signed   By: Inez Catalina M.D.   On: 09/17/2017 09:39   Dg Chest 1 View  Result Date: 09/16/2017 CLINICAL DATA:  Status post right thoracentesis. EXAM: CHEST  1 VIEW COMPARISON:  09/16/2017 FINDINGS: Cardiac shadow is enlarged. Pacemaker is again identified. Previously seen large right pleural effusion has resolved following thoracentesis. Small pneumothorax is noted along the apex as well as the right lung base related incomplete re-expansion of the right lung. Left lung remains clear with minimal effusion. No acute bony abnormality is seen. Postsurgical changes of the right clavicle are noted. IMPRESSION: Resolution of previously seen right-sided pleural effusion with incomplete re-expansion of the right lung with changes consistent with small pneumothorax. No chest tube is warranted at this time is the patient is asymptomatic. This will likely re-expand or refill with fluid over time. Follow-up chest x-ray on 09/17/2017 is recommended. Electronically Signed   By: Inez Catalina M.D.   On: 09/16/2017 16:04   US Thoracentesis Asp Pleural Space W/img Guide  Result Date: 09/16/2017 INDICATION: Large right-sided pleural effusion EXAM: ULTRASOUND GUIDED RIGHT THORACENTESIS MEDICATIONS: None. COMPLICATIONS: None immediate. PROCEDURE: An ultrasound guided thoracentesis was thoroughly discussed with the patient and questions answered. The benefits, risks, alternatives and complications were also discussed. The patient understands and wishes to proceed with the procedure. Written consent was obtained. Ultrasound was performed to localize and mark an adequate pocket of fluid in the right chest. The area was then prepped and draped in the normal  sterile fashion. 1% Lidocaine was used for local anesthesia. Under ultrasound guidance a 6 Fr Safe-T-Centesis catheter was introduced. Thoracentesis was performed. Air was noted at the termination of the thoracentesis indicating incomplete re-expansion of the right lung. The catheter was removed and a dressing applied. FINDINGS: A total of approximately 1.9 L of clear yellow fluid was removed. Samples were sent to the laboratory as requested by the clinical team. IMPRESSION: Successful ultrasound guided right thoracentesis yielding 1.9 L of pleural fluid. Air was noted being withdrawn at the end of the thoracentesis likely related incomplete re-expansion of the right lung. This does not represent a true pneumothorax but merely incomplete re-expansion. Follow-up chest x-ray in 1 day is recommended. Electronically Signed   By: Inez Catalina M.D.   On: 09/16/2017 16:08    ASSESSMENT AND PLAN:   Active Problems:   Fall   Recurrent right pleural effusion   * Right Pleural effusion   Thoracentesis -1.9 L fluid removed. Transudate.  Inadequate lung reexpansion and will likely reaccumulate.  Will check chest x-ray in the morning. Appreciate pulmonary input per  *Elevated troponin.  No acute EKG changes.  No complaints of chest pain.  Discussed with Dr. Ubaldo Glassing.  Does not feel this is acute MI.  Likely due to effusion in setting of end-stage renal disease. Patient does have cardiomyopathy with ejection fraction of 20 to 25%  * Fall.  Due to deconditioning.   * ESRD: On hemodialysis Monday Wednesday Friday.   Nephrology on board for dialysis needs  * Cervical fracture: C7. emergency department physician discussed with neurosurgery placement of hard collar if tolerated or soft collar if necessary.  He said to get cervical spine x-ray with flexion and extension but patient is not cooperative. Fracture is stable.  She has no neurologic deficits. She is not very compliant with cervical collar. Family has been  made aware about risks of not wearing collar but patient not compliant.  *  Recent cellulitis on legs     Currently on Omnicef, since discharged last week, continue.  All the records are reviewed and case discussed with Care Management/Social Worker Management plans discussed with the patient, family and they are in agreement.  CODE STATUS: DNR  TOTAL TIME TAKING CARE OF THIS PATIENT: 35 minutes.   POSSIBLE D/C IN 1-2 DAYS, DEPENDING ON CLINICAL CONDITION.  Neita Carp M.D on 09/18/2017   Between 7am to 6pm - Pager - [phone]  After 6pm go to [email or web address] - password [email or web address] [facility]  Office  [phone]  CC: Primary care physician; Kirk Ruths, MD  Note: This dictation was prepared with Dragon dictation along with smaller phrase technology. Any transcriptional errors that result from this process are unintentional.

## 2017-09-18 NOTE — Progress Notes (Signed)
Post dialysis assessment 

## 2017-09-18 NOTE — Progress Notes (Signed)
Hd started  

## 2017-09-19 ENCOUNTER — Inpatient Hospital Stay: Payer: Medicare Other

## 2017-09-19 LAB — BASIC METABOLIC PANEL
Anion gap: 10 (ref 5–15)
BUN: 36 mg/dL — AB (ref 8–23)
CHLORIDE: 97 mmol/L — AB (ref 98–111)
CO2: 32 mmol/L (ref 22–32)
Calcium: 8.4 mg/dL — ABNORMAL LOW (ref 8.9–10.3)
Creatinine, Ser: 3.36 mg/dL — ABNORMAL HIGH (ref 0.44–1.00)
GFR calc Af Amer: 13 mL/min — ABNORMAL LOW (ref >60)
GFR calc non Af Amer: 11 mL/min — ABNORMAL LOW (ref >60)
GLUCOSE: 99 mg/dL (ref 70–99)
POTASSIUM: 4 mmol/L (ref 3.5–5.1)
Sodium: 139 mmol/L (ref 135–145)

## 2017-09-19 LAB — CBC WITH DIFFERENTIAL/PLATELET
Basophils Absolute: 0 10*3/uL (ref 0–0.1)
Basophils Relative: 1 %
Eosinophils Absolute: 0.2 10*3/uL (ref 0–0.7)
Eosinophils Relative: 4 %
HEMATOCRIT: 32.7 % — AB (ref 35.0–47.0)
HEMOGLOBIN: 10.9 g/dL — AB (ref 12.0–16.0)
LYMPHS ABS: 0.7 10*3/uL — AB (ref 1.0–3.6)
LYMPHS PCT: 12 %
MCH: 32.5 pg (ref 26.0–34.0)
MCHC: 33.4 g/dL (ref 32.0–36.0)
MCV: 97.5 fL (ref 80.0–100.0)
Monocytes Absolute: 0.5 10*3/uL (ref 0.2–0.9)
Monocytes Relative: 10 %
NEUTROS PCT: 73 %
Neutro Abs: 4.2 10*3/uL (ref 1.4–6.5)
Platelets: 193 10*3/uL (ref 150–440)
RBC: 3.36 MIL/uL — ABNORMAL LOW (ref 3.80–5.20)
RDW: 16.7 % — ABNORMAL HIGH (ref 11.5–14.5)
WBC: 5.7 10*3/uL (ref 3.6–11.0)

## 2017-09-19 NOTE — Progress Notes (Addendum)
Advance care planning  Patient is confused and unable to participate in medical decision making.  Discussed with her brother/healthcare power of attorney Santina Evans.  Patient has had multiple admissions and slow deterioration over the past few months according to Mr. Dawna Part.  She has had poor oral intake.  Has had situations where comfort care was considered but she has bounced back.  In the past she has mentioned to him that she would like dialysis continued and would like to live.  She is DO NOT RESUSCITATE and DO NOT INTUBATE.  Patient has no children and is widow.  They have 1 more younger sibling but Mr. Dawna Part is the closest to the patient and designated healthcare power of attorney.  She still has her own house.  She was trying to give her stuff away to people understanding that her healthcare situation has deteriorated.  Mr. Dawna Part wonders if hemodialysis has not been as effective recently.  She has had times when she could not tolerate hemodialysis and unable to finish a few sessions.  She has made some recovery is which is why he wants to see if today is 1 of those days where she is more lethargic and will bounce back tomorrow.  Would like to continue hemodialysis schedule at this time.  He tells me he has met with multiple palliative care team members in the past who have are good and helpful and is willing to meet them again tomorrow.  Time spent 50 minutes

## 2017-09-19 NOTE — Progress Notes (Signed)
[place] at [place] [name]    MR#:  [id number]  DATE OF BIRTH:  [birth year]  SUBJECTIVE:  CHIEF COMPLAINT:   Chief Complaint  Patient presents with  . Fall  . Laceration   Sent after a fall.  Noted to have large right-sided pleural effusion and cervical vertebral fracture.   Confused and hard of hearing  REVIEW OF SYSTEMS:   Review of Systems  Unable to perform ROS: Mental status change    DRUG ALLERGIES:   Allergies  Allergen Reactions  . Statins Other (See Comments)    Muscle weakness severe  . Codeine Sulfate Nausea And Vomiting  . Codeine Other (See Comments)    GI UPSET  . Effexor [Venlafaxine] Nausea Only  . Ezetimibe-Simvastatin Other (See Comments)    Muscle weakness    VITALS:  Blood pressure 122/66, pulse 76, temperature (!) 97.4 F (36.3 C), temperature source Oral, resp. rate 18, height 5\' 4"  (1.626 m), weight 46.3 kg, SpO2 91 %.  PHYSICAL EXAMINATION:  GENERAL:  84 y.o.-year-old malnourished patient lying in the bed with no acute distress.  EYES: Pupils equal, round, reactive to light and accommodation. No scleral icterus. Extraocular muscles intact.  HEENT: Head atraumatic, normocephalic. Oropharynx and nasopharynx clear.  NECK:  Supple, no jugular venous distention. No thyroid enlargement, no tenderness.  LUNGS: Normal breath sounds bilaterally, no wheezing, rales,rhonchi or crepitation. No use of accessory muscles of respiration.  CARDIOVASCULAR: S1, S2 normal. No murmurs, rubs, or gallops.  ABDOMEN: Soft, nontender, nondistended. Bowel sounds present. No organomegaly or mass.  EXTREMITIES: No pedal edema, cyanosis, or clubbing.  NEUROLOGIC: No cranial nerve deficit, moves all 4 limbs, follows simple command, generalized weakness. PSYCHIATRIC: The patient is drowsy SKIN: No obvious rash, lesion, or ulcer.   Physical Exam LABORATORY PANEL:   CBC Recent Labs  Lab 09/19/17 0431  WBC 5.7   HGB 10.9*  HCT 32.7*  PLT 193   ------------------------------------------------------------------------------------------------------------------  Chemistries  Recent Labs  Lab 09/16/17 0118  09/19/17 0431  NA 138   < > 139  K 3.5   < > 4.0  CL 99   < > 97*  CO2 27   < > 32  GLUCOSE 88   < > 99  BUN 43*   < > 36*  CREATININE 3.57*   < > 3.36*  CALCIUM 8.4*   < > 8.4*  AST 23  --   --   ALT 16  --   --   ALKPHOS 86  --   --   BILITOT 1.1  --   --    < > = values in this interval not displayed.   ------------------------------------------------------------------------------------------------------------------  Cardiac Enzymes Recent Labs  Lab 09/17/17 0157 09/17/17 0924  TROPONINI 0.76* 1.05*   ------------------------------------------------------------------------------------------------------------------  RADIOLOGY:  Dg Chest 2 View  Result Date: 09/19/2017 CLINICAL DATA:  Right pleural effusion EXAM: CHEST - 2 VIEW COMPARISON:  09/17/2017 chest radiograph. FINDINGS: Stable configuration of 2 lead left subclavian pacemaker. Surgical hardware overlies the lateral right clavicle with healed deformity in the right clavicle. Stable cardiomediastinal silhouette with moderate cardiomegaly. Small right apical pneumothorax is stable. No left pneumothorax. Small bilateral pleural effusions are stable. Mild pulmonary edema appears stable. Lower lung volumes with slightly increased patchy bibasilar lung opacities, right greater than left. IMPRESSION: 1. Stable small right hydropneumothorax. 2. Stable small left pleural effusion. 3. Stable cardiomegaly and mild pulmonary edema. 4. Slightly lower lung volumes. Patchy bibasilar  lung opacities, right greater the left, slightly worsened, favor worsening atelectasis. Electronically Signed   By: Ilona Sorrel M.D.   On: 09/19/2017 08:23    ASSESSMENT AND PLAN:   Active Problems:   Fall   Recurrent right pleural effusion   * Right  Pleural effusion   Thoracentesis -1.9 L fluid removed. Transudate.  Inadequate lung reexpansion and will likely reaccumulate.   Repeat chest x-ray shows reaccumulating pleural effusion Appreciate pulmonary input.  *Elevated troponin.  No acute EKG changes.  No complaints of chest pain.  Discussed with Dr. Ubaldo Glassing.  Does not feel this is acute MI.  Likely due to effusion in setting of end-stage renal disease. Patient does have cardiomyopathy with ejection fraction of 20 to 25%  * Fall.  Due to deconditioning.   * ESRD: On hemodialysis Monday Wednesday Friday.   Nephrology on board for dialysis needs  * Cervical fracture: C7. emergency department physician discussed with neurosurgery placement of hard collar if tolerated or soft collar if necessary.  He said to get cervical spine x-ray with flexion and extension but patient is not cooperative. Fracture is stable.  She has no neurologic deficits. She is not very compliant with cervical collar. Family has been made aware about risks of not wearing collar but patient not compliant.  *  Recent cellulitis on legs     Currently on Omnicef, since discharged last week, continue.  Discussed with her brother and her healthcare power of attorney, Sharon [name] over the phone.  All the records are reviewed and case discussed with Care Management/Social Worker Management plans discussed with the patient, family and they are in agreement.  CODE STATUS: DNR  TOTAL TIME TAKING CARE OF THIS PATIENT: 35 minutes.   POSSIBLE D/C IN 1-2 DAYS, DEPENDING ON CLINICAL CONDITION.  Neita Carp M.D on 09/19/2017   Between 7am to 6pm - Pager - [phone]  After 6pm go to [email or web address] - password [email or web address] [facility]  Office  [phone]  CC: Primary care physician; Kirk Ruths, MD  Note: This dictation was prepared with Dragon dictation along with smaller phrase technology. Any transcriptional errors that result from this  process are unintentional.

## 2017-09-19 NOTE — Progress Notes (Signed)
Patient [name] Date of Encounter: 09/19/2017  Hospital Problem List     Active Problems:   Fall   Recurrent right pleural effusion    Patient Profile     84 year old female with end-stage renal disease status post fall.  Subjective   Somewhat lethargic  Inpatient Medications    . cefdinir  300 mg Oral QODAY  . Chlorhexidine Gluconate Cloth  6 each Topical Q0600  . docusate sodium  100 mg Oral BID  . feeding supplement  1 Container Oral TID BM  . feeding supplement (PRO-STAT SUGAR FREE 64)  30 mL Oral BID  . hydrOXYzine  12.5 mg Oral TID  . latanoprost  1 drop Both Eyes QHS  . levothyroxine  25 mcg Oral QAC breakfast  . lidocaine  1 application Topical Q M,W,F-HD  . metoprolol succinate  25 mg Oral Daily  . multivitamin  1 tablet Oral QHS  . multivitamin-lutein  1 capsule Oral Daily  . sevelamer carbonate  800 mg Oral TID WC  . vitamin C  250 mg Oral BID  . [START ON 09/20/2017] Vitamin D (Ergocalciferol)  50,000 Units Oral Q Mon    Vital Signs    Vitals:   09/18/17 1830 09/18/17 1853 09/18/17 1957 09/19/17 0500  BP: 131/61  130/60 122/66  Pulse: 77  85 76  Resp: 16 18 18 18   Temp: 97.8 F (36.6 C)  (!) 97.4 F (36.3 C)   TempSrc: Axillary  Oral   SpO2:   (!) 79% 91%  Weight:      Height:        Intake/Output Summary (Last 24 hours) at 09/19/2017 1116 Last data filed at 09/18/2017 1957 Gross per 24 hour  Intake 237 ml  Output 500 ml  Net -263 ml   Filed Weights   09/16/17 0109 09/17/17 0422  Weight: 50.7 kg 46.3 kg    Physical Exam    GEN: Well nourished, well developed, in no acute distress.  HEENT: normal.  Neck: Supple, no JVD, carotid bruits, or masses. Cardiac: RRR, no murmurs, rubs, or gallops. No clubbing, cyanosis, edema.  Radials/DP/PT 2+ and equal bilaterally.  Respiratory:  Respirations regular and unlabored, clear to auscultation bilaterally. GI: Soft, nontender, nondistended, BS + x 4. MS: no deformity or  atrophy. Skin: warm and dry, no rash. Neuro:  Strength and sensation are intact. Psych: Normal affect.  Labs    CBC Recent Labs    09/17/17 0924 09/19/17 0431  WBC 6.1 5.7  NEUTROABS  --  4.2  HGB 11.5* 10.9*  HCT 34.8* 32.7*  MCV 97.2 97.5  PLT 218 466   Basic Metabolic Panel Recent Labs    09/17/17 0924 09/18/17 1535 09/19/17 0431  NA 140  --  139  K 4.3  --  4.0  CL 99  --  97*  CO2 27  --  32  GLUCOSE 160*  --  99  BUN 62*  --  36*  CREATININE 5.15*  --  3.36*  CALCIUM 8.8*  --  8.4*  PHOS  --  6.6*  --    Liver Function Tests No results for input(s): AST, ALT, ALKPHOS, BILITOT, PROT, ALBUMIN in the last 72 hours. No results for input(s): LIPASE, AMYLASE in the last 72 hours. Cardiac Enzymes Recent Labs    09/16/17 1651 09/17/17 0157 09/17/17 0924  TROPONINI 0.46* 0.76* 1.05*   BNP No results for input(s): BNP in the last 72 hours. D-Dimer No results for  input(s): DDIMER in the last 72 hours. Hemoglobin A1C No results for input(s): HGBA1C in the last 72 hours. Fasting Lipid Panel No results for input(s): CHOL, HDL, LDLCALC, TRIG, CHOLHDL, LDLDIRECT in the last 72 hours. Thyroid Function Tests No results for input(s): TSH, T4TOTAL, T3FREE, THYROIDAB in the last 72 hours.  Invalid input(s): FREET3  Telemetry    Atrial sensed ventricular paced  ECG    Atrial sensed ventricular paced  Radiology    Dg Chest 1 View  Result Date: 09/17/2017 CLINICAL DATA:  Follow-up incomplete re-expansion of the right lung following thoracentesis. EXAM: CHEST  1 VIEW COMPARISON:  09/16/2017 FINDINGS: There remains some minimal lack of re-expansion of the right upper lobe. The more inferior component has shown some reaccumulation of fluid in the interim. Mild basilar atelectasis is noted on the right as well as within the right upper lobe. The left lung remains clear. Cardiac shadow is enlarged. IMPRESSION: Slight reaccumulation of right pleural fluid inferiorly.  There remains some incomplete re-expansion of the lung following thoracentesis. Continued follow-up is recommended and correlation with patient's clinical symptomatology. Atelectatic changes in the right upper and right lower lobes. Electronically Signed   By: Inez Catalina M.D.   On: 09/17/2017 09:39   Dg Chest 1 View  Result Date: 09/16/2017 CLINICAL DATA:  Status post right thoracentesis. EXAM: CHEST  1 VIEW COMPARISON:  09/16/2017 FINDINGS: Cardiac shadow is enlarged. Pacemaker is again identified. Previously seen large right pleural effusion has resolved following thoracentesis. Small pneumothorax is noted along the apex as well as the right lung base related incomplete re-expansion of the right lung. Left lung remains clear with minimal effusion. No acute bony abnormality is seen. Postsurgical changes of the right clavicle are noted. IMPRESSION: Resolution of previously seen right-sided pleural effusion with incomplete re-expansion of the right lung with changes consistent with small pneumothorax. No chest tube is warranted at this time is the patient is asymptomatic. This will likely re-expand or refill with fluid over time. Follow-up chest x-ray on 09/17/2017 is recommended. Electronically Signed   By: Inez Catalina M.D.   On: 09/16/2017 16:04   Dg Chest 2 View  Result Date: 09/19/2017 CLINICAL DATA:  Right pleural effusion EXAM: CHEST - 2 VIEW COMPARISON:  09/17/2017 chest radiograph. FINDINGS: Stable configuration of 2 lead left subclavian pacemaker. Surgical hardware overlies the lateral right clavicle with healed deformity in the right clavicle. Stable cardiomediastinal silhouette with moderate cardiomegaly. Small right apical pneumothorax is stable. No left pneumothorax. Small bilateral pleural effusions are stable. Mild pulmonary edema appears stable. Lower lung volumes with slightly increased patchy bibasilar lung opacities, right greater than left. IMPRESSION: 1. Stable small right  hydropneumothorax. 2. Stable small left pleural effusion. 3. Stable cardiomegaly and mild pulmonary edema. 4. Slightly lower lung volumes. Patchy bibasilar lung opacities, right greater the left, slightly worsened, favor worsening atelectasis. Electronically Signed   By: Ilona Sorrel M.D.   On: 09/19/2017 08:23   Ct Head Wo Contrast  Result Date: 09/16/2017 CLINICAL DATA:  Fall with right parietal laceration. EXAM: CT HEAD WITHOUT CONTRAST CT CERVICAL SPINE WITHOUT CONTRAST TECHNIQUE: Multidetector CT imaging of the head and cervical spine was performed following the standard protocol without intravenous contrast. Multiplanar CT image reconstructions of the cervical spine were also generated. COMPARISON:  Multiple priors most recently 06/28/2017 FINDINGS: CT HEAD FINDINGS Brain: Unchanged degree of atrophy and chronic small vessel ischemia. No intracranial hemorrhage, mass effect, or midline shift. No hydrocephalus. The basilar cisterns are patent. No evidence  of territorial infarct or acute ischemia. No extra-axial or intracranial fluid collection. Vascular: Atherosclerosis of skullbase vasculature without hyperdense vessel or abnormal calcification. Skull: No fracture or focal lesion. Sinuses/Orbits: Paranasal sinuses and mastoid air cells are clear. The visualized orbits are unremarkable. Bilateral cataract resection. Other: Right frontoparietal scalp hematoma. CT CERVICAL SPINE FINDINGS Alignment: No traumatic subluxation. Degenerative anterolisthesis of C4 on C5, unchanged from prior. Skull base and vertebrae: Comminuted mildly displaced fracture of the spinous process of C7 does not extend to the lamina or canal cortex. No additional acute fracture. Dens and skull base are intact. Soft tissues and spinal canal: No prevertebral fluid or swelling. No visible canal hematoma. Disc levels: Similar degree of degenerative disc disease and facet arthropathy from prior exam. Disc space narrowing and endplate  spurring most prominent at C5-C6 and C6-C7. Upper chest: Large right pleural effusion partially included. Other: Carotid calcifications. IMPRESSION: 1. No acute intracranial abnormality. No skull fracture. Right frontoparietal scalp hematoma. 2. Acute mildly comminuted and displaced C7 spinous process fracture. No additional fracture or subluxation of the cervical spine. 3. Large right pleural effusion, partially included. These results were called by telephone at the time of interpretation on 09/16/2017 at 2:20 am to Dr. Lurline Hare , who verbally acknowledged these results. Electronically Signed   By: Jeb Levering M.D.   On: 09/16/2017 02:20   Ct Cervical Spine Wo Contrast  Result Date: 09/16/2017 CLINICAL DATA:  Fall with right parietal laceration. EXAM: CT HEAD WITHOUT CONTRAST CT CERVICAL SPINE WITHOUT CONTRAST TECHNIQUE: Multidetector CT imaging of the head and cervical spine was performed following the standard protocol without intravenous contrast. Multiplanar CT image reconstructions of the cervical spine were also generated. COMPARISON:  Multiple priors most recently 06/28/2017 FINDINGS: CT HEAD FINDINGS Brain: Unchanged degree of atrophy and chronic small vessel ischemia. No intracranial hemorrhage, mass effect, or midline shift. No hydrocephalus. The basilar cisterns are patent. No evidence of territorial infarct or acute ischemia. No extra-axial or intracranial fluid collection. Vascular: Atherosclerosis of skullbase vasculature without hyperdense vessel or abnormal calcification. Skull: No fracture or focal lesion. Sinuses/Orbits: Paranasal sinuses and mastoid air cells are clear. The visualized orbits are unremarkable. Bilateral cataract resection. Other: Right frontoparietal scalp hematoma. CT CERVICAL SPINE FINDINGS Alignment: No traumatic subluxation. Degenerative anterolisthesis of C4 on C5, unchanged from prior. Skull base and vertebrae: Comminuted mildly displaced fracture of the spinous  process of C7 does not extend to the lamina or canal cortex. No additional acute fracture. Dens and skull base are intact. Soft tissues and spinal canal: No prevertebral fluid or swelling. No visible canal hematoma. Disc levels: Similar degree of degenerative disc disease and facet arthropathy from prior exam. Disc space narrowing and endplate spurring most prominent at C5-C6 and C6-C7. Upper chest: Large right pleural effusion partially included. Other: Carotid calcifications. IMPRESSION: 1. No acute intracranial abnormality. No skull fracture. Right frontoparietal scalp hematoma. 2. Acute mildly comminuted and displaced C7 spinous process fracture. No additional fracture or subluxation of the cervical spine. 3. Large right pleural effusion, partially included. These results were called by telephone at the time of interpretation on 09/16/2017 at 2:20 am to Dr. Lurline Hare , who verbally acknowledged these results. Electronically Signed   By: Jeb Levering M.D.   On: 09/16/2017 02:20   Dg Chest Port 1 View  Result Date: 09/16/2017 CLINICAL DATA:  Fall, head laceration. EXAM: PORTABLE CHEST 1 VIEW COMPARISON:  Chest radiograph June 28, 2017 FINDINGS: Large RIGHT pleural effusion, increased from prior examination. No  mediastinal shift. Persistent underlying consolidation. The cardiac silhouette is at least mildly enlarged, predominantly obscured. Calcified aortic arch. LEFT cardiac pacemaker. No pneumothorax. Osteopenia. RIGHT clavicle ORIF with nonunion. Interval removal of dialysis catheter. IMPRESSION: Large RIGHT pleural effusion increased from prior examination; underlying consolidation. Cardiomegaly. Aortic Atherosclerosis (ICD10-I70.0). Electronically Signed   By: Elon Alas M.D.   On: 09/16/2017 01:48   US Thoracentesis Asp Pleural Space W/img Guide  Result Date: 09/16/2017 INDICATION: Large right-sided pleural effusion EXAM: ULTRASOUND GUIDED RIGHT THORACENTESIS MEDICATIONS: None. COMPLICATIONS:  None immediate. PROCEDURE: An ultrasound guided thoracentesis was thoroughly discussed with the patient and questions answered. The benefits, risks, alternatives and complications were also discussed. The patient understands and wishes to proceed with the procedure. Written consent was obtained. Ultrasound was performed to localize and mark an adequate pocket of fluid in the right chest. The area was then prepped and draped in the normal sterile fashion. 1% Lidocaine was used for local anesthesia. Under ultrasound guidance a 6 Fr Safe-T-Centesis catheter was introduced. Thoracentesis was performed. Air was noted at the termination of the thoracentesis indicating incomplete re-expansion of the right lung. The catheter was removed and a dressing applied. FINDINGS: A total of approximately 1.9 L of clear yellow fluid was removed. Samples were sent to the laboratory as requested by the clinical team. IMPRESSION: Successful ultrasound guided right thoracentesis yielding 1.9 L of pleural fluid. Air was noted being withdrawn at the end of the thoracentesis likely related incomplete re-expansion of the right lung. This does not represent a true pneumothorax but merely incomplete re-expansion. Follow-up chest x-ray in 1 day is recommended. Electronically Signed   By: Inez Catalina M.D.   On: 09/16/2017 16:08    Assessment & Plan    Elevated troponin-likely demand in the face of renal insufficiency.  Does not appear to be a candidate for invasive evaluation.  Continue with hemodialysis and follow.   AV block-atrial paced ventricular sensed.  Pacemaker appears to be functioning normally. Signed, Javier Docker Liliyana Thobe MD 09/19/2017, 11:16 AM  Pager: [phone]

## 2017-09-19 NOTE — Progress Notes (Signed)
[facility]  ROUNDING NOTE   Subjective:  Patient resting comfortably this a.m. Did complete hemodialysis yesterday. Due for dialysis again tomorrow.   Objective:  Vital signs in last 24 hours:  Temp:  [97.4 F (36.3 C)-98 F (36.7 C)] 97.4 F (36.3 C) (08/24 1957) Pulse Rate:  [30-88] 76 (08/25 0500) Resp:  [12-21] 18 (08/25 0500) BP: (110-131)/(49-67) 122/66 (08/25 0500) SpO2:  [79 %-100 %] 91 % (08/25 0500)  Weight change:  Filed Weights   09/16/17 0109 09/17/17 0422  Weight: 50.7 kg 46.3 kg    Intake/Output: I/O last 3 completed shifts: In: 237 [P.O.:237] Out: 500 [Other:500]   Intake/Output this shift:  No intake/output data recorded.  Physical Exam: General: NAD   Head: +hard of hearing, Moist oral mucosal membranes  Eyes: Anicteric   Neck: supple  Lungs:  Clear bilateral, normal effort  Heart: Regular rate and rhythm  Abdomen:  Soft, nontender, BS present  Extremities: trace peripheral edema.   Neurologic: Resting comfortably  Skin: Bilateral UE ecchymoses  Access: Left AVF    Basic Metabolic Panel: Recent Labs  Lab 09/16/17 0118 09/17/17 0924 09/18/17 1535 09/19/17 0431  NA 138 140  --  139  K 3.5 4.3  --  4.0  CL 99 99  --  97*  CO2 27 27  --  32  GLUCOSE 88 160*  --  99  BUN 43* 62*  --  36*  CREATININE 3.57* 5.15*  --  3.36*  CALCIUM 8.4* 8.8*  --  8.4*  PHOS  --   --  6.6*  --     Liver Function Tests: Recent Labs  Lab 09/16/17 0118  AST 23  ALT 16  ALKPHOS 86  BILITOT 1.1  PROT 7.2  ALBUMIN 3.5   No results for input(s): LIPASE, AMYLASE in the last 168 hours. No results for input(s): AMMONIA in the last 168 hours.  CBC: Recent Labs  Lab 09/16/17 0118 09/17/17 0924 09/19/17 0431  WBC 5.3 6.1 5.7  NEUTROABS  --   --  4.2  HGB 11.7* 11.5* 10.9*  HCT 35.6 34.8* 32.7*  MCV 96.8 97.2 97.5  PLT 197 218 193    Cardiac Enzymes: Recent Labs  Lab 09/16/17 0528 09/16/17 1115 09/16/17 1651 09/17/17 0157  09/17/17 0924  TROPONINI 0.11* 0.13* 0.46* 0.76* 1.05*    BNP: Invalid input(s): POCBNP  CBG: No results for input(s): GLUCAP in the last 168 hours.  Microbiology: Results for orders placed or performed during the hospital encounter of 09/16/17  Body fluid culture     Status: None (Preliminary result)   Collection Time: 09/16/17  3:25 PM  Result Value Ref Range Status   Specimen Description   Final    PLEURAL Performed at [facility], [address], [place], [place] [address]    Special Requests   Final    NONE Performed at [facility], [address] [place], [place] [address]    Gram Stain   Final    RARE WBC PRESENT, PREDOMINANTLY MONONUCLEAR NO ORGANISMS SEEN    Culture   Final    NO GROWTH 2 DAYS Performed at [facility], [place] [address], [place], [place] [address]    Report Status PENDING  Incomplete    Coagulation Studies: No results for input(s): LABPROT, INR in the last 72 hours.  Urinalysis: No results for input(s): COLORURINE, LABSPEC, PHURINE, GLUCOSEU, HGBUR, BILIRUBINUR, KETONESUR, PROTEINUR, UROBILINOGEN, NITRITE, LEUKOCYTESUR in the last 72 hours.  Invalid input(s): APPERANCEUR    Imaging: Dg Chest 1 View  Result Date: 09/17/2017 CLINICAL DATA:  Follow-up incomplete re-expansion of the right lung following thoracentesis. EXAM: CHEST  1 VIEW COMPARISON:  09/16/2017 FINDINGS: There remains some minimal lack of re-expansion of the right upper lobe. The more inferior component has shown some reaccumulation of fluid in the interim. Mild basilar atelectasis is noted on the right as well as within the right upper lobe. The left lung remains clear. Cardiac shadow is enlarged. IMPRESSION: Slight reaccumulation of right pleural fluid inferiorly. There remains some incomplete re-expansion of the lung following thoracentesis. Continued follow-up is recommended and correlation with patient's clinical symptomatology. Atelectatic changes  in the right upper and right lower lobes. Electronically Signed   By: Inez Catalina M.D.   On: 09/17/2017 09:39     Medications:    . cefdinir  300 mg Oral QODAY  . Chlorhexidine Gluconate Cloth  6 each Topical Q0600  . docusate sodium  100 mg Oral BID  . feeding supplement  1 Container Oral TID BM  . feeding supplement (PRO-STAT SUGAR FREE 64)  30 mL Oral BID  . hydrOXYzine  12.5 mg Oral TID  . latanoprost  1 drop Both Eyes QHS  . levothyroxine  25 mcg Oral QAC breakfast  . lidocaine  1 application Topical Q M,W,F-HD  . metoprolol succinate  25 mg Oral Daily  . multivitamin  1 tablet Oral QHS  . multivitamin-lutein  1 capsule Oral Daily  . sevelamer carbonate  800 mg Oral TID WC  . vitamin C  250 mg Oral BID  . [START ON 09/20/2017] Vitamin D (Ergocalciferol)  50,000 Units Oral Q Mon   acetaminophen **OR** acetaminophen, guaiFENesin-dextromethorphan, hydrocortisone cream, LORazepam, ondansetron **OR** ondansetron (ZOFRAN) IV, traMADol  Assessment/ Plan:  [name] is a 84 y.o. white female with end stage renal disease on hemodialysis, coronary artery disease, pacemaker placement, meniere's disease, congestive heart failure, peripheral vascular disease, multiple falls and fractures.   MWF [facility] permcath/left AVF EDW 46.5kg.   1. End-stage renal disease:  Patient off of her dialysis schedule at the moment.  She did undergo hemodialysis yesterday.  Her next treatment will be tomorrow.  2. Anemia of chronic kidney disease: Hemoglobin down slightly to 10.9.  Consider restarting the patient on low-dose Epogen tomorrow.  3. Secondary hyperparathyroidism with hyperphosphatemia: Phosphorus was a bit high yesterday at 6.6.  Maintain the patient on Renvela 800 mg p.o. 3 times daily and recheck serum phosphorus tomorrow.  Her PTH was also out of control as an outpatient.  Awaiting repeat PTH check.   LOS: 1 Sharon [name] 8/25/20198:11 AM

## 2017-09-20 LAB — COMP PANEL: LEUKEMIA/LYMPHOMA

## 2017-09-20 LAB — PHOSPHORUS: PHOSPHORUS: 9.1 mg/dL — AB (ref 2.5–4.6)

## 2017-09-20 LAB — BODY FLUID CULTURE: CULTURE: NO GROWTH

## 2017-09-20 LAB — PARATHYROID HORMONE, INTACT (NO CA): PTH: 268 pg/mL — ABNORMAL HIGH (ref 15–65)

## 2017-09-20 LAB — CYTOLOGY - NON PAP

## 2017-09-20 NOTE — Progress Notes (Signed)
HD Treatment Initiated    09/20/17 0950  Vital Signs  Pulse Rate 88  Resp 13  Oxygen Therapy  SpO2 95 %  During Hemodialysis Assessment  Blood Flow Rate (mL/min) 400 mL/min  Arterial Pressure (mmHg) -150 mmHg  Venous Pressure (mmHg) 170 mmHg  Transmembrane Pressure (mmHg) 60 mmHg  Ultrafiltration Rate (mL/min) 430 mL/min  Dialysate Flow Rate (mL/min) 800 ml/min  Conductivity: Machine  13.9  HD Safety Checks Performed Yes  Dialysis Fluid Bolus Normal Saline  Bolus Amount (mL) 250 mL  Intra-Hemodialysis Comments Tx initiated  Fistula / Graft Right Forearm Arteriovenous fistula  Placement Date/Time: 04/15/16 1341   Placed prior to admission: No  Orientation: Right  Access Location: Forearm  Access Type: Arteriovenous fistula  Status Accessed  Needle Size 15

## 2017-09-20 NOTE — Progress Notes (Signed)
HD Treatment Complete    09/20/17 1337  Vital Signs  Pulse Rate 86  Resp 17  BP (!) 104/55  Oxygen Therapy  SpO2 96 %  During Hemodialysis Assessment  Intra-Hemodialysis Comments Tx completed (UF 1533)  Fistula / Graft Right Forearm Arteriovenous fistula  Placement Date/Time: 04/15/16 1341   Placed prior to admission: No  Orientation: Right  Access Location: Forearm  Access Type: Arteriovenous fistula  Status Deaccessed

## 2017-09-20 NOTE — Progress Notes (Signed)
Pt woke up with nausea and extreme anxiety.  Gave zofran and ativan IV with relief. Dorna Bloom RN

## 2017-09-20 NOTE — Progress Notes (Signed)
Post HD Assessment    09/20/17 1345  Neurological  Level of Consciousness Responds to Pain  Orientation Level  (unable to assess at this time)  Respiratory  Respiratory Pattern Dyspnea with exertion  Chest Assessment Chest expansion symmetrical  Bilateral Breath Sounds Diminished  Cardiac  Pulse Regular  Cardiac Rhythm Ventricular paced  Antiarrhythmic device  Antiarrhythmic device Permanent Pacemaker  Vascular  R Radial Pulse +2  L Radial Pulse +2  RLE Edema Non-pitting  LLE Edema Non-Pitting  Integumentary  Integumentary (WDL) X  Skin Color Cyanosis;Red  Skin Condition Dry  Musculoskeletal  Musculoskeletal (WDL) X  Generalized Weakness Yes  GU Assessment  Genitourinary (WDL) X (HD patient)  Psychosocial  Psychosocial (WDL) WDL

## 2017-09-20 NOTE — Progress Notes (Signed)
HD Pre Assessment    09/20/17 0930  Neurological  Level of Consciousness Responds to Pain  Orientation Level Other (comment) (unable to assess at this time)  Respiratory  Respiratory Pattern Dyspnea with exertion;Symmetrical  Chest Assessment Chest expansion symmetrical  Bilateral Breath Sounds Diminished  Cardiac  Pulse Regular  Heart Sounds S1, S2  Jugular Venous Distention (JVD) No  ECG Monitor Yes  Cardiac Rhythm Ventricular paced  Antiarrhythmic device  Antiarrhythmic device Permanent Pacemaker  Vascular  R Radial Pulse +2  L Radial Pulse +2  RLE Edema Non-pitting  LLE Edema Non-Pitting  Integumentary  Integumentary (WDL) X  Skin Color Cyanosis;Red  Skin Condition Dry  Musculoskeletal  Musculoskeletal (WDL) X  Generalized Weakness Yes  GU Assessment  Genitourinary (WDL) X (HD pt)  Psychosocial  Psychosocial (WDL) WDL

## 2017-09-20 NOTE — Progress Notes (Signed)
OT Cancellation Note  Patient Details [name] MRN: [id number] DOB: 05/11/1926   Cancelled Treatment:    Reason Eval/Treat Not Completed: Patient at procedure or test/ unavailable.  Spoke to [name] about status since she needed IV Ativan at 1:19am due to anxiety and pt is in dialysis.  Will attempt again later today.    Chrys Racer, OTR/L ascom [phone] 09/20/17, 9:29 AM

## 2017-09-20 NOTE — Progress Notes (Signed)
[facility] [place] [name]    MR#:  [id number]  DATE OF BIRTH:  1926-05-12  SUBJECTIVE:  CHIEF COMPLAINT:   Chief Complaint  Patient presents with  . Fall  . Laceration   Sent after a fall.  Noted to have large right-sided pleural effusion and cervical vertebral fracture.   Lethargic. Responsive to painful stimuli. Had HD earlier  REVIEW OF SYSTEMS:   Review of Systems  Unable to perform ROS: Mental status change    DRUG ALLERGIES:   Allergies  Allergen Reactions  . Statins Other (See Comments)    Muscle weakness severe  . Codeine Sulfate Nausea And Vomiting  . Codeine Other (See Comments)    GI UPSET  . Effexor [Venlafaxine] Nausea Only  . Ezetimibe-Simvastatin Other (See Comments)    Muscle weakness    VITALS:  Blood pressure (!) 107/54, pulse 84, temperature 98.1 F (36.7 C), temperature source Oral, resp. rate 14, height 5\' 4"  (1.626 m), weight 53.5 kg, SpO2 94 %.  PHYSICAL EXAMINATION:  GENERAL:  84 y.o.-year-old malnourished patient lying in the bed with no acute distress.  EYES: Pupils equal, round, reactive to light and accommodation. No scleral icterus. Extraocular muscles intact.  HEENT: Head atraumatic, normocephalic. Oropharynx and nasopharynx clear.  NECK:  Supple, no jugular venous distention. No thyroid enlargement, no tenderness.  LUNGS: Normal breath sounds bilaterally, no wheezing, rales,rhonchi or crepitation. No use of accessory muscles of respiration.  CARDIOVASCULAR: S1, S2 normal. No murmurs, rubs, or gallops.  ABDOMEN: Soft, nontender, nondistended. Bowel sounds present. No organomegaly or mass.  EXTREMITIES: No pedal edema, cyanosis, or clubbing.  NEUROLOGIC: Not following instructions PSYCHIATRIC: The patient is drowsy SKIN: No obvious rash, lesion, or ulcer.   Physical Exam LABORATORY PANEL:   CBC Recent Labs  Lab 09/19/17 0431  WBC 5.7  HGB 10.9*  HCT 32.7*  PLT 193    ------------------------------------------------------------------------------------------------------------------  Chemistries  Recent Labs  Lab 09/16/17 0118  09/19/17 0431  NA 138   < > 139  K 3.5   < > 4.0  CL 99   < > 97*  CO2 27   < > 32  GLUCOSE 88   < > 99  BUN 43*   < > 36*  CREATININE 3.57*   < > 3.36*  CALCIUM 8.4*   < > 8.4*  AST 23  --   --   ALT 16  --   --   ALKPHOS 86  --   --   BILITOT 1.1  --   --    < > = values in this interval not displayed.   ------------------------------------------------------------------------------------------------------------------  Cardiac Enzymes Recent Labs  Lab 09/17/17 0157 09/17/17 0924  TROPONINI 0.76* 1.05*   ------------------------------------------------------------------------------------------------------------------  RADIOLOGY:  Dg Chest 2 View  Result Date: 09/19/2017 CLINICAL DATA:  Right pleural effusion EXAM: CHEST - 2 VIEW COMPARISON:  09/17/2017 chest radiograph. FINDINGS: Stable configuration of 2 lead left subclavian pacemaker. Surgical hardware overlies the lateral right clavicle with healed deformity in the right clavicle. Stable cardiomediastinal silhouette with moderate cardiomegaly. Small right apical pneumothorax is stable. No left pneumothorax. Small bilateral pleural effusions are stable. Mild pulmonary edema appears stable. Lower lung volumes with slightly increased patchy bibasilar lung opacities, right greater than left. IMPRESSION: 1. Stable small right hydropneumothorax. 2. Stable small left pleural effusion. 3. Stable cardiomegaly and mild pulmonary edema. 4. Slightly lower lung volumes. Patchy bibasilar lung opacities, right greater the left, slightly  worsened, favor worsening atelectasis. Electronically Signed   By: Ilona Sorrel M.D.   On: 09/19/2017 08:23    ASSESSMENT AND PLAN:   Active Problems:   Fall   Recurrent right pleural effusion   * Acute metabolic encephalopathy with  worsening dementia No improvement Will benefit from hospice.  * Right Pleural effusion   Thoracentesis -1.9 L fluid removed. Transudate.  Inadequate lung reexpansion and will likely reaccumulate.   Repeat chest x-ray shows slowly reaccumulating pleural effusion Appreciate pulmonary input.  *Elevated troponin.  No acute EKG changes.  No complaints of chest pain.  Discussed with Dr. Ubaldo Glassing.  Does not feel this is acute MI.  Likely due to effusion in setting of end-stage renal disease. Patient does have cardiomyopathy with ejection fraction of 20 to 25%  * Fall.  Due to deconditioning.   * ESRD: On hemodialysis Monday Wednesday Friday.   Nephrology on board for dialysis needs  * Cervical fracture: C7. emergency department physician discussed with neurosurgery placement of hard collar if tolerated or soft collar if necessary.  He said to get cervical spine x-ray with flexion and extension but patient is not cooperative. Fracture is stable.  She has no neurologic deficits. She is not very compliant with cervical collar. Family has been made aware about risks of not wearing collar but patient not compliant.  *  Recent cellulitis on legs     Currently on Omnicef, since discharged last week, continue.  Discussed with her brother and her healthcare power of attorney, Santina Evans over the phone.  All the records are reviewed and case discussed with Care Management/Social Worker Management plans discussed with the patient, family and they are in agreement.  CODE STATUS: DNR  TOTAL TIME TAKING CARE OF THIS PATIENT: 35 minutes.   POSSIBLE D/C IN 1-2 DAYS, DEPENDING ON CLINICAL CONDITION.  Neita Carp M.D on 09/20/2017   Between 7am to 6pm - Pager - [phone]  After 6pm go to [email or web address] - password [email or web address] [facility]  Office  [phone]  CC: Primary care physician; Kirk Ruths, MD  Note: This dictation was prepared with Dragon dictation along  with smaller phrase technology. Any transcriptional errors that result from this process are unintentional.

## 2017-09-20 NOTE — Progress Notes (Addendum)
Pt bathed and skin cleaned, foam dressings changed on right shoulder, right knee, right shin x2, sacral foam. Dorna Bloom RN

## 2017-09-20 NOTE — Progress Notes (Signed)
PMT consult received, chart reviewed, and discussed with Dr. Darvin Neighbours. Patient in dialysis during my first visit. Spoke with brother, Ed via telephone. He will not be at the hospital this afternoon but plans to meet with me in the AM to further discuss [place].   Shortly after, patient back from HD and assessment completed. Patient is only responsive to painful stimuli. Non-verbal and not following commands. No s/s of acute distress.   Called Ed back to update him on mental status this afternoon. Ed is not home but message left with his wife, who tells me her mental status was that way yesterday morning. Ed's wife will give him the message and still plans to meet with me in AM.   Patient is a DNR.   NO CHARGE  Ihor Dow, FNP-C Palliative Medicine Team  Phone: [phone] Fax: [phone]

## 2017-09-20 NOTE — Progress Notes (Signed)
Post HD Treatment    09/20/17 1345  Vital Signs  Temp 97.6 F (36.4 C)  Temp Source Axillary  Pulse Rate 86  Pulse Rate Source Monitor  Resp 13  BP (!) 101/53  BP Location Left Arm  BP Method Automatic  Patient Position (if appropriate) Lying  Oxygen Therapy  SpO2 97 %  O2 Device Nasal Cannula  O2 Flow Rate (L/min) 6 L/min  Dialysis Weight  Weight 53.5 kg  Type of Weight Post-Dialysis  Post-Hemodialysis Assessment  Rinseback Volume (mL) 250 mL  KECN 78.5 V  Dialyzer Clearance Lightly streaked  Duration of HD Treatment -hour(s) 3.5 hour(s)  Hemodialysis Intake (mL) 500 mL  UF Total -Machine (mL) 1533 mL  Net UF (mL) 1033 mL  Tolerated HD Treatment Yes  AVG/AVF Arterial Site Held (minutes) 10 minutes  AVG/AVF Venous Site Held (minutes) 10 minutes  Fistula / Graft Right Forearm Arteriovenous fistula  Placement Date/Time: 04/15/16 1341   Placed prior to admission: No  Orientation: Right  Access Location: Forearm  Access Type: Arteriovenous fistula  Site Condition No complications  Fistula / Graft Assessment Present;Thrill;Bruit  Drainage Description None

## 2017-09-20 NOTE — Progress Notes (Signed)
[facility]  ROUNDING NOTE   Subjective:   Seen and examined on hemodialysis. Tolerating treatment well.   Given lorazepam at 1:19 am. Patient is minimally responsive.     HEMODIALYSIS FLOWSHEET:  Blood Flow Rate (mL/min): 400 mL/min Arterial Pressure (mmHg): -220 mmHg Venous Pressure (mmHg): 170 mmHg Transmembrane Pressure (mmHg): 50 mmHg Ultrafiltration Rate (mL/min): 430 mL/min Dialysate Flow Rate (mL/min): 800 ml/min Conductivity: Machine : 13.9 Conductivity: Machine : 13.9 Dialysis Fluid Bolus: Normal Saline Bolus Amount (mL): 250 mL    Objective:  Vital signs in last 24 hours:  Temp:  [97.4 F (36.3 C)-97.6 F (36.4 C)] 97.5 F (36.4 C) (08/26 0930) Pulse Rate:  [77-89] 87 (08/26 1136) Resp:  [10-25] 13 (08/26 1136) BP: (98-133)/(48-91) 102/51 (08/26 1136) SpO2:  [85 %-99 %] 95 % (08/26 1136) Weight:  [58.5 kg] 58.5 kg (08/26 0930)  Weight change:  Filed Weights   09/16/17 0109 09/17/17 0422 09/20/17 0930  Weight: 50.7 kg 46.3 kg 58.5 kg    Intake/Output: I/O last 3 completed shifts: In: 237 [P.O.:237] Out: -    Intake/Output this shift:  No intake/output data recorded.  Physical Exam: General: NAD, laying in bed  Head: Normocephalic, atraumatic. Moist oral mucosal membranes  Eyes: Anicteric, PERRL  Neck: Supple, trachea midline  Lungs:  Clear to auscultation  Heart: Regular rate and rhythm  Abdomen:  Soft, nontender,   Extremities: no peripheral edema.  Neurologic: Nonfocal, moving all four extremities  Skin: No lesions  Access: Right AVF    Basic Metabolic Panel: Recent Labs  Lab 09/16/17 0118 09/17/17 0924 09/18/17 1535 09/19/17 0431 09/20/17 1017  NA 138 140  --  139  --   K 3.5 4.3  --  4.0  --   CL 99 99  --  97*  --   CO2 27 27  --  32  --   GLUCOSE 88 160*  --  99  --   BUN 43* 62*  --  36*  --   CREATININE 3.57* 5.15*  --  3.36*  --   CALCIUM 8.4* 8.8*  --  8.4*  --   PHOS  --   --  6.6*  --  9.1*     Liver Function Tests: Recent Labs  Lab 09/16/17 0118  AST 23  ALT 16  ALKPHOS 86  BILITOT 1.1  PROT 7.2  ALBUMIN 3.5   No results for input(s): LIPASE, AMYLASE in the last 168 hours. No results for input(s): AMMONIA in the last 168 hours.  CBC: Recent Labs  Lab 09/16/17 0118 09/17/17 0924 09/19/17 0431  WBC 5.3 6.1 5.7  NEUTROABS  --   --  4.2  HGB 11.7* 11.5* 10.9*  HCT 35.6 34.8* 32.7*  MCV 96.8 97.2 97.5  PLT 197 218 193    Cardiac Enzymes: Recent Labs  Lab 09/16/17 0528 09/16/17 1115 09/16/17 1651 09/17/17 0157 09/17/17 0924  TROPONINI 0.11* 0.13* 0.46* 0.76* 1.05*    BNP: Invalid input(s): POCBNP  CBG: No results for input(s): GLUCAP in the last 168 hours.  Microbiology: Results for orders placed or performed during the hospital encounter of 09/16/17  Body fluid culture     Status: None   Collection Time: 09/16/17  3:25 PM  Result Value Ref Range Status   Specimen Description   Final    PLEURAL Performed at [facility], [address], [place], [place] [address]    Special Requests   Final    NONE Performed at  [place], [place] [address]    Gram Stain   Final    RARE WBC PRESENT, PREDOMINANTLY MONONUCLEAR NO ORGANISMS SEEN    Culture   Final    NO GROWTH 3 DAYS Performed at [facility] [address] [place], [place] [address]    Report Status 09/20/2017 FINAL  Final    Coagulation Studies: No results for input(s): LABPROT, INR in the last 72 hours.  Urinalysis: No results for input(s): COLORURINE, LABSPEC, PHURINE, GLUCOSEU, HGBUR, BILIRUBINUR, KETONESUR, PROTEINUR, UROBILINOGEN, NITRITE, LEUKOCYTESUR in the last 72 hours.  Invalid input(s): APPERANCEUR    Imaging: Dg Chest 2 View  Result Date: 09/19/2017 CLINICAL DATA:  Right pleural effusion EXAM: CHEST - 2 VIEW COMPARISON:  09/17/2017 chest radiograph. FINDINGS: Stable configuration of 2 lead left subclavian  pacemaker. Surgical hardware overlies the lateral right clavicle with healed deformity in the right clavicle. Stable cardiomediastinal silhouette with moderate cardiomegaly. Small right apical pneumothorax is stable. No left pneumothorax. Small bilateral pleural effusions are stable. Mild pulmonary edema appears stable. Lower lung volumes with slightly increased patchy bibasilar lung opacities, right greater than left. IMPRESSION: 1. Stable small right hydropneumothorax. 2. Stable small left pleural effusion. 3. Stable cardiomegaly and mild pulmonary edema. 4. Slightly lower lung volumes. Patchy bibasilar lung opacities, right greater the left, slightly worsened, favor worsening atelectasis. Electronically Signed   By: Ilona Sorrel M.D.   On: 09/19/2017 08:23     Medications:    . cefdinir  300 mg Oral QODAY  . Chlorhexidine Gluconate Cloth  6 each Topical Q0600  . docusate sodium  100 mg Oral BID  . feeding supplement  1 Container Oral TID BM  . feeding supplement (PRO-STAT SUGAR FREE 64)  30 mL Oral BID  . hydrOXYzine  12.5 mg Oral TID  . latanoprost  1 drop Both Eyes QHS  . levothyroxine  25 mcg Oral QAC breakfast  . lidocaine  1 application Topical Q M,W,F-HD  . metoprolol succinate  25 mg Oral Daily  . multivitamin  1 tablet Oral QHS  . multivitamin-lutein  1 capsule Oral Daily  . sevelamer carbonate  800 mg Oral TID WC  . vitamin C  250 mg Oral BID  . Vitamin D (Ergocalciferol)  50,000 Units Oral Q Mon   acetaminophen **OR** acetaminophen, guaiFENesin-dextromethorphan, hydrocortisone cream, LORazepam, ondansetron **OR** ondansetron (ZOFRAN) IV, traMADol  Assessment/ Plan:  Sharon [name] is a 84 y.o. white female with end stage renal disease on hemodialysis, coronary artery disease, pacemaker, Meniere's, congestive heart failure, peripheral vascular disease  MWF [facility] permcath/left AVF EDW 46.5kg.   1. End-stage renal disease:Seen and examined on  hemodialysis treatment. Tolerating treatment well. UF of 1 liter goal.   2. Anemia of chronic kidney disease: Hemoglobin 10.9.  - EPO as outpatient  3. Secondary hyperparathyroidism with hyperphosphatemia: PTH 788 on 8/16 - Renvela with meals.  4. Hypertension: 120/62 - at goal.  - metoprolol    LOS: 2 Evalene Vath 8/26/201911:48 AM

## 2017-09-20 NOTE — Progress Notes (Signed)
PT Cancellation Note  Patient Details [name] MRN: [id number] DOB: [birth year]   Cancelled Treatment:    Reason Eval/Treat Not Completed: Patient at procedure or test/unavailable. Pt currently out of the room for hemodialysis. Re attempt at a later time/date, as the schedule allows.    Larae Grooms, PTA 09/20/2017, 11:53 AM

## 2017-09-20 NOTE — Progress Notes (Signed)
Pre HD Treatment    09/20/17 0930  Vital Signs  Resp (!) 25  BP (!) 111/55  BP Location Left Arm  BP Method Automatic  Patient Position (if appropriate) Lying  Oxygen Therapy  SpO2 99 %  O2 Device Nasal Cannula  O2 Flow Rate (L/min) 6 L/min  Pain Assessment  Pain Scale 0-10  Pain Score Asleep  Dialysis Weight  Weight 58.5 kg  Type of Weight Pre-Dialysis  Time-Out for Hemodialysis  What Procedure? HD  Pt Identifiers(min of two) First/Last Name;MRN/Account#;Pt's DOB(use if MRN/Acct# not available  Correct Site? Yes  Correct Side? Yes  Correct Procedure? Yes  Consents Verified? Yes  Rad Studies Available? N/A  Safety Precautions Reviewed? Yes  Engineer, civil (consulting) Number [id number]  Station Number 4  UF/Alarm Test Passed  Conductivity: Meter 14  Conductivity: Machine  143.9  pH 7.4  Reverse Osmosis Main  Normal Saline Lot Number [id number]  Dialyzer Lot Number [id number]  Disposable Set Lot Number [id number]  Machine Temperature 98.6 F (37 C)  Musician and Audible Yes  Blood Lines Intact and Secured Yes  Pre Treatment Patient Checks  Vascular access used during treatment Fistula  Hepatitis B Surface Antigen Results Negative  Date Hepatitis B Surface Antigen Drawn 09/09/17  Isolation Initiated Yes  Hepatitis B Surface Antibody  (>10)  Date Hepatitis B Surface Antibody Drawn 09/10/17  Hemodialysis Consent Verified Yes  Hemodialysis Standing Orders Initiated Yes  ECG (Telemetry) Monitor On Yes  Prime Ordered Normal Saline  Length of  DialysisTreatment -hour(s) 3.5 Hour(s)  Dialyzer Elisio 17H NR  Dialysate 3K, 2.5 Ca  Dialysis Anticoagulant None  Dialysate Flow Ordered 800  Blood Flow Rate Ordered 400 mL/min  Ultrafiltration Goal 1 Liters  Pre Treatment Labs Phosphorus  Dialysis Blood Pressure Support Ordered Normal Saline  Education / Care Plan  Dialysis Education Provided Yes  Documented Education in Care Plan Yes  Fistula / Graft Right Forearm  Arteriovenous fistula  Placement Date/Time: 04/15/16 1341   Placed prior to admission: No  Orientation: Right  Access Location: Forearm  Access Type: Arteriovenous fistula  Site Condition No complications  Fistula / Graft Assessment Present;Thrill;Bruit  Drainage Description None

## 2017-09-21 ENCOUNTER — Ambulatory Visit: Payer: Medicare Other | Admitting: Internal Medicine

## 2017-09-21 DIAGNOSIS — R4182 Altered mental status, unspecified: Secondary | ICD-10-CM

## 2017-09-21 DIAGNOSIS — Z992 Dependence on renal dialysis: Secondary | ICD-10-CM

## 2017-09-21 DIAGNOSIS — Z515 Encounter for palliative care: Secondary | ICD-10-CM

## 2017-09-21 DIAGNOSIS — S129XXA Fracture of neck, unspecified, initial encounter: Secondary | ICD-10-CM

## 2017-09-21 DIAGNOSIS — Z7189 Other specified counseling: Secondary | ICD-10-CM

## 2017-09-21 DIAGNOSIS — N186 End stage renal disease: Secondary | ICD-10-CM

## 2017-09-21 MED ORDER — SODIUM CHLORIDE 0.9 % IV SOLN
Freq: Two times a day (BID) | INTRAVENOUS | Status: DC
  Administered 2017-09-21 – 2017-09-22 (×2): via INTRAVENOUS
  Filled 2017-09-21 (×5): qty 1

## 2017-09-21 MED ORDER — ASCORBIC ACID 500 MG/ML IJ SOLN: 500.0000 mg | Freq: Two times a day (BID) | INTRAMUSCULAR | Status: DC

## 2017-09-21 NOTE — Progress Notes (Signed)
OT Cancellation Note  Patient Details [name] MRN: [id number] DOB: 18-Jul-1926   Cancelled Treatment:    Reason Eval/Treat Not Completed: Fatigue/lethargy limiting ability to participate(Pt. moaning. Limited ability to follow commands/directions. Will reattempt initial OT visit at a later time. )  Harrel Carina, MS, OTR/L 09/21/2017, 9:04 AM

## 2017-09-21 NOTE — Consult Note (Signed)
Consultation Note Date: 09/21/2017   Patient [name]  DOB: 1926/06/17  MRN: [id number]  Age / Sex: 84 y.o., female  PCP: Kirk Ruths, MD Referring Physician: [name], MD  Reason for Consultation: Establishing goals of care  HPI/Patient Profile: 84 y.o. female  with past medical history of ESRD on hemodialysis, CAD, MI, pacemaker, DVT, CHF EF 20-25% admitted on 09/16/2017 from SNF after fall. Found to have a C7 fracture with neurosurgery recommendation for hard or soft collar if necessary. Nephrology following for ESRD. Recurrent right pleural effusion s/p 1.9L removed by thoracentesis. Repeat chest xray shows reoccurring pleural effusion. Patient with continued acute metabolic encephalopathy with worsening dementia. Palliative medicine consultation for repeat goals of care.   Clinical Assessment and Goals of Care:  I have reviewed medical records, discussed with care team, and met with brother/HCPOA (Ed) in family waiting room to discuss diagnosis, prognosis, GOC, EOL wishes, disposition and options. Patient well known to PMT from recent admissions. Ed has spoke with and appreciates ongoing conversations with palliative.   Therapeutic listening as Ed recalls many events in the last few months-years related to her health decline. Prior to this admission, living at [facility]. She has been tolerating dialysis well. She has recurrent falls and ongoing confusion. She has continuously asked to return, also understanding this is not a safe option.   Reviewed hospital diagnoses and interventions in detail. Ed does have a good understanding with how sick she is and that these problems are not fixable. We discussed her waxing/waning mental status. We discussed high risk for recurrent hospitalization and her possibly being at a point where she wont be able to get into wheelchair and continue  to receive outpatient dialysis from SNF. Ed speaks of staff at the facility doing a great job to get her ready and transferred over to [facility].   I attempted to elicit values and goals of care important to the patient and family. Advanced directives, concepts specific to code status, artifical feeding and hydration, and rehospitalization were considered and discussed. Ed speaks of Ms. Botello enjoying dialysis. She has always been firm with him that she will "make the decision" when she feels tired or ready to stop dialysis. She has told him not to "pull the plug" on her because this is her decision to make. Ms. Savino has always understood that dialysis is a life-prolonging measure and when she stops, time will be short.   Ed does confirm his and [name] conversations regarding heroic interventions and confirms DNR/DNI.   Ed recalls a conversation with her this past Sunday, which was her birthday. He speaks of her being lucid in the evening. She told him she was NOT ready to stop dialysis. We again discussed her ongoing confusion and change in mental status, and my fear that he will eventually have to make this decision as her HCPOA. Ed understands this but is not ready to discontinue dialysis after our discussion.   Ed understands she cannot receive hospice services with continued dialysis.  He does understand that she will be eligible for hospice facility once the decision has been made to stop dialysis or she can no longer tolerate dialysis.   I attempted to explore her fears regarding EOL. Ed tells me she is "afraid of dying." He shares that she has multiple belongings at the house that she wishes to give to family. Encouraged Ed to explore possible thoughts and fears regarding EOL during her lucid moments.   Again, therapeutic listening as Ed shares stories of Lu. [name] good friend Gay Filler stopped in during our conversation and speaks of this being the "worst" she has ever been. Ed understand he is faced  with big decisions.   Questions and concerns were addressed. Emotional/spiritual support provided.    SUMMARY OF RECOMMENDATIONS    DNR/DNI  Continue current interventions and watchful waiting.   Ed is not ready to make the decision to discontinue dialysis. Lu has told him in the past not to "pull the plug" and that this was her decision to make. Ed understands he is faced with big decisions as her HCPOA and this ultimately may come down to him with her changing mental status.   Patient followed by outpatient palliative at [facility].  PMT will follow inpatient.   Code Status/Advance Care Planning:  DNR  Symptom Management:   Per attending  Palliative Prophylaxis:   Aspiration, Delirium Protocol, Oral Care and Turn Reposition  Psycho-social/Spiritual:   Desire for further Chaplaincy support:yes  Additional Recommendations: Caregiving  Support/Resources, Compassionate Wean Education and Education on Hospice  Prognosis:   Unable to determine: poor prognosis  Discharge Planning: [place] for rehab with Palliative care service follow-up      Primary Diagnoses: Present on Admission: . Fall . Recurrent right pleural effusion   I have reviewed the medical record, interviewed the patient and family, and examined the patient. The following aspects are pertinent.  Past Medical History:  Diagnosis Date  . Anemia   . Anxiety   . Arthritis   . CAD (coronary artery disease)   . Cancer ([place])    skin  . Cardiomyopathy ([place])   . CHF (congestive heart failure) ([place])   . Depression   . IBS (irritable bowel syndrome) 2010  . Kidney failure July 2012   Hemodialysis 3xweek  . Kidney failure   . Meniere disease   . Meniere's disease   . Myocardial infarction ([place])   . OSA (obstructive sleep apnea)    CPAP  . Peritoneal dialysis status ([place])   . Presence of IVC filter 2019  . Presence of permanent cardiac pacemaker   . Renal insufficiency     Social History   Socioeconomic History  . Marital status: Widowed    Spouse name: Not on file  . Number of children: 0  . Years of education: Not on file  . Highest education level: Not on file  Occupational History  . Occupation: Retired   Scientific laboratory technician  . Financial resource strain: Not on file  . Food insecurity:    Worry: Patient refused    Inability: Patient refused  . Transportation needs:    Medical: Patient refused    Non-medical: Patient refused  Tobacco Use  . Smoking status: Former Smoker    Last attempt to quit: 03/31/1948    Years since quitting: 69.5  . Smokeless tobacco: Never Used  Substance and Sexual Activity  . Alcohol use: No  . Drug use: No  . Sexual activity: Never  Lifestyle  .  Physical activity:    Days per week: Patient refused    Minutes per session: Patient refused  . Stress: Patient refused  Relationships  . Social connections:    Talks on phone: Patient refused    Gets together: Patient refused    Attends religious service: Patient refused    Active member of club or organization: Patient refused    Attends meetings of clubs or organizations: Patient refused    Relationship status: Patient refused  Other Topics Concern  . Not on file  Social History Narrative   Lives in [place] alone. Widow 2012.      Regular Exercise -  Housework   Daily Caffeine Use:  1 - 2 cups coffee            Family History  Problem Relation Age of Onset  . Cancer Mother        ? ovarian - sarcoma  . Cancer Father        Skin cancer  . Diabetes Sister   . COPD Sister   . Depression Sister   . Cancer Sister        Lung - 9 yrs old   Scheduled Meds: . Chlorhexidine Gluconate Cloth  6 each Topical Q0600  . docusate sodium  100 mg Oral BID  . feeding supplement  1 Container Oral TID BM  . feeding supplement (PRO-STAT SUGAR FREE 64)  30 mL Oral BID  . hydrOXYzine  12.5 mg Oral TID  . latanoprost  1 drop Both Eyes QHS  . levothyroxine  25 mcg Oral  QAC breakfast  . lidocaine  1 application Topical Q M,W,F-HD  . metoprolol succinate  25 mg Oral Daily  . multivitamin  1 tablet Oral QHS  . multivitamin-lutein  1 capsule Oral Daily  . sevelamer carbonate  800 mg Oral TID WC  . vitamin C  250 mg Oral BID  . Vitamin D (Ergocalciferol)  50,000 Units Oral Q Mon   Continuous Infusions: PRN Meds:.acetaminophen **OR** acetaminophen, guaiFENesin-dextromethorphan, hydrocortisone cream, LORazepam, ondansetron **OR** ondansetron (ZOFRAN) IV, traMADol Medications Prior to Admission:  Prior to Admission medications   Medication Sig Start Date End Date Taking? Authorizing Provider  acetaminophen (TYLENOL) 325 MG tablet Take 2 tablets (650 mg total) by mouth every 4 (four) hours as needed for mild pain or moderate pain. 05/14/16  Yes Wieting, Richard, MD  cefdinir (OMNICEF) 300 MG capsule Take 1 capsule (300 mg total) by mouth every other day for 14 days. 09/08/17 09/22/17 Yes Gouru, Aruna, MD  guaiFENesin-dextromethorphan (ROBITUSSIN DM) 100-10 MG/5ML syrup Take 5 mLs by mouth every 4 (four) hours as needed for cough. 06/01/17  Yes Dustin Flock, MD  hydrocortisone 2.5 % cream Apply 1 application topically 3 (three) times daily.    Yes [provider]  hydrOXYzine (ATARAX/VISTARIL) 25 MG tablet Take 12.5 mg by mouth 3 (three) times daily.   Yes [provider]  latanoprost (XALATAN) 0.005 % ophthalmic solution Place 1 drop into both eyes at bedtime.   Yes [provider]  levothyroxine (SYNTHROID, LEVOTHROID) 25 MCG tablet Take 1 tablet (25 mcg total) by mouth daily. 02/04/17  Yes Bettey Costa, MD  lidocaine (LMX) 4 % cream Apply 1 application topically every Monday, Wednesday, and Friday with hemodialysis.   Yes [provider]  metoprolol succinate (TOPROL-XL) 25 MG 24 hr tablet Take 1 tablet (25 mg total) by mouth daily. 06/01/17  Yes Dustin Flock, MD  multivitamin (RENA-VIT) TABS tablet Take 1 tablet by mouth  at  bedtime. 09/07/17  Yes Gouru, Aruna, MD  ondansetron (ZOFRAN) 4 MG tablet Take 1 tablet (4 mg total) by mouth every 6 (six) hours as needed for nausea. 09/07/17  Yes Gouru, Illene Silver, MD  sevelamer carbonate (RENVELA) 800 MG tablet Take 1 tablet (800 mg total) by mouth 3 (three) times daily with meals. 06/01/17  Yes Dustin Flock, MD  traMADol (ULTRAM) 50 MG tablet Take 1-2 tablets (50-100 mg total) by mouth every 6 (six) hours as needed for moderate pain. 09/07/17  Yes Gouru, Illene Silver, MD  vitamin C (VITAMIN C) 250 MG tablet Take 1 tablet (250 mg total) by mouth 2 (two) times daily. 09/07/17  Yes Gouru, Illene Silver, MD  Vitamin D, Ergocalciferol, (DRISDOL) 50000 units CAPS capsule Take 50,000 Units by mouth every Monday.  04/17/15  Yes [provider]  Amino Acids-Protein Hydrolys (FEEDING SUPPLEMENT, PRO-STAT SUGAR FREE 64,) LIQD Take 30 mLs by mouth 2 (two) times daily. 09/07/17   Nicholes Mango, MD   Allergies  Allergen Reactions  . Statins Other (See Comments)    Muscle weakness severe  . Codeine Sulfate Nausea And Vomiting  . Codeine Other (See Comments)    GI UPSET  . Effexor [Venlafaxine] Nausea Only  . Ezetimibe-Simvastatin Other (See Comments)    Muscle weakness   Review of Systems  Unable to perform ROS: Acuity of condition   Physical Exam  Constitutional: She is easily aroused. She appears ill.  HENT:  Head: Normocephalic and atraumatic.  Pulmonary/Chest: No accessory muscle usage. No tachypnea. No respiratory distress.  Abdominal: There is no tenderness.  Neurological: She is alert and easily aroused. She is disoriented.  Skin: Skin is warm and dry. There is pallor.  Psychiatric: Her speech is delayed. Cognition and memory are impaired. She is inattentive.  Nursing note and vitals reviewed.  Vital Signs: BP 122/64 (BP Location: Left Arm)   Pulse 85   Temp 97.9 F (36.6 C) (Oral)   Resp 15   Ht _0  (1.626 m)   Wt 53.5 kg   SpO2 97%   BMI 20.25 kg/m  Pain Scale:  PAINAD POSS *See Group Information*: S-Acceptable,Sleep, easy to arouse Pain Score: Asleep   SpO2: SpO2: 97 % O2 Device:SpO2: 97 % O2 Flow Rate: .O2 Flow Rate (L/min): 5 L/min  IO: Intake/output summary:   Intake/Output Summary (Last 24 hours) at 09/21/2017 1215 Last data filed at 09/20/2017 1345 Gross per 24 hour  Intake -  Output 1033 ml  Net -1033 ml    LBM: Last BM Date: 09/19/17 Baseline Weight: Weight: 50.7 kg Most recent weight: Weight: 53.5 kg     Palliative Assessment/Data: PPS 30%   Flowsheet Rows     Most Recent Value  Intake Tab  Referral Department  Hospitalist  Unit at Time of Referral  Med/Surg Unit  Palliative Care Primary Diagnosis  Nephrology  Palliative Care Type  Return patient Palliative Care  Reason for referral  Clarify Goals of Care  Date first seen by Palliative Care  09/21/17  Clinical Assessment  Palliative Performance Scale Score  30%  Psychosocial & Spiritual Assessment  Palliative Care Outcomes  Patient/Family meeting held?  Yes  Who was at the meeting?  brother  Palliative Care Outcomes  Clarified goals of care, Provided end of life care assistance, Counseled regarding hospice, Provided psychosocial or spiritual support, ACP counseling assistance, Linked to palliative care logitudinal support      Time In: 1030 Time Out: 1200 Time Total: 54mn Greater than 50%  of this time was spent counseling and coordinating care related to the above assessment and plan.  Signed by:  Ihor Dow, FNP-C Palliative Medicine Team  Phone: [phone] Fax: [phone]   Please contact Palliative Medicine Team phone at [phone] for questions and concerns.  For individual provider: See Shea Evans

## 2017-09-21 NOTE — Progress Notes (Signed)
Nutrition Follow Up Note   DOCUMENTATION CODES:   Severe malnutrition in context of chronic illness  INTERVENTION:   Prostat liquid protein PO 30 ml TID with meals, each supplement provides 100 kcal, 15 grams protein.  Boost Breeze po TID, each supplement provides 250 kcal and 9 grams of protein  Rena-vite daily  Ocuvite daily for wound healing (provides zinc, vitamin A, vitamin C, Vitamin E, copper, and selenium)  Vitamin C '250mg'$  po BID  Dysphagia 3 diet   NUTRITION DIAGNOSIS:   Severe Malnutrition related to chronic illness(ESRD on HD, FTT) as evidenced by severe fat depletion, severe muscle depletion.  GOAL:   Patient will meet greater than or equal to 90% of their needs  -not met  MONITOR:   PO intake, Supplement acceptance, Labs, Weight trends, Skin, I & O's   ASSESSMENT:   84 year old female recently discharged from the hospital on 09/07/2017 due to failure to thrive, severe peripheral vascular disease, left leg cellulitis.  Her history is significant for coronary artery disease, MI, status post pacemaker, history of DVT/PE status post IVC filter placement, CHF, end-stage renal disease on dialysis. Pt was discharged back to [facility], palliative care conversations during previous admission. Patient presented for an unwitnessed fall with scalp laceration. Subsequently was noted that the patient had elevated troponin, pleural effusion, C7 fracture, treated with collar.   Pt s/p thoracentesis with 1.72m fluid removal 8/22  Pt too lethargic to eat or take medications today. Palliative care following for GOC.   Medications reviewed and include: colace, synthroid, rena-vite, ocuvite, renvela, vitamin C, vit D  Labs reviewed: P 9.1(H)- 8/26 iPTH- 268(H)- 8/24  Diet Order:   Diet Order            DIET DYS 3 Room service appropriate? Yes; Fluid consistency: Thin  Diet effective now             EDUCATION NEEDS:   No education needs have been identified at  this time  Skin:  Skin Assessment: Reviewed RN Assessment(two areas noted open on the right lower ext: each aprox. 2cm x 1.5cm and 3cm x 1.5cm x 0.1cm, ecchymosis, skin tears)  Last BM:  8/25- type 4  Height:   Ht Readings from Last 1 Encounters:  09/16/17 '5\' 4"'$  (1.626 m)    Weight:   Wt Readings from Last 1 Encounters:  09/21/17 53.5 kg    Ideal Body Weight:  54.5 kg  BMI:  Body mass index is 20.25 kg/m.  Estimated Nutritional Needs:   Kcal:  1200-1500kcal/day   Protein:  78-88g/day   Fluid:  1.2L/day   CKoleen DistanceMS, RD, LDN Pager #- [phone]#- [phone] Hours Pager: [phone]

## 2017-09-21 NOTE — Progress Notes (Signed)
[facility] [place] [name]    MR#:  [id number]  DATE OF BIRTH:  06/04/1926  SUBJECTIVE:  CHIEF COMPLAINT:   Chief Complaint  Patient presents with  . Fall  . Laceration   Sent after a fall.  Noted to have large right-sided pleural effusion and cervical vertebral fracture.   Confused today.  REVIEW OF SYSTEMS:   Review of Systems  Unable to perform ROS: Mental status change    DRUG ALLERGIES:   Allergies  Allergen Reactions  . Statins Other (See Comments)    Muscle weakness severe  . Codeine Sulfate Nausea And Vomiting  . Codeine Other (See Comments)    GI UPSET  . Effexor [Venlafaxine] Nausea Only  . Ezetimibe-Simvastatin Other (See Comments)    Muscle weakness    VITALS:  Blood pressure 122/64, pulse 85, temperature 97.9 F (36.6 C), temperature source Oral, resp. rate 15, height 5\' 4"  (1.626 m), weight 53.5 kg, SpO2 97 %.  PHYSICAL EXAMINATION:  GENERAL:  84 y.o.-year-old malnourished patient lying in the bed with no acute distress.  EYES: Pupils equal, round, reactive to light and accommodation. No scleral icterus. Extraocular muscles intact.  HEENT: Head atraumatic, normocephalic. Oropharynx and nasopharynx clear.  NECK:  Supple, no jugular venous distention. No thyroid enlargement, no tenderness.  LUNGS: Normal breath sounds bilaterally, no wheezing, rales,rhonchi or crepitation. No use of accessory muscles of respiration.  CARDIOVASCULAR: S1, S2 normal. No murmurs, rubs, or gallops.  ABDOMEN: Soft, nontender, nondistended. Bowel sounds present. No organomegaly or mass.  EXTREMITIES: No pedal edema, cyanosis, or clubbing.  NEUROLOGIC: Not following instructions PSYCHIATRIC: The patient is drowsy SKIN: No obvious rash, lesion, or ulcer.   Physical Exam LABORATORY PANEL:   CBC Recent Labs  Lab 09/19/17 0431  WBC 5.7  HGB 10.9*  HCT 32.7*  PLT 193    ------------------------------------------------------------------------------------------------------------------  Chemistries  Recent Labs  Lab 09/16/17 0118  09/19/17 0431  NA 138   < > 139  K 3.5   < > 4.0  CL 99   < > 97*  CO2 27   < > 32  GLUCOSE 88   < > 99  BUN 43*   < > 36*  CREATININE 3.57*   < > 3.36*  CALCIUM 8.4*   < > 8.4*  AST 23  --   --   ALT 16  --   --   ALKPHOS 86  --   --   BILITOT 1.1  --   --    < > = values in this interval not displayed.   ------------------------------------------------------------------------------------------------------------------  Cardiac Enzymes Recent Labs  Lab 09/17/17 0157 09/17/17 0924  TROPONINI 0.76* 1.05*   ------------------------------------------------------------------------------------------------------------------  RADIOLOGY:  No results found.  ASSESSMENT AND PLAN:   Active Problems:   Fall   Recurrent right pleural effusion   * Acute metabolic encephalopathy with worsening dementia No improvement Will benefit from hospice but family wants to continue dialysis at this time as per patient's prior wishes  * Right Pleural effusion   Thoracentesis -1.9 L fluid removed. Transudate.  Inadequate lung reexpansion and will likely reaccumulate.   Repeat chest x-ray shows slowly reaccumulating pleural effusion Appreciate pulmonary input.  *Elevated troponin.  No acute EKG changes.  No complaints of chest pain.  Discussed with Dr. Ubaldo Glassing.  Does not feel this is acute MI.  Likely due to effusion in setting of end-stage renal disease. Patient does have cardiomyopathy with ejection  fraction of 20 to 25%  * Fall.  Due to deconditioning.   * ESRD: On hemodialysis Monday Wednesday Friday.   Nephrology on board for dialysis needs  * Cervical fracture: C7. emergency department physician discussed with neurosurgery placement of hard collar if tolerated or soft collar if necessary.  He said to get cervical spine  x-ray with flexion and extension but patient is not cooperative. Fracture is stable.  She has no neurologic deficits. She is not very compliant with cervical collar. Family has been made aware about risks of not wearing collar but patient not compliant.  *  Recent cellulitis on legs     Currently on Omnicef, since discharged last week, continue.  All the records are reviewed and case discussed with Care Management/Social Worker Management plans discussed with the patient, family and they are in agreement.  CODE STATUS: DNR  TOTAL TIME TAKING CARE OF THIS PATIENT: 35 minutes.   POSSIBLE D/C IN 1-2 DAYS, DEPENDING ON CLINICAL CONDITION.  Neita Carp M.D on 09/21/2017   Between 7am to 6pm - Pager - [phone]  After 6pm go to [email or web address] - password [email or web address] [facility]  Office  [phone]  CC: Primary care physician; Kirk Ruths, MD  Note: This dictation was prepared with Dragon dictation along with smaller phrase technology. Any transcriptional errors that result from this process are unintentional.

## 2017-09-21 NOTE — Progress Notes (Signed)
[facility]  ROUNDING NOTE   Subjective:   Hemodialysis treatment yesterday. Tolerated treatment. UF of 1033  More awake. But still confused.    Objective:  Vital signs in last 24 hours:  Temp:  [97.6 F (36.4 C)-98.2 F (36.8 C)] 97.9 F (36.6 C) (08/27 0440) Pulse Rate:  [84-88] 85 (08/27 0926) Resp:  [11-25] 15 (08/27 0440) BP: (87-122)/(44-64) 122/64 (08/27 0926) SpO2:  [93 %-98 %] 97 % (08/27 0926) Weight:  [53.5 kg] 53.5 kg (08/27 0500)  Weight change:  Filed Weights   09/20/17 0930 09/20/17 1345 09/21/17 0500  Weight: 58.5 kg 53.5 kg 53.5 kg    Intake/Output: I/O last 3 completed shifts: In: -  Out: 1033 [Other:1033]   Intake/Output this shift:  No intake/output data recorded.  Physical Exam: General: NAD, laying in bed  Head: +hard of hearing  Eyes: Anicteric, PERRL  Neck: Supple, trachea midline  Lungs:  Clear to auscultation  Heart: Regular rate and rhythm  Abdomen:  Soft, nontender,   Extremities: no peripheral edema.  Neurologic: Not able to answer question  Skin: No lesions  Access: Right AVF    Basic Metabolic Panel: Recent Labs  Lab 09/16/17 0118 09/17/17 0924 09/18/17 1535 09/19/17 0431 09/20/17 1017  NA 138 140  --  139  --   K 3.5 4.3  --  4.0  --   CL 99 99  --  97*  --   CO2 27 27  --  32  --   GLUCOSE 88 160*  --  99  --   BUN 43* 62*  --  36*  --   CREATININE 3.57* 5.15*  --  3.36*  --   CALCIUM 8.4* 8.8*  --  8.4*  --   PHOS  --   --  6.6*  --  9.1*    Liver Function Tests: Recent Labs  Lab 09/16/17 0118  AST 23  ALT 16  ALKPHOS 86  BILITOT 1.1  PROT 7.2  ALBUMIN 3.5   No results for input(s): LIPASE, AMYLASE in the last 168 hours. No results for input(s): AMMONIA in the last 168 hours.  CBC: Recent Labs  Lab 09/16/17 0118 09/17/17 0924 09/19/17 0431  WBC 5.3 6.1 5.7  NEUTROABS  --   --  4.2  HGB 11.7* 11.5* 10.9*  HCT 35.6 34.8* 32.7*  MCV 96.8 97.2 97.5  PLT 197 218 193    Cardiac  Enzymes: Recent Labs  Lab 09/16/17 0528 09/16/17 1115 09/16/17 1651 09/17/17 0157 09/17/17 0924  TROPONINI 0.11* 0.13* 0.46* 0.76* 1.05*    BNP: Invalid input(s): POCBNP  CBG: No results for input(s): GLUCAP in the last 168 hours.  Microbiology: Results for orders placed or performed during the hospital encounter of 09/16/17  Body fluid culture     Status: None   Collection Time: 09/16/17  3:25 PM  Result Value Ref Range Status   Specimen Description   Final    PLEURAL Performed at [facility], [address], [place], [place] [address]    Special Requests   Final    NONE Performed at [facility], [address] [place], [place] [address]    Gram Stain   Final    RARE WBC PRESENT, PREDOMINANTLY MONONUCLEAR NO ORGANISMS SEEN    Culture   Final    NO GROWTH 3 DAYS Performed at [facility], [place] [address], [place], [place] [address]    Report Status 09/20/2017 FINAL  Final  Coagulation Studies: No results for input(s): LABPROT, INR in the last 72 hours.  Urinalysis: No results for input(s): COLORURINE, LABSPEC, PHURINE, GLUCOSEU, HGBUR, BILIRUBINUR, KETONESUR, PROTEINUR, UROBILINOGEN, NITRITE, LEUKOCYTESUR in the last 72 hours.  Invalid input(s): APPERANCEUR    Imaging: No results found.   Medications:    . cefdinir  300 mg Oral QODAY  . Chlorhexidine Gluconate Cloth  6 each Topical Q0600  . docusate sodium  100 mg Oral BID  . feeding supplement  1 Container Oral TID BM  . feeding supplement (PRO-STAT SUGAR FREE 64)  30 mL Oral BID  . hydrOXYzine  12.5 mg Oral TID  . latanoprost  1 drop Both Eyes QHS  . levothyroxine  25 mcg Oral QAC breakfast  . lidocaine  1 application Topical Q M,W,F-HD  . metoprolol succinate  25 mg Oral Daily  . multivitamin  1 tablet Oral QHS  . multivitamin-lutein  1 capsule Oral Daily  . sevelamer carbonate  800 mg Oral TID WC  . vitamin C  250 mg Oral BID  . Vitamin D (Ergocalciferol)  50,000  Units Oral Q Mon   acetaminophen **OR** acetaminophen, guaiFENesin-dextromethorphan, hydrocortisone cream, LORazepam, ondansetron **OR** ondansetron (ZOFRAN) IV, traMADol  Assessment/ Plan:  Sharon [name] is a 84 y.o. white female with end stage renal disease on hemodialysis, coronary artery disease, pacemaker, Meniere's, congestive heart failure, peripheral vascular disease  MWF [facility] permcath/left AVF EDW 48.5kg.   1. End-stage renal disease:MWF. Tolerated hemodialysis treatment yesterday.  - Dialysis for tomorrow.   2. Anemia of chronic kidney disease: Hemoglobin 10.9.  - EPO as outpatient  3. Secondary hyperparathyroidism with hyperphosphatemia: PTH 788 on 8/16 - Renvela with meals.  4. Hypertension:   - metoprolol    LOS: 3 Sharon [name] 8/27/201911:15 AM

## 2017-09-21 NOTE — Progress Notes (Signed)
Met with patient's healthcare power of attorney, and at bedside.  Patient continues to be confused.  Brother is wanting to continue hemodialysis as those were patient's wishes.  Understands that she is nearing end of life and would be unable to tolerate dialysis soon.  He would like to watch her 1 more day and if no improvement will consider hospice services since stopping dialysis.

## 2017-09-21 NOTE — Progress Notes (Signed)
Physical Therapy Treatment Patient Details [name] MRN: [id number] DOB: Feb 21, 1926 Today's Date: 09/21/2017    History of Present Illness 84y/o female history of CAD and MI status post pacemaker placement, DVT/PE status post IVC filter placement, CHF and end-stage renal disease on dialysis presents to the emergency department from the nursing home after a fall.  She was found to have C7 fx     PT Comments    Pt is able to do some limited in bed exercises and at times showed some effort but she was sleepy and focused on discussing lactose issues and would not stay on task despite a lot of cuing and encouragement from PT.  Despite cuing and encouragement to get to sitting EOB she in practicality did not show mental or physical ability to be up and moving on this date.  Per today's performance she is not safe to go back to [facility], pt was vastly different from last PT visit and apparently she has been labile regarding mental status/awarenss/participation.  Follow Up Recommendations  SNF(inconsistent, wouldn't be safe to go back to [facility])     Equipment Recommendations       Recommendations for Other Services       Precautions / Restrictions Precautions Precautions: Fall Restrictions Weight Bearing Restrictions: No    Mobility  Bed Mobility               General bed mobility comments: Pt laying in bed, ambivelant about the idea of working with PT, did not actively assist when PT was attempting to assist her to sitting  Transfers                    Ambulation/Gait                 Stairs             Wheelchair Mobility    Modified Rankin (Stroke Patients Only)       Balance                                            Cognition Arousal/Alertness: Awake/alert Behavior During Therapy: Impulsive Overall Cognitive Status: Difficult to assess                                 General  Comments: Pt sleeping on arrival, she did wake and interact, but was confused and agitated about dairy products t/o session      Exercises General Exercises - Lower Extremity Ankle Circles/Pumps: PROM;AAROM;10 reps Heel Slides: AAROM;10 reps Hip ABduction/ADduction: AAROM;5 reps Hip Flexion/Marching: AAROM;10 reps    General Comments        Pertinent Vitals/Pain Pain Assessment: (unable to rate, did not indicate pain t/o session)    Home Living                      Prior Function            PT Goals (current goals can now be found in the care plan section) Progress towards PT goals: Not progressing toward goals - comment(pt very weak and limited today, no mobility)    Frequency    Min 2X/week      PT Plan (per mental status)    Co-evaluation  AM-PAC PT "6 Clicks" Daily Activity  Outcome Measure  Difficulty turning over in bed (including adjusting bedclothes, sheets and blankets)?: Unable Difficulty moving from lying on back to sitting on the side of the bed? : Unable Difficulty sitting down on and standing up from a chair with arms (e.g., wheelchair, bedside commode, etc,.)?: Unable Help needed moving to and from a bed to chair (including a wheelchair)?: Total Help needed walking in hospital room?: Total Help needed climbing 3-5 steps with a railing? : Total 6 Click Score: 6    End of Session Equipment Utilized During Treatment: Gait belt Activity Tolerance: Patient limited by lethargy Patient left: with bed alarm set;with call bell/phone within reach Nurse Communication: Mobility status PT Visit Diagnosis: Muscle weakness (generalized) (M62.81);Difficulty in walking, not elsewhere classified (R26.2)     Time: 0352-4818 PT Time Calculation (min) (ACUTE ONLY): 14 min  Charges:  $Therapeutic Exercise: 8-22 mins                     Kreg Shropshire, DPT 09/21/2017, 1:49 PM

## 2017-09-22 DIAGNOSIS — W19XXXD Unspecified fall, subsequent encounter: Secondary | ICD-10-CM

## 2017-09-22 DIAGNOSIS — R4182 Altered mental status, unspecified: Secondary | ICD-10-CM

## 2017-09-22 DIAGNOSIS — S129XXA Fracture of neck, unspecified, initial encounter: Secondary | ICD-10-CM

## 2017-09-22 MED ORDER — MORPHINE SULFATE (PF) 2 MG/ML IV SOLN: 1.0000 mg | INTRAVENOUS | Status: DC | PRN

## 2017-09-22 MED ORDER — LORAZEPAM 2 MG/ML IJ SOLN: 0.5000 mg | INTRAMUSCULAR | Status: DC | PRN

## 2017-09-22 NOTE — Progress Notes (Signed)
Advance care planning  Met with Patients' HCPOA- Ed and his 2 children at bedside. Patient had just returned from HD and was still sleeping. Ed has been having a tough time deciding between Hospice and continuing HD. Patient had till recently wanted to continue dialysis and now unable to make medical decisions. She has definitely declined over past few months now is weak, confused, SOB. Has multiple comorbidities including dementia, Cardiomyopathy, ESRD, HTN, Recurrent right pleural effusion.  Patient was confused since I saw her 4 days back and unable to make decisions. Ed was waiting to see how she does today and stop HD if she had not improved. Family requested I talk to patient and explain HD is futile at this time and that patient is hospice appropriate. Attempted to have a meaningful conversation with patient but she was unable to participate.  After explaining patient is nearing end of life with cyanosis, extreme SOB, severe malnutrition, not taking her medications family has requested we stop HD and transfer patient to hospice home when bed is available.  Added ativan and morphine.   Comfort measures. DNR. RN may pronounce.  Time spent at bedside discussing with family/palliative care team - 50 minutes

## 2017-09-22 NOTE — Progress Notes (Signed)
Rept received from Duffy Rhody [facility] RN. Pt resting semi-fowlers in bed visiting with family. Family attempting to get pt to eat lunch. Oxygen  6 lpm via [place] intact. No visible s/s distress or c/o such. Pt noted to get very winded with min activity. Bed alarm intact. Will continue to monitor.

## 2017-09-22 NOTE — Progress Notes (Signed)
Daily Progress Note   Patient [name]       Date: 09/22/2017 DOB: 1926-10-06  Age: 84 y.o. MRN#: [id number] Attending Physician: [name], MD Primary Care Physician: Kirk Ruths, MD Admit Date: 09/16/2017  Reason for Consultation/Follow-up: Establishing goals of care  Subjective: Patient awake, alert, but disoriented. She denies pain or discomfort and does not appear to be in acute distress. She is pleasantly confused and unable to make medical decisions for herself. Patient asking to eat and tolerated half a plate of spaghetti and sips of tea.    GOC:  Family at bedside including brother/HCPOA, Ed and two other family members. Dr. Darvin Neighbours also at bedside discussing diagnoses, interventions, and recommendation for transition to comfort measures with transition to hospice. Per Ed's wishes, we have attempted to discuss with patient but mental status is altered and unsure if she truly understands that she will no longer receive dialysis.   She continues to ask if she can go home. She does make appropriate comments including that she will miss her family and she is worried about her family. She has been speaking with many deceased family members.   Further discussed transition to comfort focused care with goal of comfort, peace, and dignity at EOL. Educated on utilizing medications as needed to ensure relief from suffering. Ed tells me Lu will only take tylenol. Discussed EOL expectations. Ed tearful but understands poor prognosis <2 weeks with no further dialysis.    Therapeutic listening as patient shares stories with family and I. Emotional support provided.   Length of Stay: 4  Current Medications: Scheduled Meds:  . Chlorhexidine Gluconate Cloth  6 each Topical  Q0600  . docusate sodium  100 mg Oral BID  . feeding supplement  1 Container Oral TID BM  . feeding supplement (PRO-STAT SUGAR FREE 64)  30 mL Oral BID  . hydrOXYzine  12.5 mg Oral TID  . latanoprost  1 drop Both Eyes QHS  . levothyroxine  25 mcg Oral QAC breakfast  . lidocaine  1 application Topical Q M,W,F-HD  . metoprolol succinate  25 mg Oral Daily  . multivitamin  1 tablet Oral QHS  . multivitamin-lutein  1 capsule Oral Daily  . sevelamer carbonate  800 mg Oral TID WC  . Vitamin D (Ergocalciferol)  50,000 Units  Oral Q Mon    Continuous Infusions: . small volume/piggyback builder 200 mL/hr at 09/22/17 1347    PRN Meds: acetaminophen **OR** acetaminophen, guaiFENesin-dextromethorphan, hydrocortisone cream, LORazepam, ondansetron **OR** ondansetron (ZOFRAN) IV, traMADol  Physical Exam  Constitutional: She is cooperative. She appears ill.  HENT:  Head: Normocephalic and atraumatic.  Pulmonary/Chest: No accessory muscle usage. No tachypnea. No respiratory distress.  Abdominal: There is no tenderness.  Neurological: She is alert. She is disoriented.  Skin: Skin is warm and dry. There is cyanosis.  Cyanotic upper and lower extremities  Psychiatric: She has a normal mood and affect. Her speech is normal and behavior is normal. Cognition and memory are impaired.  Nursing note and vitals reviewed.          Vital Signs: BP 129/71 (BP Location: Left Arm)   Pulse 82   Temp (!) 97.5 F (36.4 C) (Oral)   Resp 16   Ht 5\' 4"  (1.626 m)   Wt 53.8 kg   SpO2 92%   BMI 20.38 kg/m  SpO2: SpO2: 92 % O2 Device: O2 Device: Nasal Cannula O2 Flow Rate: O2 Flow Rate (L/min): 6 L/min  Intake/output summary:   Intake/Output Summary (Last 24 hours) at 09/22/2017 1519 Last data filed at 09/22/2017 1151 Gross per 24 hour  Intake 1890 ml  Output 295 ml  Net 1595 ml   LBM: Last BM Date: 09/21/17 Baseline Weight: Weight: 50.7 kg Most recent weight: Weight: 53.8 kg       Palliative  Assessment/Data: PPS 30%   Flowsheet Rows     Most Recent Value  Intake Tab  Referral Department  Hospitalist  Unit at Time of Referral  Med/Surg Unit  Palliative Care Primary Diagnosis  Nephrology  Palliative Care Type  Return patient Palliative Care  Reason for referral  Clarify Goals of Care  Date first seen by Palliative Care  09/21/17  Clinical Assessment  Palliative Performance Scale Score  30%  Psychosocial & Spiritual Assessment  Palliative Care Outcomes  Patient/Family meeting held?  Yes  Who was at the meeting?  brother  Palliative Care Outcomes  Clarified goals of care, Provided end of life care assistance, Counseled regarding hospice, Provided psychosocial or spiritual support, ACP counseling assistance, Linked to palliative care logitudinal support      Patient Active Problem List   Diagnosis Date Noted  . Fracture of cervical spinous process, initial encounter ([facility])   . Altered mental status   . Recurrent right pleural effusion 09/18/2017  . Fall 09/16/2017  . Left leg cellulitis 08/30/2017  . Elevated troponin 06/28/2017  . Swelling of limb 06/08/2017  . C. difficile diarrhea   . Palliative care encounter   . Encounter for hospice care discussion   . Protein-calorie malnutrition, severe 05/25/2017  . HCAP (healthcare-associated pneumonia) 05/24/2017  . Complication from renal dialysis device 04/01/2017  . Displaced fracture of lateral end of right clavicle, initial encounter for closed fracture 02/16/2017  . Failure to thrive in adult 02/03/2017  . ESRD on hemodialysis ([place])   . Pleural effusion   . Palliative care by specialist   . Pulmonary embolism ([place]) 01/02/2017  . Syncope 01/01/2017  . Vision changes 08/27/2016  . Closed fracture of neck of femur ([place]) 05/26/2016  . Hip fracture ([place]) 05/10/2016  . Falls 04/27/2016  . Head injury 04/27/2016  . Age-related osteoporosis with current pathological fracture with routine healing 04/24/2016  .  Recurrent falls 04/24/2016  . Mild protein-calorie malnutrition ([place]) 04/24/2016  . ESRD on dialysis (  [place] [facility]) 04/10/2016  . Periprosthetic fracture around internal prosthetic left knee joint 01/26/2016  . Pressure injury of skin 12/15/2015  . Closed left subtrochanteric femur fracture ([place]) 12/15/2015  . Femur fracture, left ([place]) 12/14/2015  . Meniere disease   . Loss of weight 10/21/2015  . Clinical depression 06/20/2015  . Dysphagia 05/24/2015  . Imbalance 04/22/2015  . Chronic pain syndrome 05/22/2014  . Insomnia 03/13/2014  . Anxiety state 03/13/2014  . Chronic systolic CHF (congestive heart failure), NYHA class 3 ([place]) 01/15/2014  . OSA (obstructive sleep apnea) 01/15/2014  . Basal cell carcinoma of neck 11/14/2013  . DDD (degenerative disc disease), cervical 11/07/2013  . Cervical radiculitis 10/16/2013  . Benign essential hypertension 09/12/2013  . Memory loss 09/12/2013  . Systolic heart failure, chronic ([place]) 05/12/2013  . Goals of care, counseling/discussion 11/10/2012  . Sinus node dysfunction ([place]) 08/01/2012  . Low back pain 08/01/2012  . Cervical spine pain 05/02/2012  . Irritable bowel syndrome 11/02/2011  . Depression 04/01/2011  . Hypertension 04/01/2011    Palliative Care Assessment & Plan   Patient Profile: 84 y.o. female  with past medical history of ESRD on hemodialysis, CAD, MI, pacemaker, DVT, CHF EF 20-25% admitted on 09/16/2017 from SNF after fall. Found to have a C7 fracture with neurosurgery recommendation for hard or soft collar if necessary. Nephrology following for ESRD. Recurrent right pleural effusion s/p 1.9L removed by thoracentesis. Repeat chest xray shows reoccurring pleural effusion. Patient with continued acute metabolic encephalopathy with worsening dementia. Palliative medicine consultation for repeat goals of care.   Assessment: Fall Cervical fracture ESRD on hemodialysis Acute metabolic encephalopathy Right recurrent pleural  effusion Elevated troponin Adult failure to thrive  Recommendations/Plan:  After further discussions with family and attending, brother/HCPOA understands futility of continued dialysis and recommendation for transition to comfort measures. We have attempted to discuss this with patient, but she continues to have altered mental status. Does not have capacity to make medical decisions.   Transition to comfort focused care.   Utilize prn medications to relieve pain and suffering.   Comfort feeds per patient/family request.   SW consult for residential hospice placement.   Goals of Care and Additional Recommendations:  Limitations on Scope of Treatment: Full Comfort Care  Code Status: DNR/DNI   Code Status Orders  (From admission, onward)         Start     Ordered   09/16/17 0457  Do not attempt resuscitation (DNR)  Continuous    Question Answer Comment  In the event of cardiac or respiratory ARREST Do not call a "code blue"   In the event of cardiac or respiratory ARREST Do not perform Intubation, CPR, defibrillation or ACLS   In the event of cardiac or respiratory ARREST Use medication by any route, position, wound care, and other measures to relive pain and suffering. May use oxygen, suction and manual treatment of airway obstruction as needed for comfort.      09/16/17 0457        Code Status History    Date Active Date Inactive Code Status Order ID Comments User Context   08/30/2017 1501 09/07/2017 1900 Partial Code [id number]  Gorden Harms, MD Inpatient   06/29/2017 0905 07/01/2017 2019 DNR [id number]  Dustin Flock, MD Inpatient   06/28/2017 1239 06/29/2017 0904 DNR [id number]  Henreitta Leber, MD Inpatient   05/24/2017 2258 06/01/2017 1706 DNR [id number]  Vaughan Basta, MD Inpatient   02/16/2017 1928 02/17/2017 1949 Full Code [id number]  Poggi,  Marshall Cork, MD Inpatient   02/03/2017 1230 02/04/2017 1907 DNR [id number]  Nicholes Mango, MD ED   01/01/2017 2027 01/08/2017 1858 Full  Code [id number]  [name], MD ED   05/12/2016 0809 05/14/2016 1439 DNR [id number]  Max Sane, MD Inpatient   05/10/2016 0506 05/12/2016 0809 Full Code [id number]  Saundra Shelling, MD Inpatient   12/15/2015 1515 12/19/2015 0159 Full Code [id number]  Oletta Cohn, DO Inpatient   12/14/2015 1613 12/15/2015 1515 Full Code [id number]  Bettey Costa, MD Inpatient    Advance Directive Documentation     Most Recent Value  Type of Advance Directive  Out of facility DNR (pink MOST or yellow form)  Pre-existing out of facility DNR order (yellow form or pink MOST form)  Yellow form placed in chart (order not valid for inpatient use)  "MOST" Form in Place?  -       Prognosis:   < 2 weeks Unable to tolerate further hemodialysis. Severe decline in cognitive, functional, and nutritional status.   Discharge Planning:  Hospice facility  Care plan was discussed with Dr. Darvin Neighbours, RN, patient, HCPOA/brother at bedside, SW, family member at bedside.  Thank you for allowing the Palliative Medicine Team to assist in the care of this patient.   Time In: 1415 Time Out: 1515 Total Time 75min Prolonged Time Billed  no      Greater than 50%  of this time was spent counseling and coordinating care related to the above assessment and plan.  Ihor Dow, FNP-C Palliative Medicine Team  Phone: [phone] Fax: [phone]  Please contact Palliative Medicine Team phone at [phone] for questions and concerns.

## 2017-09-22 NOTE — Progress Notes (Signed)
Post HD assessment. Pt tolerated tx well without c/o. Tx terminated early r/t clotting, system clotted during the last 30 minutes of tx, MD aware. Net UF 295, goal not met.    09/22/17 1151  Hand-Off documentation  Report given to (Full Name) Everitt Amber  Report received from (Full Name) Stark Bray  Vital Signs  Temp 97.6 F (36.4 C)  Temp Source Axillary  Pulse Rate 85  Pulse Rate Source Monitor  Resp 17  BP 125/70  BP Location Left Arm  BP Method Automatic  Patient Position (if appropriate) Lying  Oxygen Therapy  SpO2 96 %  O2 Device Nasal Cannula  O2 Flow Rate (L/min) 6 L/min  Dialysis Weight  Weight 53.8 kg  Type of Weight Post-Dialysis  Post-Hemodialysis Assessment  Rinseback Volume (mL) 250 mL  KECN 40.2 V  Dialyzer Clearance Lightly streaked  Duration of HD Treatment -hour(s) 2.5 hour(s)  Hemodialysis Intake (mL) 500 mL  UF Total -Machine (mL) 795 mL  Net UF (mL) 295 mL  Tolerated HD Treatment Yes  AVG/AVF Arterial Site Held (minutes) 10 minutes  AVG/AVF Venous Site Held (minutes) 10 minutes  Fistula / Graft Right Forearm Arteriovenous fistula  Placement Date/Time: 04/15/16 1341   Placed prior to admission: No  Orientation: Right  Access Location: Forearm  Access Type: Arteriovenous fistula  Site Condition No complications  Fistula / Graft Assessment Present;Thrill;Bruit  Status Deaccessed  Drainage Description None

## 2017-09-22 NOTE — Progress Notes (Signed)
OT Cancellation Note  Patient Details [name] MRN: [id number] DOB: 06/24/1926   Cancelled Treatment:    Reason Eval/Treat Not Completed: Patient at procedure or test/ unavailable. Pt receiving dialysis, unavailable for OT at this time. Will continue therapy efforts as pt is available and medically appropriate.   Jeni Salles, MPH, MS, OTR/L ascom [phone] 09/22/17, 8:50 AM

## 2017-09-22 NOTE — Progress Notes (Signed)
[facility] [name]    MR#:  [id number]  DATE OF BIRTH:  1926-12-09  SUBJECTIVE:  CHIEF COMPLAINT:   Chief Complaint  Patient presents with  . Fall  . Laceration   Sent after a fall.  Noted to have large right-sided pleural effusion and cervical vertebral fracture.   Continues to be Confused today.  REVIEW OF SYSTEMS:   Review of Systems  Unable to perform ROS: Mental status change    DRUG ALLERGIES:   Allergies  Allergen Reactions  . Statins Other (See Comments)    Muscle weakness severe  . Codeine Sulfate Nausea And Vomiting  . Codeine Other (See Comments)    GI UPSET  . Effexor [Venlafaxine] Nausea Only  . Ezetimibe-Simvastatin Other (See Comments)    Muscle weakness    VITALS:  Blood pressure 126/65, pulse 81, temperature 98.2 F (36.8 C), temperature source Oral, resp. rate 16, height 5\' 4"  (1.626 m), weight 53.8 kg, SpO2 100 %.  PHYSICAL EXAMINATION:  GENERAL:  84 y.o.-year-old malnourished patient lying in the bed with no acute distress.  EYES: Pupils equal, round, reactive to light and accommodation. No scleral icterus. Extraocular muscles intact.  HEENT: Head atraumatic, normocephalic. Oropharynx and nasopharynx clear.  NECK:  Supple, no jugular venous distention. No thyroid enlargement, no tenderness.  LUNGS: Normal breath sounds bilaterally, no wheezing, rales,rhonchi or crepitation. No use of accessory muscles of respiration.  CARDIOVASCULAR: S1, S2 normal. No murmurs, rubs, or gallops.  ABDOMEN: Soft, nontender, nondistended. Bowel sounds present. No organomegaly or mass.  EXTREMITIES: No pedal edema, cyanosis, or clubbing.  NEUROLOGIC: Not following instructions PSYCHIATRIC: The patient is drowsy SKIN: No obvious rash, lesion, or ulcer.   Physical Exam LABORATORY PANEL:   CBC Recent Labs  Lab 09/19/17 0431  WBC 5.7  HGB 10.9*  HCT 32.7*  PLT 193    ------------------------------------------------------------------------------------------------------------------  Chemistries  Recent Labs  Lab 09/16/17 0118  09/19/17 0431  NA 138   < > 139  K 3.5   < > 4.0  CL 99   < > 97*  CO2 27   < > 32  GLUCOSE 88   < > 99  BUN 43*   < > 36*  CREATININE 3.57*   < > 3.36*  CALCIUM 8.4*   < > 8.4*  AST 23  --   --   ALT 16  --   --   ALKPHOS 86  --   --   BILITOT 1.1  --   --    < > = values in this interval not displayed.   ------------------------------------------------------------------------------------------------------------------  Cardiac Enzymes Recent Labs  Lab 09/17/17 0157 09/17/17 0924  TROPONINI 0.76* 1.05*   ------------------------------------------------------------------------------------------------------------------  RADIOLOGY:  No results found.  ASSESSMENT AND PLAN:   Active Problems:   Fall   Recurrent right pleural effusion   Fracture of cervical spinous process, initial encounter ([place])   Altered mental status   * Acute metabolic encephalopathy with worsening dementia No improvement Will benefit from hospice but family wants to continue dialysis at this time as per patient's prior wishes  * Right Pleural effusion   Thoracentesis -1.9 L fluid removed. Transudate.  Inadequate lung reexpansion and will likely reaccumulate.   Repeat chest x-ray showed slowly reaccumulating pleural effusion Appreciate pulmonary input.  *Elevated troponin.  No acute EKG changes.  No complaints of chest pain.  Discussed with Dr. Ubaldo Glassing.  Does not feel this is acute  MI.  Likely due to effusion in setting of end-stage renal disease. Patient does have cardiomyopathy with ejection fraction of 20 to 25%  * Fall.  Due to deconditioning.   * ESRD: On hemodialysis Monday Wednesday Friday.   Nephrology on board for dialysis needs  * Cervical fracture: C7. emergency department physician discussed with neurosurgery placement  of hard collar if tolerated or soft collar if necessary.  He said to get cervical spine x-ray with flexion and extension but patient is not cooperative. Fracture is stable.  She has no neurologic deficits. She is not very compliant with cervical collar. Family has been made aware about risks of not wearing collar but patient not compliant.  *  Recent cellulitis on legs     Currently on Omnicef, since discharged last week, continue.  Continues to deteriorate slowly each day  All the records are reviewed and case discussed with Care Management/Social Worker Management plans discussed with the patient, family and they are in agreement.  CODE STATUS: DNR  TOTAL TIME TAKING CARE OF THIS PATIENT: 35 minutes.   POSSIBLE D/C IN 1-2 DAYS, DEPENDING ON CLINICAL CONDITION.  Neita Carp M.D on 09/22/2017   Between 7am to 6pm - Pager - [phone]  After 6pm go to [email or web address] - password [email or web address] [facility]  Office  [phone]  CC: Primary care physician; Kirk Ruths, MD  Note: This dictation was prepared with Dragon dictation along with smaller phrase technology. Any transcriptional errors that result from this process are unintentional.

## 2017-09-22 NOTE — Progress Notes (Signed)
Post HD assessment    09/22/17 1201  Neurological  Level of Consciousness Alert  Orientation Level Oriented to person;Oriented to situation  Respiratory  Respiratory Pattern Regular;Unlabored  Chest Assessment Chest expansion symmetrical  Cardiac  ECG Monitor Yes  Antiarrhythmic device Yes  Antiarrhythmic device  Antiarrhythmic device Permanent Pacemaker  Vascular  R Radial Pulse +2  L Radial Pulse +2  Edema Generalized  Integumentary  Integumentary (WDL) X  Skin Color Appropriate for ethnicity;Red  Musculoskeletal  Musculoskeletal (WDL) X  Generalized Weakness Yes  Assistive Device None  GU Assessment  Genitourinary (WDL) X  Genitourinary Symptoms  (HD)  Psychosocial  Psychosocial (WDL) X  Patient Behaviors Cooperative;Anxious;Agitated  Needs Expressed Emotional;Physical  Emotional support given Given to patient

## 2017-09-22 NOTE — Progress Notes (Signed)
Pre HD assessment, pt is confused talking to persons not there.    09/22/17 0845  Neurological  Level of Consciousness Responds to Voice  Orientation Level Oriented to person  Respiratory  Respiratory Pattern Regular  Chest Assessment Chest expansion symmetrical  Bilateral Breath Sounds Diminished  Cough None  Cardiac  Pulse Regular  Heart Sounds S1, S2  ECG Monitor Yes  Vascular  R Radial Pulse +2  L Radial Pulse +2  Edema Generalized  Generalized Edema Other (Comment) (facial)  RLE Edema  (facial )  Psychosocial  Psychosocial (WDL) X  Patient Behaviors Restless  Emotional support given Given to patient

## 2017-09-22 NOTE — Progress Notes (Signed)
HD tx end, tx terminated early r/t system clotting, 30 minutes left in tx time ,MD aware.   09/22/17 1145  Vital Signs  Pulse Rate 86  Pulse Rate Source Monitor  Resp 16  BP 116/67  BP Location Left Arm  BP Method Automatic  Patient Position (if appropriate) Lying  Oxygen Therapy  SpO2 94 %  O2 Device Nasal Cannula  O2 Flow Rate (L/min) 6 L/min  During Hemodialysis Assessment  Dialysis Fluid Bolus Normal Saline  Bolus Amount (mL) 250 mL  Intra-Hemodialysis Comments Tx completed (pt's system clotted, 30 minutes left in tx,MD aware)

## 2017-09-22 NOTE — Progress Notes (Signed)
Pre HD Tx    09/22/17 0845  Vital Signs  Temp 98 F (36.7 C)  Temp Source Oral  Pulse Rate 82  Pulse Rate Source Monitor  Resp 20  BP (!) 114/57  BP Location Left Arm  BP Method Automatic  Patient Position (if appropriate) Lying  Oxygen Therapy  SpO2 93 %  O2 Device Nasal Cannula  O2 Flow Rate (L/min) 6 L/min  Pain Assessment  Pain Scale PAINAD  Pain Score 0  Time-Out for Hemodialysis  What Procedure? HD   Pt Identifiers(min of two) First/Last Name;MRN/Account#  Correct Site? Yes  Correct Side? Yes  Correct Procedure? Yes  Consents Verified? Yes  Rad Studies Available? N/A  Safety Precautions Reviewed? Yes  [employer] Number [id number]  Station Number 2  UF/Alarm Test Passed  Conductivity: Meter 13.8  Conductivity: Machine  13.8  pH 7.4  Reverse Osmosis Main  Normal Saline Lot Number [id number]  Dialyzer Lot Number [id number]  Disposable Set Lot Number [id number]  Machine Temperature 98.6 F (37 C)  Musician and Audible Yes  Blood Lines Intact and Secured Yes  Pre Treatment Patient Checks  Vascular access used during treatment Fistula  Hepatitis B Surface Antigen Results Negative  Date Hepatitis B Surface Antigen Drawn 08/30/17  Isolation Initiated Yes  Hepatitis B Surface Antibody 10  Date Hepatitis B Surface Antibody Drawn 08/30/17  Hemodialysis Consent Verified Yes  Hemodialysis Standing Orders Initiated Yes  ECG (Telemetry) Monitor On Yes  Prime Ordered Normal Saline  Length of  DialysisTreatment -hour(s) 3 Hour(s)  Dialysis Treatment Comments Na 140  Dialyzer Elisio 17H NR  Dialysate 3K, 2.5 Ca  Dialysis Anticoagulant None  Dialysate Flow Ordered 600  Blood Flow Rate Ordered 300 mL/min  Ultrafiltration Goal 0.5 Liters  Dialysis Blood Pressure Support Ordered Normal Saline

## 2017-09-22 NOTE — Progress Notes (Signed)
HD Tx started, pt had infiltrated on venous side, needed re cannulation below the infiltration which was successful    09/22/17 0900  Vital Signs  Pulse Rate 81  Pulse Rate Source Monitor  Resp (!) 23  BP 113/63  Oxygen Therapy  SpO2 95 %  During Hemodialysis Assessment  Blood Flow Rate (mL/min) 300 mL/min  Arterial Pressure (mmHg) -110 mmHg  Venous Pressure (mmHg) 100 mmHg  Transmembrane Pressure (mmHg) 60 mmHg  Ultrafiltration Rate (mL/min) 340 mL/min  Dialysate Flow Rate (mL/min) 600 ml/min  Conductivity: Machine  14  HD Safety Checks Performed Yes  Dialysis Fluid Bolus Normal Saline  Bolus Amount (mL) 250 mL  Intra-Hemodialysis Comments Tx initiated  Education / Care Plan  Dialysis Education Provided No (Comment)  Documented Education in Care Plan  (Pt confused )  Fistula / Graft Right Forearm Arteriovenous fistula  Placement Date/Time: 04/15/16 1341   Placed prior to admission: No  Orientation: Right  Access Location: Forearm  Access Type: Arteriovenous fistula  Site Condition No complications  Fistula / Graft Assessment Present;Thrill;Bruit  Status Patent;Accessed  Needle Size 15 g  Drainage Description None

## 2017-09-22 NOTE — Progress Notes (Signed)
[facility]  ROUNDING NOTE   Subjective:   Patient is confused and speaking incoherently.   Brought down to dialysis unit for dialysis treatment. Patient states that she still wants to continue dialysis when asked. However unable to keep her oriented to have a meaningful conversation.    Objective:  Vital signs in last 24 hours:  Temp:  [97.9 F (36.6 C)-99.5 F (37.5 C)] 98 F (36.7 C) (08/28 0504) Pulse Rate:  [81-86] 85 (08/28 0737) Resp:  [16-18] 18 (08/28 0737) BP: (110-130)/(54-71) 126/58 (08/28 0737) SpO2:  [91 %-97 %] 95 % (08/28 0737)  Weight change:  Filed Weights   09/20/17 0930 09/20/17 1345 09/21/17 0500  Weight: 58.5 kg 53.5 kg 53.5 kg    Intake/Output: I/O last 3 completed shifts: In: 1890 [IV Piggyback:1890] Out: -    Intake/Output this shift:  No intake/output data recorded.  Physical Exam: General: NAD, laying in bed  Head: +hard of hearing  Eyes: Anicteric, PERRL  Neck: Supple, trachea midline  Lungs:  Clear to auscultation  Heart: Regular rate and rhythm  Abdomen:  Soft, nontender,   Extremities: no peripheral edema.  Psych +auditory hallucination  Neurologic: Moving all extremities, alert to self only  Skin: No lesions  Access: Right AVF    Basic Metabolic Panel: Recent Labs  Lab 09/16/17 0118 09/17/17 0924 09/18/17 1535 09/19/17 0431 09/20/17 1017  NA 138 140  --  139  --   K 3.5 4.3  --  4.0  --   CL 99 99  --  97*  --   CO2 27 27  --  32  --   GLUCOSE 88 160*  --  99  --   BUN 43* 62*  --  36*  --   CREATININE 3.57* 5.15*  --  3.36*  --   CALCIUM 8.4* 8.8*  --  8.4*  --   PHOS  --   --  6.6*  --  9.1*    Liver Function Tests: Recent Labs  Lab 09/16/17 0118  AST 23  ALT 16  ALKPHOS 86  BILITOT 1.1  PROT 7.2  ALBUMIN 3.5   No results for input(s): LIPASE, AMYLASE in the last 168 hours. No results for input(s): AMMONIA in the last 168 hours.  CBC: Recent Labs  Lab 09/16/17 0118 09/17/17 0924  09/19/17 0431  WBC 5.3 6.1 5.7  NEUTROABS  --   --  4.2  HGB 11.7* 11.5* 10.9*  HCT 35.6 34.8* 32.7*  MCV 96.8 97.2 97.5  PLT 197 218 193    Cardiac Enzymes: Recent Labs  Lab 09/16/17 0528 09/16/17 1115 09/16/17 1651 09/17/17 0157 09/17/17 0924  TROPONINI 0.11* 0.13* 0.46* 0.76* 1.05*    BNP: Invalid input(s): POCBNP  CBG: No results for input(s): GLUCAP in the last 168 hours.  Microbiology: Results for orders placed or performed during the hospital encounter of 09/16/17  Body fluid culture     Status: None   Collection Time: 09/16/17  3:25 PM  Result Value Ref Range Status   Specimen Description   Final    PLEURAL Performed at [facility], [address], [place], [place] [address]    Special Requests   Final    NONE Performed at [facility], [place], [place] [address]    Gram Stain   Final    RARE WBC PRESENT, PREDOMINANTLY MONONUCLEAR NO ORGANISMS SEEN    Culture   Final    NO GROWTH 3  DAYS Performed at [facility], [place] [address], [place], [place] [address]    Report Status 09/20/2017 FINAL  Final  Fungus Culture With Stain     Status: None (Preliminary result)   Collection Time: 09/16/17  3:25 PM  Result Value Ref Range Status   Fungus Stain Final report  Final    Comment: (NOTE) Performed At: [facility] [place], [place] [id number] Rush Farmer MD BW:[id number]    Fungus (Mycology) Culture PENDING  Incomplete   Fungal Source         Final    Comment: Lymphocytosis. If a lymphoproliferative disorder is in the clinical differential diagnosis, fresh unrefrigerated fluid for immunophenotyping by flow cytometry may be helpful in the appropriate clinical setting.        Performed at [facility], [address] [place], [place] [address]   Fungus Culture Result     Status: None   Collection Time: 09/16/17  3:25 PM  Result Value Ref Range Status   Result 1 Comment   Final    Comment: (NOTE) KOH/Calcofluor preparation:  no fungus observed. Performed At: [facility] [place], [place] [id number] Rush Farmer MD RC:[id number]     Coagulation Studies: No results for input(s): LABPROT, INR in the last 72 hours.  Urinalysis: No results for input(s): COLORURINE, LABSPEC, PHURINE, GLUCOSEU, HGBUR, BILIRUBINUR, KETONESUR, PROTEINUR, UROBILINOGEN, NITRITE, LEUKOCYTESUR in the last 72 hours.  Invalid input(s): APPERANCEUR    Imaging: No results found.   Medications:   . small volume/piggyback builder 200 mL/hr at 09/21/17 2233   . Chlorhexidine Gluconate Cloth  6 each Topical Q0600  . docusate sodium  100 mg Oral BID  . feeding supplement  1 Container Oral TID BM  . feeding supplement (PRO-STAT SUGAR FREE 64)  30 mL Oral BID  . hydrOXYzine  12.5 mg Oral TID  . latanoprost  1 drop Both Eyes QHS  . levothyroxine  25 mcg Oral QAC breakfast  . lidocaine  1 application Topical Q M,W,F-HD  . metoprolol succinate  25 mg Oral Daily  . multivitamin  1 tablet Oral QHS  . multivitamin-lutein  1 capsule Oral Daily  . sevelamer carbonate  800 mg Oral TID WC  . Vitamin D (Ergocalciferol)  50,000 Units Oral Q Mon   acetaminophen **OR** acetaminophen, guaiFENesin-dextromethorphan, hydrocortisone cream, LORazepam, ondansetron **OR** ondansetron (ZOFRAN) IV, traMADol  Assessment/ Plan:  [name] is a 84 y.o. white female with end stage renal disease on hemodialysis, coronary artery disease, pacemaker, Meniere's, congestive heart failure, peripheral vascular disease  MWF [facility] permcath/left AVF EDW 48.5kg.   1. End-stage renal disease:MWF. Tolerated hemodialysis treatment on Monday.  - Will attempt hemodialysis today. However if unsuccessful, will need to strongly consider withdrawal of dialysis care. Patient does have intermittent moments of lucid thought where we need to find out patient's wishes.  On  last conversation with family on Sunday, patient wanted to continue dialysis.   2. Anemia of chronic kidney disease: Hemoglobin 10.9.  - EPO as outpatient  3. Secondary hyperparathyroidism with hyperphosphatemia: PTH 788 on 8/16 - Renvela with meals.  4. Hypertension:  126/58 - at goal.  - metoprolol   Appreciate Palliative Care input.    LOS: 4 Don Giarrusso 8/28/20198:36 AM

## 2017-09-23 DIAGNOSIS — Z515 Encounter for palliative care: Secondary | ICD-10-CM

## 2017-09-23 LAB — CHOLESTEROL, BODY FLUID: Cholesterol, Fluid: 40 mg/dL

## 2017-09-23 MED ORDER — ACETAMINOPHEN 160 MG/5ML PO SOLN
650.0000 mg | Freq: Four times a day (QID) | ORAL | Status: DC | PRN
  Filled 2017-09-23: qty 20.3

## 2017-09-23 MED ORDER — HYDROMORPHONE HCL 1 MG/ML IJ SOLN: 0.5000 mg | INTRAMUSCULAR | Status: DC | PRN

## 2017-09-23 MED ORDER — DIPHENHYDRAMINE HCL 50 MG/ML IJ SOLN
12.5000 mg | Freq: Four times a day (QID) | INTRAMUSCULAR | Status: DC | PRN
  Filled 2017-09-23: qty 0.25

## 2017-09-23 MED ORDER — LORAZEPAM 2 MG/ML PO CONC: 0.5000 mg | ORAL | Status: DC | PRN

## 2017-09-23 MED ORDER — GLYCOPYRROLATE 0.2 MG/ML IJ SOLN
0.2000 mg | INTRAMUSCULAR | Status: DC | PRN
  Filled 2017-09-23: qty 1

## 2017-09-23 MED ORDER — MORPHINE SULFATE (CONCENTRATE) 10 MG/0.5ML PO SOLN: 10.0000 mg | ORAL | Status: DC | PRN

## 2017-09-23 NOTE — Progress Notes (Signed)
[facility]  ROUNDING NOTE   Subjective:   Patient confused.   Family and friends at bedside.   Brother has decided to transfer patient to Hospice.   Patient got 2 hours of hemodialysis - did not tolerate well. Patient was not being cooperative.    Objective:  Vital signs in last 24 hours:  Temp:  [97.5 F (36.4 C)-98.2 F (36.8 C)] 98 F (36.7 C) (08/29 0454) Pulse Rate:  [81-86] 83 (08/29 0454) Resp:  [15-17] 15 (08/29 0454) BP: (116-129)/(65-71) 129/67 (08/29 0454) SpO2:  [92 %-100 %] 92 % (08/29 0454) Weight:  [53.8 kg] 53.8 kg (08/28 1151)  Weight change:  Filed Weights   09/20/17 1345 09/21/17 0500 09/22/17 1151  Weight: 53.5 kg 53.5 kg 53.8 kg    Intake/Output: I/O last 3 completed shifts: In: 1890 [IV Piggyback:1890] Out: 295 [Other:295]   Intake/Output this shift:  No intake/output data recorded.  Physical Exam: General: NAD, laying in bed  Head: +hard of hearing  Eyes: Anicteric, PERRL  Neck: Supple, trachea midline  Lungs:  Clear to auscultation  Heart: Regular rate and rhythm  Abdomen:  Soft, nontender,   Extremities: no peripheral edema.  Psych +auditory hallucination, confused.   Neurologic: Moving all extremities, alert to self only  Skin: No lesions  Access: Right AVF    Basic Metabolic Panel: Recent Labs  Lab 09/17/17 0924 09/18/17 1535 09/19/17 0431 09/20/17 1017  NA 140  --  139  --   K 4.3  --  4.0  --   CL 99  --  97*  --   CO2 27  --  32  --   GLUCOSE 160*  --  99  --   BUN 62*  --  36*  --   CREATININE 5.15*  --  3.36*  --   CALCIUM 8.8*  --  8.4*  --   PHOS  --  6.6*  --  9.1*    Liver Function Tests: No results for input(s): AST, ALT, ALKPHOS, BILITOT, PROT, ALBUMIN in the last 168 hours. No results for input(s): LIPASE, AMYLASE in the last 168 hours. No results for input(s): AMMONIA in the last 168 hours.  CBC: Recent Labs  Lab 09/17/17 0924 09/19/17 0431  WBC 6.1 5.7  NEUTROABS  --  4.2  HGB  11.5* 10.9*  HCT 34.8* 32.7*  MCV 97.2 97.5  PLT 218 193    Cardiac Enzymes: Recent Labs  Lab 09/16/17 1651 09/17/17 0157 09/17/17 0924  TROPONINI 0.46* 0.76* 1.05*    BNP: Invalid input(s): POCBNP  CBG: No results for input(s): GLUCAP in the last 168 hours.  Microbiology: Results for orders placed or performed during the hospital encounter of 09/16/17  Body fluid culture     Status: None   Collection Time: 09/16/17  3:25 PM  Result Value Ref Range Status   Specimen Description   Final    PLEURAL Performed at [facility], [address] [place], [place] [address]    Special Requests   Final    NONE Performed at [facility], [address] [place], [place] [address]    Gram Stain   Final    RARE WBC PRESENT, PREDOMINANTLY MONONUCLEAR NO ORGANISMS SEEN    Culture   Final    NO GROWTH 3 DAYS Performed at [facility], [place] [address] [place], [place] [address]    Report Status 09/20/2017 FINAL  Final  Fungus Culture With Stain  Status: None (Preliminary result)   Collection Time: 09/16/17  3:25 PM  Result Value Ref Range Status   Fungus Stain Final report  Final    Comment: (NOTE) Performed At: [facility] [place], [place] [id number] Rush Farmer MD WG:[id number]    Fungus (Mycology) Culture PENDING  Incomplete   Fungal Source         Final    Comment: Lymphocytosis. If a lymphoproliferative disorder is in the clinical differential diagnosis, fresh unrefrigerated fluid for immunophenotyping by flow cytometry may be helpful in the appropriate clinical setting.        Performed at [facility], [address] [place], [place] [address]   Fungus Culture Result     Status: None   Collection Time: 09/16/17  3:25 PM  Result Value Ref Range Status   Result 1 Comment  Final    Comment: (NOTE) KOH/Calcofluor preparation:  no fungus observed. Performed At: [facility] [place], [place] [id number] Rush Farmer MD KG:[id number]     Coagulation Studies: No results for input(s): LABPROT, INR in the last 72 hours.  Urinalysis: No results for input(s): COLORURINE, LABSPEC, PHURINE, GLUCOSEU, HGBUR, BILIRUBINUR, KETONESUR, PROTEINUR, UROBILINOGEN, NITRITE, LEUKOCYTESUR in the last 72 hours.  Invalid input(s): APPERANCEUR    Imaging: No results found.   Medications:    . lidocaine  1 application Topical Q M,W,F-HD   acetaminophen (TYLENOL) oral liquid 160 mg/5 mL, [DISCONTINUED] acetaminophen **OR** acetaminophen, diphenhydrAMINE, glycopyrrolate, guaiFENesin-dextromethorphan, hydrocortisone cream, HYDROmorphone (DILAUDID) injection, LORazepam, ondansetron **OR** ondansetron (ZOFRAN) IV, traMADol  Assessment/ Plan:  Sharon [name] is a 84 y.o. white female with end stage renal disease on hemodialysis, coronary artery disease, pacemaker, Meniere's, congestive heart failure, peripheral vascular disease  MWF [facility] permcath/left AVF EDW 48.5kg.   1. End-stage renal disease:MWF. 2. Anemia of chronic kidney disease:   3. Secondary hyperparathyroidism with hyperphosphatemia:  4. Hypertension:    Appreciate Palliative Care input. Discussed case with Dr. Darvin Neighbours.  Patient to be transferred to Hospice and discontinuation of hemodialysis. Family in agreement.    LOS: 5 Derold Dorsch 8/29/201911:35 AM

## 2017-09-23 NOTE — Progress Notes (Signed)
OT Cancellation Note  Patient Details [name] MRN: [id number] DOB: Mar 08, 1926   Cancelled Treatment:    Reason Eval/Treat Not Completed: Other (comment). Chart reviewed. OT consult cancelled. Pt to transfer to hospice home when bed becomes available.   Jeni Salles, MPH, MS, OTR/L ascom [phone] 09/23/17, 7:26 AM

## 2017-09-23 NOTE — Progress Notes (Signed)
Daily Progress Note   Patient [name]       Date: 09/23/2017 DOB: [birth year]  Age: 84 y.o. MRN#: [id number] Attending Physician: [name], MD Primary Care Physician: Kirk Ruths, MD Admit Date: 09/16/2017  Reason for Consultation/Follow-up: Establishing goals of care  Subjective: Patient drowsy and resting during my visit. No s/s of distress or discomfort.   GOC:  Good friend, Gay Filler, at bedside. She tells me Lu declined food and pills this morning. Gay Filler has spoken to Ed and understands the plan for no further HD and transition to comfort measure. Answered questions for Gay Filler regarding comfort and symptom management. She speaks of the blessing it has been to have Lu for so many years with ongoing dialysis. Emotional/spiritual support provided.   Length of Stay: 5  Current Medications: Scheduled Meds:  . lidocaine  1 application Topical Q M,W,F-HD    Continuous Infusions:   PRN Meds: acetaminophen (TYLENOL) oral liquid 160 mg/5 mL, [DISCONTINUED] acetaminophen **OR** acetaminophen, diphenhydrAMINE, glycopyrrolate, guaiFENesin-dextromethorphan, hydrocortisone cream, HYDROmorphone (DILAUDID) injection, LORazepam, ondansetron **OR** ondansetron (ZOFRAN) IV, traMADol  Physical Exam  Constitutional: She is easily aroused. She appears ill.  HENT:  Head: Normocephalic and atraumatic.  Pulmonary/Chest: No accessory muscle usage. No tachypnea. No respiratory distress.  Abdominal: There is no tenderness.  Neurological: She is easily aroused. She is disoriented.  Skin: Skin is warm and dry. There is cyanosis.  Cyanotic upper and lower extremities  Psychiatric: Her speech is delayed. Cognition and memory are impaired. She is inattentive.  Nursing note and vitals  reviewed.          Vital Signs: BP 129/67 (BP Location: Left Arm)   Pulse 83   Temp 98 F (36.7 C) (Oral)   Resp 15   Ht 5\' 4"  (1.626 m)   Wt 53.8 kg   SpO2 92%   BMI 20.38 kg/m  SpO2: SpO2: 92 % O2 Device: O2 Device: Nasal Cannula O2 Flow Rate: O2 Flow Rate (L/min): 6 L/min  Intake/output summary:   Intake/Output Summary (Last 24 hours) at 09/23/2017 1111 Last data filed at 09/22/2017 1151 Gross per 24 hour  Intake -  Output 295 ml  Net -295 ml   LBM: Last BM Date: (UTA- pt disoriented) Baseline Weight: Weight: 50.7 kg Most recent weight: Weight: 53.8 kg  Palliative Assessment/Data: PPS 20%   Flowsheet Rows     Most Recent Value  Intake Tab  Referral Department  Hospitalist  Unit at Time of Referral  Med/Surg Unit  Palliative Care Primary Diagnosis  Nephrology  Palliative Care Type  Return patient Palliative Care  Reason for referral  Clarify Goals of Care  Date first seen by Palliative Care  09/21/17  Clinical Assessment  Palliative Performance Scale Score  20%  Psychosocial & Spiritual Assessment  Palliative Care Outcomes  Patient/Family meeting held?  Yes  Who was at the meeting?  HCPOA/brother  Palliative Care Outcomes  Provided psychosocial or spiritual support, Counseled regarding hospice, Improved pain interventions, Improved non-pain symptom therapy, Provided end of life care assistance, Changed to focus on comfort, Clarified goals of care, ACP counseling assistance      Patient Active Problem List   Diagnosis Date Noted  . Terminal care   . Fracture of cervical spinous process, initial encounter ([place])   . Altered mental status   . Recurrent right pleural effusion 09/18/2017  . Fall 09/16/2017  . Left leg cellulitis 08/30/2017  . Elevated troponin 06/28/2017  . Swelling of limb 06/08/2017  . C. difficile diarrhea   . Palliative care encounter   . Encounter for hospice care discussion   . Protein-calorie malnutrition, severe 05/25/2017  .  HCAP (healthcare-associated pneumonia) 05/24/2017  . Complication from renal dialysis device 04/01/2017  . Displaced fracture of lateral end of right clavicle, initial encounter for closed fracture 02/16/2017  . Failure to thrive in adult 02/03/2017  . ESRD on hemodialysis ([place])   . Pleural effusion   . Palliative care by specialist   . Pulmonary embolism ([place]) 01/02/2017  . Syncope 01/01/2017  . Vision changes 08/27/2016  . Closed fracture of neck of femur ([place]) 05/26/2016  . Hip fracture ([place]) 05/10/2016  . Falls 04/27/2016  . Head injury 04/27/2016  . Age-related osteoporosis with current pathological fracture with routine healing 04/24/2016  . Recurrent falls 04/24/2016  . Mild protein-calorie malnutrition ([place]) 04/24/2016  . ESRD on dialysis ([place]) 04/10/2016  . Periprosthetic fracture around internal prosthetic left knee joint 01/26/2016  . Pressure injury of skin 12/15/2015  . Closed left subtrochanteric femur fracture ([place]) 12/15/2015  . Femur fracture, left ([place]) 12/14/2015  . Meniere disease   . Loss of weight 10/21/2015  . Clinical depression 06/20/2015  . Dysphagia 05/24/2015  . Imbalance 04/22/2015  . Chronic pain syndrome 05/22/2014  . Insomnia 03/13/2014  . Anxiety state 03/13/2014  . Chronic systolic CHF (congestive heart failure), NYHA class 3 ([place]) 01/15/2014  . OSA (obstructive sleep apnea) 01/15/2014  . Basal cell carcinoma of neck 11/14/2013  . DDD (degenerative disc disease), cervical 11/07/2013  . Cervical radiculitis 10/16/2013  . Benign essential hypertension 09/12/2013  . Memory loss 09/12/2013  . Systolic heart failure, chronic ([place]) 05/12/2013  . Goals of care, counseling/discussion 11/10/2012  . Sinus node dysfunction ([place]) 08/01/2012  . Low back pain 08/01/2012  . Cervical spine pain 05/02/2012  . Irritable bowel syndrome 11/02/2011  . Depression 04/01/2011  . Hypertension 04/01/2011    Palliative Care Assessment & Plan   Patient  Profile: 84 y.o. female  with past medical history of ESRD on hemodialysis, CAD, MI, pacemaker, DVT, CHF EF 20-25% admitted on 09/16/2017 from SNF after fall. Found to have a C7 fracture with neurosurgery recommendation for hard or soft collar if necessary. Nephrology following for ESRD. Recurrent right pleural effusion s/p 1.9L removed by thoracentesis. Repeat chest xray  shows reoccurring pleural effusion. Patient with continued acute metabolic encephalopathy with worsening dementia. Palliative medicine consultation for repeat goals of care.   Assessment: Fall Cervical fracture ESRD on hemodialysis Acute metabolic encephalopathy Right recurrent pleural effusion Elevated troponin Adult failure to thrive  Recommendations/Plan:  After further discussions with family and attending, brother/HCPOA understands futility of continued dialysis and recommendation for transition to comfort measures. We have attempted to discuss this with patient, but she continues to have altered mental status. Does not have capacity to make medical decisions.   Transition to comfort focused care.   Symptom management:  Tylenol 650mg  PO q6h prn pain/fever  Dilaudid 0.5mg  IV q3h prn pain/dyspnea/air hunger (will avoid Morphine with ESRD).  Ativan 0.5mg  IV q4h prn anxiety/agitation  Robinul 0.2mg  IV q4h prn secretions  Benadryl 12.5mg  IV q6h prn itching  Comfort feeds per patient/family request.   SW consult for residential hospice placement. Waiting on hospice bed.  Goals of Care and Additional Recommendations:  Limitations on Scope of Treatment: Full Comfort Care  Code Status: DNR/DNI   Code Status Orders  (From admission, onward)         Start     Ordered   09/16/17 0457  Do not attempt resuscitation (DNR)  Continuous    Question Answer Comment  In the event of cardiac or respiratory ARREST Do not call a "code blue"   In the event of cardiac or respiratory ARREST Do not perform Intubation, CPR,  defibrillation or ACLS   In the event of cardiac or respiratory ARREST Use medication by any route, position, wound care, and other measures to relive pain and suffering. May use oxygen, suction and manual treatment of airway obstruction as needed for comfort.      09/16/17 0457        Code Status History    Date Active Date Inactive Code Status Order ID Comments User Context   08/30/2017 1501 09/07/2017 1900 Partial Code [id number]  Gorden Harms, MD Inpatient   06/29/2017 0905 07/01/2017 2019 DNR [id number]  Dustin Flock, MD Inpatient   06/28/2017 1239 06/29/2017 0904 DNR [id number]  Henreitta Leber, MD Inpatient   05/24/2017 2258 06/01/2017 1706 DNR [id number]  Vaughan Basta, MD Inpatient   02/16/2017 1928 02/17/2017 1949 Full Code [id number]  Poggi, Marshall Cork, MD Inpatient   02/03/2017 1230 02/04/2017 1907 DNR [id number]  Nicholes Mango, MD ED   01/01/2017 2027 01/08/2017 1858 Full Code [id number]  [name], MD ED   05/12/2016 0809 05/14/2016 1439 DNR [id number]  Max Sane, MD Inpatient   05/10/2016 0506 05/12/2016 0809 Full Code [id number]  Saundra Shelling, MD Inpatient   12/15/2015 1515 12/19/2015 0159 Full Code [id number]  Oletta Cohn, DO Inpatient   12/14/2015 1613 12/15/2015 1515 Full Code [id number]  Bettey Costa, MD Inpatient    Advance Directive Documentation     Most Recent Value  Type of Advance Directive  Out of facility DNR (pink MOST or yellow form)  Pre-existing out of facility DNR order (yellow form or pink MOST form)  Yellow form placed in chart (order not valid for inpatient use)  "MOST" Form in Place?  -       Prognosis:   < 2 weeks if not days. Unable to tolerate further hemodialysis. Severe decline in cognitive, functional, and nutritional status. Recent fall with C7 fracture.   Discharge Planning:  Hospice facility  Care plan was discussed with Dr. Darvin Neighbours, Dr. Juleen China  Thank you for allowing the Palliative Medicine Team to assist in  the care of this  patient.   Time In: 1040 Time Out: 1105 Total Time 43min Prolonged Time Billed  no      Greater than 50%  of this time was spent counseling and coordinating care related to the above assessment and plan.  Ihor Dow, FNP-C Palliative Medicine Team  Phone: [phone] Fax: [phone]  Please contact Palliative Medicine Team phone at [phone] for questions and concerns.

## 2017-09-23 NOTE — Clinical Social Work Note (Signed)
CSW notified by Palliative NP that family has now decided to focus on comfort care. CSW spoke with patient's brother Sharon [name] [phone] about hospice home. Family is requesting that patient go to [place] home. CSW explained that currently there are no beds at [facility]. CSW asked brother if he would be willing to send patient back to [facility] under comfort care. Brother states that he would prefer that patient go to Hospice home. CSW notified Marcene Brawn, hospice liaison of hospice home referral. CSW will continue to follow for discharge planning.   [place], [place]

## 2017-09-23 NOTE — Progress Notes (Signed)
[place] [facility] [place] [name]    MR#:  [id number]  DATE OF BIRTH:  15-Feb-1926  SUBJECTIVE:  CHIEF COMPLAINT:   Chief Complaint  Patient presents with  . Fall  . Laceration   Confused.  Refusing diet and pills.  REVIEW OF SYSTEMS:   Review of Systems  Unable to perform ROS: Mental status change   DRUG ALLERGIES:   Allergies  Allergen Reactions  . Statins Other (See Comments)    Muscle weakness severe  . Codeine Sulfate Nausea And Vomiting  . Codeine Other (See Comments)    GI UPSET  . Effexor [Venlafaxine] Nausea Only  . Ezetimibe-Simvastatin Other (See Comments)    Muscle weakness    VITALS:  Blood pressure 129/67, pulse 83, temperature 98 F (36.7 C), temperature source Oral, resp. rate 15, height 5\' 4"  (1.626 m), weight 53.8 kg, SpO2 92 %.  PHYSICAL EXAMINATION:  GENERAL:  84 y.o.-year-old malnourished patient lying in the bed EYES: Pupils equal, round, reactive to light and accommodation. No scleral icterus. Extraocular muscles intact.  HEENT: Head atraumatic, normocephalic.  NECK:  Supple, no jugular venous distention. No thyroid enlargement, no tenderness.  LUNGS: Normal breath sounds bilaterally, no wheezing, rales,rhonchi or crepitation. No use of accessory muscles of respiration.  CARDIOVASCULAR: S1, S2 normal. No murmurs, rubs, or gallops.  ABDOMEN: Soft, nontender, nondistended. Bowel sounds present. No organomegaly or mass.  EXTREMITIES: No pedal edema.Cyanosis NEUROLOGIC: Not following instructions PSYCHIATRIC: The patient is drowsy  Physical Exam LABORATORY PANEL:   CBC Recent Labs  Lab 09/19/17 0431  WBC 5.7  HGB 10.9*  HCT 32.7*  PLT 193   ------------------------------------------------------------------------------------------------------------------  Chemistries  Recent Labs  Lab 09/19/17 0431  NA 139  K 4.0  CL 97*  CO2 32  GLUCOSE 99  BUN 36*  CREATININE 3.36*  CALCIUM  8.4*   ------------------------------------------------------------------------------------------------------------------  Cardiac Enzymes Recent Labs  Lab 09/17/17 0157 09/17/17 0924  TROPONINI 0.76* 1.05*   ------------------------------------------------------------------------------------------------------------------  RADIOLOGY:  No results found.  ASSESSMENT AND PLAN:   Active Problems:   Fall   Recurrent right pleural effusion   Fracture of cervical spinous process, initial encounter ([place])   Altered mental status   Terminal care   * Acute metabolic encephalopathy with worsening dementia * Right Pleural effusion  *Acute on chronic hypoxic respiratory failure *Elevated troponin. * Fall.  * ESRD:  * Cervical fracture *  Recent cellulitis on legs  On comfort measures  All the records are reviewed and case discussed with Care Management/Social Worker Management plans discussed with the patient, family and they are in agreement.  CODE STATUS: DNR  TOTAL TIME TAKING CARE OF THIS PATIENT: 35 minutes.   POSSIBLE D/C IN 1-2 DAYS, DEPENDING ON CLINICAL CONDITION.  Neita Carp M.D on 09/23/2017   Between 7am to 6pm - Pager - [phone]  After 6pm go to [email or web address] - password [email or web address] [place] [facility]  Office  [phone]  CC: Primary care physician; Kirk Ruths, MD  Note: This dictation was prepared with Dragon dictation along with smaller phrase technology. Any transcriptional errors that result from this process are unintentional.

## 2017-09-24 MED ORDER — LORAZEPAM 2 MG/ML PO CONC: 0.6000 mg | ORAL | 0 refills | Status: AC | PRN

## 2017-09-24 MED ORDER — MORPHINE SULFATE (CONCENTRATE) 10 MG/0.5ML PO SOLN: 10.0000 mg | ORAL | 0 refills | Status: AC | PRN

## 2017-09-24 NOTE — Progress Notes (Signed)
   09/24/17 1540  Clinical Encounter Type  Visited With Patient  Visit Type Initial;Spiritual support  Referral From Nurse  Consult/Referral To Chaplain  Spiritual Encounters  Spiritual Needs Prayer;Emotional   CH was requested to speak with Sharon [name]. She seems very confused at this time and requested that I ask God to take her out of here, she wants to die. I practiced active listening and spiritual and emotional support.

## 2017-09-24 NOTE — Progress Notes (Signed)
New referral for inpatient hospice care.  Notified team that we are currently on a wait list.  Patient referral information gathered and faxed to the office.  Will update staff once a bed is available- and will continue to follow patient throughout remainder of hospitalization through final disposition.  Dimas Aguas, RN [phone]

## 2017-09-24 NOTE — Clinical Social Work Note (Signed)
CSW spoke to Sharon [name] and [name], and patient is still on the wait list.  There are not any beds available currently.  CSW to continue to follow patient's progress throughout discharge planning.  Jones Broom. [place], MSW, [place]  09/24/2017 11:42 AM

## 2017-09-24 NOTE — Discharge Summary (Signed)
[facility] [place] [name]    MR#:  [id number]  DATE OF BIRTH:  1926/03/02  DATE OF ADMISSION:  09/16/2017 ADMITTING PHYSICIAN: Harrie Foreman, MD  DATE OF DISCHARGE: No discharge date for patient encounter.  PRIMARY CARE PHYSICIAN: Kirk Ruths, MD   ADMISSION DIAGNOSIS:  Respiratory distress [R06.03] Pleural effusion [J90] Injury of head, initial encounter [S09.90XA] Fall, initial encounter [W19.XXXA] Scalp laceration, initial encounter [S01.01XA] Fracture of cervical spinous process, initial encounter ([place]) [S12.9XXA]  DISCHARGE DIAGNOSIS:  Active Problems:   Fall   Recurrent right pleural effusion   Fracture of cervical spinous process, initial encounter ([place])   Altered mental status   Terminal care   SECONDARY DIAGNOSIS:   Past Medical History:  Diagnosis Date  . Anemia   . Anxiety   . Arthritis   . CAD (coronary artery disease)   . Cancer ([place])    skin  . Cardiomyopathy ([place])   . CHF (congestive heart failure) ([place])   . Depression   . IBS (irritable bowel syndrome) 2010  . Kidney failure July 2012   Hemodialysis 3xweek  . Kidney failure   . Meniere disease   . Meniere's disease   . Myocardial infarction ([place])   . OSA (obstructive sleep apnea)    CPAP  . Peritoneal dialysis status ([place])   . Presence of IVC filter 2019  . Presence of permanent cardiac pacemaker   . Renal insufficiency      ADMITTING HISTORY  Chief Complaint: Fall HPI: Patient with past medical history of CAD and MI status post pacemaker placement, DVT/PE status post IVC filter placement, CHF and end-stage renal disease on dialysis presents to the emergency department from the nursing home after a fall.  She does not remember the circumstances of her fall but admits that she has fallen in the past.  She reports feeling "body spasms" at times but is unsure if she has had seizures.  This encounter, the patient struck her  head and sustained a laceration.   Staple closure in the emergency department.  Head CT was negative for acute intracranial process.  She is on chronic supplemental oxygen but required increased flow due to tachypnea.  Chest x-ray showed a right side pleural effusion increased in size from previous film, and laboratory evaluation was significant for elevated troponin.  Due to loss of consciousness and elevated troponin the emergency department staff called the hospitalist service for admission.  HOSPITAL COURSE:   * Acute metabolic encephalopathy with worsening dementia * RightPleural effusion  *Acute on chronic hypoxic respiratory failure *Elevated troponin. * Fall.  *ESRD:  *Cervical fracture *  Recent cellulitis on legs   Patient was treated with antibiotics, thoracentesis.  Ongoing hemodialysis per nephrology.  Patient was also followed by cardiology and pulmonary during the hospital stay.  Was found to have worsening dementia and acute metabolic encephalopathy.  Palliative care was consulted.  Multiple family meetings held.  Her healthcare power of attorney brother and decided to stop hemodialysis due to deteriorating condition and decision made to transfer patient to the hospice home.  CONSULTS OBTAINED:  Treatment Team:  Deetta Perla, MD Anthonette Legato, MD  DRUG ALLERGIES:   Allergies  Allergen Reactions  . Statins Other (See Comments)    Muscle weakness severe  . Codeine Sulfate Nausea And Vomiting  . Codeine Other (See Comments)    GI UPSET  . Effexor [Venlafaxine] Nausea Only  . Ezetimibe-Simvastatin Other (See Comments)  Muscle weakness    DISCHARGE MEDICATIONS:   Allergies as of 09/24/2017      Reactions   Statins Other (See Comments)   Muscle weakness severe   Codeine Sulfate Nausea And Vomiting   Codeine Other (See Comments)   GI UPSET   Effexor [venlafaxine] Nausea Only   Ezetimibe-simvastatin Other (See Comments)   Muscle weakness       Medication List    STOP taking these medications   acetaminophen 325 MG tablet Commonly known as:  TYLENOL   ascorbic acid 250 MG tablet Commonly known as:  VITAMIN C   cefdinir 300 MG capsule Commonly known as:  OMNICEF   feeding supplement (PRO-STAT SUGAR FREE 64) Liqd   guaiFENesin-dextromethorphan 100-10 MG/5ML syrup Commonly known as:  ROBITUSSIN DM   hydrocortisone 2.5 % cream   hydrOXYzine 25 MG tablet Commonly known as:  ATARAX/VISTARIL   latanoprost 0.005 % ophthalmic solution Commonly known as:  XALATAN   levothyroxine 25 MCG tablet Commonly known as:  SYNTHROID, LEVOTHROID   lidocaine 4 % cream Commonly known as:  LMX   metoprolol succinate 25 MG 24 hr tablet Commonly known as:  TOPROL-XL   multivitamin Tabs tablet   ondansetron 4 MG tablet Commonly known as:  ZOFRAN   sevelamer carbonate 800 MG tablet Commonly known as:  RENVELA   traMADol 50 MG tablet Commonly known as:  ULTRAM   Vitamin D (Ergocalciferol) 50000 units Caps capsule Commonly known as:  DRISDOL     TAKE these medications   LORazepam 2 MG/ML concentrated solution Commonly known as:  ATIVAN Take 0.3 mLs (0.6 mg total) by mouth every 4 (four) hours as needed for anxiety.   morphine CONCENTRATE 10 MG/0.5ML Soln concentrated solution Take 0.5 mLs (10 mg total) by mouth every 2 (two) hours as needed for moderate pain, severe pain or shortness of breath.       Today   VITAL SIGNS:  Blood pressure 118/61, pulse 74, temperature 97.7 F (36.5 C), temperature source Oral, resp. rate 17, height 5\' 4"  (1.626 m), weight 53.8 kg, SpO2 96 %.  I/O:  No intake or output data in the 24 hours ending 09/24/17 0839  PHYSICAL EXAMINATION:  Physical Exam  GENERAL:  84 y.o.-year-old patient lying in the bed LUNGS: Normal breath sounds bilaterally CARDIOVASCULAR: S1, S2 NEUROLOGIC: Moves all 4 extremities. PSYCHIATRIC: The patient is awake and confused SKIN: Bruising, cyanosis  DATA  REVIEW:   CBC Recent Labs  Lab 09/19/17 0431  WBC 5.7  HGB 10.9*  HCT 32.7*  PLT 193    Chemistries  Recent Labs  Lab 09/19/17 0431  NA 139  K 4.0  CL 97*  CO2 32  GLUCOSE 99  BUN 36*  CREATININE 3.36*  CALCIUM 8.4*    Cardiac Enzymes Recent Labs  Lab 09/17/17 0924  TROPONINI 1.05*    Microbiology Results  Results for orders placed or performed during the hospital encounter of 09/16/17  Body fluid culture     Status: None   Collection Time: 09/16/17  3:25 PM  Result Value Ref Range Status   Specimen Description   Final    PLEURAL Performed at [facility], [address], [place], [place] [address]    Special Requests   Final    NONE Performed at [facility], [address] [place], [place] [address]    Gram Stain   Final    RARE WBC PRESENT, PREDOMINANTLY MONONUCLEAR NO ORGANISMS SEEN    Culture  Final    NO GROWTH 3 DAYS Performed at [facility], [place] [address], [place], [place] [address]    Report Status 09/20/2017 FINAL  Final  Fungus Culture With Stain     Status: None (Preliminary result)   Collection Time: 09/16/17  3:25 PM  Result Value Ref Range Status   Fungus Stain Final report  Final    Comment: (NOTE) Performed At: [facility] [place], [place] [id number] Rush Farmer MD RJ:[id number]    Fungus (Mycology) Culture PENDING  Incomplete   Fungal Source         Final    Comment: Lymphocytosis. If a lymphoproliferative disorder is in the clinical differential diagnosis, fresh unrefrigerated fluid for immunophenotyping by flow cytometry may be helpful in the appropriate clinical setting.        Performed at [facility], [address] [place], [place] [address]   Fungus Culture Result     Status: None   Collection Time: 09/16/17  3:25 PM  Result Value Ref Range Status   Result 1 Comment  Final    Comment: (NOTE) KOH/Calcofluor preparation:  no fungus  observed. Performed At: [facility] [place], [place] [id number] Rush Farmer MD UK:[id number]     RADIOLOGY:  No results found.  Follow up with PCP in 1 week.  Management plans discussed with the patient, family and they are in agreement.  CODE STATUS:     Code Status Orders  (From admission, onward)         Start     Ordered   09/16/17 0457  Do not attempt resuscitation (DNR)  Continuous    Question Answer Comment  In the event of cardiac or respiratory ARREST Do not call a "code blue"   In the event of cardiac or respiratory ARREST Do not perform Intubation, CPR, defibrillation or ACLS   In the event of cardiac or respiratory ARREST Use medication by any route, position, wound care, and other measures to relive pain and suffering. May use oxygen, suction and manual treatment of airway obstruction as needed for comfort.      09/16/17 0457        Code Status History    Date Active Date Inactive Code Status Order ID Comments User Context   08/30/2017 1501 09/07/2017 1900 Partial Code [id number]  Gorden Harms, MD Inpatient   06/29/2017 0905 07/01/2017 2019 DNR [id number]  Dustin Flock, MD Inpatient   06/28/2017 1239 06/29/2017 0904 DNR [id number]  Henreitta Leber, MD Inpatient   05/24/2017 2258 06/01/2017 1706 DNR [id number]  Vaughan Basta, MD Inpatient   02/16/2017 1928 02/17/2017 1949 Full Code [id number]  Poggi, Marshall Cork, MD Inpatient   02/03/2017 1230 02/04/2017 1907 DNR [id number]  Nicholes Mango, MD ED   01/01/2017 2027 01/08/2017 1858 Full Code [id number]  [name], MD ED   05/12/2016 0809 05/14/2016 1439 DNR [id number]  Max Sane, MD Inpatient   05/10/2016 0506 05/12/2016 0809 Full Code [id number]  Saundra Shelling, MD Inpatient   12/15/2015 1515 12/19/2015 0159 Full Code [id number]  Oletta Cohn, DO Inpatient   12/14/2015 1613 12/15/2015 1515 Full Code [id number]  Bettey Costa, MD Inpatient    Advance Directive Documentation     Most Recent Value   Type of Advance Directive  Out of facility DNR (pink MOST or yellow form)  Pre-existing out of facility DNR order (yellow form or pink MOST form)  Yellow form placed in chart (order not valid for  inpatient use)  "MOST" Form in Place?  -      TOTAL TIME TAKING CARE OF THIS PATIENT ON DAY OF DISCHARGE: more than 30 minutes.   Leia Alf Ryver Zadrozny M.D on 09/24/2017 at 8:39 AM  Between 7am to 6pm - Pager - [phone]  After 6pm go to [email or web address] - password [email or web address] [facility]  Office  [phone]  CC: Primary care physician; Kirk Ruths, MD  Note: This dictation was prepared with Dragon dictation along with smaller phrase technology. Any transcriptional errors that result from this process are unintentional.

## 2017-09-24 NOTE — Progress Notes (Signed)
Nutrition Brief Note  Chart reviewed. Pt now transitioning to comfort care.  No further nutrition interventions warranted at this time.  Please re-consult as needed.   Merla Sawka A. Christionna Poland, RD, LDN, CDE Pager: [phone] After hours Pager: [phone]  

## 2017-09-24 NOTE — Progress Notes (Signed)
Pt discharged to [facility] with much coaching to get on stretcher. Asymptomatic. Family at bedside

## 2017-09-24 NOTE — Progress Notes (Signed)
[facility] [place] [name]    MR#:  [id number]  DATE OF BIRTH:  February 12, 1926  SUBJECTIVE:  CHIEF COMPLAINT:   Chief Complaint  Patient presents with  . Fall  . Laceration   More awake but confused.  REVIEW OF SYSTEMS:   Review of Systems  Unable to perform ROS: Mental status change   DRUG ALLERGIES:   Allergies  Allergen Reactions  . Statins Other (See Comments)    Muscle weakness severe  . Codeine Sulfate Nausea And Vomiting  . Codeine Other (See Comments)    GI UPSET  . Effexor [Venlafaxine] Nausea Only  . Ezetimibe-Simvastatin Other (See Comments)    Muscle weakness    VITALS:  Blood pressure 118/61, pulse 74, temperature 97.7 F (36.5 C), temperature source Oral, resp. rate 17, height 5\' 4"  (1.626 m), weight 53.8 kg, SpO2 96 %.  PHYSICAL EXAMINATION:  GENERAL:  84 y.o.-year-old malnourished patient lying in the bed EYES: Pupils equal, round, reactive to light and accommodation. No scleral icterus. Extraocular muscles intact.  HEENT: Head atraumatic, normocephalic.  NECK:  Supple, no jugular venous distention. No thyroid enlargement, no tenderness.  LUNGS: Normal breath sounds bilaterally, no wheezing, rales,rhonchi or crepitation. No use of accessory muscles of respiration.  CARDIOVASCULAR: S1, S2 normal. No murmurs, rubs, or gallops.  ABDOMEN: Soft, nontender, nondistended. Bowel sounds present. No organomegaly or mass.  EXTREMITIES: No pedal edema.Cyanosis. NEUROLOGIC: Not following instructions. PSYCHIATRIC: The patient is drowsy.  Physical Exam LABORATORY PANEL:   CBC Recent Labs  Lab 09/19/17 0431  WBC 5.7  HGB 10.9*  HCT 32.7*  PLT 193   ------------------------------------------------------------------------------------------------------------------  Chemistries  Recent Labs  Lab 09/19/17 0431  NA 139  K 4.0  CL 97*  CO2 32  GLUCOSE 99  BUN 36*  CREATININE 3.36*  CALCIUM 8.4*    ------------------------------------------------------------------------------------------------------------------  Cardiac Enzymes No results for input(s): TROPONINI in the last 168 hours. ------------------------------------------------------------------------------------------------------------------  RADIOLOGY:  No results found.  ASSESSMENT AND PLAN:   Active Problems:   Fall   Recurrent right pleural effusion   Fracture of cervical spinous process, initial encounter ([place])   Altered mental status   Terminal care   * Acute metabolic encephalopathy with worsening dementia * Right Pleural effusion  *Acute on chronic hypoxic respiratory failure *Elevated troponin. * Fall.  * ESRD:  * Cervical fracture *  Recent cellulitis on legs  On comfort measures Waiting for hospice home beds  All the records are reviewed and case discussed with Care Management/Social Worker Management plans discussed with the patient, family and they are in agreement.  CODE STATUS: DNR  TOTAL TIME TAKING CARE OF THIS PATIENT: 35 minutes.   POSSIBLE D/C IN 1-2 DAYS, DEPENDING ON CLINICAL CONDITION.  Neita Carp M.D on 09/24/2017   Between 7am to 6pm - Pager - [phone]  After 6pm go to [email or web address] - password [email or web address] [facility]  Office  [phone]  CC: Primary care physician; Kirk Ruths, MD  Note: This dictation was prepared with Dragon dictation along with smaller phrase technology. Any transcriptional errors that result from this process are unintentional.

## 2017-09-24 NOTE — Clinical Social Work Note (Signed)
CSW received phone call from [place] [facility], and they have a bed available for patient to go tonight.  CSW updated patient's brother Percell Miller, physician, and bedside nurse.  CSW prepared discharge packet for patient, Marcene Brawn will call report and schedule EMS pickup for 8:30pm tonight.  Jones Broom. Norval Morton, MSW, [place]  09/24/2017 4:49 PM

## 2017-09-24 NOTE — Progress Notes (Signed)
Follow up on new referral for hospice home from yesterday who is currently on the hospice home wait list.  No family is at the bedside.  Called and spoke with patient's niece, Sharon [name] and she notified me to call and speak with Sharon [name], patient's brother and also patient's HCPOA.  Called and spoke with Sharon [name] to let him know we should have a hospice home bed for his sister either tonight or first thing in the morning.  He will come to the hospice home around 1500 today to meet with Sharon [name] SW to complete admission paperwork.  Will continue to follow through final disposition.  Sharon Aguas RN

## 2017-10-26 DEATH — deceased

## 2017-11-16 LAB — ACID FAST SMEAR (AFB, MYCOBACTERIA): Acid Fast Smear: NEGATIVE

## 2017-11-16 LAB — ACID FAST CULTURE WITH REFLEXED SENSITIVITIES (MYCOBACTERIA): Acid Fast Culture: NEGATIVE

## 2017-11-16 LAB — FUNGUS CULTURE WITH STAIN

## 2017-11-16 LAB — FUNGUS CULTURE RESULT

## 2017-11-16 LAB — ACID FAST SMEAR (AFB)

## 2017-11-16 LAB — FUNGAL ORGANISM REFLEX

## 2019-04-08 IMAGING — CR DG SHOULDER 2+V*R*
3 series · 3 of 3 positions shown · non-contrast
Comparison: None.

CLINICAL DATA: Pain following fall

EXAM:
RIGHT SHOULDER - 2+ VIEW

[shoulder grashey]
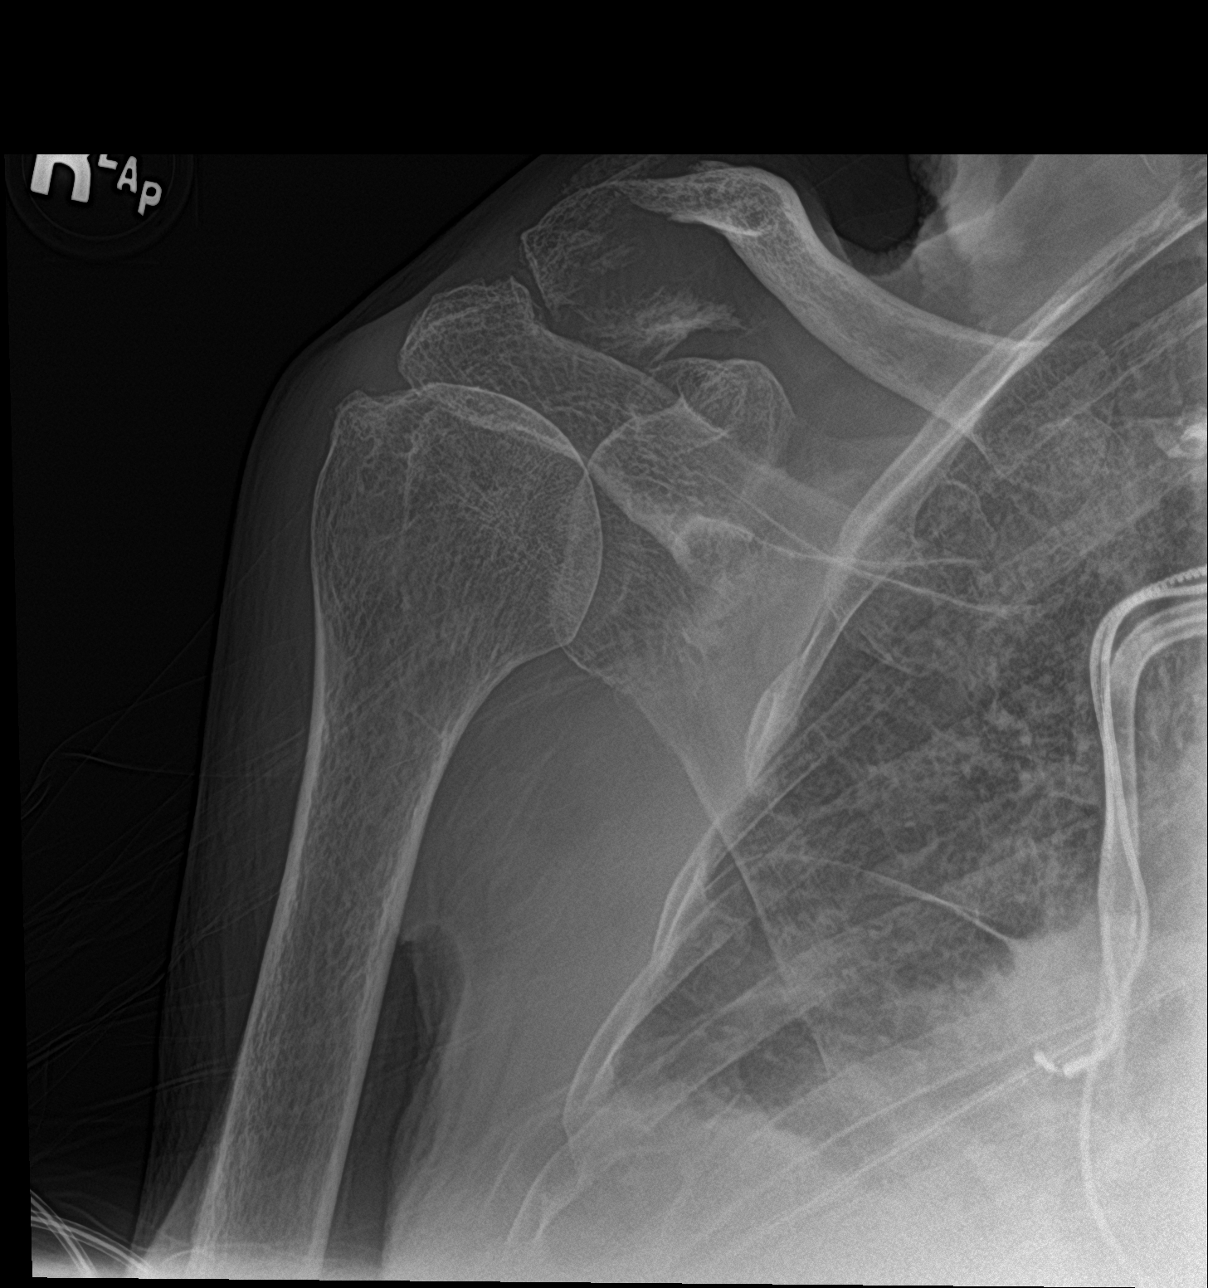

[shoulder y view]
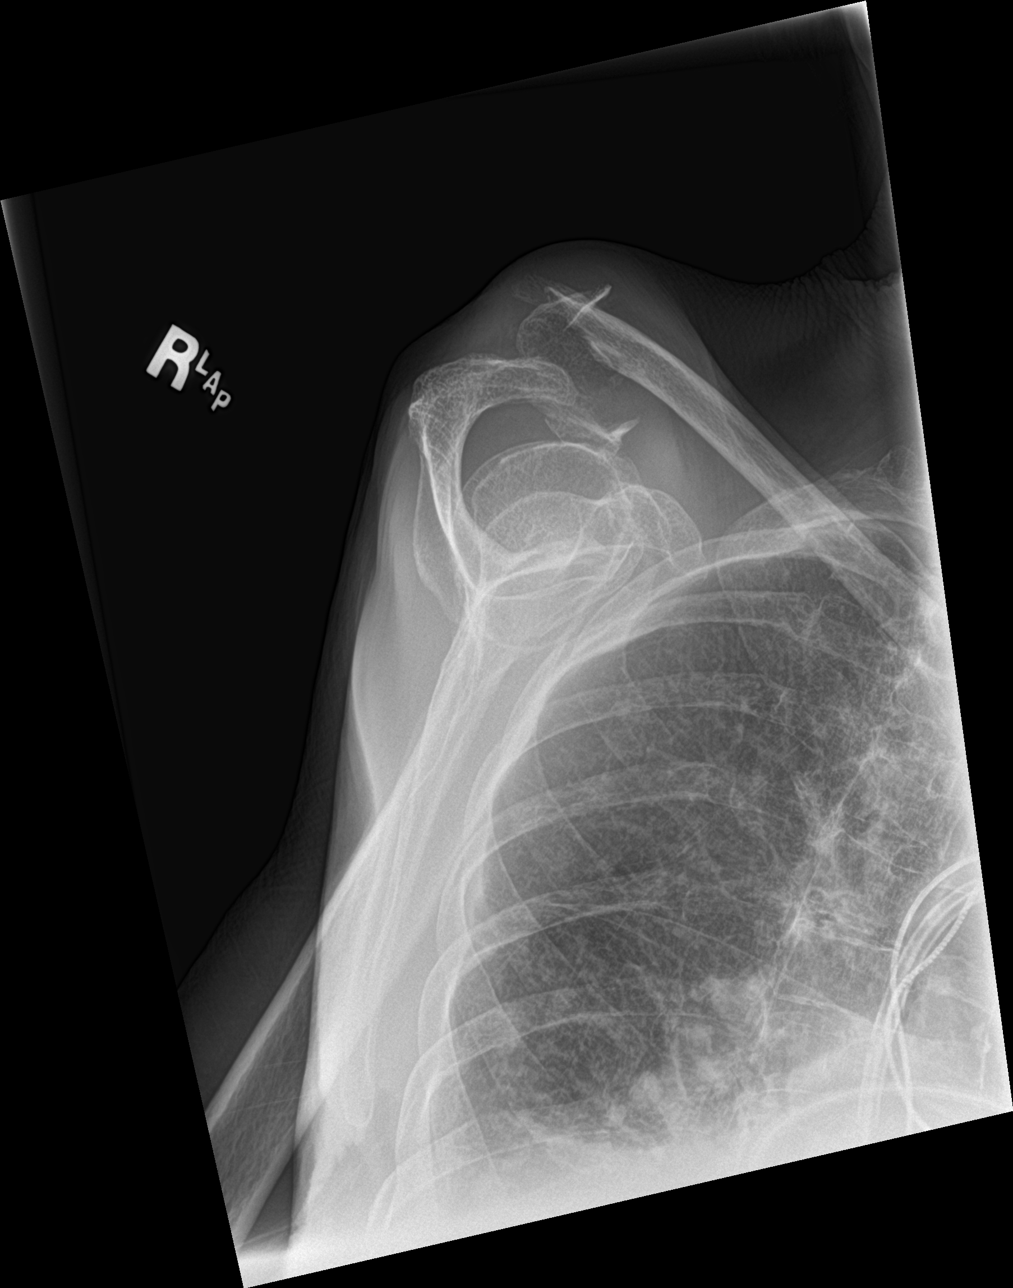

[shoulder axillary]
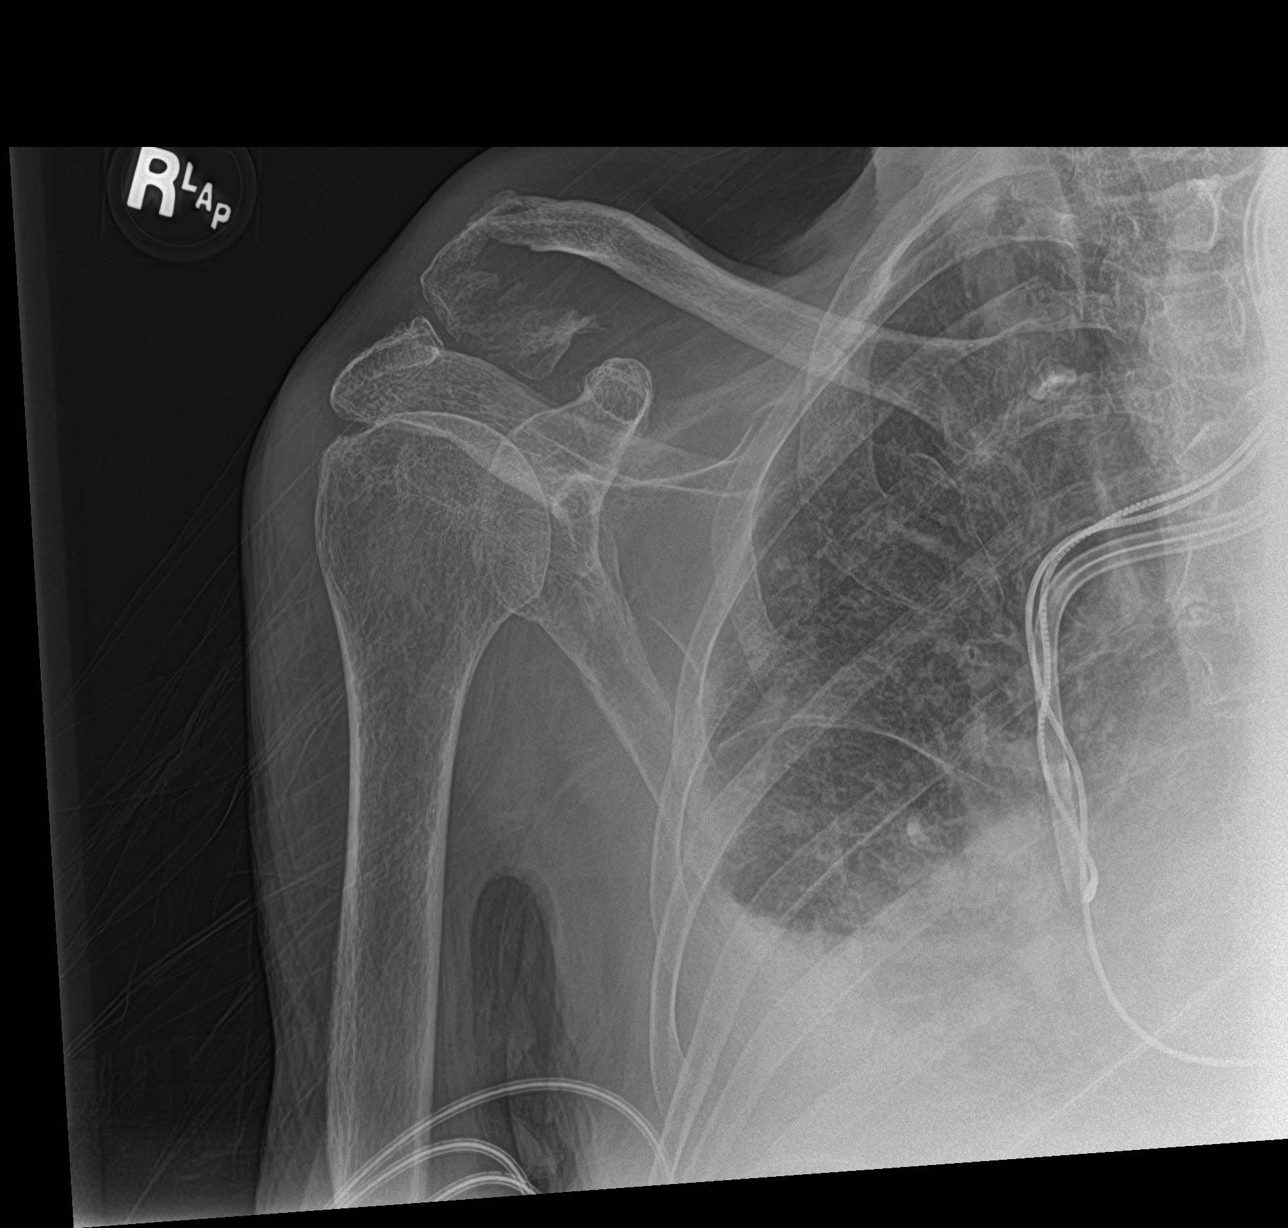

[3 of 3 positions shown; findings below may reference images not displayed]

FINDINGS: Frontal, shallow oblique, and Y scapular images were obtained. There
is a comminuted fracture of the distal clavicle with displaced
fracture fragments in this area. There appears to be a degree of
coracoclavicular separation without appreciable acromioclavicular
separation. No frank dislocation. No other fracture evident. There
is mild generalized osteoarthritic change. Bones appear
osteoporotic. There is a fairly small right pleural effusion
evident.
IMPRESSION: Comminuted fracture of the distal right clavicle with displacement
of fracture fragments. Coracoclavicular separation noted. Mild
generalized osteoarthritic change. No dislocation.

Fairly small right pleural effusion.

## 2019-04-08 IMAGING — CT CT CERVICAL SPINE W/O CM
3 of 5 series · 11 of 33 positions shown, 12 images · non-contrast
Comparison: CT head 01/01/2017. CT head and cervical spine
10/11/2016.

CLINICAL DATA: Patient fell out of bed. No report of loss of
consciousness. Neck pain. RIGHT forehead hematoma. LEFT forehead
hematoma reportedly old.

EXAM:
CT HEAD WITHOUT CONTRAST
CT CERVICAL SPINE WITHOUT CONTRAST
TECHNIQUE: Multidetector CT imaging of the head and cervical spine was
performed following the standard protocol without intravenous
contrast. Multiplanar CT image reconstructions of the cervical spine
were also generated.

[Series 5: c spine soft · axial · 0.40mm/px · z∈[-220,-118]mm · 4 of 78 slices shown, 5 images]
[im 18/78  soft-tissue]
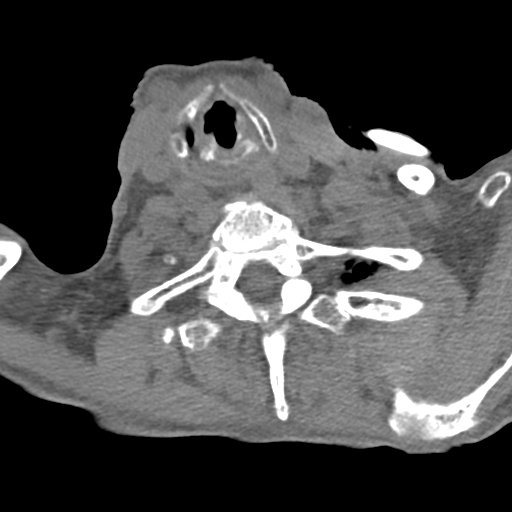
[im 18/78  bone]
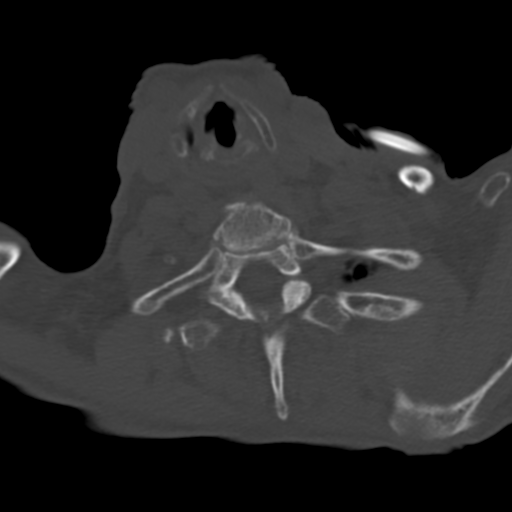
[im 35/78  bone]
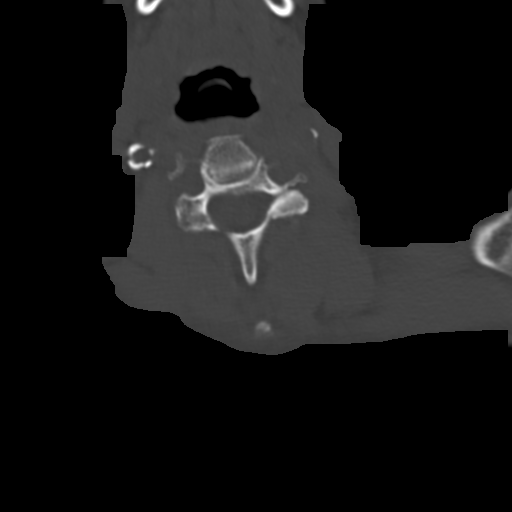
[im 52/78  bone]
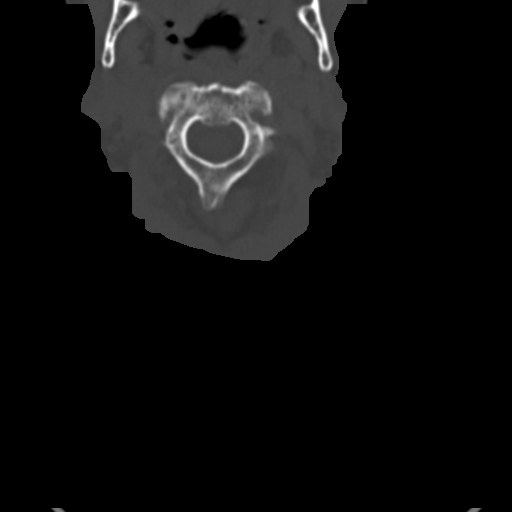
[im 69/78  bone]
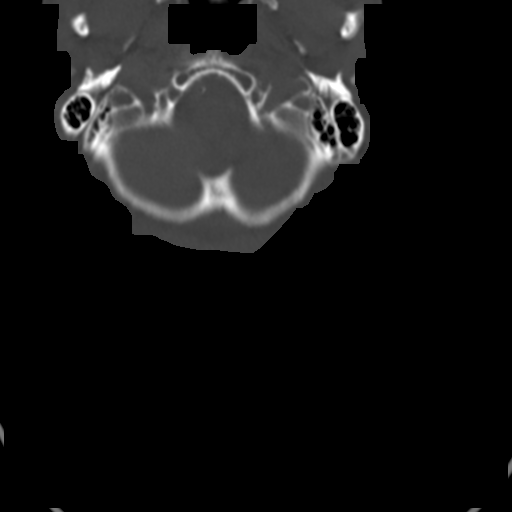

[Series 6: coronal soft tissue · coronal · 0.27mm/px · 3 of 67 slices shown]
[im 14/67  bone]
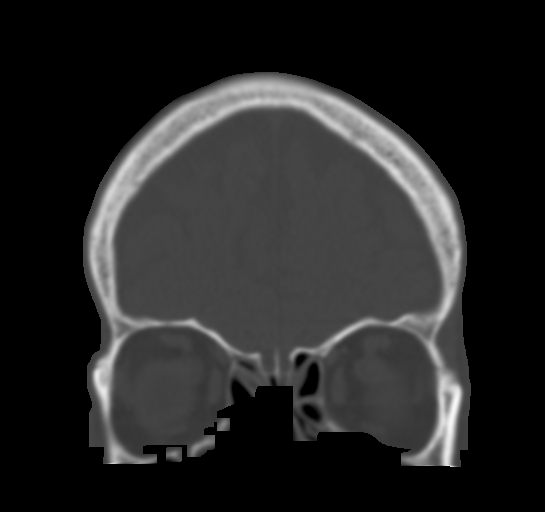
[im 27/67  bone]
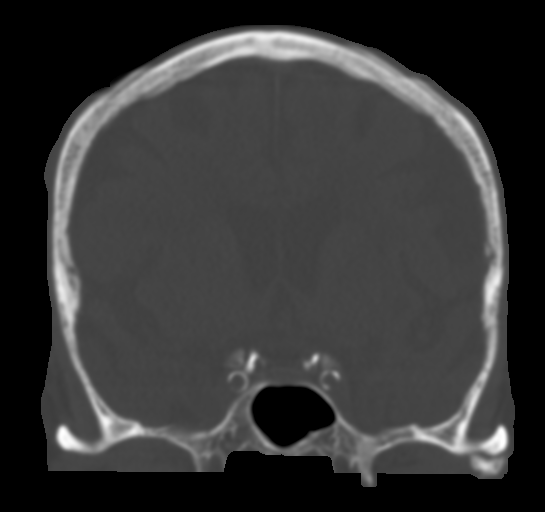
[im 40/67  bone]
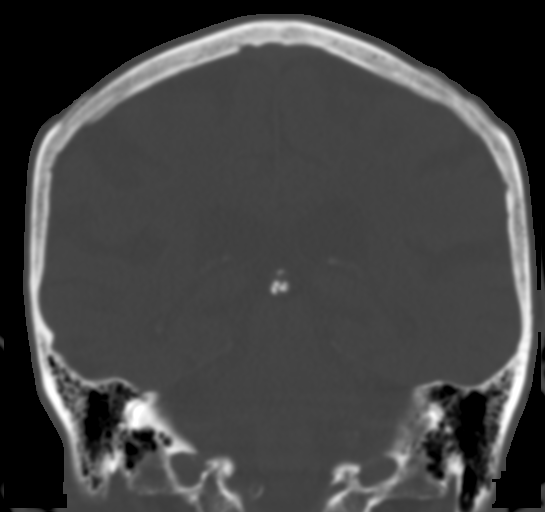

[Series 8: sagittal bone · sagittal · 0.24mm/px · 4 of 46 slices shown]
[im 10/46  bone]
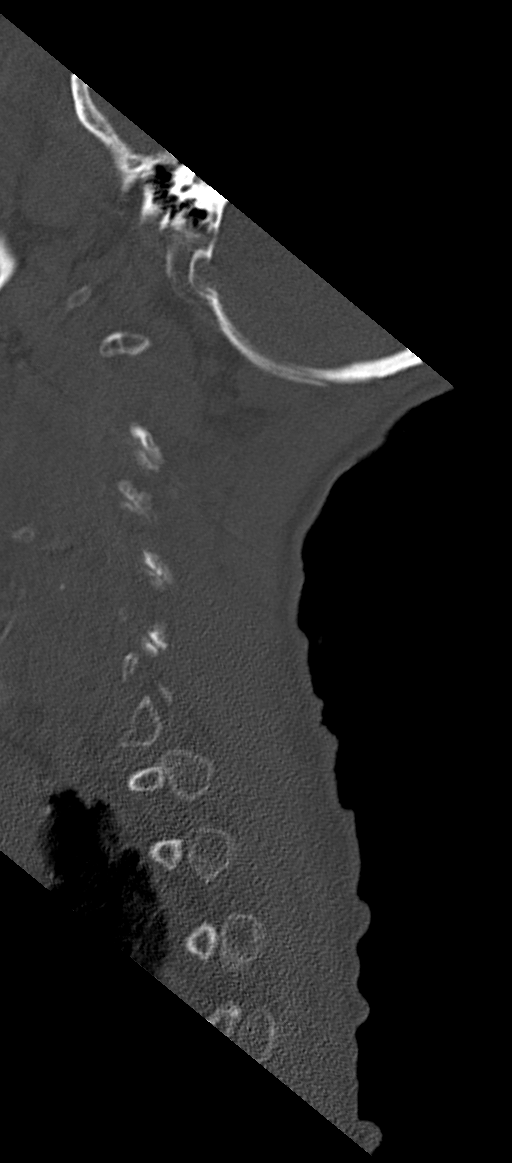
[im 19/46  bone]
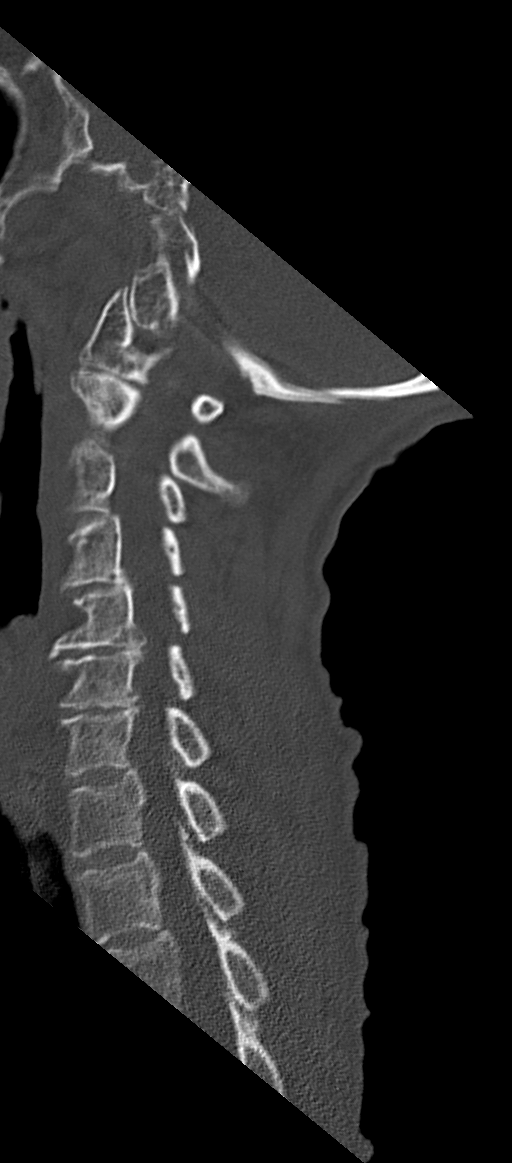
[im 28/46  bone]
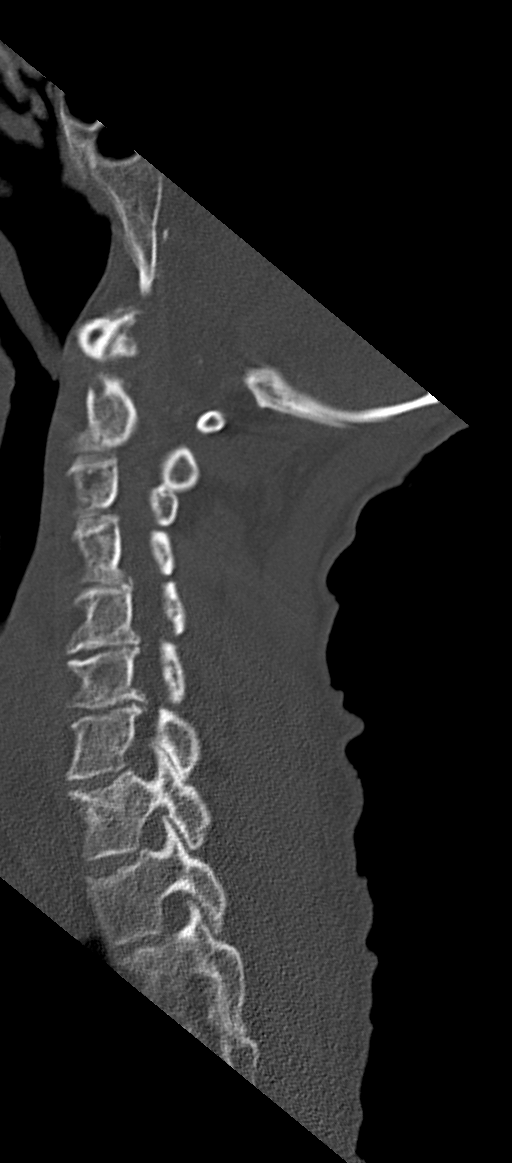
[im 37/46  bone]
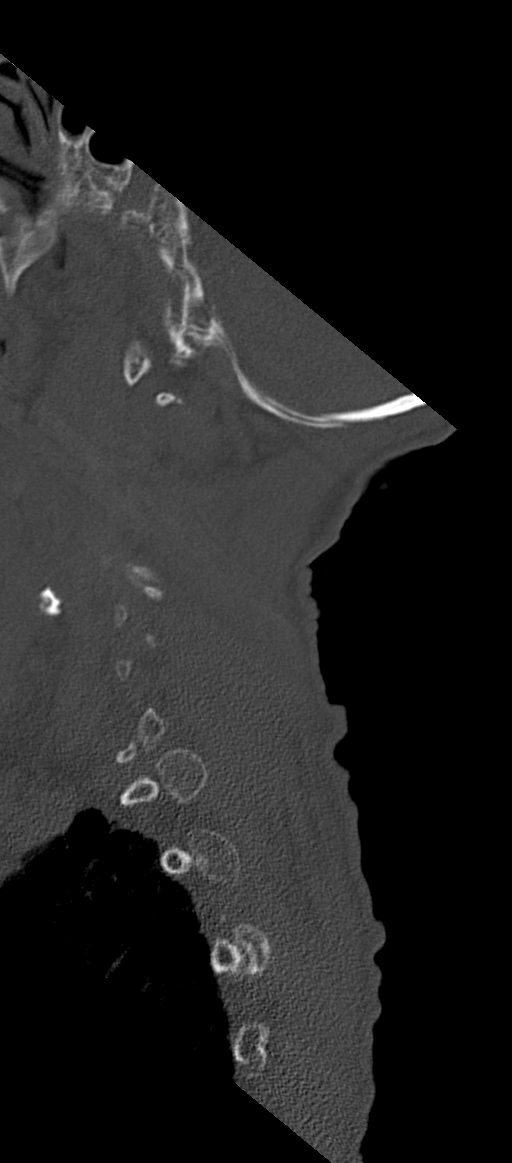

[11 of 33 positions shown; findings below may reference images not displayed]

FINDINGS: CT HEAD FINDINGS

Brain: No evidence of acute infarction, hemorrhage, hydrocephalus,
extra-axial collection or mass lesion/mass effect. Moderate atrophy,
not unexpected for age. Hypoattenuation of white matter likely small
vessel disease.

Vascular: Calcification of the cavernous internal carotid arteries
consistent with cerebrovascular atherosclerotic disease. No signs of
intracranial large vessel occlusion.

Skull: Calvarium intact.  BILATERAL frontal scalp hematomas.

Sinuses/Orbits: No layering sinus fluid. BILATERAL cataract
extraction.

Other: No mastoid fluid.

CT CERVICAL SPINE FINDINGS

Alignment: There is straightening, and slight reversal, of the
normal cervical lordosis. 2 mm anterolisthesis C4-5, 1 mm
anterolisthesis C3-4, and 1 mm retrolisthesis C6-7 all facet
mediated.

Skull base and vertebrae: No acute fracture. No primary bone lesion
or focal pathologic process.

Soft tissues and spinal canal: No prevertebral fluid or swelling. No
visible canal hematoma. Carotid atherosclerosis.

Disc levels: Multilevel spondylosis, most pronounced at C5-6 and
C6-7. BILATERAL cervical facet arthropathy.

Upper chest: Large RIGHT pleural effusion. This appears similar to
slightly smaller compared CT cervical spine from September 2016.

Other: None.
IMPRESSION: Atrophy and small vessel disease similar to priors. No skull
fracture or intracranial hemorrhage.

BILATERAL scalp hematomas, which could be related to the acute fall.

Cervical spondylosis. No cervical spine fracture or traumatic
subluxation.

RIGHT pleural effusion, which appears chronic based on review of
another cervical spine MRI from last year. This effusion however is
incompletely evaluated. A chest radiograph is warranted for further
evaluation.
